# Patient Record
Sex: Female | Born: 1935
Health system: Southern US, Community
[De-identification: ages and names within clinical notes are randomized; demographics above are authoritative.]

## PROBLEM LIST (undated history)

## (undated) DIAGNOSIS — Z8603 Personal history of neoplasm of uncertain behavior: Secondary | ICD-10-CM

## (undated) DIAGNOSIS — I8390 Asymptomatic varicose veins of unspecified lower extremity: Secondary | ICD-10-CM

## (undated) DIAGNOSIS — Z8673 Personal history of transient ischemic attack (TIA), and cerebral infarction without residual deficits: Secondary | ICD-10-CM

## (undated) DIAGNOSIS — D069 Carcinoma in situ of cervix, unspecified: Secondary | ICD-10-CM

## (undated) DIAGNOSIS — E790 Hyperuricemia without signs of inflammatory arthritis and tophaceous disease: Secondary | ICD-10-CM

## (undated) DIAGNOSIS — R296 Repeated falls: Secondary | ICD-10-CM

## (undated) DIAGNOSIS — C189 Malignant neoplasm of colon, unspecified: Secondary | ICD-10-CM

## (undated) DIAGNOSIS — C541 Malignant neoplasm of endometrium: Secondary | ICD-10-CM

## (undated) DIAGNOSIS — C50919 Malignant neoplasm of unspecified site of unspecified female breast: Secondary | ICD-10-CM

## (undated) DIAGNOSIS — M1990 Unspecified osteoarthritis, unspecified site: Secondary | ICD-10-CM

## (undated) DIAGNOSIS — C4490 Unspecified malignant neoplasm of skin, unspecified: Secondary | ICD-10-CM

## (undated) DIAGNOSIS — E119 Type 2 diabetes mellitus without complications: Secondary | ICD-10-CM

## (undated) DIAGNOSIS — M7060 Trochanteric bursitis, unspecified hip: Secondary | ICD-10-CM

## (undated) DIAGNOSIS — I10 Essential (primary) hypertension: Secondary | ICD-10-CM

## (undated) DIAGNOSIS — Z862 Personal history of diseases of the blood and blood-forming organs and certain disorders involving the immune mechanism: Secondary | ICD-10-CM

## (undated) DIAGNOSIS — I219 Acute myocardial infarction, unspecified: Secondary | ICD-10-CM

## (undated) DIAGNOSIS — D45 Polycythemia vera: Secondary | ICD-10-CM

## (undated) DIAGNOSIS — Z8781 Personal history of (healed) traumatic fracture: Secondary | ICD-10-CM

## (undated) DIAGNOSIS — I639 Cerebral infarction, unspecified: Secondary | ICD-10-CM

## (undated) DIAGNOSIS — R809 Proteinuria, unspecified: Secondary | ICD-10-CM

## (undated) HISTORY — PX: EXPLORATORY LAPAROTOMY: SUR591

## (undated) HISTORY — PX: TONSILLECTOMY: SUR1361

## (undated) HISTORY — PX: CATARACT EXTRACTION W/ INTRAOCULAR LENS IMPLANT: SHX1309

## (undated) HISTORY — PX: ABDOMINAL HYSTERECTOMY: SHX81

## (undated) HISTORY — DX: Malignant neoplasm of colon, unspecified: C18.9

## (undated) HISTORY — DX: Cerebral infarction, unspecified: I63.9

## (undated) HISTORY — DX: Malignant neoplasm of unspecified site of unspecified female breast: C50.919

---

## 2004-06-19 ENCOUNTER — Ambulatory Visit: Payer: Self-pay | Admitting: Internal Medicine

## 2005-10-01 ENCOUNTER — Ambulatory Visit: Payer: Self-pay | Admitting: Internal Medicine

## 2006-06-22 DIAGNOSIS — D45 Polycythemia vera: Secondary | ICD-10-CM | POA: Insufficient documentation

## 2006-06-22 DIAGNOSIS — I639 Cerebral infarction, unspecified: Secondary | ICD-10-CM

## 2006-06-22 HISTORY — DX: Cerebral infarction, unspecified: I63.9

## 2006-09-02 ENCOUNTER — Other Ambulatory Visit: Payer: Self-pay

## 2006-09-02 ENCOUNTER — Inpatient Hospital Stay: Payer: Self-pay | Admitting: Unknown Physician Specialty

## 2006-09-24 ENCOUNTER — Ambulatory Visit: Payer: Self-pay | Admitting: Obstetrics and Gynecology

## 2006-10-01 ENCOUNTER — Ambulatory Visit: Payer: Self-pay | Admitting: Obstetrics and Gynecology

## 2006-10-12 ENCOUNTER — Ambulatory Visit: Payer: Self-pay | Admitting: Obstetrics and Gynecology

## 2006-10-15 ENCOUNTER — Inpatient Hospital Stay: Payer: Self-pay | Admitting: Obstetrics and Gynecology

## 2006-10-17 ENCOUNTER — Ambulatory Visit: Payer: Self-pay | Admitting: Internal Medicine

## 2006-10-21 ENCOUNTER — Ambulatory Visit: Payer: Self-pay | Admitting: Internal Medicine

## 2006-10-22 ENCOUNTER — Ambulatory Visit: Payer: Self-pay | Admitting: Internal Medicine

## 2006-11-21 ENCOUNTER — Ambulatory Visit: Payer: Self-pay | Admitting: Internal Medicine

## 2006-12-21 ENCOUNTER — Ambulatory Visit: Payer: Self-pay | Admitting: Internal Medicine

## 2006-12-29 ENCOUNTER — Ambulatory Visit: Payer: Self-pay | Admitting: Obstetrics and Gynecology

## 2007-01-04 ENCOUNTER — Inpatient Hospital Stay: Payer: Self-pay | Admitting: Obstetrics and Gynecology

## 2007-01-13 ENCOUNTER — Inpatient Hospital Stay: Payer: Self-pay | Admitting: Internal Medicine

## 2007-01-13 ENCOUNTER — Other Ambulatory Visit: Payer: Self-pay

## 2007-01-21 ENCOUNTER — Ambulatory Visit: Payer: Self-pay | Admitting: Internal Medicine

## 2007-02-21 ENCOUNTER — Ambulatory Visit: Payer: Self-pay | Admitting: Internal Medicine

## 2007-03-23 ENCOUNTER — Ambulatory Visit: Payer: Self-pay | Admitting: Internal Medicine

## 2007-04-23 ENCOUNTER — Ambulatory Visit: Payer: Self-pay | Admitting: Internal Medicine

## 2007-05-23 ENCOUNTER — Ambulatory Visit: Payer: Self-pay | Admitting: Internal Medicine

## 2007-06-23 ENCOUNTER — Ambulatory Visit: Payer: Self-pay | Admitting: Internal Medicine

## 2007-07-24 ENCOUNTER — Ambulatory Visit: Payer: Self-pay | Admitting: Internal Medicine

## 2007-07-26 ENCOUNTER — Ambulatory Visit: Payer: Self-pay | Admitting: Gastroenterology

## 2007-08-21 ENCOUNTER — Ambulatory Visit: Payer: Self-pay | Admitting: Internal Medicine

## 2007-09-21 ENCOUNTER — Ambulatory Visit: Payer: Self-pay | Admitting: Internal Medicine

## 2007-10-27 ENCOUNTER — Ambulatory Visit: Payer: Self-pay | Admitting: Internal Medicine

## 2007-11-07 ENCOUNTER — Ambulatory Visit: Payer: Self-pay | Admitting: Internal Medicine

## 2007-11-21 ENCOUNTER — Ambulatory Visit: Payer: Self-pay | Admitting: Internal Medicine

## 2007-12-21 ENCOUNTER — Ambulatory Visit: Payer: Self-pay | Admitting: Internal Medicine

## 2008-01-21 ENCOUNTER — Ambulatory Visit: Payer: Self-pay | Admitting: Internal Medicine

## 2008-02-21 ENCOUNTER — Ambulatory Visit: Payer: Self-pay | Admitting: Internal Medicine

## 2008-03-22 ENCOUNTER — Ambulatory Visit: Payer: Self-pay | Admitting: Internal Medicine

## 2008-04-22 ENCOUNTER — Ambulatory Visit: Payer: Self-pay | Admitting: Internal Medicine

## 2008-05-22 ENCOUNTER — Ambulatory Visit: Payer: Self-pay | Admitting: Internal Medicine

## 2008-06-22 ENCOUNTER — Ambulatory Visit: Payer: Self-pay | Admitting: Internal Medicine

## 2008-07-23 ENCOUNTER — Ambulatory Visit: Payer: Self-pay | Admitting: Internal Medicine

## 2008-08-20 ENCOUNTER — Ambulatory Visit: Payer: Self-pay | Admitting: Internal Medicine

## 2008-09-25 ENCOUNTER — Ambulatory Visit: Payer: Self-pay | Admitting: Internal Medicine

## 2008-10-20 ENCOUNTER — Ambulatory Visit: Payer: Self-pay | Admitting: Internal Medicine

## 2008-11-20 ENCOUNTER — Ambulatory Visit: Payer: Self-pay | Admitting: Internal Medicine

## 2008-11-28 ENCOUNTER — Ambulatory Visit: Payer: Self-pay | Admitting: Internal Medicine

## 2008-12-19 ENCOUNTER — Ambulatory Visit: Payer: Self-pay | Admitting: Internal Medicine

## 2008-12-20 ENCOUNTER — Ambulatory Visit: Payer: Self-pay | Admitting: Internal Medicine

## 2009-01-29 ENCOUNTER — Ambulatory Visit: Payer: Self-pay | Admitting: Internal Medicine

## 2009-02-20 ENCOUNTER — Ambulatory Visit: Payer: Self-pay | Admitting: Internal Medicine

## 2009-03-22 ENCOUNTER — Ambulatory Visit: Payer: Self-pay | Admitting: Internal Medicine

## 2009-04-22 ENCOUNTER — Ambulatory Visit: Payer: Self-pay | Admitting: Internal Medicine

## 2009-04-23 ENCOUNTER — Ambulatory Visit: Payer: Self-pay | Admitting: Internal Medicine

## 2009-05-22 ENCOUNTER — Ambulatory Visit: Payer: Self-pay | Admitting: Internal Medicine

## 2009-06-22 ENCOUNTER — Ambulatory Visit: Payer: Self-pay | Admitting: Internal Medicine

## 2009-07-23 ENCOUNTER — Ambulatory Visit: Payer: Self-pay | Admitting: Internal Medicine

## 2009-08-27 ENCOUNTER — Ambulatory Visit: Payer: Self-pay | Admitting: Internal Medicine

## 2009-09-20 ENCOUNTER — Ambulatory Visit: Payer: Self-pay | Admitting: Internal Medicine

## 2009-10-20 ENCOUNTER — Ambulatory Visit: Payer: Self-pay | Admitting: Internal Medicine

## 2009-12-02 ENCOUNTER — Ambulatory Visit: Payer: Self-pay | Admitting: Internal Medicine

## 2009-12-31 ENCOUNTER — Ambulatory Visit: Payer: Self-pay | Admitting: Internal Medicine

## 2010-01-20 ENCOUNTER — Ambulatory Visit: Payer: Self-pay | Admitting: Internal Medicine

## 2010-02-20 ENCOUNTER — Ambulatory Visit: Payer: Self-pay | Admitting: Internal Medicine

## 2010-03-22 ENCOUNTER — Ambulatory Visit: Payer: Self-pay | Admitting: Internal Medicine

## 2010-03-26 ENCOUNTER — Ambulatory Visit: Payer: Self-pay | Admitting: Internal Medicine

## 2010-04-22 ENCOUNTER — Ambulatory Visit: Payer: Self-pay | Admitting: Internal Medicine

## 2010-05-26 ENCOUNTER — Ambulatory Visit: Payer: Self-pay | Admitting: Internal Medicine

## 2010-06-22 ENCOUNTER — Ambulatory Visit: Payer: Self-pay | Admitting: Internal Medicine

## 2010-07-28 ENCOUNTER — Ambulatory Visit: Payer: Self-pay | Admitting: Internal Medicine

## 2010-08-21 ENCOUNTER — Ambulatory Visit: Payer: Self-pay | Admitting: Internal Medicine

## 2010-10-06 ENCOUNTER — Ambulatory Visit: Payer: Self-pay | Admitting: Internal Medicine

## 2010-10-21 ENCOUNTER — Ambulatory Visit: Payer: Self-pay | Admitting: Internal Medicine

## 2010-12-08 ENCOUNTER — Ambulatory Visit: Payer: Self-pay | Admitting: Internal Medicine

## 2010-12-21 ENCOUNTER — Ambulatory Visit: Payer: Self-pay | Admitting: Internal Medicine

## 2011-02-09 ENCOUNTER — Ambulatory Visit: Payer: Self-pay | Admitting: Internal Medicine

## 2011-02-21 ENCOUNTER — Ambulatory Visit: Payer: Self-pay | Admitting: Internal Medicine

## 2011-03-20 ENCOUNTER — Ambulatory Visit: Payer: Self-pay | Admitting: Internal Medicine

## 2011-04-06 ENCOUNTER — Ambulatory Visit: Payer: Self-pay | Admitting: Internal Medicine

## 2011-04-23 ENCOUNTER — Ambulatory Visit: Payer: Self-pay | Admitting: Internal Medicine

## 2011-06-01 ENCOUNTER — Ambulatory Visit: Payer: Self-pay | Admitting: Internal Medicine

## 2011-06-23 ENCOUNTER — Ambulatory Visit: Payer: Self-pay | Admitting: Internal Medicine

## 2011-07-27 ENCOUNTER — Ambulatory Visit: Payer: Self-pay | Admitting: Internal Medicine

## 2011-07-27 LAB — CBC CANCER CENTER
Basophil #: 0 x10 3/mm (ref 0.0–0.1)
Basophil %: 0.5 %
Eosinophil #: 0.1 x10 3/mm (ref 0.0–0.7)
Eosinophil %: 1.7 %
HCT: 41.4 % (ref 35.0–47.0)
HGB: 14 g/dL (ref 12.0–16.0)
Lymphocyte #: 1.2 x10 3/mm (ref 1.0–3.6)
Lymphocyte %: 15.9 %
MCH: 30.2 pg (ref 26.0–34.0)
MCHC: 33.8 g/dL (ref 32.0–36.0)
MCV: 89 fL (ref 80–100)
Monocyte #: 0.7 x10 3/mm (ref 0.0–0.7)
Monocyte %: 8.4 %
Neutrophil #: 5.8 x10 3/mm (ref 1.4–6.5)
Neutrophil %: 73.5 %
Platelet: 499 x10 3/mm — ABNORMAL HIGH (ref 150–440)
RBC: 4.63 10*6/uL (ref 3.80–5.20)
RDW: 15.5 % — ABNORMAL HIGH (ref 11.5–14.5)
WBC: 7.8 x10 3/mm (ref 3.6–11.0)

## 2011-08-21 ENCOUNTER — Ambulatory Visit: Payer: Self-pay | Admitting: Internal Medicine

## 2011-10-02 ENCOUNTER — Ambulatory Visit: Payer: Self-pay | Admitting: Internal Medicine

## 2011-10-02 LAB — CBC CANCER CENTER
Basophil #: 0.1 x10 3/mm (ref 0.0–0.1)
Basophil %: 2.1 %
Eosinophil #: 0.3 x10 3/mm (ref 0.0–0.7)
Eosinophil %: 3.9 %
HCT: 42.7 % (ref 35.0–47.0)
HGB: 14.2 g/dL (ref 12.0–16.0)
Lymphocyte #: 1.3 x10 3/mm (ref 1.0–3.6)
Lymphocyte %: 19.8 %
MCH: 29.8 pg (ref 26.0–34.0)
MCHC: 33.3 g/dL (ref 32.0–36.0)
MCV: 90 fL (ref 80–100)
Monocyte #: 0.7 x10 3/mm (ref 0.2–0.9)
Monocyte %: 9.6 %
Neutrophil #: 4.4 x10 3/mm (ref 1.4–6.5)
Neutrophil %: 64.6 %
Platelet: 446 x10 3/mm — ABNORMAL HIGH (ref 150–440)
RBC: 4.76 10*6/uL (ref 3.80–5.20)
RDW: 15 % — ABNORMAL HIGH (ref 11.5–14.5)
WBC: 6.8 x10 3/mm (ref 3.6–11.0)

## 2011-10-02 LAB — IRON AND TIBC
Iron Bind.Cap.(Total): 436 ug/dL (ref 250–450)
Iron Saturation: 15 %
Iron: 64 ug/dL (ref 50–170)
Unbound Iron-Bind.Cap.: 372 ug/dL

## 2011-10-02 LAB — FERRITIN: Ferritin (ARMC): 9 ng/mL (ref 8–388)

## 2011-10-12 LAB — CBC CANCER CENTER
Basophil #: 0.1 x10 3/mm (ref 0.0–0.1)
Basophil %: 1.4 %
Eosinophil #: 0.2 x10 3/mm (ref 0.0–0.7)
Eosinophil %: 3.6 %
HCT: 42.8 % (ref 35.0–47.0)
HGB: 14.1 g/dL (ref 12.0–16.0)
Lymphocyte #: 1.2 x10 3/mm (ref 1.0–3.6)
Lymphocyte %: 20.8 %
MCH: 29.5 pg (ref 26.0–34.0)
MCHC: 33 g/dL (ref 32.0–36.0)
MCV: 90 fL (ref 80–100)
Monocyte #: 0.5 x10 3/mm (ref 0.2–0.9)
Monocyte %: 9.2 %
Neutrophil #: 3.8 x10 3/mm (ref 1.4–6.5)
Neutrophil %: 65 %
Platelet: 480 x10 3/mm — ABNORMAL HIGH (ref 150–440)
RBC: 4.78 10*6/uL (ref 3.80–5.20)
RDW: 15.4 % — ABNORMAL HIGH (ref 11.5–14.5)
WBC: 5.8 x10 3/mm (ref 3.6–11.0)

## 2011-10-19 LAB — CANCER CENTER HEMATOCRIT: HCT: 40.8 % (ref 35.0–47.0)

## 2011-10-21 ENCOUNTER — Ambulatory Visit: Payer: Self-pay | Admitting: Internal Medicine

## 2011-11-02 LAB — CBC CANCER CENTER
Basophil #: 0 x10 3/mm (ref 0.0–0.1)
Basophil %: 0.4 %
Eosinophil #: 0.2 x10 3/mm (ref 0.0–0.7)
Eosinophil %: 2.5 %
HCT: 42 % (ref 35.0–47.0)
HGB: 13.9 g/dL (ref 12.0–16.0)
Lymphocyte #: 1.1 x10 3/mm (ref 1.0–3.6)
Lymphocyte %: 14.4 %
MCH: 29.2 pg (ref 26.0–34.0)
MCHC: 33 g/dL (ref 32.0–36.0)
MCV: 89 fL (ref 80–100)
Monocyte #: 0.6 x10 3/mm (ref 0.2–0.9)
Monocyte %: 7.6 %
Neutrophil #: 5.6 x10 3/mm (ref 1.4–6.5)
Neutrophil %: 75.1 %
Platelet: 414 x10 3/mm (ref 150–440)
RBC: 4.74 10*6/uL (ref 3.80–5.20)
RDW: 14.6 % — ABNORMAL HIGH (ref 11.5–14.5)
WBC: 7.5 x10 3/mm (ref 3.6–11.0)

## 2011-11-21 ENCOUNTER — Ambulatory Visit: Payer: Self-pay | Admitting: Internal Medicine

## 2011-11-23 LAB — CBC CANCER CENTER
Basophil #: 0.1 x10 3/mm (ref 0.0–0.1)
Basophil %: 1.3 %
Eosinophil #: 0.2 x10 3/mm (ref 0.0–0.7)
Eosinophil %: 2.9 %
HCT: 40 % (ref 35.0–47.0)
HGB: 12.9 g/dL (ref 12.0–16.0)
Lymphocyte #: 1.2 x10 3/mm (ref 1.0–3.6)
Lymphocyte %: 19.3 %
MCH: 28.3 pg (ref 26.0–34.0)
MCHC: 32.2 g/dL (ref 32.0–36.0)
MCV: 88 fL (ref 80–100)
Monocyte #: 0.5 x10 3/mm (ref 0.2–0.9)
Monocyte %: 8 %
Neutrophil #: 4.2 x10 3/mm (ref 1.4–6.5)
Neutrophil %: 68.5 %
Platelet: 397 x10 3/mm (ref 150–440)
RBC: 4.55 10*6/uL (ref 3.80–5.20)
RDW: 14.6 % — ABNORMAL HIGH (ref 11.5–14.5)
WBC: 6.2 x10 3/mm (ref 3.6–11.0)

## 2011-12-14 LAB — CBC CANCER CENTER
Basophil #: 0.1 x10 3/mm (ref 0.0–0.1)
Basophil %: 1.4 %
Eosinophil #: 0.2 x10 3/mm (ref 0.0–0.7)
Eosinophil %: 2.8 %
HCT: 40.4 % (ref 35.0–47.0)
HGB: 13.1 g/dL (ref 12.0–16.0)
Lymphocyte #: 1 x10 3/mm (ref 1.0–3.6)
Lymphocyte %: 14.5 %
MCH: 27.9 pg (ref 26.0–34.0)
MCHC: 32.3 g/dL (ref 32.0–36.0)
MCV: 86 fL (ref 80–100)
Monocyte #: 0.5 x10 3/mm (ref 0.2–0.9)
Monocyte %: 7.9 %
Neutrophil #: 4.9 x10 3/mm (ref 1.4–6.5)
Neutrophil %: 73.4 %
Platelet: 448 x10 3/mm — ABNORMAL HIGH (ref 150–440)
RBC: 4.68 10*6/uL (ref 3.80–5.20)
RDW: 14.6 % — ABNORMAL HIGH (ref 11.5–14.5)
WBC: 6.7 x10 3/mm (ref 3.6–11.0)

## 2011-12-21 ENCOUNTER — Ambulatory Visit: Payer: Self-pay | Admitting: Internal Medicine

## 2012-01-04 ENCOUNTER — Ambulatory Visit: Payer: Self-pay | Admitting: Gastroenterology

## 2012-01-07 ENCOUNTER — Ambulatory Visit: Payer: Self-pay | Admitting: Internal Medicine

## 2012-01-07 LAB — CBC CANCER CENTER
Basophil #: 0.1 x10 3/mm (ref 0.0–0.1)
Basophil %: 1.2 %
Eosinophil #: 0.3 x10 3/mm (ref 0.0–0.7)
Eosinophil %: 4.4 %
HCT: 40.1 % (ref 35.0–47.0)
HGB: 13.1 g/dL (ref 12.0–16.0)
Lymphocyte #: 1.5 x10 3/mm (ref 1.0–3.6)
Lymphocyte %: 19.1 %
MCH: 27.7 pg (ref 26.0–34.0)
MCHC: 32.6 g/dL (ref 32.0–36.0)
MCV: 85 fL (ref 80–100)
Monocyte #: 0.7 x10 3/mm (ref 0.2–0.9)
Monocyte %: 8.4 %
Neutrophil #: 5.2 x10 3/mm (ref 1.4–6.5)
Neutrophil %: 66.9 %
Platelet: 427 x10 3/mm (ref 150–440)
RBC: 4.72 10*6/uL (ref 3.80–5.20)
RDW: 15.2 % — ABNORMAL HIGH (ref 11.5–14.5)
WBC: 7.8 x10 3/mm (ref 3.6–11.0)

## 2012-01-21 ENCOUNTER — Ambulatory Visit: Payer: Self-pay | Admitting: Internal Medicine

## 2012-01-29 LAB — CBC CANCER CENTER
Basophil #: 0.1 x10 3/mm (ref 0.0–0.1)
Basophil %: 1.2 %
Eosinophil #: 0.3 x10 3/mm (ref 0.0–0.7)
Eosinophil %: 4 %
HCT: 37.8 % (ref 35.0–47.0)
HGB: 12.7 g/dL (ref 12.0–16.0)
Lymphocyte #: 1.5 x10 3/mm (ref 1.0–3.6)
Lymphocyte %: 20.2 %
MCH: 28.1 pg (ref 26.0–34.0)
MCHC: 33.5 g/dL (ref 32.0–36.0)
MCV: 84 fL (ref 80–100)
Monocyte #: 0.7 x10 3/mm (ref 0.2–0.9)
Monocyte %: 9.3 %
Neutrophil #: 4.7 x10 3/mm (ref 1.4–6.5)
Neutrophil %: 65.3 %
Platelet: 467 x10 3/mm — ABNORMAL HIGH (ref 150–440)
RBC: 4.51 10*6/uL (ref 3.80–5.20)
RDW: 16 % — ABNORMAL HIGH (ref 11.5–14.5)
WBC: 7.2 x10 3/mm (ref 3.6–11.0)

## 2012-02-19 LAB — CBC CANCER CENTER
Basophil #: 0.1 x10 3/mm (ref 0.0–0.1)
Basophil %: 1.4 %
Eosinophil #: 0.3 x10 3/mm (ref 0.0–0.7)
Eosinophil %: 3.6 %
HCT: 41.4 % (ref 35.0–47.0)
HGB: 13.3 g/dL (ref 12.0–16.0)
Lymphocyte #: 1.3 x10 3/mm (ref 1.0–3.6)
Lymphocyte %: 18 %
MCH: 27 pg (ref 26.0–34.0)
MCHC: 32.1 g/dL (ref 32.0–36.0)
MCV: 84 fL (ref 80–100)
Monocyte #: 0.7 x10 3/mm (ref 0.2–0.9)
Monocyte %: 9.9 %
Neutrophil #: 4.9 x10 3/mm (ref 1.4–6.5)
Neutrophil %: 67.1 %
Platelet: 456 x10 3/mm — ABNORMAL HIGH (ref 150–440)
RBC: 4.92 10*6/uL (ref 3.80–5.20)
RDW: 16.2 % — ABNORMAL HIGH (ref 11.5–14.5)
WBC: 7.3 x10 3/mm (ref 3.6–11.0)

## 2012-02-19 LAB — CREATININE, SERUM
Creatinine: 1.01 mg/dL (ref 0.60–1.30)
EGFR (African American): >60
EGFR (Non-African Amer.): 53 — ABNORMAL LOW

## 2012-02-19 LAB — POTASSIUM: Potassium: 3.7 mmol/L (ref 3.5–5.1)

## 2012-02-19 LAB — CALCIUM: Calcium, Total: 9.2 mg/dL (ref 8.5–10.1)

## 2012-02-19 LAB — MAGNESIUM: Magnesium: 1.9 mg/dL

## 2012-02-21 ENCOUNTER — Ambulatory Visit: Payer: Self-pay | Admitting: Internal Medicine

## 2012-03-04 ENCOUNTER — Ambulatory Visit: Payer: Self-pay | Admitting: Internal Medicine

## 2012-03-04 LAB — CANCER CENTER HEMATOCRIT: HCT: 39.5 % (ref 35.0–47.0)

## 2012-03-16 LAB — CBC CANCER CENTER
Basophil #: 0 x10 3/mm (ref 0.0–0.1)
Basophil %: 0.3 %
Eosinophil #: 0.3 x10 3/mm (ref 0.0–0.7)
Eosinophil %: 3.8 %
HCT: 39 % (ref 35.0–47.0)
HGB: 12.5 g/dL (ref 12.0–16.0)
Lymphocyte #: 1.5 x10 3/mm (ref 1.0–3.6)
Lymphocyte %: 18.9 %
MCH: 26.7 pg (ref 26.0–34.0)
MCHC: 32.2 g/dL (ref 32.0–36.0)
MCV: 83 fL (ref 80–100)
Monocyte #: 0.8 x10 3/mm (ref 0.2–0.9)
Monocyte %: 9.2 %
Neutrophil #: 5.5 x10 3/mm (ref 1.4–6.5)
Neutrophil %: 67.8 %
Platelet: 456 x10 3/mm — ABNORMAL HIGH (ref 150–440)
RBC: 4.69 10*6/uL (ref 3.80–5.20)
RDW: 16.7 % — ABNORMAL HIGH (ref 11.5–14.5)
WBC: 8.2 x10 3/mm (ref 3.6–11.0)

## 2012-03-21 ENCOUNTER — Ambulatory Visit: Payer: Self-pay | Admitting: Internal Medicine

## 2012-03-22 ENCOUNTER — Ambulatory Visit: Payer: Self-pay | Admitting: Internal Medicine

## 2012-03-30 LAB — CBC CANCER CENTER
Basophil #: 0.1 x10 3/mm (ref 0.0–0.1)
Basophil %: 1.2 %
Eosinophil #: 0.2 x10 3/mm (ref 0.0–0.7)
Eosinophil %: 2.6 %
HCT: 38.5 % (ref 35.0–47.0)
HGB: 12.3 g/dL (ref 12.0–16.0)
Lymphocyte #: 1.7 x10 3/mm (ref 1.0–3.6)
Lymphocyte %: 21.1 %
MCH: 26.4 pg (ref 26.0–34.0)
MCHC: 31.8 g/dL — ABNORMAL LOW (ref 32.0–36.0)
MCV: 83 fL (ref 80–100)
Monocyte #: 0.8 x10 3/mm (ref 0.2–0.9)
Monocyte %: 10.4 %
Neutrophil #: 5.2 x10 3/mm (ref 1.4–6.5)
Neutrophil %: 64.7 %
Platelet: 464 x10 3/mm — ABNORMAL HIGH (ref 150–440)
RBC: 4.64 10*6/uL (ref 3.80–5.20)
RDW: 16.2 % — ABNORMAL HIGH (ref 11.5–14.5)
WBC: 8.1 x10 3/mm (ref 3.6–11.0)

## 2012-04-22 ENCOUNTER — Ambulatory Visit: Payer: Self-pay | Admitting: Internal Medicine

## 2012-04-22 LAB — CBC CANCER CENTER
Basophil #: 0.1 x10 3/mm (ref 0.0–0.1)
Basophil %: 1.7 %
Eosinophil #: 0.3 x10 3/mm (ref 0.0–0.7)
Eosinophil %: 3.7 %
HCT: 38.9 % (ref 35.0–47.0)
HGB: 12.3 g/dL (ref 12.0–16.0)
Lymphocyte #: 1.5 x10 3/mm (ref 1.0–3.6)
Lymphocyte %: 20.1 %
MCH: 26.1 pg (ref 26.0–34.0)
MCHC: 31.7 g/dL — ABNORMAL LOW (ref 32.0–36.0)
MCV: 82 fL (ref 80–100)
Monocyte #: 0.7 x10 3/mm (ref 0.2–0.9)
Monocyte %: 9.9 %
Neutrophil #: 4.8 x10 3/mm (ref 1.4–6.5)
Neutrophil %: 64.6 %
Platelet: 430 x10 3/mm (ref 150–440)
RBC: 4.72 10*6/uL (ref 3.80–5.20)
RDW: 15.7 % — ABNORMAL HIGH (ref 11.5–14.5)
WBC: 7.4 x10 3/mm (ref 3.6–11.0)

## 2012-05-20 LAB — CBC CANCER CENTER
Basophil #: 0.1 x10 3/mm (ref 0.0–0.1)
Basophil %: 1.3 %
Eosinophil #: 0.2 x10 3/mm (ref 0.0–0.7)
Eosinophil %: 3.2 %
HCT: 38.1 % (ref 35.0–47.0)
HGB: 12.4 g/dL (ref 12.0–16.0)
Lymphocyte #: 1.1 x10 3/mm (ref 1.0–3.6)
Lymphocyte %: 15.7 %
MCH: 26.4 pg (ref 26.0–34.0)
MCHC: 32.6 g/dL (ref 32.0–36.0)
MCV: 81 fL (ref 80–100)
Monocyte #: 0.5 x10 3/mm (ref 0.2–0.9)
Monocyte %: 7.5 %
Neutrophil #: 5.2 x10 3/mm (ref 1.4–6.5)
Neutrophil %: 72.3 %
Platelet: 429 x10 3/mm (ref 150–440)
RBC: 4.71 10*6/uL (ref 3.80–5.20)
RDW: 16.5 % — ABNORMAL HIGH (ref 11.5–14.5)
WBC: 7.3 x10 3/mm (ref 3.6–11.0)

## 2012-05-22 ENCOUNTER — Ambulatory Visit: Payer: Self-pay | Admitting: Internal Medicine

## 2012-06-10 LAB — CANCER CENTER HEMATOCRIT: HCT: 39.5 % (ref 35.0–47.0)

## 2012-06-22 ENCOUNTER — Ambulatory Visit: Payer: Self-pay | Admitting: Internal Medicine

## 2012-06-22 DIAGNOSIS — Z8781 Personal history of (healed) traumatic fracture: Secondary | ICD-10-CM

## 2012-06-22 HISTORY — DX: Personal history of (healed) traumatic fracture: Z87.81

## 2012-07-08 LAB — CBC CANCER CENTER
Basophil #: 0.1 x10 3/mm (ref 0.0–0.1)
Basophil %: 1.3 %
Eosinophil #: 0.3 x10 3/mm (ref 0.0–0.7)
Eosinophil %: 3.8 %
HCT: 40.3 % (ref 35.0–47.0)
HGB: 13.2 g/dL (ref 12.0–16.0)
Lymphocyte #: 1.5 x10 3/mm (ref 1.0–3.6)
Lymphocyte %: 18.4 %
MCH: 26.3 pg (ref 26.0–34.0)
MCHC: 32.6 g/dL (ref 32.0–36.0)
MCV: 81 fL (ref 80–100)
Monocyte #: 0.7 x10 3/mm (ref 0.2–0.9)
Monocyte %: 9.3 %
Neutrophil #: 5.3 x10 3/mm (ref 1.4–6.5)
Neutrophil %: 67.2 %
Platelet: 455 x10 3/mm — ABNORMAL HIGH (ref 150–440)
RBC: 5 10*6/uL (ref 3.80–5.20)
RDW: 17.1 % — ABNORMAL HIGH (ref 11.5–14.5)
WBC: 7.9 x10 3/mm (ref 3.6–11.0)

## 2012-07-23 ENCOUNTER — Ambulatory Visit: Payer: Self-pay | Admitting: Internal Medicine

## 2012-08-11 LAB — CBC CANCER CENTER
Basophil #: 0 x10 3/mm (ref 0.0–0.1)
Basophil %: 0.5 %
Eosinophil #: 0.3 x10 3/mm (ref 0.0–0.7)
Eosinophil %: 3.5 %
HCT: 38.4 % (ref 35.0–47.0)
HGB: 12.6 g/dL (ref 12.0–16.0)
Lymphocyte #: 1.5 x10 3/mm (ref 1.0–3.6)
Lymphocyte %: 16.4 %
MCH: 26 pg (ref 26.0–34.0)
MCHC: 32.7 g/dL (ref 32.0–36.0)
MCV: 80 fL (ref 80–100)
Monocyte #: 0.7 x10 3/mm (ref 0.2–0.9)
Monocyte %: 7.1 %
Neutrophil #: 6.7 x10 3/mm — ABNORMAL HIGH (ref 1.4–6.5)
Neutrophil %: 72.5 %
Platelet: 463 x10 3/mm — ABNORMAL HIGH (ref 150–440)
RBC: 4.83 10*6/uL (ref 3.80–5.20)
RDW: 16.8 % — ABNORMAL HIGH (ref 11.5–14.5)
WBC: 9.3 x10 3/mm (ref 3.6–11.0)

## 2012-08-20 ENCOUNTER — Ambulatory Visit: Payer: Self-pay | Admitting: Internal Medicine

## 2012-09-12 LAB — CBC CANCER CENTER
Basophil #: 0.1 x10 3/mm (ref 0.0–0.1)
Basophil %: 1.2 %
Eosinophil #: 0.2 x10 3/mm (ref 0.0–0.7)
Eosinophil %: 2.5 %
HCT: 39.8 % (ref 35.0–47.0)
HGB: 12.6 g/dL (ref 12.0–16.0)
Lymphocyte #: 1.4 x10 3/mm (ref 1.0–3.6)
Lymphocyte %: 17.5 %
MCH: 25.4 pg — ABNORMAL LOW (ref 26.0–34.0)
MCHC: 31.7 g/dL — ABNORMAL LOW (ref 32.0–36.0)
MCV: 80 fL (ref 80–100)
Monocyte #: 0.7 x10 3/mm (ref 0.2–0.9)
Monocyte %: 9.1 %
Neutrophil #: 5.5 x10 3/mm (ref 1.4–6.5)
Neutrophil %: 69.7 %
Platelet: 494 x10 3/mm — ABNORMAL HIGH (ref 150–440)
RBC: 4.97 10*6/uL (ref 3.80–5.20)
RDW: 16.5 % — ABNORMAL HIGH (ref 11.5–14.5)
WBC: 7.9 x10 3/mm (ref 3.6–11.0)

## 2012-09-20 ENCOUNTER — Ambulatory Visit: Payer: Self-pay | Admitting: Internal Medicine

## 2012-10-10 LAB — CBC CANCER CENTER
Basophil #: 0.1 x10 3/mm (ref 0.0–0.1)
Basophil %: 1.4 %
Eosinophil #: 0.2 x10 3/mm (ref 0.0–0.7)
Eosinophil %: 2.8 %
HCT: 39.9 % (ref 35.0–47.0)
HGB: 12.8 g/dL (ref 12.0–16.0)
Lymphocyte #: 1.2 x10 3/mm (ref 1.0–3.6)
Lymphocyte %: 13.8 %
MCH: 25.5 pg — ABNORMAL LOW (ref 26.0–34.0)
MCHC: 32 g/dL (ref 32.0–36.0)
MCV: 80 fL (ref 80–100)
Monocyte #: 0.8 x10 3/mm (ref 0.2–0.9)
Monocyte %: 9.1 %
Neutrophil #: 6.3 x10 3/mm (ref 1.4–6.5)
Neutrophil %: 72.9 %
Platelet: 456 x10 3/mm — ABNORMAL HIGH (ref 150–440)
RBC: 5.02 10*6/uL (ref 3.80–5.20)
RDW: 16.5 % — ABNORMAL HIGH (ref 11.5–14.5)
WBC: 8.7 x10 3/mm (ref 3.6–11.0)

## 2012-10-20 ENCOUNTER — Ambulatory Visit: Payer: Self-pay | Admitting: Internal Medicine

## 2012-11-07 LAB — CBC CANCER CENTER
Basophil #: 0.1 x10 3/mm (ref 0.0–0.1)
Basophil %: 1.5 %
Eosinophil #: 0.2 x10 3/mm (ref 0.0–0.7)
Eosinophil %: 2.8 %
HCT: 38.8 % (ref 35.0–47.0)
HGB: 12.6 g/dL (ref 12.0–16.0)
Lymphocyte #: 1.2 x10 3/mm (ref 1.0–3.6)
Lymphocyte %: 15.4 %
MCH: 25.8 pg — ABNORMAL LOW (ref 26.0–34.0)
MCHC: 32.6 g/dL (ref 32.0–36.0)
MCV: 79 fL — ABNORMAL LOW (ref 80–100)
Monocyte #: 0.6 x10 3/mm (ref 0.2–0.9)
Monocyte %: 8.4 %
Neutrophil #: 5.4 x10 3/mm (ref 1.4–6.5)
Neutrophil %: 71.9 %
Platelet: 444 x10 3/mm — ABNORMAL HIGH (ref 150–440)
RBC: 4.89 10*6/uL (ref 3.80–5.20)
RDW: 16.5 % — ABNORMAL HIGH (ref 11.5–14.5)
WBC: 7.5 x10 3/mm (ref 3.6–11.0)

## 2012-11-10 ENCOUNTER — Emergency Department: Payer: Self-pay | Admitting: Emergency Medicine

## 2012-11-10 LAB — CBC
HCT: 33.8 % — ABNORMAL LOW (ref 35.0–47.0)
HGB: 11 g/dL — ABNORMAL LOW (ref 12.0–16.0)
MCH: 25.7 pg — ABNORMAL LOW (ref 26.0–34.0)
MCHC: 32.5 g/dL (ref 32.0–36.0)
MCV: 79 fL — ABNORMAL LOW (ref 80–100)
Platelet: 474 10*3/uL — ABNORMAL HIGH (ref 150–440)
RBC: 4.29 10*6/uL (ref 3.80–5.20)
RDW: 16.8 % — ABNORMAL HIGH (ref 11.5–14.5)
WBC: 8.5 10*3/uL (ref 3.6–11.0)

## 2012-11-10 LAB — COMPREHENSIVE METABOLIC PANEL
Albumin: 3.5 g/dL (ref 3.4–5.0)
Alkaline Phosphatase: 54 U/L (ref 50–136)
Anion Gap: 6 — ABNORMAL LOW (ref 7–16)
BUN: 14 mg/dL (ref 7–18)
Bilirubin,Total: 0.4 mg/dL (ref 0.2–1.0)
Calcium, Total: 9.1 mg/dL (ref 8.5–10.1)
Chloride: 97 mmol/L — ABNORMAL LOW (ref 98–107)
Co2: 28 mmol/L (ref 21–32)
Creatinine: 0.86 mg/dL (ref 0.60–1.30)
EGFR (African American): >60
EGFR (Non-African Amer.): >60
Glucose: 130 mg/dL — ABNORMAL HIGH (ref 65–99)
Osmolality: 265 (ref 275–301)
Potassium: 3.9 mmol/L (ref 3.5–5.1)
SGOT(AST): 19 U/L (ref 15–37)
SGPT (ALT): 17 U/L (ref 12–78)
Sodium: 131 mmol/L — ABNORMAL LOW (ref 136–145)
Total Protein: 6.8 g/dL (ref 6.4–8.2)

## 2012-11-10 LAB — PROTIME-INR
INR: 1
Prothrombin Time: 13.4 secs (ref 11.5–14.7)

## 2012-11-10 LAB — URINALYSIS, COMPLETE
Bilirubin,UR: NEGATIVE
Blood: NEGATIVE
Glucose,UR: NEGATIVE mg/dL (ref 0–75)
Hyaline Cast: 4
Ketone: NEGATIVE
Nitrite: NEGATIVE
Ph: 5 (ref 4.5–8.0)
Protein: NEGATIVE
RBC,UR: 1 /HPF (ref 0–5)
Specific Gravity: 1.012 (ref 1.003–1.030)
Squamous Epithelial: 3
Transitional Epi: <1
WBC UR: 2 /HPF (ref 0–5)

## 2012-11-10 LAB — APTT: Activated PTT: 27.5 secs (ref 23.6–35.9)

## 2012-11-20 ENCOUNTER — Ambulatory Visit: Payer: Self-pay | Admitting: Internal Medicine

## 2012-12-19 LAB — CBC CANCER CENTER
Basophil #: 0.1 x10 3/mm (ref 0.0–0.1)
Basophil %: 1.1 %
Eosinophil #: 0.2 x10 3/mm (ref 0.0–0.7)
Eosinophil %: 2.5 %
HCT: 39.2 % (ref 35.0–47.0)
HGB: 13.3 g/dL (ref 12.0–16.0)
Lymphocyte #: 1.3 x10 3/mm (ref 1.0–3.6)
Lymphocyte %: 18 %
MCH: 27.9 pg (ref 26.0–34.0)
MCHC: 33.9 g/dL (ref 32.0–36.0)
MCV: 82 fL (ref 80–100)
Monocyte #: 0.8 x10 3/mm (ref 0.2–0.9)
Monocyte %: 10.7 %
Neutrophil #: 4.9 x10 3/mm (ref 1.4–6.5)
Neutrophil %: 67.7 %
Platelet: 513 x10 3/mm — ABNORMAL HIGH (ref 150–440)
RBC: 4.77 10*6/uL (ref 3.80–5.20)
RDW: 18.6 % — ABNORMAL HIGH (ref 11.5–14.5)
WBC: 7.3 x10 3/mm (ref 3.6–11.0)

## 2012-12-20 ENCOUNTER — Ambulatory Visit: Payer: Self-pay | Admitting: Internal Medicine

## 2013-01-27 ENCOUNTER — Ambulatory Visit: Payer: Self-pay | Admitting: Internal Medicine

## 2013-01-30 LAB — CBC CANCER CENTER
Basophil #: 0.1 x10 3/mm (ref 0.0–0.1)
Basophil %: 1.4 %
Eosinophil #: 0.2 x10 3/mm (ref 0.0–0.7)
Eosinophil %: 2.4 %
HCT: 40 % (ref 35.0–47.0)
HGB: 13.2 g/dL (ref 12.0–16.0)
Lymphocyte #: 1.6 x10 3/mm (ref 1.0–3.6)
Lymphocyte %: 18.5 %
MCH: 26.8 pg (ref 26.0–34.0)
MCHC: 33.1 g/dL (ref 32.0–36.0)
MCV: 81 fL (ref 80–100)
Monocyte #: 0.8 x10 3/mm (ref 0.2–0.9)
Monocyte %: 9.7 %
Neutrophil #: 5.7 x10 3/mm (ref 1.4–6.5)
Neutrophil %: 68 %
Platelet: 484 x10 3/mm — ABNORMAL HIGH (ref 150–440)
RBC: 4.94 10*6/uL (ref 3.80–5.20)
RDW: 17.3 % — ABNORMAL HIGH (ref 11.5–14.5)
WBC: 8.4 x10 3/mm (ref 3.6–11.0)

## 2013-02-20 ENCOUNTER — Ambulatory Visit: Payer: Self-pay | Admitting: Internal Medicine

## 2013-03-13 LAB — CBC CANCER CENTER
Basophil #: 0.2 x10 3/mm — ABNORMAL HIGH (ref 0.0–0.1)
Basophil %: 2.1 %
Eosinophil #: 0.3 x10 3/mm (ref 0.0–0.7)
Eosinophil %: 3.3 %
HCT: 41.5 % (ref 35.0–47.0)
HGB: 13.5 g/dL (ref 12.0–16.0)
Lymphocyte #: 1.6 x10 3/mm (ref 1.0–3.6)
Lymphocyte %: 17.6 %
MCH: 26.3 pg (ref 26.0–34.0)
MCHC: 32.6 g/dL (ref 32.0–36.0)
MCV: 81 fL (ref 80–100)
Monocyte #: 0.8 x10 3/mm (ref 0.2–0.9)
Monocyte %: 8.6 %
Neutrophil #: 6.3 x10 3/mm (ref 1.4–6.5)
Neutrophil %: 68.4 %
Platelet: 549 x10 3/mm — ABNORMAL HIGH (ref 150–440)
RBC: 5.13 10*6/uL (ref 3.80–5.20)
RDW: 16.4 % — ABNORMAL HIGH (ref 11.5–14.5)
WBC: 9.2 x10 3/mm (ref 3.6–11.0)

## 2013-03-22 ENCOUNTER — Ambulatory Visit: Payer: Self-pay | Admitting: Internal Medicine

## 2013-04-24 ENCOUNTER — Ambulatory Visit: Payer: Self-pay | Admitting: Internal Medicine

## 2013-04-24 LAB — CBC CANCER CENTER
Basophil #: 0 x10 3/mm (ref 0.0–0.1)
Basophil %: 0.3 %
Eosinophil #: 0.3 x10 3/mm (ref 0.0–0.7)
Eosinophil %: 2.9 %
HCT: 41.9 % (ref 35.0–47.0)
HGB: 13.4 g/dL (ref 12.0–16.0)
Lymphocyte #: 1.7 x10 3/mm (ref 1.0–3.6)
Lymphocyte %: 19.5 %
MCH: 25.9 pg — ABNORMAL LOW (ref 26.0–34.0)
MCHC: 32 g/dL (ref 32.0–36.0)
MCV: 81 fL (ref 80–100)
Monocyte #: 0.8 x10 3/mm (ref 0.2–0.9)
Monocyte %: 9.1 %
Neutrophil #: 6 x10 3/mm (ref 1.4–6.5)
Neutrophil %: 68.2 %
Platelet: 558 x10 3/mm — ABNORMAL HIGH (ref 150–440)
RBC: 5.16 10*6/uL (ref 3.80–5.20)
RDW: 16.6 % — ABNORMAL HIGH (ref 11.5–14.5)
WBC: 8.8 x10 3/mm (ref 3.6–11.0)

## 2013-04-28 ENCOUNTER — Ambulatory Visit: Payer: Self-pay | Admitting: Internal Medicine

## 2013-05-11 DIAGNOSIS — C4401 Basal cell carcinoma of skin of lip: Secondary | ICD-10-CM | POA: Insufficient documentation

## 2013-05-22 ENCOUNTER — Ambulatory Visit: Payer: Self-pay | Admitting: Internal Medicine

## 2013-07-06 ENCOUNTER — Ambulatory Visit: Payer: Self-pay | Admitting: Internal Medicine

## 2013-07-06 LAB — CBC CANCER CENTER
Basophil #: 0 x10 3/mm (ref 0.0–0.1)
Basophil %: 0.4 %
Eosinophil #: 0.3 x10 3/mm (ref 0.0–0.7)
Eosinophil %: 3.1 %
HCT: 41.6 % (ref 35.0–47.0)
HGB: 13.4 g/dL (ref 12.0–16.0)
Lymphocyte #: 1.2 x10 3/mm (ref 1.0–3.6)
Lymphocyte %: 11.9 %
MCH: 25 pg — ABNORMAL LOW (ref 26.0–34.0)
MCHC: 32.1 g/dL (ref 32.0–36.0)
MCV: 78 fL — ABNORMAL LOW (ref 80–100)
Monocyte #: 0.7 x10 3/mm (ref 0.2–0.9)
Monocyte %: 6.9 %
Neutrophil #: 7.9 x10 3/mm — ABNORMAL HIGH (ref 1.4–6.5)
Neutrophil %: 77.7 %
Platelet: 600 x10 3/mm — ABNORMAL HIGH (ref 150–440)
RBC: 5.35 10*6/uL — ABNORMAL HIGH (ref 3.80–5.20)
RDW: 16.5 % — ABNORMAL HIGH (ref 11.5–14.5)
WBC: 10.2 x10 3/mm (ref 3.6–11.0)

## 2013-07-18 LAB — OCCULT BLOOD X 1 CARD TO LAB, STOOL
Occult Blood, Feces: POSITIVE
Occult Blood, Feces: NEGATIVE
Occult Blood, Feces: NEGATIVE

## 2013-07-23 ENCOUNTER — Ambulatory Visit: Payer: Self-pay | Admitting: Internal Medicine

## 2013-07-26 LAB — CBC CANCER CENTER
Basophil #: 0.2 x10 3/mm — ABNORMAL HIGH (ref 0.0–0.1)
Basophil %: 3 %
Eosinophil #: 0.3 x10 3/mm (ref 0.0–0.7)
Eosinophil %: 3.6 %
HCT: 42.7 % (ref 35.0–47.0)
HGB: 13.5 g/dL (ref 12.0–16.0)
Lymphocyte #: 1 x10 3/mm (ref 1.0–3.6)
Lymphocyte %: 12.3 %
MCH: 25 pg — ABNORMAL LOW (ref 26.0–34.0)
MCHC: 31.5 g/dL — ABNORMAL LOW (ref 32.0–36.0)
MCV: 79 fL — ABNORMAL LOW (ref 80–100)
Monocyte #: 0.5 x10 3/mm (ref 0.2–0.9)
Monocyte %: 6.3 %
Neutrophil #: 6.2 x10 3/mm (ref 1.4–6.5)
Neutrophil %: 74.8 %
Platelet: 411 x10 3/mm (ref 150–440)
RBC: 5.38 10*6/uL — ABNORMAL HIGH (ref 3.80–5.20)
RDW: 17.9 % — ABNORMAL HIGH (ref 11.5–14.5)
WBC: 8.3 x10 3/mm (ref 3.6–11.0)

## 2013-08-09 LAB — CBC CANCER CENTER
Basophil #: 0.1 10*3/uL
Basophil %: 0.9 %
Eosinophil #: 0.2 10*3/uL
Eosinophil %: 2.6 %
HCT: 41.6 %
HGB: 13.5 g/dL
Lymphocyte %: 13.3 %
Lymphs Abs: 1.2 10*3/uL
MCH: 25.8 pg — ABNORMAL LOW
MCHC: 32.5 g/dL
MCV: 79 fL — ABNORMAL LOW
Monocyte #: 0.6 10*3/uL
Monocyte %: 6.5 %
Neutrophil #: 7.2 10*3/uL — ABNORMAL HIGH
Neutrophil %: 76.7 %
Platelet: 463 10*3/uL — ABNORMAL HIGH
RBC: 5.24 10*6/uL — ABNORMAL HIGH
RDW: 18.3 % — ABNORMAL HIGH
WBC: 9.4 10*3/uL

## 2013-08-20 ENCOUNTER — Ambulatory Visit: Payer: Self-pay | Admitting: Internal Medicine

## 2013-09-11 ENCOUNTER — Ambulatory Visit: Payer: Self-pay | Admitting: Gastroenterology

## 2013-09-13 LAB — CBC CANCER CENTER
Basophil #: 0.2 x10 3/mm — ABNORMAL HIGH (ref 0.0–0.1)
Basophil %: 1.4 %
Eosinophil #: 0.2 x10 3/mm (ref 0.0–0.7)
Eosinophil %: 2 %
HCT: 42.5 % (ref 35.0–47.0)
HGB: 13.3 g/dL (ref 12.0–16.0)
Lymphocyte #: 0.9 x10 3/mm — ABNORMAL LOW (ref 1.0–3.6)
Lymphocyte %: 8 %
MCH: 24.8 pg — ABNORMAL LOW (ref 26.0–34.0)
MCHC: 31.3 g/dL — ABNORMAL LOW (ref 32.0–36.0)
MCV: 79 fL — ABNORMAL LOW (ref 80–100)
Monocyte #: 0.8 x10 3/mm (ref 0.2–0.9)
Monocyte %: 6.8 %
Neutrophil #: 9.2 x10 3/mm — ABNORMAL HIGH (ref 1.4–6.5)
Neutrophil %: 81.8 %
Platelet: 457 x10 3/mm — ABNORMAL HIGH (ref 150–440)
RBC: 5.37 10*6/uL — ABNORMAL HIGH (ref 3.80–5.20)
RDW: 18.2 % — ABNORMAL HIGH (ref 11.5–14.5)
WBC: 11.2 x10 3/mm — ABNORMAL HIGH (ref 3.6–11.0)

## 2013-09-13 LAB — PATHOLOGY REPORT

## 2013-09-20 ENCOUNTER — Ambulatory Visit: Payer: Self-pay | Admitting: Internal Medicine

## 2013-09-27 LAB — CBC CANCER CENTER
Basophil #: 0.3 x10 3/mm — ABNORMAL HIGH (ref 0.0–0.1)
Basophil %: 3.4 %
Eosinophil #: 0.3 x10 3/mm (ref 0.0–0.7)
Eosinophil %: 3 %
HCT: 41.4 % (ref 35.0–47.0)
HGB: 12.8 g/dL (ref 12.0–16.0)
Lymphocyte #: 1.6 x10 3/mm (ref 1.0–3.6)
Lymphocyte %: 16.3 %
MCH: 24.7 pg — ABNORMAL LOW (ref 26.0–34.0)
MCHC: 31 g/dL — ABNORMAL LOW (ref 32.0–36.0)
MCV: 80 fL (ref 80–100)
Monocyte #: 0.7 x10 3/mm (ref 0.2–0.9)
Monocyte %: 7.3 %
Neutrophil #: 7 x10 3/mm — ABNORMAL HIGH (ref 1.4–6.5)
Neutrophil %: 70 %
Platelet: 527 x10 3/mm — ABNORMAL HIGH (ref 150–440)
RBC: 5.2 10*6/uL (ref 3.80–5.20)
RDW: 17.5 % — ABNORMAL HIGH (ref 11.5–14.5)
WBC: 10.1 x10 3/mm (ref 3.6–11.0)

## 2013-10-18 LAB — CBC CANCER CENTER
Basophil #: 0.2 x10 3/mm — ABNORMAL HIGH (ref 0.0–0.1)
Basophil %: 2.5 %
Eosinophil #: 0.3 x10 3/mm (ref 0.0–0.7)
Eosinophil %: 3.1 %
HCT: 41.1 % (ref 35.0–47.0)
HGB: 13.4 g/dL (ref 12.0–16.0)
Lymphocyte #: 1.4 x10 3/mm (ref 1.0–3.6)
Lymphocyte %: 14.5 %
MCH: 25.4 pg — ABNORMAL LOW (ref 26.0–34.0)
MCHC: 32.5 g/dL (ref 32.0–36.0)
MCV: 78 fL — ABNORMAL LOW (ref 80–100)
Monocyte #: 0.6 x10 3/mm (ref 0.2–0.9)
Monocyte %: 6 %
Neutrophil #: 7.3 x10 3/mm — ABNORMAL HIGH (ref 1.4–6.5)
Neutrophil %: 73.9 %
Platelet: 463 x10 3/mm — ABNORMAL HIGH (ref 150–440)
RBC: 5.27 10*6/uL — ABNORMAL HIGH (ref 3.80–5.20)
RDW: 17 % — ABNORMAL HIGH (ref 11.5–14.5)
WBC: 9.9 x10 3/mm (ref 3.6–11.0)

## 2013-10-20 ENCOUNTER — Ambulatory Visit: Payer: Self-pay | Admitting: Internal Medicine

## 2013-11-08 LAB — CBC CANCER CENTER
Basophil #: 0.2 x10 3/mm — ABNORMAL HIGH (ref 0.0–0.1)
Basophil %: 2.2 %
Eosinophil #: 0.3 x10 3/mm (ref 0.0–0.7)
Eosinophil %: 3.3 %
HCT: 38 % (ref 35.0–47.0)
HGB: 12.5 g/dL (ref 12.0–16.0)
Lymphocyte #: 1.5 x10 3/mm (ref 1.0–3.6)
Lymphocyte %: 15 %
MCH: 25.3 pg — ABNORMAL LOW (ref 26.0–34.0)
MCHC: 32.8 g/dL (ref 32.0–36.0)
MCV: 77 fL — ABNORMAL LOW (ref 80–100)
Monocyte #: 0.6 x10 3/mm (ref 0.2–0.9)
Monocyte %: 6.5 %
Neutrophil #: 7.1 x10 3/mm — ABNORMAL HIGH (ref 1.4–6.5)
Neutrophil %: 73 %
Platelet: 493 x10 3/mm — ABNORMAL HIGH (ref 150–440)
RBC: 4.93 10*6/uL (ref 3.80–5.20)
RDW: 16.6 % — ABNORMAL HIGH (ref 11.5–14.5)
WBC: 9.7 x10 3/mm (ref 3.6–11.0)

## 2013-11-20 ENCOUNTER — Ambulatory Visit: Payer: Self-pay | Admitting: Internal Medicine

## 2013-11-29 LAB — CBC CANCER CENTER
Basophil #: 0.3 x10 3/mm — ABNORMAL HIGH (ref 0.0–0.1)
Basophil %: 3.1 %
Eosinophil #: 0.3 x10 3/mm (ref 0.0–0.7)
Eosinophil %: 3.3 %
HCT: 43 % (ref 35.0–47.0)
HGB: 13.5 g/dL (ref 12.0–16.0)
Lymphocyte #: 1.5 x10 3/mm (ref 1.0–3.6)
Lymphocyte %: 14.4 %
MCH: 24.8 pg — ABNORMAL LOW (ref 26.0–34.0)
MCHC: 31.5 g/dL — ABNORMAL LOW (ref 32.0–36.0)
MCV: 79 fL — ABNORMAL LOW (ref 80–100)
Monocyte #: 0.7 x10 3/mm (ref 0.2–0.9)
Monocyte %: 7 %
Neutrophil #: 7.5 x10 3/mm — ABNORMAL HIGH (ref 1.4–6.5)
Neutrophil %: 72.2 %
Platelet: 562 x10 3/mm — ABNORMAL HIGH (ref 150–440)
RBC: 5.46 10*6/uL — ABNORMAL HIGH (ref 3.80–5.20)
RDW: 17.2 % — ABNORMAL HIGH (ref 11.5–14.5)
WBC: 10.3 x10 3/mm (ref 3.6–11.0)

## 2013-12-09 DIAGNOSIS — M7060 Trochanteric bursitis, unspecified hip: Secondary | ICD-10-CM | POA: Insufficient documentation

## 2013-12-20 ENCOUNTER — Ambulatory Visit: Payer: Self-pay | Admitting: Internal Medicine

## 2013-12-20 LAB — CBC CANCER CENTER
Basophil #: 0.2 x10 3/mm — ABNORMAL HIGH (ref 0.0–0.1)
Basophil %: 1.8 %
Eosinophil #: 0.3 x10 3/mm (ref 0.0–0.7)
Eosinophil %: 3 %
HCT: 39.8 % (ref 35.0–47.0)
HGB: 12.6 g/dL (ref 12.0–16.0)
Lymphocyte #: 1.2 x10 3/mm (ref 1.0–3.6)
Lymphocyte %: 11.5 %
MCH: 25 pg — ABNORMAL LOW (ref 26.0–34.0)
MCHC: 31.7 g/dL — ABNORMAL LOW (ref 32.0–36.0)
MCV: 79 fL — ABNORMAL LOW (ref 80–100)
Monocyte #: 0.8 x10 3/mm (ref 0.2–0.9)
Monocyte %: 7.3 %
Neutrophil #: 8 x10 3/mm — ABNORMAL HIGH (ref 1.4–6.5)
Neutrophil %: 76.4 %
Platelet: 562 x10 3/mm — ABNORMAL HIGH (ref 150–440)
RBC: 5.05 10*6/uL (ref 3.80–5.20)
RDW: 17.3 % — ABNORMAL HIGH (ref 11.5–14.5)
WBC: 10.5 x10 3/mm (ref 3.6–11.0)

## 2013-12-27 DIAGNOSIS — E119 Type 2 diabetes mellitus without complications: Secondary | ICD-10-CM | POA: Insufficient documentation

## 2014-01-17 ENCOUNTER — Other Ambulatory Visit (HOSPITAL_COMMUNITY): Payer: Self-pay | Admitting: Internal Medicine

## 2014-01-17 LAB — CBC CANCER CENTER
Basophil #: 0.2 x10 3/mm — ABNORMAL HIGH (ref 0.0–0.1)
Basophil %: 1.9 %
Eosinophil #: 0.4 x10 3/mm (ref 0.0–0.7)
Eosinophil %: 3.8 %
HCT: 43.3 % (ref 35.0–47.0)
HGB: 13.8 g/dL (ref 12.0–16.0)
Lymphocyte #: 1.3 x10 3/mm (ref 1.0–3.6)
Lymphocyte %: 12.6 %
MCH: 25.4 pg — ABNORMAL LOW (ref 26.0–34.0)
MCHC: 31.9 g/dL — ABNORMAL LOW (ref 32.0–36.0)
MCV: 80 fL (ref 80–100)
Monocyte #: 0.3 x10 3/mm (ref 0.2–0.9)
Monocyte %: 2.8 %
Neutrophil #: 8.3 x10 3/mm — ABNORMAL HIGH (ref 1.4–6.5)
Neutrophil %: 78.9 %
Platelet: 449 x10 3/mm — ABNORMAL HIGH (ref 150–440)
RBC: 5.44 10*6/uL — ABNORMAL HIGH (ref 3.80–5.20)
RDW: 17.9 % — ABNORMAL HIGH (ref 11.5–14.5)
WBC: 10.5 x10 3/mm (ref 3.6–11.0)

## 2014-01-17 LAB — BASIC METABOLIC PANEL
Anion Gap: 11 (ref 7–16)
BUN: 25 mg/dL — ABNORMAL HIGH (ref 7–18)
Calcium, Total: 9.5 mg/dL (ref 8.5–10.1)
Chloride: 99 mmol/L (ref 98–107)
Co2: 27 mmol/L (ref 21–32)
Creatinine: 1.26 mg/dL (ref 0.60–1.30)
EGFR (African American): 47 — ABNORMAL LOW
EGFR (Non-African Amer.): 40 — ABNORMAL LOW
Glucose: 164 mg/dL — ABNORMAL HIGH (ref 65–99)
Osmolality: 282 (ref 275–301)
Potassium: 4.3 mmol/L (ref 3.5–5.1)
Sodium: 137 mmol/L (ref 136–145)

## 2014-01-17 LAB — MAGNESIUM: Magnesium: 1.5 mg/dL — ABNORMAL LOW

## 2014-01-20 ENCOUNTER — Ambulatory Visit: Payer: Self-pay | Admitting: Internal Medicine

## 2014-02-14 LAB — CBC CANCER CENTER
Basophil #: 0.1 x10 3/mm (ref 0.0–0.1)
Basophil %: 1.1 %
Eosinophil #: 0.3 x10 3/mm (ref 0.0–0.7)
Eosinophil %: 2.5 %
HCT: 41.7 % (ref 35.0–47.0)
HGB: 13 g/dL (ref 12.0–16.0)
Lymphocyte #: 1.2 x10 3/mm (ref 1.0–3.6)
Lymphocyte %: 11.1 %
MCH: 24.9 pg — ABNORMAL LOW (ref 26.0–34.0)
MCHC: 31.2 g/dL — ABNORMAL LOW (ref 32.0–36.0)
MCV: 80 fL (ref 80–100)
Monocyte #: 0.6 x10 3/mm (ref 0.2–0.9)
Monocyte %: 6.2 %
Neutrophil #: 8.3 x10 3/mm — ABNORMAL HIGH (ref 1.4–6.5)
Neutrophil %: 79.1 %
Platelet: 407 x10 3/mm (ref 150–440)
RBC: 5.21 10*6/uL — ABNORMAL HIGH (ref 3.80–5.20)
RDW: 17.3 % — ABNORMAL HIGH (ref 11.5–14.5)
WBC: 10.4 x10 3/mm (ref 3.6–11.0)

## 2014-02-14 LAB — MAGNESIUM: Magnesium: 1.5 mg/dL — ABNORMAL LOW

## 2014-02-14 LAB — SODIUM: Sodium: 138 mmol/L (ref 136–145)

## 2014-02-20 ENCOUNTER — Ambulatory Visit: Payer: Self-pay | Admitting: Internal Medicine

## 2014-03-07 LAB — CBC CANCER CENTER
Basophil #: 0 x10 3/mm (ref 0.0–0.1)
Basophil %: 0.2 %
Eosinophil #: 0.4 x10 3/mm (ref 0.0–0.7)
Eosinophil %: 3.1 %
HCT: 42 % (ref 35.0–47.0)
HGB: 13.1 g/dL (ref 12.0–16.0)
Lymphocyte #: 1.4 x10 3/mm (ref 1.0–3.6)
Lymphocyte %: 11.9 %
MCH: 24.8 pg — ABNORMAL LOW (ref 26.0–34.0)
MCHC: 31.2 g/dL — ABNORMAL LOW (ref 32.0–36.0)
MCV: 80 fL (ref 80–100)
Monocyte #: 0.6 x10 3/mm (ref 0.2–0.9)
Monocyte %: 5.3 %
Neutrophil #: 9.4 x10 3/mm — ABNORMAL HIGH (ref 1.4–6.5)
Neutrophil %: 79.5 %
Platelet: 529 x10 3/mm — ABNORMAL HIGH (ref 150–440)
RBC: 5.29 10*6/uL — ABNORMAL HIGH (ref 3.80–5.20)
RDW: 17.3 % — ABNORMAL HIGH (ref 11.5–14.5)
WBC: 11.9 x10 3/mm — ABNORMAL HIGH (ref 3.6–11.0)

## 2014-03-21 ENCOUNTER — Other Ambulatory Visit (HOSPITAL_COMMUNITY): Payer: Self-pay | Admitting: Internal Medicine

## 2014-03-22 ENCOUNTER — Ambulatory Visit: Payer: Self-pay | Admitting: Internal Medicine

## 2014-03-28 LAB — CBC CANCER CENTER
Basophil #: 0 x10 3/mm (ref 0.0–0.1)
Basophil %: 0.3 %
Eosinophil #: 0.3 x10 3/mm (ref 0.0–0.7)
Eosinophil %: 3.3 %
HCT: 40.7 % (ref 35.0–47.0)
HGB: 12.7 g/dL (ref 12.0–16.0)
Lymphocyte #: 1.6 x10 3/mm (ref 1.0–3.6)
Lymphocyte %: 14.7 %
MCH: 24.8 pg — ABNORMAL LOW (ref 26.0–34.0)
MCHC: 31.2 g/dL — ABNORMAL LOW (ref 32.0–36.0)
MCV: 80 fL (ref 80–100)
Monocyte #: 0.6 x10 3/mm (ref 0.2–0.9)
Monocyte %: 5.7 %
Neutrophil #: 8.1 x10 3/mm — ABNORMAL HIGH (ref 1.4–6.5)
Neutrophil %: 76 %
Platelet: 421 x10 3/mm (ref 150–440)
RBC: 5.12 10*6/uL (ref 3.80–5.20)
RDW: 17 % — ABNORMAL HIGH (ref 11.5–14.5)
WBC: 10.6 x10 3/mm (ref 3.6–11.0)

## 2014-04-18 LAB — CBC CANCER CENTER
Basophil #: 0.2 x10 3/mm — ABNORMAL HIGH (ref 0.0–0.1)
Basophil %: 2.4 %
Eosinophil #: 0.3 x10 3/mm (ref 0.0–0.7)
Eosinophil %: 2.8 %
HCT: 38.9 % (ref 35.0–47.0)
HGB: 12.1 g/dL (ref 12.0–16.0)
Lymphocyte #: 1.4 x10 3/mm (ref 1.0–3.6)
Lymphocyte %: 13.9 %
MCH: 24.9 pg — ABNORMAL LOW (ref 26.0–34.0)
MCHC: 31.1 g/dL — ABNORMAL LOW (ref 32.0–36.0)
MCV: 80 fL (ref 80–100)
Monocyte #: 0.5 x10 3/mm (ref 0.2–0.9)
Monocyte %: 4.8 %
Neutrophil #: 7.7 x10 3/mm — ABNORMAL HIGH (ref 1.4–6.5)
Neutrophil %: 76.1 %
Platelet: 401 x10 3/mm (ref 150–440)
RBC: 4.85 10*6/uL (ref 3.80–5.20)
RDW: 17 % — ABNORMAL HIGH (ref 11.5–14.5)
WBC: 10.2 x10 3/mm (ref 3.6–11.0)

## 2014-04-22 ENCOUNTER — Ambulatory Visit: Payer: Self-pay | Admitting: Internal Medicine

## 2014-05-09 LAB — CBC CANCER CENTER
Basophil #: 0.3 x10 3/mm — ABNORMAL HIGH (ref 0.0–0.1)
Basophil %: 2.8 %
Eosinophil #: 0.4 x10 3/mm (ref 0.0–0.7)
Eosinophil %: 3.7 %
HCT: 40.4 % (ref 35.0–47.0)
HGB: 12.5 g/dL (ref 12.0–16.0)
Lymphocyte #: 1.5 x10 3/mm (ref 1.0–3.6)
Lymphocyte %: 14.4 %
MCH: 24.6 pg — ABNORMAL LOW (ref 26.0–34.0)
MCHC: 31 g/dL — ABNORMAL LOW (ref 32.0–36.0)
MCV: 80 fL (ref 80–100)
Monocyte #: 0.7 x10 3/mm (ref 0.2–0.9)
Monocyte %: 6.5 %
Neutrophil #: 7.7 x10 3/mm — ABNORMAL HIGH (ref 1.4–6.5)
Neutrophil %: 72.6 %
Platelet: 427 x10 3/mm (ref 150–440)
RBC: 5.08 10*6/uL (ref 3.80–5.20)
RDW: 16.6 % — ABNORMAL HIGH (ref 11.5–14.5)
WBC: 10.6 x10 3/mm (ref 3.6–11.0)

## 2014-05-22 ENCOUNTER — Ambulatory Visit: Payer: Self-pay | Admitting: Internal Medicine

## 2014-06-13 DIAGNOSIS — E790 Hyperuricemia without signs of inflammatory arthritis and tophaceous disease: Secondary | ICD-10-CM | POA: Insufficient documentation

## 2014-06-13 DIAGNOSIS — D751 Secondary polycythemia: Secondary | ICD-10-CM | POA: Insufficient documentation

## 2014-06-25 ENCOUNTER — Ambulatory Visit: Payer: Self-pay | Admitting: Internal Medicine

## 2014-06-25 LAB — CBC CANCER CENTER
Basophil #: 0.2 x10 3/mm — ABNORMAL HIGH (ref 0.0–0.1)
Basophil %: 2 %
Eosinophil #: 0.4 x10 3/mm (ref 0.0–0.7)
Eosinophil %: 3.9 %
HCT: 38.7 % (ref 35.0–47.0)
HGB: 12.1 g/dL (ref 12.0–16.0)
Lymphocyte #: 1.5 x10 3/mm (ref 1.0–3.6)
Lymphocyte %: 14.5 %
MCH: 23.9 pg — ABNORMAL LOW (ref 26.0–34.0)
MCHC: 31.2 g/dL — ABNORMAL LOW (ref 32.0–36.0)
MCV: 77 fL — ABNORMAL LOW (ref 80–100)
Monocyte #: 0.8 x10 3/mm (ref 0.2–0.9)
Monocyte %: 7.3 %
Neutrophil #: 7.4 x10 3/mm — ABNORMAL HIGH (ref 1.4–6.5)
Neutrophil %: 72.3 %
Platelet: 505 x10 3/mm — ABNORMAL HIGH (ref 150–440)
RBC: 5.06 10*6/uL (ref 3.80–5.20)
RDW: 17 % — ABNORMAL HIGH (ref 11.5–14.5)
WBC: 10.3 x10 3/mm (ref 3.6–11.0)

## 2014-07-03 ENCOUNTER — Ambulatory Visit: Payer: Self-pay | Admitting: Internal Medicine

## 2014-07-23 ENCOUNTER — Ambulatory Visit: Payer: Self-pay | Admitting: Internal Medicine

## 2014-07-23 LAB — CBC CANCER CENTER
Basophil #: 0.2 x10 3/mm — ABNORMAL HIGH (ref 0.0–0.1)
Basophil %: 2 %
Eosinophil #: 0.4 x10 3/mm (ref 0.0–0.7)
Eosinophil %: 3.6 %
HCT: 38.4 % (ref 35.0–47.0)
HGB: 12.1 g/dL (ref 12.0–16.0)
Lymphocyte #: 1.5 x10 3/mm (ref 1.0–3.6)
Lymphocyte %: 13.9 %
MCH: 23.9 pg — ABNORMAL LOW (ref 26.0–34.0)
MCHC: 31.5 g/dL — ABNORMAL LOW (ref 32.0–36.0)
MCV: 76 fL — ABNORMAL LOW (ref 80–100)
Monocyte #: 0.7 x10 3/mm (ref 0.2–0.9)
Monocyte %: 6.5 %
Neutrophil #: 7.8 x10 3/mm — ABNORMAL HIGH (ref 1.4–6.5)
Neutrophil %: 74 %
Platelet: 468 x10 3/mm — ABNORMAL HIGH (ref 150–440)
RBC: 5.06 10*6/uL (ref 3.80–5.20)
RDW: 17.1 % — ABNORMAL HIGH (ref 11.5–14.5)
WBC: 10.6 x10 3/mm (ref 3.6–11.0)

## 2014-08-21 ENCOUNTER — Ambulatory Visit
Admit: 2014-08-21 | Disposition: A | Discharge status: Home / Self Care | Payer: Self-pay | Attending: Internal Medicine | Admitting: Internal Medicine

## 2014-09-17 LAB — CBC CANCER CENTER
Basophil #: 0.4 x10 3/mm — ABNORMAL HIGH (ref 0.0–0.1)
Basophil %: 3.5 %
Eosinophil #: 0.4 x10 3/mm (ref 0.0–0.7)
Eosinophil %: 3.6 %
HCT: 38.2 % (ref 35.0–47.0)
HGB: 12.1 g/dL (ref 12.0–16.0)
Lymphocyte #: 1.4 x10 3/mm (ref 1.0–3.6)
Lymphocyte %: 13.3 %
MCH: 23.9 pg — ABNORMAL LOW (ref 26.0–34.0)
MCHC: 31.5 g/dL — ABNORMAL LOW (ref 32.0–36.0)
MCV: 76 fL — ABNORMAL LOW (ref 80–100)
Monocyte #: 0.6 x10 3/mm (ref 0.2–0.9)
Monocyte %: 6 %
Neutrophil #: 7.5 x10 3/mm — ABNORMAL HIGH (ref 1.4–6.5)
Neutrophil %: 73.6 %
Platelet: 475 x10 3/mm — ABNORMAL HIGH (ref 150–440)
RBC: 5.04 10*6/uL (ref 3.80–5.20)
RDW: 18 % — ABNORMAL HIGH (ref 11.5–14.5)
WBC: 10.2 x10 3/mm (ref 3.6–11.0)

## 2014-09-21 ENCOUNTER — Ambulatory Visit
Admit: 2014-09-21 | Disposition: A | Discharge status: Home / Self Care | Payer: Self-pay | Attending: Internal Medicine | Admitting: Internal Medicine

## 2014-09-24 DIAGNOSIS — R809 Proteinuria, unspecified: Secondary | ICD-10-CM | POA: Insufficient documentation

## 2014-10-15 LAB — CBC CANCER CENTER
Basophil #: 0.3 x10 3/mm — ABNORMAL HIGH (ref 0.0–0.1)
Basophil %: 2.5 %
Eosinophil #: 0.4 x10 3/mm (ref 0.0–0.7)
Eosinophil %: 3.7 %
HCT: 39.4 % (ref 35.0–47.0)
HGB: 12.2 g/dL (ref 12.0–16.0)
Lymphocyte #: 1.5 x10 3/mm (ref 1.0–3.6)
Lymphocyte %: 13.9 %
MCH: 23.5 pg — ABNORMAL LOW (ref 26.0–34.0)
MCHC: 30.9 g/dL — ABNORMAL LOW (ref 32.0–36.0)
MCV: 76 fL — ABNORMAL LOW (ref 80–100)
Monocyte #: 0.7 x10 3/mm (ref 0.2–0.9)
Monocyte %: 6.4 %
Neutrophil #: 7.8 x10 3/mm — ABNORMAL HIGH (ref 1.4–6.5)
Neutrophil %: 73.5 %
Platelet: 476 x10 3/mm — ABNORMAL HIGH (ref 150–440)
RBC: 5.18 10*6/uL (ref 3.80–5.20)
RDW: 17.7 % — ABNORMAL HIGH (ref 11.5–14.5)
WBC: 10.6 x10 3/mm (ref 3.6–11.0)

## 2014-11-26 ENCOUNTER — Inpatient Hospital Stay: Payer: Medicare Other | Attending: Family Medicine

## 2014-11-26 DIAGNOSIS — D45 Polycythemia vera: Secondary | ICD-10-CM | POA: Insufficient documentation

## 2014-11-26 DIAGNOSIS — D75839 Thrombocytosis, unspecified: Secondary | ICD-10-CM

## 2014-11-26 DIAGNOSIS — D473 Essential (hemorrhagic) thrombocythemia: Secondary | ICD-10-CM

## 2014-11-26 LAB — CBC WITH DIFFERENTIAL/PLATELET
Basophils Absolute: 0.3 10*3/uL — ABNORMAL HIGH (ref 0–0.1)
Basophils Relative: 3 %
Eosinophils Absolute: 0.4 10*3/uL (ref 0–0.7)
Eosinophils Relative: 3 %
HCT: 41.7 % (ref 35.0–47.0)
Hemoglobin: 12.8 g/dL (ref 12.0–16.0)
Lymphocytes Relative: 14 %
Lymphs Abs: 1.5 10*3/uL (ref 1.0–3.6)
MCH: 23.3 pg — ABNORMAL LOW (ref 26.0–34.0)
MCHC: 30.7 g/dL — ABNORMAL LOW (ref 32.0–36.0)
MCV: 75.8 fL — ABNORMAL LOW (ref 80.0–100.0)
Monocytes Absolute: 0.6 10*3/uL (ref 0.2–0.9)
Monocytes Relative: 5 %
Neutro Abs: 8.3 10*3/uL — ABNORMAL HIGH (ref 1.4–6.5)
Neutrophils Relative %: 75 %
Platelets: 428 10*3/uL (ref 150–440)
RBC: 5.5 MIL/uL — ABNORMAL HIGH (ref 3.80–5.20)
RDW: 18 % — ABNORMAL HIGH (ref 11.5–14.5)
WBC: 11.1 10*3/uL — ABNORMAL HIGH (ref 3.6–11.0)

## 2014-12-20 ENCOUNTER — Emergency Department: Payer: Medicare Other

## 2014-12-20 ENCOUNTER — Observation Stay: Payer: Medicare Other

## 2014-12-20 ENCOUNTER — Inpatient Hospital Stay
Admission: EM | Admit: 2014-12-20 | Discharge: 2014-12-23 | DRG: 066 | Disposition: A | Discharge status: Skilled Nursing Facility | Payer: Medicare Other | Attending: Internal Medicine | Admitting: Internal Medicine

## 2014-12-20 ENCOUNTER — Observation Stay
Admit: 2014-12-20 | Discharge: 2014-12-20 | Disposition: A | Discharge status: Home / Self Care | Payer: Medicare Other | Attending: Internal Medicine | Admitting: Internal Medicine

## 2014-12-20 ENCOUNTER — Encounter: Payer: Self-pay | Admitting: Emergency Medicine

## 2014-12-20 DIAGNOSIS — Z8541 Personal history of malignant neoplasm of cervix uteri: Secondary | ICD-10-CM

## 2014-12-20 DIAGNOSIS — E119 Type 2 diabetes mellitus without complications: Secondary | ICD-10-CM | POA: Diagnosis present

## 2014-12-20 DIAGNOSIS — R4781 Slurred speech: Secondary | ICD-10-CM | POA: Diagnosis present

## 2014-12-20 DIAGNOSIS — Z87891 Personal history of nicotine dependence: Secondary | ICD-10-CM

## 2014-12-20 DIAGNOSIS — Z7982 Long term (current) use of aspirin: Secondary | ICD-10-CM

## 2014-12-20 DIAGNOSIS — Z8249 Family history of ischemic heart disease and other diseases of the circulatory system: Secondary | ICD-10-CM

## 2014-12-20 DIAGNOSIS — I159 Secondary hypertension, unspecified: Secondary | ICD-10-CM

## 2014-12-20 DIAGNOSIS — I639 Cerebral infarction, unspecified: Principal | ICD-10-CM | POA: Diagnosis present

## 2014-12-20 DIAGNOSIS — E785 Hyperlipidemia, unspecified: Secondary | ICD-10-CM | POA: Diagnosis present

## 2014-12-20 DIAGNOSIS — G459 Transient cerebral ischemic attack, unspecified: Secondary | ICD-10-CM | POA: Diagnosis present

## 2014-12-20 HISTORY — DX: Type 2 diabetes mellitus without complications: E11.9

## 2014-12-20 HISTORY — DX: Carcinoma in situ of cervix, unspecified: D06.9

## 2014-12-20 HISTORY — DX: Essential (primary) hypertension: I10

## 2014-12-20 HISTORY — DX: Trochanteric bursitis, unspecified hip: M70.60

## 2014-12-20 HISTORY — DX: Personal history of transient ischemic attack (TIA), and cerebral infarction without residual deficits: Z86.73

## 2014-12-20 HISTORY — DX: Hyperuricemia without signs of inflammatory arthritis and tophaceous disease: E79.0

## 2014-12-20 HISTORY — DX: Personal history of (healed) traumatic fracture: Z87.81

## 2014-12-20 HISTORY — DX: Malignant neoplasm of endometrium: C54.1

## 2014-12-20 HISTORY — DX: Proteinuria, unspecified: R80.9

## 2014-12-20 HISTORY — DX: Polycythemia vera: D45

## 2014-12-20 LAB — CBC WITH DIFFERENTIAL/PLATELET
Basophils Absolute: 0.3 10*3/uL — ABNORMAL HIGH (ref 0–0.1)
Basophils Relative: 2 %
Eosinophils Absolute: 0.4 10*3/uL (ref 0–0.7)
Eosinophils Relative: 4 %
HCT: 44.8 % (ref 35.0–47.0)
Hemoglobin: 13.7 g/dL (ref 12.0–16.0)
Lymphocytes Relative: 16 %
Lymphs Abs: 1.8 10*3/uL (ref 1.0–3.6)
MCH: 23.3 pg — ABNORMAL LOW (ref 26.0–34.0)
MCHC: 30.6 g/dL — ABNORMAL LOW (ref 32.0–36.0)
MCV: 76 fL — ABNORMAL LOW (ref 80.0–100.0)
Monocytes Absolute: 0.7 10*3/uL (ref 0.2–0.9)
Monocytes Relative: 7 %
Neutro Abs: 8 10*3/uL — ABNORMAL HIGH (ref 1.4–6.5)
Neutrophils Relative %: 71 %
Platelets: 508 10*3/uL — ABNORMAL HIGH (ref 150–440)
RBC: 5.89 MIL/uL — ABNORMAL HIGH (ref 3.80–5.20)
RDW: 18 % — ABNORMAL HIGH (ref 11.5–14.5)
WBC: 11.3 10*3/uL — ABNORMAL HIGH (ref 3.6–11.0)

## 2014-12-20 LAB — URINALYSIS COMPLETE WITH MICROSCOPIC (ARMC ONLY)
Bilirubin Urine: NEGATIVE
Glucose, UA: 50 mg/dL — AB
Hgb urine dipstick: NEGATIVE
Ketones, ur: NEGATIVE mg/dL
Leukocytes, UA: NEGATIVE
Nitrite: NEGATIVE
Protein, ur: 100 mg/dL — AB
Specific Gravity, Urine: 1.009 (ref 1.005–1.030)
pH: 8 (ref 5.0–8.0)

## 2014-12-20 LAB — APTT: aPTT: 30 seconds (ref 24–36)

## 2014-12-20 LAB — COMPREHENSIVE METABOLIC PANEL
ALT: 15 U/L (ref 14–54)
AST: 19 U/L (ref 15–41)
Albumin: 4.6 g/dL (ref 3.5–5.0)
Alkaline Phosphatase: 53 U/L (ref 38–126)
Anion gap: 12 (ref 5–15)
BUN: 19 mg/dL (ref 6–20)
CO2: 27 mmol/L (ref 22–32)
Calcium: 9.9 mg/dL (ref 8.9–10.3)
Chloride: 101 mmol/L (ref 101–111)
Creatinine, Ser: 0.95 mg/dL (ref 0.44–1.00)
GFR calc Af Amer: >60 mL/min (ref >60)
GFR calc non Af Amer: 55 mL/min — ABNORMAL LOW (ref >60)
Glucose, Bld: 202 mg/dL — ABNORMAL HIGH (ref 65–99)
Potassium: 3.7 mmol/L (ref 3.5–5.1)
Sodium: 140 mmol/L (ref 135–145)
Total Bilirubin: 0.4 mg/dL (ref 0.3–1.2)
Total Protein: 7.6 g/dL (ref 6.5–8.1)

## 2014-12-20 LAB — URINE DRUG SCREEN, QUALITATIVE (ARMC ONLY)
Amphetamines, Ur Screen: NOT DETECTED
Barbiturates, Ur Screen: NOT DETECTED
Benzodiazepine, Ur Scrn: NOT DETECTED
Cannabinoid 50 Ng, Ur ~~LOC~~: NOT DETECTED
Cocaine Metabolite,Ur ~~LOC~~: NOT DETECTED
MDMA (Ecstasy)Ur Screen: NOT DETECTED
Methadone Scn, Ur: NOT DETECTED
Opiate, Ur Screen: NOT DETECTED
Phencyclidine (PCP) Ur S: NOT DETECTED
Tricyclic, Ur Screen: NOT DETECTED

## 2014-12-20 LAB — LIPID PANEL
Cholesterol: 148 mg/dL (ref 0–200)
HDL: 44 mg/dL (ref >40)
LDL Cholesterol: 78 mg/dL (ref 0–99)
Total CHOL/HDL Ratio: 3.4 RATIO
Triglycerides: 130 mg/dL (ref <150)
VLDL: 26 mg/dL (ref 0–40)

## 2014-12-20 LAB — PROTIME-INR
INR: 1.06
Prothrombin Time: 14 seconds (ref 11.4–15.0)

## 2014-12-20 LAB — MAGNESIUM: Magnesium: 1.8 mg/dL (ref 1.7–2.4)

## 2014-12-20 LAB — ETHANOL: Alcohol, Ethyl (B): <5 mg/dL (ref <5)

## 2014-12-20 LAB — TROPONIN I: Troponin I: <0.03 ng/mL (ref <0.031)

## 2014-12-20 LAB — LIPASE, BLOOD: Lipase: 52 U/L — ABNORMAL HIGH (ref 22–51)

## 2014-12-20 MED ORDER — CLOPIDOGREL BISULFATE 75 MG PO TABS
75.0000 mg | ORAL_TABLET | Freq: Every day | ORAL | Status: DC
  Administered 2014-12-21 – 2014-12-23 (×3): 75 mg via ORAL
  Filled 2014-12-20 (×3): qty 1

## 2014-12-20 MED ORDER — SODIUM CHLORIDE 0.9 % IV BOLUS (SEPSIS)
500.0000 mL | INTRAVENOUS | Status: AC
  Administered 2014-12-20: 500 mL via INTRAVENOUS

## 2014-12-20 MED ORDER — PRAVASTATIN SODIUM 20 MG PO TABS: 20.0000 mg | ORAL_TABLET | Freq: Every day | ORAL | Status: DC

## 2014-12-20 MED ORDER — RAMIPRIL 5 MG PO CAPS
10.0000 mg | ORAL_CAPSULE | Freq: Two times a day (BID) | ORAL | Status: DC
  Administered 2014-12-20 – 2014-12-23 (×6): 10 mg via ORAL
  Filled 2014-12-20 (×6): qty 2

## 2014-12-20 MED ORDER — ASPIRIN 81 MG PO CHEW
324.0000 mg | CHEWABLE_TABLET | Freq: Once | ORAL | Status: AC
  Administered 2014-12-20: 324 mg via ORAL

## 2014-12-20 MED ORDER — METFORMIN HCL 500 MG PO TABS
1000.0000 mg | ORAL_TABLET | Freq: Two times a day (BID) | ORAL | Status: DC
  Administered 2014-12-21 – 2014-12-23 (×5): 1000 mg via ORAL
  Filled 2014-12-20 (×5): qty 2

## 2014-12-20 MED ORDER — ONDANSETRON HCL 4 MG/2ML IJ SOLN
INTRAMUSCULAR | Status: AC
  Administered 2014-12-20: 4 mg via INTRAVENOUS
  Filled 2014-12-20: qty 2

## 2014-12-20 MED ORDER — ASPIRIN 81 MG PO CHEW
CHEWABLE_TABLET | ORAL | Status: AC
  Administered 2014-12-20: 324 mg via ORAL
  Filled 2014-12-20: qty 4

## 2014-12-20 MED ORDER — INSULIN ASPART 100 UNIT/ML ~~LOC~~ SOLN
0.0000 [IU] | Freq: Three times a day (TID) | SUBCUTANEOUS | Status: DC
  Administered 2014-12-21 (×2): 2 [IU] via SUBCUTANEOUS
  Administered 2014-12-22 (×2): 1 [IU] via SUBCUTANEOUS
  Administered 2014-12-22: 2 [IU] via SUBCUTANEOUS
  Administered 2014-12-23: 1 [IU] via SUBCUTANEOUS
  Administered 2014-12-23: 2 [IU] via SUBCUTANEOUS
  Filled 2014-12-20: qty 2
  Filled 2014-12-20: qty 1
  Filled 2014-12-20 (×3): qty 2
  Filled 2014-12-20 (×2): qty 1

## 2014-12-20 MED ORDER — ONDANSETRON HCL 4 MG/2ML IJ SOLN
4.0000 mg | INTRAMUSCULAR | Status: AC
  Administered 2014-12-20: 4 mg via INTRAVENOUS

## 2014-12-20 MED ORDER — CARVEDILOL 3.125 MG PO TABS
3.1250 mg | ORAL_TABLET | Freq: Two times a day (BID) | ORAL | Status: DC
  Administered 2014-12-20 – 2014-12-23 (×6): 3.125 mg via ORAL
  Filled 2014-12-20 (×7): qty 1

## 2014-12-20 MED ORDER — VERAPAMIL HCL ER 120 MG PO TBCR
240.0000 mg | EXTENDED_RELEASE_TABLET | Freq: Every day | ORAL | Status: DC
  Administered 2014-12-21 – 2014-12-23 (×3): 240 mg via ORAL
  Filled 2014-12-20 (×3): qty 2

## 2014-12-20 MED ORDER — ATORVASTATIN CALCIUM 20 MG PO TABS: 40.0000 mg | ORAL_TABLET | Freq: Every day | ORAL | Status: DC

## 2014-12-20 MED ORDER — HYDROXYUREA 500 MG PO CAPS
500.0000 mg | ORAL_CAPSULE | Freq: Every day | ORAL | Status: DC
  Administered 2014-12-21 – 2014-12-23 (×3): 500 mg via ORAL
  Filled 2014-12-20 (×4): qty 1

## 2014-12-20 MED ORDER — ASPIRIN EC 81 MG PO TBEC
81.0000 mg | DELAYED_RELEASE_TABLET | Freq: Every day | ORAL | Status: DC
  Administered 2014-12-21 – 2014-12-23 (×3): 81 mg via ORAL
  Filled 2014-12-20 (×2): qty 1

## 2014-12-20 NOTE — ED Provider Notes (Signed)
[facility] Emergency Department Provider Note  ____________________________________________  Time seen: Approximately 3:31 PM  I have reviewed the triage vital signs and the nursing notes.   HISTORY  Chief Complaint  Patient presents with  . Nausea  . Emesis  . Dizziness     HPI [name] is a 81 y.o. female with a history of hypertension, polycythemia vera and prior [name] presents with acute onset of feeling flushed, lightheaded, dizzy, and having multiple episodes of vomiting starting about 3 hours prior to arrival.  EMS was called and found her systolic blood pressure to be in the 200s.  She vomited throughout her trip to the emergency department.  Upon arrival, she denies headache but states that she feels weak all over and lightheaded with persistent nausea.  She also states that she feels flushed.  She has never had these symptoms before.  She states that her stomach has "been messed up" for several days but that today is the first day she has been vomiting.  She denies feeling any weakness in any of her extremities.  She is alert and oriented even though she is in moderate distress from her nausea and vomiting.   Past Medical History  Diagnosis Date  . Hypertension   . Hyperuricemia   . Diabetes mellitus without complication   . Polycythemia vera   . H/O TIA (transient ischemic attack) and stroke   . Adenocarcinoma in situ of cervix   . H/O compression fracture of spine 2014    thoracic spine  . Microalbuminuria   . Trochanteric bursitis   . Endometrial carcinoma     s/p total abdominal hysterectomy    There are no active problems to display for this patient.   Past Surgical History  Procedure Laterality Date  . Tonsillectomy    . Exploratory laparotomy      for fibroids  . Cataract extraction w/ intraocular lens implant Right   . Abdominal hysterectomy      Current Outpatient Rx  Name  Route  Sig  Dispense  Refill  .  alendronate (FOSAMAX) 70 MG tablet   Oral   Take 70 mg by mouth once a week. Take with a full glass of water on an empty stomach.         Marland Kitchen aspirin EC 81 MG tablet   Oral   Take 81 mg by mouth daily.         . calcium-vitamin D (OSCAL WITH D) 500-200 MG-UNIT per tablet   Oral   Take 1 tablet by mouth.         . carvedilol (COREG) 3.125 MG tablet   Oral   Take 3.125 mg by mouth 2 (two) times daily with a meal.         . fexofenadine (ALLEGRA) 180 MG tablet   Oral   Take 180 mg by mouth daily.         . hydroxyurea (HYDREA) 500 MG capsule   Oral   Take 500 mg by mouth daily. May take with food to minimize GI side effects.         . metFORMIN (GLUCOPHAGE) 1000 MG tablet   Oral   Take 1,000 mg by mouth 2 (two) times daily with a meal.         . potassium chloride (K-DUR,KLOR-CON) 10 MEQ tablet   Oral   Take 10 mEq by mouth daily.         . pravastatin (PRAVACHOL) 20 MG  tablet   Oral   Take 20 mg by mouth daily.         . ramipril (ALTACE) 10 MG capsule   Oral   Take 10 mg by mouth daily.         . verapamil (COVERA HS) 240 MG (CO) 24 hr tablet   Oral   Take 240 mg by mouth at bedtime.           Allergies Review of patient's allergies indicates no known allergies.  History reviewed. No pertinent family history.  Social History History  Substance Use Topics  . Smoking status: Former Research scientist (life sciences)  . Smokeless tobacco: Never Used  . Alcohol Use: No    Review of Systems Constitutional: No fever/chills Eyes: No visual changes. ENT: No sore throat. Cardiovascular: Denies chest pain. Respiratory: Denies shortness of breath. Gastrointestinal: Abdominal cramping earlier today.  Intractable vomiting for several hours.  No diarrhea.  Mild constipation. Genitourinary: Negative for dysuria. Musculoskeletal: Negative for back pain. Skin: Negative for rash. Neurological: Negative for headaches, focal weakness or numbness.  10-point ROS otherwise  negative.  ____________________________________________   PHYSICAL EXAM:  ED Triage Vitals  Enc Vitals Group     BP 12/20/14 1530 194/103 mmHg     Pulse Rate 12/20/14 1539 91     Resp 12/20/14 1530 21     Temp 12/20/14 1540 97.4 F (36.3 C)     Temp Source 12/20/14 1539 Oral     SpO2 12/20/14 1539 96 %     Weight --      Height --      Head Cir --      Peak Flow --      Pain Score --      Pain Loc --      Pain Edu? --      Excl. in [place]? --      Constitutional: Alert but ill-appearing.  Oriented 3.   Eyes: Conjunctivae are normal.  Left pupil appears sluggish in response to light.  She has a mild left-sided ptosis. Head: Atraumatic. Nose: No congestion/rhinnorhea. Mouth/Throat: Left-sided droop of the mouth when smiling.  Mucous membranes are moist.  Oropharynx non-erythematous. Neck: No stridor.   Cardiovascular: Normal rate, regular rhythm. Grossly normal heart sounds.  Good peripheral circulation. Respiratory: Normal respiratory effort.  No retractions. Lungs CTAB. Gastrointestinal: Soft and nontender. No distention. No abdominal bruits. No CVA tenderness. Musculoskeletal: No lower extremity tenderness nor edema.  No joint effusions. Neurologic:  Normal speech and language.  Left-sided mouth droop upon smile.  Left-sided ptosis.  No deviation of the tongue.  No sensory deficits on the face.  Remainder of neurological exam is normal with normal sensory and motor function of upper and lower extremities.  Balance/ataxia not tested due to her distress/vomiting.  NIHSS upon initial evaluation is 1 for facial droop/ptosis. Skin:  Skin is warm, dry and intact. No rash noted. Psychiatric: Mood and affect are normal. Speech and behavior are normal.  ____________________________________________   LABS (all labs ordered are listed, but only abnormal results are displayed)  Labs Reviewed  CBC WITH DIFFERENTIAL/PLATELET - Abnormal; Notable for the following:    WBC 11.3 (*)     RBC 5.89 (*)    MCV 76.0 (*)    MCH 23.3 (*)    MCHC 30.6 (*)    RDW 18.0 (*)    Platelets 508 (*)    Neutro Abs 8.0 (*)    Basophils Absolute 0.3 (*)    All  other components within normal limits  COMPREHENSIVE METABOLIC PANEL - Abnormal; Notable for the following:    Glucose, Bld 202 (*)    GFR calc non Af Amer 55 (*)    All other components within normal limits  LIPASE, BLOOD - Abnormal; Notable for the following:    Lipase 52 (*)    All other components within normal limits  TROPONIN I  APTT  PROTIME-INR  MAGNESIUM  URINALYSIS COMPLETEWITH MICROSCOPIC ([facility] ONLY)  ETHANOL  URINE DRUG SCREEN, QUALITATIVE ([facility] ONLY)  TYPE AND SCREEN   ____________________________________________  EKG  ED ECG REPORT I, Karan Ramnauth, the attending physician, personally viewed and interpreted this ECG.   Date: 12/20/2014  EKG Time: 15:30  Rate: 94  Rhythm: normal sinus rhythm  Axis: Left axis deviation  Intervals:Normal  ST&T Change: Non-specific ST segment / T-wave changes, but no evidence of acute ischemia. LVH.  ____________________________________________  RADIOLOGY  Ct Head Wo Contrast  12/20/2014   CLINICAL DATA:  Pt poor historian. Per nurse left sided facial and eye droop  EXAM: CT HEAD WITHOUT CONTRAST  TECHNIQUE: Contiguous axial images were obtained from the base of the skull through the vertex without intravenous contrast.  COMPARISON:  11/10/2012  FINDINGS: Ventricles normal configuration. There is ventricular and sulcal enlargement reflecting age related volume loss. No hydrocephalus.  There are no parenchymal masses or mass effect. There is no evidence of a recent cortical infarct. Patchy white matter hypoattenuation is noted consistent with moderate chronic microvascular ischemic change.  There are no extra-axial masses or abnormal fluid collections.  There is no intracranial hemorrhage.  Visualized sinuses are clear other than a single opacified right ethmoid air cell.  Clear mastoid air cells.  IMPRESSION: 1. No acute intracranial abnormalities. 2. Age related atrophy. Moderate chronic microvascular ischemic change. These results were called by telephone at the time of interpretation on 12/20/2014 at 3:59 pm to Dr. Hinda Kehr , who verbally acknowledged these results.   Electronically Signed   By: Lajean Manes M.D.   On: 12/20/2014 15:59    ____________________________________________   PROCEDURES  Procedure(s) performed: None  Critical Care performed: Yes, see critical care note(s)  CRITICAL CARE Performed by: Hinda Kehr   Total critical care time: 40 minutes  Critical care time was exclusive of separately billable procedures and treating other patients.  Critical care was necessary to treat or prevent imminent or life-threatening deterioration.  Critical care was time spent personally by me on the following activities: development of treatment plan with patient and/or surrogate as well as nursing, discussions with consultants, evaluation of patient's response to treatment, examination of patient, obtaining history from patient or surrogate, ordering and performing treatments and interventions, ordering and review of laboratory studies, ordering and review of radiographic studies, pulse oximetry and re-evaluation of patient's condition.  ____________________________________________   INITIAL IMPRESSION / ASSESSMENT AND PLAN / ED COURSE  Pertinent labs & imaging results that were available during my care of the patient were reviewed by me and considered in my medical decision making (see chart for details).  Immediately upon her presentation I was concerned for possible cerebrovascular accident/TIA given the acuity of the onset of her symptoms and the intractable nature of her vomiting.  Given her history of polycythemia vera and the fact she cannot remember the last time she had phlebotomy, this further increased my concern.  She is  significantly hypertensive and according to a prior office note her blood pressure is usually in the 347Q systolic.  I  suspect the hypertension's compensation for a CVA.  Her presentation NIH stroke scale is documented below and is mild, but I will proceed with a code stroke including an emergent head CT.   NIH Stroke Scale  Interval: Baseline Time: 3:40 PM Person Administering Scale: Julea Hutto   1a  Level of consciousness: 0=alert; keenly responsive  1b. LOC questions:  0=Performs both tasks correctly  1c. LOC commands: 0=Performs both tasks correctly  2.  Best Gaze: 0=normal  3.  Visual: 0=No visual loss  4. Facial Palsy: 1=Minor paralysis (flattened nasolabial fold, asymmetric on smiling)  5a.  Motor left arm: 0=No drift, limb holds 90 (or 45) degrees for full 10 seconds  5b.  Motor right arm: 0=No drift, limb holds 90 (or 45) degrees for full 10 seconds  6a. motor left leg: 0=No drift, limb holds 90 (or 45) degrees for full 10 seconds  6b  Motor right leg:  0=No drift, limb holds 90 (or 45) degrees for full 10 seconds  7. Limb Ataxia: 0=Absent  8.  Sensory: 0=Normal; no sensory loss  9. Best Language:  0=No aphasia, normal  10. Dysarthria: 0=Normal  11. Extinction and Inattention: 0=No abnormality  12. Distal motor function: 0=Normal   Total:   1   ----------------------------------------- 4:06 PM on 12/20/2014 -----------------------------------------  I, Rainer Mounce, personally discussed the head CT and results by phone with the on-call radiologist and used this discussion as part of my medical decision making; there were no acute findings appreciated by the radiologist.  I have paged Dr. Irish Elders, the on-call neurologist, to discuss the patient with him.  ----------------------------------------- 4:27 PM on 12/20/2014 -----------------------------------------  I spoke with Dr. Irish Elders by phone.  We discussed the case and agreed that I should admit the patient  for further imaging with MRI studies including MRI and MRA and that he would see the patient while she is in the hospital.  We discussed blood pressure management and he recommended that we do not treat it at this time (permissive hypertension) given the concern for maintaining cerebral perfusion pressure if in fact she is having an acute CVA.  We also agree that she is not a candidate for TPA given the relatively mild symptoms with an NIH stroke scale of 1 and an unclear diagnosis that this truly is a result of a CVA; for this patient the risks outweigh the benefits.  After the patient passes the swallow screening I will give aspirin 324 mg by mouth.  I am also giving a small fluid bolus of 500 mL normal saline given her history of polycythemia though her hemoglobin today is within normal limits.  I just reassessed the patient and she is feeling better after the Zofran.  She still feels weak and lightheaded but feels better than prior.  I updated her and her family as to the plan and they understand and agree.  Upon reassessment, her NIH stroke scale remains at 1 for persistent left-sided ptosis and slight left-sided facial droop around the mouth.  ____________________________________________  FINAL CLINICAL IMPRESSION(S) / ED DIAGNOSES  Final diagnoses:  Cerebral infarction due to unspecified mechanism  Secondary hypertension, unspecified      NEW MEDICATIONS STARTED DURING THIS VISIT:  New Prescriptions   No medications on file     Hinda Kehr, MD 12/20/14 1631

## 2014-12-20 NOTE — Progress Notes (Signed)
*  PRELIMINARY RESULTS* Echocardiogram 2D Echocardiogram has been performed.  [name] 12/20/2014, 6:14 PM

## 2014-12-20 NOTE — H&P (Signed)
[facility] [place] NAME: [name]    MR#:  [id number]  DATE OF BIRTH:  10-23-1931  DATE OF ADMISSION:  12/20/2014  PRIMARY CARE PHYSICIAN: Madelyn Brunner, MD   REQUESTING/REFERRING PHYSICIAN: Dr. Enid Skeens  CHIEF COMPLAINT:   Chief Complaint  Patient presents with  . Nausea  . Emesis  . Dizziness    HISTORY OF PRESENT ILLNESS: [name]  is a 81 y.o. female with a known history of hypertension, hyperuricemia, diabetes, microalbuminuria, polycythemia vera, TIA, adenocarcinoma of the cervix- lives independent life, today around 12 noon she suddenly started feeling very dizzy, nauseated and vomiting, and feeling very hot. She called her niece and her adopted son, when son reached over there- he found her very dizzy and weak and in distress so brought to emergency room. In ER to nurse noted her having left-sided facial weakness, and pointed it to the family then they also agreed with that. CT scan of the head was negative for any acute finding, but she is given to hospitalist team for further management and workup for stroke after ER physician spoke to on call neurologist.  PAST MEDICAL HISTORY:   Past Medical History  Diagnosis Date  . Hypertension   . Hyperuricemia   . Diabetes mellitus without complication   . Polycythemia vera   . H/O TIA (transient ischemic attack) and stroke   . Adenocarcinoma in situ of cervix   . H/O compression fracture of spine 2014    thoracic spine  . Microalbuminuria   . Trochanteric bursitis   . Endometrial carcinoma     s/p total abdominal hysterectomy    PAST SURGICAL HISTORY:  Past Surgical History  Procedure Laterality Date  . Tonsillectomy    . Exploratory laparotomy      for fibroids  . Cataract extraction w/ intraocular lens implant Right   . Abdominal hysterectomy      SOCIAL HISTORY:  History  Substance Use Topics  . Smoking status: Former Research scientist (life sciences)  . Smokeless  tobacco: Never Used  . Alcohol Use: No    FAMILY HISTORY:  Family History  Problem Relation Age of Onset  . Heart attack Mother   . Breast cancer Mother   . Heart attack Father   . Heart attack Sister   . Diabetes Sister     DRUG ALLERGIES: No Known Allergies  REVIEW OF SYSTEMS:   CONSTITUTIONAL: No fever, fatigue or weakness. Had excessive dizziness. EYES: No blurred or double vision.  EARS, NOSE, AND THROAT: No tinnitus or ear pain.  RESPIRATORY: No cough, shortness of breath, wheezing or hemoptysis.  CARDIOVASCULAR: No chest pain, orthopnea, edema.  GASTROINTESTINAL:positive for nausea & vomiting, No diarrhea or abdominal pain.  GENITOURINARY: No dysuria, hematuria.  ENDOCRINE: No polyuria, nocturia,  HEMATOLOGY: No anemia, easy bruising or bleeding SKIN: No rash or lesion. MUSCULOSKELETAL: No joint pain or arthritis.   NEUROLOGIC: No tingling, numbness, weakness.  PSYCHIATRY: No anxiety or depression.   MEDICATIONS AT HOME:  Prior to Admission medications   Medication Sig Start Date End Date Taking? Authorizing Provider  alendronate (FOSAMAX) 70 MG tablet Take 70 mg by mouth once a week.    Yes Historical Provider, MD  aspirin EC 81 MG tablet Take 81 mg by mouth daily.   Yes Historical Provider, MD  calcium-vitamin D (OSCAL WITH D) 500-200 MG-UNIT per tablet Take 1 tablet by mouth daily.    Yes Historical Provider, MD  carvedilol (COREG)  3.125 MG tablet Take 3.125 mg by mouth 2 (two) times daily.    Yes Historical Provider, MD  fexofenadine (ALLEGRA) 180 MG tablet Take 180 mg by mouth daily.   Yes Historical Provider, MD  hydroxyurea (HYDREA) 500 MG capsule Take 500 mg by mouth daily.    Yes Historical Provider, MD  metFORMIN (GLUCOPHAGE) 1000 MG tablet Take 1,000 mg by mouth 2 (two) times daily.    Yes Historical Provider, MD  potassium chloride (K-DUR,KLOR-CON) 10 MEQ tablet Take 10 mEq by mouth daily.   Yes Historical Provider, MD  pravastatin (PRAVACHOL) 20 MG  tablet Take 20 mg by mouth at bedtime.    Yes Historical Provider, MD  ramipril (ALTACE) 10 MG capsule Take 10 mg by mouth 2 (two) times daily.    Yes Historical Provider, MD  verapamil (CALAN-SR) 240 MG CR tablet Take 240 mg by mouth daily.   Yes Historical Provider, MD      PHYSICAL EXAMINATION:   VITAL SIGNS: Blood pressure 192/86, pulse 86, temperature 97.3 F (36.3 C), temperature source Oral, resp. rate 17, SpO2 93 %.  GENERAL:  81 y.o.-year-old patient lying in the bed with no acute distress.  EYES: Pupils equal, round, reactive to light and accommodation. No scleral icterus. Extraocular muscles intact.  HEENT: Head atraumatic, normocephalic. Oropharynx and nasopharynx clear.  NECK:  Supple, no jugular venous distention. No thyroid enlargement, no tenderness.  LUNGS: Normal breath sounds bilaterally, no wheezing, rales,rhonchi or crepitation. No use of accessory muscles of respiration.  CARDIOVASCULAR: S1, S2 normal. No murmurs, rubs, or gallops.  ABDOMEN: Soft, nontender, nondistended. Bowel sounds present. No organomegaly or mass.  EXTREMITIES: No pedal edema, cyanosis, or clubbing.  NEUROLOGIC: left sided facial weakness with eyelid droop . Muscle strength 5/5 in all extremities. Sensation intact. Gait not checked.  PSYCHIATRIC: The patient is alert and oriented x 3.  SKIN: No obvious rash, lesion, or ulcer.   LABORATORY PANEL:   CBC  Recent Labs Lab 12/20/14 1533  WBC 11.3*  HGB 13.7  HCT 44.8  PLT 508*  MCV 76.0*  MCH 23.3*  MCHC 30.6*  RDW 18.0*  LYMPHSABS 1.8  MONOABS 0.7  EOSABS 0.4  BASOSABS 0.3*   ------------------------------------------------------------------------------------------------------------------  Chemistries   Recent Labs Lab 12/20/14 1533  NA 140  K 3.7  CL 101  CO2 27  GLUCOSE 202*  BUN 19  CREATININE 0.95  CALCIUM 9.9  MG 1.8  AST 19  ALT 15  ALKPHOS 53  BILITOT 0.4    Coagulation profile  Recent Labs Lab  12/20/14 1533  INR 1.06   ------------------------------------------------------------------------------------------------------------------- No results for input(s): DDIMER in the last 72 hours. -------------------------------------------------------------------------------------------------------------------  Cardiac Enzymes  Recent Labs Lab 12/20/14 1533  TROPONINI <0.03   ------------------------------------------------------------------------------------------------------------------ Invalid input(s): POCBNP  ---------------------------------------------------------------------------------------------------------------  Urinalysis No results found for: COLORURINE, APPEARANCEUR, LABSPEC, PHURINE, GLUCOSEU, HGBUR, BILIRUBINUR, KETONESUR, PROTEINUR, UROBILINOGEN, NITRITE, LEUKOCYTESUR   RADIOLOGY: Ct Head Wo Contrast  12/20/2014   CLINICAL DATA:  Pt poor historian. Per nurse left sided facial and eye droop  EXAM: CT HEAD WITHOUT CONTRAST  TECHNIQUE: Contiguous axial images were obtained from the base of the skull through the vertex without intravenous contrast.  COMPARISON:  11/10/2012  FINDINGS: Ventricles normal configuration. There is ventricular and sulcal enlargement reflecting age related volume loss. No hydrocephalus.  There are no parenchymal masses or mass effect. There is no evidence of a recent cortical infarct. Patchy white matter hypoattenuation is noted consistent with moderate chronic microvascular ischemic change.  There  are no extra-axial masses or abnormal fluid collections.  There is no intracranial hemorrhage.  Visualized sinuses are clear other than a single opacified right ethmoid air cell. Clear mastoid air cells.  IMPRESSION: 1. No acute intracranial abnormalities. 2. Age related atrophy. Moderate chronic microvascular ischemic change. These results were called by telephone at the time of interpretation on 12/20/2014 at 3:59 pm to Dr. Hinda Kehr , who verbally  acknowledged these results.   Electronically Signed   By: Lajean Manes M.D.   On: 12/20/2014 15:59    IMPRESSION AND PLAN:  * TIA We'll admit on telemetry and get stroke workup including MRI, echocardiogram, carotid Doppler. Patient is already taking aspirin I'll give Plavix, statin and check lipid panel. Consult neurology. Permit blood pressure systolic to be around 354.  *Hypertension Continue carvedilol and ramipril  * Hyperlipidemia Continue pravastatin, check lipid panel.  * Diabetes Continue metformin, give insulin sliding scale coverage.  All the records are reviewed and case discussed with ED provider. Management plans discussed with the patient, family and they are in agreement.  CODE STATUS:full    TOTAL TIME TAKING CARE OF THIS PATIENT: 40 minutes.    Vaughan Basta M.D on 12/20/2014   Between 7am to 6pm - Pager - [phone]  After 6pm go to [email or web address] - password [email or web address] [facility]  Office  [phone]  CC: Primary care physician; Madelyn Brunner, MD

## 2014-12-20 NOTE — ED Notes (Signed)
Pt comes in via EMS c/o N/V, and dizziness that started around 12:30. Patient hx of HTN with systolic at 017. Patient has hx of blood disorder

## 2014-12-20 NOTE — ED Notes (Signed)
NIH stroke scale assessed at 1538.

## 2014-12-20 NOTE — Progress Notes (Signed)
Patient arrived to room from ED. No complaints of pain. Vitals charted. Fall risk score documented. Telemetry monitor on. Bed alarm on. Family at bedside.  [name]

## 2014-12-20 NOTE — Progress Notes (Signed)
   12/20/14 1552  Clinical Encounter Type  Visited With Patient;Other (Comment)  Visit Type Code  Referral From Nurse  Consult/Referral To Chaplain  Spiritual Encounters  Spiritual Needs Prayer;Emotional  Received page to go to ED.  Stroke code called.  Pt was returning from CT scan when I entered.  Her caregiver was present in the room and introduced himself.  Pt was responsive, but appeared groggy.  She stated that she was a bit better, but still not feeling real well.  Pt asked me to pray for her which I did.  I also provided compassionate presence and support.  Will inform on call chaplain who follows me about her condition.  May need to follow up depending on pt's condition this evening and tomorrow.  [place] [phone]

## 2014-12-20 NOTE — ED Notes (Signed)
Admitting MD at bedside.

## 2014-12-21 ENCOUNTER — Observation Stay: Payer: Medicare Other

## 2014-12-21 DIAGNOSIS — I63311 Cerebral infarction due to thrombosis of right middle cerebral artery: Secondary | ICD-10-CM

## 2014-12-21 DIAGNOSIS — Z87891 Personal history of nicotine dependence: Secondary | ICD-10-CM | POA: Diagnosis not present

## 2014-12-21 DIAGNOSIS — Z8541 Personal history of malignant neoplasm of cervix uteri: Secondary | ICD-10-CM | POA: Diagnosis not present

## 2014-12-21 DIAGNOSIS — I639 Cerebral infarction, unspecified: Secondary | ICD-10-CM | POA: Diagnosis present

## 2014-12-21 DIAGNOSIS — I159 Secondary hypertension, unspecified: Secondary | ICD-10-CM | POA: Diagnosis present

## 2014-12-21 DIAGNOSIS — R4781 Slurred speech: Secondary | ICD-10-CM | POA: Diagnosis present

## 2014-12-21 DIAGNOSIS — Z8249 Family history of ischemic heart disease and other diseases of the circulatory system: Secondary | ICD-10-CM | POA: Diagnosis not present

## 2014-12-21 DIAGNOSIS — E785 Hyperlipidemia, unspecified: Secondary | ICD-10-CM | POA: Diagnosis present

## 2014-12-21 DIAGNOSIS — E119 Type 2 diabetes mellitus without complications: Secondary | ICD-10-CM | POA: Diagnosis present

## 2014-12-21 DIAGNOSIS — Z7982 Long term (current) use of aspirin: Secondary | ICD-10-CM | POA: Diagnosis not present

## 2014-12-21 LAB — HEMOGLOBIN A1C: Hgb A1c MFr Bld: 7.2 % — ABNORMAL HIGH (ref 4.0–6.0)

## 2014-12-21 LAB — CBC
HCT: 38.9 % (ref 35.0–47.0)
Hemoglobin: 12.2 g/dL (ref 12.0–16.0)
MCH: 23.7 pg — ABNORMAL LOW (ref 26.0–34.0)
MCHC: 31.3 g/dL — ABNORMAL LOW (ref 32.0–36.0)
MCV: 75.6 fL — ABNORMAL LOW (ref 80.0–100.0)
Platelets: 449 10*3/uL — ABNORMAL HIGH (ref 150–440)
RBC: 5.14 MIL/uL (ref 3.80–5.20)
RDW: 17.7 % — ABNORMAL HIGH (ref 11.5–14.5)
WBC: 12.1 10*3/uL — ABNORMAL HIGH (ref 3.6–11.0)

## 2014-12-21 LAB — BASIC METABOLIC PANEL
Anion gap: 9 (ref 5–15)
BUN: 18 mg/dL (ref 6–20)
CO2: 28 mmol/L (ref 22–32)
Calcium: 8.9 mg/dL (ref 8.9–10.3)
Chloride: 103 mmol/L (ref 101–111)
Creatinine, Ser: 0.88 mg/dL (ref 0.44–1.00)
GFR calc Af Amer: >60 mL/min (ref >60)
GFR calc non Af Amer: >60 mL/min (ref >60)
Glucose, Bld: 131 mg/dL — ABNORMAL HIGH (ref 65–99)
Potassium: 3.7 mmol/L (ref 3.5–5.1)
Sodium: 140 mmol/L (ref 135–145)

## 2014-12-21 LAB — GLUCOSE, CAPILLARY
Glucose-Capillary: 170 mg/dL — ABNORMAL HIGH (ref 65–99)
Glucose-Capillary: 138 mg/dL — ABNORMAL HIGH (ref 65–99)
Glucose-Capillary: 178 mg/dL — ABNORMAL HIGH (ref 65–99)
Glucose-Capillary: 127 mg/dL — ABNORMAL HIGH (ref 65–99)

## 2014-12-21 MED ORDER — PRAVASTATIN SODIUM 20 MG PO TABS
20.0000 mg | ORAL_TABLET | Freq: Every day | ORAL | Status: DC
  Administered 2014-12-21 – 2014-12-22 (×2): 20 mg via ORAL
  Filled 2014-12-21 (×2): qty 1

## 2014-12-21 MED ORDER — DOCUSATE SODIUM 100 MG PO CAPS
100.0000 mg | ORAL_CAPSULE | Freq: Two times a day (BID) | ORAL | Status: DC
  Administered 2014-12-21 – 2014-12-23 (×5): 100 mg via ORAL
  Filled 2014-12-21 (×4): qty 1

## 2014-12-21 MED ORDER — BISACODYL 10 MG RE SUPP
10.0000 mg | Freq: Every day | RECTAL | Status: DC
  Administered 2014-12-21 – 2014-12-22 (×2): 10 mg via RECTAL
  Filled 2014-12-21 (×3): qty 1

## 2014-12-21 NOTE — Evaluation (Signed)
Occupational Therapy Evaluation Patient Details Name: GLORIANNA GOTT MRN: [id number] DOB: [birth year] Today's Date: 12/21/2014    History of Present Illness 81 yo female with onset of positive findings of stroke in R temporal and hippocampal areas, with old finding in B basal ganglia areas.  PMHx:  TIA, DM, stroke, HTN, cervical CA in situ   Clinical Impression   This patient is an 81  year old female who came to  [facility] stroke symptoms. She  lives in a  home alone and had been independent with ADL and functional mobility . She does well but is mildly unsteady during ADL tasks. Taught safety during ADL. She would benefit from Occupational Therapy for higher ADL tasks.      Follow Up Recommendations       Equipment Recommendations       Recommendations for Other Services       Precautions / Restrictions Precautions Precautions: Fall Restrictions Weight Bearing Restrictions: No      Mobility Bed Mobility Overal bed mobility: Modified Independent                Transfers Overall transfer level: Modified independent (contact guard) Equipment used: 1 person hand held assist                  Balance Overall balance assessment: Needs assistance           Standing balance-Leahy Scale: Fair Standing balance comment: fair- dynamic standing                            ADL                                         General ADL Comments: Had been independent, now needs contact guard assist for safety during dressing task.     Vision     Perception     Praxis      Pertinent Vitals/Pain Pain Assessment: No/denies pain     Hand Dominance Right   Extremity/Trunk Assessment Upper Extremity Assessment Upper Extremity Assessment:  (R UE 5/5 grip 30 lbs - L UE shoudler flexion 4-/5 bicep 5/5 triceps 4/5 wrist and forearm 5/5 grip 28 lbs sensation normal for steriognosis, touch, temp, and sharp.)        Communication Communication Communication: No difficulties   Cognition Arousal/Alertness: Awake/alert Behavior During Therapy: WFL for tasks assessed/performed Overall Cognitive Status: Within Functional Limits for tasks assessed                     General Comments       Exercises       Shoulder Instructions      Home Living Family/patient expects to be discharged to:: Private residence Living Arrangements: Alone Available Help at Discharge: Family Type of Home: House Home Access: Stairs to enter Technical brewer of Steps: 2 Entrance Stairs-Rails: Right;Left;Can reach both Home Layout: One level               Home Equipment: Cane - single point;Cane - quad   Additional Comments: has older walker      Prior Functioning/Environment Level of Independence: Independent             OT Diagnosis: Generalized weakness   OT Problem List: Decreased strength;Decreased activity tolerance (decreased balance during ADL)   OT  Treatment/Interventions: Self-care/ADL training    OT Goals(Current goals can be found in the care plan section) Acute Rehab OT Goals Patient Stated Goal: to go home  OT Frequency: Min 1X/week   Barriers to D/C:            Co-evaluation              End of Session Equipment Utilized During Treatment:  (stroke test kit)  Activity Tolerance: Patient limited by fatigue Patient left: in bed;with call bell/phone within reach;with bed alarm set;with family/visitor present   Time: 1325-1345 OT Time Calculation (min): 20 min Charges:  OT General Charges $OT Visit: 1 Procedure OT Evaluation $Initial OT Evaluation Tier I: 1 Procedure OT Treatments $Self Care/Home Management : 8-22 mins G-Codes:    Myrene Galas, MS/OTR/L  12/21/2014, 3:55 PM

## 2014-12-21 NOTE — Progress Notes (Addendum)
[facility] NAME: [name]    MR#:  [id number]  DATE OF BIRTH:  [birth year]  SUBJECTIVE:   Came in with dizzy spell at home with slurred speech REVIEW OF SYSTEMS:   Review of Systems  Constitutional: Positive for malaise/fatigue. Negative for fever, chills and weight loss.  HENT: Negative for ear discharge, ear pain and nosebleeds.   Eyes: Negative for blurred vision, pain and discharge.  Respiratory: Negative for sputum production, shortness of breath, wheezing and stridor.   Cardiovascular: Negative for chest pain, palpitations, orthopnea and PND.  Gastrointestinal: Negative for nausea, vomiting, abdominal pain and diarrhea.  Genitourinary: Negative for urgency and frequency.  Musculoskeletal: Negative for back pain and joint pain.  Neurological: Positive for speech change and weakness. Negative for sensory change and focal weakness.  Psychiatric/Behavioral: Negative for depression and hallucinations. The patient is not nervous/anxious.   All other systems reviewed and are negative.  Tolerating Diet:yes Tolerating PT: rec rehab  DRUG ALLERGIES:  No Known Allergies  VITALS:  Blood pressure 138/72, pulse 71, temperature 97.7 F (36.5 C), temperature source Oral, resp. rate 19, weight 60.056 kg (132 lb 6.4 oz), SpO2 96 %.  PHYSICAL EXAMINATION:   Physical Exam  GENERAL:  81 y.o.-year-old patient lying in the bed with no acute distress.  EYES: Pupils equal, round, reactive to light and accommodation. No scleral icterus. Extraocular muscles intact.  HEENT: Head atraumatic, normocephalic. Oropharynx and nasopharynx clear.  NECK:  Supple, no jugular venous distention. No thyroid enlargement, no tenderness.  LUNGS: Normal breath sounds bilaterally, no wheezing, rales, rhonchi. No use of accessory muscles of respiration.  CARDIOVASCULAR: S1, S2 normal. No murmurs, rubs, or gallops.  ABDOMEN: Soft, nontender,  nondistended. Bowel sounds present. No organomegaly or mass.  EXTREMITIES: No cyanosis, clubbing or edema b/l.    NEUROLOGIC: Cranial nerves II through XII are intact. No focal Motor or sensory deficits b/l.  Power 4/5 all extremities PSYCHIATRIC: The patient is alert and oriented x 3.  SKIN: No obvious rash, lesion, or ulcer.    LABORATORY PANEL:   CBC  Recent Labs Lab 12/21/14 0513  WBC 12.1*  HGB 12.2  HCT 38.9  PLT 449*    Chemistries   Recent Labs Lab 12/20/14 1533 12/21/14 0513  NA 140 140  K 3.7 3.7  CL 101 103  CO2 27 28  GLUCOSE 202* 131*  BUN 19 18  CREATININE 0.95 0.88  CALCIUM 9.9 8.9  MG 1.8  --   AST 19  --   ALT 15  --   ALKPHOS 53  --   BILITOT 0.4  --     Cardiac Enzymes  Recent Labs Lab 12/20/14 1533  TROPONINI <0.03    RADIOLOGY:  Ct Head Wo Contrast  12/20/2014   CLINICAL DATA:  Pt poor historian. Per nurse left sided facial and eye droop  EXAM: CT HEAD WITHOUT CONTRAST  TECHNIQUE: Contiguous axial images were obtained from the base of the skull through the vertex without intravenous contrast.  COMPARISON:  11/10/2012  FINDINGS: Ventricles normal configuration. There is ventricular and sulcal enlargement reflecting age related volume loss. No hydrocephalus.  There are no parenchymal masses or mass effect. There is no evidence of a recent cortical infarct. Patchy white matter hypoattenuation is noted consistent with moderate chronic microvascular ischemic change.  There are no extra-axial masses or abnormal fluid collections.  There is no intracranial hemorrhage.  Visualized sinuses are clear other than a  single opacified right ethmoid air cell. Clear mastoid air cells.  IMPRESSION: 1. No acute intracranial abnormalities. 2. Age related atrophy. Moderate chronic microvascular ischemic change. These results were called by telephone at the time of interpretation on 12/20/2014 at 3:59 pm to Dr. Hinda Kehr , who verbally acknowledged these results.    Electronically Signed   By: Lajean Manes M.D.   On: 12/20/2014 15:59   Mr Jodene Nam Head Wo Contrast  12/21/2014   CLINICAL DATA:  Confusion.  Dizziness.  Left-sided weakness.  EXAM: MRI HEAD WITHOUT CONTRAST  MRA HEAD WITHOUT CONTRAST  TECHNIQUE: Multiplanar, multiecho pulse sequences of the brain and surrounding structures were obtained without intravenous contrast. Angiographic images of the head were obtained using MRA technique without contrast.  COMPARISON:  CT head without contrast 12/20/2014.  FINDINGS: MRI HEAD FINDINGS  The diffusion-weighted images demonstrate acute nonhemorrhagic 9 mm infarct within the right hippocampus. Remote lacunar infarcts are present in the basal ganglia bilaterally. Moderate generalized atrophy is present. Diffuse periventricular and subcortical T2 changes are noted bilaterally. With T2 changes are present within the thalami bilaterally. T2 changes extend into the brainstem.  The left vertebral artery is small. Flow is present in the major intracranial arteries.  A right lens replacement is noted. The left lens is intact. The paranasal sinuses and mastoid air cells are clear.  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 Midline structures are otherwise normal.  MRA HEAD FINDINGS  Tortuosity is noted in the cervical right ICA without significant stenosis. There is minimal irregularity within the cavernous internal carotid artery on the left without a significant stenosis. The internal carotid arteries are otherwise within normal limits. The A1 and M1 segments are normal. The anterior communicating artery is patent.  The MCA bifurcations are within normal limits. The ACA and MCA branch vessels are within normal limits.  The right vertebral artery is the dominant vessel. Left vertebral artery is hypoplastic. The vertebrobasilar junction is intact. The right PICA origin is visualized and normal. A prominent right AICA vessel is noted as well. The left PICA is unremarkable. The  basilar artery is within normal limits. Both posterior cerebral arteries originate from the basilar tip. There is mild attenuation distal PCA branch vessels.  IMPRESSION: 1. Acute nonhemorrhagic infarct within the medial right temporal lobe involving the right hippocampus. 2. The MRA demonstrates mild distal small vessel disease without significant proximal stenosis, aneurysm, or branch vessel occlusion. 3. Remote lacunar infarcts are noted within the basal ganglia bilaterally. 4. Advanced periventricular and subcortical white matter disease bilaterally is consistent with microvascular ischemic change. These results will be called to the ordering clinician or representative by the Radiologist Assistant, and communication documented in the PACS or zVision Dashboard.   Electronically Signed   By: San Morelle M.D.   On: 12/21/2014 08:35   Mr Brain Wo Contrast  12/21/2014   CLINICAL DATA:  Confusion.  Dizziness.  Left-sided weakness.  EXAM: MRI HEAD WITHOUT CONTRAST  MRA HEAD WITHOUT CONTRAST  TECHNIQUE: Multiplanar, multiecho pulse sequences of the brain and surrounding structures were obtained without intravenous contrast. Angiographic images of the head were obtained using MRA technique without contrast.  COMPARISON:  CT head without contrast 12/20/2014.  FINDINGS: MRI HEAD FINDINGS  The diffusion-weighted images demonstrate acute nonhemorrhagic 9 mm infarct within the right hippocampus. Remote lacunar infarcts are present in the basal ganglia bilaterally. Moderate generalized atrophy is present. Diffuse periventricular and subcortical T2 changes are noted bilaterally. With T2 changes are present  within the thalami bilaterally. T2 changes extend into the brainstem.  The left vertebral artery is small. Flow is present in the major intracranial arteries.  A right lens replacement is noted. The left lens is intact. The paranasal sinuses and mastoid air cells are clear.  A calcified pineal cyst measures less  than 1 cm. Midline structures are otherwise normal.  MRA HEAD FINDINGS  Tortuosity is noted in the cervical right ICA without significant stenosis. There is minimal irregularity within the cavernous internal carotid artery on the left without a significant stenosis. The internal carotid arteries are otherwise within normal limits. The A1 and M1 segments are normal. The anterior communicating artery is patent.  The MCA bifurcations are within normal limits. The ACA and MCA branch vessels are within normal limits.  The right vertebral artery is the dominant vessel. Left vertebral artery is hypoplastic. The vertebrobasilar junction is intact. The right PICA origin is visualized and normal. A prominent right AICA vessel is noted as well. The left PICA is unremarkable. The basilar artery is within normal limits. Both posterior cerebral arteries originate from the basilar tip. There is mild attenuation distal PCA branch vessels.  IMPRESSION: 1. Acute nonhemorrhagic infarct within the medial right temporal lobe involving the right hippocampus. 2. The MRA demonstrates mild distal small vessel disease without significant proximal stenosis, aneurysm, or branch vessel occlusion. 3. Remote lacunar infarcts are noted within the basal ganglia bilaterally. 4. Advanced periventricular and subcortical white matter disease bilaterally is consistent with microvascular ischemic change. These results will be called to the ordering clinician or representative by the Radiologist Assistant, and communication documented in the PACS or zVision Dashboard.   Electronically Signed   By: San Morelle M.D.   On: 12/21/2014 08:35   US Carotid Bilateral  12/21/2014   CLINICAL DATA:  Right temporal cerebral vascular accident  EXAM: BILATERAL CAROTID DUPLEX ULTRASOUND  TECHNIQUE: Pearline Cables scale imaging, color Doppler and duplex ultrasound were performed of bilateral carotid and vertebral arteries in the neck.  COMPARISON:  Brain MRI 12/21/2014   FINDINGS: Criteria: Quantification of carotid stenosis is based on velocity parameters that correlate the residual internal carotid diameter with NASCET-based stenosis levels, using the diameter of the distal internal carotid lumen as the denominator for stenosis measurement.  The following velocity measurements were obtained:  RIGHT  ICA:  89/27 cm/sec  CCA:  78/29 cm/sec  SYSTOLIC ICA/CCA RATIO:  1.3  DIASTOLIC ICA/CCA RATIO:  1.9  ECA:  67 cm/sec  LEFT  ICA:  79/18 cm/sec  CCA:  56/21 cm/sec  SYSTOLIC ICA/CCA RATIO:  1.1  DIASTOLIC ICA/CCA RATIO:  1.3  ECA:  62 cm/sec  RIGHT CAROTID ARTERY: No significant atherosclerotic plaque or evidence of stenosis.  RIGHT VERTEBRAL ARTERY:  Patent with normal antegrade flow.  LEFT CAROTID ARTERY: No significant atherosclerotic plaque or evidence of stenosis.  LEFT VERTEBRAL ARTERY:  Patent with normal antegrade flow.  IMPRESSION: Negative bilateral carotid duplex ultrasound.  Signed,  Criselda Peaches, MD  [facility]  [facility]   Electronically Signed   By: Jacqulynn Cadet M.D.   On: 12/21/2014 09:03     ASSESSMENT AND PLAN:  81 y.o. female with a known history of hypertension, hyperuricemia, diabetes, microalbuminuria, polycythemia vera, TIA, adenocarcinoma of the cervix- lives independent life, today around 12 noon she suddenly started feeling very dizzy, nauseated and vomiting, and feeling very hot.she was found to have   * Acute CVA Remains in NSR -asa + plavix(added) MRI  brain positive for Acute nonhemorrhagic infarct within the medial right temporal lobe involving the right hippocampus.  Consult neurology. Permit blood pressure systolic to be around 641. -carotid doppler negative for stenosis  *Hypertension Continue carvedilol and ramipril  * Hyperlipidemia Continue pravastatin  * Diabetes Continue metformin, give insulin sliding scale coverage.  *PT recommends rehab -will try to get her to  rehab if possible over the weekend  Case discussed with Care Management/Social Worker. Management plans discussed with the patient, family and they are in agreement.  CODE STATUS: Full  DVT Prophylaxis: lovenox  TOTAL TIME TAKING CARE OF THIS PATIENT: 35 minutes.  >50% time spent on counselling and coordination of care  POSSIBLE D/C IN 1-2 DAYS, DEPENDING ON CLINICAL CONDITION.   Theresa Wedel M.D on 12/21/2014 at 4:15 PM  Between 7am to 6pm - Pager - [phone]  After 6pm go to [email or web address] - password [email or web address] [facility]  Office  [phone]  CC: Primary care physician; Madelyn Brunner, MD

## 2014-12-21 NOTE — Evaluation (Signed)
Clinical/Bedside Swallow Evaluation Patient Details  Name: [name] MRN: [id number] Date of Birth: 02-Jan-1932  Today's Date: 12/21/2014 Time: SLP Start Time (ACUTE ONLY): 1315 SLP Stop Time (ACUTE ONLY): 1353 SLP Time Calculation (min) (ACUTE ONLY): 38 min  Past Medical History:  Past Medical History  Diagnosis Date  . Hypertension   . Hyperuricemia   . Diabetes mellitus without complication   . Polycythemia vera   . H/O TIA (transient ischemic attack) and stroke   . Adenocarcinoma in situ of cervix   . H/O compression fracture of spine 2014    thoracic spine  . Microalbuminuria   . Trochanteric bursitis   . Endometrial carcinoma     s/p total abdominal hysterectomy   Past Surgical History:  Past Surgical History  Procedure Laterality Date  . Tonsillectomy    . Exploratory laparotomy      for fibroids  . Cataract extraction w/ intraocular lens implant Right   . Abdominal hysterectomy     HPI:  [name] is an 81 y.o. female with a known history of hypertension, hyperuricemia, diabetes, microalbuminuria, polycythemia vera, TIA, adenocarcinoma of the cervix- lives independent life,presentes with dysarthria and L facial droop. Pt was on ASA 81 mg daily at home. Pt was found to have R temp infarct.    Assessment / Plan / Recommendation Clinical Impression  Pt presents w/no apparent oropharyngeal dyspagia and min risk of aspiration d/t recent CVA. Aspiration risk is decreased with used of Aspiration precautions. Pt observed to have slight left facial droop and slight left labial weakness, this weakness did not affect swallow function. No pocketing or holding was observed. Recommend Regular diet /thin liquids and aspiration precautions. Educated pt on "ooo/eee" exericse of labial ROM and strength. Pt apparently arrived w/slurred speech, however this appears to have resolved. Pt also reported some hoarseness initially but this has also improved. Will f/u in 1-3 days.     Aspiration Risk  Mild    Diet Recommendation Age appropriate regular solids;Thin   Medication Administration: Whole meds with liquid Compensations: Slow rate;Small sips/bites    Other  Recommendations Oral Care Recommendations: Patient independent with oral care   Follow Up Recommendations       Frequency and Duration min 1 x/week  1 week   Pertinent Vitals/Pain Denies pain    SLP Swallow Goals     Swallow Study Prior Functional Status  Type of Home: House Available Help at Discharge: Family    General Date of Onset: 12/21/14 Other Pertinent Information: [name] is an 81 y.o. female with a known history of hypertension, hyperuricemia, diabetes, microalbuminuria, polycythemia vera, TIA, adenocarcinoma of the cervix- lives independent life,presentes with dysarthria and L facial droop. Pt was on ASA 81 mg daily at home. Pt was found to have R temp infarct.  Type of Study: Bedside swallow evaluation Diet Prior to this Study: Regular;Thin liquids Temperature Spikes Noted: N/A Respiratory Status: Room air History of Recent Intubation: No Behavior/Cognition: Alert;Cooperative;Pleasant mood Oral Cavity - Dentition: Adequate natural dentition/normal for age Self-Feeding Abilities: Able to feed self Patient Positioning: Upright in bed Baseline Vocal Quality: Other (comment) (Notice mimimal hoarseness) Volitional Swallow: Able to elicit    Oral/Motor/Sensory Function Overall Oral Motor/Sensory Function: Appears within functional limits for tasks assessed Labial ROM: Reduced left Labial Symmetry: Abnormal symmetry left Labial Strength: Within Functional Limits Labial Sensation: Within Functional Limits Lingual ROM: Within Functional Limits Lingual Symmetry: Within Functional Limits Lingual Strength: Within Functional Limits Lingual Sensation: Within Functional  Limits Facial ROM: Within Functional Limits Facial Symmetry: Within Functional Limits Facial Strength:  Within Functional Limits Facial Sensation: Within Functional Limits Velum: Within Functional Limits Mandible: Within Functional Limits   Ice Chips Ice chips: Not tested   Thin Liquid Thin Liquid: Within functional limits Presentation: Cup;Straw;Self Fed Other Comments: 4 ounes of thin liquids without overt s/s of aspiration    Nectar Thick Nectar Thick Liquid: Not tested   Honey Thick Honey Thick Liquid: Not tested   Puree Puree: Within functional limits Presentation: Self Fed;Spoon Other Comments: 4 tsps of puree w/out overt s/s of aspiration, no pocketing or holding observed   Solid   GO Functional Assessment Tool Used: Clinical judgement Functional Limitations: Swallowing Swallow Current Status (R3202): 0 percent impaired, limited or restricted Swallow Goal Status (B3435): 0 percent impaired, limited or restricted  Solid: Within functional limits Presentation: Self Fed Other Comments: 2 bites of solid, no overt s/s of aspiration, good A-P transit, mastication and oral clearing       [place],[name] 12/21/2014,2:07 PM

## 2014-12-21 NOTE — Progress Notes (Signed)
Neuro checks are stable. Ambulating to BR and tolerated it well. NSR. Room air. Pt has not reported any pain. FS are stable. Family at the bedside. MRI was positive. OT, PT, ST have seen pt. Pt has no further concerns at this time.

## 2014-12-21 NOTE — Clinical Social Work Note (Signed)
Clinical Social Work Assessment  Patient Details  Name: [name] MRN: [id number] Date of Birth: 30-Jun-1931  Date of referral:  12/21/14               Reason for consult:  Facility Placement                Permission sought to share information with:  Facility Sport and exercise psychologist, Family Supports Permission granted to share information::  Yes, Verbal Permission Granted  Name::        Agency::     Relationship::     Contact Information:     Housing/Transportation Living arrangements for the past 2 months:  Single Family Home Source of Information:  Patient Patient Interpreter Needed:  None Criminal Activity/Legal Involvement Pertinent to Current Situation/Hospitalization:  No - Comment as needed Significant Relationships:  Other Family Members Lives with:  Self Do you feel safe going back to the place where you live?  Yes Need for family participation in patient care:  Yes (Comment)  Care giving concerns:  Pt was concerned about where she would go to STR.  CSW explained that she would do a bed search and let the pt know who was able to offer.    Social Worker assessment / plan:  CSW spoke to pt.  She was in the room speaking with her family.  Her nominated next of kin was her brother Jenny Reichmann- [phone], and his wife June [phone].  Pt is able to make her own decisions and would prefer to go home but is willing to go to STR if needed.  She confirmed that she lived alone.  Her family is out of town but would be willing to participate via phone if needed.  Pt would prefer [facility] if possible, however due the holiday weekend, pt may not be able to DC to this facility.  DC summary is needed and pt only recently arrived at the hospital.    Employment status:  Retired Forensic scientist:  Medicare PT Recommendations:  [facility] / Referral to community resources:     Patient/Family's Response to care:  Pt was in agreement with SNF.     Patient/Family's Understanding of and Emotional Response to Diagnosis, Current Treatment, and Prognosis:  Pt understood the recommendation for SNF and is in agreement with going if needed but would prefer to go home.  Emotional Assessment Appearance:  Appears stated age Attitude/Demeanor/Rapport:   (pleasent ) Affect (typically observed):   (normal) Orientation:  Oriented to Self, Oriented to Place, Oriented to  Time, Oriented to Situation Alcohol / Substance use:  Never Used Psych involvement (Current and /or in the community):  No (Comment)  Discharge Needs  Concerns to be addressed:    Readmission within the last 30 days:  No Current discharge risk:  Physical Impairment Barriers to Discharge:      Mathews Argyle, LCSW 12/21/2014, 2:56 PM

## 2014-12-21 NOTE — Care Management (Signed)
Patient presents from home

## 2014-12-21 NOTE — Evaluation (Signed)
Physical Therapy Evaluation Patient Details Name: [name] MRN: [id number] DOB: 09/27/1931 Today's Date: 12/21/2014   History of Present Illness  82 yo female with onset of positive findings of stroke in R temporal and hippocampal areas, with old finding in B basal ganglia areas.  PMHx:  TIA, DM, stroke, HTN, cervical CA in situ  Clinical Impression  Pt was able to walk with assistance but has no family assist at home.  Her plan is to try to go to SNF as she is not safe to walk alone and has supportive but unavailable family.  Will try to work on stairs and high level balance skills prior to the dc to SNF.    Follow Up Recommendations SNF    Equipment Recommendations  None recommended by PT    Recommendations for Other Services       Precautions / Restrictions Precautions Precautions: Fall;Other (comment) (telemetry) Restrictions Weight Bearing Restrictions: No      Mobility  Bed Mobility Overal bed mobility: Modified Independent                Transfers Overall transfer level: Modified independent Equipment used: 1 person hand held assist                Ambulation/Gait Ambulation/Gait assistance: Min guard;Min assist Ambulation Distance (Feet): 300 Feet Assistive device: 1 person hand held assist Gait Pattern/deviations: Step-through pattern;Decreased weight shift to left;Drifts right/left;Wide base of support Gait velocity: reduced Gait velocity interpretation: Below normal speed for age/gender General Gait Details: listing to R and limited tolerance for controlling around corners without the [facility]  Stairs            Wheelchair Mobility    Modified Rankin (Stroke Patients Only)       Balance Overall balance assessment: Needs assistance           Standing balance-Leahy Scale: Fair Standing balance comment: fair- dynamic standing                             Pertinent Vitals/Pain Pain Assessment: No/denies pain    Home  Living Family/patient expects to be discharged to:: Private residence Living Arrangements: Alone Available Help at Discharge: Family Type of Home: House Home Access: Stairs to enter Entrance Stairs-Rails: Right;Left;Can reach both Entrance Stairs-Number of Steps: 2 Home Layout: One level Home Equipment: Cane - single point;Cane - quad Additional Comments: has older walker    Prior Function Level of Independence: Independent               Hand Dominance        Extremity/Trunk Assessment   Upper Extremity Assessment: Overall WFL for tasks assessed           Lower Extremity Assessment: Overall WFL for tasks assessed      Cervical / Trunk Assessment: Normal  Communication   Communication: No difficulties  Cognition Arousal/Alertness: Awake/alert Behavior During Therapy: WFL for tasks assessed/performed Overall Cognitive Status: Within Functional Limits for tasks assessed                      General Comments General comments (skin integrity, edema, etc.): Pt is unsteady minimally with gait and needs to use SPC or quad cane to control her balance.  Pt is very much not worried about this but is unsafe to walk alone.  Will recommend HHPT and pt is thinking about the need for this despite her positive CT  findings of stroke    Exercises        Assessment/Plan    PT Assessment Patient needs continued PT services  PT Diagnosis Abnormality of gait   PT Problem List Decreased strength;Decreased activity tolerance;Decreased balance;Decreased mobility;Decreased coordination;Decreased safety awareness;Cardiopulmonary status limiting activity  PT Treatment Interventions DME instruction;Gait training;Stair training;Functional mobility training;Therapeutic activities;Therapeutic exercise;Balance training;Neuromuscular re-education;Patient/family education   PT Goals (Current goals can be found in the Care Plan section) Acute Rehab PT Goals Patient Stated Goal: to go  home PT Goal Formulation: With patient/family Time For Goal Achievement: 01/04/15 Potential to Achieve Goals: Good    Frequency Min 2X/week   Barriers to discharge Inaccessible home environment;Decreased caregiver support      Co-evaluation               End of Session Equipment Utilized During Treatment: Gait belt Activity Tolerance: Patient tolerated treatment well Patient left: in bed;with call bell/phone within reach;with bed alarm set;with family/visitor present (sitting bedside to eat lunch wiht family) Nurse Communication: Mobility status    Functional Assessment Tool Used: clinical judgment Functional Limitation: Mobility: Walking and moving around Mobility: Walking and Moving Around Current Status ([id number]): At least 20 percent but less than 40 percent impaired, limited or restricted Mobility: Walking and Moving Around Goal Status [phone]): At least 20 percent but less than 40 percent impaired, limited or restricted    Time: 7290-2111 PT Time Calculation (min) (ACUTE ONLY): 32 min   Charges:   PT Evaluation $Initial PT Evaluation Tier I: 1 Procedure PT Treatments $Gait Training: 8-22 mins   PT G Codes:   PT G-Codes **NOT FOR INPATIENT CLASS** Functional Assessment Tool Used: clinical judgment Functional Limitation: Mobility: Walking and moving around Mobility: Walking and Moving Around Current Status (B5208): At least 20 percent but less than 40 percent impaired, limited or restricted Mobility: Walking and Moving Around Goal Status [phone]): At least 20 percent but less than 40 percent impaired, limited or restricted    Ramond Dial 12/21/2014, 1:05 PM   Mee Hives, PT MS Acute Rehab Dept. Number: [facility] [id number] and [place] [phone]

## 2014-12-21 NOTE — Consult Note (Signed)
CC: L sided facial weakness   HPI: [name] is an 81 y.o. female  with a known history of hypertension, hyperuricemia, diabetes, microalbuminuria, polycythemia vera, TIA, adenocarcinoma of the cervix- lives independent life,presentes with dysarthria and L facial droop.  Pt was on ASA 81 mg daily at home.  Pt was found to have R temp infarct.    Past Medical History  Diagnosis Date  . Hypertension   . Hyperuricemia   . Diabetes mellitus without complication   . Polycythemia vera   . H/O TIA (transient ischemic attack) and stroke   . Adenocarcinoma in situ of cervix   . H/O compression fracture of spine 2014    thoracic spine  . Microalbuminuria   . Trochanteric bursitis   . Endometrial carcinoma     s/p total abdominal hysterectomy    Past Surgical History  Procedure Laterality Date  . Tonsillectomy    . Exploratory laparotomy      for fibroids  . Cataract extraction w/ intraocular lens implant Right   . Abdominal hysterectomy      Family History  Problem Relation Age of Onset  . Heart attack Mother   . Breast cancer Mother   . Heart attack Father   . Heart attack Sister   . Diabetes Sister     Social History:  reports that she has quit smoking. She has never used smokeless tobacco. She reports that she does not drink alcohol or use illicit drugs.  No Known Allergies  Medications: I have reviewed the patient's current medications.  ROS: History obtained from the patient  General ROS: negative for - chills, fatigue, fever, night sweats, weight gain or weight loss Psychological ROS: negative for - behavioral disorder, hallucinations, memory difficulties, mood swings or suicidal ideation Ophthalmic ROS: negative for - blurry vision, double vision, eye pain or loss of vision ENT ROS: negative for - epistaxis, nasal discharge, oral lesions, sore throat, tinnitus or vertigo Allergy and Immunology ROS: negative for - hives or itchy/watery eyes Hematological and  Lymphatic ROS: negative for - bleeding problems, bruising or swollen lymph nodes Endocrine ROS: negative for - galactorrhea, hair pattern changes, polydipsia/polyuria or temperature intolerance Respiratory ROS: negative for - cough, hemoptysis, shortness of breath or wheezing Cardiovascular ROS: negative for - chest pain, dyspnea on exertion, edema or irregular heartbeat Gastrointestinal ROS: negative for - abdominal pain, diarrhea, hematemesis, nausea/vomiting or stool incontinence Genito-Urinary ROS: negative for - dysuria, hematuria, incontinence or urinary frequency/urgency Musculoskeletal ROS: negative for - joint swelling or muscular weakness Neurological ROS: as noted in HPI Dermatological ROS: negative for rash and skin lesion changes  Physical Examination: Blood pressure 138/72, pulse 71, temperature 97.7 F (36.5 C), temperature source Oral, resp. rate 19, weight 60.056 kg (132 lb 6.4 oz), SpO2 96 %.  HEENT-  Normocephalic, no lesions, without obvious abnormality.  Normal external eye and conjunctiva.  Normal TM's bilaterally.  Normal auditory canals and external ears. Normal external nose, mucus membranes and septum.  Normal pharynx.   Neurological Examination Mental Status: Slight dysarthria Cranial Nerves: II: Discs flat bilaterally; Visual fields grossly normal, pupils equal, round, reactive to light and accommodation III,IV, VI: ptosis not present, extra-ocular motions intact bilaterally V,VII: L facial droop.  VIII: hearing normal bilaterally IX,X: gag reflex present XI: bilateral shoulder shrug XII: midline tongue extension Motor: Right : Upper extremity   5/5    Left:     Upper extremity   5/5  Lower extremity   5/5  Lower extremity   5/5 Tone and bulk:normal tone throughout; no atrophy noted Sensory: Pinprick and light touch intact throughout, bilaterally Deep Tendon Reflexes: 1+ and symmetric throughout Plantars: Right: downgoing   Left:  downgoing Cerebellar: normal finger-to-nose, normal rapid alternating movements and normal heel-to-shin test Gait: not tested.       Laboratory Studies:   Basic Metabolic Panel:  Recent Labs Lab 12/20/14 1533 12/21/14 0513  NA 140 140  K 3.7 3.7  CL 101 103  CO2 27 28  GLUCOSE 202* 131*  BUN 19 18  CREATININE 0.95 0.88  CALCIUM 9.9 8.9  MG 1.8  --     Liver Function Tests:  Recent Labs Lab 12/20/14 1533  AST 19  ALT 15  ALKPHOS 53  BILITOT 0.4  PROT 7.6  ALBUMIN 4.6    Recent Labs Lab 12/20/14 1533  LIPASE 52*   No results for input(s): AMMONIA in the last 168 hours.  CBC:  Recent Labs Lab 12/20/14 1533 12/21/14 0513  WBC 11.3* 12.1*  NEUTROABS 8.0*  --   HGB 13.7 12.2  HCT 44.8 38.9  MCV 76.0* 75.6*  PLT 508* 449*    Cardiac Enzymes:  Recent Labs Lab 12/20/14 1533  TROPONINI <0.03    BNP: Invalid input(s): POCBNP  CBG:  Recent Labs Lab 12/21/14 0932 12/21/14 1202  GLUCAP 170* 138*    Microbiology: No results found for this or any previous visit.  Coagulation Studies:  Recent Labs  12/20/14 1533  LABPROT 14.0  INR 1.06    Urinalysis:  Recent Labs Lab 12/20/14 1639  COLORURINE STRAW*  LABSPEC 1.009  PHURINE 8.0  GLUCOSEU 50*  HGBUR NEGATIVE  BILIRUBINUR NEGATIVE  KETONESUR NEGATIVE  PROTEINUR 100*  NITRITE NEGATIVE  LEUKOCYTESUR NEGATIVE    Lipid Panel:     Component Value Date/Time   CHOL 148 12/20/2014 1533   TRIG 130 12/20/2014 1533   HDL 44 12/20/2014 1533   CHOLHDL 3.4 12/20/2014 1533   VLDL 26 12/20/2014 1533   LDLCALC 78 12/20/2014 1533    HgbA1C:  Lab Results  Component Value Date   HGBA1C 7.2* 12/20/2014    Urine Drug Screen:     Component Value Date/Time   LABOPIA NONE DETECTED 12/20/2014 1639   LABBENZ NONE DETECTED 12/20/2014 1639   AMPHETMU NONE DETECTED 12/20/2014 1639   THCU NONE DETECTED 12/20/2014 1639   LABBARB NONE DETECTED 12/20/2014 1639    Alcohol Level:  Recent  Labs Lab 12/20/14 1533  ETH <5    Other results: EKG: normal EKG, normal sinus rhythm, unchanged from previous tracings.  Imaging: Ct Head Wo Contrast  12/20/2014   CLINICAL DATA:  Pt poor historian. Per nurse left sided facial and eye droop  EXAM: CT HEAD WITHOUT CONTRAST  TECHNIQUE: Contiguous axial images were obtained from the base of the skull through the vertex without intravenous contrast.  COMPARISON:  11/10/2012  FINDINGS: Ventricles normal configuration. There is ventricular and sulcal enlargement reflecting age related volume loss. No hydrocephalus.  There are no parenchymal masses or mass effect. There is no evidence of a recent cortical infarct. Patchy white matter hypoattenuation is noted consistent with moderate chronic microvascular ischemic change.  There are no extra-axial masses or abnormal fluid collections.  There is no intracranial hemorrhage.  Visualized sinuses are clear other than a single opacified right ethmoid air cell. Clear mastoid air cells.  IMPRESSION: 1. No acute intracranial abnormalities. 2. Age related atrophy. Moderate chronic microvascular ischemic change. These results were called by  telephone at the time of interpretation on 12/20/2014 at 3:59 pm to Dr. Hinda Kehr , who verbally acknowledged these results.   Electronically Signed   By: Lajean Manes M.D.   On: 12/20/2014 15:59   Mr Jodene Nam Head Wo Contrast  12/21/2014   CLINICAL DATA:  Confusion.  Dizziness.  Left-sided weakness.  EXAM: MRI HEAD WITHOUT CONTRAST  MRA HEAD WITHOUT CONTRAST  TECHNIQUE: Multiplanar, multiecho pulse sequences of the brain and surrounding structures were obtained without intravenous contrast. Angiographic images of the head were obtained using MRA technique without contrast.  COMPARISON:  CT head without contrast 12/20/2014.  FINDINGS: MRI HEAD FINDINGS  The diffusion-weighted images demonstrate acute nonhemorrhagic 9 mm infarct within the right hippocampus. Remote lacunar infarcts are  present in the basal ganglia bilaterally. Moderate generalized atrophy is present. Diffuse periventricular and subcortical T2 changes are noted bilaterally. With T2 changes are present within the thalami bilaterally. T2 changes extend into the brainstem.  The left vertebral artery is small. Flow is present in the major intracranial arteries.  A right lens replacement is noted. The left lens is intact. The paranasal sinuses and mastoid air cells are clear.  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 Midline structures are otherwise normal.  MRA HEAD FINDINGS  Tortuosity is noted in the cervical right ICA without significant stenosis. There is minimal irregularity within the cavernous internal carotid artery on the left without a significant stenosis. The internal carotid arteries are otherwise within normal limits. The A1 and M1 segments are normal. The anterior communicating artery is patent.  The MCA bifurcations are within normal limits. The ACA and MCA branch vessels are within normal limits.  The right vertebral artery is the dominant vessel. Left vertebral artery is hypoplastic. The vertebrobasilar junction is intact. The right PICA origin is visualized and normal. A prominent right AICA vessel is noted as well. The left PICA is unremarkable. The basilar artery is within normal limits. Both posterior cerebral arteries originate from the basilar tip. There is mild attenuation distal PCA branch vessels.  IMPRESSION: 1. Acute nonhemorrhagic infarct within the medial right temporal lobe involving the right hippocampus. 2. The MRA demonstrates mild distal small vessel disease without significant proximal stenosis, aneurysm, or branch vessel occlusion. 3. Remote lacunar infarcts are noted within the basal ganglia bilaterally. 4. Advanced periventricular and subcortical white matter disease bilaterally is consistent with microvascular ischemic change. These results will be called to the ordering clinician or  representative by the Radiologist Assistant, and communication documented in the PACS or zVision Dashboard.   Electronically Signed   By: San Morelle M.D.   On: 12/21/2014 08:35   Mr Brain Wo Contrast  12/21/2014   CLINICAL DATA:  Confusion.  Dizziness.  Left-sided weakness.  EXAM: MRI HEAD WITHOUT CONTRAST  MRA HEAD WITHOUT CONTRAST  TECHNIQUE: Multiplanar, multiecho pulse sequences of the brain and surrounding structures were obtained without intravenous contrast. Angiographic images of the head were obtained using MRA technique without contrast.  COMPARISON:  CT head without contrast 12/20/2014.  FINDINGS: MRI HEAD FINDINGS  The diffusion-weighted images demonstrate acute nonhemorrhagic 9 mm infarct within the right hippocampus. Remote lacunar infarcts are present in the basal ganglia bilaterally. Moderate generalized atrophy is present. Diffuse periventricular and subcortical T2 changes are noted bilaterally. With T2 changes are present within the thalami bilaterally. T2 changes extend into the brainstem.  The left vertebral artery is small. Flow is present in the major intracranial arteries.  A right lens replacement is  noted. The left lens is intact. The paranasal sinuses and mastoid air cells are clear.  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 Midline structures are otherwise normal.  MRA HEAD FINDINGS  Tortuosity is noted in the cervical right ICA without significant stenosis. There is minimal irregularity within the cavernous internal carotid artery on the left without a significant stenosis. The internal carotid arteries are otherwise within normal limits. The A1 and M1 segments are normal. The anterior communicating artery is patent.  The MCA bifurcations are within normal limits. The ACA and MCA branch vessels are within normal limits.  The right vertebral artery is the dominant vessel. Left vertebral artery is hypoplastic. The vertebrobasilar junction is intact. The right PICA origin  is visualized and normal. A prominent right AICA vessel is noted as well. The left PICA is unremarkable. The basilar artery is within normal limits. Both posterior cerebral arteries originate from the basilar tip. There is mild attenuation distal PCA branch vessels.  IMPRESSION: 1. Acute nonhemorrhagic infarct within the medial right temporal lobe involving the right hippocampus. 2. The MRA demonstrates mild distal small vessel disease without significant proximal stenosis, aneurysm, or branch vessel occlusion. 3. Remote lacunar infarcts are noted within the basal ganglia bilaterally. 4. Advanced periventricular and subcortical white matter disease bilaterally is consistent with microvascular ischemic change. These results will be called to the ordering clinician or representative by the Radiologist Assistant, and communication documented in the PACS or zVision Dashboard.   Electronically Signed   By: San Morelle M.D.   On: 12/21/2014 08:35   US Carotid Bilateral  12/21/2014   CLINICAL DATA:  Right temporal cerebral vascular accident  EXAM: BILATERAL CAROTID DUPLEX ULTRASOUND  TECHNIQUE: Pearline Cables scale imaging, color Doppler and duplex ultrasound were performed of bilateral carotid and vertebral arteries in the neck.  COMPARISON:  Brain MRI 12/21/2014  FINDINGS: Criteria: Quantification of carotid stenosis is based on velocity parameters that correlate the residual internal carotid diameter with NASCET-based stenosis levels, using the diameter of the distal internal carotid lumen as the denominator for stenosis measurement.  The following velocity measurements were obtained:  RIGHT  ICA:  89/27 cm/sec  CCA:  58/52 cm/sec  SYSTOLIC ICA/CCA RATIO:  1.3  DIASTOLIC ICA/CCA RATIO:  1.9  ECA:  67 cm/sec  LEFT  ICA:  79/18 cm/sec  CCA:  77/82 cm/sec  SYSTOLIC ICA/CCA RATIO:  1.1  DIASTOLIC ICA/CCA RATIO:  1.3  ECA:  62 cm/sec  RIGHT CAROTID ARTERY: No significant atherosclerotic plaque or evidence of stenosis.   RIGHT VERTEBRAL ARTERY:  Patent with normal antegrade flow.  LEFT CAROTID ARTERY: No significant atherosclerotic plaque or evidence of stenosis.  LEFT VERTEBRAL ARTERY:  Patent with normal antegrade flow.  IMPRESSION: Negative bilateral carotid duplex ultrasound.  Signed,  Criselda Peaches, MD  [facility]  [facility]   Electronically Signed   By: Jacqulynn Cadet M.D.   On: 12/21/2014 09:03     Assessment/Plan:  81 y.o. female  with a known history of hypertension, hyperuricemia, diabetes, microalbuminuria, polycythemia vera, TIA, adenocarcinoma of the cervix- lives independent life,presentes with dysarthria and L facial droop.  Pt was on ASA 81 mg daily at home.  Pt was found to have R temp infarct as well as R hyppocampal. Pt has small vessel disease.  - MRA no acute abnormality -2decho,  - hbA1c - was on ASA 81, s/p change to plavix - statin - pt/ot/speech - s/p discussion with family at bedside.  Leotis Pain  12/21/2014, 12:57 PM

## 2014-12-21 NOTE — Care Management (Signed)
Patient presents from home with weakness, slurred speech and balance concerns.  She has had elevated systolic blood pressures over 200.   She lives alone and prior to this was independent in all adls and driving.  Physical therapy has recommended skilled nursing and patient is in agreement.  Neuro has recommended slp and OT consults.  Orders placed

## 2014-12-22 LAB — GLUCOSE, CAPILLARY
Glucose-Capillary: 152 mg/dL — ABNORMAL HIGH (ref 65–99)
Glucose-Capillary: 163 mg/dL — ABNORMAL HIGH (ref 65–99)
Glucose-Capillary: 140 mg/dL — ABNORMAL HIGH (ref 65–99)
Glucose-Capillary: 144 mg/dL — ABNORMAL HIGH (ref 65–99)
Glucose-Capillary: 132 mg/dL — ABNORMAL HIGH (ref 65–99)

## 2014-12-22 MED ORDER — ONDANSETRON HCL 4 MG/2ML IJ SOLN
4.0000 mg | Freq: Four times a day (QID) | INTRAMUSCULAR | Status: DC | PRN
  Administered 2014-12-22: 4 mg via INTRAVENOUS

## 2014-12-22 MED ORDER — LABETALOL HCL 5 MG/ML IV SOLN
10.0000 mg | INTRAVENOUS | Status: DC | PRN
  Administered 2014-12-22: 10 mg via INTRAVENOUS
  Filled 2014-12-22: qty 4

## 2014-12-22 MED ORDER — ONDANSETRON HCL 4 MG/2ML IJ SOLN
INTRAMUSCULAR | Status: AC
Start: 2014-12-22 — End: 2014-12-22
  Administered 2014-12-22: 4 mg via INTRAVENOUS
  Filled 2014-12-22: qty 2

## 2014-12-22 MED ORDER — HYDRALAZINE HCL 25 MG PO TABS
25.0000 mg | ORAL_TABLET | Freq: Three times a day (TID) | ORAL | Status: DC
  Administered 2014-12-22 – 2014-12-23 (×4): 25 mg via ORAL
  Filled 2014-12-22 (×4): qty 1

## 2014-12-22 NOTE — Progress Notes (Signed)
   12/22/14 1640  Clinical Encounter Type  Visited With Patient  Visit Type Follow-up  Spiritual Encounters  Spiritual Needs Prayer  Visited with patient in her room.  Follow up visit from Thursday on call.  Pt said she is "feeling much better".  Said she hopes to be discharged to a nursing facility tomorrow for rehab.  Pt asked me to pray with her and thanked me for visiting with her.  [place] [phone]

## 2014-12-22 NOTE — Consult Note (Signed)
CC: L sided facial weakness   HPI: [name] is an 81 y.o. female  with a known history of hypertension, hyperuricemia, diabetes, microalbuminuria, polycythemia vera, TIA, adenocarcinoma of the cervix- lives independent life,presentes with dysarthria and L facial droop.  Pt was on ASA 81 mg daily at home.  Pt was found to have R temp infarct.    No acute changes since yesterday.    Past Medical History  Diagnosis Date  . Hypertension   . Hyperuricemia   . Diabetes mellitus without complication   . Polycythemia vera   . H/O TIA (transient ischemic attack) and stroke   . Adenocarcinoma in situ of cervix   . H/O compression fracture of spine 2014    thoracic spine  . Microalbuminuria   . Trochanteric bursitis   . Endometrial carcinoma     s/p total abdominal hysterectomy    Past Surgical History  Procedure Laterality Date  . Tonsillectomy    . Exploratory laparotomy      for fibroids  . Cataract extraction w/ intraocular lens implant Right   . Abdominal hysterectomy      Family History  Problem Relation Age of Onset  . Heart attack Mother   . Breast cancer Mother   . Heart attack Father   . Heart attack Sister   . Diabetes Sister     Social History:  reports that she has quit smoking. She has never used smokeless tobacco. She reports that she does not drink alcohol or use illicit drugs.  No Known Allergies  Medications: I have reviewed the patient's current medications.  ROS: History obtained from the patient  General ROS: negative for - chills, fatigue, fever, night sweats, weight gain or weight loss Psychological ROS: negative for - behavioral disorder, hallucinations, memory difficulties, mood swings or suicidal ideation Ophthalmic ROS: negative for - blurry vision, double vision, eye pain or loss of vision ENT ROS: negative for - epistaxis, nasal discharge, oral lesions, sore throat, tinnitus or vertigo Allergy and Immunology ROS: negative for - hives or  itchy/watery eyes Hematological and Lymphatic ROS: negative for - bleeding problems, bruising or swollen lymph nodes Endocrine ROS: negative for - galactorrhea, hair pattern changes, polydipsia/polyuria or temperature intolerance Respiratory ROS: negative for - cough, hemoptysis, shortness of breath or wheezing Cardiovascular ROS: negative for - chest pain, dyspnea on exertion, edema or irregular heartbeat Gastrointestinal ROS: negative for - abdominal pain, diarrhea, hematemesis, nausea/vomiting or stool incontinence Genito-Urinary ROS: negative for - dysuria, hematuria, incontinence or urinary frequency/urgency Musculoskeletal ROS: negative for - joint swelling or muscular weakness Neurological ROS: as noted in HPI Dermatological ROS: negative for rash and skin lesion changes  Physical Examination: Blood pressure 172/64, pulse 67, temperature 97.4 F (36.3 C), temperature source Oral, resp. rate 20, height 5\' 1"  (1.549 m), weight 63.504 kg (140 lb), SpO2 98 %.  HEENT-  Normocephalic, no lesions, without obvious abnormality.  Normal external eye and conjunctiva.  Normal TM's bilaterally.  Normal auditory canals and external ears. Normal external nose, mucus membranes and septum.  Normal pharynx.   Neurological Examination Mental Status: Slight dysarthria Cranial Nerves: II: Discs flat bilaterally; Visual fields grossly normal, pupils equal, round, reactive to light and accommodation III,IV, VI: ptosis not present, extra-ocular motions intact bilaterally V,VII: L facial droop.  VIII: hearing normal bilaterally IX,X: gag reflex present XI: bilateral shoulder shrug XII: midline tongue extension Motor: Right : Upper extremity   5/5    Left:     Upper extremity  5/5  Lower extremity   5/5     Lower extremity   5/5 Tone and bulk:normal tone throughout; no atrophy noted Sensory: Pinprick and light touch intact throughout, bilaterally Deep Tendon Reflexes: 1+ and symmetric  throughout Plantars: Right: downgoing   Left: downgoing Cerebellar: normal finger-to-nose, normal rapid alternating movements and normal heel-to-shin test Gait: not tested.       Laboratory Studies:   Basic Metabolic Panel:  Recent Labs Lab 12/20/14 1533 12/21/14 0513  NA 140 140  K 3.7 3.7  CL 101 103  CO2 27 28  GLUCOSE 202* 131*  BUN 19 18  CREATININE 0.95 0.88  CALCIUM 9.9 8.9  MG 1.8  --     Liver Function Tests:  Recent Labs Lab 12/20/14 1533  AST 19  ALT 15  ALKPHOS 53  BILITOT 0.4  PROT 7.6  ALBUMIN 4.6    Recent Labs Lab 12/20/14 1533  LIPASE 52*   No results for input(s): AMMONIA in the last 168 hours.  CBC:  Recent Labs Lab 12/20/14 1533 12/21/14 0513  WBC 11.3* 12.1*  NEUTROABS 8.0*  --   HGB 13.7 12.2  HCT 44.8 38.9  MCV 76.0* 75.6*  PLT 508* 449*    Cardiac Enzymes:  Recent Labs Lab 12/20/14 1533  TROPONINI <0.03    BNP: Invalid input(s): POCBNP  CBG:  Recent Labs Lab 12/21/14 1611 12/21/14 2136 12/22/14 0500 12/22/14 0727 12/22/14 1146  GLUCAP 178* 127* 152* 163* 140*    Microbiology: No results found for this or any previous visit.  Coagulation Studies:  Recent Labs  12/20/14 1533  LABPROT 14.0  INR 1.06    Urinalysis:   Recent Labs Lab 12/20/14 1639  COLORURINE STRAW*  LABSPEC 1.009  PHURINE 8.0  GLUCOSEU 50*  HGBUR NEGATIVE  BILIRUBINUR NEGATIVE  KETONESUR NEGATIVE  PROTEINUR 100*  NITRITE NEGATIVE  LEUKOCYTESUR NEGATIVE    Lipid Panel:     Component Value Date/Time   CHOL 148 12/20/2014 1533   TRIG 130 12/20/2014 1533   HDL 44 12/20/2014 1533   CHOLHDL 3.4 12/20/2014 1533   VLDL 26 12/20/2014 1533   LDLCALC 78 12/20/2014 1533    HgbA1C:  Lab Results  Component Value Date   HGBA1C 7.2* 12/20/2014    Urine Drug Screen:      Component Value Date/Time   LABOPIA NONE DETECTED 12/20/2014 1639   LABBENZ NONE DETECTED 12/20/2014 1639   AMPHETMU NONE DETECTED 12/20/2014  1639   THCU NONE DETECTED 12/20/2014 1639   LABBARB NONE DETECTED 12/20/2014 1639    Alcohol Level:   Recent Labs Lab 12/20/14 1533  ETH <5    Other results: EKG: normal EKG, normal sinus rhythm, unchanged from previous tracings.  Imaging: Ct Head Wo Contrast  12/20/2014   CLINICAL DATA:  Pt poor historian. Per nurse left sided facial and eye droop  EXAM: CT HEAD WITHOUT CONTRAST  TECHNIQUE: Contiguous axial images were obtained from the base of the skull through the vertex without intravenous contrast.  COMPARISON:  11/10/2012  FINDINGS: Ventricles normal configuration. There is ventricular and sulcal enlargement reflecting age related volume loss. No hydrocephalus.  There are no parenchymal masses or mass effect. There is no evidence of a recent cortical infarct. Patchy white matter hypoattenuation is noted consistent with moderate chronic microvascular ischemic change.  There are no extra-axial masses or abnormal fluid collections.  There is no intracranial hemorrhage.  Visualized sinuses are clear other than a single opacified right ethmoid air cell. Clear mastoid  air cells.  IMPRESSION: 1. No acute intracranial abnormalities. 2. Age related atrophy. Moderate chronic microvascular ischemic change. These results were called by telephone at the time of interpretation on 12/20/2014 at 3:59 pm to Dr. Hinda Kehr , who verbally acknowledged these results.   Electronically Signed   By: Lajean Manes M.D.   On: 12/20/2014 15:59   Mr Jodene Nam Head Wo Contrast  12/21/2014   CLINICAL DATA:  Confusion.  Dizziness.  Left-sided weakness.  EXAM: MRI HEAD WITHOUT CONTRAST  MRA HEAD WITHOUT CONTRAST  TECHNIQUE: Multiplanar, multiecho pulse sequences of the brain and surrounding structures were obtained without intravenous contrast. Angiographic images of the head were obtained using MRA technique without contrast.  COMPARISON:  CT head without contrast 12/20/2014.  FINDINGS: MRI HEAD FINDINGS  The  diffusion-weighted images demonstrate acute nonhemorrhagic 9 mm infarct within the right hippocampus. Remote lacunar infarcts are present in the basal ganglia bilaterally. Moderate generalized atrophy is present. Diffuse periventricular and subcortical T2 changes are noted bilaterally. With T2 changes are present within the thalami bilaterally. T2 changes extend into the brainstem.  The left vertebral artery is small. Flow is present in the major intracranial arteries.  A right lens replacement is noted. The left lens is intact. The paranasal sinuses and mastoid air cells are clear.  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 Midline structures are otherwise normal.  MRA HEAD FINDINGS  Tortuosity is noted in the cervical right ICA without significant stenosis. There is minimal irregularity within the cavernous internal carotid artery on the left without a significant stenosis. The internal carotid arteries are otherwise within normal limits. The A1 and M1 segments are normal. The anterior communicating artery is patent.  The MCA bifurcations are within normal limits. The ACA and MCA branch vessels are within normal limits.  The right vertebral artery is the dominant vessel. Left vertebral artery is hypoplastic. The vertebrobasilar junction is intact. The right PICA origin is visualized and normal. A prominent right AICA vessel is noted as well. The left PICA is unremarkable. The basilar artery is within normal limits. Both posterior cerebral arteries originate from the basilar tip. There is mild attenuation distal PCA branch vessels.  IMPRESSION: 1. Acute nonhemorrhagic infarct within the medial right temporal lobe involving the right hippocampus. 2. The MRA demonstrates mild distal small vessel disease without significant proximal stenosis, aneurysm, or branch vessel occlusion. 3. Remote lacunar infarcts are noted within the basal ganglia bilaterally. 4. Advanced periventricular and subcortical white matter  disease bilaterally is consistent with microvascular ischemic change. These results will be called to the ordering clinician or representative by the Radiologist Assistant, and communication documented in the PACS or zVision Dashboard.   Electronically Signed   By: San Morelle M.D.   On: 12/21/2014 08:35   Mr Brain Wo Contrast  12/21/2014   CLINICAL DATA:  Confusion.  Dizziness.  Left-sided weakness.  EXAM: MRI HEAD WITHOUT CONTRAST  MRA HEAD WITHOUT CONTRAST  TECHNIQUE: Multiplanar, multiecho pulse sequences of the brain and surrounding structures were obtained without intravenous contrast. Angiographic images of the head were obtained using MRA technique without contrast.  COMPARISON:  CT head without contrast 12/20/2014.  FINDINGS: MRI HEAD FINDINGS  The diffusion-weighted images demonstrate acute nonhemorrhagic 9 mm infarct within the right hippocampus. Remote lacunar infarcts are present in the basal ganglia bilaterally. Moderate generalized atrophy is present. Diffuse periventricular and subcortical T2 changes are noted bilaterally. With T2 changes are present within the thalami bilaterally. T2 changes extend into  the brainstem.  The left vertebral artery is small. Flow is present in the major intracranial arteries.  A right lens replacement is noted. The left lens is intact. The paranasal sinuses and mastoid air cells are clear.  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 Midline structures are otherwise normal.  MRA HEAD FINDINGS  Tortuosity is noted in the cervical right ICA without significant stenosis. There is minimal irregularity within the cavernous internal carotid artery on the left without a significant stenosis. The internal carotid arteries are otherwise within normal limits. The A1 and M1 segments are normal. The anterior communicating artery is patent.  The MCA bifurcations are within normal limits. The ACA and MCA branch vessels are within normal limits.  The right vertebral  artery is the dominant vessel. Left vertebral artery is hypoplastic. The vertebrobasilar junction is intact. The right PICA origin is visualized and normal. A prominent right AICA vessel is noted as well. The left PICA is unremarkable. The basilar artery is within normal limits. Both posterior cerebral arteries originate from the basilar tip. There is mild attenuation distal PCA branch vessels.  IMPRESSION: 1. Acute nonhemorrhagic infarct within the medial right temporal lobe involving the right hippocampus. 2. The MRA demonstrates mild distal small vessel disease without significant proximal stenosis, aneurysm, or branch vessel occlusion. 3. Remote lacunar infarcts are noted within the basal ganglia bilaterally. 4. Advanced periventricular and subcortical white matter disease bilaterally is consistent with microvascular ischemic change. These results will be called to the ordering clinician or representative by the Radiologist Assistant, and communication documented in the PACS or zVision Dashboard.   Electronically Signed   By: San Morelle M.D.   On: 12/21/2014 08:35   US Carotid Bilateral  12/21/2014   CLINICAL DATA:  Right temporal cerebral vascular accident  EXAM: BILATERAL CAROTID DUPLEX ULTRASOUND  TECHNIQUE: Pearline Cables scale imaging, color Doppler and duplex ultrasound were performed of bilateral carotid and vertebral arteries in the neck.  COMPARISON:  Brain MRI 12/21/2014  FINDINGS: Criteria: Quantification of carotid stenosis is based on velocity parameters that correlate the residual internal carotid diameter with NASCET-based stenosis levels, using the diameter of the distal internal carotid lumen as the denominator for stenosis measurement.  The following velocity measurements were obtained:  RIGHT  ICA:  89/27 cm/sec  CCA:  61/95 cm/sec  SYSTOLIC ICA/CCA RATIO:  1.3  DIASTOLIC ICA/CCA RATIO:  1.9  ECA:  67 cm/sec  LEFT  ICA:  79/18 cm/sec  CCA:  09/32 cm/sec  SYSTOLIC ICA/CCA RATIO:  1.1   DIASTOLIC ICA/CCA RATIO:  1.3  ECA:  62 cm/sec  RIGHT CAROTID ARTERY: No significant atherosclerotic plaque or evidence of stenosis.  RIGHT VERTEBRAL ARTERY:  Patent with normal antegrade flow.  LEFT CAROTID ARTERY: No significant atherosclerotic plaque or evidence of stenosis.  LEFT VERTEBRAL ARTERY:  Patent with normal antegrade flow.  IMPRESSION: Negative bilateral carotid duplex ultrasound.  Signed,  Criselda Peaches, MD  [facility]  [facility]   Electronically Signed   By: Jacqulynn Cadet M.D.   On: 12/21/2014 09:03     Assessment/Plan:  81 y.o. female  with a known history of hypertension, hyperuricemia, diabetes, microalbuminuria, polycythemia vera, TIA, adenocarcinoma of the cervix- lives independent life,presentes with dysarthria and L facial droop.  Pt was on ASA 81 mg daily at home.  Pt was found to have R temp infarct as well as R hyppocampal. Pt has small vessel disease.No acute abnormality on MRA  - Plavix,  -  d/c to rehab   12/22/2014, 3:16 PM

## 2014-12-22 NOTE — Progress Notes (Signed)
[facility] [place] NAME: [name]    MR#:  [id number]  DATE OF BIRTH:  1932-05-15  SUBJECTIVE:   Had an episode of vomiting and hot flash today with emesis. Much better now.  REVIEW OF SYSTEMS:   Review of Systems  Constitutional: Positive for malaise/fatigue. Negative for fever, chills and weight loss.  HENT: Negative for ear discharge, ear pain and nosebleeds.   Eyes: Negative for blurred vision, pain and discharge.  Respiratory: Negative for sputum production, shortness of breath, wheezing and stridor.   Cardiovascular: Negative for chest pain, palpitations, orthopnea and PND.  Gastrointestinal: Negative for nausea, vomiting, abdominal pain and diarrhea.  Genitourinary: Negative for urgency and frequency.  Musculoskeletal: Negative for back pain and joint pain.  Neurological: Positive for speech change and weakness. Negative for sensory change and focal weakness.  Psychiatric/Behavioral: Negative for depression and hallucinations. The patient is not nervous/anxious.   All other systems reviewed and are negative.  Tolerating Diet:yes Tolerating PT: rec rehab  DRUG ALLERGIES:  No Known Allergies  VITALS:  Blood pressure 190/78, pulse 61, temperature 97.4 F (36.3 C), temperature source Oral, resp. rate 20, weight 60.056 kg (132 lb 6.4 oz), SpO2 97 %.  PHYSICAL EXAMINATION:   Physical Exam  GENERAL:  81 y.o.-year-old patient lying in the bed with no acute distress.  EYES: Pupils equal, round, reactive to light and accommodation. No scleral icterus. Extraocular muscles intact.  HEENT: Head atraumatic, normocephalic. Oropharynx and nasopharynx clear.  NECK:  Supple, no jugular venous distention. No thyroid enlargement, no tenderness.  LUNGS: Normal breath sounds bilaterally, no wheezing, rales, rhonchi. No use of accessory muscles of respiration.  CARDIOVASCULAR: S1, S2 normal. No murmurs, rubs, or gallops.  ABDOMEN:  Soft, nontender, nondistended. Bowel sounds present. No organomegaly or mass.  EXTREMITIES: No cyanosis, clubbing or edema b/l.    NEUROLOGIC: Cranial nerves II through XII are intact. No focal Motor or sensory deficits b/l.  Power 4/5 all extremities PSYCHIATRIC: The patient is alert and oriented x 3.  SKIN: No obvious rash, lesion, or ulcer.    LABORATORY PANEL:   CBC  Recent Labs Lab 12/21/14 0513  WBC 12.1*  HGB 12.2  HCT 38.9  PLT 449*    Chemistries   Recent Labs Lab 12/20/14 1533 12/21/14 0513  NA 140 140  K 3.7 3.7  CL 101 103  CO2 27 28  GLUCOSE 202* 131*  BUN 19 18  CREATININE 0.95 0.88  CALCIUM 9.9 8.9  MG 1.8  --   AST 19  --   ALT 15  --   ALKPHOS 53  --   BILITOT 0.4  --     Cardiac Enzymes  Recent Labs Lab 12/20/14 1533  TROPONINI <0.03    RADIOLOGY:  Ct Head Wo Contrast  12/20/2014   CLINICAL DATA:  Pt poor historian. Per nurse left sided facial and eye droop  EXAM: CT HEAD WITHOUT CONTRAST  TECHNIQUE: Contiguous axial images were obtained from the base of the skull through the vertex without intravenous contrast.  COMPARISON:  11/10/2012  FINDINGS: Ventricles normal configuration. There is ventricular and sulcal enlargement reflecting age related volume loss. No hydrocephalus.  There are no parenchymal masses or mass effect. There is no evidence of a recent cortical infarct. Patchy white matter hypoattenuation is noted consistent with moderate chronic microvascular ischemic change.  There are no extra-axial masses or abnormal fluid collections.  There is no intracranial hemorrhage.  Visualized sinuses  are clear other than a single opacified right ethmoid air cell. Clear mastoid air cells.  IMPRESSION: 1. No acute intracranial abnormalities. 2. Age related atrophy. Moderate chronic microvascular ischemic change. These results were called by telephone at the time of interpretation on 12/20/2014 at 3:59 pm to Dr. Hinda Kehr , who verbally acknowledged  these results.   Electronically Signed   By: Lajean Manes M.D.   On: 12/20/2014 15:59   Mr Jodene Nam Head Wo Contrast  12/21/2014   CLINICAL DATA:  Confusion.  Dizziness.  Left-sided weakness.  EXAM: MRI HEAD WITHOUT CONTRAST  MRA HEAD WITHOUT CONTRAST  TECHNIQUE: Multiplanar, multiecho pulse sequences of the brain and surrounding structures were obtained without intravenous contrast. Angiographic images of the head were obtained using MRA technique without contrast.  COMPARISON:  CT head without contrast 12/20/2014.  FINDINGS: MRI HEAD FINDINGS  The diffusion-weighted images demonstrate acute nonhemorrhagic 9 mm infarct within the right hippocampus. Remote lacunar infarcts are present in the basal ganglia bilaterally. Moderate generalized atrophy is present. Diffuse periventricular and subcortical T2 changes are noted bilaterally. With T2 changes are present within the thalami bilaterally. T2 changes extend into the brainstem.  The left vertebral artery is small. Flow is present in the major intracranial arteries.  A right lens replacement is noted. The left lens is intact. The paranasal sinuses and mastoid air cells are clear.  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 Midline structures are otherwise normal.  MRA HEAD FINDINGS  Tortuosity is noted in the cervical right ICA without significant stenosis. There is minimal irregularity within the cavernous internal carotid artery on the left without a significant stenosis. The internal carotid arteries are otherwise within normal limits. The A1 and M1 segments are normal. The anterior communicating artery is patent.  The MCA bifurcations are within normal limits. The ACA and MCA branch vessels are within normal limits.  The right vertebral artery is the dominant vessel. Left vertebral artery is hypoplastic. The vertebrobasilar junction is intact. The right PICA origin is visualized and normal. A prominent right AICA vessel is noted as well. The left PICA is  unremarkable. The basilar artery is within normal limits. Both posterior cerebral arteries originate from the basilar tip. There is mild attenuation distal PCA branch vessels.  IMPRESSION: 1. Acute nonhemorrhagic infarct within the medial right temporal lobe involving the right hippocampus. 2. The MRA demonstrates mild distal small vessel disease without significant proximal stenosis, aneurysm, or branch vessel occlusion. 3. Remote lacunar infarcts are noted within the basal ganglia bilaterally. 4. Advanced periventricular and subcortical white matter disease bilaterally is consistent with microvascular ischemic change. These results will be called to the ordering clinician or representative by the Radiologist Assistant, and communication documented in the PACS or zVision Dashboard.   Electronically Signed   By: San Morelle M.D.   On: 12/21/2014 08:35   Mr Brain Wo Contrast  12/21/2014   CLINICAL DATA:  Confusion.  Dizziness.  Left-sided weakness.  EXAM: MRI HEAD WITHOUT CONTRAST  MRA HEAD WITHOUT CONTRAST  TECHNIQUE: Multiplanar, multiecho pulse sequences of the brain and surrounding structures were obtained without intravenous contrast. Angiographic images of the head were obtained using MRA technique without contrast.  COMPARISON:  CT head without contrast 12/20/2014.  FINDINGS: MRI HEAD FINDINGS  The diffusion-weighted images demonstrate acute nonhemorrhagic 9 mm infarct within the right hippocampus. Remote lacunar infarcts are present in the basal ganglia bilaterally. Moderate generalized atrophy is present. Diffuse periventricular and subcortical T2 changes are noted bilaterally.  With T2 changes are present within the thalami bilaterally. T2 changes extend into the brainstem.  The left vertebral artery is small. Flow is present in the major intracranial arteries.  A right lens replacement is noted. The left lens is intact. The paranasal sinuses and mastoid air cells are clear.  A calcified pineal  cyst measures less than 1 cm. Midline structures are otherwise normal.  MRA HEAD FINDINGS  Tortuosity is noted in the cervical right ICA without significant stenosis. There is minimal irregularity within the cavernous internal carotid artery on the left without a significant stenosis. The internal carotid arteries are otherwise within normal limits. The A1 and M1 segments are normal. The anterior communicating artery is patent.  The MCA bifurcations are within normal limits. The ACA and MCA branch vessels are within normal limits.  The right vertebral artery is the dominant vessel. Left vertebral artery is hypoplastic. The vertebrobasilar junction is intact. The right PICA origin is visualized and normal. A prominent right AICA vessel is noted as well. The left PICA is unremarkable. The basilar artery is within normal limits. Both posterior cerebral arteries originate from the basilar tip. There is mild attenuation distal PCA branch vessels.  IMPRESSION: 1. Acute nonhemorrhagic infarct within the medial right temporal lobe involving the right hippocampus. 2. The MRA demonstrates mild distal small vessel disease without significant proximal stenosis, aneurysm, or branch vessel occlusion. 3. Remote lacunar infarcts are noted within the basal ganglia bilaterally. 4. Advanced periventricular and subcortical white matter disease bilaterally is consistent with microvascular ischemic change. These results will be called to the ordering clinician or representative by the Radiologist Assistant, and communication documented in the PACS or zVision Dashboard.   Electronically Signed   By: San Morelle M.D.   On: 12/21/2014 08:35   US Carotid Bilateral  12/21/2014   CLINICAL DATA:  Right temporal cerebral vascular accident  EXAM: BILATERAL CAROTID DUPLEX ULTRASOUND  TECHNIQUE: Pearline Cables scale imaging, color Doppler and duplex ultrasound were performed of bilateral carotid and vertebral arteries in the neck.  COMPARISON:   Brain MRI 12/21/2014  FINDINGS: Criteria: Quantification of carotid stenosis is based on velocity parameters that correlate the residual internal carotid diameter with NASCET-based stenosis levels, using the diameter of the distal internal carotid lumen as the denominator for stenosis measurement.  The following velocity measurements were obtained:  RIGHT  ICA:  89/27 cm/sec  CCA:  36/64 cm/sec  SYSTOLIC ICA/CCA RATIO:  1.3  DIASTOLIC ICA/CCA RATIO:  1.9  ECA:  67 cm/sec  LEFT  ICA:  79/18 cm/sec  CCA:  40/34 cm/sec  SYSTOLIC ICA/CCA RATIO:  1.1  DIASTOLIC ICA/CCA RATIO:  1.3  ECA:  62 cm/sec  RIGHT CAROTID ARTERY: No significant atherosclerotic plaque or evidence of stenosis.  RIGHT VERTEBRAL ARTERY:  Patent with normal antegrade flow.  LEFT CAROTID ARTERY: No significant atherosclerotic plaque or evidence of stenosis.  LEFT VERTEBRAL ARTERY:  Patent with normal antegrade flow.  IMPRESSION: Negative bilateral carotid duplex ultrasound.  Signed,  Criselda Peaches, MD  [facility]  [facility]   Electronically Signed   By: Jacqulynn Cadet M.D.   On: 12/21/2014 09:03     ASSESSMENT AND PLAN:  81 y.o. female with a known history of hypertension, hyperuricemia, diabetes, microalbuminuria, polycythemia vera, TIA, adenocarcinoma of the cervix- lives independent life, today around 12 noon she suddenly started feeling very dizzy, nauseated and vomiting, and feeling very hot.she was found to have   * Acute CVA Remains in  NSR -asa  For now (pt was not on any antiplts agents at home MRI brain positive for Acute nonhemorrhagic infarct within the medial right temporal lobe involving the right hippocampus.  Consult neurology appreciated Permit blood pressure systolic to be around [phone]. -carotid doppler negative for stenosis  *Hypertension Continue carvedilol and ramipril  * Hyperlipidemia Continue pravastatin  * Diabetes Continue metformin, give insulin  sliding scale coverage.  *PT recommends rehab -will try to get her to rehab if possible over the weekend  Case discussed with Care Management/Social Worker. Management plans discussed with the patient, family and they are in agreement.  CODE STATUS: Full  DVT Prophylaxis: lovenox  TOTAL TIME TAKING CARE OF THIS PATIENT: 35 minutes.  >50% time spent on counselling and coordination of care  POSSIBLE D/C IN 1-2 DAYS, DEPENDING ON CLINICAL CONDITION.   Angelo Prindle M.D on 12/22/2014 at 9:56 AM  Between 7am to 6pm - Pager - [phone]  After 6pm go to [email or web address] - password [email or web address] [facility]  Office  [phone]  CC: Primary care physician; Madelyn Brunner, MD

## 2014-12-22 NOTE — Progress Notes (Signed)
Clinical Education officer, museum (CSW) presented bed offers to patient and her brother and sister in law were at bedside. Patient chose [facility]. Per [facility] admissions coordinator at [facility] patient is going to room 85. Plan is for patient to D/C to [place] Sunday 12/23/14. CSW will continue to follow and assist as needed.   Blima Rich, [place] [phone]

## 2014-12-22 NOTE — Progress Notes (Signed)
Speech Language Pathology Treatment: Dysphagia  Patient Details Name: ANGELYNA HENDERSON MRN: [id number] DOB: 1931-10-07 Today's Date: 12/22/2014 Time: 2761-4709 SLP Time Calculation (min) (ACUTE ONLY): 21 min  Assessment / Plan / Recommendation Clinical Impression  Pt dysarthria and slurred speech has resolved. Pt no longer demonstrates pocketing of bolus and denies having difficulty with meals. St observed with po intake and no ssx aspiration noted. St educated pt on swallow safety and aspiration risk. Pt able to return demonstrate oral motor activities and st educated ot continue independently. Pt verbally agreed. Pt no longer requires skilled speech interventions at this time.    HPI     Pertinent Vitals Pain Assessment: No/denies pain  SLP Plan       Recommendations Diet recommendations: Regular;Thin liquid Medication Administration: Whole meds with liquid Compensations: Slow rate;Small sips/bites                   GO     [place] Sauber 12/22/2014, 12:14 PM

## 2014-12-22 NOTE — Progress Notes (Signed)
Occupational Therapy Treatment Patient Details Name: [name] MRN: [id number] DOB: Dec 28, 1931 Today's Date: 12/22/2014    History of present illness 81 yo female with onset of positive findings of stroke in R temporal and hippocampal areas, with old finding in B basal ganglia areas.  PMHx:  TIA, DM, stroke, HTN, cervical CA in situ   OT comments  Patient states she had high blood pressure and wasn't feeling well early this morning, but is feeling much better now. Physician came to check on her during initial part of treatment time, and was aware of incident from this morning. Patient performed bed mobility with Modif I with HOB raised. Educated on safety with lower body dressing, but was able to complete with extra time and no AE. CGA/SBA for safety.   Follow Up Recommendations       Equipment Recommendations       Recommendations for Other Services      Precautions / Restrictions Precautions Precautions: Fall Restrictions Weight Bearing Restrictions: No       Mobility Bed Mobility Overal bed mobility: Modified Independent                Transfers                      Balance                                   ADL                                         General ADL Comments: Had been independent, now needs contact guard assist for safety.      Vision                     Perception     Praxis      Cognition   Behavior During Therapy: WFL for tasks assessed/performed Overall Cognitive Status: Within Functional Limits for tasks assessed                       Extremity/Trunk Assessment               Exercises     Shoulder Instructions       General Comments      Pertinent Vitals/ Pain       Pain Assessment: No/denies pain  Home Living                                          Prior Functioning/Environment              Frequency Min 1X/week     Progress  Toward Goals  OT Goals(current goals can now be found in the care plan section)  Progress towards OT goals: Progressing toward goals  Acute Rehab OT Goals Patient Stated Goal: to go home  Plan      Co-evaluation                 End of Session     Activity Tolerance Patient tolerated treatment well   Patient Left in bed;with call bell/phone within reach;with bed alarm set   Nurse Communication  Time: 6435-3912 OT Time Calculation (min): 18 min  Charges: OT General Charges $OT Visit: 1 Procedure OT Treatments $Self Care/Home Management : 8-22 mins  Daryl Beehler L 12/22/2014, 11:20 AM

## 2014-12-23 LAB — GLUCOSE, CAPILLARY
Glucose-Capillary: 138 mg/dL — ABNORMAL HIGH (ref 65–99)
Glucose-Capillary: 141 mg/dL — ABNORMAL HIGH (ref 65–99)

## 2014-12-23 MED ORDER — HYDRALAZINE HCL 50 MG PO TABS: 50.0000 mg | ORAL_TABLET | Freq: Three times a day (TID) | ORAL | Status: DC

## 2014-12-23 MED ORDER — CLOPIDOGREL BISULFATE 75 MG PO TABS: 75.0000 mg | ORAL_TABLET | Freq: Every day | ORAL | Status: DC

## 2014-12-23 MED ORDER — DOCUSATE SODIUM 100 MG PO CAPS: 100.0000 mg | ORAL_CAPSULE | Freq: Two times a day (BID) | ORAL | Status: DC

## 2014-12-23 NOTE — Discharge Instructions (Signed)
PT at rehab [facility]

## 2014-12-23 NOTE — Clinical Social Work Note (Signed)
csw notified RN, pt and facility that pt would DC to [facility] today via EMS.  CSW signing off

## 2014-12-23 NOTE — Discharge Summary (Signed)
[place] [facility] [place] NAME: [name]    MR#:  [id number]  DATE OF BIRTH:  1931-07-24  DATE OF ADMISSION:  12/20/2014 ADMITTING PHYSICIAN: Vaughan Basta, MD  DATE OF DISCHARGE: 12/23/2014  PRIMARY CARE PHYSICIAN: Madelyn Brunner, MD    ADMISSION DIAGNOSIS:  CVA (cerebral infarction) [I63.9] Secondary hypertension, unspecified [I15.9] Cerebral infarction due to unspecified mechanism [I63.9]  DISCHARGE DIAGNOSIS:  Acute CVA-Right temporal lobe infarct Uncontrolled HTN  SECONDARY DIAGNOSIS:   Past Medical History  Diagnosis Date  . Hypertension   . Hyperuricemia   . Diabetes mellitus without complication   . Polycythemia vera   . H/O TIA (transient ischemic attack) and stroke   . Adenocarcinoma in situ of cervix   . H/O compression fracture of spine 2014    thoracic spine  . Microalbuminuria   . Trochanteric bursitis   . Endometrial carcinoma     s/p total abdominal hysterectomy    HOSPITAL COURSE:   81 y.o. female with a known history of hypertension, hyperuricemia, diabetes, microalbuminuria, polycythemia vera, TIA, adenocarcinoma of the cervix- lives independent life, today around 12 noon she suddenly started feeling very dizzy, nauseated and vomiting, and feeling very hot.she was found to have   * Acute CVA Remains in NSR -asa For now (pt was not on any antiplts agents at home) MRI brain positive for Acute nonhemorrhagic infarct within the medial right temporal lobe involving the right hippocampus.  neurology consult ted Permit blood pressure systolic to be around 540-086. -carotid doppler negative for stenosis  *Hypertension Continue carvedilol, verapamil and ramipril -added hydralazine  * Hyperlipidemia Continue pravastatin  * Diabetes Continue metformin, give insulin sliding scale coverage.  *PT recommends rehab To [facility] today  DISCHARGE CONDITIONS:   fair CONSULTS OBTAINED:  Treatment  Team:  Leotis Pain, MD  DRUG ALLERGIES:  No Known Allergies  DISCHARGE MEDICATIONS:   Current Discharge Medication List    START taking these medications   Details  clopidogrel (PLAVIX) 75 MG tablet Take 1 tablet (75 mg total) by mouth daily. Qty: 30 tablet, Refills: 0    docusate sodium (COLACE) 100 MG capsule Take 1 capsule (100 mg total) by mouth 2 (two) times daily. Qty: 10 capsule, Refills: 0    hydrALAZINE (APRESOLINE) 50 MG tablet Take 1 tablet (50 mg total) by mouth every 8 (eight) hours. Qty: 90 tablet, Refills: 0      CONTINUE these medications which have NOT CHANGED   Details  alendronate (FOSAMAX) 70 MG tablet Take 70 mg by mouth once a week.     aspirin EC 81 MG tablet Take 81 mg by mouth daily.    calcium-vitamin D (OSCAL WITH D) 500-200 MG-UNIT per tablet Take 1 tablet by mouth daily.     carvedilol (COREG) 3.125 MG tablet Take 3.125 mg by mouth 2 (two) times daily.     fexofenadine (ALLEGRA) 180 MG tablet Take 180 mg by mouth daily.    hydroxyurea (HYDREA) 500 MG capsule Take 500 mg by mouth daily.     metFORMIN (GLUCOPHAGE) 1000 MG tablet Take 1,000 mg by mouth 2 (two) times daily.     potassium chloride (K-DUR,KLOR-CON) 10 MEQ tablet Take 10 mEq by mouth daily.    pravastatin (PRAVACHOL) 20 MG tablet Take 20 mg by mouth at bedtime.     ramipril (ALTACE) 10 MG capsule Take 10 mg by mouth 2 (two) times daily.     verapamil (CALAN-SR) 240 MG CR  tablet Take 240 mg by mouth daily.        If you experience worsening of your admission symptoms, develop shortness of breath, life threatening emergency, suicidal or homicidal thoughts you must seek medical attention immediately by calling 911 or calling your MD immediately  if symptoms less severe.  You Must read complete instructions/literature along with all the possible adverse reactions/side effects for all the Medicines you take and that have been prescribed to you. Take any new Medicines after you  have completely understood and accept all the possible adverse reactions/side effects.   Please note  You were cared for by a hospitalist during your hospital stay. If you have any questions about your discharge medications or the care you received while you were in the hospital after you are discharged, you can call the unit and asked to speak with the hospitalist on call if the hospitalist that took care of you is not available. Once you are discharged, your primary care physician will handle any further medical issues. Please note that NO REFILLS for any discharge medications will be authorized once you are discharged, as it is imperative that you return to your primary care physician (or establish a relationship with a primary care physician if you do not have one) for your aftercare needs so that they can reassess your need for medications and monitor your lab values. Today   SUBJECTIVE   My stomach is gurgling. HAd BM y'da  VITAL SIGNS:  Blood pressure 182/74, pulse 69, temperature 97.8 F (36.6 C), temperature source Oral, resp. rate 22, height 5\' 1"  (1.549 m), weight 63.504 kg (140 lb), SpO2 95 %.  I/O:   Intake/Output Summary (Last 24 hours) at 12/23/14 0753 Last data filed at 12/22/14 1833  Gross per 24 hour  Intake    240 ml  Output    550 ml  Net   -310 ml    PHYSICAL EXAMINATION:  GENERAL:  81 y.o.-year-old patient lying in the bed with no acute distress.  EYES: Pupils equal, round, reactive to light and accommodation. No scleral icterus. Extraocular muscles intact.  HEENT: Head atraumatic, normocephalic. Oropharynx and nasopharynx clear.  NECK:  Supple, no jugular venous distention. No thyroid enlargement, no tenderness.  LUNGS: Normal breath sounds bilaterally, no wheezing, rales,rhonchi or crepitation. No use of accessory muscles of respiration.  CARDIOVASCULAR: S1, S2 normal. No murmurs, rubs, or gallops.  ABDOMEN: Soft, non-tender, non-distended. Bowel sounds  present. No organomegaly or mass.  EXTREMITIES: No pedal edema, cyanosis, or clubbing.  NEUROLOGIC: Cranial nerves II through XII are intact. Muscle strength 4 in all extremities. Sensation intact. Gait not checked. No focal deficits PSYCHIATRIC: The patient is alert and oriented x 3.  SKIN: No obvious rash, lesion, or ulcer.   DATA REVIEW:   CBC   Recent Labs Lab 12/21/14 0513  WBC 12.1*  HGB 12.2  HCT 38.9  PLT 449*    Chemistries   Recent Labs Lab 12/20/14 1533 12/21/14 0513  NA 140 140  K 3.7 3.7  CL 101 103  CO2 27 28  GLUCOSE 202* 131*  BUN 19 18  CREATININE 0.95 0.88  CALCIUM 9.9 8.9  MG 1.8  --   AST 19  --   ALT 15  --   ALKPHOS 53  --   BILITOT 0.4  --     Microbiology Results   No results found for this or any previous visit (from the past 240 hour(s)).  RADIOLOGY:  Mr [facility]  Wo Contrast  12/21/2014   CLINICAL DATA:  Confusion.  Dizziness.  Left-sided weakness.  EXAM: MRI HEAD WITHOUT CONTRAST  MRA HEAD WITHOUT CONTRAST  TECHNIQUE: Multiplanar, multiecho pulse sequences of the brain and surrounding structures were obtained without intravenous contrast. Angiographic images of the head were obtained using MRA technique without contrast.  COMPARISON:  CT head without contrast 12/20/2014.  FINDINGS: MRI HEAD FINDINGS  The diffusion-weighted images demonstrate acute nonhemorrhagic 9 mm infarct within the right hippocampus. Remote lacunar infarcts are present in the basal ganglia bilaterally. Moderate generalized atrophy is present. Diffuse periventricular and subcortical T2 changes are noted bilaterally. With T2 changes are present within the thalami bilaterally. T2 changes extend into the brainstem.  The left vertebral artery is small. Flow is present in the major intracranial arteries.  A right lens replacement is noted. The left lens is intact. The paranasal sinuses and mastoid air cells are clear.  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 Midline  structures are otherwise normal.  MRA HEAD FINDINGS  Tortuosity is noted in the cervical right ICA without significant stenosis. There is minimal irregularity within the cavernous internal carotid artery on the left without a significant stenosis. The internal carotid arteries are otherwise within normal limits. The A1 and M1 segments are normal. The anterior communicating artery is patent.  The MCA bifurcations are within normal limits. The ACA and MCA branch vessels are within normal limits.  The right vertebral artery is the dominant vessel. Left vertebral artery is hypoplastic. The vertebrobasilar junction is intact. The right PICA origin is visualized and normal. A prominent right AICA vessel is noted as well. The left PICA is unremarkable. The basilar artery is within normal limits. Both posterior cerebral arteries originate from the basilar tip. There is mild attenuation distal PCA branch vessels.  IMPRESSION: 1. Acute nonhemorrhagic infarct within the medial right temporal lobe involving the right hippocampus. 2. The MRA demonstrates mild distal small vessel disease without significant proximal stenosis, aneurysm, or branch vessel occlusion. 3. Remote lacunar infarcts are noted within the basal ganglia bilaterally. 4. Advanced periventricular and subcortical white matter disease bilaterally is consistent with microvascular ischemic change. These results will be called to the ordering clinician or representative by the Radiologist Assistant, and communication documented in the PACS or zVision Dashboard.   Electronically Signed   By: San Morelle M.D.   On: 12/21/2014 08:35   Mr Brain Wo Contrast  12/21/2014   CLINICAL DATA:  Confusion.  Dizziness.  Left-sided weakness.  EXAM: MRI HEAD WITHOUT CONTRAST  MRA HEAD WITHOUT CONTRAST  TECHNIQUE: Multiplanar, multiecho pulse sequences of the brain and surrounding structures were obtained without intravenous contrast. Angiographic images of the head were  obtained using MRA technique without contrast.  COMPARISON:  CT head without contrast 12/20/2014.  FINDINGS: MRI HEAD FINDINGS  The diffusion-weighted images demonstrate acute nonhemorrhagic 9 mm infarct within the right hippocampus. Remote lacunar infarcts are present in the basal ganglia bilaterally. Moderate generalized atrophy is present. Diffuse periventricular and subcortical T2 changes are noted bilaterally. With T2 changes are present within the thalami bilaterally. T2 changes extend into the brainstem.  The left vertebral artery is small. Flow is present in the major intracranial arteries.  A right lens replacement is noted. The left lens is intact. The paranasal sinuses and mastoid air cells are clear.  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 Midline structures are otherwise normal.  MRA HEAD FINDINGS  Tortuosity is noted in the cervical right  ICA without significant stenosis. There is minimal irregularity within the cavernous internal carotid artery on the left without a significant stenosis. The internal carotid arteries are otherwise within normal limits. The A1 and M1 segments are normal. The anterior communicating artery is patent.  The MCA bifurcations are within normal limits. The ACA and MCA branch vessels are within normal limits.  The right vertebral artery is the dominant vessel. Left vertebral artery is hypoplastic. The vertebrobasilar junction is intact. The right PICA origin is visualized and normal. A prominent right AICA vessel is noted as well. The left PICA is unremarkable. The basilar artery is within normal limits. Both posterior cerebral arteries originate from the basilar tip. There is mild attenuation distal PCA branch vessels.  IMPRESSION: 1. Acute nonhemorrhagic infarct within the medial right temporal lobe involving the right hippocampus. 2. The MRA demonstrates mild distal small vessel disease without significant proximal stenosis, aneurysm, or branch vessel occlusion.  3. Remote lacunar infarcts are noted within the basal ganglia bilaterally. 4. Advanced periventricular and subcortical white matter disease bilaterally is consistent with microvascular ischemic change. These results will be called to the ordering clinician or representative by the Radiologist Assistant, and communication documented in the PACS or zVision Dashboard.   Electronically Signed   By: San Morelle M.D.   On: 12/21/2014 08:35   US Carotid Bilateral  12/21/2014   CLINICAL DATA:  Right temporal cerebral vascular accident  EXAM: BILATERAL CAROTID DUPLEX ULTRASOUND  TECHNIQUE: Pearline Cables scale imaging, color Doppler and duplex ultrasound were performed of bilateral carotid and vertebral arteries in the neck.  COMPARISON:  Brain MRI 12/21/2014  FINDINGS: Criteria: Quantification of carotid stenosis is based on velocity parameters that correlate the residual internal carotid diameter with NASCET-based stenosis levels, using the diameter of the distal internal carotid lumen as the denominator for stenosis measurement.  The following velocity measurements were obtained:  RIGHT  ICA:  89/27 cm/sec  CCA:  98/33 cm/sec  SYSTOLIC ICA/CCA RATIO:  1.3  DIASTOLIC ICA/CCA RATIO:  1.9  ECA:  67 cm/sec  LEFT  ICA:  79/18 cm/sec  CCA:  82/50 cm/sec  SYSTOLIC ICA/CCA RATIO:  1.1  DIASTOLIC ICA/CCA RATIO:  1.3  ECA:  62 cm/sec  RIGHT CAROTID ARTERY: No significant atherosclerotic plaque or evidence of stenosis.  RIGHT VERTEBRAL ARTERY:  Patent with normal antegrade flow.  LEFT CAROTID ARTERY: No significant atherosclerotic plaque or evidence of stenosis.  LEFT VERTEBRAL ARTERY:  Patent with normal antegrade flow.  IMPRESSION: Negative bilateral carotid duplex ultrasound.  Signed,  Criselda Peaches, MD  [facility]  [facility]   Electronically Signed   By: Jacqulynn Cadet M.D.   On: 12/21/2014 09:03     Management plans discussed with the patient, family and they are  in agreement.  CODE STATUS:     Code Status Orders        Start     Ordered   12/20/14 1854  Full code   Continuous     12/20/14 1853      TOTAL TIME TAKING CARE OF THIS PATIENT: 40inutes.    Maize Brittingham M.D on 12/23/2014 at 7:53 AM  Between 7am to 6pm - Pager - [phone] After 6pm go to [email or web address] - password [email or web address] [facility]  Office  [phone]  CC: Primary care physician; Madelyn Brunner, MD

## 2014-12-23 NOTE — Progress Notes (Signed)
Report called to [facility]. Pts packet completed by social work. Pt awaiting transport by EMS

## 2015-01-03 ENCOUNTER — Other Ambulatory Visit: Payer: Self-pay

## 2015-01-03 DIAGNOSIS — I63311 Cerebral infarction due to thrombosis of right middle cerebral artery: Secondary | ICD-10-CM

## 2015-01-07 ENCOUNTER — Other Ambulatory Visit: Payer: Self-pay

## 2015-01-07 ENCOUNTER — Ambulatory Visit: Payer: Self-pay | Admitting: Internal Medicine

## 2015-01-08 ENCOUNTER — Encounter: Payer: Self-pay | Admitting: Hematology and Oncology

## 2015-01-08 ENCOUNTER — Inpatient Hospital Stay: Payer: Medicare Other | Attending: Hematology and Oncology | Admitting: Hematology and Oncology

## 2015-01-08 ENCOUNTER — Inpatient Hospital Stay: Payer: Medicare Other

## 2015-01-08 VITALS — BP 129/72 | HR 78 | Temp 97.6°F | Ht 61.0 in | Wt 137.6 lb

## 2015-01-08 DIAGNOSIS — E790 Hyperuricemia without signs of inflammatory arthritis and tophaceous disease: Secondary | ICD-10-CM | POA: Diagnosis not present

## 2015-01-08 DIAGNOSIS — Z7982 Long term (current) use of aspirin: Secondary | ICD-10-CM | POA: Diagnosis not present

## 2015-01-08 DIAGNOSIS — Z8541 Personal history of malignant neoplasm of cervix uteri: Secondary | ICD-10-CM

## 2015-01-08 DIAGNOSIS — I10 Essential (primary) hypertension: Secondary | ICD-10-CM | POA: Diagnosis not present

## 2015-01-08 DIAGNOSIS — Z853 Personal history of malignant neoplasm of breast: Secondary | ICD-10-CM

## 2015-01-08 DIAGNOSIS — D509 Iron deficiency anemia, unspecified: Secondary | ICD-10-CM | POA: Diagnosis not present

## 2015-01-08 DIAGNOSIS — E119 Type 2 diabetes mellitus without complications: Secondary | ICD-10-CM | POA: Insufficient documentation

## 2015-01-08 DIAGNOSIS — K5900 Constipation, unspecified: Secondary | ICD-10-CM

## 2015-01-08 DIAGNOSIS — Z8781 Personal history of (healed) traumatic fracture: Secondary | ICD-10-CM | POA: Diagnosis not present

## 2015-01-08 DIAGNOSIS — Z79899 Other long term (current) drug therapy: Secondary | ICD-10-CM | POA: Insufficient documentation

## 2015-01-08 DIAGNOSIS — D45 Polycythemia vera: Secondary | ICD-10-CM | POA: Diagnosis not present

## 2015-01-08 DIAGNOSIS — R809 Proteinuria, unspecified: Secondary | ICD-10-CM | POA: Diagnosis not present

## 2015-01-08 DIAGNOSIS — Z803 Family history of malignant neoplasm of breast: Secondary | ICD-10-CM | POA: Diagnosis not present

## 2015-01-08 DIAGNOSIS — M7060 Trochanteric bursitis, unspecified hip: Secondary | ICD-10-CM | POA: Insufficient documentation

## 2015-01-08 DIAGNOSIS — Z8673 Personal history of transient ischemic attack (TIA), and cerebral infarction without residual deficits: Secondary | ICD-10-CM | POA: Insufficient documentation

## 2015-01-08 DIAGNOSIS — I63311 Cerebral infarction due to thrombosis of right middle cerebral artery: Secondary | ICD-10-CM

## 2015-01-08 LAB — CBC WITH DIFFERENTIAL/PLATELET
Basophils Absolute: 0.2 10*3/uL — ABNORMAL HIGH (ref 0–0.1)
Basophils Relative: 2 %
Eosinophils Absolute: 0.4 10*3/uL (ref 0–0.7)
Eosinophils Relative: 3 %
HCT: 40 % (ref 35.0–47.0)
Hemoglobin: 12.5 g/dL (ref 12.0–16.0)
Lymphocytes Relative: 10 %
Lymphs Abs: 1.1 10*3/uL (ref 1.0–3.6)
MCH: 23.9 pg — ABNORMAL LOW (ref 26.0–34.0)
MCHC: 31.2 g/dL — ABNORMAL LOW (ref 32.0–36.0)
MCV: 76.5 fL — ABNORMAL LOW (ref 80.0–100.0)
Monocytes Absolute: 0.8 10*3/uL (ref 0.2–0.9)
Monocytes Relative: 7 %
Neutro Abs: 8.5 10*3/uL — ABNORMAL HIGH (ref 1.4–6.5)
Neutrophils Relative %: 78 %
Platelets: 446 10*3/uL — ABNORMAL HIGH (ref 150–440)
RBC: 5.23 MIL/uL — ABNORMAL HIGH (ref 3.80–5.20)
RDW: 18.7 % — ABNORMAL HIGH (ref 11.5–14.5)
WBC: 10.9 10*3/uL (ref 3.6–11.0)

## 2015-01-08 NOTE — Progress Notes (Signed)
Pt here today for follow up regarding IDA; was hospitalized end of June-July 3rd for stroke; went to rehab but home now; offers no complaint today

## 2015-01-08 NOTE — Progress Notes (Signed)
[facility] day:  01/08/2015  Chief Complaint: [name] is a 81 y.o. female with polycythemia rubra vera (PV) who is seen for reassessment.  HPI: The patient states that she was diagnosed with polycythemia rubra vera in 2008. She describes having "too many platelets". Testing confirmed a JAK2 mutation. Erythropoietin level was low. She denies any prior bone marrow. She was started on a phlebotomy program. Target hematocrit is 40-42.5. She has had 2-3 low volume phlebotomies. Screening for von Willebrand and a platelet function was normal. She has been on a baby aspirin. She's been on Plavix since her TIA.  She is on hydroxyurea 1 pill Monday, Wednesday, and Friday with the following week Monday, Wednesday, Friday, and Saturday.  Review of available labs reveal a platelet count of 476,000 on 10/15/2014, 428,000 on 11/26/2014, 508,000 on 12/20/2014 and 449,000 on 12/21/2014.  He has a history of iron deficiency anemia status post GI evaluation. Colonoscopy in 07/2007 by Dr. Candace [name] was negative. For persistent recurrent iron deficiency anemia, she underwent EGD and colonoscopy in 12/2011 and 09/11/2013.  The patient has a history of adenocarcinoma in situ of the cervix. She describes presenting with bleeding. She underwent hysterectomy.  Symptomatically she notes that since her CVA she's had no "get up and go".  Past Medical History  Diagnosis Date  . Hypertension   . Hyperuricemia   . Diabetes mellitus without complication   . Polycythemia vera   . H/O TIA (transient ischemic attack) and stroke   . Adenocarcinoma in situ of cervix   . H/O compression fracture of spine 2014    thoracic spine  . Microalbuminuria   . Trochanteric bursitis   . Endometrial carcinoma     s/p total abdominal hysterectomy  . Breast cancer   . Stroke     Past Surgical History  Procedure Laterality Date  . Tonsillectomy    . Exploratory laparotomy      for  fibroids  . Cataract extraction w/ intraocular lens implant Right   . Abdominal hysterectomy      Family History  Problem Relation Age of Onset  . Heart attack Mother   . Breast cancer Mother   . Heart attack Father   . Heart attack Sister   . Diabetes Sister     Social History:  reports that she has quit smoking. She has never used smokeless tobacco. She reports that she does not drink alcohol or use illicit drugs.  The patient is alone today.  Allergies:  Allergies  Allergen Reactions  . Simvastatin     Other reaction(s): Muscle Pain    Current Medications: Current Outpatient Prescriptions  Medication Sig Dispense Refill  . alendronate (FOSAMAX) 70 MG tablet Take 70 mg by mouth once a week.     Marland Kitchen aspirin EC 81 MG tablet Take 81 mg by mouth daily.    . calcium-vitamin D (OSCAL WITH D) 500-200 MG-UNIT per tablet Take 1 tablet by mouth daily.     . carvedilol (COREG) 3.125 MG tablet Take 3.125 mg by mouth 2 (two) times daily.     . clopidogrel (PLAVIX) 75 MG tablet Take 1 tablet (75 mg total) by mouth daily. 30 tablet 0  . docusate sodium (COLACE) 100 MG capsule Take 1 capsule (100 mg total) by mouth 2 (two) times daily. 10 capsule 0  . fexofenadine (ALLEGRA) 180 MG tablet Take 180 mg by mouth daily.    . hydrALAZINE (APRESOLINE) 50 MG tablet  Take 1 tablet (50 mg total) by mouth every 8 (eight) hours. 90 tablet 0  . hydroxyurea (HYDREA) 500 MG capsule Take 500 mg by mouth daily.     . metFORMIN (GLUCOPHAGE) 1000 MG tablet Take 1,000 mg by mouth 2 (two) times daily.     . potassium chloride (K-DUR,KLOR-CON) 10 MEQ tablet Take 10 mEq by mouth daily.    . pravastatin (PRAVACHOL) 20 MG tablet Take 20 mg by mouth at bedtime.     . ramipril (ALTACE) 10 MG capsule Take 10 mg by mouth 2 (two) times daily.     . verapamil (CALAN-SR) 240 MG CR tablet Take 240 mg by mouth daily.     No current facility-administered medications for this visit.    Review of Systems:  GENERAL:  No "get  up and go".  No fevers, sweats or weight loss. PERFORMANCE STATUS (ECOG):  1 HEENT:  No visual changes, runny nose, sore throat, mouth sores or tenderness. Lungs: No shortness of breath or cough.  No hemoptysis. Cardiac:  No chest pain, palpitations, orthopnea, or PND. GI:  Constipation.  No nausea, vomiting, diarrhea, melena or hematochezia. GU:  No urgency, frequency, dysuria, or hematuria. Musculoskeletal:  No back pain.  No joint pain.  No muscle tenderness. Extremities:  No pain or swelling. Skin:  No rashes or skin changes. Neuro:  No headache, numbness or weakness, balance or coordination issues. Endocrine:  No diabetes, thyroid issues, hot flashes or night sweats. Psych:  No mood changes, depression or anxiety. Pain:  No focal pain. Review of systems:  All other systems reviewed and found to be negative.   Physical Exam: Blood pressure 129/72, pulse 78, temperature 97.6 F (36.4 C), temperature source Tympanic, height 5\' 1"  (1.549 m), weight 137 lb 9.1 oz (62.4 kg). GENERAL:  Well developed, well nourished, sitting comfortably in the exam room in no acute distress. MENTAL STATUS:  Alert and oriented to person, place and time. HEAD:  Short styled hair.  Normocephalic, atraumatic, face symmetric, no Cushingoid features. EYES:  Glasses.  Blue eyes.  Pupils equal round and reactive to light and accomodation.  No conjunctivitis or scleral icterus. ENT:  Oropharynx clear without lesion.  Tongue normal. Mucous membranes moist.  RESPIRATORY:  Clear to auscultation without rales, wheezes or rhonchi. CARDIOVASCULAR:  Regular rate and rhythm without murmur, rub or gallop. ABDOMEN:  Soft, non-tender, with active bowel sounds, and no hepatosplenomegaly.  No masses. SKIN:  No rashes, ulcers or lesions. EXTREMITIES: No edema, no skin discoloration or tenderness.  No palpable cords. LYMPH NODES: No palpable cervical, supraclavicular, axillary or inguinal adenopathy  NEUROLOGICAL:  Unremarkable. PSYCH:  Appropriate.   Appointment on 01/08/2015  Component Date Value Ref Range Status  . WBC 01/08/2015 10.9  3.6 - 11.0 K/uL Final  . RBC 01/08/2015 5.23* 3.80 - 5.20 MIL/uL Final  . Hemoglobin 01/08/2015 12.5  12.0 - 16.0 g/dL Final  . HCT 01/08/2015 40.0  35.0 - 47.0 % Final  . MCV 01/08/2015 76.5* 80.0 - 100.0 fL Final  . MCH 01/08/2015 23.9* 26.0 - 34.0 pg Final  . MCHC 01/08/2015 31.2* 32.0 - 36.0 g/dL Final  . RDW 01/08/2015 18.7* 11.5 - 14.5 % Final  . Platelets 01/08/2015 446* 150 - 440 K/uL Final  . Neutrophils Relative % 01/08/2015 78   Final  . Neutro Abs 01/08/2015 8.5* 1.4 - 6.5 K/uL Final  . Lymphocytes Relative 01/08/2015 10   Final  . Lymphs Abs 01/08/2015 1.1  1.0 - 3.6 K/uL  Final  . Monocytes Relative 01/08/2015 7   Final  . Monocytes Absolute 01/08/2015 0.8  0.2 - 0.9 K/uL Final  . Eosinophils Relative 01/08/2015 3   Final  . Eosinophils Absolute 01/08/2015 0.4  0 - 0.7 K/uL Final  . Basophils Relative 01/08/2015 2   Final  . Basophils Absolute 01/08/2015 0.2* 0 - 0.1 K/uL Final    Assessment:  [name] is a 81 y.o. female with JAK2+ polycythemia rubra vera (PV) diagnosed in 2008.  She has undergone low phlebotomies x 2-3 to maintain a target hematocrit is 40-42.5. She is on hydroxyurea 1 pill Monday, Wednesday, and Friday alternating with the following week on Monday, Wednesday, Friday, and Saturday.  Screening for von Willebrand and a platelet function was normal. She is on a baby aspirin. She has been on Plavix since her TIA/CVA.    She has a history of iron deficiency anemia status post GI evaluation. Colonoscopy in 07/2007 was negative. For persistent recurrent iron deficiency anemia, she underwent EGD and colonoscopy in 12/2011 and 09/11/2013.  She has a history of adenocarcinoma in situ of the cervix s/p hysterectomy.  Symptomatically, she fatigue since her CVA.  Exam is unremarkable.  Plan: 1. Review entire medical history, diagnosis  and management of polycythemia rubra vera. 2. Labs today:  CBC with diff. 3. RTC in 6 weeks for CBC with diff. 4. RTC in 3 months for MD assess and labs (CBC with diff, CMP)   Lequita Asal, MD  01/08/2015, 10:37 AM

## 2015-02-19 ENCOUNTER — Inpatient Hospital Stay: Payer: Medicare Other | Attending: Hematology and Oncology

## 2015-03-14 ENCOUNTER — Other Ambulatory Visit: Payer: Self-pay

## 2015-03-14 ENCOUNTER — Ambulatory Visit: Payer: Self-pay

## 2015-03-22 ENCOUNTER — Other Ambulatory Visit: Payer: Self-pay | Admitting: Internal Medicine

## 2015-03-22 ENCOUNTER — Ambulatory Visit
Admission: RE | Admit: 2015-03-22 | Discharge: 2015-03-22 | Disposition: A | Discharge status: Home / Self Care | Payer: Medicare Other | Source: Ambulatory Visit | Attending: Internal Medicine | Admitting: Internal Medicine

## 2015-03-22 DIAGNOSIS — Z8541 Personal history of malignant neoplasm of cervix uteri: Secondary | ICD-10-CM | POA: Insufficient documentation

## 2015-03-22 DIAGNOSIS — R1032 Left lower quadrant pain: Secondary | ICD-10-CM | POA: Insufficient documentation

## 2015-03-22 DIAGNOSIS — R188 Other ascites: Secondary | ICD-10-CM | POA: Diagnosis not present

## 2015-03-22 DIAGNOSIS — I2510 Atherosclerotic heart disease of native coronary artery without angina pectoris: Secondary | ICD-10-CM | POA: Diagnosis not present

## 2015-03-22 DIAGNOSIS — I517 Cardiomegaly: Secondary | ICD-10-CM | POA: Diagnosis not present

## 2015-03-22 MED ORDER — IOHEXOL 300 MG/ML  SOLN
100.0000 mL | Freq: Once | INTRAMUSCULAR | Status: AC | PRN
  Administered 2015-03-22: 100 mL via INTRAVENOUS

## 2015-03-27 ENCOUNTER — Encounter
Admission: RE | Admit: 2015-03-27 | Discharge: 2015-03-27 | Disposition: A | Discharge status: Home / Self Care | Payer: Medicare Other | Source: Ambulatory Visit | Attending: Surgery | Admitting: Surgery

## 2015-03-27 HISTORY — DX: Unspecified malignant neoplasm of skin, unspecified: C44.90

## 2015-03-27 HISTORY — DX: Personal history of neoplasm of uncertain behavior: Z86.03

## 2015-03-27 HISTORY — DX: Personal history of diseases of the blood and blood-forming organs and certain disorders involving the immune mechanism: Z86.2

## 2015-03-27 HISTORY — DX: Repeated falls: R29.6

## 2015-03-27 LAB — SURGICAL PCR SCREEN
MRSA, PCR: NEGATIVE
Staphylococcus aureus: NEGATIVE

## 2015-03-27 NOTE — Patient Instructions (Signed)
  Your procedure is scheduled on: Thursday 03/28/15 Report to Day Surgery. 2ND FLOOR MEDICAL MALL ENTRANCE To find out your arrival time please call [phone] between 1PM - 3PM on TODAY.  Remember: Instructions that are not followed completely may result in serious medical risk, up to and including death, or upon the discretion of your surgeon and anesthesiologist your surgery may need to be rescheduled.    __X__ 1. Do not eat food or drink liquids after midnight. No gum chewing or hard candies.     __X__ 2. No Alcohol for 24 hours before or after surgery.   ____ 3. Bring all medications with you on the day of surgery if instructed.    __X__ 4. Notify your doctor if there is any change in your medical condition     (cold, fever, infections).     Do not wear jewelry, make-up, hairpins, clips or nail polish.  Do not wear lotions, powders, or perfumes.   Do not shave 48 hours prior to surgery. Men may shave face and neck.  Do not bring valuables to the hospital.    [facility] is not responsible for any belongings or valuables.               Contacts, dentures or bridgework may not be worn into surgery.  Leave your suitcase in the car. After surgery it may be brought to your room.  For patients admitted to the hospital, discharge time is determined by your                treatment team.   Patients discharged the day of surgery will not be allowed to drive home.   Please read over the following fact sheets that you were given:   MRSA Information and Surgical Site Infection Prevention   __X__ Take these medicines the morning of surgery with A SIP OF WATER:    1. CARVEDILOL  2. HYDRALAZINE  3. RAMIPRIL  4. VERAPAMIL  5.  6.  ____ Fleet Enema (as directed)   __X__ Use CHG Soap as directed  ____ Use inhalers on the day of surgery  __X__ Stop metformin 2 days prior to surgery    ____ Take 1/2 of usual insulin dose the night before surgery and none on the morning of surgery.    __X__ Stop Coumadin/Plavix/aspirin on AS DIRECTED  ____ Stop Anti-inflammatories on    ____ Stop supplements until after surgery.    ____ Bring C-Pap to the hospital.

## 2015-03-28 ENCOUNTER — Inpatient Hospital Stay: Payer: Medicare Other | Admitting: Anesthesiology

## 2015-03-28 ENCOUNTER — Encounter: Payer: Self-pay | Admitting: *Deleted

## 2015-03-28 ENCOUNTER — Encounter
Admission: RE | Disposition: A | Discharge status: Home Health | Payer: Self-pay | Source: Ambulatory Visit | Attending: Surgery

## 2015-03-28 ENCOUNTER — Inpatient Hospital Stay
Admission: RE | Admit: 2015-03-28 | Discharge: 2015-04-03 | DRG: 329 | Disposition: A | Discharge status: Home Health | Payer: Medicare Other | Source: Ambulatory Visit | Attending: Surgery | Admitting: Surgery

## 2015-03-28 DIAGNOSIS — C180 Malignant neoplasm of cecum: Secondary | ICD-10-CM | POA: Diagnosis present

## 2015-03-28 DIAGNOSIS — R188 Other ascites: Secondary | ICD-10-CM | POA: Diagnosis present

## 2015-03-28 DIAGNOSIS — R0789 Other chest pain: Secondary | ICD-10-CM

## 2015-03-28 DIAGNOSIS — K922 Gastrointestinal hemorrhage, unspecified: Secondary | ICD-10-CM | POA: Diagnosis present

## 2015-03-28 DIAGNOSIS — Z7982 Long term (current) use of aspirin: Secondary | ICD-10-CM

## 2015-03-28 DIAGNOSIS — K56609 Unspecified intestinal obstruction, unspecified as to partial versus complete obstruction: Secondary | ICD-10-CM

## 2015-03-28 DIAGNOSIS — E1165 Type 2 diabetes mellitus with hyperglycemia: Secondary | ICD-10-CM | POA: Diagnosis present

## 2015-03-28 DIAGNOSIS — Z7902 Long term (current) use of antithrombotics/antiplatelets: Secondary | ICD-10-CM

## 2015-03-28 DIAGNOSIS — I6330 Cerebral infarction due to thrombosis of unspecified cerebral artery: Secondary | ICD-10-CM | POA: Insufficient documentation

## 2015-03-28 DIAGNOSIS — Z794 Long term (current) use of insulin: Secondary | ICD-10-CM | POA: Diagnosis not present

## 2015-03-28 DIAGNOSIS — D72829 Elevated white blood cell count, unspecified: Secondary | ICD-10-CM | POA: Diagnosis present

## 2015-03-28 DIAGNOSIS — Z79899 Other long term (current) drug therapy: Secondary | ICD-10-CM

## 2015-03-28 DIAGNOSIS — K5660 Unspecified intestinal obstruction: Secondary | ICD-10-CM | POA: Diagnosis present

## 2015-03-28 DIAGNOSIS — E119 Type 2 diabetes mellitus without complications: Secondary | ICD-10-CM | POA: Diagnosis present

## 2015-03-28 DIAGNOSIS — Z888 Allergy status to other drugs, medicaments and biological substances status: Secondary | ICD-10-CM | POA: Diagnosis not present

## 2015-03-28 DIAGNOSIS — I10 Essential (primary) hypertension: Secondary | ICD-10-CM | POA: Diagnosis present

## 2015-03-28 DIAGNOSIS — R58 Hemorrhage, not elsewhere classified: Secondary | ICD-10-CM

## 2015-03-28 DIAGNOSIS — I639 Cerebral infarction, unspecified: Secondary | ICD-10-CM

## 2015-03-28 DIAGNOSIS — R079 Chest pain, unspecified: Secondary | ICD-10-CM

## 2015-03-28 DIAGNOSIS — C182 Malignant neoplasm of ascending colon: Secondary | ICD-10-CM | POA: Diagnosis present

## 2015-03-28 DIAGNOSIS — Z7984 Long term (current) use of oral hypoglycemic drugs: Secondary | ICD-10-CM

## 2015-03-28 DIAGNOSIS — G40909 Epilepsy, unspecified, not intractable, without status epilepticus: Secondary | ICD-10-CM | POA: Diagnosis present

## 2015-03-28 DIAGNOSIS — G459 Transient cerebral ischemic attack, unspecified: Secondary | ICD-10-CM

## 2015-03-28 HISTORY — PX: BOWEL RESECTION: SHX1257

## 2015-03-28 LAB — GLUCOSE, CAPILLARY
Glucose-Capillary: 148 mg/dL — ABNORMAL HIGH (ref 65–99)
Glucose-Capillary: 169 mg/dL — ABNORMAL HIGH (ref 65–99)

## 2015-03-28 SURGERY — EXCISION, SMALL INTESTINE
Anesthesia: General | Wound class: Clean Contaminated

## 2015-03-28 MED ORDER — ONDANSETRON HCL 4 MG/2ML IJ SOLN
4.0000 mg | Freq: Four times a day (QID) | INTRAMUSCULAR | Status: DC | PRN
  Administered 2015-03-28 – 2015-03-30 (×4): 4 mg via INTRAVENOUS
  Filled 2015-03-28 (×3): qty 2

## 2015-03-28 MED ORDER — FAMOTIDINE 20 MG PO TABS
20.0000 mg | ORAL_TABLET | Freq: Once | ORAL | Status: AC
  Administered 2015-03-28: 20 mg via ORAL

## 2015-03-28 MED ORDER — HYDROCODONE-ACETAMINOPHEN 5-325 MG PO TABS
1.0000 | ORAL_TABLET | ORAL | Status: DC | PRN
  Administered 2015-03-28 – 2015-03-29 (×2): 1 via ORAL
  Filled 2015-03-28 (×2): qty 1

## 2015-03-28 MED ORDER — HYDRALAZINE HCL 25 MG PO TABS
25.0000 mg | ORAL_TABLET | Freq: Two times a day (BID) | ORAL | Status: DC
  Administered 2015-03-28 – 2015-03-29 (×3): 25 mg via ORAL
  Filled 2015-03-28 (×3): qty 1

## 2015-03-28 MED ORDER — VERAPAMIL HCL ER 240 MG PO TBCR
240.0000 mg | EXTENDED_RELEASE_TABLET | Freq: Every day | ORAL | Status: DC
  Administered 2015-03-28 – 2015-03-30 (×3): 240 mg via ORAL
  Filled 2015-03-28 (×4): qty 1

## 2015-03-28 MED ORDER — KCL IN DEXTROSE-NACL 20-5-0.2 MEQ/L-%-% IV SOLN
INTRAVENOUS | Status: DC
  Administered 2015-03-28 – 2015-03-30 (×4): via INTRAVENOUS
  Filled 2015-03-28 (×5): qty 1000

## 2015-03-28 MED ORDER — ACETAMINOPHEN 650 MG RE SUPP: 650.0000 mg | Freq: Four times a day (QID) | RECTAL | Status: DC | PRN

## 2015-03-28 MED ORDER — DEXTROSE 5 % IV SOLN
2.0000 g | Freq: Once | INTRAVENOUS | Status: AC
  Administered 2015-03-28: 2 g via INTRAVENOUS
  Filled 2015-03-28: qty 2

## 2015-03-28 MED ORDER — DOCUSATE SODIUM 100 MG PO CAPS
250.0000 mg | ORAL_CAPSULE | Freq: Every day | ORAL | Status: DC
  Filled 2015-03-28: qty 1

## 2015-03-28 MED ORDER — PHENYLEPHRINE HCL 10 MG/ML IJ SOLN
INTRAMUSCULAR | Status: DC | PRN
  Administered 2015-03-28: 50 ug via INTRAVENOUS
  Administered 2015-03-28: 100 ug via INTRAVENOUS

## 2015-03-28 MED ORDER — ONDANSETRON 4 MG PO TBDP: 4.0000 mg | ORAL_TABLET | Freq: Four times a day (QID) | ORAL | Status: DC | PRN

## 2015-03-28 MED ORDER — LABETALOL HCL 5 MG/ML IV SOLN
5.0000 mg | INTRAVENOUS | Status: AC | PRN
  Administered 2015-03-28 (×4): 5 mg via INTRAVENOUS

## 2015-03-28 MED ORDER — SODIUM CHLORIDE 0.9 % IR SOLN
Status: DC | PRN
  Administered 2015-03-28: 1000 mL

## 2015-03-28 MED ORDER — LORATADINE 10 MG PO TABS
10.0000 mg | ORAL_TABLET | Freq: Every day | ORAL | Status: DC
  Administered 2015-03-28 – 2015-04-03 (×6): 10 mg via ORAL
  Filled 2015-03-28 (×6): qty 1

## 2015-03-28 MED ORDER — CARVEDILOL 6.25 MG PO TABS
6.2500 mg | ORAL_TABLET | Freq: Two times a day (BID) | ORAL | Status: DC
  Administered 2015-03-29 – 2015-04-03 (×11): 6.25 mg via ORAL
  Filled 2015-03-28 (×12): qty 1

## 2015-03-28 MED ORDER — PROPOFOL 10 MG/ML IV BOLUS
INTRAVENOUS | Status: DC | PRN
  Administered 2015-03-28: 120 mg via INTRAVENOUS

## 2015-03-28 MED ORDER — FAMOTIDINE 20 MG PO TABS
ORAL_TABLET | ORAL | Status: AC
  Administered 2015-03-28: 20 mg via ORAL
  Filled 2015-03-28: qty 1

## 2015-03-28 MED ORDER — LACTATED RINGERS IV SOLN
INTRAVENOUS | Status: DC | PRN
  Administered 2015-03-28: 16:00:00 via INTRAVENOUS

## 2015-03-28 MED ORDER — MORPHINE SULFATE (PF) 2 MG/ML IV SOLN
1.0000 mg | INTRAVENOUS | Status: DC | PRN
  Administered 2015-03-29: 1 mg via INTRAVENOUS
  Filled 2015-03-28: qty 1

## 2015-03-28 MED ORDER — RAMIPRIL 10 MG PO CAPS
10.0000 mg | ORAL_CAPSULE | Freq: Two times a day (BID) | ORAL | Status: DC
  Administered 2015-03-28 – 2015-03-29 (×3): 10 mg via ORAL
  Filled 2015-03-28 (×6): qty 1

## 2015-03-28 MED ORDER — SODIUM CHLORIDE 0.9 % IV SOLN
INTRAVENOUS | Status: DC
  Administered 2015-03-28: 12:00:00 via INTRAVENOUS

## 2015-03-28 MED ORDER — FENTANYL CITRATE (PF) 100 MCG/2ML IJ SOLN
25.0000 ug | INTRAMUSCULAR | Status: DC | PRN
  Administered 2015-03-28: 25 ug via INTRAVENOUS

## 2015-03-28 MED ORDER — ONDANSETRON HCL 4 MG/2ML IJ SOLN
INTRAMUSCULAR | Status: AC
  Filled 2015-03-28: qty 2

## 2015-03-28 MED ORDER — ONDANSETRON HCL 4 MG/2ML IJ SOLN
INTRAMUSCULAR | Status: DC | PRN
  Administered 2015-03-28: 4 mg via INTRAVENOUS

## 2015-03-28 MED ORDER — ACETAMINOPHEN 325 MG PO TABS: 650.0000 mg | ORAL_TABLET | ORAL | Status: DC | PRN

## 2015-03-28 MED ORDER — SUGAMMADEX SODIUM 200 MG/2ML IV SOLN
INTRAVENOUS | Status: DC | PRN
  Administered 2015-03-28: 120 mg via INTRAVENOUS

## 2015-03-28 MED ORDER — DOCUSATE SODIUM 100 MG PO CAPS
200.0000 mg | ORAL_CAPSULE | Freq: Every day | ORAL | Status: DC
Start: 2015-03-28 — End: 2015-04-01
  Administered 2015-03-28 – 2015-03-31 (×4): 200 mg via ORAL
  Filled 2015-03-28 (×4): qty 2
  Filled 2015-03-28: qty 1

## 2015-03-28 MED ORDER — ONDANSETRON HCL 4 MG/2ML IJ SOLN: 4.0000 mg | Freq: Once | INTRAMUSCULAR | Status: DC | PRN

## 2015-03-28 MED ORDER — GLYCOPYRROLATE 0.2 MG/ML IJ SOLN
INTRAMUSCULAR | Status: DC | PRN
  Administered 2015-03-28: 0.2 mg via INTRAVENOUS

## 2015-03-28 MED ORDER — LIDOCAINE HCL (CARDIAC) 20 MG/ML IV SOLN
INTRAVENOUS | Status: DC | PRN
  Administered 2015-03-28: 40 mg via INTRAVENOUS

## 2015-03-28 MED ORDER — FENTANYL CITRATE (PF) 100 MCG/2ML IJ SOLN
INTRAMUSCULAR | Status: DC | PRN
  Administered 2015-03-28 (×4): 50 ug via INTRAVENOUS

## 2015-03-28 MED ORDER — SUCCINYLCHOLINE CHLORIDE 20 MG/ML IJ SOLN
INTRAMUSCULAR | Status: DC | PRN
  Administered 2015-03-28: 80 mg via INTRAVENOUS

## 2015-03-28 MED ORDER — ENOXAPARIN SODIUM 30 MG/0.3ML ~~LOC~~ SOLN
30.0000 mg | SUBCUTANEOUS | Status: DC
  Administered 2015-03-29 – 2015-03-30 (×2): 30 mg via SUBCUTANEOUS
  Filled 2015-03-28 (×2): qty 0.3

## 2015-03-28 MED ORDER — HYDROXYUREA 500 MG PO CAPS
500.0000 mg | ORAL_CAPSULE | Freq: Every day | ORAL | Status: DC
  Administered 2015-03-28 – 2015-04-03 (×7): 500 mg via ORAL
  Filled 2015-03-28 (×9): qty 1

## 2015-03-28 MED ORDER — FENTANYL CITRATE (PF) 100 MCG/2ML IJ SOLN
INTRAMUSCULAR | Status: AC
  Filled 2015-03-28: qty 2

## 2015-03-28 MED ORDER — ROCURONIUM BROMIDE 100 MG/10ML IV SOLN
INTRAVENOUS | Status: DC | PRN
  Administered 2015-03-28 (×2): 10 mg via INTRAVENOUS
  Administered 2015-03-28: 20 mg via INTRAVENOUS
  Administered 2015-03-28: 10 mg via INTRAVENOUS

## 2015-03-28 MED ORDER — PRAVASTATIN SODIUM 20 MG PO TABS
20.0000 mg | ORAL_TABLET | Freq: Every day | ORAL | Status: DC
  Administered 2015-03-28 – 2015-04-02 (×6): 20 mg via ORAL
  Filled 2015-03-28 (×6): qty 1

## 2015-03-28 MED ORDER — LABETALOL HCL 5 MG/ML IV SOLN
INTRAVENOUS | Status: AC
  Filled 2015-03-28: qty 4

## 2015-03-28 SURGICAL SUPPLY — 48 items
CANISTER SUCT 1200ML W/VALVE (MISCELLANEOUS) ×3 IMPLANT
CATH TRAY 16F METER LATEX (MISCELLANEOUS) IMPLANT
CHLORAPREP W/TINT 26ML (MISCELLANEOUS) ×3 IMPLANT
CLIP TI LARGE 6 (CLIP) IMPLANT
CLIP TI MEDIUM 6 (CLIP) IMPLANT
CLOSURE WOUND 1/2 X4 (GAUZE/BANDAGES/DRESSINGS) ×1
COVER CLAMP SIL LG PBX B (MISCELLANEOUS) IMPLANT
DRAIN PENROSE 5/8X12 LTX STRL (DRAIN) IMPLANT
DRAPE LAPAROTOMY 100X77 ABD (DRAPES) ×3 IMPLANT
DRAPE LEGGINS SURG 28X43 STRL (DRAPES) IMPLANT
DRAPE UNDER BUTTOCK W/FLU (DRAPES) IMPLANT
DRSG OPSITE POSTOP 4X10 (GAUZE/BANDAGES/DRESSINGS) ×3 IMPLANT
DRSG OPSITE POSTOP 4X8 (GAUZE/BANDAGES/DRESSINGS) ×3 IMPLANT
GAUZE SPONGE 4X4 12PLY STRL (GAUZE/BANDAGES/DRESSINGS) IMPLANT
GLOVE BIO SURGEON STRL SZ7.5 (GLOVE) ×30 IMPLANT
GOWN STRL REUS W/ TWL LRG LVL3 (GOWN DISPOSABLE) ×6 IMPLANT
GOWN STRL REUS W/TWL LRG LVL3 (GOWN DISPOSABLE) ×12
HANDLE SUCTION POOLE (INSTRUMENTS) ×1 IMPLANT
HANDLE YANKAUER SUCT BULB TIP (MISCELLANEOUS) ×3 IMPLANT
LABEL OR SOLS (LABEL) ×3 IMPLANT
NS IRRIG 1000ML POUR BTL (IV SOLUTION) ×3 IMPLANT
PACK BASIN MAJOR ARMC (MISCELLANEOUS) ×3 IMPLANT
PACK COLON CLEAN CLOSURE (MISCELLANEOUS) ×3 IMPLANT
PAD GROUND ADULT SPLIT (MISCELLANEOUS) ×3 IMPLANT
PAD PREP 24X41 OB/GYN DISP (PERSONAL CARE ITEMS) IMPLANT
SHEARS HARMONIC STRL 23CM (MISCELLANEOUS) ×3 IMPLANT
SOL PREP PVP 2OZ (MISCELLANEOUS)
SOLUTION PREP PVP 2OZ (MISCELLANEOUS) IMPLANT
SPONGE LAP 18X18 5 PK (GAUZE/BANDAGES/DRESSINGS) ×3 IMPLANT
STAPLER GUN LINEAR PROX 60 (STAPLE) ×3 IMPLANT
STAPLER PROXIMATE 75MM BLUE (STAPLE) ×3 IMPLANT
STAPLER SKIN PROX 35W (STAPLE) ×3 IMPLANT
STRAP SAFETY BODY (MISCELLANEOUS) ×3 IMPLANT
STRIP CLOSURE SKIN 1/2X4 (GAUZE/BANDAGES/DRESSINGS) ×2 IMPLANT
SUCTION POOLE HANDLE (INSTRUMENTS) ×3
SURGILUBE 2OZ TUBE FLIPTOP (MISCELLANEOUS) IMPLANT
SUT CHROMIC 0 CT 1 (SUTURE) ×3 IMPLANT
SUT CHROMIC 3 0 SH 27 (SUTURE) ×3 IMPLANT
SUT CHROMIC 4 0 SH 27 (SUTURE) IMPLANT
SUT ETHILON 2 0 FSLX (SUTURE) IMPLANT
SUT MAXON ABS #0 GS21 30IN (SUTURE) IMPLANT
SUT NYLON 2-0 (SUTURE) IMPLANT
SUT PROLENE 2 0 SH DA (SUTURE) IMPLANT
SUT VIC AB 5-0 RB1 27 (SUTURE) ×3 IMPLANT
SYR BULB IRRIG 60ML STRL (SYRINGE) ×3 IMPLANT
TUBING CONNECTING 10 (TUBING) ×2 IMPLANT
TUBING CONNECTING 10' (TUBING) ×1
WATER STERILE IRR 1000ML POUR (IV SOLUTION) IMPLANT

## 2015-03-28 NOTE — Op Note (Signed)
OPERATIVE REPORT  PREOPERATIVE  DIAGNOSIS: . Carcinoma of the small bowel  POSTOPERATIVE DIAGNOSIS: . Carcinoma of the ileocecal junction  PROCEDURE: . Right colectomy  ANESTHESIA:  General  SURGEON: Rochel Brome  MD   INDICATIONS: . She has history of abdominal pain. She had CT scan demonstrating a mass which appeared to involve the small bowel with partial obstruction. She appeared to be in danger of obstruction and surgery was recommended for definitive treatment  The patient was placed on the operating table in the supine position under general endotracheal anesthesia. The abdomen was prepared with ChloraPrep solution and draped in a sterile manner.  A midline incision was made from above the umbilicus to below the umbilicus and carried down through subcutaneous tissues. Several small bleeding points were cauterized. The midline fascia was incised and the peritoneal cavity was opened. There was minimal degree of ascites which was serosanguineous and was aspirated. The visible sigmoid colon appeared normal. The visible transverse colon appeared normal. There was no palpable mass within the liver. There was no distention found in the stomach. There was no palpable mass in the pelvis. There was some moderate distention of the terminal ileum with thickening of the wall of the terminal ileum. There was a mass at the ileocecal valve. There was surrounding scar tissue.  The right colon was mobilized with incision of the lateral peritoneal reflection. The right femoral artery was identified. The right ureter was identified. The right colon was further mobilized with use of Harmonic scalpel. The mesentery in the ileocecal valve area appear to be shortened with scar tissue and desmoplastic reaction. A site was selected at the mid aspect of the transverse colon right of the middle colic vessels for the distal margin of resection. A window was created in the transverse mesocolon and this site and the  mesenteric dissection was begun with the Harmonic scalpel. Another site for the proximal margin of resection was selected in the terminal ileum proximally 6 inches proximal to the ileocecal valve a window was created in the mesentery and the mesenteric dissection was begun with the Harmonic scalpel. There were some firm palpable lymph nodes included in the resection. The duodenum was identified. The ileocolic artery was ligated with 0 chromic suture ligature and divided with the Harmonic scalpel. There were easily palpable arteries remaining leading to the remaining terminal ileum. There was a remaining good palpable pulse in the middle colic artery. The small bowel was placed beside the transverse colon at the intended site of anastomosis and held with Allis clamps. An enterotomy was made and a colotomy was made. The anastomosis was begun with introduction of the 75 mm GIA stapler along the antimesenteric border which was engaged and activated. The staple line appeared to be hemostatic. The anastomosis was completed with application of the TA 60 stapler which was placed perpendicular to the first staple line engaged and activated. The specimen was excised with a scalpel and passed off to a side table. The anastomosis was inspected and numerous small bleeding points were cauterized. The junction of the staple lines was imbricated with 5-0 Vicryl. The anastomosis was widely patent. The mesenteric defect was closed with running 3-0 chromic. Hemostasis was intact. Estimated blood loss for the operation was 20 cc.  Gowns and gloves and instruments were exchanged for ones. The abdomen was further inspected and was irrigated with a warm saline solution and aspirated hemostasis was intact. The remaining omentum was brought beneath the wound. The wound was closed with interrupted  0 Maxon figure-of-eight sutures in the fascia. The skin was closed with clips. Dressings were applied with 2 inch paper tape  The patient  appeared to be in satisfactory condition and was then prepared for transfer to the recovery room  [place].D.

## 2015-03-28 NOTE — OR Nursing (Signed)
Dr. Tamala Julian notified of patient's blood sugar and still wanted the D51/4 NS.  Dr. Rosey Bath notified of patient's blood pressure and ordered labetolol

## 2015-03-28 NOTE — Anesthesia Procedure Notes (Signed)
Procedure Name: Intubation Date/Time: 03/28/2015 2:44 PM Performed by: Delaney Meigs Pre-anesthesia Checklist: Patient identified, Emergency Drugs available, Suction available, Patient being monitored and Timeout performed Patient Re-evaluated:Patient Re-evaluated prior to inductionOxygen Delivery Method: Circle system utilized Preoxygenation: Pre-oxygenation with 100% oxygen Intubation Type: IV induction Ventilation: Mask ventilation without difficulty Laryngoscope Size: Miller and 2 Grade View: Grade II Tube type: Oral Tube size: 6.5 mm Number of attempts: 2 Airway Equipment and Method: Stylet Placement Confirmation: ETT inserted through vocal cords under direct vision,  positive ETCO2 and breath sounds checked- equal and bilateral Secured at: 22 cm Tube secured with: Tape Dental Injury: Teeth and Oropharynx as per pre-operative assessment

## 2015-03-28 NOTE — Anesthesia Postprocedure Evaluation (Signed)
  Anesthesia Post-op Note  Patient: [name]  Procedure(s) Performed: Procedure(s): SMALL BOWEL RESECTION (N/A)  Anesthesia type:General  Patient location: PACU  Post pain: Pain level controlled  Post assessment: Post-op Vital signs reviewed, Patient's Cardiovascular Status Stable, Respiratory Function Stable, Patent Airway and No signs of Nausea or vomiting  Post vital signs: Reviewed and stable  Last Vitals:  Filed Vitals:   03/28/15 2019  BP: 180/86  Pulse: 84  Temp: 36.4 C  Resp: 18    Level of consciousness: awake, alert  and patient cooperative  Complications: No apparent anesthesia complications

## 2015-03-28 NOTE — Anesthesia Preprocedure Evaluation (Signed)
Anesthesia Evaluation  Patient identified by MRN, date of birth, ID band Patient awake    Reviewed: Allergy & Precautions, NPO status , Patient's Chart, lab work & pertinent test results  Airway Mallampati: III       Dental  (+) Teeth Intact   Pulmonary former smoker,    Pulmonary exam normal        Cardiovascular hypertension, Pt. on medications Normal cardiovascular exam     Neuro/Psych TIA   GI/Hepatic negative GI ROS, Neg liver ROS,   Endo/Other  diabetes, Type 2  Renal/GU      Musculoskeletal negative musculoskeletal ROS (+)   Abdominal Normal abdominal exam  (+)   Peds  Hematology negative hematology ROS (+)   Anesthesia Other Findings   Reproductive/Obstetrics                             Anesthesia Physical Anesthesia Plan  ASA: III  Anesthesia Plan: General   Post-op Pain Management:    Induction: Intravenous  Airway Management Planned: Oral ETT  Additional Equipment:   Intra-op Plan:   Post-operative Plan: Extubation in OR  Informed Consent: I have reviewed the patients History and Physical, chart, labs and discussed the procedure including the risks, benefits and alternatives for the proposed anesthesia with the patient or authorized representative who has indicated his/her understanding and acceptance.     Plan Discussed with: CRNA  Anesthesia Plan Comments:         Anesthesia Quick Evaluation

## 2015-03-28 NOTE — H&P (Signed)
  She reports no change in condition since the day of the office visit. She reports she has been drinking liquids and Glucerna.  On examination her abdomen is minimally distended and soft and nontender  Diagnosis partially obstructing neoplasm of small bowel. Plan is to do a small bowel resection as discussed  Rochel Brome M.D.

## 2015-03-28 NOTE — Transfer of Care (Signed)
Immediate Anesthesia Transfer of Care Note  Patient: [name]  Procedure(s) Performed: Procedure(s): SMALL BOWEL RESECTION (N/A)  Patient Location: PACU  Anesthesia Type:General  Level of Consciousness: sedated and responds to stimulation  Airway & Oxygen Therapy: Patient Spontanous Breathing and Patient connected to face mask oxygen  Post-op Assessment: Report given to RN and Post -op Vital signs reviewed and stable  Post vital signs: Reviewed and stable  Last Vitals:  Filed Vitals:   03/28/15 1727  BP: 177/74  Pulse: 70  Temp: 37.3 C  Resp: 15    Complications: No apparent anesthesia complications

## 2015-03-29 ENCOUNTER — Encounter: Payer: Self-pay | Admitting: Surgery

## 2015-03-29 ENCOUNTER — Inpatient Hospital Stay: Payer: Medicare Other

## 2015-03-29 LAB — GLUCOSE, CAPILLARY
Glucose-Capillary: 335 mg/dL — ABNORMAL HIGH (ref 65–99)
Glucose-Capillary: 342 mg/dL — ABNORMAL HIGH (ref 65–99)
Glucose-Capillary: 244 mg/dL — ABNORMAL HIGH (ref 65–99)
Glucose-Capillary: 237 mg/dL — ABNORMAL HIGH (ref 65–99)

## 2015-03-29 LAB — BASIC METABOLIC PANEL
Anion gap: 7 (ref 5–15)
BUN: 18 mg/dL (ref 6–20)
CO2: 24 mmol/L (ref 22–32)
Calcium: 7.6 mg/dL — ABNORMAL LOW (ref 8.9–10.3)
Chloride: 106 mmol/L (ref 101–111)
Creatinine, Ser: 0.98 mg/dL (ref 0.44–1.00)
GFR calc Af Amer: >60 mL/min (ref >60)
GFR calc non Af Amer: 53 mL/min — ABNORMAL LOW (ref >60)
Glucose, Bld: 370 mg/dL — ABNORMAL HIGH (ref 65–99)
Potassium: 4.3 mmol/L (ref 3.5–5.1)
Sodium: 137 mmol/L (ref 135–145)

## 2015-03-29 LAB — CREATININE, SERUM
Creatinine, Ser: 0.93 mg/dL (ref 0.44–1.00)
GFR calc Af Amer: >60 mL/min (ref >60)
GFR calc non Af Amer: 56 mL/min — ABNORMAL LOW (ref >60)

## 2015-03-29 LAB — CBC
HCT: 32.5 % — ABNORMAL LOW (ref 35.0–47.0)
Hemoglobin: 10 g/dL — ABNORMAL LOW (ref 12.0–16.0)
MCH: 23.8 pg — ABNORMAL LOW (ref 26.0–34.0)
MCHC: 30.7 g/dL — ABNORMAL LOW (ref 32.0–36.0)
MCV: 77.5 fL — ABNORMAL LOW (ref 80.0–100.0)
Platelets: 706 10*3/uL — ABNORMAL HIGH (ref 150–440)
RBC: 4.19 MIL/uL (ref 3.80–5.20)
RDW: 17.3 % — ABNORMAL HIGH (ref 11.5–14.5)
WBC: 25.8 10*3/uL — ABNORMAL HIGH (ref 3.6–11.0)

## 2015-03-29 LAB — HEMOGLOBIN A1C: Hgb A1c MFr Bld: 7 % — ABNORMAL HIGH (ref 4.0–6.0)

## 2015-03-29 MED ORDER — ASPIRIN 81 MG PO CHEW
81.0000 mg | CHEWABLE_TABLET | Freq: Every day | ORAL | Status: DC
  Administered 2015-03-29 – 2015-03-31 (×3): 81 mg via ORAL
  Filled 2015-03-29 (×3): qty 1

## 2015-03-29 MED ORDER — INSULIN ASPART 100 UNIT/ML ~~LOC~~ SOLN
0.0000 [IU] | Freq: Every day | SUBCUTANEOUS | Status: DC
  Administered 2015-03-29 – 2015-04-02 (×2): 2 [IU] via SUBCUTANEOUS
  Filled 2015-03-29 (×2): qty 2

## 2015-03-29 MED ORDER — INSULIN ASPART 100 UNIT/ML ~~LOC~~ SOLN
0.0000 [IU] | Freq: Three times a day (TID) | SUBCUTANEOUS | Status: DC
  Administered 2015-03-29: 3 [IU] via SUBCUTANEOUS
  Administered 2015-03-29: 7 [IU] via SUBCUTANEOUS
  Administered 2015-03-30 (×2): 3 [IU] via SUBCUTANEOUS
  Administered 2015-03-30: 2 [IU] via SUBCUTANEOUS
  Administered 2015-03-31 – 2015-04-01 (×4): 1 [IU] via SUBCUTANEOUS
  Administered 2015-04-01: 2 [IU] via SUBCUTANEOUS
  Administered 2015-04-02 (×2): 1 [IU] via SUBCUTANEOUS
  Administered 2015-04-02 – 2015-04-03 (×2): 2 [IU] via SUBCUTANEOUS
  Administered 2015-04-03: 1 [IU] via SUBCUTANEOUS
  Filled 2015-03-29: qty 3
  Filled 2015-03-29: qty 1
  Filled 2015-03-29: qty 3
  Filled 2015-03-29 (×3): qty 2
  Filled 2015-03-29 (×2): qty 1
  Filled 2015-03-29: qty 3
  Filled 2015-03-29 (×2): qty 1
  Filled 2015-03-29: qty 2
  Filled 2015-03-29 (×2): qty 1
  Filled 2015-03-29: qty 7

## 2015-03-29 MED ORDER — ALUM & MAG HYDROXIDE-SIMETH 200-200-20 MG/5ML PO SUSP
30.0000 mL | Freq: Four times a day (QID) | ORAL | Status: DC | PRN
  Administered 2015-03-29 – 2015-03-30 (×2): 30 mL via ORAL
  Filled 2015-03-29 (×2): qty 30

## 2015-03-29 MED ORDER — INSULIN ASPART 100 UNIT/ML ~~LOC~~ SOLN
3.0000 [IU] | Freq: Three times a day (TID) | SUBCUTANEOUS | Status: DC
  Administered 2015-03-29: 3 [IU] via SUBCUTANEOUS
  Filled 2015-03-29: qty 3

## 2015-03-29 NOTE — Care Management Important Message (Signed)
Important Message  Patient Details  Name: [name] MRN: [id number] Date of Birth: 01-26-1932   Medicare Important Message Given:  Yes-second notification given    Alvie Heidelberg, RN 03/29/2015, 10:20 AM

## 2015-03-29 NOTE — Consult Note (Signed)
[place] [facility] [place] NAME: [name]    MR#:  [id number]  DATE OF BIRTH:  03/31/1932  DATE OF ADMISSION:  03/28/2015  PRIMARY CARE PHYSICIAN: Madelyn Brunner, MD   REQUESTING/REFERRING PHYSICIAN: Leonie Green, MD  CHIEF COMPLAINT:  No chief complaint on file.   HISTORY OF PRESENT ILLNESS:  [name]  is a 81 y.o. female with a known history of hypertension, diabetes who was found to have partial small bowel obstruction on recent CT scan with concern for underlying carcinoma, underwent surgery on sixth of October.  She underwent right sided colectomy for possible carcinoma of the ileocecal junction.  While in postoperative recovery room her blood pressure was very elevated, requiring labetalol. This morning she was up to bedside commode and became lethargic and was helped back into bed by  Nursing.  He was feeling very hot and had mild headache and some staggering gait.  He felt like she was having another TIA similar to what she had it back in June 2016.  She reports not able to sleep very well and may have slept about 4-5 hours due to surgical site pain.  Is feeling fine at this time  PAST MEDICAL HISTORY:   Past Medical History  Diagnosis Date  . Hypertension   . Hyperuricemia   . Diabetes mellitus without complication ([place])   . H/O TIA (transient ischemic attack) and stroke   . Adenocarcinoma in situ of cervix   . H/O compression fracture of spine 2014    thoracic spine  . Microalbuminuria   . Trochanteric bursitis   . Stroke ([place])   . H/O polycythemia vera   . Recurrent falls   . Polycythemia vera ([place])   . Endometrial carcinoma ([facility])     s/p total abdominal hysterectomy  . Breast cancer ([place])   . Skin cancer     face, legs   PAST SURGICAL HISTOIRY:   Past Surgical History  Procedure Laterality Date  . Tonsillectomy    . Exploratory laparotomy      for fibroids  . Cataract extraction w/ intraocular  lens implant Right   . Abdominal hysterectomy    . Bowel resection N/A 03/28/2015    Procedure: SMALL BOWEL RESECTION;  Surgeon: Leonie Green, MD;  Location: [facility] ORS;  Service: General;  Laterality: N/A;   SOCIAL HISTORY:   Social History  Substance Use Topics  . Smoking status: Former Research scientist (life sciences)  . Smokeless tobacco: Never Used  . Alcohol Use: No   FAMILY HISTORY:   Family History  Problem Relation Age of Onset  . Heart attack Mother   . Breast cancer Mother   . Heart attack Father   . Heart attack Sister   . Diabetes Sister    DRUG ALLERGIES:   Allergies  Allergen Reactions  . Simvastatin     Other reaction(s): Muscle Pain   REVIEW OF SYSTEMS:  CONSTITUTIONAL: No fever, fatigue or weakness.  EYES: No blurred or double vision.  EARS, NOSE, AND THROAT: No tinnitus or ear pain.  RESPIRATORY: No cough, shortness of breath, wheezing or hemoptysis.  CARDIOVASCULAR: No chest pain, orthopnea, edema.  GASTROINTESTINAL: No nausea, vomiting, diarrhea or abdominal pain.  GENITOURINARY: No dysuria, hematuria.  ENDOCRINE: No polyuria, nocturia,  HEMATOLOGY: No anemia, easy bruising or bleeding SKIN: No rash or lesion. MUSCULOSKELETAL: No joint pain or arthritis.   NEUROLOGIC: No tingling, numbness, weakness.  PSYCHIATRY: No anxiety or depression.  MEDICATIONS  AT HOME:   Prior to Admission medications   Medication Sig Start Date End Date Taking? Authorizing Provider  alendronate (FOSAMAX) 70 MG tablet Take 70 mg by mouth once a week.    Yes Historical Provider, MD  aspirin EC 81 MG tablet Take 81 mg by mouth daily.   Yes Historical Provider, MD  calcium-vitamin D (OSCAL WITH D) 500-200 MG-UNIT per tablet Take 1 tablet by mouth daily.    Yes Historical Provider, MD  carvedilol (COREG) 6.25 MG tablet Take 6.25 mg by mouth 2 (two) times daily with a meal.   Yes Historical Provider, MD  clopidogrel (PLAVIX) 75 MG tablet Take 1 tablet (75 mg total) by mouth daily. 12/23/14  Yes  Fritzi Mandes, MD  docusate sodium (COLACE) 250 MG capsule Take 250 mg by mouth at bedtime.   Yes Historical Provider, MD  fexofenadine (ALLEGRA) 180 MG tablet Take 180 mg by mouth daily as needed.    Yes Historical Provider, MD  hydrALAZINE (APRESOLINE) 50 MG tablet Take 1 tablet (50 mg total) by mouth every 8 (eight) hours. Patient taking differently: Take 25 mg by mouth 2 (two) times daily.  12/23/14  Yes Fritzi Mandes, MD  hydroxyurea (HYDREA) 500 MG capsule Take 500 mg by mouth daily.    Yes Historical Provider, MD  metFORMIN (GLUCOPHAGE) 1000 MG tablet Take 1,000 mg by mouth 2 (two) times daily.    Yes Historical Provider, MD  polyethylene glycol (MIRALAX / GLYCOLAX) packet Take 17 g by mouth daily as needed.   Yes Historical Provider, MD  potassium chloride (K-DUR,KLOR-CON) 10 MEQ tablet Take 10 mEq by mouth daily.   Yes Historical Provider, MD  pravastatin (PRAVACHOL) 20 MG tablet Take 20 mg by mouth at bedtime.    Yes Historical Provider, MD  Probiotic Product (PROBIOTIC DAILY PO) Take 1 tablet by mouth daily.   Yes Historical Provider, MD  ramipril (ALTACE) 10 MG capsule Take 10 mg by mouth 2 (two) times daily.    Yes Historical Provider, MD  verapamil (CALAN-SR) 240 MG CR tablet Take 240 mg by mouth daily.   Yes Historical Provider, MD   VITAL SIGNS:  Blood pressure 153/61, pulse 89, temperature 97.9 F (36.6 C), temperature source Oral, resp. rate 22, height 5\' 1"  (1.549 m), weight 62.596 kg (138 lb), SpO2 100 %. PHYSICAL EXAMINATION:  GENERAL:  81 y.o.-year-old patient lying in the bed with no acute distress.  EYES: Pupils equal, round, reactive to light and accommodation. No scleral icterus. Extraocular muscles intact.  HEENT: Head atraumatic, normocephalic. Oropharynx and nasopharynx clear.  NECK:  Supple, no jugular venous distention. No thyroid enlargement, no tenderness.  LUNGS: Normal breath sounds bilaterally, no wheezing, rales,rhonchi or crepitation. No use of accessory muscles of  respiration.  CARDIOVASCULAR: S1, S2 normal. No murmurs, rubs, or gallops.  ABDOMEN: Soft, nontender, nondistended. Bowel sounds present. No organomegaly or mass.  EXTREMITIES: No pedal edema, cyanosis, or clubbing.  NEUROLOGIC: Cranial nerves II through XII are intact. Muscle strength 5/5 in all extremities. Sensation intact. Gait not checked.  PSYCHIATRIC: The patient is alert and oriented x 3.  SKIN: No obvious rash, lesion, or ulcer.  LABORATORY PANEL:   CBC  Recent Labs Lab 03/29/15 0625  WBC 25.8*  HGB 10.0*  HCT 32.5*  PLT 706*   ------------------------------------------------------------------------------------------------------------------  Chemistries   Recent Labs Lab 03/29/15 0625  NA 137  K 4.3  CL 106  CO2 24  GLUCOSE 370*  BUN 18  CREATININE 0.98  CALCIUM 7.6*  RADIOLOGY:  Mr Herby Abraham Contrast  03/29/2015   CLINICAL DATA:  TIA. Sudden onset of hot feeling and vomiting this morning. Fell 1 week ago and hit head. Small bowel resection yesterday.  EXAM: MRI HEAD WITHOUT CONTRAST  TECHNIQUE: Multiplanar, multiecho pulse sequences of the brain and surrounding structures were obtained without intravenous contrast.  COMPARISON:  12/21/2014  FINDINGS: Some sequences are mildly motion degraded.  There is a 5 mm oval focus of well-defined diffusion-weighted signal abnormality in the right temporal lobe suggestive of small acute infarct, however this is not confirmed on coronal diffusion weighted imaging and it is possible (although felt less likely) this could reflect artifact from the nearby petrous bone. There is suggestion of a small amount of FLAIR hyperintensity in this location which would favor this being a true acute infarct rather than artifact.  There is moderate generalized cerebral atrophy. A few foci of chronic microhemorrhage are again seen in the posterior right temporal lobe and left cerebellum. Patchy and confluent T2 hyperintensities in the subcortical  and deep cerebral white matter bilaterally are similar to the prior MRI and compatible with moderate chronic small vessel ischemic disease.  Prior right cataract extraction is noted. No significant inflammatory disease is seen in the paranasal sinuses or mastoid air cells. Major intracranial vascular flow voids are preserved.  IMPRESSION: 1. 5 mm focus of abnormal diffusion signal in the right temporal lobe favored to reflect a small acute infarct over artifact. 2. Moderate chronic small vessel ischemic disease and cerebral atrophy.   Electronically Signed   By: Logan Bores M.D.   On: 03/29/2015 13:53   IMPRESSION AND PLAN:   * Acute CVA:  After discussion with Dr. Rochel Brome and based on her recent history of TIA along with multiple medical comorbidities, also being off of Plavix for 7 days and aspirin for 3 days for planned surgery - she is at high risk for stroke.  MRI was ordered, which has been done and is showing also a small acute infarct in the right temporal lobe.  Case discussed with Dr. Tamala Julian -he is agreeable to restart baby Aspirin.  Consult neurology  * Uncontrolled diabetes: Consult diabetic nurse coordinator, increase her dose of NovoLog insulin at meal and sliding scale.    * Chest pain: Pleuritic in nature, with underlying possible malignancy.  We will need to rule out pulmonary embolism. get CT angiogram.  Symptomatic management till then  * Hypertension.  Continue home medications    All the records are reviewed and case discussed with Consulting provider. Management plans discussed with the patient, family and they are in agreement.  CODE STATUS: Full Code  TOTAL TIME TAKING CARE OF THIS PATIENT: 55 minutes.    [facility], Mutasim Tuckey M.D on 03/29/2015 at 4:07 PM  Between 7am to 6pm - Pager - [phone]  After 6pm go to [email or web address] - password [email or web address] [facility]  [place] [facility]  Office  [phone]  CC: Primary care Physician: Madelyn Brunner, MD Leonie Green, MD

## 2015-03-29 NOTE — Progress Notes (Signed)
Reference is made to this morning's note.  She had a spell of lethargy.  Console to Dr. Manuella Ghazi in internal medicine.  An MRI nose shows the 5 mm abnormality in the right temporal lobe possibly infarction.  She reports when she was turning over in bed earlier this afternoon she has a pain in the right pectoral region.  She reports breathing satisfactorily.  She reports minimal degree of nausea during the course of the day.  Vital signs are stable  GENERAL:  The patient is awake, alert and oriented,  in no acute distress..  Lung sounds are clear.  Heart regular rhythm S1-S2.  Abdomen is soft with minimal tenderness.  Dressing with scant serosanguineous drainage at the lower end of the wound.  Anticipate starting a liquid diet after her nausea resolves.  She hopes to drink some Glucerna  Soon.  Encourage walking with assistance.  She has received 81 mg of aspirin and also on subcutaneous Lovenox

## 2015-03-29 NOTE — Progress Notes (Signed)
Pt Able to achieve 500 on IS. Able to transfer to [facility] with 1 assist. Pt has voided post op. Surgical abd site with dsg C/D/I small amount of bloody noted on bottom of dsg, marked.   Called to room by NT around 0654 am , Upon transferring pt to [facility] pt had syncopal episode. Pt appears lethargic, different from last night, arousal but not as spontaneous as before. Rapid Response called and Dr. Tamala Julian at bedside to check pt. Vitals signs taken and CBG checked see results. Charge Nurse and dayshift  nurse to follow made aware.

## 2015-03-29 NOTE — Progress Notes (Signed)
Inpatient Diabetes Program Recommendations  AACE/ADA: New Consensus Statement on Inpatient Glycemic Control (2015)  Target Ranges:  Prepandial:   less than 140 mg/dL      Peak postprandial:   less than 180 mg/dL (1-2 hours)      Critically ill patients:  140 - 180 mg/dL   Review of Glycemic Control  Results for SHAMBHAVI, SALLEY (MRN [id number]) as of 03/29/2015 09:41  Ref. Range 03/28/2015 11:41 03/28/2015 17:30 03/29/2015 07:23  Glucose-Capillary Latest Ref Range: 65-99 mg/dL 148 (H) 169 (H) 335 (H)    Diabetes history: Type 2 Outpatient Diabetes medications: Metformin 1000mg  bid Current orders for Inpatient glycemic control: none  Inpatient Diabetes Program Recommendations: Please consider ordering Novolog 0-9 units tid. Novolog 0-5 units qhs  Gentry Fitz, RN, [place], , CDE Diabetes Coordinator Inpatient Diabetes Program  [phone] (Team Pager) [phone] ([place]) 03/29/2015 9:40 AM

## 2015-03-29 NOTE — Progress Notes (Signed)
   03/29/15 0732  Clinical Encounter Type  Visited With Patient not available;Health care provider  Visit Type Critical Care  Referral From Nurse  Consult/Referral To Chaplain  Chaplain paged to unit for rapid response and provided silent prayer for patient and staff.   Chaplain Rajvir Ernster [phone]

## 2015-03-29 NOTE — Progress Notes (Signed)
Pt c/o sudden onset right side cp. No radiation. No shortness of breath. No dizziness or nausea. VSS. Paged Dr Manuella Ghazi.  Order to give maalox and asa.  Also Chest CT.  States to call him back if pt does not get relief.  Also states to tell niece that neurology will be coming to go over MRI with them .

## 2015-03-29 NOTE — Progress Notes (Signed)
She was hypertensive in the recovery room last night and was given labetalol. Her blood pressure is improved this morning. The nurse called and reported that she had been more awake and alert during the night evaluations. This morning she was up to bedside commode and became lethargic and was helped back into bed. Upon my arrival she is more awake and is talking but still slightly lethargic. He is moving all extremities  Past medical history does include a transient ischemic attack several months ago and has been taking Plavix which was stopped last week. She is been off of aspirin for several days.  She is afebrile. Blood pressure is improved. And saturation 100% she is slightly drowsy but talking. She is moving all extremities. Lung sounds are clear. Heart regular rhythm S1 and S2. Abdomen is soft with no significant tenderness. The dressing was with minimal serosanguineous drainage and was changed. During examination she did have some momentary nausea and spit up about of cc of nonbloody fluid.  Clinical data reviewed. Blood sugar 370, white blood count 25,000, hemoglobin 10.0  Impression episode of lethargy, history of TIA, status post right colectomy for obstructing neoplasm  I have discussed this with Dr. Manuella Ghazi in internal medicine requesting internal medicine consultation. Primary care physician is Dr. Dario Ave  Will likely need sliding scale subcutaneous insulin

## 2015-03-29 NOTE — Progress Notes (Signed)
After episode of CP pt was given maalox and morphine which provided relief from CP. Pt states she "felt like it was just indigestion" Pt to have CT for the chest pain but has no 20 gauge IV access. Multiple unsuccessful attempt to obtain IV. MD Manuella Ghazi called and stated if pt had any further pain to call immediately and nitro could be given and further measures. Will continue to monitor closely.

## 2015-03-30 ENCOUNTER — Inpatient Hospital Stay: Payer: Medicare Other

## 2015-03-30 ENCOUNTER — Inpatient Hospital Stay
Admission: RE | Admit: 2015-03-30 | Discharge: 2015-03-30 | Disposition: A | Discharge status: Home / Self Care | Payer: Medicare Other | Source: Ambulatory Visit | Attending: Internal Medicine | Admitting: Internal Medicine

## 2015-03-30 LAB — BASIC METABOLIC PANEL
Anion gap: 3 — ABNORMAL LOW (ref 5–15)
BUN: 22 mg/dL — ABNORMAL HIGH (ref 6–20)
CO2: 25 mmol/L (ref 22–32)
Calcium: 7.5 mg/dL — ABNORMAL LOW (ref 8.9–10.3)
Chloride: 104 mmol/L (ref 101–111)
Creatinine, Ser: 0.95 mg/dL (ref 0.44–1.00)
GFR calc Af Amer: >60 mL/min (ref >60)
GFR calc non Af Amer: 55 mL/min — ABNORMAL LOW (ref >60)
Glucose, Bld: 213 mg/dL — ABNORMAL HIGH (ref 65–99)
Potassium: 5.6 mmol/L — ABNORMAL HIGH (ref 3.5–5.1)
Sodium: 132 mmol/L — ABNORMAL LOW (ref 135–145)

## 2015-03-30 LAB — GLUCOSE, CAPILLARY
Glucose-Capillary: 243 mg/dL — ABNORMAL HIGH (ref 65–99)
Glucose-Capillary: 209 mg/dL — ABNORMAL HIGH (ref 65–99)
Glucose-Capillary: 170 mg/dL — ABNORMAL HIGH (ref 65–99)
Glucose-Capillary: 125 mg/dL — ABNORMAL HIGH (ref 65–99)

## 2015-03-30 LAB — MAGNESIUM: Magnesium: 1.8 mg/dL (ref 1.7–2.4)

## 2015-03-30 MED ORDER — SODIUM CHLORIDE 0.45 % IV SOLN
INTRAVENOUS | Status: DC
  Administered 2015-03-30: 15:00:00 via INTRAVENOUS

## 2015-03-30 MED ORDER — LACOSAMIDE 50 MG PO TABS
50.0000 mg | ORAL_TABLET | Freq: Two times a day (BID) | ORAL | Status: DC
  Administered 2015-03-31 – 2015-04-03 (×7): 50 mg via ORAL
  Filled 2015-03-30 (×7): qty 1

## 2015-03-30 MED ORDER — SODIUM CHLORIDE 0.9 % IV SOLN
100.0000 mg | Freq: Once | INTRAVENOUS | Status: AC
  Administered 2015-03-30: 100 mg via INTRAVENOUS
  Filled 2015-03-30: qty 10

## 2015-03-30 MED ORDER — IOHEXOL 350 MG/ML SOLN
80.0000 mL | Freq: Once | INTRAVENOUS | Status: AC | PRN
  Administered 2015-03-30: 75 mL via INTRAVENOUS

## 2015-03-30 MED ORDER — POTASSIUM CHLORIDE IN NACL 20-0.45 MEQ/L-% IV SOLN
INTRAVENOUS | Status: DC
Start: 2015-03-30 — End: 2015-03-30
  Administered 2015-03-30: 09:00:00 via INTRAVENOUS
  Filled 2015-03-30 (×2): qty 1000

## 2015-03-30 NOTE — Progress Notes (Signed)
Afebrile, vital signs stable. Good urine output.  No recurrent right shoulder pain as experienced yesterday.  Reports some nausea but no vomiting.  Lungs: Clear.  Cardiac: Regular rhythm.  Abdomen: Nontender, minimal distention. Single drop of old blood at the inferior aspect. No erythema or induration.  Calves: Soft, nontender.  Yesterday laboratory studies reviewed. White blood cell count 25,000. Hemoglobin near baseline. (History polycythemia)  Will work towards ambulation today. Clear liquids as tolerated. Repeat labs in the morning.

## 2015-03-30 NOTE — Progress Notes (Signed)
*  PRELIMINARY RESULTS* Echocardiogram 2D Echocardiogram has been performed.  [name] 03/30/2015, 1:38 PM

## 2015-03-30 NOTE — Progress Notes (Signed)
Spoke with Dr. Darvin Neighbours about patient complaint of "muscle twitching".  Dr. Darvin Neighbours will order lab work.

## 2015-03-30 NOTE — Consult Note (Signed)
Reason for Consult: stroke Referring Physician: Dr. Ileana Roup is an 81 y.o. female.  HPI: seen at request of Dr. Manuella Ghazi for stroke;  81 yo RHD F who had an elective abdominal surgery with a post op course complicated by confusion and unresponsiveness.  Pt was initially fine after procedure but then became confused.  Pt continues to have dizziness, mild headache with severe N/V that is not really responsive to medications.  Pt was seen in July 2016 when questionable small R stroke with the same episode as this almost.  Her friend is at bedside and states that she is at her baseline.  Past Medical History  Diagnosis Date  . Hypertension   . Hyperuricemia   . Diabetes mellitus without complication ([place])   . H/O TIA (transient ischemic attack) and stroke   . Adenocarcinoma in situ of cervix   . H/O compression fracture of spine 2014    thoracic spine  . Microalbuminuria   . Trochanteric bursitis   . Stroke ([place])   . H/O polycythemia vera   . Recurrent falls   . Polycythemia vera ([place])   . Endometrial carcinoma ([facility])     s/p total abdominal hysterectomy  . Breast cancer ([place])   . Skin cancer     face, legs    Past Surgical History  Procedure Laterality Date  . Tonsillectomy    . Exploratory laparotomy      for fibroids  . Cataract extraction w/ intraocular lens implant Right   . Abdominal hysterectomy    . Bowel resection N/A 03/28/2015    Procedure: SMALL BOWEL RESECTION;  Surgeon: Leonie Green, MD;  Location: [facility] ORS;  Service: General;  Laterality: N/A;    Family History  Problem Relation Age of Onset  . Heart attack Mother   . Breast cancer Mother   . Heart attack Father   . Heart attack Sister   . Diabetes Sister     Social History:  reports that she has quit smoking. She has never used smokeless tobacco. She reports that she does not drink alcohol or use illicit drugs.  Allergies:  Allergies  Allergen Reactions  . Simvastatin     Other  reaction(s): Muscle Pain    Medications:  Personally reviewed by me per chart  Results for orders placed or performed during the hospital encounter of 03/28/15 (from the past 48 hour(s))  Glucose, capillary     Status: Abnormal   Collection Time: 03/28/15  5:30 PM  Result Value Ref Range   Glucose-Capillary 169 (H) 65 - 99 mg/dL  Creatinine, serum     Status: Abnormal   Collection Time: 03/29/15  1:08 AM  Result Value Ref Range   Creatinine, Ser 0.93 0.44 - 1.00 mg/dL   GFR calc non Af Amer 56 (L) >60 mL/min   GFR calc Af Amer >60 >60 mL/min    Comment: (NOTE) The eGFR has been calculated using the CKD EPI equation. This calculation has not been validated in all clinical situations. eGFR's persistently <60 mL/min signify possible Chronic Kidney Disease.   Basic metabolic panel     Status: Abnormal   Collection Time: 03/29/15  6:25 AM  Result Value Ref Range   Sodium 137 135 - 145 mmol/L   Potassium 4.3 3.5 - 5.1 mmol/L   Chloride 106 101 - 111 mmol/L   CO2 24 22 - 32 mmol/L   Glucose, Bld 370 (H) 65 - 99 mg/dL   BUN 18 6 -  20 mg/dL   Creatinine, Ser 0.98 0.44 - 1.00 mg/dL   Calcium 7.6 (L) 8.9 - 10.3 mg/dL   GFR calc non Af Amer 53 (L) >60 mL/min   GFR calc Af Amer >60 >60 mL/min    Comment: (NOTE) The eGFR has been calculated using the CKD EPI equation. This calculation has not been validated in all clinical situations. eGFR's persistently <60 mL/min signify possible Chronic Kidney Disease.    Anion gap 7 5 - 15  CBC     Status: Abnormal   Collection Time: 03/29/15  6:25 AM  Result Value Ref Range   WBC 25.8 (H) 3.6 - 11.0 K/uL   RBC 4.19 3.80 - 5.20 MIL/uL   Hemoglobin 10.0 (L) 12.0 - 16.0 g/dL   HCT 32.5 (L) 35.0 - 47.0 %   MCV 77.5 (L) 80.0 - 100.0 fL   MCH 23.8 (L) 26.0 - 34.0 pg   MCHC 30.7 (L) 32.0 - 36.0 g/dL   RDW 17.3 (H) 11.5 - 14.5 %   Platelets 706 (H) 150 - 440 K/uL  Hemoglobin A1c     Status: Abnormal   Collection Time: 03/29/15  6:25 AM  Result  Value Ref Range   Hgb A1c MFr Bld 7.0 (H) 4.0 - 6.0 %  Glucose, capillary     Status: Abnormal   Collection Time: 03/29/15  7:23 AM  Result Value Ref Range   Glucose-Capillary 335 (H) 65 - 99 mg/dL  Glucose, capillary     Status: Abnormal   Collection Time: 03/29/15 10:47 AM  Result Value Ref Range   Glucose-Capillary 342 (H) 65 - 99 mg/dL  Glucose, capillary     Status: Abnormal   Collection Time: 03/29/15  4:53 PM  Result Value Ref Range   Glucose-Capillary 244 (H) 65 - 99 mg/dL   Comment 1 Notify RN   Glucose, capillary     Status: Abnormal   Collection Time: 03/29/15  9:11 PM  Result Value Ref Range   Glucose-Capillary 237 (H) 65 - 99 mg/dL  Glucose, capillary     Status: Abnormal   Collection Time: 03/30/15  7:33 AM  Result Value Ref Range   Glucose-Capillary 243 (H) 65 - 99 mg/dL  Glucose, capillary     Status: Abnormal   Collection Time: 03/30/15 11:23 AM  Result Value Ref Range   Glucose-Capillary 209 (H) 65 - 99 mg/dL  Basic metabolic panel     Status: Abnormal   Collection Time: 03/30/15 12:46 PM  Result Value Ref Range   Sodium 132 (L) 135 - 145 mmol/L   Potassium 5.6 (H) 3.5 - 5.1 mmol/L   Chloride 104 101 - 111 mmol/L   CO2 25 22 - 32 mmol/L   Glucose, Bld 213 (H) 65 - 99 mg/dL   BUN 22 (H) 6 - 20 mg/dL   Creatinine, Ser 0.95 0.44 - 1.00 mg/dL   Calcium 7.5 (L) 8.9 - 10.3 mg/dL   GFR calc non Af Amer 55 (L) >60 mL/min   GFR calc Af Amer >60 >60 mL/min    Comment: (NOTE) The eGFR has been calculated using the CKD EPI equation. This calculation has not been validated in all clinical situations. eGFR's persistently <60 mL/min signify possible Chronic Kidney Disease.    Anion gap 3 (L) 5 - 15  Magnesium     Status: None   Collection Time: 03/30/15 12:46 PM  Result Value Ref Range   Magnesium 1.8 1.7 - 2.4 mg/dL    Mr Brain  Wo Contrast  03/29/2015   CLINICAL DATA:  TIA. Sudden onset of hot feeling and vomiting this morning. Fell 1 week ago and hit head.  Small bowel resection yesterday.  EXAM: MRI HEAD WITHOUT CONTRAST  TECHNIQUE: Multiplanar, multiecho pulse sequences of the brain and surrounding structures were obtained without intravenous contrast.  COMPARISON:  12/21/2014  FINDINGS: Some sequences are mildly motion degraded.  There is a 5 mm oval focus of well-defined diffusion-weighted signal abnormality in the right temporal lobe suggestive of small acute infarct, however this is not confirmed on coronal diffusion weighted imaging and it is possible (although felt less likely) this could reflect artifact from the nearby petrous bone. There is suggestion of a small amount of FLAIR hyperintensity in this location which would favor this being a true acute infarct rather than artifact.  There is moderate generalized cerebral atrophy. A few foci of chronic microhemorrhage are again seen in the posterior right temporal lobe and left cerebellum. Patchy and confluent T2 hyperintensities in the subcortical and deep cerebral white matter bilaterally are similar to the prior MRI and compatible with moderate chronic small vessel ischemic disease.  Prior right cataract extraction is noted. No significant inflammatory disease is seen in the paranasal sinuses or mastoid air cells. Major intracranial vascular flow voids are preserved.  IMPRESSION: 1. 5 mm focus of abnormal diffusion signal in the right temporal lobe favored to reflect a small acute infarct over artifact. 2. Moderate chronic small vessel ischemic disease and cerebral atrophy.   Electronically Signed   By: Logan Bores M.D.   On: 03/29/2015 13:53    Review of Systems  Constitutional: Negative.   HENT: Negative for congestion, ear discharge, ear pain, hearing loss, nosebleeds, sore throat and tinnitus.   Eyes: Negative.   Respiratory: Negative.  Negative for stridor.   Cardiovascular: Negative.   Gastrointestinal: Positive for nausea, vomiting and abdominal pain. Negative for heartburn, diarrhea,  constipation, blood in stool and melena.  Genitourinary: Negative.   Musculoskeletal: Negative.   Skin: Negative.   Neurological: Positive for dizziness, loss of consciousness and headaches. Negative for tingling, tremors, sensory change, speech change, focal weakness and seizures.  Endo/Heme/Allergies: Negative.   Psychiatric/Behavioral: Negative.    Blood pressure 162/57, pulse 79, temperature 98.1 F (36.7 C), temperature source Oral, resp. rate 16, height 5' 1"  (1.549 m), weight 62.596 kg (138 lb), SpO2 92 %. Physical Exam  Constitutional: She is oriented to person, place, and time. She appears well-developed and well-nourished. She appears distressed.  HENT:  Head: Normocephalic and atraumatic.  Right Ear: External ear normal.  Left Ear: External ear normal.  Nose: Nose normal.  Mouth/Throat: Oropharynx is clear and moist.  Eyes: Conjunctivae and EOM are normal. Pupils are equal, round, and reactive to light. Right eye exhibits no discharge.  Neck: Normal range of motion. Neck supple.  Cardiovascular: Normal rate, regular rhythm, normal heart sounds and intact distal pulses.   Respiratory: Effort normal and breath sounds normal.  GI: Soft. Bowel sounds are normal. She exhibits no distension.  Musculoskeletal: Normal range of motion. She exhibits no edema.  Neurological: She is alert and oriented to person, place, and time. She has normal reflexes. She displays normal reflexes. No cranial nerve deficit. She exhibits normal muscle tone. Coordination normal.  Skin: Skin is warm and dry. She is not diaphoretic.  Psychiatric: She has a normal mood and affect.   MRI of brain personally reviewed by me and shows very small R temporal lobe DWI deficit, moderate  white matter changes  Assessment/Plan: 1.  Small possible R temporal lobe infarct-  This could be some other abnormality such as AVM or angioma as well with changes; unusual to have two DWI changes in the same area within 6 months  of each other. 2.  Suspect seizure-  This would explain symptoms after surgery and intermittent confusion as well;  I suspect that her nausea maybe epileptic as well -  CTA of head but will likely need cathater angiogram -  EEG on Monday -  Load Vimpat 162m IV then 54mBID -  Continue ASA and statin -  Keep Mg > 2, Ca > 8 and Na > 130 -  Will follow   Buck Mcaffee, MAMcFall0/01/2015, 4:08 PM

## 2015-03-30 NOTE — Progress Notes (Signed)
[place] [facility] [place] NAME: [name]    MR#:  [id number]  DATE OF BIRTH:  27-Mar-1932  SUBJECTIVE:  CHIEF COMPLAINT:  No chief complaint on file.  Feels better. Sitting up in a chair. Tolerated clear liquids. Poor appetite. Afebrile. No shortness of breath. No focal weakness. REVIEW OF SYSTEMS:    Review of Systems  Constitutional: Positive for malaise/fatigue. Negative for fever and chills.  HENT: Negative for sore throat.   Eyes: Negative for blurred vision, double vision and pain.  Respiratory: Negative for cough, hemoptysis, shortness of breath and wheezing.   Cardiovascular: Negative for chest pain, palpitations, orthopnea and leg swelling.  Gastrointestinal: Positive for nausea and abdominal pain. Negative for vomiting, diarrhea and constipation.  Genitourinary: Negative for dysuria and hematuria.  Musculoskeletal: Negative for back pain and joint pain.  Skin: Negative for rash.  Neurological: Positive for weakness. Negative for sensory change, speech change, focal weakness and headaches.  Endo/Heme/Allergies: Does not bruise/bleed easily.  Psychiatric/Behavioral: Negative for depression. The patient is not nervous/anxious.       DRUG ALLERGIES:   Allergies  Allergen Reactions  . Simvastatin     Other reaction(s): Muscle Pain    VITALS:  Blood pressure 141/55, pulse 72, temperature 97.9 F (36.6 C), temperature source Oral, resp. rate 17, height 5\' 1"  (1.549 m), weight 62.596 kg (138 lb), SpO2 94 %.  PHYSICAL EXAMINATION:   Physical Exam  GENERAL:  81 y.o.-year-old patient lying in the bed with no acute distress.  EYES: Pupils equal, round, reactive to light and accommodation. No scleral icterus. Extraocular muscles intact.  HEENT: Head atraumatic, normocephalic. Oropharynx and nasopharynx clear.  NECK:  Supple, no jugular venous distention. No thyroid enlargement, no tenderness.  LUNGS: Normal breath  sounds bilaterally, no wheezing, rales, rhonchi. No use of accessory muscles of respiration.  CARDIOVASCULAR: S1, S2 normal. No murmurs, rubs, or gallops.  ABDOMEN: Soft, tender. Bowel sounds decreased. EXTREMITIES: No cyanosis, clubbing or edema b/l.    NEUROLOGIC: Cranial nerves II through XII are intact. No focal Motor or sensory deficits b/l.   PSYCHIATRIC: The patient is alert and oriented x 3.  SKIN: No obvious rash, lesion, or ulcer.    LABORATORY PANEL:   CBC  Recent Labs Lab 03/29/15 0625  WBC 25.8*  HGB 10.0*  HCT 32.5*  PLT 706*   ------------------------------------------------------------------------------------------------------------------  Chemistries   Recent Labs Lab 03/29/15 0625  NA 137  K 4.3  CL 106  CO2 24  GLUCOSE 370*  BUN 18  CREATININE 0.98  CALCIUM 7.6*   ------------------------------------------------------------------------------------------------------------------  Cardiac Enzymes No results for input(s): TROPONINI in the last 168 hours. ------------------------------------------------------------------------------------------------------------------  RADIOLOGY:  Mr Brain Wo Contrast  03/29/2015   CLINICAL DATA:  TIA. Sudden onset of hot feeling and vomiting this morning. Fell 1 week ago and hit head. Small bowel resection yesterday.  EXAM: MRI HEAD WITHOUT CONTRAST  TECHNIQUE: Multiplanar, multiecho pulse sequences of the brain and surrounding structures were obtained without intravenous contrast.  COMPARISON:  12/21/2014  FINDINGS: Some sequences are mildly motion degraded.  There is a 5 mm oval focus of well-defined diffusion-weighted signal abnormality in the right temporal lobe suggestive of small acute infarct, however this is not confirmed on coronal diffusion weighted imaging and it is possible (although felt less likely) this could reflect artifact from the nearby petrous bone. There is suggestion of a small amount of FLAIR  hyperintensity in this location which would favor this being a  true acute infarct rather than artifact.  There is moderate generalized cerebral atrophy. A few foci of chronic microhemorrhage are again seen in the posterior right temporal lobe and left cerebellum. Patchy and confluent T2 hyperintensities in the subcortical and deep cerebral white matter bilaterally are similar to the prior MRI and compatible with moderate chronic small vessel ischemic disease.  Prior right cataract extraction is noted. No significant inflammatory disease is seen in the paranasal sinuses or mastoid air cells. Major intracranial vascular flow voids are preserved.  IMPRESSION: 1. 5 mm focus of abnormal diffusion signal in the right temporal lobe favored to reflect a small acute infarct over artifact. 2. Moderate chronic small vessel ischemic disease and cerebral atrophy.   Electronically Signed   By: Logan Bores M.D.   On: 03/29/2015 13:53     ASSESSMENT AND PLAN:   * Acute right temporal CVA Patient was off Plavix and aspirin for his surgery. Restarted on aspirin at this time. Has history of TIA. Requested neurology for consult. No neurological deficits at this time. Physical therapy. DVT prophylaxis.  * Diabetes mellitus with uncontrolled blood sugars Stop D5 in IV fluids. Change to half-normal saline with potassium. Patient is metformin at home. Poor oral intake at this time. Continue sliding scale insulin.  * Hypertension We will hold hydralazine and ramipril for permissive hypertension due to CVA.  * Right colectomy for small bowel obstruction - concerned regarding carcinoma of ileocecal junction Management per surgery  * Leukocytosis Will repeat labs in the morning Could be reactive from surgery. Afebrile.   All the records are reviewed and case discussed with Care Management/Social Workerr. Management plans discussed with the patient, family and they are in agreement.  CODE STATUS: Full  code  DVT Prophylaxis: SCDs and Lovenox  TOTAL TIME TAKING CARE OF THIS PATIENT: 35 minutes.    Hillary Bow R M.D on 03/30/2015 at 11:10 AM  Between 7am to 6pm - Pager - [phone]  After 6pm go to [email or web address] - password [email or web address] [facility]  Office  [phone]  CC: Primary care physician; Madelyn Brunner, MD   Note: This dictation was prepared with Dragon dictation along with smaller phrase technology. Any transcriptional errors that result from this process are unintentional.

## 2015-03-31 ENCOUNTER — Inpatient Hospital Stay: Payer: Medicare Other

## 2015-03-31 LAB — GLUCOSE, CAPILLARY
Glucose-Capillary: 143 mg/dL — ABNORMAL HIGH (ref 65–99)
Glucose-Capillary: 147 mg/dL — ABNORMAL HIGH (ref 65–99)
Glucose-Capillary: 120 mg/dL — ABNORMAL HIGH (ref 65–99)
Glucose-Capillary: 170 mg/dL — ABNORMAL HIGH (ref 65–99)
Glucose-Capillary: 174 mg/dL — ABNORMAL HIGH (ref 65–99)

## 2015-03-31 LAB — CBC WITH DIFFERENTIAL/PLATELET
Basophils Absolute: 0.2 10*3/uL — ABNORMAL HIGH (ref 0–0.1)
Basophils Relative: 1 %
Eosinophils Absolute: 0.2 10*3/uL (ref 0–0.7)
Eosinophils Relative: 1 %
HCT: 20.9 % — ABNORMAL LOW (ref 35.0–47.0)
Hemoglobin: 6.5 g/dL — ABNORMAL LOW (ref 12.0–16.0)
Lymphocytes Relative: 7 %
Lymphs Abs: 1 10*3/uL (ref 1.0–3.6)
MCH: 24.2 pg — ABNORMAL LOW (ref 26.0–34.0)
MCHC: 31.3 g/dL — ABNORMAL LOW (ref 32.0–36.0)
MCV: 77.5 fL — ABNORMAL LOW (ref 80.0–100.0)
Monocytes Absolute: 0.8 10*3/uL (ref 0.2–0.9)
Monocytes Relative: 6 %
Neutro Abs: 11.9 10*3/uL — ABNORMAL HIGH (ref 1.4–6.5)
Neutrophils Relative %: 85 %
Platelets: 481 10*3/uL — ABNORMAL HIGH (ref 150–440)
RBC: 2.69 MIL/uL — ABNORMAL LOW (ref 3.80–5.20)
RDW: 17.8 % — ABNORMAL HIGH (ref 11.5–14.5)
WBC: 14 10*3/uL — ABNORMAL HIGH (ref 3.6–11.0)

## 2015-03-31 LAB — ABO/RH: ABO/RH(D): A POS

## 2015-03-31 LAB — HEMOGLOBIN AND HEMATOCRIT, BLOOD
HCT: 29.5 % — ABNORMAL LOW (ref 35.0–47.0)
Hemoglobin: 9.7 g/dL — ABNORMAL LOW (ref 12.0–16.0)

## 2015-03-31 LAB — BASIC METABOLIC PANEL
Anion gap: 5 (ref 5–15)
BUN: 19 mg/dL (ref 6–20)
CO2: 26 mmol/L (ref 22–32)
Calcium: 7.3 mg/dL — ABNORMAL LOW (ref 8.9–10.3)
Chloride: 103 mmol/L (ref 101–111)
Creatinine, Ser: 0.88 mg/dL (ref 0.44–1.00)
GFR calc Af Amer: >60 mL/min (ref >60)
GFR calc non Af Amer: 60 mL/min — ABNORMAL LOW (ref >60)
Glucose, Bld: 154 mg/dL — ABNORMAL HIGH (ref 65–99)
Potassium: 4.6 mmol/L (ref 3.5–5.1)
Sodium: 134 mmol/L — ABNORMAL LOW (ref 135–145)

## 2015-03-31 LAB — PREPARE RBC (CROSSMATCH)

## 2015-03-31 MED ORDER — SODIUM CHLORIDE 0.45 % IV SOLN
INTRAVENOUS | Status: DC
  Administered 2015-03-31: 09:00:00 via INTRAVENOUS

## 2015-03-31 MED ORDER — PANTOPRAZOLE SODIUM 40 MG PO TBEC
40.0000 mg | DELAYED_RELEASE_TABLET | Freq: Two times a day (BID) | ORAL | Status: DC
  Administered 2015-03-31 – 2015-04-03 (×7): 40 mg via ORAL
  Filled 2015-03-31 (×7): qty 1

## 2015-03-31 MED ORDER — VERAPAMIL HCL ER 120 MG PO TBCR
120.0000 mg | EXTENDED_RELEASE_TABLET | Freq: Every day | ORAL | Status: DC
  Administered 2015-03-31 – 2015-04-02 (×3): 120 mg via ORAL
  Filled 2015-03-31 (×3): qty 1

## 2015-03-31 MED ORDER — SODIUM CHLORIDE 0.9 % IV SOLN
Freq: Once | INTRAVENOUS | Status: AC
  Administered 2015-03-31: 10:00:00 via INTRAVENOUS

## 2015-03-31 MED ORDER — TECHNETIUM TC 99M-LABELED RED BLOOD CELLS IV KIT
20.0000 | PACK | Freq: Once | INTRAVENOUS | Status: AC | PRN
  Administered 2015-03-31: 21.675 via INTRAVENOUS

## 2015-03-31 NOTE — Progress Notes (Signed)
AVSS. Feeling well. Nausea of yesterday resolved. Toleated clear liquids well. 2 bloody bowel movements this AM.  Lovenox discontinued. Will continue ASA secondary to TIA/CVA this spring. Lungs: Clear. Cardio: RR. ABD: Soft. Normal BS. Wound: Clean.  No further bleeding from inferior edge. Labs: Na improving. HGB down from 10.5 to 6.5.  WBC down from 24 K to 14K.  Discussed indication for transfusion. Patient amenable.  Clinically, patient looks very good.  Will advance diet and TX 2 units PRBC.

## 2015-03-31 NOTE — Progress Notes (Signed)
AVSS, BP and pulse stable. Excellent urine output. Bleeding scan reviewed: No evidence of active bleeding. Post TX HGB up from 6.5 to 9.7: Appropriate rise suggesting BM of noon and 5 PM was old blood. No history of ulcers. No UGI symptoms. Plan: PPI, F/U labs in AM. Change IV to saline lock as volume status appears good.

## 2015-03-31 NOTE — Progress Notes (Signed)
Patient noted to be bleeding from rectum after using the bathroom. Patient was not making a BM at this time she was only urinating. Amount of blood was around a half cup and its appearance was dark. Dr. Bary Castilla was notified and stated her labs would be drawn soon and that we would continue to monitor.

## 2015-03-31 NOTE — Progress Notes (Signed)
   03/31/15 1558  Clinical Encounter Type  Visited With Family  Visit Type Spiritual support  Referral From Nurse  Consult/Referral To Chaplain  Spiritual Encounters  Spiritual Needs Emotional  Stress Factors  Family Stress Factors Other (Comment)  Family members concerned about giving pt news of another family member passing, not sure if she is strong enough to get the news. Provided emotional support to family. Burley Saver [phone]

## 2015-03-31 NOTE — Progress Notes (Signed)
Notified of additional bloody stool\. ( X 3 since 3 AM). No change in VS. (Mild fall in BP w/ postural check earlier, no change in pulse, likely secondary to calcium channel blocker. No rise in BUN to suggest an upper GI source. Will add Protonix for stress prophylaxis. Complete planned TX and f/u HGB.

## 2015-03-31 NOTE — Progress Notes (Signed)
[facility] [place] NAME: [name]    MR#:  [id number]  DATE OF BIRTH:  23-Nov-1931  SUBJECTIVE:  CHIEF COMPLAINT:  No chief complaint on file.  3 bloody BMs today. No abd pain. Feels weak.   REVIEW OF SYSTEMS:    Review of Systems  Constitutional: Positive for malaise/fatigue. Negative for fever and chills.  HENT: Negative for sore throat.   Eyes: Negative for blurred vision, double vision and pain.  Respiratory: Negative for cough, hemoptysis, shortness of breath and wheezing.   Cardiovascular: Negative for chest pain, palpitations, orthopnea and leg swelling.  Gastrointestinal: Positive for nausea and abdominal pain. Negative for vomiting, diarrhea and constipation.  Genitourinary: Negative for dysuria and hematuria.  Musculoskeletal: Negative for back pain and joint pain.  Skin: Negative for rash.  Neurological: Positive for weakness. Negative for sensory change, speech change, focal weakness and headaches.  Endo/Heme/Allergies: Does not bruise/bleed easily.  Psychiatric/Behavioral: Negative for depression. The patient is not nervous/anxious.       DRUG ALLERGIES:   Allergies  Allergen Reactions  . Simvastatin     Other reaction(s): Muscle Pain    VITALS:  Blood pressure 146/53, pulse 82, temperature 98.1 F (36.7 C), temperature source Oral, resp. rate 17, height 5\' 1"  (1.549 m), weight 62.596 kg (138 lb), SpO2 93 %.  PHYSICAL EXAMINATION:   Physical Exam  GENERAL:  81 y.o.-year-old patient lying in the bed with no acute distress.  EYES: Pupils equal, round, reactive to light and accommodation. No scleral icterus. Extraocular muscles intact.  HEENT: Head atraumatic, normocephalic. Oropharynx and nasopharynx clear.  NECK:  Supple, no jugular venous distention. No thyroid enlargement, no tenderness.  LUNGS: Normal breath sounds bilaterally, no wheezing, rales, rhonchi. No use of accessory muscles of  respiration.  CARDIOVASCULAR: S1, S2 normal. No murmurs, rubs, or gallops.  ABDOMEN: Soft, tender. Bowel sounds decreased. EXTREMITIES: No cyanosis, clubbing or edema b/l.    NEUROLOGIC: Cranial nerves II through XII are intact. No focal Motor or sensory deficits b/l.   PSYCHIATRIC: The patient is alert and oriented x 3.  SKIN: No obvious rash, lesion, or ulcer.    LABORATORY PANEL:   CBC  Recent Labs Lab 03/31/15 0552  WBC 14.0*  HGB 6.5*  HCT 20.9*  PLT 481*   ------------------------------------------------------------------------------------------------------------------  Chemistries   Recent Labs Lab 03/30/15 1246 03/31/15 0552  NA 132* 134*  K 5.6* 4.6  CL 104 103  CO2 25 26  GLUCOSE 213* 154*  BUN 22* 19  CREATININE 0.95 0.88  CALCIUM 7.5* 7.3*  MG 1.8  --    ------------------------------------------------------------------------------------------------------------------  Cardiac Enzymes No results for input(s): TROPONINI in the last 168 hours. ------------------------------------------------------------------------------------------------------------------  RADIOLOGY:  Ct Angio Head W/cm &/or Wo Cm  03/30/2015   CLINICAL DATA:  Dizziness, headache, nausea, and vomiting. Recent abdominal surgery. Suspected tiny acute infarct in the right temporal lobe on recent MRI.  EXAM: CT ANGIOGRAPHY HEAD  TECHNIQUE: Multidetector CT imaging of the head was performed using the standard protocol during bolus administration of intravenous contrast. Multiplanar CT image reconstructions and MIPs were obtained to evaluate the vascular anatomy.  CONTRAST:  72mL OMNIPAQUE IOHEXOL 350 MG/ML SOLN  COMPARISON:  Brain MRI 03/29/2015.  Head MRA 12/21/2014.  FINDINGS: CT HEAD  Brain: Cerebral atrophy and moderate chronic small vessel ischemic disease are again seen. The punctate acute right temporal lobe infarct questioned on MRI is not well seen by CT. There is no evidence of  intracranial hemorrhage, mass, midline shift, or extra-axial fluid collection.  Calvarium and skull base: No skull fracture or destructive osseous lesion.  Paranasal sinuses: Mild right ethmoid air cell mucosal thickening.  Orbits: Prior right cataract extraction.  CTA HEAD  Anterior circulation: Internal carotid arteries are patent from skullbase to carotid termini. There is minimal carotid siphon calcification without stenosis. ACAs and MCAs are unremarkable. No intracranial aneurysm is identified.  Posterior circulation: The visualized distal vertebral arteries are patent without stenosis. Right vertebral artery is dominant. PICA origins are patent. Right AICA and bilateral SCA origins are patent. Basilar artery is patent without stenosis. Posterior communicating arteries are not clearly identified. PCAs are patent without significant stenosis.  Venous sinuses: Patent.  Anatomic variants: None of significance.  Delayed phase:No abnormal enhancement  IMPRESSION: No evidence of medium or large vessel occlusion or significant stenosis.   Electronically Signed   By: Logan Bores M.D.   On: 03/30/2015 20:07   Mr Brain Wo Contrast  03/29/2015   CLINICAL DATA:  TIA. Sudden onset of hot feeling and vomiting this morning. Fell 1 week ago and hit head. Small bowel resection yesterday.  EXAM: MRI HEAD WITHOUT CONTRAST  TECHNIQUE: Multiplanar, multiecho pulse sequences of the brain and surrounding structures were obtained without intravenous contrast.  COMPARISON:  12/21/2014  FINDINGS: Some sequences are mildly motion degraded.  There is a 5 mm oval focus of well-defined diffusion-weighted signal abnormality in the right temporal lobe suggestive of small acute infarct, however this is not confirmed on coronal diffusion weighted imaging and it is possible (although felt less likely) this could reflect artifact from the nearby petrous bone. There is suggestion of a small amount of FLAIR hyperintensity in this location  which would favor this being a true acute infarct rather than artifact.  There is moderate generalized cerebral atrophy. A few foci of chronic microhemorrhage are again seen in the posterior right temporal lobe and left cerebellum. Patchy and confluent T2 hyperintensities in the subcortical and deep cerebral white matter bilaterally are similar to the prior MRI and compatible with moderate chronic small vessel ischemic disease.  Prior right cataract extraction is noted. No significant inflammatory disease is seen in the paranasal sinuses or mastoid air cells. Major intracranial vascular flow voids are preserved.  IMPRESSION: 1. 5 mm focus of abnormal diffusion signal in the right temporal lobe favored to reflect a small acute infarct over artifact. 2. Moderate chronic small vessel ischemic disease and cerebral atrophy.   Electronically Signed   By: Logan Bores M.D.   On: 03/29/2015 13:53   US Carotid Bilateral  03/30/2015   CLINICAL DATA:  81 year old with small acute right temporal lobe stroke identified on MRI earlier today. Screen  EXAM: BILATERAL CAROTID DUPLEX ULTRASOUND  TECHNIQUE: Pearline Cables scale imaging, color Doppler and duplex ultrasound were performed of bilateral carotid and vertebral arteries in the neck.  COMPARISON:  12/21/2014.  FINDINGS: Criteria: Quantification of carotid stenosis is based on velocity parameters that correlate the residual internal carotid diameter with NASCET-based stenosis levels, using the diameter of the distal internal carotid lumen as the denominator for stenosis measurement.  The following velocity measurements were obtained:  RIGHT  ICA:  106/23 cm/sec  CCA:  253/66 cm/sec  SYSTOLIC ICA/CCA RATIO:  0.9  DIASTOLIC ICA/CCA RATIO:  1.5  ECA:  86/6 cm/sec  LEFT  ICA:  88/18 cm/sec  CCA:  440/34 cm/sec  SYSTOLIC ICA/CCA RATIO:  0.9  DIASTOLIC ICA/CCA RATIO:  0.9  ECA:  66/3 cm/sec  RIGHT CAROTID ARTERY: Minimal to mild noncalcified plaque involving the CCA and ICA, most apparent  in the carotid bulb. No evidence of stenosis of greater than 50% diameter at grayscale or color imaging.  RIGHT VERTEBRAL ARTERY:  Antegrade flow.  LEFT CAROTID ARTERY: Minimal-to-mild noncalcified plaque involving the CCA an ICA, most apparent in the carotid bulb. No evidence of stenosis of greater than 50% diameter at grayscale or color imaging.  LEFT VERTEBRAL ARTERY:  Antegrade flow.  IMPRESSION: 1. No evidence of hemodynamically significant stenosis involving either the right or left carotid circulation in the neck by Doppler criteria. 2. Antegrade flow in both vertebral arteries.   Electronically Signed   By: Evangeline Dakin M.D.   On: 03/30/2015 20:07     ASSESSMENT AND PLAN:   * BRBPR Significant blood loss. Vitals seem stable. Getting 2 units transfused. Monitor HB. Hold lovenox and aspirin  * Acute right temporal CVA Patient was off Plavix and aspirin for his surgery. Aspirin held today due to GI bleed Has history of TIA. CTA head  No neurological deficits at this time. Physical therapy.  * Diabetes mellitus with uncontrolled blood sugars Stop D5 in IV fluids. Change to half-normal saline with potassium. Patient is metformin at home. Poor oral intake at this time. Continue sliding scale insulin.  * Hypertension We will hold hydralazine and ramipril for permissive hypertension due to CVA.  * Right colectomy for small bowel obstruction - concerned regarding carcinoma of ileocecal junction Management per surgery  * Leukocytosis Improving   All the records are reviewed and case discussed with Care Management/Social Workerr. Management plans discussed with the patient, family and they are in agreement.  CODE STATUS: Full code  DVT Prophylaxis: SCDs and Lovenox  TOTAL TIME TAKING CARE OF THIS PATIENT: 35 minutes.    Hillary Bow R M.D on 03/31/2015 at 12:57 PM  Between 7am to 6pm - Pager - [phone]  After 6pm go to [email or web address] - password [email or web address] [facility]  Office  [phone]  CC: Primary care physician; Madelyn Brunner, MD   Note: This dictation was prepared with Dragon dictation along with smaller phrase technology. Any transcriptional errors that result from this process are unintentional.

## 2015-03-31 NOTE — Progress Notes (Signed)
Dr.Byrnett notified of medium bloody stool. MD satisfied with vs while transfusing. Will continue to monitor and revaluate after hgb.

## 2015-03-31 NOTE — Progress Notes (Signed)
NEUROLOGY NOTE  S: Pt had to get a few transfusions today but nausea/vomiting is gone.  She never felt weak today and feels good.  ROS neg x 8 systems except for BRBPR  O: 98.6    152/71    86   18 No distress, nl weight Normocephalic, oropharynx clear Supple, no JVD CTA B, no wheezing RRR, no murmur No C/C/E  Sleepy but oriented x 3, nl speech and language PERRLA, EOMI, face symmetric 5/5 B, nl tone  CTA of head personally reviewed by me and does not show any abnormalities of the vessels A/P: 1. Small possible R temporal lobe infarct- stable, This could be some other abnormality such as AVM or angioma as well with changes; unusual to have two DWI changes in the same area within 6 months of each other. 2. Suspect seizure- No further episodes today and nausea improved too which is likely epileptic - EEG on Monday - contine Vimpat 50mg  BID - Continue ASA and statin - Keep Mg > 2, Ca > 8 and Na > 130 - Will sign off, please call with questions -  Needs to f/u with [facility] in 3-4 weeks

## 2015-04-01 ENCOUNTER — Inpatient Hospital Stay: Payer: Medicare Other

## 2015-04-01 LAB — BASIC METABOLIC PANEL
Anion gap: 6 (ref 5–15)
BUN: 15 mg/dL (ref 6–20)
CO2: 25 mmol/L (ref 22–32)
Calcium: 7.5 mg/dL — ABNORMAL LOW (ref 8.9–10.3)
Chloride: 105 mmol/L (ref 101–111)
Creatinine, Ser: 0.74 mg/dL (ref 0.44–1.00)
GFR calc Af Amer: >60 mL/min (ref >60)
GFR calc non Af Amer: >60 mL/min (ref >60)
Glucose, Bld: 145 mg/dL — ABNORMAL HIGH (ref 65–99)
Potassium: 3.9 mmol/L (ref 3.5–5.1)
Sodium: 136 mmol/L (ref 135–145)

## 2015-04-01 LAB — CBC WITH DIFFERENTIAL/PLATELET
Basophils Absolute: 0.1 10*3/uL (ref 0–0.1)
Basophils Relative: 1 %
Eosinophils Absolute: 0.6 10*3/uL (ref 0–0.7)
Eosinophils Relative: 4 %
HCT: 28.4 % — ABNORMAL LOW (ref 35.0–47.0)
Hemoglobin: 9.3 g/dL — ABNORMAL LOW (ref 12.0–16.0)
Lymphocytes Relative: 8 %
Lymphs Abs: 1 10*3/uL (ref 1.0–3.6)
MCH: 26.2 pg (ref 26.0–34.0)
MCHC: 32.8 g/dL (ref 32.0–36.0)
MCV: 79.8 fL — ABNORMAL LOW (ref 80.0–100.0)
Monocytes Absolute: 0.9 10*3/uL (ref 0.2–0.9)
Monocytes Relative: 8 %
Neutro Abs: 10 10*3/uL — ABNORMAL HIGH (ref 1.4–6.5)
Neutrophils Relative %: 79 %
Platelets: 477 10*3/uL — ABNORMAL HIGH (ref 150–440)
RBC: 3.56 MIL/uL — ABNORMAL LOW (ref 3.80–5.20)
RDW: 17.8 % — ABNORMAL HIGH (ref 11.5–14.5)
WBC: 12.6 10*3/uL — ABNORMAL HIGH (ref 3.6–11.0)

## 2015-04-01 LAB — GLUCOSE, CAPILLARY
Glucose-Capillary: 147 mg/dL — ABNORMAL HIGH (ref 65–99)
Glucose-Capillary: 184 mg/dL — ABNORMAL HIGH (ref 65–99)
Glucose-Capillary: 194 mg/dL — ABNORMAL HIGH (ref 65–99)
Glucose-Capillary: 124 mg/dL — ABNORMAL HIGH (ref 65–99)
Glucose-Capillary: 169 mg/dL — ABNORMAL HIGH (ref 65–99)

## 2015-04-01 MED ORDER — RAMIPRIL 10 MG PO CAPS
10.0000 mg | ORAL_CAPSULE | Freq: Two times a day (BID) | ORAL | Status: DC
  Administered 2015-04-01 – 2015-04-03 (×5): 10 mg via ORAL
  Filled 2015-04-01 (×5): qty 1

## 2015-04-01 MED ORDER — ALPRAZOLAM 0.25 MG PO TABS: 0.2500 mg | ORAL_TABLET | Freq: Three times a day (TID) | ORAL | Status: DC | PRN

## 2015-04-01 NOTE — Care Management (Signed)
Informed by CSW that patient will now be discharging home. Does not meed criteria for SNF.STR.  PT recommends Home Health. More to follow.Marland Kitchen

## 2015-04-01 NOTE — Care Management Important Message (Signed)
Important Message  Patient Details  Name: [name] MRN: [id number] Date of Birth: Feb 19, 1932   Medicare Important Message Given:  Yes-third notification given    Alvie Heidelberg, RN 04/01/2015, 1:06 PM

## 2015-04-01 NOTE — Progress Notes (Signed)
   04/01/15 1300  Clinical Encounter Type  Visited With Patient;Family  Visit Type Initial  Referral From Nurse  Spiritual Encounters  Spiritual Needs Prayer  Stress Factors  Patient Stress Factors Loss  Family Stress Factors Loss  Patient received news about her loved one that died. Patient grieved the loss and chaplain offered prayers and support.

## 2015-04-01 NOTE — Progress Notes (Signed)
Per Dr. Tamala Julian okay to place order for PT evaluation

## 2015-04-01 NOTE — Clinical Social Work Note (Signed)
Patient does not qualify for STR at this time as she ambulated 442ft with PT. CSW has updated patient and her family. Patient verbalized understanding. RN CM to see patient for home health arrangements. Patient mentioned that she may call [facility] to assist her at home as well. Shela Leff MSW,LCSW [phone]

## 2015-04-01 NOTE — Clinical Social Work Note (Signed)
Clinical Social Work Assessment  Patient Details  Name: [name] MRN: [id number] Date of Birth: [birth year]  Date of referral:  04/01/15               Reason for consult:  Facility Placement                Permission sought to share information with:  Facility Art therapist granted to share information::  Yes, Verbal Permission Granted  Name::        Agency::     Relationship::     Contact Information:     Housing/Transportation Living arrangements for the past 2 months:  Single Family Home Source of Information:  Patient Patient Interpreter Needed:  None Criminal Activity/Legal Involvement Pertinent to Current Situation/Hospitalization:  No - Comment as needed Significant Relationships:  [employer], [employer], Friend Lives with:  Self Do you feel safe going back to the place where you live?  Yes Need for family participation in patient care:  No (Coment)  Care giving concerns:  Patient lives alone   Facilities manager / plan:  Patient is requesting short term rehab in [place] status post right colectomy. CSW has requested PT consult to assess patient's mobility as patient has [facility] and would need to be approved to go to [place] by [facility]. CSW informed by nursing staff in progression rounds that patient's brother passed away yesterday but that patient does not know this yet.   CSW met with patient this afternoon as she was eating her lunch. Patient was very pleasant and stated that she feels if she can go to a rehab facility for about 5 days, she would be in good condition to return home. Patient states her husband passed away in 2022/09/27 of this year and that he was in [place] and she would like to go there. She states it is near her friends and they could visit easier. Bedsearch initiated.  Employment status:  Retired Nurse, adult PT Recommendations:  Not assessed at this time Information / Referral to community resources:   [place]  Patient/Family's Response to care:  Patient expressed appreciation for CSW visit and assistance.  Patient/Family's Understanding of and Emotional Response to Diagnosis, Current Treatment, and Prognosis:  Patient seems to be dealing with her hospitalization well. It is unforeseen how she will react to the impending news of her brother's death. CSW will provide support and assistance.  Emotional Assessment Appearance:  Appears stated age Attitude/Demeanor/Rapport:   (pleasant and cooperative) Affect (typically observed):  Accepting, Appropriate Orientation:  Oriented to Self, Oriented to Place, Oriented to  Time, Oriented to Situation Alcohol / Substance use:  Not Applicable Psych involvement (Current and /or in the community):  No (Comment)  Discharge Needs  Concerns to be addressed:  Care Coordination Readmission within the last 30 days:  No Current discharge risk:  None Barriers to Discharge:  No Barriers Identified   Shela Leff, LCSW 04/01/2015, 12:35 PM

## 2015-04-01 NOTE — Evaluation (Signed)
Physical Therapy Evaluation Patient Details Name: [name] MRN: [id number] DOB: 04-26-1932 Today's Date: 04/01/2015   History of Present Illness  admitted for acute hospitalization status post R colectomy secondary to SBO (likely colon CA).  Hospital course complicated by syncopal episode and subsequent acure R temporal CVA.  Clinical Impression  Upon evaluation, patient alert and oriented; follows all commands and demonstrates good safety awareness/insight.  Bilat UE/LE strength and ROM grossly WFL; no focal weakness, no sensory deficit, no tonal or coordination changes noted.  Able to complete bed mobility indep; sit/stand and basic transfers without assist device, cga/mod indep; gait (x400') without assist device, cga.  Mildly antalgic to L (due to residual weakness from prior TIA), but no overt LOB.  Mild higher-level balance deficits as noted by modified DGI (9/10), but with good awareness of limitations and use of compensatory strategies as needed.  Fair/good cadence/gait speed (10' walk time, 6 seconds) without overt buckling or LOB. Would benefit from skilled PT to address above deficits and promote optimal return to PLOF; Recommend transition to [place] upon discharge from acute hospitalization.   Follow Up Recommendations Home health PT    Equipment Recommendations       Recommendations for Other Services       Precautions / Restrictions Precautions Precautions: Fall Restrictions Weight Bearing Restrictions: No      Mobility  Bed Mobility Overal bed mobility: Modified Independent                Transfers Overall transfer level: Modified independent Equipment used: None             General transfer comment: minimal use of bilat UEs to complete  Ambulation/Gait Ambulation/Gait assistance: Min guard Ambulation Distance (Feet): 400 Feet Assistive device: None   Gait velocity: 10' walk time, 6 seconds   General Gait Details: reciprocal stepping pattern  with good step height/length; mildly antalgic on L (residual weakness from prior TIA).  Steady cadence and gait speed withut overt LOB  Stairs            Wheelchair Mobility    Modified Rankin (Stroke Patients Only)       Balance Overall balance assessment: Needs assistance Sitting-balance support: No upper extremity supported;Feet supported Sitting balance-Leahy Scale: Good     Standing balance support: No upper extremity supported Standing balance-Leahy Scale: Fair Standing balance comment: standing functional reach >6"                 Standardized Balance Assessment Standardized Balance Assessment :  (Modified DGI: 9/12 (normal speed 3, changing speed 2, horizontal and vertical head turns 2); indicative of mild increase in fall risk due to higher level balance deficits)           Pertinent Vitals/Pain Pain Assessment: No/denies pain    Home Living Family/patient expects to be discharged to:: Private residence Living Arrangements: Alone Available Help at Discharge: Family Type of Home: House Home Access: Stairs to enter Entrance Stairs-Rails: Right;Left (unable to reach both) Entrance Stairs-Number of Steps: 2 Home Layout: One level Home Equipment: Cane - single point      Prior Function Level of Independence: Independent         Comments: Indep with household and community mobility; indep with ADLs; + driving.  Does endorse single fall in previous six months     Hand Dominance        Extremity/Trunk Assessment   Upper Extremity Assessment: Overall WFL for tasks assessed  Lower Extremity Assessment: Overall WFL for tasks assessed         Communication   Communication: No difficulties  Cognition Arousal/Alertness: Awake/alert Behavior During Therapy: WFL for tasks assessed/performed Overall Cognitive Status: Within Functional Limits for tasks assessed                      General Comments General comments  (skin integrity, edema, etc.): abdominal incision with staples; clean and dry    Exercises        Assessment/Plan    PT Assessment Patient needs continued PT services  PT Diagnosis Difficulty walking;Generalized weakness   PT Problem List Decreased activity tolerance;Decreased balance;Decreased mobility  PT Treatment Interventions DME instruction;Gait training;Functional mobility training;Stair training;Therapeutic activities;Therapeutic exercise;Balance training;Patient/family education   PT Goals (Current goals can be found in the Care Plan section) Acute Rehab PT Goals Patient Stated Goal: "to get a little stronger and be able to go to my brother's funeral" PT Goal Formulation: With patient Time For Goal Achievement: 04/15/15 Potential to Achieve Goals: Good    Frequency Min 2X/week   Barriers to discharge Decreased caregiver support      Co-evaluation               End of Session Equipment Utilized During Treatment: Gait belt Activity Tolerance: Patient tolerated treatment well Patient left: in bed;with call bell/phone within reach;with bed alarm set;with family/visitor present           Time: 1419-1436 PT Time Calculation (min) (ACUTE ONLY): 17 min   Charges:   PT Evaluation $Initial PT Evaluation Tier I: 1 Procedure PT Treatments $Therapeutic Activity: 8-22 mins   PT G Codes:       Zannie Runkle H. Owens Shark, PT, DPT, NCS 04/01/2015, 3:09 PM [phone]

## 2015-04-01 NOTE — Progress Notes (Signed)
I reviewed her history of rectal bleeding over the weekend.  GI bleeding study was negative.  Her Lovenox was stopped.  She is now a eating satisfactorily.  She reports no abdominal pain.  She has walked in the hallway  One time yesterday.  Vital signs are stable.  Abdomen is soft with no significant tenderness.  Lower abdominal midline incision is healing satisfactorily.  Hemoglobin this morning 9.3  Impression rectal bleeding likely was related to the anastomosis and treatment with Lovenox and aspirin.  It appears that bleeding has resolved.  Patient has for rehab bed at [facility] will consult social services.  I encouraged frequent walking today.  Will recheck hemoglobin tomorrow.

## 2015-04-02 LAB — TYPE AND SCREEN
ABO/RH(D): A POS
Antibody Screen: NEGATIVE
Unit division: 0
Unit division: 0
Unit division: 0
Unit division: 0

## 2015-04-02 LAB — GLUCOSE, CAPILLARY
Glucose-Capillary: 188 mg/dL — ABNORMAL HIGH (ref 65–99)
Glucose-Capillary: 149 mg/dL — ABNORMAL HIGH (ref 65–99)
Glucose-Capillary: 143 mg/dL — ABNORMAL HIGH (ref 65–99)
Glucose-Capillary: 217 mg/dL — ABNORMAL HIGH (ref 65–99)

## 2015-04-02 LAB — HEMOGLOBIN AND HEMATOCRIT, BLOOD
HCT: 27.5 % — ABNORMAL LOW (ref 35.0–47.0)
Hemoglobin: 9 g/dL — ABNORMAL LOW (ref 12.0–16.0)

## 2015-04-02 LAB — SURGICAL PATHOLOGY

## 2015-04-02 NOTE — Plan of Care (Signed)
Problem: Progression Outcomes Goal: Tolerating diet/TF at goal rate Outcome: Completed/Met Date Met:  04/02/15 Tolerating soft diet

## 2015-04-02 NOTE — Care Management Note (Signed)
Case Management Note  Patient Details  Name: [name] MRN: [id number] Date of Birth: 16-Nov-1931  Subjective/Objective:                    Action/Plan:  Spoke with patient for discharge plan. Patient is from home with good support network, lives alone.  Patient is ambulatory and uses a cane at home. No other DME.  [place] with [place]. Referral placed with Floydene Flock. Patient also interested in self pay home agency information.  Gave list of area providers for in home assistance.     Expected Discharge Date:                  Expected Discharge Plan:  [place]  In-House Referral:     Discharge planning Services  CM Consult  Post Acute Care Choice:  NA Choice offered to:  Patient  DME Arranged:  N/A DME Agency:     HH Arranged:  RN, PT, Nurse's Aide [place] Agency:  [place]  Status of Service:  Completed, signed off  Medicare Important Message Given:  Yes-third notification given Date Medicare IM Given:    Medicare IM give by:    Date Additional Medicare IM Given:    Additional Medicare Important Message give by:     If discussed at [place] of Stay Meetings, dates discussed:    Additional Comments:  Alvie Heidelberg, RN 04/02/2015, 11:47 AM

## 2015-04-02 NOTE — Progress Notes (Signed)
Physical Therapy Treatment Patient Details Name: [name] MRN: [id number] DOB: 12/21/1931 Today's Date: 04/02/2015    History of Present Illness admitted for acute hospitalization status post R colectomy secondary to SBO (likely colon CA).  Hospital course complicated by syncopal episode and subsequent acure R temporal CVA.    PT Comments    Pt demonstrates ambulation and negotiation of stairs without assistive device or LOB . Pt participates in higher level balance exercises/activiity with minor challenge. Education provided to patient/family regarding home exercises as well as community opportunities for continued exercise for strengthening and balance. Continue PT for improved functional mobility to supervision level to allow return to prior level of function.   Follow Up Recommendations  Home health PT     Equipment Recommendations       Recommendations for Other Services       Precautions / Restrictions Restrictions Weight Bearing Restrictions: No    Mobility  Bed Mobility Overal bed mobility: Modified Independent             General bed mobility comments: minimal use of rails  Transfers Overall transfer level: Independent Equipment used: None             General transfer comment: minimal use of bilat UEs to complete  Ambulation/Gait Ambulation/Gait assistance: Min guard Ambulation Distance (Feet): 280 Feet Assistive device: None       General Gait Details: Able to tolerate ambulation with directional head changes 4 ways without LOB.    Stairs Stairs: Yes Stairs assistance: Min guard Stair Management: One rail Right;One rail Left (2 hands on 1 rails R ascending; L descending) Number of Stairs: 7 General stair comments: step to pattern, 2 hands on one rail  Wheelchair Mobility    Modified Rankin (Stroke Patients Only)       Balance   Sitting-balance support: No upper extremity supported Sitting balance-Leahy Scale: Normal      Standing balance support: No upper extremity supported Standing balance-Leahy Scale: Good Standing balance comment: able to demonstrate tandem stance R and L, feet together eyes open/closed and high knee march and heel raises with light one hand touch on rail.                    Cognition Arousal/Alertness: Awake/alert Behavior During Therapy: WFL for tasks assessed/performed Overall Cognitive Status: Within Functional Limits for tasks assessed                      Exercises Other Exercises Other Exercises: Stand balance exercises including stand LE hip exercises in all plances, tandem stance, high march, feet together eyes open/closed and stand heel raise 10-20x    General Comments        Pertinent Vitals/Pain Pain Assessment: No/denies pain    Home Living                      Prior Function            PT Goals (current goals can now be found in the care plan section) Progress towards PT goals: Progressing toward goals    Frequency  Min 2X/week    PT Plan Current plan remains appropriate    Co-evaluation             End of Session Equipment Utilized During Treatment: Gait belt Activity Tolerance: Patient tolerated treatment well Patient left: in bed;with call bell/phone within reach;with family/visitor present (did not wish alarm, but agrees to call  for assist to get up)     Time: 1459-1532 PT Time Calculation (min) (ACUTE ONLY): 33 min  Charges:  $Gait Training: 8-22 mins $Neuromuscular Re-education: 8-22 mins                    G Codes:      Charlaine Dalton 04/02/2015, 3:47 PM

## 2015-04-02 NOTE — Progress Notes (Signed)
She reports good progress today. She is eating satisfactorily and moving her bowels satisfactorily. She is having no abdominal pain. She has been walking in the hallway.  On examination she is awake alert and oriented. Abdomen is soft and nontender. Her midline incision appears to be healing satisfactorily  Pathology demonstrated carcinoid tumor involving 9 out of 12 lymph nodes. I have reviewed this with the patient  Diagnosis carcinoid tumor of the terminal ileum  Anticipate discharge tomorrow. We'll follow-up in the office for removal of skin clips and schedule oncology consultation as an outpatient.

## 2015-04-03 DIAGNOSIS — I6330 Cerebral infarction due to thrombosis of unspecified cerebral artery: Secondary | ICD-10-CM | POA: Insufficient documentation

## 2015-04-03 LAB — GLUCOSE, CAPILLARY
Glucose-Capillary: 147 mg/dL — ABNORMAL HIGH (ref 65–99)
Glucose-Capillary: 171 mg/dL — ABNORMAL HIGH (ref 65–99)

## 2015-04-03 NOTE — Progress Notes (Signed)
Patient A&O, VSS.  No complaints of pain.  Tolerating diet well with no complaints of nausea.  Incision well approximated.  Discharge instructions reviewed with patient and niece.  All questions were answered and understanding was verbalized.  Patient discharged home in stable condition via wheelchair escorted by nursing staff.

## 2015-04-03 NOTE — Progress Notes (Signed)
Patient is refusing bed/chair alarm.  Ambulating independently; steady on feet.  Will continue to monitor.

## 2015-04-03 NOTE — Discharge Instructions (Addendum)
Take Tylenol if needed for pain. May shower. Avoid straining and heavy lifting. Take diabetic diet Follow-up with Dr. Tamala Julian in the office next week. Schedule follow-up appointment in the neurology Department at the [facility].   Small Bowel Resection, Care After Refer to this sheet in the next few weeks. These instructions provide you with information on caring for yourself after your procedure. Your health care provider may also give you more specific instructions. Your treatment has been planned according to current medical practices, but problems sometimes occur. Call your health care provider if you have any problems or questions after your procedure. WHAT TO EXPECT AFTER THE PROCEDURE After your procedure, it is typical to have the following:  Pain in your abdomen, especially along your incision. You will be given pain medicines to control this.  Tiredness during your recovery.  Constipation. You may be given a stool softener to help prevent this. HOME CARE INSTRUCTIONS  Only take over-the-counter or prescription medicines as directed by your health care provider. Take prescribed medicines exactly as directed.  Remove or change any bandages (dressings) only as directed by your health care provider.  It is okay to take showers 24-48 hours after surgery. Do not take baths or use swimming pools or hot tubs for 2 weeks or until your health care provider approves.  Continue your normal diet as directed by your health care provider. Eat plenty of fruits and vegetables to help prevent constipation.  Drink enough fluids to keep your urine clear or pale yellow. This also helps prevent constipation.  You will probably be able to go back to your normal routine after a few days. Do not do anything that requires extra effort until your health care provider says it is okay. Do not lift anything heavier than 15 pounds (6.8 kg) until your health care provider approves.  Walk but take  frequent rest breaks if you tire easily. You should gradually return to your normal level of activity over the next month.  Continue to practice deep breathing and coughing. If it hurts to cough, try holding a pillow against your abdomen as you cough.  Ask your health care provider about when it is safe to drive, have sex, or go back to work.  Follow up with your health care provider as directed. Ask your health care provider when you need to return to have your stitches or staples taken out. SEEK MEDICAL CARE IF:  You have pain that is not relieved with medicine.  You do not feel like eating.  You feel sick to your stomach (nauseous), or you vomit.  You have constipation that is not relieved with prescribed stool softeners. SEEK IMMEDIATE MEDICAL CARE IF:  Your pain gets worse, even after taking pain medicine.  Your wound becomes red or swollen, or you see fluid leaking from the incision.  Your legs or arms hurt or become red or swollen.  You have chest pain.  You have trouble breathing.  You have a fever.   This information is not intended to replace advice given to you by your health care provider. Make sure you discuss any questions you have with your health care provider.   Document Released: 11/10/2010 Document Revised: 03/29/2013 Document Reviewed: 01/18/2013 Elsevier Interactive Patient Education [employer].

## 2015-04-03 NOTE — Care Management Important Message (Signed)
Important Message  Patient Details  Name: [name] MRN: [id number] Date of Birth: 1931-08-17   Medicare Important Message Given:  Yes-third notification given    Alvie Heidelberg, RN 04/03/2015, 11:13 AM

## 2015-04-08 NOTE — Discharge Summary (Signed)
Discharge summary  He she was admitted to the hospital on 03/28/2015 with diagnosis of partial small bowel obstruction and CT evidence of small bowel tumor.  She was brought in through the outpatient surgery department and carried to the operating room were she had a ileocolectomy.  She had operative and pathologic findings of carcinoid tumor involving 9/12 lymph nodes.    Postoperatively she did have a neurologic event in that she had some momentary lethargy.  She had internal medicine consultation.  She was studied with MRI which demonstrated a 5 mm area in the right temporal lobe suggestive of an infarction.  Her symptoms quickly resolved.  She did have neurology consultation at and was told she likely had a seizure   She was given IV fluids.  She was also started on a clear liquid diet and later advanced to full liquids and then to solid food.  She demonstrated satisfactory bowel function.  Her wound progress satisfactorily.  She gradually increased her walking in the hallway.    Past medical history.  She has had a transient ischemic attack in June.  For that reason she was taking Plavix and aspirin.  Other past history is recorded on the typed history and physical.    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    Plans were to return home were she will have family members to help care for her   Will resume her Plavix and aspirin and usual medicines follow-up in the office, anticipate oncology consultation, anticipate neurology follow-up

## 2015-04-09 ENCOUNTER — Other Ambulatory Visit: Payer: Self-pay

## 2015-04-09 DIAGNOSIS — C182 Malignant neoplasm of ascending colon: Secondary | ICD-10-CM

## 2015-04-11 ENCOUNTER — Encounter: Payer: Self-pay | Admitting: Hematology and Oncology

## 2015-04-11 ENCOUNTER — Other Ambulatory Visit: Payer: Self-pay

## 2015-04-11 ENCOUNTER — Inpatient Hospital Stay (HOSPITAL_BASED_OUTPATIENT_CLINIC_OR_DEPARTMENT_OTHER): Payer: Medicare Other | Admitting: Hematology and Oncology

## 2015-04-11 ENCOUNTER — Inpatient Hospital Stay: Payer: Medicare Other | Attending: Hematology and Oncology

## 2015-04-11 VITALS — BP 146/71 | HR 66 | Temp 98.0°F | Ht 61.0 in | Wt 144.4 lb

## 2015-04-11 DIAGNOSIS — Z87891 Personal history of nicotine dependence: Secondary | ICD-10-CM | POA: Diagnosis not present

## 2015-04-11 DIAGNOSIS — C182 Malignant neoplasm of ascending colon: Secondary | ICD-10-CM

## 2015-04-11 DIAGNOSIS — Z853 Personal history of malignant neoplasm of breast: Secondary | ICD-10-CM

## 2015-04-11 DIAGNOSIS — C7A00 Malignant carcinoid tumor of unspecified site: Secondary | ICD-10-CM

## 2015-04-11 DIAGNOSIS — Z79899 Other long term (current) drug therapy: Secondary | ICD-10-CM | POA: Insufficient documentation

## 2015-04-11 DIAGNOSIS — Z803 Family history of malignant neoplasm of breast: Secondary | ICD-10-CM | POA: Diagnosis not present

## 2015-04-11 DIAGNOSIS — I10 Essential (primary) hypertension: Secondary | ICD-10-CM | POA: Diagnosis not present

## 2015-04-11 DIAGNOSIS — D509 Iron deficiency anemia, unspecified: Secondary | ICD-10-CM | POA: Diagnosis not present

## 2015-04-11 DIAGNOSIS — D45 Polycythemia vera: Secondary | ICD-10-CM | POA: Diagnosis not present

## 2015-04-11 DIAGNOSIS — E119 Type 2 diabetes mellitus without complications: Secondary | ICD-10-CM | POA: Diagnosis not present

## 2015-04-11 DIAGNOSIS — Z8673 Personal history of transient ischemic attack (TIA), and cerebral infarction without residual deficits: Secondary | ICD-10-CM

## 2015-04-11 DIAGNOSIS — C779 Secondary and unspecified malignant neoplasm of lymph node, unspecified: Secondary | ICD-10-CM | POA: Insufficient documentation

## 2015-04-11 DIAGNOSIS — C7A010 Malignant carcinoid tumor of the duodenum: Secondary | ICD-10-CM

## 2015-04-11 DIAGNOSIS — Z7984 Long term (current) use of oral hypoglycemic drugs: Secondary | ICD-10-CM

## 2015-04-11 DIAGNOSIS — Z7982 Long term (current) use of aspirin: Secondary | ICD-10-CM

## 2015-04-11 DIAGNOSIS — Z8542 Personal history of malignant neoplasm of other parts of uterus: Secondary | ICD-10-CM

## 2015-04-11 DIAGNOSIS — Z8781 Personal history of (healed) traumatic fracture: Secondary | ICD-10-CM | POA: Diagnosis not present

## 2015-04-11 DIAGNOSIS — R5383 Other fatigue: Secondary | ICD-10-CM

## 2015-04-11 DIAGNOSIS — Z85828 Personal history of other malignant neoplasm of skin: Secondary | ICD-10-CM | POA: Insufficient documentation

## 2015-04-11 DIAGNOSIS — Z85038 Personal history of other malignant neoplasm of large intestine: Secondary | ICD-10-CM | POA: Diagnosis not present

## 2015-04-11 DIAGNOSIS — E790 Hyperuricemia without signs of inflammatory arthritis and tophaceous disease: Secondary | ICD-10-CM | POA: Diagnosis not present

## 2015-04-11 LAB — CBC WITH DIFFERENTIAL/PLATELET
Basophils Absolute: 0.2 10*3/uL — ABNORMAL HIGH (ref 0–0.1)
Basophils Relative: 3 %
Eosinophils Absolute: 0.3 10*3/uL (ref 0–0.7)
Eosinophils Relative: 4 %
HCT: 31 % — ABNORMAL LOW (ref 35.0–47.0)
Hemoglobin: 9.8 g/dL — ABNORMAL LOW (ref 12.0–16.0)
Lymphocytes Relative: 12 %
Lymphs Abs: 0.8 10*3/uL — ABNORMAL LOW (ref 1.0–3.6)
MCH: 25.6 pg — ABNORMAL LOW (ref 26.0–34.0)
MCHC: 31.6 g/dL — ABNORMAL LOW (ref 32.0–36.0)
MCV: 80.8 fL (ref 80.0–100.0)
Monocytes Absolute: 0.5 10*3/uL (ref 0.2–0.9)
Monocytes Relative: 7 %
Neutro Abs: 5.1 10*3/uL (ref 1.4–6.5)
Neutrophils Relative %: 74 %
Platelets: 328 10*3/uL (ref 150–440)
RBC: 3.84 MIL/uL (ref 3.80–5.20)
RDW: 17.9 % — ABNORMAL HIGH (ref 11.5–14.5)
WBC: 7 10*3/uL (ref 3.6–11.0)

## 2015-04-11 LAB — COMPREHENSIVE METABOLIC PANEL
ALT: 12 U/L — ABNORMAL LOW (ref 14–54)
AST: 21 U/L (ref 15–41)
Albumin: 3.8 g/dL (ref 3.5–5.0)
Alkaline Phosphatase: 43 U/L (ref 38–126)
Anion gap: 7 (ref 5–15)
BUN: 19 mg/dL (ref 6–20)
CO2: 26 mmol/L (ref 22–32)
Calcium: 8.4 mg/dL — ABNORMAL LOW (ref 8.9–10.3)
Chloride: 104 mmol/L (ref 101–111)
Creatinine, Ser: 0.91 mg/dL (ref 0.44–1.00)
GFR calc Af Amer: >60 mL/min (ref >60)
GFR calc non Af Amer: 58 mL/min — ABNORMAL LOW (ref >60)
Glucose, Bld: 146 mg/dL — ABNORMAL HIGH (ref 65–99)
Potassium: 4.6 mmol/L (ref 3.5–5.1)
Sodium: 137 mmol/L (ref 135–145)
Total Bilirubin: 0.6 mg/dL (ref 0.3–1.2)
Total Protein: 6.2 g/dL — ABNORMAL LOW (ref 6.5–8.1)

## 2015-04-11 NOTE — Progress Notes (Signed)
[facility] day:  04/11/2015  Chief Complaint: [name] is a 81 y.o. 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  HPI:  The patient was last seen in the medical oncology clinic on 01/08/2015.  At that time, she was seen for initial assessment by me.  She had been diagnosed with PV in 2008. As she was on a phlebotomy program as well as hydroxyurea.  CBC included a hematocrit of 40.0, hemoglobin 12.5, MCV 76.5, platelets 446,000, and white count 10,900 with an [place] of 8500. Her hydroxyurea dose remained the same.  She did not undergo phlebotomy as her hematocrit was less than 42.  At last visit, she was also noted to have a history of iron deficiency anemia. Last EGD and colonoscopy in 09/11/2013 was negative.  The patient notes a TIA on 11/21/2014. She presented with a left sided facial weakness.  She states that in 12/2014, she fell backwards onto two-step bladder as she was going through her husband things (books on the top shelf). She then developed a "terrible stomach pain".  She was admitted to [facility] on 03/28/2015 with a partial small bowel obstruction. Abdominal and pelvic CT scan on 03/22/2015 revealed a 3 x 4.9 cm apple core lesion involving a loop of small bowel in the right mid abdomen worrisome for small bowel adenocarcinoma versus carcinoid. There was associated 4 x 1.8 cm adjacent mesenteric implant. There was a 1.3 cm left adrenal nodule, indeterminate.  She underwent ileocolectomy on 03/28/2015.  Pathology revealed a grade I neuroendocrine tumor (carcinoid) involving the terminal ileum and right colon. Largest tumor was 2.5 cm. There were multiple adjacent nodules of a neuroendocrine tumor. Metastasis was in 9 of 12 lymph nodes.  She states that she has a  follow-up with Dr. Rochel Brome move her staples this afternoon. Symptomatically, she doesn't have any energy. She feels tired then "feels it that in my back". She has to walk and sit down.  She denies any nausea, vomiting or diarrhea.  Past Medical History  Diagnosis Date  . Hypertension   . Hyperuricemia   . Diabetes mellitus without complication ([place])   . H/O TIA (transient ischemic attack) and stroke   . Adenocarcinoma in situ of cervix   . H/O compression fracture of spine 2014    thoracic spine  . Microalbuminuria   . Trochanteric bursitis   . Stroke ([place])   . H/O polycythemia vera   . Recurrent falls   . Polycythemia vera ([place])   . Endometrial carcinoma ([facility])     s/p total abdominal hysterectomy  . Breast cancer ([place])   . Skin cancer     face, legs  . Colon cancer [facility])     Past Surgical History  Procedure Laterality Date  . Tonsillectomy    . Exploratory laparotomy      for fibroids  . Cataract extraction w/ intraocular lens implant Right   . Abdominal hysterectomy    . Bowel resection N/A 03/28/2015    Procedure: SMALL BOWEL RESECTION;  Surgeon: Leonie Green, MD;  Location: [facility] ORS;  Service: General;  Laterality: N/A;    Family History  Problem Relation Age of Onset  . Heart attack Mother   . Breast cancer Mother   . Heart attack Father   . Heart attack Sister   .  Diabetes Sister     Social History:  reports that she has quit smoking. She has never used smokeless tobacco. She reports that she does not drink alcohol or use illicit drugs.  the patient's husband died a period she has no family in [place]. 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 The patient is accompanied by her friend [name], a Pharmacist, hospital, today.  Allergies:  Allergies  Allergen Reactions  . Simvastatin     Other reaction(s): Muscle Pain    Current Medications: Current Outpatient Prescriptions  Medication Sig Dispense Refill  . alendronate (FOSAMAX) 70 MG tablet  Take 70 mg by mouth once a week.     Marland Kitchen aspirin EC 81 MG tablet Take 81 mg by mouth daily.    . calcium-vitamin D (OSCAL WITH D) 500-200 MG-UNIT per tablet Take 1 tablet by mouth daily.     . carvedilol (COREG) 6.25 MG tablet Take 6.25 mg by mouth 2 (two) times daily with a meal.    . clopidogrel (PLAVIX) 75 MG tablet Take 1 tablet (75 mg total) by mouth daily. 30 tablet 0  . fexofenadine (ALLEGRA) 180 MG tablet Take 180 mg by mouth daily as needed.     . hydrALAZINE (APRESOLINE) 50 MG tablet Take 1 tablet (50 mg total) by mouth every 8 (eight) hours. (Patient taking differently: Take 25 mg by mouth 2 (two) times daily. ) 90 tablet 0  . hydroxyurea (HYDREA) 500 MG capsule Take 500 mg by mouth daily.     . metFORMIN (GLUCOPHAGE) 1000 MG tablet Take 1,000 mg by mouth 2 (two) times daily.     . potassium chloride (K-DUR,KLOR-CON) 10 MEQ tablet Take 10 mEq by mouth daily.    . pravastatin (PRAVACHOL) 20 MG tablet Take 20 mg by mouth at bedtime.     . Probiotic Product (PROBIOTIC DAILY PO) Take 1 tablet by mouth daily.    . ramipril (ALTACE) 10 MG capsule Take 10 mg by mouth 2 (two) times daily.     . verapamil (CALAN-SR) 240 MG CR tablet Take 240 mg by mouth daily.     No current facility-administered medications for this visit.    Review of Systems:  GENERAL: No energy. No fevers, sweats or weight loss. PERFORMANCE STATUS (ECOG): 1-2 HEENT: No visual changes, runny nose, sore throat, mouth sores or tenderness. Lungs: No shortness of breath or cough. No hemoptysis. Cardiac: No chest pain, palpitations, orthopnea, or PND. GI: No nausea, vomiting, diarrhea, constipation, melena or hematochezia. GU: No urgency, frequency, dysuria, or hematuria. Musculoskeletal: No back pain. No joint pain. No muscle tenderness. Extremities: No pain or swelling. Skin: No rashes or skin changes. Neuro: Few dizzy episodes.  No headache, numbness or weakness, balance or coordination  issues. Endocrine: No diabetes, thyroid issues, hot flashes or night sweats. Psych: No mood changes, depression or anxiety. Pain: No focal pain. Review of systems: All other systems reviewed and found to be negative.  Physical Exam: Blood pressure 146/71, pulse 66, temperature 98 F (36.7 C), height 5' 1"  (1.549 m), weight 144 lb 6.4 oz (65.5 kg).  GENERAL: Well developed, well nourished, sitting comfortably in the exam room in no acute distress MENTAL STATUS: Alert and oriented to person, place and time. HEAD: Short styled dark blonde hair. Normocephalic, atraumatic, face symmetric, no Cushingoid features. EYES: Glasses. Blue eyes. Pupils equal round and reactive to light and accomodation. No conjunctivitis or scleral icterus. ENT: Oropharynx clear without lesion. Tongue normal. Mucous membranes moist.  RESPIRATORY: Clear to auscultation without rales, wheezes or rhonchi. CARDIOVASCULAR: Regular rate and rhythm without murmur, rub or gallop. ABDOMEN: Long ventral hernia.  Staples in place.  Soft, non-tender, with active bowel sounds, and no hepatosplenomegaly. No masses. SKIN: No rashes, ulcers or lesions. EXTREMITIES: No edema, no skin discoloration or tenderness. No palpable cords. LYMPH NODES: No palpable cervical, supraclavicular, axillary or inguinal adenopathy  NEUROLOGICAL: Unremarkable. PSYCH: Appropriate.   Appointment on 04/11/2015  Component Date Value Ref Range Status  . WBC 04/11/2015 7.0  3.6 - 11.0 K/uL Final  . RBC 04/11/2015 3.84  3.80 - 5.20 MIL/uL Final  . Hemoglobin 04/11/2015 9.8* 12.0 - 16.0 g/dL Final  . HCT 04/11/2015 31.0* 35.0 - 47.0 % Final  . MCV 04/11/2015 80.8  80.0 - 100.0 fL Final  . MCH 04/11/2015 25.6* 26.0 - 34.0 pg Final  . MCHC 04/11/2015 31.6* 32.0 - 36.0 g/dL Final  . RDW 04/11/2015 17.9* 11.5 - 14.5 % Final  . Platelets 04/11/2015 328  150 - 440 K/uL Final  . Neutrophils Relative % 04/11/2015 74   Final  . Neutro Abs  04/11/2015 5.1  1.4 - 6.5 K/uL Final  . Lymphocytes Relative 04/11/2015 12   Final  . Lymphs Abs 04/11/2015 0.8* 1.0 - 3.6 K/uL Final  . Monocytes Relative 04/11/2015 7   Final  . Monocytes Absolute 04/11/2015 0.5  0.2 - 0.9 K/uL Final  . Eosinophils Relative 04/11/2015 4   Final  . Eosinophils Absolute 04/11/2015 0.3  0 - 0.7 K/uL Final  . Basophils Relative 04/11/2015 3   Final  . Basophils Absolute 04/11/2015 0.2* 0 - 0.1 K/uL Final  . Sodium 04/11/2015 137  135 - 145 mmol/L Final  . Potassium 04/11/2015 4.6  3.5 - 5.1 mmol/L Final  . Chloride 04/11/2015 104  101 - 111 mmol/L Final  . CO2 04/11/2015 26  22 - 32 mmol/L Final  . Glucose, Bld 04/11/2015 146* 65 - 99 mg/dL Final  . BUN 04/11/2015 19  6 - 20 mg/dL Final  . Creatinine, Ser 04/11/2015 0.91  0.44 - 1.00 mg/dL Final  . Calcium 04/11/2015 8.4* 8.9 - 10.3 mg/dL Final  . Total Protein 04/11/2015 6.2* 6.5 - 8.1 g/dL Final  . Albumin 04/11/2015 3.8  3.5 - 5.0 g/dL Final  . AST 04/11/2015 21  15 - 41 U/L Final  . ALT 04/11/2015 12* 14 - 54 U/L Final  . Alkaline Phosphatase 04/11/2015 43  38 - 126 U/L Final  . Total Bilirubin 04/11/2015 0.6  0.3 - 1.2 mg/dL Final  . GFR calc non Af Amer 04/11/2015 58* >60 mL/min Final  . GFR calc Af Amer 04/11/2015 >60  >60 mL/min Final   Comment: (NOTE) The eGFR has been calculated using the CKD EPI equation. This calculation has not been validated in all clinical situations. eGFR's persistently <60 mL/min signify possible Chronic Kidney Disease.   [name] gap 04/11/2015 7  5 - 15 Final    Assessment:  JISELLA ASHENFELTER is a 81 y.o. female with JAK2+ polycythemia rubra vera (PV) diagnosed in 2008. She has undergone low phlebotomies x 2-3 to maintain a target hematocrit is 40-42.5. She is on hydroxyurea 1 pill Monday, Wednesday, and Friday alternating with the following week on Monday, Wednesday, Friday, and Saturday. Screening for von Willebrand and a platelet function was normal. She is on a  baby aspirin. She has been on Plavix since her TIA/CVA.   She has a history of iron deficiency anemia status post  GI evaluation. Colonoscopy in 07/2007 was negative. For persistent recurrent iron deficiency anemia, she underwent EGD and colonoscopy in 12/2011 and 09/11/2013.  She was diagnosed with carcinoid tumor after presenting with a partial small bowel obstruction.  Abdominal and pelvic CT scan on 03/22/2015 revealed a 3 x 4.9 cm apple core lesion involving a loop of small bowel in the right mid abdomen. There was associated 4 x 1.8 cm adjacent mesenteric implant. She underwent ileocolectomy on 03/28/2015.  Pathology revealed a grade I neuroendocrine tumor (carcinoid) involving the terminal ileum and right colon.  Largest tumor was 2.5 cm. There were multiple adjacent nodules of a neuroendocrine tumor. Metastasis was in 9 of 12 lymph nodes.  Pathologic stage was T3N1.  Symptomatically, she is fatigued. Exam reveals post-operative changes.  Plan: 1. Review diagnosis and treatment of carcinoid.  Discuss pathology.  Discuss baseline labs and octreotide scan. 2. Labs today:  CBC with diff, CMP, chromogranin. 3. Collect 24 hour urine for 5HIAA. 4. Schedule octreotide scan- baseline post surgery. 5. Continue current dose of hydroxyurea. 6. RTC after octreotide scan.   Lequita Asal, MD  04/11/2015, 11:24 AM

## 2015-04-11 NOTE — Progress Notes (Signed)
Patient seen for resection follow up following colon on 10/6

## 2015-04-14 DIAGNOSIS — C7A010 Malignant carcinoid tumor of the duodenum: Secondary | ICD-10-CM | POA: Diagnosis not present

## 2015-04-15 ENCOUNTER — Other Ambulatory Visit: Payer: Self-pay | Admitting: *Deleted

## 2015-04-15 DIAGNOSIS — R42 Dizziness and giddiness: Secondary | ICD-10-CM | POA: Insufficient documentation

## 2015-04-15 DIAGNOSIS — C7A00 Malignant carcinoid tumor of unspecified site: Secondary | ICD-10-CM

## 2015-04-19 LAB — 5 HIAA, QUANTITATIVE, URINE, 24 HOUR
5-HIAA, Ur: 2 mg/L
5-HIAA,Quant.,24 Hr Urine: 5.2 mg/24 hr (ref 0.0–14.9)
Total Volume: 2600

## 2015-05-06 ENCOUNTER — Other Ambulatory Visit: Payer: Self-pay | Admitting: *Deleted

## 2015-05-06 ENCOUNTER — Other Ambulatory Visit: Payer: Self-pay

## 2015-05-07 ENCOUNTER — Inpatient Hospital Stay: Payer: Medicare Other | Attending: Hematology and Oncology

## 2015-05-07 ENCOUNTER — Encounter: Payer: Self-pay | Admitting: Hematology and Oncology

## 2015-05-07 ENCOUNTER — Encounter: Admission: RE | Admit: 2015-05-07 | Payer: Medicare Other | Source: Ambulatory Visit

## 2015-05-07 ENCOUNTER — Other Ambulatory Visit: Payer: Self-pay | Admitting: Hematology and Oncology

## 2015-05-07 DIAGNOSIS — C7A00 Malignant carcinoid tumor of unspecified site: Secondary | ICD-10-CM | POA: Insufficient documentation

## 2015-05-07 DIAGNOSIS — D45 Polycythemia vera: Secondary | ICD-10-CM | POA: Insufficient documentation

## 2015-05-09 LAB — CHROMOGRANIN A: Chromogranin A: 4 nmol/L (ref 0–5)

## 2015-05-23 ENCOUNTER — Inpatient Hospital Stay: Payer: Medicare Other | Admitting: Hematology and Oncology

## 2015-05-27 ENCOUNTER — Other Ambulatory Visit: Payer: Self-pay | Admitting: Hematology and Oncology

## 2015-05-29 ENCOUNTER — Encounter
Admission: RE | Admit: 2015-05-29 | Discharge: 2015-05-29 | Disposition: A | Discharge status: Home / Self Care | Payer: Medicare Other | Source: Ambulatory Visit | Attending: Hematology and Oncology | Admitting: Hematology and Oncology

## 2015-05-29 DIAGNOSIS — I700 Atherosclerosis of aorta: Secondary | ICD-10-CM | POA: Diagnosis not present

## 2015-05-29 DIAGNOSIS — C7A00 Malignant carcinoid tumor of unspecified site: Secondary | ICD-10-CM | POA: Diagnosis present

## 2015-05-29 DIAGNOSIS — C7A012 Malignant carcinoid tumor of the ileum: Secondary | ICD-10-CM | POA: Insufficient documentation

## 2015-05-29 MED ORDER — INDIUM IN-111 PENTETREOTIDE IV KIT
6.3250 | PACK | Freq: Once | INTRAVENOUS | Status: AC | PRN
  Administered 2015-05-29: 6.325 via INTRAVENOUS

## 2015-05-30 ENCOUNTER — Encounter
Admission: RE | Admit: 2015-05-30 | Discharge: 2015-05-30 | Disposition: A | Discharge status: Home / Self Care | Payer: Medicare Other | Source: Ambulatory Visit | Attending: Hematology and Oncology | Admitting: Hematology and Oncology

## 2015-05-31 ENCOUNTER — Encounter
Admission: RE | Admit: 2015-05-31 | Discharge: 2015-05-31 | Disposition: A | Discharge status: Home / Self Care | Payer: Medicare Other | Source: Ambulatory Visit | Attending: Hematology and Oncology | Admitting: Hematology and Oncology

## 2015-06-04 ENCOUNTER — Inpatient Hospital Stay: Payer: Medicare Other | Attending: Hematology and Oncology | Admitting: Hematology and Oncology

## 2015-06-04 VITALS — BP 140/85 | HR 75 | Temp 97.7°F | Ht 61.0 in | Wt 137.8 lb

## 2015-06-04 DIAGNOSIS — R5383 Other fatigue: Secondary | ICD-10-CM | POA: Diagnosis not present

## 2015-06-04 DIAGNOSIS — R911 Solitary pulmonary nodule: Secondary | ICD-10-CM

## 2015-06-04 DIAGNOSIS — Z85828 Personal history of other malignant neoplasm of skin: Secondary | ICD-10-CM | POA: Diagnosis not present

## 2015-06-04 DIAGNOSIS — C7A00 Malignant carcinoid tumor of unspecified site: Secondary | ICD-10-CM

## 2015-06-04 DIAGNOSIS — Z79899 Other long term (current) drug therapy: Secondary | ICD-10-CM | POA: Diagnosis not present

## 2015-06-04 DIAGNOSIS — I10 Essential (primary) hypertension: Secondary | ICD-10-CM | POA: Insufficient documentation

## 2015-06-04 DIAGNOSIS — Z7982 Long term (current) use of aspirin: Secondary | ICD-10-CM | POA: Diagnosis not present

## 2015-06-04 DIAGNOSIS — Z8541 Personal history of malignant neoplasm of cervix uteri: Secondary | ICD-10-CM | POA: Insufficient documentation

## 2015-06-04 DIAGNOSIS — C7A012 Malignant carcinoid tumor of the ileum: Secondary | ICD-10-CM | POA: Diagnosis present

## 2015-06-04 DIAGNOSIS — C7B01 Secondary carcinoid tumors of distant lymph nodes: Secondary | ICD-10-CM | POA: Diagnosis not present

## 2015-06-04 DIAGNOSIS — K5900 Constipation, unspecified: Secondary | ICD-10-CM

## 2015-06-04 DIAGNOSIS — Z87891 Personal history of nicotine dependence: Secondary | ICD-10-CM | POA: Diagnosis not present

## 2015-06-04 DIAGNOSIS — Z853 Personal history of malignant neoplasm of breast: Secondary | ICD-10-CM | POA: Insufficient documentation

## 2015-06-04 DIAGNOSIS — D45 Polycythemia vera: Secondary | ICD-10-CM | POA: Insufficient documentation

## 2015-06-04 DIAGNOSIS — Z7984 Long term (current) use of oral hypoglycemic drugs: Secondary | ICD-10-CM

## 2015-06-04 NOTE — Progress Notes (Signed)
[facility] day:  06/04/2015  Chief Complaint: [name] is a 81 y.o. 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  HPI:  The patient was last seen in the medical oncology clinic on 04/11/2015.  At that time, she was seen for 3 month assessment and follow-up and discussion regarding direction of therapy after interval colectomy on 03/28/2015.  Pathology revealed a grade I neuroendocrine tumor (carcinoid) involving the terminal ileum and right colon. Largest tumor was 2.5 cm. There were multiple adjacent nodules of a neuroendocrine tumor. Metastasis was in 9 of 12 lymph nodes. Symptomatically, she felt tired.  She denies any nausea, vomiting, diarrhea, or flushing.  Labs at last visit included a hematocrit of 31.0, hemoglobin 9.8, MCV 80.8, platelets 328,00, WBC 7000 with an [place] of 5100.  Chromogranin was 4 on 05/07/2015.   24 hour urine for 5HIAA was 5.2 mg/24 hours (0-14.9).  Baseline octreotide scan on 05/31/2015 revealed no evidence of metastatic disease.  There was an indeterminate 9 mm nodule in the left apex.  Symptomatically, she denies any complaint.  She has a little constipation.  Past Medical History  Diagnosis Date  . Hypertension   . Hyperuricemia   . Diabetes mellitus without complication ([place])   . H/O TIA (transient ischemic attack) and stroke   . Adenocarcinoma in situ of cervix   . H/O compression fracture of spine 2014    thoracic spine  . Microalbuminuria   . Trochanteric bursitis   . Stroke ([place])   . H/O polycythemia vera   . Recurrent falls   . Polycythemia vera ([place])   . Endometrial carcinoma (HCC)     s/p total abdominal hysterectomy  . Breast cancer ([place])   . Skin cancer     face, legs  . Colon cancer [facility])     Past Surgical History  Procedure Laterality Date  .  Tonsillectomy    . Exploratory laparotomy      for fibroids  . Cataract extraction w/ intraocular lens implant Right   . Abdominal hysterectomy    . Bowel resection N/A 03/28/2015    Procedure: SMALL BOWEL RESECTION;  Surgeon: Leonie Green, MD;  Location: [facility] ORS;  Service: General;  Laterality: N/A;    Family History  Problem Relation Age of Onset  . Heart attack Mother   . Breast cancer Mother   . Heart attack Father   . Heart attack Sister   . Diabetes Sister   Her brother died of AML.  Social History:  reports that she has quit smoking. She has never used smokeless tobacco. She reports that she does not drink alcohol or use illicit drugs.  The patient's husband died of AML.  She has no family in [place]. 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 The patient is accompanied by her niece, [name].  Allergies:  Allergies  Allergen Reactions  . Simvastatin     Other reaction(s): Muscle Pain    Current Medications: Current Outpatient Prescriptions  Medication Sig Dispense Refill  . alendronate (FOSAMAX) 70 MG tablet Take 70 mg by mouth once a week.     Marland Kitchen aspirin EC 81 MG tablet Take 81 mg by mouth daily.    . calcium-vitamin D (OSCAL WITH D) 500-200 MG-UNIT per tablet Take 1 tablet by mouth daily.     Marland Kitchen  carvedilol (COREG) 6.25 MG tablet Take 6.25 mg by mouth 2 (two) times daily with a meal.    . clopidogrel (PLAVIX) 75 MG tablet Take 1 tablet (75 mg total) by mouth daily. 30 tablet 0  . fexofenadine (ALLEGRA) 180 MG tablet Take 180 mg by mouth daily as needed.     . hydrALAZINE (APRESOLINE) 50 MG tablet Take 1 tablet (50 mg total) by mouth every 8 (eight) hours. (Patient taking differently: Take 25 mg by mouth 2 (two) times daily. ) 90 tablet 0  . hydroxyurea (HYDREA) 500 MG capsule TAKE 1 CAPSULE BY MOUTH EVERY DAY 30 capsule 0  . metFORMIN (GLUCOPHAGE) 1000 MG tablet Take 1,000 mg by mouth 2 (two) times daily.     . polyethylene glycol (MIRALAX /  GLYCOLAX) packet Take 17 g by mouth daily as needed.    . potassium chloride (K-DUR,KLOR-CON) 10 MEQ tablet Take 10 mEq by mouth daily.    . pravastatin (PRAVACHOL) 20 MG tablet Take 20 mg by mouth at bedtime.     . Probiotic Product (PROBIOTIC DAILY PO) Take 1 tablet by mouth daily.    . ramipril (ALTACE) 10 MG capsule Take 10 mg by mouth 2 (two) times daily.     . verapamil (CALAN-SR) 240 MG CR tablet Take 240 mg by mouth daily.     No current facility-administered medications for this visit.    Review of Systems:  GENERAL: Feels "ok". No fevers, sweats or weight loss. PERFORMANCE STATUS (ECOG): 1-2 HEENT: No visual changes, runny nose, sore throat, mouth sores or tenderness. Lungs: No shortness of breath or cough. No hemoptysis. Cardiac: No chest pain, palpitations, orthopnea, or PND. GI: Little constipation.  No nausea, vomiting, diarrhea, melena or hematochezia. GU: No urgency, frequency, dysuria, or hematuria. Musculoskeletal: No back pain. No joint pain. No muscle tenderness. Extremities: No pain or swelling. Skin: No rashes or skin changes. Neuro: Few dizzy episodes.  No headache, numbness or weakness, balance or coordination issues. Endocrine: Diabetes.  No thyroid issues, hot flashes or night sweats. Psych: No mood changes, depression or anxiety. Pain: No focal pain. Review of systems: All other systems reviewed and found to be negative.  Physical Exam: Blood pressure 140/85, pulse 75, temperature 97.7 F (36.5 C), temperature source Tympanic, height 5\' 1"  (1.549 m), weight 137 lb 12.6 oz (62.5 kg).  GENERAL: Well developed, well nourished, sitting comfortably in the exam room in no acute distress MENTAL STATUS: Alert and oriented to person, place and time. HEAD: Short styled dark blonde hair. Normocephalic, atraumatic, face symmetric, no Cushingoid features. EYES: Glasses. Blue eyes. Pupils equal round and reactive to light and accomodation. No  conjunctivitis or scleral icterus. ENT: Oropharynx clear without lesion. Tongue normal. Mucous membranes moist.  RESPIRATORY: Clear to auscultation without rales, wheezes or rhonchi. CARDIOVASCULAR: Regular rate and rhythm without murmur, rub or gallop. ABDOMEN: Long ventral hernia.  Soft, non-tender, with active bowel sounds, and no hepatosplenomegaly. No masses. SKIN: No rashes, ulcers or lesions. EXTREMITIES: No edema, no skin discoloration or tenderness. No palpable cords. LYMPH NODES: No palpable cervical, supraclavicular, axillary or inguinal adenopathy  NEUROLOGICAL: Unremarkable. PSYCH: Appropriate.   No visits with results within 3 Day(s) from this visit. Latest known visit with results is:  Appointment on 05/07/2015  Component Date Value Ref Range Status  . Chromogranin A 05/07/2015 4  0 - 5 nmol/L Final   Comment: (NOTE) Chromogranin A performed by [employer] methodology. Results for this test are designated to be for research purposes  only by the assay's manufacturer. The performance characteristics of this product have not been established. Results for this test should not be used as absolute evidence of presence or absence of malignant disease without confirmation of the diagnosis by another medically established diagnostic product or procedure. Values obtained with different assay methods or kits cannot be used interchangeably. Performed At: [facility] [address] [place], [place] [id number] Lindon Romp MD [id number]     Assessment:  [name] is a 81 y.o. female with JAK2+ polycythemia rubra vera (PV) diagnosed in 2008. She has undergone low phlebotomies x 2-3 to maintain a target hematocrit is 40-42.5.   She is on hydroxyurea 1 pill Monday, Wednesday, and Friday alternating with the following week on Monday, Wednesday, Friday, and Saturday. Screening for von Willebrand and a platelet function was normal. She is on a baby  aspirin. She has been on Plavix since her TIA/CVA.   She has a history of iron deficiency anemia status post GI evaluation. Colonoscopy in 07/2007 was negative. For persistent recurrent iron deficiency anemia, she underwent EGD and colonoscopy in 12/2011 and 09/11/2013.  She has a history of carcinoid tumor after presenting with a partial small bowel obstruction.  Abdominal and pelvic CT scan on 03/22/2015 revealed a 3 x 4.9 cm apple core lesion involving a loop of small bowel in the right mid abdomen. There was associated 4 x 1.8 cm adjacent mesenteric implant. Ileocolectomy on 03/28/2015 revealed a grade I neuroendocrine tumor (carcinoid) involving the terminal ileum and right colon.  Largest tumor was 2.5 cm. There were multiple adjacent nodules of a neuroendocrine tumor. Metastasis was in 9 of 12 lymph nodes.  Pathologic stage was T3N1.  Chromogranin was 4 on 05/07/2015.   24 hour urine for 5HIAA was 5.2 mg/24 hours (0-14.9).  Octreotide scan on 05/31/2015 revealed no evidence of metastatic disease.  There was an indeterminate 9 mm nodule in the left apex.  Symptomatically, she  Feels "ok". Exam is unremarkable.  Plan: 1.  Review interval labs and scans.  Discuss unclear significance of left apex lung nodule. 2.  Discuss surveillance for history of carcinoid.  If any concerning symptoms, repeat labs +/- imaging. 3.  Continue current dose of hydroxyurea. 4.  Schedule chest CT:  Follow-up left apex nodule. 5.  RTC on 07/12/2015 for MD assess, labs (CBC with diff, CMP), and review of chest CT.   Lequita Asal, MD  06/04/2015, 11:36 AM

## 2015-06-11 ENCOUNTER — Ambulatory Visit
Admission: RE | Admit: 2015-06-11 | Discharge: 2015-06-11 | Disposition: A | Discharge status: Home / Self Care | Payer: Medicare Other | Source: Ambulatory Visit | Attending: Hematology and Oncology | Admitting: Hematology and Oncology

## 2015-06-11 DIAGNOSIS — I700 Atherosclerosis of aorta: Secondary | ICD-10-CM | POA: Insufficient documentation

## 2015-06-11 DIAGNOSIS — R911 Solitary pulmonary nodule: Secondary | ICD-10-CM

## 2015-06-11 DIAGNOSIS — M5134 Other intervertebral disc degeneration, thoracic region: Secondary | ICD-10-CM | POA: Insufficient documentation

## 2015-06-11 DIAGNOSIS — I2510 Atherosclerotic heart disease of native coronary artery without angina pectoris: Secondary | ICD-10-CM | POA: Insufficient documentation

## 2015-06-11 DIAGNOSIS — M4854XA Collapsed vertebra, not elsewhere classified, thoracic region, initial encounter for fracture: Secondary | ICD-10-CM | POA: Diagnosis not present

## 2015-07-07 ENCOUNTER — Encounter: Payer: Self-pay | Admitting: Hematology and Oncology

## 2015-07-12 ENCOUNTER — Encounter: Payer: Self-pay | Admitting: Hematology and Oncology

## 2015-07-12 ENCOUNTER — Inpatient Hospital Stay: Payer: Medicare Other | Attending: Hematology and Oncology | Admitting: Hematology and Oncology

## 2015-07-12 ENCOUNTER — Inpatient Hospital Stay: Payer: Medicare Other

## 2015-07-12 ENCOUNTER — Other Ambulatory Visit: Payer: Self-pay

## 2015-07-12 VITALS — BP 145/77 | HR 69 | Temp 97.0°F | Resp 18 | Ht 61.0 in | Wt 137.1 lb

## 2015-07-12 DIAGNOSIS — C7B01 Secondary carcinoid tumors of distant lymph nodes: Secondary | ICD-10-CM | POA: Diagnosis not present

## 2015-07-12 DIAGNOSIS — C7A012 Malignant carcinoid tumor of the ileum: Secondary | ICD-10-CM | POA: Diagnosis not present

## 2015-07-12 DIAGNOSIS — Z806 Family history of leukemia: Secondary | ICD-10-CM

## 2015-07-12 DIAGNOSIS — Z87891 Personal history of nicotine dependence: Secondary | ICD-10-CM

## 2015-07-12 DIAGNOSIS — Z8673 Personal history of transient ischemic attack (TIA), and cerebral infarction without residual deficits: Secondary | ICD-10-CM | POA: Diagnosis not present

## 2015-07-12 DIAGNOSIS — Z79899 Other long term (current) drug therapy: Secondary | ICD-10-CM

## 2015-07-12 DIAGNOSIS — Z90710 Acquired absence of both cervix and uterus: Secondary | ICD-10-CM | POA: Diagnosis not present

## 2015-07-12 DIAGNOSIS — R918 Other nonspecific abnormal finding of lung field: Secondary | ICD-10-CM

## 2015-07-12 DIAGNOSIS — Z853 Personal history of malignant neoplasm of breast: Secondary | ICD-10-CM | POA: Diagnosis not present

## 2015-07-12 DIAGNOSIS — Z85828 Personal history of other malignant neoplasm of skin: Secondary | ICD-10-CM | POA: Insufficient documentation

## 2015-07-12 DIAGNOSIS — D45 Polycythemia vera: Secondary | ICD-10-CM | POA: Diagnosis present

## 2015-07-12 DIAGNOSIS — Z8541 Personal history of malignant neoplasm of cervix uteri: Secondary | ICD-10-CM

## 2015-07-12 DIAGNOSIS — Z7982 Long term (current) use of aspirin: Secondary | ICD-10-CM

## 2015-07-12 DIAGNOSIS — Z7984 Long term (current) use of oral hypoglycemic drugs: Secondary | ICD-10-CM | POA: Insufficient documentation

## 2015-07-12 DIAGNOSIS — Z90722 Acquired absence of ovaries, bilateral: Secondary | ICD-10-CM | POA: Insufficient documentation

## 2015-07-12 DIAGNOSIS — R911 Solitary pulmonary nodule: Secondary | ICD-10-CM

## 2015-07-12 DIAGNOSIS — Z803 Family history of malignant neoplasm of breast: Secondary | ICD-10-CM

## 2015-07-12 DIAGNOSIS — Z8781 Personal history of (healed) traumatic fracture: Secondary | ICD-10-CM | POA: Insufficient documentation

## 2015-07-12 DIAGNOSIS — D509 Iron deficiency anemia, unspecified: Secondary | ICD-10-CM | POA: Insufficient documentation

## 2015-07-12 DIAGNOSIS — R5383 Other fatigue: Secondary | ICD-10-CM | POA: Diagnosis not present

## 2015-07-12 DIAGNOSIS — E119 Type 2 diabetes mellitus without complications: Secondary | ICD-10-CM | POA: Diagnosis not present

## 2015-07-12 DIAGNOSIS — I10 Essential (primary) hypertension: Secondary | ICD-10-CM | POA: Diagnosis not present

## 2015-07-12 DIAGNOSIS — C7A00 Malignant carcinoid tumor of unspecified site: Secondary | ICD-10-CM

## 2015-07-12 LAB — CBC WITH DIFFERENTIAL/PLATELET
Basophils Absolute: 0.2 10*3/uL — ABNORMAL HIGH (ref 0–0.1)
Basophils Relative: 1 %
Eosinophils Absolute: 0.4 10*3/uL (ref 0–0.7)
Eosinophils Relative: 3 %
HCT: 35 % (ref 35.0–47.0)
Hemoglobin: 10.5 g/dL — ABNORMAL LOW (ref 12.0–16.0)
Lymphocytes Relative: 10 %
Lymphs Abs: 1.3 10*3/uL (ref 1.0–3.6)
MCH: 21 pg — ABNORMAL LOW (ref 26.0–34.0)
MCHC: 30.1 g/dL — ABNORMAL LOW (ref 32.0–36.0)
MCV: 69.9 fL — ABNORMAL LOW (ref 80.0–100.0)
Monocytes Absolute: 0.6 10*3/uL (ref 0.2–0.9)
Monocytes Relative: 5 %
Neutro Abs: 10.7 10*3/uL — ABNORMAL HIGH (ref 1.4–6.5)
Neutrophils Relative %: 81 %
Platelets: 296 10*3/uL (ref 150–440)
RBC: 5 MIL/uL (ref 3.80–5.20)
RDW: 17.6 % — ABNORMAL HIGH (ref 11.5–14.5)
WBC: 13.2 10*3/uL — ABNORMAL HIGH (ref 3.6–11.0)

## 2015-07-12 LAB — COMPREHENSIVE METABOLIC PANEL
ALT: 10 U/L — ABNORMAL LOW (ref 14–54)
AST: 14 U/L — ABNORMAL LOW (ref 15–41)
Albumin: 4 g/dL (ref 3.5–5.0)
Alkaline Phosphatase: 39 U/L (ref 38–126)
Anion gap: 4 — ABNORMAL LOW (ref 5–15)
BUN: 19 mg/dL (ref 6–20)
CO2: 28 mmol/L (ref 22–32)
Calcium: 9 mg/dL (ref 8.9–10.3)
Chloride: 104 mmol/L (ref 101–111)
Creatinine, Ser: 0.94 mg/dL (ref 0.44–1.00)
GFR calc Af Amer: >60 mL/min (ref >60)
GFR calc non Af Amer: 55 mL/min — ABNORMAL LOW (ref >60)
Glucose, Bld: 166 mg/dL — ABNORMAL HIGH (ref 65–99)
Potassium: 4.1 mmol/L (ref 3.5–5.1)
Sodium: 136 mmol/L (ref 135–145)
Total Bilirubin: 0.5 mg/dL (ref 0.3–1.2)
Total Protein: 6.7 g/dL (ref 6.5–8.1)

## 2015-07-12 NOTE — Progress Notes (Signed)
[facility] day:  07/12/2015  Chief Complaint: [name] is a 82 y.o. 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  HPI:  The patient was last seen in the medical oncology clinic on 06/04/2015.  At that time, baseline octreotide scan on 05/31/2015 revealed no evidence of metastatic disease.  There was an indeterminate 9 mm nodule in the left apex.  She was schedule for a chest CT.  Chest CT on 06/11/2015 revealed an 8 mm nodular density (spiculated on axial images) in the left apex.  Follow-up was recommended in 3-6 months.  Symptomatically, she notes that it is taking her a long time to recover from her surgery.  She notes that she fatigues easily.  She notes 2 isolated episodes of upset stomach.  Last night she ate too much (cornbread, soup with ham hock) and had nausea, vomiting, and diarrhea.  She feels fine today.  Past Medical History  Diagnosis Date  . Hypertension   . Hyperuricemia   . Diabetes mellitus without complication ([place])   . H/O TIA (transient ischemic attack) and stroke   . Adenocarcinoma in situ of cervix   . H/O compression fracture of spine 2014    thoracic spine  . Microalbuminuria   . Trochanteric bursitis   . Stroke ([place])   . H/O polycythemia vera   . Recurrent falls   . Polycythemia vera ([place])   . Endometrial carcinoma (HCC)     s/p total abdominal hysterectomy  . Breast cancer ([place])   . Skin cancer     face, legs  . Colon cancer [facility])     Past Surgical History  Procedure Laterality Date  . Tonsillectomy    . Exploratory laparotomy      for fibroids  . Cataract extraction w/ intraocular lens implant Right   . Abdominal hysterectomy    . Bowel resection N/A 03/28/2015    Procedure: SMALL BOWEL RESECTION;  Surgeon: Leonie Green, MD;  Location: [facility] ORS;  Service:  General;  Laterality: N/A;    Family History  Problem Relation Age of Onset  . Heart attack Mother   . Breast cancer Mother   . Heart attack Father   . Heart attack Sister   . Diabetes Sister   . Cancer Brother     AML  Her brother died of AML.  Social History:  reports that she has quit smoking. She has never used smokeless tobacco. She reports that she does not drink alcohol or use illicit drugs.  The patient's husband died of AML.  She has no family in [place]. 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 The patient is alone today.  Allergies:  Allergies  Allergen Reactions  . Simvastatin     Other reaction(s): Muscle Pain    Current Medications: Current Outpatient Prescriptions  Medication Sig Dispense Refill  . alendronate (FOSAMAX) 70 MG tablet Take 70 mg by mouth once a week.     Marland Kitchen aspirin EC 81 MG tablet Take 81 mg by mouth daily.    . calcium-vitamin D (OSCAL WITH D) 500-200 MG-UNIT per tablet Take 1 tablet by mouth daily.     . carvedilol (COREG) 6.25 MG tablet Take 6.25 mg by mouth 2 (two) times daily with a meal.    . clopidogrel (PLAVIX) 75 MG  tablet Take 1 tablet (75 mg total) by mouth daily. 30 tablet 0  . fexofenadine (ALLEGRA) 180 MG tablet Take 180 mg by mouth daily as needed.     . hydrALAZINE (APRESOLINE) 50 MG tablet Take 1 tablet (50 mg total) by mouth every 8 (eight) hours. (Patient taking differently: Take 25 mg by mouth 2 (two) times daily. ) 90 tablet 0  . hydroxyurea (HYDREA) 500 MG capsule Take 500 mg by mouth daily. 1 tab on Mon, Wed, Fri one week and then the next week Mon, Wed, Fri, Sat    . metFORMIN (GLUCOPHAGE) 1000 MG tablet Take 1,000 mg by mouth 2 (two) times daily.     . polyethylene glycol (MIRALAX / GLYCOLAX) packet Take 17 g by mouth daily as needed.    . potassium chloride (K-DUR,KLOR-CON) 10 MEQ tablet Take 10 mEq by mouth daily.    . pravastatin (PRAVACHOL) 20 MG tablet Take 20 mg by mouth at bedtime.     . Probiotic  Product (PROBIOTIC DAILY PO) Take 1 tablet by mouth daily.    . ramipril (ALTACE) 10 MG capsule Take 10 mg by mouth 2 (two) times daily.     . verapamil (CALAN-SR) 240 MG CR tablet Take 240 mg by mouth daily.     No current facility-administered medications for this visit.    Review of Systems:  GENERAL: Lacks stamina.  Fatigues easily. No fevers or sweats.  Weight down 3 pounds. PERFORMANCE STATUS (ECOG): 1-2 HEENT: No visual changes, runny nose, sore throat, mouth sores or tenderness. Lungs: No shortness of breath or cough. No hemoptysis. Cardiac: No chest pain, palpitations, orthopnea, or PND. GI: Upset stomach last night.  No melena or hematochezia. GU: No urgency, frequency, dysuria, or hematuria. Musculoskeletal: No back pain. No joint pain. No muscle tenderness. Extremities: No pain or swelling. Skin: No rashes or skin changes. Neuro: No headache, numbness or weakness, balance or coordination issues. Endocrine: Diabetes.  No thyroid issues, hot flashes or night sweats. Psych: No mood changes, depression or anxiety. Pain: No focal pain. Review of systems: All other systems reviewed and found to be negative.  Physical Exam: Blood pressure 145/77, pulse 69, temperature 97 F (36.1 C), temperature source Tympanic, resp. rate 18, height _0  (1.549 m), weight 137 lb 2 oz (62.2 kg).  GENERAL: Well developed, well nourished, sitting comfortably in the exam room in no acute distress MENTAL STATUS: Alert and oriented to person, place and time. HEAD: Short styled dark blonde hair. Normocephalic, atraumatic, face symmetric, no Cushingoid features. EYES: Glasses. Blue eyes. Pupils equal round and reactive to light and accomodation. No conjunctivitis or scleral icterus. ENT: Oropharynx clear without lesion. Tongue normal. Mucous membranes moist.  RESPIRATORY: Clear to auscultation without rales, wheezes or rhonchi. CARDIOVASCULAR: Regular rate and rhythm  without murmur, rub or gallop. ABDOMEN: Long ventral hernia.  Soft, non-tender, with active bowel sounds, and no hepatosplenomegaly. No masses. SKIN: No rashes, ulcers or lesions. EXTREMITIES: No edema, no skin discoloration or tenderness. No palpable cords. LYMPH NODES: No palpable cervical, supraclavicular, axillary or inguinal adenopathy  NEUROLOGICAL: Unremarkable. PSYCH: Appropriate.   Appointment on 07/12/2015  Component Date Value Ref Range Status  . WBC 07/12/2015 13.2* 3.6 - 11.0 K/uL Final  . RBC 07/12/2015 5.00  3.80 - 5.20 MIL/uL Final  . Hemoglobin 07/12/2015 10.5* 12.0 - 16.0 g/dL Final   RESULT REPEATED AND VERIFIED  . HCT 07/12/2015 35.0  35.0 - 47.0 % Final   RESULT REPEATED AND VERIFIED  .  MCV 07/12/2015 69.9* 80.0 - 100.0 fL Final  . MCH 07/12/2015 21.0* 26.0 - 34.0 pg Final  . MCHC 07/12/2015 30.1* 32.0 - 36.0 g/dL Final  . RDW 07/12/2015 17.6* 11.5 - 14.5 % Final  . Platelets 07/12/2015 296  150 - 440 K/uL Final  . Neutrophils Relative % 07/12/2015 81%   Final  . Neutro Abs 07/12/2015 10.7* 1.4 - 6.5 K/uL Final  . Lymphocytes Relative 07/12/2015 10%   Final  . Lymphs Abs 07/12/2015 1.3  1.0 - 3.6 K/uL Final  . Monocytes Relative 07/12/2015 5%   Final  . Monocytes Absolute 07/12/2015 0.6  0.2 - 0.9 K/uL Final  . Eosinophils Relative 07/12/2015 3%   Final  . Eosinophils Absolute 07/12/2015 0.4  0 - 0.7 K/uL Final  . Basophils Relative 07/12/2015 1%   Final  . Basophils Absolute 07/12/2015 0.2* 0 - 0.1 K/uL Final  . Sodium 07/12/2015 136  135 - 145 mmol/L Final  . Potassium 07/12/2015 4.1  3.5 - 5.1 mmol/L Final  . Chloride 07/12/2015 104  101 - 111 mmol/L Final  . CO2 07/12/2015 28  22 - 32 mmol/L Final  . Glucose, Bld 07/12/2015 166* 65 - 99 mg/dL Final  . BUN 07/12/2015 19  6 - 20 mg/dL Final  . Creatinine, Ser 07/12/2015 0.94  0.44 - 1.00 mg/dL Final  . Calcium 07/12/2015 9.0  8.9 - 10.3 mg/dL Final  . Total Protein 07/12/2015 6.7  6.5 - 8.1 g/dL  Final  . Albumin 07/12/2015 4.0  3.5 - 5.0 g/dL Final  . AST 07/12/2015 14* 15 - 41 U/L Final  . ALT 07/12/2015 10* 14 - 54 U/L Final  . Alkaline Phosphatase 07/12/2015 39  38 - 126 U/L Final  . Total Bilirubin 07/12/2015 0.5  0.3 - 1.2 mg/dL Final  . GFR calc non Af Amer 07/12/2015 55* >60 mL/min Final  . GFR calc Af Amer 07/12/2015 >60  >60 mL/min Final   Comment: (NOTE) The eGFR has been calculated using the CKD EPI equation. This calculation has not been validated in all clinical situations. eGFR's persistently <60 mL/min signify possible Chronic Kidney Disease.   Georgiann Hahn gap 07/12/2015 4* 5 - 15 Final    Assessment:  [name] is a 82 y.o. female with JAK2+ polycythemia rubra vera (PV) diagnosed in 2008. She has undergone low phlebotomies x 2-3 to maintain a target hematocrit is 40-42.5.   She is on hydroxyurea 1 pill Monday, Wednesday, and Friday alternating with the following week on Monday, Wednesday, Friday, and Saturday. Screening for von Willebrand and a platelet function was normal. She is on a baby aspirin. She has been on Plavix since her TIA/CVA.   She has a history of iron deficiency anemia status post GI evaluation. Colonoscopy in 07/2007 was negative. For persistent recurrent iron deficiency anemia, she underwent EGD and colonoscopy in 12/2011 and 09/11/2013.  She has a history of carcinoid tumor after presenting with a partial small bowel obstruction.  Abdominal and pelvic CT scan on 03/22/2015 revealed a 3 x 4.9 cm apple core lesion involving a loop of small bowel in the right mid abdomen. There was associated 4 x 1.8 cm adjacent mesenteric implant. Ileocolectomy on 03/28/2015 revealed a grade I neuroendocrine tumor (carcinoid) involving the terminal ileum and right colon.  Largest tumor was 2.5 cm. There were multiple adjacent nodules of a neuroendocrine tumor. Metastasis was in 9 of 12 lymph nodes.  Pathologic stage was T3N1.  Chromogranin was 4 on 05/07/2015.  24 hour urine for 5HIAA was 5.2 mg/24 hours (0-14.9).  Octreotide scan on 05/31/2015 revealed no evidence of metastatic disease.  There was an indeterminate 9 mm nodule in the left apex.  Chest CT on 06/11/2015 revealed an 8 mm nodular density (spiculated on axial images) in the left apex.  Symptomatically, she is easily fatigued. Exam is unremarkable.  Hematocrit has improved post-operatively (27.5 to 35.0).  RBCs are microcytic.  She denies any bleeding.  Plan: 1.  Labs today:  CBC with diff, CMP. 2.  Review chest CT.  Discuss plan for follow-up imaging. 3.  Continue current dose of hydroxyurea. 4.  Schedule f/u chest CT in 4 months. 5.  RTC in 3 months for MD assess and labs (CBC with diff, CMP).   Lequita Asal, MD  07/12/2015, 10:31 AM

## 2015-09-30 ENCOUNTER — Encounter: Payer: Self-pay | Admitting: *Deleted

## 2015-10-03 NOTE — Discharge Instructions (Signed)

## 2015-10-07 ENCOUNTER — Ambulatory Visit
Admission: RE | Admit: 2015-10-07 | Discharge: 2015-10-07 | Disposition: A | Discharge status: Home / Self Care | Payer: Medicare Other | Source: Ambulatory Visit | Attending: Ophthalmology | Admitting: Ophthalmology

## 2015-10-07 ENCOUNTER — Ambulatory Visit: Payer: Medicare Other | Admitting: Anesthesiology

## 2015-10-07 ENCOUNTER — Encounter
Admission: RE | Disposition: A | Discharge status: Home / Self Care | Payer: Self-pay | Source: Ambulatory Visit | Attending: Ophthalmology

## 2015-10-07 DIAGNOSIS — H269 Unspecified cataract: Secondary | ICD-10-CM | POA: Diagnosis present

## 2015-10-07 DIAGNOSIS — Z8673 Personal history of transient ischemic attack (TIA), and cerebral infarction without residual deficits: Secondary | ICD-10-CM | POA: Insufficient documentation

## 2015-10-07 DIAGNOSIS — Z87891 Personal history of nicotine dependence: Secondary | ICD-10-CM | POA: Diagnosis not present

## 2015-10-07 DIAGNOSIS — I739 Peripheral vascular disease, unspecified: Secondary | ICD-10-CM | POA: Diagnosis not present

## 2015-10-07 DIAGNOSIS — Z9049 Acquired absence of other specified parts of digestive tract: Secondary | ICD-10-CM | POA: Diagnosis not present

## 2015-10-07 DIAGNOSIS — Z85828 Personal history of other malignant neoplasm of skin: Secondary | ICD-10-CM | POA: Insufficient documentation

## 2015-10-07 DIAGNOSIS — M1990 Unspecified osteoarthritis, unspecified site: Secondary | ICD-10-CM | POA: Diagnosis not present

## 2015-10-07 DIAGNOSIS — Z888 Allergy status to other drugs, medicaments and biological substances status: Secondary | ICD-10-CM | POA: Insufficient documentation

## 2015-10-07 DIAGNOSIS — I10 Essential (primary) hypertension: Secondary | ICD-10-CM | POA: Insufficient documentation

## 2015-10-07 DIAGNOSIS — D45 Polycythemia vera: Secondary | ICD-10-CM | POA: Insufficient documentation

## 2015-10-07 DIAGNOSIS — Z90710 Acquired absence of both cervix and uterus: Secondary | ICD-10-CM | POA: Insufficient documentation

## 2015-10-07 DIAGNOSIS — E7800 Pure hypercholesterolemia, unspecified: Secondary | ICD-10-CM | POA: Diagnosis not present

## 2015-10-07 DIAGNOSIS — Z85038 Personal history of other malignant neoplasm of large intestine: Secondary | ICD-10-CM | POA: Diagnosis not present

## 2015-10-07 DIAGNOSIS — E119 Type 2 diabetes mellitus without complications: Secondary | ICD-10-CM | POA: Insufficient documentation

## 2015-10-07 HISTORY — PX: CATARACT EXTRACTION W/PHACO: SHX586

## 2015-10-07 HISTORY — DX: Unspecified osteoarthritis, unspecified site: M19.90

## 2015-10-07 HISTORY — DX: Asymptomatic varicose veins of unspecified lower extremity: I83.90

## 2015-10-07 LAB — GLUCOSE, CAPILLARY
Glucose-Capillary: 160 mg/dL — ABNORMAL HIGH (ref 65–99)
Glucose-Capillary: 158 mg/dL — ABNORMAL HIGH (ref 65–99)

## 2015-10-07 SURGERY — PHACOEMULSIFICATION, CATARACT, WITH IOL INSERTION
Anesthesia: Monitor Anesthesia Care | Laterality: Left | Wound class: Clean

## 2015-10-07 MED ORDER — LIDOCAINE HCL (PF) 4 % IJ SOLN
INTRAMUSCULAR | Status: DC | PRN
  Administered 2015-10-07: 1 mL via OPHTHALMIC

## 2015-10-07 MED ORDER — TIMOLOL MALEATE 0.5 % OP SOLN
OPHTHALMIC | Status: DC | PRN
  Administered 2015-10-07: 1 [drp] via OPHTHALMIC

## 2015-10-07 MED ORDER — FENTANYL CITRATE (PF) 100 MCG/2ML IJ SOLN
INTRAMUSCULAR | Status: DC | PRN
  Administered 2015-10-07: 50 ug via INTRAVENOUS

## 2015-10-07 MED ORDER — LACTATED RINGERS IV SOLN: INTRAVENOUS | Status: DC

## 2015-10-07 MED ORDER — CEFUROXIME OPHTHALMIC INJECTION 1 MG/0.1 ML
INJECTION | OPHTHALMIC | Status: DC | PRN
  Administered 2015-10-07: 0.1 mL via INTRACAMERAL

## 2015-10-07 MED ORDER — ARMC OPHTHALMIC DILATING GEL
1.0000 "application " | OPHTHALMIC | Status: DC | PRN
  Administered 2015-10-07 (×2): 1 via OPHTHALMIC

## 2015-10-07 MED ORDER — POVIDONE-IODINE 5 % OP SOLN
1.0000 "application " | OPHTHALMIC | Status: DC | PRN
  Administered 2015-10-07: 1 via OPHTHALMIC

## 2015-10-07 MED ORDER — BRIMONIDINE TARTRATE 0.2 % OP SOLN
OPHTHALMIC | Status: DC | PRN
  Administered 2015-10-07: 1 [drp] via OPHTHALMIC

## 2015-10-07 MED ORDER — MIDAZOLAM HCL 2 MG/2ML IJ SOLN
INTRAMUSCULAR | Status: DC | PRN
  Administered 2015-10-07: 2 mg via INTRAVENOUS

## 2015-10-07 MED ORDER — TRYPAN BLUE 0.06 % OP SOLN
OPHTHALMIC | Status: DC | PRN
  Administered 2015-10-07: 0.5 mL via INTRAOCULAR

## 2015-10-07 MED ORDER — BSS IO SOLN
INTRAOCULAR | Status: DC | PRN
  Administered 2015-10-07: 106 mL via OPHTHALMIC

## 2015-10-07 MED ORDER — TETRACAINE HCL 0.5 % OP SOLN
1.0000 [drp] | OPHTHALMIC | Status: DC | PRN
  Administered 2015-10-07: 1 [drp] via OPHTHALMIC

## 2015-10-07 SURGICAL SUPPLY — 29 items
APPLICATOR COTTON TIP 3IN (MISCELLANEOUS) ×3 IMPLANT
CANNULA ANT/CHMB 27GA (MISCELLANEOUS) ×3 IMPLANT
DISSECTOR HYDRO NUCLEUS 50X22 (MISCELLANEOUS) ×3 IMPLANT
GLOVE BIO SURGEON STRL SZ7 (GLOVE) ×3 IMPLANT
GLOVE SURG LX 6.5 MICRO (GLOVE) ×2
GLOVE SURG LX STRL 6.5 MICRO (GLOVE) ×1 IMPLANT
GOWN STRL REUS W/ TWL LRG LVL3 (GOWN DISPOSABLE) ×2 IMPLANT
GOWN STRL REUS W/TWL LRG LVL3 (GOWN DISPOSABLE) ×4
LENS IOL TECNIS 19.5 (Intraocular Lens) ×3 IMPLANT
LENS IOL TECNIS MONO 1P 19.5 (Intraocular Lens) ×1 IMPLANT
MARKER SKIN DUAL TIP RULER LAB (MISCELLANEOUS) ×3 IMPLANT
NEEDLE FILTER BLUNT 18X 1/2SAF (NEEDLE) ×2
NEEDLE FILTER BLUNT 18X1 1/2 (NEEDLE) ×1 IMPLANT
PACK CATARACT BRASINGTON (MISCELLANEOUS) ×3 IMPLANT
PACK EYE AFTER SURG (MISCELLANEOUS) ×3 IMPLANT
PACK OPTHALMIC (MISCELLANEOUS) ×3 IMPLANT
RING MALYGIN 7.0 (MISCELLANEOUS) IMPLANT
SOL BAL SALT 15ML (MISCELLANEOUS)
SOLUTION BAL SALT 15ML (MISCELLANEOUS) IMPLANT
SUT ETHILON 10-0 CS-B-6CS-B-6 (SUTURE)
SUT VICRYL  9 0 (SUTURE)
SUT VICRYL 9 0 (SUTURE) IMPLANT
SUTURE EHLN 10-0 CS-B-6CS-B-6 (SUTURE) IMPLANT
SYR 3ML LL SCALE MARK (SYRINGE) ×3 IMPLANT
SYR TB 1ML LUER SLIP (SYRINGE) ×3 IMPLANT
WATER STERILE IRR 250ML POUR (IV SOLUTION) ×3 IMPLANT
WATER STERILE IRR 500ML POUR (IV SOLUTION) IMPLANT
WICK EYE OCUCEL (MISCELLANEOUS) IMPLANT
WIPE NON LINTING 3.25X3.25 (MISCELLANEOUS) ×3 IMPLANT

## 2015-10-07 NOTE — Anesthesia Postprocedure Evaluation (Signed)
Anesthesia Post Note  Patient: [name]  Procedure(s) Performed: Procedure(s) (LRB): CATARACT EXTRACTION PHACO AND INTRAOCULAR LENS PLACEMENT (IOC) (Left)  Patient location during evaluation: PACU Anesthesia Type: MAC Level of consciousness: awake and alert Pain management: pain level controlled Vital Signs Assessment: post-procedure vital signs reviewed and stable Respiratory status: spontaneous breathing, nonlabored ventilation, respiratory function stable and patient connected to nasal cannula oxygen Cardiovascular status: stable and blood pressure returned to baseline Anesthetic complications: no    Duran Ohern C

## 2015-10-07 NOTE — Op Note (Addendum)
Date of Surgery: 10/07/2015  PREOPERATIVE DIAGNOSES: Visually significant cortical cataract, left eye.  POSTOPERATIVE DIAGNOSES: Same  PROCEDURES PERFORMED: Cataract extraction with intraocular lens implant, left eye with the aid of vision blue dye.  SURGEON: Almon Hercules, M.D.  ANESTHESIA: MAC and topical  IMPLANTS: [id number] +19.5 D   Implant Name Type Inv. Item Serial No. Manufacturer Lot No. LRB No. Used  LENS IOL TECNIS 19.5 - [id number] Intraocular Lens LENS IOL TECNIS 19.5 [id number] AMO   Left 1    COMPLICATIONS: None.  DESCRIPTION OF PROCEDURE: Therapeutic options were discussed with the patient preoperatively, including a discussion of risks and benefits of surgery. Informed consent was obtained. An IOL-Master and immersion biometry were used to take the lens measurements, and a dilated fundus exam was performed within 6 months of the surgical date.  The patient was premedicated and brought to the operating room and placed on the operating table in the supine position. After adequate anesthesia, the patient was prepped and draped in the usual sterile ophthalmic fashion. A wire lid speculum was inserted and the microscope was positioned. A Superblade was used to create a paracentesis site at the limbus and a small amount of dilute preservative free lidocaine was instilled into the anterior chamber, then vision blue dye, followed by dispersive viscoelastic. A clear corneal incision was created temporally using a 2.4 mm keratome blade. Capsulorrhexis was then performed. In situ phacoemulsification was performed.  Cortical material was removed with the irrigation-aspiration unit. Dispersive viscoelastic was instilled to open the capsular bag. A posterior chamber intraocular lens with the specifications above was inserted and positioned. Irrigation-aspiration was used to remove all viscoelastic. Cefuroxime 1cc was instilled into the anterior chamber, and the corneal incision was checked and  found to be water tight. The eyelid speculum was removed.  The operative eye was covered with protective goggles after instilling 1 drop of timolol and brimonidine. The patient tolerated the procedure well. There were no complications.

## 2015-10-07 NOTE — Anesthesia Procedure Notes (Signed)
Procedure Name: MAC Performed by: Shakirah Kirkey Pre-anesthesia Checklist: Patient identified, Emergency Drugs available, Suction available, Timeout performed and Patient being monitored Patient Re-evaluated:Patient Re-evaluated prior to inductionOxygen Delivery Method: Nasal cannula Placement Confirmation: positive ETCO2     

## 2015-10-07 NOTE — Transfer of Care (Signed)
Immediate Anesthesia Transfer of Care Note  Patient: [name]  Procedure(s) Performed: Procedure(s) with comments: CATARACT EXTRACTION PHACO AND INTRAOCULAR LENS PLACEMENT (IOC) (Left) - DIABETIC - oral meds VISION BLUE  Patient Location: PACU  Anesthesia Type: MAC  Level of Consciousness: awake, alert  and patient cooperative  Airway and Oxygen Therapy: Patient Spontanous Breathing and Patient connected to supplemental oxygen  Post-op Assessment: Post-op Vital signs reviewed, Patient's Cardiovascular Status Stable, Respiratory Function Stable, Patent Airway and No signs of Nausea or vomiting  Post-op Vital Signs: Reviewed and stable  Complications: No apparent anesthesia complications

## 2015-10-07 NOTE — H&P (Signed)
H+P reviewed and is up to date, please see paper chart.  

## 2015-10-07 NOTE — Anesthesia Preprocedure Evaluation (Signed)
Anesthesia Evaluation    Airway Mallampati: II  TM Distance: >3 FB Neck ROM: Full    Dental no notable dental hx.    Pulmonary former smoker,    Pulmonary exam normal breath sounds clear to auscultation       Cardiovascular hypertension, + Peripheral Vascular Disease  Normal cardiovascular exam Rhythm:Regular Rate:Normal     Neuro/Psych CVA, Residual Symptoms    GI/Hepatic   Endo/Other  diabetes, Type 2  Renal/GU      Musculoskeletal  (+) Arthritis ,   Abdominal   Peds  Hematology   Anesthesia Other Findings   Reproductive/Obstetrics                             Anesthesia Physical Anesthesia Plan  ASA: II  Anesthesia Plan: MAC   Post-op Pain Management:    Induction: Intravenous  Airway Management Planned:   Additional Equipment:   Intra-op Plan:   Post-operative Plan: Extubation in OR  Informed Consent: I have reviewed the patients History and Physical, chart, labs and discussed the procedure including the risks, benefits and alternatives for the proposed anesthesia with the patient or authorized representative who has indicated his/her understanding and acceptance.   Dental advisory given  Plan Discussed with: CRNA  Anesthesia Plan Comments:         Anesthesia Quick Evaluation

## 2015-10-08 ENCOUNTER — Encounter: Payer: Self-pay | Admitting: Ophthalmology

## 2015-10-10 ENCOUNTER — Ambulatory Visit
Admission: RE | Admit: 2015-10-10 | Discharge: 2015-10-10 | Disposition: A | Discharge status: Home / Self Care | Payer: Medicare Other | Source: Ambulatory Visit | Attending: Hematology and Oncology | Admitting: Hematology and Oncology

## 2015-10-10 ENCOUNTER — Ambulatory Visit: Payer: Medicare Other | Admitting: Hematology and Oncology

## 2015-10-10 ENCOUNTER — Other Ambulatory Visit: Payer: Medicare Other

## 2015-10-10 DIAGNOSIS — I2510 Atherosclerotic heart disease of native coronary artery without angina pectoris: Secondary | ICD-10-CM | POA: Insufficient documentation

## 2015-10-10 DIAGNOSIS — R911 Solitary pulmonary nodule: Secondary | ICD-10-CM | POA: Insufficient documentation

## 2015-10-10 DIAGNOSIS — J439 Emphysema, unspecified: Secondary | ICD-10-CM | POA: Diagnosis not present

## 2015-10-14 ENCOUNTER — Inpatient Hospital Stay: Payer: Medicare Other | Attending: Hematology and Oncology

## 2015-10-14 ENCOUNTER — Inpatient Hospital Stay (HOSPITAL_BASED_OUTPATIENT_CLINIC_OR_DEPARTMENT_OTHER): Payer: Medicare Other | Admitting: Hematology and Oncology

## 2015-10-14 VITALS — BP 126/68 | HR 73 | Temp 96.9°F | Resp 18 | Wt 136.1 lb

## 2015-10-14 DIAGNOSIS — Z853 Personal history of malignant neoplasm of breast: Secondary | ICD-10-CM | POA: Insufficient documentation

## 2015-10-14 DIAGNOSIS — Z806 Family history of leukemia: Secondary | ICD-10-CM | POA: Insufficient documentation

## 2015-10-14 DIAGNOSIS — Z8673 Personal history of transient ischemic attack (TIA), and cerebral infarction without residual deficits: Secondary | ICD-10-CM | POA: Insufficient documentation

## 2015-10-14 DIAGNOSIS — K5900 Constipation, unspecified: Secondary | ICD-10-CM | POA: Diagnosis not present

## 2015-10-14 DIAGNOSIS — R809 Proteinuria, unspecified: Secondary | ICD-10-CM

## 2015-10-14 DIAGNOSIS — M7060 Trochanteric bursitis, unspecified hip: Secondary | ICD-10-CM | POA: Diagnosis not present

## 2015-10-14 DIAGNOSIS — I10 Essential (primary) hypertension: Secondary | ICD-10-CM | POA: Diagnosis not present

## 2015-10-14 DIAGNOSIS — E119 Type 2 diabetes mellitus without complications: Secondary | ICD-10-CM | POA: Diagnosis not present

## 2015-10-14 DIAGNOSIS — Z85828 Personal history of other malignant neoplasm of skin: Secondary | ICD-10-CM | POA: Diagnosis not present

## 2015-10-14 DIAGNOSIS — Z8542 Personal history of malignant neoplasm of other parts of uterus: Secondary | ICD-10-CM | POA: Insufficient documentation

## 2015-10-14 DIAGNOSIS — D509 Iron deficiency anemia, unspecified: Secondary | ICD-10-CM | POA: Diagnosis not present

## 2015-10-14 DIAGNOSIS — Z7982 Long term (current) use of aspirin: Secondary | ICD-10-CM

## 2015-10-14 DIAGNOSIS — Z8781 Personal history of (healed) traumatic fracture: Secondary | ICD-10-CM | POA: Insufficient documentation

## 2015-10-14 DIAGNOSIS — M1990 Unspecified osteoarthritis, unspecified site: Secondary | ICD-10-CM

## 2015-10-14 DIAGNOSIS — Z87891 Personal history of nicotine dependence: Secondary | ICD-10-CM

## 2015-10-14 DIAGNOSIS — R911 Solitary pulmonary nodule: Secondary | ICD-10-CM

## 2015-10-14 DIAGNOSIS — R918 Other nonspecific abnormal finding of lung field: Secondary | ICD-10-CM | POA: Insufficient documentation

## 2015-10-14 DIAGNOSIS — C7A00 Malignant carcinoid tumor of unspecified site: Secondary | ICD-10-CM

## 2015-10-14 DIAGNOSIS — Z8541 Personal history of malignant neoplasm of cervix uteri: Secondary | ICD-10-CM | POA: Insufficient documentation

## 2015-10-14 DIAGNOSIS — Z85060 Personal history of malignant carcinoid tumor of small intestine: Secondary | ICD-10-CM | POA: Diagnosis not present

## 2015-10-14 DIAGNOSIS — D45 Polycythemia vera: Secondary | ICD-10-CM | POA: Diagnosis not present

## 2015-10-14 LAB — COMPREHENSIVE METABOLIC PANEL
ALT: 13 U/L — ABNORMAL LOW (ref 14–54)
AST: 20 U/L (ref 15–41)
Albumin: 4.3 g/dL (ref 3.5–5.0)
Alkaline Phosphatase: 39 U/L (ref 38–126)
Anion gap: 6 (ref 5–15)
BUN: 27 mg/dL — ABNORMAL HIGH (ref 6–20)
CO2: 27 mmol/L (ref 22–32)
Calcium: 8.9 mg/dL (ref 8.9–10.3)
Chloride: 100 mmol/L — ABNORMAL LOW (ref 101–111)
Creatinine, Ser: 1.13 mg/dL — ABNORMAL HIGH (ref 0.44–1.00)
GFR calc Af Amer: 51 mL/min — ABNORMAL LOW (ref >60)
GFR calc non Af Amer: 44 mL/min — ABNORMAL LOW (ref >60)
Glucose, Bld: 177 mg/dL — ABNORMAL HIGH (ref 65–99)
Potassium: 3.8 mmol/L (ref 3.5–5.1)
Sodium: 133 mmol/L — ABNORMAL LOW (ref 135–145)
Total Bilirubin: 0.6 mg/dL (ref 0.3–1.2)
Total Protein: 7 g/dL (ref 6.5–8.1)

## 2015-10-14 LAB — CBC WITH DIFFERENTIAL/PLATELET
Basophils Absolute: 0 10*3/uL (ref 0–0.1)
Basophils Relative: 0 %
Eosinophils Absolute: 0.4 10*3/uL (ref 0–0.7)
Eosinophils Relative: 3 %
HCT: 35.4 % (ref 35.0–47.0)
Hemoglobin: 11 g/dL — ABNORMAL LOW (ref 12.0–16.0)
Lymphocytes Relative: 10 %
Lymphs Abs: 1.3 10*3/uL (ref 1.0–3.6)
MCH: 21.5 pg — ABNORMAL LOW (ref 26.0–34.0)
MCHC: 31 g/dL — ABNORMAL LOW (ref 32.0–36.0)
MCV: 69.3 fL — ABNORMAL LOW (ref 80.0–100.0)
Monocytes Absolute: 0.6 10*3/uL (ref 0.2–0.9)
Monocytes Relative: 4 %
Neutro Abs: 11.2 10*3/uL — ABNORMAL HIGH (ref 1.4–6.5)
Neutrophils Relative %: 83 %
Platelets: 531 10*3/uL — ABNORMAL HIGH (ref 150–440)
RBC: 5.1 MIL/uL (ref 3.80–5.20)
RDW: 18.8 % — ABNORMAL HIGH (ref 11.5–14.5)
WBC: 13.5 10*3/uL — ABNORMAL HIGH (ref 3.6–11.0)

## 2015-10-14 NOTE — Progress Notes (Signed)
States is here for routine follow up and to discuss recent CT scan results. Offers no complaints and is feeling well today.

## 2015-10-14 NOTE — Progress Notes (Signed)
[facility] [facility] day:  10/14/2015  Chief Complaint: [name] is a 82 y.o. 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  HPI:  The patient was last seen in the medical oncology clinic on 07/12/2015.  At that time, CBC revealed a hematocrit of 35.0, hemoglobin 10.5, MCV 69.9, platelets 296,000, white count 13,200 with an ANC of 10,700. No change was made in her hydroxyurea dosing. Chest CT on 06/11/2015 revealed an 8 mm nodular density in the left apex.  Chest CT on 10/10/2015 revealed a stable 8 mm pulmonary nodule in the anterior left lung apex.  Follow-up was recommended in 6-8 months.  During the interim, she underwent left cataract surgery 10/07/2015. She has had some constipation for which he uses MiraLAX. She feels bloated. She denies any pain.   She is on hydroxyurea 1 pill Monday, Wednesday, and Friday alternating with the following week on Monday, Wednesday, Friday, and Saturday.   Past Medical History  Diagnosis Date  . Hypertension   . Hyperuricemia   . Diabetes mellitus without complication ([place])   . H/O TIA (transient ischemic attack) and stroke 09/2014, 03/2015    No deficits  . Adenocarcinoma in situ of cervix   . H/O compression fracture of spine 2014    thoracic spine  . Microalbuminuria   . Trochanteric bursitis   . H/O polycythemia vera   . Recurrent falls   . Stroke [facility]) 2008    no deficits  . Varicose veins     treated  . Arthritis     hands  . Polycythemia vera ([place])   . Endometrial carcinoma (HCC)     s/p total abdominal hysterectomy  . Breast cancer ([place])   . Skin cancer     face, legs  . Colon cancer [facility])     Past Surgical History  Procedure Laterality Date  . Tonsillectomy    . Exploratory laparotomy      for fibroids  . Cataract extraction w/ intraocular lens implant Right   . Abdominal  hysterectomy    . Bowel resection N/A 03/28/2015    Procedure: SMALL BOWEL RESECTION;  Surgeon: Leonie Green, MD;  Location: [facility] ORS;  Service: General;  Laterality: N/A;  . Cataract extraction w/phaco Left 10/07/2015    Procedure: CATARACT EXTRACTION PHACO AND INTRAOCULAR LENS PLACEMENT ([place]);  Surgeon: Ronnell Freshwater, MD;  Location: [place];  Service: Ophthalmology;  Laterality: Left;  DIABETIC - oral meds VISION BLUE    Family History  Problem Relation Age of Onset  . Heart attack Mother   . Breast cancer Mother   . Heart attack Father   . Heart attack Sister   . Diabetes Sister   . Cancer Brother     AML  Her brother died of AML.  Social History:  reports that she has quit smoking. She has never used smokeless tobacco. She reports that she does not drink alcohol or use illicit drugs.  The patient's husband died of AML.  She has no family in [place]. 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 The patient is alone today.  Allergies:  Allergies  Allergen Reactions  . Simvastatin     Other reaction(s): Muscle Pain    Current Medications: Current Outpatient Prescriptions  Medication Sig Dispense Refill  . aspirin EC 81 MG tablet Take 81 mg  by mouth daily.    . calcium-vitamin D (OSCAL WITH D) 500-200 MG-UNIT per tablet Take 1 tablet by mouth daily.     . carvedilol (COREG) 6.25 MG tablet Take 6.25 mg by mouth 2 (two) times daily with a meal.    . chlorthalidone (HYGROTON) 25 MG tablet Take 25 mg by mouth daily.    . citalopram (CELEXA) 10 MG tablet Take 10 mg by mouth daily.    . clopidogrel (PLAVIX) 75 MG tablet Take 1 tablet (75 mg total) by mouth daily. 30 tablet 0  . fexofenadine (ALLEGRA) 180 MG tablet Take 180 mg by mouth daily as needed. Reported on 10/07/2015    . hydrALAZINE (APRESOLINE) 50 MG tablet Take 1 tablet (50 mg total) by mouth every 8 (eight) hours. 90 tablet 0  . hydroxyurea (HYDREA) 500 MG capsule Take 500 mg by  mouth daily. 1 tab on Mon, Wed, Fri one week and then the next week Mon, Wed, Fri, Sat    . loperamide (IMODIUM) 2 MG capsule Take 4 mg by mouth as needed for diarrhea or loose stools.    . metFORMIN (GLUCOPHAGE) 1000 MG tablet Take 1,000 mg by mouth 2 (two) times daily.     . polyethylene glycol (MIRALAX / GLYCOLAX) packet Take 17 g by mouth daily as needed.    . potassium chloride (K-DUR) 10 MEQ tablet Take 1 tablet by mouth daily.    . potassium chloride (K-DUR,KLOR-CON) 10 MEQ tablet Take 10 mEq by mouth daily.    . pravastatin (PRAVACHOL) 20 MG tablet Take 20 mg by mouth at bedtime.     . Probiotic Product (PROBIOTIC DAILY PO) Take 1 tablet by mouth daily.    . ramipril (ALTACE) 10 MG capsule Take 10 mg by mouth 2 (two) times daily.     . verapamil (CALAN-SR) 240 MG CR tablet Take 240 mg by mouth daily.     No current facility-administered medications for this visit.    Review of Systems:  GENERAL: Feels "ok". No fevers or sweats.  Weight down 1 pound. PERFORMANCE STATUS (ECOG): 1 HEENT: Interval left cataract surgery.  No visual changes, runny nose, sore throat, mouth sores or tenderness. Lungs: No shortness of breath or cough. No hemoptysis. Cardiac: No chest pain, palpitations, orthopnea, or PND. GI: Constipation, taking Miralax. Bloated.  No nausea, vomiting, diarrhea, melena or hematochezia. GU: No urgency, frequency, dysuria, or hematuria. Musculoskeletal: No back pain. No joint pain. No muscle tenderness. Extremities: No pain or swelling. Skin: No rashes or skin changes. Neuro: No headache, numbness or weakness, balance or coordination issues. Endocrine: Diabetes.  No thyroid issues, hot flashes or night sweats. Psych: No mood changes, depression or anxiety. Pain: No focal pain. Review of systems: All other systems reviewed and found to be negative.  Physical Exam: Blood pressure 126/68, pulse 73, temperature 96.9 F (36.1 C), temperature source  Tympanic, resp. rate 18, weight 136 lb 2.1 oz (61.75 kg).  GENERAL: Well developed, well nourished, woman sitting comfortably in the exam room in no acute distress MENTAL STATUS: Alert and oriented to person, place and time. HEAD: Short styled dark blonde hair. Normocephalic, atraumatic, face symmetric, no Cushingoid features. EYES: Glasses. Blue eyes. Pupils equal round and reactive to light and accomodation. No conjunctivitis or scleral icterus. ENT: Oropharynx clear without lesion. Tongue normal. Mucous membranes moist.  RESPIRATORY: Clear to auscultation without rales, wheezes or rhonchi. CARDIOVASCULAR: Regular rate and rhythm without murmur, rub or gallop. ABDOMEN: Long ventral hernia.  Soft, non-tender,  with active bowel sounds, and no hepatosplenomegaly. No masses. SKIN: No rashes, ulcers or lesions. EXTREMITIES: No edema, no skin discoloration or tenderness. No palpable cords. LYMPH NODES: No palpable cervical, supraclavicular, axillary or inguinal adenopathy  NEUROLOGICAL: Unremarkable. PSYCH: Appropriate.   Appointment on 10/14/2015  Component Date Value Ref Range Status  . WBC 10/14/2015 13.5* 3.6 - 11.0 K/uL Final  . RBC 10/14/2015 5.10  3.80 - 5.20 MIL/uL Final  . Hemoglobin 10/14/2015 11.0* 12.0 - 16.0 g/dL Final  . HCT 10/14/2015 35.4  35.0 - 47.0 % Final  . MCV 10/14/2015 69.3* 80.0 - 100.0 fL Final  . MCH 10/14/2015 21.5* 26.0 - 34.0 pg Final  . MCHC 10/14/2015 31.0* 32.0 - 36.0 g/dL Final  . RDW 10/14/2015 18.8* 11.5 - 14.5 % Final  . Platelets 10/14/2015 531* 150 - 440 K/uL Final  . Neutrophils Relative % 10/14/2015 83   Final  . Neutro Abs 10/14/2015 11.2* 1.4 - 6.5 K/uL Final  . Lymphocytes Relative 10/14/2015 10   Final  . Lymphs Abs 10/14/2015 1.3  1.0 - 3.6 K/uL Final  . Monocytes Relative 10/14/2015 4   Final  . Monocytes Absolute 10/14/2015 0.6  0.2 - 0.9 K/uL Final  . Eosinophils Relative 10/14/2015 3   Final  . Eosinophils Absolute  10/14/2015 0.4  0 - 0.7 K/uL Final  . Basophils Relative 10/14/2015 0   Final  . Basophils Absolute 10/14/2015 0.0  0 - 0.1 K/uL Final  . Sodium 10/14/2015 133* 135 - 145 mmol/L Final  . Potassium 10/14/2015 3.8  3.5 - 5.1 mmol/L Final  . Chloride 10/14/2015 100* 101 - 111 mmol/L Final  . CO2 10/14/2015 27  22 - 32 mmol/L Final  . Glucose, Bld 10/14/2015 177* 65 - 99 mg/dL Final  . BUN 10/14/2015 27* 6 - 20 mg/dL Final  . Creatinine, Ser 10/14/2015 1.13* 0.44 - 1.00 mg/dL Final  . Calcium 10/14/2015 8.9  8.9 - 10.3 mg/dL Final  . Total Protein 10/14/2015 7.0  6.5 - 8.1 g/dL Final  . Albumin 10/14/2015 4.3  3.5 - 5.0 g/dL Final  . AST 10/14/2015 20  15 - 41 U/L Final  . ALT 10/14/2015 13* 14 - 54 U/L Final  . Alkaline Phosphatase 10/14/2015 39  38 - 126 U/L Final  . Total Bilirubin 10/14/2015 0.6  0.3 - 1.2 mg/dL Final  . GFR calc non Af Amer 10/14/2015 44* >60 mL/min Final  . GFR calc Af Amer 10/14/2015 51* >60 mL/min Final   Comment: (NOTE) The eGFR has been calculated using the CKD EPI equation. This calculation has not been validated in all clinical situations. eGFR's persistently <60 mL/min signify possible Chronic Kidney Disease.   [name] gap 10/14/2015 6  5 - 15 Final    Assessment:  [name] is a 82 y.o. female with JAK2+ polycythemia rubra vera (PV) diagnosed in 2008. She has undergone low phlebotomies x 2-3 to maintain a target hematocrit is 40-42.5.   She is on hydroxyurea 1 pill Monday, Wednesday, and Friday alternating with the following week on Monday, Wednesday, Friday, and Saturday. Screening for von Willebrand and a platelet function was normal. She is on a baby aspirin. She has been on Plavix since her TIA/CVA.   She has a history of iron deficiency anemia status post GI evaluation. Colonoscopy in 07/2007 was negative. For persistent recurrent iron deficiency anemia, she underwent EGD and colonoscopy in 12/2011 and 09/11/2013.  She has a history of  carcinoid tumor after presenting  with a partial small bowel obstruction.  Abdominal and pelvic CT scan on 03/22/2015 revealed a 3 x 4.9 cm apple core lesion involving a loop of small bowel in the right mid abdomen. There was associated 4 x 1.8 cm adjacent mesenteric implant. Ileocolectomy on 03/28/2015 revealed a grade I neuroendocrine tumor (carcinoid) involving the terminal ileum and right colon.  Largest tumor was 2.5 cm. There were multiple adjacent nodules of a neuroendocrine tumor. Metastasis was in 9 of 12 lymph nodes.  Pathologic stage was T3N1.  Chromogranin was 4 on 05/07/2015.   24 hour urine for 5HIAA was 5.2 mg/24 hours (0-14.9).  Octreotide scan on 05/31/2015 revealed no evidence of metastatic disease.  There was an indeterminate 9 mm nodule in the left apex.    Chest CT on 06/11/2015 revealed an 8 mm nodular density (spiculated on axial images) in the left apex. Chest CT on 10/10/2015 revealed a stable 8 mm pulmonary nodule in the anterior left lung apex.  Follow-up was recommended in 6-8 months.  Symptomatically, she has had issues with constipation. Exam is unremarkable.  Hematocrit is good (35.4).  RBCs are microcytic.  Platelet count is high (531,000) and above goal 400,000. She denies any bleeding.  Plan: 1.  Labs today:  CBC with diff, CMP. 2.  Review interval chest CT.  Per radiology, follow-up in 6-8 months. 3.  Increase hydroxyurea to 1 tablet every Monday, Wednesday, Friday, and Saturday. 4.  RTC in 1 month for CBC with diff. 5.  RTC in 3 months for MD assess and labs (CBC with diff, CMP).   Lequita Asal, MD  10/14/2015, 11:52 AM

## 2015-10-14 NOTE — Patient Instructions (Signed)
Pt has been advised increase dose of  Hydrea to  1 capsule every mon, wed, Fri, and Sat of every week.

## 2015-10-20 ENCOUNTER — Encounter: Payer: Self-pay | Admitting: Hematology and Oncology

## 2015-11-13 ENCOUNTER — Inpatient Hospital Stay: Payer: Medicare Other | Attending: Hematology and Oncology

## 2015-11-13 DIAGNOSIS — C7A00 Malignant carcinoid tumor of unspecified site: Secondary | ICD-10-CM

## 2015-11-13 DIAGNOSIS — D45 Polycythemia vera: Secondary | ICD-10-CM

## 2015-11-13 LAB — CBC WITH DIFFERENTIAL/PLATELET
Basophils Absolute: 0.3 10*3/uL — ABNORMAL HIGH (ref 0–0.1)
Basophils Relative: 2 %
Eosinophils Absolute: 0.4 10*3/uL (ref 0–0.7)
Eosinophils Relative: 3 %
HCT: 34.6 % — ABNORMAL LOW (ref 35.0–47.0)
Hemoglobin: 10.9 g/dL — ABNORMAL LOW (ref 12.0–16.0)
Lymphocytes Relative: 10 %
Lymphs Abs: 1.1 10*3/uL (ref 1.0–3.6)
MCH: 22.2 pg — ABNORMAL LOW (ref 26.0–34.0)
MCHC: 31.5 g/dL — ABNORMAL LOW (ref 32.0–36.0)
MCV: 70.5 fL — ABNORMAL LOW (ref 80.0–100.0)
Monocytes Absolute: 0.6 10*3/uL (ref 0.2–0.9)
Monocytes Relative: 5 %
Neutro Abs: 9.5 10*3/uL — ABNORMAL HIGH (ref 1.4–6.5)
Neutrophils Relative %: 80 %
Platelets: 392 10*3/uL (ref 150–440)
RBC: 4.91 MIL/uL (ref 3.80–5.20)
RDW: 18.5 % — ABNORMAL HIGH (ref 11.5–14.5)
WBC: 11.8 10*3/uL — ABNORMAL HIGH (ref 3.6–11.0)

## 2016-01-07 ENCOUNTER — Other Ambulatory Visit: Payer: Self-pay | Admitting: Hematology and Oncology

## 2016-01-13 ENCOUNTER — Inpatient Hospital Stay: Payer: Medicare Other | Attending: Hematology and Oncology | Admitting: Hematology and Oncology

## 2016-01-13 ENCOUNTER — Inpatient Hospital Stay (HOSPITAL_BASED_OUTPATIENT_CLINIC_OR_DEPARTMENT_OTHER): Payer: Medicare Other

## 2016-01-13 VITALS — BP 108/66 | HR 75 | Temp 98.3°F | Resp 18 | Wt 136.6 lb

## 2016-01-13 DIAGNOSIS — Z85038 Personal history of other malignant neoplasm of large intestine: Secondary | ICD-10-CM

## 2016-01-13 DIAGNOSIS — R809 Proteinuria, unspecified: Secondary | ICD-10-CM | POA: Diagnosis not present

## 2016-01-13 DIAGNOSIS — Z806 Family history of leukemia: Secondary | ICD-10-CM | POA: Diagnosis not present

## 2016-01-13 DIAGNOSIS — Z8673 Personal history of transient ischemic attack (TIA), and cerebral infarction without residual deficits: Secondary | ICD-10-CM | POA: Diagnosis not present

## 2016-01-13 DIAGNOSIS — Z8781 Personal history of (healed) traumatic fracture: Secondary | ICD-10-CM | POA: Diagnosis not present

## 2016-01-13 DIAGNOSIS — D45 Polycythemia vera: Secondary | ICD-10-CM | POA: Diagnosis present

## 2016-01-13 DIAGNOSIS — Z7984 Long term (current) use of oral hypoglycemic drugs: Secondary | ICD-10-CM | POA: Insufficient documentation

## 2016-01-13 DIAGNOSIS — C7A00 Malignant carcinoid tumor of unspecified site: Secondary | ICD-10-CM

## 2016-01-13 DIAGNOSIS — K5900 Constipation, unspecified: Secondary | ICD-10-CM | POA: Diagnosis not present

## 2016-01-13 DIAGNOSIS — E790 Hyperuricemia without signs of inflammatory arthritis and tophaceous disease: Secondary | ICD-10-CM | POA: Diagnosis not present

## 2016-01-13 DIAGNOSIS — Z803 Family history of malignant neoplasm of breast: Secondary | ICD-10-CM | POA: Diagnosis not present

## 2016-01-13 DIAGNOSIS — Z79899 Other long term (current) drug therapy: Secondary | ICD-10-CM | POA: Diagnosis not present

## 2016-01-13 DIAGNOSIS — I10 Essential (primary) hypertension: Secondary | ICD-10-CM

## 2016-01-13 DIAGNOSIS — C7A012 Malignant carcinoid tumor of the ileum: Secondary | ICD-10-CM | POA: Insufficient documentation

## 2016-01-13 DIAGNOSIS — Z8542 Personal history of malignant neoplasm of other parts of uterus: Secondary | ICD-10-CM | POA: Diagnosis not present

## 2016-01-13 DIAGNOSIS — Z85828 Personal history of other malignant neoplasm of skin: Secondary | ICD-10-CM | POA: Insufficient documentation

## 2016-01-13 DIAGNOSIS — E119 Type 2 diabetes mellitus without complications: Secondary | ICD-10-CM | POA: Insufficient documentation

## 2016-01-13 DIAGNOSIS — Z7982 Long term (current) use of aspirin: Secondary | ICD-10-CM

## 2016-01-13 DIAGNOSIS — R918 Other nonspecific abnormal finding of lung field: Secondary | ICD-10-CM | POA: Diagnosis not present

## 2016-01-13 DIAGNOSIS — Z8541 Personal history of malignant neoplasm of cervix uteri: Secondary | ICD-10-CM | POA: Diagnosis not present

## 2016-01-13 DIAGNOSIS — Z853 Personal history of malignant neoplasm of breast: Secondary | ICD-10-CM | POA: Diagnosis not present

## 2016-01-13 DIAGNOSIS — Z7901 Long term (current) use of anticoagulants: Secondary | ICD-10-CM | POA: Insufficient documentation

## 2016-01-13 DIAGNOSIS — R911 Solitary pulmonary nodule: Secondary | ICD-10-CM

## 2016-01-13 DIAGNOSIS — M129 Arthropathy, unspecified: Secondary | ICD-10-CM | POA: Insufficient documentation

## 2016-01-13 DIAGNOSIS — Z9181 History of falling: Secondary | ICD-10-CM

## 2016-01-13 DIAGNOSIS — Z87891 Personal history of nicotine dependence: Secondary | ICD-10-CM | POA: Diagnosis not present

## 2016-01-13 LAB — COMPREHENSIVE METABOLIC PANEL
ALT: 13 U/L — ABNORMAL LOW (ref 14–54)
AST: 17 U/L (ref 15–41)
Albumin: 4.2 g/dL (ref 3.5–5.0)
Alkaline Phosphatase: 42 U/L (ref 38–126)
Anion gap: 7 (ref 5–15)
BUN: 27 mg/dL — ABNORMAL HIGH (ref 6–20)
CO2: 28 mmol/L (ref 22–32)
Calcium: 9.4 mg/dL (ref 8.9–10.3)
Chloride: 101 mmol/L (ref 101–111)
Creatinine, Ser: 0.98 mg/dL (ref 0.44–1.00)
GFR calc Af Amer: >60 mL/min (ref >60)
GFR calc non Af Amer: 53 mL/min — ABNORMAL LOW (ref >60)
Glucose, Bld: 196 mg/dL — ABNORMAL HIGH (ref 65–99)
Potassium: 3.7 mmol/L (ref 3.5–5.1)
Sodium: 136 mmol/L (ref 135–145)
Total Bilirubin: 0.5 mg/dL (ref 0.3–1.2)
Total Protein: 6.8 g/dL (ref 6.5–8.1)

## 2016-01-13 LAB — CBC WITH DIFFERENTIAL/PLATELET
Basophils Absolute: 0.3 10*3/uL — ABNORMAL HIGH (ref 0–0.1)
Basophils Relative: 3 %
Eosinophils Absolute: 0.4 10*3/uL (ref 0–0.7)
Eosinophils Relative: 3 %
HCT: 35.5 % (ref 35.0–47.0)
Hemoglobin: 11 g/dL — ABNORMAL LOW (ref 12.0–16.0)
Lymphocytes Relative: 10 %
Lymphs Abs: 1.2 10*3/uL (ref 1.0–3.6)
MCH: 22.6 pg — ABNORMAL LOW (ref 26.0–34.0)
MCHC: 31.1 g/dL — ABNORMAL LOW (ref 32.0–36.0)
MCV: 72.8 fL — ABNORMAL LOW (ref 80.0–100.0)
Monocytes Absolute: 0.7 10*3/uL (ref 0.2–0.9)
Monocytes Relative: 6 %
Neutro Abs: 10 10*3/uL — ABNORMAL HIGH (ref 1.4–6.5)
Neutrophils Relative %: 78 %
Platelets: 385 10*3/uL (ref 150–440)
RBC: 4.87 MIL/uL (ref 3.80–5.20)
RDW: 19.5 % — ABNORMAL HIGH (ref 11.5–14.5)
WBC: 12.7 10*3/uL — ABNORMAL HIGH (ref 3.6–11.0)

## 2016-01-13 NOTE — Progress Notes (Signed)
[place] [facility] day:  01/13/16  Chief Complaint: [name] is a 82 y.o. 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  HPI:  The patient was last seen in the medical oncology clinic on 10/14/2015.  At that time, she had issues with constipation. Exam was unremarkable.  Hematocrit was good (35.4).  RBCs were microcytic.  Platelet count was high (531,000) and above goal 400,000. She denied any bleeding.  At last visit, her hydroxyurea was increased to 1 tablet every Monday, Wednesday, Friday, and Saturday.  Follow-up CBC on 11/13/2015 included a hematocrit 34.6, hemoglobin 10.9, platelets 392,000, white count 11,800 with an ANC of 9500.  During the interim, she has felt good. She notes no complaints. She remains on aspirin and Plavix. She has constipation at times.  She continues with her same dose of hydroxyurea. She notes some bruising on her arms after her storm door hit her. Her balance has not been as good since her surgery.   Past Medical History:  Diagnosis Date  . Adenocarcinoma in situ of cervix   . Arthritis    hands  . Breast cancer ([place])   . Colon cancer ([place])   . Diabetes mellitus without complication ([place])   . Endometrial carcinoma (HCC)    s/p total abdominal hysterectomy  . H/O compression fracture of spine 2014   thoracic spine  . H/O polycythemia vera   . H/O TIA (transient ischemic attack) and stroke 09/2014, 03/2015   No deficits  . Hypertension   . Hyperuricemia   . Microalbuminuria   . Polycythemia vera ([place])   . Recurrent falls   . Skin cancer    face, legs  . Stroke [facility]) 2008   no deficits  . Trochanteric bursitis   . Varicose veins    treated    Past Surgical History:  Procedure Laterality Date  . ABDOMINAL HYSTERECTOMY    . BOWEL RESECTION N/A 03/28/2015   Procedure: SMALL BOWEL RESECTION;  Surgeon:  Leonie Green, MD;  Location: [facility] ORS;  Service: General;  Laterality: N/A;  . CATARACT EXTRACTION W/ INTRAOCULAR LENS IMPLANT Right   . CATARACT EXTRACTION W/PHACO Left 10/07/2015   Procedure: CATARACT EXTRACTION PHACO AND INTRAOCULAR LENS PLACEMENT (I[facility]);  Surgeon: Ronnell Freshwater, MD;  Location: [place];  Service: Ophthalmology;  Laterality: Left;  DIABETIC - oral meds VISION BLUE  . EXPLORATORY LAPAROTOMY     for fibroids  . TONSILLECTOMY      Family History  Problem Relation Age of Onset  . Heart attack Mother   . Breast cancer Mother   . Heart attack Father   . Heart attack Sister   . Diabetes Sister   . Cancer Brother     AML  Her brother died of AML.  Social History:  reports that she has quit smoking. She has never used smokeless tobacco. She reports that she does not drink alcohol or use drugs.  The patient's husband died of AML.  She has no family in [place]. 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 The patient is alone today.  Allergies:  Allergies  Allergen Reactions  . Simvastatin     Other reaction(s): Muscle Pain    Current Medications: Current Outpatient Prescriptions  Medication Sig Dispense Refill  . aspirin EC 81 MG tablet Take 81 mg by mouth daily.    Marland Kitchen  calcium-vitamin D (OSCAL WITH D) 500-200 MG-UNIT per tablet Take 1 tablet by mouth daily.     . carvedilol (COREG) 6.25 MG tablet Take 6.25 mg by mouth 2 (two) times daily with a meal.    . chlorthalidone (HYGROTON) 25 MG tablet Take 25 mg by mouth daily.    . citalopram (CELEXA) 10 MG tablet Take 10 mg by mouth daily.    . clopidogrel (PLAVIX) 75 MG tablet Take 1 tablet (75 mg total) by mouth daily. 30 tablet 0  . fexofenadine (ALLEGRA) 180 MG tablet Take 180 mg by mouth daily as needed. Reported on 10/07/2015    . hydrALAZINE (APRESOLINE) 50 MG tablet Take 1 tablet (50 mg total) by mouth every 8 (eight) hours. 90 tablet 0  . hydroxyurea (HYDREA) 500 MG  capsule TAKE 1 CAPSULE BY MOUTH EVERY DAY 30 capsule 1  . loperamide (IMODIUM) 2 MG capsule Take 4 mg by mouth as needed for diarrhea or loose stools.    . metFORMIN (GLUCOPHAGE) 1000 MG tablet Take 1,000 mg by mouth 2 (two) times daily.     . polyethylene glycol (MIRALAX / GLYCOLAX) packet Take 17 g by mouth daily as needed.    . potassium chloride (K-DUR) 10 MEQ tablet Take 1 tablet by mouth daily.    . potassium chloride (K-DUR,KLOR-CON) 10 MEQ tablet Take 10 mEq by mouth daily.    . pravastatin (PRAVACHOL) 20 MG tablet Take 20 mg by mouth at bedtime.     . Probiotic Product (PROBIOTIC DAILY PO) Take 1 tablet by mouth daily.    . ramipril (ALTACE) 10 MG capsule Take 10 mg by mouth 2 (two) times daily.     . verapamil (CALAN-SR) 240 MG CR tablet Take 240 mg by mouth daily.     No current facility-administered medications for this visit.     Review of Systems:  GENERAL: Feels good. No fevers or sweats.  Weight stable. PERFORMANCE STATUS (ECOG): 1 HEENT: s/p left cataract surgery.  No visual changes, runny nose, sore throat, mouth sores or tenderness. Lungs: No shortness of breath or cough. No hemoptysis. Cardiac: No chest pain, palpitations, orthopnea, or PND. GI: Constipation, taking Miralax. No nausea, vomiting, diarrhea, melena or hematochezia. GU: No urgency, frequency, dysuria, or hematuria. Musculoskeletal: No back pain. No joint pain. No muscle tenderness. Extremities: No pain or swelling. Skin: Bruise on arm after struck by storm door.  No rashes or skin changes. Neuro: No headache, numbness or weakness, balance or coordination issues. Endocrine: Diabetes.  No thyroid issues, hot flashes or night sweats. Psych: No mood changes, depression or anxiety. Pain: No focal pain. Review of systems: All other systems reviewed and found to be negative.  Physical Exam: Blood pressure 108/66, pulse 75, temperature 98.3 F (36.8 C), temperature source Tympanic, resp.  rate 18, weight 136 lb 9.2 oz (62 kg).  GENERAL: Well developed, well nourished, woman sitting comfortably in the exam room in no acute distress MENTAL STATUS: Alert and oriented to person, place and time. HEAD: Short styled dark blonde hair. Normocephalic, atraumatic, face symmetric, no Cushingoid features. EYES: Glasses. Blue eyes. Pupils equal round and reactive to light and accomodation. No conjunctivitis or scleral icterus. ENT: Oropharynx clear without lesion. Tongue normal. Mucous membranes moist.  RESPIRATORY: Clear to auscultation without rales, wheezes or rhonchi. CARDIOVASCULAR: Regular rate and rhythm without murmur, rub or gallop. ABDOMEN: Long ventral hernia.  Soft, non-tender, with active bowel sounds, and no hepatosplenomegaly. No masses. SKIN: Ecchymosis on arm.  No rashes,  ulcers or lesions. EXTREMITIES: No edema, no skin discoloration or tenderness. No palpable cords. LYMPH NODES: No palpable cervical, supraclavicular, axillary or inguinal adenopathy  NEUROLOGICAL: Unremarkable. PSYCH: Appropriate.    Appointment on 01/13/2016  Component Date Value Ref Range Status  . WBC 01/13/2016 12.7* 3.6 - 11.0 K/uL Final  . RBC 01/13/2016 4.87  3.80 - 5.20 MIL/uL Final  . Hemoglobin 01/13/2016 11.0* 12.0 - 16.0 g/dL Final  . HCT 01/13/2016 35.5  35.0 - 47.0 % Final  . MCV 01/13/2016 72.8* 80.0 - 100.0 fL Final  . MCH 01/13/2016 22.6* 26.0 - 34.0 pg Final  . MCHC 01/13/2016 31.1* 32.0 - 36.0 g/dL Final  . RDW 01/13/2016 19.5* 11.5 - 14.5 % Final  . Platelets 01/13/2016 385  150 - 440 K/uL Final  . Neutrophils Relative % 01/13/2016 78  % Final  . Neutro Abs 01/13/2016 10.0* 1.4 - 6.5 K/uL Final  . Lymphocytes Relative 01/13/2016 10  % Final  . Lymphs Abs 01/13/2016 1.2  1.0 - 3.6 K/uL Final  . Monocytes Relative 01/13/2016 6  % Final  . Monocytes Absolute 01/13/2016 0.7  0.2 - 0.9 K/uL Final  . Eosinophils Relative 01/13/2016 3  % Final  . Eosinophils Absolute  01/13/2016 0.4  0 - 0.7 K/uL Final  . Basophils Relative 01/13/2016 3  % Final  . Basophils Absolute 01/13/2016 0.3* 0 - 0.1 K/uL Final  . Sodium 01/13/2016 136  135 - 145 mmol/L Final  . Potassium 01/13/2016 3.7  3.5 - 5.1 mmol/L Final  . Chloride 01/13/2016 101  101 - 111 mmol/L Final  . CO2 01/13/2016 28  22 - 32 mmol/L Final  . Glucose, Bld 01/13/2016 196* 65 - 99 mg/dL Final  . BUN 01/13/2016 27* 6 - 20 mg/dL Final  . Creatinine, Ser 01/13/2016 0.98  0.44 - 1.00 mg/dL Final  . Calcium 01/13/2016 9.4  8.9 - 10.3 mg/dL Final  . Total Protein 01/13/2016 6.8  6.5 - 8.1 g/dL Final  . Albumin 01/13/2016 4.2  3.5 - 5.0 g/dL Final  . AST 01/13/2016 17  15 - 41 U/L Final  . ALT 01/13/2016 13* 14 - 54 U/L Final  . Alkaline Phosphatase 01/13/2016 42  38 - 126 U/L Final  . Total Bilirubin 01/13/2016 0.5  0.3 - 1.2 mg/dL Final  . GFR calc non Af Amer 01/13/2016 53* >60 mL/min Final  . GFR calc Af Amer 01/13/2016 >60  >60 mL/min Final   Comment: (NOTE) The eGFR has been calculated using the CKD EPI equation. This calculation has not been validated in all clinical situations. eGFR's persistently <60 mL/min signify possible Chronic Kidney Disease.   Georgiann Hahn gap 01/13/2016 7  5 - 15 Final    Assessment:  PHILLIPA MORDEN is a 82 y.o. female with JAK2+ polycythemia rubra vera (PV) diagnosed in 2008. She has undergone low phlebotomies x 2-3 to maintain a target hematocrit is 40-42.5.   She is on hydroxyurea 1 pill Monday, Wednesday, and Friday alternating with the following week on Monday, Wednesday, Friday, and Saturday. Screening for von Willebrand and a platelet function was normal. She is on a baby aspirin. She has been on Plavix since her TIA/CVA.   She has a history of iron deficiency anemia status post GI evaluation. Colonoscopy in 07/2007 was negative. For persistent recurrent iron deficiency anemia, she underwent EGD and colonoscopy in 12/2011 and 09/11/2013.  She has a history of  carcinoid tumor after presenting with a partial small bowel obstruction.  Abdominal and pelvic CT scan on 03/22/2015 revealed a 3 x 4.9 cm apple core lesion involving a loop of small bowel in the right mid abdomen. There was associated 4 x 1.8 cm adjacent mesenteric implant. Ileocolectomy on 03/28/2015 revealed a grade I neuroendocrine tumor (carcinoid) involving the terminal ileum and right colon.  Largest tumor was 2.5 cm. There were multiple adjacent nodules of a neuroendocrine tumor. Metastasis was in 9 of 12 lymph nodes.  Pathologic stage was T3N1.  Chromogranin was 4 on 05/07/2015.   24 hour urine for 5HIAA was 5.2 mg/24 hours (0-14.9).  Octreotide scan on 05/31/2015 revealed no evidence of metastatic disease.  There was an indeterminate 9 mm nodule in the left apex.    Chest CT on 06/11/2015 revealed an 8 mm nodular density (spiculated on axial images) in the left apex. Chest CT on 10/10/2015 revealed a stable 8 mm pulmonary nodule in the anterior left lung apex.  Follow-up was recommended in 6-8 months.  Symptomatically, she is doing well. Exam is unremarkable.  Hematocrit is good (35.5).  RBCs are microcytic.  Platelet count is good (385,000).  She denies any bleeding.  Plan: 1.  Labs today:  CBC with diff, CMP. 2.  Continue hydroxyurea 500 mg po q Monday, Wednesday, Friday, and Saturdays. 3.  Chest CT without contrast 05/2016 re: left apex lung nodule. 4.  RTC in 3 months for MD assess and labs (CBC with diff, CMP, chromagranin).   Lequita Asal, MD  01/13/2016, 11:32 AM

## 2016-01-13 NOTE — Progress Notes (Signed)
Patient is here for a follow up she has no complaints. Would like to talk about her aspirin and plavix

## 2016-01-14 ENCOUNTER — Other Ambulatory Visit: Payer: Self-pay | Admitting: *Deleted

## 2016-01-14 DIAGNOSIS — C7A00 Malignant carcinoid tumor of unspecified site: Secondary | ICD-10-CM

## 2016-01-14 DIAGNOSIS — D45 Polycythemia vera: Secondary | ICD-10-CM

## 2016-03-22 ENCOUNTER — Encounter: Payer: Self-pay | Admitting: Hematology and Oncology

## 2016-04-12 NOTE — Progress Notes (Signed)
[facility] day:  04/13/16  Chief Complaint: [name] is a 82 y.o. 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  HPI:  The patient was last seen in the medical oncology clinic on 01/13/2016.  At that time, she felt good.  Exam was unremarkable.  Hematocrit was 35.5.  RBCs were microcytic.  Platelet count was 385,000.  She continued hydroxyurea 500 mg po q Monday, Wednesday, Friday, and Saturdays.  During the interim, she feels "ok".  She notes needing to rest more.  She denies pain.  She can't play golf because of endurance issues.  She has had constipation.  She has needed to take laxatives for 2 months.  She is concerned about her carcinoid.  She continues with her same dose of hydroxyurea.    Past Medical History:  Diagnosis Date  . Adenocarcinoma in situ of cervix   . Arthritis    hands  . Breast cancer ([place])   . Colon cancer ([place])   . Diabetes mellitus without complication ([place])   . Endometrial carcinoma ([facility])    s/p total abdominal hysterectomy  . H/O compression fracture of spine 2014   thoracic spine  . H/O polycythemia vera   . H/O TIA (transient ischemic attack) and stroke 09/2014, 03/2015   No deficits  . Hypertension   . Hyperuricemia   . Microalbuminuria   . Polycythemia vera ([place])   . Recurrent falls   . Skin cancer    face, legs  . Stroke [facility]) 2008   no deficits  . Trochanteric bursitis   . Varicose veins    treated    Past Surgical History:  Procedure Laterality Date  . ABDOMINAL HYSTERECTOMY    . BOWEL RESECTION N/A 03/28/2015   Procedure: SMALL BOWEL RESECTION;  Surgeon: Leonie Green, MD;  Location: [facility] ORS;  Service: General;  Laterality: N/A;  . CATARACT EXTRACTION W/ INTRAOCULAR LENS IMPLANT Right   . CATARACT EXTRACTION W/PHACO Left 10/07/2015   Procedure: CATARACT EXTRACTION PHACO AND  INTRAOCULAR LENS PLACEMENT (IOC);  Surgeon: Ronnell Freshwater, MD;  Location: [place];  Service: Ophthalmology;  Laterality: Left;  DIABETIC - oral meds VISION BLUE  . EXPLORATORY LAPAROTOMY     for fibroids  . TONSILLECTOMY      Family History  Problem Relation Age of Onset  . Heart attack Mother   . Breast cancer Mother   . Heart attack Father   . Heart attack Sister   . Diabetes Sister   . Cancer Brother     AML  Her brother died of AML.  Social History:  reports that she has quit smoking. She has never used smokeless tobacco. She reports that she does not drink alcohol or use drugs.  The patient's husband died of AML.  She has no family in [place]. She was a principal (grades K through 5). She can no longer play golf secondary to endurance issues. The patient is alone today.  Allergies:  Allergies  Allergen Reactions  . Simvastatin     Other reaction(s): Muscle Pain    Current Medications: Current Outpatient Prescriptions  Medication Sig Dispense Refill  . aspirin EC 81 MG tablet Take 81 mg by mouth daily.    . calcium-vitamin D (OSCAL WITH D) 500-200 MG-UNIT per tablet Take 1 tablet by mouth daily.     . carvedilol (COREG)  6.25 MG tablet Take 6.25 mg by mouth 2 (two) times daily with a meal.    . chlorthalidone (HYGROTON) 25 MG tablet Take 25 mg by mouth daily.    . citalopram (CELEXA) 10 MG tablet Take 10 mg by mouth daily.    . clopidogrel (PLAVIX) 75 MG tablet Take 1 tablet (75 mg total) by mouth daily. 30 tablet 0  . fexofenadine (ALLEGRA) 180 MG tablet Take 180 mg by mouth daily as needed. Reported on 10/07/2015    . hydroxyurea (HYDREA) 500 MG capsule TAKE 1 CAPSULE BY MOUTH EVERY DAY 30 capsule 1  . loperamide (IMODIUM) 2 MG capsule Take 4 mg by mouth as needed for diarrhea or loose stools.    . metFORMIN (GLUCOPHAGE) 1000 MG tablet Take 1,000 mg by mouth 2 (two) times daily.     . polyethylene glycol (MIRALAX / GLYCOLAX) packet Take 17 g by  mouth daily as needed.    . potassium chloride (K-DUR,KLOR-CON) 10 MEQ tablet Take 10 mEq by mouth daily.    . pravastatin (PRAVACHOL) 20 MG tablet Take 20 mg by mouth at bedtime.     . Probiotic Product (PROBIOTIC DAILY PO) Take 1 tablet by mouth daily.    . ramipril (ALTACE) 10 MG capsule Take 10 mg by mouth 2 (two) times daily.     . verapamil (CALAN-SR) 240 MG CR tablet Take 240 mg by mouth daily.     No current facility-administered medications for this visit.     Review of Systems:  GENERAL: Feels "ok".  No endurance. Rests more.  No fevers or sweats.  Weight up 4 pounds. PERFORMANCE STATUS (ECOG): 1 HEENT: No visual changes, runny nose, sore throat, mouth sores or tenderness. Lungs: No shortness of breath or cough. No hemoptysis. Cardiac: No chest pain, palpitations, orthopnea, or PND. GI: Constipation, taking Miralax, x 2 months. No nausea, vomiting, diarrhea, melena or hematochezia. GU: No urgency, frequency, dysuria, or hematuria. Musculoskeletal: No back pain. No joint pain. No muscle tenderness. Extremities: No pain or swelling. Skin: Bruise on arm after struck by storm door.  No rashes or skin changes. Neuro: No headache, numbness or weakness, balance or coordination issues. Endocrine: Diabetes.  No thyroid issues, hot flashes or night sweats. Psych: No mood changes, depression or anxiety. Pain: No focal pain. Review of systems: All other systems reviewed and found to be negative.  Physical Exam: Blood pressure (!) 162/95, pulse 79, temperature (!) 96.8 F (36 C), temperature source Tympanic, resp. rate 18, weight 140 lb 10.5 oz (63.8 kg).  GENERAL: Well developed, well nourished, woman sitting comfortably in the exam room in no acute distress MENTAL STATUS: Alert and oriented to person, place and time. HEAD: Short styled dark blonde hair. Normocephalic, atraumatic, face symmetric, no Cushingoid features. EYES: Glasses. Blue eyes s/p cataract  surgery. Pupils equal round and reactive to light and accomodation. No conjunctivitis or scleral icterus. ENT: Oropharynx clear without lesion. Tongue normal. Mucous membranes moist.  RESPIRATORY: Clear to auscultation without rales, wheezes or rhonchi. CARDIOVASCULAR: Regular rate and rhythm without murmur, rub or gallop. ABDOMEN: Long ventral hernia.  Soft, non-tender, with active bowel sounds, and no hepatosplenomegaly. No masses. SKIN: Ecchymosis on arms.  No rashes, ulcers or lesions. EXTREMITIES: No edema, no skin discoloration or tenderness. No palpable cords. LYMPH NODES: No palpable cervical, supraclavicular, axillary or inguinal adenopathy  NEUROLOGICAL: Unremarkable. PSYCH: Appropriate.    Appointment on 04/13/2016  Component Date Value Ref Range Status  . WBC 04/13/2016 15.5* 3.6 -  11.0 K/uL Final  . RBC 04/13/2016 4.97  3.80 - 5.20 MIL/uL Final  . Hemoglobin 04/13/2016 11.2* 12.0 - 16.0 g/dL Final  . HCT 04/13/2016 35.8  35.0 - 47.0 % Final  . MCV 04/13/2016 72.1* 80.0 - 100.0 fL Final  . MCH 04/13/2016 22.6* 26.0 - 34.0 pg Final  . MCHC 04/13/2016 31.3* 32.0 - 36.0 g/dL Final  . RDW 04/13/2016 17.3* 11.5 - 14.5 % Final  . Platelets 04/13/2016 440  150 - 440 K/uL Final  . Neutrophils Relative % 04/13/2016 84  % Final  . Neutro Abs 04/13/2016 12.9* 1.4 - 6.5 K/uL Final  . Lymphocytes Relative 04/13/2016 9  % Final  . Lymphs Abs 04/13/2016 1.4  1.0 - 3.6 K/uL Final  . Monocytes Relative 04/13/2016 4  % Final  . Monocytes Absolute 04/13/2016 0.7  0.2 - 0.9 K/uL Final  . Eosinophils Relative 04/13/2016 3  % Final  . Eosinophils Absolute 04/13/2016 0.5  0 - 0.7 K/uL Final  . Basophils Relative 04/13/2016 0  % Final  . Basophils Absolute 04/13/2016 0.0  0 - 0.1 K/uL Final  . Sodium 04/13/2016 135  135 - 145 mmol/L Final  . Potassium 04/13/2016 4.1  3.5 - 5.1 mmol/L Final  . Chloride 04/13/2016 101  101 - 111 mmol/L Final  . CO2 04/13/2016 25  22 - 32 mmol/L Final   . Glucose, Bld 04/13/2016 257* 65 - 99 mg/dL Final  . BUN 04/13/2016 24* 6 - 20 mg/dL Final  . Creatinine, Ser 04/13/2016 1.00  0.44 - 1.00 mg/dL Final  . Calcium 04/13/2016 9.1  8.9 - 10.3 mg/dL Final  . Total Protein 04/13/2016 6.6  6.5 - 8.1 g/dL Final  . Albumin 04/13/2016 4.1  3.5 - 5.0 g/dL Final  . AST 04/13/2016 20  15 - 41 U/L Final  . ALT 04/13/2016 12* 14 - 54 U/L Final  . Alkaline Phosphatase 04/13/2016 44  38 - 126 U/L Final  . Total Bilirubin 04/13/2016 0.6  0.3 - 1.2 mg/dL Final  . GFR calc non Af Amer 04/13/2016 51* >60 mL/min Final  . GFR calc Af Amer 04/13/2016 59* >60 mL/min Final   Comment: (NOTE) The eGFR has been calculated using the CKD EPI equation. This calculation has not been validated in all clinical situations. eGFR's persistently <60 mL/min signify possible Chronic Kidney Disease.   . Anion gap 04/13/2016 9  5 - 15 Final    Assessment:  TAISIA FANTINI is a 82 y.o. female with JAK2+ polycythemia rubra vera (PV) diagnosed in 2008. She has undergone low phlebotomies x 2-3 to maintain a target hematocrit is 40-42.5.   She is on hydroxyurea 500 mg (1 pill) on Mondays, Wednesdays, Fridays, and Saturdays. Screening for von Willebrand and a platelet function was normal. She is on a baby aspirin. She has been on Plavix since her TIA/CVA.   She has a history of iron deficiency anemia status post GI evaluation. Colonoscopy in 07/2007 was negative. For persistent recurrent iron deficiency anemia, she underwent EGD and colonoscopy in 12/2011 and 09/11/2013.  She has a history of carcinoid tumor after presenting with a partial small bowel obstruction.  Abdominal and pelvic CT scan on 03/22/2015 revealed a 3 x 4.9 cm apple core lesion involving a loop of small bowel in the right mid abdomen. There was associated 4 x 1.8 cm adjacent mesenteric implant. Ileocolectomy on 03/28/2015 revealed a grade I neuroendocrine tumor (carcinoid) involving the terminal ileum and right  colon.  Largest  tumor was 2.5 cm. There were multiple adjacent nodules of a neuroendocrine tumor. Metastasis was in 9 of 12 lymph nodes.  Pathologic stage was T3N1.  Chromogranin was 4 on 05/07/2015 and 3 on 04/13/2016.   24 hour urine for 5HIAA was 5.2 mg/24 hours (0-14.9).  Octreotide scan on 05/31/2015 revealed no evidence of metastatic disease.  There was an indeterminate 9 mm nodule in the left apex.    Chest CT on 06/11/2015 revealed an 8 mm nodular density (spiculated on axial images) in the left apex. Chest CT on 10/10/2015 revealed a stable 8 mm pulmonary nodule in the anterior left lung apex.  Follow-up was recommended in 6-8 months.  Symptomatically, she notes a lack of endurance. She has had constipation x 2 months.  Exam is unremarkable.  Platelet count is 440,000.   Plan: 1.  Labs today:  CBC with diff, CMP, chromagranin. 2.  Continue current dose hydroxyurea.  Discuss platelet count slightly above goal.  Discuss rechecking platelet count and if remains above gaol, increasing hydroxyurea (1 pill at a time). 3.  Discuss current abdominal symptomatology. Symptoms may be related to adhesions (doubt carcinoid). Discuss a follow-up abdomen and pelvis CT scan.  Move up scheduled chest CT. 4.  Schedule chest, abdomen and pelvic CT scan.  5.  RTC after scans.   Lequita Asal, MD  04/13/2016, 12:25 PM

## 2016-04-13 ENCOUNTER — Inpatient Hospital Stay: Payer: Medicare Other | Attending: Hematology and Oncology

## 2016-04-13 ENCOUNTER — Inpatient Hospital Stay (HOSPITAL_BASED_OUTPATIENT_CLINIC_OR_DEPARTMENT_OTHER): Payer: Medicare Other | Admitting: Hematology and Oncology

## 2016-04-13 VITALS — BP 158/88 | HR 79 | Temp 96.8°F | Resp 18 | Wt 140.7 lb

## 2016-04-13 DIAGNOSIS — Z803 Family history of malignant neoplasm of breast: Secondary | ICD-10-CM | POA: Insufficient documentation

## 2016-04-13 DIAGNOSIS — Z85828 Personal history of other malignant neoplasm of skin: Secondary | ICD-10-CM | POA: Diagnosis not present

## 2016-04-13 DIAGNOSIS — Z8542 Personal history of malignant neoplasm of other parts of uterus: Secondary | ICD-10-CM

## 2016-04-13 DIAGNOSIS — Z8781 Personal history of (healed) traumatic fracture: Secondary | ICD-10-CM | POA: Insufficient documentation

## 2016-04-13 DIAGNOSIS — R5383 Other fatigue: Secondary | ICD-10-CM

## 2016-04-13 DIAGNOSIS — Z90710 Acquired absence of both cervix and uterus: Secondary | ICD-10-CM | POA: Diagnosis not present

## 2016-04-13 DIAGNOSIS — Z90722 Acquired absence of ovaries, bilateral: Secondary | ICD-10-CM

## 2016-04-13 DIAGNOSIS — K5900 Constipation, unspecified: Secondary | ICD-10-CM | POA: Diagnosis not present

## 2016-04-13 DIAGNOSIS — Z79899 Other long term (current) drug therapy: Secondary | ICD-10-CM | POA: Diagnosis not present

## 2016-04-13 DIAGNOSIS — Z85060 Personal history of malignant carcinoid tumor of small intestine: Secondary | ICD-10-CM | POA: Diagnosis not present

## 2016-04-13 DIAGNOSIS — C7A012 Malignant carcinoid tumor of the ileum: Secondary | ICD-10-CM | POA: Insufficient documentation

## 2016-04-13 DIAGNOSIS — R197 Diarrhea, unspecified: Secondary | ICD-10-CM

## 2016-04-13 DIAGNOSIS — Z7984 Long term (current) use of oral hypoglycemic drugs: Secondary | ICD-10-CM | POA: Diagnosis not present

## 2016-04-13 DIAGNOSIS — D45 Polycythemia vera: Secondary | ICD-10-CM | POA: Diagnosis not present

## 2016-04-13 DIAGNOSIS — E790 Hyperuricemia without signs of inflammatory arthritis and tophaceous disease: Secondary | ICD-10-CM

## 2016-04-13 DIAGNOSIS — M129 Arthropathy, unspecified: Secondary | ICD-10-CM

## 2016-04-13 DIAGNOSIS — E119 Type 2 diabetes mellitus without complications: Secondary | ICD-10-CM | POA: Insufficient documentation

## 2016-04-13 DIAGNOSIS — Z87891 Personal history of nicotine dependence: Secondary | ICD-10-CM | POA: Diagnosis not present

## 2016-04-13 DIAGNOSIS — C7A00 Malignant carcinoid tumor of unspecified site: Secondary | ICD-10-CM

## 2016-04-13 DIAGNOSIS — Z806 Family history of leukemia: Secondary | ICD-10-CM | POA: Diagnosis not present

## 2016-04-13 DIAGNOSIS — I10 Essential (primary) hypertension: Secondary | ICD-10-CM | POA: Diagnosis not present

## 2016-04-13 DIAGNOSIS — Z8673 Personal history of transient ischemic attack (TIA), and cerebral infarction without residual deficits: Secondary | ICD-10-CM | POA: Diagnosis not present

## 2016-04-13 DIAGNOSIS — Z853 Personal history of malignant neoplasm of breast: Secondary | ICD-10-CM

## 2016-04-13 DIAGNOSIS — D509 Iron deficiency anemia, unspecified: Secondary | ICD-10-CM

## 2016-04-13 DIAGNOSIS — R809 Proteinuria, unspecified: Secondary | ICD-10-CM

## 2016-04-13 DIAGNOSIS — R911 Solitary pulmonary nodule: Secondary | ICD-10-CM

## 2016-04-13 DIAGNOSIS — Z7982 Long term (current) use of aspirin: Secondary | ICD-10-CM | POA: Diagnosis not present

## 2016-04-13 DIAGNOSIS — K862 Cyst of pancreas: Secondary | ICD-10-CM

## 2016-04-13 DIAGNOSIS — Z9181 History of falling: Secondary | ICD-10-CM

## 2016-04-13 LAB — COMPREHENSIVE METABOLIC PANEL
ALT: 12 U/L — ABNORMAL LOW (ref 14–54)
AST: 20 U/L (ref 15–41)
Albumin: 4.1 g/dL (ref 3.5–5.0)
Alkaline Phosphatase: 44 U/L (ref 38–126)
Anion gap: 9 (ref 5–15)
BUN: 24 mg/dL — ABNORMAL HIGH (ref 6–20)
CO2: 25 mmol/L (ref 22–32)
Calcium: 9.1 mg/dL (ref 8.9–10.3)
Chloride: 101 mmol/L (ref 101–111)
Creatinine, Ser: 1 mg/dL (ref 0.44–1.00)
GFR calc Af Amer: 59 mL/min — ABNORMAL LOW (ref >60)
GFR calc non Af Amer: 51 mL/min — ABNORMAL LOW (ref >60)
Glucose, Bld: 257 mg/dL — ABNORMAL HIGH (ref 65–99)
Potassium: 4.1 mmol/L (ref 3.5–5.1)
Sodium: 135 mmol/L (ref 135–145)
Total Bilirubin: 0.6 mg/dL (ref 0.3–1.2)
Total Protein: 6.6 g/dL (ref 6.5–8.1)

## 2016-04-13 LAB — CBC WITH DIFFERENTIAL/PLATELET
Basophils Absolute: 0 10*3/uL (ref 0–0.1)
Basophils Relative: 0 %
Eosinophils Absolute: 0.5 10*3/uL (ref 0–0.7)
Eosinophils Relative: 3 %
HCT: 35.8 % (ref 35.0–47.0)
Hemoglobin: 11.2 g/dL — ABNORMAL LOW (ref 12.0–16.0)
Lymphocytes Relative: 9 %
Lymphs Abs: 1.4 10*3/uL (ref 1.0–3.6)
MCH: 22.6 pg — ABNORMAL LOW (ref 26.0–34.0)
MCHC: 31.3 g/dL — ABNORMAL LOW (ref 32.0–36.0)
MCV: 72.1 fL — ABNORMAL LOW (ref 80.0–100.0)
Monocytes Absolute: 0.7 10*3/uL (ref 0.2–0.9)
Monocytes Relative: 4 %
Neutro Abs: 12.9 10*3/uL — ABNORMAL HIGH (ref 1.4–6.5)
Neutrophils Relative %: 84 %
Platelets: 440 10*3/uL (ref 150–440)
RBC: 4.97 MIL/uL (ref 3.80–5.20)
RDW: 17.3 % — ABNORMAL HIGH (ref 11.5–14.5)
WBC: 15.5 10*3/uL — ABNORMAL HIGH (ref 3.6–11.0)

## 2016-04-13 NOTE — Progress Notes (Signed)
Patient states she does not have much stamina.  Has to rest between activities. Otherwise, no complaints.  Patients BP elevated 162/95 HR 79.  Patient states she forgot to take BP meds this morning.

## 2016-04-15 ENCOUNTER — Telehealth: Payer: Self-pay | Admitting: *Deleted

## 2016-04-15 LAB — CHROMOGRANIN A: Chromogranin A: 3 nmol/L (ref 0–5)

## 2016-04-15 NOTE — Telephone Encounter (Signed)
-----   Message from Lequita Asal, MD sent at 04/15/2016  3:28 PM EDT ----- Regarding: Please call patient   Chromagranin (tumor marker for carcinoid) is normal.  M  ----- Message ----- From: Interface, Lab In [place] Sent: 04/13/2016  11:19 AM To: Lequita Asal, MD

## 2016-04-15 NOTE — Telephone Encounter (Signed)
Called patient to inform her that her tumor marker was normal.  Patient verbalized understanding.

## 2016-04-20 ENCOUNTER — Ambulatory Visit
Admission: RE | Admit: 2016-04-20 | Discharge: 2016-04-20 | Disposition: A | Discharge status: Home / Self Care | Payer: Medicare Other | Source: Ambulatory Visit | Attending: Hematology and Oncology | Admitting: Hematology and Oncology

## 2016-04-20 DIAGNOSIS — I700 Atherosclerosis of aorta: Secondary | ICD-10-CM | POA: Insufficient documentation

## 2016-04-20 DIAGNOSIS — I517 Cardiomegaly: Secondary | ICD-10-CM | POA: Diagnosis not present

## 2016-04-20 DIAGNOSIS — I2510 Atherosclerotic heart disease of native coronary artery without angina pectoris: Secondary | ICD-10-CM | POA: Diagnosis not present

## 2016-04-20 DIAGNOSIS — C7A00 Malignant carcinoid tumor of unspecified site: Secondary | ICD-10-CM | POA: Diagnosis present

## 2016-04-20 DIAGNOSIS — R911 Solitary pulmonary nodule: Secondary | ICD-10-CM | POA: Diagnosis not present

## 2016-04-20 MED ORDER — IOPAMIDOL (ISOVUE-300) INJECTION 61%
100.0000 mL | Freq: Once | INTRAVENOUS | Status: AC | PRN
  Administered 2016-04-20: 100 mL via INTRAVENOUS

## 2016-04-23 ENCOUNTER — Other Ambulatory Visit: Payer: Self-pay | Admitting: *Deleted

## 2016-04-23 ENCOUNTER — Telehealth: Payer: Self-pay | Admitting: *Deleted

## 2016-04-23 ENCOUNTER — Inpatient Hospital Stay: Payer: Medicare Other | Attending: Hematology and Oncology | Admitting: Hematology and Oncology

## 2016-04-23 ENCOUNTER — Inpatient Hospital Stay: Payer: Medicare Other

## 2016-04-23 VITALS — BP 118/71 | HR 70 | Temp 97.6°F | Resp 18 | Wt 142.6 lb

## 2016-04-23 DIAGNOSIS — R5383 Other fatigue: Secondary | ICD-10-CM | POA: Insufficient documentation

## 2016-04-23 DIAGNOSIS — R197 Diarrhea, unspecified: Secondary | ICD-10-CM | POA: Insufficient documentation

## 2016-04-23 DIAGNOSIS — D45 Polycythemia vera: Secondary | ICD-10-CM | POA: Diagnosis not present

## 2016-04-23 DIAGNOSIS — Z85060 Personal history of malignant carcinoid tumor of small intestine: Secondary | ICD-10-CM | POA: Diagnosis not present

## 2016-04-23 DIAGNOSIS — Z9181 History of falling: Secondary | ICD-10-CM | POA: Diagnosis not present

## 2016-04-23 DIAGNOSIS — R809 Proteinuria, unspecified: Secondary | ICD-10-CM | POA: Diagnosis not present

## 2016-04-23 DIAGNOSIS — Z90710 Acquired absence of both cervix and uterus: Secondary | ICD-10-CM | POA: Diagnosis not present

## 2016-04-23 DIAGNOSIS — M129 Arthropathy, unspecified: Secondary | ICD-10-CM | POA: Diagnosis not present

## 2016-04-23 DIAGNOSIS — R911 Solitary pulmonary nodule: Secondary | ICD-10-CM | POA: Diagnosis not present

## 2016-04-23 DIAGNOSIS — Z8673 Personal history of transient ischemic attack (TIA), and cerebral infarction without residual deficits: Secondary | ICD-10-CM | POA: Insufficient documentation

## 2016-04-23 DIAGNOSIS — D509 Iron deficiency anemia, unspecified: Secondary | ICD-10-CM | POA: Insufficient documentation

## 2016-04-23 DIAGNOSIS — I10 Essential (primary) hypertension: Secondary | ICD-10-CM | POA: Insufficient documentation

## 2016-04-23 DIAGNOSIS — E119 Type 2 diabetes mellitus without complications: Secondary | ICD-10-CM | POA: Diagnosis not present

## 2016-04-23 DIAGNOSIS — Z87891 Personal history of nicotine dependence: Secondary | ICD-10-CM | POA: Diagnosis not present

## 2016-04-23 DIAGNOSIS — K862 Cyst of pancreas: Secondary | ICD-10-CM

## 2016-04-23 DIAGNOSIS — Z8781 Personal history of (healed) traumatic fracture: Secondary | ICD-10-CM | POA: Diagnosis not present

## 2016-04-23 DIAGNOSIS — Z90722 Acquired absence of ovaries, bilateral: Secondary | ICD-10-CM | POA: Diagnosis not present

## 2016-04-23 DIAGNOSIS — K5900 Constipation, unspecified: Secondary | ICD-10-CM

## 2016-04-23 DIAGNOSIS — D3A029 Benign carcinoid tumor of the large intestine, unspecified portion: Secondary | ICD-10-CM

## 2016-04-23 DIAGNOSIS — E790 Hyperuricemia without signs of inflammatory arthritis and tophaceous disease: Secondary | ICD-10-CM | POA: Diagnosis not present

## 2016-04-23 DIAGNOSIS — Z853 Personal history of malignant neoplasm of breast: Secondary | ICD-10-CM | POA: Diagnosis not present

## 2016-04-23 DIAGNOSIS — Z85828 Personal history of other malignant neoplasm of skin: Secondary | ICD-10-CM | POA: Insufficient documentation

## 2016-04-23 DIAGNOSIS — Z7982 Long term (current) use of aspirin: Secondary | ICD-10-CM | POA: Diagnosis not present

## 2016-04-23 DIAGNOSIS — Z79899 Other long term (current) drug therapy: Secondary | ICD-10-CM

## 2016-04-23 DIAGNOSIS — Z7984 Long term (current) use of oral hypoglycemic drugs: Secondary | ICD-10-CM | POA: Diagnosis not present

## 2016-04-23 DIAGNOSIS — Z8542 Personal history of malignant neoplasm of other parts of uterus: Secondary | ICD-10-CM | POA: Diagnosis not present

## 2016-04-23 LAB — CBC WITH DIFFERENTIAL/PLATELET
Basophils Absolute: 0.4 10*3/uL — ABNORMAL HIGH (ref 0–0.1)
Basophils Relative: 3 %
Eosinophils Absolute: 0.4 10*3/uL (ref 0–0.7)
Eosinophils Relative: 3 %
HCT: 36.3 % (ref 35.0–47.0)
Hemoglobin: 11.2 g/dL — ABNORMAL LOW (ref 12.0–16.0)
Lymphocytes Relative: 8 %
Lymphs Abs: 1.2 10*3/uL (ref 1.0–3.6)
MCH: 22.3 pg — ABNORMAL LOW (ref 26.0–34.0)
MCHC: 30.9 g/dL — ABNORMAL LOW (ref 32.0–36.0)
MCV: 72.4 fL — ABNORMAL LOW (ref 80.0–100.0)
Monocytes Absolute: 0.8 10*3/uL (ref 0.2–0.9)
Monocytes Relative: 5 %
Neutro Abs: 11.4 10*3/uL — ABNORMAL HIGH (ref 1.4–6.5)
Neutrophils Relative %: 81 %
Platelets: 411 10*3/uL (ref 150–440)
RBC: 5.01 MIL/uL (ref 3.80–5.20)
RDW: 17.4 % — ABNORMAL HIGH (ref 11.5–14.5)
WBC: 14.1 10*3/uL — ABNORMAL HIGH (ref 3.6–11.0)

## 2016-04-23 NOTE — Telephone Encounter (Signed)
-----   Message from Lequita Asal, MD sent at 04/23/2016 11:59 AM EDT ----- Regarding: Please call patient  Let patient know. Platelet count better. No change in hydroxyurea.  M  ----- Message ----- From: Interface, Lab In [place] Sent: 04/23/2016  11:21 AM To: Lequita Asal, MD

## 2016-04-23 NOTE — Telephone Encounter (Signed)
Called pt house and got her voicemail and I left message that her plt count was better 411.  She will remain on the same dose of hydrea. She can call if she has questions.

## 2016-04-23 NOTE — Progress Notes (Signed)
[facility] day:  04/23/16  Chief Complaint: [name] is a 82 y.o. 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  HPI:  The patient was last seen in the medical oncology clinic on 04/13/2016.  At that time, she was struggling with constipation. Her endurance had decreased.  We discussed follow-up scans to reassess her carcinoid. She denied any flushing.    Labs included a hematocrit 35.8, hemoglobin 11.2, MCV 72.1, platelets 440,000, white count 15,500 with an [place] of 12,900. Comprehensive metabolic panel included a creatinine of 1.0 and a glucose of 257. Chromotogranin A was 3 (0-5).  Chest and abdomen CT scan on 04/20/2016 revealed no definite evidence of recurrent or metastatic carcinoid tumor. There are no acute findings. There was a stable 8 mm pulmonary nodule in the left apex recommendations for continued follow-up CT in 12 months. 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 Recommendation was continued attention on follow-up or follow-up with abdominal MRI with and without contrast in 2 years.  Symptomatically, she states "I think I am doing alright". She continues to struggle with constipation as well as some diarrhea. She takes some Imodium   Past Medical History:  Diagnosis Date  . Adenocarcinoma in situ of cervix   . Arthritis    hands  . Breast cancer ([place])   . Colon cancer ([place])   . Diabetes mellitus without complication ([place])   . Endometrial carcinoma ([facility])    s/p total abdominal hysterectomy  . H/O compression fracture of spine 2014   thoracic spine  . H/O polycythemia vera   . H/O TIA (transient ischemic attack) and stroke 09/2014, 03/2015   No deficits  . Hypertension   . Hyperuricemia   . Microalbuminuria   . Polycythemia vera  ([place])   . Recurrent falls   . Skin cancer    face, legs  . Stroke [facility]) 2008   no deficits  . Trochanteric bursitis   . Varicose veins    treated    Past Surgical History:  Procedure Laterality Date  . ABDOMINAL HYSTERECTOMY    . BOWEL RESECTION N/A 03/28/2015   Procedure: SMALL BOWEL RESECTION;  Surgeon: Leonie Green, MD;  Location: [facility] ORS;  Service: General;  Laterality: N/A;  . CATARACT EXTRACTION W/ INTRAOCULAR LENS IMPLANT Right   . CATARACT EXTRACTION W/PHACO Left 10/07/2015   Procedure: CATARACT EXTRACTION PHACO AND INTRAOCULAR LENS PLACEMENT (IOC);  Surgeon: Ronnell Freshwater, MD;  Location: [facility];  Service: Ophthalmology;  Laterality: Left;  DIABETIC - oral meds VISION BLUE  . EXPLORATORY LAPAROTOMY     for fibroids  . TONSILLECTOMY      Family History  Problem Relation Age of Onset  . Heart attack Mother   . Breast cancer Mother   . Heart attack Father   . Heart attack Sister   . Diabetes Sister   . Cancer Brother     AML  Her brother died of AML.  Social History:  reports that she has quit smoking. She has never used smokeless tobacco. She reports that she does not drink alcohol or use drugs.  The patient's husband died of AML.  She has no family in [place]. 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 The  patient is alone today.  Allergies:  Allergies  Allergen Reactions  . Simvastatin     Other reaction(s): Muscle Pain    Current Medications: Current Outpatient Prescriptions  Medication Sig Dispense Refill  . aspirin EC 81 MG tablet Take 81 mg by mouth daily.    . calcium-vitamin D (OSCAL WITH D) 500-200 MG-UNIT per tablet Take 1 tablet by mouth daily.     . carvedilol (COREG) 6.25 MG tablet Take 6.25 mg by mouth 2 (two) times daily with a meal.    . chlorthalidone (HYGROTON) 25 MG tablet Take 25 mg by mouth daily.    . citalopram (CELEXA) 10 MG tablet Take 10 mg by mouth  daily.    . clopidogrel (PLAVIX) 75 MG tablet Take 1 tablet (75 mg total) by mouth daily. 30 tablet 0  . fexofenadine (ALLEGRA) 180 MG tablet Take 180 mg by mouth daily as needed. Reported on 10/07/2015    . hydroxyurea (HYDREA) 500 MG capsule TAKE 1 CAPSULE BY MOUTH EVERY DAY 30 capsule 1  . loperamide (IMODIUM) 2 MG capsule Take 4 mg by mouth as needed for diarrhea or loose stools.    . metFORMIN (GLUCOPHAGE) 1000 MG tablet Take 1,000 mg by mouth 2 (two) times daily.     . polyethylene glycol (MIRALAX / GLYCOLAX) packet Take 17 g by mouth daily as needed.    . potassium chloride (K-DUR,KLOR-CON) 10 MEQ tablet Take 10 mEq by mouth daily.    . pravastatin (PRAVACHOL) 20 MG tablet Take 20 mg by mouth at bedtime.     . Probiotic Product (PROBIOTIC DAILY PO) Take 1 tablet by mouth daily.    . ramipril (ALTACE) 10 MG capsule Take 10 mg by mouth 2 (two) times daily.     . verapamil (CALAN-SR) 240 MG CR tablet Take 240 mg by mouth daily.     No current facility-administered medications for this visit.     Review of Systems:  GENERAL: Feels "alright". No fevers or sweats.  Weight up 1 pounds. PERFORMANCE STATUS (ECOG): 1 HEENT: No visual changes, runny nose, sore throat, mouth sores or tenderness. Lungs: No shortness of breath or cough. No hemoptysis. Cardiac: No chest pain, palpitations, orthopnea, or PND. GI: Constipation and diarrhea.  No nausea, vomiting, melena or hematochezia. GU: No urgency, frequency, dysuria, or hematuria. Musculoskeletal: No back pain. No joint pain. No muscle tenderness. Extremities: No pain or swelling. Skin: No rashes or skin changes. Neuro: No headache, numbness or weakness, balance or coordination issues. Endocrine: Diabetes.  No thyroid issues, hot flashes or night sweats. Psych: No mood changes, depression or anxiety. Pain: No focal pain. Review of systems: All other systems reviewed and found to be negative.  Physical Exam: Blood pressure  118/71, pulse 70, temperature 97.6 F (36.4 C), temperature source Tympanic, resp. rate 18, weight 142 lb 10.2 oz (64.7 kg).  GENERAL: Well developed, well nourished, woman sitting comfortably in the exam room in no acute distress MENTAL STATUS: Alert and oriented to person, place and time. HEAD: Short styled dark blonde hair. Normocephalic, atraumatic, face symmetric, no Cushingoid features. EYES: Glasses. Blue eyes s/p left cataract surgery. No conjunctivitis or scleral icterus. NEUROLOGICAL: Unremarkable. PSYCH: Appropriate.    No visits with results within 3 Day(s) from this visit.  Latest known visit with results is:  Appointment on 04/13/2016  Component Date Value Ref Range Status  . WBC 04/13/2016 15.5* 3.6 - 11.0 K/uL Final  . RBC 04/13/2016 4.97  3.80 - 5.20 MIL/uL Final  .  Hemoglobin 04/13/2016 11.2* 12.0 - 16.0 g/dL Final  . HCT 04/13/2016 35.8  35.0 - 47.0 % Final  . MCV 04/13/2016 72.1* 80.0 - 100.0 fL Final  . MCH 04/13/2016 22.6* 26.0 - 34.0 pg Final  . MCHC 04/13/2016 31.3* 32.0 - 36.0 g/dL Final  . RDW 04/13/2016 17.3* 11.5 - 14.5 % Final  . Platelets 04/13/2016 440  150 - 440 K/uL Final  . Neutrophils Relative % 04/13/2016 84  % Final  . Neutro Abs 04/13/2016 12.9* 1.4 - 6.5 K/uL Final  . Lymphocytes Relative 04/13/2016 9  % Final  . Lymphs Abs 04/13/2016 1.4  1.0 - 3.6 K/uL Final  . Monocytes Relative 04/13/2016 4  % Final  . Monocytes Absolute 04/13/2016 0.7  0.2 - 0.9 K/uL Final  . Eosinophils Relative 04/13/2016 3  % Final  . Eosinophils Absolute 04/13/2016 0.5  0 - 0.7 K/uL Final  . Basophils Relative 04/13/2016 0  % Final  . Basophils Absolute 04/13/2016 0.0  0 - 0.1 K/uL Final  . Sodium 04/13/2016 135  135 - 145 mmol/L Final  . Potassium 04/13/2016 4.1  3.5 - 5.1 mmol/L Final  . Chloride 04/13/2016 101  101 - 111 mmol/L Final  . CO2 04/13/2016 25  22 - 32 mmol/L Final  . Glucose, Bld 04/13/2016 257* 65 - 99 mg/dL Final  . BUN 04/13/2016 24* 6 -  20 mg/dL Final  . Creatinine, Ser 04/13/2016 1.00  0.44 - 1.00 mg/dL Final  . Calcium 04/13/2016 9.1  8.9 - 10.3 mg/dL Final  . Total Protein 04/13/2016 6.6  6.5 - 8.1 g/dL Final  . Albumin 04/13/2016 4.1  3.5 - 5.0 g/dL Final  . AST 04/13/2016 20  15 - 41 U/L Final  . ALT 04/13/2016 12* 14 - 54 U/L Final  . Alkaline Phosphatase 04/13/2016 44  38 - 126 U/L Final  . Total Bilirubin 04/13/2016 0.6  0.3 - 1.2 mg/dL Final  . GFR calc non Af Amer 04/13/2016 51* >60 mL/min Final  . GFR calc Af Amer 04/13/2016 59* >60 mL/min Final   Comment: (NOTE) The eGFR has been calculated using the CKD EPI equation. This calculation has not been validated in all clinical situations. eGFR's persistently <60 mL/min signify possible Chronic Kidney Disease.   . Anion gap 04/13/2016 9  5 - 15 Final  . Chromogranin A 04/15/2016 3  0 - 5 nmol/L Final   Comment: (NOTE) Chromogranin A performed by [employer] methodology. Results for this test are designated to be for research purposes only by the assay's manufacturer. The performance characteristics of this product have not been established. Results for this test should not be used as absolute evidence of presence or absence of malignant disease without confirmation of the diagnosis by another medically established diagnostic product or procedure. Values obtained with different assay methods or kits cannot be used interchangeably. Performed At: [facility] [address] [place], [place] [id number] Lindon Romp MD WP:[id number]     Assessment:  [name] is a 82 y.o. female with JAK2+ polycythemia rubra vera (PV) diagnosed in 2008. She has undergone low phlebotomies x 2-3 to maintain a target hematocrit is 40-42.5.   She is on hydroxyurea 500 mg (1 pill) on Mondays, Wednesdays, Fridays and Saturdays. Screening for von Willebrand and a platelet function was normal. She is on a baby aspirin. She has been on Plavix since her TIA/CVA.    She has a history of iron deficiency anemia status post GI evaluation. Colonoscopy  in 07/2007 was negative. For persistent recurrent iron deficiency anemia, she underwent EGD and colonoscopy in 12/2011 and 09/11/2013.  She has a history of carcinoid tumor after presenting with a partial small bowel obstruction.  Abdominal and pelvic CT scan on 03/22/2015 revealed a 3 x 4.9 cm apple core lesion involving a loop of small bowel in the right mid abdomen. There was associated 4 x 1.8 cm adjacent mesenteric implant. Ileocolectomy on 03/28/2015 revealed a grade I neuroendocrine tumor (carcinoid) involving the terminal ileum and right colon.  Largest tumor was 2.5 cm. There were multiple adjacent nodules of a neuroendocrine tumor. Metastasis was in 9 of 12 lymph nodes.  Pathologic stage was T3N1.  Chromogranin was 4 on 05/07/2015 and 3 on 04/13/2016.   24 hour urine for 5HIAA was 5.2 mg/24 hours (0-14.9).  Octreotide scan on 05/31/2015 revealed no evidence of metastatic disease.  There was an indeterminate 9 mm nodule in the left apex.    Chest CT on 06/11/2015 revealed an 8 mm nodular density (spiculated on axial images) in the left apex. Chest CT on 10/10/2015 revealed a stable 8 mm pulmonary nodule in the anterior left lung apex.  Chest, abdomen and pelvic CT scan on 04/20/2016 revealed no definite evidence of recurrent or metastatic carcinoid tumor. There are no acute findings. There was a stable 8 mm pulmonary nodule in the left apex recommendations (follow-up CT in 12 months). 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 Recommendation was continued attention on follow-up or follow-up with abdominal MRI with and without contrast in 2 years.  Symptomatically, she has diarrhea alternating with constipation (? irritable bowel).  Platelet count is 411,000.   Plan: 1.  Labs today:  CBC with diff. 2.  Continue current dose of  hydroxyurea as platelet count drifting down toward goal (400,000). 3.  Review abdomen and pelvic CT scan.  No evidence of recurrent carcinoid. Discuss stable cystic lesion in the pancreatic tail.  Review plan for follow-up imaging with radiology. 4.  Review chest CT.  Left apical nodule is stable. Plan for follow-up chest CT in 12 months (04/20/2017). 5.  RTC in 3 months for MD assessment, labs (CBC with diff, CMP, chromagranin).   Lequita Asal, MD  04/23/2016, 10:41 AM

## 2016-04-23 NOTE — Telephone Encounter (Signed)
-----   Message from Lequita Asal, MD sent at 04/23/2016 11:59 AM EDT ----- Regarding: Please call patient  Let patient know. Platelet count better. No change in hydroxyurea.  M  ----- Message ----- From: Interface, Lab In [place] Sent: 04/23/2016  11:21 AM To: Lequita Asal, MD

## 2016-04-23 NOTE — Progress Notes (Signed)
Patient states she gets fatigued easily.  Does not have the stamina she had before her stomach surgery.

## 2016-04-23 NOTE — Telephone Encounter (Signed)
Called patient to let her know counts are improving.  She should continue on current dosage of hydroxyurea.  Verbalized understanding and grateful for call.

## 2016-04-25 ENCOUNTER — Encounter: Payer: Self-pay | Admitting: Hematology and Oncology

## 2016-04-29 ENCOUNTER — Other Ambulatory Visit: Payer: Self-pay | Admitting: *Deleted

## 2016-04-29 MED ORDER — HYDROXYUREA 500 MG PO CAPS: ORAL_CAPSULE | ORAL | 0 refills | Status: DC

## 2016-04-29 NOTE — Telephone Encounter (Signed)
I cannot tell what her dose should be, Her last notes all say continue current dose, but what is on er med list says 1 capsule daily, but has not been filled since 7/19 for # 30 with one refill. Please clarify correct med list and e scribe med.

## 2016-04-29 NOTE — Telephone Encounter (Signed)
  She is on:  She is on hydroxyurea 500 mg (1 pill) on Mondays, Wednesdays, Fridays and Saturdays.   Near the top of the assessment on notes.  Did she get her hydrea someplace else, because she would have run out?   M

## 2016-05-27 ENCOUNTER — Ambulatory Visit: Admission: RE | Admit: 2016-05-27 | Payer: Medicare Other | Source: Ambulatory Visit

## 2016-06-16 ENCOUNTER — Other Ambulatory Visit: Payer: Self-pay | Admitting: *Deleted

## 2016-06-16 MED ORDER — HYDROXYUREA 500 MG PO CAPS: ORAL_CAPSULE | ORAL | 0 refills | Status: DC

## 2016-07-28 ENCOUNTER — Encounter: Payer: Self-pay | Admitting: Hematology and Oncology

## 2016-07-28 ENCOUNTER — Inpatient Hospital Stay (HOSPITAL_BASED_OUTPATIENT_CLINIC_OR_DEPARTMENT_OTHER): Payer: Medicare Other | Admitting: Hematology and Oncology

## 2016-07-28 ENCOUNTER — Inpatient Hospital Stay: Payer: Medicare Other | Attending: Hematology and Oncology

## 2016-07-28 VITALS — BP 116/68 | HR 78 | Temp 96.2°F | Resp 18 | Wt 138.4 lb

## 2016-07-28 DIAGNOSIS — Z7982 Long term (current) use of aspirin: Secondary | ICD-10-CM

## 2016-07-28 DIAGNOSIS — D509 Iron deficiency anemia, unspecified: Secondary | ICD-10-CM

## 2016-07-28 DIAGNOSIS — Z85828 Personal history of other malignant neoplasm of skin: Secondary | ICD-10-CM

## 2016-07-28 DIAGNOSIS — Z90722 Acquired absence of ovaries, bilateral: Secondary | ICD-10-CM | POA: Diagnosis not present

## 2016-07-28 DIAGNOSIS — Z85038 Personal history of other malignant neoplasm of large intestine: Secondary | ICD-10-CM | POA: Insufficient documentation

## 2016-07-28 DIAGNOSIS — Z8781 Personal history of (healed) traumatic fracture: Secondary | ICD-10-CM | POA: Insufficient documentation

## 2016-07-28 DIAGNOSIS — Z90710 Acquired absence of both cervix and uterus: Secondary | ICD-10-CM | POA: Insufficient documentation

## 2016-07-28 DIAGNOSIS — M7060 Trochanteric bursitis, unspecified hip: Secondary | ICD-10-CM | POA: Diagnosis not present

## 2016-07-28 DIAGNOSIS — R5383 Other fatigue: Secondary | ICD-10-CM | POA: Diagnosis not present

## 2016-07-28 DIAGNOSIS — R809 Proteinuria, unspecified: Secondary | ICD-10-CM

## 2016-07-28 DIAGNOSIS — Z8673 Personal history of transient ischemic attack (TIA), and cerebral infarction without residual deficits: Secondary | ICD-10-CM | POA: Insufficient documentation

## 2016-07-28 DIAGNOSIS — R918 Other nonspecific abnormal finding of lung field: Secondary | ICD-10-CM | POA: Diagnosis not present

## 2016-07-28 DIAGNOSIS — E790 Hyperuricemia without signs of inflammatory arthritis and tophaceous disease: Secondary | ICD-10-CM | POA: Insufficient documentation

## 2016-07-28 DIAGNOSIS — Z7984 Long term (current) use of oral hypoglycemic drugs: Secondary | ICD-10-CM | POA: Diagnosis not present

## 2016-07-28 DIAGNOSIS — Z803 Family history of malignant neoplasm of breast: Secondary | ICD-10-CM | POA: Diagnosis not present

## 2016-07-28 DIAGNOSIS — Z8541 Personal history of malignant neoplasm of cervix uteri: Secondary | ICD-10-CM | POA: Diagnosis not present

## 2016-07-28 DIAGNOSIS — I10 Essential (primary) hypertension: Secondary | ICD-10-CM | POA: Diagnosis not present

## 2016-07-28 DIAGNOSIS — Z79899 Other long term (current) drug therapy: Secondary | ICD-10-CM | POA: Insufficient documentation

## 2016-07-28 DIAGNOSIS — D45 Polycythemia vera: Secondary | ICD-10-CM | POA: Insufficient documentation

## 2016-07-28 DIAGNOSIS — D3A029 Benign carcinoid tumor of the large intestine, unspecified portion: Secondary | ICD-10-CM

## 2016-07-28 DIAGNOSIS — E119 Type 2 diabetes mellitus without complications: Secondary | ICD-10-CM

## 2016-07-28 DIAGNOSIS — Z853 Personal history of malignant neoplasm of breast: Secondary | ICD-10-CM

## 2016-07-28 DIAGNOSIS — Z806 Family history of leukemia: Secondary | ICD-10-CM | POA: Insufficient documentation

## 2016-07-28 DIAGNOSIS — K5900 Constipation, unspecified: Secondary | ICD-10-CM | POA: Diagnosis not present

## 2016-07-28 DIAGNOSIS — R911 Solitary pulmonary nodule: Secondary | ICD-10-CM

## 2016-07-28 LAB — CBC WITH DIFFERENTIAL/PLATELET
Basophils Absolute: 0.4 10*3/uL — ABNORMAL HIGH (ref 0–0.1)
Basophils Relative: 3 %
Eosinophils Absolute: 0.5 10*3/uL (ref 0–0.7)
Eosinophils Relative: 3 %
HCT: 37.9 % (ref 35.0–47.0)
Hemoglobin: 12 g/dL (ref 12.0–16.0)
Lymphocytes Relative: 9 %
Lymphs Abs: 1.5 10*3/uL (ref 1.0–3.6)
MCH: 22.9 pg — ABNORMAL LOW (ref 26.0–34.0)
MCHC: 31.8 g/dL — ABNORMAL LOW (ref 32.0–36.0)
MCV: 72 fL — ABNORMAL LOW (ref 80.0–100.0)
Monocytes Absolute: 1 10*3/uL — ABNORMAL HIGH (ref 0.2–0.9)
Monocytes Relative: 6 %
Neutro Abs: 13.5 10*3/uL — ABNORMAL HIGH (ref 1.4–6.5)
Neutrophils Relative %: 79 %
Platelets: 757 10*3/uL — ABNORMAL HIGH (ref 150–440)
RBC: 5.26 MIL/uL — ABNORMAL HIGH (ref 3.80–5.20)
RDW: 18.2 % — ABNORMAL HIGH (ref 11.5–14.5)
WBC: 16.9 10*3/uL — ABNORMAL HIGH (ref 3.6–11.0)

## 2016-07-28 LAB — COMPREHENSIVE METABOLIC PANEL
ALT: 15 U/L (ref 14–54)
AST: 22 U/L (ref 15–41)
Albumin: 4.1 g/dL (ref 3.5–5.0)
Alkaline Phosphatase: 40 U/L (ref 38–126)
Anion gap: 11 (ref 5–15)
BUN: 25 mg/dL — ABNORMAL HIGH (ref 6–20)
CO2: 25 mmol/L (ref 22–32)
Calcium: 9.3 mg/dL (ref 8.9–10.3)
Chloride: 100 mmol/L — ABNORMAL LOW (ref 101–111)
Creatinine, Ser: 0.96 mg/dL (ref 0.44–1.00)
GFR calc Af Amer: >60 mL/min (ref >60)
GFR calc non Af Amer: 54 mL/min — ABNORMAL LOW (ref >60)
Glucose, Bld: 208 mg/dL — ABNORMAL HIGH (ref 65–99)
Potassium: 4.3 mmol/L (ref 3.5–5.1)
Sodium: 136 mmol/L (ref 135–145)
Total Bilirubin: 0.5 mg/dL (ref 0.3–1.2)
Total Protein: 6.9 g/dL (ref 6.5–8.1)

## 2016-07-28 NOTE — Progress Notes (Signed)
Patient offers no concerns today. 

## 2016-07-28 NOTE — Progress Notes (Signed)
[facility] day:  07/28/16  Chief Complaint: [name] is a 83 y.o. 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  HPI:  The patient was last seen in the medical oncology clinic on 04/23/2016.  At that time, she had diarrhea alternating with constipation (? irritable bowel).  Platelet count was 411,000.  She continued hydroxyurea (500 mg on Mondays, Wednesdays, Fridays, and Saturdays).  She was struggling with constipation. Her endurance had decreased.  We discussed follow-up scans to reassess her carcinoid. She denied any flushing.  Chest and abdomen CT scan on 04/20/2016 revealed no definite evidence of recurrent or metastatic carcinoid tumor.   During the interim, she had a "bad cold" and had an episode of nausea and vomiting followed by dizziness that lasted for about 15 minutes. She called EMT where she was evaluated at home and thought to be okay. She was not taken to the hospital. This concerned her because she has had three TIA in the past. She has no other neurological symptoms. She denies any more episodes of this type. She did not follow-up with anyone after the episode because it "went away".    Past Medical History:  Diagnosis Date  . Adenocarcinoma in situ of cervix   . Arthritis    hands  . Breast cancer ([place])   . Colon cancer ([place])   . Diabetes mellitus without complication ([place])   . Endometrial carcinoma (HCC)    s/p total abdominal hysterectomy  . H/O compression fracture of spine 2014   thoracic spine  . H/O polycythemia vera   . H/O TIA (transient ischemic attack) and stroke 09/2014, 03/2015   No deficits  . Hypertension   . Hyperuricemia   . Microalbuminuria   . Polycythemia vera ([place])   . Recurrent falls   . Skin cancer    face, legs  . Stroke [facility]) 2008   no deficits  . Trochanteric bursitis   . Varicose  veins    treated    Past Surgical History:  Procedure Laterality Date  . ABDOMINAL HYSTERECTOMY    . BOWEL RESECTION N/A 03/28/2015   Procedure: SMALL BOWEL RESECTION;  Surgeon: Leonie Green, MD;  Location: [facility] ORS;  Service: General;  Laterality: N/A;  . CATARACT EXTRACTION W/ INTRAOCULAR LENS IMPLANT Right   . CATARACT EXTRACTION W/PHACO Left 10/07/2015   Procedure: CATARACT EXTRACTION PHACO AND INTRAOCULAR LENS PLACEMENT (IOC);  Surgeon: Ronnell Freshwater, MD;  Location: [place];  Service: Ophthalmology;  Laterality: Left;  DIABETIC - oral meds VISION BLUE  . EXPLORATORY LAPAROTOMY     for fibroids  . TONSILLECTOMY      Family History  Problem Relation Age of Onset  . Heart attack Mother   . Breast cancer Mother   . Heart attack Father   . Heart attack Sister   . Diabetes Sister   . Cancer Brother     AML  Her brother died of AML.  Social History:  reports that she has quit smoking. She has never used smokeless tobacco. She reports that she does not drink alcohol or use drugs.  The patient's husband died of AML.  She has no family in [place]. 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 The patient is alone today.  Allergies:  Allergies  Allergen Reactions  .  Simvastatin     Other reaction(s): Muscle Pain    Current Medications: Current Outpatient Prescriptions  Medication Sig Dispense Refill  . aspirin EC 81 MG tablet Take 81 mg by mouth daily.    . calcium-vitamin D (OSCAL WITH D) 500-200 MG-UNIT per tablet Take 1 tablet by mouth daily.     . carvedilol (COREG) 6.25 MG tablet Take 6.25 mg by mouth 2 (two) times daily with a meal.    . chlorthalidone (HYGROTON) 25 MG tablet Take 25 mg by mouth daily.    . citalopram (CELEXA) 10 MG tablet Take 10 mg by mouth daily.    . clopidogrel (PLAVIX) 75 MG tablet Take 1 tablet (75 mg total) by mouth daily. 30 tablet 0  . fexofenadine (ALLEGRA) 180 MG  tablet Take 180 mg by mouth daily as needed. Reported on 10/07/2015    . hydroxyurea (HYDREA) 500 MG capsule 500 mg (1 pill) on Mondays, Wednesdays, Fridays and Saturdays.May take with food to minimize GI side effects. 45 capsule 0  . loperamide (IMODIUM) 2 MG capsule Take 4 mg by mouth as needed for diarrhea or loose stools.    . metFORMIN (GLUCOPHAGE) 1000 MG tablet Take 1,000 mg by mouth 2 (two) times daily.     . polyethylene glycol (MIRALAX / GLYCOLAX) packet Take 17 g by mouth daily as needed.    . potassium chloride (K-DUR,KLOR-CON) 10 MEQ tablet Take 10 mEq by mouth daily.    . pravastatin (PRAVACHOL) 20 MG tablet Take 20 mg by mouth at bedtime.     . Probiotic Product (PROBIOTIC DAILY PO) Take 1 tablet by mouth daily.    . ramipril (ALTACE) 10 MG capsule Take 10 mg by mouth 2 (two) times daily.     . verapamil (CALAN-SR) 240 MG CR tablet Take 240 mg by mouth daily.     No current facility-administered medications for this visit.     Review of Systems:  GENERAL: Feels "alright". Decreased stamina.  No fevers or sweats.  Weight down 4 pounds. PERFORMANCE STATUS (ECOG): 1 HEENT: Getting over a cold.  No visual changes, runny nose, sore throat, mouth sores or tenderness. Lungs: No shortness of breath or cough. No hemoptysis. Cardiac: No chest pain, palpitations, orthopnea, or PND. GI: Constipation and diarrhea.  No nausea, vomiting, melena or hematochezia. GU: No urgency, frequency, dysuria, or hematuria. Musculoskeletal: No back pain. No joint pain. No muscle tenderness. Extremities: No pain or swelling. Skin: No rashes or skin changes. Neuro: No headache, numbness or weakness, balance or coordination issues. Endocrine: Diabetes.  No thyroid issues, hot flashes or night sweats. Psych: No mood changes, depression or anxiety. Pain: No focal pain. Review of systems: All other systems reviewed and found to be negative.  Physical Exam: Blood pressure 116/68, pulse  78, temperature (!) 96.2 F (35.7 C), temperature source Tympanic, resp. rate 18, weight 138 lb 7.2 oz (62.8 kg).  GENERAL: Well developed, well nourished, woman sitting comfortably in the exam room in no acute distress MENTAL STATUS: Alert and oriented to person, place and time. HEAD: Short styled dark blonde hair. Normocephalic, atraumatic, face symmetric, no Cushingoid features. EYES: Glasses. Blue eyes s/p cataract surgery. Pupils equal round and reactive to light and accomodation. No conjunctivitis or scleral icterus. ENT: Oropharynx clear without lesion. Tongue normal. Mucous membranes moist.  RESPIRATORY: Clear to auscultation without rales, wheezes or rhonchi. CARDIOVASCULAR: Regular rate and rhythm without murmur, rub or gallop. ABDOMEN: Long ventral hernia.  Soft, non-tender, with active bowel sounds,  and no hepatosplenomegaly. No masses. SKIN: Ecchymosis on arm.  No rashes, ulcers or lesions. EXTREMITIES: No edema, no skin discoloration or tenderness. No palpable cords. LYMPH NODES: No palpable cervical, supraclavicular, axillary or inguinal adenopathy  NEUROLOGICAL: Unremarkable. PSYCH: Appropriate.    Appointment on 07/28/2016  Component Date Value Ref Range Status  . WBC 07/28/2016 16.9* 3.6 - 11.0 K/uL Final  . RBC 07/28/2016 5.26* 3.80 - 5.20 MIL/uL Final  . Hemoglobin 07/28/2016 12.0  12.0 - 16.0 g/dL Final  . HCT 07/28/2016 37.9  35.0 - 47.0 % Final  . MCV 07/28/2016 72.0* 80.0 - 100.0 fL Final  . MCH 07/28/2016 22.9* 26.0 - 34.0 pg Final  . MCHC 07/28/2016 31.8* 32.0 - 36.0 g/dL Final  . RDW 07/28/2016 18.2* 11.5 - 14.5 % Final  . Platelets 07/28/2016 757* 150 - 440 K/uL Final  . Neutrophils Relative % 07/28/2016 79  % Final  . Neutro Abs 07/28/2016 13.5* 1.4 - 6.5 K/uL Final  . Lymphocytes Relative 07/28/2016 9  % Final  . Lymphs Abs 07/28/2016 1.5  1.0 - 3.6 K/uL Final  . Monocytes Relative 07/28/2016 6  % Final  . Monocytes Absolute 07/28/2016  1.0* 0.2 - 0.9 K/uL Final  . Eosinophils Relative 07/28/2016 3  % Final  . Eosinophils Absolute 07/28/2016 0.5  0 - 0.7 K/uL Final  . Basophils Relative 07/28/2016 3  % Final  . Basophils Absolute 07/28/2016 0.4* 0 - 0.1 K/uL Final  . Sodium 07/28/2016 136  135 - 145 mmol/L Final  . Potassium 07/28/2016 4.3  3.5 - 5.1 mmol/L Final  . Chloride 07/28/2016 100* 101 - 111 mmol/L Final  . CO2 07/28/2016 25  22 - 32 mmol/L Final  . Glucose, Bld 07/28/2016 208* 65 - 99 mg/dL Final  . BUN 07/28/2016 25* 6 - 20 mg/dL Final  . Creatinine, Ser 07/28/2016 0.96  0.44 - 1.00 mg/dL Final  . Calcium 07/28/2016 9.3  8.9 - 10.3 mg/dL Final  . Total Protein 07/28/2016 6.9  6.5 - 8.1 g/dL Final  . Albumin 07/28/2016 4.1  3.5 - 5.0 g/dL Final  . AST 07/28/2016 22  15 - 41 U/L Final  . ALT 07/28/2016 15  14 - 54 U/L Final  . Alkaline Phosphatase 07/28/2016 40  38 - 126 U/L Final  . Total Bilirubin 07/28/2016 0.5  0.3 - 1.2 mg/dL Final  . GFR calc non Af Amer 07/28/2016 54* >60 mL/min Final  . GFR calc Af Amer 07/28/2016 >60  >60 mL/min Final   Comment: (NOTE) The eGFR has been calculated using the CKD EPI equation. This calculation has not been validated in all clinical situations. eGFR's persistently <60 mL/min signify possible Chronic Kidney Disease.   . Anion gap 07/28/2016 11  5 - 15 Final     Assessment:  MAKINZE JANI is a 83 y.o. female with JAK2+ polycythemia rubra vera (PV) diagnosed in 2008. She has undergone low phlebotomies x 2-3 to maintain a target hematocrit is 40-42.5.   She is on hydroxyurea 500 mg (1 pill) on Mondays, Wednesdays, Fridays and Saturdays (4 pills/week). Screening for von Willebrand and a platelet function was normal. She is on a baby aspirin. She has been on Plavix since her TIA/CVA.   She has a history of iron deficiency anemia status post GI evaluation. Colonoscopy in 07/2007 was negative. For persistent recurrent iron deficiency anemia, she underwent EGD and  colonoscopy in 12/2011 and 09/11/2013.  She has a history of carcinoid tumor after presenting with  a partial small bowel obstruction.  Abdominal and pelvic CT scan on 03/22/2015 revealed a 3 x 4.9 cm apple core lesion involving a loop of small bowel in the right mid abdomen. There was associated 4 x 1.8 cm adjacent mesenteric implant. Ileocolectomy on 03/28/2015 revealed a grade I neuroendocrine tumor (carcinoid) involving the terminal ileum and right colon.  Largest tumor was 2.5 cm. There were multiple adjacent nodules of a neuroendocrine tumor. Metastasis was in 9 of 12 lymph nodes.  Pathologic stage was T3N1.  Chromogranin was 4 on 05/07/2015 and 3 on 04/13/2016.   24 hour urine for 5HIAA was 5.2 mg/24 hours (0-14.9).  Octreotide scan on 05/31/2015 revealed no evidence of metastatic disease.  There was an indeterminate 9 mm nodule in the left apex.    Chest CT on 06/11/2015 revealed an 8 mm nodular density (spiculated on axial images) in the left apex. Chest CT on 10/10/2015 revealed a stable 8 mm pulmonary nodule in the anterior left lung apex.  Chest, abdomen and pelvic CT scan on 04/20/2016 revealed no definite evidence of recurrent or metastatic carcinoid tumor. There are no acute findings. There was a stable 8 mm pulmonary nodule in the left apex recommendations (follow-up CT in 12 months). 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 Recommendation was continued attention on follow-up or follow-up with abdominal MRI with and without contrast in 2 years.  Symptomatically, she is fatigued. She is getting over a cold. Exam is stable.  Platelet count is 757,000.  Plan: 1.  Labs today:  CBC with diff, CMP, chromogranin.  2.  Increase hydroxyurea to 5 pills/week. Patient to choose additional day. 3.  RTC in 2 weeks for labs (CBC with diff). 4.  RTC in 1 month for MD assessment, labs (CBC with diff, CMP).  The  patient was seen and examined.  The assessment and plan was discussed with the patient.  We discussed slight adjustment in her hydroxyurea (1 pill/week) and close monitoring of her counts.  Her increased platelet count may be an acute phase reactant secondary to her URI.  Multiple questions were asked and answered.    Faythe Casa, NP  Lequita Asal, MD  07/28/2016, 12:55 PM

## 2016-08-11 ENCOUNTER — Inpatient Hospital Stay: Payer: Medicare Other

## 2016-08-11 DIAGNOSIS — D3A029 Benign carcinoid tumor of the large intestine, unspecified portion: Secondary | ICD-10-CM

## 2016-08-11 DIAGNOSIS — R911 Solitary pulmonary nodule: Secondary | ICD-10-CM

## 2016-08-11 DIAGNOSIS — D45 Polycythemia vera: Secondary | ICD-10-CM

## 2016-08-11 LAB — CBC WITH DIFFERENTIAL/PLATELET
Basophils Absolute: 0.3 10*3/uL — ABNORMAL HIGH (ref 0–0.1)
Basophils Relative: 3 %
Eosinophils Absolute: 0.3 10*3/uL (ref 0–0.7)
Eosinophils Relative: 3 %
HCT: 38.4 % (ref 35.0–47.0)
Hemoglobin: 12 g/dL (ref 12.0–16.0)
Lymphocytes Relative: 11 %
Lymphs Abs: 1.3 10*3/uL (ref 1.0–3.6)
MCH: 22.7 pg — ABNORMAL LOW (ref 26.0–34.0)
MCHC: 31.3 g/dL — ABNORMAL LOW (ref 32.0–36.0)
MCV: 72.6 fL — ABNORMAL LOW (ref 80.0–100.0)
Monocytes Absolute: 0.6 10*3/uL (ref 0.2–0.9)
Monocytes Relative: 5 %
Neutro Abs: 9.5 10*3/uL — ABNORMAL HIGH (ref 1.4–6.5)
Neutrophils Relative %: 78 %
Platelets: 295 10*3/uL (ref 150–440)
RBC: 5.29 MIL/uL — ABNORMAL HIGH (ref 3.80–5.20)
RDW: 18.3 % — ABNORMAL HIGH (ref 11.5–14.5)
WBC: 12 10*3/uL — ABNORMAL HIGH (ref 3.6–11.0)

## 2016-08-12 ENCOUNTER — Telehealth: Payer: Self-pay | Admitting: *Deleted

## 2016-08-12 NOTE — Telephone Encounter (Signed)
Asking for lab results from yesterday. Please return her call [phone]

## 2016-08-12 NOTE — Telephone Encounter (Signed)
  Please call patient with results.  M 

## 2016-08-13 ENCOUNTER — Telehealth: Payer: Self-pay | Admitting: *Deleted

## 2016-08-13 NOTE — Telephone Encounter (Signed)
Attempted to call patient with lab results.  No answer.  Will try again later.

## 2016-08-13 NOTE — Telephone Encounter (Signed)
Called patient back and reviewed lab results with her.

## 2016-08-25 ENCOUNTER — Inpatient Hospital Stay: Payer: Medicare Other

## 2016-08-25 ENCOUNTER — Inpatient Hospital Stay: Payer: Medicare Other | Admitting: Hematology and Oncology

## 2016-08-31 DIAGNOSIS — M170 Bilateral primary osteoarthritis of knee: Secondary | ICD-10-CM | POA: Insufficient documentation

## 2016-09-07 ENCOUNTER — Inpatient Hospital Stay (HOSPITAL_BASED_OUTPATIENT_CLINIC_OR_DEPARTMENT_OTHER): Payer: Medicare Other | Admitting: Hematology and Oncology

## 2016-09-07 ENCOUNTER — Other Ambulatory Visit: Payer: Self-pay | Admitting: *Deleted

## 2016-09-07 ENCOUNTER — Inpatient Hospital Stay: Payer: Medicare Other | Attending: Hematology and Oncology

## 2016-09-07 ENCOUNTER — Encounter: Payer: Self-pay | Admitting: Hematology and Oncology

## 2016-09-07 VITALS — BP 115/73 | HR 71 | Temp 96.7°F | Resp 18 | Ht 61.0 in | Wt 137.3 lb

## 2016-09-07 DIAGNOSIS — E790 Hyperuricemia without signs of inflammatory arthritis and tophaceous disease: Secondary | ICD-10-CM | POA: Insufficient documentation

## 2016-09-07 DIAGNOSIS — D3A029 Benign carcinoid tumor of the large intestine, unspecified portion: Secondary | ICD-10-CM

## 2016-09-07 DIAGNOSIS — M7060 Trochanteric bursitis, unspecified hip: Secondary | ICD-10-CM

## 2016-09-07 DIAGNOSIS — Z8673 Personal history of transient ischemic attack (TIA), and cerebral infarction without residual deficits: Secondary | ICD-10-CM | POA: Diagnosis not present

## 2016-09-07 DIAGNOSIS — Z85028 Personal history of other malignant neoplasm of stomach: Secondary | ICD-10-CM | POA: Diagnosis not present

## 2016-09-07 DIAGNOSIS — D45 Polycythemia vera: Secondary | ICD-10-CM | POA: Diagnosis not present

## 2016-09-07 DIAGNOSIS — Z853 Personal history of malignant neoplasm of breast: Secondary | ICD-10-CM | POA: Insufficient documentation

## 2016-09-07 DIAGNOSIS — Z79899 Other long term (current) drug therapy: Secondary | ICD-10-CM | POA: Diagnosis not present

## 2016-09-07 DIAGNOSIS — Z85828 Personal history of other malignant neoplasm of skin: Secondary | ICD-10-CM | POA: Diagnosis not present

## 2016-09-07 DIAGNOSIS — I10 Essential (primary) hypertension: Secondary | ICD-10-CM | POA: Insufficient documentation

## 2016-09-07 DIAGNOSIS — Z87891 Personal history of nicotine dependence: Secondary | ICD-10-CM | POA: Diagnosis not present

## 2016-09-07 DIAGNOSIS — E119 Type 2 diabetes mellitus without complications: Secondary | ICD-10-CM | POA: Diagnosis not present

## 2016-09-07 DIAGNOSIS — Z8541 Personal history of malignant neoplasm of cervix uteri: Secondary | ICD-10-CM | POA: Insufficient documentation

## 2016-09-07 DIAGNOSIS — R5383 Other fatigue: Secondary | ICD-10-CM | POA: Diagnosis not present

## 2016-09-07 DIAGNOSIS — Z7984 Long term (current) use of oral hypoglycemic drugs: Secondary | ICD-10-CM | POA: Insufficient documentation

## 2016-09-07 DIAGNOSIS — Z806 Family history of leukemia: Secondary | ICD-10-CM | POA: Diagnosis not present

## 2016-09-07 DIAGNOSIS — Z8542 Personal history of malignant neoplasm of other parts of uterus: Secondary | ICD-10-CM | POA: Insufficient documentation

## 2016-09-07 DIAGNOSIS — Z7982 Long term (current) use of aspirin: Secondary | ICD-10-CM

## 2016-09-07 DIAGNOSIS — R911 Solitary pulmonary nodule: Secondary | ICD-10-CM

## 2016-09-07 LAB — CBC WITH DIFFERENTIAL/PLATELET
Basophils Absolute: 0.3 10*3/uL — ABNORMAL HIGH (ref 0–0.1)
Basophils Relative: 3 %
Eosinophils Absolute: 0.2 10*3/uL (ref 0–0.7)
Eosinophils Relative: 2 %
HCT: 35.8 % (ref 35.0–47.0)
Hemoglobin: 11.4 g/dL — ABNORMAL LOW (ref 12.0–16.0)
Lymphocytes Relative: 12 %
Lymphs Abs: 1.3 10*3/uL (ref 1.0–3.6)
MCH: 23.4 pg — ABNORMAL LOW (ref 26.0–34.0)
MCHC: 31.8 g/dL — ABNORMAL LOW (ref 32.0–36.0)
MCV: 73.5 fL — ABNORMAL LOW (ref 80.0–100.0)
Monocytes Absolute: 0.6 10*3/uL (ref 0.2–0.9)
Monocytes Relative: 5 %
Neutro Abs: 8.4 10*3/uL — ABNORMAL HIGH (ref 1.4–6.5)
Neutrophils Relative %: 78 %
Platelets: 300 10*3/uL (ref 150–440)
RBC: 4.87 MIL/uL (ref 3.80–5.20)
RDW: 18.7 % — ABNORMAL HIGH (ref 11.5–14.5)
WBC: 10.9 10*3/uL (ref 3.6–11.0)

## 2016-09-07 LAB — COMPREHENSIVE METABOLIC PANEL
ALT: 14 U/L (ref 14–54)
AST: 25 U/L (ref 15–41)
Albumin: 4 g/dL (ref 3.5–5.0)
Alkaline Phosphatase: 38 U/L (ref 38–126)
Anion gap: 9 (ref 5–15)
BUN: 23 mg/dL — ABNORMAL HIGH (ref 6–20)
CO2: 25 mmol/L (ref 22–32)
Calcium: 9.3 mg/dL (ref 8.9–10.3)
Chloride: 100 mmol/L — ABNORMAL LOW (ref 101–111)
Creatinine, Ser: 1.09 mg/dL — ABNORMAL HIGH (ref 0.44–1.00)
GFR calc Af Amer: 53 mL/min — ABNORMAL LOW (ref >60)
GFR calc non Af Amer: 46 mL/min — ABNORMAL LOW (ref >60)
Glucose, Bld: 209 mg/dL — ABNORMAL HIGH (ref 65–99)
Potassium: 4.2 mmol/L (ref 3.5–5.1)
Sodium: 134 mmol/L — ABNORMAL LOW (ref 135–145)
Total Bilirubin: 0.5 mg/dL (ref 0.3–1.2)
Total Protein: 6.8 g/dL (ref 6.5–8.1)

## 2016-09-07 NOTE — Progress Notes (Signed)
Pt states that she had carcinoid and at times she has diarrhea. Last night she ate nuts and had diarrhea and again this am. She wants to know if that carcinoid and nuts irritate it.  She also had a cold last month an when she would caough she has urinary incontinence and now she is finding when she coughs she has leakage of bowels and wants to know if carcinoid, or her hx of cervical ca would be a cause.

## 2016-09-07 NOTE — Progress Notes (Signed)
[facility] day:  09/07/16  Chief Complaint: [name] is a 83 y.o. 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  HPI:  The patient was last seen in the medical oncology clinic on 07/28/2016.  At that time, she was fatigued.  Platelet count was 757,000.  Hydroxyurea was increased to 5 pills/week.  CBC on 08/11/2016 revealed a hematocrit of 38.4, hemoglobin 12.0, platelet count 295,000, WBC 12,000 with an ANC of 9500.  She notes some interval diarrhea, but not every night.  She took 2 anti-diarrheal tablets.  She thinks it may be related to tomatoes or possibly nuts.  She notes bladder leakage when coughing.   Past Medical History:  Diagnosis Date  . Adenocarcinoma in situ of cervix   . Arthritis    hands  . Breast cancer ([place])   . Colon cancer ([place])   . Diabetes mellitus without complication ([place])   . Endometrial carcinoma (HCC)    s/p total abdominal hysterectomy  . H/O compression fracture of spine 2014   thoracic spine  . H/O polycythemia vera   . H/O TIA (transient ischemic attack) and stroke 09/2014, 03/2015   No deficits  . Hypertension   . Hyperuricemia   . Microalbuminuria   . Polycythemia vera ([place])   . Recurrent falls   . Skin cancer    face, legs  . Stroke [facility]) 2008   no deficits  . Trochanteric bursitis   . Varicose veins    treated    Past Surgical History:  Procedure Laterality Date  . ABDOMINAL HYSTERECTOMY    . BOWEL RESECTION N/A 03/28/2015   Procedure: SMALL BOWEL RESECTION;  Surgeon: Leonie Green, MD;  Location: [facility] ORS;  Service: General;  Laterality: N/A;  . CATARACT EXTRACTION W/ INTRAOCULAR LENS IMPLANT Right   . CATARACT EXTRACTION W/PHACO Left 10/07/2015   Procedure: CATARACT EXTRACTION PHACO AND INTRAOCULAR LENS PLACEMENT (IOC);  Surgeon: Ronnell Freshwater, MD;  Location: [place];  Service: Ophthalmology;  Laterality: Left;  DIABETIC - oral meds VISION BLUE  . EXPLORATORY LAPAROTOMY     for fibroids  . TONSILLECTOMY      Family History  Problem Relation Age of Onset  . Cancer Brother     AML  . Heart attack Mother   . Breast cancer Mother   . Heart attack Father   . Heart attack Sister   . Diabetes Sister   Her brother died of AML.  Social History:  reports that she has quit smoking. She has never used smokeless tobacco. She reports that she does not drink alcohol or use drugs.  The patient's husband died of AML.  She has no family in [place]. 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 The patient is alone today.  Allergies:  Allergies  Allergen Reactions  . Simvastatin     Other reaction(s): Muscle Pain    Current Medications: Current Outpatient Prescriptions  Medication Sig Dispense Refill  . aspirin EC 81 MG tablet Take 81 mg by mouth daily.    . calcium-vitamin D (OSCAL WITH D) 500-200 MG-UNIT per tablet Take 1 tablet by mouth daily.     . carvedilol (COREG) 6.25 MG tablet Take 6.25 mg by mouth 2 (two) times daily with a meal.    . chlorthalidone (HYGROTON) 25 MG  tablet Take 25 mg by mouth daily.    . clopidogrel (PLAVIX) 75 MG tablet Take 1 tablet (75 mg total) by mouth daily. 30 tablet 0  . fexofenadine (ALLEGRA) 180 MG tablet Take 180 mg by mouth daily as needed. Reported on 10/07/2015    . hydroxyurea (HYDREA) 500 MG capsule 500 mg (1 pill) on Mondays, Wednesdays, Fridays and Saturdays.May take with food to minimize GI side effects. 45 capsule 0  . loperamide (IMODIUM) 2 MG capsule Take 4 mg by mouth as needed for diarrhea or loose stools.    . metFORMIN (GLUCOPHAGE) 1000 MG tablet Take 1,000 mg by mouth 2 (two) times daily.     . potassium chloride (K-DUR,KLOR-CON) 10 MEQ tablet Take 10 mEq by mouth daily.    . pravastatin (PRAVACHOL) 20 MG tablet Take 20 mg by mouth at bedtime.      . Probiotic Product (PROBIOTIC DAILY PO) Take 1 tablet by mouth daily.    . ramipril (ALTACE) 10 MG capsule Take 10 mg by mouth 2 (two) times daily.     . verapamil (CALAN-SR) 240 MG CR tablet Take 240 mg by mouth daily.    . citalopram (CELEXA) 10 MG tablet Take 10 mg by mouth daily.    . polyethylene glycol (MIRALAX / GLYCOLAX) packet Take 17 g by mouth daily as needed.     No current facility-administered medications for this visit.     Review of Systems:  GENERAL: Feels "ok". No fevers or sweats.  Weight down 1 pound. PERFORMANCE STATUS (ECOG): 1 HEENT: No visual changes, runny nose, sore throat, mouth sores or tenderness. Lungs: No shortness of breath or cough. No hemoptysis. Cardiac: No chest pain, palpitations, orthopnea, or PND. PP:IRJJOACZ possibly related to dietary issues (see HPI).  No nausea, vomiting, melena or hematochezia. GU: No urgency, frequency, dysuria, or hematuria.  Bladder leakage. Musculoskeletal: No back pain. No joint pain. No muscle tenderness. Extremities: No pain or swelling. Skin: No rashes or skin changes. Neuro: No headache, numbness or weakness, balance or coordination issues. Endocrine: Diabetes.  No thyroid issues, hot flashes or night sweats. Psych: No mood changes, depression or anxiety. Pain: No focal pain. Review of systems: All other systems reviewed and found to be negative.  Physical Exam: Blood pressure 115/73, pulse 71, temperature (!) 96.7 F (35.9 C), temperature source Tympanic, resp. rate 18, height 5' 1"  (1.549 m), weight 137 lb 5.6 oz (62.3 kg).  GENERAL: Well developed, well nourished, woman sitting comfortably in the exam room in no acute distress MENTAL STATUS: Alert and oriented to person, place and time. HEAD: Short styled light brown hair. Normocephalic, atraumatic, face symmetric, no Cushingoid features. EYES: Glasses. Blue eyes s/p cataract surgery. Pupils equal round and reactive to light and  accomodation. No conjunctivitis or scleral icterus. ENT: Oropharynx clear without lesion. Tongue normal. Mucous membranes moist.  RESPIRATORY: Clear to auscultation without rales, wheezes or rhonchi. CARDIOVASCULAR: Regular rate and rhythm without murmur, rub or gallop. ABDOMEN: Soft, non-tender, with active bowel sounds, and no hepatosplenomegaly. No masses. SKIN: No rashes, ulcers or lesions. EXTREMITIES: No edema, no skin discoloration or tenderness. No palpable cords. LYMPH NODES: No palpable cervical, supraclavicular, axillary or inguinal adenopathy  NEUROLOGICAL: Unremarkable. PSYCH: Appropriate.    Appointment on 09/07/2016  Component Date Value Ref Range Status  . WBC 09/07/2016 10.9  3.6 - 11.0 K/uL Final  . RBC 09/07/2016 4.87  3.80 - 5.20 MIL/uL Final  . Hemoglobin 09/07/2016 11.4* 12.0 - 16.0 g/dL Final  .  HCT 09/07/2016 35.8  35.0 - 47.0 % Final  . MCV 09/07/2016 73.5* 80.0 - 100.0 fL Final  . MCH 09/07/2016 23.4* 26.0 - 34.0 pg Final  . MCHC 09/07/2016 31.8* 32.0 - 36.0 g/dL Final  . RDW 09/07/2016 18.7* 11.5 - 14.5 % Final  . Platelets 09/07/2016 300  150 - 440 K/uL Final  . Neutrophils Relative % 09/07/2016 78  % Final  . Neutro Abs 09/07/2016 8.4* 1.4 - 6.5 K/uL Final  . Lymphocytes Relative 09/07/2016 12  % Final  . Lymphs Abs 09/07/2016 1.3  1.0 - 3.6 K/uL Final  . Monocytes Relative 09/07/2016 5  % Final  . Monocytes Absolute 09/07/2016 0.6  0.2 - 0.9 K/uL Final  . Eosinophils Relative 09/07/2016 2  % Final  . Eosinophils Absolute 09/07/2016 0.2  0 - 0.7 K/uL Final  . Basophils Relative 09/07/2016 3  % Final  . Basophils Absolute 09/07/2016 0.3* 0 - 0.1 K/uL Final  . Sodium 09/07/2016 134* 135 - 145 mmol/L Final  . Potassium 09/07/2016 4.2  3.5 - 5.1 mmol/L Final  . Chloride 09/07/2016 100* 101 - 111 mmol/L Final  . CO2 09/07/2016 25  22 - 32 mmol/L Final  . Glucose, Bld 09/07/2016 209* 65 - 99 mg/dL Final  . BUN 09/07/2016 23* 6 - 20 mg/dL Final   . Creatinine, Ser 09/07/2016 1.09* 0.44 - 1.00 mg/dL Final  . Calcium 09/07/2016 9.3  8.9 - 10.3 mg/dL Final  . Total Protein 09/07/2016 6.8  6.5 - 8.1 g/dL Final  . Albumin 09/07/2016 4.0  3.5 - 5.0 g/dL Final  . AST 09/07/2016 25  15 - 41 U/L Final  . ALT 09/07/2016 14  14 - 54 U/L Final  . Alkaline Phosphatase 09/07/2016 38  38 - 126 U/L Final  . Total Bilirubin 09/07/2016 0.5  0.3 - 1.2 mg/dL Final  . GFR calc non Af Amer 09/07/2016 46* >60 mL/min Final  . GFR calc Af Amer 09/07/2016 53* >60 mL/min Final   Comment: (NOTE) The eGFR has been calculated using the CKD EPI equation. This calculation has not been validated in all clinical situations. eGFR's persistently <60 mL/min signify possible Chronic Kidney Disease.   . Anion gap 09/07/2016 9  5 - 15 Final     Assessment:  [name] is a 83 y.o. female with JAK2+ polycythemia rubra vera (PV) diagnosed in 2008. She has undergone low phlebotomies x 2-3 to maintain a target hematocrit is 40-42.5.   She is on hydroxyurea 500 mg (1 pill) on Mondays, Wednesdays, Fridays and Saturdays (5 pills/week). Screening for von Willebrand and a platelet function was normal. She is on a baby aspirin. She has been on Plavix since her TIA/CVA.   She has a history of iron deficiency anemia status post GI evaluation. Colonoscopy in 07/2007 was negative. For persistent recurrent iron deficiency anemia, she underwent EGD and colonoscopy in 12/2011 and 09/11/2013.  She has a history of carcinoid tumor after presenting with a partial small bowel obstruction.  Abdominal and pelvic CT scan on 03/22/2015 revealed a 3 x 4.9 cm apple core lesion involving a loop of small bowel in the right mid abdomen. There was associated 4 x 1.8 cm adjacent mesenteric implant. Ileocolectomy on 03/28/2015 revealed a grade I neuroendocrine tumor (carcinoid) involving the terminal ileum and right colon.  Largest tumor was 2.5 cm. There were multiple adjacent nodules of a  neuroendocrine tumor. Metastasis was in 9 of 12 lymph nodes.  Pathologic stage was T3N1.  Chromogranin was 4 on 05/07/2015 and 3 on 04/13/2016.   24 hour urine for 5HIAA was 5.2 mg/24 hours (0-14.9).  Octreotide scan on 05/31/2015 revealed no evidence of metastatic disease.  There was an indeterminate 9 mm nodule in the left apex.    Chest CT on 06/11/2015 revealed an 8 mm nodular density (spiculated on axial images) in the left apex. Chest CT on 10/10/2015 revealed a stable 8 mm pulmonary nodule in the anterior left lung apex.  Chest, abdomen and pelvic CT scan on 04/20/2016 revealed no definite evidence of recurrent or metastatic carcinoid tumor. There are no acute findings. There was a stable 8 mm pulmonary nodule in the left apex recommendations (follow-up CT in 12 months). 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 Recommendation was continued attention on follow-up or follow-up with abdominal MRI with and without contrast in 2 years.  Symptomatically, she has intermittent diarrhea, possibly food related.  Exam is stable.  Platelet count is 300,000.  Plan: 1.  Labs today:  CBC with diff, CMP.  2.  Continue hydroxyurea to 5 pills/week.  3.  Discuss patient's concerns about recurrent carcinoid.  Discuss collecting 24 hour urine for 5HIAA and serum chromogranin.  Suspect etiology is secondary to diet.  Octreotide scan if testing is positive. 4.  Collect 24 hr urine for 5HIAA 5.  Anticipate chest and abdomen CT in 03/2017 to follow-up on pulmonary nodule and cystic pancreatic lesion. 6.  RTC in 2 months for MD assessment and labs (CBC with diff, CMP, chromogranin)   Lequita Asal, MD  09/07/2016, 2:32 PM

## 2016-09-08 DIAGNOSIS — D45 Polycythemia vera: Secondary | ICD-10-CM | POA: Diagnosis not present

## 2016-09-09 ENCOUNTER — Other Ambulatory Visit: Payer: Self-pay

## 2016-09-09 DIAGNOSIS — D3A029 Benign carcinoid tumor of the large intestine, unspecified portion: Secondary | ICD-10-CM

## 2016-09-12 LAB — 5 HIAA, QUANTITATIVE, URINE, 24 HOUR
5-HIAA, Ur: 3.4 mg/L
5-HIAA,Quant.,24 Hr Urine: 6.8 mg/24 hr (ref 0.0–14.9)
Total Volume: 2000

## 2016-09-14 ENCOUNTER — Telehealth: Payer: Self-pay | Admitting: *Deleted

## 2016-09-14 NOTE — Telephone Encounter (Signed)
Called patient and LVM that urine was negative for carcinoid.

## 2016-09-14 NOTE — Telephone Encounter (Signed)
-----   Message from Lequita Asal, MD sent at 09/12/2016  2:03 PM EDT ----- Regarding: Please call patient  Urine is negative for 5HIAA (marker for carcinoid).  M  ----- Message ----- From: Interface, Lab In [place] Sent: 09/12/2016  11:37 AM To: Lequita Asal, MD

## 2016-09-17 ENCOUNTER — Telehealth: Payer: Self-pay | Admitting: *Deleted

## 2016-09-17 NOTE — Telephone Encounter (Signed)
Called patient to inform her that her labs look good.  Her platelets have stabilized over the past month.

## 2016-09-17 NOTE — Telephone Encounter (Signed)
-----   Message from Lequita Asal, MD sent at 09/16/2016  5:37 PM EDT ----- Regarding: Please call patient  Counts look good.  Platelet count stable over past month.  M  ----- Message ----- From: Interface, Lab In [place] Sent: 08/11/2016  10:59 AM To: Lequita Asal, MD

## 2016-11-02 ENCOUNTER — Other Ambulatory Visit: Payer: Self-pay | Admitting: Hematology and Oncology

## 2016-11-09 ENCOUNTER — Inpatient Hospital Stay: Payer: Medicare Other | Attending: Hematology and Oncology

## 2016-11-09 ENCOUNTER — Encounter: Payer: Self-pay | Admitting: Hematology and Oncology

## 2016-11-09 ENCOUNTER — Inpatient Hospital Stay (HOSPITAL_BASED_OUTPATIENT_CLINIC_OR_DEPARTMENT_OTHER): Payer: Medicare Other | Admitting: Hematology and Oncology

## 2016-11-09 VITALS — BP 139/73 | HR 76 | Temp 98.9°F | Resp 18 | Wt 137.1 lb

## 2016-11-09 DIAGNOSIS — Z853 Personal history of malignant neoplasm of breast: Secondary | ICD-10-CM

## 2016-11-09 DIAGNOSIS — Z7982 Long term (current) use of aspirin: Secondary | ICD-10-CM | POA: Diagnosis not present

## 2016-11-09 DIAGNOSIS — Z7984 Long term (current) use of oral hypoglycemic drugs: Secondary | ICD-10-CM | POA: Insufficient documentation

## 2016-11-09 DIAGNOSIS — Z8542 Personal history of malignant neoplasm of other parts of uterus: Secondary | ICD-10-CM

## 2016-11-09 DIAGNOSIS — E790 Hyperuricemia without signs of inflammatory arthritis and tophaceous disease: Secondary | ICD-10-CM | POA: Insufficient documentation

## 2016-11-09 DIAGNOSIS — Z79899 Other long term (current) drug therapy: Secondary | ICD-10-CM | POA: Diagnosis not present

## 2016-11-09 DIAGNOSIS — Z8541 Personal history of malignant neoplasm of cervix uteri: Secondary | ICD-10-CM | POA: Diagnosis not present

## 2016-11-09 DIAGNOSIS — Z85828 Personal history of other malignant neoplasm of skin: Secondary | ICD-10-CM | POA: Diagnosis not present

## 2016-11-09 DIAGNOSIS — Z87891 Personal history of nicotine dependence: Secondary | ICD-10-CM | POA: Diagnosis not present

## 2016-11-09 DIAGNOSIS — D3A029 Benign carcinoid tumor of the large intestine, unspecified portion: Secondary | ICD-10-CM

## 2016-11-09 DIAGNOSIS — Z8673 Personal history of transient ischemic attack (TIA), and cerebral infarction without residual deficits: Secondary | ICD-10-CM | POA: Insufficient documentation

## 2016-11-09 DIAGNOSIS — E119 Type 2 diabetes mellitus without complications: Secondary | ICD-10-CM | POA: Diagnosis not present

## 2016-11-09 DIAGNOSIS — I10 Essential (primary) hypertension: Secondary | ICD-10-CM | POA: Insufficient documentation

## 2016-11-09 DIAGNOSIS — D45 Polycythemia vera: Secondary | ICD-10-CM | POA: Diagnosis not present

## 2016-11-09 DIAGNOSIS — Z85028 Personal history of other malignant neoplasm of stomach: Secondary | ICD-10-CM

## 2016-11-09 DIAGNOSIS — M7060 Trochanteric bursitis, unspecified hip: Secondary | ICD-10-CM | POA: Diagnosis not present

## 2016-11-09 LAB — COMPREHENSIVE METABOLIC PANEL
ALT: 14 U/L (ref 14–54)
AST: 22 U/L (ref 15–41)
Albumin: 3.9 g/dL (ref 3.5–5.0)
Alkaline Phosphatase: 43 U/L (ref 38–126)
Anion gap: 9 (ref 5–15)
BUN: 24 mg/dL — ABNORMAL HIGH (ref 6–20)
CO2: 25 mmol/L (ref 22–32)
Calcium: 9.2 mg/dL (ref 8.9–10.3)
Chloride: 101 mmol/L (ref 101–111)
Creatinine, Ser: 1.11 mg/dL — ABNORMAL HIGH (ref 0.44–1.00)
GFR calc Af Amer: 52 mL/min — ABNORMAL LOW (ref >60)
GFR calc non Af Amer: 45 mL/min — ABNORMAL LOW (ref >60)
Glucose, Bld: 204 mg/dL — ABNORMAL HIGH (ref 65–99)
Potassium: 3.9 mmol/L (ref 3.5–5.1)
Sodium: 135 mmol/L (ref 135–145)
Total Bilirubin: 0.7 mg/dL (ref 0.3–1.2)
Total Protein: 6.6 g/dL (ref 6.5–8.1)

## 2016-11-09 LAB — CBC WITH DIFFERENTIAL/PLATELET
Basophils Absolute: 0.3 10*3/uL — ABNORMAL HIGH (ref 0–0.1)
Basophils Relative: 2 %
Eosinophils Absolute: 0.2 10*3/uL (ref 0–0.7)
Eosinophils Relative: 2 %
HCT: 36.6 % (ref 35.0–47.0)
Hemoglobin: 11.7 g/dL — ABNORMAL LOW (ref 12.0–16.0)
Lymphocytes Relative: 10 %
Lymphs Abs: 1.3 10*3/uL (ref 1.0–3.6)
MCH: 24.3 pg — ABNORMAL LOW (ref 26.0–34.0)
MCHC: 31.9 g/dL — ABNORMAL LOW (ref 32.0–36.0)
MCV: 76.4 fL — ABNORMAL LOW (ref 80.0–100.0)
Monocytes Absolute: 0.9 10*3/uL (ref 0.2–0.9)
Monocytes Relative: 7 %
Neutro Abs: 9.7 10*3/uL — ABNORMAL HIGH (ref 1.4–6.5)
Neutrophils Relative %: 79 %
Platelets: 529 10*3/uL — ABNORMAL HIGH (ref 150–440)
RBC: 4.8 MIL/uL (ref 3.80–5.20)
RDW: 18.6 % — ABNORMAL HIGH (ref 11.5–14.5)
WBC: 12.3 10*3/uL — ABNORMAL HIGH (ref 3.6–11.0)

## 2016-11-09 NOTE — Progress Notes (Signed)
[facility] day:  11/09/16  Chief Complaint: [name] is a 83 y.o. 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  HPI:  The patient was last seen in the medical oncology clinic on 09/07/2016.  At that time, she has intermittent diarrhea, possibly food related.  Exam was stable.CBC revealed a hematocrit of 35.8, hemoglobin 11.4, MCV 73.5, platelets 300,000, WBC 10,900 with an ANC of 8400.  24 hour urine for 5-HIAA revealed 6.8 mg/24 hr on 09/08/2016.  She continued hydroxyurea 5 tablets/week.  She notes a little diarrhea "now and then" (every 1-2 weeks).    She has had an allergy problem since Christmas.  She continues hydroxyurea 5 days/week (Mon, Tues, Wed, Fri, Sat).    Past Medical History:  Diagnosis Date  . Adenocarcinoma in situ of cervix   . Arthritis    hands  . Breast cancer ([place])   . Colon cancer ([place])   . Diabetes mellitus without complication ([place])   . Endometrial carcinoma (H[facility])    s/p total abdominal hysterectomy  . H/O compression fracture of spine 2014   thoracic spine  . H/O polycythemia vera   . H/O TIA (transient ischemic attack) and stroke 09/2014, 03/2015   No deficits  . Hypertension   . Hyperuricemia   . Microalbuminuria   . Polycythemia vera ([place])   . Recurrent falls   . Skin cancer    face, legs  . Stroke [facility]) 2008   no deficits  . Trochanteric bursitis   . Varicose veins    treated    Past Surgical History:  Procedure Laterality Date  . ABDOMINAL HYSTERECTOMY    . BOWEL RESECTION N/A 03/28/2015   Procedure: SMALL BOWEL RESECTION;  Surgeon: Leonie Green, MD;  Location: [facility] ORS;  Service: General;  Laterality: N/A;  . CATARACT EXTRACTION W/ INTRAOCULAR LENS IMPLANT Right   . CATARACT EXTRACTION W/PHACO Left 10/07/2015   Procedure: CATARACT EXTRACTION PHACO AND INTRAOCULAR LENS PLACEMENT  (IOC);  Surgeon: Ronnell Freshwater, MD;  Location: [facility];  Service: Ophthalmology;  Laterality: Left;  DIABETIC - oral meds VISION BLUE  . EXPLORATORY LAPAROTOMY     for fibroids  . TONSILLECTOMY      Family History  Problem Relation Age of Onset  . Cancer Brother        AML  . Heart attack Mother   . Breast cancer Mother   . Heart attack Father   . Heart attack Sister   . Diabetes Sister   Her brother died of AML.  Social History:  reports that she has quit smoking. She has never used smokeless tobacco. She reports that she does not drink alcohol or use drugs.  The patient's husband died of AML.  She has no family in [place]. 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 The patient is alone today.  Allergies:  Allergies  Allergen Reactions  . Simvastatin     Other reaction(s): Muscle Pain    Current Medications: Current Outpatient Prescriptions  Medication Sig Dispense Refill  . aspirin EC 81 MG tablet Take 81 mg by mouth daily.    . calcium-vitamin D (OSCAL WITH D) 500-200 MG-UNIT per tablet Take 1 tablet by mouth daily.     . carvedilol (COREG) 6.25 MG tablet Take 6.25 mg by mouth 2 (  two) times daily with a meal.    . chlorthalidone (HYGROTON) 25 MG tablet Take 25 mg by mouth daily.    . clopidogrel (PLAVIX) 75 MG tablet Take 1 tablet (75 mg total) by mouth daily. 30 tablet 0  . fexofenadine (ALLEGRA) 180 MG tablet Take 180 mg by mouth daily as needed. Reported on 10/07/2015    . hydroxyurea (HYDREA) 500 MG capsule TAKE 1 CAPSULE ON MON WED FRI AND SAT.TAKE WITH FOOD 45 capsule 1  . loperamide (IMODIUM) 2 MG capsule Take 4 mg by mouth as needed for diarrhea or loose stools.    . metFORMIN (GLUCOPHAGE) 1000 MG tablet Take 1,000 mg by mouth 2 (two) times daily.     . polyethylene glycol (MIRALAX / GLYCOLAX) packet Take 17 g by mouth daily as needed.    . potassium chloride (K-DUR,KLOR-CON) 10 MEQ tablet  Take 10 mEq by mouth daily.    . pravastatin (PRAVACHOL) 20 MG tablet Take 20 mg by mouth at bedtime.     . Probiotic Product (PROBIOTIC DAILY PO) Take 1 tablet by mouth daily.    . ramipril (ALTACE) 10 MG capsule Take 10 mg by mouth 2 (two) times daily.     . verapamil (CALAN-SR) 240 MG CR tablet Take 240 mg by mouth daily.    . citalopram (CELEXA) 10 MG tablet Take 10 mg by mouth daily.     No current facility-administered medications for this visit.     Review of Systems:  GENERAL: Less stamina. No fevers or sweats.  Weight stable. PERFORMANCE STATUS (ECOG): 1 HEENT: No visual changes, runny nose, sore throat, mouth sores or tenderness. Lungs: No shortness of breath or cough. No hemoptysis. Cardiac: No chest pain, palpitations, orthopnea, or PND. GI: Intermittent diarrhea (see HPI).  No nausea, vomiting, melena or hematochezia. GU: No urgency, frequency, dysuria, or hematuria.  Bladder leakage. Musculoskeletal: No back pain. No joint pain. No muscle tenderness. Extremities: No pain or swelling. Skin: No rashes or skin changes. Neuro: No headache, numbness or weakness, balance or coordination issues. Endocrine: Diabetes.  No thyroid issues, hot flashes or night sweats. Psych: No mood changes, depression or anxiety. Pain: No focal pain. Review of systems: All other systems reviewed and found to be negative.  Physical Exam:  Blood pressure 139/73, pulse 76, temperature 98.9 F (37.2 C), temperature source Tympanic, resp. rate 18, weight 137 lb 2 oz (62.2 kg).  GENERAL: Well developed, well nourished, woman sitting comfortably in the exam room in no acute distress MENTAL STATUS: Alert and oriented to person, place and time. HEAD: Short styled light brown hair. Normocephalic, atraumatic, face symmetric, no Cushingoid features. EYES: Glasses. Blue eyes s/p cataract surgery. Pupils equal round and reactive to light and accomodation. No conjunctivitis or scleral  icterus. ENT: Raspy voice.  Oropharynx clear without lesion. Tongue normal. Mucous membranes moist.  RESPIRATORY: Clear to auscultation without rales, wheezes or rhonchi. CARDIOVASCULAR: Regular rate and rhythm without murmur, rub or gallop. ABDOMEN: Soft, non-tender, with active bowel sounds, and no hepatosplenomegaly. No masses. SKIN: No rashes, ulcers or lesions. EXTREMITIES: No edema, no skin discoloration or tenderness. No palpable cords. LYMPH NODES: No palpable cervical, supraclavicular, axillary or inguinal adenopathy  NEUROLOGICAL: Unremarkable. PSYCH: Appropriate.    Appointment on 11/09/2016  Component Date Value Ref Range Status  . WBC 11/09/2016 12.3* 3.6 - 11.0 K/uL Final  . RBC 11/09/2016 4.80  3.80 - 5.20 MIL/uL Final  . Hemoglobin 11/09/2016 11.7* 12.0 - 16.0 g/dL Final  . HCT  11/09/2016 36.6  35.0 - 47.0 % Final  . MCV 11/09/2016 76.4* 80.0 - 100.0 fL Final  . MCH 11/09/2016 24.3* 26.0 - 34.0 pg Final  . MCHC 11/09/2016 31.9* 32.0 - 36.0 g/dL Final  . RDW 11/09/2016 18.6* 11.5 - 14.5 % Final  . Platelets 11/09/2016 529* 150 - 440 K/uL Final  . Neutrophils Relative % 11/09/2016 79  % Final  . Neutro Abs 11/09/2016 9.7* 1.4 - 6.5 K/uL Final  . Lymphocytes Relative 11/09/2016 10  % Final  . Lymphs Abs 11/09/2016 1.3  1.0 - 3.6 K/uL Final  . Monocytes Relative 11/09/2016 7  % Final  . Monocytes Absolute 11/09/2016 0.9  0.2 - 0.9 K/uL Final  . Eosinophils Relative 11/09/2016 2  % Final  . Eosinophils Absolute 11/09/2016 0.2  0 - 0.7 K/uL Final  . Basophils Relative 11/09/2016 2  % Final  . Basophils Absolute 11/09/2016 0.3* 0 - 0.1 K/uL Final  . Sodium 11/09/2016 135  135 - 145 mmol/L Final  . Potassium 11/09/2016 3.9  3.5 - 5.1 mmol/L Final  . Chloride 11/09/2016 101  101 - 111 mmol/L Final  . CO2 11/09/2016 25  22 - 32 mmol/L Final  . Glucose, Bld 11/09/2016 204* 65 - 99 mg/dL Final  . BUN 11/09/2016 24* 6 - 20 mg/dL Final  . Creatinine, Ser 11/09/2016  1.11* 0.44 - 1.00 mg/dL Final  . Calcium 11/09/2016 9.2  8.9 - 10.3 mg/dL Final  . Total Protein 11/09/2016 6.6  6.5 - 8.1 g/dL Final  . Albumin 11/09/2016 3.9  3.5 - 5.0 g/dL Final  . AST 11/09/2016 22  15 - 41 U/L Final  . ALT 11/09/2016 14  14 - 54 U/L Final  . Alkaline Phosphatase 11/09/2016 43  38 - 126 U/L Final  . Total Bilirubin 11/09/2016 0.7  0.3 - 1.2 mg/dL Final  . GFR calc non Af Amer 11/09/2016 45* >60 mL/min Final  . GFR calc Af Amer 11/09/2016 52* >60 mL/min Final   Comment: (NOTE) The eGFR has been calculated using the CKD EPI equation. This calculation has not been validated in all clinical situations. eGFR's persistently <60 mL/min signify possible Chronic Kidney Disease.   . Anion gap 11/09/2016 9  5 - 15 Final     Assessment:  [name] is a 83 y.o. female with JAK2+ polycythemia rubra vera (PV) diagnosed in 2008. She has undergone low phlebotomies x 2-3 to maintain a target hematocrit is 40-42.5.   She is on hydroxyurea 500 mg (1 pill) on Mondays, Wednesdays, Fridays and Saturdays (5 pills/week). Screening for von Willebrand and a platelet function was normal. She is on a baby aspirin. She has been on Plavix since her TIA/CVA.   She has a history of iron deficiency anemia status post GI evaluation. Colonoscopy in 07/2007 was negative. For persistent recurrent iron deficiency anemia, she underwent EGD and colonoscopy in 12/2011 and 09/11/2013.  She has a history of carcinoid tumor after presenting with a partial small bowel obstruction.  Abdominal and pelvic CT scan on 03/22/2015 revealed a 3 x 4.9 cm apple core lesion involving a loop of small bowel in the right mid abdomen. There was associated 4 x 1.8 cm adjacent mesenteric implant. Ileocolectomy on 03/28/2015 revealed a grade I neuroendocrine tumor (carcinoid) involving the terminal ileum and right colon.  Largest tumor was 2.5 cm. There were multiple adjacent nodules of a neuroendocrine tumor. Metastasis was  in 9 of 12 lymph nodes.  Pathologic stage was T3N1.  Chromogranin was 4 on 05/07/2015, 3 on 04/13/2016, and 4 on 11/09/2016.   24 hour urine for 5HIAA was 5.2 mg/24 hours (0-14.9).  Octreotide scan on 05/31/2015 revealed no evidence of metastatic disease.  There was an indeterminate 9 mm nodule in the left apex.    Chest CT on 06/11/2015 revealed an 8 mm nodular density (spiculated on axial images) in the left apex. Chest CT on 10/10/2015 revealed a stable 8 mm pulmonary nodule in the anterior left lung apex.  Chest, abdomen and pelvic CT scan on 04/20/2016 revealed no definite evidence of recurrent or metastatic carcinoid tumor. There are no acute findings. There was a stable 8 mm pulmonary nodule in the left apex recommendations (follow-up CT in 12 months). 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 Recommendation was continued attention on follow-up or follow-up with abdominal MRI with and without contrast in 2 years.  Symptomatically, she has intermittent diarrhea.  Exam is stable.  Platelet count is 529,000.  Plan: 1.  Labs today:  CBC with diff, CMP, chromagranin.  2.  Discuss platelet count.  Platelets have been stable for some time on current dose of hydroxyurea.  Continue hydroxyurea to 5 pills/week.   Recheck counts in 2 weeks and increase dose if persistently elevated. 3.  Anticipate chest and abdomen CT in 03/2017 to follow-up on pulmonary nodule and cystic pancreatic lesion. 4.  RTC in 2 weeks (CBC with diff). 5.  RTC in 3 months for MD assessment and labs (CBC with diff, CMP).   Lequita Asal, MD  11/09/2016, 2:48 PM

## 2016-11-09 NOTE — Progress Notes (Signed)
Patient states she does not have the stamina that she used to, but still works in her yard and does her housework.

## 2016-11-12 LAB — CHROMOGRANIN A: Chromogranin A: 4 nmol/L (ref 0–5)

## 2016-11-23 ENCOUNTER — Other Ambulatory Visit: Payer: Medicare Other

## 2016-12-21 ENCOUNTER — Encounter: Payer: Self-pay | Admitting: Internal Medicine

## 2017-01-09 ENCOUNTER — Other Ambulatory Visit: Payer: Self-pay | Admitting: Hematology and Oncology

## 2017-02-11 ENCOUNTER — Encounter: Payer: Self-pay | Admitting: Hematology and Oncology

## 2017-02-11 ENCOUNTER — Inpatient Hospital Stay (HOSPITAL_BASED_OUTPATIENT_CLINIC_OR_DEPARTMENT_OTHER): Payer: Medicare Other | Admitting: Hematology and Oncology

## 2017-02-11 ENCOUNTER — Inpatient Hospital Stay: Payer: Medicare Other | Attending: Hematology and Oncology

## 2017-02-11 VITALS — BP 112/67 | HR 74 | Temp 97.9°F | Resp 18 | Wt 138.1 lb

## 2017-02-11 DIAGNOSIS — Z7984 Long term (current) use of oral hypoglycemic drugs: Secondary | ICD-10-CM | POA: Insufficient documentation

## 2017-02-11 DIAGNOSIS — Z87891 Personal history of nicotine dependence: Secondary | ICD-10-CM

## 2017-02-11 DIAGNOSIS — M7060 Trochanteric bursitis, unspecified hip: Secondary | ICD-10-CM

## 2017-02-11 DIAGNOSIS — Z8541 Personal history of malignant neoplasm of cervix uteri: Secondary | ICD-10-CM

## 2017-02-11 DIAGNOSIS — Z8673 Personal history of transient ischemic attack (TIA), and cerebral infarction without residual deficits: Secondary | ICD-10-CM | POA: Diagnosis not present

## 2017-02-11 DIAGNOSIS — I10 Essential (primary) hypertension: Secondary | ICD-10-CM | POA: Diagnosis not present

## 2017-02-11 DIAGNOSIS — D509 Iron deficiency anemia, unspecified: Secondary | ICD-10-CM

## 2017-02-11 DIAGNOSIS — Z85038 Personal history of other malignant neoplasm of large intestine: Secondary | ICD-10-CM

## 2017-02-11 DIAGNOSIS — Z853 Personal history of malignant neoplasm of breast: Secondary | ICD-10-CM | POA: Diagnosis not present

## 2017-02-11 DIAGNOSIS — E790 Hyperuricemia without signs of inflammatory arthritis and tophaceous disease: Secondary | ICD-10-CM | POA: Diagnosis not present

## 2017-02-11 DIAGNOSIS — Z808 Family history of malignant neoplasm of other organs or systems: Secondary | ICD-10-CM | POA: Insufficient documentation

## 2017-02-11 DIAGNOSIS — R809 Proteinuria, unspecified: Secondary | ICD-10-CM | POA: Diagnosis not present

## 2017-02-11 DIAGNOSIS — D3A029 Benign carcinoid tumor of the large intestine, unspecified portion: Secondary | ICD-10-CM

## 2017-02-11 DIAGNOSIS — E119 Type 2 diabetes mellitus without complications: Secondary | ICD-10-CM

## 2017-02-11 DIAGNOSIS — R911 Solitary pulmonary nodule: Secondary | ICD-10-CM

## 2017-02-11 DIAGNOSIS — L237 Allergic contact dermatitis due to plants, except food: Secondary | ICD-10-CM

## 2017-02-11 DIAGNOSIS — D45 Polycythemia vera: Secondary | ICD-10-CM

## 2017-02-11 DIAGNOSIS — Z7982 Long term (current) use of aspirin: Secondary | ICD-10-CM

## 2017-02-11 DIAGNOSIS — Z79899 Other long term (current) drug therapy: Secondary | ICD-10-CM

## 2017-02-11 LAB — CBC WITH DIFFERENTIAL/PLATELET
Basophils Absolute: 0 10*3/uL (ref 0–0.1)
Basophils Relative: 0 %
Eosinophils Absolute: 0.3 10*3/uL (ref 0–0.7)
Eosinophils Relative: 2 %
HCT: 38.1 % (ref 35.0–47.0)
Hemoglobin: 12.2 g/dL (ref 12.0–16.0)
Lymphocytes Relative: 10 %
Lymphs Abs: 1.3 10*3/uL (ref 1.0–3.6)
MCH: 25 pg — ABNORMAL LOW (ref 26.0–34.0)
MCHC: 32.1 g/dL (ref 32.0–36.0)
MCV: 78.1 fL — ABNORMAL LOW (ref 80.0–100.0)
Monocytes Absolute: 0.8 10*3/uL (ref 0.2–0.9)
Monocytes Relative: 6 %
Neutro Abs: 10.7 10*3/uL — ABNORMAL HIGH (ref 1.4–6.5)
Neutrophils Relative %: 82 %
Platelets: 431 10*3/uL (ref 150–440)
RBC: 4.88 MIL/uL (ref 3.80–5.20)
RDW: 16.9 % — ABNORMAL HIGH (ref 11.5–14.5)
WBC: 13.2 10*3/uL — ABNORMAL HIGH (ref 3.6–11.0)

## 2017-02-11 LAB — COMPREHENSIVE METABOLIC PANEL
ALT: 14 U/L (ref 14–54)
AST: 20 U/L (ref 15–41)
Albumin: 4.1 g/dL (ref 3.5–5.0)
Alkaline Phosphatase: 46 U/L (ref 38–126)
Anion gap: 13 (ref 5–15)
BUN: 30 mg/dL — ABNORMAL HIGH (ref 6–20)
CO2: 23 mmol/L (ref 22–32)
Calcium: 9.3 mg/dL (ref 8.9–10.3)
Chloride: 98 mmol/L — ABNORMAL LOW (ref 101–111)
Creatinine, Ser: 1.15 mg/dL — ABNORMAL HIGH (ref 0.44–1.00)
GFR calc Af Amer: 50 mL/min — ABNORMAL LOW (ref >60)
GFR calc non Af Amer: 43 mL/min — ABNORMAL LOW (ref >60)
Glucose, Bld: 238 mg/dL — ABNORMAL HIGH (ref 65–99)
Potassium: 3.8 mmol/L (ref 3.5–5.1)
Sodium: 134 mmol/L — ABNORMAL LOW (ref 135–145)
Total Bilirubin: 0.7 mg/dL (ref 0.3–1.2)
Total Protein: 6.8 g/dL (ref 6.5–8.1)

## 2017-02-11 NOTE — Progress Notes (Signed)
[facility] day:  02/11/2017  Chief Complaint: [name] is a 83 y.o. 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  HPI:  The patient was last seen in the medical oncology clinic on 11/09/2016.  At that time, she noted intermittent diarrhea.  Exam was stable.  Platelet count was 529,000.  She was on hydroxyurea 5 days/week (Mon, Tues, Wed, Fri, Sat).  As her counts had been stable for some time, decision was made to repeat counts in 2 weeks.  Labs on 11/24/2016 included a BMP and a Hgb A1C.  CBC on 12/01/2016 revealed a hematocrit 39.1, hemoglobin 12.1, platelets 436,000, white count 13,700 with an ANC of 10,670.  During the interim, she has done well.  She notes getting poison ivy after pulling some weeds.  She denies any other complaints.   Past Medical History:  Diagnosis Date  . Adenocarcinoma in situ of cervix   . Arthritis    hands  . Breast cancer ([place])   . Colon cancer ([place])   . Diabetes mellitus without complication ([place])   . Endometrial carcinoma ([facility])    s/p total abdominal hysterectomy  . H/O compression fracture of spine 2014   thoracic spine  . H/O polycythemia vera   . H/O TIA (transient ischemic attack) and stroke 09/2014, 03/2015   No deficits  . Hypertension   . Hyperuricemia   . Microalbuminuria   . Polycythemia vera ([place])   . Recurrent falls   . Skin cancer    face, legs  . Stroke [facility]) 2008   no deficits  . Trochanteric bursitis   . Varicose veins    treated    Past Surgical History:  Procedure Laterality Date  . ABDOMINAL HYSTERECTOMY    . BOWEL RESECTION N/A 03/28/2015   Procedure: SMALL BOWEL RESECTION;  Surgeon: Leonie Green, MD;  Location: [facility] ORS;  Service: General;  Laterality: N/A;  . CATARACT EXTRACTION W/ INTRAOCULAR LENS IMPLANT Right   . CATARACT EXTRACTION W/PHACO Left 10/07/2015    Procedure: CATARACT EXTRACTION PHACO AND INTRAOCULAR LENS PLACEMENT (IOC);  Surgeon: Ronnell Freshwater, MD;  Location: [place];  Service: Ophthalmology;  Laterality: Left;  DIABETIC - oral meds VISION BLUE  . EXPLORATORY LAPAROTOMY     for fibroids  . TONSILLECTOMY      Family History  Problem Relation Age of Onset  . Cancer Brother        AML  . Heart attack Mother   . Breast cancer Mother   . Heart attack Father   . Heart attack Sister   . Diabetes Sister   Her brother died of AML.  Social History:  reports that she has quit smoking. She has never used smokeless tobacco. She reports that she does not drink alcohol or use drugs.  The patient's husband died of AML.  She has no family in [place]. 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 The patient is alone today.  Allergies:  Allergies  Allergen Reactions  . Simvastatin     Other reaction(s): Muscle Pain    Current Medications: Current Outpatient Prescriptions  Medication Sig Dispense Refill  . aspirin EC 81 MG tablet Take 81 mg by mouth daily.    . calcium-vitamin D (OSCAL WITH D) 500-200 MG-UNIT per tablet Take 1 tablet by mouth  daily.     . carvedilol (COREG) 6.25 MG tablet Take 6.25 mg by mouth 2 (two) times daily with a meal.    . chlorthalidone (HYGROTON) 25 MG tablet Take 25 mg by mouth daily.    . clopidogrel (PLAVIX) 75 MG tablet Take 1 tablet (75 mg total) by mouth daily. 30 tablet 0  . fexofenadine (ALLEGRA) 180 MG tablet Take 180 mg by mouth daily as needed. Reported on 10/07/2015    . hydroxyurea (HYDREA) 500 MG capsule TAKE 1 CAPSULE BY MOUTH ON MONDAY WEDNESDAY FRIDAY AND SATURDAY WITH FOOD 45 capsule 1  . loperamide (IMODIUM) 2 MG capsule Take 4 mg by mouth as needed for diarrhea or loose stools.    . metFORMIN (GLUCOPHAGE) 1000 MG tablet Take 1,000 mg by mouth 2 (two) times daily.     . polyethylene glycol (MIRALAX / GLYCOLAX) packet  Take 17 g by mouth daily as needed.    . potassium chloride (K-DUR,KLOR-CON) 10 MEQ tablet Take 10 mEq by mouth daily.    . pravastatin (PRAVACHOL) 20 MG tablet Take 20 mg by mouth at bedtime.     . Probiotic Product (PROBIOTIC DAILY PO) Take 1 tablet by mouth daily.    . ramipril (ALTACE) 10 MG capsule Take 10 mg by mouth 2 (two) times daily.     . citalopram (CELEXA) 10 MG tablet Take 10 mg by mouth daily.    . verapamil (CALAN-SR) 240 MG CR tablet Take 240 mg by mouth daily.     No current facility-administered medications for this visit.     Review of Systems:  GENERAL: Feels "good".  Less stamina.  No fevers or sweats.  Weight up 1 pound. PERFORMANCE STATUS (ECOG): 1 HEENT: No visual changes, runny nose, sore throat, mouth sores or tenderness. Lungs: No shortness of breath or cough. No hemoptysis. Cardiac: No chest pain, palpitations, orthopnea, or PND. GI: No nausea, vomiting, diarrhea, melena or hematochezia. GU: No urgency, frequency, dysuria, or hematuria.  Bladder leakage. Musculoskeletal: No back pain. No joint pain. No muscle tenderness. Extremities: No pain or swelling. Skin: Poison ivy. Neuro: No headache, numbness or weakness, balance or coordination issues. Endocrine: Diabetes.  No thyroid issues, hot flashes or night sweats. Psych: No mood changes, depression or anxiety. Pain: No focal pain. Review of systems: All other systems reviewed and found to be negative.  Physical Exam:  Blood pressure 112/67, pulse 74, temperature 97.9 F (36.6 C), temperature source Tympanic, resp. rate 18, weight 138 lb 1 oz (62.6 kg).  GENERAL: Well developed, well nourished, woman sitting comfortably in the exam room in no acute distress MENTAL STATUS: Alert and oriented to person, place and time. HEAD: Short styled light brown hair. Normocephalic, atraumatic, face symmetric, no Cushingoid features. EYES: Glasses. Blue eyes s/p cataract surgery. Pupils equal  round and reactive to light and accomodation. No conjunctivitis or scleral icterus. ENT: Oropharynx clear without lesion. Tongue normal. Mucous membranes moist.  RESPIRATORY: Clear to auscultation without rales, wheezes or rhonchi. CARDIOVASCULAR: Regular rate and rhythm without murmur, rub or gallop. ABDOMEN: Soft, non-tender, with active bowel sounds, and no hepatosplenomegaly. No masses. SKIN: Poison ivy rash on forearm (right > left). EXTREMITIES: No edema, no skin discoloration or tenderness. No palpable cords. LYMPH NODES: No palpable cervical, supraclavicular, axillary or inguinal adenopathy  NEUROLOGICAL: Unremarkable. PSYCH: Appropriate.    Appointment on 02/11/2017  Component Date Value Ref Range Status  . Sodium 02/11/2017 134* 135 - 145 mmol/L Final  . Potassium 02/11/2017 3.8  3.5 - 5.1 mmol/L Final  . Chloride 02/11/2017 98* 101 - 111 mmol/L Final  . CO2 02/11/2017 23  22 - 32 mmol/L Final  . Glucose, Bld 02/11/2017 238* 65 - 99 mg/dL Final  . BUN 02/11/2017 30* 6 - 20 mg/dL Final  . Creatinine, Ser 02/11/2017 1.15* 0.44 - 1.00 mg/dL Final  . Calcium 02/11/2017 9.3  8.9 - 10.3 mg/dL Final  . Total Protein 02/11/2017 6.8  6.5 - 8.1 g/dL Final  . Albumin 02/11/2017 4.1  3.5 - 5.0 g/dL Final  . AST 02/11/2017 20  15 - 41 U/L Final  . ALT 02/11/2017 14  14 - 54 U/L Final  . Alkaline Phosphatase 02/11/2017 46  38 - 126 U/L Final  . Total Bilirubin 02/11/2017 0.7  0.3 - 1.2 mg/dL Final  . GFR calc non Af Amer 02/11/2017 43* >60 mL/min Final  . GFR calc Af Amer 02/11/2017 50* >60 mL/min Final   Comment: (NOTE) The eGFR has been calculated using the CKD EPI equation. This calculation has not been validated in all clinical situations. eGFR's persistently <60 mL/min signify possible Chronic Kidney Disease.   . Anion gap 02/11/2017 13  5 - 15 Final  . WBC 02/11/2017 13.2* 3.6 - 11.0 K/uL Final  . RBC 02/11/2017 4.88  3.80 - 5.20 MIL/uL Final  . Hemoglobin  02/11/2017 12.2  12.0 - 16.0 g/dL Final  . HCT 02/11/2017 38.1  35.0 - 47.0 % Final  . MCV 02/11/2017 78.1* 80.0 - 100.0 fL Final  . MCH 02/11/2017 25.0* 26.0 - 34.0 pg Final  . MCHC 02/11/2017 32.1  32.0 - 36.0 g/dL Final  . RDW 02/11/2017 16.9* 11.5 - 14.5 % Final  . Platelets 02/11/2017 431  150 - 440 K/uL Final  . Neutrophils Relative % 02/11/2017 82  % Final  . Neutro Abs 02/11/2017 10.7* 1.4 - 6.5 K/uL Final  . Lymphocytes Relative 02/11/2017 10  % Final  . Lymphs Abs 02/11/2017 1.3  1.0 - 3.6 K/uL Final  . Monocytes Relative 02/11/2017 6  % Final  . Monocytes Absolute 02/11/2017 0.8  0.2 - 0.9 K/uL Final  . Eosinophils Relative 02/11/2017 2  % Final  . Eosinophils Absolute 02/11/2017 0.3  0 - 0.7 K/uL Final  . Basophils Relative 02/11/2017 0  % Final  . Basophils Absolute 02/11/2017 0.0  0 - 0.1 K/uL Final     Assessment:  [name] is a 83 y.o. female with JAK2+ polycythemia rubra vera (PV) diagnosed in 2008. She has undergone low phlebotomies x 2-3 to maintain a target hematocrit is 40-42.5.   She is on hydroxyurea 500 mg (1 pill) on Mondays, Wednesdays, Fridays and Saturdays (5 pills/week). Screening for von Willebrand and a platelet function was normal. She is on a baby aspirin. She has been on Plavix since her TIA/CVA.   She has a history of iron deficiency anemia status post GI evaluation. Colonoscopy in 07/2007 was negative. For persistent recurrent iron deficiency anemia, she underwent EGD and colonoscopy in 12/2011 and 09/11/2013.  She has a history of carcinoid tumor after presenting with a partial small bowel obstruction.  Abdominal and pelvic CT scan on 03/22/2015 revealed a 3 x 4.9 cm apple core lesion involving a loop of small bowel in the right mid abdomen. There was associated 4 x 1.8 cm adjacent mesenteric implant. Ileocolectomy on 03/28/2015 revealed a grade I neuroendocrine tumor (carcinoid) involving the terminal ileum and right colon.  Largest tumor was 2.5  cm. There were  multiple adjacent nodules of a neuroendocrine tumor. Metastasis was in 9 of 12 lymph nodes.  Pathologic stage was T3N1.  Chromogranin was 4 on 05/07/2015, 3 on 04/13/2016, and 4 on 11/09/2016.   24 hour urine for 5HIAA was 5.2 mg/24 hours (0-14.9).  Octreotide scan on 05/31/2015 revealed no evidence of metastatic disease.  There was an indeterminate 9 mm nodule in the left apex.    Chest CT on 06/11/2015 revealed an 8 mm nodular density (spiculated on axial images) in the left apex. Chest CT on 10/10/2015 revealed a stable 8 mm pulmonary nodule in the anterior left lung apex.  Chest, abdomen and pelvic CT scan on 04/20/2016 revealed no definite evidence of recurrent or metastatic carcinoid tumor. There are no acute findings. There was a stable 8 mm pulmonary nodule in the left apex recommendations (follow-up CT in 12 months). 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 Recommendation was continued attention on follow-up or follow-up with abdominal MRI with and without contrast in 2 years.  Symptomatically, she is doing well.  Exam reveals a poison ivy rash.  Platelet count is 531,000.  Plan: 1.  Labs today:  CBC with diff, CMP.  2.  Discuss persistent platelet count > 400,000 over the last several checks.  Discuss slight adjustment in hydroxyurea.  Patient will add 1 pill every other week (5 pills 1 week and 6 pills the next week). 3.  Anticipate chest and abdomen CT in 03/2017 to follow-up on pulmonary nodule and cystic pancreatic lesion. 4.  RTC in 6 weeks for labs (CBC with diff). 5.  RTC in 3 months for MD assessment and labs (CBC with diff, CMP).   Lequita Asal, MD  02/11/2017

## 2017-02-11 NOTE — Progress Notes (Signed)
Patient states she does not have as much stamina as she used to.  Also states she has lost a lot for strength in her upper legs.  States she has poison ivy on her arms.

## 2017-03-23 ENCOUNTER — Encounter: Payer: Self-pay | Admitting: Emergency Medicine

## 2017-03-23 ENCOUNTER — Emergency Department: Payer: Medicare Other

## 2017-03-23 ENCOUNTER — Emergency Department
Admission: EM | Admit: 2017-03-23 | Discharge: 2017-03-23 | Disposition: A | Discharge status: Home / Self Care | Payer: Medicare Other | Attending: Emergency Medicine | Admitting: Emergency Medicine

## 2017-03-23 DIAGNOSIS — Z85828 Personal history of other malignant neoplasm of skin: Secondary | ICD-10-CM | POA: Insufficient documentation

## 2017-03-23 DIAGNOSIS — Z79899 Other long term (current) drug therapy: Secondary | ICD-10-CM | POA: Diagnosis not present

## 2017-03-23 DIAGNOSIS — Z7984 Long term (current) use of oral hypoglycemic drugs: Secondary | ICD-10-CM | POA: Insufficient documentation

## 2017-03-23 DIAGNOSIS — Z87891 Personal history of nicotine dependence: Secondary | ICD-10-CM | POA: Diagnosis not present

## 2017-03-23 DIAGNOSIS — Z7982 Long term (current) use of aspirin: Secondary | ICD-10-CM | POA: Diagnosis not present

## 2017-03-23 DIAGNOSIS — Z8542 Personal history of malignant neoplasm of other parts of uterus: Secondary | ICD-10-CM | POA: Diagnosis not present

## 2017-03-23 DIAGNOSIS — E119 Type 2 diabetes mellitus without complications: Secondary | ICD-10-CM | POA: Insufficient documentation

## 2017-03-23 DIAGNOSIS — Z85038 Personal history of other malignant neoplasm of large intestine: Secondary | ICD-10-CM | POA: Diagnosis not present

## 2017-03-23 DIAGNOSIS — Z8673 Personal history of transient ischemic attack (TIA), and cerebral infarction without residual deficits: Secondary | ICD-10-CM | POA: Insufficient documentation

## 2017-03-23 DIAGNOSIS — Z853 Personal history of malignant neoplasm of breast: Secondary | ICD-10-CM | POA: Insufficient documentation

## 2017-03-23 DIAGNOSIS — R531 Weakness: Secondary | ICD-10-CM | POA: Diagnosis not present

## 2017-03-23 LAB — URINALYSIS, ROUTINE W REFLEX MICROSCOPIC
Bacteria, UA: NONE SEEN
Bilirubin Urine: NEGATIVE
Glucose, UA: 50 mg/dL — AB
Hgb urine dipstick: NEGATIVE
Ketones, ur: 5 mg/dL — AB
Leukocytes, UA: NEGATIVE
Nitrite: NEGATIVE
Protein, ur: 30 mg/dL — AB
Specific Gravity, Urine: 1.006 (ref 1.005–1.030)
pH: 7 (ref 5.0–8.0)

## 2017-03-23 LAB — COMPREHENSIVE METABOLIC PANEL
ALT: 15 U/L (ref 14–54)
AST: 19 U/L (ref 15–41)
Albumin: 4.1 g/dL (ref 3.5–5.0)
Alkaline Phosphatase: 47 U/L (ref 38–126)
Anion gap: 10 (ref 5–15)
BUN: 26 mg/dL — ABNORMAL HIGH (ref 6–20)
CO2: 27 mmol/L (ref 22–32)
Calcium: 9.5 mg/dL (ref 8.9–10.3)
Chloride: 99 mmol/L — ABNORMAL LOW (ref 101–111)
Creatinine, Ser: 0.98 mg/dL (ref 0.44–1.00)
GFR calc Af Amer: >60 mL/min (ref >60)
GFR calc non Af Amer: 52 mL/min — ABNORMAL LOW (ref >60)
Glucose, Bld: 201 mg/dL — ABNORMAL HIGH (ref 65–99)
Potassium: 3.7 mmol/L (ref 3.5–5.1)
Sodium: 136 mmol/L (ref 135–145)
Total Bilirubin: 0.7 mg/dL (ref 0.3–1.2)
Total Protein: 7.2 g/dL (ref 6.5–8.1)

## 2017-03-23 LAB — CBC
HCT: 40.5 % (ref 35.0–47.0)
Hemoglobin: 12.9 g/dL (ref 12.0–16.0)
MCH: 24.9 pg — ABNORMAL LOW (ref 26.0–34.0)
MCHC: 31.8 g/dL — ABNORMAL LOW (ref 32.0–36.0)
MCV: 78.4 fL — ABNORMAL LOW (ref 80.0–100.0)
Platelets: 337 10*3/uL (ref 150–440)
RBC: 5.17 MIL/uL (ref 3.80–5.20)
RDW: 16.6 % — ABNORMAL HIGH (ref 11.5–14.5)
WBC: 14.4 10*3/uL — ABNORMAL HIGH (ref 3.6–11.0)

## 2017-03-23 LAB — TROPONIN I
Troponin I: <0.03 ng/mL (ref <0.03)
Troponin I: <0.03 ng/mL (ref <0.03)

## 2017-03-23 LAB — LIPASE, BLOOD: Lipase: 27 U/L (ref 11–51)

## 2017-03-23 MED ORDER — LORAZEPAM 2 MG/ML IJ SOLN
0.5000 mg | Freq: Once | INTRAMUSCULAR | Status: AC
  Administered 2017-03-23: 0.5 mg via INTRAVENOUS
  Filled 2017-03-23: qty 1

## 2017-03-23 MED ORDER — ONDANSETRON HCL 4 MG/2ML IJ SOLN
4.0000 mg | Freq: Once | INTRAMUSCULAR | Status: AC
  Administered 2017-03-23: 4 mg via INTRAVENOUS
  Filled 2017-03-23: qty 2

## 2017-03-23 MED ORDER — SODIUM CHLORIDE 0.9 % IV BOLUS (SEPSIS)
1000.0000 mL | Freq: Once | INTRAVENOUS | Status: AC
  Administered 2017-03-23: 1000 mL via INTRAVENOUS

## 2017-03-23 MED ORDER — LISINOPRIL 10 MG PO TABS
10.0000 mg | ORAL_TABLET | Freq: Once | ORAL | Status: AC
  Administered 2017-03-23: 10 mg via ORAL
  Filled 2017-03-23: qty 1

## 2017-03-23 MED ORDER — CARVEDILOL 6.25 MG PO TABS: 6.2500 mg | ORAL_TABLET | Freq: Two times a day (BID) | ORAL | Status: DC

## 2017-03-23 NOTE — ED Notes (Signed)
Pt. Verbalizes understanding of d/c instructions and follow-up. VS stable and pain controlled per pt.  Pt. In NAD at time of d/c and denies further concerns regarding this visit. Pt. Stable at the time of departure from the unit, departing unit by the safest and most appropriate manner per that pt condition and limitations. Pt advised to return to the ED at any time for emergent concerns, or for new/worsening symptoms.   

## 2017-03-23 NOTE — ED Triage Notes (Signed)
Pt ems from home with weakness, emesis, diarrhea that started approx 1500. Pt a/o, hypertensive.

## 2017-03-23 NOTE — ED Provider Notes (Signed)
[facility] Emergency Department Provider Note  Time seen: 5:36 PM  I have reviewed the triage vital signs and the nursing notes.   HISTORY  Chief Complaint No chief complaint on file.    HPI [name] is a 83 y.o. female With a past medical history of arthritis, diabetes, hypertension, TIAs, who presents to the emergency department with sudden onset of weakness nausea, diaphoresis diarrhea around 3 PM. According to the patient she had been feeling well today until around 3 PM when she became acutely weak feeling. States she was nauseated and had an episode of vomiting. Had to go to the bathroom and had an episode of diarrhea. States she continues to feel weak and tremulous so she came to the emergency department for evaluation. Denies any chest pain or shortness of breath at any point. Denies any abdominal pain at any point. Patient denies any current nausea. States she is just feeling fatigued as her only complaint at this time. Denies any headache, slurred speech confusion weakness or numbness.  Past Medical History:  Diagnosis Date  . Adenocarcinoma in situ of cervix   . Arthritis    hands  . Breast cancer ([place])   . Colon cancer ([place])   . Diabetes mellitus without complication ([place])   . Endometrial carcinoma (HCC)    s/p total abdominal hysterectomy  . H/O compression fracture of spine 2014   thoracic spine  . H/O polycythemia vera   . H/O TIA (transient ischemic attack) and stroke 09/2014, 03/2015   No deficits  . Hypertension   . Hyperuricemia   . Microalbuminuria   . Polycythemia vera ([place])   . Recurrent falls   . Skin cancer    face, legs  . Stroke [facility]) 2008   no deficits  . Trochanteric bursitis   . Varicose veins    treated    Patient Active Problem List   Diagnosis Date Noted  . 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 Pulmonary nodule, left 06/04/2015  . 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  Cerebral thrombosis with cerebral infarction 04/03/2015  . Cancer of right colon ([place]) 03/28/2015  . CVA (cerebral infarction) 12/21/2014  . Polycythemia vera ([place]) 06/22/2006    Past Surgical History:  Procedure Laterality Date  . ABDOMINAL HYSTERECTOMY    . BOWEL RESECTION N/A 03/28/2015   Procedure: SMALL BOWEL RESECTION;  Surgeon: Leonie Green, MD;  Location: [facility] ORS;  Service: General;  Laterality: N/A;  . CATARACT EXTRACTION W/ INTRAOCULAR LENS IMPLANT Right   . CATARACT EXTRACTION W/PHACO Left 10/07/2015   Procedure: CATARACT EXTRACTION PHACO AND INTRAOCULAR LENS PLACEMENT (IOC);  Surgeon: Ronnell Freshwater, MD;  Location: [facility];  Service: Ophthalmology;  Laterality: Left;  DIABETIC - oral meds VISION BLUE  . EXPLORATORY LAPAROTOMY     for fibroids  . TONSILLECTOMY      Prior to Admission medications   Medication Sig Start Date End Date Taking? Authorizing Provider  aspirin EC 81 MG tablet Take 81 mg by mouth daily.    [provider]  calcium-vitamin D (OSCAL WITH D) 500-200 MG-UNIT per tablet Take 1 tablet by mouth daily.     [provider]  carvedilol (COREG) 6.25 MG tablet Take 6.25 mg by mouth 2 (two) times daily with a meal.    [provider]  chlorthalidone (HYGROTON) 25 MG tablet Take 25 mg by mouth daily.    [provider]  citalopram (CELEXA)  10 MG tablet Take 10 mg by mouth daily.    [provider]  clopidogrel (PLAVIX) 75 MG tablet Take 1 tablet (75 mg total) by mouth daily. 12/23/14   Fritzi Mandes, MD  fexofenadine (ALLEGRA) 180 MG tablet Take 180 mg by mouth daily as needed. Reported on 10/07/2015    [provider]  hydroxyurea (HYDREA) 500 MG capsule TAKE 1 CAPSULE BY MOUTH ON MONDAY WEDNESDAY FRIDAY AND SATURDAY WITH FOOD 01/09/17   Lequita Asal, MD  loperamide (IMODIUM) 2 MG capsule Take 4 mg by mouth as needed for diarrhea or loose stools.    [provider]   metFORMIN (GLUCOPHAGE) 1000 MG tablet Take 1,000 mg by mouth 2 (two) times daily.     [provider]  polyethylene glycol (MIRALAX / GLYCOLAX) packet Take 17 g by mouth daily as needed.    [provider]  potassium chloride (K-DUR,KLOR-CON) 10 MEQ tablet Take 10 mEq by mouth daily.    [provider]  pravastatin (PRAVACHOL) 20 MG tablet Take 20 mg by mouth at bedtime.     [provider]  Probiotic Product (PROBIOTIC DAILY PO) Take 1 tablet by mouth daily.    [provider]  ramipril (ALTACE) 10 MG capsule Take 10 mg by mouth 2 (two) times daily.     [provider]  verapamil (CALAN-SR) 240 MG CR tablet Take 240 mg by mouth daily.    [provider]    Allergies  Allergen Reactions  . Simvastatin     Other reaction(s): Muscle Pain    Family History  Problem Relation Age of Onset  . Cancer Brother        AML  . Heart attack Mother   . Breast cancer Mother   . Heart attack Father   . Heart attack Sister   . Diabetes Sister     Social History Social History  Substance Use Topics  . Smoking status: Former Research scientist (life sciences)  . Smokeless tobacco: Never Used     Comment: quit 30+ yrs ago  . Alcohol use No    Review of Systems Constitutional: Negative for fever Cardiovascular: Negative for chest pain. Respiratory: Negative for shortness of breath. Gastrointestinal: Negative for abdominal pain. Positive for vomiting and positive for diarrhea Neurological: Negative for headaches, focal weakness or numbness. All other ROS negative  ____________________________________________   PHYSICAL EXAM:  Constitutional: Alert and oriented. Well appearing and in no distress. Eyes: Normal exam ENT   Head: Normocephalic and atraumatic.Marland Kitchen   Mouth/Throat: Mucous membranes are moist. Cardiovascular: Normal rate, regular rhythm.  Respiratory: Normal respiratory effort without tachypnea nor retractions. Breath sounds are clear   Gastrointestinal: Soft and nontender. No distention.   Musculoskeletal: Nontender with normal range of motion in all extremities.  Neurologic:  Normal speech and language. No gross focal neurologic deficits  Skin:  Skin is warm, dry and intact.  Psychiatric: Mood and affect are normal.   ____________________________________________    EKG  EKG reviewed and interpreted by myself shows sinus arrhythmia at 64 bpm, narrow QRS, left axis deviation, large and normal intervals with nonspecific ST changes. No ST elevation.  ____________________________________________    RADIOLOGY  x-ray shows no acute abnormality  ____________________________________________   INITIAL IMPRESSION / ASSESSMENT AND PLAN / ED COURSE  Pertinent labs & imaging results that were available during my care of the patient were reviewed by me and considered in my medical decision making (see chart for details).  patient presents to the emergency  department acute onset of weakness, diaphoresis nausea and one episode of vomiting one episode of diarrhea. Patient states she is feeling better but continues to feel weak and tremulous. Differential this time would include vagal episode, gastroenteritis, less likely but possibly ACS, TIA, intrathoracic or intra-abdominal pathology. We will check labs, IV hydrate, treat with Zofran as a precaution and continue to monitor closely on telemetry in the emergency department while awaiting results. Patient agreeable to plan.  reviewed the patient's records. She was released a started on Actos by her primary care doctor although EMS states a blood sugar 180 upon arrival.  patient's labs have resulted in largely within normal limits. slight leukocytosis.Troponin negative. EKG is reassuring, x-ray negative. Patient had persistent hypertension, continues have tremor we will dose half a milligram of Ativan to help the patient anxiety.we will continue close monitoring in the emergency  department. The patient is feeling better/well we will repeat a troponin. Urinalysis pending.  urinalysis has resulted normal. Patient states she is feeling well. I discussed options, the patient would like to go home if able. We will repeat a troponin as long as the patient continues to feel well with a normal repeat troponin I believe the patient would be safe for discharge home. Patient's current blood pressure still elevated around 606 systolic. We will dose her normal evening medications.  Patient's repeat troponin remains normal. Blood pressure down to 770 systolic. Patient continues to feel well. We'll discharge home into the care of her family.  ____________________________________________   FINAL CLINICAL IMPRESSION(S) / ED DIAGNOSES  weakness    Harvest Dark, MD 03/23/17 2101

## 2017-03-24 ENCOUNTER — Telehealth: Payer: Self-pay | Admitting: *Deleted

## 2017-03-24 NOTE — Telephone Encounter (Signed)
Patient informed and appointment 10/4 cancelled. She stated she will see [place] next month

## 2017-03-24 NOTE — Telephone Encounter (Signed)
-----   Message from Elouise Munroe sent at 03/24/2017  9:09 AM EDT ----- Regarding: labs Contact: [phone] PT had labs at hospital last night she was asking if she needed to come tomorrow at 2 for labs again???  If so I need to call her to R/S them until Friday.  PT didn't get home until midnight

## 2017-03-24 NOTE — Telephone Encounter (Signed)
No. The labs from the ED will be fine. Hematocrit 40.5 and platelets 337,000. Keep all other appointments/labs as scheduled.

## 2017-03-25 ENCOUNTER — Inpatient Hospital Stay: Payer: Medicare Other

## 2017-05-17 ENCOUNTER — Other Ambulatory Visit: Payer: Medicare Other

## 2017-05-17 ENCOUNTER — Ambulatory Visit: Payer: Medicare Other | Admitting: Hematology and Oncology

## 2017-05-24 ENCOUNTER — Inpatient Hospital Stay: Payer: Medicare Other | Attending: Hematology and Oncology

## 2017-05-24 ENCOUNTER — Ambulatory Visit
Admission: RE | Admit: 2017-05-24 | Discharge: 2017-05-24 | Disposition: A | Discharge status: Home / Self Care | Payer: Medicare Other | Source: Ambulatory Visit | Attending: Urgent Care | Admitting: Urgent Care

## 2017-05-24 ENCOUNTER — Inpatient Hospital Stay (HOSPITAL_BASED_OUTPATIENT_CLINIC_OR_DEPARTMENT_OTHER): Payer: Medicare Other | Admitting: Hematology and Oncology

## 2017-05-24 VITALS — BP 156/72 | HR 72 | Temp 97.6°F | Resp 18 | Wt 130.0 lb

## 2017-05-24 DIAGNOSIS — Z8781 Personal history of (healed) traumatic fracture: Secondary | ICD-10-CM | POA: Diagnosis not present

## 2017-05-24 DIAGNOSIS — N289 Disorder of kidney and ureter, unspecified: Secondary | ICD-10-CM | POA: Insufficient documentation

## 2017-05-24 DIAGNOSIS — Z8673 Personal history of transient ischemic attack (TIA), and cerebral infarction without residual deficits: Secondary | ICD-10-CM | POA: Insufficient documentation

## 2017-05-24 DIAGNOSIS — R600 Localized edema: Secondary | ICD-10-CM | POA: Insufficient documentation

## 2017-05-24 DIAGNOSIS — I10 Essential (primary) hypertension: Secondary | ICD-10-CM | POA: Diagnosis not present

## 2017-05-24 DIAGNOSIS — Z803 Family history of malignant neoplasm of breast: Secondary | ICD-10-CM | POA: Insufficient documentation

## 2017-05-24 DIAGNOSIS — E119 Type 2 diabetes mellitus without complications: Secondary | ICD-10-CM | POA: Diagnosis not present

## 2017-05-24 DIAGNOSIS — Z79899 Other long term (current) drug therapy: Secondary | ICD-10-CM

## 2017-05-24 DIAGNOSIS — Z8541 Personal history of malignant neoplasm of cervix uteri: Secondary | ICD-10-CM

## 2017-05-24 DIAGNOSIS — M7989 Other specified soft tissue disorders: Secondary | ICD-10-CM | POA: Insufficient documentation

## 2017-05-24 DIAGNOSIS — Z800 Family history of malignant neoplasm of digestive organs: Secondary | ICD-10-CM | POA: Insufficient documentation

## 2017-05-24 DIAGNOSIS — R911 Solitary pulmonary nodule: Secondary | ICD-10-CM

## 2017-05-24 DIAGNOSIS — Z7984 Long term (current) use of oral hypoglycemic drugs: Secondary | ICD-10-CM | POA: Insufficient documentation

## 2017-05-24 DIAGNOSIS — M7122 Synovial cyst of popliteal space [Baker], left knee: Secondary | ICD-10-CM | POA: Insufficient documentation

## 2017-05-24 DIAGNOSIS — Z7982 Long term (current) use of aspirin: Secondary | ICD-10-CM | POA: Diagnosis not present

## 2017-05-24 DIAGNOSIS — Z87891 Personal history of nicotine dependence: Secondary | ICD-10-CM | POA: Insufficient documentation

## 2017-05-24 DIAGNOSIS — E790 Hyperuricemia without signs of inflammatory arthritis and tophaceous disease: Secondary | ICD-10-CM | POA: Diagnosis not present

## 2017-05-24 DIAGNOSIS — Z85828 Personal history of other malignant neoplasm of skin: Secondary | ICD-10-CM

## 2017-05-24 DIAGNOSIS — M129 Arthropathy, unspecified: Secondary | ICD-10-CM

## 2017-05-24 DIAGNOSIS — D3A029 Benign carcinoid tumor of the large intestine, unspecified portion: Secondary | ICD-10-CM

## 2017-05-24 DIAGNOSIS — D45 Polycythemia vera: Secondary | ICD-10-CM | POA: Insufficient documentation

## 2017-05-24 DIAGNOSIS — K869 Disease of pancreas, unspecified: Secondary | ICD-10-CM

## 2017-05-24 LAB — CBC WITH DIFFERENTIAL/PLATELET
Basophils Absolute: 0.1 10*3/uL (ref 0–0.1)
Basophils Relative: 1 %
Eosinophils Absolute: 0.2 10*3/uL (ref 0–0.7)
Eosinophils Relative: 2 %
HCT: 38.3 % (ref 35.0–47.0)
Hemoglobin: 12 g/dL (ref 12.0–16.0)
Lymphocytes Relative: 9 %
Lymphs Abs: 1 10*3/uL (ref 1.0–3.6)
MCH: 25.5 pg — ABNORMAL LOW (ref 26.0–34.0)
MCHC: 31.2 g/dL — ABNORMAL LOW (ref 32.0–36.0)
MCV: 81.7 fL (ref 80.0–100.0)
Monocytes Absolute: 0.6 10*3/uL (ref 0.2–0.9)
Monocytes Relative: 6 %
Neutro Abs: 9.3 10*3/uL — ABNORMAL HIGH (ref 1.4–6.5)
Neutrophils Relative %: 82 %
Platelets: 367 10*3/uL (ref 150–440)
RBC: 4.68 MIL/uL (ref 3.80–5.20)
RDW: 18.6 % — ABNORMAL HIGH (ref 11.5–14.5)
WBC: 11.2 10*3/uL — ABNORMAL HIGH (ref 3.6–11.0)

## 2017-05-24 LAB — COMPREHENSIVE METABOLIC PANEL
ALT: 14 U/L (ref 14–54)
AST: 23 U/L (ref 15–41)
Albumin: 4.1 g/dL (ref 3.5–5.0)
Alkaline Phosphatase: 46 U/L (ref 38–126)
Anion gap: 13 (ref 5–15)
BUN: 30 mg/dL — ABNORMAL HIGH (ref 6–20)
CO2: 25 mmol/L (ref 22–32)
Calcium: 9.5 mg/dL (ref 8.9–10.3)
Chloride: 100 mmol/L — ABNORMAL LOW (ref 101–111)
Creatinine, Ser: 1.37 mg/dL — ABNORMAL HIGH (ref 0.44–1.00)
GFR calc Af Amer: 40 mL/min — ABNORMAL LOW (ref >60)
GFR calc non Af Amer: 35 mL/min — ABNORMAL LOW (ref >60)
Glucose, Bld: 210 mg/dL — ABNORMAL HIGH (ref 65–99)
Potassium: 3.9 mmol/L (ref 3.5–5.1)
Sodium: 138 mmol/L (ref 135–145)
Total Bilirubin: 0.7 mg/dL (ref 0.3–1.2)
Total Protein: 7.1 g/dL (ref 6.5–8.1)

## 2017-05-24 NOTE — Progress Notes (Signed)
[facility] day:  05/24/2017  Chief Complaint: [name] is a 83 y.o. 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  HPI:  The patient was last seen in the medical oncology clinic on 02/11/2017.  At that time, she was doing well.  She had gotten poison ivy after pulling some weeds.  She denies any other complaints.  Platelet count was 531,000.  She was taking hydroxyurea 5 pills a week (Mondays, Wednesdays, Fridays and Saturdays).  Hydrea was adjusted by adding 1 pill every other week (5 pills 1 week and 6 pills the next week).  CBC on 03/23/2017 revealed a hematocrit of 40.5, hemoglobin 12.9, MCV 78.4, WBC 14,400, and platelets 337,000.  She was scheduled for follow-up chest and abdomen CT scan in 03/2017 to follow-up on pulmonary nodule and cystic pancreatic lesion.  Scan was not performed.  During the interim, patient has been doing "ok". Her lower extremities with multiple areas of unexplained bruising. There is a large area of bruising noted to the lateral aspect of her LEFT knee. She adamantly denies trauma. Patient is on Clopidogrel therapy. Patient used Aleve x 1 dose recently. She has been experiencing some increased swelling in her lower legs. Patient denies chest pain and shortness of breath.   Patient has not experienced any B symptoms or interval infections. Patient is eating well, with her weight remaining relatively stable.   Patient makes mention of having "carbon monoxide poisoning" recently.  Patient had routine HVAC work done within her home that resulted in a gas line being broken. Carbon monoxide alarm went off during the night alerting her to the leak.    Past Medical History:  Diagnosis Date  . Adenocarcinoma in situ of cervix   . Arthritis    hands  . Breast cancer ([place])   . Colon cancer ([place])   . Diabetes mellitus  without complication ([place])   . Endometrial carcinoma (HCC)    s/p total abdominal hysterectomy  . H/O compression fracture of spine 2014   thoracic spine  . H/O polycythemia vera   . H/O TIA (transient ischemic attack) and stroke 09/2014, 03/2015   No deficits  . Hypertension   . Hyperuricemia   . Microalbuminuria   . Polycythemia vera ([place])   . Recurrent falls   . Skin cancer    face, legs  . Stroke [facility]) 2008   no deficits  . Trochanteric bursitis   . Varicose veins    treated    Past Surgical History:  Procedure Laterality Date  . ABDOMINAL HYSTERECTOMY    . BOWEL RESECTION N/A 03/28/2015   Procedure: SMALL BOWEL RESECTION;  Surgeon: Leonie Green, MD;  Location: [facility] ORS;  Service: General;  Laterality: N/A;  . CATARACT EXTRACTION W/ INTRAOCULAR LENS IMPLANT Right   . CATARACT EXTRACTION W/PHACO Left 10/07/2015   Procedure: CATARACT EXTRACTION PHACO AND INTRAOCULAR LENS PLACEMENT (IOC);  Surgeon: Ronnell Freshwater, MD;  Location: [facility];  Service: Ophthalmology;  Laterality: Left;  DIABETIC - oral meds VISION BLUE  . EXPLORATORY LAPAROTOMY     for fibroids  . TONSILLECTOMY      Family History  Problem Relation Age of Onset  . Cancer Brother        AML  . Heart attack Mother   . Breast cancer Mother   . Heart attack Father   .  Heart attack Sister   . Diabetes Sister   Her brother died of AML.  Social History:  reports that she has quit smoking. she has never used smokeless tobacco. She reports that she does not drink alcohol or use drugs.  The patient's husband died of AML.  She has no family in [place]. 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 The patient is alone today.  Allergies:  Allergies  Allergen Reactions  . Simvastatin     Other reaction(s): Muscle Pain    Current Medications: Current Outpatient Medications  Medication Sig Dispense Refill  . alendronate (FOSAMAX) 70 MG  tablet Take 70 mg by mouth once a week. Take with a full glass of water on an empty stomach.    Marland Kitchen aspirin EC 81 MG tablet Take 81 mg by mouth daily.    . calcium-vitamin D (OSCAL WITH D) 500-200 MG-UNIT per tablet Take 1 tablet by mouth daily.     . carvedilol (COREG) 6.25 MG tablet Take 6.25 mg by mouth 2 (two) times daily with a meal.    . chlorthalidone (HYGROTON) 25 MG tablet Take 25 mg by mouth daily.    . clopidogrel (PLAVIX) 75 MG tablet Take 1 tablet (75 mg total) by mouth daily. 30 tablet 0  . docusate sodium (COLACE) 250 MG capsule Take 250 mg by mouth daily as needed for constipation.    . fexofenadine (ALLEGRA) 180 MG tablet Take 180 mg by mouth daily as needed. Reported on 10/07/2015    . hydroxyurea (HYDREA) 500 MG capsule TAKE 1 CAPSULE BY MOUTH ON MONDAY WEDNESDAY FRIDAY AND SATURDAY WITH FOOD 45 capsule 1  . loperamide (IMODIUM) 2 MG capsule Take 4 mg by mouth as needed for diarrhea or loose stools.    . metFORMIN (GLUCOPHAGE) 1000 MG tablet Take 1,000 mg by mouth 2 (two) times daily.     . pioglitazone (ACTOS) 15 MG tablet Take 15 mg by mouth daily.    . polyethylene glycol (MIRALAX / GLYCOLAX) packet Take 17 g by mouth daily as needed.    . potassium chloride (K-DUR,KLOR-CON) 10 MEQ tablet Take 10 mEq by mouth daily.    . pravastatin (PRAVACHOL) 20 MG tablet Take 20 mg by mouth at bedtime.     . Probiotic Product (PROBIOTIC DAILY PO) Take 1 tablet by mouth daily.    . ramipril (ALTACE) 10 MG capsule Take 10 mg by mouth 2 (two) times daily.     . verapamil (CALAN-SR) 240 MG CR tablet Take 240 mg by mouth daily.     No current facility-administered medications for this visit.     Review of Systems:  GENERAL: Feels "ok".  Less stamina.  No fevers or sweats.  Weight down 8 pounds. PERFORMANCE STATUS (ECOG): 1 HEENT: No visual changes, runny nose, sore throat, mouth sores or tenderness. Lungs: No shortness of breath or cough. No hemoptysis. Cardiac: No chest pain,  palpitations, orthopnea, or PND. GI: No nausea, vomiting, diarrhea, melena or hematochezia. GU: No urgency, frequency, dysuria, or hematuria.  Bladder leakage. Musculoskeletal: No back pain. No joint pain. No muscle tenderness. Extremities: No pain or swelling. Skin: Bruising.  No ulcers or lesions. Neuro: No headache, numbness or weakness, balance or coordination issues. Endocrine: Diabetes.  No thyroid issues, hot flashes or night sweats. Psych: No mood changes, depression or anxiety. Pain: No focal pain. Review of systems: All other systems reviewed and found to be negative.  Physical Exam:  Blood pressure (!) 156/72, pulse 72, temperature 97.6 F (36.4 C), temperature source Tympanic, resp. rate 18, weight 130 lb (59 kg), SpO2 98 %.  GENERAL: Well developed, well nourished, woman sitting comfortably in the exam room in no acute distress MENTAL STATUS: Alert and oriented to person, place and time. HEAD: Short styled dark blonde hair. Normocephalic, atraumatic, face symmetric, no Cushingoid features. EYES: Glasses. Blue eyes s/p cataract surgery. Pupils equal round and reactive to light and accomodation. No conjunctivitis or scleral icterus. ENT: Oropharynx clear without lesion. Tongue normal. Mucous membranes moist.  RESPIRATORY: Clear to auscultation without rales, wheezes or rhonchi. CARDIOVASCULAR: Regular rate and rhythm without murmur, rub or gallop. ABDOMEN: Soft, non-tender, with active bowel sounds, and no hepatosplenomegaly. No masses. SKIN: Bruising noted to bilateral lower extremities. No rashes or area of compromised integrity.  EXTREMITIES:  Left lower extremity edema.  No skin discoloration or tenderness. No palpable cords. LYMPH NODES: No palpable cervical, supraclavicular, axillary or inguinal adenopathy  NEUROLOGICAL: Unremarkable. PSYCH: Appropriate.    Appointment on 05/24/2017  Component Date Value Ref Range Status  . WBC 05/24/2017  11.2* 3.6 - 11.0 K/uL Final  . RBC 05/24/2017 4.68  3.80 - 5.20 MIL/uL Final  . Hemoglobin 05/24/2017 12.0  12.0 - 16.0 g/dL Final  . HCT 05/24/2017 38.3  35.0 - 47.0 % Final  . MCV 05/24/2017 81.7  80.0 - 100.0 fL Final  . MCH 05/24/2017 25.5* 26.0 - 34.0 pg Final  . MCHC 05/24/2017 31.2* 32.0 - 36.0 g/dL Final  . RDW 05/24/2017 18.6* 11.5 - 14.5 % Final  . Platelets 05/24/2017 367  150 - 440 K/uL Final  . Neutrophils Relative % 05/24/2017 82  % Final  . Neutro Abs 05/24/2017 9.3* 1.4 - 6.5 K/uL Final  . Lymphocytes Relative 05/24/2017 9  % Final  . Lymphs Abs 05/24/2017 1.0  1.0 - 3.6 K/uL Final  . Monocytes Relative 05/24/2017 6  % Final  . Monocytes Absolute 05/24/2017 0.6  0.2 - 0.9 K/uL Final  . Eosinophils Relative 05/24/2017 2  % Final  . Eosinophils Absolute 05/24/2017 0.2  0 - 0.7 K/uL Final  . Basophils Relative 05/24/2017 1  % Final  . Basophils Absolute 05/24/2017 0.1  0 - 0.1 K/uL Final  . Sodium 05/24/2017 138  135 - 145 mmol/L Final  . Potassium 05/24/2017 3.9  3.5 - 5.1 mmol/L Final  . Chloride 05/24/2017 100* 101 - 111 mmol/L Final  . CO2 05/24/2017 25  22 - 32 mmol/L Final  . Glucose, Bld 05/24/2017 210* 65 - 99 mg/dL Final  . BUN 05/24/2017 30* 6 - 20 mg/dL Final  . Creatinine, Ser 05/24/2017 1.37* 0.44 - 1.00 mg/dL Final  . Calcium 05/24/2017 9.5  8.9 - 10.3 mg/dL Final  . Total Protein 05/24/2017 7.1  6.5 - 8.1 g/dL Final  . Albumin 05/24/2017 4.1  3.5 - 5.0 g/dL Final  . AST 05/24/2017 23  15 - 41 U/L Final  . ALT 05/24/2017 14  14 - 54 U/L Final  . Alkaline Phosphatase 05/24/2017 46  38 - 126 U/L Final  . Total Bilirubin 05/24/2017 0.7  0.3 - 1.2 mg/dL Final  . GFR calc non Af Amer 05/24/2017 35* >60 mL/min Final  . GFR calc Af Amer 05/24/2017 40* >60 mL/min Final   Comment: (NOTE) The eGFR has been calculated using the CKD EPI equation. This calculation has not been validated in all clinical situations. eGFR's persistently <60 mL/min signify possible  Chronic Kidney Disease.   Marland Kitchen  Anion gap 05/24/2017 13  5 - 15 Final     Assessment:  [name] is a 83 y.o. female with JAK2+ polycythemia rubra vera (PV) diagnosed in 2008. She has undergone low phlebotomies x 2-3 to maintain a target hematocrit is 40-42.5.   She is on hydroxyurea 500 mg (1 pill) on Mondays, Wednesdays, Fridays and Saturdays (5 pills/week). Screening for von Willebrand and a platelet function was normal. She is on a baby aspirin. She has been on Plavix since her TIA/CVA.   She has a history of iron deficiency anemia status post GI evaluation. Colonoscopy in 07/2007 was negative. For persistent recurrent iron deficiency anemia, she underwent EGD and colonoscopy in 12/2011 and 09/11/2013.  She has a history of carcinoid tumor after presenting with a partial small bowel obstruction.  Abdominal and pelvic CT scan on 03/22/2015 revealed a 3 x 4.9 cm apple core lesion involving a loop of small bowel in the right mid abdomen. There was associated 4 x 1.8 cm adjacent mesenteric implant. Ileocolectomy on 03/28/2015 revealed a grade I neuroendocrine tumor (carcinoid) involving the terminal ileum and right colon.  Largest tumor was 2.5 cm. There were multiple adjacent nodules of a neuroendocrine tumor. Metastasis was in 9 of 12 lymph nodes.  Pathologic stage was T3N1.  Chromogranin was 4 on 05/07/2015, 3 on 04/13/2016, and 4 on 11/09/2016.   24 hour urine for 5HIAA was 5.2 mg/24 hours (0-14.9).  Octreotide scan on 05/31/2015 revealed no evidence of metastatic disease.  There was an indeterminate 9 mm nodule in the left apex.    Chest CT on 06/11/2015 revealed an 8 mm nodular density (spiculated on axial images) in the left apex. Chest CT on 10/10/2015 revealed a stable 8 mm pulmonary nodule in the anterior left lung apex.  Chest, abdomen and pelvic CT scan on 04/20/2016 revealed no definite evidence of recurrent or metastatic carcinoid tumor. There are no acute findings. There was a  stable 8 mm pulmonary nodule in the left apex recommendations (follow-up CT in 12 months). 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 Recommendation was continued attention on follow-up or follow-up with abdominal MRI with and without contrast in 2 years.  Symptomatically, she is doing well.  Exam reveals bruising to bilateral lower extremities.  LEFT lower leg is swollen. Platelet count is 367,000. Creatinine has increased to 1.37 (previously 0.98).  Her CrCl is calculated at 26.9 ml/min.   Plan: 1.  Labs today:  CBC with diff, CMP.  2.  Ultrasound LEFT lower extremity to assess edema.  3.  Continue Hydrea as previously prescribed. 4.  Continue Clopidogrel as previously prescribed.  5.  Discuss renal insufficiency. Creatinine has increase from 0.98 to 1.37. Patient is on Hydrea and Chlorthalidone, both of which could potentially be contributory. Patient to see PCP in 2 weeks. Will communicate with Dr. Edwina Barth regarding decline in renal function. Labs from today sent to PCP.  6.  RTC in 6 weeks for labs (CBC with diff). 7.  RTC in 3 months for MD assessment and labs (CBC with diff, CMP).  Addendum:  Left lower extremity duplex revealed no DVT.  There was a 4.9 cm left sided Baker's cyst.   Honor Loh, NP  05/24/2017, 12:18 PM  I saw and evaluated the patient, participating in the key portions of the service and reviewing pertinent diagnostic studies and records.  I reviewed the nurse practitioner's note and agree with  the findings and the plan.  The assessment and plan were discussed with the patient.  Several questions were asked by the patient and answered.   Nolon Stalls, MD 05/24/2017,12:18 PM

## 2017-05-24 NOTE — Progress Notes (Signed)
Pt in today for follow up for polycythemia.  Pt tearful, worried about swelling in lower legs and bruising that has "all of a sudden come up".  Pt also reports having a natural gas leak at her home and has been sick from "gas poisoning".

## 2017-05-26 ENCOUNTER — Telehealth: Payer: Self-pay | Admitting: *Deleted

## 2017-05-26 NOTE — Telephone Encounter (Signed)
Called patient to inform her that she has a Baker's cyst in her leg, not a DVT.  She should follow up with Dr. Edwina Barth for this.  She states she has an appointment for bloodwork on Monday and appointment with Dr. Wynetta Emery one week later.  She is going to call him tomorrow to see if she can get appointment moved up.

## 2017-05-26 NOTE — Telephone Encounter (Signed)
  Please forward imaging results to PCP for management.  No clot.  Baker's cyst.  [name]

## 2017-05-26 NOTE — Telephone Encounter (Signed)
Patient called asking for results of [place] and states that her legs are no better. Asking for a return call [phone]  CLINICAL DATA:  Left lower extremity edema for the past 2 weeks. History of cervical and stomach cancer. Evaluate for DVT.  EXAM: LEFT LOWER EXTREMITY VENOUS DOPPLER ULTRASOUND  TECHNIQUE: Gray-scale sonography with graded compression, as well as color Doppler and duplex ultrasound were performed to evaluate the lower extremity deep venous systems from the level of the common femoral vein and including the common femoral, femoral, profunda femoral, popliteal and calf veins including the posterior tibial, peroneal and gastrocnemius veins when visible. The superficial great saphenous vein was also interrogated. Spectral Doppler was utilized to evaluate flow at rest and with distal augmentation maneuvers in the common femoral, femoral and popliteal veins.  COMPARISON:  None.  FINDINGS: Contralateral Common Femoral Vein: Respiratory phasicity is normal and symmetric with the symptomatic side. No evidence of thrombus. Normal compressibility.  Common Femoral Vein: No evidence of thrombus. Normal compressibility, respiratory phasicity and response to augmentation.  Saphenofemoral Junction: No evidence of thrombus. Normal compressibility and flow on color Doppler imaging.  Profunda Femoral Vein: No evidence of thrombus. Normal compressibility and flow on color Doppler imaging.  Femoral Vein: No evidence of thrombus. Normal compressibility, respiratory phasicity and response to augmentation.  Popliteal Vein: No evidence of thrombus. Normal compressibility, respiratory phasicity and response to augmentation.  Calf Veins: No evidence of thrombus. Normal compressibility and flow on color Doppler imaging.  Superficial Great Saphenous Vein: No evidence of thrombus. Normal compressibility.  Venous Reflux:  None.  Other Findings: Note is made of an  approximately 4.9 x 3.0 x 1.1 cm anechoic fluid collection with the left popliteal fossa which is favored to represent a Baker cyst.  IMPRESSION: 1. No evidence of DVT within the left lower extremity. 2. Instilling noted approximately 4.9 cm left-sided Baker cyst.   Electronically Signed   By: Eldridge Abrahams.D.

## 2017-06-06 ENCOUNTER — Encounter: Payer: Self-pay | Admitting: Hematology and Oncology

## 2017-07-05 ENCOUNTER — Inpatient Hospital Stay: Payer: Medicare Other | Attending: Hematology and Oncology | Admitting: *Deleted

## 2017-07-05 ENCOUNTER — Other Ambulatory Visit: Payer: Self-pay | Admitting: Urgent Care

## 2017-07-05 DIAGNOSIS — D45 Polycythemia vera: Secondary | ICD-10-CM | POA: Diagnosis not present

## 2017-07-05 LAB — CBC WITH DIFFERENTIAL/PLATELET
Basophils Absolute: 0.1 10*3/uL (ref 0–0.1)
Basophils Relative: 1 %
Eosinophils Absolute: 0.4 10*3/uL (ref 0–0.7)
Eosinophils Relative: 3 %
HCT: 40.3 % (ref 35.0–47.0)
Hemoglobin: 12.5 g/dL (ref 12.0–16.0)
Lymphocytes Relative: 7 %
Lymphs Abs: 0.9 10*3/uL — ABNORMAL LOW (ref 1.0–3.6)
MCH: 25.7 pg — ABNORMAL LOW (ref 26.0–34.0)
MCHC: 31.1 g/dL — ABNORMAL LOW (ref 32.0–36.0)
MCV: 82.8 fL (ref 80.0–100.0)
Monocytes Absolute: 0.8 10*3/uL (ref 0.2–0.9)
Monocytes Relative: 6 %
Neutro Abs: 11 10*3/uL — ABNORMAL HIGH (ref 1.4–6.5)
Neutrophils Relative %: 83 %
Platelets: 421 10*3/uL (ref 150–440)
RBC: 4.87 MIL/uL (ref 3.80–5.20)
RDW: 17.9 % — ABNORMAL HIGH (ref 11.5–14.5)
WBC: 13.2 10*3/uL — ABNORMAL HIGH (ref 3.6–11.0)

## 2017-07-05 MED ORDER — HYDROXYUREA 500 MG PO CAPS: ORAL_CAPSULE | ORAL | 1 refills | Status: DC

## 2017-07-06 ENCOUNTER — Telehealth: Payer: Self-pay | Admitting: Urgent Care

## 2017-07-06 ENCOUNTER — Other Ambulatory Visit: Payer: Self-pay | Admitting: Urgent Care

## 2017-07-06 MED ORDER — HYDROXYUREA 500 MG PO CAPS: ORAL_CAPSULE | ORAL | 1 refills | Status: DC

## 2017-07-06 NOTE — Telephone Encounter (Signed)
Call placed to patient to find out how she is taking her Hydrea. Had to leave message on voice mail

## 2017-07-06 NOTE — Telephone Encounter (Signed)
Patient returned call and states she takes 4 pills one week alternating with 5 pills the next week

## 2017-07-06 NOTE — Telephone Encounter (Signed)
Just need to confirm what dose of Hydroxyurea she is taking. We have one thing, the pharmacy has another, and she is telling pharmacy something different. I need to know how she is taking the 500 mg tabs at this point.

## 2017-08-23 ENCOUNTER — Inpatient Hospital Stay: Payer: Medicare Other | Attending: Hematology and Oncology

## 2017-08-23 ENCOUNTER — Inpatient Hospital Stay (HOSPITAL_BASED_OUTPATIENT_CLINIC_OR_DEPARTMENT_OTHER): Payer: Medicare Other | Admitting: Hematology and Oncology

## 2017-08-23 ENCOUNTER — Encounter: Payer: Self-pay | Admitting: Hematology and Oncology

## 2017-08-23 VITALS — BP 159/81 | HR 62 | Temp 96.0°F | Wt 132.5 lb

## 2017-08-23 DIAGNOSIS — D45 Polycythemia vera: Secondary | ICD-10-CM | POA: Insufficient documentation

## 2017-08-23 DIAGNOSIS — D509 Iron deficiency anemia, unspecified: Secondary | ICD-10-CM

## 2017-08-23 DIAGNOSIS — R609 Edema, unspecified: Secondary | ICD-10-CM | POA: Insufficient documentation

## 2017-08-23 DIAGNOSIS — N289 Disorder of kidney and ureter, unspecified: Secondary | ICD-10-CM | POA: Diagnosis not present

## 2017-08-23 DIAGNOSIS — R911 Solitary pulmonary nodule: Secondary | ICD-10-CM

## 2017-08-23 LAB — CBC WITH DIFFERENTIAL/PLATELET
Basophils Absolute: 0.5 10*3/uL — ABNORMAL HIGH (ref 0–0.1)
Basophils Relative: 4 %
Eosinophils Absolute: 0.4 10*3/uL (ref 0–0.7)
Eosinophils Relative: 3 %
HCT: 39.4 % (ref 35.0–47.0)
Hemoglobin: 12.5 g/dL (ref 12.0–16.0)
Lymphocytes Relative: 8 %
Lymphs Abs: 1.1 10*3/uL (ref 1.0–3.6)
MCH: 26.2 pg (ref 26.0–34.0)
MCHC: 31.7 g/dL — ABNORMAL LOW (ref 32.0–36.0)
MCV: 82.6 fL (ref 80.0–100.0)
Monocytes Absolute: 0.9 10*3/uL (ref 0.2–0.9)
Monocytes Relative: 6 %
Neutro Abs: 11.2 10*3/uL — ABNORMAL HIGH (ref 1.4–6.5)
Neutrophils Relative %: 79 %
Platelets: 469 10*3/uL — ABNORMAL HIGH (ref 150–440)
RBC: 4.76 MIL/uL (ref 3.80–5.20)
RDW: 17.3 % — ABNORMAL HIGH (ref 11.5–14.5)
WBC: 14.1 10*3/uL — ABNORMAL HIGH (ref 3.6–11.0)

## 2017-08-23 LAB — COMPREHENSIVE METABOLIC PANEL
ALT: 13 U/L — ABNORMAL LOW (ref 14–54)
AST: 18 U/L (ref 15–41)
Albumin: 4.1 g/dL (ref 3.5–5.0)
Alkaline Phosphatase: 46 U/L (ref 38–126)
Anion gap: 9 (ref 5–15)
BUN: 30 mg/dL — ABNORMAL HIGH (ref 6–20)
CO2: 26 mmol/L (ref 22–32)
Calcium: 9.3 mg/dL (ref 8.9–10.3)
Chloride: 101 mmol/L (ref 101–111)
Creatinine, Ser: 1.22 mg/dL — ABNORMAL HIGH (ref 0.44–1.00)
GFR calc Af Amer: 46 mL/min — ABNORMAL LOW (ref >60)
GFR calc non Af Amer: 40 mL/min — ABNORMAL LOW (ref >60)
Glucose, Bld: 168 mg/dL — ABNORMAL HIGH (ref 65–99)
Potassium: 4.2 mmol/L (ref 3.5–5.1)
Sodium: 136 mmol/L (ref 135–145)
Total Bilirubin: 0.7 mg/dL (ref 0.3–1.2)
Total Protein: 7 g/dL (ref 6.5–8.1)

## 2017-08-23 NOTE — Progress Notes (Signed)
Patient recently had shingles on her right buttock and down her leg.  States she has knee pain since then.

## 2017-08-23 NOTE — Progress Notes (Signed)
[facility] day:  08/23/2017  Chief Complaint: [name] is a 84 y.o. 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  HPI:  The patient was last seen in the medical oncology clinic on 05/24/2017.  At that time,  she was doing well.  Exam revealed bilateral lower extremity bruising.  LEFT lower leg was swollen. Platelet count was 367,000. Creatinine had increased to 1.37 (CrCl 26.9 ml/min).   Left lower extremity duplex revealed no evidence of DVT.  There was a 4.9 cm left sided Baker's cyst. She has been seen by Dr. Edwina Barth, however patient notes that he elected not to drain it.  CBC on 07/05/2017 revealed a hematocrit of 40.3, hemoglobin 12.5, MCV 82.8, platelets 421,000, WBC 13,200 with an ANC of 11,000.  Symptomatically, patient is "doing alight for an old lady". Patient notes that she developed shingles over Christmas.  Rash developed on RIGHT buttocks and extended down onto her RIGHT lower extremity (? right S1/S2 dermatome). Patient had a recent skin cancer removed from the RIGHT side of her upper lip.   Patient is taking Hydrea 500 mg (5 pills one week, and 4 pills the next).   She is eating well. Her weight is up 2 pounds. Right knee pain is rated 5/10 in clinic today.    Past Medical History:  Diagnosis Date  . Adenocarcinoma in situ of cervix   . Arthritis    hands  . Breast cancer ([place])   . Colon cancer ([place])   . Diabetes mellitus without complication ([place])   . Endometrial carcinoma ([facility])    s/p total abdominal hysterectomy  . H/O compression fracture of spine 2014   thoracic spine  . H/O polycythemia vera   . H/O TIA (transient ischemic attack) and stroke 09/2014, 03/2015   No deficits  . Hypertension   . Hyperuricemia   . Microalbuminuria   . Polycythemia vera ([place])   . Recurrent falls   . Skin cancer    face, legs  .  Stroke [facility]) 2008   no deficits  . Trochanteric bursitis   . Varicose veins    treated    Past Surgical History:  Procedure Laterality Date  . ABDOMINAL HYSTERECTOMY    . BOWEL RESECTION N/A 03/28/2015   Procedure: SMALL BOWEL RESECTION;  Surgeon: Leonie Green, MD;  Location: [facility] ORS;  Service: General;  Laterality: N/A;  . CATARACT EXTRACTION W/ INTRAOCULAR LENS IMPLANT Right   . CATARACT EXTRACTION W/PHACO Left 10/07/2015   Procedure: CATARACT EXTRACTION PHACO AND INTRAOCULAR LENS PLACEMENT (IOC);  Surgeon: Ronnell Freshwater, MD;  Location: [facility];  Service: Ophthalmology;  Laterality: Left;  DIABETIC - oral meds VISION BLUE  . EXPLORATORY LAPAROTOMY     for fibroids  . TONSILLECTOMY      Family History  Problem Relation Age of Onset  . Cancer Brother        AML  . Heart attack Mother   . Breast cancer Mother   . Heart attack Father   . Heart attack Sister   . Diabetes Sister   Her brother died of AML.  Social History:  reports that she has quit smoking. she has never used smokeless tobacco. She reports that she does not drink alcohol or use drugs.  The patient's husband died of AML.  She has no family in [place]. She was  a principal (grades K through 5). She can't play golf secondary to endurance issues. The patient is alone today.  Allergies:  Allergies  Allergen Reactions  . Simvastatin     Other reaction(s): Muscle Pain    Current Medications: Current Outpatient Medications  Medication Sig Dispense Refill  . alendronate (FOSAMAX) 70 MG tablet Take 70 mg by mouth once a week. Take with a full glass of water on an empty stomach.    Marland Kitchen aspirin EC 81 MG tablet Take 81 mg by mouth daily.    . calcium-vitamin D (OSCAL WITH D) 500-200 MG-UNIT per tablet Take 1 tablet by mouth daily.     . carvedilol (COREG) 6.25 MG tablet Take 6.25 mg by mouth 2 (two) times daily with a meal.    . chlorthalidone (HYGROTON) 25 MG tablet Take 25 mg by mouth  daily.    . clopidogrel (PLAVIX) 75 MG tablet Take 1 tablet (75 mg total) by mouth daily. 30 tablet 0  . docusate sodium (COLACE) 250 MG capsule Take 250 mg by mouth daily as needed for constipation.    . fexofenadine (ALLEGRA) 180 MG tablet Take 180 mg by mouth daily as needed. Reported on 10/07/2015    . hydroxyurea (HYDREA) 500 MG capsule TAKE 1 CAPSULE BY MOUTH ON MONDAY WEDNESDAY FRIDAY AND Saturday. Take  1 capsule every other Tuesday WITH FOOD 54 capsule 1  . loperamide (IMODIUM) 2 MG capsule Take 4 mg by mouth as needed for diarrhea or loose stools.    . metFORMIN (GLUCOPHAGE) 1000 MG tablet Take 1,000 mg by mouth 2 (two) times daily.     . pioglitazone (ACTOS) 15 MG tablet Take 15 mg by mouth daily.    . polyethylene glycol (MIRALAX / GLYCOLAX) packet Take 17 g by mouth daily as needed.    . potassium chloride (K-DUR,KLOR-CON) 10 MEQ tablet Take 10 mEq by mouth daily.    . pravastatin (PRAVACHOL) 20 MG tablet Take 20 mg by mouth at bedtime.     . Probiotic Product (PROBIOTIC DAILY PO) Take 1 tablet by mouth daily.    . ramipril (ALTACE) 10 MG capsule Take 10 mg by mouth 2 (two) times daily.     . verapamil (CALAN-SR) 240 MG CR tablet Take 240 mg by mouth daily.     No current facility-administered medications for this visit.     Review of Systems:  GENERAL: Feels "ok".  No fevers or sweats.  Weight up 2 pounds. PERFORMANCE STATUS (ECOG): 1 HEENT: No visual changes, runny nose, sore throat, mouth sores or tenderness. Lungs: No shortness of breath or cough. No hemoptysis. Cardiac: No chest pain, palpitations, orthopnea, or PND. GI: No nausea, vomiting, diarrhea, melena or hematochezia. GU: No urgency, frequency, dysuria, or hematuria.  Bladder leakage. Musculoskeletal: No back pain. Right knee pain. No muscle tenderness. Extremities: Baker's cyst.  No pain or swelling. Skin: Interval shingles.  Bruising.  No ulcers or lesions. Neuro: No headache, numbness or weakness,  balance or coordination issues. Endocrine: Diabetes.  No thyroid issues, hot flashes or night sweats. Psych: No mood changes, depression or anxiety. Pain: 5/10 - right knee. Review of systems: All other systems reviewed and found to be negative.  Physical Exam:  Blood pressure (!) 159/81, pulse 62, temperature (!) 96 F (35.6 C), temperature source Tympanic, weight 132 lb 8 oz (60.1 kg), SpO2 98 %.  GENERAL: Well developed, well nourished, woman sitting comfortably in the exam room in no acute distress MENTAL STATUS: Alert and  oriented to person, place and time. HEAD: Short styled dark blonde hair. Normocephalic, atraumatic, face symmetric, no Cushingoid features. EYES: Glasses. Blue eyes s/p cataract surgery. Pupils equal round and reactive to light and accomodation. No conjunctivitis or scleral icterus. ENT: Right upper lip small bandage.  Oropharynx clear without lesion. Tongue normal. Mucous membranes moist.  RESPIRATORY: Clear to auscultation without rales, wheezes or rhonchi. CARDIOVASCULAR: Regular rate and rhythm without murmur, rub or gallop. ABDOMEN: Soft, non-tender, with active bowel sounds, and no hepatosplenomegaly. No masses. SKIN: Bruising noted to bilateral lower extremities. No rashes or area of compromised integrity. Right S1/S2 dermatome unremarkable. EXTREMITIES:  Left lower extremity edema.  No skin discoloration or tenderness. No palpable cords. LYMPH NODES: No palpable cervical, supraclavicular, axillary or inguinal adenopathy  NEUROLOGICAL: Unremarkable. PSYCH: Appropriate.    Appointment on 08/23/2017  Component Date Value Ref Range Status  . Sodium 08/23/2017 136  135 - 145 mmol/L Final  . Potassium 08/23/2017 4.2  3.5 - 5.1 mmol/L Final  . Chloride 08/23/2017 101  101 - 111 mmol/L Final  . CO2 08/23/2017 26  22 - 32 mmol/L Final  . Glucose, Bld 08/23/2017 168* 65 - 99 mg/dL Final  . BUN 08/23/2017 30* 6 - 20 mg/dL Final  . Creatinine,  Ser 08/23/2017 1.22* 0.44 - 1.00 mg/dL Final  . Calcium 08/23/2017 9.3  8.9 - 10.3 mg/dL Final  . Total Protein 08/23/2017 7.0  6.5 - 8.1 g/dL Final  . Albumin 08/23/2017 4.1  3.5 - 5.0 g/dL Final  . AST 08/23/2017 18  15 - 41 U/L Final  . ALT 08/23/2017 13* 14 - 54 U/L Final  . Alkaline Phosphatase 08/23/2017 46  38 - 126 U/L Final  . Total Bilirubin 08/23/2017 0.7  0.3 - 1.2 mg/dL Final  . GFR calc non Af Amer 08/23/2017 40* >60 mL/min Final  . GFR calc Af Amer 08/23/2017 46* >60 mL/min Final   Comment: (NOTE) The eGFR has been calculated using the CKD EPI equation. This calculation has not been validated in all clinical situations. eGFR's persistently <60 mL/min signify possible Chronic Kidney Disease.   Georgiann Hahn gap 08/23/2017 9  5 - 15 Final   Performed at [facility], [address] [place], [place] [address]  . WBC 08/23/2017 14.1* 3.6 - 11.0 K/uL Final  . RBC 08/23/2017 4.76  3.80 - 5.20 MIL/uL Final  . Hemoglobin 08/23/2017 12.5  12.0 - 16.0 g/dL Final  . HCT 08/23/2017 39.4  35.0 - 47.0 % Final  . MCV 08/23/2017 82.6  80.0 - 100.0 fL Final  . MCH 08/23/2017 26.2  26.0 - 34.0 pg Final  . MCHC 08/23/2017 31.7* 32.0 - 36.0 g/dL Final  . RDW 08/23/2017 17.3* 11.5 - 14.5 % Final  . Platelets 08/23/2017 469* 150 - 440 K/uL Final  . Neutrophils Relative % 08/23/2017 79  % Final  . Neutro Abs 08/23/2017 11.2* 1.4 - 6.5 K/uL Final  . Lymphocytes Relative 08/23/2017 8  % Final  . Lymphs Abs 08/23/2017 1.1  1.0 - 3.6 K/uL Final  . Monocytes Relative 08/23/2017 6  % Final  . Monocytes Absolute 08/23/2017 0.9  0.2 - 0.9 K/uL Final  . Eosinophils Relative 08/23/2017 3  % Final  . Eosinophils Absolute 08/23/2017 0.4  0 - 0.7 K/uL Final  . Basophils Relative 08/23/2017 4  % Final  . Basophils Absolute 08/23/2017 0.5* 0 - 0.1 K/uL Final   Performed at [facility], [address], [place], [place] [address]  Assessment:  JEANEEN CALA is a 84 y.o. female with JAK2+  polycythemia rubra vera (PV) diagnosed in 2008. She has undergone low phlebotomies x 2-3 to maintain a target hematocrit is 40-42.5.   She is on hydroxyurea 500 mg (1 pill) on Mondays, Wednesdays, Fridays and Saturdays (5 pills/week). Screening for von Willebrand and a platelet function was normal. She is on a baby aspirin. She has been on Plavix since her TIA/CVA.   She has a history of iron deficiency anemia status post GI evaluation. Colonoscopy in 07/2007 was negative. For persistent recurrent iron deficiency anemia, she underwent EGD and colonoscopy in 12/2011 and 09/11/2013.  She has a history of carcinoid tumor after presenting with a partial small bowel obstruction.  Abdominal and pelvic CT scan on 03/22/2015 revealed a 3 x 4.9 cm apple core lesion involving a loop of small bowel in the right mid abdomen. There was associated 4 x 1.8 cm adjacent mesenteric implant. Ileocolectomy on 03/28/2015 revealed a grade I neuroendocrine tumor (carcinoid) involving the terminal ileum and right colon.  Largest tumor was 2.5 cm. There were multiple adjacent nodules of a neuroendocrine tumor. Metastasis was in 9 of 12 lymph nodes.  Pathologic stage was T3N1.  Chromogranin was 4 on 05/07/2015, 3 on 04/13/2016, and 4 on 11/09/2016.   24 hour urine for 5HIAA was 5.2 mg/24 hours (0-14.9).  Octreotide scan on 05/31/2015 revealed no evidence of metastatic disease.  There was an indeterminate 9 mm nodule in the left apex.    Chest CT on 06/11/2015 revealed an 8 mm nodular density (spiculated on axial images) in the left apex. Chest CT on 10/10/2015 revealed a stable 8 mm pulmonary nodule in the anterior left lung apex.  Chest, abdomen and pelvic CT scan on 04/20/2016 revealed no definite evidence of recurrent or metastatic carcinoid tumor. There are no acute findings. There was a stable 8 mm pulmonary nodule in the left apex recommendations (follow-up CT in 12 months). 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 Recommendation was continued attention on follow-up or follow-up with abdominal MRI with and without contrast in 2 years.  She had shingles involving the right S1/S2 dermatome in 05/2017.    Symptomatically, she is doing well.  Exam stable.   Lower legs remains swollen; L>R. Platelet count is 469,000. Creatinine has improved to 1.22 (previously 1.37).  Her CrCl is calculated at 30.2 mL/min.   Plan: 1.  Labs today:  CBC with diff, CMP.  2.. Discuss platelet goal of < 400,000. Platelets 469,000 today. Will increased to Hydrea 500 mg daily (6 pills/week). 3.  Continue Clopidogrel as previously prescribed.  4.  Discuss renal insufficiency. Creatinine has improved.  Patient is on Chlorthalidone, which could potentially be contributory.  5.  Discuss lower extremity edema. Patient advised to start wearing knee high anti-embolism stockings during the day.  6.  RTC in 2 weeks for labs (CBC with diff). 7.  RTC in 4 weeks for MD assessment and labs (CBC with diff, CMP).   Honor Loh, NP  08/23/2017, 11:14 AM  I saw and evaluated the patient, participating in the key portions of the service and reviewing pertinent diagnostic studies and records.  I reviewed the nurse practitioner's note and agree with the findings and the plan.  The assessment and plan were discussed with the patient.  Several questions were asked by the patient and answered.   Nolon Stalls, MD 08/23/2017,11:14 AM

## 2017-09-02 DIAGNOSIS — F32A Depression, unspecified: Secondary | ICD-10-CM | POA: Insufficient documentation

## 2017-09-02 DIAGNOSIS — F329 Major depressive disorder, single episode, unspecified: Secondary | ICD-10-CM | POA: Insufficient documentation

## 2017-09-06 ENCOUNTER — Inpatient Hospital Stay: Payer: Medicare Other

## 2017-09-06 DIAGNOSIS — D45 Polycythemia vera: Secondary | ICD-10-CM

## 2017-09-06 LAB — CBC WITH DIFFERENTIAL/PLATELET
Basophils Absolute: 0.4 10*3/uL — ABNORMAL HIGH (ref 0–0.1)
Basophils Relative: 3 %
Eosinophils Absolute: 0.4 10*3/uL (ref 0–0.7)
Eosinophils Relative: 3 %
HCT: 38.8 % (ref 35.0–47.0)
Hemoglobin: 12.3 g/dL (ref 12.0–16.0)
Lymphocytes Relative: 8 %
Lymphs Abs: 1.1 10*3/uL (ref 1.0–3.6)
MCH: 26.3 pg (ref 26.0–34.0)
MCHC: 31.6 g/dL — ABNORMAL LOW (ref 32.0–36.0)
MCV: 83.3 fL (ref 80.0–100.0)
Monocytes Absolute: 0.7 10*3/uL (ref 0.2–0.9)
Monocytes Relative: 5 %
Neutro Abs: 11 10*3/uL — ABNORMAL HIGH (ref 1.4–6.5)
Neutrophils Relative %: 81 %
Platelets: 432 10*3/uL (ref 150–440)
RBC: 4.66 MIL/uL (ref 3.80–5.20)
RDW: 17.7 % — ABNORMAL HIGH (ref 11.5–14.5)
WBC: 13.6 10*3/uL — ABNORMAL HIGH (ref 3.6–11.0)

## 2017-09-08 ENCOUNTER — Telehealth: Payer: Self-pay | Admitting: *Deleted

## 2017-09-08 NOTE — Telephone Encounter (Signed)
-----   Message from Lequita Asal, MD sent at 09/07/2017  6:07 PM EDT ----- Regarding: Please contact patient  Plaetlet count slowly improving.  Continue current hydroxyurea dosing.  M  ----- Message ----- From: Interface, Lab In [place] Sent: 09/06/2017  11:41 AM To: Lequita Asal, MD

## 2017-09-08 NOTE — Telephone Encounter (Signed)
Called patient to inform her that her platelets are slowly improving.  Per MD continue current dosage oc hydroxyurea.[name]

## 2017-09-20 ENCOUNTER — Inpatient Hospital Stay: Payer: Medicare Other | Attending: Hematology and Oncology

## 2017-09-20 ENCOUNTER — Encounter: Payer: Self-pay | Admitting: Hematology and Oncology

## 2017-09-20 ENCOUNTER — Inpatient Hospital Stay (HOSPITAL_BASED_OUTPATIENT_CLINIC_OR_DEPARTMENT_OTHER): Payer: Medicare Other | Admitting: Hematology and Oncology

## 2017-09-20 VITALS — BP 136/80 | HR 69 | Temp 97.6°F | Resp 18 | Wt 131.1 lb

## 2017-09-20 DIAGNOSIS — R609 Edema, unspecified: Secondary | ICD-10-CM | POA: Insufficient documentation

## 2017-09-20 DIAGNOSIS — N289 Disorder of kidney and ureter, unspecified: Secondary | ICD-10-CM | POA: Insufficient documentation

## 2017-09-20 DIAGNOSIS — D45 Polycythemia vera: Secondary | ICD-10-CM | POA: Insufficient documentation

## 2017-09-20 LAB — COMPREHENSIVE METABOLIC PANEL
ALT: 16 U/L (ref 14–54)
AST: 17 U/L (ref 15–41)
Albumin: 4.3 g/dL (ref 3.5–5.0)
Alkaline Phosphatase: 45 U/L (ref 38–126)
Anion gap: 10 (ref 5–15)
BUN: 33 mg/dL — ABNORMAL HIGH (ref 6–20)
CO2: 24 mmol/L (ref 22–32)
Calcium: 9.5 mg/dL (ref 8.9–10.3)
Chloride: 103 mmol/L (ref 101–111)
Creatinine, Ser: 1.12 mg/dL — ABNORMAL HIGH (ref 0.44–1.00)
GFR calc Af Amer: 51 mL/min — ABNORMAL LOW (ref >60)
GFR calc non Af Amer: 44 mL/min — ABNORMAL LOW (ref >60)
Glucose, Bld: 159 mg/dL — ABNORMAL HIGH (ref 65–99)
Potassium: 4.2 mmol/L (ref 3.5–5.1)
Sodium: 137 mmol/L (ref 135–145)
Total Bilirubin: 0.7 mg/dL (ref 0.3–1.2)
Total Protein: 7.2 g/dL (ref 6.5–8.1)

## 2017-09-20 LAB — CBC WITH DIFFERENTIAL/PLATELET
Basophils Absolute: 0.1 10*3/uL (ref 0–0.1)
Basophils Relative: 1 %
Eosinophils Absolute: 0.3 10*3/uL (ref 0–0.7)
Eosinophils Relative: 2 %
HCT: 39.9 % (ref 35.0–47.0)
Hemoglobin: 12.8 g/dL (ref 12.0–16.0)
Lymphocytes Relative: 8 %
Lymphs Abs: 0.9 10*3/uL — ABNORMAL LOW (ref 1.0–3.6)
MCH: 26.8 pg (ref 26.0–34.0)
MCHC: 32.1 g/dL (ref 32.0–36.0)
MCV: 83.5 fL (ref 80.0–100.0)
Monocytes Absolute: 0.6 10*3/uL (ref 0.2–0.9)
Monocytes Relative: 5 %
Neutro Abs: 9.1 10*3/uL — ABNORMAL HIGH (ref 1.4–6.5)
Neutrophils Relative %: 84 %
Platelets: 335 10*3/uL (ref 150–440)
RBC: 4.79 MIL/uL (ref 3.80–5.20)
RDW: 17.8 % — ABNORMAL HIGH (ref 11.5–14.5)
WBC: 10.8 10*3/uL (ref 3.6–11.0)

## 2017-09-20 NOTE — Progress Notes (Signed)
Pt in for follow up. Denies any concerns or difficulties today.  States "doing well".

## 2017-09-20 NOTE — Progress Notes (Signed)
[facility] day:  09/20/2017   Chief Complaint: [name] is a 84 y.o. 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  HPI:  The patient was last seen in the medical oncology clinic on 08/23/2017.  At that time,  She was doing well.  Exam was stable.   Lower legs remained swollen; L>R. Platelet count was 469,000. Creatinine had improved to 1.22 (previously 1.37).  CrCl was calculated at 30.2 mL/min.   Hydroxyurea was increased to 6 pills/week.  CBC on 09/06/2017 revealed a hematocrit of 38.8, hemoglobin 12.3, MCV 83.3, platelets 432,000, WBC 13,600 with an ANC of 11,000.  During the interim, patient is doing well. She states, "I am feeling better". Patient notes that her energy level is to be expected for her age. Patient's peripheral edema has improved with the use of the compression stocking. Patient complains of arthritic pains in her knees. She denies any B symptoms or interval infections.  Patient is eating well. Her weight is down 1 pound. Patient denies pain in the clinic today.    Past Medical History:  Diagnosis Date  . Adenocarcinoma in situ of cervix   . Arthritis    hands  . Breast cancer ([place])   . Colon cancer ([place])   . Diabetes mellitus without complication ([place])   . Endometrial carcinoma ([facility])    s/p total abdominal hysterectomy  . H/O compression fracture of spine 2014   thoracic spine  . H/O polycythemia vera   . H/O TIA (transient ischemic attack) and stroke 09/2014, 03/2015   No deficits  . Hypertension   . Hyperuricemia   . Microalbuminuria   . Polycythemia vera ([place])   . Recurrent falls   . Skin cancer    face, legs  . Stroke [facility]) 2008   no deficits  . Trochanteric bursitis   . Varicose veins    treated    Past Surgical History:  Procedure Laterality Date  . ABDOMINAL HYSTERECTOMY    . BOWEL RESECTION  N/A 03/28/2015   Procedure: SMALL BOWEL RESECTION;  Surgeon: Leonie Green, MD;  Location: [facility] ORS;  Service: General;  Laterality: N/A;  . CATARACT EXTRACTION W/ INTRAOCULAR LENS IMPLANT Right   . CATARACT EXTRACTION W/PHACO Left 10/07/2015   Procedure: CATARACT EXTRACTION PHACO AND INTRAOCULAR LENS PLACEMENT (IOC);  Surgeon: Ronnell Freshwater, MD;  Location: [place];  Service: Ophthalmology;  Laterality: Left;  DIABETIC - oral meds VISION BLUE  . EXPLORATORY LAPAROTOMY     for fibroids  . TONSILLECTOMY      Family History  Problem Relation Age of Onset  . Cancer Brother        AML  . Heart attack Mother   . Breast cancer Mother   . Heart attack Father   . Heart attack Sister   . Diabetes Sister   Her brother died of AML.  Social History:  reports that she has quit smoking. She has never used smokeless tobacco. She reports that she does not drink alcohol or use drugs.  The patient's husband died of AML.  She has no family in [place]. 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 The patient is alone today.  Allergies:  Allergies  Allergen Reactions  . Simvastatin     Other reaction(s): Muscle Pain    Current  Medications: Current Outpatient Medications  Medication Sig Dispense Refill  . alendronate (FOSAMAX) 70 MG tablet Take 70 mg by mouth once a week. Take with a full glass of water on an empty stomach.    Marland Kitchen aspirin EC 81 MG tablet Take 81 mg by mouth daily.    . calcium-vitamin D (OSCAL WITH D) 500-200 MG-UNIT per tablet Take 1 tablet by mouth daily.     . carvedilol (COREG) 6.25 MG tablet Take 6.25 mg by mouth 2 (two) times daily with a meal.    . chlorthalidone (HYGROTON) 25 MG tablet Take 25 mg by mouth daily.    . clopidogrel (PLAVIX) 75 MG tablet Take 1 tablet (75 mg total) by mouth daily. 30 tablet 0  . docusate sodium (COLACE) 250 MG capsule Take 250 mg by mouth daily as needed for  constipation.    . fexofenadine (ALLEGRA) 180 MG tablet Take 180 mg by mouth daily as needed. Reported on 10/07/2015    . hydroxyurea (HYDREA) 500 MG capsule TAKE 1 CAPSULE BY MOUTH ON MONDAY WEDNESDAY FRIDAY AND Saturday. Take  1 capsule every other Tuesday WITH FOOD (Patient taking differently: 500 mg. TAKE 1 CAPSULE BY MOUTH ON Sunday through Friday.  Do no take on Saturday) 54 capsule 1  . metFORMIN (GLUCOPHAGE) 1000 MG tablet Take 1,000 mg by mouth 2 (two) times daily.     . pioglitazone (ACTOS) 15 MG tablet Take 15 mg by mouth daily.    . polyethylene glycol (MIRALAX / GLYCOLAX) packet Take 17 g by mouth daily as needed.    . potassium chloride (K-DUR,KLOR-CON) 10 MEQ tablet Take 10 mEq by mouth daily.    . pravastatin (PRAVACHOL) 20 MG tablet Take 20 mg by mouth at bedtime.     . Probiotic Product (PROBIOTIC DAILY PO) Take 1 tablet by mouth daily.    . ramipril (ALTACE) 10 MG capsule Take 10 mg by mouth 2 (two) times daily.     . verapamil (CALAN-SR) 240 MG CR tablet Take 240 mg by mouth daily.    Marland Kitchen loperamide (IMODIUM) 2 MG capsule Take 4 mg by mouth as needed for diarrhea or loose stools.     No current facility-administered medications for this visit.     Review of Systems:  GENERAL: Feels "better and better".  No fevers or sweats.  Weight down 1 pound. PERFORMANCE STATUS (ECOG): 1 HEENT: No visual changes, runny nose, sore throat, mouth sores or tenderness. Lungs: No shortness of breath or cough. No hemoptysis. Cardiac: No chest pain, palpitations, orthopnea, or PND. GI: No nausea, vomiting, diarrhea, melena or hematochezia. GU: No urgency, frequency, dysuria, or hematuria.  Bladder leakage. Musculoskeletal: No back pain. Bilateral knee pain (R>L). No muscle tenderness. Extremities: Baker's cyst.  Lower extremity edema; improved. No pain. Skin: h/o shingles.  Bruising.  No ulcers or lesions. Neuro: No headache, numbness or weakness, balance or coordination  issues. Endocrine: Diabetes.  No thyroid issues, hot flashes or night sweats. Psych: No mood changes, depression or anxiety. Pain: No focal pain.  Review of systems: All other systems reviewed and found to be negative.  Physical Exam:  Blood pressure 136/80, pulse 69, temperature 97.6 F (36.4 C), temperature source Tympanic, resp. rate 18, weight 131 lb 2 oz (59.5 kg), SpO2 98 %.  GENERAL: Well developed, well nourished, woman sitting comfortably in the exam room in no acute distress MENTAL STATUS: Alert and oriented to person, place and time. HEAD: Short styled dark blonde hair. Normocephalic, atraumatic, face  symmetric, no Cushingoid features. EYES: Glasses. Blue eyes s/p cataract surgery. Pupils equal round and reactive to light and accomodation. No conjunctivitis or scleral icterus. ENT: Oropharynx clear without lesion. Tongue normal. Mucous membranes moist.  RESPIRATORY: Clear to auscultation without rales, wheezes or rhonchi. CARDIOVASCULAR: Regular rate and rhythm without murmur, rub or gallop. ABDOMEN: Soft, non-tender, with active bowel sounds, and no hepatosplenomegaly. No masses. SKIN: Bruising on bilateral lower extremities. No rashes or area of compromised integrity. Right S1/S2 dermatome unremarkable. EXTREMITIES: Trace left lower extremity edema.  No skin discoloration or tenderness. No palpable cords. LYMPH NODES: No palpable cervical, supraclavicular, axillary or inguinal adenopathy  NEUROLOGICAL: Unremarkable. PSYCH: Appropriate.    Appointment on 09/20/2017  Component Date Value Ref Range Status  . Sodium 09/20/2017 137  135 - 145 mmol/L Final  . Potassium 09/20/2017 4.2  3.5 - 5.1 mmol/L Final  . Chloride 09/20/2017 103  101 - 111 mmol/L Final  . CO2 09/20/2017 24  22 - 32 mmol/L Final  . Glucose, Bld 09/20/2017 159* 65 - 99 mg/dL Final  . BUN 09/20/2017 33* 6 - 20 mg/dL Final  . Creatinine, Ser 09/20/2017 1.12* 0.44 - 1.00 mg/dL Final  .  Calcium 09/20/2017 9.5  8.9 - 10.3 mg/dL Final  . Total Protein 09/20/2017 7.2  6.5 - 8.1 g/dL Final  . Albumin 09/20/2017 4.3  3.5 - 5.0 g/dL Final  . AST 09/20/2017 17  15 - 41 U/L Final  . ALT 09/20/2017 16  14 - 54 U/L Final  . Alkaline Phosphatase 09/20/2017 45  38 - 126 U/L Final  . Total Bilirubin 09/20/2017 0.7  0.3 - 1.2 mg/dL Final  . GFR calc non Af Amer 09/20/2017 44* >60 mL/min Final  . GFR calc Af Amer 09/20/2017 51* >60 mL/min Final   Comment: (NOTE) The eGFR has been calculated using the CKD EPI equation. This calculation has not been validated in all clinical situations. eGFR's persistently <60 mL/min signify possible Chronic Kidney Disease.   Georgiann Hahn gap 09/20/2017 10  5 - 15 Final   Performed at [facility], [address] [place], [place] [address]  . WBC 09/20/2017 10.8  3.6 - 11.0 K/uL Final  . RBC 09/20/2017 4.79  3.80 - 5.20 MIL/uL Final  . Hemoglobin 09/20/2017 12.8  12.0 - 16.0 g/dL Final  . HCT 09/20/2017 39.9  35.0 - 47.0 % Final  . MCV 09/20/2017 83.5  80.0 - 100.0 fL Final  . MCH 09/20/2017 26.8  26.0 - 34.0 pg Final  . MCHC 09/20/2017 32.1  32.0 - 36.0 g/dL Final  . RDW 09/20/2017 17.8* 11.5 - 14.5 % Final  . Platelets 09/20/2017 335  150 - 440 K/uL Final  . Neutrophils Relative % 09/20/2017 84  % Final  . Neutro Abs 09/20/2017 9.1* 1.4 - 6.5 K/uL Final  . Lymphocytes Relative 09/20/2017 8  % Final  . Lymphs Abs 09/20/2017 0.9* 1.0 - 3.6 K/uL Final  . Monocytes Relative 09/20/2017 5  % Final  . Monocytes Absolute 09/20/2017 0.6  0.2 - 0.9 K/uL Final  . Eosinophils Relative 09/20/2017 2  % Final  . Eosinophils Absolute 09/20/2017 0.3  0 - 0.7 K/uL Final  . Basophils Relative 09/20/2017 1  % Final  . Basophils Absolute 09/20/2017 0.1  0 - 0.1 K/uL Final   Performed at [facility], [address], [place], [place] [address]     Assessment:  [name] is a 84 y.o. female with JAK2+ polycythemia rubra  vera (PV) diagnosed in 2008.  She has undergone low phlebotomies x 2-3 to maintain a target hematocrit is 40-42.5.   She is on hydroxyurea 500 mg (1 pill) on Mondays, Wednesdays, Fridays and Saturdays (5 pills/week). Screening for von Willebrand and a platelet function was normal. She is on a baby aspirin. She has been on Plavix since her TIA/CVA.   She has a history of iron deficiency anemia status post GI evaluation. Colonoscopy in 07/2007 was negative. For persistent recurrent iron deficiency anemia, she underwent EGD and colonoscopy in 12/2011 and 09/11/2013.  She has a history of carcinoid tumor after presenting with a partial small bowel obstruction.  Abdominal and pelvic CT scan on 03/22/2015 revealed a 3 x 4.9 cm apple core lesion involving a loop of small bowel in the right mid abdomen. There was associated 4 x 1.8 cm adjacent mesenteric implant. Ileocolectomy on 03/28/2015 revealed a grade I neuroendocrine tumor (carcinoid) involving the terminal ileum and right colon.  Largest tumor was 2.5 cm. There were multiple adjacent nodules of a neuroendocrine tumor. Metastasis was in 9 of 12 lymph nodes.  Pathologic stage was T3N1.  Chromogranin was 4 on 05/07/2015, 3 on 04/13/2016, and 4 on 11/09/2016.   24 hour urine for 5HIAA was 5.2 mg/24 hours (0-14.9).  Octreotide scan on 05/31/2015 revealed no evidence of metastatic disease.  There was an indeterminate 9 mm nodule in the left apex.    Chest CT on 06/11/2015 revealed an 8 mm nodular density (spiculated on axial images) in the left apex. Chest CT on 10/10/2015 revealed a stable 8 mm pulmonary nodule in the anterior left lung apex.  Chest, abdomen and pelvic CT scan on 04/20/2016 revealed no definite evidence of recurrent or metastatic carcinoid tumor. There are no acute findings. There was a stable 8 mm pulmonary nodule in the left apex recommendations (follow-up CT in 12 months). 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 Recommendation was continued attention on follow-up or follow-up with abdominal MRI with and without contrast in 2 years.  She had shingles involving the right S1/S2 dermatome in 05/2017.    Symptomatically, she is doing well.  Exam stable.   Lower leg edema improved with compression stockings.  Platelet count is 335,000. Creatinine has improved to 1.12 (previously 1.22).  Plan: 1.  Labs today:  CBC with diff, CMP.  2.  Discuss platelet goal of < 400,000. Platelets 335,000 today. Continue current dose of Hydrea. 3.  Continue Clopidogrel as previously prescribed.  4.  Discuss renal insufficiency. Creatinine has improved.  Patient is on Chlorthalidone, which could potentially be contributory.  5.  Discuss lower extremity edema. Continue wearing knee high anti-embolism stockings during the day.  6.  RTC monthly for labs (CBC with diff). 7.  RTC in 3 months for MD assessment and labs (CBC with diff, CMP).   Honor Loh, NP  09/20/2017, 11:05 AM  I saw and evaluated the patient, participating in the key portions of the service and reviewing pertinent diagnostic studies and records.  I reviewed the nurse practitioner's note and agree with the findings and the plan.  The assessment and plan were discussed with the patient.  Several questions were asked by the patient and answered.   Nolon Stalls, MD 09/20/2017,11:05 AM

## 2017-10-25 ENCOUNTER — Inpatient Hospital Stay: Payer: Medicare Other | Attending: Hematology and Oncology

## 2017-10-25 ENCOUNTER — Other Ambulatory Visit: Payer: Self-pay

## 2017-10-25 DIAGNOSIS — D45 Polycythemia vera: Secondary | ICD-10-CM | POA: Diagnosis not present

## 2017-10-25 LAB — CBC WITH DIFFERENTIAL/PLATELET
Basophils Absolute: 0.3 10*3/uL — ABNORMAL HIGH (ref 0–0.1)
Basophils Relative: 3 %
Eosinophils Absolute: 0.3 10*3/uL (ref 0–0.7)
Eosinophils Relative: 2 %
HCT: 39.1 % (ref 35.0–47.0)
Hemoglobin: 12.3 g/dL (ref 12.0–16.0)
Lymphocytes Relative: 9 %
Lymphs Abs: 1 10*3/uL (ref 1.0–3.6)
MCH: 26.8 pg (ref 26.0–34.0)
MCHC: 31.5 g/dL — ABNORMAL LOW (ref 32.0–36.0)
MCV: 85.2 fL (ref 80.0–100.0)
Monocytes Absolute: 0.6 10*3/uL (ref 0.2–0.9)
Monocytes Relative: 5 %
Neutro Abs: 9.3 10*3/uL — ABNORMAL HIGH (ref 1.4–6.5)
Neutrophils Relative %: 81 %
Platelets: 453 10*3/uL — ABNORMAL HIGH (ref 150–440)
RBC: 4.59 MIL/uL (ref 3.80–5.20)
RDW: 19.2 % — ABNORMAL HIGH (ref 11.5–14.5)
WBC: 11.4 10*3/uL — ABNORMAL HIGH (ref 3.6–11.0)

## 2017-10-28 ENCOUNTER — Inpatient Hospital Stay
Admission: EM | Admit: 2017-10-28 | Discharge: 2017-10-29 | DRG: 871 | Disposition: A | Discharge status: Other Acute Inpatient Hospital | Payer: Medicare Other | Attending: Internal Medicine | Admitting: Internal Medicine

## 2017-10-28 ENCOUNTER — Emergency Department: Payer: Medicare Other

## 2017-10-28 ENCOUNTER — Other Ambulatory Visit: Payer: Self-pay

## 2017-10-28 ENCOUNTER — Encounter: Payer: Self-pay | Admitting: Radiology

## 2017-10-28 DIAGNOSIS — Z87891 Personal history of nicotine dependence: Secondary | ICD-10-CM | POA: Diagnosis not present

## 2017-10-28 DIAGNOSIS — I2510 Atherosclerotic heart disease of native coronary artery without angina pectoris: Secondary | ICD-10-CM | POA: Diagnosis present

## 2017-10-28 DIAGNOSIS — R4182 Altered mental status, unspecified: Secondary | ICD-10-CM

## 2017-10-28 DIAGNOSIS — I253 Aneurysm of heart: Secondary | ICD-10-CM | POA: Diagnosis present

## 2017-10-28 DIAGNOSIS — Z806 Family history of leukemia: Secondary | ICD-10-CM

## 2017-10-28 DIAGNOSIS — G9341 Metabolic encephalopathy: Secondary | ICD-10-CM | POA: Diagnosis present

## 2017-10-28 DIAGNOSIS — D45 Polycythemia vera: Secondary | ICD-10-CM | POA: Diagnosis present

## 2017-10-28 DIAGNOSIS — E872 Acidosis, unspecified: Secondary | ICD-10-CM

## 2017-10-28 DIAGNOSIS — I615 Nontraumatic intracerebral hemorrhage, intraventricular: Secondary | ICD-10-CM | POA: Diagnosis not present

## 2017-10-28 DIAGNOSIS — I700 Atherosclerosis of aorta: Secondary | ICD-10-CM | POA: Diagnosis present

## 2017-10-28 DIAGNOSIS — Z803 Family history of malignant neoplasm of breast: Secondary | ICD-10-CM | POA: Diagnosis not present

## 2017-10-28 DIAGNOSIS — Z7984 Long term (current) use of oral hypoglycemic drugs: Secondary | ICD-10-CM

## 2017-10-28 DIAGNOSIS — J690 Pneumonitis due to inhalation of food and vomit: Secondary | ICD-10-CM | POA: Diagnosis present

## 2017-10-28 DIAGNOSIS — Z8542 Personal history of malignant neoplasm of other parts of uterus: Secondary | ICD-10-CM

## 2017-10-28 DIAGNOSIS — A419 Sepsis, unspecified organism: Principal | ICD-10-CM | POA: Diagnosis present

## 2017-10-28 DIAGNOSIS — K920 Hematemesis: Secondary | ICD-10-CM | POA: Diagnosis present

## 2017-10-28 DIAGNOSIS — Z833 Family history of diabetes mellitus: Secondary | ICD-10-CM

## 2017-10-28 DIAGNOSIS — Z8249 Family history of ischemic heart disease and other diseases of the circulatory system: Secondary | ICD-10-CM

## 2017-10-28 DIAGNOSIS — R7989 Other specified abnormal findings of blood chemistry: Secondary | ICD-10-CM

## 2017-10-28 DIAGNOSIS — J9601 Acute respiratory failure with hypoxia: Secondary | ICD-10-CM | POA: Diagnosis not present

## 2017-10-28 DIAGNOSIS — Z853 Personal history of malignant neoplasm of breast: Secondary | ICD-10-CM

## 2017-10-28 DIAGNOSIS — I614 Nontraumatic intracerebral hemorrhage in cerebellum: Secondary | ICD-10-CM | POA: Diagnosis present

## 2017-10-28 DIAGNOSIS — Z9842 Cataract extraction status, left eye: Secondary | ICD-10-CM

## 2017-10-28 DIAGNOSIS — G936 Cerebral edema: Secondary | ICD-10-CM | POA: Diagnosis present

## 2017-10-28 DIAGNOSIS — R0902 Hypoxemia: Secondary | ICD-10-CM | POA: Diagnosis present

## 2017-10-28 DIAGNOSIS — Z888 Allergy status to other drugs, medicaments and biological substances status: Secondary | ICD-10-CM

## 2017-10-28 DIAGNOSIS — Z9841 Cataract extraction status, right eye: Secondary | ICD-10-CM

## 2017-10-28 DIAGNOSIS — I5023 Acute on chronic systolic (congestive) heart failure: Secondary | ICD-10-CM | POA: Diagnosis present

## 2017-10-28 DIAGNOSIS — I110 Hypertensive heart disease with heart failure: Secondary | ICD-10-CM | POA: Diagnosis present

## 2017-10-28 DIAGNOSIS — I214 Non-ST elevation (NSTEMI) myocardial infarction: Secondary | ICD-10-CM | POA: Diagnosis present

## 2017-10-28 DIAGNOSIS — R29810 Facial weakness: Secondary | ICD-10-CM | POA: Diagnosis present

## 2017-10-28 DIAGNOSIS — Z7983 Long term (current) use of bisphosphonates: Secondary | ICD-10-CM

## 2017-10-28 DIAGNOSIS — Z9049 Acquired absence of other specified parts of digestive tract: Secondary | ICD-10-CM

## 2017-10-28 DIAGNOSIS — Z8673 Personal history of transient ischemic attack (TIA), and cerebral infarction without residual deficits: Secondary | ICD-10-CM

## 2017-10-28 DIAGNOSIS — I613 Nontraumatic intracerebral hemorrhage in brain stem: Secondary | ICD-10-CM | POA: Diagnosis not present

## 2017-10-28 DIAGNOSIS — Z85038 Personal history of other malignant neoplasm of large intestine: Secondary | ICD-10-CM | POA: Diagnosis not present

## 2017-10-28 DIAGNOSIS — Z90710 Acquired absence of both cervix and uterus: Secondary | ICD-10-CM

## 2017-10-28 DIAGNOSIS — J9621 Acute and chronic respiratory failure with hypoxia: Secondary | ICD-10-CM | POA: Diagnosis present

## 2017-10-28 DIAGNOSIS — I313 Pericardial effusion (noninflammatory): Secondary | ICD-10-CM | POA: Diagnosis present

## 2017-10-28 DIAGNOSIS — Z8541 Personal history of malignant neoplasm of cervix uteri: Secondary | ICD-10-CM

## 2017-10-28 DIAGNOSIS — E1165 Type 2 diabetes mellitus with hyperglycemia: Secondary | ICD-10-CM | POA: Diagnosis present

## 2017-10-28 DIAGNOSIS — R778 Other specified abnormalities of plasma proteins: Secondary | ICD-10-CM

## 2017-10-28 DIAGNOSIS — D473 Essential (hemorrhagic) thrombocythemia: Secondary | ICD-10-CM | POA: Diagnosis present

## 2017-10-28 DIAGNOSIS — Z7902 Long term (current) use of antithrombotics/antiplatelets: Secondary | ICD-10-CM

## 2017-10-28 DIAGNOSIS — Z961 Presence of intraocular lens: Secondary | ICD-10-CM | POA: Diagnosis present

## 2017-10-28 DIAGNOSIS — Z85828 Personal history of other malignant neoplasm of skin: Secondary | ICD-10-CM

## 2017-10-28 DIAGNOSIS — Z7982 Long term (current) use of aspirin: Secondary | ICD-10-CM

## 2017-10-28 LAB — COMPREHENSIVE METABOLIC PANEL
ALT: 17 U/L (ref 14–54)
AST: 31 U/L (ref 15–41)
Albumin: 4.3 g/dL (ref 3.5–5.0)
Alkaline Phosphatase: 48 U/L (ref 38–126)
Anion gap: 14 (ref 5–15)
BUN: 25 mg/dL — ABNORMAL HIGH (ref 6–20)
CO2: 25 mmol/L (ref 22–32)
Calcium: 9.2 mg/dL (ref 8.9–10.3)
Chloride: 99 mmol/L — ABNORMAL LOW (ref 101–111)
Creatinine, Ser: 0.87 mg/dL (ref 0.44–1.00)
GFR calc Af Amer: >60 mL/min (ref >60)
GFR calc non Af Amer: 60 mL/min — ABNORMAL LOW (ref >60)
Glucose, Bld: 310 mg/dL — ABNORMAL HIGH (ref 65–99)
Potassium: 3.5 mmol/L (ref 3.5–5.1)
Sodium: 138 mmol/L (ref 135–145)
Total Bilirubin: 0.8 mg/dL (ref 0.3–1.2)
Total Protein: 7.5 g/dL (ref 6.5–8.1)

## 2017-10-28 LAB — CBC WITH DIFFERENTIAL/PLATELET
Band Neutrophils: 2 %
Basophils Absolute: 1 10*3/uL — ABNORMAL HIGH (ref 0–0.1)
Basophils Relative: 4 %
Blasts: 0 %
Eosinophils Absolute: 0.3 10*3/uL (ref 0–0.7)
Eosinophils Relative: 1 %
HCT: 45.9 % (ref 35.0–47.0)
Hemoglobin: 14.4 g/dL (ref 12.0–16.0)
Lymphocytes Relative: 9 %
Lymphs Abs: 2.3 10*3/uL (ref 1.0–3.6)
MCH: 27 pg (ref 26.0–34.0)
MCHC: 31.3 g/dL — ABNORMAL LOW (ref 32.0–36.0)
MCV: 86.1 fL (ref 80.0–100.0)
Metamyelocytes Relative: 0 %
Monocytes Absolute: 0.8 10*3/uL (ref 0.2–0.9)
Monocytes Relative: 3 %
Myelocytes: 0 %
Neutro Abs: 21.4 10*3/uL — ABNORMAL HIGH (ref 1.4–6.5)
Neutrophils Relative %: 80 %
Other: 1 %
Platelets: 698 10*3/uL — ABNORMAL HIGH (ref 150–440)
Promyelocytes Relative: 0 %
RBC: 5.33 MIL/uL — ABNORMAL HIGH (ref 3.80–5.20)
RDW: 19.9 % — ABNORMAL HIGH (ref 11.5–14.5)
WBC: 26.1 10*3/uL — ABNORMAL HIGH (ref 3.6–11.0)
nRBC: 0 /100 WBC

## 2017-10-28 LAB — URINALYSIS, COMPLETE (UACMP) WITH MICROSCOPIC
Bacteria, UA: NONE SEEN
Bilirubin Urine: NEGATIVE
Glucose, UA: >=500 mg/dL — AB
Hgb urine dipstick: NEGATIVE
Ketones, ur: 20 mg/dL — AB
Leukocytes, UA: NEGATIVE
Nitrite: NEGATIVE
Protein, ur: 100 mg/dL — AB
Specific Gravity, Urine: 1.012 (ref 1.005–1.030)
pH: 6 (ref 5.0–8.0)

## 2017-10-28 LAB — LACTIC ACID, PLASMA
Lactic Acid, Venous: 3.1 mmol/L (ref 0.5–1.9)
Lactic Acid, Venous: 3 mmol/L (ref 0.5–1.9)
Lactic Acid, Venous: 2.2 mmol/L (ref 0.5–1.9)

## 2017-10-28 LAB — TROPONIN I
Troponin I: 1.02 ng/mL (ref <0.03)
Troponin I: 4.02 ng/mL (ref <0.03)

## 2017-10-28 LAB — BRAIN NATRIURETIC PEPTIDE: B Natriuretic Peptide: 1208 pg/mL — ABNORMAL HIGH (ref 0.0–100.0)

## 2017-10-28 LAB — MRSA PCR SCREENING: MRSA by PCR: NEGATIVE

## 2017-10-28 MED ORDER — VANCOMYCIN HCL IN DEXTROSE 1-5 GM/200ML-% IV SOLN
1000.0000 mg | Freq: Once | INTRAVENOUS | Status: AC
  Administered 2017-10-28: 1000 mg via INTRAVENOUS
  Filled 2017-10-28: qty 200

## 2017-10-28 MED ORDER — PIPERACILLIN-TAZOBACTAM 3.375 G IVPB
3.3750 g | Freq: Three times a day (TID) | INTRAVENOUS | Status: DC
  Administered 2017-10-29: 3.375 g via INTRAVENOUS
  Filled 2017-10-28: qty 50

## 2017-10-28 MED ORDER — SODIUM CHLORIDE 0.9 % IV SOLN: 250.0000 mL | INTRAVENOUS | Status: DC | PRN

## 2017-10-28 MED ORDER — VANCOMYCIN HCL IN DEXTROSE 1-5 GM/200ML-% IV SOLN
1000.0000 mg | INTRAVENOUS | Status: DC
  Administered 2017-10-29: 1000 mg via INTRAVENOUS
  Filled 2017-10-28: qty 200

## 2017-10-28 MED ORDER — SODIUM CHLORIDE 0.9 % IV SOLN
8.0000 mg/h | INTRAVENOUS | Status: DC
  Administered 2017-10-28 – 2017-10-29 (×2): 8 mg/h via INTRAVENOUS
  Filled 2017-10-28 (×2): qty 80

## 2017-10-28 MED ORDER — PIPERACILLIN-TAZOBACTAM 3.375 G IVPB 30 MIN
3.3750 g | Freq: Once | INTRAVENOUS | Status: AC
  Administered 2017-10-28: 3.375 g via INTRAVENOUS
  Filled 2017-10-28: qty 50

## 2017-10-28 MED ORDER — ONDANSETRON HCL 4 MG/2ML IJ SOLN
4.0000 mg | Freq: Four times a day (QID) | INTRAMUSCULAR | Status: DC | PRN
  Administered 2017-10-28: 4 mg via INTRAVENOUS
  Filled 2017-10-28: qty 2

## 2017-10-28 MED ORDER — ACETAMINOPHEN 650 MG RE SUPP: 650.0000 mg | Freq: Four times a day (QID) | RECTAL | Status: DC | PRN

## 2017-10-28 MED ORDER — RAMIPRIL 5 MG PO CAPS: 5.0000 mg | ORAL_CAPSULE | Freq: Every day | ORAL | Status: DC

## 2017-10-28 MED ORDER — ACETAMINOPHEN 325 MG PO TABS: 650.0000 mg | ORAL_TABLET | Freq: Four times a day (QID) | ORAL | Status: DC | PRN

## 2017-10-28 MED ORDER — SODIUM CHLORIDE 0.9 % IV SOLN
Freq: Once | INTRAVENOUS | Status: AC
  Administered 2017-10-28: 16:00:00 via INTRAVENOUS

## 2017-10-28 MED ORDER — PANTOPRAZOLE SODIUM 40 MG IV SOLR
80.0000 mg | Freq: Once | INTRAVENOUS | Status: AC
Start: 2017-10-28 — End: 2017-10-28
  Administered 2017-10-28: 80 mg via INTRAVENOUS
  Filled 2017-10-28 (×2): qty 80

## 2017-10-28 MED ORDER — SENNOSIDES-DOCUSATE SODIUM 8.6-50 MG PO TABS: 1.0000 | ORAL_TABLET | Freq: Every evening | ORAL | Status: DC | PRN

## 2017-10-28 MED ORDER — SODIUM CHLORIDE 0.9% FLUSH
3.0000 mL | Freq: Two times a day (BID) | INTRAVENOUS | Status: DC
  Administered 2017-10-28: 3 mL via INTRAVENOUS

## 2017-10-28 MED ORDER — FUROSEMIDE 10 MG/ML IJ SOLN
60.0000 mg | Freq: Two times a day (BID) | INTRAMUSCULAR | Status: DC
  Administered 2017-10-29 (×2): 60 mg via INTRAVENOUS
  Filled 2017-10-28 (×2): qty 6

## 2017-10-28 MED ORDER — IOPAMIDOL (ISOVUE-370) INJECTION 76%
75.0000 mL | Freq: Once | INTRAVENOUS | Status: AC | PRN
  Administered 2017-10-28: 75 mL via INTRAVENOUS

## 2017-10-28 MED ORDER — FUROSEMIDE 10 MG/ML IJ SOLN
40.0000 mg | Freq: Once | INTRAMUSCULAR | Status: AC
  Administered 2017-10-28: 40 mg via INTRAVENOUS
  Filled 2017-10-28: qty 4

## 2017-10-28 MED ORDER — ENOXAPARIN SODIUM 40 MG/0.4ML ~~LOC~~ SOLN: 40.0000 mg | SUBCUTANEOUS | Status: DC

## 2017-10-28 MED ORDER — SODIUM CHLORIDE 0.9% FLUSH: 3.0000 mL | INTRAVENOUS | Status: DC | PRN

## 2017-10-28 MED ORDER — ONDANSETRON HCL 4 MG/2ML IJ SOLN
INTRAMUSCULAR | Status: AC
  Filled 2017-10-28: qty 2

## 2017-10-28 MED ORDER — ONDANSETRON HCL 4 MG/2ML IJ SOLN
4.0000 mg | Freq: Once | INTRAMUSCULAR | Status: AC
  Administered 2017-10-28: 4 mg via INTRAVENOUS

## 2017-10-28 MED ORDER — ONDANSETRON HCL 4 MG PO TABS: 4.0000 mg | ORAL_TABLET | Freq: Four times a day (QID) | ORAL | Status: DC | PRN

## 2017-10-28 NOTE — ED Notes (Signed)
RT called and notified of patient presentation and requested to come to bedside.

## 2017-10-28 NOTE — Progress Notes (Signed)
Pharmacy Antibiotic Note  [name] is a 84 y.o. female admitted on 10/28/2017 with sepsis.  Pharmacy has been consulted for vanc/zosyn dosing.  Plan: Patient received vanc 1g and zosyn 3.375g IV x 1  Will continue vanc 1g IV q24h and will draw vanc trough 05/12 @ 1700 prior to 4th dose. Will continue zosyn 3.375g IV q8h  Ke 0.0351 T1/2 19 ~ 24 hrs Goal trough 15 - 20 mcg/mL  Height: 5\' 1"  (154.9 cm) Weight: 125 lb (56.7 kg) IBW/kg (Calculated) : 47.8  Temp (24hrs), Avg:98.2 F (36.8 C), Min:98.2 F (36.8 C), Max:98.2 F (36.8 C)  Recent Labs  Lab 10/25/17 1236 10/28/17 1504 10/28/17 1759  WBC 11.4* 26.1*  --   CREATININE  --  0.87  --   LATICACIDVEN  --  3.1* 3.0*    Estimated Creatinine Clearance: 37 mL/min (by C-G formula based on SCr of 0.87 mg/dL).    Allergies  Allergen Reactions  . Simvastatin     Other reaction(s): Muscle Pain   Thank you for allowing pharmacy to be a part of this patient's care.  Tobie Lords, PharmD, BCPS Clinical Pharmacist 10/28/2017

## 2017-10-28 NOTE — Consult Note (Signed)
Name: [name] MRN: [id number] DOB: [birth year]    ADMISSION DATE:  10/28/2017 CONSULTATION DATE: 10/28/2017  REFERRING MD : Dr. Estanislado Pandy   CHIEF COMPLAINT: Altered Mental Status   BRIEF PATIENT DESCRIPTION:  84 yo female admitted with acute encephalopathy, hematemesis, pericardial effusion, and acute on chronic hypoxic respiratory failure secondary to pulmonary edema and possible aspiration pneumonia requiring Bipap  SIGNIFICANT EVENTS  05/9-Pt admitted to stepdown unit   STUDIES:  CTA Chest 05/9>>Pericardial effusion and cardiomegaly.  Coronary artery disease. Parenchymal changes consistent with pulmonary edema and small pleural effusions. 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 Recommend annual imaging followup by CTA or MRA. This recommendation follows 2010 ACCF/AHA/AATS/ACR/ASA/SCA/SCAI/SIR/STS/SVM Guidelines for the Diagnosis and Management of Patients with Thoracic Aortic Disease. Circulation.2010; 121: J449-E010 Aortic aneurysm NOS (ICD10-I71.9). Aortic Atherosclerosis (ICD10-I70.0). Stable probable scar at the LEFT lung apex. Numerous thoracic compression fractures.  HISTORY OF PRESENT ILLNESS:  This is an 84 yo female with a PMH of Stroke (no defecits), Recurrent Falls, Polycythemia Vera, Microalbuminuria, HTN, TIA, Endometrial Carcinoma, Diabetes Mellitus, Colon Cancer, Breast Cancer, Arthritis, and Adenocarcinoma in Situ of Cervix.  She presented to [facility] ER via EMS on 05/9 with AMS.  Per ER notes the pt reported to her caregiver she did not feel well, vomited a large amount of black emesis and briefly became unresponsive caregiver concerned pt likely aspirated. Upon EMS arrival pts O2 sats on RA were in the 70's, therefore she was placed on NRB en route to the ER.  In the ER pt placed on Bipap due to respiratory distress and hypoxia.  CTA Chest revealed pericardial effusion, small pleural effusions, and pulmonary edema.   She received iv abx and lasix.  She was subsequently admitted to the stepdown unit by hospitalist team for further workup and treatment.  PAST MEDICAL HISTORY :   has a past medical history of Adenocarcinoma in situ of cervix, Arthritis, Breast cancer ([place]), Colon cancer ([place]), Diabetes mellitus without complication ([place]), Endometrial carcinoma ([place]), H/O compression fracture of spine (2014), H/O polycythemia vera, H/O TIA (transient ischemic attack) and stroke (09/2014, 03/2015), Hypertension, Hyperuricemia, Microalbuminuria, Polycythemia vera ([place]), Recurrent falls, Skin cancer, Stroke ([place]) (2008), Trochanteric bursitis, and Varicose veins.  has a past surgical history that includes Tonsillectomy; Exploratory laparotomy; Cataract extraction w/ intraocular lens implant (Right); Abdominal hysterectomy; Bowel resection (N/A, 03/28/2015); and Cataract extraction w/PHACO (Left, 10/07/2015). Prior to Admission medications   Medication Sig Start Date End Date Taking? Authorizing Provider  alendronate (FOSAMAX) 70 MG tablet Take 70 mg by mouth once a week. Take with a full glass of water on an empty stomach.   Yes [provider]  aspirin EC 81 MG tablet Take 81 mg by mouth daily.   Yes [provider]  calcium-vitamin D (OSCAL WITH D) 500-200 MG-UNIT per tablet Take 1 tablet by mouth daily.    Yes [provider]  carvedilol (COREG) 6.25 MG tablet Take 6.25 mg by mouth 2 (two) times daily with a meal.   Yes [provider]  chlorthalidone (HYGROTON) 25 MG tablet Take 25 mg by mouth daily.   Yes [provider]  clopidogrel (PLAVIX) 75 MG tablet Take 1 tablet (75 mg total) by mouth daily. 12/23/14  Yes Fritzi Mandes, MD  docusate sodium (COLACE) 250 MG capsule Take 250 mg by mouth daily as needed for constipation.   Yes [provider]  fexofenadine (ALLEGRA) 180 MG tablet Take 180 mg by mouth daily  as needed.    Yes [provider]  hydroxyurea  (HYDREA) 500 MG capsule TAKE 1 CAPSULE BY MOUTH ON MONDAY WEDNESDAY FRIDAY AND Saturday. Take  1 capsule every other Tuesday WITH FOOD Patient taking differently: 500 mg. TAKE 1 CAPSULE BY MOUTH ON Sunday through Friday.  Do no take on Saturday 07/06/17  Yes Karen Kitchens, NP  loperamide (IMODIUM) 2 MG capsule Take 4 mg by mouth as needed for diarrhea or loose stools.   Yes [provider]  metFORMIN (GLUCOPHAGE) 1000 MG tablet Take 1,000 mg by mouth 2 (two) times daily.    Yes [provider]  pioglitazone (ACTOS) 15 MG tablet Take 15 mg by mouth daily. 03/16/17 03/16/18 Yes [provider]  polyethylene glycol (MIRALAX / GLYCOLAX) packet Take 17 g by mouth daily as needed.   Yes [provider]  potassium chloride (K-DUR,KLOR-CON) 10 MEQ tablet Take 10 mEq by mouth daily.   Yes [provider]  pravastatin (PRAVACHOL) 20 MG tablet Take 20 mg by mouth at bedtime.    Yes [provider]  Probiotic Product (PROBIOTIC DAILY PO) Take 1 tablet by mouth daily.   Yes [provider]  ramipril (ALTACE) 10 MG capsule Take 10 mg by mouth 2 (two) times daily.    Yes [provider]  verapamil (CALAN-SR) 240 MG CR tablet Take 240 mg by mouth daily.   Yes [provider]   Allergies  Allergen Reactions  . Simvastatin     Other reaction(s): Muscle Pain    FAMILY HISTORY:  family history includes Breast cancer in her mother; Cancer in her brother; Diabetes in her sister; Heart attack in her father, mother, and sister. SOCIAL HISTORY:  reports that she has quit smoking. She has never used smokeless tobacco. She reports that she does not drink alcohol or use drugs.  REVIEW OF SYSTEMS:  Positives in BOLD  Constitutional: Negative for fever, chills, weight loss, malaise/fatigue and diaphoresis.  HENT: Negative for hearing loss, ear pain, nosebleeds, congestion, sore throat, neck pain, tinnitus and ear discharge.   Eyes: Negative  for blurred vision, double vision, photophobia, pain, discharge and redness.  Respiratory: Negative for cough, hemoptysis, sputum production, shortness of breath, wheezing and stridor.   Cardiovascular: Negative for chest pain, palpitations, orthopnea, claudication, leg swelling and PND.  Gastrointestinal: Negative for heartburn, nausea, vomiting, abdominal pain, diarrhea, constipation, blood in stool and melena.  Genitourinary: Negative for dysuria, urgency, frequency, hematuria and flank pain.  Musculoskeletal: Negative for myalgias, back pain, joint pain and falls.  Skin: Negative for itching and rash.  Neurological: Negative for dizziness, tingling, tremors, sensory change, speech change, focal weakness, seizures, loss of consciousness, weakness and headaches.  Endo/Heme/Allergies: Negative for environmental allergies and polydipsia. Does not bruise/bleed easily.  SUBJECTIVE:  No complaints at this time  VITAL SIGNS: Pulse Rate:  [101-120] 113 (05/09 1930) Resp:  [20-32] 25 (05/09 1930) BP: (154-174)/(93-114) 170/109 (05/09 1930) SpO2:  [92 %-98 %] 93 % (05/09 1930) Weight:  [52.2 kg (115 lb)] 52.2 kg (115 lb) (05/09 1439)  PHYSICAL EXAMINATION: General: acutely ill appearing female, NAD on nasal canula  Neuro: lethargic, disoriented to time and situation, follows commands, PERRL  HEENT: supple, no JVD Cardiovascular: sinus tach, no M/R/G Lungs: diminished throughout, even, non labored  Abdomen: +BS x4, obese, soft, non tender, non distended  Musculoskeletal: normal bulk and tone, no edema  Skin: intact no rashes or lesions   Recent Labs  Lab 10/28/17 1504  NA  138  K 3.5  CL 99*  CO2 25  BUN 25*  CREATININE 0.87  GLUCOSE 310*   Recent Labs  Lab 10/25/17 1236 10/28/17 1504  HGB 12.3 14.4  HCT 39.1 45.9  WBC 11.4* 26.1*  PLT 453* 698*   Dg Chest 1 View  Result Date: 10/28/2017 CLINICAL DATA:  Lethargy and weakness. EXAM: CHEST  1 VIEW COMPARISON:  Chest x-rays  dated 03/23/2017 and 11/10/2012. FINDINGS: Cardiomegaly is stable. Atherosclerotic changes noted at the aortic arch. Bilateral perihilar opacities, presumed edema, LEFT greater than RIGHT. No pleural effusion or pneumothorax seen. Osseous structures about the chest are unremarkable. IMPRESSION: 1. Cardiomegaly with new perihilar opacities, presumably pulmonary edema, suggesting CHF/volume overload. Pneumonia is considered less likely. 2. Aortic atherosclerosis. Electronically Signed   By: Franki Cabot M.D.   On: 10/28/2017 15:48   Ct Angio Chest Pe W And/or Wo Contrast  Result Date: 10/28/2017 CLINICAL DATA:  Patient presents to ED via [facility] from home (patient lives alone). Patients caregiver found her today in bed, alert and oriented but very lethargic and weak. Upon EMS arrival SpO2 73% on RA. EXAM: CT ANGIOGRAPHY CHEST WITH CONTRAST TECHNIQUE: Multidetector CT imaging of the chest was performed using the standard protocol during bolus administration of intravenous contrast. Multiplanar CT image reconstructions and MIPs were obtained to evaluate the vascular anatomy. CONTRAST:  68mL ISOVUE-370 IOPAMIDOL (ISOVUE-370) INJECTION 76% COMPARISON:  Chest x-ray 10/28/2017 FINDINGS: Cardiovascular: There is small pericardial effusion measuring maximum thickness of 1.0 centimeters. The heart is enlarged. There is significant coronary artery calcification. There is atherosclerotic calcification of the thoracic aorta thoracic aorta measures 3.1 centimeters. Descending aorta is 3.8 centimeters and is stable in appearance. No displaced intimal calcifications or evidence for dissection. Mediastinum/Nodes: The visualized portion of the thyroid gland has a normal appearance. Esophagus is normal in appearance. No significant adenopathy. Lungs/Pleura: There are small bilateral pleural effusions. There is a stable irregular density at the LEFT lung apex, measuring 8 millimeters in diameter. There is smooth septal wall  thickening. There are patchy areas of ground-glass density, primarily with a dependent distribution. No suspicious pulmonary nodules. No focal areas of consolidation. Upper Abdomen: There is atherosclerotic calcification of the abdominal aorta. Musculoskeletal: There are numerous compression fractures of the thoracic spine, stable in appearance. Review of the MIP images confirms the above findings. IMPRESSION: 1. Pericardial effusion and cardiomegaly.  Coronary artery disease. 2. Parenchymal changes consistent with pulmonary edema and small pleural effusions. 3. 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 Recommend annual imaging followup by CTA or MRA. This recommendation follows 2010 ACCF/AHA/AATS/ACR/ASA/SCA/SCAI/SIR/STS/SVM Guidelines for the Diagnosis and Management of Patients with Thoracic Aortic Disease. Circulation.2010; 121: Y694-W546 4. Aortic aneurysm NOS (ICD10-I71.9). Aortic Atherosclerosis (ICD10-I70.0). 5. Stable probable scar at the LEFT lung apex. 6. Numerous thoracic compression fractures. Electronically Signed   By: Nolon Nations M.D.   On: 10/28/2017 17:32    ASSESSMENT / PLAN: Acute on chronic hypoxic respiratory failure secondary to pulmonary edema and possible aspiration pneumonia  Pericardial Effusion Lactic acidosis  Acute Encephalopathy  Elevated troponin secondary to demand ischemia vs. NSTEMI  Hematemesis Hx: Diabetes Mellitus, HTN, TIA, and CVA  P: Prn Bipap for hypoxia and/or dyspnea  Prn bronchodilator therapy  IV lasix  Continuous telemetry monitoring  Echo pending Trend troponin  Trend WBC and monitor fever curve  Trend PCT and lactic acid Follow cultures Continue abx for now  Trend BMP Replace electrolytes as indicated  Monitor UOP VTE  px: SCD's avoid chemical px for now  Trend CBC  Check coags  Monitor for s/sx of bleeding and transfuse for hgb <7 Protonix gtt  GI consulted appreciate input    SSI   Marda Stalker, [place] Pager [phone] (please enter 7 digits) PCCM Consult Pager [phone] (please enter 7 digits)

## 2017-10-28 NOTE — ED Notes (Signed)
Lorrie, RN aware of bed assigned

## 2017-10-28 NOTE — ED Triage Notes (Signed)
Patient presents to ED via [facility] from home (patient lives alone). Patients caregiver found her today in bed, alert and oriented but very lethargic and weak. Upon EMS arrival SpO2 73% on RA. Patient placed on partial non rebreather, SpO2 96% on arrival to ED.

## 2017-10-28 NOTE — ED Notes (Signed)
Suction catheter placed on patient due to admin of lasix

## 2017-10-28 NOTE — ED Provider Notes (Signed)
[facility] Emergency Department Provider Note       Time seen: ----------------------------------------- 2:34 PM on 10/28/2017 -----------------------------------------   I have reviewed the triage vital signs and the nursing notes.  HISTORY   Chief Complaint No chief complaint on file.    HPI [name] is a 84 y.o. female with a history of arthritis, colon cancer, diabetes, compression fracture, TIA, CVA who presents to the ED for altered mental status.  Patient went to the restroom at her home and caregiver reported she was not feeling well.  She then subsequently went to lay down and EMS was called.  She was noted to have difficulty breathing, room air saturations were reportedly in the 70s according to EMS.  She was placed on a nonrebreather in route.  Patient states she has had TIAs and stroke in the past with the symptoms.  She denies fevers, chills, chest pain but states she has had vomiting and diarrhea.  Past Medical History:  Diagnosis Date  . Adenocarcinoma in situ of cervix   . Arthritis    hands  . Breast cancer ([place])   . Colon cancer ([place])   . Diabetes mellitus without complication ([place])   . Endometrial carcinoma ([facility])    s/p total abdominal hysterectomy  . H/O compression fracture of spine 2014   thoracic spine  . H/O polycythemia vera   . H/O TIA (transient ischemic attack) and stroke 09/2014, 03/2015   No deficits  . Hypertension   . Hyperuricemia   . Microalbuminuria   . Polycythemia vera ([place])   . Recurrent falls   . Skin cancer    face, legs  . Stroke [facility]) 2008   no deficits  . Trochanteric bursitis   . Varicose veins    treated    Patient Active Problem List   Diagnosis Date Noted  . Pancreatic lesion 05/24/2017  . 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 Pulmonary nodule, left 06/04/2015  . 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 Cerebral thrombosis with cerebral infarction  04/03/2015  . Cancer of right colon ([place]) 03/28/2015  . CVA (cerebral infarction) 12/21/2014  . Polycythemia vera ([place]) 06/22/2006    Past Surgical History:  Procedure Laterality Date  . ABDOMINAL HYSTERECTOMY    . BOWEL RESECTION N/A 03/28/2015   Procedure: SMALL BOWEL RESECTION;  Surgeon: Leonie Green, MD;  Location: [facility] ORS;  Service: General;  Laterality: N/A;  . CATARACT EXTRACTION W/ INTRAOCULAR LENS IMPLANT Right   . CATARACT EXTRACTION W/PHACO Left 10/07/2015   Procedure: CATARACT EXTRACTION PHACO AND INTRAOCULAR LENS PLACEMENT (IOC);  Surgeon: Ronnell Freshwater, MD;  Location: [place];  Service: Ophthalmology;  Laterality: Left;  DIABETIC - oral meds VISION BLUE  . EXPLORATORY LAPAROTOMY     for fibroids  . TONSILLECTOMY      Allergies Simvastatin  Social History Social History   Tobacco Use  . Smoking status: Former Research scientist (life sciences)  . Smokeless tobacco: Never Used  . Tobacco comment: quit 30+ yrs ago  Substance Use Topics  . Alcohol use: No  . Drug use: No   Review of Systems Constitutional: Negative for fever. Cardiovascular: Negative for chest pain. Respiratory: Positive for shortness of breath Gastrointestinal: Negative for abdominal pain, positive for vomiting and diarrhea Musculoskeletal: Negative for back pain. Skin: Negative for rash. Neurological: Negative for headaches, positive for weakness  All systems negative/normal/unremarkable except as stated in the HPI  ____________________________________________   PHYSICAL EXAM:  VITAL  SIGNS: ED Triage Vitals  Enc Vitals Group     BP      Pulse      Resp      Temp      Temp src      SpO2      Weight      Height      Head Circumference      Peak Flow      Pain Score      Pain Loc      Pain Edu?      Excl. in [place]?    Constitutional: Lethargic but responds easily to verbal stimuli, mild to moderate distress Eyes: Conjunctivae are normal. Normal extraocular  movements. ENT   Head: Normocephalic and atraumatic.   Nose: No congestion/rhinnorhea.   Mouth/Throat: Mucous membranes are moist.   Neck: No stridor. Cardiovascular: Normal rate, regular rhythm. No murmurs, rubs, or gallops. Respiratory: Tachypnea with scattered rhonchi bilaterally Gastrointestinal: Soft and nontender. Normal bowel sounds Musculoskeletal: Nontender with normal range of motion in extremities. No lower extremity tenderness nor edema. Neurologic:  Normal speech and language. No gross focal neurologic deficits are appreciated.  Skin:  Skin is cool, dry and intact.  Pallor is noted Psychiatric: Mood and affect are normal. Speech and behavior are normal.  ____________________________________________  EKG: Interpreted by me.  Sinus tach with a rate of 117 bpm, likely left anterior fascicular block, possible old anterior infarct, normal QT.  ____________________________________________  ED COURSE:  As part of my medical decision making, I reviewed the following data within the [place] History obtained from family if available, nursing notes, old chart and ekg, as well as notes from prior ED visits. Patient presented for altered mental status and dyspnea with hypoxia and subsequently had large amount of black emesis, we will assess with labs and imaging as indicated at this time. Clinical Course as of Oct 29 1723  Thu Oct 28, 2017  1455 Caregiver that found the patient states she likely aspirated during the process and she was unconscious at one point during this event.   [JW]    Clinical Course User Index [JW] [name], MD   Procedures ____________________________________________   LABS (pertinent positives/negatives)  Labs Reviewed  CBC WITH DIFFERENTIAL/PLATELET - Abnormal; Notable for the following components:      Result Value   WBC 26.1 (*)    RBC 5.33 (*)    MCHC 31.3 (*)    RDW 19.9 (*)    Platelets 698 (*)     Neutro Abs 21.4 (*)    Basophils Absolute 1.0 (*)    All other components within normal limits  COMPREHENSIVE METABOLIC PANEL - Abnormal; Notable for the following components:   Chloride 99 (*)    Glucose, Bld 310 (*)    BUN 25 (*)    GFR calc non Af Amer 60 (*)    All other components within normal limits  TROPONIN I - Abnormal; Notable for the following components:   Troponin I 1.02 (*)    All other components within normal limits  LACTIC ACID, PLASMA - Abnormal; Notable for the following components:   Lactic Acid, Venous 3.1 (*)    All other components within normal limits  CULTURE, BLOOD (ROUTINE X 2)  CULTURE, BLOOD (ROUTINE X 2)  BLOOD GAS, VENOUS  LACTIC ACID, PLASMA  URINALYSIS, COMPLETE (UACMP) WITH MICROSCOPIC  PH, GASTRIC FLUID (GASTROCCULT CARD)  BRAIN NATRIURETIC PEPTIDE   CRITICAL CARE Performed by: Rulon Eisenmenger  Mireya Meditz   Total critical care time: 30 minutes  Critical care time was exclusive of separately billable procedures and treating other patients.  Critical care was necessary to treat or prevent imminent or life-threatening deterioration.  Critical care was time spent personally by me on the following activities: development of treatment plan with patient and/or surrogate as well as nursing, discussions with consultants, evaluation of patient's response to treatment, examination of patient, obtaining history from patient or surrogate, ordering and performing treatments and interventions, ordering and review of laboratory studies, ordering and review of radiographic studies, pulse oximetry and re-evaluation of patient's condition.  RADIOLOGY Images were viewed by me  Chest x-ray  IMPRESSION: 1. Cardiomegaly with new perihilar opacities, presumably pulmonary edema, suggesting CHF/volume overload. Pneumonia is considered less likely. 2. Aortic atherosclerosis. CTA chest  IMPRESSION:  1. Pericardial effusion and cardiomegaly. Coronary artery disease.   2. Parenchymal changes consistent with pulmonary edema and small  pleural effusions.  3. Atherosclerotic calcification of the thoracic aorta stable  aneurysmal dilatation of the aortic arch. Recommend annual imaging  followup by CTA or MRA. This recommendation follows 2010  ACCF/AHA/AATS/ACR/ASA/SCA/SCAI/SIR/STS/SVM Guidelines for the  Diagnosis and Management of Patients with Thoracic Aortic Disease.  Circulation.2010; 121: X902-I097  4. Aortic aneurysm NOS (ICD10-I71.9). Aortic Atherosclerosis  (ICD10-I70.0).  5. Stable probable scar at the LEFT lung apex.  6. Numerous thoracic compression fractures.    ____________________________________________  DIFFERENTIAL DIAGNOSIS   Dehydration, electrolyte abnormality, sepsis, pneumonia, PE, COPD, hypercarbic respiratory failure  FINAL ASSESSMENT AND PLAN  Hypoxia, weakness possible aspiration pneumonia, lactic acidosis, elevated troponin   Plan: The patient had presented for weakness and respiratory distress. Patient's labs did reveal market leukocytosis, lactic acidosis and elevated troponin likely all secondary to possible aspiration. Patient's imaging initially suggested more cardiomegaly with perihilar opacities, on CT angiogram this does more reasonable pulmonary edema.  We will continue her on antibiotics, I will discuss with the hospitalist for admission.   Laurence Aly, MD   Note: This note was generated in part or whole with voice recognition software. Voice recognition is usually quite accurate but there are transcription errors that can and very often do occur. I apologize for any typographical errors that were not detected and corrected.     [name], MD 10/28/17 [phone]

## 2017-10-28 NOTE — ED Notes (Signed)
attempted to call report to CCU. Unit currently trying to adjust other patient placements in order to assume care for this pt

## 2017-10-28 NOTE — Progress Notes (Signed)
CODE SEPSIS - PHARMACY COMMUNICATION  **Broad Spectrum Antibiotics should be administered within 1 hour of Sepsis diagnosis**  Time Code Sepsis Called/Page Received: n/a  Antibiotics Ordered: vanc/zosyn  Time of 1st antibiotic administration: 1557  Additional action taken by pharmacy:   If necessary, Name of Provider/Nurse Contacted:     Tobie Lords ,PharmD Clinical Pharmacist  10/28/2017  9:46 PM

## 2017-10-28 NOTE — H&P (Signed)
[facility] [place] NAME: [name]    MR#:  [id number]  DATE OF BIRTH:  Jan 07, 1932  DATE OF ADMISSION:  10/28/2017  PRIMARY CARE PHYSICIAN: Baxter Hire, MD   REQUESTING/REFERRING PHYSICIAN:   CHIEF COMPLAINT:   Chief Complaint  Patient presents with  . Altered Mental Status    HISTORY OF PRESENT ILLNESS: [name]  is a 84 y.o. female with a known history of carcinoma cervix, diabetes mellitus type 2, endometrial carcinoma, polycythemia vera, history of TIA in the past was found by the caregiver to be confused and lethargic.  Patient oxygen saturation was also very low.  She was put on nonrebreather mask and brought to the emergency room by EMS.  Patient is arousable to loud verbal commands and gets back to sleep.  She is on oxygen via nasal cannula at 5 L maintaining saturation of 92%.  She was worked up in the emergency room the CTA chest which showed a pleural effusions and opacities.  Also pericardial effusion was also noted.  Patient was given IV Lasix for diuresis.  BNP was elevated WBC count is also elevated.  Patient was given broad-spectrum IV antibiotics for possible aspiration pneumonia.  Hospitalist service was consulted for further care.  Lactic acid level is also elevated.  Much history could be obtained from the patient as she is lethargic and confused.  According to the niece patient also had some vomiting this morning and the vomitus contained some blood. Most of the history obtained from patient's niece was at bedside.Patient usually very independent in activities of daily living still drives and pretty active.  Does not use any home oxygen. PAST MEDICAL HISTORY:   Past Medical History:  Diagnosis Date  . Adenocarcinoma in situ of cervix   . Arthritis    hands  . Breast cancer ([place])   . Colon cancer ([place])   . Diabetes mellitus without complication ([place])   . Endometrial carcinoma ([facility])    s/p total abdominal  hysterectomy  . H/O compression fracture of spine 2014   thoracic spine  . H/O polycythemia vera   . H/O TIA (transient ischemic attack) and stroke 09/2014, 03/2015   No deficits  . Hypertension   . Hyperuricemia   . Microalbuminuria   . Polycythemia vera ([place])   . Recurrent falls   . Skin cancer    face, legs  . Stroke [facility]) 2008   no deficits  . Trochanteric bursitis   . Varicose veins    treated  Essential thrombocythemia  PAST SURGICAL HISTORY:  Past Surgical History:  Procedure Laterality Date  . ABDOMINAL HYSTERECTOMY    . BOWEL RESECTION N/A 03/28/2015   Procedure: SMALL BOWEL RESECTION;  Surgeon: Leonie Green, MD;  Location: [facility] ORS;  Service: General;  Laterality: N/A;  . CATARACT EXTRACTION W/ INTRAOCULAR LENS IMPLANT Right   . CATARACT EXTRACTION W/PHACO Left 10/07/2015   Procedure: CATARACT EXTRACTION PHACO AND INTRAOCULAR LENS PLACEMENT (IOC);  Surgeon: Ronnell Freshwater, MD;  Location: [place];  Service: Ophthalmology;  Laterality: Left;  DIABETIC - oral meds VISION BLUE  . EXPLORATORY LAPAROTOMY     for fibroids  . TONSILLECTOMY      SOCIAL HISTORY:  Social History   Tobacco Use  . Smoking status: Former Research scientist (life sciences)  . Smokeless tobacco: Never Used  . Tobacco comment: quit 30+ yrs ago  Substance Use Topics  . Alcohol use: No    FAMILY HISTORY:  Family History  Problem Relation Age of Onset  . Cancer Brother        AML  . Heart attack Mother   . Breast cancer Mother   . Heart attack Father   . Heart attack Sister   . Diabetes Sister     DRUG ALLERGIES:  Allergies  Allergen Reactions  . Simvastatin     Other reaction(s): Muscle Pain    REVIEW OF SYSTEMS:  Could not be completely obtain secondary to confusion and lethargy  MEDICATIONS AT HOME:  Prior to Admission medications   Medication Sig Start Date End Date Taking? Authorizing Provider  alendronate (FOSAMAX) 70 MG tablet Take 70 mg by mouth once a week. Take  with a full glass of water on an empty stomach.   Yes [provider]  aspirin EC 81 MG tablet Take 81 mg by mouth daily.   Yes [provider]  calcium-vitamin D (OSCAL WITH D) 500-200 MG-UNIT per tablet Take 1 tablet by mouth daily.    Yes [provider]  carvedilol (COREG) 6.25 MG tablet Take 6.25 mg by mouth 2 (two) times daily with a meal.   Yes [provider]  chlorthalidone (HYGROTON) 25 MG tablet Take 25 mg by mouth daily.   Yes [provider]  clopidogrel (PLAVIX) 75 MG tablet Take 1 tablet (75 mg total) by mouth daily. 12/23/14  Yes Fritzi Mandes, MD  docusate sodium (COLACE) 250 MG capsule Take 250 mg by mouth daily as needed for constipation.   Yes [provider]  fexofenadine (ALLEGRA) 180 MG tablet Take 180 mg by mouth daily as needed.    Yes [provider]  hydroxyurea (HYDREA) 500 MG capsule TAKE 1 CAPSULE BY MOUTH ON MONDAY WEDNESDAY FRIDAY AND Saturday. Take  1 capsule every other Tuesday WITH FOOD Patient taking differently: 500 mg. TAKE 1 CAPSULE BY MOUTH ON Sunday through Friday.  Do no take on Saturday 07/06/17  Yes Karen Kitchens, NP  loperamide (IMODIUM) 2 MG capsule Take 4 mg by mouth as needed for diarrhea or loose stools.   Yes [provider]  metFORMIN (GLUCOPHAGE) 1000 MG tablet Take 1,000 mg by mouth 2 (two) times daily.    Yes [provider]  pioglitazone (ACTOS) 15 MG tablet Take 15 mg by mouth daily. 03/16/17 03/16/18 Yes [provider]  polyethylene glycol (MIRALAX / GLYCOLAX) packet Take 17 g by mouth daily as needed.   Yes [provider]  potassium chloride (K-DUR,KLOR-CON) 10 MEQ tablet Take 10 mEq by mouth daily.   Yes [provider]  pravastatin (PRAVACHOL) 20 MG tablet Take 20 mg by mouth at bedtime.    Yes [provider]  Probiotic Product (PROBIOTIC DAILY PO) Take 1 tablet by mouth daily.   Yes [provider]  ramipril (ALTACE) 10  MG capsule Take 10 mg by mouth 2 (two) times daily.    Yes [provider]  verapamil (CALAN-SR) 240 MG CR tablet Take 240 mg by mouth daily.   Yes [provider]      PHYSICAL EXAMINATION:   VITAL SIGNS: Blood pressure (!) 171/114, pulse (!) 109, resp. rate (!) 32, height 5\' 1"  (1.549 m), weight 52.2 kg (115 lb), SpO2 92 %.  GENERAL:  84 y.o.-year-old patient lying in the bed in mild respiratory distress on oxygen via nasal cannula 5 L EYES: Pupils equal, round, reactive to light and accommodation. No scleral icterus. Extraocular muscles intact.  HEENT: Head atraumatic, normocephalic.  Oropharynx and nasopharynx clear.  NECK:  Supple, no jugular venous distention. No thyroid enlargement, no tenderness.  LUNGS: Creased breath sounds bilaterally, bibasilar crepitations heard. No use of accessory muscles of respiration.  CARDIOVASCULAR: S1, S2 tachycardia noted. No murmurs, rubs, or gallops.  ABDOMEN: Soft, nontender, nondistended. Bowel sounds present. No organomegaly or mass.  EXTREMITIES: No pedal edema, cyanosis, or clubbing.  NEUROLOGIC: Awake and arousable to loud verbal commands Not completely oriented to time place and person Moves all extremities PSYCHIATRIC: Could not be assessed SKIN: No obvious rash, lesion, or ulcer.   LABORATORY PANEL:   CBC Recent Labs  Lab 10/25/17 1236 10/28/17 1504  WBC 11.4* 26.1*  HGB 12.3 14.4  HCT 39.1 45.9  PLT 453* 698*  MCV 85.2 86.1  MCH 26.8 27.0  MCHC 31.5* 31.3*  RDW 19.2* 19.9*  LYMPHSABS 1.0 2.3  MONOABS 0.6 0.8  EOSABS 0.3 0.3  BASOSABS 0.3* 1.0*   ------------------------------------------------------------------------------------------------------------------  Chemistries  Recent Labs  Lab 10/28/17 1504  NA 138  K 3.5  CL 99*  CO2 25  GLUCOSE 310*  BUN 25*  CREATININE 0.87  CALCIUM 9.2  AST 31  ALT 17  ALKPHOS 48  BILITOT 0.8    ------------------------------------------------------------------------------------------------------------------ estimated creatinine clearance is 37 mL/min (by C-G formula based on SCr of 0.87 mg/dL). ------------------------------------------------------------------------------------------------------------------ No results for input(s): TSH, T4TOTAL, T3FREE, THYROIDAB in the last 72 hours.  Invalid input(s): FREET3   Coagulation profile No results for input(s): INR, PROTIME in the last 168 hours. ------------------------------------------------------------------------------------------------------------------- No results for input(s): DDIMER in the last 72 hours. -------------------------------------------------------------------------------------------------------------------  Cardiac Enzymes Recent Labs  Lab 10/28/17 1504  TROPONINI 1.02*   ------------------------------------------------------------------------------------------------------------------ Invalid input(s): POCBNP  ---------------------------------------------------------------------------------------------------------------  Urinalysis    Component Value Date/Time   COLORURINE YELLOW (A) 10/28/2017 1759   APPEARANCEUR CLEAR (A) 10/28/2017 1759   APPEARANCEUR Clear 11/10/2012 1347   LABSPEC 1.012 10/28/2017 1759   LABSPEC 1.012 11/10/2012 1347   PHURINE 6.0 10/28/2017 1759   GLUCOSEU >=500 (A) 10/28/2017 1759   GLUCOSEU Negative 11/10/2012 1347   HGBUR NEGATIVE 10/28/2017 1759   BILIRUBINUR NEGATIVE 10/28/2017 1759   BILIRUBINUR Negative 11/10/2012 1347   KETONESUR 20 (A) 10/28/2017 1759   PROTEINUR 100 (A) 10/28/2017 1759   NITRITE NEGATIVE 10/28/2017 1759   LEUKOCYTESUR NEGATIVE 10/28/2017 1759   LEUKOCYTESUR 1+ 11/10/2012 1347     RADIOLOGY: Dg Chest 1 View  Result Date: 10/28/2017 CLINICAL DATA:  Lethargy and weakness. EXAM: CHEST  1 VIEW COMPARISON:  Chest x-rays dated 03/23/2017 and  11/10/2012. FINDINGS: Cardiomegaly is stable. Atherosclerotic changes noted at the aortic arch. Bilateral perihilar opacities, presumed edema, LEFT greater than RIGHT. No pleural effusion or pneumothorax seen. Osseous structures about the chest are unremarkable. IMPRESSION: 1. Cardiomegaly with new perihilar opacities, presumably pulmonary edema, suggesting CHF/volume overload. Pneumonia is considered less likely. 2. Aortic atherosclerosis. Electronically Signed   By: Franki Cabot M.D.   On: 10/28/2017 15:48   Ct Angio Chest Pe W And/or Wo Contrast  Result Date: 10/28/2017 CLINICAL DATA:  Patient presents to ED via [facility] from home (patient lives alone). Patients caregiver found her today in bed, alert and oriented but very lethargic and weak. Upon EMS arrival SpO2 73% on RA. EXAM: CT ANGIOGRAPHY CHEST WITH CONTRAST TECHNIQUE: Multidetector CT imaging of the chest was performed using the standard protocol during bolus administration of intravenous contrast. Multiplanar CT image reconstructions and MIPs were obtained to evaluate the vascular anatomy. CONTRAST:  4mL ISOVUE-370 IOPAMIDOL (ISOVUE-370) INJECTION 76% COMPARISON:  Chest  x-ray 10/28/2017 FINDINGS: Cardiovascular: There is small pericardial effusion measuring maximum thickness of 1.0 centimeters. The heart is enlarged. There is significant coronary artery calcification. There is atherosclerotic calcification of the thoracic aorta thoracic aorta measures 3.1 centimeters. Descending aorta is 3.8 centimeters and is stable in appearance. No displaced intimal calcifications or evidence for dissection. Mediastinum/Nodes: The visualized portion of the thyroid gland has a normal appearance. Esophagus is normal in appearance. No significant adenopathy. Lungs/Pleura: There are small bilateral pleural effusions. There is a stable irregular density at the LEFT lung apex, measuring 8 millimeters in diameter. There is smooth septal wall thickening. There are patchy  areas of ground-glass density, primarily with a dependent distribution. No suspicious pulmonary nodules. No focal areas of consolidation. Upper Abdomen: There is atherosclerotic calcification of the abdominal aorta. Musculoskeletal: There are numerous compression fractures of the thoracic spine, stable in appearance. Review of the MIP images confirms the above findings. IMPRESSION: 1. Pericardial effusion and cardiomegaly.  Coronary artery disease. 2. Parenchymal changes consistent with pulmonary edema and small pleural effusions. 3. 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 Recommend annual imaging followup by CTA or MRA. This recommendation follows 2010 ACCF/AHA/AATS/ACR/ASA/SCA/SCAI/SIR/STS/SVM Guidelines for the Diagnosis and Management of Patients with Thoracic Aortic Disease. Circulation.2010; 121: C166-A630 4. Aortic aneurysm NOS (ICD10-I71.9). Aortic Atherosclerosis (ICD10-I70.0). 5. Stable probable scar at the LEFT lung apex. 6. Numerous thoracic compression fractures. Electronically Signed   By: Nolon Nations M.D.   On: 10/28/2017 17:32    EKG: Orders placed or performed during the hospital encounter of 10/28/17  . ED EKG  . ED EKG    IMPRESSION AND PLAN:   [name]  is a 84 y.o. female with a known history of carcinoma cervix, diabetes mellitus type 2, endometrial carcinoma, polycythemia vera, history of TIA in the past was found by the caregiver to be confused and lethargic.  Patient has low oxygen saturation.  -Acute hypoxic respiratory failure Needs high flow oxygen Close monitoring in stepdown unit Intensivist consultation  -Pleural and pericardial effusions with fluid overload  IV Lasix for diuresis Check echocardiogram Cardiology consultation  -Aspiration pneumonia Start IV antibiotics  -Sepsis secondary to pneumonia Start patient on IV vancomycin IV Zosyn antibiotics Follow-up cultures Follow-up lactic  acid  -Altered mental status secondary to metabolic encephalopathy Treat underlying infection  -Essential thrombocythemia Monitor platelet counts  -Hematemesis N.p.o. GI consultation IV Protonix drip for now Monitoring of hemoglobin hematocrit  All the records are reviewed and case discussed with ED provider. Management plans discussed with the patient, family and they are in agreement.  CODE STATUS:Full code Code Status History    Date Active Date Inactive Code Status Order ID Comments User Context   03/28/2015 1930 04/03/2015 1642 Full Code [id number]  Leonie Green, MD Inpatient   12/20/2014 1853 12/23/2014 1752 Full Code [id number]  Vaughan Basta, MD Inpatient       TOTAL CRITICAL CARE TIME TAKING CARE OF THIS PATIENT: 55 minutes.    Saundra Shelling M.D on 10/28/2017 at 7:04 PM  Between 7am to 6pm - Pager - [phone]  After 6pm go to [email or web address] - password [email or web address] [facility]  Office  [phone]  CC: Primary care physician; Baxter Hire, MD

## 2017-10-29 ENCOUNTER — Encounter: Payer: Self-pay | Admitting: Cardiovascular Disease

## 2017-10-29 ENCOUNTER — Inpatient Hospital Stay: Payer: Medicare Other

## 2017-10-29 ENCOUNTER — Inpatient Hospital Stay (HOSPITAL_COMMUNITY)
Admission: AD | Admit: 2017-10-29 | Discharge: 2017-11-04 | DRG: 064 | Disposition: A | Discharge status: Rehabilitation Facility | Payer: Medicare Other | Source: Other Acute Inpatient Hospital | Attending: Neurology | Admitting: Neurology

## 2017-10-29 ENCOUNTER — Inpatient Hospital Stay (HOSPITAL_COMMUNITY): Payer: Medicare Other

## 2017-10-29 ENCOUNTER — Encounter
Admission: EM | Disposition: A | Discharge status: Other Acute Inpatient Hospital | Payer: Self-pay | Source: Home / Self Care | Attending: Internal Medicine

## 2017-10-29 ENCOUNTER — Inpatient Hospital Stay
Admit: 2017-10-29 | Discharge: 2017-10-29 | Disposition: A | Discharge status: Home / Self Care | Payer: Medicare Other | Attending: Internal Medicine | Admitting: Internal Medicine

## 2017-10-29 DIAGNOSIS — I69193 Ataxia following nontraumatic intracerebral hemorrhage: Secondary | ICD-10-CM | POA: Diagnosis not present

## 2017-10-29 DIAGNOSIS — I69322 Dysarthria following cerebral infarction: Secondary | ICD-10-CM | POA: Diagnosis not present

## 2017-10-29 DIAGNOSIS — J9601 Acute respiratory failure with hypoxia: Secondary | ICD-10-CM | POA: Diagnosis present

## 2017-10-29 DIAGNOSIS — I161 Hypertensive emergency: Secondary | ICD-10-CM | POA: Diagnosis present

## 2017-10-29 DIAGNOSIS — I5023 Acute on chronic systolic (congestive) heart failure: Secondary | ICD-10-CM | POA: Diagnosis present

## 2017-10-29 DIAGNOSIS — D72829 Elevated white blood cell count, unspecified: Secondary | ICD-10-CM | POA: Diagnosis not present

## 2017-10-29 DIAGNOSIS — I252 Old myocardial infarction: Secondary | ICD-10-CM | POA: Diagnosis not present

## 2017-10-29 DIAGNOSIS — E870 Hyperosmolality and hypernatremia: Secondary | ICD-10-CM | POA: Diagnosis present

## 2017-10-29 DIAGNOSIS — Z8542 Personal history of malignant neoplasm of other parts of uterus: Secondary | ICD-10-CM

## 2017-10-29 DIAGNOSIS — I615 Nontraumatic intracerebral hemorrhage, intraventricular: Secondary | ICD-10-CM

## 2017-10-29 DIAGNOSIS — I313 Pericardial effusion (noninflammatory): Secondary | ICD-10-CM | POA: Diagnosis present

## 2017-10-29 DIAGNOSIS — R278 Other lack of coordination: Secondary | ICD-10-CM | POA: Diagnosis not present

## 2017-10-29 DIAGNOSIS — R7989 Other specified abnormal findings of blood chemistry: Secondary | ICD-10-CM | POA: Diagnosis not present

## 2017-10-29 DIAGNOSIS — C182 Malignant neoplasm of ascending colon: Secondary | ICD-10-CM | POA: Diagnosis present

## 2017-10-29 DIAGNOSIS — Z8673 Personal history of transient ischemic attack (TIA), and cerebral infarction without residual deficits: Secondary | ICD-10-CM | POA: Diagnosis not present

## 2017-10-29 DIAGNOSIS — Z87891 Personal history of nicotine dependence: Secondary | ICD-10-CM

## 2017-10-29 DIAGNOSIS — D45 Polycythemia vera: Secondary | ICD-10-CM | POA: Diagnosis present

## 2017-10-29 DIAGNOSIS — I2510 Atherosclerotic heart disease of native coronary artery without angina pectoris: Secondary | ICD-10-CM | POA: Diagnosis present

## 2017-10-29 DIAGNOSIS — Z85038 Personal history of other malignant neoplasm of large intestine: Secondary | ICD-10-CM | POA: Diagnosis not present

## 2017-10-29 DIAGNOSIS — I7122 Aneurysm of the aortic arch, without rupture: Secondary | ICD-10-CM

## 2017-10-29 DIAGNOSIS — Z7902 Long term (current) use of antithrombotics/antiplatelets: Secondary | ICD-10-CM

## 2017-10-29 DIAGNOSIS — I5021 Acute systolic (congestive) heart failure: Secondary | ICD-10-CM

## 2017-10-29 DIAGNOSIS — I613 Nontraumatic intracerebral hemorrhage in brain stem: Secondary | ICD-10-CM

## 2017-10-29 DIAGNOSIS — Z9181 History of falling: Secondary | ICD-10-CM | POA: Diagnosis not present

## 2017-10-29 DIAGNOSIS — E119 Type 2 diabetes mellitus without complications: Secondary | ICD-10-CM | POA: Diagnosis present

## 2017-10-29 DIAGNOSIS — E785 Hyperlipidemia, unspecified: Secondary | ICD-10-CM

## 2017-10-29 DIAGNOSIS — I614 Nontraumatic intracerebral hemorrhage in cerebellum: Secondary | ICD-10-CM | POA: Diagnosis present

## 2017-10-29 DIAGNOSIS — Z8679 Personal history of other diseases of the circulatory system: Secondary | ICD-10-CM | POA: Diagnosis not present

## 2017-10-29 DIAGNOSIS — I214 Non-ST elevation (NSTEMI) myocardial infarction: Secondary | ICD-10-CM | POA: Diagnosis present

## 2017-10-29 DIAGNOSIS — D62 Acute posthemorrhagic anemia: Secondary | ICD-10-CM | POA: Diagnosis present

## 2017-10-29 DIAGNOSIS — I69122 Dysarthria following nontraumatic intracerebral hemorrhage: Secondary | ICD-10-CM | POA: Diagnosis not present

## 2017-10-29 DIAGNOSIS — Z8541 Personal history of malignant neoplasm of cervix uteri: Secondary | ICD-10-CM | POA: Diagnosis not present

## 2017-10-29 DIAGNOSIS — J811 Chronic pulmonary edema: Secondary | ICD-10-CM

## 2017-10-29 DIAGNOSIS — J189 Pneumonia, unspecified organism: Secondary | ICD-10-CM | POA: Diagnosis present

## 2017-10-29 DIAGNOSIS — Z79899 Other long term (current) drug therapy: Secondary | ICD-10-CM

## 2017-10-29 DIAGNOSIS — I10 Essential (primary) hypertension: Secondary | ICD-10-CM | POA: Diagnosis not present

## 2017-10-29 DIAGNOSIS — M810 Age-related osteoporosis without current pathological fracture: Secondary | ICD-10-CM | POA: Diagnosis present

## 2017-10-29 DIAGNOSIS — Z90710 Acquired absence of both cervix and uterus: Secondary | ICD-10-CM | POA: Diagnosis not present

## 2017-10-29 DIAGNOSIS — Z85828 Personal history of other malignant neoplasm of skin: Secondary | ICD-10-CM

## 2017-10-29 DIAGNOSIS — K920 Hematemesis: Secondary | ICD-10-CM

## 2017-10-29 DIAGNOSIS — Z7984 Long term (current) use of oral hypoglycemic drugs: Secondary | ICD-10-CM

## 2017-10-29 DIAGNOSIS — Z853 Personal history of malignant neoplasm of breast: Secondary | ICD-10-CM

## 2017-10-29 DIAGNOSIS — I69398 Other sequelae of cerebral infarction: Secondary | ICD-10-CM | POA: Diagnosis not present

## 2017-10-29 DIAGNOSIS — I712 Thoracic aortic aneurysm, without rupture: Secondary | ICD-10-CM | POA: Diagnosis present

## 2017-10-29 DIAGNOSIS — I110 Hypertensive heart disease with heart failure: Secondary | ICD-10-CM | POA: Diagnosis present

## 2017-10-29 DIAGNOSIS — E1165 Type 2 diabetes mellitus with hyperglycemia: Secondary | ICD-10-CM | POA: Diagnosis not present

## 2017-10-29 DIAGNOSIS — R29701 NIHSS score 1: Secondary | ICD-10-CM | POA: Diagnosis present

## 2017-10-29 DIAGNOSIS — A419 Sepsis, unspecified organism: Secondary | ICD-10-CM | POA: Diagnosis present

## 2017-10-29 DIAGNOSIS — Z789 Other specified health status: Secondary | ICD-10-CM | POA: Diagnosis not present

## 2017-10-29 DIAGNOSIS — Z9049 Acquired absence of other specified parts of digestive tract: Secondary | ICD-10-CM

## 2017-10-29 DIAGNOSIS — I69393 Ataxia following cerebral infarction: Secondary | ICD-10-CM | POA: Diagnosis not present

## 2017-10-29 DIAGNOSIS — R471 Dysarthria and anarthria: Secondary | ICD-10-CM | POA: Diagnosis present

## 2017-10-29 DIAGNOSIS — R269 Unspecified abnormalities of gait and mobility: Secondary | ICD-10-CM | POA: Diagnosis not present

## 2017-10-29 DIAGNOSIS — E876 Hypokalemia: Secondary | ICD-10-CM | POA: Diagnosis present

## 2017-10-29 DIAGNOSIS — R0902 Hypoxemia: Secondary | ICD-10-CM | POA: Diagnosis present

## 2017-10-29 DIAGNOSIS — J9690 Respiratory failure, unspecified, unspecified whether with hypoxia or hypercapnia: Secondary | ICD-10-CM

## 2017-10-29 DIAGNOSIS — E872 Acidosis: Secondary | ICD-10-CM | POA: Diagnosis not present

## 2017-10-29 HISTORY — PX: CORONARY ANGIOGRAPHY: CATH118303

## 2017-10-29 HISTORY — PX: LEFT HEART CATH: CATH118248

## 2017-10-29 LAB — PROTIME-INR
INR: 1.06
Prothrombin Time: 13.7 seconds (ref 11.4–15.2)

## 2017-10-29 LAB — CBC
HCT: 41.6 % (ref 35.0–47.0)
Hemoglobin: 13.2 g/dL (ref 12.0–16.0)
MCH: 27.2 pg (ref 26.0–34.0)
MCHC: 31.7 g/dL — ABNORMAL LOW (ref 32.0–36.0)
MCV: 85.8 fL (ref 80.0–100.0)
Platelets: 454 10*3/uL — ABNORMAL HIGH (ref 150–440)
RBC: 4.85 MIL/uL (ref 3.80–5.20)
RDW: 19.7 % — ABNORMAL HIGH (ref 11.5–14.5)
WBC: 21.2 10*3/uL — ABNORMAL HIGH (ref 3.6–11.0)

## 2017-10-29 LAB — BASIC METABOLIC PANEL
Anion gap: 11 (ref 5–15)
BUN: 27 mg/dL — ABNORMAL HIGH (ref 6–20)
CO2: 25 mmol/L (ref 22–32)
Calcium: 8.6 mg/dL — ABNORMAL LOW (ref 8.9–10.3)
Chloride: 104 mmol/L (ref 101–111)
Creatinine, Ser: 1.11 mg/dL — ABNORMAL HIGH (ref 0.44–1.00)
GFR calc Af Amer: 52 mL/min — ABNORMAL LOW (ref >60)
GFR calc non Af Amer: 45 mL/min — ABNORMAL LOW (ref >60)
Glucose, Bld: 228 mg/dL — ABNORMAL HIGH (ref 65–99)
Potassium: 3.8 mmol/L (ref 3.5–5.1)
Sodium: 140 mmol/L (ref 135–145)

## 2017-10-29 LAB — SODIUM: Sodium: 139 mmol/L (ref 135–145)

## 2017-10-29 LAB — TROPONIN I
Troponin I: 6.88 ng/mL (ref <0.03)
Troponin I: 6.25 ng/mL (ref <0.03)

## 2017-10-29 LAB — GLUCOSE, CAPILLARY
Glucose-Capillary: 207 mg/dL — ABNORMAL HIGH (ref 65–99)
Glucose-Capillary: 204 mg/dL — ABNORMAL HIGH (ref 65–99)

## 2017-10-29 LAB — APTT: aPTT: 31 seconds (ref 24–36)

## 2017-10-29 LAB — ECHOCARDIOGRAM COMPLETE
Height: 61 in
Weight: 2000.01 oz

## 2017-10-29 SURGERY — LEFT HEART CATH
Anesthesia: Moderate Sedation | Laterality: Right

## 2017-10-29 MED ORDER — STERILE WATER FOR INJECTION IJ SOLN
INTRAMUSCULAR | Status: AC
  Administered 2017-10-29: 10 mL
  Filled 2017-10-29: qty 10

## 2017-10-29 MED ORDER — STROKE: EARLY STAGES OF RECOVERY BOOK
Freq: Once | Status: AC
  Administered 2017-10-30
  Filled 2017-10-29: qty 1

## 2017-10-29 MED ORDER — LIDOCAINE HCL (PF) 1 % IJ SOLN
INTRAMUSCULAR | Status: AC
  Filled 2017-10-29: qty 30

## 2017-10-29 MED ORDER — SODIUM CHLORIDE 3 % IV SOLN
INTRAVENOUS | Status: DC
  Administered 2017-10-30 – 2017-11-02 (×8): 50 mL/h via INTRAVENOUS
  Filled 2017-10-29 (×15): qty 500

## 2017-10-29 MED ORDER — SODIUM CHLORIDE 3 % IV SOLN
INTRAVENOUS | Status: DC
  Administered 2017-10-29: 50 mL/h via INTRAVENOUS
  Filled 2017-10-29: qty 500

## 2017-10-29 MED ORDER — CLEVIDIPINE BUTYRATE 0.5 MG/ML IV EMUL: 0.0000 mg/h | INTRAVENOUS | Status: DC

## 2017-10-29 MED ORDER — SODIUM CHLORIDE 0.9% FLUSH: 3.0000 mL | INTRAVENOUS | Status: DC | PRN

## 2017-10-29 MED ORDER — INSULIN ASPART 100 UNIT/ML ~~LOC~~ SOLN
0.0000 [IU] | Freq: Three times a day (TID) | SUBCUTANEOUS | Status: DC
  Administered 2017-10-30: 2 [IU] via SUBCUTANEOUS
  Administered 2017-10-30: 5 [IU] via SUBCUTANEOUS
  Administered 2017-10-30 – 2017-10-31 (×2): 2 [IU] via SUBCUTANEOUS
  Administered 2017-10-31 – 2017-11-02 (×6): 3 [IU] via SUBCUTANEOUS
  Administered 2017-11-02: 2 [IU] via SUBCUTANEOUS
  Administered 2017-11-02: 1 [IU] via SUBCUTANEOUS
  Administered 2017-11-03: 2 [IU] via SUBCUTANEOUS
  Administered 2017-11-03: 1 [IU] via SUBCUTANEOUS
  Administered 2017-11-03 – 2017-11-04 (×3): 2 [IU] via SUBCUTANEOUS

## 2017-10-29 MED ORDER — HEPARIN (PORCINE) IN NACL 100-0.45 UNIT/ML-% IJ SOLN: 700.0000 [IU]/h | INTRAMUSCULAR | Status: DC

## 2017-10-29 MED ORDER — ASPIRIN 81 MG PO CHEW
81.0000 mg | CHEWABLE_TABLET | ORAL | Status: AC
  Administered 2017-10-29: 81 mg via ORAL

## 2017-10-29 MED ORDER — SENNOSIDES-DOCUSATE SODIUM 8.6-50 MG PO TABS
1.0000 | ORAL_TABLET | Freq: Two times a day (BID) | ORAL | Status: DC
  Administered 2017-10-31 – 2017-11-04 (×4): 1 via ORAL
  Filled 2017-10-29 (×4): qty 1

## 2017-10-29 MED ORDER — SODIUM CHLORIDE 0.9 % IV SOLN
INTRAVENOUS | Status: DC
  Administered 2017-10-29: 12:00:00 via INTRAVENOUS

## 2017-10-29 MED ORDER — ASPIRIN 81 MG PO CHEW
CHEWABLE_TABLET | ORAL | Status: AC
  Filled 2017-10-29: qty 1

## 2017-10-29 MED ORDER — CARVEDILOL 3.125 MG PO TABS
3.1250 mg | ORAL_TABLET | Freq: Two times a day (BID) | ORAL | Status: DC
  Administered 2017-10-29: 3.125 mg via ORAL
  Filled 2017-10-29: qty 1

## 2017-10-29 MED ORDER — SODIUM CHLORIDE 0.9 % IV SOLN: 250.0000 mL | INTRAVENOUS | Status: DC | PRN

## 2017-10-29 MED ORDER — SODIUM CHLORIDE 0.9 % IV SOLN
3.0000 g | Freq: Two times a day (BID) | INTRAVENOUS | Status: DC
  Administered 2017-10-29: 3 g via INTRAVENOUS
  Filled 2017-10-29 (×3): qty 3

## 2017-10-29 MED ORDER — PANTOPRAZOLE SODIUM 40 MG IV SOLR
40.0000 mg | Freq: Two times a day (BID) | INTRAVENOUS | Status: DC
  Administered 2017-10-29: 40 mg via INTRAVENOUS
  Filled 2017-10-29: qty 40

## 2017-10-29 MED ORDER — SODIUM CHLORIDE 0.9% FLUSH: 3.0000 mL | Freq: Two times a day (BID) | INTRAVENOUS | Status: DC

## 2017-10-29 MED ORDER — ONDANSETRON HCL 4 MG/2ML IJ SOLN: 4.0000 mg | Freq: Four times a day (QID) | INTRAMUSCULAR | Status: DC | PRN

## 2017-10-29 MED ORDER — SPIRONOLACTONE 25 MG PO TABS
12.5000 mg | ORAL_TABLET | Freq: Every day | ORAL | Status: DC
  Administered 2017-10-29: 12.5 mg via ORAL
  Filled 2017-10-29: qty 0.5

## 2017-10-29 MED ORDER — ACETAMINOPHEN 325 MG PO TABS: 650.0000 mg | ORAL_TABLET | ORAL | Status: DC | PRN

## 2017-10-29 MED ORDER — SODIUM CHLORIDE 0.9 % WEIGHT BASED INFUSION
1.0000 mL/kg/h | INTRAVENOUS | Status: DC
  Administered 2017-10-29: 1 mL/kg/h via INTRAVENOUS

## 2017-10-29 MED ORDER — HEPARIN (PORCINE) IN NACL 1000-0.9 UT/500ML-% IV SOLN
INTRAVENOUS | Status: AC
  Filled 2017-10-29: qty 500

## 2017-10-29 MED ORDER — SACUBITRIL-VALSARTAN 24-26 MG PO TABS
1.0000 | ORAL_TABLET | Freq: Two times a day (BID) | ORAL | Status: DC
  Administered 2017-10-29: 1 via ORAL
  Filled 2017-10-29: qty 1

## 2017-10-29 SURGICAL SUPPLY — 10 items
CATH INFINITI 5FR ANG PIGTAIL (CATHETERS) ×4 IMPLANT
CATH INFINITI 5FR JL4 (CATHETERS) ×4 IMPLANT
CATH INFINITI JR4 5F (CATHETERS) ×4 IMPLANT
DEVICE CLOSURE MYNXGRIP 5F (Vascular Products) ×4 IMPLANT
DEVICE SAFEGUARD 24CM (GAUZE/BANDAGES/DRESSINGS) ×4 IMPLANT
KIT MANI 3VAL PERCEP (MISCELLANEOUS) ×4 IMPLANT
NEEDLE PERC 18GX7CM (NEEDLE) ×4 IMPLANT
PACK CARDIAC CATH (CUSTOM PROCEDURE TRAY) ×4 IMPLANT
SHEATH AVANTI 5FR X 11CM (SHEATH) ×4 IMPLANT
WIRE GUIDERIGHT .035X150 (WIRE) ×4 IMPLANT

## 2017-10-29 NOTE — Progress Notes (Signed)
SUBJECTIVE: no chest pain   Vitals:   10/29/17 1330 10/29/17 1400 10/29/17 1410 10/29/17 1439  BP: 138/90 (!) 141/89 135/84 135/87  Pulse: (!) 104 (!) 105 97 (!) 102  Resp: (!) 21 (!) 21 (!) 22 (!) 21  Temp:    98.2 F (36.8 C)  TempSrc:    Oral  SpO2: 97% 98% 97% 96%  Weight:      Height:        Intake/Output Summary (Last 24 hours) at 10/29/2017 1505 Last data filed at 10/29/2017 1100 Gross per 24 hour  Intake 1734.58 ml  Output 775 ml  Net 959.58 ml    LABS: Basic Metabolic Panel: Recent Labs    10/28/17 1504 10/29/17 0359  NA 138 140  K 3.5 3.8  CL 99* 104  CO2 25 25  GLUCOSE 310* 228*  BUN 25* 27*  CREATININE 0.87 1.11*  CALCIUM 9.2 8.6*   Liver Function Tests: Recent Labs    10/28/17 1504  AST 31  ALT 17  ALKPHOS 48  BILITOT 0.8  PROT 7.5  ALBUMIN 4.3   No results for input(s): LIPASE, AMYLASE in the last 72 hours. CBC: Recent Labs    10/28/17 1504 10/29/17 0359  WBC 26.1* 21.2*  NEUTROABS 21.4*  --   HGB 14.4 13.2  HCT 45.9 41.6  MCV 86.1 85.8  PLT 698* 454*   Cardiac Enzymes: Recent Labs    10/28/17 2227 10/29/17 0359 10/29/17 1008  TROPONINI 4.02* 6.88* 6.25*   BNP: Invalid input(s): POCBNP D-Dimer: No results for input(s): DDIMER in the last 72 hours. Hemoglobin A1C: No results for input(s): HGBA1C in the last 72 hours. Fasting Lipid Panel: No results for input(s): CHOL, HDL, LDLCALC, TRIG, CHOLHDL, LDLDIRECT in the last 72 hours. Thyroid Function Tests: No results for input(s): TSH, T4TOTAL, T3FREE, THYROIDAB in the last 72 hours.  Invalid input(s): FREET3 Anemia Panel: No results for input(s): VITAMINB12, FOLATE, FERRITIN, TIBC, IRON, RETICCTPCT in the last 72 hours.   PHYSICAL EXAM General: Well developed, well nourished, in no acute distress HEENT:  Normocephalic and atramatic Neck:  No JVD.  Lungs: Clear bilaterally to auscultation and percussion. Heart: HRRR . Normal S1 and S2 without gallops or murmurs.   Abdomen: Bowel sounds are positive, abdomen soft and non-tender  Msk:  Back normal, normal gait. Normal strength and tone for age. Extremities: No clubbing, cyanosis or edema.   Neuro: Alert and oriented X 3. Psych:  Good affect, responds appropriately  TELEMETRY: nsr  ASSESSMENT AND PLAN: Severe 3 vessel disease with severe LV systolic dysfunction, advised CABG, and was to transfer to [place]. [facility] called me just now and said Dr. Dwyane Luo CT surgeon turned down CABG. Attempted to contact [facility] surgery and hhad leave message and will send films. Have already Dr. Clayborn Bigness look at for PCI and is high risk.  Active Problems:   Hypoxia    Dionisio David, MD, [facility] 10/29/2017 3:05 PM

## 2017-10-29 NOTE — Progress Notes (Signed)
[facility] NAME: [name]    MR#:  [id number]  DATE OF BIRTH:  03/15/1932  SUBJECTIVE:  CHIEF COMPLAINT:   Chief Complaint  Patient presents with  . Altered Mental Status   -Seen in ICU this morning.  Patient brought in for unresponsive episode and was noted to be hypoxic.  Aspiration was considered and she needed BiPAP initially -On 1 L oxygen this morning.  Dry mouth and slight left facial droop noted -CT showing pericardial effusion.  Cardiology recommending cardiac cath -Niece at bedside  REVIEW OF SYSTEMS:  Review of Systems  Constitutional: Positive for malaise/fatigue. Negative for chills and fever.  HENT: Negative for congestion, ear discharge, hearing loss and nosebleeds.   Eyes: Negative for blurred vision and double vision.  Respiratory: Positive for shortness of breath. Negative for cough and wheezing.   Cardiovascular: Negative for chest pain, palpitations and leg swelling.  Gastrointestinal: Positive for abdominal pain and nausea. Negative for constipation, diarrhea and vomiting.  Genitourinary: Negative for dysuria.  Musculoskeletal: Negative for myalgias.  Neurological: Negative for dizziness, focal weakness, seizures, weakness and headaches.  Psychiatric/Behavioral: Negative for depression.    DRUG ALLERGIES:   Allergies  Allergen Reactions  . Simvastatin     Other reaction(s): Muscle Pain    VITALS:  Blood pressure 135/84, pulse 97, temperature 98.1 F (36.7 C), resp. rate (!) 22, height 5\' 1"  (1.549 m), weight 56.7 kg (125 lb), SpO2 97 %.  PHYSICAL EXAMINATION:  Physical Exam  GENERAL:  84 y.o.-year-old elderly patient lying in the bed with no acute distress.  EYES: Pupils equal, round, reactive to light and accommodation. No scleral icterus. Extraocular muscles intact.  HEENT: Head atraumatic, normocephalic. Oropharynx and nasopharynx clear.  NECK:  Supple, no jugular venous distention. No  thyroid enlargement, no tenderness.  LUNGS: Normal breath sounds bilaterally, no wheezing, rales,rhonchi or crepitation. No use of accessory muscles of respiration.  Decreased bibasilar breath sounds CARDIOVASCULAR: S1, S2 normal. No rubs, or gallops.  3/6 systolic murmur is present ABDOMEN: Soft, nontender, nondistended. Bowel sounds present. No organomegaly or mass.  EXTREMITIES: No pedal edema, cyanosis, or clubbing.  NEUROLOGIC: Cranial nerves II through XII are intact.  Except slight left facial droop noted . muscle strength 5/5 in all extremities. Sensation intact. Gait not checked.  Global weakness noted PSYCHIATRIC: The patient is alert and oriented x 3.  SKIN: No obvious rash, lesion, or ulcer.    LABORATORY PANEL:   CBC Recent Labs  Lab 10/29/17 0359  WBC 21.2*  HGB 13.2  HCT 41.6  PLT 454*   ------------------------------------------------------------------------------------------------------------------  Chemistries  Recent Labs  Lab 10/28/17 1504 10/29/17 0359  NA 138 140  K 3.5 3.8  CL 99* 104  CO2 25 25  GLUCOSE 310* 228*  BUN 25* 27*  CREATININE 0.87 1.11*  CALCIUM 9.2 8.6*  AST 31  --   ALT 17  --   ALKPHOS 48  --   BILITOT 0.8  --    ------------------------------------------------------------------------------------------------------------------  Cardiac Enzymes Recent Labs  Lab 10/29/17 1008  TROPONINI 6.25*   ------------------------------------------------------------------------------------------------------------------  RADIOLOGY:  Dg Chest 1 View  Result Date: 10/28/2017 CLINICAL DATA:  Lethargy and weakness. EXAM: CHEST  1 VIEW COMPARISON:  Chest x-rays dated 03/23/2017 and 11/10/2012. FINDINGS: Cardiomegaly is stable. Atherosclerotic changes noted at the aortic arch. Bilateral perihilar opacities, presumed edema, LEFT greater than RIGHT. No pleural effusion or pneumothorax seen. Osseous structures about the chest are unremarkable.  IMPRESSION: 1. Cardiomegaly with new perihilar opacities, presumably pulmonary edema, suggesting CHF/volume overload. Pneumonia is considered less likely. 2. Aortic atherosclerosis. Electronically Signed   By: Franki Cabot M.D.   On: 10/28/2017 15:48   Ct Angio Chest Pe W And/or Wo Contrast  Result Date: 10/28/2017 CLINICAL DATA:  Patient presents to ED via [facility] from home (patient lives alone). Patients caregiver found her today in bed, alert and oriented but very lethargic and weak. Upon EMS arrival SpO2 73% on RA. EXAM: CT ANGIOGRAPHY CHEST WITH CONTRAST TECHNIQUE: Multidetector CT imaging of the chest was performed using the standard protocol during bolus administration of intravenous contrast. Multiplanar CT image reconstructions and MIPs were obtained to evaluate the vascular anatomy. CONTRAST:  30mL ISOVUE-370 IOPAMIDOL (ISOVUE-370) INJECTION 76% COMPARISON:  Chest x-ray 10/28/2017 FINDINGS: Cardiovascular: There is small pericardial effusion measuring maximum thickness of 1.0 centimeters. The heart is enlarged. There is significant coronary artery calcification. There is atherosclerotic calcification of the thoracic aorta thoracic aorta measures 3.1 centimeters. Descending aorta is 3.8 centimeters and is stable in appearance. No displaced intimal calcifications or evidence for dissection. Mediastinum/Nodes: The visualized portion of the thyroid gland has a normal appearance. Esophagus is normal in appearance. No significant adenopathy. Lungs/Pleura: There are small bilateral pleural effusions. There is a stable irregular density at the LEFT lung apex, measuring 8 millimeters in diameter. There is smooth septal wall thickening. There are patchy areas of ground-glass density, primarily with a dependent distribution. No suspicious pulmonary nodules. No focal areas of consolidation. Upper Abdomen: There is atherosclerotic calcification of the abdominal aorta. Musculoskeletal: There are numerous compression  fractures of the thoracic spine, stable in appearance. Review of the MIP images confirms the above findings. IMPRESSION: 1. Pericardial effusion and cardiomegaly.  Coronary artery disease. 2. Parenchymal changes consistent with pulmonary edema and small pleural effusions. 3. 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 Recommend annual imaging followup by CTA or MRA. This recommendation follows 2010 ACCF/AHA/AATS/ACR/ASA/SCA/SCAI/SIR/STS/SVM Guidelines for the Diagnosis and Management of Patients with Thoracic Aortic Disease. Circulation.2010; 121: W098-J191 4. Aortic aneurysm NOS (ICD10-I71.9). Aortic Atherosclerosis (ICD10-I70.0). 5. Stable probable scar at the LEFT lung apex. 6. Numerous thoracic compression fractures. Electronically Signed   By: Nolon Nations M.D.   On: 10/28/2017 17:32    EKG:   Orders placed or performed during the hospital encounter of 10/28/17  . ED EKG  . ED EKG    ASSESSMENT AND PLAN:   84 year old female with past medical history significant for cervical/endometrial cancer, diabetes, polycythemia vera, history of CVA/TIA who stays at home by herself is brought in secondary to confusion and lethargy.  1. NSTEMI-appreciate cardiology input.  Patient admitted to ICU -Started on heparin drip -Denies any active chest pain.  For cardiac catheterization this afternoon. -Continue aspirin, add beta blocker and statin  2.  Acute hypoxic respiratory failure-concern for aspiration pneumonia during unresponsive episode -Follow-up cultures.  Currently on Zosyn -Weaned off BiPAP.  On 1 to 2 L supplemental oxygen which is acute  3.  Hematemesis-appreciate GI consult.  On Protonix.  No plans for any endoscopy given her acute cardiac and respiratory condition  4.  History of TIA/CVA-with left facial droop which could be chronic according to the Niece.  CT head ordered.  Will get the CT head prior to starting heparin  drip.  5.  Pericardial effusion noted on CT chest-also elevated BNP.  Echocardiogram pending -Continue IV Lasix  6.  DVT prophylaxis- heparin drip will be  started   All the records are reviewed and case discussed with Care Management/Social Workerr. Management plans discussed with the patient, family and they are in agreement.  CODE STATUS: Full Code  TOTAL TIME TAKING CARE OF THIS PATIENT: 38 minutes.   POSSIBLE D/C IN 1-2 DAYS, DEPENDING ON CLINICAL CONDITION.   Gladstone Lighter M.D on 10/29/2017 at 2:29 PM  Between 7am to 6pm - Pager - [phone]  After 6pm go to [email or web address] - password [email or web address] [facility]  Office  [phone]  CC: Primary care physician; Baxter Hire, MD

## 2017-10-29 NOTE — Progress Notes (Signed)
Called [facility], to transfer patient for CABG at [facility]. Waiting to hear from [place]. Patient has severe LV systolic dysfunction and 3 vessel disease.

## 2017-10-29 NOTE — Discharge Summary (Addendum)
Patient ID: [name], female   DOB: [birth year], 84 y.o.   MRN: [id number]  Pulmonary/critical care attending   [name] is an 84 yo female with a PMH of Stroke, Recurrent Falls, Polycythemia Vera, Microalbuminuria, HTN, TIA, Endometrial Carcinoma, Diabetes Mellitus, Colon Cancer, Breast Cancer, Arthritis, and Adenocarcinoma in Situ of Cervix. She presented to [facility] ER via EMS on 05/9 with AMS.  Per ER notes the pt reported to her caregiver she did not feel well, vomited a large amount of black emesis and briefly became unresponsive caregiver concerned pt likely aspirated. Upon EMS arrival pts O2 sats on RA were in the 70's, therefore she was placed on NRB en route to the ER.  In the ER pt placed on Bipap due to respiratory distress and hypoxia.  CTA Chest revealed pericardial effusion, small pleural effusions, and pulmonary edema.  She received iv abx and lasix.  She was subsequently admitted to the stepdown unit by hospitalist team for further workup and treatment.  She was successfully weaned off BiPAP, was awake, alert and communicating this morning. Had been diuresed. Pertinent labs revealed an elevated troponin of 6.25. Other pertinent labs revealed a BUNs 27, creatinine 1.11, white count 21.2, lactic acid of 2.2. Hg 13.2. She was started on empiric broad-spectrum antibiotics to include vancomycin and Unasyn, she was also started on a Protonix for hematemesis. CT scan of the head was also ordered secondary to prior history of TIA/CVA. She underwent cardiac catheterization which revealed triple-vessel disease with severe LV dysfunction. Prior to getting heparin started CT scan of the head was performed and revealed a right cerebellar hemorrhage with surrounding edema.she is being started on our hypertonic saline protocol for cerebral edema. Neurologic consulted who recommended transfer to [facility] with both neurology, neuro ICU and neurosurgery available. Discussed with patient and family who is  agreeable. Patient is presently hemodynamically stable, oxygenating well, awake and alert.  Echocardiogram revealed LV function at 25%   Cardiac catheterization revealed Ost Cx to Prox Cx lesion is 80% stenosed with 90% stenosed side branch in Ost 1st Mrg to 1st Mrg.  Mid LAD lesion is 95% stenosed.  Mid RCA lesion is 75% stenosed.  Ost LAD to Prox LAD lesion is 70% stenosed.   Severe 3 vessel disease with severe LV systolic dysfunction and anteroapical aneurysm. Advise CABG at [facility].   Accepting physician at [facility] is Dr. Kandice Robinsons  NeuroICU.   [name], D.O.

## 2017-10-29 NOTE — Progress Notes (Signed)
Spoke to Dr Terrence Dupont and will try to stabilize her with CHF meds and once mre stable will do high risk PCI at [facility] next week. Added coreg/entresto and aldactone and may need digoxin.

## 2017-10-29 NOTE — Progress Notes (Signed)
She is high risk for CABG and PCI. Discussed with Dr. Charolette Forward for High risk PCI/Stenting with [facility]ella. He may look at films and transfer.

## 2017-10-29 NOTE — Progress Notes (Addendum)
Patient ID: [name], female   DOB: 11/12/1931, 84 y.o.   MRN: [id number]  Pulmonary/critical care attending  Patient is status post cardiac catheterization.  Upon return went for CT scan of her head which reveals a right cerebellar bleed with some surrounding edema and fourth ventricle extension.  Notified Dr. Chancy Milroy. Heparin not started.  Neurology onsulting neurology. Started on Hypertonic Na protocol.  Working on transfer to [facility]. Informed patient and family  [name], DO

## 2017-10-29 NOTE — Progress Notes (Signed)
Called [facility] [place] ICU to give report- accepting RN stated she did not have any more questions from the report that I gave [facility].

## 2017-10-29 NOTE — Consult Note (Signed)
[name] is a 84 y.o. female  [id number]  Primary Cardiologist: New pt to Dr. Neoma Laming Reason for Consultation: Elevated troponin, pericardial effusion   HPI: 84yo female with known medical history of stroke, TIA, diabetes mellitus, breast cancer, colon cancer, and endometrial carcinoma presented to the ED with altered mental status. EMS was called to the patients home by caregiver. Patient was noted to have difficulty breathing and and oxygen saturations in the 70s. She denies chest pain, fever, or chills but has vomiting and diarrhea.    Review of Systems: Resting comfortably, denies chest pain or shortness of breath.     Past Medical History:  Diagnosis Date  . Adenocarcinoma in situ of cervix   . Arthritis    hands  . Breast cancer ([place])   . Colon cancer ([place])   . Diabetes mellitus without complication ([place])   . Endometrial carcinoma ([facility])    s/p total abdominal hysterectomy  . H/O compression fracture of spine 2014   thoracic spine  . H/O polycythemia vera   . H/O TIA (transient ischemic attack) and stroke 09/2014, 03/2015   No deficits  . Hypertension   . Hyperuricemia   . Microalbuminuria   . Polycythemia vera ([place])   . Recurrent falls   . Skin cancer    face, legs  . Stroke [facility]) 2008   no deficits  . Trochanteric bursitis   . Varicose veins    treated    Medications Prior to Admission  Medication Sig Dispense Refill  . alendronate (FOSAMAX) 70 MG tablet Take 70 mg by mouth once a week. Take with a full glass of water on an empty stomach.    Marland Kitchen aspirin EC 81 MG tablet Take 81 mg by mouth daily.    . calcium-vitamin D (OSCAL WITH D) 500-200 MG-UNIT per tablet Take 1 tablet by mouth daily.     . carvedilol (COREG) 6.25 MG tablet Take 6.25 mg by mouth 2 (two) times daily with a meal.    . chlorthalidone (HYGROTON) 25 MG tablet Take 25 mg by mouth daily.    . clopidogrel (PLAVIX) 75 MG tablet Take 1 tablet (75 mg total) by mouth daily. 30 tablet 0  .  docusate sodium (COLACE) 250 MG capsule Take 250 mg by mouth daily as needed for constipation.    . fexofenadine (ALLEGRA) 180 MG tablet Take 180 mg by mouth daily as needed.     . hydroxyurea (HYDREA) 500 MG capsule TAKE 1 CAPSULE BY MOUTH ON MONDAY WEDNESDAY FRIDAY AND Saturday. Take  1 capsule every other Tuesday WITH FOOD (Patient taking differently: 500 mg. TAKE 1 CAPSULE BY MOUTH ON Sunday through Friday.  Do no take on Saturday) 54 capsule 1  . loperamide (IMODIUM) 2 MG capsule Take 4 mg by mouth as needed for diarrhea or loose stools.    . metFORMIN (GLUCOPHAGE) 1000 MG tablet Take 1,000 mg by mouth 2 (two) times daily.     . pioglitazone (ACTOS) 15 MG tablet Take 15 mg by mouth daily.    . polyethylene glycol (MIRALAX / GLYCOLAX) packet Take 17 g by mouth daily as needed.    . potassium chloride (K-DUR,KLOR-CON) 10 MEQ tablet Take 10 mEq by mouth daily.    . pravastatin (PRAVACHOL) 20 MG tablet Take 20 mg by mouth at bedtime.     . Probiotic Product (PROBIOTIC DAILY PO) Take 1 tablet by mouth daily.    . ramipril (ALTACE) 10 MG capsule Take 10  mg by mouth 2 (two) times daily.     . verapamil (CALAN-SR) 240 MG CR tablet Take 240 mg by mouth daily.       . furosemide  60 mg Intravenous BID  . sodium chloride flush  3 mL Intravenous Q12H    Infusions: . sodium chloride    . pantoprozole (PROTONIX) infusion 8 mg/hr (10/29/17 0700)  . vancomycin      Allergies  Allergen Reactions  . Simvastatin     Other reaction(s): Muscle Pain    Social History   Socioeconomic History  . Marital status: Widowed    Spouse name: Not on file  . Number of children: Not on file  . Years of education: Not on file  . Highest education level: Not on file  Occupational History  . Not on file  Social Needs  . Financial resource strain: Not on file  . Food insecurity:    Worry: Not on file    Inability: Not on file  . Transportation needs:    Medical: Not on file    Non-medical: Not on file   Tobacco Use  . Smoking status: Former Research scientist (life sciences)  . Smokeless tobacco: Never Used  . Tobacco comment: quit 30+ yrs ago  Substance and Sexual Activity  . Alcohol use: No  . Drug use: No  . Sexual activity: Not on file  Lifestyle  . Physical activity:    Days per week: Not on file    Minutes per session: Not on file  . Stress: Not on file  Relationships  . Social connections:    Talks on phone: Not on file    Gets together: Not on file    Attends religious service: Not on file    Active member of club or organization: Not on file    Attends meetings of clubs or organizations: Not on file    Relationship status: Not on file  . Intimate partner violence:    Fear of current or ex partner: Not on file    Emotionally abused: Not on file    Physically abused: Not on file    Forced sexual activity: Not on file  Other Topics Concern  . Not on file  Social History Narrative  . Not on file    Family History  Problem Relation Age of Onset  . Cancer Brother        AML  . Heart attack Mother   . Breast cancer Mother   . Heart attack Father   . Heart attack Sister   . Diabetes Sister     PHYSICAL EXAM: Vitals:   10/29/17 0800 10/29/17 0823  BP: 139/88   Pulse: 98 (!) 105  Resp: 19 (!) 28  Temp:  98.1 F (36.7 C)  SpO2: 94% 98%     Intake/Output Summary (Last 24 hours) at 10/29/2017 0843 Last data filed at 10/29/2017 0700 Gross per 24 hour  Intake 1634.58 ml  Output 75 ml  Net 1559.58 ml    General:  Well appearing. No respiratory difficulty HEENT: normal Neck: supple. no JVD. Carotids 2+ bilat; no bruits. No lymphadenopathy or thryomegaly appreciated. Cor: PMI nondisplaced. Regular rate & rhythm. No rubs, gallops or murmurs. Lungs: clear Abdomen: soft, nontender, nondistended. No hepatosplenomegaly. No bruits or masses. Good bowel sounds. Extremities: no cyanosis, clubbing, rash, edema Neuro: alert & oriented x 3, cranial nerves grossly intact. moves all 4  extremities w/o difficulty. Affect pleasant.  ECG: Sinus tachycardia 117bpm, old anterior MI, left anterior  fascicular block  Results for orders placed or performed during the hospital encounter of 10/28/17 (from the past 24 hour(s))  CBC with Differential     Status: Abnormal   Collection Time: 10/28/17  3:04 PM  Result Value Ref Range   WBC 26.1 (H) 3.6 - 11.0 K/uL   RBC 5.33 (H) 3.80 - 5.20 MIL/uL   Hemoglobin 14.4 12.0 - 16.0 g/dL   HCT 45.9 35.0 - 47.0 %   MCV 86.1 80.0 - 100.0 fL   MCH 27.0 26.0 - 34.0 pg   MCHC 31.3 (L) 32.0 - 36.0 g/dL   RDW 19.9 (H) 11.5 - 14.5 %   Platelets 698 (H) 150 - 440 K/uL   Neutrophils Relative % 80 %   Lymphocytes Relative 9 %   Monocytes Relative 3 %   Eosinophils Relative 1 %   Basophils Relative 4 %   Band Neutrophils 2 %   Metamyelocytes Relative 0 %   Myelocytes 0 %   Promyelocytes Relative 0 %   Blasts 0 %   nRBC 0 0 /100 WBC   Other 1 %   Neutro Abs 21.4 (H) 1.4 - 6.5 K/uL   Lymphs Abs 2.3 1.0 - 3.6 K/uL   Monocytes Absolute 0.8 0.2 - 0.9 K/uL   Eosinophils Absolute 0.3 0 - 0.7 K/uL   Basophils Absolute 1.0 (H) 0 - 0.1 K/uL   RBC Morphology MIXED RBC POPULATION   Comprehensive metabolic panel     Status: Abnormal   Collection Time: 10/28/17  3:04 PM  Result Value Ref Range   Sodium 138 135 - 145 mmol/L   Potassium 3.5 3.5 - 5.1 mmol/L   Chloride 99 (L) 101 - 111 mmol/L   CO2 25 22 - 32 mmol/L   Glucose, Bld 310 (H) 65 - 99 mg/dL   BUN 25 (H) 6 - 20 mg/dL   Creatinine, Ser 0.87 0.44 - 1.00 mg/dL   Calcium 9.2 8.9 - 10.3 mg/dL   Total Protein 7.5 6.5 - 8.1 g/dL   Albumin 4.3 3.5 - 5.0 g/dL   AST 31 15 - 41 U/L   ALT 17 14 - 54 U/L   Alkaline Phosphatase 48 38 - 126 U/L   Total Bilirubin 0.8 0.3 - 1.2 mg/dL   GFR calc non Af Amer 60 (L) >60 mL/min   GFR calc Af Amer >60 >60 mL/min   Anion gap 14 5 - 15  Troponin I     Status: Abnormal   Collection Time: 10/28/17  3:04 PM  Result Value Ref Range   Troponin I 1.02 (HH)  <0.03 ng/mL  Lactic acid, plasma     Status: Abnormal   Collection Time: 10/28/17  3:04 PM  Result Value Ref Range   Lactic Acid, Venous 3.1 (HH) 0.5 - 1.9 mmol/L  Blood culture (routine x 2)     Status: None (Preliminary result)   Collection Time: 10/28/17  3:04 PM  Result Value Ref Range   Specimen Description      BLOOD RIGHT HAND Performed at [facility], [address], [place], [place] [address]    Special Requests      BOTTLES DRAWN AEROBIC AND ANAEROBIC Blood Culture results may not be optimal due to an excessive volume of blood received in culture bottles   Culture      NO GROWTH < 24 HOURS Performed at [facility], [address] [place], [place] [address]    Report Status PENDING  Blood culture (routine x 2)     Status: None (Preliminary result)   Collection Time: 10/28/17  3:04 PM  Result Value Ref Range   Specimen Description BLOOD RIGHT AC    Special Requests      BOTTLES DRAWN AEROBIC AND ANAEROBIC Blood Culture adequate volume   Culture      NO GROWTH < 24 HOURS Performed at [facility], [address] [place], [place] [address]    Report Status PENDING   Blood gas, venous     Status: None (Preliminary result)   Collection Time: 10/28/17  4:04 PM  Result Value Ref Range   FIO2 PENDING    Delivery systems PENDING    pH, Ven 7.31 7.250 - 7.430   pCO2, Ven 51 44.0 - 60.0 mmHg   pO2, Ven 39.0 32.0 - 45.0 mmHg   Bicarbonate 25.7 20.0 - 28.0 mmol/L   Acid-base deficit 1.3 0.0 - 2.0 mmol/L   O2 Saturation 67.7 %   Patient temperature 37.0    Collection site VEIN    Sample type VEIN   Lactic acid, plasma     Status: Abnormal   Collection Time: 10/28/17  5:59 PM  Result Value Ref Range   Lactic Acid, Venous 3.0 (HH) 0.5 - 1.9 mmol/L  Urinalysis, Complete w Microscopic     Status: Abnormal   Collection Time: 10/28/17  5:59 PM  Result Value Ref Range   Color, Urine YELLOW (A) YELLOW   APPearance CLEAR (A) CLEAR   Specific  Gravity, Urine 1.012 1.005 - 1.030   pH 6.0 5.0 - 8.0   Glucose, UA >=500 (A) NEGATIVE mg/dL   Hgb urine dipstick NEGATIVE NEGATIVE   Bilirubin Urine NEGATIVE NEGATIVE   Ketones, ur 20 (A) NEGATIVE mg/dL   Protein, ur 100 (A) NEGATIVE mg/dL   Nitrite NEGATIVE NEGATIVE   Leukocytes, UA NEGATIVE NEGATIVE   RBC / HPF 0-5 0 - 5 RBC/hpf   WBC, UA 0-5 0 - 5 WBC/hpf   Bacteria, UA NONE SEEN NONE SEEN   Squamous Epithelial / LPF 0-5 0 - 5   Mucus PRESENT   Brain natriuretic peptide     Status: Abnormal   Collection Time: 10/28/17  5:59 PM  Result Value Ref Range   B Natriuretic Peptide 1,208.0 (H) 0.0 - 100.0 pg/mL  MRSA PCR Screening     Status: None   Collection Time: 10/28/17  9:58 PM  Result Value Ref Range   MRSA by PCR NEGATIVE NEGATIVE  Lactic acid, plasma     Status: Abnormal   Collection Time: 10/28/17 10:27 PM  Result Value Ref Range   Lactic Acid, Venous 2.2 (HH) 0.5 - 1.9 mmol/L  Troponin I     Status: Abnormal   Collection Time: 10/28/17 10:27 PM  Result Value Ref Range   Troponin I 4.02 (HH) <0.03 ng/mL  Basic metabolic panel     Status: Abnormal   Collection Time: 10/29/17  3:59 AM  Result Value Ref Range   Sodium 140 135 - 145 mmol/L   Potassium 3.8 3.5 - 5.1 mmol/L   Chloride 104 101 - 111 mmol/L   CO2 25 22 - 32 mmol/L   Glucose, Bld 228 (H) 65 - 99 mg/dL   BUN 27 (H) 6 - 20 mg/dL   Creatinine, Ser 1.11 (H) 0.44 - 1.00 mg/dL   Calcium 8.6 (L) 8.9 - 10.3 mg/dL   GFR calc non Af Amer 45 (L) >60 mL/min   GFR calc Af  Amer 52 (L) >60 mL/min   Anion gap 11 5 - 15  CBC     Status: Abnormal   Collection Time: 10/29/17  3:59 AM  Result Value Ref Range   WBC 21.2 (H) 3.6 - 11.0 K/uL   RBC 4.85 3.80 - 5.20 MIL/uL   Hemoglobin 13.2 12.0 - 16.0 g/dL   HCT 41.6 35.0 - 47.0 %   MCV 85.8 80.0 - 100.0 fL   MCH 27.2 26.0 - 34.0 pg   MCHC 31.7 (L) 32.0 - 36.0 g/dL   RDW 19.7 (H) 11.5 - 14.5 %   Platelets 454 (H) 150 - 440 K/uL  Troponin I     Status: Abnormal    Collection Time: 10/29/17  3:59 AM  Result Value Ref Range   Troponin I 6.88 (HH) <0.03 ng/mL  Protime-INR     Status: None   Collection Time: 10/29/17  3:59 AM  Result Value Ref Range   Prothrombin Time 13.7 11.4 - 15.2 seconds   INR 1.06    Dg Chest 1 View  Result Date: 10/28/2017 CLINICAL DATA:  Lethargy and weakness. EXAM: CHEST  1 VIEW COMPARISON:  Chest x-rays dated 03/23/2017 and 11/10/2012. FINDINGS: Cardiomegaly is stable. Atherosclerotic changes noted at the aortic arch. Bilateral perihilar opacities, presumed edema, LEFT greater than RIGHT. No pleural effusion or pneumothorax seen. Osseous structures about the chest are unremarkable. IMPRESSION: 1. Cardiomegaly with new perihilar opacities, presumably pulmonary edema, suggesting CHF/volume overload. Pneumonia is considered less likely. 2. Aortic atherosclerosis. Electronically Signed   By: Franki Cabot M.D.   On: 10/28/2017 15:48   Ct Angio Chest Pe W And/or Wo Contrast  Result Date: 10/28/2017 CLINICAL DATA:  Patient presents to ED via [facility] from home (patient lives alone). Patients caregiver found her today in bed, alert and oriented but very lethargic and weak. Upon EMS arrival SpO2 73% on RA. EXAM: CT ANGIOGRAPHY CHEST WITH CONTRAST TECHNIQUE: Multidetector CT imaging of the chest was performed using the standard protocol during bolus administration of intravenous contrast. Multiplanar CT image reconstructions and MIPs were obtained to evaluate the vascular anatomy. CONTRAST:  21mL ISOVUE-370 IOPAMIDOL (ISOVUE-370) INJECTION 76% COMPARISON:  Chest x-ray 10/28/2017 FINDINGS: Cardiovascular: There is small pericardial effusion measuring maximum thickness of 1.0 centimeters. The heart is enlarged. There is significant coronary artery calcification. There is atherosclerotic calcification of the thoracic aorta thoracic aorta measures 3.1 centimeters. Descending aorta is 3.8 centimeters and is stable in appearance. No displaced intimal  calcifications or evidence for dissection. Mediastinum/Nodes: The visualized portion of the thyroid gland has a normal appearance. Esophagus is normal in appearance. No significant adenopathy. Lungs/Pleura: There are small bilateral pleural effusions. There is a stable irregular density at the LEFT lung apex, measuring 8 millimeters in diameter. There is smooth septal wall thickening. There are patchy areas of ground-glass density, primarily with a dependent distribution. No suspicious pulmonary nodules. No focal areas of consolidation. Upper Abdomen: There is atherosclerotic calcification of the abdominal aorta. Musculoskeletal: There are numerous compression fractures of the thoracic spine, stable in appearance. Review of the MIP images confirms the above findings. IMPRESSION: 1. Pericardial effusion and cardiomegaly.  Coronary artery disease. 2. Parenchymal changes consistent with pulmonary edema and small pleural effusions. 3. 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 Recommend annual imaging followup by CTA or MRA. This recommendation follows 2010 ACCF/AHA/AATS/ACR/ASA/SCA/SCAI/SIR/STS/SVM Guidelines for the Diagnosis and Management of Patients with Thoracic Aortic Disease. Circulation.2010; 121: J242-A834 4. Aortic aneurysm NOS (ICD10-I71.9). Aortic  Atherosclerosis (ICD10-I70.0). 5. Stable probable scar at the LEFT lung apex. 6. Numerous thoracic compression fractures. Electronically Signed   By: Nolon Nations M.D.   On: 10/28/2017 17:32     ASSESSMENT AND PLAN: Hypoxemic respiratory failure secondary to acute on chronic congestive heart failure and noted to have atherosclerotic calcification of aorta on CT chest with small pericardial effusion. Troponin elevated to 6.88. Respiratory status stable on 1L nasal canula,  Echo is ordered, pending.   Recommend cardiac cath today to evaluate for coronary artery disease. Will place order and  schedule.   Jake Bathe, NP-C Cell: [phone]

## 2017-10-29 NOTE — Progress Notes (Signed)
Follow up - Critical Care Medicine Note  Patient Details:    [name] is an 84 y.o. female. admitted with acute encephalopathy, hematemesis, pericardial effusion, and acute on chronic hypoxic respiratory failure secondary to pulmonary edema and possible aspiration pneumonia requiring Bipap   Anti-infectives:  Anti-infectives (From admission, onward)   Start     Dose/Rate Route Frequency Ordered Stop   10/29/17 1800  vancomycin (VANCOCIN) IVPB 1000 mg/200 mL premix     1,000 mg 200 mL/hr over 60 Minutes Intravenous Every 24 hours 10/28/17 2154     10/29/17 0200  piperacillin-tazobactam (ZOSYN) IVPB 3.375 g     3.375 g 12.5 mL/hr over 240 Minutes Intravenous Every 8 hours 10/28/17 2154     10/28/17 1600  vancomycin (VANCOCIN) IVPB 1000 mg/200 mL premix     1,000 mg 200 mL/hr over 60 Minutes Intravenous  Once 10/28/17 1552 10/28/17 1835   10/28/17 1500  piperacillin-tazobactam (ZOSYN) IVPB 3.375 g     3.375 g 100 mL/hr over 30 Minutes Intravenous  Once 10/28/17 1456 10/28/17 1744      Microbiology: Results for orders placed or performed during the hospital encounter of 10/28/17  Blood culture (routine x 2)     Status: None (Preliminary result)   Collection Time: 10/28/17  3:04 PM  Result Value Ref Range Status   Specimen Description   Final    BLOOD RIGHT HAND Performed at [facility], [place] [address] [place], [place] [address]    Special Requests   Final    BOTTLES DRAWN AEROBIC AND ANAEROBIC Blood Culture results may not be optimal due to an excessive volume of blood received in culture bottles   Culture   Final    NO GROWTH < 24 HOURS Performed at [facility], [address] [place], [place] [address]    Report Status PENDING  Incomplete  Blood culture (routine x 2)     Status: None (Preliminary result)   Collection Time: 10/28/17  3:04 PM  Result Value Ref Range Status   Specimen Description BLOOD RIGHT [facility]  Final   Special Requests   Final     BOTTLES DRAWN AEROBIC AND ANAEROBIC Blood Culture adequate volume   Culture   Final    NO GROWTH < 24 HOURS Performed at [facility], [address] [place], [place] [address]    Report Status PENDING  Incomplete  MRSA PCR Screening     Status: None   Collection Time: 10/28/17  9:58 PM  Result Value Ref Range Status   MRSA by PCR NEGATIVE NEGATIVE Final    Comment:        The GeneXpert MRSA Assay (FDA approved for NASAL specimens only), is one component of a comprehensive MRSA colonization surveillance program. It is not intended to diagnose MRSA infection nor to guide or monitor treatment for MRSA infections. Performed at [facility], [address] [place], [place] [address]     Studies: Dg Chest 1 View  Result Date: 10/28/2017 CLINICAL DATA:  Lethargy and weakness. EXAM: CHEST  1 VIEW COMPARISON:  Chest x-rays dated 03/23/2017 and 11/10/2012. FINDINGS: Cardiomegaly is stable. Atherosclerotic changes noted at the aortic arch. Bilateral perihilar opacities, presumed edema, LEFT greater than RIGHT. No pleural effusion or pneumothorax seen. Osseous structures about the chest are unremarkable. IMPRESSION: 1. Cardiomegaly with new perihilar opacities, presumably pulmonary edema, suggesting CHF/volume overload. Pneumonia is considered less likely. 2. Aortic atherosclerosis. Electronically Signed   By: Franki Cabot  M.D.   On: 10/28/2017 15:48   Ct Angio Chest Pe W And/or Wo Contrast  Result Date: 10/28/2017 CLINICAL DATA:  Patient presents to ED via [facility] from home (patient lives alone). Patients caregiver found her today in bed, alert and oriented but very lethargic and weak. Upon EMS arrival SpO2 73% on RA. EXAM: CT ANGIOGRAPHY CHEST WITH CONTRAST TECHNIQUE: Multidetector CT imaging of the chest was performed using the standard protocol during bolus administration of intravenous contrast. Multiplanar CT image reconstructions and MIPs were obtained to evaluate the  vascular anatomy. CONTRAST:  58mL ISOVUE-370 IOPAMIDOL (ISOVUE-370) INJECTION 76% COMPARISON:  Chest x-ray 10/28/2017 FINDINGS: Cardiovascular: There is small pericardial effusion measuring maximum thickness of 1.0 centimeters. The heart is enlarged. There is significant coronary artery calcification. There is atherosclerotic calcification of the thoracic aorta thoracic aorta measures 3.1 centimeters. Descending aorta is 3.8 centimeters and is stable in appearance. No displaced intimal calcifications or evidence for dissection. Mediastinum/Nodes: The visualized portion of the thyroid gland has a normal appearance. Esophagus is normal in appearance. No significant adenopathy. Lungs/Pleura: There are small bilateral pleural effusions. There is a stable irregular density at the LEFT lung apex, measuring 8 millimeters in diameter. There is smooth septal wall thickening. There are patchy areas of ground-glass density, primarily with a dependent distribution. No suspicious pulmonary nodules. No focal areas of consolidation. Upper Abdomen: There is atherosclerotic calcification of the abdominal aorta. Musculoskeletal: There are numerous compression fractures of the thoracic spine, stable in appearance. Review of the MIP images confirms the above findings. IMPRESSION: 1. Pericardial effusion and cardiomegaly.  Coronary artery disease. 2. Parenchymal changes consistent with pulmonary edema and small pleural effusions. 3. 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 Recommend annual imaging followup by CTA or MRA. This recommendation follows 2010 ACCF/AHA/AATS/ACR/ASA/SCA/SCAI/SIR/STS/SVM Guidelines for the Diagnosis and Management of Patients with Thoracic Aortic Disease. Circulation.2010; 121: K998-P382 4. Aortic aneurysm NOS (ICD10-I71.9). Aortic Atherosclerosis (ICD10-I70.0). 5. Stable probable scar at the LEFT lung apex. 6. Numerous thoracic compression fractures.  Electronically Signed   By: Nolon Nations M.D.   On: 10/28/2017 17:32    Consults: Treatment Team:  Pccm, Ander Gaster, MD Dionisio David, MD [name], DO Jonathon Bellows, MD   Subjective:    Overnight Issues: patient has been weaned off of BiPAP presently on nasal cannula, doing well this morning  Objective:  Vital signs for last 24 hours: Temp:  [97.8 F (36.6 C)-98.2 F (36.8 C)] 98.1 F (36.7 C) (05/10 0823) Pulse Rate:  [98-120] 105 (05/10 0823) Resp:  [17-32] 28 (05/10 0823) BP: (136-174)/(78-114) 139/88 (05/10 0800) SpO2:  [92 %-99 %] 98 % (05/10 0823) Weight:  [115 lb (52.2 kg)-125 lb (56.7 kg)] 125 lb (56.7 kg) (05/09 2100)  Hemodynamic parameters for last 24 hours:    Intake/Output from previous day: 05/09 0701 - 05/10 0700 In: 1634.6 [I.V.:1234.6; IV Piggyback:400] Out: 75 [Urine:75]  Intake/Output this shift: No intake/output data recorded.  Vent settings for last 24 hours:    Physical Exam:  Vision is awake, alert, frail elderly female. Vital signs: Please see the above listed vital signs HEENT: Patient with left facial asymmetry, family says this is a problem she has had secondary to strokes in the past. Trachea midline, no thyromegaly noted, no accessory muscle utilization Cardiovascular: Regular rate and rhythm Pulmonary: Rales appreciated bilateral posterior Abdominal: Positive bowel sounds, soft exam Chimneys: No clubbing cyanosis or edema noted Neurologic: Patient is awake, alert, has left facial droop, has  global weakness but no asymmetry   Assessment/Plan:   Hypoxemic respiratory failure. Acute on chronic. Chest x-ray is consistent with congestive heart failure. CT scan also revealed pericardial effusion, pending echocardiography. Being covered empirically for aspiration, will change Zosyn to Unasyn.  Elevated troponin. Peak troponin 6.88, EKG does not reveal acute ST segment changes consistent with NSTEMIreviewed pending  cardiology  Acute encephalopathyarea has resolved. Patient is awake alert and communicating  Hematemesisno further evidence of hematemesis. Hemoglobin and hematocrit is stable  TIA/CVA. Prior history of CVA.with facial asymmetry although apparently chronic in case patient needs to have cardiac catheterization or anticoagulation will obtain CT scan of head  Hyperglycemia. Sliding scale coverage  Critical Care Total Time 35  Terrianne Cavness 10/29/2017  *Care during the described time interval was provided by me and/or other providers on the critical care team.  I have reviewed this patient's available data, including medical history, events of note, physical examination and test results as part of my evaluation.

## 2017-10-29 NOTE — Progress Notes (Signed)
Inpatient Diabetes Program Recommendations  AACE/ADA: New Consensus Statement on Inpatient Glycemic Control (2015)  Target Ranges:  Prepandial:   less than 140 mg/dL      Peak postprandial:   less than 180 mg/dL (1-2 hours)      Critically ill patients:  140 - 180 mg/dL   Results for SMT, LOKEY (MRN [id number]) as of 10/29/2017 11:25  Ref. Range 10/28/2017 15:04 10/29/2017 03:59  Glucose Latest Ref Range: 65 - 99 mg/dL 310 (H) 228 (H)   Review of Glycemic Control  Diabetes history: DM2 Outpatient Diabetes medications: Metformin 1000 mg BID, Actos 15 mg daily Current orders for Inpatient glycemic control: None  Inpatient Diabetes Program Recommendations: Correction (SSI): Please consider ordering CBGs with Novolog 0-15 units TID with meals and Novolog 0-5 units QHS. HgbA1C: Please consider ordering an A1C to evaluate glycemic control over the past 2-3 months.  Thanks, Barnie Alderman, RN, MSN, CDE Diabetes Coordinator Inpatient Diabetes Program [phone] (Team Pager from 8am to 5pm)

## 2017-10-29 NOTE — Consult Note (Addendum)
Jonathon Bellows , MD [address] [place], [place], [place], [address] [address] [place], [place], [place], [address] Phone: [phone]  Fax: [phone]  Consultation  Referring Provider: Dr Tressia Miners  Primary Care Physician:  Baxter Hire, MD Primary Gastroenterologist:  None          Reason for Consultation:     Hematemesis   Date of Admission:  10/28/2017 Date of Consultation:  10/29/2017         HPI:   [name] is a 84 y.o. female with a past medical history of CA cervix, diabetes, endometrial carcinoma, polycythemia vera and TIAs.  She was brought to the hospital as she was found to be confused and lethargic per her caregivers and was found to be hypoxic at the hospital.  In the ER she had a CT scan of her chest and was noted to have pleural effusions and a pericardial effusion.  Empiric treatment for possible congestive heart failure and aspiration pneumonia was also commenced.  No history was able to be obtained from the patient as she was confused.  Apparently there was some vomitus yesterday morning and apparently contained some blood.  She is on Plavix, Fosamax and aspirin.  On admission her hemoglobin is 12.3 g with an MCV of 85.  She says she threw up a small qty of blood on one occasion. Denies any use of NSAID,s no abdominal pain .   Past Medical History:  Diagnosis Date  . Adenocarcinoma in situ of cervix   . Arthritis    hands  . Breast cancer ([place])   . Colon cancer ([place])   . Diabetes mellitus without complication ([place])   . Endometrial carcinoma ([facility])    s/p total abdominal hysterectomy  . H/O compression fracture of spine 2014   thoracic spine  . H/O polycythemia vera   . H/O TIA (transient ischemic attack) and stroke 09/2014, 03/2015   No deficits  . Hypertension   . Hyperuricemia   . Microalbuminuria   . Polycythemia vera ([place])   . Recurrent falls   . Skin cancer    face, legs  . Stroke [facility]) 2008   no deficits  . Trochanteric bursitis     . Varicose veins    treated    Past Surgical History:  Procedure Laterality Date  . ABDOMINAL HYSTERECTOMY    . BOWEL RESECTION N/A 03/28/2015   Procedure: SMALL BOWEL RESECTION;  Surgeon: Leonie Green, MD;  Location: [facility] ORS;  Service: General;  Laterality: N/A;  . CATARACT EXTRACTION W/ INTRAOCULAR LENS IMPLANT Right   . CATARACT EXTRACTION W/PHACO Left 10/07/2015   Procedure: CATARACT EXTRACTION PHACO AND INTRAOCULAR LENS PLACEMENT (IOC);  Surgeon: Ronnell Freshwater, MD;  Location: [facility];  Service: Ophthalmology;  Laterality: Left;  DIABETIC - oral meds VISION BLUE  . EXPLORATORY LAPAROTOMY     for fibroids  . TONSILLECTOMY      Prior to Admission medications   Medication Sig Start Date End Date Taking? Authorizing Provider  alendronate (FOSAMAX) 70 MG tablet Take 70 mg by mouth once a week. Take with a full glass of water on an empty stomach.   Yes [provider]  aspirin EC 81 MG tablet Take 81 mg by mouth daily.   Yes [provider]  calcium-vitamin D (OSCAL WITH D) 500-200 MG-UNIT per tablet Take 1 tablet by mouth daily.    Yes [provider]  carvedilol (COREG) 6.25 MG tablet Take 6.25  mg by mouth 2 (two) times daily with a meal.   Yes [provider]  chlorthalidone (HYGROTON) 25 MG tablet Take 25 mg by mouth daily.   Yes [provider]  clopidogrel (PLAVIX) 75 MG tablet Take 1 tablet (75 mg total) by mouth daily. 12/23/14  Yes Fritzi Mandes, MD  docusate sodium (COLACE) 250 MG capsule Take 250 mg by mouth daily as needed for constipation.   Yes [provider]  fexofenadine (ALLEGRA) 180 MG tablet Take 180 mg by mouth daily as needed.    Yes [provider]  hydroxyurea (HYDREA) 500 MG capsule TAKE 1 CAPSULE BY MOUTH ON MONDAY WEDNESDAY FRIDAY AND Saturday. Take  1 capsule every other Tuesday WITH FOOD Patient taking differently: 500 mg. TAKE 1 CAPSULE BY MOUTH ON Sunday through Friday.   Do no take on Saturday 07/06/17  Yes Karen Kitchens, NP  loperamide (IMODIUM) 2 MG capsule Take 4 mg by mouth as needed for diarrhea or loose stools.   Yes [provider]  metFORMIN (GLUCOPHAGE) 1000 MG tablet Take 1,000 mg by mouth 2 (two) times daily.    Yes [provider]  pioglitazone (ACTOS) 15 MG tablet Take 15 mg by mouth daily. 03/16/17 03/16/18 Yes [provider]  polyethylene glycol (MIRALAX / GLYCOLAX) packet Take 17 g by mouth daily as needed.   Yes [provider]  potassium chloride (K-DUR,KLOR-CON) 10 MEQ tablet Take 10 mEq by mouth daily.   Yes [provider]  pravastatin (PRAVACHOL) 20 MG tablet Take 20 mg by mouth at bedtime.    Yes [provider]  Probiotic Product (PROBIOTIC DAILY PO) Take 1 tablet by mouth daily.   Yes [provider]  ramipril (ALTACE) 10 MG capsule Take 10 mg by mouth 2 (two) times daily.    Yes [provider]  verapamil (CALAN-SR) 240 MG CR tablet Take 240 mg by mouth daily.   Yes [provider]    Family History  Problem Relation Age of Onset  . Cancer Brother        AML  . Heart attack Mother   . Breast cancer Mother   . Heart attack Father   . Heart attack Sister   . Diabetes Sister      Social History   Tobacco Use  . Smoking status: Former Research scientist (life sciences)  . Smokeless tobacco: Never Used  . Tobacco comment: quit 30+ yrs ago  Substance Use Topics  . Alcohol use: No  . Drug use: No    Allergies as of 10/28/2017 - Review Complete 10/28/2017  Allergen Reaction Noted  . Simvastatin  01/08/2015    Review of Systems:    All systems reviewed and negative except where noted in HPI.   Physical Exam:  Vital signs in last 24 hours: Temp:  [97.8 F (36.6 C)-98.2 F (36.8 C)] 98.1 F (36.7 C) (05/10 0823) Pulse Rate:  [98-120] 105 (05/10 0823) Resp:  [17-32] 28 (05/10 0823) BP: (136-174)/(78-114) 139/88 (05/10 0800) SpO2:  [92 %-99 %] 98 % (05/10 0823) Weight:   [115 lb (52.2 kg)-125 lb (56.7 kg)] 125 lb (56.7 kg) (05/09 2100)   General:   Pleasant, cooperative in NAD Head:  Normocephalic and atraumatic. Eyes:   No icterus.   Conjunctiva pink. PERRLA. Ears:  Normal auditory acuity. Neck:  Supple; no masses or thyroidomegaly Lungs: Respirations even and unlabored. Lungs clear to auscultation bilaterally.   No wheezes, crackles, or rhonchi.  Heart:  Regular rate and rhythm;  Without murmur, clicks, rubs or gallops Abdomen:  Soft, nondistended, nontender. Normal bowel sounds. No appreciable masses or hepatomegaly.  No rebound or guarding.  Neurologic:  Alert and oriented x3;  grossly normal neurologically. Skin:  Intact without significant lesions or rashes. Cervical Nodes:  No significant cervical adenopathy. Psych:  Alert and cooperative. Normal affect.  LAB RESULTS: Recent Labs    10/28/17 1504 10/29/17 0359  WBC 26.1* 21.2*  HGB 14.4 13.2  HCT 45.9 41.6  PLT 698* 454*   BMET Recent Labs    10/28/17 1504 10/29/17 0359  NA 138 140  K 3.5 3.8  CL 99* 104  CO2 25 25  GLUCOSE 310* 228*  BUN 25* 27*  CREATININE 0.87 1.11*  CALCIUM 9.2 8.6*   LFT Recent Labs    10/28/17 1504  PROT 7.5  ALBUMIN 4.3  AST 31  ALT 17  ALKPHOS 48  BILITOT 0.8   PT/INR Recent Labs    10/29/17 0359  LABPROT 13.7  INR 1.06    STUDIES: Dg Chest 1 View  Result Date: 10/28/2017 CLINICAL DATA:  Lethargy and weakness. EXAM: CHEST  1 VIEW COMPARISON:  Chest x-rays dated 03/23/2017 and 11/10/2012. FINDINGS: Cardiomegaly is stable. Atherosclerotic changes noted at the aortic arch. Bilateral perihilar opacities, presumed edema, LEFT greater than RIGHT. No pleural effusion or pneumothorax seen. Osseous structures about the chest are unremarkable. IMPRESSION: 1. Cardiomegaly with new perihilar opacities, presumably pulmonary edema, suggesting CHF/volume overload. Pneumonia is considered less likely. 2. Aortic atherosclerosis. Electronically Signed   By:  Franki Cabot M.D.   On: 10/28/2017 15:48   Ct Angio Chest Pe W And/or Wo Contrast  Result Date: 10/28/2017 CLINICAL DATA:  Patient presents to ED via [facility] from home (patient lives alone). Patients caregiver found her today in bed, alert and oriented but very lethargic and weak. Upon EMS arrival SpO2 73% on RA. EXAM: CT ANGIOGRAPHY CHEST WITH CONTRAST TECHNIQUE: Multidetector CT imaging of the chest was performed using the standard protocol during bolus administration of intravenous contrast. Multiplanar CT image reconstructions and MIPs were obtained to evaluate the vascular anatomy. CONTRAST:  3mL ISOVUE-370 IOPAMIDOL (ISOVUE-370) INJECTION 76% COMPARISON:  Chest x-ray 10/28/2017 FINDINGS: Cardiovascular: There is small pericardial effusion measuring maximum thickness of 1.0 centimeters. The heart is enlarged. There is significant coronary artery calcification. There is atherosclerotic calcification of the thoracic aorta thoracic aorta measures 3.1 centimeters. Descending aorta is 3.8 centimeters and is stable in appearance. No displaced intimal calcifications or evidence for dissection. Mediastinum/Nodes: The visualized portion of the thyroid gland has a normal appearance. Esophagus is normal in appearance. No significant adenopathy. Lungs/Pleura: There are small bilateral pleural effusions. There is a stable irregular density at the LEFT lung apex, measuring 8 millimeters in diameter. There is smooth septal wall thickening. There are patchy areas of ground-glass density, primarily with a dependent distribution. No suspicious pulmonary nodules. No focal areas of consolidation. Upper Abdomen: There is atherosclerotic calcification of the abdominal aorta. Musculoskeletal: There are numerous compression fractures of the thoracic spine, stable in appearance. Review of the MIP images confirms the above findings. IMPRESSION: 1. Pericardial effusion and cardiomegaly.  Coronary artery disease. 2. Parenchymal changes  consistent with pulmonary edema and small pleural effusions. 3. 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 Recommend annual imaging followup by CTA or MRA. This recommendation follows 2010 ACCF/AHA/AATS/ACR/ASA/SCA/SCAI/SIR/STS/SVM Guidelines for the Diagnosis and Management of Patients with Thoracic Aortic Disease. Circulation.2010; 121: M578-I696 4. Aortic aneurysm NOS (ICD10-I71.9). Aortic  Atherosclerosis (ICD10-I70.0). 5. Stable probable scar at the LEFT lung apex. 6. Numerous thoracic compression fractures. Electronically Signed   By: Nolon Nations M.D.   On: 10/28/2017 17:32    CBC Latest Ref Rng & Units 10/29/2017 10/28/2017 10/25/2017  WBC 3.6 - 11.0 K/uL 21.2(H) 26.1(H) 11.4(H)  Hemoglobin 12.0 - 16.0 g/dL 13.2 14.4 12.3  Hematocrit 35.0 - 47.0 % 41.6 45.9 39.1  Platelets 150 - 440 K/uL 454(H) 698(H) 453(H)     Impression / Plan:   SURIAH PERAGINE is a 84 y.o. y/o female who is being treated presently for hypoxemic respiratory failure from congestive heart failure and possibly an aspiration pneumonia.  Elevated troponin possible and possible NSTEMI.  Being evaluated by cardiology.  She also has a pericardial effusion.  I have been consulted for hematemesis of one episode at home.  She is on aspirin and Plavix.  Her hemoglobin is 13.2 g.  Due to her multiple comorbidities and presently very poor respiratory status I would think that any form of endoscopy evaluation would be very high risk especially considering the fact that her hemoglobin is in the normal range. At this point of time I would suggest that we monitor her closely and if she were to have an acute episode of hematemesis and drop in hemoglobin then we may be forced to perform an endoscopy sooner rather than later.  At this point of time the risks of endoscopy exceed the benefits.  I will suggest to continue on PPI at this point of time.  I will sign off.  Please call me if any  further GI concerns or questions.  We would like to thank you for the opportunity to participate in the care of [name].   Thank you for involving me in the care of this patient.      LOS: 1 day   Jonathon Bellows, MD  10/29/2017, 9:09 AM

## 2017-10-29 NOTE — Progress Notes (Signed)
Spoke to [facility] CT surgery coordinaor to have [facility] CT surgeon look at cath films and see if they would do CABG. May need plavix loaded if CABG not an option in AM>

## 2017-10-29 NOTE — Progress Notes (Signed)
Called Dr. Humphrey Rolls and was unable to leave voice message about patient's CT of head results.

## 2017-10-29 NOTE — H&P (Signed)
Chief Complaint: [place]  History obtained from: Patient and Chart   HPI:                                                                                                                                       [name] is an 84 y.o. female with a PMH of Stroke, Recurrent Falls, Polycythemia Vera, Microalbuminuria, HTN, TIA, Endometrial Carcinoma, Diabetes Mellitus, Colon Cancer, Breast Cancer, Arthritis, and Adenocarcinoma in Situ of Cervix presented to [facility] hospital with black emesis and unresponsive 2/2 hypoxia to [name] on 10/28/17.   CTA Chest revealed pericardial effusion, small pleural effusions, and pulmonary edema. She was treated for sepsis secondary to pneumonia. She is on ASA and Plavix.  She was noted to have NSTEMI and underwent cardiac cath which showed 3 vessel disease and severe LV dysfunction.  Following cath procedure patient had a head CT which showed right cerebellar hemorrhage with IVH. She was supposed to receive heparin drip, but was held. Patient transferred to [facility] hospital for further management.    Date last known well:  5.9.19 Time last known well: unclear tPA Given: no, IVH NIHSS: 1 Baseline MRS 0  Intracerebral Hemorrhage (ICH) Score  Glascow Coma Score 13-15 0  Age >/= 80 yes +1  ICH volume >/= 82ml  no 0  IVH yes +1  Infratentorial origin yes +1 Total:  3   Past Medical History:  Diagnosis Date  . Adenocarcinoma in situ of cervix   . Arthritis    hands  . Breast cancer ([place])   . Colon cancer ([place])   . Diabetes mellitus without complication ([place])   . Endometrial carcinoma (HCC)    s/p total abdominal hysterectomy  . H/O compression fracture of spine 2014   thoracic spine  . H/O polycythemia vera   . H/O TIA (transient ischemic attack) and stroke 09/2014, 03/2015   No deficits  . Hypertension   . Hyperuricemia   . Microalbuminuria   . Polycythemia vera ([place])   . Recurrent falls   . Skin cancer    face, legs  . Stroke [facility]) 2008   no  deficits  . Trochanteric bursitis   . Varicose veins    treated    Past Surgical History:  Procedure Laterality Date  . ABDOMINAL HYSTERECTOMY    . BOWEL RESECTION N/A 03/28/2015   Procedure: SMALL BOWEL RESECTION;  Surgeon: Leonie Green, MD;  Location: [facility] ORS;  Service: General;  Laterality: N/A;  . CATARACT EXTRACTION W/ INTRAOCULAR LENS IMPLANT Right   . CATARACT EXTRACTION W/PHACO Left 10/07/2015   Procedure: CATARACT EXTRACTION PHACO AND INTRAOCULAR LENS PLACEMENT (IOC);  Surgeon: Ronnell Freshwater, MD;  Location: [place];  Service: Ophthalmology;  Laterality: Left;  DIABETIC - oral meds VISION BLUE  . CORONARY ANGIOGRAPHY N/A 10/29/2017   Procedure: CORONARY ANGIOGRAPHY;  Surgeon: Dionisio David, MD;  Location: [facility]  INVASIVE CV LAB;  Service: Cardiovascular;  Laterality: N/A;  . EXPLORATORY LAPAROTOMY     for fibroids  . LEFT HEART CATH Right 10/29/2017   Procedure: Left Heart Cath;  Surgeon: Dionisio David, MD;  Location: [place] CV LAB;  Service: Cardiovascular;  Laterality: Right;  . TONSILLECTOMY      Family History  Problem Relation Age of Onset  . Cancer Brother        AML  . Heart attack Mother   . Breast cancer Mother   . Heart attack Father   . Heart attack Sister   . Diabetes Sister    Social History:  reports that she has quit smoking. She has never used smokeless tobacco. She reports that she does not drink alcohol or use drugs.  Allergies:  Allergies  Allergen Reactions  . Simvastatin     Other reaction(s): Muscle Pain    Medications:                                                                                                                        I reviewed home medications   ROS:                                                                                                                                     14 systems reviewed and negative except above    Examination:                                                                                                       General: Appears well-developed and well-nourished.  Psych: Affect appropriate to situation Eyes: No scleral injection HENT: No OP obstrucion Head: Normocephalic.  Cardiovascular: Normal rate and regular rhythm.  Respiratory: Effort normal and breath sounds normal to anterior ascultation GI: Soft.  No distension. There is no tenderness.  Skin: WDI   Neurological Examination Mental Status: Alert, oriented, thought content appropriate.  Dysarthric speech Able to follow 3 step commands without  difficulty. Cranial Nerves: II: Visual fields grossly normal,  III,IV, VI: ptosis not present, extra-ocular motions intact bilaterally, pupils equal, round, reactive to light and accommodation V,VII: smile symmetric, facial light touch sensation normal bilaterally VIII: hearing normal bilaterally IX,X: uvula rises symmetrically XI: bilateral shoulder shrug XII: midline tongue extension Motor: Right : Upper extremity   5/5    Left:     Upper extremity   5/5  Lower extremity   5/5     Lower extremity   5/5 Tone and bulk:normal tone throughout; no atrophy noted Sensory: Pinprick and light touch intact throughout, bilaterally Deep Tendon Reflexes: 2+ and symmetric throughout Plantars: Right: downgoing   Left: downgoing Cerebellar: On obvious dysmetria in both hands or heel to shin abnormality.  Gait: unable to assess     Lab Results: Basic Metabolic Panel: Recent Labs  Lab 10/28/17 1504 10/29/17 0359 10/29/17 1726  NA 138 140 139  K 3.5 3.8  --   CL 99* 104  --   CO2 25 25  --   GLUCOSE 310* 228*  --   BUN 25* 27*  --   CREATININE 0.87 1.11*  --   CALCIUM 9.2 8.6*  --     CBC: Recent Labs  Lab 10/25/17 1236 10/28/17 1504 10/29/17 0359  WBC 11.4* 26.1* 21.2*  NEUTROABS 9.3* 21.4*  --   HGB 12.3 14.4 13.2  HCT 39.1 45.9 41.6  MCV 85.2 86.1 85.8  PLT 453* 698* 454*    Coagulation Studies: Recent Labs     10/29/17 0359  LABPROT 13.7  INR 1.06    Imaging: Dg Chest 1 View  Result Date: 10/28/2017 CLINICAL DATA:  Lethargy and weakness. EXAM: CHEST  1 VIEW COMPARISON:  Chest x-rays dated 03/23/2017 and 11/10/2012. FINDINGS: Cardiomegaly is stable. Atherosclerotic changes noted at the aortic arch. Bilateral perihilar opacities, presumed edema, LEFT greater than RIGHT. No pleural effusion or pneumothorax seen. Osseous structures about the chest are unremarkable. IMPRESSION: 1. Cardiomegaly with new perihilar opacities, presumably pulmonary edema, suggesting CHF/volume overload. Pneumonia is considered less likely. 2. Aortic atherosclerosis. Electronically Signed   By: Franki Cabot M.D.   On: 10/28/2017 15:48   Ct Head Wo Contrast  Result Date: 10/29/2017 CLINICAL DATA:  Inpatient with acute encephalopathy. Respiratory failure. EXAM: CT HEAD WITHOUT CONTRAST TECHNIQUE: Contiguous axial images were obtained from the base of the skull through the vertex without intravenous contrast. COMPARISON:  CT head 03/30/2015. MR head 03/29/2015. FINDINGS: Brain: There is a RIGHT cerebellar parenchymal hemorrhage, with intraventricular extension. Measurements 31 x 16 x 19 mm, corresponding to a volume of approximately 5 mL. Blood extends from the fourth ventricle into the BILATERAL foramina of [place]. There is no hydrocephalus. Generalized atrophy is present. There is no acute infarct. Extensive hypoattenuation of white matter representing small vessel disease. No extra-axial hematoma. Vascular: No hyperdense vessel. Skull: Calvarium intact. Sinuses/Orbits: Mild sinus disease.  Negative orbits. Other: None. Compared with priors, no abnormality was seen in this location. IMPRESSION: RIGHT cerebellar parenchymal hemorrhage approximately 31 x 16 x 19 mm. There is extension of hemorrhage into the fourth ventricle, without hydrocephalus. A hypertensive bleed is most likely, but unrecognized trauma, anticoagulation, vascular  malformation, hemorrhagic metastatic neoplasm, are all considerations. Critical Value/emergent results were called by telephone at the time of interpretation on 10/29/2017 at 4:23 pm to Dr. [name] , who verbally acknowledged these results. Electronically Signed   By: Staci Righter M.D.   On: 10/29/2017 16:23   Ct Angio Chest Pe  W And/or Wo Contrast  Result Date: 10/28/2017 CLINICAL DATA:  Patient presents to ED via [facility] from home (patient lives alone). Patients caregiver found her today in bed, alert and oriented but very lethargic and weak. Upon EMS arrival SpO2 73% on RA. EXAM: CT ANGIOGRAPHY CHEST WITH CONTRAST TECHNIQUE: Multidetector CT imaging of the chest was performed using the standard protocol during bolus administration of intravenous contrast. Multiplanar CT image reconstructions and MIPs were obtained to evaluate the vascular anatomy. CONTRAST:  44mL ISOVUE-370 IOPAMIDOL (ISOVUE-370) INJECTION 76% COMPARISON:  Chest x-ray 10/28/2017 FINDINGS: Cardiovascular: There is small pericardial effusion measuring maximum thickness of 1.0 centimeters. The heart is enlarged. There is significant coronary artery calcification. There is atherosclerotic calcification of the thoracic aorta thoracic aorta measures 3.1 centimeters. Descending aorta is 3.8 centimeters and is stable in appearance. No displaced intimal calcifications or evidence for dissection. Mediastinum/Nodes: The visualized portion of the thyroid gland has a normal appearance. Esophagus is normal in appearance. No significant adenopathy. Lungs/Pleura: There are small bilateral pleural effusions. There is a stable irregular density at the LEFT lung apex, measuring 8 millimeters in diameter. There is smooth septal wall thickening. There are patchy areas of ground-glass density, primarily with a dependent distribution. No suspicious pulmonary nodules. No focal areas of consolidation. Upper Abdomen: There is atherosclerotic calcification of the  abdominal aorta. Musculoskeletal: There are numerous compression fractures of the thoracic spine, stable in appearance. Review of the MIP images confirms the above findings. IMPRESSION: 1. Pericardial effusion and cardiomegaly.  Coronary artery disease. 2. Parenchymal changes consistent with pulmonary edema and small pleural effusions. 3. 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 Recommend annual imaging followup by CTA or MRA. This recommendation follows 2010 ACCF/AHA/AATS/ACR/ASA/SCA/SCAI/SIR/STS/SVM Guidelines for the Diagnosis and Management of Patients with Thoracic Aortic Disease. Circulation.2010; 121: W546-E703 4. Aortic aneurysm NOS (ICD10-I71.9). Aortic Atherosclerosis (ICD10-I70.0). 5. Stable probable scar at the LEFT lung apex. 6. Numerous thoracic compression fractures. Electronically Signed   By: Nolon Nations M.D.   On: 10/28/2017 17:32     ASSESSMENT AND PLAN  84 yo female with a PMH of Stroke,  HTN, TIA, Endometrial Carcinoma, Diabetes Mellitus, Colon Cancer, Breast Cancer, Arthritis, and Adenocarcinoma in Situ of Cervix with right cerebellar hemorrhage with IVH. She was supposed to receive heparin drip, but was held. Clinically stable. On ASA +plavix for CAD.   Right cerebellar hemorrhage with IVH ICH score 3  Plan Repeat CT head : stable Hold ASA, sq heparin BP less than 140/90, Cardene drip ordered Close monitoring PT/OT Swallow eval  Cerebellar edema Mild vasogenic edema Started 3% NACL Goal na 150-160   NSTEMI - s/p cardiac  Catheterization - shows3 vessel disease and severe LV dysfunction. Cards planning on CT surgery referral for CABG -- No heparin drip due to [place] - Hold ASA, Plavix   Acute hypoxic respiratory failure-concern for aspiration pneumonia during unresponsive episode -Follow-up cultures.  Currently on Zosyn -Weaned off BiPAP at [facility]  - On 1 to 2 L supplemental oxygen  Acute on Chronic  Diastolic heart failure Echo: LVEF 25% Akinesis of the anteroseptal myocardium. Akinesis of the anterior myocardium. Mildly dilated LA. Chamber dilatation with severe LV systolic dysfunction with anteroapical akinesis sugestive ASWMI, and no thrombii present in LV.  Will perform diuresis as needed  Hematemesis-GI consulted at [facility].  On Protonix.  No plans for any endoscopy given her acute cardiac and respiratory condition.   This patient is neurologically critically ill and medically critically  ill due to  He is at risk for significant risk of neurological worsening from cerebral edema, hydrocephalus,  death from brain herniation, heart failure, worsening hemorrhage,  infection, respiratory failure and seizure. This patient's care requires constant monitoring of vital signs, hemodynamics, respiratory and cardiac monitoring, review of multiple databases, neurological assessment, discussion with family, other specialists and medical decision making of high complexity.  I spent 70  minutes of neurocritical time in the care of this patient.        Shamarie Call [facility] Pager Number [phone]

## 2017-10-29 NOTE — Progress Notes (Signed)
Dr. Humphrey Rolls notified of CT of head- Per Dr. Jefferson Fuel- pt to transfer to [facility]-

## 2017-10-29 NOTE — Progress Notes (Signed)
*  PRELIMINARY RESULTS* Echocardiogram 2D Echocardiogram has been performed.  [name] 10/29/2017, 9:54 AM

## 2017-10-29 NOTE — Progress Notes (Signed)
Called 4 [place] ICU to give report- RN on the phone with [facility] at this time- I left my name and number for RN to call me back.

## 2017-10-30 ENCOUNTER — Encounter (HOSPITAL_COMMUNITY): Payer: Self-pay | Admitting: Neurological Surgery

## 2017-10-30 ENCOUNTER — Inpatient Hospital Stay: Payer: Self-pay

## 2017-10-30 ENCOUNTER — Inpatient Hospital Stay (HOSPITAL_COMMUNITY): Payer: Medicare Other

## 2017-10-30 DIAGNOSIS — J9601 Acute respiratory failure with hypoxia: Secondary | ICD-10-CM | POA: Diagnosis not present

## 2017-10-30 DIAGNOSIS — I613 Nontraumatic intracerebral hemorrhage in brain stem: Secondary | ICD-10-CM

## 2017-10-30 DIAGNOSIS — I615 Nontraumatic intracerebral hemorrhage, intraventricular: Secondary | ICD-10-CM

## 2017-10-30 LAB — GLUCOSE, CAPILLARY
Glucose-Capillary: 259 mg/dL — ABNORMAL HIGH (ref 65–99)
Glucose-Capillary: 179 mg/dL — ABNORMAL HIGH (ref 65–99)
Glucose-Capillary: 188 mg/dL — ABNORMAL HIGH (ref 65–99)
Glucose-Capillary: 216 mg/dL — ABNORMAL HIGH (ref 65–99)
Glucose-Capillary: 224 mg/dL — ABNORMAL HIGH (ref 65–99)

## 2017-10-30 LAB — CBC
HCT: 40.9 % (ref 36.0–46.0)
Hemoglobin: 12.5 g/dL (ref 12.0–15.0)
MCH: 26.5 pg (ref 26.0–34.0)
MCHC: 30.6 g/dL (ref 30.0–36.0)
MCV: 86.8 fL (ref 78.0–100.0)
Platelets: 368 10*3/uL (ref 150–400)
RBC: 4.71 MIL/uL (ref 3.87–5.11)
RDW: 18.5 % — ABNORMAL HIGH (ref 11.5–15.5)
WBC: 18.3 10*3/uL — ABNORMAL HIGH (ref 4.0–10.5)

## 2017-10-30 LAB — MAGNESIUM: Magnesium: 1.3 mg/dL — ABNORMAL LOW (ref 1.7–2.4)

## 2017-10-30 LAB — BRAIN NATRIURETIC PEPTIDE: B Natriuretic Peptide: >4500 pg/mL — ABNORMAL HIGH (ref 0.0–100.0)

## 2017-10-30 LAB — SODIUM
Sodium: 140 mmol/L (ref 135–145)
Sodium: 146 mmol/L — ABNORMAL HIGH (ref 135–145)
Sodium: 146 mmol/L — ABNORMAL HIGH (ref 135–145)

## 2017-10-30 LAB — BASIC METABOLIC PANEL
Anion gap: 13 (ref 5–15)
BUN: 26 mg/dL — ABNORMAL HIGH (ref 6–20)
CO2: 26 mmol/L (ref 22–32)
Calcium: 8.3 mg/dL — ABNORMAL LOW (ref 8.9–10.3)
Chloride: 104 mmol/L (ref 101–111)
Creatinine, Ser: 1.21 mg/dL — ABNORMAL HIGH (ref 0.44–1.00)
GFR calc Af Amer: 47 mL/min — ABNORMAL LOW (ref >60)
GFR calc non Af Amer: 40 mL/min — ABNORMAL LOW (ref >60)
Glucose, Bld: 229 mg/dL — ABNORMAL HIGH (ref 65–99)
Potassium: 2.8 mmol/L — ABNORMAL LOW (ref 3.5–5.1)
Sodium: 143 mmol/L (ref 135–145)

## 2017-10-30 LAB — PROCALCITONIN: Procalcitonin: 0.26 ng/mL

## 2017-10-30 LAB — PHOSPHORUS: Phosphorus: 3.4 mg/dL (ref 2.5–4.6)

## 2017-10-30 MED ORDER — CHLORHEXIDINE GLUCONATE CLOTH 2 % EX PADS
6.0000 | MEDICATED_PAD | Freq: Every day | CUTANEOUS | Status: DC
  Administered 2017-10-31 – 2017-11-03 (×4): 6 via TOPICAL

## 2017-10-30 MED ORDER — LABETALOL HCL 5 MG/ML IV SOLN
20.0000 mg | Freq: Once | INTRAVENOUS | Status: AC
  Administered 2017-10-30: 20 mg via INTRAVENOUS
  Filled 2017-10-30: qty 4

## 2017-10-30 MED ORDER — CARVEDILOL 3.125 MG PO TABS
6.2500 mg | ORAL_TABLET | Freq: Two times a day (BID) | ORAL | Status: DC
  Administered 2017-10-30 – 2017-10-31 (×2): 6.25 mg via ORAL
  Filled 2017-10-30 (×2): qty 2

## 2017-10-30 MED ORDER — MAGNESIUM SULFATE 50 % IJ SOLN
4.0000 g | Freq: Once | INTRAVENOUS | Status: AC
  Administered 2017-10-30: 4 g via INTRAVENOUS
  Filled 2017-10-30: qty 8

## 2017-10-30 MED ORDER — POTASSIUM CHLORIDE 10 MEQ/100ML IV SOLN: 10.0000 meq | INTRAVENOUS | Status: AC

## 2017-10-30 MED ORDER — LABETALOL HCL 5 MG/ML IV SOLN
10.0000 mg | INTRAVENOUS | Status: DC | PRN
  Administered 2017-10-31 – 2017-11-01 (×3): 10 mg via INTRAVENOUS
  Filled 2017-10-30 (×3): qty 4

## 2017-10-30 MED ORDER — SODIUM CHLORIDE 0.9% FLUSH
10.0000 mL | Freq: Two times a day (BID) | INTRAVENOUS | Status: DC
  Administered 2017-10-31 – 2017-11-03 (×6): 10 mL

## 2017-10-30 MED ORDER — POTASSIUM CHLORIDE 10 MEQ/100ML IV SOLN
10.0000 meq | INTRAVENOUS | Status: AC
  Administered 2017-10-30 (×4): 10 meq via INTRAVENOUS
  Filled 2017-10-30 (×4): qty 100

## 2017-10-30 MED ORDER — FUROSEMIDE 10 MG/ML IJ SOLN
40.0000 mg | Freq: Three times a day (TID) | INTRAMUSCULAR | Status: DC
  Administered 2017-10-30 – 2017-10-31 (×4): 40 mg via INTRAVENOUS
  Filled 2017-10-30 (×4): qty 4

## 2017-10-30 MED ORDER — LABETALOL HCL 5 MG/ML IV SOLN: 20.0000 mg | Freq: Once | INTRAVENOUS | Status: DC

## 2017-10-30 MED ORDER — ORAL CARE MOUTH RINSE
15.0000 mL | Freq: Two times a day (BID) | OROMUCOSAL | Status: DC
  Administered 2017-10-30 – 2017-11-04 (×11): 15 mL via OROMUCOSAL

## 2017-10-30 MED ORDER — POTASSIUM CHLORIDE CRYS ER 20 MEQ PO TBCR
40.0000 meq | EXTENDED_RELEASE_TABLET | Freq: Once | ORAL | Status: AC
  Administered 2017-10-30: 40 meq via ORAL
  Filled 2017-10-30: qty 2

## 2017-10-30 MED ORDER — SODIUM CHLORIDE 0.9% FLUSH: 10.0000 mL | INTRAVENOUS | Status: DC | PRN

## 2017-10-30 MED ORDER — FUROSEMIDE 10 MG/ML IJ SOLN
40.0000 mg | Freq: Four times a day (QID) | INTRAMUSCULAR | Status: DC
Start: 2017-10-30 — End: 2017-10-30

## 2017-10-30 NOTE — Progress Notes (Signed)
STROKE TEAM PROGRESS NOTE   HISTORY OF PRESENT ILLNESS (per record) AUTUMNE KALLIO is an 84 y.o. female with a PMH of Stroke, Recurrent Falls, Polycythemia Vera, Microalbuminuria, HTN, TIA, Endometrial Carcinoma, Diabetes Mellitus, Colon Cancer, Breast Cancer, Arthritis, and Adenocarcinoma in Situ of Cervix presented to [facility] hospital with black emesis and unresponsive 2/2 hypoxia to [facility] on 10/28/17.   CTA Chest revealed pericardial effusion, small pleural effusions, and pulmonary edema. She was treated for sepsis secondary to pneumonia. She is on ASA and Plavix.  She was noted to have NSTEMI and underwent cardiac cath which showed 3 vessel disease and severe LV dysfunction.  Following cath procedure patient had a head CT which showed right cerebellar hemorrhage with IVH. She was supposed to receive heparin drip, but was held. Patient transferred to [facility] hospital for further management.    Date last known well:  5.9.19 Time last known well: unclear tPA Given: no, IVH NIHSS: 1 Baseline MRS 0     SUBJECTIVE (INTERVAL HISTORY) Her daughter is at the bedside, pt feels better but reported R arm heaviness, daughter was updated about the exam, ct findings and the plan. R forearm 3% nacl infiltration, gtt was stopped, sheduled for a picc placement     OBJECTIVE Temp:  [97.9 F (36.6 C)-99.6 F (37.6 C)] 98.1 F (36.7 C) (05/11 0800) Pulse Rate:  [90-106] 90 (05/11 0800) Cardiac Rhythm: Sinus tachycardia (05/10 1921) Resp:  [13-26] 16 (05/11 0800) BP: (119-151)/(69-99) 148/97 (05/11 0800) SpO2:  [86 %-99 %] 98 % (05/11 0800) Weight:  [125 lb (56.7 kg)] 125 lb (56.7 kg) (05/10 1921)  CBC:  Recent Labs  Lab 10/25/17 1236 10/28/17 1504 10/29/17 0359 10/30/17 0249  WBC 11.4* 26.1* 21.2* 18.3*  NEUTROABS 9.3* 21.4*  --   --   HGB 12.3 14.4 13.2 12.5  HCT 39.1 45.9 41.6 40.9  MCV 85.2 86.1 85.8 86.8  PLT 453* 698* 454* 062    Basic Metabolic Panel:  Recent Labs  Lab  10/29/17 0359  10/29/17 2344 10/30/17 0249  NA 140   < > 140 143  K 3.8  --   --  2.8*  CL 104  --   --  104  CO2 25  --   --  26  GLUCOSE 228*  --   --  229*  BUN 27*  --   --  26*  CREATININE 1.11*  --   --  1.21*  CALCIUM 8.6*  --   --  8.3*  MG  --   --   --  1.3*  PHOS  --   --   --  3.4   < > = values in this interval not displayed.    Lipid Panel:     Component Value Date/Time   CHOL 148 12/20/2014 1533   TRIG 130 12/20/2014 1533   HDL 44 12/20/2014 1533   CHOLHDL 3.4 12/20/2014 1533   VLDL 26 12/20/2014 1533   LDLCALC 78 12/20/2014 1533   HgbA1c:  Lab Results  Component Value Date   HGBA1C 7.0 (H) 03/29/2015   Urine Drug Screen:     Component Value Date/Time   LABOPIA NONE DETECTED 12/20/2014 1639   COCAINSCRNUR NONE DETECTED 12/20/2014 1639   LABBENZ NONE DETECTED 12/20/2014 1639   AMPHETMU NONE DETECTED 12/20/2014 1639   THCU NONE DETECTED 12/20/2014 1639   LABBARB NONE DETECTED 12/20/2014 1639    Alcohol Level     Component Value Date/Time   ETH <5 12/20/2014 1533  IMAGING   Dg Chest 1 View 10/28/2017 IMPRESSION:  1. Cardiomegaly with new perihilar opacities, presumably pulmonary edema, suggesting CHF/volume overload. Pneumonia is considered less likely.  2. Aortic atherosclerosis.     Ct Head Wo Contrast 10/29/2017 IMPRESSION:  Stable RIGHT cerebellar hemorrhage, with extension into the IV ventricle without hydrocephalus.     Ct Head Wo Contrast 10/29/2017 IMPRESSION:  RIGHT cerebellar parenchymal hemorrhage approximately 31 x 16 x 19 mm. There is extension of hemorrhage into the fourth ventricle, without hydrocephalus. A hypertensive bleed is most likely, but unrecognized trauma, anticoagulation, vascular malformation, hemorrhagic metastatic neoplasm, are all considerations.     Ct Angio Chest Pe W And/or Wo Contrast 10/28/2017 IMPRESSION:  1. Pericardial effusion and cardiomegaly.  Coronary artery disease.  2. Parenchymal changes  consistent with pulmonary edema and small pleural effusions.  3. 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 Recommend annual imaging followup by CTA or MRA. This recommendation follows 2010 ACCF/AHA/AATS/ACR/ASA/SCA/SCAI/SIR/STS/SVM Guidelines for the Diagnosis and Management of Patients with Thoracic Aortic Disease. Circulation.2010; 121: U045-W098  4. Aortic aneurysm NOS (ICD10-I71.9). Aortic Atherosclerosis (ICD10-I70.0).  5. Stable probable scar at the LEFT lung apex. 6. Numerous thoracic compression fractures.     Dg Chest Port 1 View 10/30/2017 IMPRESSION:  Cardiomegaly with findings of CHF and small bilateral pleural effusions. No pneumothorax.     Transthoracic Echocardiogram  10/29/2017 Study Conclusions - Left ventricle: The cavity size was severely dilated. Systolic   function was severely reduced. The estimated ejection fraction   was 25%. Akinesis of the anteroseptal myocardium. Akinesis of the   anterior myocardium. - Aortic valve: There was trivial regurgitation. Valve area (VTI):   1.68 cm^2. Valve area (Vmax): 1.9 cm^2. Valve area (Vmean): 1.81   cm^2. - Mitral valve: There was mild regurgitation. Valve area by   continuity equation (using LVOT flow): 2.19 cm^2. - Left atrium: The atrium was mildly dilated. - Right ventricle: The cavity size was mildly dilated. - Right atrium: The atrium was mildly dilated. - Pericardium, extracardiac: A trivial pericardial effusion was   identified posterior to the heart. Features were not consistent   with tamponade physiology. There was a left pleural effusion. - 4 Chamber dilatation with severe LV systolic dysfunction with   anteroapical akinesis sugestive ASWMI, and no thrombii present in   LV. Fibrocalcified aortic valve without AS.   Left Heart Cath - Neoma Laming MD [facility] 10/29/2017  Ost Cx to Prox Cx lesion is 80% stenosed with 90% stenosed side branch in  Ost 1st Mrg to 1st Mrg.  Mid LAD lesion is 95% stenosed.  Mid RCA lesion is 75% stenosed.  Ost LAD to Prox LAD lesion is 70% stenosed. Severe 3 vessel disease with severe LV systolic dysfunction and anteroapical aneurysm. Advise CABG at [facility].   PHYSICAL EXAM Vitals:   10/30/17 0500 10/30/17 0600 10/30/17 0700 10/30/17 0800  BP: 135/85 (!) 144/83 (!) 141/83 (!) 148/97  Pulse: 99 92 (!) 103 90  Resp: 19 (!) 26 (!) 24 16  Temp:    98.1 F (36.7 C)  TempSrc:    Oral  SpO2: 96% 96% 95% 98%  Weight:        General -  Heart - Regular rate and rhythm - no murmer appreciated Lungs - Clear to auscultation anteriorly Abdomen - Soft - non tender Extremities - Distal pulses intact - no edema Skin - Warm and dry, R forearm IV infiltrate, normal temperature, small area, mild  erythema   Mental Status: GCS 14, awake, alert oriented to self and place, follows commands, pupils reactive B/L, EOMI, visual field intact, speech dysarthric, face symmetric, motor 5/5 all group, sensory intact, slow finger to nose B/L   ASSESSMENT/PLAN Ms. MIRAYA CUDNEY is a 84 y.o. female with history of previous stroke, recurrent falls, Polycythemia Vera, Microalbuminuria, HTN, TIA, Endometrial Carcinoma, Diabetes Mellitus, Colon Cancer, Breast Cancer, Arthritis, Adenocarcinoma in Situ of Cervix, severe three-vessel coronary artery disease, ejection fraction 25%, and congestive heart failure presented to [facility] hospital with black emesis and unresponsive 2/2 hypoxia transferred to [facility] with a RIGHT cerebellar parenchymal hemorrhage.  She did not receive IV t-PA due to [place].  RIGHT cerebellar parenchymal hemorrhage   CT head - RIGHT cerebellar parenchymal hemorrhage approximately 31 x 16 x 19 mm extension of hemorrhage into the fourth ventricle, stable on a follow up CTH stable ICH/IVH no Hydrocephalus    MRI head - not performed  MRA head - not performed  Repeat CTH tomorrow to evaluate/Follow up on   ICH/IVH  At risk for hydrocephalus, on hyperosmolar therapy 3% nacl, keep NA 140-150  Neurosurgery consult was called for IVH and the potential need for EVD or hemicrani, pt and family are not aure if they would proceed with surgery if needed but they would like to discuss this option with the neurosurgeon   Carotid Doppler - not indicated  2D Echo - as above EF 25%  LDL - 78  HgbA1c - 7.0  VTE prophylaxis - SCDs Diet Order           Diet Carb Modified Fluid consistency: Thin; Room service appropriate? Yes  Diet effective now          clopidogrel 75 mg daily prior to admission, now on No antithrombotic  Ongoing aggressive stroke risk factor management  Therapy recommendations:  pending  Disposition:  Pending  Hypertension  Stable . Systolic blood pressure goal less than 140  . Long-term BP goal normotensive  Hyperlipidemia  Lipid lowering medication PTA:  Pravachol 20 mg daily  LDL 78, goal < 70  Current lipid lowering medication: none - resume Pravachol at discharge. (muscle aches with simvastatin)  Continue statin at discharge  Diabetes  HgbA1c 7.0, goal < 7.0  Uncontrolled  Other Stroke Risk Factors  Advanced age  Former cigarette smoker - quit  Hx stroke/TIA  Coronary artery disease   Other Active Problems/plan   Severe 3 vessel CAD - NSTEMI  EF 25% - CHF -> Lasix 40 mg IV every 8 hours  Hypokalemia - 2.8 -> supplement  Magnesium 1.3 -> supplement  Leukocytosis - 18.3  BUN 26 ; creatinine 1.2  Hematemesis - GI consulted at [facility]. On Protonix. 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 After hours, contact General Neurology

## 2017-10-30 NOTE — Progress Notes (Signed)
Peripherally Inserted Central Catheter/Midline Placement  The IV Nurse has discussed with the patient and/or persons authorized to consent for the patient, the purpose of this procedure and the potential benefits and risks involved with this procedure.  The benefits include less needle sticks, lab draws from the catheter, and the patient may be discharged home with the catheter. Risks include, but not limited to, infection, bleeding, blood clot (thrombus formation), and puncture of an artery; nerve damage and irregular heartbeat and possibility to perform a PICC exchange if needed/ordered by physician.  Alternatives to this procedure were also discussed.  Bard Power PICC patient education guide, fact sheet on infection prevention and patient information card has been provided to patient /or left at bedside.  Dtr and granddtr at bedside also agreeable to PICC placement.  PICC/Midline Placement Documentation  PICC Double Lumen 10/30/17 PICC Right Brachial 37 cm 2 cm (Active)  Indication for Insertion or Continuance of Line Vasoactive infusions;Prolonged intravenous therapies 10/30/2017  4:14 PM  Exposed Catheter (cm) 2 cm 10/30/2017  4:14 PM  Site Assessment Clean;Dry;Intact 10/30/2017  4:14 PM  Lumen #1 Status Flushed;Saline locked;Blood return noted 10/30/2017  4:14 PM  Lumen #2 Status Flushed;Saline locked;Blood return noted 10/30/2017  4:14 PM  Dressing Type Transparent 10/30/2017  4:14 PM  Dressing Status Clean;Dry;Intact;Antimicrobial disc in place 10/30/2017  4:14 PM  Line Care Connections checked and tightened 10/30/2017  4:14 PM  Line Adjustment (NICU/IV Team Only) No 10/30/2017  4:14 PM  Dressing Intervention New dressing 10/30/2017  4:14 PM  Dressing Change Due 11/06/17 10/30/2017  4:14 PM       Rolena Infante 10/30/2017, 4:15 PM

## 2017-10-30 NOTE — Progress Notes (Signed)
Brief note:  Pt admitted this am with R cerebellar hemorrhage w/ IVH and edema by neuro PCCM consulted for resp failure /pulm edema  Pt with NSTEMI and [place] 5/10 post procedure mental status changes.   Pt currently with stable b/p . And O2 sats  PCT 0.26 , no abx at this time. Tr wbc/temp  On Hypertonic Saline IV , needs PICC, IV team contacted.  She is alert and moves all ext.  Comfortable in bed  Family updated at bedside    Leannah Guse NP-C  [facility]  [phone]   .10/30/2017

## 2017-10-30 NOTE — Consult Note (Signed)
Reason for Consult:right cerebellar hemorrhage Referring Physician: CCM  [name] is an 84 y.o. female.   HPI:  84 year old female problems transfer from [facility] with a cerebellar hemorrhage. It appears she had recent pneumonia and possibly sepsis, also myocardial infarction. Cardiac cath results on the chart. She denies headache. She denies visual changes. She denies nausea and vomiting. She denies numbness tingling or weakness.  Past Medical History:  Diagnosis Date  . Adenocarcinoma in situ of cervix   . Arthritis    hands  . Breast cancer ([place])   . Colon cancer ([place])   . Diabetes mellitus without complication ([facility])   . Endometrial carcinoma ([facility])    s/p total abdominal hysterectomy  . H/O compression fracture of spine 2014   thoracic spine  . H/O polycythemia vera   . H/O TIA (transient ischemic attack) and stroke 09/2014, 03/2015   No deficits  . Hypertension   . Hyperuricemia   . Microalbuminuria   . Polycythemia vera ([place])   . Recurrent falls   . Skin cancer    face, legs  . Stroke [facility]) 2008   no deficits  . Trochanteric bursitis   . Varicose veins    treated    Past Surgical History:  Procedure Laterality Date  . ABDOMINAL HYSTERECTOMY    . BOWEL RESECTION N/A 03/28/2015   Procedure: SMALL BOWEL RESECTION;  Surgeon: Leonie Green, MD;  Location: [facility] ORS;  Service: General;  Laterality: N/A;  . CATARACT EXTRACTION W/ INTRAOCULAR LENS IMPLANT Right   . CATARACT EXTRACTION W/PHACO Left 10/07/2015   Procedure: CATARACT EXTRACTION PHACO AND INTRAOCULAR LENS PLACEMENT (IOC);  Surgeon: Ronnell Freshwater, MD;  Location: [facility];  Service: Ophthalmology;  Laterality: Left;  DIABETIC - oral meds VISION BLUE  . CORONARY ANGIOGRAPHY N/A 10/29/2017   Procedure: CORONARY ANGIOGRAPHY;  Surgeon: Dionisio , MD;  Location: [facility];  Service: Cardiovascular;  Laterality: N/A;  . EXPLORATORY LAPAROTOMY     for fibroids  .  LEFT HEART CATH Right 10/29/2017   Procedure: Left Heart Cath;  Surgeon: Dionisio , MD;  Location: [facility];  Service: Cardiovascular;  Laterality: Right;  . TONSILLECTOMY      Allergies  Allergen Reactions  . Simvastatin     Other reaction(s): Muscle Pain    Social History   Tobacco Use  . Smoking status: Former Research scientist (life sciences)  . Smokeless tobacco: Never Used  . Tobacco comment: quit 30+ yrs ago  Substance Use Topics  . Alcohol use: No    Family History  Problem Relation Age of Onset  . Cancer Brother        AML  . Heart attack Mother   . Breast cancer Mother   . Heart attack Father   . Heart attack Sister   . Diabetes Sister      Review of Systems  Positive ROS: negative  All other systems have been reviewed and were otherwise negative with the exception of those mentioned in the HPI and as above.  Objective: Vital signs in last 24 hours: Temp:  [96.5 F (35.8 C)-99.6 F (37.6 C)] 96.5 F (35.8 C) (05/11 1200) Pulse Rate:  [77-105] 82 (05/11 1400) Resp:  [13-26] 16 (05/11 1400) BP: (110-151)/(69-100) 127/83 (05/11 1400) SpO2:  [92 %-100 %] 100 % (05/11 1400) Weight:  [56.7 kg (125 lb)] 56.7 kg (125 lb) (05/10 1921)  General Appearance: Alert, cooperative, no distress, appears stated age Head: Normocephalic, without obvious abnormality,  atraumatic Eyes: PERRL, conjunctiva/corneas clear, EOM's intact      Neck: Supple Lungs:respirations unlabored Heart: Regular rate and rhythm   NEUROLOGIC:   Mental status: A&O x4, no aphasia, good attention span, Memory and fund of knowledgeare difficult to assess and she has dysarthric speech Motor Exam - grossly normal, normal tone and bulk Sensory Exam - grossly normal Reflexes: symmetric, no pathologic reflexes, No Hoffman's, No clonus Coordination - grossly normal Gait - not tested Balance - not tested Cranial Nerves: I: smell Not tested  II: visual acuity  OS: na    OD: na  II: visual fields Full to  confrontation  II: pupils Equal, round, reactive to light  III,VII: ptosis None  III,IV,VI: extraocular muscles  Full ROM  V: mastication Normal  V: facial light touch sensation  Normal  V,VII: corneal reflex  Present  VII: facial muscle function - upper  Normal  VII: facial muscle function - lower Normal  VIII: hearing Not tested  IX: soft palate elevation  Normal  IX,X: gag reflex Present  XI: trapezius strength  5/5  XI: sternocleidomastoid strength 5/5  XI: neck flexion strength  5/5  XII: tongue strength  Normal    Data Review Lab Results  Component Value Date   WBC 18.3 (H) 10/30/2017   HGB 12.5 10/30/2017   HCT 40.9 10/30/2017   MCV 86.8 10/30/2017   PLT 368 10/30/2017   Lab Results  Component Value Date   NA 146 (H) 10/30/2017   K 2.8 (L) 10/30/2017   CL 104 10/30/2017   CO2 26 10/30/2017   BUN 26 (H) 10/30/2017   CREATININE 1.21 (H) 10/30/2017   GLUCOSE 229 (H) 10/30/2017   Lab Results  Component Value Date   INR 1.06 10/29/2017    Radiology: Ct Head Wo Contrast  Result Date: 10/29/2017 CLINICAL DATA:  Continued surveillance intracranial hemorrhage. Transfer to different facility. EXAM: CT HEAD WITHOUT CONTRAST TECHNIQUE: Contiguous axial images were obtained from the base of the skull through the vertex without intravenous contrast. COMPARISON:  CT head earlier in the day. FINDINGS: Brain: Redemonstrated is a RIGHT cerebellar hemorrhage, not significantly changed from priors, cross-section 15 x 29 x 19 mm. Extension into the IV ventricle without hydrocephalus. Vascular: Calcification of the cavernous internal carotid arteries consistent with cerebrovascular atherosclerotic disease. No signs of intracranial large vessel occlusion. Blood pool affect is decreased, related to intravascular contrast from prior cardiac catheterization. Skull: Normal. Negative for fracture or focal lesion. Sinuses/Orbits: No significant paranasal sinus disease. Negative orbits. Other:  None. IMPRESSION: Stable RIGHT cerebellar hemorrhage, with extension into the IV ventricle without hydrocephalus. Electronically Signed   By: Staci Righter M.D.   On: 10/29/2017 23:05   Ct Head Wo Contrast  Result Date: 10/29/2017 CLINICAL DATA:  Inpatient with acute encephalopathy. Respiratory failure. EXAM: CT HEAD WITHOUT CONTRAST TECHNIQUE: Contiguous axial images were obtained from the base of the skull through the vertex without intravenous contrast. COMPARISON:  CT head 03/30/2015. MR head 03/29/2015. FINDINGS: Brain: There is a RIGHT cerebellar parenchymal hemorrhage, with intraventricular extension. Measurements 31 x 16 x 19 mm, corresponding to a volume of approximately 5 mL. Blood extends from the fourth ventricle into the BILATERAL foramina of [place]. There is no hydrocephalus. Generalized atrophy is present. There is no acute infarct. Extensive hypoattenuation of white matter representing small vessel disease. No extra-axial hematoma. Vascular: No hyperdense vessel. Skull: Calvarium intact. Sinuses/Orbits: Mild sinus disease.  Negative orbits. Other: None. Compared with priors, no abnormality was seen in  this location. IMPRESSION: RIGHT cerebellar parenchymal hemorrhage approximately 31 x 16 x 19 mm. There is extension of hemorrhage into the fourth ventricle, without hydrocephalus. A hypertensive bleed is most likely, but unrecognized trauma, anticoagulation, vascular malformation, hemorrhagic metastatic neoplasm, are all considerations. Critical Value/emergent results were called by telephone at the time of interpretation on 10/29/2017 at 4:23 pm to Dr. [name] , who verbally acknowledged these results. Electronically Signed   By: Staci Righter M.D.   On: 10/29/2017 16:23   Dg Chest Port 1 View  Result Date: 10/30/2017 CLINICAL DATA:  84 year old female with acute respiratory failure. EXAM: PORTABLE CHEST 1 VIEW COMPARISON:  Chest CT dated 10/28/2017 FINDINGS: There is cardiomegaly, mild  interstitial edema, and small bilateral pleural effusions. Mildly prominent pulmonary artery may represent a degree of pulmonary hypertension. No pneumothorax. There is atherosclerotic calcification of the aorta. No acute osseous pathology. IMPRESSION: Cardiomegaly with findings of CHF and small bilateral pleural effusions. No pneumothorax. Electronically Signed   By: Anner Crete M.D.   On: 10/30/2017 02:28   [place] Ekg Site Rite  Result Date: 10/30/2017 If Site Rite image not attached, placement could not be confirmed due to current cardiac rhythm.    Assessment/Plan: Estimated body mass index is 23.62 kg/m as calculated from the following:   Height as of 10/28/17: 5\' 1"  (1.549 m).   Weight as of this encounter: 56.7 kg (125 lb).   84 year old female with multiple comorbidities he suffered a small right deep cerebellar hemorrhage, there is no shifting mass effect or intraventricular hemorrhage or hydrocephalus. This needs neurosurgical intervention. Stable over 2 CT scans. Neurology following. Please call if I can be of further assistance.   Maya Arcand S 10/30/2017 2:48 PM

## 2017-10-30 NOTE — Evaluation (Signed)
Occupational Therapy Evaluation Patient Details Name: [name] MRN: [id number] DOB: 1932-04-14 Today's Date: 10/30/2017    History of Present Illness 84 year old female with past medical history significant for multiple medical problems I.e. Hypertension, diabetes mellitus, history of TIA in the past, history of polycythemia vera, degenerative joint disease, osteoporosis, history of breast cancer, colon cancer, adenocarcinoma in situ of cervix, was admitted at [facility] because of acute onset of confusion and lethargy and generalized weakness and shortness of breath. underwent left cardiac catheterization at [facility] which showed three-vessel coronary artery disease with systolic dysfunction. CT which showed right cerebellar hemorrhage with IVH.   Clinical Impression   PTA, pt was living alone and was independent with ADLs and simple IADLs. Pt currently requiring Min A for UB ADLs, Max A for LB ADLs, and Min A for stand pivot transfer to recliner with RW. Pt presenting with poor activity tolerance and becomes fatigued and lethargic during ADLs and functional mobility. Pt with bowl incontinence at bedlevel and required Max A for toilet hygiene. Pt would benefit from further acute OT to facilitate safe dc. Recommend dc to SNF for further OT to optimize safety, independence with ADLs, and return to PLOF.      Follow Up Recommendations  SNF;Supervision/Assistance - 24 hour    Equipment Recommendations  Other (comment)(Defer to next venue)    Recommendations for Other Services PT consult;Speech consult     Precautions / Restrictions Precautions Precautions: Fall Restrictions Weight Bearing Restrictions: No      Mobility Bed Mobility Overal bed mobility: Needs Assistance Bed Mobility: Supine to Sit     Supine to sit: Min assist;HOB elevated     General bed mobility comments: Min A to bring RLE towards EOB and then elevate trunk    Transfers Overall transfer level: Needs assistance Equipment used: Rolling walker (2 wheeled) Transfers: Sit to/from Stand Sit to Stand: Mod assist         General transfer comment: Mod A to power up into standing. VCs for hand placement.    Balance Overall balance assessment: Needs assistance Sitting-balance support: No upper extremity supported;Feet supported Sitting balance-Leahy Scale: Fair     Standing balance support: Bilateral upper extremity supported;During functional activity Standing balance-Leahy Scale: Poor Standing balance comment: reliant on UE support and physical assist                           ADL either performed or assessed with clinical judgement   ADL Overall ADL's : Needs assistance/impaired Eating/Feeding: Set up;Sitting   Grooming: Wash/dry face;Set up;Sitting   Upper Body Bathing: Minimal assistance;Sitting   Lower Body Bathing: Maximal assistance;Sit to/from stand   Upper Body Dressing : Minimal assistance;Sitting   Lower Body Dressing: Maximal assistance;Sit to/from stand Lower Body Dressing Details (indicate cue type and reason): Max A to don socks at EOB. Pt able to don left sock with assistance to start task and then maintain left ankle on right knee. Toilet Transfer: Minimal assistance;+2 for safety/equipment;Stand-pivot;RW   Toileting- Clothing Manipulation and Hygiene: Maximal assistance;Sit to/from stand;+2 for physical assistance Toileting - Clothing Manipulation Details (indicate cue type and reason): Pt able to maintain standing balance with Min A for right lateral lean while second person performs toilet hygiene. Pt too fatigued to perform self peri care     Functional mobility during ADLs: Minimal assistance;Rolling walker(stand pivot only) General ADL Comments: Pt with decreased fucntional performance. Presenting with decreased activity tolerance,  balance, strength, and cognition.      Vision          Perception     Praxis      Pertinent Vitals/Pain Pain Assessment: No/denies pain     Hand Dominance Right   Extremity/Trunk Assessment Upper Extremity Assessment Upper Extremity Assessment: Generalized weakness;RUE deficits/detail RUE Deficits / Details: Pt reporting right sided weakness and decreased grasp in right hand. During testing, strength equal between left and right. Will further assess RUE Coordination: decreased gross motor;decreased fine motor   Lower Extremity Assessment Lower Extremity Assessment: Defer to PT evaluation   Cervical / Trunk Assessment Cervical / Trunk Assessment: Kyphotic   Communication Communication Communication: Expressive difficulties   Cognition Arousal/Alertness: Awake/alert;Lethargic Behavior During Therapy: WFL for tasks assessed/performed Overall Cognitive Status: Impaired/Different from baseline Area of Impairment: Attention;Following commands;Awareness                   Current Attention Level: Selective   Following Commands: Follows one step commands with increased time;Follows one step commands inconsistently   Awareness: Emergent   General Comments: Pt fatigues quickly during activity and becomes lethargic. When lying in bed, pt able to asnwer questions and participate convseration. During activity, pt requiring increased time to follow acutely and would close her eye.   General Comments  Niece present throughout session    Exercises     Shoulder Instructions      [place] expects to be discharged to:: Private residence Living Arrangements: Alone Available Help at Discharge: Family Type of Home: House Home Access: Stairs to enter Technical brewer of Steps: 2 Entrance Stairs-Rails: Left Home Layout: One level     Bathroom Shower/Tub: Occupational psychologist: Handicapped height     [place]: [place] - single point([place]ed a rollator)          Prior  Functioning/Environment Level of Independence: Independent        Comments: ADLs, IADLs, and driving. 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        OT Problem List: Decreased strength;Decreased range of motion;Decreased activity tolerance;Impaired balance (sitting and/or standing);Decreased cognition;Decreased safety awareness;Decreased knowledge of use of DME or AE;Decreased knowledge of precautions      OT Treatment/Interventions: Self-care/ADL training;Therapeutic exercise;Energy conservation;DME and/or AE instruction;Therapeutic activities;Patient/family education    OT Goals(Current goals can be found in the care plan section) Acute Rehab OT Goals Patient Stated Goal: Get better OT Goal Formulation: With patient/family Time For Goal Achievement: 11/13/17 Potential to Achieve Goals: Good ADL Goals Pt Will Perform Grooming: with min guard assist;standing Pt Will Perform Upper Body Dressing: with set-up;with supervision;sitting Pt Will Perform Lower Body Dressing: with min guard assist;sit to/from stand Pt Will Transfer to Toilet: with min guard assist;ambulating;bedside commode Pt Will Perform Toileting - Clothing Manipulation and hygiene: with min guard assist;sit to/from stand Additional ADL Goal #1: Pt will perform bed mobility at Mod I level with increased time in preparation for ADLs  OT Frequency: Min 2X/week   Barriers to D/C:            Co-evaluation              AM-PAC PT "6 Clicks" Daily Activity     Outcome Measure Help from another person eating meals?: A Little Help from another person taking care of personal grooming?: A Little Help from another person toileting, which includes using toliet, bedpan, or urinal?: A Lot Help from another person bathing (including washing, rinsing, drying)?: A Lot Help  from another person to put on and taking off regular upper body clothing?: A Lot Help from another person to put on and taking off regular lower body clothing?: A  Lot 6 Click Score: 14   End of Session Equipment Utilized During Treatment: Gait belt;Rolling walker Nurse Communication: Mobility status  Activity Tolerance: Patient limited by fatigue Patient left: in chair;with call bell/phone within reach;with family/visitor present;Other (comment)(With MD)  OT Visit Diagnosis: Unsteadiness on feet (R26.81);Other abnormalities of gait and mobility (R26.89);Muscle weakness (generalized) (M62.81);Other symptoms and signs involving cognitive function                Time: 1140-1210 OT Time Calculation (min): 30 min Charges:  OT General Charges $OT Visit: 1 Visit OT Evaluation $OT Eval Moderate Complexity: 1 Mod OT Treatments $Self Care/Home Management : 8-22 mins G-Codes:     Arti Trang MSOT, OTR/L Acute Rehab Pager: [phone] Office: [place] 10/30/2017, 1:47 PM

## 2017-10-30 NOTE — Progress Notes (Signed)
PT Cancellation Note  Patient Details Name: [name] MRN: [id number] DOB: 1932-04-05   Cancelled Treatment:    Reason Eval/Treat Not Completed: Patient declined, no reason specified;Patient at procedure or test/unavailable.  Pt back in bed, tired waiting for PICC placement.  Will defer evaluation until tomorrow as able. 10/30/2017  [name], [place] [phone]  (pager)   [name] 10/30/2017, 2:16 PM

## 2017-10-30 NOTE — Consult Note (Signed)
Name: [name] MRN: [id number] DOB: 1932-03-28    ADMISSION DATE:  10/29/2017 CONSULTATION DATE:  10/30/2017  REFERRING MD :  Dr. Lorraine Lax   CHIEF COMPLAINT:  Acute Hypoxic Respiratory Failure   HISTORY OF PRESENT ILLNESS:   84 year old female with PMH of Arthritis, Cervical and Endometrial Carcinoma, DM, Compression Fracture, CVA  Presents to ED on 5/09 with AMS. When EMS arrived patient was hypoxic with oxygen saturation in the 70s. Noted hematemesis. CTA with pleural effusions, opacities, and pericardial effusion. Troponin 6.88. Given IV Lasix and started on BiPAP. Antibiotics given concern for aspiration event.   Underwent Cardiac Cath on 5/10 and was found to have severe LV dysfunction with 3 vessel disease. Plans for High Risk PCI. Post-Cath CT Head Revealed right cerebellar bleed with surrounding edema with fourth ventricle extension. Transferred to [facility] for further management.   SIGNIFICANT EVENTS  5/09 > Presents to ED  5/10 > Cardiac Cath 5/11 > Transferred to [facility]   STUDIES:  Cardiac Cath 5/10 > Echocardiogram revealed LV function at 25% Cardiac catheterization revealed Ost Cx to Prox Cx lesion is 80% stenosed with 90% stenosed side branch in Ost 1st Mrg to 1st Mrg Mid LAD lesion is 95% stenosed Mid RCA lesion is 75% stenosed. Ost LAD to Prox LAD lesion is 70% stenosed. CT Head 5/10 > RIGHT cerebellar parenchymal hemorrhage approximately 31 x 16 x 19 mm. There is extension of hemorrhage into the fourth ventricle, without hydrocephalus. A hypertensive bleed is most likely, but unrecognized trauma, anticoagulation, vascular malformation, hemorrhagic metastatic neoplasm, are all considerations CT Head 5/10 > Redemonstrated is a RIGHT cerebellar hemorrhage, not significantly changed from priors, cross-section 15 x 29 x 19 mm. Extension into the IV ventricle without hydrocephalus. CXR 5/11 > Cardiomegaly with findings of CHF and small bilateral pleural effusions.  No pneumothorax.    PAST MEDICAL HISTORY :   has a past medical history of Adenocarcinoma in situ of cervix, Arthritis, Breast cancer ([place]), Colon cancer ([place]), Diabetes mellitus without complication ([place]), Endometrial carcinoma ([place]), H/O compression fracture of spine (2014), H/O polycythemia vera, H/O TIA (transient ischemic attack) and stroke (09/2014, 03/2015), Hypertension, Hyperuricemia, Microalbuminuria, Polycythemia vera ([place]), Recurrent falls, Skin cancer, Stroke ([employer]) (2008), Trochanteric bursitis, and Varicose veins.  has a past surgical history that includes Tonsillectomy; Exploratory laparotomy; Cataract extraction w/ intraocular lens implant (Right); Abdominal hysterectomy; Bowel resection (N/A, 03/28/2015); Cataract extraction w/PHACO (Left, 10/07/2015); Left Heart Cath (Right, 10/29/2017); and CORONARY ANGIOGRAPHY (N/A, 10/29/2017). Prior to Admission medications   Medication Sig Start Date End Date Taking? Authorizing Provider  alendronate (FOSAMAX) 70 MG tablet Take 70 mg by mouth once a week. Take with a full glass of water on an empty stomach.   Yes [provider]  calcium-vitamin D (OSCAL WITH D) 500-200 MG-UNIT per tablet Take 1 tablet by mouth daily.    Yes [provider]  carvedilol (COREG) 6.25 MG tablet Take 6.25 mg by mouth 2 (two) times daily with a meal.   Yes [provider]  chlorthalidone (HYGROTON) 25 MG tablet Take 25 mg by mouth daily.   Yes [provider]  clopidogrel (PLAVIX) 75 MG tablet Take 1 tablet (75 mg total) by mouth daily. 12/23/14  Yes Fritzi Mandes, MD  docusate sodium (COLACE) 250 MG capsule Take 250 mg by mouth daily as needed for constipation.   Yes [provider]  fexofenadine (ALLEGRA) 180 MG tablet Take 180 mg by mouth daily as needed.  Yes [provider]  hydroxyurea (HYDREA) 500 MG capsule TAKE 1 CAPSULE BY MOUTH ON MONDAY WEDNESDAY FRIDAY AND Saturday. Take  1 capsule every other Tuesday WITH  FOOD Patient taking differently: 500 mg. TAKE 1 CAPSULE BY MOUTH ON Sunday through Friday.  Do no take on Saturday 07/06/17  Yes Karen Kitchens, NP  loperamide (IMODIUM) 2 MG capsule Take 4 mg by mouth as needed for diarrhea or loose stools.   Yes [provider]  metFORMIN (GLUCOPHAGE) 1000 MG tablet Take 1,000 mg by mouth 2 (two) times daily.    Yes [provider]  pioglitazone (ACTOS) 15 MG tablet Take 15 mg by mouth daily. 03/16/17 03/16/18 Yes [provider]  polyethylene glycol (MIRALAX / GLYCOLAX) packet Take 17 g by mouth daily as needed.   Yes [provider]  potassium chloride (K-DUR,KLOR-CON) 10 MEQ tablet Take 10 mEq by mouth daily.   Yes [provider]  pravastatin (PRAVACHOL) 20 MG tablet Take 20 mg by mouth at bedtime.    Yes [provider]  Probiotic Product (PROBIOTIC DAILY PO) Take 1 tablet by mouth daily.   Yes [provider]  ramipril (ALTACE) 10 MG capsule Take 10 mg by mouth 2 (two) times daily.    Yes [provider]  verapamil (CALAN-SR) 240 MG CR tablet Take 240 mg by mouth daily.   Yes [provider]   Allergies  Allergen Reactions  . Simvastatin     Other reaction(s): Muscle Pain    FAMILY HISTORY:  family history includes Breast cancer in her mother; Cancer in her brother; Diabetes in her sister; Heart attack in her father, mother, and sister. SOCIAL HISTORY:  reports that she has quit smoking. She has never used smokeless tobacco. She reports that she does not drink alcohol or use drugs.  REVIEW OF SYSTEMS:   All negative; except for those that are bolded, which indicate positives.  Constitutional: weight loss, weight gain, night sweats, fevers, chills, fatigue, weakness.  HEENT: headaches, sore throat, sneezing, nasal congestion, post nasal drip, difficulty swallowing, tooth/dental problems, visual complaints, visual changes, ear aches. Neuro: difficulty with speech, weakness,  numbness, ataxia. CV:  chest pain, orthopnea, PND, swelling in lower extremities, dizziness, palpitations, syncope.  Resp: cough, hemoptysis, dyspnea, wheezing. GI: heartburn, indigestion, abdominal pain, nausea, vomiting, diarrhea, constipation, change in bowel habits, loss of appetite, hematemesis, melena, hematochezia.  GU: dysuria, change in color of urine, urgency or frequency, flank pain, hematuria. MSK: joint pain or swelling, decreased range of motion. Psych: change in mood or affect, depression, anxiety, suicidal ideations, homicidal ideations. Skin: rash, itching, bruising.   SUBJECTIVE:   VITAL SIGNS: Temp:  [97.9 F (36.6 C)-99.6 F (37.6 C)] 99 F (37.2 C) (05/11 0000) Pulse Rate:  [93-112] 93 (05/11 0100) Resp:  [13-28] 18 (05/11 0100) BP: (119-153)/(69-100) 119/69 (05/11 0100) SpO2:  [86 %-99 %] 96 % (05/11 0100) Weight:  [56.7 kg (125 lb)] 56.7 kg (125 lb) (05/10 1921)  PHYSICAL EXAMINATION: General:  Elderly Female, no distress  Neuro:  Awakens to verbal stimulation, follows commands  HEENT:  MMM Cardiovascular:  RRR, no MRG Lungs:  Crackles to bases, no wheeze  Abdomen:  Soft, non-tender, non-distended  Musculoskeletal: -edema  Skin:  Warm, dry, intact   Recent Labs  Lab 10/28/17 1504 10/29/17 0359 10/29/17 1726 10/29/17 2344  NA 138 140 139 140  K 3.5 3.8  --   --   CL 99* 104  --   --  CO2 25 25  --   --   BUN 25* 27*  --   --   CREATININE 0.87 1.11*  --   --   GLUCOSE 310* 228*  --   --    Recent Labs  Lab 10/25/17 1236 10/28/17 1504 10/29/17 0359  HGB 12.3 14.4 13.2  HCT 39.1 45.9 41.6  WBC 11.4* 26.1* 21.2*  PLT 453* 698* 454*   Dg Chest 1 View  Result Date: 10/28/2017 CLINICAL DATA:  Lethargy and weakness. EXAM: CHEST  1 VIEW COMPARISON:  Chest x-rays dated 03/23/2017 and 11/10/2012. FINDINGS: Cardiomegaly is stable. Atherosclerotic changes noted at the aortic arch. Bilateral perihilar opacities, presumed edema, LEFT greater than  RIGHT. No pleural effusion or pneumothorax seen. Osseous structures about the chest are unremarkable. IMPRESSION: 1. Cardiomegaly with new perihilar opacities, presumably pulmonary edema, suggesting CHF/volume overload. Pneumonia is considered less likely. 2. Aortic atherosclerosis. Electronically Signed   By: Franki Cabot M.D.   On: 10/28/2017 15:48   Ct Head Wo Contrast  Result Date: 10/29/2017 CLINICAL DATA:  Continued surveillance intracranial hemorrhage. Transfer to different facility. EXAM: CT HEAD WITHOUT CONTRAST TECHNIQUE: Contiguous axial images were obtained from the base of the skull through the vertex without intravenous contrast. COMPARISON:  CT head earlier in the day. FINDINGS: Brain: Redemonstrated is a RIGHT cerebellar hemorrhage, not significantly changed from priors, cross-section 15 x 29 x 19 mm. Extension into the IV ventricle without hydrocephalus. Vascular: Calcification of the cavernous internal carotid arteries consistent with cerebrovascular atherosclerotic disease. No signs of intracranial large vessel occlusion. Blood pool affect is decreased, related to intravascular contrast from prior cardiac catheterization. Skull: Normal. Negative for fracture or focal lesion. Sinuses/Orbits: No significant paranasal sinus disease. Negative orbits. Other: None. IMPRESSION: Stable RIGHT cerebellar hemorrhage, with extension into the IV ventricle without hydrocephalus. Electronically Signed   By: Staci Righter M.D.   On: 10/29/2017 23:05   Ct Head Wo Contrast  Result Date: 10/29/2017 CLINICAL DATA:  Inpatient with acute encephalopathy. Respiratory failure. EXAM: CT HEAD WITHOUT CONTRAST TECHNIQUE: Contiguous axial images were obtained from the base of the skull through the vertex without intravenous contrast. COMPARISON:  CT head 03/30/2015. MR head 03/29/2015. FINDINGS: Brain: There is a RIGHT cerebellar parenchymal hemorrhage, with intraventricular extension. Measurements 31 x 16 x 19 mm,  corresponding to a volume of approximately 5 mL. Blood extends from the fourth ventricle into the BILATERAL foramina of [place]. There is no hydrocephalus. Generalized atrophy is present. There is no acute infarct. Extensive hypoattenuation of white matter representing small vessel disease. No extra-axial hematoma. Vascular: No hyperdense vessel. Skull: Calvarium intact. Sinuses/Orbits: Mild sinus disease.  Negative orbits. Other: None. Compared with priors, no abnormality was seen in this location. IMPRESSION: RIGHT cerebellar parenchymal hemorrhage approximately 31 x 16 x 19 mm. There is extension of hemorrhage into the fourth ventricle, without hydrocephalus. A hypertensive bleed is most likely, but unrecognized trauma, anticoagulation, vascular malformation, hemorrhagic metastatic neoplasm, are all considerations. Critical Value/emergent results were called by telephone at the time of interpretation on 10/29/2017 at 4:23 pm to Dr. [name] , who verbally acknowledged these results. Electronically Signed   By: Staci Righter M.D.   On: 10/29/2017 16:23   Ct Angio Chest Pe W And/or Wo Contrast  Result Date: 10/28/2017 CLINICAL DATA:  Patient presents to ED via [facility] from home (patient lives alone). Patients caregiver found her today in bed, alert and oriented but very lethargic and weak. Upon EMS arrival SpO2 73% on RA. EXAM:  CT ANGIOGRAPHY CHEST WITH CONTRAST TECHNIQUE: Multidetector CT imaging of the chest was performed using the standard protocol during bolus administration of intravenous contrast. Multiplanar CT image reconstructions and MIPs were obtained to evaluate the vascular anatomy. CONTRAST:  65mL ISOVUE-370 IOPAMIDOL (ISOVUE-370) INJECTION 76% COMPARISON:  Chest x-ray 10/28/2017 FINDINGS: Cardiovascular: There is small pericardial effusion measuring maximum thickness of 1.0 centimeters. The heart is enlarged. There is significant coronary artery calcification. There is atherosclerotic  calcification of the thoracic aorta thoracic aorta measures 3.1 centimeters. Descending aorta is 3.8 centimeters and is stable in appearance. No displaced intimal calcifications or evidence for dissection. Mediastinum/Nodes: The visualized portion of the thyroid gland has a normal appearance. Esophagus is normal in appearance. No significant adenopathy. Lungs/Pleura: There are small bilateral pleural effusions. There is a stable irregular density at the LEFT lung apex, measuring 8 millimeters in diameter. There is smooth septal wall thickening. There are patchy areas of ground-glass density, primarily with a dependent distribution. No suspicious pulmonary nodules. No focal areas of consolidation. Upper Abdomen: There is atherosclerotic calcification of the abdominal aorta. Musculoskeletal: There are numerous compression fractures of the thoracic spine, stable in appearance. Review of the MIP images confirms the above findings. IMPRESSION: 1. Pericardial effusion and cardiomegaly.  Coronary artery disease. 2. Parenchymal changes consistent with pulmonary edema and small pleural effusions. 3. 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 Recommend annual imaging followup by CTA or MRA. This recommendation follows 2010 ACCF/AHA/AATS/ACR/ASA/SCA/SCAI/SIR/STS/SVM Guidelines for the Diagnosis and Management of Patients with Thoracic Aortic Disease. Circulation.2010; 121: L875-I433 4. Aortic aneurysm NOS (ICD10-I71.9). Aortic Atherosclerosis (ICD10-I70.0). 5. Stable probable scar at the LEFT lung apex. 6. Numerous thoracic compression fractures. Electronically Signed   By: Nolon Nations M.D.   On: 10/28/2017 17:32    ASSESSMENT / PLAN:  Acute Hypoxic Respiratory Failure with concern for aspiration event vs pulmonary edema with decompensated heart failure  -interstitial edema and small bilateral pleural effusions noted on CXR with no infiltrates  Plan    -Maintain Oxygen Saturation >92 -Pulmonary Hygiene  -Diuresis as below   Systolic Heart Failure with EF 25% and 3 Vessel Disease  NSTEMI  Plan  -Cardiology Consulted  -BNP pending  -Lasix 40 meq q8h -Hold Anticoagulation due to IVH -Wean Cardene to Maintain Systolic <295   Leucocytosis ? Reactive  Plan  -Trend CBC  -Trend PCT  -Follow Culture Data > Send Sputum if able  -Hold antibiotics at this time as clinical picture points towards pulmonary edema   Right Cerebellar Hemorrhage with IVH with surrounding edema H/O CVA Plan  -Per Neurology  -Hypertonic Saline with NA goal 150-160   Hayden Pedro, AGACNP-BC [facility]  Pgr: [phone]  PCCM Pgr: [phone]

## 2017-10-30 NOTE — Progress Notes (Signed)
OT Cancellation Note  Patient Details Name: [name] MRN: [id number] DOB: Dec 12, 1931   Cancelled Treatment:    Reason Eval/Treat Not Completed: Active bedrest order. Will return as schedule allows. Thank you.  [place], OTR/L Acute Rehab Pager: [phone] Office: [phone] 10/30/2017, 11:31 AM

## 2017-10-30 NOTE — Consult Note (Signed)
Reason for Consult:multivessel CAD/decompensated systolic congestive heart failure Referring Physician: Dr. Deatra Robinson [name] is an 84 y.o. female.  MPN:TIRWERX is 84 year old female with past medical history significant for multiple medical problems I.e. Hypertension, diabetes mellitus, history of TIA in the past, history of polycythemia vera, degenerative joint disease, osteoporosis, history of breast cancer, colon cancer, adenocarcinoma in situ of cervix, was admitted at [facility] because of acute onset of confusion and lethargy and generalized weakness and shortness of breath. Patient ruled in for non-Q-wave myocardial infarction and subsequently underwent left cardiac catheterization at [facility] which showed three-vessel coronary artery disease with systolic dysfunction CVTS consultation was called for possible CABG but was felt not the good candidate for surgery due to multiple comorbidities . I was contacted for possible high risk PCI using LV support and rotablation but unfortunately patient had episode of confusion subsequently had CT which showed right cerebellar hemorrhage with IVH. As per her niece patient was active working in the yard prior to this episode. Patient presently awake with some slurring of speech up in chair. States her breathing has improved patient denies any chest pain but states had episodes of diaphoresis and generalized weakness in the past.  Past Medical History:  Diagnosis Date  . Adenocarcinoma in situ of cervix   . Arthritis    hands  . Breast cancer ([place])   . Colon cancer ([place])   . Diabetes mellitus without complication ([place])   . Endometrial carcinoma ([facility])    s/p total abdominal hysterectomy  . H/O compression fracture of spine 2014   thoracic spine  . H/O polycythemia vera   . H/O TIA (transient ischemic attack) and stroke 09/2014, 03/2015   No deficits  . Hypertension   . Hyperuricemia   . Microalbuminuria   .  Polycythemia vera ([place])   . Recurrent falls   . Skin cancer    face, legs  . Stroke [facility]) 2008   no deficits  . Trochanteric bursitis   . Varicose veins    treated    Past Surgical History:  Procedure Laterality Date  . ABDOMINAL HYSTERECTOMY    . BOWEL RESECTION N/A 03/28/2015   Procedure: SMALL BOWEL RESECTION;  Surgeon: Leonie Green, MD;  Location: [facility] ORS;  Service: General;  Laterality: N/A;  . CATARACT EXTRACTION W/ INTRAOCULAR LENS IMPLANT Right   . CATARACT EXTRACTION W/PHACO Left 10/07/2015   Procedure: CATARACT EXTRACTION PHACO AND INTRAOCULAR LENS PLACEMENT (IOC);  Surgeon: Ronnell Freshwater, MD;  Location: [place];  Service: Ophthalmology;  Laterality: Left;  DIABETIC - oral meds VISION BLUE  . CORONARY ANGIOGRAPHY N/A 10/29/2017   Procedure: CORONARY ANGIOGRAPHY;  Surgeon: Dionisio David, MD;  Location: [place] CV LAB;  Service: Cardiovascular;  Laterality: N/A;  . EXPLORATORY LAPAROTOMY     for fibroids  . LEFT HEART CATH Right 10/29/2017   Procedure: Left Heart Cath;  Surgeon: Dionisio David, MD;  Location: [place] CV LAB;  Service: Cardiovascular;  Laterality: Right;  . TONSILLECTOMY      Family History  Problem Relation Age of Onset  . Cancer Brother        AML  . Heart attack Mother   . Breast cancer Mother   . Heart attack Father   . Heart attack Sister   . Diabetes Sister     Social History:  reports that she has quit smoking. She has never used smokeless tobacco. She reports that she does not drink  alcohol or use drugs.  Allergies:  Allergies  Allergen Reactions  . Simvastatin     Other reaction(s): Muscle Pain    Medications: I have reviewed the patient's current medications.  Results for orders placed or performed during the hospital encounter of 10/29/17 (from the past 48 hour(s))  Sodium     Status: None   Collection Time: 10/29/17 11:44 PM  Result Value Ref Range   Sodium 140 135 - 145 mmol/L     Comment: Performed at [facility], [place] [address], [place], [place] [address]  Procalcitonin - Baseline     Status: None   Collection Time: 10/30/17  2:49 AM  Result Value Ref Range   Procalcitonin 0.26 ng/mL    Comment:        Interpretation: PCT (Procalcitonin) <= 0.5 ng/mL: Systemic infection (sepsis) is not likely. Local bacterial infection is possible. (NOTE)       Sepsis PCT Algorithm           Lower Respiratory Tract                                      Infection PCT Algorithm    ----------------------------     ----------------------------         PCT < 0.25 ng/mL                PCT < 0.10 ng/mL         Strongly encourage             Strongly discourage   discontinuation of antibiotics    initiation of antibiotics    ----------------------------     -----------------------------       PCT 0.25 - 0.50 ng/mL            PCT 0.10 - 0.25 ng/mL               OR       >80% decrease in PCT            Discourage initiation of                                            antibiotics      Encourage discontinuation           of antibiotics    ----------------------------     -----------------------------         PCT >= 0.50 ng/mL              PCT 0.26 - 0.50 ng/mL               AND        <80% decrease in PCT             Encourage initiation of                                             antibiotics       Encourage continuation           of antibiotics    ----------------------------     -----------------------------        PCT >= 0.50 ng/mL  PCT > 0.50 ng/mL               AND         increase in PCT                  Strongly encourage                                      initiation of antibiotics    Strongly encourage escalation           of antibiotics                                     -----------------------------                                           PCT <= 0.25 ng/mL                                                 OR                                         > 80% decrease in PCT                                     Discontinue / Do not initiate                                             antibiotics Performed at [facility], [address], [place], [place] [address]   CBC     Status: Abnormal   Collection Time: 10/30/17  2:49 AM  Result Value Ref Range   WBC 18.3 (H) 4.0 - 10.5 K/uL   RBC 4.71 3.87 - 5.11 MIL/uL   Hemoglobin 12.5 12.0 - 15.0 g/dL   HCT 40.9 36.0 - 46.0 %   MCV 86.8 78.0 - 100.0 fL   MCH 26.5 26.0 - 34.0 pg   MCHC 30.6 30.0 - 36.0 g/dL   RDW 18.5 (H) 11.5 - 15.5 %   Platelets 368 150 - 400 K/uL    Comment: Performed at [facility], [place] [address], [place], [place] [address]  Basic metabolic panel     Status: Abnormal   Collection Time: 10/30/17  2:49 AM  Result Value Ref Range   Sodium 143 135 - 145 mmol/L   Potassium 2.8 (L) 3.5 - 5.1 mmol/L   Chloride 104 101 - 111 mmol/L   CO2 26 22 - 32 mmol/L   Glucose, Bld 229 (H) 65 - 99 mg/dL   BUN 26 (H) 6 - 20 mg/dL   Creatinine, Ser 1.21 (H) 0.44 - 1.00 mg/dL   Calcium 8.3 (L) 8.9 - 10.3 mg/dL   GFR calc non Af Amer 40 (L) >60  mL/min   GFR calc Af Amer 47 (L) >60 mL/min    Comment: (NOTE) The eGFR has been calculated using the CKD EPI equation. This calculation has not been validated in all clinical situations. eGFR's persistently <60 mL/min signify possible Chronic Kidney Disease.    Anion gap 13 5 - 15    Comment: Performed at [facility] [address] [place], [place] [address]  Magnesium     Status: Abnormal   Collection Time: 10/30/17  2:49 AM  Result Value Ref Range   Magnesium 1.3 (L) 1.7 - 2.4 mg/dL    Comment: Performed at [facility] [address], [place], [place] [address]  Phosphorus     Status: None   Collection Time: 10/30/17  2:49 AM  Result Value Ref Range   Phosphorus 3.4 2.5 - 4.6 mg/dL    Comment: Performed at [facility] [address], [place], [place] [address]  Brain natriuretic peptide      Status: Abnormal   Collection Time: 10/30/17  3:01 AM  Result Value Ref Range   B Natriuretic Peptide >4,500.0 (H) 0.0 - 100.0 pg/mL    Comment: Performed at [facility] [address], [place], [place] [address]  Glucose, capillary     Status: Abnormal   Collection Time: 10/30/17  7:29 AM  Result Value Ref Range   Glucose-Capillary 259 (H) 65 - 99 mg/dL  Sodium     Status: Abnormal   Collection Time: 10/30/17 11:15 AM  Result Value Ref Range   Sodium 146 (H) 135 - 145 mmol/L    Comment: Performed at [facility], [place] [address] [place], [place] [address]  Glucose, capillary     Status: Abnormal   Collection Time: 10/30/17 12:17 PM  Result Value Ref Range   Glucose-Capillary 179 (H) 65 - 99 mg/dL    Dg Chest 1 View  Result Date: 10/28/2017 CLINICAL DATA:  Lethargy and weakness. EXAM: CHEST  1 VIEW COMPARISON:  Chest x-rays dated 03/23/2017 and 11/10/2012. FINDINGS: Cardiomegaly is stable. Atherosclerotic changes noted at the aortic arch. Bilateral perihilar opacities, presumed edema, LEFT greater than RIGHT. No pleural effusion or pneumothorax seen. Osseous structures about the chest are unremarkable. IMPRESSION: 1. Cardiomegaly with new perihilar opacities, presumably pulmonary edema, suggesting CHF/volume overload. Pneumonia is considered less likely. 2. Aortic atherosclerosis. Electronically Signed   By: Franki Cabot M.D.   On: 10/28/2017 15:48   Ct Head Wo Contrast  Result Date: 10/29/2017 CLINICAL DATA:  Continued surveillance intracranial hemorrhage. Transfer to different facility. EXAM: CT HEAD WITHOUT CONTRAST TECHNIQUE: Contiguous axial images were obtained from the base of the skull through the vertex without intravenous contrast. COMPARISON:  CT head earlier in the day. FINDINGS: Brain: Redemonstrated is a RIGHT cerebellar hemorrhage, not significantly changed from priors, cross-section 15 x 29 x 19 mm. Extension into the IV ventricle without hydrocephalus.  Vascular: Calcification of the cavernous internal carotid arteries consistent with cerebrovascular atherosclerotic disease. No signs of intracranial large vessel occlusion. Blood pool affect is decreased, related to intravascular contrast from prior cardiac catheterization. Skull: Normal. Negative for fracture or focal lesion. Sinuses/Orbits: No significant paranasal sinus disease. Negative orbits. Other: None. IMPRESSION: Stable RIGHT cerebellar hemorrhage, with extension into the IV ventricle without hydrocephalus. Electronically Signed   By: Staci Righter M.D.   On: 10/29/2017 23:05   Ct Head Wo Contrast  Result Date: 10/29/2017 CLINICAL DATA:  Inpatient with acute encephalopathy. Respiratory failure. EXAM: CT  HEAD WITHOUT CONTRAST TECHNIQUE: Contiguous axial images were obtained from the base of the skull through the vertex without intravenous contrast. COMPARISON:  CT head 03/30/2015. MR head 03/29/2015. FINDINGS: Brain: There is a RIGHT cerebellar parenchymal hemorrhage, with intraventricular extension. Measurements 31 x 16 x 19 mm, corresponding to a volume of approximately 5 mL. Blood extends from the fourth ventricle into the BILATERAL foramina of [place]. There is no hydrocephalus. Generalized atrophy is present. There is no acute infarct. Extensive hypoattenuation of white matter representing small vessel disease. No extra-axial hematoma. Vascular: No hyperdense vessel. Skull: Calvarium intact. Sinuses/Orbits: Mild sinus disease.  Negative orbits. Other: None. Compared with priors, no abnormality was seen in this location. IMPRESSION: RIGHT cerebellar parenchymal hemorrhage approximately 31 x 16 x 19 mm. There is extension of hemorrhage into the fourth ventricle, without hydrocephalus. A hypertensive bleed is most likely, but unrecognized trauma, anticoagulation, vascular malformation, hemorrhagic metastatic neoplasm, are all considerations. Critical Value/emergent results were called by telephone at  the time of interpretation on 10/29/2017 at 4:23 pm to Dr. [name] , who verbally acknowledged these results. Electronically Signed   By: Staci Righter M.D.   On: 10/29/2017 16:23   Ct Angio Chest Pe W And/or Wo Contrast  Result Date: 10/28/2017 CLINICAL DATA:  Patient presents to ED via [facility] from home (patient lives alone). Patients caregiver found her today in bed, alert and oriented but very lethargic and weak. Upon EMS arrival SpO2 73% on RA. EXAM: CT ANGIOGRAPHY CHEST WITH CONTRAST TECHNIQUE: Multidetector CT imaging of the chest was performed using the standard protocol during bolus administration of intravenous contrast. Multiplanar CT image reconstructions and MIPs were obtained to evaluate the vascular anatomy. CONTRAST:  9m ISOVUE-370 IOPAMIDOL (ISOVUE-370) INJECTION 76% COMPARISON:  Chest x-ray 10/28/2017 FINDINGS: Cardiovascular: There is small pericardial effusion measuring maximum thickness of 1.0 centimeters. The heart is enlarged. There is significant coronary artery calcification. There is atherosclerotic calcification of the thoracic aorta thoracic aorta measures 3.1 centimeters. Descending aorta is 3.8 centimeters and is stable in appearance. No displaced intimal calcifications or evidence for dissection. Mediastinum/Nodes: The visualized portion of the thyroid gland has a normal appearance. Esophagus is normal in appearance. No significant adenopathy. Lungs/Pleura: There are small bilateral pleural effusions. There is a stable irregular density at the LEFT lung apex, measuring 8 millimeters in diameter. There is smooth septal wall thickening. There are patchy areas of ground-glass density, primarily with a dependent distribution. No suspicious pulmonary nodules. No focal areas of consolidation. Upper Abdomen: There is atherosclerotic calcification of the abdominal aorta. Musculoskeletal: There are numerous compression fractures of the thoracic spine, stable in appearance. Review of the  MIP images confirms the above findings. IMPRESSION: 1. Pericardial effusion and cardiomegaly.  Coronary artery disease. 2. Parenchymal changes consistent with pulmonary edema and small pleural effusions. 3. 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 Recommend annual imaging followup by CTA or MRA. This recommendation follows 2010 ACCF/AHA/AATS/ACR/ASA/SCA/SCAI/SIR/STS/SVM Guidelines for the Diagnosis and Management of Patients with Thoracic Aortic Disease. Circulation.2010; 121: [id number]. Aortic aneurysm NOS (ICD10-I71.9). Aortic Atherosclerosis (ICD10-I70.0). 5. Stable probable scar at the LEFT lung apex. 6. Numerous thoracic compression fractures. Electronically Signed   By: ENolon NationsM.D.   On: 10/28/2017 17:32   Dg Chest Port 1 View  Result Date: 10/30/2017 CLINICAL DATA:  84year old female with acute respiratory failure. EXAM: PORTABLE CHEST 1 VIEW COMPARISON:  Chest CT dated 10/28/2017 FINDINGS: There is cardiomegaly, mild interstitial edema, and small bilateral  pleural effusions. Mildly prominent pulmonary artery may represent a degree of pulmonary hypertension. No pneumothorax. There is atherosclerotic calcification of the aorta. No acute osseous pathology. IMPRESSION: Cardiomegaly with findings of CHF and small bilateral pleural effusions. No pneumothorax. Electronically Signed   By: Anner Crete M.D.   On: 10/30/2017 02:28   [place] Ekg Site Rite  Result Date: 10/30/2017 If Site Rite image not attached, placement could not be confirmed due to current cardiac rhythm.   Review of Systems  Constitutional: Negative for chills and fever.  HENT: Negative for hearing loss.   Respiratory: Positive for shortness of breath.   Cardiovascular: Negative for chest pain, palpitations and orthopnea.  Gastrointestinal: Positive for nausea and vomiting.  Genitourinary: Negative for dysuria.   Blood pressure 127/89, pulse 88, temperature  98.1 F (36.7 C), temperature source Oral, resp. rate (!) 22, weight 56.7 kg (125 lb), SpO2 99 %. Physical Exam  Constitutional: She is oriented to person, place, and time.  Eyes: Pupils are equal, round, and reactive to light. Conjunctivae are normal. Left eye exhibits no discharge. No scleral icterus.  Neck: Normal range of motion. Neck supple. No JVD present. No tracheal deviation present. No thyromegaly present.  Cardiovascular: Normal rate and regular rhythm.  Murmur (2/6 systolic murmur and S3 gallop noted) heard. Respiratory:  Decreased breath sound at bases with bilateral rales noted  GI: Soft. Bowel sounds are normal.  Musculoskeletal: She exhibits no edema, tenderness or deformity.  Neurological: She is alert and oriented to person, place, and time.    Assessment/Plan: Status post acute anteroseptal wall MI with atypical presentation Multivessel calcific 3 vessel disease Decompensated systolic congestive heart failure Acute right cerebellar bleed with extension to IVH Hypertension Diabetes mellitus History of polycythemia and History of TIA in the past Osteoporosis History of CA of:, Breast cancer, adenocarcinoma in situ of cervix. Plan Patient is known to candidate for any coronary intervention due to internal cranial bleed We'll start Coreg as per orders Agree with IV Lasix Replace K We'll start Entresto from tomorrow if renal function is stable Check labs and lipid panel Add statin Discussed with patient and family  agree medical management for now Charolette Forward 10/30/2017, 12:25 PM

## 2017-10-31 ENCOUNTER — Inpatient Hospital Stay (HOSPITAL_COMMUNITY): Payer: Medicare Other

## 2017-10-31 LAB — BLOOD GAS, VENOUS
Acid-base deficit: 1.3 mmol/L (ref 0.0–2.0)
Bicarbonate: 25.7 mmol/L (ref 20.0–28.0)
O2 Saturation: 67.7 %
Patient temperature: 37
pCO2, Ven: 51 mmHg (ref 44.0–60.0)
pH, Ven: 7.31 (ref 7.250–7.430)
pO2, Ven: 39 mmHg (ref 32.0–45.0)

## 2017-10-31 LAB — BASIC METABOLIC PANEL
Anion gap: 12 (ref 5–15)
BUN: 20 mg/dL (ref 6–20)
CO2: 28 mmol/L (ref 22–32)
Calcium: 8.1 mg/dL — ABNORMAL LOW (ref 8.9–10.3)
Chloride: 108 mmol/L (ref 101–111)
Creatinine, Ser: 1.17 mg/dL — ABNORMAL HIGH (ref 0.44–1.00)
GFR calc Af Amer: 49 mL/min — ABNORMAL LOW (ref >60)
GFR calc non Af Amer: 42 mL/min — ABNORMAL LOW (ref >60)
Glucose, Bld: 241 mg/dL — ABNORMAL HIGH (ref 65–99)
Potassium: 3 mmol/L — ABNORMAL LOW (ref 3.5–5.1)
Sodium: 148 mmol/L — ABNORMAL HIGH (ref 135–145)

## 2017-10-31 LAB — CBC
HCT: 39.9 % (ref 36.0–46.0)
Hemoglobin: 12 g/dL (ref 12.0–15.0)
MCH: 26.6 pg (ref 26.0–34.0)
MCHC: 30.1 g/dL (ref 30.0–36.0)
MCV: 88.5 fL (ref 78.0–100.0)
Platelets: 333 10*3/uL (ref 150–400)
RBC: 4.51 MIL/uL (ref 3.87–5.11)
RDW: 18.6 % — ABNORMAL HIGH (ref 11.5–15.5)
WBC: 12.9 10*3/uL — ABNORMAL HIGH (ref 4.0–10.5)

## 2017-10-31 LAB — GLUCOSE, CAPILLARY
Glucose-Capillary: 188 mg/dL — ABNORMAL HIGH (ref 65–99)
Glucose-Capillary: 215 mg/dL — ABNORMAL HIGH (ref 65–99)
Glucose-Capillary: 215 mg/dL — ABNORMAL HIGH (ref 65–99)
Glucose-Capillary: 218 mg/dL — ABNORMAL HIGH (ref 65–99)
Glucose-Capillary: 242 mg/dL — ABNORMAL HIGH (ref 65–99)

## 2017-10-31 LAB — MAGNESIUM: Magnesium: 2 mg/dL (ref 1.7–2.4)

## 2017-10-31 LAB — SODIUM
Sodium: 147 mmol/L — ABNORMAL HIGH (ref 135–145)
Sodium: 147 mmol/L — ABNORMAL HIGH (ref 135–145)
Sodium: 147 mmol/L — ABNORMAL HIGH (ref 135–145)

## 2017-10-31 LAB — TROPONIN I: Troponin I: 2.57 ng/mL (ref <0.03)

## 2017-10-31 MED ORDER — FUROSEMIDE 10 MG/ML IJ SOLN
INTRAMUSCULAR | Status: AC
  Filled 2017-10-31: qty 4

## 2017-10-31 MED ORDER — FUROSEMIDE 10 MG/ML IJ SOLN
40.0000 mg | Freq: Two times a day (BID) | INTRAMUSCULAR | Status: DC
  Administered 2017-11-01 – 2017-11-04 (×7): 40 mg via INTRAVENOUS
  Filled 2017-10-31 (×7): qty 4

## 2017-10-31 MED ORDER — FUROSEMIDE 10 MG/ML IJ SOLN
40.0000 mg | Freq: Once | INTRAMUSCULAR | Status: AC
  Administered 2017-10-31: 40 mg via INTRAVENOUS

## 2017-10-31 MED ORDER — RAMIPRIL 5 MG PO CAPS
5.0000 mg | ORAL_CAPSULE | Freq: Two times a day (BID) | ORAL | Status: DC
Start: 2017-10-31 — End: 2017-11-01
  Administered 2017-10-31 – 2017-11-01 (×3): 5 mg via ORAL
  Filled 2017-10-31 (×4): qty 1

## 2017-10-31 MED ORDER — POTASSIUM CHLORIDE CRYS ER 20 MEQ PO TBCR
20.0000 meq | EXTENDED_RELEASE_TABLET | Freq: Two times a day (BID) | ORAL | Status: DC
  Administered 2017-10-31 – 2017-11-01 (×3): 20 meq via ORAL
  Filled 2017-10-31 (×3): qty 1

## 2017-10-31 MED ORDER — POTASSIUM CHLORIDE CRYS ER 20 MEQ PO TBCR
40.0000 meq | EXTENDED_RELEASE_TABLET | Freq: Once | ORAL | Status: AC
  Administered 2017-10-31: 40 meq via ORAL
  Filled 2017-10-31: qty 2

## 2017-10-31 MED ORDER — FUROSEMIDE 10 MG/ML IJ SOLN: 40.0000 mg | Freq: Every day | INTRAMUSCULAR | Status: DC

## 2017-10-31 MED ORDER — CARVEDILOL 12.5 MG PO TABS
12.5000 mg | ORAL_TABLET | Freq: Two times a day (BID) | ORAL | Status: DC
  Administered 2017-10-31 – 2017-11-04 (×8): 12.5 mg via ORAL
  Filled 2017-10-31 (×9): qty 1

## 2017-10-31 MED ORDER — PANTOPRAZOLE SODIUM 40 MG PO TBEC
40.0000 mg | DELAYED_RELEASE_TABLET | Freq: Every day | ORAL | Status: DC
  Administered 2017-10-31 – 2017-11-04 (×5): 40 mg via ORAL
  Filled 2017-10-31 (×5): qty 1

## 2017-10-31 MED ORDER — POTASSIUM CHLORIDE CRYS ER 20 MEQ PO TBCR
20.0000 meq | EXTENDED_RELEASE_TABLET | Freq: Every day | ORAL | Status: DC
  Administered 2017-10-31: 20 meq via ORAL
  Filled 2017-10-31: qty 1

## 2017-10-31 NOTE — Progress Notes (Signed)
STROKE TEAM PROGRESS NOTE   HISTORY OF PRESENT ILLNESS (per record) [name] is an 84 y.o. female with a PMH of Stroke, Recurrent Falls, Polycythemia Vera, Microalbuminuria, HTN, TIA, Endometrial Carcinoma, Diabetes Mellitus, Colon Cancer, Breast Cancer, Arthritis, and Adenocarcinoma in Situ of Cervix presented to [facility] hospital with black emesis and unresponsive 2/2 hypoxia to [facility] on 10/28/17.   CTA Chest revealed pericardial effusion, small pleural effusions, and pulmonary edema. She was treated for sepsis secondary to pneumonia. She is on ASA and Plavix.  She was noted to have NSTEMI and underwent cardiac cath which showed 3 vessel disease and severe LV dysfunction.  Following cath procedure patient had a head CT which showed right cerebellar hemorrhage with IVH. She was supposed to receive heparin drip, but was held. Patient transferred to [facility] hospital for further management.    Date last known well:  5.9.19 Time last known well: unclear tPA Given: no, IVH NIHSS: 1 Baseline MRS 0     SUBJECTIVE (INTERVAL HISTORY) No family at bedside, sleeping comfortable in bed but woke up to voice, BP little elevated 150/80 but ready to get her morning meds, no headaches or any pain reported.    OBJECTIVE Temp:  [96.5 F (35.8 C)-98.1 F (36.7 C)] 98 F (36.7 C) (05/12 0352) Pulse Rate:  [74-99] 74 (05/12 0700) Cardiac Rhythm: Normal sinus rhythm (05/11 2000) Resp:  [13-23] 16 (05/12 0700) BP: (102-149)/(59-100) 146/94 (05/12 0700) SpO2:  [97 %-100 %] 100 % (05/12 0700)  CBC:  Recent Labs  Lab 10/25/17 1236 10/28/17 1504  10/30/17 0249 10/31/17 0621  WBC 11.4* 26.1*   < > 18.3* 12.9*  NEUTROABS 9.3* 21.4*  --   --   --   HGB 12.3 14.4   < > 12.5 12.0  HCT 39.1 45.9   < > 40.9 39.9  MCV 85.2 86.1   < > 86.8 88.5  PLT 453* 698*   < > 368 333   < > = values in this interval not displayed.    Basic Metabolic Panel:  Recent Labs  Lab 10/30/17 0249  10/31/17 0119  10/31/17 0621  NA 143   < > 147* 148*  K 2.8*  --   --  3.0*  CL 104  --   --  108  CO2 26  --   --  28  GLUCOSE 229*  --   --  241*  BUN 26*  --   --  20  CREATININE 1.21*  --   --  1.17*  CALCIUM 8.3*  --   --  8.1*  MG 1.3*  --   --  2.0  PHOS 3.4  --   --   --    < > = values in this interval not displayed.    Lipid Panel:     Component Value Date/Time   CHOL 148 12/20/2014 1533   TRIG 130 12/20/2014 1533   HDL 44 12/20/2014 1533   CHOLHDL 3.4 12/20/2014 1533   VLDL 26 12/20/2014 1533   LDLCALC 78 12/20/2014 1533   HgbA1c:  Lab Results  Component Value Date   HGBA1C 7.0 (H) 03/29/2015   Urine Drug Screen:     Component Value Date/Time   LABOPIA NONE DETECTED 12/20/2014 1639   COCAINSCRNUR NONE DETECTED 12/20/2014 1639   LABBENZ NONE DETECTED 12/20/2014 1639   AMPHETMU NONE DETECTED 12/20/2014 1639   THCU NONE DETECTED 12/20/2014 1639   LABBARB NONE DETECTED 12/20/2014 1639    Alcohol Level  Component Value Date/Time   [facility] <5 12/20/2014 1533    IMAGING   Dg Chest 1 View 10/28/2017 IMPRESSION:  1. Cardiomegaly with new perihilar opacities, presumably pulmonary edema, suggesting CHF/volume overload. Pneumonia is considered less likely.  2. Aortic atherosclerosis.     Ct Head Wo Contrast 10/29/2017 IMPRESSION:  Stable RIGHT cerebellar hemorrhage, with extension into the IV ventricle without hydrocephalus.     Ct Head Wo Contrast 10/29/2017 IMPRESSION:  RIGHT cerebellar parenchymal hemorrhage approximately 31 x 16 x 19 mm. There is extension of hemorrhage into the fourth ventricle, without hydrocephalus. A hypertensive bleed is most likely, but unrecognized trauma, anticoagulation, vascular malformation, hemorrhagic metastatic neoplasm, are all considerations.    Ct Head Wo Contrast 10/31/2017 IMPRESSION:  Stable RIGHT cerebellar parenchymal hemorrhage, IVH with no hydrocephalus   Ct Angio Chest Pe W And/or Wo Contrast 10/28/2017 IMPRESSION:  1.  Pericardial effusion and cardiomegaly.  Coronary artery disease.  2. Parenchymal changes consistent with pulmonary edema and small pleural effusions.  3. 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 Recommend annual imaging followup by CTA or MRA. This recommendation follows 2010 ACCF/AHA/AATS/ACR/ASA/SCA/SCAI/SIR/STS/SVM Guidelines for the Diagnosis and Management of Patients with Thoracic Aortic Disease. Circulation.2010; 121: D532-D924  4. Aortic aneurysm NOS (ICD10-I71.9). Aortic Atherosclerosis (ICD10-I70.0).  5. Stable probable scar at the LEFT lung apex. 6. Numerous thoracic compression fractures.     Dg Chest Port 1 View 10/30/2017 IMPRESSION:  Cardiomegaly with findings of CHF and small bilateral pleural effusions. No pneumothorax.     Transthoracic Echocardiogram  10/29/2017 Study Conclusions - Left ventricle: The cavity size was severely dilated. Systolic   function was severely reduced. The estimated ejection fraction   was 25%. Akinesis of the anteroseptal myocardium. Akinesis of the   anterior myocardium. - Aortic valve: There was trivial regurgitation. Valve area (VTI):   1.68 cm^2. Valve area (Vmax): 1.9 cm^2. Valve area (Vmean): 1.81   cm^2. - Mitral valve: There was mild regurgitation. Valve area by   continuity equation (using LVOT flow): 2.19 cm^2. - Left atrium: The atrium was mildly dilated. - Right ventricle: The cavity size was mildly dilated. - Right atrium: The atrium was mildly dilated. - Pericardium, extracardiac: A trivial pericardial effusion was   identified posterior to the heart. Features were not consistent   with tamponade physiology. There was a left pleural effusion. - 4 Chamber dilatation with severe LV systolic dysfunction with   anteroapical akinesis sugestive ASWMI, and no thrombii present in   LV. Fibrocalcified aortic valve without AS.   Left Heart Cath - Neoma Laming MD [facility] 10/29/2017  Ost Cx to Prox Cx lesion is 80% stenosed with 90% stenosed side branch in Ost 1st Mrg to 1st Mrg.  Mid LAD lesion is 95% stenosed.  Mid RCA lesion is 75% stenosed.  Ost LAD to Prox LAD lesion is 70% stenosed. Severe 3 vessel disease with severe LV systolic dysfunction and anteroapical aneurysm. Advise CABG at [facility]   PHYSICAL EXAM Vitals:   10/31/17 0400 10/31/17 0500 10/31/17 0600 10/31/17 0700  BP: (!) 143/83 140/83 (!) 148/86 (!) 146/94  Pulse: 85 87 86 74  Resp: 18 17 16 16   Temp:      TempSrc:      SpO2: 97% 97% 100% 100%  Weight:        General -  Heart - Regular rate and rhythm - no murmer appreciated Lungs - Clear to auscultation anteriorly Abdomen - Soft - non  tender Extremities - Distal pulses intact - no edema Skin - Warm and dry, R forearm IV infiltrate, normal temperature, small area, mild erythema   Mental Status: GCS 15, awake, alert oriented to self and place and time, follows commands, pupils reactive B/L, EOMI, visual field intact, speech dysarthric but impoving, face symmetric, motor 5/5 all group, sensory intact, slow finger to nose B/L   ASSESSMENT/PLAN [name] is a 84 y.o. female with history of previous stroke, recurrent falls, Polycythemia Vera, Microalbuminuria, HTN, TIA, Endometrial Carcinoma, Diabetes Mellitus, Colon Cancer, Breast Cancer, Arthritis, Adenocarcinoma in Situ of Cervix, severe three-vessel coronary artery disease, ejection fraction 25%, and congestive heart failure presented to [facility] hospital with black emesis and unresponsive 2/2 hypoxia transferred to [facility] with a RIGHT cerebellar parenchymal hemorrhage.  She did not receive IV t-PA due to [place].  RIGHT cerebellar parenchymal hemorrhage   CT head - RIGHT cerebellar parenchymal hemorrhage approximately 31 x 16 x 19 mm extension of hemorrhage into the fourth ventricle, stable on a follow up [facility] 5/12 stable ICH/IVH no Hydrocephalus    MRI head - ordered  for tomorrow to help with the etiology of the [place] if she can tolerate the study   MRA head - not performed  At risk for hydrocephalus, on hyperosmolar therapy 3% nacl, keep NA 140-150 currently at 50 ML/HR with NA 147  Neurosurgery consult appreciated, no surgical intervention  Carotid Doppler - not indicated  2D Echo - as above EF 25%  LDL - 78  HgbA1c - 7.0  VTE prophylaxis - SCDs Diet Order           Diet Carb Modified Fluid consistency: Thin; Room service appropriate? Yes  Diet effective now          clopidogrel 75 mg daily prior to admission, now on No antithrombotic  Ongoing aggressive stroke risk factor management  Therapy recommendations:  pending  Disposition:  Pending  Hypertension  Stable . Systolic blood pressure goal less than 140  . Long-term BP goal normotensive  Hyperlipidemia  Lipid lowering medication PTA:  Pravachol 20 mg daily  LDL 78, goal < 70  Current lipid lowering medication: none - resume Pravachol at discharge. (muscle aches with simvastatin)  Continue statin at discharge  Diabetes  HgbA1c 7.0, goal < 7.0  Uncontrolled  Other Stroke Risk Factors  Advanced age  Former cigarette smoker - quit  Hx stroke/TIA  Coronary artery disease   Other Active Problems/plan   Severe 3 vessel CAD - NSTEMI  EF 25% - CHF   BUN 26 ; creatinine 1.2  Hematemesis - GI consulted at [facility]. On Protonix. 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 After hours, contact General Neurology

## 2017-10-31 NOTE — Progress Notes (Signed)
Inpatient Rehabilitation  Per PT and SLP request, patient was screened by Kienna Moncada for appropriateness for an Inpatient Acute Rehab consult.  At this time we are recommending an Inpatient Rehab consult.  Please order if you are agreeable.    Meko Masterson, M.A., CCC/SLP Admission Coordinator  [facility]  Cell [phone]  

## 2017-10-31 NOTE — Progress Notes (Signed)
Subjective:  Patient denies any chest pain states breathing has improved More alert and awake today Objective:  Vital Signs in the last 24 hours: Temp:  [96.5 F (35.8 C)-98.6 F (37 C)] 98.6 F (37 C) (05/12 0800) Pulse Rate:  [74-99] 74 (05/12 0700) Resp:  [13-23] 16 (05/12 0700) BP: (102-149)/(59-100) 146/94 (05/12 0700) SpO2:  [97 %-100 %] 100 % (05/12 0700)  Intake/Output from previous day: 05/11 0701 - 05/12 0700 In: 1590.1 [P.O.:240; I.V.:1050.1; IV Piggyback:300] Out: 1725 [Urine:1725] Intake/Output from this shift: No intake/output data recorded.  Physical Exam: Neck: no adenopathy, no carotid bruit, no JVD and supple, symmetrical, trachea midline Lungs: Decreased decreased breath sound at bases with faint rales air entry has improved Heart: regular rate and rhythm, S1, S2 normal and Soft systolic murmur and S3 gallop noted Abdomen: soft, non-tender; bowel sounds normal; no masses,  no organomegaly Extremities: extremities normal, atraumatic, no cyanosis or edema  Lab Results: Recent Labs    10/30/17 0249 10/31/17 0621  WBC 18.3* 12.9*  HGB 12.5 12.0  PLT 368 333   Recent Labs    10/30/17 0249  10/31/17 0119 10/31/17 0621  NA 143   < > 147* 148*  K 2.8*  --   --  3.0*  CL 104  --   --  108  CO2 26  --   --  28  GLUCOSE 229*  --   --  241*  BUN 26*  --   --  20  CREATININE 1.21*  --   --  1.17*   < > = values in this interval not displayed.   Recent Labs    10/29/17 1008 10/31/17 0621  TROPONINI 6.25* 2.57*   Hepatic Function Panel Recent Labs    10/28/17 1504  PROT 7.5  ALBUMIN 4.3  AST 31  ALT 17  ALKPHOS 48  BILITOT 0.8   No results for input(s): CHOL in the last 72 hours. No results for input(s): PROTIME in the last 72 hours.  Imaging: Imaging results have been reviewed and Ct Head Wo Contrast  Result Date: 10/31/2017 CLINICAL DATA:  Follow-up intracranial hemorrhage. EXAM: CT HEAD WITHOUT CONTRAST TECHNIQUE: Contiguous axial  images were obtained from the base of the skull through the vertex without intravenous contrast. COMPARISON:  CT HEAD Oct 29, 2016 FINDINGS: BRAIN: Evolving RIGHT cerebellar periventricular 1.5 x 2.7 cm hematoma, was 1.5 x 2.9 cm. Minimal surrounding low-density vasogenic edema with minimal regional mass effect. Intraventricular extension again noted. Ventricles and sulci are normal for patient's age. Patchy supratentorial white matter hypodensities compatible with moderate chronic small vessel ischemic changes. No midline shift or acute large vascular territory infarcts. No abnormal extra-axial fluid collections. VASCULAR: Mild calcific atherosclerosis of the carotid siphons. SKULL: No skull fracture. No significant scalp soft tissue swelling. SINUSES/ORBITS: Mild ethmoid mucosal thickening, hypoplastic sphenoid sinuses. Small LEFT mastoid effusion.The included ocular globes and orbital contents are non-suspicious. Status post bilateral ocular lens implants. OTHER: None. IMPRESSION: Evolving RIGHT cerebellar intraparenchymal hematoma with unchanged intraventricular extension. No hydrocephalus. Electronically Signed   By: Elon Alas M.D.   On: 10/31/2017 01:11   Ct Head Wo Contrast  Result Date: 10/29/2017 CLINICAL DATA:  Continued surveillance intracranial hemorrhage. Transfer to different facility. EXAM: CT HEAD WITHOUT CONTRAST TECHNIQUE: Contiguous axial images were obtained from the base of the skull through the vertex without intravenous contrast. COMPARISON:  CT head earlier in the day. FINDINGS: Brain: Redemonstrated is a RIGHT cerebellar hemorrhage, not significantly changed from priors, cross-section  15 x 29 x 19 mm. Extension into the IV ventricle without hydrocephalus. Vascular: Calcification of the cavernous internal carotid arteries consistent with cerebrovascular atherosclerotic disease. No signs of intracranial large vessel occlusion. Blood pool affect is decreased, related to  intravascular contrast from prior cardiac catheterization. Skull: Normal. Negative for fracture or focal lesion. Sinuses/Orbits: No significant paranasal sinus disease. Negative orbits. Other: None. IMPRESSION: Stable RIGHT cerebellar hemorrhage, with extension into the IV ventricle without hydrocephalus. Electronically Signed   By: Staci Righter M.D.   On: 10/29/2017 23:05   Ct Head Wo Contrast  Result Date: 10/29/2017 CLINICAL DATA:  Inpatient with acute encephalopathy. Respiratory failure. EXAM: CT HEAD WITHOUT CONTRAST TECHNIQUE: Contiguous axial images were obtained from the base of the skull through the vertex without intravenous contrast. COMPARISON:  CT head 03/30/2015. MR head 03/29/2015. FINDINGS: Brain: There is a RIGHT cerebellar parenchymal hemorrhage, with intraventricular extension. Measurements 31 x 16 x 19 mm, corresponding to a volume of approximately 5 mL. Blood extends from the fourth ventricle into the BILATERAL foramina of [place]. There is no hydrocephalus. Generalized atrophy is present. There is no acute infarct. Extensive hypoattenuation of white matter representing small vessel disease. No extra-axial hematoma. Vascular: No hyperdense vessel. Skull: Calvarium intact. Sinuses/Orbits: Mild sinus disease.  Negative orbits. Other: None. Compared with priors, no abnormality was seen in this location. IMPRESSION: RIGHT cerebellar parenchymal hemorrhage approximately 31 x 16 x 19 mm. There is extension of hemorrhage into the fourth ventricle, without hydrocephalus. A hypertensive bleed is most likely, but unrecognized trauma, anticoagulation, vascular malformation, hemorrhagic metastatic neoplasm, are all considerations. Critical Value/emergent results were called by telephone at the time of interpretation on 10/29/2017 at 4:23 pm to Dr. [name] , who verbally acknowledged these results. Electronically Signed   By: Staci Righter M.D.   On: 10/29/2017 16:23   Dg Chest Port 1  View  Result Date: 10/31/2017 CLINICAL DATA:  Respiratory failure EXAM: PORTABLE CHEST 1 VIEW COMPARISON:  Portable exam 0510 hours compared to 10/30/2017 FINDINGS: RIGHT arm PICC line tip projects over cavoatrial junction. Enlargement of cardiac silhouette. Atherosclerotic calcification aorta. Mediastinal contours and pulmonary vascularity normal. Improved pulmonary edema. Minimal atelectasis at LEFT lower lobe. No gross pleural effusion or pneumothorax. Bones demineralized. IMPRESSION: Enlargement of cardiac silhouette. Improved pulmonary edema. Electronically Signed   By: Lavonia Dana M.D.   On: 10/31/2017 07:44   Dg Chest Port 1 View  Result Date: 10/30/2017 CLINICAL DATA:  84 year old female with acute respiratory failure. EXAM: PORTABLE CHEST 1 VIEW COMPARISON:  Chest CT dated 10/28/2017 FINDINGS: There is cardiomegaly, mild interstitial edema, and small bilateral pleural effusions. Mildly prominent pulmonary artery may represent a degree of pulmonary hypertension. No pneumothorax. There is atherosclerotic calcification of the aorta. No acute osseous pathology. IMPRESSION: Cardiomegaly with findings of CHF and small bilateral pleural effusions. No pneumothorax. Electronically Signed   By: Anner Crete M.D.   On: 10/30/2017 02:28   [place] Ekg Site Rite  Result Date: 10/30/2017 If Site Rite image not attached, placement could not be confirmed due to current cardiac rhythm.   Cardiac Studies:  Assessment/Plan:  Status post acute anteroseptal wall MI with atypical presentation Multivessel calcific 3 vessel disease Decompensated systolic congestive heart failure Acute right cerebellar bleed with extension to IVH Hypertension Diabetes mellitus History of polycythemia vera History of TIA in the past Osteoporosis Hypokalemia Plan Replace K Start Altace 5 mg twice daily Reduce Lasix to 40 mg IV daily Check labs in a.m. Okay to ambulate from  cardiac point of view   LOS: 2 days     Charolette Forward 10/31/2017, 9:35 AM

## 2017-10-31 NOTE — Progress Notes (Signed)
Pt has requested numerous times to not have any meal with cheese on it or in it.  Dietary services has been made aware several times of this request but the pt's meals have included cheese.  I will call them again to remind them of the pt's diet order.

## 2017-10-31 NOTE — Progress Notes (Signed)
Pt c/o of SOB and upper abdominal pain, she thought maybe gas. BP in the 150s, gave PRN and BP only decreased to mid 140s. Other vital signs - O2, RR, HR - were WNL. Paged Dr. Terrence Dupont he gave new orders for Lasix, Protonix and KCL.

## 2017-10-31 NOTE — Evaluation (Signed)
Speech Language Pathology Evaluation Patient Details Name: [name] MRN: [id number] DOB: [birth year] Today's Date: 10/31/2017 Time: 1100-1130 SLP Time Calculation (min) (ACUTE ONLY): 30 min  Problem List:  Patient Active Problem List   Diagnosis Date Noted  . Acute respiratory failure with hypoxia ([place])   . IVH (intraventricular hemorrhage) ([place]) 10/29/2017  . Hypoxia 10/28/2017  . Pancreatic lesion 05/24/2017  . 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 Pulmonary nodule, left 06/04/2015  . 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 Cerebral thrombosis with cerebral infarction 04/03/2015  . Cancer of right colon ([place]) 03/28/2015  . CVA (cerebral infarction) 12/21/2014  . Polycythemia vera ([place]) 06/22/2006   Past Medical History:  Past Medical History:  Diagnosis Date  . Adenocarcinoma in situ of cervix   . Arthritis    hands  . Breast cancer ([place])   . Colon cancer ([place])   . Diabetes mellitus without complication ([place])   . Endometrial carcinoma ([facility])    s/p total abdominal hysterectomy  . H/O compression fracture of spine 2014   thoracic spine  . H/O polycythemia vera   . H/O TIA (transient ischemic attack) and stroke 09/2014, 03/2015   No deficits  . Hypertension   . Hyperuricemia   . Microalbuminuria   . Polycythemia vera ([place])   . Recurrent falls   . Skin cancer    face, legs  . Stroke [facility]) 2008   no deficits  . Trochanteric bursitis   . Varicose veins    treated   Past Surgical History:  Past Surgical History:  Procedure Laterality Date  . ABDOMINAL HYSTERECTOMY    . BOWEL RESECTION N/A 03/28/2015   Procedure: SMALL BOWEL RESECTION;  Surgeon: Leonie Green, MD;  Location: [facility] ORS;  Service: General;  Laterality: N/A;  . CATARACT EXTRACTION W/ INTRAOCULAR LENS IMPLANT Right   . CATARACT EXTRACTION W/PHACO Left 10/07/2015   Procedure: CATARACT EXTRACTION PHACO AND INTRAOCULAR LENS PLACEMENT (IOC);  Surgeon: Ronnell Freshwater, MD;  Location: [facility];  Service: Ophthalmology;  Laterality: Left;  DIABETIC - oral meds VISION BLUE  . CORONARY ANGIOGRAPHY N/A 10/29/2017   Procedure: CORONARY ANGIOGRAPHY;  Surgeon: Dionisio David, MD;  Location: [facility];  Service: Cardiovascular;  Laterality: N/A;  . EXPLORATORY LAPAROTOMY     for fibroids  . LEFT HEART CATH Right 10/29/2017   Procedure: Left Heart Cath;  Surgeon: Dionisio David, MD;  Location: [place];  Service: Cardiovascular;  Laterality: Right;  . TONSILLECTOMY     HPI:  84 year old female with PMhx: HTN, DM, TIA, DJD, breast and colon CA. Admitted to [facility] with acute onset of confusion and lethargy, generalized weakness and shortness of breath. Pt s/p left heart cath with 3 vessel CAD, CHF and acute MI. CT which showed right cerebellar hemorrhage with IVH.   Assessment / Plan / Recommendation Clinical Impression   Patient presents with cognitive communication impairment with deficits in executive function, attention, reasoning and linguistic processing. She requires frequent rephrasing and repetition of multi-step and complex commands. Affect is flat; pt demonstrates avoidant behaviors during some testing tasks. Administered Cerebellar Cognitive Affective/Schmahmann Syndrome Scale Version 1A; pt failed 9/10 items. Intelligibility is also impaired, with contributing factors of xerostomia, occasional disfluencies (initial consonant, syllable repetitions) and most significantly affect, as pt appears to put minimal efforts into attaining adequate breath support or vocal intensity. Pt was highly independent prior to admission; agree that she would benefit from CIR  for skilled ST to address cognitive linguistic impairments in order to improve independence, communication and quality of life. Will follow acutely.    SLP Assessment  SLP Recommendation/Assessment: Patient needs continued Speech Lanaguage Pathology Services SLP  Visit Diagnosis: Dysarthria and anarthria (R47.1);Cognitive communication deficit (R41.841)    Follow Up Recommendations  Inpatient Rehab    Frequency and Duration min 2x/week  2 weeks      SLP Evaluation Cognition  Overall Cognitive Status: Impaired/Different from baseline Arousal/Alertness: Awake/alert Orientation Level: Oriented X4 Attention: Focused;Sustained;Selective;Alternating Focused Attention: Appears intact Sustained Attention: Impaired Sustained Attention Impairment: Verbal basic;Functional basic Selective Attention: Impaired Selective Attention Impairment: Verbal basic;Functional basic Alternating Attention: Impaired Alternating Attention Impairment: Verbal basic Memory: Impaired Memory Impairment: Storage deficit;Decreased recall of new information(Verbal recall 10/15pt) Awareness: Impaired Awareness Impairment: Emergent impairment(pt felt testing did not go as well as she expected) Problem Solving: Impaired Problem Solving Impairment: Verbal basic(sustained attention impacting) Executive Function: Reasoning;Initiating Reasoning: Impaired Reasoning Impairment: Verbal basic;Functional basic Initiating: Impaired Initiating Impairment: Functional basic Behaviors: Perseveration;Other (comment)(avoidant)       Comprehension  Auditory Comprehension Overall Auditory Comprehension: Impaired Yes/No Questions: Within Functional Limits Commands: Impaired One Step Basic Commands: 75-100% accurate Two Step Basic Commands: 75-100% accurate Multistep Basic Commands: 75-100% accurate(75%, requires repetition) Complex Commands: 75-100% accurate(75%; repetition, rephrasing required) Conversation: Simple Interfering Components: Attention;Processing speed EffectiveTechniques: Extra processing time;Repetition;Other (Comment)(rephrasing) Visual Recognition/Discrimination Discrimination: Not tested Reading Comprehension Reading Status: Not tested    Expression  Expression Primary Mode of Expression: Verbal Verbal Expression Overall Verbal Expression: Impaired Initiation: No impairment Automatic Speech: Name Level of Generative/Spontaneous Verbalization: Conversation Naming: Impairment Divergent: (5 animals in 60 seconds; 1 f word in 60 seconds) Pragmatics: Impairment Impairments: Abnormal affect;Dysprosody;Eye contact;Interpretation of nonverbal communication;Monotone Interfering Components: Attention Effective Techniques: Open ended questions Non-Verbal Means of Communication: Not applicable Written Expression Dominant Hand: Right Written Expression: Not tested   Oral / Motor  Oral Motor/Sensory Function Overall Oral Motor/Sensory Function: Within functional limits Motor Speech Overall Motor Speech: Impaired Phonation: Low vocal intensity Resonance: Within functional limits Articulation: Impaired Level of Impairment: Word Intelligibility: Intelligibility reduced Word: 75-100% accurate(90%) Phrase: 75-100% accurate(85%) Conversation: 75-100% accurate(80%) Motor Planning: Witnin functional limits Motor Speech Errors: (disfluencies; initial consonant and syllable repetitions) Interfering Components: (xerostomia)   [place], [place], [place] Pathologist [phone]  Aliene Altes 10/31/2017, 12:00 PM

## 2017-10-31 NOTE — Evaluation (Signed)
Physical Therapy Evaluation Patient Details Name: [name] MRN: [id number] DOB: January 23, 1932 Today's Date: 10/31/2017   History of Present Illness  84 year old female with PMhx: HTN, DM, TIA, DJD, breast and colon CA. Admitted to [facility] with acute onset of confusion and lethargy, generalized weakness and shortness of breath. Pt s/p left heart cath with 3 vessel CAD, CHF and acute MI. CT which showed right cerebellar hemorrhage with IVH.  Clinical Impression  Pt very pleasant, soft spoken with flat affect. Pt was independent, living alone and planting flowers PTA. Pt with good mobility today limited by fatigue with activity. Pt with decreased activity, balance, gait and transfers who will benefit from acute therapy to maximize mobility, function and independence to decrease burden of care.   HR 78-95 with activity SpO2 92-96% on RA BP 143/90    Follow Up Recommendations CIR;Supervision/Assistance - 24 hour    Equipment Recommendations  Rolling walker with 5" wheels    Recommendations for Other Services       Precautions / Restrictions Precautions Precautions: Fall      Mobility  Bed Mobility Overal bed mobility: Needs Assistance Bed Mobility: Supine to Sit     Supine to sit: Min guard;HOB elevated     General bed mobility comments: HOb 25degrees with increased time and use of rail pt able to pivot to EOB without physical assist, guarding for lines and safety  Transfers Overall transfer level: Needs assistance   Transfers: Sit to/from Stand Sit to Stand: Min assist         General transfer comment: min assist to rise from bed and bSC with cues for hand placement  Ambulation/Gait Ambulation/Gait assistance: Min assist Ambulation Distance (Feet): 80 Feet Assistive device: Rolling walker (2 wheeled) Gait Pattern/deviations: Step-through pattern;Decreased stride length;Trunk flexed   Gait velocity interpretation: <1.31 ft/sec, indicative of household  ambulator General Gait Details: cues for position in RW, assist to direct and move RW with turning, slow steady gait with reliance on RW  Stairs            Wheelchair Mobility    Modified Rankin (Stroke Patients Only) Modified Rankin (Stroke Patients Only) Pre-Morbid Rankin Score: No symptoms Modified Rankin: Moderately severe disability     Balance Overall balance assessment: Needs assistance   Sitting balance-Leahy Scale: Fair Sitting balance - Comments: able to sit without UE support EOB     Standing balance-Leahy Scale: Poor Standing balance comment: bil UE support in standing                             Pertinent Vitals/Pain Pain Assessment: No/denies pain    Home Living Family/patient expects to be discharged to:: Private residence Living Arrangements: Alone Available Help at Discharge: Family;Available PRN/intermittently Type of Home: House Home Access: Stairs to enter Entrance Stairs-Rails: Left Entrance Stairs-Number of Steps: 2 Home Layout: One level Home Equipment: Cane - single point;Walker - 2 wheels      Prior Function Level of Independence: Independent         Comments: ADLs, IADLs, and driving. 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     Hand Dominance   Dominant Hand: Right    Extremity/Trunk Assessment   Upper Extremity Assessment Upper Extremity Assessment: Defer to OT evaluation    Lower Extremity Assessment Lower Extremity Assessment: Generalized weakness    Cervical / Trunk Assessment Cervical / Trunk Assessment: Kyphotic  Communication   Communication: Expressive difficulties(pt soft spoken)  Cognition  Arousal/Alertness: Awake/alert Behavior During Therapy: Flat affect Overall Cognitive Status: Impaired/Different from baseline Area of Impairment: Following commands;Safety/judgement;Problem solving                       Following Commands: Follows one step commands consistently     Problem Solving:  Slow processing General Comments: pt moving well and able to tolerate increased activity, increased time to initiate and slow processing      General Comments      Exercises     Assessment/Plan    PT Assessment Patient needs continued PT services  PT Problem List Decreased strength;Decreased mobility;Decreased activity tolerance;Decreased balance;Decreased knowledge of use of DME       PT Treatment Interventions Gait training;Therapeutic exercise;Patient/family education;Stair training;Balance training;Functional mobility training;DME instruction;Therapeutic activities    PT Goals (Current goals can be found in the Care Plan section)  Acute Rehab PT Goals Patient Stated Goal: plant flowers PT Goal Formulation: With patient Time For Goal Achievement: 11/14/17 Potential to Achieve Goals: Good    Frequency Min 3X/week   Barriers to discharge Decreased caregiver support      Co-evaluation               AM-PAC PT "6 Clicks" Daily Activity  Outcome Measure Difficulty turning over in bed (including adjusting bedclothes, sheets and blankets)?: A Little Difficulty moving from lying on back to sitting on the side of the bed? : A Lot Difficulty sitting down on and standing up from a chair with arms (e.g., wheelchair, bedside commode, etc,.)?: Unable Help needed moving to and from a bed to chair (including a wheelchair)?: A Little Help needed walking in hospital room?: A Little Help needed climbing 3-5 steps with a railing? : A Lot 6 Click Score: 14    End of Session Equipment Utilized During Treatment: Gait belt Activity Tolerance: Patient tolerated treatment well Patient left: in chair;with call bell/phone within reach;with nursing/sitter in room Nurse Communication: Mobility status PT Visit Diagnosis: Other abnormalities of gait and mobility (R26.89);Muscle weakness (generalized) (M62.81);Difficulty in walking, not elsewhere classified (R26.2)    Time: 0017-4944 PT  Time Calculation (min) (ACUTE ONLY): 20 min   Charges:   PT Evaluation $PT Eval Moderate Complexity: 1 Mod     PT G Codes:        Elwyn Reach, PT [phone]   [place] B Kendrik Mcshan 10/31/2017, 9:57 AM

## 2017-11-01 ENCOUNTER — Inpatient Hospital Stay (HOSPITAL_COMMUNITY): Payer: Medicare Other

## 2017-11-01 DIAGNOSIS — D62 Acute posthemorrhagic anemia: Secondary | ICD-10-CM

## 2017-11-01 DIAGNOSIS — Z8673 Personal history of transient ischemic attack (TIA), and cerebral infarction without residual deficits: Secondary | ICD-10-CM

## 2017-11-01 DIAGNOSIS — D45 Polycythemia vera: Secondary | ICD-10-CM

## 2017-11-01 DIAGNOSIS — I614 Nontraumatic intracerebral hemorrhage in cerebellum: Secondary | ICD-10-CM

## 2017-11-01 DIAGNOSIS — E870 Hyperosmolality and hypernatremia: Secondary | ICD-10-CM

## 2017-11-01 DIAGNOSIS — R7989 Other specified abnormal findings of blood chemistry: Secondary | ICD-10-CM

## 2017-11-01 DIAGNOSIS — I10 Essential (primary) hypertension: Secondary | ICD-10-CM

## 2017-11-01 DIAGNOSIS — I5021 Acute systolic (congestive) heart failure: Secondary | ICD-10-CM

## 2017-11-01 DIAGNOSIS — Z8541 Personal history of malignant neoplasm of cervix uteri: Secondary | ICD-10-CM

## 2017-11-01 DIAGNOSIS — D72829 Elevated white blood cell count, unspecified: Secondary | ICD-10-CM

## 2017-11-01 LAB — CBC
HCT: 36.5 % (ref 36.0–46.0)
Hemoglobin: 10.6 g/dL — ABNORMAL LOW (ref 12.0–15.0)
MCH: 26.2 pg (ref 26.0–34.0)
MCHC: 29 g/dL — ABNORMAL LOW (ref 30.0–36.0)
MCV: 90.3 fL (ref 78.0–100.0)
Platelets: 269 10*3/uL (ref 150–400)
RBC: 4.04 MIL/uL (ref 3.87–5.11)
RDW: 18.6 % — ABNORMAL HIGH (ref 11.5–15.5)
WBC: 15.1 10*3/uL — ABNORMAL HIGH (ref 4.0–10.5)

## 2017-11-01 LAB — GLUCOSE, CAPILLARY
Glucose-Capillary: 224 mg/dL — ABNORMAL HIGH (ref 65–99)
Glucose-Capillary: 247 mg/dL — ABNORMAL HIGH (ref 65–99)
Glucose-Capillary: 202 mg/dL — ABNORMAL HIGH (ref 65–99)
Glucose-Capillary: 211 mg/dL — ABNORMAL HIGH (ref 65–99)

## 2017-11-01 LAB — BASIC METABOLIC PANEL
Anion gap: 12 (ref 5–15)
BUN: 21 mg/dL — ABNORMAL HIGH (ref 6–20)
CO2: 24 mmol/L (ref 22–32)
Calcium: 7.7 mg/dL — ABNORMAL LOW (ref 8.9–10.3)
Chloride: 115 mmol/L — ABNORMAL HIGH (ref 101–111)
Creatinine, Ser: 1.23 mg/dL — ABNORMAL HIGH (ref 0.44–1.00)
GFR calc Af Amer: 46 mL/min — ABNORMAL LOW (ref >60)
GFR calc non Af Amer: 39 mL/min — ABNORMAL LOW (ref >60)
Glucose, Bld: 233 mg/dL — ABNORMAL HIGH (ref 65–99)
Potassium: 3.5 mmol/L (ref 3.5–5.1)
Sodium: 151 mmol/L — ABNORMAL HIGH (ref 135–145)

## 2017-11-01 LAB — SODIUM
Sodium: 145 mmol/L (ref 135–145)
Sodium: 151 mmol/L — ABNORMAL HIGH (ref 135–145)
Sodium: 152 mmol/L — ABNORMAL HIGH (ref 135–145)

## 2017-11-01 LAB — MAGNESIUM: Magnesium: 1.6 mg/dL — ABNORMAL LOW (ref 1.7–2.4)

## 2017-11-01 MED ORDER — INSULIN DETEMIR 100 UNIT/ML ~~LOC~~ SOLN
6.0000 [IU] | Freq: Every day | SUBCUTANEOUS | Status: DC
  Administered 2017-11-01 – 2017-11-03 (×3): 6 [IU] via SUBCUTANEOUS
  Filled 2017-11-01 (×4): qty 0.06

## 2017-11-01 MED ORDER — ENOXAPARIN SODIUM 30 MG/0.3ML ~~LOC~~ SOLN
30.0000 mg | SUBCUTANEOUS | Status: DC
  Administered 2017-11-01 – 2017-11-04 (×4): 30 mg via SUBCUTANEOUS
  Filled 2017-11-01 (×4): qty 0.3

## 2017-11-01 MED ORDER — RAMIPRIL 10 MG PO CAPS
10.0000 mg | ORAL_CAPSULE | Freq: Two times a day (BID) | ORAL | Status: DC
  Administered 2017-11-01 – 2017-11-04 (×6): 10 mg via ORAL
  Filled 2017-11-01 (×6): qty 1

## 2017-11-01 MED ORDER — LABETALOL HCL 5 MG/ML IV SOLN: 10.0000 mg | INTRAVENOUS | Status: DC | PRN

## 2017-11-01 NOTE — Consult Note (Signed)
Physical Medicine and Rehabilitation Consult Reason for Consult: Decreased functional mobility Referring Physician: Dr. Leonie Man  HPI: EZELLA KELL is a 84 y.o. right-handed female with history of adenosarcoma of cervix, endometrial carcinoma status post total abdominal hysterectomy, diabetes mellitus, polycythemia vera, TIA with recurrent falls maintained on Plavix.  Per chart review, and niece, patient lives alone.  One level home with 2 steps to entry.  Reportedly independent prior to admission and driving.  Question of family to assist on discharge.  Presented to [facility]/28/2017 with altered mental status, hematemesis and lethargy as well as difficulty breathing. Oxygen saturation in the 70s requiring nonrebreather mask.  WBC 26,000, troponin 1.02, lactic acid 3.1 CT angiogram of the chest showed pericardial effusion and cardiomegaly consistent with pulmonary edema.  CT the head reviewed, showing right cerebellar hemorrhage.  Per report, right cerebellar parenchymal hemorrhage approximately 31 x 16 x 19 mm.  There was extension of hemorrhage into the fourth ventricle without hydrocephalus.  Patient was ruled  in for non-Q wave myocardial infarction and underwent cardiac catheterization showing three-vessel disease and severe LV dysfunction.  CVTS consulted for possible need for CABG was not felt to be a good candidate for surgery due to multiple comorbidities.  Patient was transferred to [facility] for further evaluation.  Echocardiogram with ejection fraction of 25% as well as akinesis of the anterior myocardium.  Neurosurgery Dr. Sherley Bounds follow-up for cerebellar hemorrhage advise conservative care with follow-up cranial CT scan 10/31/2017 and MRI on 11/01/2017 stable.  Placed on intravenous Lasix for diuresis per cardiology services and awaiting further plan of care.  Physical and occupational therapy evaluations completed with recommendations of physical medicine rehab consult.  Review  of Systems  Constitutional: Negative for chills and fever.  HENT: Negative for hearing loss.   Eyes: Negative for blurred vision and double vision.  Respiratory: Positive for shortness of breath.   Cardiovascular: Positive for leg swelling. Negative for chest pain and palpitations.  Gastrointestinal: Positive for nausea and vomiting.  Genitourinary: Negative for hematuria.  Musculoskeletal: Positive for joint pain and myalgias.  Skin: Negative for rash.  All other systems reviewed and are negative.  Past Medical History:  Diagnosis Date  . Adenocarcinoma in situ of cervix   . Arthritis    hands  . Breast cancer ([place])   . Colon cancer ([place])   . Diabetes mellitus without complication ([place])   . Endometrial carcinoma ([facility])    s/p total abdominal hysterectomy  . H/O compression fracture of spine 2014   thoracic spine  . H/O polycythemia vera   . H/O TIA (transient ischemic attack) and stroke 09/2014, 03/2015   No deficits  . Hypertension   . Hyperuricemia   . Microalbuminuria   . Polycythemia vera ([place])   . Recurrent falls   . Skin cancer    face, legs  . Stroke [facility]) 2008   no deficits  . Trochanteric bursitis   . Varicose veins    treated   Past Surgical History:  Procedure Laterality Date  . ABDOMINAL HYSTERECTOMY    . BOWEL RESECTION N/A 03/28/2015   Procedure: SMALL BOWEL RESECTION;  Surgeon: Leonie Green, MD;  Location: [facility] ORS;  Service: General;  Laterality: N/A;  . CATARACT EXTRACTION W/ INTRAOCULAR LENS IMPLANT Right   . CATARACT EXTRACTION W/PHACO Left 10/07/2015   Procedure: CATARACT EXTRACTION PHACO AND INTRAOCULAR LENS PLACEMENT (IOC);  Surgeon: Ronnell Freshwater, MD;  Location: [place];  Service: Ophthalmology;  Laterality: Left;  DIABETIC - oral meds VISION BLUE  . CORONARY ANGIOGRAPHY N/A 10/29/2017   Procedure: CORONARY ANGIOGRAPHY;  Surgeon: Dionisio David, MD;  Location: [place] CV LAB;  Service: Cardiovascular;   Laterality: N/A;  . EXPLORATORY LAPAROTOMY     for fibroids  . LEFT HEART CATH Right 10/29/2017   Procedure: Left Heart Cath;  Surgeon: Dionisio David, MD;  Location: [place] CV LAB;  Service: Cardiovascular;  Laterality: Right;  . TONSILLECTOMY     Family History  Problem Relation Age of Onset  . Cancer Brother        AML  . Heart attack Mother   . Breast cancer Mother   . Heart attack Father   . Heart attack Sister   . Diabetes Sister    Social History:  reports that she has quit smoking. She has never used smokeless tobacco. She reports that she does not drink alcohol or use drugs. Allergies:  Allergies  Allergen Reactions  . Simvastatin     Other reaction(s): Muscle Pain   Medications Prior to Admission  Medication Sig Dispense Refill  . alendronate (FOSAMAX) 70 MG tablet Take 70 mg by mouth once a week. Take with a full glass of water on an empty stomach.    . calcium-vitamin D (OSCAL WITH D) 500-200 MG-UNIT per tablet Take 1 tablet by mouth daily.     . carvedilol (COREG) 6.25 MG tablet Take 6.25 mg by mouth 2 (two) times daily with a meal.    . chlorthalidone (HYGROTON) 25 MG tablet Take 25 mg by mouth daily.    . clopidogrel (PLAVIX) 75 MG tablet Take 1 tablet (75 mg total) by mouth daily. 30 tablet 0  . docusate sodium (COLACE) 250 MG capsule Take 250 mg by mouth daily as needed for constipation.    . fexofenadine (ALLEGRA) 180 MG tablet Take 180 mg by mouth daily as needed.     . hydroxyurea (HYDREA) 500 MG capsule TAKE 1 CAPSULE BY MOUTH ON MONDAY WEDNESDAY FRIDAY AND Saturday. Take  1 capsule every other Tuesday WITH FOOD (Patient taking differently: 500 mg. TAKE 1 CAPSULE BY MOUTH ON Sunday through Friday.  Do no take on Saturday) 54 capsule 1  . loperamide (IMODIUM) 2 MG capsule Take 4 mg by mouth as needed for diarrhea or loose stools.    . metFORMIN (GLUCOPHAGE) 1000 MG tablet Take 1,000 mg by mouth 2 (two) times daily.     . pioglitazone (ACTOS) 15 MG tablet  Take 15 mg by mouth daily.    . polyethylene glycol (MIRALAX / GLYCOLAX) packet Take 17 g by mouth daily as needed.    . potassium chloride (K-DUR,KLOR-CON) 10 MEQ tablet Take 10 mEq by mouth daily.    . pravastatin (PRAVACHOL) 20 MG tablet Take 20 mg by mouth at bedtime.     . Probiotic Product (PROBIOTIC DAILY PO) Take 1 tablet by mouth daily.    . ramipril (ALTACE) 10 MG capsule Take 10 mg by mouth 2 (two) times daily.     . verapamil (CALAN-SR) 240 MG CR tablet Take 240 mg by mouth daily.      Home: Home Living Family/patient expects to be discharged to:: Private residence Living Arrangements: Alone Available Help at Discharge: Family, Available PRN/intermittently Type of Home: House Home Access: Stairs to enter [employer] of Steps: 2 Entrance Stairs-Rails: Left Home Layout: One level Bathroom Shower/Tub: Multimedia programmer: Handicapped height Home Equipment: [place] - single point, Environmental consultant -  2 wheels  Lives With: Alone  Functional History: Prior Function Level of Independence: Independent Comments: ADLs, IADLs, and driving. 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 Functional Status:  Mobility: Bed Mobility Overal bed mobility: Needs Assistance Bed Mobility: Supine to Sit Supine to sit: Min guard, HOB elevated General bed mobility comments: HOb 25degrees with increased time and use of rail pt able to pivot to EOB without physical assist, guarding for lines and safety Transfers Overall transfer level: Needs assistance Equipment used: Rolling walker (2 wheeled) Transfers: Sit to/from Stand Sit to Stand: Min assist General transfer comment: min assist to rise from bed and bSC with cues for hand placement Ambulation/Gait Ambulation/Gait assistance: Min assist Ambulation Distance (Feet): 80 Feet Assistive device: Rolling walker (2 wheeled) Gait Pattern/deviations: Step-through pattern, Decreased stride length, Trunk flexed General Gait Details: cues for  position in RW, assist to direct and move RW with turning, slow steady gait with reliance on RW Gait velocity interpretation: <1.31 ft/sec, indicative of household ambulator    ADL: ADL Overall ADL's : Needs assistance/impaired Eating/Feeding: Set up, Sitting Grooming: Wash/dry face, Set up, Sitting Upper Body Bathing: Minimal assistance, Sitting Lower Body Bathing: Maximal assistance, Sit to/from stand Upper Body Dressing : Minimal assistance, Sitting Lower Body Dressing: Maximal assistance, Sit to/from stand Lower Body Dressing Details (indicate cue type and reason): Max A to don socks at EOB. Pt able to don left sock with assistance to start task and then maintain left ankle on right knee. Toilet Transfer: Minimal assistance, +2 for safety/equipment, Stand-pivot, RW Toileting- Clothing Manipulation and Hygiene: Maximal assistance, Sit to/from stand, +2 for physical assistance Toileting - Clothing Manipulation Details (indicate cue type and reason): Pt able to maintain standing balance with Min A for right lateral lean while second person performs toilet hygiene. Pt too fatigued to perform self peri care Functional mobility during ADLs: Minimal assistance, Rolling walker(stand pivot only) General ADL Comments: Pt with decreased fucntional performance. Presenting with decreased activity tolerance, balance, strength, and cognition.   Cognition: Cognition Overall Cognitive Status: Impaired/Different from baseline Arousal/Alertness: Awake/alert Orientation Level: Oriented X4 Attention: Focused, Sustained, Selective, Alternating Focused Attention: Appears intact Sustained Attention: Impaired Sustained Attention Impairment: Verbal basic, Functional basic Selective Attention: Impaired Selective Attention Impairment: Verbal basic, Functional basic Alternating Attention: Impaired Alternating Attention Impairment: Verbal basic Memory: Impaired Memory Impairment: Storage deficit, Decreased  recall of new information(Verbal recall 10/15pt) Awareness: Impaired Awareness Impairment: Emergent impairment(pt felt testing did not go as well as she expected) Problem Solving: Impaired Problem Solving Impairment: Verbal basic(sustained attention impacting) Executive Function: Reasoning, Initiating Reasoning: Impaired Reasoning Impairment: Verbal basic, Functional basic Initiating: Impaired Initiating Impairment: Functional basic Behaviors: Perseveration, Other (comment)(avoidant) Cognition Arousal/Alertness: Awake/alert Behavior During Therapy: Flat affect Overall Cognitive Status: Impaired/Different from baseline Area of Impairment: Following commands, Safety/judgement, Problem solving Current Attention Level: Selective Following Commands: Follows one step commands consistently Awareness: Emergent Problem Solving: Slow processing General Comments: pt moving well and able to tolerate increased activity, increased time to initiate and slow processing  Blood pressure 135/77, pulse 92, temperature 98.2 F (36.8 C), temperature source Axillary, resp. rate (!) 21, weight 56.7 kg (125 lb), SpO2 96 %. Physical Exam  Vitals reviewed. Constitutional: She is oriented to person, place, and time. She appears well-developed.  84 year old frail female  HENT:  Head: Normocephalic and atraumatic.  Eyes: EOM are normal. Right eye exhibits no discharge. Left eye exhibits no discharge.  Neck: Normal range of motion. Neck supple. No thyromegaly present.  Cardiovascular: Normal rate and regular  rhythm.  Respiratory:  Fair inspiratory effort.  Clear to auscultation +[place]  GI: Soft. Bowel sounds are normal. She exhibits no distension.  Musculoskeletal:  No edema or tenderness in extremities  Neurological: She is alert and oriented to person, place, and time.  Dysarthria Motor: Bilateral upper extremities: 4 -/5 proximal to distal Left lower extremity: 4 -/5 proximal to distal Right lower  extremity: 4 -/5 proximal to distal (weaker than left side) Sensation intact to light touch  Skin: Skin is warm and dry.  Psychiatric: She has a normal mood and affect. Her behavior is normal.    Results for orders placed or performed during the hospital encounter of 10/29/17 (from the past 24 hour(s))  Basic metabolic panel     Status: Abnormal   Collection Time: 10/31/17  6:21 AM  Result Value Ref Range   Sodium 148 (H) 135 - 145 mmol/L   Potassium 3.0 (L) 3.5 - 5.1 mmol/L   Chloride 108 101 - 111 mmol/L   CO2 28 22 - 32 mmol/L   Glucose, Bld 241 (H) 65 - 99 mg/dL   BUN 20 6 - 20 mg/dL   Creatinine, Ser 1.17 (H) 0.44 - 1.00 mg/dL   Calcium 8.1 (L) 8.9 - 10.3 mg/dL   GFR calc non Af Amer 42 (L) >60 mL/min   GFR calc Af Amer 49 (L) >60 mL/min   Anion gap 12 5 - 15  CBC     Status: Abnormal   Collection Time: 10/31/17  6:21 AM  Result Value Ref Range   WBC 12.9 (H) 4.0 - 10.5 K/uL   RBC 4.51 3.87 - 5.11 MIL/uL   Hemoglobin 12.0 12.0 - 15.0 g/dL   HCT 39.9 36.0 - 46.0 %   MCV 88.5 78.0 - 100.0 fL   MCH 26.6 26.0 - 34.0 pg   MCHC 30.1 30.0 - 36.0 g/dL   RDW 18.6 (H) 11.5 - 15.5 %   Platelets 333 150 - 400 K/uL  Magnesium     Status: None   Collection Time: 10/31/17  6:21 AM  Result Value Ref Range   Magnesium 2.0 1.7 - 2.4 mg/dL  Troponin I (q 6hr x 3)     Status: Abnormal   Collection Time: 10/31/17  6:21 AM  Result Value Ref Range   Troponin I 2.57 (HH) <0.03 ng/mL  Glucose, capillary     Status: Abnormal   Collection Time: 10/31/17  8:06 AM  Result Value Ref Range   Glucose-Capillary 215 (H) 65 - 99 mg/dL  Sodium     Status: Abnormal   Collection Time: 10/31/17 11:40 AM  Result Value Ref Range   Sodium 147 (H) 135 - 145 mmol/L  Glucose, capillary     Status: Abnormal   Collection Time: 10/31/17 12:08 PM  Result Value Ref Range   Glucose-Capillary 242 (H) 65 - 99 mg/dL  Glucose, capillary     Status: Abnormal   Collection Time: 10/31/17  3:47 PM  Result Value Ref  Range   Glucose-Capillary 188 (H) 65 - 99 mg/dL  Sodium     Status: Abnormal   Collection Time: 10/31/17  5:22 PM  Result Value Ref Range   Sodium 147 (H) 135 - 145 mmol/L  Sodium     Status: None   Collection Time: 10/31/17 11:22 PM  Result Value Ref Range   Sodium 145 135 - 145 mmol/L  Glucose, capillary     Status: Abnormal   Collection Time: 10/31/17 11:33 PM  Result  Value Ref Range   Glucose-Capillary 218 (H) 65 - 99 mg/dL   Ct Head Wo Contrast  Result Date: 10/31/2017 CLINICAL DATA:  Follow-up intracranial hemorrhage. EXAM: CT HEAD WITHOUT CONTRAST TECHNIQUE: Contiguous axial images were obtained from the base of the skull through the vertex without intravenous contrast. COMPARISON:  CT HEAD Oct 29, 2016 FINDINGS: BRAIN: Evolving RIGHT cerebellar periventricular 1.5 x 2.7 cm hematoma, was 1.5 x 2.9 cm. Minimal surrounding low-density vasogenic edema with minimal regional mass effect. Intraventricular extension again noted. Ventricles and sulci are normal for patient's age. Patchy supratentorial white matter hypodensities compatible with moderate chronic small vessel ischemic changes. No midline shift or acute large vascular territory infarcts. No abnormal extra-axial fluid collections. VASCULAR: Mild calcific atherosclerosis of the carotid siphons. SKULL: No skull fracture. No significant scalp soft tissue swelling. SINUSES/ORBITS: Mild ethmoid mucosal thickening, hypoplastic sphenoid sinuses. Small LEFT mastoid effusion.The included ocular globes and orbital contents are non-suspicious. Status post bilateral ocular lens implants. OTHER: None. IMPRESSION: Evolving RIGHT cerebellar intraparenchymal hematoma with unchanged intraventricular extension. No hydrocephalus. Electronically Signed   By: Elon Alas M.D.   On: 10/31/2017 01:11   Dg Chest Port 1 View  Result Date: 10/31/2017 CLINICAL DATA:  Respiratory failure EXAM: PORTABLE CHEST 1 VIEW COMPARISON:  Portable exam 0510 hours  compared to 10/30/2017 FINDINGS: RIGHT arm PICC line tip projects over cavoatrial junction. Enlargement of cardiac silhouette. Atherosclerotic calcification aorta. Mediastinal contours and pulmonary vascularity normal. Improved pulmonary edema. Minimal atelectasis at LEFT lower lobe. No gross pleural effusion or pneumothorax. Bones demineralized. IMPRESSION: Enlargement of cardiac silhouette. Improved pulmonary edema. Electronically Signed   By: Lavonia Dana M.D.   On: 10/31/2017 07:44   [place] Ekg Site Rite  Result Date: 10/30/2017 If Site Rite image not attached, placement could not be confirmed due to current cardiac rhythm.   Assessment/Plan: Diagnosis: right cerebellar hemorrhage with cardiac debility Labs and images independently reviewed.  Records reviewed and summated above. Stroke: Continue secondary stroke prophylaxis and Risk Factor Modification listed below:   Blood Pressure Management:  Continue current medication with prn's with permisive HTN per primary team Diabetes management:    1. Does the need for close, 24 hr/day medical supervision in concert with the patient's rehab needs make it unreasonable for this patient to be served in a less intensive setting? Potentially  2. Co-Morbidities requiring supervision/potential complications: adenosarcoma of cervix, endometrial carcinoma status post total abdominal hysterectomy, DM (Monitor in accordance with exercise and adjust meds as necessary), polycythemia vera (follow labs), TIA, CHF (Monitor in accordance with increased physical activity and avoid UE resistance excercises), supplement oxygen dependent (wean as tolerated), HTN (monitor and provide prns in accordance with increased physical exertion and pain), hypernatremia (continue to monitor, wean hypertonic saline when appropriate), leukocytosis (cont to monitor for signs and symptoms of infection, further workup if indicated), ABLA (transfuse if necessary to ensure appropriate perfusion  for increased activity tolerance), AKI vs CKD (avoid nephrotoxic medications) 3. Due to bladder management, safety, disease management and patient education, does the patient require 24 hr/day rehab nursing? Yes 4. Does the patient require coordinated care of a physician, rehab nurse, PT (1-2 hrs/day, 5 days/week), OT (1-2 hrs/day, 5 days/week) and SLP (1-2 hrs/day, 5 days/week) to address physical and functional deficits in the context of the above medical diagnosis(es)? Yes Addressing deficits in the following areas: balance, endurance, locomotion, strength, transferring, bowel/bladder control, bathing, dressing, toileting, speech and psychosocial support 5. Can the patient actively participate in an intensive therapy program  of at least 3 hrs of therapy per day at least 5 days per week? No 6. The potential for patient to make measurable gains while on inpatient rehab is excellent and good 7. Anticipated functional outcomes upon discharge from inpatient rehab are supervision  with PT, supervision with OT, modified independent with SLP. 8. Estimated rehab length of stay to reach the above functional goals is: 7-12 days. 9. Anticipated D/C setting: Other 10. Anticipated post D/C treatments: SNF 11. Overall Rehab/Functional Prognosis: good  RECOMMENDATIONS: This patient's condition is appropriate for continued rehabilitative care in the following setting: Given significant comorbidities, patient more appropriate for less intense rehabilitation over a longer period of time. Further, patient without caregiver support at discharge with plans to reside in rehabilitation facility in [place] per family. At this time recommend SNF after medically stable and medical workup complete with PM&R outpatient follow up. Patient has agreed to participate in recommended program. Yes Note that insurance prior authorization may be required for reimbursement for recommended care.  Comment: Rehab Admissions  Coordinator to follow up.   I have personally performed a face to face diagnostic evaluation, including, but not limited to relevant history and physical exam findings, of this patient and developed relevant assessment and plan.  Additionally, I have reviewed and concur with the physician assistant's documentation above.   Delice Lesch, MD, ABPMR Lavon Paganini Angiulli, PA-C 11/01/2017

## 2017-11-01 NOTE — Progress Notes (Addendum)
Inpatient Rehabilitation  Met with patient and niece at bedside to discuss team's recommendation for IP Rehab.  Shared booklets, and answered initial questions.  Per their request will check benefits and provide a with insurance verification letter.  They are weighing CIR versus SNF for post acute rehab while considering therapy tolerance and close medical monitoring.  Plan to follow for timing of medical readiness, patient/family decision, insurance authorization, and IP Rehab bed availability.  Call if questions.    Carmelia Roller., CCC/SLP Admission Coordinator  [place]  Cell [phone]

## 2017-11-01 NOTE — Progress Notes (Signed)
Inpatient Diabetes Program Recommendations  AACE/ADA: New Consensus Statement on Inpatient Glycemic Control (2015)  Target Ranges:  Prepandial:   less than 140 mg/dL      Peak postprandial:   less than 180 mg/dL (1-2 hours)      Critically ill patients:  140 - 180 mg/dL   Lab Results  Component Value Date   GLUCAP 247 (H) 11/01/2017   HGBA1C 7.0 (H) 03/29/2015    Review of Glycemic ControlResults for ABCDE, ONEIL (MRN [id number]) as of 11/01/2017 14:01  Ref. Range 10/31/2017 12:08 10/31/2017 15:47 10/31/2017 23:33 11/01/2017 08:10 11/01/2017 12:43  Glucose-Capillary Latest Ref Range: 65 - 99 mg/dL 242 (H) 188 (H) 218 (H) 224 (H) 247 (H)   Diabetes history: Type 2 DM  Outpatient Diabetes medications: Metformin 1000 mg bid, Actos 15 mg daily Current orders for Inpatient glycemic control:  Novolog sensitive tid with meals  Inpatient Diabetes Program Recommendations:   Note that blood sugars>goal.  May consider adding Levemir 6 units q HS.    Thanks,  Adah Perl, RN, BC-ADM Inpatient Diabetes Coordinator Pager [phone] (8a-5p)

## 2017-11-01 NOTE — NC FL2 (Addendum)
[place] LEVEL OF CARE SCREENING TOOL     IDENTIFICATION  Patient Name: [name] Birthdate: [birth year] Sex: female Admission Date (Current Location): 10/29/2017  [facility] and [place] Number:  Engineering geologist and Address:  [facility] [place]. [facility], [place] [address], [place], [place] [address]      Provider Number: [id number]  Attending Physician Name and Address:  Garvin Fila, MD  Relative Name and Phone Number:       Current Level of Care: Hospital Recommended Level of Care: [place] Prior Approval Number:    Date Approved/Denied:   PASRR Number: [id number]  Discharge Plan: SNF    Current Diagnoses: Patient Active Problem List   Diagnosis Date Noted  . Right-sided nontraumatic intracerebral hemorrhage of cerebellum ([place])   . History of cervical cancer   . History of TIA (transient ischemic attack)   . Acute systolic congestive heart failure ([place])   . Reactive hypertension   . Hypernatremia   . Leukocytosis   . Acute blood loss anemia   . Elevated serum creatinine   . Acute respiratory failure with hypoxia ([place])   . IVH (intraventricular hemorrhage) ([place]) 10/29/2017  . Hypoxia 10/28/2017  . Pancreatic lesion 05/24/2017  . 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 Pulmonary nodule, left 06/04/2015  . 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 Cerebral thrombosis with cerebral infarction 04/03/2015  . Cancer of right colon ([place]) 03/28/2015  . CVA (cerebral infarction) 12/21/2014  . Polycythemia vera ([place]) 06/22/2006    Orientation RESPIRATION BLADDER Height & Weight     Self, Time, Situation, Place  O2(2L [place]) Incontinent, External catheter Weight: 125 lb (56.7 kg) Height:     BEHAVIORAL SYMPTOMS/MOOD NEUROLOGICAL BOWEL NUTRITION STATUS      Continent Diet(carb modified)  AMBULATORY STATUS COMMUNICATION OF NEEDS Skin   Limited Assist Verbally Normal                        Personal Care Assistance Level of Assistance  Bathing, Dressing Bathing Assistance: Limited assistance   Dressing Assistance: Limited assistance     Functional Limitations Info             SPECIAL CARE FACTORS FREQUENCY  PT (By licensed PT), OT (By licensed OT)     PT Frequency: 5/wk OT Frequency: 5/wk            Contractures      Additional Factors Info  Code Status, Allergies, Insulin Sliding Scale Code Status Info: Partial Allergies Info: Simvastatin   Insulin Sliding Scale Info: 3/day       Current Medications (11/01/2017):  This is the current hospital active medication list Current Facility-Administered Medications  Medication Dose Route Frequency Provider Last Rate Last Dose  . carvedilol (COREG) tablet 12.5 mg  12.5 mg Oral BID WC Charolette Forward, MD   12.5 mg at 11/01/17 0748  . Chlorhexidine Gluconate Cloth 2 % PADS 6 each  6 each Topical Daily Aroor, Lanice Schwab, MD   6 each at 11/01/17 1043  . clevidipine (CLEVIPREX) infusion 0.5 mg/mL  0-21 mg/hr Intravenous Continuous Aroor, Karena Addison R, MD      . enoxaparin (LOVENOX) injection 30 mg  30 mg Subcutaneous Q24H Biby, Sharon L, NP   30 mg at 11/01/17 1042  . furosemide (LASIX) injection 40 mg  40 mg Intravenous BID Charolette Forward, MD   40 mg at 11/01/17 0748  . insulin  aspart (novoLOG) injection 0-9 Units  0-9 Units Subcutaneous TID WC Aroor, Lanice Schwab, MD   3 Units at 11/01/17 1301  . labetalol (NORMODYNE,TRANDATE) injection 10 mg  10 mg Intravenous Q2H PRN Rinehuls, David L, PA-C   10 mg at 11/01/17 1302  . MEDLINE mouth rinse  15 mL Mouth Rinse BID Aroor, Karena Addison R, MD   15 mL at 11/01/17 1044  . pantoprazole (PROTONIX) EC tablet 40 mg  40 mg Oral Daily Charolette Forward, MD   40 mg at 11/01/17 1042  . potassium chloride SA (K-DUR,KLOR-CON) CR tablet 20 mEq  20 mEq Oral BID Charolette Forward, MD   20 mEq at 11/01/17 1042  . ramipril (ALTACE) capsule 10 mg  10 mg Oral BID Charolette Forward, MD      .  senna-docusate (Senokot-S) tablet 1 tablet  1 tablet Oral BID Aroor, Lanice Schwab, MD   1 tablet at 11/01/17 1042  . sodium chloride (hypertonic) 3 % solution   Intravenous Continuous Aroor, Lanice Schwab, MD 50 mL/hr at 11/01/17 1300    . sodium chloride flush (NS) 0.9 % injection 10-40 mL  10-40 mL Intracatheter Q12H Aroor, Lanice Schwab, MD   10 mL at 11/01/17 1044  . sodium chloride flush (NS) 0.9 % injection 10-40 mL  10-40 mL Intracatheter PRN Aroor, Lanice Schwab, MD         Discharge Medications: Please see discharge summary for a list of discharge medications.  Relevant Imaging Results:  Relevant Lab Results:   Additional Information SS#: [id number]  Jorge Ny, LCSW  I have personally examined this patient, reviewed notes, independently viewed imaging studies, participated in medical decision making and plan of care.ROS completed by me personally and pertinent positives fully documented  I have made any additions or clarifications directly to the above note. Agree with note above   Antony Contras, MD Medical Director [facility] Pager: [phone] 11/01/2017 3:18 PM

## 2017-11-01 NOTE — Progress Notes (Addendum)
STROKE TEAM PROGRESS NOTE   SUBJECTIVE (INTERVAL HISTORY) Her niece is at bedside (works at [employer]). Pt is headed toward MRI. Stable during the night.    OBJECTIVE Temp:  [97.8 F (36.6 C)-98.2 F (36.8 C)] 98.2 F (36.8 C) (05/13 0400) Pulse Rate:  [77-98] 85 (05/13 0800) Cardiac Rhythm: Normal sinus rhythm (05/13 0800) Resp:  [8-29] 26 (05/13 0800) BP: (121-158)/(73-99) 158/96 (05/13 0800) SpO2:  [93 %-100 %] 98 % (05/13 0800)  CBC:  Recent Labs  Lab 10/25/17 1236 10/28/17 1504  10/31/17 0621 11/01/17 0522  WBC 11.4* 26.1*   < > 12.9* 15.1*  NEUTROABS 9.3* 21.4*  --   --   --   HGB 12.3 14.4   < > 12.0 10.6*  HCT 39.1 45.9   < > 39.9 36.5  MCV 85.2 86.1   < > 88.5 90.3  PLT 453* 698*   < > 333 269   < > = values in this interval not displayed.    Basic Metabolic Panel:  Recent Labs  Lab 10/30/17 0249  10/31/17 0621  10/31/17 2322 11/01/17 0522  NA 143   < > 148*   < > 145 151*  K 2.8*  --  3.0*  --   --  3.5  CL 104  --  108  --   --  115*  CO2 26  --  28  --   --  24  GLUCOSE 229*  --  241*  --   --  233*  BUN 26*  --  20  --   --  21*  CREATININE 1.21*  --  1.17*  --   --  1.23*  CALCIUM 8.3*  --  8.1*  --   --  7.7*  MG 1.3*  --  2.0  --   --  1.6*  PHOS 3.4  --   --   --   --   --    < > = values in this interval not displayed.    Lipid Panel:     Component Value Date/Time   CHOL 148 12/20/2014 1533   TRIG 130 12/20/2014 1533   HDL 44 12/20/2014 1533   CHOLHDL 3.4 12/20/2014 1533   VLDL 26 12/20/2014 1533   LDLCALC 78 12/20/2014 1533   HgbA1c:  Lab Results  Component Value Date   HGBA1C 7.0 (H) 03/29/2015   Urine Drug Screen:     Component Value Date/Time   LABOPIA NONE DETECTED 12/20/2014 1639   COCAINSCRNUR NONE DETECTED 12/20/2014 1639   LABBENZ NONE DETECTED 12/20/2014 1639   AMPHETMU NONE DETECTED 12/20/2014 1639   THCU NONE DETECTED 12/20/2014 1639   LABBARB NONE DETECTED 12/20/2014 1639    Alcohol Level      Component Value Date/Time   ETH <5 12/20/2014 1533    IMAGING Ct Head Wo Contrast 10/29/2017 Stable RIGHT cerebellar hemorrhage, with extension into the IV ventricle without hydrocephalus.   Ct Head Wo Contrast 10/29/2017 RIGHT cerebellar parenchymal hemorrhage approximately 31 x 16 x 19 mm. There is extension of hemorrhage into the fourth ventricle, without hydrocephalus. A hypertensive bleed is most likely, but unrecognized trauma, anticoagulation, vascular malformation, hemorrhagic metastatic neoplasm, are all considerations.   Ct Head Wo Contrast 10/31/2017 Stable RIGHT cerebellar parenchymal hemorrhage, IVH with no hydrocephalus   MRI head 11/01/2017 1. Unchanged right cerebellar hematoma with mild intraventricular extension. No hydrocephalus. 2. No acute infarct separate from the hematoma. 3. Moderate chronic small vessel ischemic disease.  Transthoracic Echocardiogram  10/29/2017 - Left ventricle: The cavity size was severely dilated. Systolic function was severely reduced. The estimated ejection fraction was 25%. Akinesis of the anteroseptal myocardium. Akinesis of the anterior myocardium. - Aortic valve: There was trivial regurgitation. Valve area (VTI): 1.68 cm^2. Valve area (Vmax): 1.9 cm^2. Valve area (Vmean): 1.81 cm^2. - Mitral valve: There was mild regurgitation. Valve area by continuity equation (using LVOT flow): 2.19 cm^2. - Left atrium: The atrium was mildly dilated. - Right ventricle: The cavity size was mildly dilated. - Right atrium: The atrium was mildly dilated. - Pericardium, extracardiac: A trivial pericardial effusion was identified posterior to the heart. Features were not consistent with tamponade physiology. There was a left pleural effusion. - 4 Chamber dilatation with severe LV systolic dysfunction with anteroapical akinesis sugestive ASWMI, and no thrombii present in LV. Fibrocalcified aortic valve without AS.  Dg Chest 1 View 10/28/2017 1. Cardiomegaly  with new perihilar opacities, presumably pulmonary edema, suggesting CHF/volume overload. Pneumonia is considered less likely.  2. Aortic atherosclerosis.   Ct Angio Chest Pe W And/or Wo Contrast 10/28/2017 1. Pericardial effusion and cardiomegaly.  Coronary artery disease.  2. Parenchymal changes consistent with pulmonary edema and small pleural effusions.  3. 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 Recommend annual imaging followup by CTA or MRA. This recommendation follows 2010 ACCF/AHA/AATS/ACR/ASA/SCA/SCAI/SIR/STS/SVM Guidelines for the Diagnosis and Management of Patients with Thoracic Aortic Disease. Circulation.2010; 121: F810-F751  4. Aortic aneurysm NOS (ICD10-I71.9). Aortic Atherosclerosis (ICD10-I70.0).  5. Stable probable scar at the LEFT lung apex. 6. Numerous thoracic compression fractures.   Dg Chest Port 1 View 10/30/2017 Cardiomegaly with findings of CHF and small bilateral pleural effusions. No pneumothorax.   Left Heart Cath - Neoma Laming MD [facility] 10/29/2017  Ost Cx to Prox Cx lesion is 80% stenosed with 90% stenosed side branch in Ost 1st Mrg to 1st Mrg.  Mid LAD lesion is 95% stenosed.  Mid RCA lesion is 75% stenosed.  Ost LAD to Prox LAD lesion is 70% stenosed. Severe 3 vessel disease with severe LV systolic dysfunction and anteroapical aneurysm. Advise [facility]G at [facility].   PHYSICAL EXAM    General -well developed elderly female in no acute distress Heart - Regular rate and rhythm - no murmer appreciated Lungs - Clear to auscultation anteriorly Extremities - Distal pulses intact - no edema Skin - Warm and dry, R forearm IV infiltrate, normal temperature, small area, mild erythema   Mental Status: awake, alert oriented to self and place and time, follows commands,mild dysarthria. No aphasia. pupils reactive B/L, EOMI -  Dysmetric saccadic movement to the R, visual field intact, speech  dysarthric, face symmetric, motor 5/5 all group, sensory intact, slow finger to nose B/L   ASSESSMENT/PLAN Ms. DAILY DOE is a 84 y.o. female with history of previous stroke, recurrent falls, Polycythemia Vera, Microalbuminuria, HTN, TIA, Endometrial Carcinoma, Diabetes Mellitus, Colon Cancer, Breast Cancer, Arthritis, Adenocarcinoma in Situ of Cervix, severe three-vessel coronary artery disease, ejection fraction 25%, and congestive heart failure presented to [facility] hospital with black emesis and unresponsive 2/2 hypoxia transferred to [facility] with a RIGHT cerebellar parenchymal hemorrhage.    Stroke:  RIGHT cerebellar parenchymal hemorrhage, etiology hypertensive plus dual antiplatelet therapy  CT head - RIGHT cerebellar parenchymal hemorrhage approximately 31 x 16 x 19 mm extension of hemorrhage into the fourth ventricle, stable on a follow up [facility] 5/12 stable ICH/IVH no Hydrocephalus    MRI head - stable  R cerebellar hmg, no hydro, Small vessel disease.   At risk for hydrocephalus, on hyperosmolar therapy 3% nacl, keep NA 140-150 currently at 50 ML/HR with NA 151  Neurosurgery consult appreciated, no surgical intervention  Carotid Doppler - not indicated  2D Echo - as above EF 25%  LDL - 78  HgbA1c - 7.0  VTE prophylaxis - SCDs. Changed to lovenox as 2 days out and hmg stable.  clopidogrel 75 mg daily prior to admission  Ongoing aggressive stroke risk factor management  Therapy recommendations:  CIR. Admissions coordinator is following  Disposition:  Pending  Hypertensive Emergency  BP elevated on arrival 174/113  Treated w/ prn labetalol . Increase Systolic blood pressure goal less than 180  . Home meds:  Coreg 6.25 bid, altace 10 mg bid, verapamil 240 CR daily . Now on: Coreg 12.5 bid, altace 10 mg bid . Stable now . Long-term BP goal normotensive  Hyperlipidemia  Lipid lowering medication PTA:  Pravachol 20 mg daily  LDL 78, goal < 70  Current lipid lowering  medication: none  Resume Pravachol at discharge. (muscle aches with simvastatin)  Diabetes  HgbA1c 7.0, goal < 7.0  Outpatient Diabetes medications: Metformin 1000 mg bid, Actos 15 mg daily  DB RN following. Add levemir 6u q hs  Other Stroke Risk Factors  Advanced age  Former cigarette smoker - quit  Hx stroke/TIA  Coronary artery disease - Severe 3 vessel CAD - NSTEMI  resolveing decompensated systolic CHF, tx w/ lasix, EF 25%  Other Active Problems/plan   BUN 26 ; creatinine 1.2  Hematemesis - GI consulted at [facility]. On Protonix. 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 Agree with note above. I had a long discussion with the patient and her neice at the bedside and answered questions.plan check MRI scan of the brain today. Mobilize out of bed.  Very need to restart aspirin in a few days given patient's critical coronary artery disease and probably Plavix after 2-3 weeks.. Discussed with Dr. Terrence Dupont cardiologist. and answered questions This patient is critically ill and at significant risk of neurological worsening, death and care requires constant monitoring of vital signs, hemodynamics,respiratory and cardiac monitoring, extensive review of multiple databases, frequent neurological assessment, discussion with family, other specialists and medical decision making of high complexity.I have made any  additions or clarifications directly to the above note.This critical care time does not reflect procedure time, or teaching time or supervisory time of PA/NP/Med Resident etc but could involve care discussion time.  I spent 30 minutes of neurocritical care time  in the care of  this patient.     Antony Contras, MD Medical Director [place] Pager: [phone] 11/01/2017 3:54 PM   To contact Stroke Continuity provider, please refer to [email or web address] After hours, contact General Neurology

## 2017-11-01 NOTE — Progress Notes (Signed)
Subjective:  Patient denies any chest pain.  States her breathing has improved.  Objective:  Vital Signs in the last 24 hours: Temp:  [97.8 F (36.6 C)-98.3 F (36.8 C)] 98.3 F (36.8 C) (05/13 0800) Pulse Rate:  [77-98] 82 (05/13 1100) Resp:  [8-29] 19 (05/13 1100) BP: (99-158)/(73-99) 99/81 (05/13 1100) SpO2:  [93 %-100 %] 99 % (05/13 1100)  Intake/Output from previous day: 05/12 0701 - 05/13 0700 In: 1510 [P.O.:360; I.V.:1150] Out: 1050 [Urine:1050] Intake/Output from this shift: Total I/O In: 650 [P.O.:400; I.V.:250] Out: 350 [Urine:350]  Physical Exam: Neck: no adenopathy, no carotid bruit, no JVD and supple, symmetrical, trachea midline Lungs: decreased breath sounds at bases with faint rales Heart: regular rate and rhythm, S1, S2 normal and soft systolic murmur and S3 gallop noted Abdomen: soft, non-tender; bowel sounds normal; no masses,  no organomegaly Extremities: extremities normal, atraumatic, no cyanosis or edema  Lab Results: Recent Labs    10/31/17 0621 11/01/17 0522  WBC 12.9* 15.1*  HGB 12.0 10.6*  PLT 333 269   Recent Labs    10/31/17 0621  10/31/17 2322 11/01/17 0522  NA 148*   < > 145 151*  K 3.0*  --   --  3.5  CL 108  --   --  115*  CO2 28  --   --  24  GLUCOSE 241*  --   --  233*  BUN 20  --   --  21*  CREATININE 1.17*  --   --  1.23*   < > = values in this interval not displayed.   Recent Labs    10/31/17 0621  TROPONINI 2.57*   Hepatic Function Panel No results for input(s): PROT, ALBUMIN, AST, ALT, ALKPHOS, BILITOT, BILIDIR, IBILI in the last 72 hours. No results for input(s): CHOL in the last 72 hours. No results for input(s): PROTIME in the last 72 hours.  Imaging: Imaging results have been reviewed and Ct Head Wo Contrast  Result Date: 10/31/2017 CLINICAL DATA:  Follow-up intracranial hemorrhage. EXAM: CT HEAD WITHOUT CONTRAST TECHNIQUE: Contiguous axial images were obtained from the base of the skull through the vertex  without intravenous contrast. COMPARISON:  CT HEAD Oct 29, 2016 FINDINGS: BRAIN: Evolving RIGHT cerebellar periventricular 1.5 x 2.7 cm hematoma, was 1.5 x 2.9 cm. Minimal surrounding low-density vasogenic edema with minimal regional mass effect. Intraventricular extension again noted. Ventricles and sulci are normal for patient's age. Patchy supratentorial white matter hypodensities compatible with moderate chronic small vessel ischemic changes. No midline shift or acute large vascular territory infarcts. No abnormal extra-axial fluid collections. VASCULAR: Mild calcific atherosclerosis of the carotid siphons. SKULL: No skull fracture. No significant scalp soft tissue swelling. SINUSES/ORBITS: Mild ethmoid mucosal thickening, hypoplastic sphenoid sinuses. Small LEFT mastoid effusion.The included ocular globes and orbital contents are non-suspicious. Status post bilateral ocular lens implants. OTHER: None. IMPRESSION: Evolving RIGHT cerebellar intraparenchymal hematoma with unchanged intraventricular extension. No hydrocephalus. Electronically Signed   By: Elon Alas M.D.   On: 10/31/2017 01:11   Mr Brain Wo Contrast  Result Date: 11/01/2017 CLINICAL DATA:  Cerebellar hemorrhage. EXAM: MRI HEAD WITHOUT CONTRAST TECHNIQUE: Multiplanar, multiecho pulse sequences of the brain and surrounding structures were obtained without intravenous contrast. COMPARISON:  10/31/2017 FINDINGS: Brain: 2.9 x 1.5 cm hematoma in the right cerebellum has not significantly changed in size and again is noted to extend into the fourth ventricle, and there are trace blood products in the occipital horns of both lateral ventricles. Mild surrounding edema  in the right cerebellar hemisphere is likely unchanged. The fourth ventricle remains patent, and there is no evidence of obstructive hydrocephalus. No acute infarct is identified separate from the hematoma, and no underlying mass is evident on this unenhanced study. Patchy to  confluent T2 hyperintensities throughout the cerebral white matter nonspecific but compatible with moderate chronic small vessel ischemic disease. There is mild-to-moderate cerebral atrophy. There is no midline shift or extra-axial fluid collection. Vascular: Major intracranial vascular flow voids are preserved. Skull and upper cervical spine: Unremarkable bone marrow signal. Sinuses/Orbits: Bilateral cataract extraction. Clear paranasal sinuses. Small right and moderate left mastoid effusions. Other: None. IMPRESSION: 1. Unchanged right cerebellar hematoma with mild intraventricular extension. No hydrocephalus. 2. No acute infarct separate from the hematoma. 3. Moderate chronic small vessel ischemic disease. Electronically Signed   By: Logan Bores M.D.   On: 11/01/2017 10:30   Dg Chest Port 1 View  Result Date: 10/31/2017 CLINICAL DATA:  Respiratory failure EXAM: PORTABLE CHEST 1 VIEW COMPARISON:  Portable exam 0510 hours compared to 10/30/2017 FINDINGS: RIGHT arm PICC line tip projects over cavoatrial junction. Enlargement of cardiac silhouette. Atherosclerotic calcification aorta. Mediastinal contours and pulmonary vascularity normal. Improved pulmonary edema. Minimal atelectasis at LEFT lower lobe. No gross pleural effusion or pneumothorax. Bones demineralized. IMPRESSION: Enlargement of cardiac silhouette. Improved pulmonary edema. Electronically Signed   By: Lavonia Dana M.D.   On: 10/31/2017 07:44    Cardiac Studies:  Assessment/Plan:  Status post acute anteroseptal wall MI with atypical presentation Multivessel calcific 3 vessel disease Resolving decompensated systolic congestive heart failure Acute right cerebellar bleed with extension to IVH Hypertension Diabetes mellitus History of polycythemia vera History of TIA in the past Osteoporosis Hypokalemia Plan Increase ramipril to 10 mg twice daily. Check labs in a.m. Ambulate as tolerated   LOS: 3 days    [name] 11/01/2017,  11:35 AM

## 2017-11-02 ENCOUNTER — Other Ambulatory Visit: Payer: Self-pay

## 2017-11-02 LAB — BASIC METABOLIC PANEL
Anion gap: 8 (ref 5–15)
BUN: 23 mg/dL — ABNORMAL HIGH (ref 6–20)
CO2: 25 mmol/L (ref 22–32)
Calcium: 7.9 mg/dL — ABNORMAL LOW (ref 8.9–10.3)
Chloride: 116 mmol/L — ABNORMAL HIGH (ref 101–111)
Creatinine, Ser: 1.21 mg/dL — ABNORMAL HIGH (ref 0.44–1.00)
GFR calc Af Amer: 47 mL/min — ABNORMAL LOW (ref >60)
GFR calc non Af Amer: 40 mL/min — ABNORMAL LOW (ref >60)
Glucose, Bld: 182 mg/dL — ABNORMAL HIGH (ref 65–99)
Potassium: 3.8 mmol/L (ref 3.5–5.1)
Sodium: 149 mmol/L — ABNORMAL HIGH (ref 135–145)

## 2017-11-02 LAB — CBC
HCT: 36.3 % (ref 36.0–46.0)
Hemoglobin: 10.6 g/dL — ABNORMAL LOW (ref 12.0–15.0)
MCH: 26.4 pg (ref 26.0–34.0)
MCHC: 29.2 g/dL — ABNORMAL LOW (ref 30.0–36.0)
MCV: 90.3 fL (ref 78.0–100.0)
Platelets: 249 10*3/uL (ref 150–400)
RBC: 4.02 MIL/uL (ref 3.87–5.11)
RDW: 19 % — ABNORMAL HIGH (ref 11.5–15.5)
WBC: 18.4 10*3/uL — ABNORMAL HIGH (ref 4.0–10.5)

## 2017-11-02 LAB — URINALYSIS, ROUTINE W REFLEX MICROSCOPIC
Bilirubin Urine: NEGATIVE
Glucose, UA: NEGATIVE mg/dL
Hgb urine dipstick: NEGATIVE
Ketones, ur: NEGATIVE mg/dL
Leukocytes, UA: NEGATIVE
Nitrite: NEGATIVE
Protein, ur: NEGATIVE mg/dL
Specific Gravity, Urine: 1.009 (ref 1.005–1.030)
pH: 5 (ref 5.0–8.0)

## 2017-11-02 LAB — CULTURE, BLOOD (ROUTINE X 2)
Culture: NO GROWTH
Culture: NO GROWTH
Special Requests: ADEQUATE

## 2017-11-02 LAB — MAGNESIUM: Magnesium: 1.5 mg/dL — ABNORMAL LOW (ref 1.7–2.4)

## 2017-11-02 LAB — SODIUM
Sodium: 158 mmol/L — ABNORMAL HIGH (ref 135–145)
Sodium: 146 mmol/L — ABNORMAL HIGH (ref 135–145)
Sodium: 143 mmol/L (ref 135–145)
Sodium: 144 mmol/L (ref 135–145)

## 2017-11-02 LAB — GLUCOSE, CAPILLARY
Glucose-Capillary: 144 mg/dL — ABNORMAL HIGH (ref 65–99)
Glucose-Capillary: 203 mg/dL — ABNORMAL HIGH (ref 65–99)
Glucose-Capillary: 161 mg/dL — ABNORMAL HIGH (ref 65–99)
Glucose-Capillary: 170 mg/dL — ABNORMAL HIGH (ref 65–99)

## 2017-11-02 MED ORDER — SPIRONOLACTONE 25 MG PO TABS
25.0000 mg | ORAL_TABLET | Freq: Every day | ORAL | Status: DC
  Administered 2017-11-02 – 2017-11-04 (×3): 25 mg via ORAL
  Filled 2017-11-02 (×4): qty 1

## 2017-11-02 MED ORDER — POTASSIUM CHLORIDE CRYS ER 10 MEQ PO TBCR
10.0000 meq | EXTENDED_RELEASE_TABLET | Freq: Two times a day (BID) | ORAL | Status: DC
  Administered 2017-11-02 – 2017-11-03 (×3): 10 meq via ORAL
  Filled 2017-11-02 (×3): qty 1

## 2017-11-02 MED ORDER — HYDROXYUREA 500 MG PO CAPS
500.0000 mg | ORAL_CAPSULE | ORAL | Status: DC
  Administered 2017-11-02 – 2017-11-04 (×3): 500 mg via ORAL
  Filled 2017-11-02 (×3): qty 1

## 2017-11-02 NOTE — Progress Notes (Signed)
Occupational Therapy Treatment Patient Details Name: [name] MRN: [id number] DOB: September 19, 1931 Today's Date: 11/02/2017    History of present illness 84 year old female with PMhx: HTN, DM, TIA, DJD, breast and colon CA. Admitted to [facility] with acute onset of confusion and lethargy, generalized weakness and shortness of breath. Pt s/p left heart cath with 3 vessel CAD, CHF and acute MI. CT which showed right cerebellar hemorrhage with IVH.   OT comments  Pt completed sink level grooming in standing this session but requires cues for sequence. Pt very soft spoken and time and needs increase time to express words due to word finding difficulty. Pt thanking therapist for helping her today.    Follow Up Recommendations  CIR;Supervision/Assistance - 24 hour    Equipment Recommendations  3 in 1 bedside commode;Hospital bed    Recommendations for Other Services PT consult;Speech consult    Precautions / Restrictions Precautions Precautions: Fall Precaution Comments: oxygen tank for ambulation       Mobility Bed Mobility Overal bed mobility: Needs Assistance Bed Mobility: Supine to Sit     Supine to sit: Min assist     General bed mobility comments: exiting the bed on L side with (A) to elevate trunk off bed surface. pt reports dizziness initially  Transfers Overall transfer level: Needs assistance Equipment used: Rolling walker (2 wheeled) Transfers: Sit to/from Stand Sit to Stand: Mod assist         General transfer comment: pt needs (A) To power up and surface elevated to help with power up    Balance Overall balance assessment: Needs assistance Sitting-balance support: Bilateral upper extremity supported;Feet supported Sitting balance-Leahy Scale: Fair     Standing balance support: Bilateral upper extremity supported;During functional activity Standing balance-Leahy Scale: Poor Standing balance comment: bil ue required in standing                            ADL either performed or assessed with clinical judgement   ADL Overall ADL's : Needs assistance/impaired Eating/Feeding: Set up Eating/Feeding Details (indicate cue type and reason): niece often holding cups for patient and placing to mouth Grooming: Wash/dry face;Oral care;Standing Grooming Details (indicate cue type and reason): mod cues; min (A) standing to min guard (A) lateral support L UE             Lower Body Dressing: Maximal assistance   Toilet Transfer: Moderate assistance;BSC Toilet Transfer Details (indicate cue type and reason): requires cues for bil UE on BSC. pt needs cues to anterior weight shift. pt holding RW and pushing backward         Functional mobility during ADLs: +2 for safety/equipment;+2 for physical assistance;Moderate assistance;Rolling walker General ADL Comments: Pt requires cues for hand placement and to turn RW. pt needs physical (A) to advance RW at times due to fatigue. pt with stable VSS throughout session. Pt with HR 89-94. pt with RR 28-44 with decr to 84% RA with transfer. pt static standing at sink 89-93% RA   Pt is from [place] and likes to ask staff if they are from the area. Pt finds comfort in finding a reliable topic.   Vision   Additional Comments: pt closing eyes alot during session.    Perception     Praxis      Cognition Arousal/Alertness: Awake/alert Behavior During Therapy: Flat affect Overall Cognitive Status: Impaired/Different from baseline Area of Impairment: Attention;Following commands;Safety/judgement;Awareness  Current Attention Level: Selective   Following Commands: Follows one step commands with increased time;Follows multi-step commands inconsistently Safety/Judgement: Decreased awareness of deficits Awareness: Emergent Problem Solving: Slow processing;Decreased initiation;Difficulty sequencing General Comments: pt requires set by set processing to complete oral care. pt  stops and states "what do you want me to do now"        Exercises     Shoulder Instructions       General Comments niece present during session.     Pertinent Vitals/ Pain       Pain Assessment: Faces Faces Pain Scale: Hurts little more Pain Location: generalized pain all over Pain Descriptors / Indicators: Discomfort Pain Intervention(s): Monitored during session;Premedicated before session;Repositioned  Home Living                                          Prior Functioning/Environment              Frequency  Min 2X/week        Progress Toward Goals  OT Goals(current goals can now be found in the care plan section)  Progress towards OT goals: Progressing toward goals  Acute Rehab OT Goals Patient Stated Goal: plant flowers OT Goal Formulation: With patient/family Time For Goal Achievement: 11/13/17 Potential to Achieve Goals: Good ADL Goals Pt Will Perform Grooming: with min guard assist;standing Pt Will Perform Upper Body Dressing: with set-up;with supervision;sitting Pt Will Perform Lower Body Dressing: with min guard assist;sit to/from stand Pt Will Transfer to Toilet: with min guard assist;ambulating;bedside commode Pt Will Perform Toileting - Clothing Manipulation and hygiene: with min guard assist;sit to/from stand Additional ADL Goal #1: Pt will perform bed mobility at Mod I level with increased time in preparation for ADLs  Plan Discharge plan needs to be updated    Co-evaluation                 AM-PAC PT "6 Clicks" Daily Activity     Outcome Measure   Help from another person eating meals?: A Little Help from another person taking care of personal grooming?: A Little Help from another person toileting, which includes using toliet, bedpan, or urinal?: A Lot Help from another person bathing (including washing, rinsing, drying)?: A Lot Help from another person to put on and taking off regular upper body clothing?: A  Lot Help from another person to put on and taking off regular lower body clothing?: A Lot 6 Click Score: 14    End of Session Equipment Utilized During Treatment: Rolling walker  OT Visit Diagnosis: Unsteadiness on feet (R26.81);Other abnormalities of gait and mobility (R26.89);Muscle weakness (generalized) (M62.81);Other symptoms and signs involving cognitive function   Activity Tolerance Patient tolerated treatment well   Patient Left in chair;with call bell/phone within reach;with family/visitor present   Nurse Communication Mobility status        Time: 7846-9629 OT Time Calculation (min): 33 min  Charges: OT General Charges $OT Visit: 1 Visit OT Treatments $Self Care/Home Management : 8-22 mins   Jeri Modena   OTR/L Pager: [phone] Office: [phone] .    Parke Poisson B 11/02/2017, 12:32 PM

## 2017-11-02 NOTE — Progress Notes (Signed)
Inpatient Rehabilitation  Met with patient and niece at bedside to further discuss post acute rehab options.  Provided insurance verification letter and answered questions to assist with discharge planning.  Will continue to follow for timing of medical readiness, patient/family decision, insurance approval, and IP Rehab bed availability.  Call if questions.    , M.A., CCC/SLP Admission Coordinator  [facility]  Cell [phone]  

## 2017-11-02 NOTE — Progress Notes (Signed)
Physical Therapy Treatment Patient Details Name: [name] MRN: [id number] DOB: [birth year] Today's Date: 11/02/2017    History of Present Illness 84 year old female with PMhx: HTN, DM, TIA, DJD, breast and colon CA. Admitted to [facility] [place] with acute onset of confusion and lethargy, generalized weakness and shortness of breath. Pt s/p left heart cath with 3 vessel CAD, CHF and acute MI. CT which showed right cerebellar hemorrhage with IVH.    PT Comments    With encouragement pt agreed to participate.  She needed min to mod assist overall for basic mobility and gait.  Emphasis on transitions, sit to stand, activity tolerance doing long standing activity at the sink and gait stability and stamina.    Follow Up Recommendations  CIR;Supervision/Assistance - 24 hour     Equipment Recommendations  Rolling walker with 5" wheels    Recommendations for Other Services       Precautions / Restrictions Precautions Precautions: Fall Precaution Comments: oxygen tank for ambulation    Mobility  Bed Mobility Overal bed mobility: Needs Assistance Bed Mobility: Supine to Sit     Supine to sit: Min assist     General bed mobility comments: exiting the bed on L side with (A) to elevate trunk off bed surface. pt reports dizziness initially  Transfers Overall transfer level: Needs assistance Equipment used: Rolling walker (2 wheeled) Transfers: Sit to/from Stand Sit to Stand: Mod assist         General transfer comment: pt needs (A) To power up and surface elevated to help with power up  Ambulation/Gait Ambulation/Gait assistance: Min assist Ambulation Distance (Feet): 80 Feet Assistive device: Rolling walker (2 wheeled) Gait Pattern/deviations: Step-through pattern;Decreased stride length;Trunk flexed Gait velocity: slower   General Gait Details: cues for postural check, positioning in the RW, needed occasional stability assist and control of the RW.   Stairs              Wheelchair Mobility    Modified Rankin (Stroke Patients Only) Modified Rankin (Stroke Patients Only) Modified Rankin: Moderately severe disability     Balance Overall balance assessment: Needs assistance Sitting-balance support: Bilateral upper extremity supported;Feet supported Sitting balance-Leahy Scale: Fair     Standing balance support: Bilateral upper extremity supported;During functional activity Standing balance-Leahy Scale: Poor Standing balance comment: bil ue required in standing                            Cognition Arousal/Alertness: Awake/alert Behavior During Therapy: Flat affect Overall Cognitive Status: Impaired/Different from baseline Area of Impairment: Attention;Following commands;Safety/judgement;Awareness                   Current Attention Level: Selective   Following Commands: Follows one step commands with increased time;Follows multi-step commands inconsistently Safety/Judgement: Decreased awareness of deficits Awareness: Emergent Problem Solving: Slow processing;Decreased initiation;Difficulty sequencing General Comments: pt requires set by set processing to complete oral care. pt stops and states "what do you want me to do now"      Exercises Other Exercises Other Exercises: warm up AROM bil knees/hips x10 reps with graded resistance gross extention.    General Comments General comments (skin integrity, edema, etc.): niece present for the session      Pertinent Vitals/Pain Pain Assessment: Faces Faces Pain Scale: Hurts little more Pain Location: generalized pain all over Pain Descriptors / Indicators: Discomfort Pain Intervention(s): Monitored during session    Home Living Family/patient expects to be discharged to:: Inpatient rehab  Living Arrangements: Alone                  Prior Function            PT Goals (current goals can now be found in the care plan section) Acute Rehab PT Goals Patient  Stated Goal: plant flowers PT Goal Formulation: With patient Time For Goal Achievement: 11/14/17 Potential to Achieve Goals: Good Progress towards PT goals: Progressing toward goals    Frequency    Min 3X/week      PT Plan Current plan remains appropriate    Co-evaluation              AM-PAC PT "6 Clicks" Daily Activity  Outcome Measure  Difficulty turning over in bed (including adjusting bedclothes, sheets and blankets)?: A Little Difficulty moving from lying on back to sitting on the side of the bed? : A Lot Difficulty sitting down on and standing up from a chair with arms (e.g., wheelchair, bedside commode, etc,.)?: Unable Help needed moving to and from a bed to chair (including a wheelchair)?: A Little Help needed walking in hospital room?: A Little Help needed climbing 3-5 steps with a railing? : A Lot 6 Click Score: 14    End of Session   Activity Tolerance: Patient tolerated treatment well Patient left: in chair;with call bell/phone within reach;with nursing/sitter in room Nurse Communication: Mobility status PT Visit Diagnosis: Other abnormalities of gait and mobility (R26.89);Muscle weakness (generalized) (M62.81);Difficulty in walking, not elsewhere classified (R26.2)     Time: 9678-9381 PT Time Calculation (min) (ACUTE ONLY): 36 min  Charges:  $Gait Training: 8-22 mins                    G Codes:       11/11/17  Donnella Sham, PT [phone]  (pager)   Tessie Fass Alphonso Gregson 11-Nov-2017, 2:10 PM

## 2017-11-02 NOTE — Progress Notes (Signed)
SLP Cancellation Note  Patient Details Name: [name] MRN: [id number] DOB: 10/05/1931   Cancelled treatment:       Reason Eval/Treat Not Completed: Fatigue/lethargy limiting ability to participate.  Pt asleep upon arrival, awakened easily to voice but reported that she was "too tuckered out" for therapy.  She requested for therapist to come back at another time despite education regarding rationale for ST interventions.  Will follow up as pt is available.  RN reports that pt typically participates better in the morning versus afternoon.     Jennier Schissler, Selinda Orion 11/02/2017, 2:08 PM

## 2017-11-02 NOTE — Progress Notes (Signed)
Inpatient Rehabilitation  Received call from patient's niece, [name] that they would prefer an IP Rehab stay.  Will initiate insurance authorization and continue to follow for timing of medical readiness.  Call if questions.   Carmelia Roller., CCC/SLP Admission Coordinator  [facility]  Cell [phone]

## 2017-11-02 NOTE — Progress Notes (Signed)
Subjective:  Pain well denies any chest pain or shortness of breath.  Objective:  Vital Signs in the last 24 hours: Temp:  [97.8 F (36.6 C)-99 F (37.2 C)] 97.9 F (36.6 C) (05/14 0400) Pulse Rate:  [78-97] 81 (05/14 0800) Resp:  [18-33] 28 (05/14 0800) BP: (98-149)/(71-100) 143/97 (05/14 0800) SpO2:  [93 %-100 %] 96 % (05/14 0800)  Intake/Output from previous day: 05/13 0701 - 05/14 0700 In: 2180 [P.O.:880; I.V.:1300] Out: 550 [Urine:550] Intake/Output from this shift: Total I/O In: 50 [I.V.:50] Out: -   Physical Exam: Neck: no adenopathy, no carotid bruit, no JVD and supple, symmetrical, trachea midline Lungs: bilateral decreased breath sound at bases with occasional rhonchi and rales Heart: regular rate and rhythm, S1, S2 normal and soft systolic murmur and S3 gallop noted  Abdomen: soft, non-tender; bowel sounds normal; no masses,  no organomegaly Extremities: extremities normal, atraumatic, no cyanosis or edema  Lab Results: Recent Labs    11/01/17 0522 11/02/17 0530  WBC 15.1* 18.4*  HGB 10.6* 10.6*  PLT 269 249   Recent Labs    11/01/17 0522  11/01/17 2320 11/02/17 0530  NA 151*   < > 158* 149*  K 3.5  --   --  3.8  CL 115*  --   --  116*  CO2 24  --   --  25  GLUCOSE 233*  --   --  182*  BUN 21*  --   --  23*  CREATININE 1.23*  --   --  1.21*   < > = values in this interval not displayed.   Recent Labs    10/31/17 0621  TROPONINI 2.57*   Hepatic Function Panel No results for input(s): PROT, ALBUMIN, AST, ALT, ALKPHOS, BILITOT, BILIDIR, IBILI in the last 72 hours. No results for input(s): CHOL in the last 72 hours. No results for input(s): PROTIME in the last 72 hours.  Imaging: Imaging results have been reviewed and Mr Brain Wo Contrast  Result Date: 11/01/2017 CLINICAL DATA:  Cerebellar hemorrhage. EXAM: MRI HEAD WITHOUT CONTRAST TECHNIQUE: Multiplanar, multiecho pulse sequences of the brain and surrounding structures were obtained without  intravenous contrast. COMPARISON:  10/31/2017 FINDINGS: Brain: 2.9 x 1.5 cm hematoma in the right cerebellum has not significantly changed in size and again is noted to extend into the fourth ventricle, and there are trace blood products in the occipital horns of both lateral ventricles. Mild surrounding edema in the right cerebellar hemisphere is likely unchanged. The fourth ventricle remains patent, and there is no evidence of obstructive hydrocephalus. No acute infarct is identified separate from the hematoma, and no underlying mass is evident on this unenhanced study. Patchy to confluent T2 hyperintensities throughout the cerebral white matter nonspecific but compatible with moderate chronic small vessel ischemic disease. There is mild-to-moderate cerebral atrophy. There is no midline shift or extra-axial fluid collection. Vascular: Major intracranial vascular flow voids are preserved. Skull and upper cervical spine: Unremarkable bone marrow signal. Sinuses/Orbits: Bilateral cataract extraction. Clear paranasal sinuses. Small right and moderate left mastoid effusions. Other: None. IMPRESSION: 1. Unchanged right cerebellar hematoma with mild intraventricular extension. No hydrocephalus. 2. No acute infarct separate from the hematoma. 3. Moderate chronic small vessel ischemic disease. Electronically Signed   By: Logan Bores M.D.   On: 11/01/2017 10:30    Cardiac Studies:  Assessment/Plan:  Status post acute anteroseptal wall MI with atypical presentation Multivessel calcific 3 vessel disease Resolving decompensated systolic congestive heart failure Acute right cerebellar bleed with  extension to IVH Hypertension Diabetes mellitus History of polycythemiavera History of TIA in the past Osteoporosis Marked leukocytosis questionable etiology Plan Add Aldactone 25 mg by mouth daily Will switch Lasix to by mouth once off IV 3% normal saline. Will consider PCI  only once able to tolerate dual  antiplatelet medications and stable from neuro point of view as outpatient in 3-4 weeks.  LOS: 4 days    [name] 11/02/2017, 9:41 AM

## 2017-11-02 NOTE — Progress Notes (Addendum)
STROKE TEAM PROGRESS NOTE   SUBJECTIVE (INTERVAL HISTORY) No family at [facility]. Remains stable. MRI shows stable. Wean 3% and transfer to floor when off.    OBJECTIVE Temp:  [97.8 F (36.6 C)-99 F (37.2 C)] 97.9 F (36.6 C) (05/14 0400) Pulse Rate:  [78-97] 81 (05/14 0800) Cardiac Rhythm: Normal sinus rhythm (05/14 0800) Resp:  [18-33] 28 (05/14 0800) BP: (98-149)/(71-100) 143/97 (05/14 0800) SpO2:  [93 %-100 %] 96 % (05/14 0800)  CBC:  Recent Labs  Lab 10/28/17 1504  11/01/17 0522 11/02/17 0530  WBC 26.1*   < > 15.1* 18.4*  NEUTROABS 21.4*  --   --   --   HGB 14.4   < > 10.6* 10.6*  HCT 45.9   < > 36.5 36.3  MCV 86.1   < > 90.3 90.3  PLT 698*   < > 269 249   < > = values in this interval not displayed.    Basic Metabolic Panel:  Recent Labs  Lab 10/30/17 0249  11/01/17 0522  11/01/17 2320 11/02/17 0530  NA 143   < > 151*   < > 158* 149*  K 2.8*   < > 3.5  --   --  3.8  CL 104   < > 115*  --   --  116*  CO2 26   < > 24  --   --  25  GLUCOSE 229*   < > 233*  --   --  182*  BUN 26*   < > 21*  --   --  23*  CREATININE 1.21*   < > 1.23*  --   --  1.21*  CALCIUM 8.3*   < > 7.7*  --   --  7.9*  MG 1.3*   < > 1.6*  --   --  1.5*  PHOS 3.4  --   --   --   --   --    < > = values in this interval not displayed.    Lipid Panel:     Component Value Date/Time   CHOL 148 12/20/2014 1533   TRIG 130 12/20/2014 1533   HDL 44 12/20/2014 1533   CHOLHDL 3.4 12/20/2014 1533   VLDL 26 12/20/2014 1533   LDLCALC 78 12/20/2014 1533   HgbA1c:  Lab Results  Component Value Date   HGBA1C 7.0 (H) 03/29/2015   Urine Drug Screen:     Component Value Date/Time   LABOPIA NONE DETECTED 12/20/2014 1639   COCAINSCRNUR NONE DETECTED 12/20/2014 1639   LABBENZ NONE DETECTED 12/20/2014 1639   AMPHETMU NONE DETECTED 12/20/2014 1639   THCU NONE DETECTED 12/20/2014 1639   LABBARB NONE DETECTED 12/20/2014 1639    Alcohol Level     Component Value Date/Time   ETH <5 12/20/2014 1533     IMAGING Ct Head Wo Contrast 10/29/2017 Stable RIGHT cerebellar hemorrhage, with extension into the IV ventricle without hydrocephalus.   Ct Head Wo Contrast 10/29/2017 RIGHT cerebellar parenchymal hemorrhage approximately 31 x 16 x 19 mm. There is extension of hemorrhage into the fourth ventricle, without hydrocephalus. A hypertensive bleed is most likely, but unrecognized trauma, anticoagulation, vascular malformation, hemorrhagic metastatic neoplasm, are all considerations.   Ct Head Wo Contrast 10/31/2017 Stable RIGHT cerebellar parenchymal hemorrhage, IVH with no hydrocephalus   MRI head 11/01/2017 1. Unchanged right cerebellar hematoma with mild intraventricular extension. No hydrocephalus. 2. No acute infarct separate from the hematoma. 3. Moderate chronic small vessel ischemic disease.  Transthoracic Echocardiogram  10/29/2017 - Left ventricle: The cavity size was severely dilated. Systolic function was severely reduced. The estimated ejection fraction was 25%. Akinesis of the anteroseptal myocardium. Akinesis of the anterior myocardium. - Aortic valve: There was trivial regurgitation. Valve area (VTI): 1.68 cm^2. Valve area (Vmax): 1.9 cm^2. Valve area (Vmean): 1.81 cm^2. - Mitral valve: There was mild regurgitation. Valve area by continuity equation (using LVOT flow): 2.19 cm^2. - Left atrium: The atrium was mildly dilated. - Right ventricle: The cavity size was mildly dilated. - Right atrium: The atrium was mildly dilated. - Pericardium, extracardiac: A trivial pericardial effusion was identified posterior to the heart. Features were not consistent with tamponade physiology. There was a left pleural effusion. - 4 Chamber dilatation with severe LV systolic dysfunction with anteroapical akinesis sugestive ASWMI, and no thrombii present in LV. Fibrocalcified aortic valve without AS.  Dg Chest 1 View 10/28/2017 1. Cardiomegaly with new perihilar opacities, presumably pulmonary  edema, suggesting CHF/volume overload. Pneumonia is considered less likely.  2. Aortic atherosclerosis.   Ct Angio Chest Pe W And/or Wo Contrast 10/28/2017 1. Pericardial effusion and cardiomegaly.  Coronary artery disease.  2. Parenchymal changes consistent with pulmonary edema and small pleural effusions.  3. 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 Recommend annual imaging followup by CTA or MRA. This recommendation follows 2010 ACCF/AHA/AATS/ACR/ASA/SCA/SCAI/SIR/STS/SVM Guidelines for the Diagnosis and Management of Patients with Thoracic Aortic Disease. Circulation.2010; 121: X540-G867  4. Aortic aneurysm NOS (ICD10-I71.9). Aortic Atherosclerosis (ICD10-I70.0).  5. Stable probable scar at the LEFT lung apex. 6. Numerous thoracic compression fractures.   Dg Chest Port 1 View 10/30/2017 Cardiomegaly with findings of CHF and small bilateral pleural effusions. No pneumothorax.   Left Heart Cath - Neoma Laming MD [facility] 10/29/2017  Ost Cx to Prox Cx lesion is 80% stenosed with 90% stenosed side branch in Ost 1st Mrg to 1st Mrg.  Mid LAD lesion is 95% stenosed.  Mid RCA lesion is 75% stenosed.  Ost LAD to Prox LAD lesion is 70% stenosed. Severe 3 vessel disease with severe LV systolic dysfunction and anteroapical aneurysm. Advise CABG at [facility].   PHYSICAL EXAM      General -well developed elderly female in no acute distress Heart - Regular rate and rhythm - no murmer appreciated Lungs - Clear to auscultation anteriorly Extremities - Distal pulses intact - no edema Skin - Warm and dry, R forearm IV infiltrate, normal temperature, small area, mild erythema   Mental Status: awake, alert oriented to self and place and time, follows commands, mild dysarthria. No aphasia. Pupils reactive B/L, EOMI -  Dysmetric saccadic movement to the R, visual field intact, speech dysarthric, face symmetric, motor 5/5 all group,  sensory intact, slow finger to nose B/L   ASSESSMENT/PLAN Ms. ARDENA GANGL is a 84 y.o. female with history of previous stroke, recurrent falls, Polycythemia Vera, Microalbuminuria, HTN, TIA, Endometrial Carcinoma, Diabetes Mellitus, Colon Cancer, Breast Cancer, Arthritis, Adenocarcinoma in Situ of Cervix, severe three-vessel coronary artery disease, ejection fraction 25%, and congestive heart failure presented to [facility] hospital with black emesis and unresponsive 2/2 hypoxia transferred to [facility] with a RIGHT cerebellar parenchymal hemorrhage.    Stroke:  RIGHT cerebellar parenchymal hemorrhage, etiology hypertensive plus dual antiplatelet therapy  CT head - RIGHT cerebellar parenchymal hemorrhage approximately 31 x 16 x 19 mm extension of hemorrhage into the fourth ventricle, stable on a follow up [facility] 5/12 stable ICH/IVH no Hydrocephalus    MRI head - stable  R cerebellar hmg, no hydro, Small vessel disease.   At risk for hydrocephalus, started on hyperosmolar therapy 3% nacl. Na 146 this am. Begin wean of 3% today, cut in half now.   Neurosurgery consult appreciated, no surgical intervention  Repeat CT head in am  Carotid Doppler - not indicated  2D Echo - as above EF 25%  LDL - 78  HgbA1c - 7.0  VTE prophylaxis - SCDs. Changed to lovenox as 2 days out and hmg stable.  clopidogrel 75 mg daily prior to admission  Ongoing aggressive stroke risk factor management  Therapy recommendations:  CIR. Admissions coordinator is following  Disposition:  Pending  Once of 3%, ok to transfer to floor.  Hypertensive Emergency  BP elevated on arrival 174/113  Treated w/ prn labetalol . Increase Systolic blood pressure goal less than 180  . Home meds:  Coreg 6.25 bid, altace 10 mg bid, verapamil 240 CR daily . Now on: Coreg 12.5 bid, altace 10 mg bid . Stable now . Long-term BP goal normotensive  Hyperlipidemia  Lipid lowering medication PTA:  Pravachol 20 mg daily  LDL 78, goal <  70  Current lipid lowering medication: none  Resume Pravachol at discharge. (muscle aches with simvastatin)  Diabetes  HgbA1c 7.0, goal < 7.0  Outpatient Diabetes medications: Metformin 1000 mg bid, Actos 15 mg daily  On  levemir 6u q hs  CBGS - 144 this am  DB RN following.   Other Stroke Risk Factors  Advanced age  Former cigarette smoker - quit  Hx stroke/TIA  Coronary artery disease - Severe 3 vessel CAD - NSTEMI  resolveing decompensated systolic CHF, tx w/ lasix, EF 25%  Leukocytosis  WBC 12.9->18.4 (was 26.1)  Afebrile  Blood cx neg 12/9  Check UA.   Repeat labs in am, check CXR   Other Active Problems/plan   BUN 23 ; creatinine 1.21 stable  Hematemesis - GI consulted at [facility]. On Protonix. 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 On hydroxea. Ok to resume.  Hospital day # [place], MSN, APRN, ANVP-BC, AGPCNP-BC Advanced Practice Stroke Nurse [place] for Schedule & Pager information 11/02/2017 9:03 AM  I have personally examined this patient, reviewed notes, independently viewed imaging studies, participated in medical decision making and plan of care.ROS completed by me personally and pertinent positives fully documented  I have made any additions or clarifications directly to the above note. Agree with note above.  Recommend check CT scan without contrast tomorrow morning. Check WBC count. Taper hypertonic saline drip over the next 24 hours and discontinue. May need to resume aspirin after about a week and Plavix after 3-4 weeks if stable. Discussed with Dr. Her 1 week. This patient is critically ill and at significant risk of neurological worsening, death and care requires constant monitoring of vital signs, hemodynamics,respiratory and cardiac monitoring, extensive  review of multiple databases, frequent neurological assessment, discussion with family, other specialists and medical decision making of high complexity.I have made any additions or clarifications directly to the above note.This critical care time does not reflect procedure time, or teaching time or supervisory time of PA/NP/Med Resident etc but could involve care discussion time.  I spent 30 minutes of neurocritical care time  in the care of  this patient.     Antony Contras,  MD Medical Director [facility] Pager: [phone] 11/02/2017 3:24 PM    To contact Stroke Continuity provider, please refer to [email or web address] After hours, contact General Neurology

## 2017-11-03 ENCOUNTER — Inpatient Hospital Stay (HOSPITAL_COMMUNITY): Payer: Medicare Other

## 2017-11-03 ENCOUNTER — Other Ambulatory Visit: Payer: Self-pay

## 2017-11-03 LAB — GLUCOSE, CAPILLARY
Glucose-Capillary: 144 mg/dL — ABNORMAL HIGH (ref 65–99)
Glucose-Capillary: 166 mg/dL — ABNORMAL HIGH (ref 65–99)
Glucose-Capillary: 180 mg/dL — ABNORMAL HIGH (ref 65–99)
Glucose-Capillary: 149 mg/dL — ABNORMAL HIGH (ref 65–99)

## 2017-11-03 LAB — CBC
HCT: 34.4 % — ABNORMAL LOW (ref 36.0–46.0)
Hemoglobin: 10.1 g/dL — ABNORMAL LOW (ref 12.0–15.0)
MCH: 26.2 pg (ref 26.0–34.0)
MCHC: 29.4 g/dL — ABNORMAL LOW (ref 30.0–36.0)
MCV: 89.4 fL (ref 78.0–100.0)
Platelets: 229 10*3/uL (ref 150–400)
RBC: 3.85 MIL/uL — ABNORMAL LOW (ref 3.87–5.11)
RDW: 18.6 % — ABNORMAL HIGH (ref 11.5–15.5)
WBC: 15.7 10*3/uL — ABNORMAL HIGH (ref 4.0–10.5)

## 2017-11-03 LAB — BASIC METABOLIC PANEL
Anion gap: 10 (ref 5–15)
BUN: 27 mg/dL — ABNORMAL HIGH (ref 6–20)
CO2: 24 mmol/L (ref 22–32)
Calcium: 8.1 mg/dL — ABNORMAL LOW (ref 8.9–10.3)
Chloride: 111 mmol/L (ref 101–111)
Creatinine, Ser: 1.2 mg/dL — ABNORMAL HIGH (ref 0.44–1.00)
GFR calc Af Amer: 47 mL/min — ABNORMAL LOW (ref >60)
GFR calc non Af Amer: 41 mL/min — ABNORMAL LOW (ref >60)
Glucose, Bld: 155 mg/dL — ABNORMAL HIGH (ref 65–99)
Potassium: 3.3 mmol/L — ABNORMAL LOW (ref 3.5–5.1)
Sodium: 145 mmol/L (ref 135–145)

## 2017-11-03 LAB — SODIUM: Sodium: 144 mmol/L (ref 135–145)

## 2017-11-03 LAB — MAGNESIUM: Magnesium: 1.5 mg/dL — ABNORMAL LOW (ref 1.7–2.4)

## 2017-11-03 MED ORDER — GLUCERNA SHAKE PO LIQD
237.0000 mL | Freq: Three times a day (TID) | ORAL | Status: DC
  Administered 2017-11-03: 237 mL via ORAL

## 2017-11-03 MED ORDER — POTASSIUM CHLORIDE CRYS ER 20 MEQ PO TBCR
20.0000 meq | EXTENDED_RELEASE_TABLET | Freq: Two times a day (BID) | ORAL | Status: DC
  Administered 2017-11-03 – 2017-11-04 (×2): 20 meq via ORAL
  Filled 2017-11-03 (×2): qty 1

## 2017-11-03 MED ORDER — MAGNESIUM SULFATE 4 GM/100ML IV SOLN
4.0000 g | Freq: Once | INTRAVENOUS | Status: AC
  Administered 2017-11-04: 4 g via INTRAVENOUS
  Filled 2017-11-03: qty 100

## 2017-11-03 MED ORDER — ENSURE ENLIVE PO LIQD
237.0000 mL | Freq: Three times a day (TID) | ORAL | Status: DC
  Administered 2017-11-03: 237 mL via ORAL

## 2017-11-03 NOTE — Progress Notes (Signed)
  Speech Language Pathology Treatment: Cognitive-Linquistic  Patient Details Name: [name] MRN: [id number] DOB: 08/02/1931 Today's Date: 11/03/2017 Time: 1324-4010 SLP Time Calculation (min) (ACUTE ONLY): 15 min  Assessment / Plan / Recommendation Clinical Impression  Pt was seen for skilled ST targeting cognitive-linguistic goals.  Pt was oriented to place and situation and could identify at least 2 deficits post CVA with question cues.  Pt reports that her speech was "louder" prior to admission; however, when therapist provided skilled education regarding techniques to maximize speech intelligibility (increased vocal intensity, coordinating respiration with onset of phonation) pt reports "Well, I'm doing better" and "It will be fine."  Pt appeared resistant to therapist's interventions, but would increase vocal intensity during conversations with mod assist verbal and gestural cues to improve intelligibility.  Pt was left in recliner with call bell within reach and family member at bedside.  Given decreased endurance and motivation to participate in therapy, question whether pt will be able to tolerate CIR.    HPI HPI: 84 year old female with PMhx: HTN, DM, TIA, DJD, breast and colon CA. Admitted to [place] with acute onset of confusion and lethargy, generalized weakness and shortness of breath. Pt s/p left heart cath with 3 vessel CAD, CHF and acute MI. CT which showed right cerebellar hemorrhage with IVH.                         Follow up Recommendations: Skilled Nursing facility SLP Visit Diagnosis: Dysarthria and anarthria (R47.1);Cognitive communication deficit (R41.841)       GO                Deshane Cotroneo, Selinda Orion 11/03/2017, 12:03 PM

## 2017-11-03 NOTE — Progress Notes (Addendum)
STROKE TEAM PROGRESS NOTE   SUBJECTIVE (INTERVAL HISTORY) Her niece is at the bedside. CT stable. Off 3%. Will xfer to floor. Looking at [facility] for therapy. Plans OP cath in the future per cardiology.    OBJECTIVE Temp:  [98.3 F (36.8 C)] 98.3 F (36.8 C) (05/15 0400) Pulse Rate:  [71-88] 73 (05/15 0800) Cardiac Rhythm: Normal sinus rhythm (05/15 0400) Resp:  [19-36] 22 (05/15 0800) BP: (112-148)/(68-96) 141/89 (05/15 0800) SpO2:  [93 %-98 %] 96 % (05/15 0800)  CBC:  Recent Labs  Lab 10/28/17 1504  11/02/17 0530 11/03/17 0440  WBC 26.1*   < > 18.4* 15.7*  NEUTROABS 21.4*  --   --   --   HGB 14.4   < > 10.6* 10.1*  HCT 45.9   < > 36.3 34.4*  MCV 86.1   < > 90.3 89.4  PLT 698*   < > 249 229   < > = values in this interval not displayed.    Basic Metabolic Panel:  Recent Labs  Lab 10/30/17 0249  11/01/17 0522  11/02/17 0530  11/02/17 2310 11/03/17 0440  NA 143   < > 151*   < > 149*   < > 144 145  K 2.8*   < > 3.5  --  3.8  --   --  3.3*  CL 104   < > 115*  --  116*  --   --  111  CO2 26   < > 24  --  25  --   --  24  GLUCOSE 229*   < > 233*  --  182*  --   --  155*  BUN 26*   < > 21*  --  23*  --   --  27*  CREATININE 1.21*   < > 1.23*  --  1.21*  --   --  1.20*  CALCIUM 8.3*   < > 7.7*  --  7.9*  --   --  8.1*  MG 1.3*   < > 1.6*  --  1.5*  --   --   --   PHOS 3.4  --   --   --   --   --   --   --    < > = values in this interval not displayed.    Lipid Panel:     Component Value Date/Time   CHOL 148 12/20/2014 1533   TRIG 130 12/20/2014 1533   HDL 44 12/20/2014 1533   CHOLHDL 3.4 12/20/2014 1533   VLDL 26 12/20/2014 1533   LDLCALC 78 12/20/2014 1533   HgbA1c:  Lab Results  Component Value Date   HGBA1C 7.0 (H) 03/29/2015   Urine Drug Screen:     Component Value Date/Time   LABOPIA NONE DETECTED 12/20/2014 1639   COCAINSCRNUR NONE DETECTED 12/20/2014 1639   LABBENZ NONE DETECTED 12/20/2014 1639   AMPHETMU NONE DETECTED 12/20/2014 1639   THCU NONE  DETECTED 12/20/2014 1639   LABBARB NONE DETECTED 12/20/2014 1639    Alcohol Level     Component Value Date/Time   ETH <5 12/20/2014 1533    IMAGING Ct Head Wo Contrast 10/29/2017 Stable RIGHT cerebellar hemorrhage, with extension into the IV ventricle without hydrocephalus.   Ct Head Wo Contrast 10/29/2017 RIGHT cerebellar parenchymal hemorrhage approximately 31 x 16 x 19 mm. There is extension of hemorrhage into the fourth ventricle, without hydrocephalus. A hypertensive bleed is most likely, but unrecognized trauma, anticoagulation, vascular malformation, hemorrhagic  metastatic neoplasm, are all considerations.   Ct Head Wo Contrast 10/31/2017 Stable RIGHT cerebellar parenchymal hemorrhage, IVH with no hydrocephalus   Ct Head Wo Contrast 11/03/2017 1. Stable small hemorrhage within the right cerebellar hemisphere. Stable mild associated edema and local mass effect. 2. No new acute intracranial abnormality. 3. Stable chronic microvascular ischemic changes and parenchymal volume loss of the brain.  MRI head 11/01/2017 1. Unchanged right cerebellar hematoma with mild intraventricular extension. No hydrocephalus. 2. No acute infarct separate from the hematoma. 3. Moderate chronic small vessel ischemic disease.  Transthoracic Echocardiogram  10/29/2017 - Left ventricle: The cavity size was severely dilated. Systolic function was severely reduced. The estimated ejection fraction was 25%. Akinesis of the anteroseptal myocardium. Akinesis of the anterior myocardium. - Aortic valve: There was trivial regurgitation. Valve area (VTI): 1.68 cm^2. Valve area (Vmax): 1.9 cm^2. Valve area (Vmean): 1.81 cm^2. - Mitral valve: There was mild regurgitation. Valve area by continuity equation (using LVOT flow): 2.19 cm^2. - Left atrium: The atrium was mildly dilated. - Right ventricle: The cavity size was mildly dilated. - Right atrium: The atrium was mildly dilated. - Pericardium, extracardiac: A  trivial pericardial effusion was identified posterior to the heart. Features were not consistent with tamponade physiology. There was a left pleural effusion. - 4 Chamber dilatation with severe LV systolic dysfunction with anteroapical akinesis sugestive ASWMI, and no thrombii present in LV. Fibrocalcified aortic valve without AS.  Dg Chest 1 View 10/28/2017 1. Cardiomegaly with new perihilar opacities, presumably pulmonary edema, suggesting CHF/volume overload. Pneumonia is considered less likely.  2. Aortic atherosclerosis.   Ct Angio Chest Pe W And/or Wo Contrast 10/28/2017 1. Pericardial effusion and cardiomegaly.  Coronary artery disease.  2. Parenchymal changes consistent with pulmonary edema and small pleural effusions.  3. 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 Recommend annual imaging followup by CTA or MRA. This recommendation follows 2010 ACCF/AHA/AATS/ACR/ASA/SCA/SCAI/SIR/STS/SVM Guidelines for the Diagnosis and Management of Patients with Thoracic Aortic Disease. Circulation.2010; 121: A416-S063  4. Aortic aneurysm NOS (ICD10-I71.9). Aortic Atherosclerosis (ICD10-I70.0).  5. Stable probable scar at the LEFT lung apex. 6. Numerous thoracic compression fractures.   Dg Chest Port 1 View 10/30/2017 Cardiomegaly with findings of CHF and small bilateral pleural effusions. No pneumothorax.   Left Heart Cath - Neoma Laming MD [facility] 10/29/2017  Ost Cx to Prox Cx lesion is 80% stenosed with 90% stenosed side branch in Ost 1st Mrg to 1st Mrg.  Mid LAD lesion is 95% stenosed.  Mid RCA lesion is 75% stenosed.  Ost LAD to Prox LAD lesion is 70% stenosed. Severe 3 vessel disease with severe LV systolic dysfunction and anteroapical aneurysm. Advise CABG at [facility].   PHYSICAL EXAM per Madison Hickman -well developed elderly female in no acute distress Heart - Regular rate and rhythm - no murmer appreciated Lungs -  Clear to auscultation anteriorly Extremities - Distal pulses intact - no edema Skin - Warm and dry, R forearm IV infiltrate, normal temperature, small area, mild erythema   Mental Status: awake, alert oriented to self and place and time, follows commands, mild dysarthria. No aphasia. Pupils reactive B/L, EOMI -  Dysmetric saccadic movement to the R, visual field intact, speech minimally dysarthric, face symmetric, motor 5/5 all group, sensory intact, slow finger to nose B/L   ASSESSMENT/PLAN Ms. CHONDRA BOYDE is a 84 y.o. female with history of previous stroke, recurrent falls, Polycythemia Vera, Microalbuminuria, HTN, TIA,  Endometrial Carcinoma, Diabetes Mellitus, Colon Cancer, Breast Cancer, Arthritis, Adenocarcinoma in Situ of Cervix, severe three-vessel coronary artery disease, ejection fraction 25%, and congestive heart failure presented to [facility] hospital with black emesis and unresponsive 2/2 hypoxia transferred to [facility] with a RIGHT cerebellar parenchymal hemorrhage.    Stroke:  RIGHT cerebellar parenchymal hemorrhage, etiology hypertensive plus dual antiplatelet therapy  CT head - RIGHT cerebellar parenchymal hemorrhage approximately 31 x 16 x 19 mm extension of hemorrhage into the fourth ventricle, stable on a follow up [facility] 5/12 stable ICH/IVH no Hydrocephalus    MRI head - stable R cerebellar hmg, no hydro, Small vessel disease.   At risk for hydrocephalus, started on hyperosmolar therapy 3% nacl. Na 145 this am. off 3% last night at 9p.   Neurosurgery consult appreciated, no surgical intervention  Repeat CT head 5/15 stable  Carotid Doppler - not indicated  2D Echo - as above EF 25%  LDL - 78  HgbA1c - 7.0  VTE prophylaxis - SCDs. Changed to lovenox as 2 days out and hmg stable.  clopidogrel 75 mg daily prior to admission. Plan to resume aspirin 81 at 1 week and plavix 75 in 3 weeks (stopping aspirin at that time)  Ongoing aggressive stroke risk factor management  Therapy  recommendations:  CIR. Admissions coordinator is following  Disposition:  Pending  D/c PICC  transfer to floor  Hypertensive Emergency  BP elevated on arrival 174/113  Treated w/ prn labetalol . Increase Systolic blood pressure goal less than 180  . Home meds:  Coreg 6.25 bid, altace 10 mg bid, verapamil 240 CR daily . Now on: Coreg 12.5 bid, altace 10 mg bid . Stable now . Long-term BP goal normotensive  Hyperlipidemia  Lipid lowering medication PTA:  Pravachol 20 mg daily  LDL 78, goal < 70  Current lipid lowering medication: none  Resume Pravachol at discharge. (muscle aches with simvastatin)  Diabetes  HgbA1c 7.0, goal < 7.0  Outpatient Diabetes medications: Metformin 1000 mg bid, Actos 15 mg daily  On  levemir 6u q hs  CBGS - 144 this am  DB RN following.   Other Stroke Risk Factors  Advanced age  Former cigarette smoker - quit  Hx stroke/TIA  Decompensated systolic CHF  tx w/ lasix  EF 25%  Wean O2 - sat 94% on 1 L. Decrease O2 goal to 92%  CXR  - Worsening of pulmonary interstitial edema secondary to CHF. Possible left lower lobe atelectasis or pneumonia. Thoracic aortic atherosclerosis.  Have RN notify Dr. Terrence Dupont to see if any change in lasix treatment  Coronary artery disease - Severe 3 vessel CAD - NSTEMI  PCI  only once able to tolerate dual antiplatelet medications and stable from neuro point of view as outpatient in 3-4 weeks. [facility])  Plan resume aspirin in 1 week and plavix 75 mg in 3 weeks  Leukocytosis  WBC 12.9->18.4 -> 15.7 (was 26.1)  Afebrile  Blood cx neg 12/9  UA normal  CXR  - Worsening of pulmonary interstitial edema secondary to CHF. Possible left lower lobe atelectasis or pneumonia. Thoracic aortic atherosclerosis.    Other Active Problems/plan   BUN 27 ; creatinine 1.20 stable  Hematemesis - GI consulted at [facility]. On Protonix. 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 On hydroxea. Ok to resume.  Hypokalemia 3.3   Hospital  day # 5   Burnetta Sabin, MSN, APRN, ANVP-BC, AGPCNP-BC Advanced Practice Stroke Nurse [place] for Schedule & Pager information 11/03/2017 8:34 AM  I have personally examined this patient, reviewed notes, independently viewed imaging studies, participated in medical decision making and plan of care.ROS completed by me personally and pertinent positives fully documented  I have made any additions or clarifications directly to the above note. Agree with note above.  Plan taper and discontinue oxygen. Mobilize out of bed. Therapy consults. Transfer to floor bed. Long discussion with patient and niece at the bedside and answered questions. This patient is critically ill and at significant risk of neurological worsening, death and care requires constant monitoring of vital signs, hemodynamics,respiratory and cardiac monitoring, extensive review of multiple databases, frequent neurological assessment, discussion with family, other specialists and medical decision making of high complexity.I have made any additions or clarifications directly to the above note.This critical care time does not reflect procedure time, or teaching time or supervisory time of PA/NP/Med Resident etc but could involve care discussion time.  I spent 30 minutes of neurocritical care time  in the care of  this patient.      Antony Contras, MD Medical Director [facility] Pager: [phone] 11/03/2017 1:05 PM    To contact Stroke Continuity provider, please refer to [email or web address] After hours, contact General Neurology

## 2017-11-03 NOTE — Progress Notes (Signed)
Subjective:  Patient denies any chest pain or shortness of breath.  Objective:  Vital Signs in the last 24 hours: Temp:  [97.6 F (36.4 C)-98.3 F (36.8 C)] 97.6 F (36.4 C) (05/15 1200) Pulse Rate:  [71-88] 81 (05/15 1200) Resp:  [19-36] 27 (05/15 1200) BP: (112-148)/(68-96) 134/91 (05/15 1200) SpO2:  [93 %-98 %] 94 % (05/15 1200)  Intake/Output from previous day: 05/14 0701 - 05/15 0700 In: 1941.3 [P.O.:1460; I.V.:481.3] Out: 1150 [Urine:1150] Intake/Output from this shift: Total I/O In: 300 [P.O.:300] Out: -   Physical Exam: Neck: no adenopathy, no carotid bruit, no JVD and supple, symmetrical, trachea midline Lungs: decreased breath sounds in bases with bilateral rhonchi and rales, left more than right Heart: regular rate and rhythm, S1, S2 normal and soft systolic murmur noted Abdomen: soft, non-tender; bowel sounds normal; no masses,  no organomegaly Extremities: extremities normal, atraumatic, no cyanosis or edema  Lab Results: Recent Labs    11/02/17 0530 11/03/17 0440  WBC 18.4* 15.7*  HGB 10.6* 10.1*  PLT 249 229   Recent Labs    11/02/17 0530  11/02/17 2310 11/03/17 0440  NA 149*   < > 144 145  K 3.8  --   --  3.3*  CL 116*  --   --  111  CO2 25  --   --  24  GLUCOSE 182*  --   --  155*  BUN 23*  --   --  27*  CREATININE 1.21*  --   --  1.20*   < > = values in this interval not displayed.   No results for input(s): TROPONINI in the last 72 hours.  Invalid input(s): CK, MB Hepatic Function Panel No results for input(s): PROT, ALBUMIN, AST, ALT, ALKPHOS, BILITOT, BILIDIR, IBILI in the last 72 hours. No results for input(s): CHOL in the last 72 hours. No results for input(s): PROTIME in the last 72 hours.  Imaging: Imaging results have been reviewed and Ct Head Wo Contrast  Result Date: 11/03/2017 CLINICAL DATA:  84 y/o  F; intracranial hemorrhage for follow-up. EXAM: CT HEAD WITHOUT CONTRAST TECHNIQUE: Contiguous axial images were obtained from  the base of the skull through the vertex without intravenous contrast. COMPARISON:  10/29/2017 and 10/31/2017 CT head. 11/01/2017 MRI head. FINDINGS: Brain: Stable acute hemorrhage within the right cerebellum measuring up to 27 mm as well as associated edema and mass effect. No evidence for interval acute stroke, hemorrhage, or focal mass effect of the brain. Stable chronic microvascular ischemic changes and parenchymal volume loss. Stable small chronic lacunar infarcts within the bilateral basal ganglia. Vascular: Mild calcific atherosclerosis of carotid siphons. No hyperdense vessel identified. Skull: Normal. Negative for fracture or focal lesion. Sinuses/Orbits: Partial opacification of right-sided ethmoid air cells. Opacification of left mastoid tip. Bilateral intra-ocular lens replacement. Other: None. IMPRESSION: 1. Stable small hemorrhage within the right cerebellar hemisphere. Stable mild associated edema and local mass effect. 2. No new acute intracranial abnormality. 3. Stable chronic microvascular ischemic changes and parenchymal volume loss of the brain. Electronically Signed   By: Kristine Garbe M.D.   On: 11/03/2017 01:26   Dg Chest Port 1 View  Result Date: 11/03/2017 CLINICAL DATA:  Elevated white blood cell count, pulmonary edema and respiratory failure, intraventricular hemorrhage. EXAM: PORTABLE CHEST 1 VIEW COMPARISON:  Portable chest x-ray of Oct 31, 2017 FINDINGS: The lungs are well-expanded. The interstitial markings are increased and more conspicuous today. The pulmonary vascularity is engorged and more prominent. The cardiac silhouette  remains enlarged. There is calcification in the wall of the thoracic aorta. There is a small left pleural effusion. There is chronic compression of a midthoracic vertebral body. The right-sided PICC line tip projects over the midportion of the SVC. IMPRESSION: Worsening of pulmonary interstitial edema secondary to CHF. Possible left lower lobe  atelectasis or pneumonia. Thoracic aortic atherosclerosis. Electronically Signed   By: David  Martinique M.D.   On: 11/03/2017 09:49    Cardiac Studies:  Assessment/Plan:  Status post acute anteroseptal wall MI with atypical presentation Multivessel calcific 3 vessel disease Resolving decompensated systolic congestive heart failure Acute right cerebellar bleed with extension to IVH Hypertension Diabetes mellitus History of polycythemiavera History of TIA in the past Osteoporosis Marked leukocytosis improved. Left lower lobe atelectasis versus pneumonia hypokalemia Plan Continue present management and IV Lasix one more day. Strict I and O's. Daily weights. Replace K   LOS: 5 days    Charolette Forward 11/03/2017, 1:03 PM

## 2017-11-03 NOTE — PMR Pre-admission (Signed)
PMR Admission Coordinator Pre-Admission Assessment  Patient: [name] is an 84 y.o., female MRN: [id number] DOB: 1931/10/22 Height:   Weight: 56.7 kg (125 lb)              Insurance Information HMO:     PPO: X     PCP:      IPA:      80/20:      OTHER:  PRIMARY: UHC Medicare      Policy#: [id number]      Subscriber: Self CM Name: Vevelyn Royals       Phone#: [phone]     Fax#: [phone] Pre-Cert#: [id number] for 7 days 11/04/17-11/10/17 with faxed updates due to Vevelyn Royals      Employer: Retired  Benefits:  Phone #: [phone]     Name: Verified online at [facility] online portal  Eff. Date: 07/23/17     Deduct: $0      Out of Pocket Max: $3300      Life Max: N/A CIR: $105 a day, days 1-10; $0 a day, days 11+      SNF: $0 a day, days 1-20; $50 a day, days 21-100 Outpatient: Necessity     Co-Pay: $20 per visit  Home Health: Necessity, 100%       Co-Pay: $0 DME: 80%     Co-Pay: 20% Providers: In-network   SECONDARY: None       Per patient and niece report they have a long-term care policy   Emergency Contact Information Contact Information    Name Relation Home Work Mobile   [place] Niece [phone]  [phone]   Pablo Ledger [phone]  [phone]   [place],Lottie Friend [phone]  [phone]   [place], [place] Niece   [phone]     Current Medical History  Patient Admitting Diagnosis: Right cerebellar hemorrhage with cardiac debility  History of Present Illness: Kiari Hosmer. 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  Per chart review and niece patient lives alone.  One level home 2 steps to entry.  Reportedly independent prior to admission and driving.  Presented to [facility] 10/28/2017 with altered mental status, hematemesis and lethargy as well as difficulty in breathing.   Oxygen saturations in the 70s requiring nonrebreather mask.  Blood pressure 174/113, WBC 26,000, troponin 1.02, lactic acid 3.1.  CT angiogram of the chest showed pericardial effusion and cardiomegaly consistent with pulmonary edema.  CT of the head reviewed showing right cerebellar hemorrhage.  Per report, right cerebellar parenchymal hemorrhage measuring 31 x 16 x 19 mm.  There was extension of hemorrhage into the fourth ventricle without hydrocephalus.  Patient initially had been placed on 3% saline.  Patient was ruled in for non-Q wave myocardial infarction underwent cardiac catheterization showing three-vessel disease with severe LV dysfunction.  CVTS consulted for possible need for CABG was not felt to be a good candidate due to multiple comorbidities.  She was transferred to [facility] for further evaluation.  Echocardiogram with ejection fraction 25% as well as akinesis of the anterior myocardium.  Neurosurgery Dr. Sherley Bounds follow-up for cerebellar hemorrhage advise conservative care with follow-up cranial CT scan 10/31/2017 as well as MRI 11/01/2017 stable.  Subcutaneous Lovenox was later initiated for DVT prophylaxis.  Placed on intravenous Lasix for diuresis per cardiology services.  Plan was to consider PCI only once able to tolerate dual antiplatelet medications.  Patient was cleared to begin aspirin 81 mg daily 11/05/2017.  Physical and occupational therapy evaluations completed with recommendations of physical medicine rehab consult.  Patient was admitted for a comprehensive rehab program 11/04/17.  NIH Total: 1    Past Medical History  Past Medical History:  Diagnosis Date  . Adenocarcinoma in situ of cervix   . Arthritis    hands  . Breast cancer ([place])   . Colon cancer ([place])   . Diabetes mellitus without complication ([place])   . Endometrial carcinoma ([facility])    s/p total abdominal hysterectomy  . H/O compression fracture of spine 2014   thoracic spine  . H/O polycythemia vera    . H/O TIA (transient ischemic attack) and stroke 09/2014, 03/2015   No deficits  . Hypertension   . Hyperuricemia   . Microalbuminuria   . Polycythemia vera ([place])   . Recurrent falls   . Skin cancer    face, legs  . Stroke [facility]) 2008   no deficits  . Trochanteric bursitis   . Varicose veins    treated    Family History  family history includes Breast cancer in her mother; Cancer in her brother; Diabetes in her sister; Heart attack in her father, mother, and sister.  Prior Rehab/Hospitalizations:  Has the patient had major surgery during 100 days prior to admission? No  Current Medications   Current Facility-Administered Medications:  .  carvedilol (COREG) tablet 12.5 mg, 12.5 mg, Oral, BID WC, Harwani, Mohan, MD, 12.5 mg at 11/04/17 0802 .  enoxaparin (LOVENOX) injection 30 mg, 30 mg, Subcutaneous, Q24H, Biby, Sharon L, NP, 30 mg at 11/04/17 0802 .  feeding supplement (GLUCERNA SHAKE) (GLUCERNA SHAKE) liquid 237 mL, 237 mL, Oral, TID BM, Garvin Fila, MD, 237 mL at 11/03/17 2238 .  [START ON 11/05/2017] furosemide (LASIX) tablet 40 mg, 40 mg, Oral, Daily, Harwani, Mohan, MD .  hydroxyurea (HYDREA) capsule 500 mg, 500 mg, Oral, Once per day on Sun Mon Tue Wed Thu Fri, Biby, Massie Kluver, NP, 500 mg at 11/04/17 0814 .  insulin aspart (novoLOG) injection 0-9 Units, 0-9 Units, Subcutaneous, TID WC, Aroor, Lanice Schwab, MD, 2 Units at 11/04/17 0802 .  insulin detemir (LEVEMIR) injection 6 Units, 6 Units, Subcutaneous, QHS, Biby, Sharon L, NP, 6 Units at 11/03/17 2238 .  MEDLINE mouth rinse, 15 mL, Mouth Rinse, BID, Aroor, Karena Addison R, MD, 15 mL at 11/04/17 0926 .  nitroGLYCERIN (NITRODUR - Dosed in mg/24 hr) patch 0.2 mg, 0.2 mg, Transdermal, Daily, Harwani, Mohan, MD .  pantoprazole (PROTONIX) EC tablet 40 mg, 40 mg, Oral, Daily, Charolette Forward, MD, 40 mg at 11/04/17 0925 .  [START ON 11/05/2017] potassium chloride SA (K-DUR,KLOR-CON) CR tablet 20 mEq, 20 mEq, Oral, Daily, Harwani, Mohan,  MD .  ramipril (ALTACE) capsule 10 mg, 10 mg, Oral, BID, Charolette Forward, MD, 10 mg at 11/04/17 0925 .  senna-docusate (Senokot-S) tablet 1 tablet, 1 tablet, Oral, BID, Aroor, Lanice Schwab, MD, 1 tablet at 11/04/17 0926 .  spironolactone (ALDACTONE) tablet 25 mg, 25 mg, Oral, Daily, Charolette Forward, MD, 25 mg at 11/04/17 4818  Patients Current Diet:  Diet Order           DIET DYS 3 Room service appropriate? Yes; Fluid consistency: Thin  Diet effective now          Precautions / Restrictions Precautions Precautions: Fall Precaution Comments: oxygen tank for ambulation Restrictions Weight Bearing Restrictions: No   Has the patient had 2 or more falls  or a fall with injury in the past year?No  Prior Activity Level Community (5-7x/wk): Prior to admission patient lived alone, was active, fully independent, and driving.  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     Home Assistive Devices / Equipment Home Equipment: Cane - single point, Environmental consultant - 2 wheels  Prior Device Use: Indicate devices/aids used by the patient prior to current illness, exacerbation or injury? None of the above  Prior Functional Level Prior Function Level of Independence: Independent Comments: ADLs, IADLs, and driving. 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  Self Care: Did the patient need help bathing, dressing, using the toilet or eating? Independent  Indoor Mobility: Did the patient need assistance with walking from room to room (with or without device)? Independent  Stairs: Did the patient need assistance with internal or external stairs (with or without device)? Independent  Functional Cognition: Did the patient need help planning regular tasks such as shopping or remembering to take medications? Independent  Current Functional Level Cognition  Arousal/Alertness: Awake/alert Overall  Cognitive Status: Impaired/Different from baseline Current Attention Level: Selective Orientation Level: Oriented to person, Oriented to place, Oriented to situation, Disoriented to time Following Commands: Follows one step commands with increased time, Follows multi-step commands inconsistently Safety/Judgement: Decreased awareness of deficits General Comments: pt requires set by set processing to complete oral care. pt stops and states "what do you want me to do now" Attention: Focused, Sustained, Selective, Alternating Focused Attention: Appears intact Sustained Attention: Impaired Sustained Attention Impairment: Verbal basic, Functional basic Selective Attention: Impaired Selective Attention Impairment: Verbal basic, Functional basic Alternating Attention: Impaired Alternating Attention Impairment: Verbal basic Memory: Impaired Memory Impairment: Storage deficit, Decreased recall of new information(Verbal recall 10/15pt) Awareness: Impaired Awareness Impairment: Emergent impairment(pt felt testing did not go as well as she expected) Problem Solving: Impaired Problem Solving Impairment: Verbal basic(sustained attention impacting) Executive Function: Reasoning, Initiating Reasoning: Impaired Reasoning Impairment: Verbal basic, Functional basic Initiating: Impaired Initiating Impairment: Functional basic Behaviors: Perseveration, Other (comment)(avoidant)    Extremity Assessment (includes Sensation/Coordination)  Upper Extremity Assessment: Defer to OT evaluation RUE Deficits / Details: Pt reporting right sided weakness and decreased grasp in right hand. During testing, strength equal between left and right. Will further assess RUE Coordination: decreased gross motor, decreased fine motor  Lower Extremity Assessment: Generalized weakness    ADLs  Overall ADL's : Needs assistance/impaired Eating/Feeding: Set up Eating/Feeding Details (indicate cue type and reason): niece often  holding cups for patient and placing to mouth Grooming: Wash/dry face, Oral care, Standing Grooming Details (indicate cue type and reason): mod cues; min (A) standing to min guard (A) lateral support L UE Upper Body Bathing: Minimal assistance, Sitting Lower Body Bathing: Maximal assistance, Sit to/from stand Upper Body Dressing : Minimal assistance, Sitting Lower Body Dressing: Maximal assistance Lower Body Dressing Details (indicate cue type and reason): Max A to don socks at EOB. Pt able to don left sock with assistance to start task and then maintain left ankle on right knee. Toilet Transfer: Moderate assistance, BSC Toilet Transfer Details (indicate cue type and reason): requires cues for bil UE on BSC. pt needs cues to anterior weight shift. pt holding RW and pushing backward Toileting- Clothing Manipulation and Hygiene: Maximal assistance, Sit to/from stand, +2 for physical assistance Toileting - Clothing Manipulation Details (indicate cue type and reason): Pt able to maintain standing balance with  Min A for right lateral lean while second person performs toilet hygiene. Pt too fatigued to perform self peri care Functional mobility during ADLs: +2 for safety/equipment, +2 for physical assistance, Moderate assistance, Rolling walker General ADL Comments: Pt requires cues for hand placement and to turn RW. pt needs physical (A) to advance RW at times due to fatigue. pt with stable VSS throughout session. Pt with HR 89-94. pt with RR 28-44 with decr to 84% RA with transfer. pt static standing at sink 89-93% RA    Mobility  Overal bed mobility: Needs Assistance Bed Mobility: Supine to Sit Supine to sit: Min assist General bed mobility comments: exiting the bed on L side with (A) to elevate trunk off bed surface. pt reports dizziness initially    Transfers  Overall transfer level: Needs assistance Equipment used: Rolling walker (2 wheeled) Transfers: Sit to/from Stand Sit to Stand: Mod  assist General transfer comment: pt needs (A) To power up and surface elevated to help with power up    Ambulation / Gait / Stairs / Wheelchair Mobility  Ambulation/Gait Ambulation/Gait assistance: Museum/gallery curator (Feet): 80 Feet Assistive device: Rolling walker (2 wheeled) Gait Pattern/deviations: Step-through pattern, Decreased stride length, Trunk flexed General Gait Details: cues for postural check, positioning in the RW, needed occasional stability assist and control of the RW. Gait velocity: slower Gait velocity interpretation: <1.31 ft/sec, indicative of household ambulator    Posture / Balance Dynamic Sitting Balance Sitting balance - Comments: able to sit without UE support EOB Balance Overall balance assessment: Needs assistance Sitting-balance support: Bilateral upper extremity supported, Feet supported Sitting balance-Leahy Scale: Fair Sitting balance - Comments: able to sit without UE support EOB Standing balance support: Bilateral upper extremity supported, During functional activity Standing balance-Leahy Scale: Poor Standing balance comment: bil ue required in standing    Special needs/care consideration BiPAP/CPAP: No CPM: No Continuous Drip IV: No Dialysis: No         Life Vest: No Oxygen: None PTA, now requiring 1L nasal cannula  Special Bed: No Trach Size: No Wound Vac (area): No       Skin: Ecchymosis                               Location: bilateral hands  Bowel mgmt: Continent, last BM 11/03/17 Bladder mgmt: Intermittent incontinence  Diabetic mgmt: Yes with oral medication prior to admission; HgbA1c: 7.0     Previous Home Environment Living Arrangements: Alone  Lives With: Alone Available Help at Discharge: Family, Available PRN/intermittently Type of Home: House Home Layout: One level Home Access: Stairs to enter Entrance Stairs-Rails: Left Entrance Stairs-Number of Steps: 2 Bathroom Shower/Tub: Multimedia programmer:  Handicapped height Home Care Services: No  Discharge Living Setting Plans for Discharge Living Setting: Patient's home, Alone, Other (Comment)(Niece aware of Supervision recommendation) Type of Home at Discharge: House Discharge Home Layout: One level Discharge Home Access: Stairs to enter Entrance Stairs-Rails: Left Entrance Stairs-Number of Steps: 2 Discharge Bathroom Shower/Tub: Walk-in shower, Other (comment)(with threshold ) Discharge Bathroom Toilet: (bedroom: standard; hall: elevated ) Discharge Bathroom Accessibility: (part way to sink and cammode, but to to shower ) Does the patient have any problems obtaining your medications?: No  Social/Family/Support Systems Patient Roles: Other (Comment)(Aunt and friend ) Contact Information: Niece: [name] [phone] Anticipated Caregiver: Niece to arrange in home care, patient has a long term care policy  Anticipated Caregiver's Contact Information: Niece to  arrange hired assist, International aid/development worker through an agency  Ability/Limitations of Caregiver: Niece works  Careers adviser: Intermittent Discharge Plan Discussed with Primary Caregiver: Yes Is Caregiver In Agreement with Plan?: Yes Does Caregiver/Family have Issues with Lodging/Transportation while Pt is in Rehab?: No  Goals/Additional Needs Patient/Family Goal for Rehab: PT/OT: Supervision; SLP: Mod I  Expected length of stay: 7-12 days  Dietary Needs: Carb. Mod. diet restrictions Equipment Needs: TBD Special Service Needs: None Pt/Family Agrees to Admission and willing to participate: Yes Program Orientation Provided & Reviewed with Pt/Caregiver Including Roles  & Responsibilities: Yes Additional Information Needs: Niece may needs resources for hired in home assist  Information Needs to be Provided By: CSW FYI  Decrease burden of Care through IP rehab admission: No  Possible need for SNF placement upon discharge: No  Patient Condition: This patient's medical and  functional status has changed since the consult dated: 11/01/17 at 1139 in which the Rehabilitation Physician determined and documented that the patient's condition is appropriate for intensive rehabilitative care in an inpatient rehabilitation facility. See "History of Present Illness" (above) for medical update. Functional changes are: Mod A transfers and Min A gait. Patient's medical and functional status update has been discussed with the Rehabilitation physician and patient remains appropriate for inpatient rehabilitation. Will admit to inpatient rehab today.  Preadmission Screen Completed By:  Gunnar Fusi, 11/04/2017 11:14 AM ______________________________________________________________________   Discussed status with Dr. Letta Pate on 11/04/17 at 1115 and received telephone approval for admission today.  Admission Coordinator:  Gunnar Fusi, time 1115/Date 11/04/17

## 2017-11-03 NOTE — Progress Notes (Signed)
Inpatient Rehabilitation  Met with patient and niece, [name] at bedside to further discuss plans for IP Rehab.  Insurance has been initiated and we await a decision, hopeful for a decision tomorrow.  [name] hopes to return to work tomorrow so Rehab paperwork was completed today.  Plan to follow up with team regarding medical readiness when I have a decision.  Call if questions.    Carmelia Roller., CCC/SLP Admission Coordinator  [facility]  Cell [phone]

## 2017-11-03 NOTE — Progress Notes (Signed)
Initial Nutrition Assessment  DOCUMENTATION CODES:   Not applicable  INTERVENTION:   - Downgraded pt's diet to Dysphagia 3 per pt request  - Glucerna Shake po TID, each supplement provides 220 kcal and 10 grams of protein  - Documented likes and dislikes in diet software  - Encourage PO intake  NUTRITION DIAGNOSIS:   Inadequate oral intake related to lethargy/confusion, decreased appetite, other (see comment)(dry mouth) as evidenced by per patient/family report, meal completion < 50%.  GOAL:   Patient will meet greater than or equal to 90% of their needs  MONITOR:   PO intake, Supplement acceptance, Weight trends, Labs, I & O's  REASON FOR ASSESSMENT:   Rounds    ASSESSMENT:   84 year old female who presented to [facility] [place] on 10/28/17 with hematemesis and was unresponsive secondary to hypoxia. PMH significant for previous stroke, polycythemia vera, hypertension, endometrial carcinoma, diabetes mellitus, colon cancer, breast cancer, and arthritis.  CT of chest revealed pericardial effusion, small pleural effusions, and pulmonary edema. Pt noted to have N-STEMI and underwent cardiac cath. CT of heat showed right cerebral hemorrhage with IVH. Pt transferred to [facility].  Spoke with pt and family at bedside. Pt is poor historian but reports having a dry mouth. Per pt's family member, pt has not lost weight recently. Weight history in chart shows gradual weight loss over the past year (8.8% weight loss, not significant for timeframe).  Pt agreeable to Glucerna TID. Pt dislikes cheese. RD put preferences in diet software. Pt reports wanting softer food. RD downgraded diet to Dysphagia 3.  Per pt's family member, pt eats 2-3 meals daily and mostly eats out.  Meal Completion: 0-50% since 11/01/17  Medications reviewed and include: Ensure Enlive TID, 40 mg Lasix BID, sliding scale Novolog, 6 units Levemir daily, 40 mg Protonix daily, 20 mEq KCl BID, Senokot  Labs reviewed: magnesium 1.5  (L), potassium 3.3 (L), BUN 27 (H), creatinine 1.20 (H), hemoglobin 10.1 (L), HCT 34.4 (L) CBG's: 166, 144, 170, 161 x 24 hours  UOP: 1150 ml x 24 hours  NUTRITION - FOCUSED PHYSICAL EXAM:    Most Recent Value  Orbital Region  Mild depletion  Upper Arm Region  No depletion  Thoracic and Lumbar Region  No depletion  Buccal Region  No depletion  Temple Region  Mild depletion  Clavicle Bone Region  Moderate depletion  Clavicle and Acromion Bone Region  Moderate depletion  Scapular Bone Region  Unable to assess  Dorsal Hand  No depletion  Patellar Region  No depletion  Anterior Thigh Region  No depletion  Posterior Calf Region  No depletion  Edema (RD Assessment)  None  Hair  Reviewed  Eyes  Reviewed  Mouth  Reviewed  Skin  Reviewed  Nails  Reviewed       Diet Order:   Diet Order           DIET DYS 3 Room service appropriate? Yes; Fluid consistency: Thin  Diet effective now          EDUCATION NEEDS:   Education needs have been addressed  Skin:  Skin Assessment: Reviewed RN Assessment  Last BM:  11/02/17 small type 6  Height:   Ht Readings from Last 1 Encounters:  10/28/17 5\' 1"  (1.549 m)    Weight:   Wt Readings from Last 1 Encounters:  10/29/17 125 lb (56.7 kg)    Ideal Body Weight:  47.7 kg  BMI:  Body mass index is 23.62 kg/m.  Estimated Nutritional Needs:  Kcal:  1400-1600 kcal/day  Protein:  70-85 grams/day  Fluid:  1.4-1.6 L/day    Gaynell Face, MS, RD, LDN Pager: [phone] Weekend/After Hours: [phone]

## 2017-11-03 NOTE — Care Management Note (Signed)
Case Management Note  Patient Details  Name: [name] MRN: [id number] Date of Birth: 1931-09-28  Subjective/Objective:   Pt admitted on 10/29/17 with RT cerebellar hemorrhage with IVH.  PTA, pt independent, lives at home alone.                   Action/Plan: Family available to assist with care at discharge.  PT/OT recommending CIR, and insurance authorization has been initiated by Admission Coordinator for inpatient rehab.  Will follow progress.   Expected Discharge Date:                  Expected Discharge Plan:  [place]  In-House Referral:     Discharge planning Services  CM Consult  Post Acute Care Choice:    Choice offered to:     DME Arranged:    DME Agency:     HH Arranged:    [place] Agency:     Status of Service:  In process, will continue to follow  If discussed at Long Length of Stay Meetings, dates discussed:    Additional Comments:  Reinaldo Raddle, RN, BSN  Trauma/Neuro ICU Case Manager [phone]

## 2017-11-04 ENCOUNTER — Encounter (HOSPITAL_COMMUNITY): Payer: Self-pay

## 2017-11-04 ENCOUNTER — Other Ambulatory Visit: Payer: Self-pay

## 2017-11-04 ENCOUNTER — Inpatient Hospital Stay (HOSPITAL_COMMUNITY)
Admission: RE | Admit: 2017-11-04 | Discharge: 2017-11-17 | DRG: 056 | Disposition: A | Discharge status: Home Health | Payer: Medicare Other | Source: Intra-hospital | Attending: Physical Medicine & Rehabilitation | Admitting: Physical Medicine & Rehabilitation

## 2017-11-04 DIAGNOSIS — R278 Other lack of coordination: Secondary | ICD-10-CM | POA: Diagnosis present

## 2017-11-04 DIAGNOSIS — I69393 Ataxia following cerebral infarction: Secondary | ICD-10-CM

## 2017-11-04 DIAGNOSIS — Z7984 Long term (current) use of oral hypoglycemic drugs: Secondary | ICD-10-CM

## 2017-11-04 DIAGNOSIS — M19042 Primary osteoarthritis, left hand: Secondary | ICD-10-CM | POA: Diagnosis present

## 2017-11-04 DIAGNOSIS — I614 Nontraumatic intracerebral hemorrhage in cerebellum: Secondary | ICD-10-CM

## 2017-11-04 DIAGNOSIS — M19041 Primary osteoarthritis, right hand: Secondary | ICD-10-CM | POA: Diagnosis present

## 2017-11-04 DIAGNOSIS — I252 Old myocardial infarction: Secondary | ICD-10-CM

## 2017-11-04 DIAGNOSIS — I214 Non-ST elevation (NSTEMI) myocardial infarction: Secondary | ICD-10-CM | POA: Diagnosis present

## 2017-11-04 DIAGNOSIS — I7122 Aneurysm of the aortic arch, without rupture: Secondary | ICD-10-CM

## 2017-11-04 DIAGNOSIS — I8390 Asymptomatic varicose veins of unspecified lower extremity: Secondary | ICD-10-CM | POA: Diagnosis present

## 2017-11-04 DIAGNOSIS — I69122 Dysarthria following nontraumatic intracerebral hemorrhage: Secondary | ICD-10-CM | POA: Diagnosis not present

## 2017-11-04 DIAGNOSIS — Z853 Personal history of malignant neoplasm of breast: Secondary | ICD-10-CM | POA: Diagnosis not present

## 2017-11-04 DIAGNOSIS — Z9842 Cataract extraction status, left eye: Secondary | ICD-10-CM | POA: Diagnosis not present

## 2017-11-04 DIAGNOSIS — Z85038 Personal history of other malignant neoplasm of large intestine: Secondary | ICD-10-CM

## 2017-11-04 DIAGNOSIS — E785 Hyperlipidemia, unspecified: Secondary | ICD-10-CM

## 2017-11-04 DIAGNOSIS — R296 Repeated falls: Secondary | ICD-10-CM | POA: Diagnosis present

## 2017-11-04 DIAGNOSIS — I69193 Ataxia following nontraumatic intracerebral hemorrhage: Secondary | ICD-10-CM | POA: Diagnosis present

## 2017-11-04 DIAGNOSIS — Z803 Family history of malignant neoplasm of breast: Secondary | ICD-10-CM | POA: Diagnosis not present

## 2017-11-04 DIAGNOSIS — Z9841 Cataract extraction status, right eye: Secondary | ICD-10-CM | POA: Diagnosis not present

## 2017-11-04 DIAGNOSIS — Z9181 History of falling: Secondary | ICD-10-CM

## 2017-11-04 DIAGNOSIS — Z961 Presence of intraocular lens: Secondary | ICD-10-CM | POA: Diagnosis present

## 2017-11-04 DIAGNOSIS — I69322 Dysarthria following cerebral infarction: Secondary | ICD-10-CM

## 2017-11-04 DIAGNOSIS — Z806 Family history of leukemia: Secondary | ICD-10-CM

## 2017-11-04 DIAGNOSIS — I110 Hypertensive heart disease with heart failure: Secondary | ICD-10-CM | POA: Diagnosis present

## 2017-11-04 DIAGNOSIS — E1142 Type 2 diabetes mellitus with diabetic polyneuropathy: Secondary | ICD-10-CM | POA: Diagnosis present

## 2017-11-04 DIAGNOSIS — Z90710 Acquired absence of both cervix and uterus: Secondary | ICD-10-CM | POA: Diagnosis not present

## 2017-11-04 DIAGNOSIS — R269 Unspecified abnormalities of gait and mobility: Secondary | ICD-10-CM

## 2017-11-04 DIAGNOSIS — I5023 Acute on chronic systolic (congestive) heart failure: Secondary | ICD-10-CM | POA: Diagnosis present

## 2017-11-04 DIAGNOSIS — I313 Pericardial effusion (noninflammatory): Secondary | ICD-10-CM | POA: Diagnosis present

## 2017-11-04 DIAGNOSIS — Z87891 Personal history of nicotine dependence: Secondary | ICD-10-CM

## 2017-11-04 DIAGNOSIS — Z789 Other specified health status: Secondary | ICD-10-CM | POA: Diagnosis not present

## 2017-11-04 DIAGNOSIS — D72829 Elevated white blood cell count, unspecified: Secondary | ICD-10-CM | POA: Diagnosis present

## 2017-11-04 DIAGNOSIS — I69398 Other sequelae of cerebral infarction: Secondary | ICD-10-CM

## 2017-11-04 DIAGNOSIS — Z833 Family history of diabetes mellitus: Secondary | ICD-10-CM

## 2017-11-04 DIAGNOSIS — Z79899 Other long term (current) drug therapy: Secondary | ICD-10-CM

## 2017-11-04 DIAGNOSIS — Z7902 Long term (current) use of antithrombotics/antiplatelets: Secondary | ICD-10-CM

## 2017-11-04 DIAGNOSIS — E119 Type 2 diabetes mellitus without complications: Secondary | ICD-10-CM

## 2017-11-04 DIAGNOSIS — E1165 Type 2 diabetes mellitus with hyperglycemia: Secondary | ICD-10-CM | POA: Diagnosis not present

## 2017-11-04 DIAGNOSIS — D45 Polycythemia vera: Secondary | ICD-10-CM | POA: Diagnosis present

## 2017-11-04 DIAGNOSIS — I10 Essential (primary) hypertension: Secondary | ICD-10-CM

## 2017-11-04 DIAGNOSIS — I712 Thoracic aortic aneurysm, without rupture: Secondary | ICD-10-CM

## 2017-11-04 DIAGNOSIS — Z7983 Long term (current) use of bisphosphonates: Secondary | ICD-10-CM

## 2017-11-04 DIAGNOSIS — E876 Hypokalemia: Secondary | ICD-10-CM

## 2017-11-04 DIAGNOSIS — Z8542 Personal history of malignant neoplasm of other parts of uterus: Secondary | ICD-10-CM

## 2017-11-04 DIAGNOSIS — Z85828 Personal history of other malignant neoplasm of skin: Secondary | ICD-10-CM

## 2017-11-04 DIAGNOSIS — Z8249 Family history of ischemic heart disease and other diseases of the circulatory system: Secondary | ICD-10-CM

## 2017-11-04 DIAGNOSIS — Z8679 Personal history of other diseases of the circulatory system: Secondary | ICD-10-CM | POA: Diagnosis not present

## 2017-11-04 DIAGNOSIS — I2510 Atherosclerotic heart disease of native coronary artery without angina pectoris: Secondary | ICD-10-CM

## 2017-11-04 DIAGNOSIS — R29701 NIHSS score 1: Secondary | ICD-10-CM | POA: Diagnosis present

## 2017-11-04 LAB — BASIC METABOLIC PANEL
Anion gap: 10 (ref 5–15)
BUN: 26 mg/dL — ABNORMAL HIGH (ref 6–20)
CO2: 26 mmol/L (ref 22–32)
Calcium: 8.4 mg/dL — ABNORMAL LOW (ref 8.9–10.3)
Chloride: 107 mmol/L (ref 101–111)
Creatinine, Ser: 1.29 mg/dL — ABNORMAL HIGH (ref 0.44–1.00)
GFR calc Af Amer: 43 mL/min — ABNORMAL LOW (ref >60)
GFR calc non Af Amer: 37 mL/min — ABNORMAL LOW (ref >60)
Glucose, Bld: 166 mg/dL — ABNORMAL HIGH (ref 65–99)
Potassium: 3.2 mmol/L — ABNORMAL LOW (ref 3.5–5.1)
Sodium: 143 mmol/L (ref 135–145)

## 2017-11-04 LAB — CBC
HCT: 37.5 % (ref 36.0–46.0)
Hemoglobin: 11.3 g/dL — ABNORMAL LOW (ref 12.0–15.0)
MCH: 26.3 pg (ref 26.0–34.0)
MCHC: 30.1 g/dL (ref 30.0–36.0)
MCV: 87.4 fL (ref 78.0–100.0)
Platelets: 245 10*3/uL (ref 150–400)
RBC: 4.29 MIL/uL (ref 3.87–5.11)
RDW: 18.2 % — ABNORMAL HIGH (ref 11.5–15.5)
WBC: 17 10*3/uL — ABNORMAL HIGH (ref 4.0–10.5)

## 2017-11-04 LAB — GLUCOSE, CAPILLARY
Glucose-Capillary: 156 mg/dL — ABNORMAL HIGH (ref 65–99)
Glucose-Capillary: 155 mg/dL — ABNORMAL HIGH (ref 65–99)
Glucose-Capillary: 136 mg/dL — ABNORMAL HIGH (ref 65–99)

## 2017-11-04 LAB — CREATININE, SERUM
Creatinine, Ser: 1.37 mg/dL — ABNORMAL HIGH (ref 0.44–1.00)
GFR calc Af Amer: 40 mL/min — ABNORMAL LOW (ref >60)
GFR calc non Af Amer: 35 mL/min — ABNORMAL LOW (ref >60)

## 2017-11-04 MED ORDER — GLUCERNA SHAKE PO LIQD: 237.0000 mL | Freq: Three times a day (TID) | ORAL | 0 refills | Status: DC

## 2017-11-04 MED ORDER — CARVEDILOL 12.5 MG PO TABS
12.5000 mg | ORAL_TABLET | Freq: Two times a day (BID) | ORAL | Status: DC
  Administered 2017-11-04 – 2017-11-07 (×6): 12.5 mg via ORAL
  Filled 2017-11-04 (×6): qty 1

## 2017-11-04 MED ORDER — NITROGLYCERIN 0.2 MG/HR TD PT24: 0.2000 mg | MEDICATED_PATCH | Freq: Every day | TRANSDERMAL | 12 refills | Status: DC

## 2017-11-04 MED ORDER — FUROSEMIDE 40 MG PO TABS: 40.0000 mg | ORAL_TABLET | Freq: Every day | ORAL | Status: DC

## 2017-11-04 MED ORDER — CARVEDILOL 12.5 MG PO TABS: 12.5000 mg | ORAL_TABLET | Freq: Two times a day (BID) | ORAL | Status: DC

## 2017-11-04 MED ORDER — SPIRONOLACTONE 25 MG PO TABS: 25.0000 mg | ORAL_TABLET | Freq: Every day | ORAL | Status: DC

## 2017-11-04 MED ORDER — SENNOSIDES-DOCUSATE SODIUM 8.6-50 MG PO TABS
1.0000 | ORAL_TABLET | Freq: Two times a day (BID) | ORAL | Status: DC
  Administered 2017-11-04 – 2017-11-17 (×26): 1 via ORAL
  Filled 2017-11-04 (×26): qty 1

## 2017-11-04 MED ORDER — HYDROXYUREA 500 MG PO CAPS
500.0000 mg | ORAL_CAPSULE | ORAL | Status: DC
  Administered 2017-11-05 – 2017-11-17 (×11): 500 mg via ORAL
  Filled 2017-11-04 (×11): qty 1

## 2017-11-04 MED ORDER — ENOXAPARIN SODIUM 30 MG/0.3ML ~~LOC~~ SOLN: 30.0000 mg | SUBCUTANEOUS | Status: DC

## 2017-11-04 MED ORDER — POTASSIUM CHLORIDE CRYS ER 20 MEQ PO TBCR: 20.0000 meq | EXTENDED_RELEASE_TABLET | Freq: Every day | ORAL | Status: DC

## 2017-11-04 MED ORDER — FUROSEMIDE 40 MG PO TABS
40.0000 mg | ORAL_TABLET | Freq: Every day | ORAL | Status: DC
  Administered 2017-11-05 – 2017-11-17 (×13): 40 mg via ORAL
  Filled 2017-11-04 (×13): qty 1

## 2017-11-04 MED ORDER — INSULIN ASPART 100 UNIT/ML ~~LOC~~ SOLN
0.0000 [IU] | Freq: Three times a day (TID) | SUBCUTANEOUS | Status: DC
  Administered 2017-11-04 – 2017-11-05 (×4): 1 [IU] via SUBCUTANEOUS
  Administered 2017-11-06: 2 [IU] via SUBCUTANEOUS
  Administered 2017-11-06: 1 [IU] via SUBCUTANEOUS
  Administered 2017-11-07 – 2017-11-09 (×7): 2 [IU] via SUBCUTANEOUS
  Administered 2017-11-09 – 2017-11-10 (×2): 1 [IU] via SUBCUTANEOUS
  Administered 2017-11-10 – 2017-11-11 (×3): 2 [IU] via SUBCUTANEOUS
  Administered 2017-11-12: 1 [IU] via SUBCUTANEOUS
  Administered 2017-11-12: 2 [IU] via SUBCUTANEOUS
  Administered 2017-11-12: 3 [IU] via SUBCUTANEOUS
  Administered 2017-11-13 – 2017-11-14 (×3): 2 [IU] via SUBCUTANEOUS
  Administered 2017-11-14: 1 [IU] via SUBCUTANEOUS
  Administered 2017-11-14: 2 [IU] via SUBCUTANEOUS
  Administered 2017-11-15 (×3): 1 [IU] via SUBCUTANEOUS
  Administered 2017-11-16: 2 [IU] via SUBCUTANEOUS
  Administered 2017-11-16: 1 [IU] via SUBCUTANEOUS
  Administered 2017-11-16: 2 [IU] via SUBCUTANEOUS
  Administered 2017-11-17: 1 [IU] via SUBCUTANEOUS
  Administered 2017-11-17: 2 [IU] via SUBCUTANEOUS

## 2017-11-04 MED ORDER — POTASSIUM CHLORIDE CRYS ER 20 MEQ PO TBCR
20.0000 meq | EXTENDED_RELEASE_TABLET | Freq: Every day | ORAL | Status: DC
  Administered 2017-11-05 – 2017-11-10 (×6): 20 meq via ORAL
  Filled 2017-11-04 (×6): qty 1

## 2017-11-04 MED ORDER — GLUCERNA SHAKE PO LIQD
237.0000 mL | Freq: Three times a day (TID) | ORAL | Status: DC
  Administered 2017-11-04 – 2017-11-17 (×25): 237 mL via ORAL

## 2017-11-04 MED ORDER — SODIUM CHLORIDE 0.9% FLUSH: 10.0000 mL | INTRAVENOUS | Status: DC | PRN

## 2017-11-04 MED ORDER — NITROGLYCERIN 0.2 MG/HR TD PT24
0.2000 mg | MEDICATED_PATCH | Freq: Every day | TRANSDERMAL | Status: DC
  Administered 2017-11-05 – 2017-11-13 (×9): 0.2 mg via TRANSDERMAL
  Filled 2017-11-04 (×10): qty 1

## 2017-11-04 MED ORDER — ENOXAPARIN SODIUM 30 MG/0.3ML ~~LOC~~ SOLN
30.0000 mg | SUBCUTANEOUS | Status: DC
  Administered 2017-11-05 – 2017-11-17 (×13): 30 mg via SUBCUTANEOUS
  Filled 2017-11-04 (×13): qty 0.3

## 2017-11-04 MED ORDER — RAMIPRIL 10 MG PO CAPS
10.0000 mg | ORAL_CAPSULE | Freq: Two times a day (BID) | ORAL | Status: DC
  Administered 2017-11-04 – 2017-11-17 (×26): 10 mg via ORAL
  Filled 2017-11-04 (×26): qty 1

## 2017-11-04 MED ORDER — HYDROXYUREA 500 MG PO CAPS: 500.0000 mg | ORAL_CAPSULE | Freq: Every day | ORAL | Status: DC

## 2017-11-04 MED ORDER — ASPIRIN 81 MG PO CHEW
81.0000 mg | CHEWABLE_TABLET | Freq: Every day | ORAL | Status: DC
  Administered 2017-11-05 – 2017-11-17 (×13): 81 mg via ORAL
  Filled 2017-11-04 (×13): qty 1

## 2017-11-04 MED ORDER — NITROGLYCERIN 0.2 MG/HR TD PT24
0.2000 mg | MEDICATED_PATCH | Freq: Every day | TRANSDERMAL | Status: DC
  Administered 2017-11-04: 0.2 mg via TRANSDERMAL
  Filled 2017-11-04: qty 1

## 2017-11-04 MED ORDER — ORAL CARE MOUTH RINSE: 15.0000 mL | Freq: Two times a day (BID) | OROMUCOSAL | 0 refills | Status: DC

## 2017-11-04 MED ORDER — SPIRONOLACTONE 25 MG PO TABS
25.0000 mg | ORAL_TABLET | Freq: Every day | ORAL | Status: DC
  Administered 2017-11-05 – 2017-11-17 (×13): 25 mg via ORAL
  Filled 2017-11-04 (×13): qty 1

## 2017-11-04 MED ORDER — PANTOPRAZOLE SODIUM 40 MG PO TBEC
40.0000 mg | DELAYED_RELEASE_TABLET | Freq: Every day | ORAL | Status: DC
  Administered 2017-11-05 – 2017-11-17 (×13): 40 mg via ORAL
  Filled 2017-11-04 (×13): qty 1

## 2017-11-04 MED ORDER — POTASSIUM CHLORIDE CRYS ER 20 MEQ PO TBCR
40.0000 meq | EXTENDED_RELEASE_TABLET | Freq: Once | ORAL | Status: AC
  Administered 2017-11-04: 40 meq via ORAL
  Filled 2017-11-04: qty 2

## 2017-11-04 MED ORDER — INSULIN DETEMIR 100 UNIT/ML ~~LOC~~ SOLN
6.0000 [IU] | Freq: Every day | SUBCUTANEOUS | Status: DC
  Administered 2017-11-04 – 2017-11-07 (×4): 6 [IU] via SUBCUTANEOUS
  Filled 2017-11-04 (×4): qty 0.06

## 2017-11-04 MED ORDER — INSULIN DETEMIR 100 UNIT/ML ~~LOC~~ SOLN: 6.0000 [IU] | Freq: Every day | SUBCUTANEOUS | 11 refills | Status: DC

## 2017-11-04 NOTE — Progress Notes (Signed)
Inpatient Rehabilitation  I have received insurance authorization for an IP Rehab admission today.  I have received acute medical clearance and have a bed to offer as well.  Patient and niece in agreement with plan.  Will proceed with admission.  Updated team.  Call if questions.  Carmelia Roller., CCC/SLP Admission Coordinator  [place]  Cell [phone]

## 2017-11-04 NOTE — Progress Notes (Signed)
Subjective:  Doing well denies any chest pain or shortness of breath.  Objective:  Vital Signs in the last 24 hours: Temp:  [97.5 F (36.4 C)-98.7 F (37.1 C)] 97.5 F (36.4 C) (05/16 0800) Pulse Rate:  [35-81] 70 (05/16 0900) Resp:  [17-28] 23 (05/16 0900) BP: (105-144)/(59-91) 133/72 (05/16 0900) SpO2:  [93 %-98 %] 95 % (05/16 0900)  Intake/Output from previous day: 05/15 0701 - 05/16 0700 In: 700 [P.O.:600; IV Piggyback:100] Out: 540 [Urine:540] Intake/Output from this shift: Total I/O In: -  Out: 400 [Urine:400]  Physical Exam: Neck: no adenopathy, no carotid bruit, no JVD and supple, symmetrical, trachea midline Lungs: Decreased breath sound at bases left more than right with occasional left basilar rhonchi air entry has improved Heart: regular rate and rhythm, S1, S2 normal and Soft systolic murmur noted Abdomen: soft, non-tender; bowel sounds normal; no masses,  no organomegaly Extremities: extremities normal, atraumatic, no cyanosis or edema  Lab Results: Recent Labs    11/02/17 0530 11/03/17 0440  WBC 18.4* 15.7*  HGB 10.6* 10.1*  PLT 249 229   Recent Labs    11/03/17 0440 11/03/17 1235 11/04/17 0500  NA 145 144 143  K 3.3*  --  3.2*  CL 111  --  107  CO2 24  --  26  GLUCOSE 155*  --  166*  BUN 27*  --  26*  CREATININE 1.20*  --  1.29*   No results for input(s): TROPONINI in the last 72 hours.  Invalid input(s): CK, MB Hepatic Function Panel No results for input(s): PROT, ALBUMIN, AST, ALT, ALKPHOS, BILITOT, BILIDIR, IBILI in the last 72 hours. No results for input(s): CHOL in the last 72 hours. No results for input(s): PROTIME in the last 72 hours.  Imaging: Imaging results have been reviewed and Ct Head Wo Contrast  Result Date: 11/03/2017 CLINICAL DATA:  84 y/o  F; intracranial hemorrhage for follow-up. EXAM: CT HEAD WITHOUT CONTRAST TECHNIQUE: Contiguous axial images were obtained from the base of the skull through the vertex without  intravenous contrast. COMPARISON:  10/29/2017 and 10/31/2017 CT head. 11/01/2017 MRI head. FINDINGS: Brain: Stable acute hemorrhage within the right cerebellum measuring up to 27 mm as well as associated edema and mass effect. No evidence for interval acute stroke, hemorrhage, or focal mass effect of the brain. Stable chronic microvascular ischemic changes and parenchymal volume loss. Stable small chronic lacunar infarcts within the bilateral basal ganglia. Vascular: Mild calcific atherosclerosis of carotid siphons. No hyperdense vessel identified. Skull: Normal. Negative for fracture or focal lesion. Sinuses/Orbits: Partial opacification of right-sided ethmoid air cells. Opacification of left mastoid tip. Bilateral intra-ocular lens replacement. Other: None. IMPRESSION: 1. Stable small hemorrhage within the right cerebellar hemisphere. Stable mild associated edema and local mass effect. 2. No new acute intracranial abnormality. 3. Stable chronic microvascular ischemic changes and parenchymal volume loss of the brain. Electronically Signed   By: Kristine Garbe M.D.   On: 11/03/2017 01:26   Dg Chest Port 1 View  Result Date: 11/03/2017 CLINICAL DATA:  Elevated white blood cell count, pulmonary edema and respiratory failure, intraventricular hemorrhage. EXAM: PORTABLE CHEST 1 VIEW COMPARISON:  Portable chest x-ray of Oct 31, 2017 FINDINGS: The lungs are well-expanded. The interstitial markings are increased and more conspicuous today. The pulmonary vascularity is engorged and more prominent. The cardiac silhouette remains enlarged. There is calcification in the wall of the thoracic aorta. There is a small left pleural effusion. There is chronic compression of a midthoracic vertebral body.  The right-sided PICC line tip projects over the midportion of the SVC. IMPRESSION: Worsening of pulmonary interstitial edema secondary to CHF. Possible left lower lobe atelectasis or pneumonia. Thoracic aortic  atherosclerosis. Electronically Signed   By: David  Martinique M.D.   On: 11/03/2017 09:49    Cardiac Studies:  Assessment/Plan:  Status post acute anteroseptal wall MI with atypical presentation Multivessel calcific 3 vessel disease Resolving decompensated systolic congestive heart failure Acute right cerebellar bleed with extension to IVH Hypertension Diabetes mellitus History of polycythemiavera History of TIA in the past Osteoporosis Marked leukocytosis improved. Left lower lobe atelectasis versus pneumonia Hypokalemia PLAN Switch IV Lasix to by mouth as per orders K has been replaced. I will sign off please call if needed Start aspirin 81 mg daily if okay with neurology when appropriate. Add Nitro-Dur patch 0.2 mg per hour daily Follow-up with me in 2 weeks   LOS: 6 days    Charolette Forward 11/04/2017, 10:17 AM

## 2017-11-04 NOTE — Plan of Care (Signed)
  Problem: Consults Goal: RH STROKE PATIENT EDUCATION Description See Patient Education module for education specifics  Outcome: Progressing Goal: Diabetes Guidelines if Diabetic/Glucose > 140 Description If diabetic or lab glucose is > 140 mg/dl - Initiate Diabetes/Hyperglycemia Guidelines & Document Interventions  Outcome: Progressing   Problem: RH BOWEL ELIMINATION Goal: RH STG MANAGE BOWEL WITH ASSISTANCE Description STG Manage Bowel with Assistance. Outcome: Progressing   Problem: RH BLADDER ELIMINATION Goal: RH STG MANAGE BLADDER WITH ASSISTANCE Description STG Manage Bladder With Assistance Outcome: Progressing Goal: RH STG MANAGE BLADDER WITH EQUIPMENT WITH ASSISTANCE Description STG Manage Bladder With Equipment With Assistance Outcome: Progressing Goal: RH OTHER STG BLADDER ELIMINATION GOALS W/ASSIST Description Other STG Bladder Elimination Goals With Assistance Outcome: Progressing   Problem: RH SKIN INTEGRITY Goal: RH STG MAINTAIN SKIN INTEGRITY WITH ASSISTANCE Description STG Maintain Skin Integrity With Assistance. Outcome: Progressing   Problem: RH SAFETY Goal: RH STG ADHERE TO SAFETY PRECAUTIONS W/ASSISTANCE/DEVICE Description STG Adhere to Safety Precautions With Assistance/Device. Outcome: Progressing Goal: RH STG DECREASED RISK OF FALL WITH ASSISTANCE Description STG Decreased Risk of Fall With Assistance. Outcome: Progressing Goal: RH STG DEMO UNDERSTANDING HOME SAFETY PRECAUTIONS Outcome: Progressing Goal: RH OTHER STG SAFETY GOALS W/ASSIST Description Other STG Safety Goals With Assistance. Outcome: Progressing   Problem: RH COGNITION-NURSING Goal: RH STG USES MEMORY AIDS/STRATEGIES W/ASSIST TO PROBLEM SOLVE Description STG Uses Memory Aids/Strategies With Assistance to Problem Solve. Outcome: Progressing Goal: RH STG ANTICIPATES NEEDS/CALLS FOR ASSIST W/ASSIST/CUES Description STG Anticipates Needs/Calls for Assist With  Assistance/Cues. Outcome: Progressing   Problem: RH PAIN MANAGEMENT Goal: RH STG PAIN MANAGED AT OR BELOW PT'S PAIN GOAL Outcome: Progressing Goal: RH OTHER STG PAIN MANAGEMENT GOALS W/ASSIST Description Other STG Pain Management Goals With Assistance. Outcome: Progressing   Problem: RH KNOWLEDGE DEFICIT Goal: RH STG INCREASE KNOWLEDGE OF DIABETES Outcome: Progressing Goal: RH STG INCREASE KNOWLEDGE OF HYPERTENSION Outcome: Progressing Goal: RH STG INCREASE KNOWLEDGE OF DYSPHAGIA/FLUID INTAKE Outcome: Progressing

## 2017-11-04 NOTE — Care Management Note (Signed)
Case Management Note  Patient Details  Name: DASIE CHANCELLOR MRN: [id number] Date of Birth: 1931/10/14  Subjective/Objective:   Pt admitted on 10/29/17 with RT cerebellar hemorrhage with IVH.  PTA, pt independent, lives at home alone.                   Action/Plan: Family available to assist with care at discharge.  PT/OT recommending CIR, and insurance authorization has been initiated by Admission Coordinator for inpatient rehab.  Will follow progress.   Expected Discharge Date:  11/04/17               Expected Discharge Plan:  [place]  In-House Referral:     Discharge planning Services  CM Consult  Post Acute Care Choice:    Choice offered to:     DME Arranged:    DME Agency:     HH Arranged:    HH Agency:     Status of Service:  Completed, signed off  If discussed at [employer], dates discussed:    Additional Comments:  11/04/17 J. Clydell Alberts, Therapist, sports, BSN Pt medically stable for [employer] and insurance authorization received for CIR admission.  Plan dc to [facility] IP rehab later today.  Reinaldo Raddle, RN, BSN  Trauma/Neuro ICU Case Manager [phone]

## 2017-11-04 NOTE — H&P (Signed)
Physical Medicine and Rehabilitation Admission H&P     Chief complaint: Weakness   HPI: [name]. Bedore is an 84 year old right-handed female with history of adenosarcoma of cervix, endometrial carcinoma status post total abdominal hysterectomy, diabetes mellitus, polycythemia vera, TIA with recurrent falls maintained on Plavix.  Per chart review and niece patient lives alone.  One level home 2 steps to entry.  Reportedly independent prior to admission and driving.  Presented to [facility] 10/28/2017 with altered mental status, hematemesis and lethargy as well as difficulty in breathing.  Oxygen saturations in the 70s requiring nonrebreather mask.  Blood pressure 174/113, WBC 26,000, troponin 1.02, lactic acid 3.1.  CT angiogram of the chest showed pericardial effusion and cardiomegaly consistent with pulmonary edema.  CT of the head reviewed showing right cerebellar hemorrhage.  Per report, right cerebellar parenchymal hemorrhage measuring 31 x 16 x 19 mm.  There was extension of hemorrhage into the fourth ventricle without hydrocephalus.  Patient initially had been placed on 3% saline.  Patient was ruled in for non-Q wave myocardial infarction underwent cardiac catheterization showing three-vessel disease with severe LV dysfunction.  CVTS consulted for possible need for CABG was not felt to be a good candidate due to multiple comorbidities.  She was transferred to [facility] for further evaluation.  Echocardiogram with ejection fraction 25% as well as akinesis of the anterior myocardium.  Neurosurgery Dr. Sherley Bounds follow-up for cerebellar hemorrhage advise conservative care with follow-up cranial CT scan 10/31/2017 as well as MRI 11/01/2017 stable.  Subcutaneous Lovenox was later initiated for DVT prophylaxis.  Placed on intravenous Lasix for diuresis per cardiology services.  Plan was to consider PCI only once able to tolerate dual antiplatelet medications.  Patient was cleared to begin aspirin 81 mg daily  11/05/2017.  Physical and occupational therapy evaluations completed with recommendations of physical medicine rehab consult.  Patient was admitted for a comprehensive rehab program.   Review of Systems  Constitutional: Negative for chills and fever.  HENT: Negative for hearing loss.   Eyes: Negative for blurred vision and double vision.  Respiratory: Positive for shortness of breath.   Cardiovascular: Positive for palpitations and leg swelling.  Gastrointestinal: Positive for constipation, nausea and vomiting.  Genitourinary: Negative for dysuria, flank pain and hematuria.  Musculoskeletal: Positive for joint pain and myalgias.  Skin: Negative for rash.  Neurological: Negative for seizures.  All other systems reviewed and are negative.       Past Medical History:  Diagnosis Date  . Adenocarcinoma in situ of cervix    . Arthritis      hands  . Breast cancer ([place])    . Colon cancer ([place])    . Diabetes mellitus without complication ([place])    . Endometrial carcinoma ([facility])      s/p total abdominal hysterectomy  . H/O compression fracture of spine 2014    thoracic spine  . H/O polycythemia vera    . H/O TIA (transient ischemic attack) and stroke 09/2014, 03/2015    No deficits  . Hypertension    . Hyperuricemia    . Microalbuminuria    . Polycythemia vera ([place])    . Recurrent falls    . Skin cancer      face, legs  . Stroke [facility]) 2008    no deficits  . Trochanteric bursitis    . Varicose veins      treated         Past Surgical History:  Procedure Laterality Date  .  ABDOMINAL HYSTERECTOMY      . BOWEL RESECTION N/A 03/28/2015    Procedure: SMALL BOWEL RESECTION;  Surgeon: Leonie Green, MD;  Location: [facility] ORS;  Service: General;  Laterality: N/A;  . CATARACT EXTRACTION W/ INTRAOCULAR LENS IMPLANT Right    . CATARACT EXTRACTION W/PHACO Left 10/07/2015    Procedure: CATARACT EXTRACTION PHACO AND INTRAOCULAR LENS PLACEMENT (IOC);  Surgeon: Ronnell Freshwater, MD;   Location: [place];  Service: Ophthalmology;  Laterality: Left;  DIABETIC - oral meds VISION BLUE  . CORONARY ANGIOGRAPHY N/A 10/29/2017    Procedure: CORONARY ANGIOGRAPHY;  Surgeon: Dionisio David, MD;  Location: [place] [facility];  Service: Cardiovascular;  Laterality: N/A;  . EXPLORATORY LAPAROTOMY        for fibroids  . LEFT HEART CATH Right 10/29/2017    Procedure: Left Heart Cath;  Surgeon: Dionisio David, MD;  Location: [place] [facility];  Service: Cardiovascular;  Laterality: Right;  . TONSILLECTOMY             Family History  Problem Relation Age of Onset  . Cancer Brother          AML  . Heart attack Mother    . Breast cancer Mother    . Heart attack Father    . Heart attack Sister    . Diabetes Sister      Social History:  reports that she has quit smoking. She has never used smokeless tobacco. She reports that she does not drink alcohol or use drugs. Allergies:       Allergies  Allergen Reactions  . Simvastatin        Other reaction(s): Muscle Pain          Medications Prior to Admission  Medication Sig Dispense Refill  . alendronate (FOSAMAX) 70 MG tablet Take 70 mg by mouth once a week. Take with a full glass of water on an empty stomach.      . calcium-vitamin D (OSCAL WITH D) 500-200 MG-UNIT per tablet Take 1 tablet by mouth daily.       . carvedilol (COREG) 6.25 MG tablet Take 6.25 mg by mouth 2 (two) times daily with a meal.      . chlorthalidone (HYGROTON) 25 MG tablet Take 25 mg by mouth daily.      . clopidogrel (PLAVIX) 75 MG tablet Take 1 tablet (75 mg total) by mouth daily. 30 tablet 0  . docusate sodium (COLACE) 250 MG capsule Take 250 mg by mouth daily as needed for constipation.      . fexofenadine (ALLEGRA) 180 MG tablet Take 180 mg by mouth daily as needed.       . hydroxyurea (HYDREA) 500 MG capsule TAKE 1 CAPSULE BY MOUTH ON MONDAY WEDNESDAY FRIDAY AND Saturday. Take  1 capsule every other Tuesday WITH FOOD (Patient taking  differently: 500 mg. TAKE 1 CAPSULE BY MOUTH ON Sunday through Friday.  Do no take on Saturday) 54 capsule 1  . loperamide (IMODIUM) 2 MG capsule Take 4 mg by mouth as needed for diarrhea or loose stools.      . metFORMIN (GLUCOPHAGE) 1000 MG tablet Take 1,000 mg by mouth 2 (two) times daily.       . pioglitazone (ACTOS) 15 MG tablet Take 15 mg by mouth daily.      . polyethylene glycol (MIRALAX / GLYCOLAX) packet Take 17 g by mouth daily as needed.      . potassium chloride (K-DUR,KLOR-CON) 10 MEQ  tablet Take 10 mEq by mouth daily.      . pravastatin (PRAVACHOL) 20 MG tablet Take 20 mg by mouth at bedtime.       . Probiotic Product (PROBIOTIC DAILY PO) Take 1 tablet by mouth daily.      . ramipril (ALTACE) 10 MG capsule Take 10 mg by mouth 2 (two) times daily.       . verapamil (CALAN-SR) 240 MG CR tablet Take 240 mg by mouth daily.          Drug Regimen Review Drug regimen was reviewed and remains appropriate with no significant issues identified   Home: Home Living Family/patient expects to be discharged to:: Inpatient rehab Living Arrangements: Alone Available Help at Discharge: Family, Available PRN/intermittently Type of Home: House Home Access: Stairs to enter [employer] of Steps: 2 Entrance Stairs-Rails: Left Home Layout: One level Bathroom Shower/Tub: Multimedia programmer: Handicapped height Home Equipment: [place] - single point, Environmental consultant - 2 wheels  Lives With: Alone   Functional History: Prior Function Level of Independence: Independent Comments: ADLs, IADLs, and driving. 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   Functional Status:  Mobility: Bed Mobility Overal bed mobility: Needs Assistance Bed Mobility: Supine to Sit Supine to sit: Min assist General bed mobility comments: exiting the bed on L side with (A) to elevate trunk off bed surface. pt reports dizziness initially Transfers Overall transfer level: Needs assistance Equipment used: Rolling  walker (2 wheeled) Transfers: Sit to/from Stand Sit to Stand: Mod assist General transfer comment: pt needs (A) To power up and surface elevated to help with power up Ambulation/Gait Ambulation/Gait assistance: Min assist Ambulation Distance (Feet): 80 Feet Assistive device: Rolling walker (2 wheeled) Gait Pattern/deviations: Step-through pattern, Decreased stride length, Trunk flexed General Gait Details: cues for postural check, positioning in the RW, needed occasional stability assist and control of the RW. Gait velocity: slower Gait velocity interpretation: <1.31 ft/sec, indicative of household ambulator   ADL: ADL Overall ADL's : Needs assistance/impaired Eating/Feeding: Set up Eating/Feeding Details (indicate cue type and reason): niece often holding cups for patient and placing to mouth Grooming: Wash/dry face, Oral care, Standing Grooming Details (indicate cue type and reason): mod cues; min (A) standing to min guard (A) lateral support L UE Upper Body Bathing: Minimal assistance, Sitting Lower Body Bathing: Maximal assistance, Sit to/from stand Upper Body Dressing : Minimal assistance, Sitting Lower Body Dressing: Maximal assistance Lower Body Dressing Details (indicate cue type and reason): Max A to don socks at EOB. Pt able to don left sock with assistance to start task and then maintain left ankle on right knee. Toilet Transfer: Moderate assistance, BSC Toilet Transfer Details (indicate cue type and reason): requires cues for bil UE on BSC. pt needs cues to anterior weight shift. pt holding RW and pushing backward Toileting- Clothing Manipulation and Hygiene: Maximal assistance, Sit to/from stand, +2 for physical assistance Toileting - Clothing Manipulation Details (indicate cue type and reason): Pt able to maintain standing balance with Min A for right lateral lean while second person performs toilet hygiene. Pt too fatigued to perform self peri care Functional mobility  during ADLs: +2 for safety/equipment, +2 for physical assistance, Moderate assistance, Rolling walker General ADL Comments: Pt requires cues for hand placement and to turn RW. pt needs physical (A) to advance RW at times due to fatigue. pt with stable VSS throughout session. Pt with HR 89-94. pt with RR 28-44 with decr to 84% RA with transfer. pt static standing at  sink 89-93% RA   Cognition: Cognition Overall Cognitive Status: Impaired/Different from baseline Arousal/Alertness: Awake/alert Orientation Level: Oriented X4(forgetful intermittently) Attention: Focused, Sustained, Selective, Alternating Focused Attention: Appears intact Sustained Attention: Impaired Sustained Attention Impairment: Verbal basic, Functional basic Selective Attention: Impaired Selective Attention Impairment: Verbal basic, Functional basic Alternating Attention: Impaired Alternating Attention Impairment: Verbal basic Memory: Impaired Memory Impairment: Storage deficit, Decreased recall of new information(Verbal recall 10/15pt) Awareness: Impaired Awareness Impairment: Emergent impairment(pt felt testing did not go as well as she expected) Problem Solving: Impaired Problem Solving Impairment: Verbal basic(sustained attention impacting) Executive Function: Reasoning, Initiating Reasoning: Impaired Reasoning Impairment: Verbal basic, Functional basic Initiating: Impaired Initiating Impairment: Functional basic Behaviors: Perseveration, Other (comment)(avoidant) Cognition Arousal/Alertness: Awake/alert Behavior During Therapy: Flat affect Overall Cognitive Status: Impaired/Different from baseline Area of Impairment: Attention, Following commands, Safety/judgement, Awareness Current Attention Level: Selective Following Commands: Follows one step commands with increased time, Follows multi-step commands inconsistently Safety/Judgement: Decreased awareness of deficits Awareness: Emergent Problem Solving: Slow  processing, Decreased initiation, Difficulty sequencing General Comments: pt requires set by set processing to complete oral care. pt stops and states "what do you want me to do now"   Physical Exam: Blood pressure 132/83, pulse 74, temperature 98.3 F (36.8 C), temperature source Oral, resp. rate (!) 24, weight 56.7 kg (125 lb), SpO2 96 %. Physical Exam  Vitals reviewed. HENT:  Head: Normocephalic.  Eyes: EOM are normal.  Neck: Normal range of motion. Neck supple. No thyromegaly present.  Cardiovascular:  Cardiac rate controlled  Respiratory:  Fair inspiratory effort clear to auscultation  Left sided rales at the base GI: Soft. Bowel sounds are normal. She exhibits no distension.  Skin.  Warm and dry Neurological.  Patient is alert provides her name and age follows full commands  Moderately severe ataxic dysarthria. Oriented to person place day and date Follows simple and two-step commands Motor strength is 4/5 in the right deltoid, bicep, tricep, grip, hip flexor, knee extensor, ankle dorsiflexor 5/5 in the left deltoid, bicep, tricep, grip, hip flexor, knee extensor, ankle dorsiflexor Sensation intact light touch bilateral upper and lower limbs There is mild dysmetria right finger-nose-finger and right heel-to-shin.  No evidence dysmetria left finger-nose-finger or heel shin There is no evidence of nystagmus Extraocular muscles appear intact     Lab Results Last 48 Hours        Results for orders placed or performed during the hospital encounter of 10/29/17 (from the past 48 hour(s))  Glucose, capillary     Status: Abnormal    Collection Time: 11/01/17  8:10 AM  Result Value Ref Range    Glucose-Capillary 224 (H) 65 - 99 mg/dL    Comment 1 Notify RN      Comment 2 Document in Chart    Sodium     Status: Abnormal    Collection Time: 11/01/17 11:36 AM  Result Value Ref Range    Sodium 151 (H) 135 - 145 mmol/L      Comment: Performed at [facility] [address], [place], [place] [address]  Glucose, capillary     Status: Abnormal    Collection Time: 11/01/17 12:43 PM  Result Value Ref Range    Glucose-Capillary 247 (H) 65 - 99 mg/dL    Comment 1 Notify RN      Comment 2 Document in Chart    Glucose, capillary     Status: Abnormal    Collection Time: 11/01/17  4:42 PM  Result Value Ref Range  Glucose-Capillary 202 (H) 65 - 99 mg/dL    Comment 1 Notify RN      Comment 2 Document in Chart    Sodium     Status: Abnormal    Collection Time: 11/01/17  5:11 PM  Result Value Ref Range    Sodium 152 (H) 135 - 145 mmol/L      Comment: Performed at [facility] [address], [place], [place] [address]  Glucose, capillary     Status: Abnormal    Collection Time: 11/01/17  9:42 PM  Result Value Ref Range    Glucose-Capillary 211 (H) 65 - 99 mg/dL  Sodium     Status: Abnormal    Collection Time: 11/01/17 11:20 PM  Result Value Ref Range    Sodium 158 (H) 135 - 145 mmol/L      Comment: Performed at [facility] [address], [place], [place] [address]  Basic metabolic panel     Status: Abnormal    Collection Time: 11/02/17  5:30 AM  Result Value Ref Range    Sodium 149 (H) 135 - 145 mmol/L    Potassium 3.8 3.5 - 5.1 mmol/L    Chloride 116 (H) 101 - 111 mmol/L    CO2 25 22 - 32 mmol/L    Glucose, Bld 182 (H) 65 - 99 mg/dL    BUN 23 (H) 6 - 20 mg/dL    Creatinine, Ser 1.21 (H) 0.44 - 1.00 mg/dL    Calcium 7.9 (L) 8.9 - 10.3 mg/dL    GFR calc non Af Amer 40 (L) >60 mL/min    GFR calc Af Amer 47 (L) >60 mL/min      Comment: (NOTE) The eGFR has been calculated using the CKD EPI equation. This calculation has not been validated in all clinical situations. eGFR's persistently <60 mL/min signify possible Chronic Kidney Disease.      Anion gap 8 5 - 15      Comment: Performed at [facility] [address], [place], [place] [address]  CBC     Status: Abnormal    Collection Time: 11/02/17  5:30 AM  Result Value Ref Range     WBC 18.4 (H) 4.0 - 10.5 K/uL    RBC 4.02 3.87 - 5.11 MIL/uL    Hemoglobin 10.6 (L) 12.0 - 15.0 g/dL    HCT 36.3 36.0 - 46.0 %    MCV 90.3 78.0 - 100.0 fL    MCH 26.4 26.0 - 34.0 pg    MCHC 29.2 (L) 30.0 - 36.0 g/dL    RDW 19.0 (H) 11.5 - 15.5 %    Platelets 249 150 - 400 K/uL      Comment: Performed at [facility], [place] [address], [place], [place] [address]  Magnesium     Status: Abnormal    Collection Time: 11/02/17  5:30 AM  Result Value Ref Range    Magnesium 1.5 (L) 1.7 - 2.4 mg/dL      Comment: Performed at [facility] [address], [place], [place] [address]  Glucose, capillary     Status: Abnormal    Collection Time: 11/02/17  7:47 AM  Result Value Ref Range    Glucose-Capillary 144 (H) 65 - 99 mg/dL    Comment 1 Notify RN      Comment 2 Document in Chart    Sodium     Status: Abnormal    Collection Time: 11/02/17 11:31 AM  Result Value Ref Range    Sodium 146 (H) 135 - 145 mmol/L      Comment: Performed at [facility] [address], [place], [place] [address]  Glucose, capillary     Status: Abnormal    Collection Time: 11/02/17 11:58 AM  Result Value Ref Range    Glucose-Capillary 203 (H) 65 - 99 mg/dL    Comment 1 Notify RN      Comment 2 Document in Chart    Glucose, capillary     Status: Abnormal    Collection Time: 11/02/17  5:13 PM  Result Value Ref Range    Glucose-Capillary 161 (H) 65 - 99 mg/dL  Sodium     Status: None    Collection Time: 11/02/17  5:38 PM  Result Value Ref Range    Sodium 143 135 - 145 mmol/L      Comment: Performed at [facility], [place] [address], [place], [place] [address]  Urinalysis, Routine w reflex microscopic     Status: None    Collection Time: 11/02/17  5:49 PM  Result Value Ref Range    Color, Urine YELLOW YELLOW    APPearance CLEAR CLEAR    Specific Gravity, Urine 1.009 1.005 - 1.030    pH 5.0 5.0 - 8.0    Glucose, UA NEGATIVE NEGATIVE mg/dL    Hgb urine dipstick NEGATIVE NEGATIVE     Bilirubin Urine NEGATIVE NEGATIVE    Ketones, ur NEGATIVE NEGATIVE mg/dL    Protein, ur NEGATIVE NEGATIVE mg/dL    Nitrite NEGATIVE NEGATIVE    Leukocytes, UA NEGATIVE NEGATIVE      Comment: Performed at [facility] [address], [place], [place] [address]  Glucose, capillary     Status: Abnormal    Collection Time: 11/02/17  9:13 PM  Result Value Ref Range    Glucose-Capillary 170 (H) 65 - 99 mg/dL  Sodium     Status: None    Collection Time: 11/02/17 11:10 PM  Result Value Ref Range    Sodium 144 135 - 145 mmol/L      Comment: Performed at [facility], [place] [address], [place], [place] [address]  Basic metabolic panel     Status: Abnormal    Collection Time: 11/03/17  4:40 AM  Result Value Ref Range    Sodium 145 135 - 145 mmol/L    Potassium 3.3 (L) 3.5 - 5.1 mmol/L    Chloride 111 101 - 111 mmol/L    CO2 24 22 - 32 mmol/L    Glucose, Bld 155 (H) 65 - 99 mg/dL    BUN 27 (H) 6 - 20 mg/dL    Creatinine, Ser 1.20 (H) 0.44 - 1.00 mg/dL    Calcium 8.1 (L) 8.9 - 10.3 mg/dL    GFR calc non Af Amer 41 (L) >60 mL/min    GFR calc Af Amer 47 (L) >60 mL/min      Comment: (NOTE) The eGFR has been calculated using the CKD EPI equation. This calculation has not been validated in all clinical situations. eGFR's persistently <60 mL/min signify possible Chronic Kidney Disease.      Anion gap 10 5 - 15      Comment: Performed at [facility] [address], [place], [place] [address]  CBC     Status: Abnormal    Collection Time: 11/03/17  4:40 AM  Result Value Ref Range    WBC 15.7 (H) 4.0 -  10.5 K/uL    RBC 3.85 (L) 3.87 - 5.11 MIL/uL    Hemoglobin 10.1 (L) 12.0 - 15.0 g/dL    HCT 34.4 (L) 36.0 - 46.0 %    MCV 89.4 78.0 - 100.0 fL    MCH 26.2 26.0 - 34.0 pg    MCHC 29.4 (L) 30.0 - 36.0 g/dL    RDW 18.6 (H) 11.5 - 15.5 %    Platelets 229 150 - 400 K/uL      Comment: Performed at [facility] [address], [place], [place] [address]       Imaging  Results (Last 48 hours)  Ct Head Wo Contrast   Result Date: 11/03/2017 CLINICAL DATA:  84 y/o  F; intracranial hemorrhage for follow-up. EXAM: CT HEAD WITHOUT CONTRAST TECHNIQUE: Contiguous axial images were obtained from the base of the skull through the vertex without intravenous contrast. COMPARISON:  10/29/2017 and 10/31/2017 CT head. 11/01/2017 MRI head. FINDINGS: Brain: Stable acute hemorrhage within the right cerebellum measuring up to 27 mm as well as associated edema and mass effect. No evidence for interval acute stroke, hemorrhage, or focal mass effect of the brain. Stable chronic microvascular ischemic changes and parenchymal volume loss. Stable small chronic lacunar infarcts within the bilateral basal ganglia. Vascular: Mild calcific atherosclerosis of carotid siphons. No hyperdense vessel identified. Skull: Normal. Negative for fracture or focal lesion. Sinuses/Orbits: Partial opacification of right-sided ethmoid air cells. Opacification of left mastoid tip. Bilateral intra-ocular lens replacement. Other: None. IMPRESSION: 1. Stable small hemorrhage within the right cerebellar hemisphere. Stable mild associated edema and local mass effect. 2. No new acute intracranial abnormality. 3. Stable chronic microvascular ischemic changes and parenchymal volume loss of the brain. Electronically Signed   By: Kristine Garbe M.D.   On: 11/03/2017 01:26    Mr Brain Wo Contrast   Result Date: 11/01/2017 CLINICAL DATA:  Cerebellar hemorrhage. EXAM: MRI HEAD WITHOUT CONTRAST TECHNIQUE: Multiplanar, multiecho pulse sequences of the brain and surrounding structures were obtained without intravenous contrast. COMPARISON:  10/31/2017 FINDINGS: Brain: 2.9 x 1.5 cm hematoma in the right cerebellum has not significantly changed in size and again is noted to extend into the fourth ventricle, and there are trace blood products in the occipital horns of both lateral ventricles. Mild surrounding edema in the  right cerebellar hemisphere is likely unchanged. The fourth ventricle remains patent, and there is no evidence of obstructive hydrocephalus. No acute infarct is identified separate from the hematoma, and no underlying mass is evident on this unenhanced study. Patchy to confluent T2 hyperintensities throughout the cerebral white matter nonspecific but compatible with moderate chronic small vessel ischemic disease. There is mild-to-moderate cerebral atrophy. There is no midline shift or extra-axial fluid collection. Vascular: Major intracranial vascular flow voids are preserved. Skull and upper cervical spine: Unremarkable bone marrow signal. Sinuses/Orbits: Bilateral cataract extraction. Clear paranasal sinuses. Small right and moderate left mastoid effusions. Other: None. IMPRESSION: 1. Unchanged right cerebellar hematoma with mild intraventricular extension. No hydrocephalus. 2. No acute infarct separate from the hematoma. 3. Moderate chronic small vessel ischemic disease. Electronically Signed   By: Logan Bores M.D.   On: 11/01/2017 10:30             Medical Problem List and Plan: 1.  Altered mental status with lethargy secondary to right cerebellar parenchymal hemorrhage felt to be secondary to hypertensive crisis 2.  DVT Prophylaxis/Anticoagulation: Subcutaneous Lovenox 30 mg daily initiated 11/01/2017 3. Pain Management: Tylenol as needed 4. Mood: Provide emotional  support 5. Neuropsych: This patient is capable of making decisions on her own behalf. 6. Skin/Wound Care: Routine skin checks 7. Fluids/Electrolytes/Nutrition: Routine in and outs with follow-up chemistries 8.  Acute anteroseptal wall MI with multivessel calcific three-vessel disease.  Not a candidate for CABG. Patient cleared to begin aspirin 81 mg daily 11/05/2017.  Plan for PCI as an outpatient follow-up 9.  Decompensated systolic congestive heart failure.  Monitor for any signs of fluid overload.  Lasix 40 mg  daily 10.   Hypertension.  Coreg 12.5 mg twice daily, Altace 10 mg twice daily, Aldactone 25 mg daily, Nitro-Dur patch 0.2 mg daily 11.  Diabetes mellitus with peripheral neuropathy.  Levemir 6 units nightly.  Check blood sugars before meals and at bedtime 12.  History of breast, colon, endometrial and cervical cancer.  Continue hydroxyurea    Post Admission Physician Evaluation: 1. Functional deficits secondary  to Right cerebellar hemorrhage. 2. Patient admitted to receive collaborative, interdisciplinary care between the physiatrist, rehab nursing staff, and therapy team. 3. Patient's level of medical complexity and substantial therapy needs in context of that medical necessity cannot be provided at a lesser intensity of care. 4. Patient has experienced substantial functional loss from his/her baseline. Upon functional assessment at the time of the preadmission screening, patient was min assist upper body max assist lower body ADLs, min assist 80 feet ambulation.  Upon most recent functional evaluation, patient was min assist upper extremity ADLs max assist lower extremity ADLs, moderate assistance with transfers max assist with toileting hygiene.  Judging by the patient's diagnosis, physical exam, and functional history, the patient has potential for functional progress which will result in measurable gains while on inpatient rehab.  These gains will be of substantial and practical use upon discharge in facilitating mobility and self-care at the household level. 5. Physiatrist will provide 24 hour management of medical needs as well as oversight of the therapy plan/treatment and provide guidance as appropriate regarding the interaction of the two. 6. 24 hour rehab nursing will assist in the management of  bladder management, bowel management, safety, skin/wound care, disease management, medication administration, pain management and patient education  and help integrate therapy concepts, techniques,education,  etc. 7. PT will assess and treat for:   pre gait, gait training, endurance , safety, equipment, neuromuscular re education.  Goals are: supervision. 8. OT will assess and treat for  .  Goals are: supervision.  9. SLP will assess and treat for  .  Goals are: independent. 10. Case Management and Social Worker will assess and treat for psychological issues and discharge planning. 11. Team conference will be held weekly to assess progress toward goals and to determine barriers to discharge. 12.  Patient will receive at least 3 hours of therapy per day at least 5 days per week. 13. ELOS and Prognosis: 7-12d good   "I have personally performed a face to face diagnostic evaluation of this patient.  Additionally, I have reviewed and concur with the physician assistant's documentation above." Charlett Blake M.D. [facility] FAAPM&R (Sports Med, Neuromuscular Med) Diplomate Am Board of Electrodiagnostic Med    Elizabeth Sauer 11/03/2017

## 2017-11-04 NOTE — Discharge Summary (Addendum)
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 [facility]) Active Problems:   Cancer of right colon ([place])   Hypoxia   IVH (intraventricular hemorrhage) ([place])   Acute respiratory failure with hypoxia ([place])   History of cervical cancer   History of TIA (transient ischemic attack)   Acute systolic congestive heart failure ([place])   Reactive hypertension   Hypernatremia   Leukocytosis   Acute blood loss anemia   Elevated serum creatinine   Essential hypertension   Hyperlipidemia   Diabetes ([place])   CAD (coronary artery disease)   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 Adenocarcinoma in situ of cervix   . Arthritis    hands  . Breast cancer ([place])   . Colon cancer ([place])   . Diabetes mellitus without complication ([place])   . Endometrial carcinoma (H[facility])    s/p total abdominal hysterectomy  . H/O compression fracture of spine 2014   thoracic spine  . H/O polycythemia vera   . H/O TIA (transient ischemic attack) and stroke 09/2014, 03/2015   No deficits  . Hypertension   . Hyperuricemia   . Microalbuminuria   . Polycythemia vera ([place])   . Recurrent falls   . Skin cancer    face, legs  . Stroke [facility]) 2008   no deficits  . Trochanteric bursitis   . Varicose veins    treated   Past Surgical History:  Procedure Laterality Date  . ABDOMINAL HYSTERECTOMY    . BOWEL RESECTION N/A 03/28/2015   Procedure: SMALL BOWEL RESECTION;  Surgeon: Leonie Green, MD;  Location: [facility] ORS;  Service: General;  Laterality: N/A;  . CATARACT EXTRACTION W/ INTRAOCULAR LENS IMPLANT Right   . CATARACT EXTRACTION W/PHACO Left 10/07/2015   Procedure: CATARACT EXTRACTION PHACO AND INTRAOCULAR LENS PLACEMENT (IOC);  Surgeon: Ronnell Freshwater, MD;  Location: [place];  Service: Ophthalmology;  Laterality: Left;  DIABETIC - oral meds VISION BLUE  . CORONARY ANGIOGRAPHY N/A 10/29/2017   Procedure: CORONARY ANGIOGRAPHY;  Surgeon: Dionisio David, MD;  Location: [place] CV LAB;  Service: Cardiovascular;  Laterality: N/A;  . EXPLORATORY LAPAROTOMY     for fibroids  . LEFT HEART CATH Right 10/29/2017   Procedure: Left Heart Cath;  Surgeon: Dionisio David, MD;  Location: [place] CV LAB;  Service: Cardiovascular;  Laterality: Right;  . TONSILLECTOMY      Medications to be continued on Rehab Allergies as of 11/04/2017      Reactions   Simvastatin    Other reaction(s): Muscle Pain      Medication List    STOP taking these medications   alendronate 70 MG tablet Commonly known as:  FOSAMAX   chlorthalidone 25 MG tablet Commonly known as:  HYGROTON   clopidogrel 75 MG tablet Commonly known as:  PLAVIX   metFORMIN 1000 MG tablet Commonly known as:  GLUCOPHAGE   pioglitazone 15 MG tablet Commonly known as:  ACTOS   verapamil 240  MG CR tablet Commonly known as:  CALAN-SR     TAKE these medications   calcium-vitamin D 500-200 MG-UNIT tablet Commonly known as:  OSCAL WITH D Take 1 tablet by mouth daily.   carvedilol 12.5 MG tablet Commonly known as:  COREG Take 1 tablet (12.5 mg total) by mouth 2 (two) times daily with a meal. What changed:    medication strength  how much to take   docusate sodium 250 MG capsule Commonly known as:  COLACE Take 250 mg by mouth daily as needed for constipation.   enoxaparin 30 MG/0.3ML injection Commonly known as:  LOVENOX Inject 0.3 mLs (30 mg total) into the skin  daily. Start taking on:  11/05/2017   feeding supplement (GLUCERNA SHAKE) Liqd Take 237 mLs by mouth 3 (three) times daily between meals.   fexofenadine 180 MG tablet Commonly known as:  ALLEGRA Take 180 mg by mouth daily as needed.   furosemide 40 MG tablet Commonly known as:  LASIX Take 1 tablet (40 mg total) by mouth daily. Start taking on:  11/05/2017   hydroxyurea 500 MG capsule Commonly known as:  HYDREA Take 1 capsule (500 mg total) by mouth daily. TAKE 1 CAPSULE BY MOUTH ON Sunday through Friday.  Do no take on Saturday What changed:    how much to take  how to take this  when to take this  additional instructions   insulin detemir 100 UNIT/ML injection Commonly known as:  LEVEMIR Inject 0.06 mLs (6 Units total) into the skin at bedtime.   loperamide 2 MG capsule Commonly known as:  IMODIUM Take 4 mg by mouth as needed for diarrhea or loose stools.   mouth rinse Liqd solution 15 mLs by Mouth Rinse route 2 (two) times daily.   nitroGLYCERIN 0.2 mg/hr patch Commonly known as:  NITRODUR - Dosed in mg/24 hr Place 1 patch (0.2 mg total) onto the skin daily. Start taking on:  11/05/2017   polyethylene glycol packet Commonly known as:  MIRALAX / GLYCOLAX Take 17 g by mouth daily as needed.   potassium chloride SA 20 MEQ tablet Commonly known as:  K-DUR,KLOR-CON Take 1 tablet (20 mEq total) by mouth daily. Start taking on:  11/05/2017 What changed:    medication strength  how much to take   pravastatin 20 MG tablet Commonly known as:  PRAVACHOL Take 20 mg by mouth at bedtime.   PROBIOTIC DAILY PO Take 1 tablet by mouth daily.   ramipril 10 MG capsule Commonly known as:  ALTACE Take 10 mg by mouth 2 (two) times daily.   spironolactone 25 MG tablet Commonly known as:  ALDACTONE Take 1 tablet (25 mg total) by mouth daily. Start taking on:  11/05/2017       LABORATORY STUDIES CBC    Component Value Date/Time   WBC 15.7 (H) 11/03/2017 0440   RBC  3.85 (L) 11/03/2017 0440   HGB 10.1 (L) 11/03/2017 0440   HGB 12.2 10/15/2014 1250   HCT 34.4 (L) 11/03/2017 0440   HCT 39.4 10/15/2014 1250   PLT 229 11/03/2017 0440   PLT 476 (H) 10/15/2014 1250   MCV 89.4 11/03/2017 0440   MCV 76 (L) 10/15/2014 1250   MCH 26.2 11/03/2017 0440   MCHC 29.4 (L) 11/03/2017 0440   RDW 18.6 (H) 11/03/2017 0440   RDW 17.7 (H) 10/15/2014 1250   LYMPHSABS 2.3 10/28/2017 1504   LYMPHSABS 1.5 10/15/2014 1250   MONOABS 0.8 10/28/2017 1504   MONOABS 0.7 10/15/2014 1250  EOSABS 0.3 10/28/2017 1504   EOSABS 0.4 10/15/2014 1250   BASOSABS 1.0 (H) 10/28/2017 1504   BASOSABS 0.3 (H) 10/15/2014 1250   CMP    Component Value Date/Time   NA 143 11/04/2017 0500   NA 138 02/14/2014 1409   K 3.2 (L) 11/04/2017 0500   K 4.3 01/17/2014 1514   CL 107 11/04/2017 0500   CL 99 01/17/2014 1514   CO2 26 11/04/2017 0500   CO2 27 01/17/2014 1514   GLUCOSE 166 (H) 11/04/2017 0500   GLUCOSE 164 (H) 01/17/2014 1514   BUN 26 (H) 11/04/2017 0500   BUN 25 (H) 01/17/2014 1514   CREATININE 1.29 (H) 11/04/2017 0500   CREATININE 1.26 01/17/2014 1514   CALCIUM 8.4 (L) 11/04/2017 0500   CALCIUM 9.5 01/17/2014 1514   PROT 7.5 10/28/2017 1504   PROT 6.8 11/10/2012 1221   ALBUMIN 4.3 10/28/2017 1504   ALBUMIN 3.5 11/10/2012 1221   AST 31 10/28/2017 1504   AST 19 11/10/2012 1221   ALT 17 10/28/2017 1504   ALT 17 11/10/2012 1221   ALKPHOS 48 10/28/2017 1504   ALKPHOS 54 11/10/2012 1221   BILITOT 0.8 10/28/2017 1504   BILITOT 0.4 11/10/2012 1221   GFRNONAA 37 (L) 11/04/2017 0500   GFRNONAA 40 (L) 01/17/2014 1514   GFRAA 43 (L) 11/04/2017 0500   GFRAA 47 (L) 01/17/2014 1514   COAGS Lab Results  Component Value Date   INR 1.06 10/29/2017   INR 1.06 12/20/2014   INR 1.0 11/10/2012   Lipid Panel    Component Value Date/Time   CHOL 148 12/20/2014 1533   TRIG 130 12/20/2014 1533   HDL 44 12/20/2014 1533   CHOLHDL 3.4 12/20/2014 1533   VLDL 26 12/20/2014 1533    LDLCALC 78 12/20/2014 1533   HgbA1C  Lab Results  Component Value Date   HGBA1C 7.0 (H) 03/29/2015   Urinalysis    Component Value Date/Time   COLORURINE YELLOW 11/02/2017 1749   APPEARANCEUR CLEAR 11/02/2017 1749   APPEARANCEUR Clear 11/10/2012 1347   LABSPEC 1.009 11/02/2017 1749   LABSPEC 1.012 11/10/2012 1347   PHURINE 5.0 11/02/2017 1749   GLUCOSEU NEGATIVE 11/02/2017 1749   GLUCOSEU Negative 11/10/2012 1347   HGBUR NEGATIVE 11/02/2017 1749   BILIRUBINUR NEGATIVE 11/02/2017 1749   BILIRUBINUR Negative 11/10/2012 [place] 11/02/2017 1749   PROTEINUR NEGATIVE 11/02/2017 1749   NITRITE NEGATIVE 11/02/2017 1749   LEUKOCYTESUR NEGATIVE 11/02/2017 1749   LEUKOCYTESUR 1+ 11/10/2012 1347   Urine Drug Screen     Component Value Date/Time   LABOPIA NONE DETECTED 12/20/2014 1639   COCAINSCRNUR NONE DETECTED 12/20/2014 1639   LABBENZ NONE DETECTED 12/20/2014 1639   AMPHETMU NONE DETECTED 12/20/2014 1639   THCU NONE DETECTED 12/20/2014 1639   LABBARB NONE DETECTED 12/20/2014 1639    Alcohol Level    Component Value Date/Time   ETH <5 12/20/2014 1533     SIGNIFICANT DIAGNOSTIC STUDIES Ct Head Wo Contrast 10/29/2017 Stable RIGHT cerebellar hemorrhage, with extension into the IV ventricle without hydrocephalus.   Ct Head Wo Contrast 10/29/2017 RIGHT cerebellar parenchymal hemorrhage approximately 31 x 16 x 19 mm. There is extension of hemorrhage into the fourth ventricle, without hydrocephalus. A hypertensive bleed is most likely, but unrecognized trauma, anticoagulation, vascular malformation, hemorrhagic metastatic neoplasm, are all considerations.   Ct Head Wo Contrast 10/31/2017 Stable RIGHT cerebellar parenchymal hemorrhage, IVH with no hydrocephalus   Ct Head Wo Contrast 11/03/2017 1. Stable small hemorrhage within the right  cerebellar hemisphere. Stable mild associated edema and local mass effect. 2. No new acute intracranial abnormality. 3.  Stable chronic microvascular ischemic changes and parenchymal volume loss of the brain.  MRI head 11/01/2017 1. Unchanged right cerebellar hematoma with mild intraventricular extension. No hydrocephalus. 2. No acute infarct separate from the hematoma. 3. Moderate chronic small vessel ischemic disease.  Transthoracic Echocardiogram  10/29/2017 - Left ventricle: The cavity size was severely dilated. Systolicfunction was severely reduced. The estimated ejection fractionwas 25%. Akinesis of the anteroseptal myocardium. Akinesis of theanterior myocardium. - Aortic valve: There was trivial regurgitation. Valve area (VTI):1.68 cm^2. Valve area (Vmax): 1.9 cm^2. Valve area (Vmean): 1.81cm^2. - Mitral valve: There was mild regurgitation. Valve area bycontinuity equation (using LVOT flow): 2.19 cm^2. - Left atrium: The atrium was mildly dilated. - Right ventricle: The cavity size was mildly dilated. - Right atrium: The atrium was mildly dilated. - Pericardium, extracardiac: A trivial pericardial effusion wasidentified posterior to the heart. Features were not consistentwith tamponade physiology. There was a left pleural effusion. - 4 Chamber dilatation with severe LV systolic dysfunction withanteroapical akinesis sugestive ASWMI, and no thrombii present inLV. Fibrocalcified aortic valve without AS.  Dg Chest 1 View 10/28/2017 1. Cardiomegaly with new perihilar opacities, presumably pulmonary edema, suggesting CHF/volume overload. Pneumonia is considered less likely.  2. Aortic atherosclerosis.   Ct Angio Chest Pe W And/or Wo Contrast 10/28/2017 1. Pericardial effusion and cardiomegaly.  Coronary artery disease.  2. Parenchymal changes consistent with pulmonary edema and small pleural effusions.  3. 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 Recommend annual imaging followup by CTA or MRA. This recommendation follows 2010  ACCF/AHA/AATS/ACR/ASA/SCA/SCAI/SIR/STS/SVM Guidelines for the Diagnosis and Management of Patients with Thoracic Aortic Disease. Circulation.2010; 121: M841-L244  4. Aortic aneurysm NOS (ICD10-I71.9). Aortic Atherosclerosis (ICD10-I70.0).  5. Stable probable scar at the LEFT lung apex. 6. Numerous thoracic compression fractures.   Dg Chest Port 1 View 10/30/2017 Cardiomegaly with findings of CHF and small bilateral pleural effusions. No pneumothorax.   Left Heart Cath - Neoma Laming MD [facility] 10/29/2017  Ost Cx to Prox Cx lesion is 80% stenosed with 90% stenosed side branch in Ost 1st Mrg to 1st Mrg.  Mid LAD lesion is 95% stenosed.  Mid RCA lesion is 75% stenosed.  Ost LAD to Prox LAD lesion is 70% stenosed. Severe 3 vessel disease with severe LV systolic dysfunction and anteroapical aneurysm. Advise CABG at [facility]   HISTORY OF PRESENT ILLNESS Maddison Kilner Toneyis an 84 y.o.femalewith a PMH of Stroke, Recurrent Falls, Polycythemia Vera, Microalbuminuria, HTN, TIA, Endometrial Carcinoma, Diabetes Mellitus, Colon Cancer, Breast Cancer, Arthritis, and Adenocarcinoma in Situ of [facility] and unresponsive 2/2 hypoxia to [facility] 10/28/17. CTA Chest revealed pericardial effusion, small pleural effusions, and pulmonary edema. She was treated for sepsis secondary to pneumonia. She is on ASA and Plavix. She was noted to have NSTEMI and underwent cardiac cath which showed 3 vessel disease and severe LV dysfunction. Following cath procedure patient had a head CT which showed right cerebellar hemorrhage with IVH. She was supposed to receive heparin drip, but was held. Patient transferred to [facility] hospital for further management.   HOSPITAL COURSE Ms. TERRELL SHIMKO is a 84 y.o. female with history of previous stroke, recurrent falls, Polycythemia Vera, Microalbuminuria, HTN, TIA, Endometrial Carcinoma, Diabetes Mellitus, Colon Cancer, Breast Cancer,  Arthritis, Adenocarcinoma in Situ of Cervix, severe three-vessel coronary artery disease, ejection fraction 25%, and  congestive heart failurepresented to [facility] with black emesis and unresponsive 2/2 hypoxia.  CTA chest revealed pericardial effusion and she was treated for sepsis secondary to pneumonia.  She was noted to have had a NSTEMI and underwent cardiac cath which showed three-vessel disease and severe LV dysfunction.  CABG recommended, but felt to be too high risk.  PCI stenting once CHF stable.  Post cardiac cath, CT of head showed a RIGHT cerebellar parenchymal hemorrhage, pt transferred [facility].  Hemorrhage was felt to be secondary to hypertensive etiology on dual antiplatelet therapy.  Neuro wise, she was at risk for cerebral edema and treated with 3%.  There was no neurosurgery intervention that would benefit her.  She continued to progress though still had issues with pulmonary edema and CHF.  Patient managed in the ICU with cardiology help, diuresed.  Developed leukocytosis, etiology unknown, now resolving.  Once stable, was transferred to the inpatient rehab for ongoing therapy.  Plans are for PCI with stent in the near future.  Cardiology to follow-up in 2 weeks.  Okay to resume aspirin at time of discharge from inpatient (7 days) and resume Plavix in 3 weeks.  Stroke:  RIGHT cerebellar parenchymal hemorrhage w/ IVH, etiology hypertensive plus dual antiplatelet therapy  CT head - RIGHT cerebellar parenchymal hemorrhage approximately 31 x 16 x 19 mm extension of hemorrhage into the fourth ventricle, stable on a follow up [facility] 5/12 stable ICH/IVH no Hydrocephalus    MRI head - stable R cerebellar hmg, no hydro, Small vessel disease.   At risk for hydrocephalus, treated with hyperosmolar therapy 3% nacl.   Neurosurgery consult appreciated, no surgical intervention  Repeat CT head 5/15 stable  Carotid Doppler - not indicated  2D Echo - as above EF 25%  LDL - 78  HgbA1c  - 7.0  clopidogrel 75 mg daily prior to admission. Plan to resume aspirin 81 at 1 week and plavix 75 in 3 weeks (stopping aspirin at that time). Plan to start aspirin at time of d/c to CIR  Ongoing aggressive stroke risk factor management  Therapy recommendations:  CIR.   Disposition:  CIR   Hypertensive Emergency  BP elevated on arrival 174/113  Treated w/ prn labetalol  Home meds:  Coreg 6.25 bid, altace 10 mg bid, verapamil 240 CR daily  Now on: Coreg 12.5 bid, altace 10 mg bid  Long-term BP goal normotensive  Hyperlipidemia  Lipid lowering medication PTA:  Pravachol 20 mg daily  LDL 78, goal < 70  Not treated with statin in hospital   Resume Pravachol at discharge. (muscle aches with simvastatin)  Diabetes  HgbA1c 7.0, goal < 7.0  Outpatient Diabetes medications:Metformin 1000 mg bid, Actos 15 mg daily  On  levemir 6u q hs  CBGS stabilizing. Resume home meds as appropriate  Other Stroke Risk Factors  Advanced age  Former cigarette smoker  Hx stroke/TIA  Decompensated systolic CHF  tcardiology consulted  EF 25%  Treated w/ lasix  Wean O2 - goal 92%  CXR 5/15 - Worsening of pulmonary interstitial edema secondary to CHF. Possible left lower lobe atelectasis or pneumonia. Thoracic aortic atherosclerosis.  Dr. Terrence Dupont said change lasix to po day of d/c  Coronary artery disease - Severe 3 vessel CAD - NSTEMI  PCI only once able to tolerate dual antiplatelet medications and stable from neuro point of view as outpatient in 3-4 weeks. ([facility])  Plan resume aspirin in 1 week and plavix 75 mg in 3 weeks  Nitro patch  added   Leukocytosis, improving, etiology unclear  WBC 12.9->18.4 -> 15.7 (was 26.1)  Afebrile  Blood cx neg 12/9  UA normal  CXR  5/15 - Worsening of pulmonary interstitial edema secondary to CHF. Possible left lower lobe atelectasis or pneumonia. Thoracic aortic atherosclerosis.  Other Active Problems/plan   BUN 26  ; creatinine 1.29 stable  Hematemesis - GI consulted at [facility].On Protonix. 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 On hydroxea. resumed.  Hypokalemia 3.2, replacement underway   Acute blood loss anemia   DISCHARGE EXAM Blood pressure 114/72, pulse 73, temperature (!) 97.1 F (36.2 C), temperature source Axillary, resp. rate (!) 21, weight 56.7 kg (125 lb), SpO2 93 %. General -well developed elderly female in no acute distress Heart - Regular rate and rhythm - no murmer appreciated Lungs - Clear to auscultation anteriorly Extremities - Distal pulses intact - no edema Skin - Warm and dry, R forearm IV infiltrate, normal temperature, small area, mild erythema   Mental Status: awake, alert oriented to self and place and time, follows commands, mild dysarthria. No aphasia. Pupils reactive B/L, EOMI -  Dysmetric saccadic movement to the R, visual field intact, speech minimally dysarthric, face symmetric, motor 5/5 all group, sensory intact, slow finger to nose B/L  Discharge Diet dysphagia 3 thin liquids  DISCHARGE PLAN  Disposition:  Transfer to [facility] for ongoing PT, OT and ST  aspirin 81 mg daily for secondary stroke prevention to start tomorrow 11/05/2017 (at 1 week) and then resume Plavix 75 mg at 3 weeks.  Recommend ongoing risk factor control by Primary Care Physician at time of discharge from inpatient rehabilitation.  Follow-up Harwani in 2 weeks  Follow-up Baxter Hire, MD in 2 weeks following discharge from rehab.  Follow-up in [facility] in 4 weeks following discharge from rehab, office to schedule an appointment.   50 minutes were spent preparing discharge.  Burnetta Sabin, MSN, APRN, ANVP-BC, AGPCNP-BC Advanced Practice Stroke Nurse [place] for Schedule & Pager information 11/04/2017 4:14 PM    I have personally examined this patient, reviewed notes, independently viewed imaging studies, participated in medical decision making and plan of care.ROS completed by me personally and pertinent positives fully documented  I have made any additions or clarifications directly to the above note. Agree with note above.   Antony Contras, MD Medical Director [facility] Pager: [phone] 11/05/2017 8:14 AM

## 2017-11-04 NOTE — Progress Notes (Addendum)
STROKE TEAM PROGRESS NOTE   SUBJECTIVE (INTERVAL HISTORY) Her friend Valentino Saxon is at the bedside. Inquisitive about her dx and care. Shared their hx together. Pt lying in bed, sleeping. Plans for CIR today.   OBJECTIVE Temp:  [97.5 F (36.4 C)-98.7 F (37.1 C)] 97.7 F (36.5 C) (05/16 0400) Pulse Rate:  [35-81] 70 (05/16 0800) Cardiac Rhythm: Normal sinus rhythm (05/16 0800) Resp:  [17-28] 18 (05/16 0800) BP: (105-144)/(59-91) 132/75 (05/16 0800) SpO2:  [93 %-98 %] 95 % (05/16 0800)  CBC:  Recent Labs  Lab 10/28/17 1504  11/02/17 0530 11/03/17 0440  WBC 26.1*   < > 18.4* 15.7*  NEUTROABS 21.4*  --   --   --   HGB 14.4   < > 10.6* 10.1*  HCT 45.9   < > 36.3 34.4*  MCV 86.1   < > 90.3 89.4  PLT 698*   < > 249 229   < > = values in this interval not displayed.    Basic Metabolic Panel:  Recent Labs  Lab 10/30/17 0249  11/02/17 0530  11/03/17 0440 11/03/17 1235 11/04/17 0500  NA 143   < > 149*   < > 145 144 143  K 2.8*   < > 3.8  --  3.3*  --  3.2*  CL 104   < > 116*  --  111  --  107  CO2 26   < > 25  --  24  --  26  GLUCOSE 229*   < > 182*  --  155*  --  166*  BUN 26*   < > 23*  --  27*  --  26*  CREATININE 1.21*   < > 1.21*  --  1.20*  --  1.29*  CALCIUM 8.3*   < > 7.9*  --  8.1*  --  8.4*  MG 1.3*   < > 1.5*  --   --  1.5*  --   PHOS 3.4  --   --   --   --   --   --    < > = values in this interval not displayed.    IMAGING Ct Head Wo Contrast 10/29/2017 Stable RIGHT cerebellar hemorrhage, with extension into the IV ventricle without hydrocephalus.   Ct Head Wo Contrast 10/29/2017 RIGHT cerebellar parenchymal hemorrhage approximately 31 x 16 x 19 mm. There is extension of hemorrhage into the fourth ventricle, without hydrocephalus. A hypertensive bleed is most likely, but unrecognized trauma, anticoagulation, vascular malformation, hemorrhagic metastatic neoplasm, are all considerations.   Ct Head Wo Contrast 10/31/2017 Stable RIGHT cerebellar  parenchymal hemorrhage, IVH with no hydrocephalus   Ct Head Wo Contrast 11/03/2017 1. Stable small hemorrhage within the right cerebellar hemisphere. Stable mild associated edema and local mass effect. 2. No new acute intracranial abnormality. 3. Stable chronic microvascular ischemic changes and parenchymal volume loss of the brain.  MRI head 11/01/2017 1. Unchanged right cerebellar hematoma with mild intraventricular extension. No hydrocephalus. 2. No acute infarct separate from the hematoma. 3. Moderate chronic small vessel ischemic disease.  Transthoracic Echocardiogram  10/29/2017 - Left ventricle: The cavity size was severely dilated. Systolic function was severely reduced. The estimated ejection fraction was 25%. Akinesis of the anteroseptal myocardium. Akinesis of the anterior myocardium. - Aortic valve: There was trivial regurgitation. Valve area (VTI): 1.68 cm^2. Valve area (Vmax): 1.9 cm^2. Valve area (Vmean): 1.81 cm^2. - Mitral valve: There was mild regurgitation. Valve area by continuity equation (using LVOT flow):  2.19 cm^2. - Left atrium: The atrium was mildly dilated. - Right ventricle: The cavity size was mildly dilated. - Right atrium: The atrium was mildly dilated. - Pericardium, extracardiac: A trivial pericardial effusion was identified posterior to the heart. Features were not consistent with tamponade physiology. There was a left pleural effusion. - 4 Chamber dilatation with severe LV systolic dysfunction with anteroapical akinesis sugestive ASWMI, and no thrombii present in LV. Fibrocalcified aortic valve without AS.  Dg Chest 1 View 10/28/2017 1. Cardiomegaly with new perihilar opacities, presumably pulmonary edema, suggesting CHF/volume overload. Pneumonia is considered less likely.  2. Aortic atherosclerosis.   Ct Angio Chest Pe W And/or Wo Contrast 10/28/2017 1. Pericardial effusion and cardiomegaly.  Coronary artery disease.  2. Parenchymal changes consistent  with pulmonary edema and small pleural effusions.  3. 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 Recommend annual imaging followup by CTA or MRA. This recommendation follows 2010 ACCF/AHA/AATS/ACR/ASA/SCA/SCAI/SIR/STS/SVM Guidelines for the Diagnosis and Management of Patients with Thoracic Aortic Disease. Circulation.2010; 121: O962-X528  4. Aortic aneurysm NOS (ICD10-I71.9). Aortic Atherosclerosis (ICD10-I70.0).  5. Stable probable scar at the LEFT lung apex. 6. Numerous thoracic compression fractures.   Dg Chest Port 1 View 10/30/2017 Cardiomegaly with findings of CHF and small bilateral pleural effusions. No pneumothorax.   Left Heart Cath - Neoma Laming MD [facility] 10/29/2017  Ost Cx to Prox Cx lesion is 80% stenosed with 90% stenosed side branch in Ost 1st Mrg to 1st Mrg.  Mid LAD lesion is 95% stenosed.  Mid RCA lesion is 75% stenosed.  Ost LAD to Prox LAD lesion is 70% stenosed. Severe 3 vessel disease with severe LV systolic dysfunction and anteroapical aneurysm. Advise CABG at [facility].   PHYSICAL EXAM per Madison Hickman -well developed elderly female in no acute distress Heart - Regular rate and rhythm - no murmer appreciated Lungs - Clear to auscultation anteriorly Extremities - Distal pulses intact - no edema Skin - Warm and dry, R forearm IV infiltrate, normal temperature, small area, mild erythema   Mental Status: awake, alert oriented to self and place and time, follows commands, mild dysarthria. No aphasia. Pupils reactive B/L, EOMI -  Dysmetric saccadic movement to the R, visual field intact, speech minimally dysarthric, face symmetric, motor 5/5 all group, sensory intact, slow finger to nose B/L   ASSESSMENT/PLAN Ms. SULLIVAN BLASING is a 84 y.o. female with history of previous stroke, recurrent falls, Polycythemia Vera, Microalbuminuria, HTN, TIA, Endometrial Carcinoma, Diabetes Mellitus, Colon  Cancer, Breast Cancer, Arthritis, Adenocarcinoma in Situ of Cervix, severe three-vessel coronary artery disease, ejection fraction 25%, and congestive heart failure presented to [facility] hospital with black emesis and unresponsive 2/2 hypoxia transferred to [facility] with a RIGHT cerebellar parenchymal hemorrhage.    Stroke:  RIGHT cerebellar parenchymal hemorrhage, etiology hypertensive plus dual antiplatelet therapy  CT head - RIGHT cerebellar parenchymal hemorrhage approximately 31 x 16 x 19 mm extension of hemorrhage into the fourth ventricle, stable on a follow up [facility] 5/12 stable ICH/IVH no Hydrocephalus    MRI head - stable R cerebellar hmg, no hydro, Small vessel disease.   At risk for hydrocephalus, started on hyperosmolar therapy 3% nacl. Na 145 this am. off 3% last night at 9p.   Neurosurgery consult appreciated, no surgical intervention  Repeat CT head 5/15 stable  Carotid Doppler - not indicated  2D Echo - as above EF 25%  LDL - 78  HgbA1c -  7.0  VTE prophylaxis - Lovenox 30 mg sq daily   clopidogrel 75 mg daily prior to admission. Plan to resume aspirin 81 at 1 week and plavix 75 in 3 weeks (stopping aspirin at that time). Plan to start aspirin at time of d/c to CIR  Ongoing aggressive stroke risk factor management  Therapy recommendations:  CIR. Admissions coordinator is following  Disposition:  Pending. Anticipate d/c to CIR today. Awaiting insurance approval.  Hypertensive Emergency  BP elevated on arrival 174/113  Treated w/ prn labetalol . Increase Systolic blood pressure goal less than 180  . Home meds:  Coreg 6.25 bid, altace 10 mg bid, verapamil 240 CR daily . Now on: Coreg 12.5 bid, altace 10 mg bid . Stable now . Long-term BP goal normotensive  Hyperlipidemia  Lipid lowering medication PTA:  Pravachol 20 mg daily  LDL 78, goal < 70  Current lipid lowering medication: none  Resume Pravachol at discharge. (muscle aches with  simvastatin)  Diabetes  HgbA1c 7.0, goal < 7.0  Outpatient Diabetes medications: Metformin 1000 mg bid, Actos 15 mg daily  On  levemir 6u q hs  CBGS - 156 this am  DB RN following.   Other Stroke Risk Factors  Advanced age  Former cigarette smoker - quit  Hx stroke/TIA  Decompensated systolic CHF  tx w/ lasix  EF 25%  Wean O2 - goal 92%  CXR 5/15 - Worsening of pulmonary interstitial edema secondary to CHF. Possible left lower lobe atelectasis or pneumonia. Thoracic aortic atherosclerosis.  Dr. Terrence Dupont said IV lasix x 1 more day, then change to po  Coronary artery disease - Severe 3 vessel CAD - NSTEMI  PCI  only once able to tolerate dual antiplatelet medications and stable from neuro point of view as outpatient in 3-4 weeks. [facility])  Plan resume aspirin in 1 week and plavix 75 mg in 3 weeks  Leukocytosis, improving  WBC 12.9->18.4 -> 15.7 (was 26.1)  Afebrile  Blood cx neg 12/9  UA normal  CXR  5/15 - Worsening of pulmonary interstitial edema secondary to CHF. Possible left lower lobe atelectasis or pneumonia. Thoracic aortic atherosclerosis.    Other Active Problems/plan   BUN 26 ; creatinine 1.29 stable  Hematemesis - GI consulted at [facility]. On Protonix. 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 On hydroxea. resumed.  Hypokalemia 3.2, replacement underway   Hospital day # [place], MSN, APRN, ANVP-BC, AGPCNP-BC Advanced Practice Stroke Nurse [place] for Schedule & Pager information 11/04/2017 8:25 AM  I have personally examined this patient, reviewed notes, independently viewed imaging studies, participated in medical decision making and plan of care.ROS completed by me personally and pertinent positives fully documented  I have made any additions or clarifications  directly to the above note. Agree with note above.  Transfer to inpatient rehabilitation when bed available.  Antony Contras, MD Medical Director [facility] Pager: [phone] 11/04/2017 1:49 PM    To contact Stroke Continuity provider, please refer to [email or web address] After hours, contact General Neurology

## 2017-11-04 NOTE — Progress Notes (Signed)
Patient arrived to 4W21 at approximately 1532. Patient received via bed accompanied by x 2 RN's and Niece. Upon arrival Patient is alert and oriented x 4 to person, place, time and situation. 02 @ 2 LPM via [place] in place Patient denies pain at current time.

## 2017-11-05 ENCOUNTER — Inpatient Hospital Stay (HOSPITAL_COMMUNITY): Payer: Medicare Other

## 2017-11-05 ENCOUNTER — Inpatient Hospital Stay (HOSPITAL_COMMUNITY): Payer: Medicare Other | Admitting: Speech Pathology

## 2017-11-05 DIAGNOSIS — Z8679 Personal history of other diseases of the circulatory system: Secondary | ICD-10-CM

## 2017-11-05 DIAGNOSIS — Z789 Other specified health status: Secondary | ICD-10-CM

## 2017-11-05 LAB — COMPREHENSIVE METABOLIC PANEL
ALT: 18 U/L (ref 14–54)
AST: 12 U/L — ABNORMAL LOW (ref 15–41)
Albumin: 2.6 g/dL — ABNORMAL LOW (ref 3.5–5.0)
Alkaline Phosphatase: 46 U/L (ref 38–126)
Anion gap: 12 (ref 5–15)
BUN: 25 mg/dL — ABNORMAL HIGH (ref 6–20)
CO2: 24 mmol/L (ref 22–32)
Calcium: 8.7 mg/dL — ABNORMAL LOW (ref 8.9–10.3)
Chloride: 105 mmol/L (ref 101–111)
Creatinine, Ser: 1.37 mg/dL — ABNORMAL HIGH (ref 0.44–1.00)
GFR calc Af Amer: 40 mL/min — ABNORMAL LOW (ref >60)
GFR calc non Af Amer: 35 mL/min — ABNORMAL LOW (ref >60)
Glucose, Bld: 128 mg/dL — ABNORMAL HIGH (ref 65–99)
Potassium: 3.4 mmol/L — ABNORMAL LOW (ref 3.5–5.1)
Sodium: 141 mmol/L (ref 135–145)
Total Bilirubin: 0.9 mg/dL (ref 0.3–1.2)
Total Protein: 5 g/dL — ABNORMAL LOW (ref 6.5–8.1)

## 2017-11-05 LAB — CBC WITH DIFFERENTIAL/PLATELET
Abs Immature Granulocytes: 0.2 10*3/uL — ABNORMAL HIGH (ref 0.0–0.1)
Basophils Absolute: 0.1 10*3/uL (ref 0.0–0.1)
Basophils Relative: 1 %
Eosinophils Absolute: 0.4 10*3/uL (ref 0.0–0.7)
Eosinophils Relative: 3 %
HCT: 36.1 % (ref 36.0–46.0)
Hemoglobin: 10.9 g/dL — ABNORMAL LOW (ref 12.0–15.0)
Immature Granulocytes: 1 %
Lymphocytes Relative: 6 %
Lymphs Abs: 1 10*3/uL (ref 0.7–4.0)
MCH: 26.2 pg (ref 26.0–34.0)
MCHC: 30.2 g/dL (ref 30.0–36.0)
MCV: 86.8 fL (ref 78.0–100.0)
Monocytes Absolute: 1.2 10*3/uL — ABNORMAL HIGH (ref 0.1–1.0)
Monocytes Relative: 8 %
Neutro Abs: 12.3 10*3/uL — ABNORMAL HIGH (ref 1.7–7.7)
Neutrophils Relative %: 81 %
Platelets: 261 10*3/uL (ref 150–400)
RBC: 4.16 MIL/uL (ref 3.87–5.11)
RDW: 18 % — ABNORMAL HIGH (ref 11.5–15.5)
WBC: 15.1 10*3/uL — ABNORMAL HIGH (ref 4.0–10.5)

## 2017-11-05 LAB — GLUCOSE, CAPILLARY
Glucose-Capillary: 127 mg/dL — ABNORMAL HIGH (ref 65–99)
Glucose-Capillary: 137 mg/dL — ABNORMAL HIGH (ref 65–99)
Glucose-Capillary: 150 mg/dL — ABNORMAL HIGH (ref 65–99)
Glucose-Capillary: 173 mg/dL — ABNORMAL HIGH (ref 65–99)

## 2017-11-05 MED ORDER — BISACODYL 10 MG RE SUPP
10.0000 mg | Freq: Once | RECTAL | Status: DC
  Filled 2017-11-05 (×3): qty 1

## 2017-11-05 NOTE — Care Management Note (Signed)
Inpatient [facility] Individual Statement of Services  Patient Name:  [name]  Date:  11/05/2017  Welcome to the [facility].  Our goal is to provide you with an individualized program based on your diagnosis and situation, designed to meet your specific needs.  With this comprehensive rehabilitation program, you will be expected to participate in at least 3 hours of rehabilitation therapies Monday-Friday, with modified therapy programming on the weekends.  Your rehabilitation program will include the following services:  Physical Therapy (PT), Occupational Therapy (OT), Speech Therapy (ST), 24 hour per day rehabilitation nursing, Case Management (Social Worker), Rehabilitation Medicine, Nutrition Services and Pharmacy Services  Weekly team conferences will be held on Wednesday to discuss your progress.  Your Social Worker will talk with you frequently to get your input and to update you on team discussions.  Team conferences with you and your family in attendance may also be held.  Expected length of stay: 12-14 days  Overall anticipated outcome: supervision with set-up  Depending on your progress and recovery, your program may change. Your Social Worker will coordinate services and will keep you informed of any changes. Your Social Worker's name and contact numbers are listed  below.  The following services may also be recommended but are not provided by the [facility] will be made to provide these services after discharge if needed.  Arrangements include referral to agencies that provide these services.  Your insurance has been verified to be:  [employer]-Medicare Your primary doctor is:  Harrel Lemon  Pertinent information will be shared with your doctor and your insurance company.  Social Worker:  Ovidio Kin, [place] or [phone]  Information discussed with and copy given to patient by: Elease Hashimoto, 11/05/2017, 11:44 AM

## 2017-11-05 NOTE — Progress Notes (Signed)
Physical Medicine and Rehabilitation Consult Reason for Consult: Decreased functional mobility Referring Physician: Dr. Leonie Man  HPI: [name] is a 84 y.o. right-handed female with history of adenosarcoma of cervix, endometrial carcinoma status post total abdominal hysterectomy, diabetes mellitus, polycythemia vera, TIA with recurrent falls maintained on Plavix.  Per chart review, and niece, patient lives alone.  One level home with 2 steps to entry.  Reportedly independent prior to admission and driving.  Question of family to assist on discharge.  Presented to [facility] 10/28/2017 with altered mental status, hematemesis and lethargy as well as difficulty breathing. Oxygen saturation in the 70s requiring nonrebreather mask.  WBC 26,000, troponin 1.02, lactic acid 3.1 CT angiogram of the chest showed pericardial effusion and cardiomegaly consistent with pulmonary edema.  CT the head reviewed, showing right cerebellar hemorrhage.  Per report, right cerebellar parenchymal hemorrhage approximately 31 x 16 x 19 mm.  There was extension of hemorrhage into the fourth ventricle without hydrocephalus.  Patient was ruled  in for non-Q wave myocardial infarction and underwent cardiac catheterization showing three-vessel disease and severe LV dysfunction.  CVTS consulted for possible need for CABG was not felt to be a good candidate for surgery due to multiple comorbidities.  Patient was transferred to [facility] for further evaluation.  Echocardiogram with ejection fraction of 25% as well as akinesis of the anterior myocardium.  Neurosurgery Dr. Sherley Bounds follow-up for cerebellar hemorrhage advise conservative care with follow-up cranial CT scan 10/31/2017 and MRI on 11/01/2017 stable.  Placed on intravenous Lasix for diuresis per cardiology services and awaiting further plan of care.  Physical and occupational therapy evaluations completed with recommendations of physical medicine rehab consult.  Review of  Systems  Constitutional: Negative for chills and fever.  HENT: Negative for hearing loss.   Eyes: Negative for blurred vision and double vision.  Respiratory: Positive for shortness of breath.   Cardiovascular: Positive for leg swelling. Negative for chest pain and palpitations.  Gastrointestinal: Positive for nausea and vomiting.  Genitourinary: Negative for hematuria.  Musculoskeletal: Positive for joint pain and myalgias.  Skin: Negative for rash.  All other systems reviewed and are negative.      Past Medical History:  Diagnosis Date  . Adenocarcinoma in situ of cervix   . Arthritis    hands  . Breast cancer ([place])   . Colon cancer ([place])   . Diabetes mellitus without complication ([place])   . Endometrial carcinoma ([facility])    s/p total abdominal hysterectomy  . H/O compression fracture of spine 2014   thoracic spine  . H/O polycythemia vera   . H/O TIA (transient ischemic attack) and stroke 09/2014, 03/2015   No deficits  . Hypertension   . Hyperuricemia   . Microalbuminuria   . Polycythemia vera ([place])   . Recurrent falls   . Skin cancer    face, legs  . Stroke [facility]) 2008   no deficits  . Trochanteric bursitis   . Varicose veins    treated        Past Surgical History:  Procedure Laterality Date  . ABDOMINAL HYSTERECTOMY    . BOWEL RESECTION N/A 03/28/2015   Procedure: SMALL BOWEL RESECTION;  Surgeon: Leonie Green, MD;  Location: [facility] ORS;  Service: General;  Laterality: N/A;  . CATARACT EXTRACTION W/ INTRAOCULAR LENS IMPLANT Right   . CATARACT EXTRACTION W/PHACO Left 10/07/2015   Procedure: CATARACT EXTRACTION PHACO AND INTRAOCULAR LENS PLACEMENT (IOC);  Surgeon: Ronnell Freshwater, MD;  Location: [facility];  Service: Ophthalmology;  Laterality: Left;  DIABETIC - oral meds VISION BLUE  . CORONARY ANGIOGRAPHY N/A 10/29/2017   Procedure: CORONARY ANGIOGRAPHY;  Surgeon: Dionisio David, MD;  Location: [place] CV  LAB;  Service: Cardiovascular;  Laterality: N/A;  . EXPLORATORY LAPAROTOMY     for fibroids  . LEFT HEART CATH Right 10/29/2017   Procedure: Left Heart Cath;  Surgeon: Dionisio David, MD;  Location: [facility] LAB;  Service: Cardiovascular;  Laterality: Right;  . TONSILLECTOMY          Family History  Problem Relation Age of Onset  . Cancer Brother        AML  . Heart attack Mother   . Breast cancer Mother   . Heart attack Father   . Heart attack Sister   . Diabetes Sister    Social History:  reports that she has quit smoking. She has never used smokeless tobacco. She reports that she does not drink alcohol or use drugs. Allergies:       Allergies  Allergen Reactions  . Simvastatin     Other reaction(s): Muscle Pain         Medications Prior to Admission  Medication Sig Dispense Refill  . alendronate (FOSAMAX) 70 MG tablet Take 70 mg by mouth once a week. Take with a full glass of water on an empty stomach.    . calcium-vitamin D (OSCAL WITH D) 500-200 MG-UNIT per tablet Take 1 tablet by mouth daily.     . carvedilol (COREG) 6.25 MG tablet Take 6.25 mg by mouth 2 (two) times daily with a meal.    . chlorthalidone (HYGROTON) 25 MG tablet Take 25 mg by mouth daily.    . clopidogrel (PLAVIX) 75 MG tablet Take 1 tablet (75 mg total) by mouth daily. 30 tablet 0  . docusate sodium (COLACE) 250 MG capsule Take 250 mg by mouth daily as needed for constipation.    . fexofenadine (ALLEGRA) 180 MG tablet Take 180 mg by mouth daily as needed.     . hydroxyurea (HYDREA) 500 MG capsule TAKE 1 CAPSULE BY MOUTH ON MONDAY WEDNESDAY FRIDAY AND Saturday. Take  1 capsule every other Tuesday WITH FOOD (Patient taking differently: 500 mg. TAKE 1 CAPSULE BY MOUTH ON Sunday through Friday.  Do no take on Saturday) 54 capsule 1  . loperamide (IMODIUM) 2 MG capsule Take 4 mg by mouth as needed for diarrhea or loose stools.    . metFORMIN (GLUCOPHAGE) 1000 MG tablet  Take 1,000 mg by mouth 2 (two) times daily.     . pioglitazone (ACTOS) 15 MG tablet Take 15 mg by mouth daily.    . polyethylene glycol (MIRALAX / GLYCOLAX) packet Take 17 g by mouth daily as needed.    . potassium chloride (K-DUR,KLOR-CON) 10 MEQ tablet Take 10 mEq by mouth daily.    . pravastatin (PRAVACHOL) 20 MG tablet Take 20 mg by mouth at bedtime.     . Probiotic Product (PROBIOTIC DAILY PO) Take 1 tablet by mouth daily.    . ramipril (ALTACE) 10 MG capsule Take 10 mg by mouth 2 (two) times daily.     . verapamil (CALAN-SR) 240 MG CR tablet Take 240 mg by mouth daily.      Home: Home Living Family/patient expects to be discharged to:: Private residence Living Arrangements: Alone Available Help at Discharge: Family, Available PRN/intermittently Type of Home: House Home Access: Stairs to enter [employer] of Steps: 2  Entrance Stairs-Rails: Left Home Layout: One level Bathroom Shower/Tub: Multimedia programmer: Handicapped height Home Equipment: [place] - single point, Environmental consultant - 2 wheels  Lives With: Alone  Functional History: Prior Function Level of Independence: Independent Comments: ADLs, IADLs, and driving. 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 Functional Status:  Mobility: Bed Mobility Overal bed mobility: Needs Assistance Bed Mobility: Supine to Sit Supine to sit: Min guard, HOB elevated General bed mobility comments: HOb 25degrees with increased time and use of rail pt able to pivot to EOB without physical assist, guarding for lines and safety Transfers Overall transfer level: Needs assistance Equipment used: Rolling walker (2 wheeled) Transfers: Sit to/from Stand Sit to Stand: Min assist General transfer comment: min assist to rise from bed and bSC with cues for hand placement Ambulation/Gait Ambulation/Gait assistance: Min assist Ambulation Distance (Feet): 80 Feet Assistive device: Rolling walker (2 wheeled) Gait  Pattern/deviations: Step-through pattern, Decreased stride length, Trunk flexed General Gait Details: cues for position in RW, assist to direct and move RW with turning, slow steady gait with reliance on RW Gait velocity interpretation: <1.31 ft/sec, indicative of household ambulator  ADL: ADL Overall ADL's : Needs assistance/impaired Eating/Feeding: Set up, Sitting Grooming: Wash/dry face, Set up, Sitting Upper Body Bathing: Minimal assistance, Sitting Lower Body Bathing: Maximal assistance, Sit to/from stand Upper Body Dressing : Minimal assistance, Sitting Lower Body Dressing: Maximal assistance, Sit to/from stand Lower Body Dressing Details (indicate cue type and reason): Max A to don socks at EOB. Pt able to don left sock with assistance to start task and then maintain left ankle on right knee. Toilet Transfer: Minimal assistance, +2 for safety/equipment, Stand-pivot, RW Toileting- Clothing Manipulation and Hygiene: Maximal assistance, Sit to/from stand, +2 for physical assistance Toileting - Clothing Manipulation Details (indicate cue type and reason): Pt able to maintain standing balance with Min A for right lateral lean while second person performs toilet hygiene. Pt too fatigued to perform self peri care Functional mobility during ADLs: Minimal assistance, Rolling walker(stand pivot only) General ADL Comments: Pt with decreased fucntional performance. Presenting with decreased activity tolerance, balance, strength, and cognition.   Cognition: Cognition Overall Cognitive Status: Impaired/Different from baseline Arousal/Alertness: Awake/alert Orientation Level: Oriented X4 Attention: Focused, Sustained, Selective, Alternating Focused Attention: Appears intact Sustained Attention: Impaired Sustained Attention Impairment: Verbal basic, Functional basic Selective Attention: Impaired Selective Attention Impairment: Verbal basic, Functional basic Alternating Attention:  Impaired Alternating Attention Impairment: Verbal basic Memory: Impaired Memory Impairment: Storage deficit, Decreased recall of new information(Verbal recall 10/15pt) Awareness: Impaired Awareness Impairment: Emergent impairment(pt felt testing did not go as well as she expected) Problem Solving: Impaired Problem Solving Impairment: Verbal basic(sustained attention impacting) Executive Function: Reasoning, Initiating Reasoning: Impaired Reasoning Impairment: Verbal basic, Functional basic Initiating: Impaired Initiating Impairment: Functional basic Behaviors: Perseveration, Other (comment)(avoidant) Cognition Arousal/Alertness: Awake/alert Behavior During Therapy: Flat affect Overall Cognitive Status: Impaired/Different from baseline Area of Impairment: Following commands, Safety/judgement, Problem solving Current Attention Level: Selective Following Commands: Follows one step commands consistently Awareness: Emergent Problem Solving: Slow processing General Comments: pt moving well and able to tolerate increased activity, increased time to initiate and slow processing  Blood pressure 135/77, pulse 92, temperature 98.2 F (36.8 C), temperature source Axillary, resp. rate (!) 21, weight 56.7 kg (125 lb), SpO2 96 %. Physical Exam  Vitals reviewed. Constitutional: She is oriented to person, place, and time. She appears well-developed.  85 year old frail female  HENT:  Head: Normocephalic and atraumatic.  Eyes: EOM are normal. Right eye exhibits no discharge. Left  eye exhibits no discharge.  Neck: Normal range of motion. Neck supple. No thyromegaly present.  Cardiovascular: Normal rate and regular rhythm.  Respiratory:  Fair inspiratory effort.  Clear to auscultation +[place]  GI: Soft. Bowel sounds are normal. She exhibits no distension.  Musculoskeletal:  No edema or tenderness in extremities  Neurological: She is alert and oriented to person, place, and time.   Dysarthria Motor: Bilateral upper extremities: 4 -/5 proximal to distal Left lower extremity: 4 -/5 proximal to distal Right lower extremity: 4 -/5 proximal to distal (weaker than left side) Sensation intact to light touch  Skin: Skin is warm and dry.  Psychiatric: She has a normal mood and affect. Her behavior is normal.  Assessment/Plan: Diagnosis: right cerebellar hemorrhage with cardiac debility Labs and images independently reviewed.  Records reviewed and summated above. Stroke: Continue secondary stroke prophylaxis and Risk Factor Modification listed below:   Blood Pressure Management:  Continue current medication with prn's with permisive HTN per primary team Diabetes management:    1. Does the need for close, 24 hr/day medical supervision in concert with the patient's rehab needs make it unreasonable for this patient to be served in a less intensive setting? Potentially  2. Co-Morbidities requiring supervision/potential complications: adenosarcoma of cervix, endometrial carcinoma status post total abdominal hysterectomy, DM (Monitor in accordance with exercise and adjust meds as necessary), polycythemia vera (follow labs), TIA, CHF (Monitor in accordance with increased physical activity and avoid UE resistance excercises), supplement oxygen dependent (wean as tolerated), HTN (monitor and provide prns in accordance with increased physical exertion and pain), hypernatremia (continue to monitor, wean hypertonic saline when appropriate), leukocytosis (cont to monitor for signs and symptoms of infection, further workup if indicated), ABLA (transfuse if necessary to ensure appropriate perfusion for increased activity tolerance), AKI vs CKD (avoid nephrotoxic medications) 3. Due to bladder management, safety, disease management and patient education, does the patient require 24 hr/day rehab nursing? Yes 4. Does the patient require coordinated care of a physician, rehab nurse, PT (1-2 hrs/day, 5  days/week), OT (1-2 hrs/day, 5 days/week) and SLP (1-2 hrs/day, 5 days/week) to address physical and functional deficits in the context of the above medical diagnosis(es)? Yes Addressing deficits in the following areas: balance, endurance, locomotion, strength, transferring, bowel/bladder control, bathing, dressing, toileting, speech and psychosocial support 5. Can the patient actively participate in an intensive therapy program of at least 3 hrs of therapy per day at least 5 days per week? No 6. The potential for patient to make measurable gains while on inpatient rehab is excellent and good 7. Anticipated functional outcomes upon discharge from inpatient rehab are supervision  with PT, supervision with OT, modified independent with SLP. 8. Estimated rehab length of stay to reach the above functional goals is: 7-12 days. 9. Anticipated D/C setting: Other 10. Anticipated post D/C treatments: SNF 11. Overall Rehab/Functional Prognosis: good  RECOMMENDATIONS: This patient's condition is appropriate for continued rehabilitative care in the following setting: Given significant comorbidities, patient more appropriate for less intense rehabilitation over a longer period of time. Further, patient without caregiver support at discharge with plans to reside in rehabilitation facility in [place] per family. At this time recommend SNF after medically stable and medical workup complete with PM&R outpatient follow up. Patient has agreed to participate in recommended program. Yes Note that insurance prior authorization may be required for reimbursement for recommended care.  Comment: Rehab Admissions Coordinator to follow up.   I have personally performed a face to face diagnostic evaluation,  including, but not limited to relevant history and physical exam findings, of this patient and developed relevant assessment and plan.  Additionally, I have reviewed and concur with the physician assistant's  documentation above.   Delice Lesch, MD, ABPMR Lavon Paganini Angiulli, PA-C 11/01/2017          Revision History                   Routing History

## 2017-11-05 NOTE — Evaluation (Signed)
Occupational Therapy Assessment and Plan  Patient Details  Name: [name] MRN: [id number] Date of Birth: Mar 08, 1932  OT Diagnosis: abnormal posture, apraxia, cognitive deficits, hemiplegia affecting non-dominant side, muscle weakness (generalized) and coordination disorder Rehab Potential:   ELOS:   12-14  Today's Date: 11/05/2017 OT Individual Time: 5732-2025 OT Individual Time Calculation (min): 75 min     Problem List:  Patient Active Problem List   Diagnosis Date Noted  . H/O cerebral parenchymal hemorrhage 11/04/2017  . Essential hypertension 11/04/2017  . Hyperlipidemia 11/04/2017  . Diabetes ([place]) 11/04/2017  . CAD (coronary artery disease) 11/04/2017  . 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 Hypokalemia 11/04/2017  . Right-sided nontraumatic intracerebral hemorrhage of cerebellum ([place])   . History of cervical cancer   . History of TIA (transient ischemic attack)   . Acute systolic congestive heart failure ([place])   . Reactive hypertension   . Hypernatremia   . Leukocytosis   . Acute blood loss anemia   . Elevated serum creatinine   . Acute respiratory failure with hypoxia ([place])   . IVH (intraventricular hemorrhage) ([place]) 10/29/2017  . Hypoxia 10/28/2017  . Pancreatic lesion 05/24/2017  . 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 Pulmonary nodule, left 06/04/2015  . 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 Cerebral thrombosis with cerebral infarction 04/03/2015  . Cancer of right colon ([place]) 03/28/2015  . CVA (cerebral infarction) 12/21/2014  . Polycythemia vera ([place]) 06/22/2006    Past Medical History:  Past Medical History:  Diagnosis Date  . Adenocarcinoma in situ of cervix   . Arthritis    hands  . Breast cancer ([place])   . Colon cancer ([place])   . Diabetes mellitus without complication ([place])   . Endometrial carcinoma (H[facility])    s/p total abdominal hysterectomy  . H/O compression fracture of spine 2014   thoracic spine  .  H/O polycythemia vera   . H/O TIA (transient ischemic attack) and stroke 09/2014, 03/2015   No deficits  . Hypertension   . Hyperuricemia   . Microalbuminuria   . Polycythemia vera ([place])   . Recurrent falls   . Skin cancer    face, legs  . Stroke [facility]) 2008   no deficits  . Trochanteric bursitis   . Varicose veins    treated   Past Surgical History:  Past Surgical History:  Procedure Laterality Date  . ABDOMINAL HYSTERECTOMY    . BOWEL RESECTION N/A 03/28/2015   Procedure: SMALL BOWEL RESECTION;  Surgeon: Leonie Green, MD;  Location: [facility] ORS;  Service: General;  Laterality: N/A;  . CATARACT EXTRACTION W/ INTRAOCULAR LENS IMPLANT Right   . CATARACT EXTRACTION W/PHACO Left 10/07/2015   Procedure: CATARACT EXTRACTION PHACO AND INTRAOCULAR LENS PLACEMENT (IOC);  Surgeon: Ronnell Freshwater, MD;  Location: [place];  Service: Ophthalmology;  Laterality: Left;  DIABETIC - oral meds VISION BLUE  . CORONARY ANGIOGRAPHY N/A 10/29/2017   Procedure: CORONARY ANGIOGRAPHY;  Surgeon: Dionisio David, MD;  Location: [place] [facility];  Service: Cardiovascular;  Laterality: N/A;  . EXPLORATORY LAPAROTOMY     for fibroids  . LEFT HEART CATH Right 10/29/2017   Procedure: Left Heart Cath;  Surgeon: Dionisio David, MD;  Location: [place] [facility];  Service: Cardiovascular;  Laterality: Right;  . TONSILLECTOMY      Assessment & Plan Clinical Impression: Ulla Mckiernan. Otani is an 84 year old right-handed female with history of adenosarcoma of cervix, endometrial carcinoma status  post total abdominal hysterectomy, diabetes mellitus, polycythemia vera, TIA with recurrent falls maintained on Plavix.  Per chart review and niece patient lives alone.  One level home 2 steps to entry.  Reportedly independent prior to admission and driving.  [facility] 10/28/2017 with altered mental status, hematemesis and lethargy as well as difficulty in breathing.  Oxygen saturations in the 70s  requiring nonrebreather mask.  Blood pressure 174/113, WBC 26,000, troponin 1.02, lactic acid 3.1.  CT angiogram of the chest showed pericardial effusion and cardiomegaly consistent with pulmonary edema.  CT of the head reviewed showing right cerebellar hemorrhage.  Per report, right cerebellar parenchymal hemorrhage measuring 31 x 16 x 19 mm.  There was extension of hemorrhage into the fourth ventricle without hydrocephalus.  Patient initially had been placed on 3% saline.  Patient was ruled in for non-Q wave myocardial infarction underwent cardiac catheterization showing three-vessel disease with severe LV dysfunction.  CVTS consulted for possible need for CABG was not felt to be a good candidate due to multiple comorbidities.  She was transferred to [facility] for further evaluation.  Echocardiogram with ejection fraction 25% as well as akinesis of the anterior myocardium.  Neurosurgery Dr. Sherley Bounds follow-up for cerebellar hemorrhage advise conservative care with follow-up cranial CT scan 10/31/2017 as well as MRI 11/01/2017 stable.  Subcutaneous Lovenox was later initiated for DVT prophylaxis.  Placed on intravenous Lasix for diuresis per cardiology services.  Plan was to consider PCI only once able to tolerate dual antiplatelet medications.  Patient was cleared to begin aspirin 81 mg daily 11/05/2017.Marland Kitchen    Patient currently requires min- max with basic self-care skills secondary to muscle weakness, decreased cardiorespiratoy endurance, motor apraxia, decreased coordination and decreased motor planning, decreased visual motor skills, decreased initiation, decreased attention, decreased awareness, decreased problem solving, decreased safety awareness, decreased memory and delayed processing and decreased sitting balance, decreased standing balance, decreased postural control, hemiplegia and decreased balance strategies.  Prior to hospitalization, patient could complete BADLs/IADLs with independent  .  Patient will benefit from skilled intervention to decrease level of assist with basic self-care skills and increase independence with basic self-care skills prior to discharge home with care partner.  Anticipate patient will require 24 hour supervision and follow up home health.  OT - End of Session Activity Tolerance: Tolerates 10 - 20 min activity with multiple rests Endurance Deficit: Yes Endurance Deficit Description: requires frequent and extended rest breaks due to fatigue; O2 sats remained WFL on 2L throughout though increased effort of breathing noted OT Assessment Rehab Potential (ACUTE ONLY): Good OT Barriers to Discharge: Decreased caregiver support;Lack of/limited family support OT Patient demonstrates impairments in the following area(s): Balance;Cognition;Endurance;Motor;Perception;Safety OT Basic ADL's Functional Problem(s): Grooming;Dressing;Toileting OT Advanced ADL's Functional Problem(s): Simple Meal Preparation OT Transfers Functional Problem(s): Toilet OT Additional Impairment(s): Fuctional Use of Upper Extremity OT Plan OT Intensity: Minimum of 1-2 x/day, 45 to 90 minutes OT Frequency: 5 out of 7 days OT Duration/Estimated Length of Stay: 12-14 OT Treatment/Interventions: Balance/vestibular training;Discharge planning;Pain management;Self Care/advanced ADL retraining;Therapeutic Activities;UE/LE Coordination activities;Cognitive remediation/compensation;Disease mangement/prevention;Functional mobility training;Patient/family education;Skin care/wound managment;Therapeutic Exercise;Visual/perceptual remediation/compensation;Community reintegration;DME/adaptive equipment instruction;Neuromuscular re-education;Psychosocial support;UE/LE Strength taining/ROM;Wheelchair propulsion/positioning OT Basic Self-Care Anticipated Outcome(s): S OT Toileting Anticipated Outcome(s): S OT Bathroom Transfers Anticipated Outcome(s): S OT Recommendation Patient destination:  Home Follow Up Recommendations: Home health OT Equipment Recommended: 3 in 1 bedside comode;Tub/shower bench;Tub/shower seat;To be determined   Skilled Therapeutic Intervention 1;1. Pt lethargic throughout session and c/o dizziness upon sitting and vitals WNL  on 2L O2. Pt supine>sitting EOB with A for trunk elevation. Pt stand pivot transfers EOB<>w/c<>BSC using rail/grab bar with MOD lifting A and VC for sequencing. Pt initially agreeable to bathing and dressing, however pt reports fatigue and declines bathing. Pt requires increased tactile cueing to maintain arousal with audible breathing d/t fatigue/decreased endurance. Pt dons bra with A to fasten in back and shirt with A to pull down back/weight shift forward. Pt completes LB dressing with A to don socks but able to thread pants. Pt reports needing to toilet and completes toileting with TOTAL A and min standing balance. Exited session with pt seated in bed, call lighti in reach and all needs met  OT Evaluation Precautions/Restrictions  Precautions Precautions: Fall Precaution Comments: oxygen tank for ambulation Restrictions Weight Bearing Restrictions: No General Chart Reviewed: Yes Vital Signs Therapy Vitals Pulse Rate: 69 Resp: 17 BP: (!) 141/82 Patient Position (if appropriate): Sitting Oxygen Therapy SpO2: 96 % O2 Device: Room Air Pain Pain Assessment Pain Scale: 0-10 Pain Score: 0-No pain Home Living/Prior Functioning Home Living Family/patient expects to be discharged to:: Private residence Living Arrangements: Other (Comment) Available Help at Discharge: Family, Available PRN/intermittently Type of Home: House Home Access: Stairs to enter Technical brewer of Steps: 2 Entrance Stairs-Rails: Left Home Layout: One level Bathroom Shower/Tub: Multimedia programmer: Handicapped height Additional Comments: (old walker; per pr grab bar in shower)  Lives With: Alone Prior Function Vocation: Other  (comment)(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[place]) ADL   Vision Baseline Vision/History: Wears glasses Wears Glasses: At all times Patient Visual Report: No change from baseline Vision Assessment?: Yes Ocular Range of Motion: Within Functional Limits Tracking/Visual Pursuits: Decreased smoothness of horizontal tracking Saccades: Within functional limits Convergence: Impaired - to be further tested in functional context Perception  Perception: Within Functional Limits Praxis Praxis: Impaired Praxis Impairment Details: Initiation Cognition Overall Cognitive Status: Impaired/Different from baseline Arousal/Alertness: Awake/alert Year: 2019 Month: May Day of Week: Correct Memory: Impaired Memory Impairment: Decreased recall of new information Immediate Memory Recall: Sock;Blue;Bed Memory Recall: Bed;Blue Memory Recall Blue: Without Cue Memory Recall Bed: Without Cue Attention: Selective Selective Attention: Impaired Selective Attention Impairment: Functional basic;Verbal basic Awareness: Impaired Awareness Impairment: Emergent impairment Sensation Sensation Light Touch: Appears Intact Coordination Gross Motor Movements are Fluid and Coordinated: No Fine Motor Movements are Fluid and Coordinated: No Coordination and Movement Description: able to oppose all digits to thumb but slow Motor  Motor Motor: Hemiplegia Motor - Skilled Clinical Observations: mild R Mobility  Transfers Transfers: Sit to Stand Sit to Stand: 3: Mod assist Sit to Stand Details: Manual facilitation for placement;Verbal cues for technique;Verbal cues for sequencing;Verbal cues for precautions/safety  Trunk/Postural Assessment  Cervical Assessment Cervical Assessment: Exceptions to WFL(head forward) Thoracic Assessment Thoracic Assessment: Exceptions to WFL(kyphotic) Lumbar Assessment Lumbar Assessment: Exceptions to WFL(posterior pelvic tilt) Postural Control Postural Control: Deficits on  evaluation(delayed)  Balance Balance Balance Assessed: Yes(standing balance min A static) Dynamic Sitting Balance Sitting balance - Comments: able to sit without UE support EOB Extremity/Trunk Assessment RUE Assessment RUE Assessment: Exceptions to WFL(generalized weakness ) LUE Assessment LUE Assessment: Exceptions to WFL(generalized weakness)   See Function Navigator for Current Functional Status.   Refer to Care Plan for Long Term Goals  Recommendations for other services: Therapeutic Recreation  Pet therapy   Discharge Criteria: Patient will be discharged from OT if patient refuses treatment 3 consecutive times without medical reason, if treatment goals not met, if there is a change in medical  status, if patient makes no progress towards goals or if patient is discharged from hospital.  The above assessment, treatment plan, treatment alternatives and goals were discussed and mutually agreed upon: by patient  Tonny Branch 11/05/2017, 10:15 AM

## 2017-11-05 NOTE — IPOC Note (Signed)
Overall Plan of Care [facility]) Patient Details Name: [name] MRN: [id number] DOB: 1931-12-23  Admitting Diagnosis: <principal problem not specified>right cerebellar hemorrhage  Hospital Problems: Active Problems:   H/O cerebral parenchymal hemorrhage     Functional Problem List: Nursing Bladder, Skin Integrity, Medication Management, Safety, Endurance, Motor, Nutrition, Pain, Perception  PT Balance, Endurance, Motor, Pain, Safety, Skin Integrity  OT Balance, Cognition, Endurance, Motor, Perception, Safety  SLP Linguistic, Nutrition  TR         Basic ADL's: OT Grooming, Bathing, Dressing, Toileting     Advanced  ADL's: OT Simple Meal Preparation     Transfers: PT Bed Mobility, Car, Bed to Chair, Manufacturing systems engineer, Metallurgist: PT Ambulation, Emergency planning/management officer, Stairs     Additional Impairments: OT Fuctional Use of Upper Extremity  SLP Swallowing, Communication expression    TR      Anticipated Outcomes Item Anticipated Outcome  Self Feeding no goal  Swallowing  Mod I    Basic self-care  S  Toileting  S   Bathroom Transfers S  Bowel/Bladder  Patient will manage Bowel and Bladder with Moderate Assist at Discharge.   Transfers  supervision overall  Locomotion  supervision household gait distances and for stairs for home entry  Communication  Supervision   Cognition     Pain  Patient will manage pain at 3 or less on a pain scale of 0-10.  Safety/Judgment  Patient will remain free of falls and injury with Minimal Assist cues while in IP Rehab.   Therapy Plan: PT Intensity: Minimum of 1-2 x/day ,45 to 90 minutes PT Frequency: 5 out of 7 days(monitor; pt may benefit from 15/7 schedule) PT Duration Estimated Length of Stay: 10-14 days OT Intensity: Minimum of 1-2 x/day, 45 to 90 minutes OT Frequency: 5 out of 7 days OT Duration/Estimated Length of Stay: 12-14 SLP Intensity: Minumum of 1-2 x/day, 30 to 90 minutes SLP Frequency: 3  to 5 out of 7 days SLP Duration/Estimated Length of Stay: 10-12 days     Team Interventions: Nursing Interventions Patient/Family Education, Disease Management/Prevention, Bladder Management, Pain Management, Bowel Management, Medication Management, Dysphagia/Aspiration Precaution Training, Cognitive Remediation/Compensation, Psychosocial Support, Skin Care/Wound Management  PT interventions Ambulation/gait training, Balance/vestibular training, Cognitive remediation/compensation, Discharge planning, Disease management/prevention, DME/adaptive equipment instruction, Functional mobility training, Neuromuscular re-education, Pain management, Patient/family education, Psychosocial support, Skin care/wound management, Stair training, Therapeutic Activities, Therapeutic Exercise, UE/LE Strength taining/ROM, UE/LE Coordination activities, Wheelchair propulsion/positioning  OT Interventions Balance/vestibular training, Discharge planning, Pain management, Self Care/advanced ADL retraining, Therapeutic Activities, UE/LE Coordination activities, Cognitive remediation/compensation, Disease mangement/prevention, Functional mobility training, Patient/family education, Skin care/wound managment, Therapeutic Exercise, Visual/perceptual remediation/compensation, Academic librarian, Engineer, drilling, Neuromuscular re-education, Psychosocial support, UE/LE Strength taining/ROM, Wheelchair propulsion/positioning  SLP Interventions Cognitive remediation/compensation, English as a second language teacher, Dysphagia/aspiration precaution training, Environmental controls, Internal/external aids, Functional tasks, Patient/family education, Speech/Language facilitation  TR Interventions    SW/CM Interventions Discharge Planning, Psychosocial Support, Patient/Family Education   Barriers to Discharge MD  Medical stability  Nursing      PT Decreased caregiver support, New oxygen per chart appears there may be hired  caregivers available; currently reocmmending 24/7 supervision due to cognitive impairments and high fall risk due to h/o of falls  OT Decreased caregiver support, Lack of/limited family support    SLP Decreased caregiver support    SW Decreased caregiver support Does not currently have 24 hr care   Team Discharge Planning: Destination: PT-Home ,OT- Home , SLP-Home Projected Follow-up: PT-Home  health PT, 24 hour supervision/assistance, OT-  Home health OT, SLP-Home Health SLP, 24 hour supervision/assistance Projected Equipment Needs: PT-Rolling walker with 5" wheels, OT- 3 in 1 bedside comode, Tub/shower bench, Tub/shower seat, To be determined, SLP-None recommended by SLP Equipment Details: PT- , OT-  Patient/family involved in discharge planning: PT- Patient,  OT-Patient, SLP-Patient  MD ELOS: 10-14 days Medical Rehab Prognosis:  Excellent Assessment: The patient has been admitted for CIR therapies with the diagnosis of right cerebellar hemorrhage. The team will be addressing functional mobility, strength, stamina, balance, safety, adaptive techniques and equipment, self-care, bowel and bladder mgt, patient and caregiver education, NMR, cognitive perceptual rx, vestibular rx. Goals have been set at supervision for mobility, self-care and communication.Meredith Staggers, MD, FAAPMR      See Team Conference Notes for weekly updates to the plan of care

## 2017-11-05 NOTE — Progress Notes (Signed)
Subjective/Complaints: Slept ok No BM but a lot of gas  ROS-  Neg CP, SOB, N/V/D  Objective: Vital Signs: Blood pressure (!) 141/82, pulse 69, temperature 98.1 F (36.7 C), temperature source Oral, resp. rate 17, height 5' 1"  (1.549 m), weight 60.8 kg (134 lb 0.6 oz), SpO2 96 %. Dg Chest Port 1 View  Result Date: 11/03/2017 CLINICAL DATA:  Elevated white blood cell count, pulmonary edema and respiratory failure, intraventricular hemorrhage. EXAM: PORTABLE CHEST 1 VIEW COMPARISON:  Portable chest x-ray of Oct 31, 2017 FINDINGS: The lungs are well-expanded. The interstitial markings are increased and more conspicuous today. The pulmonary vascularity is engorged and more prominent. The cardiac silhouette remains enlarged. There is calcification in the wall of the thoracic aorta. There is a small left pleural effusion. There is chronic compression of a midthoracic vertebral body. The right-sided PICC line tip projects over the midportion of the SVC. IMPRESSION: Worsening of pulmonary interstitial edema secondary to CHF. Possible left lower lobe atelectasis or pneumonia. Thoracic aortic atherosclerosis. Electronically Signed   By: David  Martinique M.D.   On: 11/03/2017 09:49   Results for orders placed or performed during the hospital encounter of 11/04/17 (from the past 72 hour(s))  Glucose, capillary     Status: Abnormal   Collection Time: 11/04/17  4:32 PM  Result Value Ref Range   Glucose-Capillary 136 (H) 65 - 99 mg/dL  CBC     Status: Abnormal   Collection Time: 11/04/17  5:03 PM  Result Value Ref Range   WBC 17.0 (H) 4.0 - 10.5 K/uL   RBC 4.29 3.87 - 5.11 MIL/uL   Hemoglobin 11.3 (L) 12.0 - 15.0 g/dL   HCT 37.5 36.0 - 46.0 %   MCV 87.4 78.0 - 100.0 fL   MCH 26.3 26.0 - 34.0 pg   MCHC 30.1 30.0 - 36.0 g/dL   RDW 18.2 (H) 11.5 - 15.5 %   Platelets 245 150 - 400 K/uL    Comment: Performed at [facility], [place] [address], [place], [place] [address]  Creatinine, serum     Status:  Abnormal   Collection Time: 11/04/17  5:03 PM  Result Value Ref Range   Creatinine, Ser 1.37 (H) 0.44 - 1.00 mg/dL   GFR calc non Af Amer 35 (L) >60 mL/min   GFR calc Af Amer 40 (L) >60 mL/min    Comment: (NOTE) The eGFR has been calculated using the CKD EPI equation. This calculation has not been validated in all clinical situations. eGFR's persistently <60 mL/min signify possible Chronic Kidney Disease. Performed at [facility], [place] [address], [place], [place] [address]      HEENT: normal Cardio: RRR Resp: CTA B/L GI: BS positive and Distended, non tender Extremity:  No Edema Skin:   Other PICC site is clean, no erythema Neuro: Alert/Oriented, Abnormal Motor 4/5 in RUE and RLE , 5/5 on Left side and Abnormal FMC Ataxic/ dec FMC Musc/Skel:  Other No pain with UE or LE ROM Gen NAD   Assessment/Plan: 1. Functional deficits secondary to RIght hemiataxia due Right cerebellar ICH which require 3+ hours per day of interdisciplinary therapy in a comprehensive inpatient rehab setting. Physiatrist is providing close team supervision and 24 hour management of active medical problems listed below. Physiatrist and rehab team continue to assess barriers to discharge/monitor patient progress toward functional and medical goals. FIM:       Function - Toileting Toileting activity did not occur: (Bedpan) Toileting steps completed by  helper: Performs perineal hygiene Toileting Assistive Devices: Other (comment)(Bedpan)           Function - Comprehension Comprehension: Auditory Comprehension assist level: Follows complex conversation/direction with extra time/assistive device  Function - Expression Expression: Verbal  Function - Social Interaction Social Interaction assist level: Interacts appropriately with others with medication or extra time (anti-anxiety, antidepressant).  Function - Problem Solving Problem solving assist level: Solves complex problems: With extra  time  Function - Memory Memory assist level: Recognizes or recalls 90% of the time/requires cueing < 10% of the time Patient normally able to recall (first 3 days only): Current season, Staff names and faces, That he or she is in a hospital  Medical Problem List and Plan: 1.Altered mental status with lethargysecondary to right cerebellar parenchymal hemorrhage felt to be secondary to hypertensive crisis  PT, OT, SLP evals today 2. DVT Prophylaxis/Anticoagulation: Subcutaneous Lovenox 30 mg daily initiated 11/01/2017 3. Pain Management:Tylenol as needed 4. Mood:Provide emotional support 5. Neuropsych: This patientiscapable of making decisions on herown behalf. 6. Skin/Wound Care:Routine skin checks 7. Fluids/Electrolytes/Nutrition:Routine in and outs with follow-up chemistries 8.Acute anteroseptal wall MI with multivessel calcific three-vessel disease. Not a candidate for CABG. Patient cleared to begin aspirin 81 mg daily 11/05/2017.Plan for PCI as an outpatient follow-up 9.Decompensated systolic congestive heart failure. Monitor for any signs of fluid overload. Lasix 40 mg daily 10.Hypertension. Coreg 12.5 mg twice daily, Altace 10 mg twice daily, Aldactone 25 mg daily, Nitro-Dur patch 0.2 mg daily Vitals:   11/04/17 2201 11/05/17 0617  BP: 124/73 (!) 141/82  Pulse: 70 69  Resp: 18 17  Temp: 98.1 F (36.7 C)   SpO2: 96% 96%  controlled 5/17 11.Diabetes mellitus with peripheral neuropathy. Levemir 6 units nightly. Check blood sugars before meals and at bedtime CBG (last 3)  Recent Labs    11/04/17 0731 11/04/17 1147 11/04/17 1632  GLUCAP 156* 155* 136*  controlled 5/17 12.History of breast, colon, endometrial and cervical cancer. Continue hydroxyurea  LOS (Days) 1 A FACE TO FACE EVALUATION WAS PERFORMED  Charlett Blake 11/05/2017, 6:19 AM

## 2017-11-05 NOTE — Evaluation (Signed)
Physical Therapy Assessment and Plan  Patient Details  Name: [name] MRN: [id number] Date of Birth: 1931/11/06  PT Diagnosis: Abnormal posture, Cognitive deficits, Difficulty walking, Muscle weakness and Pain in joint Rehab Potential: Good ELOS: 10-14 days   Today's Date: 11/05/2017 PT Individual Time: 1300-1400 PT Individual Time Calculation (min): 60 min    Problem List:  Patient Active Problem List   Diagnosis Date Noted  . H/O cerebral parenchymal hemorrhage 11/04/2017  . Essential hypertension 11/04/2017  . Hyperlipidemia 11/04/2017  . Diabetes ([place]) 11/04/2017  . CAD (coronary artery disease) 11/04/2017  . 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 Hypokalemia 11/04/2017  . 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 History of cervical cancer   . History of TIA (transient ischemic attack)   . Acute systolic congestive heart failure ([place])   . Reactive hypertension   . Hypernatremia   . Leukocytosis   . Acute blood loss anemia   . Elevated serum creatinine   . Acute respiratory failure with hypoxia ([place])   . IVH (intraventricular hemorrhage) ([place]) 10/29/2017  . Hypoxia 10/28/2017  . Pancreatic lesion 05/24/2017  . 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 Pulmonary nodule, left 06/04/2015  . 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 Cerebral thrombosis with cerebral infarction 04/03/2015  . Cancer of right colon ([place]) 03/28/2015  . CVA (cerebral infarction) 12/21/2014  . Polycythemia vera ([place]) 06/22/2006    Past Medical History:  Past Medical History:  Diagnosis Date  . Adenocarcinoma in situ of cervix   . Arthritis    hands  . Breast cancer ([place])   . Colon cancer ([place])   . Diabetes mellitus without complication ([place])   . Endometrial carcinoma (HCC)    s/p total abdominal hysterectomy  . H/O compression fracture of spine 2014   thoracic spine  . H/O polycythemia vera   . H/O TIA (transient  ischemic attack) and stroke 09/2014, 03/2015   No deficits  . Hypertension   . Hyperuricemia   . Microalbuminuria   . Polycythemia vera ([place])   . Recurrent falls   . Skin cancer    face, legs  . Stroke [facility]) 2008   no deficits  . Trochanteric bursitis   . Varicose veins    treated   Past Surgical History:  Past Surgical History:  Procedure Laterality Date  . ABDOMINAL HYSTERECTOMY    . BOWEL RESECTION N/A 03/28/2015   Procedure: SMALL BOWEL RESECTION;  Surgeon: Leonie Green, MD;  Location: [facility] ORS;  Service: General;  Laterality: N/A;  . CATARACT EXTRACTION W/ INTRAOCULAR LENS IMPLANT Right   . CATARACT EXTRACTION W/PHACO Left 10/07/2015   Procedure: CATARACT EXTRACTION PHACO AND INTRAOCULAR LENS PLACEMENT (IOC);  Surgeon: Ronnell Freshwater, MD;  Location: [place];  Service: Ophthalmology;  Laterality: Left;  DIABETIC - oral meds VISION BLUE  . CORONARY ANGIOGRAPHY N/A 10/29/2017   Procedure: CORONARY ANGIOGRAPHY;  Surgeon: Dionisio David, MD;  Location: [place] CV LAB;  Service: Cardiovascular;  Laterality: N/A;  . EXPLORATORY LAPAROTOMY     for fibroids  . LEFT HEART CATH Right 10/29/2017   Procedure: Left Heart Cath;  Surgeon: Dionisio David, MD;  Location: [place] CV LAB;  Service: Cardiovascular;  Laterality: Right;  . TONSILLECTOMY      Assessment & Plan Clinical Impression: Patient is an 84 year old right-handed female with history of adenosarcoma of cervix, endometrial carcinoma status post total abdominal hysterectomy, diabetes mellitus, polycythemia  vera, TIA with recurrent falls maintained on Plavix. Per chart review and niece patient lives alone. One level home 2 steps to entry. Reportedly independent prior to admission and driving. Presented to [facility]28[facility]2017 with altered mental status, hematemesis and lethargy as well as difficulty in breathing. Oxygen saturations in the 70s requiring nonrebreather mask. Blood pressure 174/113,  WBC 26,000, troponin 1.02, lactic acid 3.1. CT angiogram of the chest showed pericardial effusion and cardiomegaly consistent with pulmonary edema. CT of the head reviewed showing right cerebellar hemorrhage. Per report, right cerebellar parenchymal hemorrhage measuring 31 x 16 x 19 mm. There was extension of hemorrhage into the fourth ventricle without hydrocephalus. Patient initially had been placed on 3% saline. Patient was ruled in for non-Q wave myocardial infarction underwent cardiac catheterization showing three-vessel disease with severe LV dysfunction. CVTS consulted for possible need for CABG was not felt to be a good candidate due to multiple comorbidities. She was transferred to [facility] for further evaluation. Echocardiogram with ejection fraction 25% as well as akinesis of the anterior myocardium. Neurosurgery Dr. Sherley Bounds follow-up for cerebellar hemorrhage advise conservative care with follow-up cranial CT scan 10/31/2017 as well as MRI 11/01/2017 stable. Subcutaneous Lovenox was later initiated for DVT prophylaxis. Placed on intravenous Lasix for diuresis per cardiology services. Plan was to consider PCI only once able to tolerate dual antiplatelet medications. Patient was cleared to begin aspirin 81 mg daily 11/05/2017.Physical and occupational therapy evaluations completed with recommendations of physical medicine rehab consult.   Patient transferred to CIR on 11/04/2017 .   Patient currently requires min to max assist with mobility secondary to muscle weakness and muscle joint tightness, decreased cardiorespiratoy endurance and decreased oxygen support, decreased motor planning, decreased initiation, decreased attention, decreased awareness, decreased problem solving and decreased memory and decreased sitting balance, decreased standing balance, decreased postural control and decreased balance strategies.  Prior to hospitalization, patient was independent  with  mobility and lived with Alone in a House home.  Home access is 2Stairs to enter.  Patient will benefit from skilled PT intervention to maximize safe functional mobility, minimize fall risk and decrease caregiver burden for planned discharge home with intermittent assist.  Anticipate patient will benefit from follow up [facility] at discharge.  PT - End of Session Activity Tolerance: Decreased this session Endurance Deficit: Yes Endurance Deficit Description: requires frequent and extended rest breaks due to fatigue; O2 sats remained WFL on 2L throughout though increased effort of breathing noted PT Assessment Rehab Potential (ACUTE/IP ONLY): Good PT Barriers to Discharge: Decreased caregiver support;New oxygen PT Barriers to Discharge Comments: per chart appears there may be hired caregivers available; currently reocmmending 24/7 supervision due to cognitive impairments and high fall risk due to h/o of falls PT Patient demonstrates impairments in the following area(s): Balance;Endurance;Motor;Pain;Safety;Skin Integrity PT Transfers Functional Problem(s): Bed Mobility;Car;Bed to Chair;Furniture PT Locomotion Functional Problem(s): Ambulation;Wheelchair Mobility;Stairs PT Plan PT Intensity: Minimum of 1-2 x/day ,45 to 90 minutes PT Frequency: 5 out of 7 days(monitor; pt may benefit from 15/7 schedule) PT Duration Estimated Length of Stay: 10-14 days PT Treatment/Interventions: Ambulation/gait training;Balance/vestibular training;Cognitive remediation/compensation;Discharge planning;Disease management/prevention;DME/adaptive equipment instruction;Functional mobility training;Neuromuscular re-education;Pain management;Patient/family education;Psychosocial support;Skin care/wound management;Stair training;Therapeutic Activities;Therapeutic Exercise;UE/LE Strength taining/ROM;UE/LE Coordination activities;Wheelchair propulsion/positioning PT Transfers Anticipated Outcome(s): supervision overall PT Locomotion  Anticipated Outcome(s): supervision household gait distances and for stairs for home entry PT Recommendation Follow Up Recommendations: Home health PT;24 hour supervision/assistance Patient destination: Home Equipment Recommended: Rolling walker with 5" wheels  Skilled Therapeutic Intervention Evaluation completed (  see details above and below) with education on PT POC and goals and individual treatment initiated with focus on functional transfers with and without use of AD, initiation of gait training and stair negotiation in preparation for home entry, overall activity tolerance and muscular endurance, and education about rehab progress. Pt ranging from min to max (lifting and lowering assist) pending fatigue level and if challenged without support of UE's in upright position. Pt very limited endurance and required extra time and rest breaks between activities.    PT Evaluation Precautions/Restrictions Precautions Precautions: Fall Precaution Comments: 2L O2 via [place]; h/o CA and several falls Restrictions Weight Bearing Restrictions: No Pain  Reports generalized discomfort and overall fatigue.  Home Living/Prior Functioning Home Living Living Arrangements: Alone Available Help at Discharge: Family;Available PRN/intermittently(chart indicates plan to hire caregiver?) Type of Home: House Home Access: Stairs to enter [employer] of Steps: 2 Entrance Stairs-Rails: Left Home Layout: One level  Lives With: Alone Prior Function Level of Independence: Independent with gait;Independent with transfers  Able to Take Stairs?: Yes Driving: Yes Vision/Perception  Praxis Praxis Impairment Details: Motor planning  Cognition Overall Cognitive Status: Impaired/Different from baseline Selective Attention: Impaired Memory: Impaired Memory Impairment: Decreased recall of new information Awareness: Impaired Awareness Impairment: Emergent impairment Sensation Sensation Light Touch:  Appears Intact Proprioception: Appears Intact Coordination Gross Motor Movements are Fluid and Coordinated: Yes Motor  Motor Motor: Abnormal postural alignment and control;Other (comment) Motor - Skilled Clinical Observations: generalized weakness; R > L  Locomotion  Ambulation Ambulation: Yes Ambulation/Gait Assistance: 4: Min assist;3: Mod assist Ambulation Distance (Feet): 20 Feet Assistive device: Rolling walker Ambulation/Gait Assistance Details: Tactile cues for weight shifting;Verbal cues for safe use of DME/AE  Max assist for attempting a few steps without UE support and fearful Trunk/Postural Assessment  Cervical Assessment Cervical Assessment: Exceptions to WFL(forward head) Thoracic Assessment Thoracic Assessment: (kyphotic posture) Lumbar Assessment Lumbar Assessment: (posterior pelvic tilt) Postural Control Postural Control: Deficits on evaluation(delayed and inadequate without UE support to assist)  Balance Balance Balance Assessed: Yes Static Sitting Balance Static Sitting - Level of Assistance: 5: Stand by assistance Dynamic Sitting Balance Dynamic Sitting - Level of Assistance: 4: Min assist Static Standing Balance Static Standing - Level of Assistance: 4: Min assist Dynamic Standing Balance Dynamic Standing - Level of Assistance: 3: Mod assist Extremity Assessment   see OT eval for UE details   RLE Assessment RLE Assessment: Exceptions to [facility] RLE Strength RLE Overall Strength Comments: grossly 3+/5; decreased muscular endurance LLE Assessment LLE Assessment: Exceptions to [facility] LLE Strength LLE Overall Strength Comments: grossly 4-/5; decreased muscular endurance   See Function Navigator for Current Functional Status.   Refer to Care Plan for Long Term Goals  Recommendations for other services: Neuropsych  Discharge Criteria: Patient will be discharged from PT if patient refuses treatment 3 consecutive times without medical reason, if treatment  goals not met, if there is a change in medical status, if patient makes no progress towards goals or if patient is discharged from hospital.  The above assessment, treatment plan, treatment alternatives and goals were discussed and mutually agreed upon: by patient  Juanna Cao, PT, DPT  11/05/2017, 3:55 PM

## 2017-11-05 NOTE — Progress Notes (Signed)
Social Work  Social Work Assessment and Plan  Patient Details  Name: [name] MRN: [id number] Date of Birth: 1932/03/08  Today's Date: 11/05/2017  Problem List:  Patient Active Problem List   Diagnosis Date Noted  . H/O cerebral parenchymal hemorrhage 11/04/2017  . Essential hypertension 11/04/2017  . Hyperlipidemia 11/04/2017  . Diabetes ([place]) 11/04/2017  . CAD (coronary artery disease) 11/04/2017  . 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 Hypokalemia 11/04/2017  . Right-sided nontraumatic intracerebral hemorrhage of cerebellum ([place])   . History of cervical cancer   . History of TIA (transient ischemic attack)   . Acute systolic congestive heart failure ([place])   . Reactive hypertension   . Hypernatremia   . Leukocytosis   . Acute blood loss anemia   . Elevated serum creatinine   . Acute respiratory failure with hypoxia ([place])   . IVH (intraventricular hemorrhage) ([place]) 10/29/2017  . Hypoxia 10/28/2017  . Pancreatic lesion 05/24/2017  . 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 Pulmonary nodule, left 06/04/2015  . 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 Cerebral thrombosis with cerebral infarction 04/03/2015  . Cancer of right colon ([place]) 03/28/2015  . CVA (cerebral infarction) 12/21/2014  . Polycythemia vera ([place]) 06/22/2006   Past Medical History:  Past Medical History:  Diagnosis Date  . Adenocarcinoma in situ of cervix   . Arthritis    hands  . Breast cancer ([place])   . Colon cancer ([place])   . Diabetes mellitus without complication ([place])   . Endometrial carcinoma (HCC)    s/p total abdominal hysterectomy  . H/O compression fracture of spine 2014   thoracic spine  . H/O polycythemia vera   . H/O TIA (transient ischemic attack) and stroke 09/2014, 03/2015   No deficits  . Hypertension   . Hyperuricemia   . Microalbuminuria   . Polycythemia vera ([place])   . Recurrent falls   . Skin cancer    face, legs  . Stroke [facility] 2008   no  deficits  . Trochanteric bursitis   . Varicose veins    treated   Past Surgical History:  Past Surgical History:  Procedure Laterality Date  . ABDOMINAL HYSTERECTOMY    . BOWEL RESECTION N/A 03/28/2015   Procedure: SMALL BOWEL RESECTION;  Surgeon: Leonie Green, MD;  Location: [facility] ORS;  Service: General;  Laterality: N/A;  . CATARACT EXTRACTION W/ INTRAOCULAR LENS IMPLANT Right   . CATARACT EXTRACTION W/PHACO Left 10/07/2015   Procedure: CATARACT EXTRACTION PHACO AND INTRAOCULAR LENS PLACEMENT (IOC);  Surgeon: Ronnell Freshwater, MD;  Location: [place];  Service: Ophthalmology;  Laterality: Left;  DIABETIC - oral meds VISION BLUE  . CORONARY ANGIOGRAPHY N/A 10/29/2017   Procedure: CORONARY ANGIOGRAPHY;  Surgeon: Dionisio David, MD;  Location: [place] CV LAB;  Service: Cardiovascular;  Laterality: N/A;  . EXPLORATORY LAPAROTOMY     for fibroids  . LEFT HEART CATH Right 10/29/2017   Procedure: Left Heart Cath;  Surgeon: Dionisio David, MD;  Location: [place] CV LAB;  Service: Cardiovascular;  Laterality: Right;  . TONSILLECTOMY     Social History:  reports that she has quit smoking. She has never used smokeless tobacco. She reports that she does not drink alcohol or use drugs.  Family / Support Systems Marital Status: Widow/Widower Patient Roles: Other (Comment)(Aunt/friend) Other Supports: Deeann Dowse- niece- [phone]  [phone]    Mehak Roskelley [phone] Anticipated Caregiver: Niece to arrange for care depending  upon what she needs at DC Ability/Limitations of Caregiver: Niece work and live out of town Careers adviser: Other (Comment)(May need to come up with a plan) Family Dynamics: Close with both of her nieces and has many friends. Her friends can come by and check on her, but she will need to hire someone if she needs care. She is doing well and needs to get stronger while here  Social  History Preferred language: English Religion: Non-Denominational Cultural Background: No issues Education: Secretary/administrator educated Read: Yes Write: Yes Employment Status: Retired Freight forwarder Issues: No issues Guardian/Conservator: none-according to MD pt is capable of making her own decisions while here. Will include [name] per pt's wishes    Abuse/Neglect Abuse/Neglect Assessment Can Be Completed: Yes Physical Abuse: Denies Verbal Abuse: Denies Sexual Abuse: Denies Exploitation of patient/patient's resources: Denies Self-Neglect: Denies  Emotional Status Pt's affect, behavior adn adjustment status: Pt is motivated to do well she was compleetly independent prior to admission. She lived alone and drove herself. She hopes to get back as independent as she can be upon discharge. Niece voiced she will do it if she is capable, she is not one to give up. Recent Psychosocial Issues: other health issues were managed by PCP Pyschiatric History: No history deferred depression screen due to pt is exhausted from therapies today. Still adjusting to the new unit and going to therapies. Will ask team for their input regarding seeing neuro-psych while here. Substance Abuse History: No issues  Patient / Family Perceptions, Expectations & Goals Pt/Family understanding of illness & functional limitations: Pt and niece can explain her stroke and deficits. Her main issue is her balance and her weakness, she has good movement in her limbs but needs to get stronger. Both talk with the MD's involved and feel they have a good understanding Premorbid pt/family roles/activities: Aunt, friend, church member, retiree, etc Anticipated changes in roles/activities/participation: resume Pt/family expectations/goals: Pt states: I want to get stronger and be able to stay alone."  Niece states: " We hope she does well here and can get back to as close as she was before ."  [employer]: None Premorbid Home Care/DME Agencies: Other (Comment)(RW, cane from previous admits) Transportation available at discharge: Friend & family Resource referrals recommended: Support group (specify)  Discharge Planning Living Arrangements: Alone Support Systems: Other relatives, Friends/neighbors, Social worker community Type of Residence: Private residence Insurance Resources: Multimedia programmer (specify)(UHC-Medicare) Financial Resources: Social Security Financial Screen Referred: No Living Expenses: Own Money Management: Patient Does the patient have any problems obtaining your medications?: No Home Management: has housekeeper Patient/Family Preliminary Plans: Return home with intermittent assist, niece reports she has a long term care policy and hopes to see what is covered from this. They realize she may need care and will need to arrange for this if necessary. Will await team's evaluations. [place] Barriers to Discharge: Decreased caregiver support Sw Barriers to Discharge Comments: Does not currently have 24 hr care Social Work Anticipated Follow Up Needs: HH/OP, Support Group  Clinical Impression Pleasant female who has been very independent and taken care of herself since her husband died. She is hoping to get back to this level before leaving here. Her nieces are supportive and involved and will assist with arranging assist if needed. She has many friends who visit and will check in on her. Will await teams' evaluations and work on a safe plan for pt.  Elease Hashimoto 11/05/2017, 2:56 PM

## 2017-11-05 NOTE — Progress Notes (Signed)
Shift assessment, pt labile emotions at start of shift, crying, tearful, missing husband who passed away, pt neighbor here visiting, pt calms down after a few minutes, remained more stable the rest of shift

## 2017-11-05 NOTE — Evaluation (Signed)
Speech Language Pathology Assessment and Plan  Patient Details  Name: [name] MRN: [id number] Date of Birth: 07-04-1931  SLP Diagnosis: Dysarthria;Dysphagia  Rehab Potential: Fair ELOS: 10-12 days     Today's Date: 11/05/2017 SLP Individual Time: 0800-0900 SLP Individual Time Calculation (min): 60 min   Problem List:  Patient Active Problem List   Diagnosis Date Noted  . H/O cerebral parenchymal hemorrhage 11/04/2017  . Essential hypertension 11/04/2017  . Hyperlipidemia 11/04/2017  . Diabetes ([place]) 11/04/2017  . CAD (coronary artery disease) 11/04/2017  . 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 Hypokalemia 11/04/2017  . 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 History of cervical cancer   . History of TIA (transient ischemic attack)   . Acute systolic congestive heart failure ([place])   . Reactive hypertension   . Hypernatremia   . Leukocytosis   . Acute blood loss anemia   . Elevated serum creatinine   . Acute respiratory failure with hypoxia ([place])   . IVH (intraventricular hemorrhage) ([place]) 10/29/2017  . Hypoxia 10/28/2017  . Pancreatic lesion 05/24/2017  . 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 Pulmonary nodule, left 06/04/2015  . 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 Cerebral thrombosis with cerebral infarction 04/03/2015  . Cancer of right colon ([place]) 03/28/2015  . CVA (cerebral infarction) 12/21/2014  . Polycythemia vera ([place]) 06/22/2006   Past Medical History:  Past Medical History:  Diagnosis Date  . Adenocarcinoma in situ of cervix   . Arthritis    hands  . Breast cancer ([place])   . Colon cancer ([place])   . Diabetes mellitus without complication ([place])   . Endometrial carcinoma ([facility])    s/p total abdominal hysterectomy  . H/O compression fracture of spine 2014   thoracic spine  . H/O polycythemia vera   . H/O TIA (transient ischemic attack) and stroke 09/2014, 03/2015   No deficits  .  Hypertension   . Hyperuricemia   . Microalbuminuria   . Polycythemia vera ([place])   . Recurrent falls   . Skin cancer    face, legs  . Stroke [facility]) 2008   no deficits  . Trochanteric bursitis   . Varicose veins    treated   Past Surgical History:  Past Surgical History:  Procedure Laterality Date  . ABDOMINAL HYSTERECTOMY    . BOWEL RESECTION N/A 03/28/2015   Procedure: SMALL BOWEL RESECTION;  Surgeon: Leonie Green, MD;  Location: [facility] ORS;  Service: General;  Laterality: N/A;  . CATARACT EXTRACTION W/ INTRAOCULAR LENS IMPLANT Right   . CATARACT EXTRACTION W/PHACO Left 10/07/2015   Procedure: CATARACT EXTRACTION PHACO AND INTRAOCULAR LENS PLACEMENT (IOC);  Surgeon: Ronnell Freshwater, MD;  Location: [facility];  Service: Ophthalmology;  Laterality: Left;  DIABETIC - oral meds VISION BLUE  . CORONARY ANGIOGRAPHY N/A 10/29/2017   Procedure: CORONARY ANGIOGRAPHY;  Surgeon: Dionisio David, MD;  Location: [place] [facility];  Service: Cardiovascular;  Laterality: N/A;  . EXPLORATORY LAPAROTOMY     for fibroids  . LEFT HEART CATH Right 10/29/2017   Procedure: Left Heart Cath;  Surgeon: Dionisio David, MD;  Location: [place] [facility];  Service: Cardiovascular;  Laterality: Right;  . TONSILLECTOMY      Assessment / Plan / Recommendation Clinical Impression   [name] is an 84 year old right-handed female with history of adenosarcoma of cervix, endometrial carcinoma status post total abdominal hysterectomy, diabetes mellitus, polycythemia vera, TIA with  recurrent falls maintained on Plavix. Per chart review and niece patient lives alone. One level home 2 steps to entry. Reportedly independent prior to admission and driving. [facility]ed to [facility]28[facility]2017 with altered mental status, hematemesis and lethargy as well as difficulty in breathing. Oxygen saturations in the 70s requiring nonrebreather mask. Blood pressure 174/113, WBC 26,000, troponin 1.02, lactic  acid 3.1. CT angiogram of the chest showed pericardial effusion and cardiomegaly consistent with pulmonary edema. CT of the head reviewed showing right cerebellar hemorrhage. Per report, right cerebellar parenchymal hemorrhage measuring 31 x 16 x 19 mm. There was extension of hemorrhage into the fourth ventricle without hydrocephalus. Patient initially had been placed on 3% saline. Patient was ruled in for non-Q wave myocardial infarction underwent cardiac catheterization showing three-vessel disease with severe LV dysfunction. CVTS consulted for possible need for CABG was not felt to be a good candidate due to multiple comorbidities. She was transferred to [facility] for further evaluation. Echocardiogram with ejection fraction 25% as well as akinesis of the anterior myocardium. Neurosurgery Dr. Sherley Bounds follow-up for cerebellar hemorrhage advise conservative care with follow-up cranial CT scan 10/31/2017 as well as MRI 11/01/2017 stable. Subcutaneous Lovenox was later initiated for DVT prophylaxis. Placed on intravenous Lasix for diuresis per cardiology services. Plan was to consider PCI only once able to tolerate dual antiplatelet medications. Patient was cleared to begin aspirin 81 mg daily 11/05/2017.Physical and occupational therapy evaluations completed with recommendations of physical medicine rehab consult. Patient was admitted for a comprehensive rehab program. Pt presents with a moderate dysarthria characterized by decreased vocal intensity and imprecise articulation of consonants due to rapid rushes of speech which leads to impaired speech intelligibility at the sentence level.  As a result, pt needed mod assist verbal and visual cues for use of compensatory strategies to achieve intelligibility.  Informally, pt presents with decreased selective attention to task; however, pt's lethargy precluded in depth evaluation of cognition and further diagnostic treatment is warranted.    A bedside swallow evaluation was also performed.  Pt has chronic xerostomia which impacts oral phase of swallow and leads to decreased interest in eating and drinking.  Pt's manipulation of boluses is prolonged but functional with no residual solids left in the oral cavity post swallow with supervision cues. My concern is that pt's debility and decreased respiratory status has the potential to impact pt's swallowing safety over the course of the day.  As a result, will follow up for management of safe diet progression with improved endurance.    Skilled Therapeutic Interventions          Cognitive-linguistic and bedside swallow evaluation completed with results and recommendations reviewed with patient.      SLP Assessment  Patient will need skilled Speech Lanaguage Pathology Services during CIR admission    Recommendations  SLP Diet Recommendations: Dysphagia 3 (Mech soft);Thin Liquid Administration via: Cup;Straw Medication Administration: Whole meds with liquid Supervision: Patient able to self feed;Intermittent supervision to cue for compensatory strategies Compensations: Minimize environmental distractions;Slow rate;Small sips/bites Postural Changes and/or Swallow Maneuvers: Out of bed for meals Patient destination: Home Follow up Recommendations: Home Health SLP;24 hour supervision/assistance Equipment Recommended: None recommended by SLP    SLP Frequency 3 to 5 out of 7 days   SLP Duration  SLP Intensity  SLP Treatment/Interventions 10-12 days   Minumum of 1-2 x/day, 30 to 90 minutes  Cognitive remediation/compensation;Cueing hierarchy;Dysphagia/aspiration precaution training;Environmental controls;Internal/external aids;Functional tasks;Patient/family education;Speech/Language facilitation    Pain Pain Assessment Pain Score: 0-No  pain  Prior Functioning Type of Home: House  Lives With: Alone Available Help at Discharge: Family;Available  PRN/intermittently  Function:  Eating Eating   Modified Consistency Diet: Yes Eating Assist Level: Supervision or verbal cues           Cognition Comprehension Comprehension assist level: Follows complex conversation/direction with extra time/assistive device  Expression   Expression assist level: Expresses basic 50 - 74% of the time/requires cueing 25 - 49% of the time. Needs to repeat parts of sentences.  Social Interaction Social Interaction assist level: Interacts appropriately 90% of the time - Needs monitoring or encouragement for participation or interaction.  Problem Solving Problem solving assist level: Solves basic 75 - 89% of the time/requires cueing 10 - 24% of the time  Memory Memory assist level: Recognizes or recalls 50 - 74% of the time/requires cueing 25 - 49% of the time   Short Term Goals: Week 1: SLP Short Term Goal 1 (Week 1): Pt will consume trials of regular solids with supervision cues for use of swallowing precautions and no observable signs of fatigue or changes in respiratory status.   SLP Short Term Goal 2 (Week 1): Pt will overarticulate and increase vocal intensity to achieve intelligibility at the sentence level with min cues for use of swallowing precautions.    Refer to Care Plan for Long Term Goals  Recommendations for other services: None   Discharge Criteria: Patient will be discharged from SLP if patient refuses treatment 3 consecutive times without medical reason, if treatment goals not met, if there is a change in medical status, if patient makes no progress towards goals or if patient is discharged from hospital.  The above assessment, treatment plan, treatment alternatives and goals were discussed and mutually agreed upon: by patient  Emilio Math 11/05/2017, 12:39 PM

## 2017-11-05 NOTE — Progress Notes (Signed)
PMR Admission Coordinator Pre-Admission Assessment  Patient: [name] is an 84 y.o., female MRN: [id number] DOB: Jan 01, 1932 Height:   Weight: 56.7 kg (125 lb)                                                                                                                                                  Insurance Information HMO:     PPO: X     PCP:      IPA:      80/20:      OTHER:  PRIMARY: UHC Medicare      Policy#: [id number]      Subscriber: Self CM Name: Vevelyn Royals       Phone#: [phone]     Fax#: [phone] Pre-Cert#: [id number] for 7 days 11/04/17-11/10/17 with faxed updates due to Vevelyn Royals      Employer: Retired  Benefits:  Phone #: [phone]     Name: Verified online at [facility] online portal  Eff. Date: 07/23/17     Deduct: $0      Out of Pocket Max: $3300      Life Max: N/A CIR: $105 a day, days 1-10; $0 a day, days 11+      SNF: $0 a day, days 1-20; $50 a day, days 21-100 Outpatient: Necessity     Co-Pay: $20 per visit  Home Health: Necessity, 100%       Co-Pay: $0 DME: 80%     Co-Pay: 20% Providers: In-network   SECONDARY: None       Per patient and niece report they have a long-term care policy   Emergency Contact Information         Contact Information    Name Relation Home Work Mobile   Omak Niece [phone]  [phone]   Pablo Ledger [phone]  [phone]   Wimbish,Lottie Friend [phone]  [phone]   Wilfred, Siverson Niece   [phone]     Current Medical History  Patient Admitting Diagnosis: Right cerebellar hemorrhage with cardiac debility  History of Present Illness: Rajni Holsworth. Molnar is an 84 year old right-handed female with history of adenosarcoma of cervix, endometrial carcinoma status post total abdominal hysterectomy, diabetes mellitus, polycythemia vera, TIA with recurrent falls maintained on Plavix. Per chart review and niece patient lives alone. One level home 2 steps to entry. Reportedly  independent prior to admission and driving. Presented [facility]/28/2017 with altered mental status, hematemesis and lethargy as well as difficulty in breathing. Oxygen saturations in the 70s requiring nonrebreather mask. Blood pressure 174/113, WBC 26,000, troponin 1.02, lactic acid 3.1. CT angiogram of the chest showed pericardial effusion and cardiomegaly consistent with pulmonary edema. CT of the head reviewed showing right cerebellar hemorrhage. Per report, right cerebellar parenchymal hemorrhage measuring 31 x 16 x 19 mm. There was extension of hemorrhage into the fourth ventricle without hydrocephalus. Patient initially  had been placed on 3% saline. Patient was ruled in for non-Q wave myocardial infarction underwent cardiac catheterization showing three-vessel disease with severe LV dysfunction. CVTS consulted for possible need for CABG was not felt to be a good candidate due to multiple comorbidities. She was transferred to [facility] for further evaluation. Echocardiogram with ejection fraction 25% as well as akinesis of the anterior myocardium. Neurosurgery Dr. Sherley Bounds follow-up for cerebellar hemorrhage advise conservative care with follow-up cranial CT scan 10/31/2017 as well as MRI 11/01/2017 stable. Subcutaneous Lovenox was later initiated for DVT prophylaxis. Placed on intravenous Lasix for diuresis per cardiology services. Plan was to consider PCI only once able to tolerate dual antiplatelet medications. Patient was cleared to begin aspirin 81 mg daily 11/05/2017.Physical and occupational therapy evaluations completed with recommendations of physical medicine rehab consult. Patient was admitted for a comprehensive rehab program 11/04/17.  NIH Total: 1  Past Medical History      Past Medical History:  Diagnosis Date  . Adenocarcinoma in situ of cervix   . Arthritis    hands  . Breast cancer ([place])   . Colon cancer ([place])   . Diabetes mellitus without  complication ([place])   . Endometrial carcinoma ([facility])    s/p total abdominal hysterectomy  . H/O compression fracture of spine 2014   thoracic spine  . H/O polycythemia vera   . H/O TIA (transient ischemic attack) and stroke 09/2014, 03/2015   No deficits  . Hypertension   . Hyperuricemia   . Microalbuminuria   . Polycythemia vera ([place])   . Recurrent falls   . Skin cancer    face, legs  . Stroke [facility]) 2008   no deficits  . Trochanteric bursitis   . Varicose veins    treated    Family History  family history includes Breast cancer in her mother; Cancer in her brother; Diabetes in her sister; Heart attack in her father, mother, and sister.  Prior Rehab/Hospitalizations:  Has the patient had major surgery during 100 days prior to admission? No  Current Medications   Current Facility-Administered Medications:  .  carvedilol (COREG) tablet 12.5 mg, 12.5 mg, Oral, BID WC, Harwani, Mohan, MD, 12.5 mg at 11/04/17 0802 .  enoxaparin (LOVENOX) injection 30 mg, 30 mg, Subcutaneous, Q24H, Biby, Sharon L, NP, 30 mg at 11/04/17 0802 .  feeding supplement (GLUCERNA SHAKE) (GLUCERNA SHAKE) liquid 237 mL, 237 mL, Oral, TID BM, Garvin Fila, MD, 237 mL at 11/03/17 2238 .  [START ON 11/05/2017] furosemide (LASIX) tablet 40 mg, 40 mg, Oral, Daily, Harwani, Mohan, MD .  hydroxyurea (HYDREA) capsule 500 mg, 500 mg, Oral, Once per day on Sun Mon Tue Wed Thu Fri, Biby, Massie Kluver, NP, 500 mg at 11/04/17 0737 .  insulin aspart (novoLOG) injection 0-9 Units, 0-9 Units, Subcutaneous, TID WC, Aroor, Lanice Schwab, MD, 2 Units at 11/04/17 0802 .  insulin detemir (LEVEMIR) injection 6 Units, 6 Units, Subcutaneous, QHS, Biby, Sharon L, NP, 6 Units at 11/03/17 2238 .  MEDLINE mouth rinse, 15 mL, Mouth Rinse, BID, Aroor, Karena Addison R, MD, 15 mL at 11/04/17 0926 .  nitroGLYCERIN (NITRODUR - Dosed in mg/24 hr) patch 0.2 mg, 0.2 mg, Transdermal, Daily, Harwani, Mohan, MD .  pantoprazole (PROTONIX)  EC tablet 40 mg, 40 mg, Oral, Daily, Charolette Forward, MD, 40 mg at 11/04/17 0925 .  [START ON 11/05/2017] potassium chloride SA (K-DUR,KLOR-CON) CR tablet 20 mEq, 20 mEq, Oral, Daily, Harwani, Mohan, MD .  ramipril (ALTACE) capsule 10  mg, 10 mg, Oral, BID, Charolette Forward, MD, 10 mg at 11/04/17 0925 .  senna-docusate (Senokot-S) tablet 1 tablet, 1 tablet, Oral, BID, Aroor, Lanice Schwab, MD, 1 tablet at 11/04/17 0926 .  spironolactone (ALDACTONE) tablet 25 mg, 25 mg, Oral, Daily, Charolette Forward, MD, 25 mg at 11/04/17 8182  Patients Current Diet:       Diet Order           DIET DYS 3 Room service appropriate? Yes; Fluid consistency: Thin  Diet effective now          Precautions / Restrictions Precautions Precautions: Fall Precaution Comments: oxygen tank for ambulation Restrictions Weight Bearing Restrictions: No   Has the patient had 2 or more falls or a fall with injury in the past year?No  Prior Activity Level Community (5-7x/wk): Prior to admission patient lived alone, was active, fully independent, and driving.  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     Home Assistive Devices / Equipment Home Equipment: Cane - single point, Environmental consultant - 2 wheels  Prior Device Use: Indicate devices/aids used by the patient prior to current illness, exacerbation or injury? None of the above  Prior Functional Level Prior Function Level of Independence: Independent Comments: ADLs, IADLs, and driving. 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  Self Care: Did the patient need help bathing, dressing, using the toilet or eating? Independent  Indoor Mobility: Did the patient need assistance with walking from room to room (with or without device)? Independent  Stairs: Did the patient need assistance with internal or external stairs (with or without device)?  Independent  Functional Cognition: Did the patient need help planning regular tasks such as shopping or remembering to take medications? Independent  Current Functional Level Cognition  Arousal/Alertness: Awake/alert Overall Cognitive Status: Impaired/Different from baseline Current Attention Level: Selective Orientation Level: Oriented to person, Oriented to place, Oriented to situation, Disoriented to time Following Commands: Follows one step commands with increased time, Follows multi-step commands inconsistently Safety/Judgement: Decreased awareness of deficits General Comments: pt requires set by set processing to complete oral care. pt stops and states "what do you want me to do now" Attention: Focused, Sustained, Selective, Alternating Focused Attention: Appears intact Sustained Attention: Impaired Sustained Attention Impairment: Verbal basic, Functional basic Selective Attention: Impaired Selective Attention Impairment: Verbal basic, Functional basic Alternating Attention: Impaired Alternating Attention Impairment: Verbal basic Memory: Impaired Memory Impairment: Storage deficit, Decreased recall of new information(Verbal recall 10/15pt) Awareness: Impaired Awareness Impairment: Emergent impairment(pt felt testing did not go as well as she expected) Problem Solving: Impaired Problem Solving Impairment: Verbal basic(sustained attention impacting) Executive Function: Reasoning, Initiating Reasoning: Impaired Reasoning Impairment: Verbal basic, Functional basic Initiating: Impaired Initiating Impairment: Functional basic Behaviors: Perseveration, Other (comment)(avoidant)    Extremity Assessment (includes Sensation/Coordination)  Upper Extremity Assessment: Defer to OT evaluation RUE Deficits / Details: Pt reporting right sided weakness and decreased grasp in right hand. During testing, strength equal between left and right. Will further assess RUE Coordination:  decreased gross motor, decreased fine motor  Lower Extremity Assessment: Generalized weakness    ADLs  Overall ADL's : Needs assistance/impaired Eating/Feeding: Set up Eating/Feeding Details (indicate cue type and reason): niece often holding cups for patient and placing to mouth Grooming: Wash/dry face, Oral care, Standing Grooming Details (indicate cue type and reason): mod cues; min (A) standing to min guard (A) lateral support L UE Upper  Body Bathing: Minimal assistance, Sitting Lower Body Bathing: Maximal assistance, Sit to/from stand Upper Body Dressing : Minimal assistance, Sitting Lower Body Dressing: Maximal assistance Lower Body Dressing Details (indicate cue type and reason): Max A to don socks at EOB. Pt able to don left sock with assistance to start task and then maintain left ankle on right knee. Toilet Transfer: Moderate assistance, BSC Toilet Transfer Details (indicate cue type and reason): requires cues for bil UE on BSC. pt needs cues to anterior weight shift. pt holding RW and pushing backward Toileting- Clothing Manipulation and Hygiene: Maximal assistance, Sit to/from stand, +2 for physical assistance Toileting - Clothing Manipulation Details (indicate cue type and reason): Pt able to maintain standing balance with Min A for right lateral lean while second person performs toilet hygiene. Pt too fatigued to perform self peri care Functional mobility during ADLs: +2 for safety/equipment, +2 for physical assistance, Moderate assistance, Rolling walker General ADL Comments: Pt requires cues for hand placement and to turn RW. pt needs physical (A) to advance RW at times due to fatigue. pt with stable VSS throughout session. Pt with HR 89-94. pt with RR 28-44 with decr to 84% RA with transfer. pt static standing at sink 89-93% RA    Mobility  Overal bed mobility: Needs Assistance Bed Mobility: Supine to Sit Supine to sit: Min assist General bed mobility comments: exiting  the bed on L side with (A) to elevate trunk off bed surface. pt reports dizziness initially    Transfers  Overall transfer level: Needs assistance Equipment used: Rolling walker (2 wheeled) Transfers: Sit to/from Stand Sit to Stand: Mod assist General transfer comment: pt needs (A) To power up and surface elevated to help with power up    Ambulation / Gait / Stairs / Wheelchair Mobility  Ambulation/Gait Ambulation/Gait assistance: Museum/gallery curator (Feet): 80 Feet Assistive device: Rolling walker (2 wheeled) Gait Pattern/deviations: Step-through pattern, Decreased stride length, Trunk flexed General Gait Details: cues for postural check, positioning in the RW, needed occasional stability assist and control of the RW. Gait velocity: slower Gait velocity interpretation: <1.31 ft/sec, indicative of household ambulator    Posture / Balance Dynamic Sitting Balance Sitting balance - Comments: able to sit without UE support EOB Balance Overall balance assessment: Needs assistance Sitting-balance support: Bilateral upper extremity supported, Feet supported Sitting balance-Leahy Scale: Fair Sitting balance - Comments: able to sit without UE support EOB Standing balance support: Bilateral upper extremity supported, During functional activity Standing balance-Leahy Scale: Poor Standing balance comment: bil ue required in standing    Special needs/care consideration BiPAP/CPAP: No CPM: No Continuous Drip IV: No Dialysis: No         Life Vest: No Oxygen: None PTA, now requiring 1L nasal cannula  Special Bed: No Trach Size: No Wound Vac (area): No       Skin: Ecchymosis                               Location: bilateral hands  Bowel mgmt: Continent, last BM 11/03/17 Bladder mgmt: Intermittent incontinence  Diabetic mgmt: Yes with oral medication prior to admission; HgbA1c: 7.0     Previous Home Environment Living Arrangements: Alone  Lives With: Alone Available  Help at Discharge: Family, Available PRN/intermittently Type of Home: House Home Layout: One level Home Access: Stairs to enter Entrance Stairs-Rails: Left Entrance Stairs-Number of Steps: 2 Bathroom Shower/Tub: Multimedia programmer: Handicapped [place]  Services: No  Discharge Living Setting Plans for Discharge Living Setting: Patient's home, Alone, Other (Comment)(Niece aware of Supervision recommendation) Type of Home at Discharge: House Discharge Home Layout: One level Discharge Home Access: Stairs to enter Entrance Stairs-Rails: Left Entrance Stairs-Number of Steps: 2 Discharge Bathroom Shower/Tub: Walk-in shower, Other (comment)(with threshold ) Discharge Bathroom Toilet: (bedroom: standard; hall: elevated ) Discharge Bathroom Accessibility: (part way to sink and cammode, but to to shower ) Does the patient have any problems obtaining your medications?: No  Social/Family/Support Systems Patient Roles: Other (Comment)(Aunt and friend ) Contact Information: Niece: [name] [phone] Anticipated Caregiver: Niece to arrange in home care, patient has a long term care policy  Anticipated Caregiver's Contact Information: Niece to arrange hired assist, likley through an agency  Ability/Limitations of Caregiver: Niece works  Building control surveyor Availability: Intermittent Discharge Plan Discussed with Primary Caregiver: Yes Is Caregiver In Agreement with Plan?: Yes Does Caregiver/Family have Issues with Lodging/Transportation while Pt is in Rehab?: No  Goals/Additional Needs Patient/Family Goal for Rehab: PT/OT: Supervision; SLP: Mod I  Expected length of stay: 7-12 days  Dietary Needs: Carb. Mod. diet restrictions Equipment Needs: TBD Special Service Needs: None Pt/Family Agrees to Admission and willing to participate: Yes Program Orientation Provided & Reviewed with Pt/Caregiver Including Roles  & Responsibilities: Yes Additional Information Needs: Niece may  needs resources for hired in home assist  Information Needs to be Provided By: CSW FYI  Decrease burden of Care through IP rehab admission: No  Possible need for SNF placement upon discharge: No  Patient Condition: This patient's medical and functional status has changed since the consult dated: 11/01/17 at 1139 in which the Rehabilitation Physician determined and documented that the patient's condition is appropriate for intensive rehabilitative care in an inpatient rehabilitation facility. See "History of Present Illness" (above) for medical update. Functional changes are: Mod A transfers and Min A gait. Patient's medical and functional status update has been discussed with the Rehabilitation physician and patient remains appropriate for inpatient rehabilitation. Will admit to inpatient rehab today.  Preadmission Screen Completed By:  Gunnar Fusi, 11/04/2017 11:14 AM ______________________________________________________________________   Discussed status with Dr. Letta Pate on 11/04/17 at 1115 and received telephone approval for admission today.  Admission Coordinator:  Gunnar Fusi, time 1115/Date 11/04/17             Cosigned by: Charlett Blake, MD at 11/04/2017 11:30 AM  Revision History

## 2017-11-06 ENCOUNTER — Inpatient Hospital Stay (HOSPITAL_COMMUNITY): Payer: Medicare Other | Admitting: Speech Pathology

## 2017-11-06 ENCOUNTER — Inpatient Hospital Stay (HOSPITAL_COMMUNITY): Payer: Medicare Other | Admitting: *Deleted

## 2017-11-06 ENCOUNTER — Inpatient Hospital Stay (HOSPITAL_COMMUNITY): Payer: Medicare Other

## 2017-11-06 LAB — GLUCOSE, CAPILLARY
Glucose-Capillary: 99 mg/dL (ref 65–99)
Glucose-Capillary: 148 mg/dL — ABNORMAL HIGH (ref 65–99)
Glucose-Capillary: 157 mg/dL — ABNORMAL HIGH (ref 65–99)
Glucose-Capillary: 157 mg/dL — ABNORMAL HIGH (ref 65–99)

## 2017-11-06 NOTE — Progress Notes (Signed)
Speech Language Pathology Daily Session Note  Patient Details  Name: [name] MRN: [id number] Date of Birth: 30-Nov-1931  Today's Date: 11/06/2017 SLP Individual Time: 0907-1007 SLP Individual Time Calculation (min): 60 min  Short Term Goals: Week 1: SLP Short Term Goal 1 (Week 1): Pt will consume trials of regular solids with supervision cues for use of swallowing precautions and no observable signs of fatigue or changes in respiratory status.   SLP Short Term Goal 2 (Week 1): Pt will overarticulate and increase vocal intensity to achieve intelligibility at the sentence level with min cues for use of swallowing precautions.    Skilled Therapeutic Interventions:  Pt was seen for skilled ST targeting speech intelligibility goals.  SLP reviewed and reinforced speech intelligibility strategies and provided pt with a handout to facilitate carryover in between therapy sessions.  Pt needed min assist verbal cues to slow rate and increase vocal intensity to achieve intelligibility at the sentence level during structured sentence generation tasks.  Despite only needing min assist verbal cues during structured tasks, as pt became more fatigued she needed up to mod-max assist for use of strategies to achieve intelligibility in less structured contexts.  Pt was left in bed with bed alarm set and call bell within reach.  Continue per current plan of care.    Function:  Eating Eating                 Cognition Comprehension Comprehension assist level: Follows basic conversation/direction with no assist  Expression   Expression assist level: Expresses basic 50 - 74% of the time/requires cueing 25 - 49% of the time. Needs to repeat parts of sentences.  Social Interaction Social Interaction assist level: Interacts appropriately 90% of the time - Needs monitoring or encouragement for participation or interaction.  Problem Solving Problem solving assist level: Solves basic 75 - 89% of the  time/requires cueing 10 - 24% of the time  Memory Memory assist level: Recognizes or recalls 50 - 74% of the time/requires cueing 25 - 49% of the time    Pain Pain Assessment Pain Scale: 0-10 Pain Score: 0-No pain  Therapy/Group: Individual Therapy  Miki Blank, Selinda Orion 11/06/2017, 4:00 PM

## 2017-11-06 NOTE — Progress Notes (Signed)
Occupational Therapy Session Note  Patient Details  Name: [name] MRN: [id number] Date of Birth: [birth year]  Today's Date: 11/06/2017 OT Individual Time: 0700-0800 OT Individual Time Calculation (min): 60 min    Short Term Goals: Week 1:  OT Short Term Goal 1 (Week 1): Pt will sit to stand with min A to advance pants past hips wiht LRAD OT Short Term Goal 2 (Week 1): Pt will don socks with AE PRN and supervision OT Short Term Goal 3 (Week 1): Pt will groom at sink in standing for 2 items to demo improved standing endurance OT Short Term Goal 4 (Week 1): Pt will complete toilet transfer with MIN A and LRAD OT Short Term Goal 5 (Week 1): Pt will don shirt with supervision  Skilled Therapeutic Interventions/Progress Updates:    1;1. Pt with no c/o pain and agreeable to wash up at sink to conserve energy as opposed to shower. Pt supine to sitting with touchign A and VC for sequencing. Pt stand pivot transfer with min A to w/c from EOB. Pt able to locate soap on sink and requires min A for anterior trunk flexion during reach.. Pt perseverates on bathing face and requires VC for sequencing bathing body parts. Pt dons shirt with supervision and is able to pull shirt down back this date.  Pt able to cross into seated figure 4 to wash lower legs. Pt reports need to void B&B. Pt able to completes stand pivot with grab bar to toilet with touching A. Pt able to complete clothing management with MOD A for standing balance but requires A for thorough hygiene. Pt able to thread BLE into pants and standing with A for balance as pt adavances pants past hips. Pt completes  Oral care in sink and requires question cues for termination of water/sequencing. Exited session with pt seated in bed, call light in reach and all needs met.  Therapy Documentation Precautions:  Precautions Precautions: Fall Precaution Comments: 2L O2 via [place]; h/o CA and several falls Restrictions Weight Bearing Restrictions: No  See  Function Navigator for Current Functional Status.   Therapy/Group: Individual Therapy  Tonny Branch 11/06/2017, 7:15 AM

## 2017-11-06 NOTE — Plan of Care (Signed)
  Problem: RH BOWEL ELIMINATION Goal: RH STG MANAGE BOWEL WITH ASSISTANCE Description STG Manage Bowel with Assistance. Outcome: Progressing Goal: RH STG MANAGE BOWEL W/MEDICATION W/ASSISTANCE Description STG Manage Bowel with Medication with Assistance. Outcome: Progressing   Problem: RH BLADDER ELIMINATION Goal: RH STG MANAGE BLADDER WITH ASSISTANCE Description STG Manage Bladder With Assistance Outcome: Progressing   Problem: RH SKIN INTEGRITY Goal: RH STG MAINTAIN SKIN INTEGRITY WITH ASSISTANCE Description STG Maintain Skin Integrity With Assistance. Outcome: Progressing   Problem: RH SAFETY Goal: RH STG ADHERE TO SAFETY PRECAUTIONS W/ASSISTANCE/DEVICE Description STG Adhere to Safety Precautions With Assistance/Device. Outcome: Progressing Goal: RH STG DECREASED RISK OF FALL WITH ASSISTANCE Description STG Decreased Risk of Fall With Assistance. Outcome: Progressing   Problem: RH COGNITION-NURSING Goal: RH STG ANTICIPATES NEEDS/CALLS FOR ASSIST W/ASSIST/CUES Description STG Anticipates Needs/Calls for Assist With Assistance/Cues. Outcome: Progressing   Problem: RH PAIN MANAGEMENT Goal: RH STG PAIN MANAGED AT OR BELOW PT'S PAIN GOAL Outcome: Progressing

## 2017-11-06 NOTE — Progress Notes (Signed)
Subjective/Complaints: Slept well.  No complaints.  Objective: Vital Signs: Blood pressure (!) 145/79, pulse 70, temperature 97.7 F (36.5 C), temperature source Oral, resp. rate 18, height 5\' 1"  (1.549 m), weight 134 lb 0.6 oz (60.8 kg), SpO2 98 %.  Elderly female in no acute distress. HEENT exam atraumatic, normocephalic Neck is supple midline chest clear to auscultation Cardiac exam S1-S2 regular Abdominal exam active bowel sounds, soft. Neurologic exam: She is alert appears somewhat lethargic and slow.  Assessment/Plan: 1. Functional deficits secondary to RIght hemiataxia due Right cerebellar ICH  Medical Problem List and Plan: 1.Altered mental status with lethargysecondary to right cerebellar parenchymal hemorrhage felt to be secondary to hypertensive crisis   2. DVT Prophylaxis/Anticoagulation: Subcutaneous Lovenox 30 mg daily initiated 11/01/2017 3. Pain Management:Tylenol as needed 4. Mood:Provide emotional support 5. Neuropsych: This patientiscapable of making decisions on herown behalf. 6. Skin/Wound Care:Routine skin checks 7. 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  She is not a candidate for bypass surgery.  Continue aspirin with potential PCI as an outpatient.  9.Systolic congestive heart failure.  Currently no symptoms of fluid overload.   10.Hypertension.  130/63-145/79 Fair control for now.  Continue current medications.  May need to add additional medications. Vitals:   11/05/17 2121 11/06/17 0053  BP: (!) 144/72 (!) 145/79  Pulse: 71 70  Resp: 16 18  Temp: 97.7 F (36.5 C)   SpO2: 95% 98%   11.Type 2 diabetes.  Continue current medications.  Monitor CBGs. CBG (last 3)  Recent Labs    11/05/17 1714 11/05/17 2119 11/06/17 0623  GLUCAP 150* 173* 99  Fair control.  Continue current medications. 12.History of breast, colon, endometrial and cervical cancer. Continue hydroxyurea  LOS (Days) 2 A FACE TO FACE EVALUATION WAS PERFORMED  Bruce H Swords 11/06/2017, 6:48 AM

## 2017-11-06 NOTE — Progress Notes (Signed)
Physical Therapy Session Note  Patient Details  Name: [name] MRN: [id number] Date of Birth: 1931-09-26  Today's Date: 11/06/2017 PT Individual Time: 1300-1353 PT Individual Time Calculation (min): 53 min  and Today's Date: 11/06/2017 PT Missed Time: 22 Minutes Missed Time Reason: Patient fatigue  Short Term Goals: Week 1:  PT Short Term Goal 1 (Week 1): Pt will be able to perform functional transfers consistently with min assist PT Short Term Goal 2 (Week 1): Pt will be able to gait with LRAD x 75' with min assist PT Short Term Goal 3 (Week 1): Pt will be able to perform 4 steps with bilateral rails with min assist for strengthening and increased muscular endurance  Skilled Therapeutic Interventions/Progress Updates:    Tx focused on functional mobility training and therex for strengthening, but tx limited by fatigue. Pt had been up all day and needed cues to stay awake throughout tx. 02 sats 99% on 2.5L, able to bump down to 2L with 96% throughout tx.  Upon arrival, pt up in [facility], needing to void. Stand-pivot WC<>toilet with Mod A overall. Pt expressing complete fatigue but willing to continue with rest breaks and modification. Pt dependent for WC propulsion.  Pt engaged in functional reaching tasks for sitting balance and activity tolerance with multiu-modal cues for target at sink.  Gait x5' with RW and Mod A for balance. Nustep x59min with bil UE/LE level 3, limited by fatigue. Pt began crying due to fatigue.  Stand-pivot transfer Nustep>WC with Max A. WC>Bed Max A .  Sit>supine S with increased time/effort Supine therex for AAROM ankle pumps x20. Mass hip/knee flex/ext AAROM with facilitation for ext strengthening bil x20 Pt left asleep with all needs in reach, alarm on.     Therapy Documentation Precautions:  Precautions Precautions: Fall Precaution Comments: 2L O2 via [place]; h/o CA and several falls Restrictions Weight Bearing Restrictions: No   Pain: none    See  Function Navigator for Current Functional Status.   Therapy/Group: Individual Therapy  Lleyton Byers, Corinna Lines, PT, DPT  11/06/2017, 1:16 PM

## 2017-11-07 ENCOUNTER — Inpatient Hospital Stay (HOSPITAL_COMMUNITY): Payer: Medicare Other

## 2017-11-07 LAB — GLUCOSE, CAPILLARY
Glucose-Capillary: 110 mg/dL — ABNORMAL HIGH (ref 65–99)
Glucose-Capillary: 191 mg/dL — ABNORMAL HIGH (ref 65–99)
Glucose-Capillary: 185 mg/dL — ABNORMAL HIGH (ref 65–99)
Glucose-Capillary: 190 mg/dL — ABNORMAL HIGH (ref 65–99)

## 2017-11-07 MED ORDER — CARVEDILOL 25 MG PO TABS
25.0000 mg | ORAL_TABLET | Freq: Two times a day (BID) | ORAL | Status: DC
  Administered 2017-11-07 – 2017-11-17 (×20): 25 mg via ORAL
  Filled 2017-11-07 (×20): qty 1

## 2017-11-07 NOTE — Plan of Care (Signed)
  Problem: Consults Goal: RH STROKE PATIENT EDUCATION Description See Patient Education module for education specifics  Outcome: Progressing Goal: Nutrition Consult-if indicated Outcome: Progressing Goal: Diabetes Guidelines if Diabetic/Glucose > 140 Description If diabetic or lab glucose is > 140 mg/dl - Initiate Diabetes/Hyperglycemia Guidelines & Document Interventions  Outcome: Progressing   Problem: RH BOWEL ELIMINATION Goal: RH STG MANAGE BOWEL WITH ASSISTANCE Description STG Manage Bowel with Assistance. Outcome: Progressing Goal: RH STG MANAGE BOWEL W/MEDICATION W/ASSISTANCE Description STG Manage Bowel with Medication with Assistance. Outcome: Progressing Goal: RH STG MANAGE BOWEL W/EQUIPMENT W/ASSISTANCE Description STG Manage Bowel With Equipment With Assistance Outcome: Progressing Goal: RH OTHER STG BOWEL ELIMINATION GOALS W/ASSIST Description Other STG Bowel Elimination Goals With Assistance. Outcome: Progressing   Problem: RH BLADDER ELIMINATION Goal: RH STG MANAGE BLADDER WITH ASSISTANCE Description STG Manage Bladder With Assistance Outcome: Progressing Goal: RH STG MANAGE BLADDER WITH MEDICATION WITH ASSISTANCE Description STG Manage Bladder With Medication With Assistance. Outcome: Progressing Goal: RH STG MANAGE BLADDER WITH EQUIPMENT WITH ASSISTANCE Description STG Manage Bladder With Equipment With Assistance Outcome: Progressing Goal: RH OTHER STG BLADDER ELIMINATION GOALS W/ASSIST Description Other STG Bladder Elimination Goals With Assistance Outcome: Progressing   Problem: RH SKIN INTEGRITY Goal: RH STG MAINTAIN SKIN INTEGRITY WITH ASSISTANCE Description STG Maintain Skin Integrity With Assistance. Outcome: Progressing Goal: RH STG ABLE TO PERFORM INCISION/WOUND CARE W/ASSISTANCE Description STG Able To Perform Incision/Wound Care With Assistance. Outcome: Progressing Goal: RH OTHER STG SKIN INTEGRITY GOALS  W/ASSIST Description Other STG Skin Integrity Goals With Assistance. Outcome: Progressing   Problem: RH SAFETY Goal: RH STG ADHERE TO SAFETY PRECAUTIONS W/ASSISTANCE/DEVICE Description STG Adhere to Safety Precautions With Assistance/Device. Outcome: Progressing Goal: RH STG DECREASED RISK OF FALL WITH ASSISTANCE Description STG Decreased Risk of Fall With Assistance. Outcome: Progressing Goal: RH STG DEMO UNDERSTANDING HOME SAFETY PRECAUTIONS Outcome: Progressing Goal: RH OTHER STG SAFETY GOALS W/ASSIST Description Other STG Safety Goals With Assistance. Outcome: Progressing   Problem: RH COGNITION-NURSING Goal: RH STG USES MEMORY AIDS/STRATEGIES W/ASSIST TO PROBLEM SOLVE Description STG Uses Memory Aids/Strategies With Assistance to Problem Solve. Outcome: Progressing Goal: RH STG ANTICIPATES NEEDS/CALLS FOR ASSIST W/ASSIST/CUES Description STG Anticipates Needs/Calls for Assist With Assistance/Cues. Outcome: Progressing   Problem: RH PAIN MANAGEMENT Goal: RH STG PAIN MANAGED AT OR BELOW PT'S PAIN GOAL Outcome: Progressing Goal: RH OTHER STG PAIN MANAGEMENT GOALS W/ASSIST Description Other STG Pain Management Goals With Assistance. Outcome: Progressing   Problem: RH KNOWLEDGE DEFICIT Goal: RH STG INCREASE KNOWLEDGE OF DIABETES Outcome: Progressing Goal: RH STG INCREASE KNOWLEDGE OF HYPERTENSION Outcome: Progressing Goal: RH STG INCREASE KNOWLEDGE OF DYSPHAGIA/FLUID INTAKE Outcome: Progressing

## 2017-11-07 NOTE — Progress Notes (Signed)
Occupational Therapy Session Note  Patient Details  Name: [name] MRN: [id number] Date of Birth: [birth year]  Today's Date: 11/07/2017 OT Individual Time: 0800-0900 OT Individual Time Calculation (min): 60 min    Short Term Goals: Week 1:  OT Short Term Goal 1 (Week 1): Pt will sit to stand with min A to advance pants past hips wiht LRAD OT Short Term Goal 2 (Week 1): Pt will don socks with AE PRN and supervision OT Short Term Goal 3 (Week 1): Pt will groom at sink in standing for 2 items to demo improved standing endurance OT Short Term Goal 4 (Week 1): Pt will complete toilet transfer with MIN A and LRAD OT Short Term Goal 5 (Week 1): Pt will don shirt with supervision  Skilled Therapeutic Interventions/Progress Updates:    1;1. Pt set up with breakfast from RN. No c/o pain. OT facilitates sit to supine pt unable to problem solve sequencing. Pt sits EOB with supervision initially stabilizing with LUE on table until OT encourages toput hand in lap for more core activation. Pt demo difficulty navigating arm movements/organization of cups on tray as demonstrated by knocking and spilling some coffee over. Pt requires VC to open hand up wider to grasp cup with RUE as pt leaves smaller 2 digits flexed. Pt demo delay in stabilizing water cup with second hand and almost spills as well. Pt brushes hair, teeth and washes face at sink seated in w/c. Pt requires cuing for sequencing of oral care such as applieing toothpaste and problem solving how to turn on water without it splashing on arm. Pt able to terminate water this date without cues. Pt able to doff shirt this date, orient shirt and don with without A except for gathering. Pt often questions for sequence of familiar tasks such as doffing pants and OT provides general cuese to "do it like you would do at home. Pt dons pants with steady A while advancing pants past hips. Pt benefitted from counting to initiate sit to stand. Exited session with pt  seated in bed, call light in reach and exit alarm on.  Therapy Documentation Precautions:  Precautions Precautions: Fall Precaution Comments: 2L O2 via [place]; h/o CA and several falls Restrictions Weight Bearing Restrictions: No General:   Vital Signs: Therapy Vitals Pulse Rate: 63 Resp: 20 BP: (!) 142/74 Patient Position (if appropriate): Sitting Oxygen Therapy SpO2: 97 % O2 Device: Nasal Cannula O2 Flow Rate (L/min): 2 L/min  See Function Navigator for Current Functional Status.   Therapy/Group: Individual Therapy  Tonny Branch 11/07/2017, 8:31 AM

## 2017-11-07 NOTE — Progress Notes (Signed)
Subjective/Complaints: Patient feels well.  He has no complaints.  She has been working with therapy and standing.  Objective: Vital Signs: Blood pressure (!) 142/74, pulse 63, temperature 97.9 F (36.6 C), temperature source Oral, resp. rate 20, height 5\' 1"  (1.549 m), weight 134 lb 0.6 oz (60.8 kg), SpO2 97 %.  Elderly female in no acute distress. HEENT exam atraumatic, normocephalic.  She is wearing oxygen. Neck is supple Cardiac exam S1 and S2 are regular. Abdominal exam active bowel sounds, soft. Chest clear to auscultation  Assessment/Plan: 1. Functional deficits secondary to RIght hemiataxia due Right cerebellar ICH  Medical Problem List and Plan: 1.Altered mental status with lethargysecondary to right cerebellar parenchymal hemorrhage felt to be secondary to hypertensive crisis   2. DVT Prophylaxis/Anticoagulation: Subcutaneous Lovenox 30 mg daily initiated 11/01/2017 3. Pain Management:Tylenol as needed 4. Mood:Provide emotional support 5. Neuropsych: This patientiscapable of making decisions on herown behalf. 6. Skin/Wound Care:Routine skin checks 7. 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  She is not a candidate for bypass surgery.  We will continue sitter PCI intervention as an outpatient.  Continue aspirin.  9.Systolic congestive heart failure.  No signs or  symptoms of congestive heart failure at this time. 10.Hypertension.  See blood pressures below. Fair control for now.  Continue current medications.  Will increase carvedilol to 25 mg p.o. twice daily. Vitals:   11/06/17 1958 11/07/17 0519  BP: (!) 151/75 (!) 142/74  Pulse: 67 63  Resp: 18 20  Temp: 97.9 F (36.6 C)   SpO2: 93% 97%   11.Type 2 diabetes.  Continue current medications.  Monitor CBGs. CBG (last 3)  Recent Labs    11/06/17 1637 11/06/17 2118 11/07/17 0634  GLUCAP 157* 157* 110*  Continue current medications for now. 12.History of breast, colon, endometrial and cervical cancer. Continue hydroxyurea  LOS (Days) 3 A FACE TO FACE EVALUATION WAS PERFORMED  Avea Mcgowen H Briseidy Spark 11/07/2017, 9:40 AM

## 2017-11-08 ENCOUNTER — Inpatient Hospital Stay (HOSPITAL_COMMUNITY): Payer: Medicare Other

## 2017-11-08 ENCOUNTER — Inpatient Hospital Stay (HOSPITAL_COMMUNITY): Payer: Medicare Other | Admitting: Physical Therapy

## 2017-11-08 ENCOUNTER — Inpatient Hospital Stay (HOSPITAL_COMMUNITY): Payer: Medicare Other | Admitting: Occupational Therapy

## 2017-11-08 DIAGNOSIS — E1165 Type 2 diabetes mellitus with hyperglycemia: Secondary | ICD-10-CM

## 2017-11-08 LAB — GLUCOSE, CAPILLARY
Glucose-Capillary: 158 mg/dL — ABNORMAL HIGH (ref 65–99)
Glucose-Capillary: 156 mg/dL — ABNORMAL HIGH (ref 65–99)
Glucose-Capillary: 190 mg/dL — ABNORMAL HIGH (ref 65–99)
Glucose-Capillary: 176 mg/dL — ABNORMAL HIGH (ref 65–99)

## 2017-11-08 MED ORDER — INSULIN DETEMIR 100 UNIT/ML ~~LOC~~ SOLN
8.0000 [IU] | Freq: Every day | SUBCUTANEOUS | Status: DC
  Administered 2017-11-08 – 2017-11-16 (×9): 8 [IU] via SUBCUTANEOUS
  Filled 2017-11-08 (×10): qty 0.08

## 2017-11-08 NOTE — Progress Notes (Signed)
Subjective/Complaints: Patient states she is not feeling well this morning.  I asked her why and she stated that she could not sleep much last night and just was restless.  She feels tired more than anything else.  She denies pain.  ROS: Patient denies fever, rash, sore throat, blurred vision, nausea, vomiting, diarrhea, cough, shortness of breath or chest pain, joint or back pain, headache, or mood change.     Objective: Vital Signs: Blood pressure (!) 148/79, pulse 60, temperature (!) 97.5 F (36.4 C), temperature source Oral, resp. rate 18, height 5\' 1"  (1.549 m), weight 60.8 kg (134 lb 0.6 oz), SpO2 95 %. No results found. Results for orders placed or performed during the hospital encounter of 11/04/17 (from the past 72 hour(s))  Glucose, capillary     Status: Abnormal   Collection Time: 11/05/17 11:33 AM  Result Value Ref Range   Glucose-Capillary 137 (H) 65 - 99 mg/dL  Glucose, capillary     Status: Abnormal   Collection Time: 11/05/17  5:14 PM  Result Value Ref Range   Glucose-Capillary 150 (H) 65 - 99 mg/dL  Glucose, capillary     Status: Abnormal   Collection Time: 11/05/17  9:19 PM  Result Value Ref Range   Glucose-Capillary 173 (H) 65 - 99 mg/dL  Glucose, capillary     Status: None   Collection Time: 11/06/17  6:23 AM  Result Value Ref Range   Glucose-Capillary 99 65 - 99 mg/dL  Glucose, capillary     Status: Abnormal   Collection Time: 11/06/17 11:42 AM  Result Value Ref Range   Glucose-Capillary 148 (H) 65 - 99 mg/dL  Glucose, capillary     Status: Abnormal   Collection Time: 11/06/17  4:37 PM  Result Value Ref Range   Glucose-Capillary 157 (H) 65 - 99 mg/dL  Glucose, capillary     Status: Abnormal   Collection Time: 11/06/17  9:18 PM  Result Value Ref Range   Glucose-Capillary 157 (H) 65 - 99 mg/dL  Glucose, capillary     Status: Abnormal   Collection Time: 11/07/17  6:34 AM  Result Value Ref Range   Glucose-Capillary 110 (H) 65 - 99 mg/dL  Glucose,  capillary     Status: Abnormal   Collection Time: 11/07/17 11:57 AM  Result Value Ref Range   Glucose-Capillary 191 (H) 65 - 99 mg/dL  Glucose, capillary     Status: Abnormal   Collection Time: 11/07/17  5:09 PM  Result Value Ref Range   Glucose-Capillary 185 (H) 65 - 99 mg/dL  Glucose, capillary     Status: Abnormal   Collection Time: 11/07/17  9:28 PM  Result Value Ref Range   Glucose-Capillary 190 (H) 65 - 99 mg/dL  Glucose, capillary     Status: Abnormal   Collection Time: 11/08/17  6:50 AM  Result Value Ref Range   Glucose-Capillary 158 (H) 65 - 99 mg/dL     Constitutional: No distress . Vital signs reviewed. HEENT: EOMI, oral membranes moist Neck: supple Cardiovascular: RRR without murmur. No JVD    Respiratory: CTA Bilaterally without wheezes or rales. Normal effort    GI: BS +, non-tender, non-distended  Skin:   Other PICC site is clean, no erythema Neuro: Alert/Oriented, Abnormal Motor 4/5 in RUE and RLE , 5/5 on Left side and Abnormal FMC Ataxic/ dec FMC. Speech dysarthric Musc/Skel:  Other No pain with UE or LE ROM Gen NAD   Assessment/Plan: 1. Functional deficits secondary to RIght hemiataxia due Right  cerebellar ICH which require 3+ hours per day of interdisciplinary therapy in a comprehensive inpatient rehab setting. Physiatrist is providing close team supervision and 24 hour management of active medical problems listed below. Physiatrist and rehab team continue to assess barriers to discharge/monitor patient progress toward functional and medical goals. FIM: Function - Bathing Position: Wheelchair/chair at sink Body parts bathed by patient: Right arm, Left arm, Chest, Abdomen, Front perineal area, Right upper leg, Left upper leg, Right lower leg, Left lower leg Body parts bathed by helper: Buttocks, Back Assist Level: Touching or steadying assistance(Pt > 75%)  Function- Upper Body Dressing/Undressing What is the patient wearing?: Pull over shirt/dress Bra -  Perfomed by patient: Thread/unthread right bra strap, Thread/unthread left bra strap Bra - Perfomed by helper: Hook/unhook bra (pull down sports bra) Pull over shirt/dress - Perfomed by patient: Thread/unthread right sleeve, Thread/unthread left sleeve, Put head through opening, Pull shirt over trunk Pull over shirt/dress - Perfomed by helper: Pull shirt over trunk Assist Level: Touching or steadying assistance(Pt > 75%) Function - Lower Body Dressing/Undressing What is the patient wearing?: Pants Pants- Performed by patient: Thread/unthread right pants leg, Thread/unthread left pants leg, Pull pants up/down Pants- Performed by helper: Pull pants up/down Non-skid slipper socks- Performed by helper: Don/doff right sock, Don/doff left sock Assist for lower body dressing: Touching or steadying assistance (Pt > 75%)  Function - Toileting Toileting activity did not occur: (Bedpan) Toileting steps completed by helper: Adjust clothing prior to toileting, Performs perineal hygiene, Adjust clothing after toileting Toileting Assistive Devices: Grab bar or rail Assist level: More than reasonable time, Touching or steadying assistance (Pt.75%)  Function - Air cabin crew transfer assistive device: Grab bar Assist level to toilet: Touching or steadying assistance (Pt > 75%) Assist level from toilet: Touching or steadying assistance (Pt > 75%)  Function - Chair/bed transfer Chair/bed transfer method: Stand pivot Chair/bed transfer assist level: Maximal assist (Pt 25 - 49%/lift and lower) Chair/bed transfer assistive device: Armrests  Function - Locomotion: Wheelchair Type: Manual Assist Level: Dependent (Pt equals 0%) Assist Level: Dependent (Pt equals 0%) Assist Level: Dependent (Pt equals 0%) Turns around,maneuvers to table,bed, and toilet,negotiates 3% grade,maneuvers on rugs and over doorsills: No Function - Locomotion: Ambulation Max distance: 5' Assist level: Moderate assist (Pt  50 - 74%) Walk 10 feet activity did not occur: Safety/medical concerns Walk 50 feet with 2 turns activity did not occur: Safety/medical concerns Walk 150 feet activity did not occur: Safety/medical concerns Walk 10 feet on uneven surfaces activity did not occur: Safety/medical concerns  Function - Comprehension Comprehension: Auditory Comprehension assist level: Follows basic conversation/direction with no assist  Function - Expression Expression: Verbal Expression assist level: Expresses basic 50 - 74% of the time/requires cueing 25 - 49% of the time. Needs to repeat parts of sentences.  Function - Social Interaction Social Interaction assist level: Interacts appropriately 90% of the time - Needs monitoring or encouragement for participation or interaction.  Function - Problem Solving Problem solving assist level: Solves basic 75 - 89% of the time/requires cueing 10 - 24% of the time  Function - Memory Memory assist level: Recognizes or recalls 50 - 74% of the time/requires cueing 25 - 49% of the time Patient normally able to recall (first 3 days only): Current season, Location of own room, That he or she is in a hospital, Staff names and faces  Medical Problem List and Plan: 1.Altered mental status with lethargysecondary to right cerebellar parenchymal hemorrhage felt to be secondary to  hypertensive crisis  PT, OT, SLP ongoing 2. DVT Prophylaxis/Anticoagulation: Subcutaneous Lovenox 30 mg daily initiated 11/01/2017 3. Pain Management:Tylenol as needed 4. Mood:Provided pt positive emotional support.  5. Neuropsych: This patientiscapable of making decisions on herown behalf. 6. Skin/Wound Care:Routine skin checks 7. 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 Not a candidate for CABG. Patient cleared to begin aspirin 81 mg daily 11/05/2017.Plan for PCI as an outpatient  follow-up 9.Decompensated systolic congestive heart failure. Monitor for any signs of fluid overload. Lasix 40 mg daily  -need to record weights Filed Weights   11/04/17 1832 11/04/17 2201  Weight: 60.8 kg (134 lb 0.6 oz) 60.8 kg (134 lb 0.6 oz)    10.Hypertension. Coreg 12.5 mg twice daily, Altace 10 mg twice daily, Aldactone 25 mg daily, Nitro-Dur patch 0.2 mg daily Vitals:   11/07/17 2130 11/08/17 0446  BP: 140/62 (!) 148/79  Pulse: 67 60  Resp: 18 18  Temp: 97.7 F (36.5 C) (!) 97.5 F (36.4 C)  SpO2: 93% 95%  controlled 5/20 11.Diabetes mellitus with peripheral neuropathy. Levemir 6 units nightly. Checking blood sugars before meals and at bedtime CBG (last 3)  Recent Labs    11/07/17 1709 11/07/17 2128 11/08/17 0650  GLUCAP 185* 190* 158*  borderline control---increase levemir to 8u tonight 12.History of breast, colon, endometrial and cervical cancer. Continue hydroxyurea  LOS (Days) 4 A FACE TO FACE EVALUATION WAS PERFORMED  Meredith Staggers 11/08/2017, 11:10 AM

## 2017-11-08 NOTE — Progress Notes (Signed)
Physical Therapy Session Note  Patient Details  Name: [name] MRN: [id number] Date of Birth: 1931-11-21  Today's Date: 11/08/2017 PT Individual Time: 4097-3532 PT Individual Time Calculation (min): 56 min   Short Term Goals: Week 1:  PT Short Term Goal 1 (Week 1): Pt will be able to perform functional transfers consistently with min assist PT Short Term Goal 2 (Week 1): Pt will be able to gait with LRAD x 75' with min assist PT Short Term Goal 3 (Week 1): Pt will be able to perform 4 steps with bilateral rails with min assist for strengthening and increased muscular endurance  Skilled Therapeutic Interventions/Progress Updates:  Pt received on toilet with continent BM; therapist provided total assist for peri hygiene. Pt completes sit>stand (cuing to use grab bar) and maintains standing balance with RW & min assist. Pt ambulates to bed with RW and min assist. Therapist observed pt's oxygen tank to be on 1L/min but empty & quickly hooked tubing up to oxygen tank on wall. SpO2 = 98-100% on 1L/min sitting on EOB. Pt reports fatigue and requests to lie down. Pt transfers sit>supine with min assist and requires max assist to scoot to [facility] with bed in trendelenburg position (pt unable to pull with UE 2/2 pain and with decreased ability to push through LE). After prolonged rest break pt agreeable to bed level exercises. Pt performed the following exercises with therapist providing multimodal cuing for proper technique: heel slides, hip adduction pillow squeezes, straight leg raises, hip abduction slides, and short arc quads. Pt maintained eyes closed throughout exercises. Pt reports she lives alone and has no family close by (per report her family lives in [place]). Educated pt on need for assistance upon d/c with pt stating "I'll have somebody" but unable to state who.  Pt's niece [name]) arrived towards end of session & confirms pt's family does live in [place] and actually  [place]. Educated [name] & pt on weekly interdisciplinary team meeting and recommendation of 24 hr supervision at d/c. At end of session pt left in bed with alarm set, needs within reach, & family & visitor present.  At end of session pt on 1L/min supplemental oxygen, SpO2 = 97%, HR = 60 bpm.  Therapy Documentation Precautions:  Precautions Precautions: Fall Precaution Comments: 2L O2 via [place]; h/o CA and several falls Restrictions Weight Bearing Restrictions: No    See Function Navigator for Current Functional Status.   Therapy/Group: Individual Therapy  [name] 11/08/2017, 12:27 PM

## 2017-11-08 NOTE — Progress Notes (Signed)
Speech Language Pathology Daily Session Note  Patient Details  Name: [name] MRN: [id number] Date of Birth: [birth year]  Today's Date: 11/08/2017 SLP Individual Time: 0900-1000 SLP Individual Time Calculation (min): 60 min  Short Term Goals: Week 1: SLP Short Term Goal 1 (Week 1): Pt will consume trials of regular solids with supervision cues for use of swallowing precautions and no observable signs of fatigue or changes in respiratory status.   SLP Short Term Goal 2 (Week 1): Pt will overarticulate and increase vocal intensity to achieve intelligibility at the sentence level with min cues for use of swallowing precautions.    Skilled Therapeutic Interventions: Skilled ST services focused on speech and swallow skills. SLP facilitated speech intelligibility strategies utilizing visual aid to recall strategies, pt required mod A cues fading to max A verbal cues due to fatigue as session continued in unstructured sentence tasks, and in structured sentence tasks mod-min A verbal cues. SLP facilitated PO consumption of regular textured food trials with no signs of fatigue or change in respiratory statues, however declined further trials due to dry mouth. Pt explained that dry mouth is a condition believed to be caused by medication which began 6-8 months ago, however pt unable to recall which medication or if pt has asked doctor about possibility to switch medication. Pt was left in room with call bell within reach. Recommend to continue skilled ST services.     Function:  Eating Eating   Modified Consistency Diet: No(regular trials) Eating Assist Level: Supervision or verbal cues;Set up assist for   Eating Set Up Assist For: Opening containers       Cognition Comprehension Comprehension assist level: Follows basic conversation/direction with no assist  Expression   Expression assist level: Expresses basic 50 - 74% of the time/requires cueing 25 - 49% of the time. Needs to repeat parts  of sentences.;Expresses basic 75 - 89% of the time/requires cueing 10 - 24% of the time. Needs helper to occlude trach/needs to repeat words.  Social Interaction Social Interaction assist level: Interacts appropriately 90% of the time - Needs monitoring or encouragement for participation or interaction.  Problem Solving Problem solving assist level: Solves basic 75 - 89% of the time/requires cueing 10 - 24% of the time  Memory Memory assist level: Recognizes or recalls 50 - 74% of the time/requires cueing 25 - 49% of the time    Pain Pain Assessment Pain Score: 0-No pain  Therapy/Group: Individual Therapy  Shriley Joffe  [facility] 11/08/2017, 3:18 PM

## 2017-11-08 NOTE — Progress Notes (Signed)
Occupational Therapy Session Note  Patient Details  Name: [name] MRN: [id number] Date of Birth: Feb 19, 1932  Today's Date: 11/08/2017 OT Individual Time: 1300-1410 OT Individual Time Calculation (min): 70 min    Short Term Goals: Week 1:  OT Short Term Goal 1 (Week 1): Pt will sit to stand with min A to advance pants past hips wiht LRAD OT Short Term Goal 2 (Week 1): Pt will don socks with AE PRN and supervision OT Short Term Goal 3 (Week 1): Pt will groom at sink in standing for 2 items to demo improved standing endurance OT Short Term Goal 4 (Week 1): Pt will complete toilet transfer with MIN A and LRAD OT Short Term Goal 5 (Week 1): Pt will don shirt with supervision  Skilled Therapeutic Interventions/Progress Updates:    Pt greeted semi-reclined in bed after finishing lunch and agreeable to OT treatment session. Pt on 1L of O2 throughout session. Pt came to sitting EOB with increased time and min A. Rest break after bed mobility, then stand-step turn to wc with RW and min A. VC for hand placement with sit<>stand. Pt brought to the sink and worked on standing balance and endurance with standing bathing tasks. Pt tolerated standing for 2 mins while washing buttocks and peri-area- min guard A for balance. Pt needed extended rest breaks within all BADL tasks. Dressing completed with overall min A and increased time. Pt completed 2 mins x 3 on [employer] arm bike with 2-3 minute rest breaks in between sets. Pt returned to room and requested to use the bathroom. Stand-pivot with min/mod A. Assistance to manage clothing. Pt returned to bed at end of session in similar fashion. Bed alarm on and needs met.  Therapy Documentation Precautions:  Precautions Precautions: Fall Precaution Comments: 2L O2 via [place]; h/o CA and several falls Restrictions Weight Bearing Restrictions: No Pain: None/denies pain See Function Navigator for Current Functional Status.   Therapy/Group: Individual  Therapy  Valma Cava 11/08/2017, 2:12 PM

## 2017-11-08 NOTE — Plan of Care (Signed)
Problem: Consults Goal: RH STROKE PATIENT EDUCATION Description See Patient Education module for education specifics  Outcome: Progressing Goal: Nutrition Consult-if indicated Outcome: Progressing Goal: Diabetes Guidelines if Diabetic/Glucose > 140 Description If diabetic or lab glucose is > 140 mg/dl - Initiate Diabetes/Hyperglycemia Guidelines & Document Interventions  Outcome: Progressing   Problem: RH BOWEL ELIMINATION Goal: RH STG MANAGE BOWEL WITH ASSISTANCE Description STG Manage Bowel with Assistance. Outcome: Progressing Flowsheets (Taken 11/08/2017 1403) STG: Pt will manage bowels with assistance: 6-Modified independent Goal: RH STG MANAGE BOWEL W/MEDICATION W/ASSISTANCE Description STG Manage Bowel with Medication with Assistance. Outcome: Progressing Flowsheets (Taken 11/08/2017 1403) STG: Pt will manage bowels with medication with assistance: 6-Modified independent Goal: RH STG MANAGE BOWEL W/EQUIPMENT W/ASSISTANCE Description STG Manage Bowel With Equipment With Assistance Outcome: Progressing Flowsheets (Taken 11/08/2017 1403) STG: Pt will manage bowels with equipment with assistance: 6-Modified independent Goal: RH OTHER STG BOWEL ELIMINATION GOALS W/ASSIST Description Other STG Bowel Elimination Goals With Assistance. Outcome: Progressing   Problem: RH BLADDER ELIMINATION Goal: RH STG MANAGE BLADDER WITH ASSISTANCE Description STG Manage Bladder With Assistance Outcome: Progressing Goal: RH STG MANAGE BLADDER WITH MEDICATION WITH ASSISTANCE Description STG Manage Bladder With Medication With Assistance. Outcome: Progressing Flowsheets (Taken 11/08/2017 1403) STG: Pt will manage bladder with medication with assistance: 6-Modified independent Goal: RH STG MANAGE BLADDER WITH EQUIPMENT WITH ASSISTANCE Description STG Manage Bladder With Equipment With Assistance Outcome: Progressing Flowsheets (Taken 11/08/2017 1403) STG: Pt will manage bladder with  equipment with assistance: 6-Modified independent Goal: RH OTHER STG BLADDER ELIMINATION GOALS W/ASSIST Description Other STG Bladder Elimination Goals With Assistance Outcome: Progressing   Problem: RH SKIN INTEGRITY Goal: RH STG MAINTAIN SKIN INTEGRITY WITH ASSISTANCE Description STG Maintain Skin Integrity With Assistance. Outcome: Progressing Flowsheets (Taken 11/08/2017 1403) STG: Maintain skin integrity with assistance: 4-Minimal assistance Goal: RH STG ABLE TO PERFORM INCISION/WOUND CARE W/ASSISTANCE Description STG Able To Perform Incision/Wound Care With Assistance. Outcome: Progressing Flowsheets (Taken 11/08/2017 1403) STG: Pt will be able to perform incision/wound care with assistance: 4-Minimal assistance Goal: RH OTHER STG SKIN INTEGRITY GOALS W/ASSIST Description Other STG Skin Integrity Goals With Assistance. Outcome: Progressing   Problem: RH SAFETY Goal: RH STG ADHERE TO SAFETY PRECAUTIONS W/ASSISTANCE/DEVICE Description STG Adhere to Safety Precautions With Assistance/Device. Outcome: Progressing Flowsheets (Taken 11/08/2017 1403) STG:Pt will adhere to safety precautions with assistance/device: 4-Minimal assistance Goal: RH STG DECREASED RISK OF FALL WITH ASSISTANCE Description STG Decreased Risk of Fall With Assistance. Outcome: Progressing Flowsheets (Taken 11/08/2017 1403) QIO:[id number] risk of fall  with assistance/device: 4-Minimal assistance Goal: RH STG DEMO UNDERSTANDING HOME SAFETY PRECAUTIONS Outcome: Progressing Goal: RH OTHER STG SAFETY GOALS W/ASSIST Description Other STG Safety Goals With Assistance. Outcome: Progressing   Problem: RH COGNITION-NURSING Goal: RH STG USES MEMORY AIDS/STRATEGIES W/ASSIST TO PROBLEM SOLVE Description STG Uses Memory Aids/Strategies With Assistance to Problem Solve. Outcome: Progressing Flowsheets (Taken 11/08/2017 1403) STG: Uses memory aids/strategies with assistance: 4-Minimal assistance Goal: RH STG  ANTICIPATES NEEDS/CALLS FOR ASSIST W/ASSIST/CUES Description STG Anticipates Needs/Calls for Assist With Assistance/Cues. Outcome: Progressing Flowsheets (Taken 11/08/2017 1403) STG: Anticipates needs/calls for assistance with assistance/cues: 4-Minimal assistance   Problem: RH PAIN MANAGEMENT Goal: RH STG PAIN MANAGED AT OR BELOW PT'S PAIN GOAL Outcome: Progressing Goal: RH OTHER STG PAIN MANAGEMENT GOALS W/ASSIST Description Other STG Pain Management Goals With Assistance. Outcome: Progressing   Problem: RH KNOWLEDGE DEFICIT Goal: RH STG INCREASE KNOWLEDGE OF DIABETES Outcome: Progressing Goal: RH STG INCREASE KNOWLEDGE OF HYPERTENSION Outcome: Progressing Goal: RH STG INCREASE KNOWLEDGE OF DYSPHAGIA/FLUID INTAKE  Outcome: Progressing   

## 2017-11-09 ENCOUNTER — Inpatient Hospital Stay (HOSPITAL_COMMUNITY): Payer: Medicare Other | Admitting: Occupational Therapy

## 2017-11-09 ENCOUNTER — Inpatient Hospital Stay (HOSPITAL_COMMUNITY): Payer: Medicare Other

## 2017-11-09 ENCOUNTER — Inpatient Hospital Stay (HOSPITAL_COMMUNITY): Payer: Medicare Other | Admitting: Physical Therapy

## 2017-11-09 LAB — CBC
HCT: 40 % (ref 36.0–46.0)
Hemoglobin: 12.2 g/dL (ref 12.0–15.0)
MCH: 26.7 pg (ref 26.0–34.0)
MCHC: 30.5 g/dL (ref 30.0–36.0)
MCV: 87.5 fL (ref 78.0–100.0)
Platelets: 589 10*3/uL — ABNORMAL HIGH (ref 150–400)
RBC: 4.57 MIL/uL (ref 3.87–5.11)
RDW: 18 % — ABNORMAL HIGH (ref 11.5–15.5)
WBC: 16 10*3/uL — ABNORMAL HIGH (ref 4.0–10.5)

## 2017-11-09 LAB — URINALYSIS, ROUTINE W REFLEX MICROSCOPIC
Bilirubin Urine: NEGATIVE
Glucose, UA: NEGATIVE mg/dL
Hgb urine dipstick: NEGATIVE
Ketones, ur: NEGATIVE mg/dL
Leukocytes, UA: NEGATIVE
Nitrite: NEGATIVE
Protein, ur: NEGATIVE mg/dL
Specific Gravity, Urine: 1.003 — ABNORMAL LOW (ref 1.005–1.030)
pH: 6 (ref 5.0–8.0)

## 2017-11-09 LAB — BASIC METABOLIC PANEL
Anion gap: 11 (ref 5–15)
BUN: 11 mg/dL (ref 6–20)
CO2: 29 mmol/L (ref 22–32)
Calcium: 8.7 mg/dL — ABNORMAL LOW (ref 8.9–10.3)
Chloride: 100 mmol/L — ABNORMAL LOW (ref 101–111)
Creatinine, Ser: 1.07 mg/dL — ABNORMAL HIGH (ref 0.44–1.00)
GFR calc Af Amer: 54 mL/min — ABNORMAL LOW (ref >60)
GFR calc non Af Amer: 47 mL/min — ABNORMAL LOW (ref >60)
Glucose, Bld: 189 mg/dL — ABNORMAL HIGH (ref 65–99)
Potassium: 3.3 mmol/L — ABNORMAL LOW (ref 3.5–5.1)
Sodium: 140 mmol/L (ref 135–145)

## 2017-11-09 LAB — GLUCOSE, CAPILLARY
Glucose-Capillary: 139 mg/dL — ABNORMAL HIGH (ref 65–99)
Glucose-Capillary: 160 mg/dL — ABNORMAL HIGH (ref 65–99)
Glucose-Capillary: 154 mg/dL — ABNORMAL HIGH (ref 65–99)
Glucose-Capillary: 160 mg/dL — ABNORMAL HIGH (ref 65–99)

## 2017-11-09 MED ORDER — ONDANSETRON 4 MG PO TBDP
4.0000 mg | ORAL_TABLET | Freq: Once | ORAL | Status: AC
  Administered 2017-11-09: 4 mg via ORAL
  Filled 2017-11-09: qty 1

## 2017-11-09 NOTE — Progress Notes (Signed)
Subjective/Complaints:  No issues overnite, poor appetite this am. No abd pain, bowels reportedly moving , last documented BM was 5/20  ROS: Patient deniesN/V/D, SOB, CP jt pain   Objective: Vital Signs: Blood pressure (!) 165/85, pulse 67, temperature (!) 97.4 F (36.3 C), temperature source Oral, resp. rate 18, height 5\' 1"  (1.549 m), weight 60.8 kg (134 lb 0.6 oz), SpO2 96 %. No results found. Results for orders placed or performed during the hospital encounter of 11/04/17 (from the past 72 hour(s))  Glucose, capillary     Status: Abnormal   Collection Time: 11/06/17 11:42 AM  Result Value Ref Range   Glucose-Capillary 148 (H) 65 - 99 mg/dL  Glucose, capillary     Status: Abnormal   Collection Time: 11/06/17  4:37 PM  Result Value Ref Range   Glucose-Capillary 157 (H) 65 - 99 mg/dL  Glucose, capillary     Status: Abnormal   Collection Time: 11/06/17  9:18 PM  Result Value Ref Range   Glucose-Capillary 157 (H) 65 - 99 mg/dL  Glucose, capillary     Status: Abnormal   Collection Time: 11/07/17  6:34 AM  Result Value Ref Range   Glucose-Capillary 110 (H) 65 - 99 mg/dL  Glucose, capillary     Status: Abnormal   Collection Time: 11/07/17 11:57 AM  Result Value Ref Range   Glucose-Capillary 191 (H) 65 - 99 mg/dL  Glucose, capillary     Status: Abnormal   Collection Time: 11/07/17  5:09 PM  Result Value Ref Range   Glucose-Capillary 185 (H) 65 - 99 mg/dL  Glucose, capillary     Status: Abnormal   Collection Time: 11/07/17  9:28 PM  Result Value Ref Range   Glucose-Capillary 190 (H) 65 - 99 mg/dL  Glucose, capillary     Status: Abnormal   Collection Time: 11/08/17  6:50 AM  Result Value Ref Range   Glucose-Capillary 158 (H) 65 - 99 mg/dL  Glucose, capillary     Status: Abnormal   Collection Time: 11/08/17 11:55 AM  Result Value Ref Range   Glucose-Capillary 156 (H) 65 - 99 mg/dL  Glucose, capillary     Status: Abnormal   Collection Time: 11/08/17  4:59 PM  Result Value  Ref Range   Glucose-Capillary 190 (H) 65 - 99 mg/dL  Glucose, capillary     Status: Abnormal   Collection Time: 11/08/17  9:42 PM  Result Value Ref Range   Glucose-Capillary 176 (H) 65 - 99 mg/dL  Glucose, capillary     Status: Abnormal   Collection Time: 11/09/17  6:52 AM  Result Value Ref Range   Glucose-Capillary 139 (H) 65 - 99 mg/dL     Constitutional: No distress . Vital signs reviewed. HEENT: EOMI, oral membranes moist Neck: supple Cardiovascular: RRR without murmur. No JVD    Respiratory: CTA Bilaterally without wheezes or rales. Normal effort    GI: BS +, non-tender, non-distended  Skin:   Other PICC site is clean, no erythema Neuro: Alert/Oriented, Abnormal Motor 4/5 in RUE and RLE , 5/5 on Left side and Abnormal FMC Ataxic/ dec FMC. Speech dysarthric Musc/Skel:  Other No pain with UE or LE ROM Gen NAD   Assessment/Plan: 1. Functional deficits secondary to RIght hemiataxia due Right cerebellar ICH which require 3+ hours per day of interdisciplinary therapy in a comprehensive inpatient rehab setting. Physiatrist is providing close team supervision and 24 hour management of active medical problems listed below. Physiatrist and rehab team continue to assess barriers to  discharge/monitor patient progress toward functional and medical goals. FIM: Function - Bathing Position: Wheelchair/chair at sink Body parts bathed by patient: Right arm, Left arm, Chest, Abdomen, Front perineal area, Right upper leg, Left upper leg, Right lower leg, Left lower leg Body parts bathed by helper: Buttocks, Back Assist Level: Touching or steadying assistance(Pt > 75%)  Function- Upper Body Dressing/Undressing What is the patient wearing?: Pull over shirt/dress Bra - Perfomed by patient: Thread/unthread right bra strap, Thread/unthread left bra strap Bra - Perfomed by helper: Hook/unhook bra (pull down sports bra) Pull over shirt/dress - Perfomed by patient: Thread/unthread right sleeve,  Thread/unthread left sleeve, Put head through opening Pull over shirt/dress - Perfomed by helper: Pull shirt over trunk Assist Level: Touching or steadying assistance(Pt > 75%) Function - Lower Body Dressing/Undressing What is the patient wearing?: Pants, Underwear Position: Wheelchair/chair at sink Underwear - Performed by patient: Thread/unthread right underwear leg Underwear - Performed by helper: Thread/unthread left underwear leg, Pull underwear up/down Pants- Performed by patient: Thread/unthread right pants leg, Thread/unthread left pants leg, Pull pants up/down Pants- Performed by helper: Pull pants up/down Non-skid slipper socks- Performed by helper: Don/doff right sock, Don/doff left sock Assist for footwear: Dependant Assist for lower body dressing: Touching or steadying assistance (Pt > 75%)  Function - Toileting Toileting activity did not occur: No continent bowel/bladder event Toileting steps completed by patient: Adjust clothing prior to toileting, Adjust clothing after toileting, Performs perineal hygiene Toileting steps completed by helper: Adjust clothing prior to toileting, Performs perineal hygiene, Adjust clothing after toileting Toileting Assistive Devices: Grab bar or rail Assist level: More than reasonable time, Touching or steadying assistance (Pt.75%)  Function - Air cabin crew transfer assistive device: Grab bar Assist level to toilet: Touching or steadying assistance (Pt > 75%) Assist level from toilet: Touching or steadying assistance (Pt > 75%)  Function - Chair/bed transfer Chair/bed transfer method: Ambulatory Chair/bed transfer assist level: Touching or steadying assistance (Pt > 75%) Chair/bed transfer assistive device: Walker  Function - Locomotion: Wheelchair Type: Manual Assist Level: Dependent (Pt equals 0%) Assist Level: Dependent (Pt equals 0%) Assist Level: Dependent (Pt equals 0%) Turns around,maneuvers to table,bed, and  toilet,negotiates 3% grade,maneuvers on rugs and over doorsills: No Function - Locomotion: Ambulation Assistive device: Walker-rolling Max distance: 10 ft Assist level: Touching or steadying assistance (Pt > 75%) Walk 10 feet activity did not occur: Safety/medical concerns Assist level: Touching or steadying assistance (Pt > 75%) Walk 50 feet with 2 turns activity did not occur: Safety/medical concerns Walk 150 feet activity did not occur: Safety/medical concerns Walk 10 feet on uneven surfaces activity did not occur: Safety/medical concerns  Function - Comprehension Comprehension: Auditory Comprehension assist level: Follows basic conversation/direction with no assist  Function - Expression Expression: Verbal Expression assist level: Expresses basic 50 - 74% of the time/requires cueing 25 - 49% of the time. Needs to repeat parts of sentences., Expresses basic 75 - 89% of the time/requires cueing 10 - 24% of the time. Needs helper to occlude trach/needs to repeat words.  Function - Social Interaction Social Interaction assist level: Interacts appropriately 90% of the time - Needs monitoring or encouragement for participation or interaction.  Function - Problem Solving Problem solving assist level: Solves basic 75 - 89% of the time/requires cueing 10 - 24% of the time  Function - Memory Memory assist level: Recognizes or recalls 50 - 74% of the time/requires cueing 25 - 49% of the time Patient normally able to recall (first 3 days only):  Current season, Location of own room, That he or she is in a hospital, Staff names and faces  Medical Problem List and Plan: 1.Altered mental status with lethargysecondary to right cerebellar parenchymal hemorrhage felt to be secondary to hypertensive crisis  PT, OT, SLP team conf in am 2. DVT Prophylaxis/Anticoagulation: Subcutaneous Lovenox 30 mg daily initiated 11/01/2017 3. Pain Management:Tylenol as needed 4. Mood:Provided pt positive  emotional support.  5. Neuropsych: This patientiscapable of making decisions on herown behalf. 6. Skin/Wound Care:Routine skin checks 7. 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 Not a candidate for CABG. Patient cleared to begin aspirin 81 mg daily 11/05/2017.Plan for PCI as an outpatient follow-up 9.Decompensated systolic congestive heart failure. Monitor for any signs of fluid overload. Lasix 40 mg daily  -need to record weights Filed Weights   11/04/17 1832 11/04/17 2201  Weight: 60.8 kg (134 lb 0.6 oz) 60.8 kg (134 lb 0.6 oz)    10.Hypertension. Coreg 12.5 mg twice daily, Altace 10 mg twice daily, Aldactone 25 mg daily, Nitro-Dur patch 0.2 mg daily Vitals:   11/08/17 2043 11/09/17 0458  BP: (!) 161/80 (!) 165/85  Pulse: 71 67  Resp: 18 18  Temp: 97.7 F (36.5 C) (!) 97.4 F (36.3 C)  SpO2: 67% 89%  systolic elevation 3/81-[id number] 11.Diabetes mellitus with peripheral neuropathy. Levemir 6 units nightly. Checking blood sugars before meals and at bedtime CBG (last 3)  Recent Labs    11/08/17 1659 11/08/17 2142 11/09/17 0652  GLUCAP 190* 176* 139*  adequate control--5/21 12.History of breast, colon, endometrial and cervical cancer. Continue hydroxyurea  LOS (Days) 5 A FACE TO FACE EVALUATION WAS PERFORMED  Charlett Blake 11/09/2017, 8:28 AM

## 2017-11-09 NOTE — Progress Notes (Signed)
Physical Therapy Session Note  Patient Details  Name: [name] MRN: [id number] Date of Birth: Feb 21, 1932  Today's Date: 11/09/2017 PT Individual Time: 1055-1110 PT Individual Time Calculation (min): 15 min   Short Term Goals: Week 1:  PT Short Term Goal 1 (Week 1): Pt will be able to perform functional transfers consistently with min assist PT Short Term Goal 2 (Week 1): Pt will be able to gait with LRAD x 75' with min assist PT Short Term Goal 3 (Week 1): Pt will be able to perform 4 steps with bilateral rails with min assist for strengthening and increased muscular endurance  Skilled Therapeutic Interventions/Progress Updates:  Pt received sitting in recliner & agreeable to tx. No c/o pain reported. Pt on 2L/min supplemental oxygen at rest and reduced to room air (cleared to wean oxygen per PA, Dan) SpO2 = 90-93% and HR = 64 bpm. Pt willing to try to participate in therapy and required cuing to scoot out to edge of seat then push up to standing. Prior to transferring to standing pt began stating "I don't feel good" but unable to elaborate and then asked for her mother. Pt able to state current location and that her mother is deceased but reported asking for her mother for help. Pt returned to long sitting in recliner and SpO2 = 93% on room air. Pt left on room air in recliner with chair alarm donned & call bell in lap. RN notified of pt's status.  Therapy Documentation Precautions:  Precautions Precautions: Fall Precaution Comments: 2L O2 via [place]; h/o CA and several falls Restrictions Weight Bearing Restrictions: No  General: PT Amount of Missed Time (min): 60 Minutes PT Missed Treatment Reason: Patient fatigue    See Function Navigator for Current Functional Status.   Therapy/Group: Individual Therapy  [name] 11/09/2017, 11:22 AM

## 2017-11-09 NOTE — Progress Notes (Signed)
Back to bed with assistance.  Pt states she still do not feel any better

## 2017-11-09 NOTE — Progress Notes (Signed)
PT complaining of not feeling good. Pt noted with some confusion. Vital signs taken and recorded.  Call the PA and made him aware that pt is noted to have some confusion.  Blood work ordered and ua to be sent to the lab.  Pt still sitting in the chair.  Refused to get in the bed at this time.

## 2017-11-09 NOTE — Progress Notes (Signed)
Speech Language Pathology Daily Session Note  Patient Details  Name: [name] MRN: [id number] Date of Birth: 1931/10/02  Today's Date: 11/09/2017 SLP Individual Time: 0900-1000 SLP Individual Time Calculation (min): 60 min  Short Term Goals: Week 1: SLP Short Term Goal 1 (Week 1): Pt will consume trials of regular solids with supervision cues for use of swallowing precautions and no observable signs of fatigue or changes in respiratory status.   SLP Short Term Goal 2 (Week 1): Pt will overarticulate and increase vocal intensity to achieve intelligibility at the sentence level with min cues for use of swallowing precautions.    Skilled Therapeutic Interventions:Skilled ST services focused on swallow and cognitive skills. Pt was waiting for breakfast upon entering room, NT when to kitchen to retreive it. SLP provided skilled observation of PO consumption of thin liquid and whole medication with no noted impairments. Pt received tray and consumed dys 1 textured food (oatmeal) with no impairment, but limited appetite declining regular trials.Marland Kitchen SLP facilitated further investigation of cognitive linguistic impairments with administration of CLQT, however unable to complete due to pt requesting to use restroom urgently. Pt demonstrated impairments in immediate recall, delayed recall, problem solving, and sustained attention. SLP will continue investigation of cognitive linguistic imapirments, suggesting more simplistic assessment. Pt was handed off to NT while using the restroom. Pt was left in room with call bell within reach. Reccomend to continue skilled ST services.      Function:  Eating Eating   Modified Consistency Diet: Yes Eating Assist Level: Supervision or verbal cues;Set up assist for   Eating Set Up Assist For: Opening containers       Cognition Comprehension Comprehension assist level: Follows basic conversation/direction with no assist  Expression   Expression assist level:  Expresses basic 50 - 74% of the time/requires cueing 25 - 49% of the time. Needs to repeat parts of sentences.;Expresses basic 75 - 89% of the time/requires cueing 10 - 24% of the time. Needs helper to occlude trach/needs to repeat words.  Social Interaction Social Interaction assist level: Interacts appropriately 90% of the time - Needs monitoring or encouragement for participation or interaction.  Problem Solving Problem solving assist level: Solves basic 50 - 74% of the time/requires cueing 25 - 49% of the time;Solves basic 25 - 49% of the time - needs direction more than half the time to initiate, plan or complete simple activities  Memory Memory assist level: Recognizes or recalls 50 - 74% of the time/requires cueing 25 - 49% of the time;Recognizes or recalls 25 - 49% of the time/requires cueing 50 - 75% of the time    Pain Pain Assessment Pain Scale: 0-10 Pain Score: 0-No pain Faces Pain Scale: No hurt  Therapy/Group: Individual Therapy  Steffen Hase  [facility] 11/09/2017, 3:57 PM

## 2017-11-09 NOTE — Progress Notes (Signed)
Occupational Therapy Session Note  Patient Details  Name: [name] MRN: [id number] Date of Birth: 02/27/1932  Today's Date: 11/09/2017 OT Individual Time: 1300-1400 OT Individual Time Calculation (min): 60 min    Short Term Goals: Week 1:  OT Short Term Goal 1 (Week 1): Pt will sit to stand with min A to advance pants past hips wiht LRAD OT Short Term Goal 2 (Week 1): Pt will don socks with AE PRN and supervision OT Short Term Goal 3 (Week 1): Pt will groom at sink in standing for 2 items to demo improved standing endurance OT Short Term Goal 4 (Week 1): Pt will complete toilet transfer with MIN A and LRAD OT Short Term Goal 5 (Week 1): Pt will don shirt with supervision  Skilled Therapeutic Interventions/Progress Updates:    Pt seen for OT session focusing on functional ambulation, transfers,ADL re-training and functional activity tolerance.  Pt sitting upin recliner upon arrival with NTentering to assist therapist to bathroom, hand off to OT. Pt ambulated short distance to w/c with min A,able to stand from recliner with guarding assist. Completed stand pivot transfer to toilet with use of grab bars. With steadying assist, pt ableto complete clothing management in prep for toileting task. Total A provided for hygiene following BM and pt able to pull pants back up with steadying assist. She completed hand hygiene from seated position and following rest break stood at sink to complete oral care in order to address functional standing balance/endurance, though leaned heavily into sink ledge for support. Pt tolerated ~2-3 minutes in standing before requiring seated rest break. In therapy day room, pt stood at high/low table to fold towels, incorporating B UEs into functional task. Pt able to stand at table without leaning into it for support with CGA, tolerated ~3 minutes standing to complete task. With encouragement, she propelled w/c ~62ft using B UEs for UE strengthening and activity  tolerance. Returned to room and request to supine. Upon laying down pt with complaints of nausea and desiring HOB be elevated, while HOB elevated, pt with emesis episode. Following clean up, able to transition to recliner in order to bed linen and room to be cleaned. Pt left seated in recliner at end of session, all needs in reach. NT made aware of pt's position and RN aware.  Throughout session and with activity, pt's O2 remained 94%-96% on RA. She required encouragement for participation throughout session, stating task wasn't possible before attempt. Education provided regarding IPR, POC, and need to work hard in order to return to PLOF.  Therapy Documentation Precautions:  Precautions Precautions: Fall Precaution Comments: 2L O2 via [place]; h/o CA and several falls Restrictions Weight Bearing Restrictions: No Pain:   No/denies pain  See Function Navigator for Current Functional Status.   Therapy/Group: Individual Therapy  Shanai Lartigue L 11/09/2017, 7:08 AM

## 2017-11-10 ENCOUNTER — Inpatient Hospital Stay (HOSPITAL_COMMUNITY): Payer: Medicare Other | Admitting: Occupational Therapy

## 2017-11-10 ENCOUNTER — Inpatient Hospital Stay (HOSPITAL_COMMUNITY): Payer: Medicare Other | Admitting: Physical Therapy

## 2017-11-10 ENCOUNTER — Inpatient Hospital Stay (HOSPITAL_COMMUNITY): Payer: Medicare Other

## 2017-11-10 LAB — GLUCOSE, CAPILLARY
Glucose-Capillary: 113 mg/dL — ABNORMAL HIGH (ref 65–99)
Glucose-Capillary: 139 mg/dL — ABNORMAL HIGH (ref 65–99)
Glucose-Capillary: 163 mg/dL — ABNORMAL HIGH (ref 65–99)
Glucose-Capillary: 171 mg/dL — ABNORMAL HIGH (ref 65–99)

## 2017-11-10 MED ORDER — METHYLPHENIDATE HCL 5 MG PO TABS
5.0000 mg | ORAL_TABLET | Freq: Two times a day (BID) | ORAL | Status: DC
  Administered 2017-11-10 – 2017-11-17 (×15): 5 mg via ORAL
  Filled 2017-11-10 (×15): qty 1

## 2017-11-10 MED ORDER — POTASSIUM CHLORIDE CRYS ER 20 MEQ PO TBCR
30.0000 meq | EXTENDED_RELEASE_TABLET | Freq: Every day | ORAL | Status: DC
  Administered 2017-11-11 – 2017-11-13 (×3): 30 meq via ORAL
  Filled 2017-11-10 (×3): qty 1

## 2017-11-10 NOTE — Patient Care Conference (Signed)
Inpatient RehabilitationTeam Conference and Plan of Care Update Date: 11/10/2017   Time: 10:45 AM    Patient Name: [name]      Medical Record Number: [id number]  Date of Birth: 02-Oct-1931 Sex: Female         Room/Bed: 4W21C/4W21C-01 Payor Info: Payor: [employer] MEDICARE / Plan: [employer] MEDICARE / Product Type: *No Product type* /    Admitting Diagnosis: Rt CVA  Admit Date/Time:  11/04/2017  3:36 PM Admission Comments: No comment available   Primary Diagnosis:  <principal problem not specified> Principal Problem: <principal problem not specified>  Patient Active Problem List   Diagnosis Date Noted  . H/O cerebral parenchymal hemorrhage 11/04/2017  . Essential hypertension 11/04/2017  . Hyperlipidemia 11/04/2017  . Diabetes ([place]) 11/04/2017  . CAD (coronary artery disease) 11/04/2017  . 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 Hypokalemia 11/04/2017  . 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 History of cervical cancer   . History of TIA (transient ischemic attack)   . Acute systolic congestive heart failure ([place])   . Reactive hypertension   . Hypernatremia   . Leukocytosis   . Acute blood loss anemia   . Elevated serum creatinine   . Acute respiratory failure with hypoxia ([place])   . IVH (intraventricular hemorrhage) ([place]) 10/29/2017  . Hypoxia 10/28/2017  . Pancreatic lesion 05/24/2017  . 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 Pulmonary nodule, left 06/04/2015  . 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 Cerebral thrombosis with cerebral infarction 04/03/2015  . Cancer of right colon ([place]) 03/28/2015  . CVA (cerebral infarction) 12/21/2014  . Polycythemia vera ([place]) 06/22/2006    Expected Discharge Date: Expected Discharge Date: 11/17/17  Team Members Present: Physician leading conference: Dr. Alysia Penna Social Worker Present: Ovidio Kin, LCSW Nurse Present: Junius Creamer, RN PT Present:  Lavone Nian, PT OT Present: Napoleon Form, OT SLP Present: Charolett Bumpers, SLP PPS Coordinator present : Daiva Nakayama, RN, CRRN     Current Status/Progress Goal Weekly Team Focus  Medical   Poor score on MO CA, fatigues easily, right hemiataxia  Improve endurance to optimize functional status  Adjust therapy schedule   Bowel/Bladder   continent of b/b , LBM 5/21, up to BR with RW 1 assist  maintain b/b status with Mod I A  monitor b/b q shift and prn   Swallow/Nutrition/ Hydration   Supervision A, dys 3 and thin   Mod I  carryover strategies    ADL's   Min A UB bathing/dressing, max A LB dressing, max A toileting, min A functional transfers and ambulation  Supervision overall  ADL re-training, functional activity tolerance, functional standing balance/endurance   Mobility   min<>mod assist bed mobility, min<>mod assist transfers, min<>mod gait 5-10 ft with RW, pt limited by extreme fatigue and very poor activity tolerance  mod I bed mobility & sitting balance, supervision transfers & gait with LRAD  activity tolerance, transfers, pt education, strengthening, gait   Communication   Max-Mod A phrase, Mod-Min A word level  Supervision A  speech intelligibility strategies   Safety/Cognition/ Behavioral Observations  Max-Mod A  Min A  sustained attention, problem solving, recall and emergent awareness   Pain   pt does not c/o pain  pain <2  monitor pain q shift and prn   Skin   skin CDI, scattered bruises, R groin   maintain skin with Mod I Assist  monitor skin q shift and prn      *  See Care Plan and progress notes for long and short-term goals.     Barriers to Discharge  Current Status/Progress Possible Resolutions Date Resolved   Physician    Medical stability;Other (comments)  Endurance, attention  Slow progress  Continue rehab, methylphenidate trial      Nursing                  PT  Decreased caregiver support;Home environment access/layout;Lack of/limited family support;New  oxygen  pt will need 24 hr supervision but does not currently have anyone (family does not live in town), pt with very poor activity tolerance and at this point is unable to negotiate steps with 1 rail to enter her home              OT                  SLP                SW                Discharge Planning/Teaching Needs:  Home with hired assist, niece working on this, but also awaiting goals which has been told pt will need supervision level at home      Team Discussion:  Goals set for supervision level needs for safety. [place] 11/30. Fatigue is a major factor and limits pt in therapies. Has weaned off O2. Diet Dys 3 thin liquids. Needs encouragement to participate. Made 15/7 now. Poor activity tolerance. Need to come up with a safe plan for discharge.  Revisions to Treatment Plan:  DC 5/29 15/7 in therapies    Continued Need for Acute Rehabilitation Level of Care: The patient requires daily medical management by a physician with specialized training in physical medicine and rehabilitation for the following conditions: Daily direction of a multidisciplinary physical rehabilitation program to ensure safe treatment while eliciting the highest outcome that is of practical value to the patient.: Yes Daily medical management of patient stability for increased activity during participation in an intensive rehabilitation regime.: Yes Daily analysis of laboratory values and/or radiology reports with any subsequent need for medication adjustment of medical intervention for : Neurological problems;Mood/behavior problems  [name], [name] 11/10/2017, 12:49 PM

## 2017-11-10 NOTE — Progress Notes (Signed)
Physical Therapy Session Note  Patient Details  Name: [name] MRN: [id number] Date of Birth: 05-20-1932  Today's Date: 11/10/2017 PT Individual Time: 0935-1030 PT Individual Time Calculation (min): 55 min   Short Term Goals: Week 1:  PT Short Term Goal 1 (Week 1): Pt will be able to perform functional transfers consistently with min assist PT Short Term Goal 2 (Week 1): Pt will be able to gait with LRAD x 75' with min assist PT Short Term Goal 3 (Week 1): Pt will be able to perform 4 steps with bilateral rails with min assist for strengthening and increased muscular endurance  Skilled Therapeutic Interventions/Progress Updates:  Pt received in bed & agreeable to tx. No c/o pain but pt reports dizziness after transferring to sit EOB, no nystagmus noted. Pt transferred to EOB with supervision and hospital bed features with extra time. Pt transfers bed>w/c via stand pivot with min assist and min cuing for hand placement and technique. Transported pt to gym & BP assessed, see below. Pt requires cuing for hand placement for sit<>stand transfers but continues to demonstrate decreased awareness and carryover of information. Pt ambulates 45 ft with RW & min assist and afterwards SpO2 = 86% on room air but increased to 88-89% with cuing to inhale through nose, then increasing to 94%. Pt then ambulates ~33 ft with RW & min assist with decreased gait speed and increased reliance on UE on RW. Pt with poor eccentric control with stand>sit and SpO2 = 90% after task. Pt engaged in threading beads on string while standing with supervision for balance and without UE support with task focusing on standing tolerance; pt able to stand 3 minutes 15 second before requiring seated rest break 2/2 pt reported fatigue. Pt propelled w/c x 30 ft with min assist with cuing for technique for strengthening & endurance training. At end of session pt assisted back to bed and left with alarm set & all needs within reach. Pt on  room air, SpO2 = 90% at end of session.   Therapy Documentation Precautions:  Precautions Precautions: Fall Precaution Comments: 2L O2 via [place]; h/o CA and several falls Restrictions Weight Bearing Restrictions: No  Vital Signs: Therapy Vitals Pulse Rate: 62 BP: 137/63 Patient Position (if appropriate): Sitting  Other Treatments:     See Function Navigator for Current Functional Status.   Therapy/Group: Individual Therapy  [name] 11/10/2017, 10:36 AM

## 2017-11-10 NOTE — Progress Notes (Signed)
Social Work Patient ID: [name], female   DOB: [birth year], 84 y.o.   MRN: [id number]  Clinical update faxed to [facility] will await response.

## 2017-11-10 NOTE — Progress Notes (Signed)
Speech Language Pathology Daily Session Note  Patient Details  Name: [name] MRN: [id number] Date of Birth: Sep 18, 1931  Today's Date: 11/10/2017 SLP Individual Time: 0800-0900 SLP Individual Time Calculation (min): 60 min  Short Term Goals: Week 1: SLP Short Term Goal 1 (Week 1): Pt will consume trials of regular solids with supervision cues for use of swallowing precautions and no observable signs of fatigue or changes in respiratory status.   SLP Short Term Goal 2 (Week 1): Pt will overarticulate and increase vocal intensity to achieve intelligibility at the sentence level with min cues for use of swallowing precautions.   SLP Short Term Goal 3 (Week 1): Pt will demonstrate sustained attention during functional task for 15 minutes with Min A verbal cues. SLP Short Term Goal 4 (Week 1): Pt will recall basic,daily information with Mod A verbal cues for use of external aid.  SLP Short Term Goal 5 (Week 1): Pt will demonstrate funtional problem solving for basic and familar (mildly complex) tasks with Mod A verbal cues. SLP Short Term Goal 6 (Week 1): Pt will self-monitor functional errors with Min A verbal cues.   Skilled Therapeutic Interventions:Skilled ST services focused on cognitive skills. SLP facilitated further investigation of cognitive linguistic impairments with administration of MOCA version 7.1 scoring 11 out 30 (n=>26) indicating severe cognitive impairment primarily due to reduced sustained attention, and recall impacting functional to mildly complex problem solving and emergent awareness which is overall influenced by pt's fatigue. SLP added STG and LTG to address cognitive deficits and adjusted LTG speech goals due to lack of carryover of strategies with continued fatigue. Pt required max A verbal cues at phrase level and Mod A word level for speech intelligibility. Pt was left in room with call bell within reach. Reccomend to continue skilled ST services.       Function:  Eating Eating                 Cognition Comprehension Comprehension assist level: Follows basic conversation/direction with extra time/assistive device  Expression   Expression assist level: Expresses basic 50 - 74% of the time/requires cueing 25 - 49% of the time. Needs to repeat parts of sentences.;Expresses basic 25 - 49% of the time/requires cueing 50 - 75% of the time. Uses single words/gestures.  Social Interaction Social Interaction assist level: Interacts appropriately 90% of the time - Needs monitoring or encouragement for participation or interaction.  Problem Solving Problem solving assist level: Solves basic 50 - 74% of the time/requires cueing 25 - 49% of the time;Solves basic 25 - 49% of the time - needs direction more than half the time to initiate, plan or complete simple activities  Memory Memory assist level: Recognizes or recalls 50 - 74% of the time/requires cueing 25 - 49% of the time;Recognizes or recalls 25 - 49% of the time/requires cueing 50 - 75% of the time    Pain Pain Assessment Pain Score: 0-No pain  Therapy/Group: Individual Therapy  Ivon Oelkers  [facility] 11/10/2017, 11:31 AM

## 2017-11-10 NOTE — Progress Notes (Signed)
Social Work Patient ID: [name], female   DOB: 12/08/1931, 85 y.o.   MRN: [id number]  Met with pt and left message for niece-Sue Ellen to discuss team conference goals supervision level and target discharge date 5/29. Pt realizes she will need someone with her but unsure what they will do. Will discuss with niece and offer options hired assist versus ALF. Will come up with safe plan for pt.  

## 2017-11-10 NOTE — Progress Notes (Signed)
Physical Therapy Session Note  Patient Details  Name: [name] MRN: [id number] Date of Birth: 1932/01/21  Today's Date: 11/10/2017 PT Individual Time: 1120-1200(Make up time) PT Individual Time Calculation (min): 40 min   Short Term Goals: Week 1:  PT Short Term Goal 1 (Week 1): Pt will be able to perform functional transfers consistently with min assist PT Short Term Goal 2 (Week 1): Pt will be able to gait with LRAD x 75' with min assist PT Short Term Goal 3 (Week 1): Pt will be able to perform 4 steps with bilateral rails with min assist for strengthening and increased muscular endurance   Skilled Therapeutic Interventions/Progress Updates:   Pt received supine in bed and agreeable to PT. Supine>sit transfer with min assist and min cues. Sit<>stand from EOB to prepared for gait with min assist from PT.   Gait to bathroom for urination with min assist. Toilet transfers with min assist from PT and cues for proper use of BUE. [place] with supervision assist from PT. Pt ambulated to sink with min assist and RW; Hand hygiene standing at sink with supervision assist from PT.   Pt transported to hospital gift shop. Gait training with RW x 71f with min-supervision assist from PT. PT provided min cues for improved pursed lip breathing to maintain O2 sats. As well as min cues for AD management to avoid obstacles on the R.   [facility] and all needs met.          Therapy Documentation Precautions:  Precautions Precautions: Fall Precaution Comments: 2L O2 via [place]; h/o CA and several falls Restrictions Weight Bearing Restrictions: No    Vital Signs: Therapy Vitals Pulse Rate: 62 BP: 137/63 Patient Position (if appropriate): Sitting Pain: Pain Assessment Pain Score: 0-No pain   See Function Navigator for Current Functional Status.   Therapy/Group: Individual Therapy  ALorie Phenix5/22/2019, 12:56 PM

## 2017-11-10 NOTE — Progress Notes (Signed)
Subjective/Complaints:  No issues overnite, vomited chocolate pudding yesterday , no abd pain today, no issues with breakfast  ROS: Patient denies N/V/D, SOB, CP jt pain   Objective: Vital Signs: Blood pressure 134/63, pulse 66, temperature 97.6 F (36.4 C), temperature source Oral, resp. rate 18, height 5' 1"  (1.549 m), weight 60.8 kg (134 lb 0.6 oz), SpO2 98 %. No results found. Results for orders placed or performed during the hospital encounter of 11/04/17 (from the past 72 hour(s))  Glucose, capillary     Status: Abnormal   Collection Time: 11/07/17 11:57 AM  Result Value Ref Range   Glucose-Capillary 191 (H) 65 - 99 mg/dL  Glucose, capillary     Status: Abnormal   Collection Time: 11/07/17  5:09 PM  Result Value Ref Range   Glucose-Capillary 185 (H) 65 - 99 mg/dL  Glucose, capillary     Status: Abnormal   Collection Time: 11/07/17  9:28 PM  Result Value Ref Range   Glucose-Capillary 190 (H) 65 - 99 mg/dL  Glucose, capillary     Status: Abnormal   Collection Time: 11/08/17  6:50 AM  Result Value Ref Range   Glucose-Capillary 158 (H) 65 - 99 mg/dL  Glucose, capillary     Status: Abnormal   Collection Time: 11/08/17 11:55 AM  Result Value Ref Range   Glucose-Capillary 156 (H) 65 - 99 mg/dL  Glucose, capillary     Status: Abnormal   Collection Time: 11/08/17  4:59 PM  Result Value Ref Range   Glucose-Capillary 190 (H) 65 - 99 mg/dL  Glucose, capillary     Status: Abnormal   Collection Time: 11/08/17  9:42 PM  Result Value Ref Range   Glucose-Capillary 176 (H) 65 - 99 mg/dL  Glucose, capillary     Status: Abnormal   Collection Time: 11/09/17  6:52 AM  Result Value Ref Range   Glucose-Capillary 139 (H) 65 - 99 mg/dL  Glucose, capillary     Status: Abnormal   Collection Time: 11/09/17 11:53 AM  Result Value Ref Range   Glucose-Capillary 160 (H) 65 - 99 mg/dL  CBC     Status: Abnormal   Collection Time: 11/09/17 12:11 PM  Result Value Ref Range   WBC 16.0 (H) 4.0 -  10.5 K/uL   RBC 4.57 3.87 - 5.11 MIL/uL   Hemoglobin 12.2 12.0 - 15.0 g/dL   HCT 40.0 36.0 - 46.0 %   MCV 87.5 78.0 - 100.0 fL   MCH 26.7 26.0 - 34.0 pg   MCHC 30.5 30.0 - 36.0 g/dL   RDW 18.0 (H) 11.5 - 15.5 %   Platelets 589 (H) 150 - 400 K/uL    Comment: Performed at [facility], [address], [place], [place] [address]  Basic metabolic panel     Status: Abnormal   Collection Time: 11/09/17 12:11 PM  Result Value Ref Range   Sodium 140 135 - 145 mmol/L   Potassium 3.3 (L) 3.5 - 5.1 mmol/L   Chloride 100 (L) 101 - 111 mmol/L   CO2 29 22 - 32 mmol/L   Glucose, Bld 189 (H) 65 - 99 mg/dL   BUN 11 6 - 20 mg/dL   Creatinine, Ser 1.07 (H) 0.44 - 1.00 mg/dL   Calcium 8.7 (L) 8.9 - 10.3 mg/dL   GFR calc non Af Amer 47 (L) >60 mL/min   GFR calc Af Amer 54 (L) >60 mL/min    Comment: (NOTE) The eGFR has been calculated using the CKD EPI  equation. This calculation has not been validated in all clinical situations. eGFR's persistently <60 mL/min signify possible Chronic Kidney Disease.    Anion gap 11 5 - 15    Comment: Performed at [facility] [address], [place], [place] [address]  Glucose, capillary     Status: Abnormal   Collection Time: 11/09/17  4:59 PM  Result Value Ref Range   Glucose-Capillary 154 (H) 65 - 99 mg/dL  Urinalysis, Routine w reflex microscopic     Status: Abnormal   Collection Time: 11/09/17  6:33 PM  Result Value Ref Range   Color, Urine STRAW (A) YELLOW   APPearance CLEAR CLEAR   Specific Gravity, Urine 1.003 (L) 1.005 - 1.030   pH 6.0 5.0 - 8.0   Glucose, UA NEGATIVE NEGATIVE mg/dL   Hgb urine dipstick NEGATIVE NEGATIVE   Bilirubin Urine NEGATIVE NEGATIVE   Ketones, ur NEGATIVE NEGATIVE mg/dL   Protein, ur NEGATIVE NEGATIVE mg/dL   Nitrite NEGATIVE NEGATIVE   Leukocytes, UA NEGATIVE NEGATIVE    Comment: Performed at [facility] [address], [place], [place] [address]  Glucose, capillary     Status: Abnormal   Collection  Time: 11/09/17  9:36 PM  Result Value Ref Range   Glucose-Capillary 160 (H) 65 - 99 mg/dL  Glucose, capillary     Status: Abnormal   Collection Time: 11/10/17  6:44 AM  Result Value Ref Range   Glucose-Capillary 113 (H) 65 - 99 mg/dL     Constitutional: No distress . Vital signs reviewed. HEENT: EOMI, oral membranes moist Neck: supple Cardiovascular: RRR without murmur. No JVD    Respiratory: CTA Bilaterally without wheezes or rales. Normal effort    GI: BS +, non-tender, non-distended  Skin:   Other PICC site is clean, no erythema Neuro: Alert/Oriented, Abnormal Motor 4/5 in RUE and RLE , 5/5 on Left side and Abnormal FMC Ataxic/ dec FMC. Speech dysarthric Musc/Skel:  Other No pain with UE or LE ROM Gen NAD   Assessment/Plan: 1. Functional deficits secondary to RIght hemiataxia due Right cerebellar ICH which require 3+ hours per day of interdisciplinary therapy in a comprehensive inpatient rehab setting. Physiatrist is providing close team supervision and 24 hour management of active medical problems listed below. Physiatrist and rehab team continue to assess barriers to discharge/monitor patient progress toward functional and medical goals. FIM: Function - Bathing Position: Wheelchair/chair at sink Body parts bathed by patient: Right arm, Left arm, Chest, Abdomen, Front perineal area, Right upper leg, Left upper leg, Right lower leg, Left lower leg Body parts bathed by helper: Buttocks, Back Assist Level: Touching or steadying assistance(Pt > 75%)  Function- Upper Body Dressing/Undressing What is the patient wearing?: Pull over shirt/dress Bra - Perfomed by patient: Thread/unthread right bra strap, Thread/unthread left bra strap Bra - Perfomed by helper: Hook/unhook bra (pull down sports bra) Pull over shirt/dress - Perfomed by patient: Thread/unthread right sleeve, Thread/unthread left sleeve, Put head through opening Pull over shirt/dress - Perfomed by helper: Pull shirt over  trunk Assist Level: Touching or steadying assistance(Pt > 75%) Function - Lower Body Dressing/Undressing What is the patient wearing?: Pants, Underwear Position: Wheelchair/chair at sink Underwear - Performed by patient: Thread/unthread right underwear leg Underwear - Performed by helper: Thread/unthread left underwear leg, Pull underwear up/down Pants- Performed by patient: Thread/unthread right pants leg, Thread/unthread left pants leg, Pull pants up/down Pants- Performed by helper: Pull pants up/down Non-skid slipper socks- Performed by helper: Don/doff right sock, Don/doff left  sock Assist for footwear: Dependant Assist for lower body dressing: Touching or steadying assistance (Pt > 75%)  Function - Toileting Toileting activity did not occur: No continent bowel/bladder event Toileting steps completed by patient: Adjust clothing prior to toileting, Adjust clothing after toileting, Performs perineal hygiene Toileting steps completed by helper: Adjust clothing prior to toileting, Performs perineal hygiene, Adjust clothing after toileting Toileting Assistive Devices: Grab bar or rail Assist level: Touching or steadying assistance (Pt.75%), No help/no cues  Function - Air cabin crew transfer assistive device: Grab bar, Walker Assist level to toilet: Touching or steadying assistance (Pt > 75%) Assist level from toilet: Touching or steadying assistance (Pt > 75%)  Function - Chair/bed transfer Chair/bed transfer method: Ambulatory Chair/bed transfer assist level: Touching or steadying assistance (Pt > 75%) Chair/bed transfer assistive device: Walker  Function - Locomotion: Wheelchair Type: Manual Assist Level: Dependent (Pt equals 0%) Assist Level: Dependent (Pt equals 0%) Assist Level: Dependent (Pt equals 0%) Turns around,maneuvers to table,bed, and toilet,negotiates 3% grade,maneuvers on rugs and over doorsills: No Function - Locomotion: Ambulation Assistive device:  Walker-rolling Max distance: 10 ft Assist level: Touching or steadying assistance (Pt > 75%) Walk 10 feet activity did not occur: Safety/medical concerns Assist level: Touching or steadying assistance (Pt > 75%) Walk 50 feet with 2 turns activity did not occur: Safety/medical concerns Walk 150 feet activity did not occur: Safety/medical concerns Walk 10 feet on uneven surfaces activity did not occur: Safety/medical concerns  Function - Comprehension Comprehension: Auditory Comprehension assist level: Follows basic conversation/direction with no assist  Function - Expression Expression: Verbal Expression assist level: Expresses basic 50 - 74% of the time/requires cueing 25 - 49% of the time. Needs to repeat parts of sentences., Expresses basic 75 - 89% of the time/requires cueing 10 - 24% of the time. Needs helper to occlude trach/needs to repeat words.  Function - Social Interaction Social Interaction assist level: Interacts appropriately 90% of the time - Needs monitoring or encouragement for participation or interaction.  Function - Problem Solving Problem solving assist level: Solves basic 50 - 74% of the time/requires cueing 25 - 49% of the time, Solves basic 25 - 49% of the time - needs direction more than half the time to initiate, plan or complete simple activities  Function - Memory Memory assist level: Recognizes or recalls 50 - 74% of the time/requires cueing 25 - 49% of the time, Recognizes or recalls 25 - 49% of the time/requires cueing 50 - 75% of the time Patient normally able to recall (first 3 days only): Current season, Location of own room, That he or she is in a hospital, Staff names and faces  Medical Problem List and Plan: 1.Altered mental status with lethargysecondary to right cerebellar parenchymal hemorrhage felt to be secondary to hypertensive crisis  PT, OT, SLP Team conference today please see physician documentation under team conference tab, met with  team face-to-face to discuss problems,progress, and goals. Formulized individual treatment plan based on medical history, underlying problem and comorbidities. 2. DVT Prophylaxis/Anticoagulation: Subcutaneous Lovenox 30 mg daily initiated 11/01/2017 3. Pain Management:Tylenol as needed 4. Mood:Provided pt positive emotional support.  5. Neuropsych: This patientiscapable of making decisions on herown behalf. 6. Skin/Wound Care:Routine skin checks 7. 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 Not a candidate for CABG. Patient cleared to begin aspirin 81 mg daily 11/05/2017.Plan for PCI as an outpatient follow-up 9.Decompensated systolic congestive heart failure. Monitor for any signs of fluid overload. Lasix 40 mg  daily  -need to record weights Filed Weights   11/04/17 1832 11/04/17 2201  Weight: 60.8 kg (134 lb 0.6 oz) 60.8 kg (134 lb 0.6 oz)    10.Hypertension. Coreg 12.5 mg twice daily, Altace 10 mg twice daily, Aldactone 25 mg daily, Nitro-Dur patch 0.2 mg daily Vitals:   11/09/17 2033 11/10/17 0428  BP: (!) 147/72 134/63  Pulse: 60 66  Resp: 18 18  Temp: 97.9 F (36.6 C) 97.6 F (36.4 C)  SpO2: (!) 89% 98%  controlled 5/22 11.Diabetes mellitus with peripheral neuropathy. Levemir 6 units nightly. Checking blood sugars before meals and at bedtime CBG (last 3)  Recent Labs    11/09/17 1659 11/09/17 2136 11/10/17 0644  GLUCAP 154* 160* 113*  good inhospital control--5/22 12.History of breast, colon, endometrial and cervical cancer. Continue hydroxyurea 13 Mild HypoK due to lasix will increase KCL  Supplement to 6mq LOS (Days) 6 A FACE TO FACE EVALUATION WAS PERFORMED  ACharlett Blake5/22/2019, 9:20 AM

## 2017-11-10 NOTE — Progress Notes (Signed)
Occupational Therapy Session Note  Patient Details  Name: [name] MRN: [id number] Date of Birth: 03-28-1932  Today's Date: 11/10/2017 OT Individual Time: 1300-1345 OT Individual Time Calculation (min): 45 min  OT Missed Time: 30 min (Fatigue)   Short Term Goals: Week 1:  OT Short Term Goal 1 (Week 1): Pt will sit to stand with min A to advance pants past hips wiht LRAD OT Short Term Goal 2 (Week 1): Pt will don socks with AE PRN and supervision OT Short Term Goal 3 (Week 1): Pt will groom at sink in standing for 2 items to demo improved standing endurance OT Short Term Goal 4 (Week 1): Pt will complete toilet transfer with MIN A and LRAD OT Short Term Goal 5 (Week 1): Pt will don shirt with supervision  Skilled Therapeutic Interventions/Progress Updates:    Pt seen for OT ADL bathing/dressing session. Pt sitting up in w/c upon arrival, complaints of fatigue from previous tx sessions but willing to participate as able. Gathered clothing items from w/c level for energy conservation. Ambulated short distance into bathroom with min A, VCs for RW management in functional context. She completed toileting task with steadying assist before transitioning into shower. She bathed seated on tub transfer bench, max cuing for sequencing of task. Pt with eyes remaining closed through majority of bathing/dressing tasks and requiring VCs for alertness.  Completed stand pivot back to w/c following shower 2/2 fatigue. She dressed seated in w/c, required VCs following each step of dressing task. Total A to don socks as too fatigued following self dressing other parts. She required mod A to stand and transition back to bed at end of session, left laying in supine with all needs in reach and bed alarm on.  Pt missed 30 minutes of therapy session due to fatigue and unable to maintain alertness. RN made aware.   Therapy Documentation Precautions:  Precautions Precautions: Fall Precaution Comments: 2L O2 via  [place]; h/o CA and several falls Restrictions Weight Bearing Restrictions: No Pain:   No/denies pain  See Function Navigator for Current Functional Status.   Therapy/Group: Individual Therapy  Cassandr Cederberg L 11/10/2017, 7:21 AM

## 2017-11-10 NOTE — Plan of Care (Signed)
Pt's plan of care adjusted to 15/7 after speaking with care team and discussed with MD in team conference as pt currently unable to tolerate current therapy schedule with OT, PT, and SLP.   

## 2017-11-11 ENCOUNTER — Inpatient Hospital Stay (HOSPITAL_COMMUNITY): Payer: Medicare Other | Admitting: Speech Pathology

## 2017-11-11 ENCOUNTER — Inpatient Hospital Stay (HOSPITAL_COMMUNITY): Payer: Medicare Other | Admitting: Occupational Therapy

## 2017-11-11 ENCOUNTER — Inpatient Hospital Stay (HOSPITAL_COMMUNITY): Payer: Medicare Other

## 2017-11-11 LAB — CREATININE, SERUM
Creatinine, Ser: 1.31 mg/dL — ABNORMAL HIGH (ref 0.44–1.00)
GFR calc Af Amer: 42 mL/min — ABNORMAL LOW (ref >60)
GFR calc non Af Amer: 37 mL/min — ABNORMAL LOW (ref >60)

## 2017-11-11 LAB — GLUCOSE, CAPILLARY
Glucose-Capillary: 118 mg/dL — ABNORMAL HIGH (ref 65–99)
Glucose-Capillary: 156 mg/dL — ABNORMAL HIGH (ref 65–99)
Glucose-Capillary: 160 mg/dL — ABNORMAL HIGH (ref 65–99)
Glucose-Capillary: 212 mg/dL — ABNORMAL HIGH (ref 65–99)

## 2017-11-11 NOTE — Progress Notes (Signed)
Occupational Therapy Session Note  Patient Details  Name: [name] MRN: [id number] Date of Birth: April 07, 1932  Today's Date: 11/11/2017 OT Individual Time: 1100-1155 OT Individual Time Calculation (min): 55 min    Short Term Goals: Week 1:  OT Short Term Goal 1 (Week 1): Pt will sit to stand with min A to advance pants past hips wiht LRAD OT Short Term Goal 2 (Week 1): Pt will don socks with AE PRN and supervision OT Short Term Goal 3 (Week 1): Pt will groom at sink in standing for 2 items to demo improved standing endurance OT Short Term Goal 4 (Week 1): Pt will complete toilet transfer with MIN A and LRAD OT Short Term Goal 5 (Week 1): Pt will don shirt with supervision  Skilled Therapeutic Interventions/Progress Updates:    Pt seen for OT session focusing on ADL re-training, activity participation and functional communication skills. Pt sitting up in w/c upon arrival, voicing increased fatigue but willing to attempt therapy. She declined bathing/dressing this morning, however, willing to change shirt once pointed out food stain on shirt. She doffed/donned UB clothing with set-up assist, requiring rest break following task.  She stood at sink to brush teeth, tolerated ~2 minutes in standing with min A to complete task. Discussed at length pt's PLOF. She refuses all tx ideas stating " I don't do that at home" when given ADL/IADL activity. Pt reports she watches TV, eats out meals, and has hired help for home chores. Pt stated she watches TV and that's what she would like to do in therapy. Pt educated regarding rehab process and importance of activity and participation.  Pt taken to ADL apartment, she ambulated short distance to couch and completed ambulation and transfer with guarding assist. She required VCs throughout session for proper hand placement during sit<> stands at RW.  Seated on couch, participated in word finding game, required to describe words on card to therapist to guess  and vice versa. Pt requiring increased time and demonstrates more dysarthria this session compared to previous. Pt returned to room at end of session, left sitting up in w/c for lunch, QRB and chair alarm on. All needs in reach.   Therapy Documentation Precautions:  Precautions Precautions: Fall Precaution Comments: Restrictions Weight Bearing Restrictions: No Pain:   No/denies pain  See Function Navigator for Current Functional Status.   Therapy/Group: Individual Therapy  Rhonna Holster L 11/11/2017, 6:54 AM

## 2017-11-11 NOTE — Progress Notes (Signed)
Speech Language Pathology Daily Session Note  Patient Details  Name: [name] MRN: [id number] Date of Birth: 06/11/1932  Today's Date: 11/11/2017 SLP Individual Time: 1000-1100 SLP Individual Time Calculation (min): 60 min  Short Term Goals: Week 1: SLP Short Term Goal 1 (Week 1): Pt will consume trials of regular solids with supervision cues for use of swallowing precautions and no observable signs of fatigue or changes in respiratory status.   SLP Short Term Goal 2 (Week 1): Pt will overarticulate and increase vocal intensity to achieve intelligibility at the sentence level with min cues for use of swallowing precautions.   SLP Short Term Goal 3 (Week 1): Pt will demonstrate sustained attention during functional task for 15 minutes with Min A verbal cues. SLP Short Term Goal 4 (Week 1): Pt will recall basic,daily information with Mod A verbal cues for use of external aid.  SLP Short Term Goal 5 (Week 1): Pt will demonstrate funtional problem solving for basic and familar (mildly complex) tasks with Mod A verbal cues. SLP Short Term Goal 6 (Week 1): Pt will self-monitor functional errors with Min A verbal cues.   Skilled Therapeutic Interventions: Skilled treatment session focused on cognition goals. SLP facilitated session by providing Max A verbal and visual cues to achieve sustained attention to basic task for ~ 15 minute intervals. Pt with no recall of information and required Hand over Hand initial support to sort cards and to initially learn novel basic card game. After several repetitions, pt able to implement with Mod A cues. Despite Total A multimodal cues, pt was not able to sequence 3 basic cards. With Total A cues, pt able to state general phrase about action in card. Pt's verbal output questionable for word finding difficulty and phonemic dysfluencies. Pt was returned to room, left upright in wheelchair with safety belt donned and all needs within reach. Continue per current plan  of care.      Function:    Cognition Comprehension Comprehension assist level: Understands basic 50 - 74% of the time/ requires cueing 25 - 49% of the time  Expression   Expression assist level: Expresses basic 50 - 74% of the time/requires cueing 25 - 49% of the time. Needs to repeat parts of sentences.  Social Interaction Social Interaction assist level: Interacts appropriately 50 - 74% of the time - May be physically or verbally inappropriate.  Problem Solving Problem solving assist level: Solves basic 25 - 49% of the time - needs direction more than half the time to initiate, plan or complete simple activities;Solves basic less than 25% of the time - needs direction nearly all the time or does not effectively solve problems and may need a restraint for safety  Memory Memory assist level: Recognizes or recalls 50 - 74% of the time/requires cueing 25 - 49% of the time    Pain    Therapy/Group: Individual Therapy  Solly Derasmo 11/11/2017, 4:26 PM

## 2017-11-11 NOTE — Progress Notes (Signed)
Physical Therapy Note  Patient Details  Name: LEENA TIEDE MRN: [id number] Date of Birth: [birth year] Today's Date: 11/11/2017  1350-1450 , 60 min individual tx Pain: none per pt  Pt asleep in bed, but easily awakened.  She stated that she was worn out from visitors, and unable to do any tx.  Pt agreed to take several sips of water, and then agreed to do bedside tx.  Supine with HOB raised, neuro re-ed via multimodal cues for bil bridging, R/L straight leg raises, bil lower trunk rotation, adductor squeeze during bil bridging, cervical flexion, R/L shoulder protraction; R/L side lying for L/R clam shells for hip abduction, L/R hip extension via isolated gluteal activation.; seated bil shoulder adduction.  Bed mobility with HOB raised and use of bed rails.  Pt sat EOB without dizziness x 15 minutes.  Reciprocal scooting forward/backward with use of UEs fading to 0 UEs with manual cues for activation of core muscles.  Pt intermittently stated "I know what you want me to do, but I just can't do it."  Scooting laterally to R in flat bed with use of UEs.  VCs for strategies for speech intelligibility throughout session.   Pt left resting in bed with alarm set and needs at hand.  See function navigator for current status.   Carmon Brigandi 11/11/2017, 1:01 PM

## 2017-11-11 NOTE — Plan of Care (Signed)
LBM 11/09/17

## 2017-11-11 NOTE — Progress Notes (Signed)
Subjective/Complaints:  Discussed elevated WBCs pt notes PV hx, no fevers  ROS: Patient denies N/V/D, SOB, CP jt pain   Objective: Vital Signs: Blood pressure (!) 167/70, pulse (!) 59, temperature (!) 97.5 F (36.4 C), temperature source Oral, resp. rate 17, height _0  (1.549 m), weight 60.4 kg (133 lb 2.5 oz), SpO2 92 %. No results found. Results for orders placed or performed during the hospital encounter of 11/04/17 (from the past 72 hour(s))  Glucose, capillary     Status: Abnormal   Collection Time: 11/08/17 11:55 AM  Result Value Ref Range   Glucose-Capillary 156 (H) 65 - 99 mg/dL  Glucose, capillary     Status: Abnormal   Collection Time: 11/08/17  4:59 PM  Result Value Ref Range   Glucose-Capillary 190 (H) 65 - 99 mg/dL  Glucose, capillary     Status: Abnormal   Collection Time: 11/08/17  9:42 PM  Result Value Ref Range   Glucose-Capillary 176 (H) 65 - 99 mg/dL  Glucose, capillary     Status: Abnormal   Collection Time: 11/09/17  6:52 AM  Result Value Ref Range   Glucose-Capillary 139 (H) 65 - 99 mg/dL  Glucose, capillary     Status: Abnormal   Collection Time: 11/09/17 11:53 AM  Result Value Ref Range   Glucose-Capillary 160 (H) 65 - 99 mg/dL  CBC     Status: Abnormal   Collection Time: 11/09/17 12:11 PM  Result Value Ref Range   WBC 16.0 (H) 4.0 - 10.5 K/uL   RBC 4.57 3.87 - 5.11 MIL/uL   Hemoglobin 12.2 12.0 - 15.0 g/dL   HCT 40.0 36.0 - 46.0 %   MCV 87.5 78.0 - 100.0 fL   MCH 26.7 26.0 - 34.0 pg   MCHC 30.5 30.0 - 36.0 g/dL   RDW 18.0 (H) 11.5 - 15.5 %   Platelets 589 (H) 150 - 400 K/uL    Comment: Performed at [facility], [place] [address] [place], [place] [address]  Basic metabolic panel     Status: Abnormal   Collection Time: 11/09/17 12:11 PM  Result Value Ref Range   Sodium 140 135 - 145 mmol/L   Potassium 3.3 (L) 3.5 - 5.1 mmol/L   Chloride 100 (L) 101 - 111 mmol/L   CO2 29 22 - 32 mmol/L   Glucose, Bld 189 (H) 65 - 99 mg/dL   BUN 11 6 -  20 mg/dL   Creatinine, Ser 1.07 (H) 0.44 - 1.00 mg/dL   Calcium 8.7 (L) 8.9 - 10.3 mg/dL   GFR calc non Af Amer 47 (L) >60 mL/min   GFR calc Af Amer 54 (L) >60 mL/min    Comment: (NOTE) The eGFR has been calculated using the CKD EPI equation. This calculation has not been validated in all clinical situations. eGFR's persistently <60 mL/min signify possible Chronic Kidney Disease.    Anion gap 11 5 - 15    Comment: Performed at [facility] [address] [place], [place] [address]  Glucose, capillary     Status: Abnormal   Collection Time: 11/09/17  4:59 PM  Result Value Ref Range   Glucose-Capillary 154 (H) 65 - 99 mg/dL  Urine Culture     Status: Abnormal (Preliminary result)   Collection Time: 11/09/17  5:40 PM  Result Value Ref Range   Specimen Description URINE, CLEAN CATCH    Special Requests      NONE Performed at [facility], [place]  [address] [place], [place] [address]    Culture (A)     30,000 COLONIES/mL GRAM NEGATIVE RODS 80,000 COLONIES/mL PROTEUS MIRABILIS    Report Status PENDING   Urinalysis, Routine w reflex microscopic     Status: Abnormal   Collection Time: 11/09/17  6:33 PM  Result Value Ref Range   Color, Urine STRAW (A) YELLOW   APPearance CLEAR CLEAR   Specific Gravity, Urine 1.003 (L) 1.005 - 1.030   pH 6.0 5.0 - 8.0   Glucose, UA NEGATIVE NEGATIVE mg/dL   Hgb urine dipstick NEGATIVE NEGATIVE   Bilirubin Urine NEGATIVE NEGATIVE   Ketones, ur NEGATIVE NEGATIVE mg/dL   Protein, ur NEGATIVE NEGATIVE mg/dL   Nitrite NEGATIVE NEGATIVE   Leukocytes, UA NEGATIVE NEGATIVE    Comment: Performed at [facility] [address] [place], [place] [address]  Glucose, capillary     Status: Abnormal   Collection Time: 11/09/17  9:36 PM  Result Value Ref Range   Glucose-Capillary 160 (H) 65 - 99 mg/dL  Glucose, capillary     Status: Abnormal   Collection Time: 11/10/17  6:44 AM  Result Value Ref Range   Glucose-Capillary 113 (H) 65 - 99  mg/dL  Glucose, capillary     Status: Abnormal   Collection Time: 11/10/17 12:01 PM  Result Value Ref Range   Glucose-Capillary 139 (H) 65 - 99 mg/dL  Glucose, capillary     Status: Abnormal   Collection Time: 11/10/17  4:43 PM  Result Value Ref Range   Glucose-Capillary 163 (H) 65 - 99 mg/dL  Glucose, capillary     Status: Abnormal   Collection Time: 11/10/17  9:25 PM  Result Value Ref Range   Glucose-Capillary 171 (H) 65 - 99 mg/dL  Creatinine, serum     Status: Abnormal   Collection Time: 11/11/17  6:10 AM  Result Value Ref Range   Creatinine, Ser 1.31 (H) 0.44 - 1.00 mg/dL   GFR calc non Af Amer 37 (L) >60 mL/min   GFR calc Af Amer 42 (L) >60 mL/min    Comment: (NOTE) The eGFR has been calculated using the CKD EPI equation. This calculation has not been validated in all clinical situations. eGFR's persistently <60 mL/min signify possible Chronic Kidney Disease. Performed at [facility], [place] [address], [place], [place] [address]   Glucose, capillary     Status: Abnormal   Collection Time: 11/11/17  6:50 AM  Result Value Ref Range   Glucose-Capillary 118 (H) 65 - 99 mg/dL     Constitutional: No distress . Vital signs reviewed. HEENT: EOMI, oral membranes moist Neck: supple Cardiovascular: RRR without murmur. No JVD    Respiratory: CTA Bilaterally without wheezes or rales. Normal effort    GI: BS +, non-tender, non-distended  Skin:   Other PICC site is clean, no erythema Neuro: Alert/Oriented, Abnormal Motor 4/5 in RUE and RLE , 5/5 on Left side and Abnormal FMC Ataxic/ dec FMC. Speech dysarthric Musc/Skel:  Other No pain with UE or LE ROM Gen NAD   Assessment/Plan: 1. Functional deficits secondary to RIght hemiataxia due Right cerebellar ICH which require 3+ hours per day of interdisciplinary therapy in a comprehensive inpatient rehab setting. Physiatrist is providing close team supervision and 24 hour management of active medical problems listed  below. Physiatrist and rehab team continue to assess barriers to discharge/monitor patient progress toward functional and medical goals. FIM: Function - Bathing Position: Shower Body parts bathed by patient: Right arm, Left arm,  Chest, Abdomen, Front perineal area, Right upper leg, Left upper leg, Right lower leg, Left lower leg Body parts bathed by helper: Buttocks, Back Assist Level: Touching or steadying assistance(Pt > 75%)  Function- Upper Body Dressing/Undressing What is the patient wearing?: Pull over shirt/dress Bra - Perfomed by patient: Thread/unthread right bra strap, Thread/unthread left bra strap Bra - Perfomed by helper: Hook/unhook bra (pull down sports bra) Pull over shirt/dress - Perfomed by patient: Thread/unthread right sleeve, Thread/unthread left sleeve, Put head through opening, Pull shirt over trunk Pull over shirt/dress - Perfomed by helper: Pull shirt over trunk Assist Level: Supervision or verbal cues Function - Lower Body Dressing/Undressing What is the patient wearing?: Pants, Underwear Position: Wheelchair/chair at sink Underwear - Performed by patient: Thread/unthread right underwear leg, Thread/unthread left underwear leg, Pull underwear up/down Underwear - Performed by helper: Thread/unthread left underwear leg, Pull underwear up/down Pants- Performed by patient: Thread/unthread right pants leg, Thread/unthread left pants leg, Pull pants up/down Pants- Performed by helper: Pull pants up/down Non-skid slipper socks- Performed by helper: Don/doff right sock, Don/doff left sock Assist for footwear: Maximal assist Assist for lower body dressing: Touching or steadying assistance (Pt > 75%)  Function - Toileting Toileting activity did not occur: No continent bowel/bladder event Toileting steps completed by patient: Adjust clothing prior to toileting, Performs perineal hygiene, Adjust clothing after toileting Toileting steps completed by helper: Adjust clothing  prior to toileting, Performs perineal hygiene, Adjust clothing after toileting Toileting Assistive Devices: Grab bar or rail Assist level: Touching or steadying assistance (Pt.75%)  Function - Air cabin crew transfer assistive device: Grab bar, Walker Assist level to toilet: Touching or steadying assistance (Pt > 75%) Assist level from toilet: Touching or steadying assistance (Pt > 75%)  Function - Chair/bed transfer Chair/bed transfer method: Stand pivot Chair/bed transfer assist level: Touching or steadying assistance (Pt > 75%) Chair/bed transfer assistive device: Armrests, Bedrails  Function - Locomotion: Wheelchair Type: Manual Max wheelchair distance: 30 ft  Assist Level: Touching or steadying assistance (Pt > 75%) Assist Level: Dependent (Pt equals 0%) Assist Level: Dependent (Pt equals 0%) Turns around,maneuvers to table,bed, and toilet,negotiates 3% grade,maneuvers on rugs and over doorsills: No Function - Locomotion: Ambulation Assistive device: Walker-rolling Max distance: 78 Assist level: Touching or steadying assistance (Pt > 75%) Walk 10 feet activity did not occur: Safety/medical concerns Assist level: Touching or steadying assistance (Pt > 75%) Walk 50 feet with 2 turns activity did not occur: Safety/medical concerns Assist level: Touching or steadying assistance (Pt > 75%) Walk 150 feet activity did not occur: Safety/medical concerns Walk 10 feet on uneven surfaces activity did not occur: Safety/medical concerns  Function - Comprehension Comprehension: Auditory Comprehension assist level: Follows basic conversation/direction with extra time/assistive device  Function - Expression Expression: Verbal Expression assist level: Expresses basic 50 - 74% of the time/requires cueing 25 - 49% of the time. Needs to repeat parts of sentences., Expresses basic 25 - 49% of the time/requires cueing 50 - 75% of the time. Uses single words/gestures.  Function -  Social Interaction Social Interaction assist level: Interacts appropriately 90% of the time - Needs monitoring or encouragement for participation or interaction.  Function - Problem Solving Problem solving assist level: Solves basic 50 - 74% of the time/requires cueing 25 - 49% of the time, Solves basic 25 - 49% of the time - needs direction more than half the time to initiate, plan or complete simple activities  Function - Memory Memory assist level: Recognizes or recalls 50 -  74% of the time/requires cueing 25 - 49% of the time, Recognizes or recalls 25 - 49% of the time/requires cueing 50 - 75% of the time Patient normally able to recall (first 3 days only): Current season, Location of own room, That he or she is in a hospital, Staff names and faces  Medical Problem List and Plan: 1.Altered mental status with lethargysecondary to right cerebellar parenchymal hemorrhage felt to be secondary to hypertensive crisis  PT, OT, SLP  2. DVT Prophylaxis/Anticoagulation: Subcutaneous Lovenox 30 mg daily initiated 11/01/2017 3. Pain Management:Tylenol as needed 4. Mood:Provided pt positive emotional support.  5. Neuropsych: This patientiscapable of making decisions on herown behalf. 6. Skin/Wound Care:Routine skin checks 7. 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 Not a candidate for CABG. Patient cleared to begin aspirin 81 mg daily 11/05/2017.Plan for PCI as an outpatient follow-up 9.Decompensated systolic congestive heart failure. Monitor for any signs of fluid overload. Lasix 40 mg daily  -need to record weights Filed Weights   11/04/17 1832 11/04/17 2201 11/10/17 1406  Weight: 60.8 kg (134 lb 0.6 oz) 60.8 kg (134 lb 0.6 oz) 60.4 kg (133 lb 2.5 oz)    10.Hypertension. Coreg 12.5 mg twice daily, Altace 10 mg twice daily, Aldactone 25 mg daily, Nitro-Dur patch 0.2 mg daily Vitals:   11/10/17  2126 11/11/17 0433  BP: (!) 158/69 (!) 167/70  Pulse: 63 (!) 59  Resp: 16 17  Temp: 98.4 F (36.9 C) (!) 97.5 F (36.4 C)  SpO2: 94% 92%  controlled 5/23 11.Diabetes mellitus with peripheral neuropathy. Levemir 6 units nightly. Checking blood sugars before meals and at bedtime CBG (last 3)  Recent Labs    11/10/17 1643 11/10/17 2125 11/11/17 0650  GLUCAP 163* 171* 118*  good inhospital control--5/23 12.History of breast, colon, endometrial and cervical cancer. Continue hydroxyurea 13 Mild HypoK due to lasix will increase KCL  Supplement to 46mq 14.  Leukocytosis- Polycythemia Vera Dr CMike Gipis hematologist LOS (Days) 7[place]5/23/2019, 8:34 AM

## 2017-11-11 NOTE — Progress Notes (Signed)
Social Work Patient ID: [name], female   DOB: [birth year], 84 y.o.   MRN: [id number]  Spoke with Collie Siad Ellen-pt's niece to discuss team conference goals supervision level and target discharge date 5/29. Made aware pt will require someone with her at home for safety and due to memory issues. She was wanting to know when the Cardologist would plan to do the stents. Asked Dan-PA he reports Dr. Terrence Dupont will follow as an OP and decide then once pt is stronger. Niece made aware of this. Discussed options hired assist versus SNF. She will discuss with pt and sister and see what can be arranged and get back with this worker their decision. Pt is aware of this and will talk with them about best option for her.

## 2017-11-12 ENCOUNTER — Inpatient Hospital Stay (HOSPITAL_COMMUNITY): Payer: Medicare Other | Admitting: Occupational Therapy

## 2017-11-12 ENCOUNTER — Inpatient Hospital Stay (HOSPITAL_COMMUNITY): Payer: Medicare Other | Admitting: Speech Pathology

## 2017-11-12 ENCOUNTER — Inpatient Hospital Stay (HOSPITAL_COMMUNITY): Payer: Medicare Other | Admitting: Physical Therapy

## 2017-11-12 LAB — URINE CULTURE: Culture: 30000 — AB

## 2017-11-12 LAB — GLUCOSE, CAPILLARY
Glucose-Capillary: 130 mg/dL — ABNORMAL HIGH (ref 65–99)
Glucose-Capillary: 175 mg/dL — ABNORMAL HIGH (ref 65–99)
Glucose-Capillary: 220 mg/dL — ABNORMAL HIGH (ref 65–99)
Glucose-Capillary: 169 mg/dL — ABNORMAL HIGH (ref 65–99)

## 2017-11-12 NOTE — Progress Notes (Signed)
Social Work Patient ID: [name], female   DOB: [birth year], 84 y.o.   MRN: [id number]  Met with pt and niece who was here to discuss discharge plans. Niece has found an agency who can provide 24 hr lie-in care and wanted to know if this would meet pt's needs. Spoke with Victoria-PT and she reports as long as someone is there and she can get them to assist it will. Made niece aware and she will work on this. She plans to be here on Monday and will discuss more and have her go through therapies with pt. Goal is to get pt home on Wednesday.

## 2017-11-12 NOTE — Progress Notes (Signed)
Occupational Therapy Weekly Progress Note  Patient Details  Name: [name] MRN: [id number] Date of Birth: 05/03/1932  Beginning of progress report period: Nov 05, 2017 End of progress report period: Nov 12, 2017  Today's Date: 11/12/2017 OT Individual Time: 0932-6712 OT Individual Time Calculation (min): 54 min    Patient has met 5 of 5 short term goals.  Pt Is making steady progress towards OT goals. She cont to be limited by extreme fatigue, requiring rest breaks throughout basic seated ADL tasks and requiring increased time to complete all tasks.  Pt therapy schedule modified to 15/7 this reporting period.  She is able to move and complete basic ADLs at close supervision level using RW with VCs for RW management in functional context. She requires encouragement and education for participation in therapy activities.  Pt will require 24 hr supervision assist at d/c. Pt lived alone PTA and now choosing btwn hired held or transition to ALF.   Patient continues to demonstrate the following deficits: abnormal posture, apraxia, cognitive deficits, hemiplegia affecting non-dominant side, muscle weakness (generalized) and coordination disorder and therefore will continue to benefit from skilled OT intervention to enhance overall performance with BADL and Reduce care partner burden.  Patient progressing toward long term goals..  Continue plan of care.  OT Short Term Goals Week 1:  OT Short Term Goal 1 (Week 1): Pt will sit to stand with min A to advance pants past hips wiht LRAD OT Short Term Goal 1 - Progress (Week 1): Met OT Short Term Goal 2 (Week 1): Pt will don socks with AE PRN and supervision OT Short Term Goal 2 - Progress (Week 1): Met OT Short Term Goal 3 (Week 1): Pt will groom at sink in standing for 2 items to demo improved standing endurance OT Short Term Goal 3 - Progress (Week 1): Met OT Short Term Goal 4 (Week 1): Pt will complete toilet transfer with MIN A and LRAD OT  Short Term Goal 4 - Progress (Week 1): Met OT Short Term Goal 5 (Week 1): Pt will don shirt with supervision OT Short Term Goal 5 - Progress (Week 1): Met Week 2:  OT Short Term Goal 1 (Week 2): STG=LTG due to LOS  Skilled Therapeutic Interventions/Progress Updates:    Pt seen for OT ADL bathing/dressing session. Pt sitting upright in bed upon arrival finishing lunch. Encouraged use of L UE at stabilzer level to hold ice cream cup during attempts of self feeding. She transferred to EOB with min A using hospital bed functions. Ambulated throughout session with RW and close supervision. VCs for RW management in functional context. She completed functional transfers throughout session with guarding assist, assist required for controlled descent onto lower surfaces.  Pt bathed seated on tub bench in shower, VCs to attend to all areas of body and sequencing of task.  She returned to w/c to dress, requiring significantly increased time and rst breaks due to decreased functional activity tolerance. VCs for deep breathing technique when fatigued as pt beginning to sob from complaints of fatigue. Following rest break once finished dressing, pt requesting to return to supine. Pt left in supine with HOB raised, pt then stated " now leave me alone". Pt left in supine with all needs in reach, bed alarm on with all needs in reach.   Therapy Documentation Precautions:  Precautions Precautions: Fall Precaution Comments: h/o CA and several falls Restrictions Weight Bearing Restrictions: No Pain:   No/denies pain  See Function Navigator  for Current Functional Status.   Therapy/Group: Individual Therapy  Vesna Kable L 11/12/2017, 6:59 AM

## 2017-11-12 NOTE — Progress Notes (Signed)
Subjective/Complaints:  Patient niece has discussed insulin management as well as resumption of Plavix with nursing.  ROS: Patient denies N/V/D, SOB, CP jt pain   Objective: Vital Signs: Blood pressure (!) 170/76, pulse 66, temperature 97.8 F (36.6 C), temperature source Oral, resp. rate 17, height 5' 1"  (1.549 m), weight 60.4 kg (133 lb 2.5 oz), SpO2 97 %. No results found. Results for orders placed or performed during the hospital encounter of 11/04/17 (from the past 72 hour(s))  Glucose, capillary     Status: Abnormal   Collection Time: 11/09/17  9:36 PM  Result Value Ref Range   Glucose-Capillary 160 (H) 65 - 99 mg/dL  Glucose, capillary     Status: Abnormal   Collection Time: 11/10/17  6:44 AM  Result Value Ref Range   Glucose-Capillary 113 (H) 65 - 99 mg/dL  Glucose, capillary     Status: Abnormal   Collection Time: 11/10/17 12:01 PM  Result Value Ref Range   Glucose-Capillary 139 (H) 65 - 99 mg/dL  Glucose, capillary     Status: Abnormal   Collection Time: 11/10/17  4:43 PM  Result Value Ref Range   Glucose-Capillary 163 (H) 65 - 99 mg/dL  Glucose, capillary     Status: Abnormal   Collection Time: 11/10/17  9:25 PM  Result Value Ref Range   Glucose-Capillary 171 (H) 65 - 99 mg/dL  Creatinine, serum     Status: Abnormal   Collection Time: 11/11/17  6:10 AM  Result Value Ref Range   Creatinine, Ser 1.31 (H) 0.44 - 1.00 mg/dL   GFR calc non Af Amer 37 (L) >60 mL/min   GFR calc Af Amer 42 (L) >60 mL/min    Comment: (NOTE) The eGFR has been calculated using the CKD EPI equation. This calculation has not been validated in all clinical situations. eGFR's persistently <60 mL/min signify possible Chronic Kidney Disease. Performed at [facility], [place] [address] [place], [place] [address]   Glucose, capillary     Status: Abnormal   Collection Time: 11/11/17  6:50 AM  Result Value Ref Range   Glucose-Capillary 118 (H) 65 - 99 mg/dL  Glucose, capillary      Status: Abnormal   Collection Time: 11/11/17 11:57 AM  Result Value Ref Range   Glucose-Capillary 156 (H) 65 - 99 mg/dL  Glucose, capillary     Status: Abnormal   Collection Time: 11/11/17  4:33 PM  Result Value Ref Range   Glucose-Capillary 160 (H) 65 - 99 mg/dL  Glucose, capillary     Status: Abnormal   Collection Time: 11/11/17  9:49 PM  Result Value Ref Range   Glucose-Capillary 212 (H) 65 - 99 mg/dL  Glucose, capillary     Status: Abnormal   Collection Time: 11/12/17  6:14 AM  Result Value Ref Range   Glucose-Capillary 130 (H) 65 - 99 mg/dL  Glucose, capillary     Status: Abnormal   Collection Time: 11/12/17 11:19 AM  Result Value Ref Range   Glucose-Capillary 175 (H) 65 - 99 mg/dL  Glucose, capillary     Status: Abnormal   Collection Time: 11/12/17  4:44 PM  Result Value Ref Range   Glucose-Capillary 220 (H) 65 - 99 mg/dL     Constitutional: No distress . Vital signs reviewed. HEENT: EOMI, oral membranes moist Neck: supple Cardiovascular: RRR without murmur. No JVD    Respiratory: CTA Bilaterally without wheezes or rales. Normal effort    GI: BS +, non-tender, non-distended  Skin:  Other PICC site is clean, no erythema Neuro: Alert/Oriented, Abnormal Motor 4/5 in RUE and RLE , 5/5 on Left side and Abnormal FMC Ataxic/ dec FMC. Speech dysarthric Musc/Skel:  Other No pain with UE or LE ROM Gen NAD   Assessment/Plan: 1. Functional deficits secondary to RIght hemiataxia due Right cerebellar ICH which require 3+ hours per day of interdisciplinary therapy in a comprehensive inpatient rehab setting. Physiatrist is providing close team supervision and 24 hour management of active medical problems listed below. Physiatrist and rehab team continue to assess barriers to discharge/monitor patient progress toward functional and medical goals. FIM: Function - Bathing Position: Shower Body parts bathed by patient: Right arm, Left arm, Chest, Abdomen, Front perineal area, Right  upper leg, Left upper leg, Right lower leg, Left lower leg, Buttocks Body parts bathed by helper: Back Assist Level: Supervision or verbal cues  Function- Upper Body Dressing/Undressing What is the patient wearing?: Pull over shirt/dress Bra - Perfomed by patient: Thread/unthread right bra strap, Thread/unthread left bra strap Bra - Perfomed by helper: Hook/unhook bra (pull down sports bra) Pull over shirt/dress - Perfomed by patient: Thread/unthread right sleeve, Thread/unthread left sleeve, Put head through opening, Pull shirt over trunk Pull over shirt/dress - Perfomed by helper: Pull shirt over trunk Assist Level: Set up Set up : To obtain clothing/put away Function - Lower Body Dressing/Undressing What is the patient wearing?: Pants, Underwear Position: Wheelchair/chair at sink Underwear - Performed by patient: Thread/unthread right underwear leg, Thread/unthread left underwear leg, Pull underwear up/down Underwear - Performed by helper: Thread/unthread left underwear leg, Pull underwear up/down Pants- Performed by patient: Thread/unthread right pants leg, Thread/unthread left pants leg, Pull pants up/down Pants- Performed by helper: Pull pants up/down Non-skid slipper socks- Performed by patient: Don/doff right sock, Don/doff left sock Non-skid slipper socks- Performed by helper: Don/doff right sock, Don/doff left sock Assist for footwear: Supervision/touching assist Assist for lower body dressing: Touching or steadying assistance (Pt > 75%)  Function - Toileting Toileting activity did not occur: No continent bowel/bladder event Toileting steps completed by patient: Adjust clothing prior to toileting, Performs perineal hygiene, Adjust clothing after toileting Toileting steps completed by helper: Adjust clothing prior to toileting, Performs perineal hygiene, Adjust clothing after toileting Toileting Assistive Devices: Grab bar or rail Assist level: Touching or steadying assistance  (Pt.75%)  Function - Air cabin crew transfer assistive device: Grab bar, Walker Assist level to toilet: Supervision or verbal cues Assist level from toilet: Supervision or verbal cues  Function - Chair/bed transfer Chair/bed transfer method: Stand pivot Chair/bed transfer assist level: Touching or steadying assistance (Pt > 75%) Chair/bed transfer assistive device: Armrests  Function - Locomotion: Wheelchair Type: Manual Max wheelchair distance: 40 ft  Assist Level: Supervision or verbal cues, Maximal assistance (Pt 25 - 49%) Assist Level: Dependent (Pt equals 0%) Assist Level: Dependent (Pt equals 0%) Turns around,maneuvers to table,bed, and toilet,negotiates 3% grade,maneuvers on rugs and over doorsills: No Function - Locomotion: Ambulation Assistive device: Walker-rolling Max distance: 10 ft  Assist level: Touching or steadying assistance (Pt > 75%) Walk 10 feet activity did not occur: Safety/medical concerns Assist level: Touching or steadying assistance (Pt > 75%) Walk 50 feet with 2 turns activity did not occur: Safety/medical concerns Assist level: Touching or steadying assistance (Pt > 75%) Walk 150 feet activity did not occur: Safety/medical concerns Walk 10 feet on uneven surfaces activity did not occur: Safety/medical concerns  Function - Comprehension Comprehension: Auditory Comprehension assist level: Understands basic 50 - 74% of the  time/ requires cueing 25 - 49% of the time  Function - Expression Expression: Verbal Expression assist level: Expresses basic 50 - 74% of the time/requires cueing 25 - 49% of the time. Needs to repeat parts of sentences.  Function - Social Interaction Social Interaction assist level: Interacts appropriately 50 - 74% of the time - May be physically or verbally inappropriate.  Function - Problem Solving Problem solving assist level: Solves basic 25 - 49% of the time - needs direction more than half the time to initiate, plan  or complete simple activities, Solves basic 50 - 74% of the time/requires cueing 25 - 49% of the time  Function - Memory Memory assist level: Recognizes or recalls 50 - 74% of the time/requires cueing 25 - 49% of the time Patient normally able to recall (first 3 days only): Current season, Location of own room, That he or she is in a hospital, Staff names and faces  Medical Problem List and Plan: 1.Altered mental status with lethargysecondary to right cerebellar parenchymal hemorrhage felt to be secondary to hypertensive crisis Continue aspirin 81 mg/day until June 7 and then may resume clopidogrel PT, OT, SLP  2. DVT Prophylaxis/Anticoagulation: Subcutaneous Lovenox 30 mg daily initiated 11/01/2017 3. Pain Management:Tylenol as needed 4. Mood:Provided pt positive emotional support.  5. Neuropsych: This patientiscapable of making decisions on herown behalf. 6. Skin/Wound Care:Routine skin checks 7. 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 Not a candidate for CABG. Patient cleared to begin aspirin 81 mg daily 11/05/2017.Plan for PCI as an outpatient follow-up 9.Decompensated systolic congestive heart failure. Monitor for any signs of fluid overload. Lasix 40 mg daily  -need to record weights Filed Weights   11/04/17 1832 11/04/17 2201 11/10/17 1406  Weight: 60.8 kg (134 lb 0.6 oz) 60.8 kg (134 lb 0.6 oz) 60.4 kg (133 lb 2.5 oz)    10.Hypertension. Coreg 12.5 mg twice daily, Altace 10 mg twice daily, Aldactone 25 mg daily, Nitro-Dur patch 0.2 mg daily Vitals:   11/12/17 1000 11/12/17 1755  BP:  (!) 170/76  Pulse: 61 66  Resp:    Temp:    SpO2: 97%   controlled 5/24 11.Diabetes mellitus with peripheral neuropathy. Levemir 6 units nightly. Checking blood sugars before meals and at bedtime CBG (last 3)  Recent Labs    11/12/17 0614 11/12/17 1119 11/12/17 1644  GLUCAP 130* 175*  220*  good inhospital control--5/23, will recheck creatinine in a.m. and if normal may resume metformin 12.History of breast, colon, endometrial and cervical cancer. Continue hydroxyurea 13 Mild HypoK due to lasix will increase KCL  Supplement to 23mq 14.  Leukocytosis- Polycythemia Vera Dr CMike Gipis hematologist LOS (Days) 8[place]5/24/2019, 7:02 PM

## 2017-11-12 NOTE — Progress Notes (Signed)
Physical Therapy Weekly Progress Note  Patient Details  Name: [name] MRN: [id number] Date of Birth: 09/25/1931  Beginning of progress report period: Nov 05, 2017 End of progress report period: Nov 12, 2017  Today's Date: 11/12/2017 PT Individual Time: 5809-9833 PT Individual Time Calculation (min): 53 min   Patient has met 3 of 3 short term goals.  Pt is making slow progress towards LTG's as she is limited by significant fatigue. Pt continues to require min assist overall with functional mobility and is intermittently ambulating short, household distances with RW. Pt would benefit from continued skilled PT treatment to focus on transfers, bed mobility, gait with RW, balance, and stair negotiation as pt has 2 steps with 1 rail only to enter her home. This therapist recommends 24 hr assist upon d/c and if pt's family plans to provide this they will need caregiver training prior to pt's d/c.   Patient continues to demonstrate the following deficits muscle weakness, decreased cardiorespiratoy endurance, decreased coordination, decreased problem solving, decreased safety awareness and decreased memory, and decreased standing balance, decreased postural control and decreased balance strategies and therefore will continue to benefit from skilled PT intervention to increase functional independence with mobility.  Patient progressing toward long term goals..  Continue plan of care.  PT Short Term Goals Week 1:  PT Short Term Goal 1 (Week 1): Pt will be able to perform functional transfers consistently with min assist PT Short Term Goal 1 - Progress (Week 1): Met PT Short Term Goal 2 (Week 1): Pt will be able to gait with LRAD x 75' with min assist PT Short Term Goal 2 - Progress (Week 1): Met(met only on 1 occasion) PT Short Term Goal 3 (Week 1): Pt will be able to perform 4 steps with bilateral rails with min assist for strengthening and increased muscular endurance PT Short Term Goal 3 -  Progress (Week 1): Met Week 2:  PT Short Term Goal 1 (Week 2): STG = LTG due to anticipated d/c date.  Skilled Therapeutic Interventions/Progress Updates:  Pt received in bed & agreeable to tx. Pt on room air & SpO2 = 97%, HR = 61 bpm. Pt transferred to EOB with hospital bed features & supervision, and stand pivot bed>w/c on R with min assist without AD. Set pt up at sink & pt combed hair with R UE with supervision from w/c level. Pt with poor recall as pt asking plans for session (stair negotiation) after being educated a few minutes prior. Transported pt to gym via w/c total assist for time management. Educated pt on compensatory pattern when negotiating stairs while holding to B rails with pt able to return demonstrate except for one step & required increased assistance for eccentric control as pt with decreased ability to support herself with RLE when descending with LLE first. Pt negotiates 8 steps with B rails & steady/min assist, then 4 steps laterally with L rail only with min assist.  After initial stair negotiation SpO2 = 97%, HR = 57 bpm. Pt would benefit from continued RLE (eccentric) strengthening to increase her safety & stability when descending stairs while holding to 1 rail only as LLE descends first in this manner. Educated pt on benefits of installing rail on R side at home; will f/u with family as able. Pt unable to attempt stair negotiation a 2nd time with L rail only 2/2 fatigue & pt noting "I'm pretty tuckered out". Pt propelled w/c with BUE & supervision for linear paths but max  assist for turning through doorways. Pt requires frequent rest breaks & propels short distances at a time. At end of session pt reported need to use restroom and ambulated in room to bathroom with RW & min assist. throughout session pt requires frequent cuing for hand placement for safe sit<>stand transfers. At end of session pt left in bathroom in handoff to NT.  Educated pt on supervision level goals.  Therapy  Documentation Precautions:  Precautions Precautions: Fall Precaution Comments: 2L O2 via [place]; h/o CA and several falls Restrictions Weight Bearing Restrictions: No    See Function Navigator for Current Functional Status.  Therapy/Group: Individual Therapy  [name] 11/12/2017, 10:51 AM

## 2017-11-12 NOTE — Progress Notes (Signed)
Speech Language Pathology Weekly Progress and Session Note  Patient Details  Name: [name] MRN: [id number] Date of Birth: 1931/12/31  Beginning of progress report period: Nov 05, 2017 End of progress report period: Nov 12, 2017  Today's Date: 11/12/2017 SLP Individual Time: 0800-0900 SLP Individual Time Calculation (min): 60 min  Short Term Goals: Week 1: SLP Short Term Goal 1 (Week 1): Pt will consume trials of regular solids with supervision cues for use of swallowing precautions and no observable signs of fatigue or changes in respiratory status.   SLP Short Term Goal 1 - Progress (Week 1): Discontinued (comment)(Pt not interested in trials of regular textures) SLP Short Term Goal 2 (Week 1): Pt will overarticulate and increase vocal intensity to achieve intelligibility at the sentence level with min cues for use of swallowing precautions.   SLP Short Term Goal 2 - Progress (Week 1): Not met SLP Short Term Goal 3 (Week 1): Pt will demonstrate sustained attention during functional task for 15 minutes with Min A verbal cues. SLP Short Term Goal 3 - Progress (Week 1): Met SLP Short Term Goal 4 (Week 1): Pt will recall basic,daily information with Mod A verbal cues for use of external aid.  SLP Short Term Goal 4 - Progress (Week 1): Not met SLP Short Term Goal 5 (Week 1): Pt will demonstrate funtional problem solving for basic and familar (mildly complex) tasks with Mod A verbal cues. SLP Short Term Goal 5 - Progress (Week 1): Met SLP Short Term Goal 6 (Week 1): Pt will self-monitor functional errors with Min A verbal cues.  SLP Short Term Goal 6 - Progress (Week 1): Not met    New Short Term Goals: Week 2: SLP Short Term Goal 1 (Week 2): Pt will overarticulate and increase vocal intensity to achieve intelligibility at the sentence level with min cues for use of swallowing precautions.   SLP Short Term Goal 2 (Week 2): Pt will demonstrate sustained attention during functional task  for 30 minutes with Min A verbal cues. SLP Short Term Goal 3 (Week 2): Pt will recall basic,daily information with Mod A verbal cues for use of external aid.  SLP Short Term Goal 4 (Week 2): Pt will demonstrate funtional problem solving for basic and familar (mildly complex) tasks with Min A verbal cues. SLP Short Term Goal 5 (Week 2): Pt will self-monitor functional errors with Min A verbal cues.   Weekly Progress Updates: Pt has made slow gains this reporting period in the area of sustained attention and problem solving. However pt continues to demonstrate poor task tolerance, decreased speech intelligibility and no desire to consume regular diet textures. Pt required Max A to understand need for therapies and for insight into deficits.      Intensity: Minumum of 1-2 x/day, 30 to 90 minutes Frequency: 3 to 5 out of 7 days Duration/Length of Stay: 10-12 days  Treatment/Interventions: Cognitive remediation/compensation;Cueing hierarchy;Dysphagia/aspiration precaution training;Environmental controls;Internal/external aids;Functional tasks;Patient/family education;Speech/Language facilitation   Daily Session  Skilled Therapeutic Interventions: Question possible language impairment d/t verbal dysfluencies phonemic perseverations and noncontent utterances. Pt also presents with decreased understanding of information and frequently requestes repetition of information, needs Max A to Mod A demonstration of verbal information and directions. Pt able to fluently name objects, object pictures and describe simple pictures approprirately. With unstructured speech such as describing her career pt with significant phonemic dysfluencies that decreased communicative abilities. Mod A cues needed for self-awareness but no effect in correcting dysfluencies. If pt does  have language dysfluencies, I believe it is related to overall cognitive dysfunction. With Min A to Mod A cues, and task set-up, pt able to complete  peg replication task but required Max A encouragement to complete task d/t poor task tolerance. SLP further facilitated session by providing skilled observation of pt consuming trials of graham crackers. Pt is resistant to trials of regular PO d/t driness of textures and driness of mouth. Resistance to cues for liquid wash. Cotninue current diet of dysphagia 3 with thin liquids.       Function:   Eating Eating   Modified Consistency Diet: No(trials of regular with SLP) Eating Assist Level: Set up assist for   Eating Set Up Assist For: Opening containers       Cognition Comprehension Comprehension assist level: Understands basic 50 - 74% of the time/ requires cueing 25 - 49% of the time  Expression   Expression assist level: Expresses basic 50 - 74% of the time/requires cueing 25 - 49% of the time. Needs to repeat parts of sentences.  Social Interaction Social Interaction assist level: Interacts appropriately 50 - 74% of the time - May be physically or verbally inappropriate.  Problem Solving Problem solving assist level: Solves basic 25 - 49% of the time - needs direction more than half the time to initiate, plan or complete simple activities;Solves basic 50 - 74% of the time/requires cueing 25 - 49% of the time  Memory Memory assist level: Recognizes or recalls 50 - 74% of the time/requires cueing 25 - 49% of the time   General    Pain    Therapy/Group: Individual Therapy  Gus Littler 11/12/2017, 10:51 AM

## 2017-11-13 ENCOUNTER — Inpatient Hospital Stay (HOSPITAL_COMMUNITY): Payer: Medicare Other

## 2017-11-13 ENCOUNTER — Inpatient Hospital Stay (HOSPITAL_COMMUNITY): Payer: Medicare Other | Admitting: Physical Therapy

## 2017-11-13 LAB — BASIC METABOLIC PANEL
Anion gap: 10 (ref 5–15)
BUN: 10 mg/dL (ref 6–20)
CO2: 32 mmol/L (ref 22–32)
Calcium: 8.8 mg/dL — ABNORMAL LOW (ref 8.9–10.3)
Chloride: 99 mmol/L — ABNORMAL LOW (ref 101–111)
Creatinine, Ser: 1.21 mg/dL — ABNORMAL HIGH (ref 0.44–1.00)
GFR calc Af Amer: 47 mL/min — ABNORMAL LOW (ref >60)
GFR calc non Af Amer: 40 mL/min — ABNORMAL LOW (ref >60)
Glucose, Bld: 122 mg/dL — ABNORMAL HIGH (ref 65–99)
Potassium: 3.4 mmol/L — ABNORMAL LOW (ref 3.5–5.1)
Sodium: 141 mmol/L (ref 135–145)

## 2017-11-13 LAB — GLUCOSE, CAPILLARY
Glucose-Capillary: 116 mg/dL — ABNORMAL HIGH (ref 65–99)
Glucose-Capillary: 171 mg/dL — ABNORMAL HIGH (ref 65–99)
Glucose-Capillary: 180 mg/dL — ABNORMAL HIGH (ref 65–99)
Glucose-Capillary: 171 mg/dL — ABNORMAL HIGH (ref 65–99)

## 2017-11-13 MED ORDER — NITROGLYCERIN 0.3 MG/HR TD PT24
0.3000 mg | MEDICATED_PATCH | Freq: Every day | TRANSDERMAL | Status: DC
  Administered 2017-11-14 – 2017-11-15 (×2): 0.3 mg via TRANSDERMAL
  Filled 2017-11-13 (×2): qty 1

## 2017-11-13 MED ORDER — POTASSIUM CHLORIDE CRYS ER 20 MEQ PO TBCR
40.0000 meq | EXTENDED_RELEASE_TABLET | Freq: Every day | ORAL | Status: DC
  Administered 2017-11-14 – 2017-11-17 (×4): 40 meq via ORAL
  Filled 2017-11-13 (×4): qty 2

## 2017-11-13 MED ORDER — PRAVASTATIN SODIUM 20 MG PO TABS
10.0000 mg | ORAL_TABLET | Freq: Every day | ORAL | Status: DC
  Administered 2017-11-13 – 2017-11-14 (×2): 10 mg via ORAL
  Filled 2017-11-13 (×2): qty 1

## 2017-11-13 NOTE — Progress Notes (Addendum)
Subjective/Complaints:  Pt states she vomited this am, no abd pain , denies diarrhea or constipation  ROS: Patient denies N/V/D, SOB, CP jt pain   Objective: Vital Signs: Blood pressure (!) 166/66, pulse (!) 55, temperature 98 F (36.7 C), temperature source Oral, resp. rate 18, height 5' 1"  (1.549 m), weight 60.4 kg (133 lb 2.5 oz), SpO2 95 %. No results found. Results for orders placed or performed during the hospital encounter of 11/04/17 (from the past 72 hour(s))  Glucose, capillary     Status: Abnormal   Collection Time: 11/10/17 12:01 PM  Result Value Ref Range   Glucose-Capillary 139 (H) 65 - 99 mg/dL  Glucose, capillary     Status: Abnormal   Collection Time: 11/10/17  4:43 PM  Result Value Ref Range   Glucose-Capillary 163 (H) 65 - 99 mg/dL  Glucose, capillary     Status: Abnormal   Collection Time: 11/10/17  9:25 PM  Result Value Ref Range   Glucose-Capillary 171 (H) 65 - 99 mg/dL  Creatinine, serum     Status: Abnormal   Collection Time: 11/11/17  6:10 AM  Result Value Ref Range   Creatinine, Ser 1.31 (H) 0.44 - 1.00 mg/dL   GFR calc non Af Amer 37 (L) >60 mL/min   GFR calc Af Amer 42 (L) >60 mL/min    Comment: (NOTE) The eGFR has been calculated using the CKD EPI equation. This calculation has not been validated in all clinical situations. eGFR's persistently <60 mL/min signify possible Chronic Kidney Disease. Performed at [facility], [place] [address], [place], [place] [address]   Glucose, capillary     Status: Abnormal   Collection Time: 11/11/17  6:50 AM  Result Value Ref Range   Glucose-Capillary 118 (H) 65 - 99 mg/dL  Glucose, capillary     Status: Abnormal   Collection Time: 11/11/17 11:57 AM  Result Value Ref Range   Glucose-Capillary 156 (H) 65 - 99 mg/dL  Glucose, capillary     Status: Abnormal   Collection Time: 11/11/17  4:33 PM  Result Value Ref Range   Glucose-Capillary 160 (H) 65 - 99 mg/dL  Glucose, capillary     Status: Abnormal   Collection Time: 11/11/17  9:49 PM  Result Value Ref Range   Glucose-Capillary 212 (H) 65 - 99 mg/dL  Glucose, capillary     Status: Abnormal   Collection Time: 11/12/17  6:14 AM  Result Value Ref Range   Glucose-Capillary 130 (H) 65 - 99 mg/dL  Glucose, capillary     Status: Abnormal   Collection Time: 11/12/17 11:19 AM  Result Value Ref Range   Glucose-Capillary 175 (H) 65 - 99 mg/dL  Glucose, capillary     Status: Abnormal   Collection Time: 11/12/17  4:44 PM  Result Value Ref Range   Glucose-Capillary 220 (H) 65 - 99 mg/dL  Glucose, capillary     Status: Abnormal   Collection Time: 11/12/17  9:45 PM  Result Value Ref Range   Glucose-Capillary 169 (H) 65 - 99 mg/dL  Glucose, capillary     Status: Abnormal   Collection Time: 11/13/17  6:35 AM  Result Value Ref Range   Glucose-Capillary 116 (H) 65 - 99 mg/dL  Basic metabolic panel     Status: Abnormal   Collection Time: 11/13/17  6:58 AM  Result Value Ref Range   Sodium 141 135 - 145 mmol/L   Potassium 3.4 (L) 3.5 - 5.1 mmol/L   Chloride 99 (L) 101 -  111 mmol/L   CO2 32 22 - 32 mmol/L   Glucose, Bld 122 (H) 65 - 99 mg/dL   BUN 10 6 - 20 mg/dL   Creatinine, Ser 1.21 (H) 0.44 - 1.00 mg/dL   Calcium 8.8 (L) 8.9 - 10.3 mg/dL   GFR calc non Af Amer 40 (L) >60 mL/min   GFR calc Af Amer 47 (L) >60 mL/min    Comment: (NOTE) The eGFR has been calculated using the CKD EPI equation. This calculation has not been validated in all clinical situations. eGFR's persistently <60 mL/min signify possible Chronic Kidney Disease.    Anion gap 10 5 - 15    Comment: Performed at [facility] [address], [place], [place] [address]     Constitutional: No distress . Vital signs reviewed. HEENT: EOMI, oral membranes moist Neck: supple Cardiovascular: RRR without murmur. No JVD    Respiratory: CTA Bilaterally without wheezes or rales. Normal effort    GI: BS +, non-tender, non-distended  Skin:   Other PICC site is clean, no  erythema Neuro: Alert/Oriented, Abnormal Motor 4/5 in RUE and RLE , 5/5 on Left side and Abnormal FMC Ataxic/ dec FMC. Speech dysarthric Musc/Skel:  Other No pain with UE or LE ROM Gen NAD   Assessment/Plan: 1. Functional deficits secondary to RIght hemiataxia due Right cerebellar ICH which require 3+ hours per day of interdisciplinary therapy in a comprehensive inpatient rehab setting. Physiatrist is providing close team supervision and 24 hour management of active medical problems listed below. Physiatrist and rehab team continue to assess barriers to discharge/monitor patient progress toward functional and medical goals. FIM: Function - Bathing Position: Shower Body parts bathed by patient: Right arm, Left arm, Chest, Abdomen, Front perineal area, Right upper leg, Left upper leg, Right lower leg, Left lower leg, Buttocks Body parts bathed by helper: Back Assist Level: Supervision or verbal cues  Function- Upper Body Dressing/Undressing What is the patient wearing?: Pull over shirt/dress Bra - Perfomed by patient: Thread/unthread right bra strap, Thread/unthread left bra strap Bra - Perfomed by helper: Hook/unhook bra (pull down sports bra) Pull over shirt/dress - Perfomed by patient: Thread/unthread right sleeve, Thread/unthread left sleeve, Put head through opening, Pull shirt over trunk Pull over shirt/dress - Perfomed by helper: Pull shirt over trunk Assist Level: Set up Set up : To obtain clothing/put away Function - Lower Body Dressing/Undressing What is the patient wearing?: Pants, Underwear Position: Wheelchair/chair at sink Underwear - Performed by patient: Thread/unthread right underwear leg, Thread/unthread left underwear leg, Pull underwear up/down Underwear - Performed by helper: Thread/unthread left underwear leg, Pull underwear up/down Pants- Performed by patient: Thread/unthread right pants leg, Thread/unthread left pants leg, Pull pants up/down Pants- Performed by  helper: Pull pants up/down Non-skid slipper socks- Performed by patient: Don/doff right sock, Don/doff left sock Non-skid slipper socks- Performed by helper: Don/doff right sock, Don/doff left sock Assist for footwear: Supervision/touching assist Assist for lower body dressing: Touching or steadying assistance (Pt > 75%)  Function - Toileting Toileting activity did not occur: No continent bowel/bladder event Toileting steps completed by patient: Adjust clothing prior to toileting, Performs perineal hygiene, Adjust clothing after toileting Toileting steps completed by helper: Adjust clothing prior to toileting, Performs perineal hygiene, Adjust clothing after toileting Toileting Assistive Devices: Grab bar or rail Assist level: Touching or steadying assistance (Pt.75%)  Function - Air cabin crew transfer assistive device: Grab bar, Walker Assist level to toilet: Supervision or verbal cues Assist level from toilet: Supervision or  verbal cues  Function - Chair/bed transfer Chair/bed transfer method: Stand pivot Chair/bed transfer assist level: Touching or steadying assistance (Pt > 75%) Chair/bed transfer assistive device: Armrests  Function - Locomotion: Wheelchair Type: Manual Max wheelchair distance: 40 ft  Assist Level: Supervision or verbal cues, Maximal assistance (Pt 25 - 49%) Assist Level: Dependent (Pt equals 0%) Assist Level: Dependent (Pt equals 0%) Turns around,maneuvers to table,bed, and toilet,negotiates 3% grade,maneuvers on rugs and over doorsills: No Function - Locomotion: Ambulation Assistive device: Walker-rolling Max distance: 10 ft  Assist level: Touching or steadying assistance (Pt > 75%) Walk 10 feet activity did not occur: Safety/medical concerns Assist level: Touching or steadying assistance (Pt > 75%) Walk 50 feet with 2 turns activity did not occur: Safety/medical concerns Assist level: Touching or steadying assistance (Pt > 75%) Walk 150 feet  activity did not occur: Safety/medical concerns Walk 10 feet on uneven surfaces activity did not occur: Safety/medical concerns  Function - Comprehension Comprehension: Auditory Comprehension assist level: Understands basic 50 - 74% of the time/ requires cueing 25 - 49% of the time  Function - Expression Expression: Verbal Expression assist level: Expresses basic 50 - 74% of the time/requires cueing 25 - 49% of the time. Needs to repeat parts of sentences.  Function - Social Interaction Social Interaction assist level: Interacts appropriately 50 - 74% of the time - May be physically or verbally inappropriate.  Function - Problem Solving Problem solving assist level: Solves basic 25 - 49% of the time - needs direction more than half the time to initiate, plan or complete simple activities, Solves basic 50 - 74% of the time/requires cueing 25 - 49% of the time  Function - Memory Memory assist level: Recognizes or recalls 50 - 74% of the time/requires cueing 25 - 49% of the time Patient normally able to recall (first 3 days only): Current season, Location of own room, That he or she is in a hospital, Staff names and faces  Medical Problem List and Plan: 1.Altered mental status with lethargysecondary to right cerebellar parenchymal hemorrhage felt to be secondary to hypertensive crisis Continue aspirin 81 mg/day until June 7 and then may resume clopidogrel PT, OT, SLP  2. DVT Prophylaxis/Anticoagulation: Subcutaneous Lovenox 30 mg daily initiated 11/01/2017 3. Pain Management:Tylenol as needed 4. Mood:Provided pt positive emotional support.  5. Neuropsych: This patientiscapable of making decisions on herown behalf. 6. Skin/Wound Care:Routine skin checks 7. 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 Not a candidate for CABG. Patient cleared to begin aspirin 81 mg daily 11/05/2017.Plan for PCI as an  outpatient follow-up 9.Decompensated systolic congestive heart failure. Monitor for any signs of fluid overload. Lasix 40 mg daily  -need to record weights Filed Weights   11/04/17 1832 11/04/17 2201 11/10/17 1406  Weight: 60.8 kg (134 lb 0.6 oz) 60.8 kg (134 lb 0.6 oz) 60.4 kg (133 lb 2.5 oz)    10.Hypertension. Coreg 12.5 mg twice daily, Altace 10 mg twice daily, Aldactone 25 mg daily, Nitro-Dur patch 0.2 mg daily Vitals:   11/12/17 2052 11/13/17 0444  BP: (!) 172/66 (!) 166/66  Pulse: (!) 49 (!) 55  Resp: 18 18  Temp: 98 F (36.7 C)   SpO2: 94% 95%  elevated  5/25 will adjust meds, increase NTG patch 11.Diabetes mellitus with peripheral neuropathy. Levemir 6 units nightly. Checking blood sugars before meals and at bedtime CBG (last 3)  Recent Labs    11/12/17 1644 11/12/17 2145 11/13/17 0635  GLUCAP 220* 169*  116*  Some fluctuation but may resume metformin 12.History of breast, colon, endometrial and cervical cancer. Continue hydroxyurea 13 Mild HypoK due to lasix will increase KCL  Supplement to 65mq, 14.  Leukocytosis- Polycythemia Vera Dr CMike Gipis hematologist 15.  HLD- had muscle pain wit simvastatin, took pravastatin at home will restart LOS (Days) 9 A FACE TO FACE EVALUATION WAS PERFORMED  ACharlett Blake5/25/2019, 9:34 AM

## 2017-11-13 NOTE — Progress Notes (Signed)
Occupational Therapy Session Note  Patient Details  Name: [name] MRN: [id number] Date of Birth: 1931/11/02  Today's Date: 11/13/2017 OT Individual Time: 7185-5015 OT Individual Time Calculation (min): 75 min    Short Term Goals: Week 1:  OT Short Term Goal 1 (Week 1): Pt will sit to stand with min A to advance pants past hips wiht LRAD OT Short Term Goal 1 - Progress (Week 1): Met OT Short Term Goal 2 (Week 1): Pt will don socks with AE PRN and supervision OT Short Term Goal 2 - Progress (Week 1): Met OT Short Term Goal 3 (Week 1): Pt will groom at sink in standing for 2 items to demo improved standing endurance OT Short Term Goal 3 - Progress (Week 1): Met OT Short Term Goal 4 (Week 1): Pt will complete toilet transfer with MIN A and LRAD OT Short Term Goal 4 - Progress (Week 1): Met OT Short Term Goal 5 (Week 1): Pt will don shirt with supervision OT Short Term Goal 5 - Progress (Week 1): Met  Skilled Therapeutic Interventions/Progress Updates:     1:1. Pt initially questioning importance of therapy and encouraged to participate in functional tasks to improve endurance and independence with BADls. Pt grooming at sink seated with superviison cueing for initaiation of tasks. Pt dons clothing with supervision although pt often asking, "whats next" requiring subtle cues to do things as she would at home. Ot educates on doning pants in seated to decrease fall risk. Pt ambulates with RW and supervision to bathroom to complete toileting bladder with supervision. Attempted to engage pt in making brewed coffee, however after standing and demo difficulty orienting plug into electric socket pt becomes frustrated/tired and refuses despite encouragement. Pt completes 9HPT L:40 seconds R 1 min 26 seconds. Pt able to stae while completing the assessment that "my L is so much slower." Educated pt that OT works on NMR/coordination to improve UE function. Pt completes threading beads onto string with  supervision, dropping5/15 beads d/t decreased dexterity. After pt transfers back to bed, pt with episode of emesis/dry heaves. RN alerted and entering room and OT exits  Therapy Documentation Precautions:  Precautions Precautions: Fall Precaution Comments: 2L O2 via [place]; h/o CA and several falls Restrictions Weight Bearing Restrictions: No General:  See Function Navigator for Current Functional Status.   Therapy/Group: Individual Therapy  Tonny Branch 11/13/2017, 7:13 AM

## 2017-11-13 NOTE — Progress Notes (Signed)
Physical Therapy Session Note  Patient Details  Name: [name] MRN: [id number] Date of Birth: May 27, 1932  Today's Date: 11/13/2017 PT Individual Time: 1300-1400 PT Individual Time Calculation (min): 60 min   Short Term Goals: Week 2:  PT Short Term Goal 1 (Week 2): STG = LTG due to anticipated d/c date.  Skilled Therapeutic Interventions/Progress Updates:    no c/o pain but reports fatigue.  Session focus on therapeutic activities to increase strengthening, overall endurance, and for cognitive remediation. Pt requires increased time for all tasks throughout session.    Pt transfers throughout session with RW (sit<>stand and stand/pivot) with supervision and verbal cues for hand placement.  Repeated sit<>stand 2x5 reps focus on power up and slow/eccentric control for descent with HHA for balance and cues for technique.  Pt requires extended seated rest break between trials due to fatigue.  RUE NMR/strengthening and cognitive task with graded peg board activity.  Pt able to manipulate pegs in RUE and place with occasional assist from LUE as a stabilizer, with min<>mod cues for correct placement of pegs.  Pt propels w/c x50' +20' with BUEs for strengthening and overall activity tolerance.  Returned to room at end of session for toileting.  Ambulatory transfer into bathroom with RW and supervision.  Pt completes toileting tasks with supervision and returned to bed, min cues for eccentric control during sit.  Positioned in supine with supervision, call bell in reach and needs met.    Therapy Documentation Precautions:  Precautions Precautions: Fall Precaution Comments: 2L O2 via [place]; h/o CA and several falls Restrictions Weight Bearing Restrictions: No   See Function Navigator for Current Functional Status.   Therapy/Group: Individual Therapy  Michel Santee 11/13/2017, 2:02 PM

## 2017-11-14 ENCOUNTER — Inpatient Hospital Stay (HOSPITAL_COMMUNITY): Payer: Medicare Other | Admitting: Occupational Therapy

## 2017-11-14 ENCOUNTER — Inpatient Hospital Stay (HOSPITAL_COMMUNITY): Payer: Medicare Other

## 2017-11-14 LAB — GLUCOSE, CAPILLARY
Glucose-Capillary: 139 mg/dL — ABNORMAL HIGH (ref 65–99)
Glucose-Capillary: 142 mg/dL — ABNORMAL HIGH (ref 65–99)
Glucose-Capillary: 159 mg/dL — ABNORMAL HIGH (ref 65–99)
Glucose-Capillary: 158 mg/dL — ABNORMAL HIGH (ref 65–99)

## 2017-11-14 NOTE — Progress Notes (Signed)
Physical Therapy Session Note  Patient Details  Name: ETHA STAMBAUGH MRN: [id number] Date of Birth: [birth year]  Today's Date: 11/14/2017 PT Individual Time: 1300-1355 PT Individual Time Calculation (min): 55 min   Short Term Goals: Week 2:  PT Short Term Goal 1 (Week 2): STG = LTG due to anticipated d/c date.  Skilled Therapeutic Interventions/Progress Updates:    Pt supine in bed upon PT arrival, agreeable to therapy tx and denies pain. Pt transferred from supine>sitting EOB with supervision, verbal cues for techniques. Pt performed stand pivot from bed>w/c with min assist, transported to dayroom. Pt performed stand pivot from w/c<>nustep, pt used nustep x 5 minutes on workload 4 for endurance and activity tolerance. Pt reports having a lot of company today and she feels tired. Pt ambulated 2 x 40 ft in the gym with RW and min assist. Pt ascended/descended 4 steps with B handrails, step to pattern with verbal cues for techniques/safety. Pt performed standing toe taps on 2 inch step with RW working on dynamic standing balance. Pt performed seated therex for strengthening including 2 x 10 each: seated marches, LAQ and hamstring curls with theraband. Pt transported back to room and transferred to bed stand pivot with min assist. Pt transferred to supine with supervision and left with bed alarm set, needs in reach.   Therapy Documentation Precautions:  Precautions Precautions: Fall Precaution Comments: 2L O2 via [place]; h/o CA and several falls Restrictions Weight Bearing Restrictions: No   See Function Navigator for Current Functional Status.   Therapy/Group: Individual Therapy  Netta Corrigan, PT, DPT 11/14/2017, 12:41 PM

## 2017-11-14 NOTE — Progress Notes (Signed)
Subjective/Complaints:  No issues overnite, pt states she does not look forward to therapy (makes her tired)  ROS: Patient denies N/V/D, SOB, CP jt pain   Objective: Vital Signs: Blood pressure (!) 158/63, pulse (!) 57, temperature 97.8 F (36.6 C), temperature source Oral, resp. rate 18, height 5' 1"  (1.549 m), weight 60.4 kg (133 lb 2.5 oz), SpO2 95 %. No results found. Results for orders placed or performed during the hospital encounter of 11/04/17 (from the past 72 hour(s))  Glucose, capillary     Status: Abnormal   Collection Time: 11/11/17 11:57 AM  Result Value Ref Range   Glucose-Capillary 156 (H) 65 - 99 mg/dL  Glucose, capillary     Status: Abnormal   Collection Time: 11/11/17  4:33 PM  Result Value Ref Range   Glucose-Capillary 160 (H) 65 - 99 mg/dL  Glucose, capillary     Status: Abnormal   Collection Time: 11/11/17  9:49 PM  Result Value Ref Range   Glucose-Capillary 212 (H) 65 - 99 mg/dL  Glucose, capillary     Status: Abnormal   Collection Time: 11/12/17  6:14 AM  Result Value Ref Range   Glucose-Capillary 130 (H) 65 - 99 mg/dL  Glucose, capillary     Status: Abnormal   Collection Time: 11/12/17 11:19 AM  Result Value Ref Range   Glucose-Capillary 175 (H) 65 - 99 mg/dL  Glucose, capillary     Status: Abnormal   Collection Time: 11/12/17  4:44 PM  Result Value Ref Range   Glucose-Capillary 220 (H) 65 - 99 mg/dL  Glucose, capillary     Status: Abnormal   Collection Time: 11/12/17  9:45 PM  Result Value Ref Range   Glucose-Capillary 169 (H) 65 - 99 mg/dL  Glucose, capillary     Status: Abnormal   Collection Time: 11/13/17  6:35 AM  Result Value Ref Range   Glucose-Capillary 116 (H) 65 - 99 mg/dL  Basic metabolic panel     Status: Abnormal   Collection Time: 11/13/17  6:58 AM  Result Value Ref Range   Sodium 141 135 - 145 mmol/L   Potassium 3.4 (L) 3.5 - 5.1 mmol/L   Chloride 99 (L) 101 - 111 mmol/L   CO2 32 22 - 32 mmol/L   Glucose, Bld 122 (H) 65 - 99  mg/dL   BUN 10 6 - 20 mg/dL   Creatinine, Ser 1.21 (H) 0.44 - 1.00 mg/dL   Calcium 8.8 (L) 8.9 - 10.3 mg/dL   GFR calc non Af Amer 40 (L) >60 mL/min   GFR calc Af Amer 47 (L) >60 mL/min    Comment: (NOTE) The eGFR has been calculated using the CKD EPI equation. This calculation has not been validated in all clinical situations. eGFR's persistently <60 mL/min signify possible Chronic Kidney Disease.    Anion gap 10 5 - 15    Comment: Performed at [facility] [address], [place], [place] [address]  Glucose, capillary     Status: Abnormal   Collection Time: 11/13/17 11:45 AM  Result Value Ref Range   Glucose-Capillary 171 (H) 65 - 99 mg/dL  Glucose, capillary     Status: Abnormal   Collection Time: 11/13/17  4:44 PM  Result Value Ref Range   Glucose-Capillary 180 (H) 65 - 99 mg/dL  Glucose, capillary     Status: Abnormal   Collection Time: 11/13/17  9:36 PM  Result Value Ref Range   Glucose-Capillary 171 (H) 65 - 99 mg/dL  Glucose,  capillary     Status: Abnormal   Collection Time: 11/14/17  6:37 AM  Result Value Ref Range   Glucose-Capillary 139 (H) 65 - 99 mg/dL     Constitutional: No distress . Vital signs reviewed. HEENT: EOMI, oral membranes moist Neck: supple Cardiovascular: RRR without murmur. No JVD    Respiratory: CTA Bilaterally without wheezes or rales. Normal effort    GI: BS +, non-tender, non-distended  Skin:   Other PICC site is clean, no erythema Neuro: Alert/Oriented, Abnormal Motor 4/5 in RUE and RLE , 5/5 on Left side and Abnormal FMC Ataxic/ dec FMC. Speech dysarthric Musc/Skel:  Other No pain with UE or LE ROM Gen NAD   Assessment/Plan: 1. Functional deficits secondary to RIght hemiataxia due Right cerebellar ICH which require 3+ hours per day of interdisciplinary therapy in a comprehensive inpatient rehab setting. Physiatrist is providing close team supervision and 24 hour management of active medical problems listed below. Physiatrist and  rehab team continue to assess barriers to discharge/monitor patient progress toward functional and medical goals. FIM: Function - Bathing Position: Shower Body parts bathed by patient: Right arm, Left arm, Chest, Abdomen, Front perineal area, Right upper leg, Left upper leg, Right lower leg, Left lower leg, Buttocks Body parts bathed by helper: Back Assist Level: Supervision or verbal cues  Function- Upper Body Dressing/Undressing What is the patient wearing?: Pull over shirt/dress Bra - Perfomed by patient: Thread/unthread right bra strap, Thread/unthread left bra strap Bra - Perfomed by helper: Hook/unhook bra (pull down sports bra) Pull over shirt/dress - Perfomed by patient: Thread/unthread right sleeve, Thread/unthread left sleeve, Put head through opening, Pull shirt over trunk Pull over shirt/dress - Perfomed by helper: Pull shirt over trunk Assist Level: Set up Set up : To obtain clothing/put away Function - Lower Body Dressing/Undressing What is the patient wearing?: Pants, Underwear Position: Wheelchair/chair at sink Underwear - Performed by patient: Thread/unthread right underwear leg, Thread/unthread left underwear leg, Pull underwear up/down Underwear - Performed by helper: Thread/unthread left underwear leg, Pull underwear up/down Pants- Performed by patient: Thread/unthread right pants leg, Thread/unthread left pants leg, Pull pants up/down Pants- Performed by helper: Pull pants up/down Non-skid slipper socks- Performed by patient: Don/doff right sock, Don/doff left sock Non-skid slipper socks- Performed by helper: Don/doff right sock, Don/doff left sock Assist for footwear: Supervision/touching assist Assist for lower body dressing: Touching or steadying assistance (Pt > 75%)  Function - Toileting Toileting activity did not occur: No continent bowel/bladder event Toileting steps completed by patient: Adjust clothing prior to toileting, Performs perineal hygiene, Adjust  clothing after toileting Toileting steps completed by helper: Adjust clothing prior to toileting, Performs perineal hygiene, Adjust clothing after toileting Toileting Assistive Devices: Grab bar or rail Assist level: Touching or steadying assistance (Pt.75%)  Function - Air cabin crew transfer assistive device: Grab bar, Walker Assist level to toilet: Supervision or verbal cues Assist level from toilet: Supervision or verbal cues  Function - Chair/bed transfer Chair/bed transfer method: Stand pivot Chair/bed transfer assist level: Touching or steadying assistance (Pt > 75%) Chair/bed transfer assistive device: Armrests  Function - Locomotion: Wheelchair Type: Manual Max wheelchair distance: 40 ft  Assist Level: Supervision or verbal cues, Maximal assistance (Pt 25 - 49%) Assist Level: Dependent (Pt equals 0%) Assist Level: Dependent (Pt equals 0%) Turns around,maneuvers to table,bed, and toilet,negotiates 3% grade,maneuvers on rugs and over doorsills: No Function - Locomotion: Ambulation Assistive device: Walker-rolling Max distance: 10 ft  Assist level: Touching or steadying assistance (Pt >  75%) Walk 10 feet activity did not occur: Safety/medical concerns Assist level: Touching or steadying assistance (Pt > 75%) Walk 50 feet with 2 turns activity did not occur: Safety/medical concerns Assist level: Touching or steadying assistance (Pt > 75%) Walk 150 feet activity did not occur: Safety/medical concerns Walk 10 feet on uneven surfaces activity did not occur: Safety/medical concerns  Function - Comprehension Comprehension: Auditory Comprehension assist level: Understands basic 50 - 74% of the time/ requires cueing 25 - 49% of the time  Function - Expression Expression: Verbal Expression assist level: Expresses basic 50 - 74% of the time/requires cueing 25 - 49% of the time. Needs to repeat parts of sentences.  Function - Social Interaction Social Interaction assist  level: Interacts appropriately 50 - 74% of the time - May be physically or verbally inappropriate.  Function - Problem Solving Problem solving assist level: Solves basic 25 - 49% of the time - needs direction more than half the time to initiate, plan or complete simple activities, Solves basic 50 - 74% of the time/requires cueing 25 - 49% of the time  Function - Memory Memory assist level: Recognizes or recalls 50 - 74% of the time/requires cueing 25 - 49% of the time Patient normally able to recall (first 3 days only): Current season, Location of own room, That he or she is in a hospital, Staff names and faces  Medical Problem List and Plan: 1.Altered mental status with lethargysecondary to right cerebellar parenchymal hemorrhage felt to be secondary to hypertensive crisis Continue aspirin 81 mg/day until June 7 and then may resume clopidogrel per Neuro PT, OT, SLP  2. DVT Prophylaxis/Anticoagulation: Subcutaneous Lovenox 30 mg daily initiated 11/01/2017 3. Pain Management:Tylenol as needed 4. Mood:Provided pt positive emotional support.  5. Neuropsych: This patientiscapable of making decisions on herown behalf. 6. Skin/Wound Care:Routine skin checks 7. 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 Not a candidate for CABG. Patient cleared to begin aspirin 81 mg daily 11/05/2017.Plan for PCI as an outpatient follow-up 9.Decompensated systolic congestive heart failure. Monitor for any signs of fluid overload. Lasix 40 mg daily  -need to record weights Filed Weights   11/04/17 1832 11/04/17 2201 11/10/17 1406  Weight: 60.8 kg (134 lb 0.6 oz) 60.8 kg (134 lb 0.6 oz) 60.4 kg (133 lb 2.5 oz)    10.Hypertension. Coreg 12.5 mg twice daily, Altace 10 mg twice daily, Aldactone 25 mg daily, Nitro-Dur patch 0.2 mg daily Vitals:   11/13/17 2315 11/14/17 0524  BP: (!) 171/78 (!) 158/63  Pulse:  (!) 57   Resp:  18  Temp:    SpO2:  95%  elevated  5/26 will adjust meds, increased NTG patch to .17m on 5/25 11.Diabetes mellitus with peripheral neuropathy. Levemir 6 units nightly. Checking blood sugars before meals and at bedtime CBG (last 3)  Recent Labs    11/13/17 1644 11/13/17 2136 11/14/17 0637  GLUCAP 180* 171* 139*  Controlled 5/26 metformin 12.History of breast, colon, endometrial and cervical cancer. Continue hydroxyurea 13 Mild HypoK due to lasix will increase KCL  Supplement to 443m, 14.  Leukocytosis- Polycythemia Vera Dr CoMike Gips hematologist 15.  HLD- had muscle pain with simvastatin, none with  pravastatin cont 1068mer day LOS (Days) 10 A FACE TO FACE EVALUATION WAS PERFORMED  AndCharlett Blake26/2019, 9:36 AM

## 2017-11-14 NOTE — Progress Notes (Signed)
Occupational Therapy Session Note  Patient Details  Name: [name] MRN: [id number] Date of Birth: [birth year]  Today's Date: 11/14/2017 OT Individual Time: 2585-2778 OT Individual Time Calculation (min): 69 min    Short Term Goals: Week 2:  OT Short Term Goal 1 (Week 2): STG=LTG due to LOS  Skilled Therapeutic Interventions/Progress Updates:    Pt seen for OT session focusing on ADL re-training, functional activity tolerance, and functional transfers. Pt in supine upon arrival, agreeable to tx session and denying pain. 'HSe transferred to EOB mod I using hospital bed functions. She ambulated throughout room with RW and close supervision, VCs for RW management in functional context as well as hand placement on RW duirng sit>stand.  Completed toileting task with guarding assist, encouragement for independence to complete buttock hygiene. Following seated rest break, she stood at sink to complete oral care and wash hands, tolerating ~2 minutes of standing prior to needing seated rest break.  Pt taken to ADL apartment total A in w/c for time and energy conservation. Education and demonstration provided for walk in shower transfer. Pt return demonstrated with CGA to enter, requiring mod A for stand from shower chair with UE support.  In [employer], addressed LE strengthening in prep for functional transfers. Pt completed x3 sets of 5 reps of sit>stand without UE support. Therapy mat lowered on subsequent trials.  Pt returned to room at end of session, despite encouragement to sit OOB, pt requested return to supine. Pt left in bed to rest with all needs in reach and bed alarm on.  Discussed d/c planning, pt aware of plans for hired help at home. Cont to educate need for 24 hr assist due to cognitive deficits and supervision with mobility using RW.    Therapy Documentation Precautions:  Precautions Precautions: Fall Precaution Comments:h/o CA and several falls Restrictions Weight Bearing  Restrictions: No Pain:   No/denies pain  See Function Navigator for Current Functional Status.   Therapy/Group: Individual Therapy  Kalep Full L 11/14/2017, 7:01 AM

## 2017-11-15 ENCOUNTER — Inpatient Hospital Stay (HOSPITAL_COMMUNITY): Payer: Medicare Other | Admitting: Occupational Therapy

## 2017-11-15 ENCOUNTER — Inpatient Hospital Stay (HOSPITAL_COMMUNITY): Payer: Medicare Other

## 2017-11-15 ENCOUNTER — Inpatient Hospital Stay (HOSPITAL_COMMUNITY): Payer: Medicare Other | Admitting: Physical Therapy

## 2017-11-15 LAB — GLUCOSE, CAPILLARY
Glucose-Capillary: 143 mg/dL — ABNORMAL HIGH (ref 65–99)
Glucose-Capillary: 134 mg/dL — ABNORMAL HIGH (ref 65–99)
Glucose-Capillary: 138 mg/dL — ABNORMAL HIGH (ref 65–99)
Glucose-Capillary: 162 mg/dL — ABNORMAL HIGH (ref 65–99)

## 2017-11-15 MED ORDER — AMLODIPINE BESYLATE 2.5 MG PO TABS
2.5000 mg | ORAL_TABLET | Freq: Every day | ORAL | Status: DC
  Administered 2017-11-15 – 2017-11-17 (×3): 2.5 mg via ORAL
  Filled 2017-11-15 (×3): qty 1

## 2017-11-15 MED ORDER — PRAVASTATIN SODIUM 20 MG PO TABS
20.0000 mg | ORAL_TABLET | Freq: Every day | ORAL | Status: DC
  Administered 2017-11-15 – 2017-11-16 (×2): 20 mg via ORAL
  Filled 2017-11-15 (×2): qty 1

## 2017-11-15 MED ORDER — NITROGLYCERIN 0.2 MG/HR TD PT24
0.2000 mg | MEDICATED_PATCH | Freq: Every day | TRANSDERMAL | Status: DC
  Administered 2017-11-16 – 2017-11-17 (×2): 0.2 mg via TRANSDERMAL
  Filled 2017-11-15 (×2): qty 1

## 2017-11-15 NOTE — Progress Notes (Signed)
Speech Language Pathology Daily Session Note  Patient Details  Name: SARYIAH BENCOSME MRN: [id number] Date of Birth: 16-Sep-1931  Today's Date: 11/15/2017 SLP Individual Time: 1100-1200 SLP Individual Time Calculation (min): 60 min  Short Term Goals: Week 2: SLP Short Term Goal 1 (Week 2): Pt will overarticulate and increase vocal intensity to achieve intelligibility at the sentence level with min cues for use of speech intelligibility strategies.   SLP Short Term Goal 2 (Week 2): Pt will demonstrate sustained attention during functional task for 30 minutes with Min A verbal cues. SLP Short Term Goal 3 (Week 2): Pt will recall basic,daily information with Mod A verbal cues for use of external aid.  SLP Short Term Goal 4 (Week 2): Pt will demonstrate funtional problem solving for basic and familar (mildly complex) tasks with Min A verbal cues. SLP Short Term Goal 5 (Week 2): Pt will self-monitor functional errors with Min A verbal cues.   Skilled Therapeutic Interventions:Skilled ST services focused on cognitive skills. SLP facilitated basic problem solving utilizing sequence cards, pt required extra time for sequence of three step cards and min-mod A verbal cues of problem solving and self monitoring errors in four step cards. Pt required min-mod A verbal cues in problem solving to identify three difference among two cards and mod A verbal cues to recall difference found due to working memory impairment. Pt required min-mod A verbal cues to utilize speech intelligibility strategies at sentence level. SLP and pt discussed discharge plan and continuing skilled ST services, pt agreed. Pt was left in room with call bell within reach. Recommend to continue skilled ST services.      Function:  Eating Eating                 Cognition Comprehension Comprehension assist level: Understands basic 50 - 74% of the time/ requires cueing 25 - 49% of the time;Understands basic 75 - 89% of the time/  requires cueing 10 - 24% of the time  Expression   Expression assist level: Expresses basic 50 - 74% of the time/requires cueing 25 - 49% of the time. Needs to repeat parts of sentences.;Expresses basic 75 - 89% of the time/requires cueing 10 - 24% of the time. Needs helper to occlude trach/needs to repeat words.  Social Interaction Social Interaction assist level: Interacts appropriately 75 - 89% of the time - Needs redirection for appropriate language or to initiate interaction.;Interacts appropriately 90% of the time - Needs monitoring or encouragement for participation or interaction.  Problem Solving Problem solving assist level: Solves basic 50 - 74% of the time/requires cueing 25 - 49% of the time;Solves basic 75 - 89% of the time/requires cueing 10 - 24% of the time  Memory Memory assist level: Recognizes or recalls 50 - 74% of the time/requires cueing 25 - 49% of the time;Recognizes or recalls 75 - 89% of the time/requires cueing 10 - 24% of the time    Pain Pain Assessment Pain Score: 0-No pain  Therapy/Group: Individual Therapy  Arney Mayabb  [facility] 11/15/2017, 1:27 PM

## 2017-11-15 NOTE — Progress Notes (Signed)
Physical Therapy Session Note  Patient Details  Name: [name] MRN: [id number] Date of Birth: 22-Sep-1931  Today's Date: 11/15/2017 PT Individual Time: 2330-0762 PT Individual Time Calculation (min): 70 min   Short Term Goals: Week 2:  PT Short Term Goal 1 (Week 2): STG = LTG due to anticipated d/c date.  Skilled Therapeutic Interventions/Progress Updates:  Pt received in room with NT assisting her from the bathroom. Pt's niece present & educated on downgrade to min assist level goals with her reporting they plan to hire live in assistance upon d/c; niece then exited session. Pt reports feeling "tuckered out" and reports she plans to lay in the bed most of the time when she returns home - therapist educated her on min assist level goals and encouraged her to stay active as much as possible. Pt transferred to EOB with hospital bed features & supervision and completes stand pivot bed<>w/c with steady assist with cuing for hand placement for sit>stand portion of transfer & this was ongoing throughout session. Pt negotiates 8 steps with BUE supported on L rail and min assist. Pt elects to descend with RLE first but this causes her to turn at awkward angle, however pt with poor quad strength when descending with LLE first and exhibits slight R knee buckling. Pt completed bed mobility (supine<>sit, rolling L<>R) in apartment with supervision and extra time. Pt reports she has a Futures trader and her niece [name]) also drives a SUV therefore simulated car transfer practiced at SUV height with pt completing ambulatory transfer with RW & min assist with cuing to push up on seat instead of pulling on door to stand up. Pt ambulates 100 ft with RW & min assist with task focusing on endurance training. Pt propelled w/c with BUE with cuing for technique as pt with decreased ability to reach back with RUE and min assist to prevent steering to R. At end of session pt left in bed with alarm set & all needs within  reach; educated pt on need to limit pillows under knees when resting in bed.   During gait during session pt with 1 episode of R knee buckling but pt with decreased awareness. Throughout session pt demonstrates poor safety awareness, requiring cuing for hand placement for sit<>stand transfers.  During car transfer: SpO2 = 98-99% on room air, HR = 55 bpm.  Therapy Documentation Precautions:  Precautions Precautions: Fall Precaution Comments: 2L O2 via ; h/o CA and several falls Restrictions Weight Bearing Restrictions: No   See Function Navigator for Current Functional Status.   Therapy/Group: Individual Therapy  [name] 11/15/2017, 2:18 PM

## 2017-11-15 NOTE — Progress Notes (Signed)
Social Work Patient ID: [name], female   DOB: Jun 14, 1932, 84 y.o.   MRN: [id number]  Met with pt to discuss plan for Wed and that her potential caregiver will be here tomorroww at 12:00 to talk with her. Both aware pt does need some hands on assist due to balance and safety. Pt seems to be less participatory in therapies, she report she is ready to go home. Will continue to work on discharge plans.

## 2017-11-15 NOTE — Progress Notes (Signed)
Subjective/Complaints:  No issues overnite, god appetite Oriented to person and hospital but not [facility]  ROS: Patient denies N/V/D, SOB, CP jt pain   Objective: Vital Signs: Blood pressure (!) 166/73, pulse 63, temperature (!) 97.5 F (36.4 C), temperature source Oral, resp. rate 18, height 5' 1"  (1.549 m), weight 60.4 kg (133 lb 2.5 oz), SpO2 94 %. No results found. Results for orders placed or performed during the hospital encounter of 11/04/17 (from the past 72 hour(s))  Glucose, capillary     Status: Abnormal   Collection Time: 11/12/17 11:19 AM  Result Value Ref Range   Glucose-Capillary 175 (H) 65 - 99 mg/dL  Glucose, capillary     Status: Abnormal   Collection Time: 11/12/17  4:44 PM  Result Value Ref Range   Glucose-Capillary 220 (H) 65 - 99 mg/dL  Glucose, capillary     Status: Abnormal   Collection Time: 11/12/17  9:45 PM  Result Value Ref Range   Glucose-Capillary 169 (H) 65 - 99 mg/dL  Glucose, capillary     Status: Abnormal   Collection Time: 11/13/17  6:35 AM  Result Value Ref Range   Glucose-Capillary 116 (H) 65 - 99 mg/dL  Basic metabolic panel     Status: Abnormal   Collection Time: 11/13/17  6:58 AM  Result Value Ref Range   Sodium 141 135 - 145 mmol/L   Potassium 3.4 (L) 3.5 - 5.1 mmol/L   Chloride 99 (L) 101 - 111 mmol/L   CO2 32 22 - 32 mmol/L   Glucose, Bld 122 (H) 65 - 99 mg/dL   BUN 10 6 - 20 mg/dL   Creatinine, Ser 1.21 (H) 0.44 - 1.00 mg/dL   Calcium 8.8 (L) 8.9 - 10.3 mg/dL   GFR calc non Af Amer 40 (L) >60 mL/min   GFR calc Af Amer 47 (L) >60 mL/min    Comment: (NOTE) The eGFR has been calculated using the CKD EPI equation. This calculation has not been validated in all clinical situations. eGFR's persistently <60 mL/min signify possible Chronic Kidney Disease.    Anion gap 10 5 - 15    Comment: Performed at [facility] [address] [place], [place] [address]  Glucose, capillary     Status: Abnormal   Collection Time: 11/13/17  11:45 AM  Result Value Ref Range   Glucose-Capillary 171 (H) 65 - 99 mg/dL  Glucose, capillary     Status: Abnormal   Collection Time: 11/13/17  4:44 PM  Result Value Ref Range   Glucose-Capillary 180 (H) 65 - 99 mg/dL  Glucose, capillary     Status: Abnormal   Collection Time: 11/13/17  9:36 PM  Result Value Ref Range   Glucose-Capillary 171 (H) 65 - 99 mg/dL  Glucose, capillary     Status: Abnormal   Collection Time: 11/14/17  6:37 AM  Result Value Ref Range   Glucose-Capillary 139 (H) 65 - 99 mg/dL  Glucose, capillary     Status: Abnormal   Collection Time: 11/14/17 11:38 AM  Result Value Ref Range   Glucose-Capillary 142 (H) 65 - 99 mg/dL  Glucose, capillary     Status: Abnormal   Collection Time: 11/14/17  4:44 PM  Result Value Ref Range   Glucose-Capillary 159 (H) 65 - 99 mg/dL  Glucose, capillary     Status: Abnormal   Collection Time: 11/14/17  9:04 PM  Result Value Ref Range   Glucose-Capillary 158 (H) 65 - 99 mg/dL  Glucose, capillary  Status: Abnormal   Collection Time: 11/15/17  6:33 AM  Result Value Ref Range   Glucose-Capillary 143 (H) 65 - 99 mg/dL     Constitutional: No distress . Vital signs reviewed. HEENT: EOMI, oral membranes moist Neck: supple Cardiovascular: RRR without murmur. No JVD    Respiratory: CTA Bilaterally without wheezes or rales. Normal effort    GI: BS +, non-tender, non-distended  Skin:   Other PICC site is clean, no erythema Neuro: Alert/Oriented, Abnormal Motor 4/5 in RUE and RLE , 5/5 on Left side and Abnormal FMC Ataxic/ dec FMC. Speech dysarthric Musc/Skel:  Other No pain with UE or LE ROM Gen NAD   Assessment/Plan: 1. Functional deficits secondary to RIght hemiataxia due Right cerebellar ICH which require 3+ hours per day of interdisciplinary therapy in a comprehensive inpatient rehab setting. Physiatrist is providing close team supervision and 24 hour management of active medical problems listed below. Physiatrist and rehab  team continue to assess barriers to discharge/monitor patient progress toward functional and medical goals. FIM: Function - Bathing Position: Shower Body parts bathed by patient: Right arm, Left arm, Chest, Abdomen, Front perineal area, Right upper leg, Left upper leg, Right lower leg, Left lower leg, Buttocks Body parts bathed by helper: Back Assist Level: Supervision or verbal cues  Function- Upper Body Dressing/Undressing What is the patient wearing?: Pull over shirt/dress Bra - Perfomed by patient: Thread/unthread right bra strap, Thread/unthread left bra strap Bra - Perfomed by helper: Hook/unhook bra (pull down sports bra) Pull over shirt/dress - Perfomed by patient: Thread/unthread right sleeve, Thread/unthread left sleeve, Put head through opening, Pull shirt over trunk Pull over shirt/dress - Perfomed by helper: Pull shirt over trunk Assist Level: Set up Set up : To obtain clothing/put away Function - Lower Body Dressing/Undressing What is the patient wearing?: Pants, Underwear Position: Wheelchair/chair at sink Underwear - Performed by patient: Thread/unthread right underwear leg, Thread/unthread left underwear leg, Pull underwear up/down Underwear - Performed by helper: Thread/unthread left underwear leg, Pull underwear up/down Pants- Performed by patient: Thread/unthread right pants leg, Thread/unthread left pants leg, Pull pants up/down Pants- Performed by helper: Pull pants up/down Non-skid slipper socks- Performed by patient: Don/doff right sock, Don/doff left sock Non-skid slipper socks- Performed by helper: Don/doff right sock, Don/doff left sock Assist for footwear: Supervision/touching assist Assist for lower body dressing: Touching or steadying assistance (Pt > 75%)  Function - Toileting Toileting activity did not occur: No continent bowel/bladder event Toileting steps completed by patient: Adjust clothing prior to toileting, Performs perineal hygiene, Adjust  clothing after toileting Toileting steps completed by helper: Adjust clothing prior to toileting, Performs perineal hygiene, Adjust clothing after toileting Toileting Assistive Devices: Grab bar or rail Assist level: Touching or steadying assistance (Pt.75%)  Function - Air cabin crew transfer assistive device: Grab bar, Walker Assist level to toilet: Touching or steadying assistance (Pt > 75%) Assist level from toilet: Touching or steadying assistance (Pt > 75%)  Function - Chair/bed transfer Chair/bed transfer method: Stand pivot Chair/bed transfer assist level: Touching or steadying assistance (Pt > 75%) Chair/bed transfer assistive device: Armrests  Function - Locomotion: Wheelchair Type: Manual Max wheelchair distance: 40 ft  Assist Level: Supervision or verbal cues, Maximal assistance (Pt 25 - 49%) Assist Level: Dependent (Pt equals 0%) Assist Level: Dependent (Pt equals 0%) Turns around,maneuvers to table,bed, and toilet,negotiates 3% grade,maneuvers on rugs and over doorsills: No Function - Locomotion: Ambulation Assistive device: Walker-rolling Max distance: 40 ft Assist level: Touching or steadying assistance (Pt >  75%) Walk 10 feet activity did not occur: Safety/medical concerns Assist level: Touching or steadying assistance (Pt > 75%) Walk 50 feet with 2 turns activity did not occur: Safety/medical concerns Assist level: Touching or steadying assistance (Pt > 75%) Walk 150 feet activity did not occur: Safety/medical concerns Walk 10 feet on uneven surfaces activity did not occur: Safety/medical concerns  Function - Comprehension Comprehension: Auditory Comprehension assist level: Understands basic 50 - 74% of the time/ requires cueing 25 - 49% of the time  Function - Expression Expression: Verbal Expression assist level: Expresses basic 50 - 74% of the time/requires cueing 25 - 49% of the time. Needs to repeat parts of sentences.  Function - Social  Interaction Social Interaction assist level: Interacts appropriately 50 - 74% of the time - May be physically or verbally inappropriate.  Function - Problem Solving Problem solving assist level: Solves basic 25 - 49% of the time - needs direction more than half the time to initiate, plan or complete simple activities, Solves basic 50 - 74% of the time/requires cueing 25 - 49% of the time  Function - Memory Memory assist level: Recognizes or recalls 50 - 74% of the time/requires cueing 25 - 49% of the time Patient normally able to recall (first 3 days only): Current season, Location of own room, That he or she is in a hospital, Staff names and faces  Medical Problem List and Plan: 1.Altered mental status with lethargysecondary to right cerebellar parenchymal hemorrhage felt to be secondary to hypertensive crisis Continue aspirin 81 mg/day until June 7 and then may resume clopidogrel per Neuro PT, OT, SLP  2. DVT Prophylaxis/Anticoagulation: Subcutaneous Lovenox 30 mg daily initiated 11/01/2017 3. Pain Management:Tylenol as needed 4. Mood:Provided pt positive emotional support.  5. Neuropsych: This patientiscapable of making decisions on herown behalf. 6. Skin/Wound Care:Routine skin checks 7. 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 Not a candidate for CABG. Patient cleared to begin aspirin 81 mg daily 11/05/2017.Plan for PCI as an outpatient follow-up 9.Decompensated systolic congestive heart failure. Monitor for any signs of fluid overload. Lasix 40 mg daily  -need to record weights Filed Weights   11/04/17 1832 11/04/17 2201 11/10/17 1406  Weight: 60.8 kg (134 lb 0.6 oz) 60.8 kg (134 lb 0.6 oz) 60.4 kg (133 lb 2.5 oz)    10.Hypertension. Coreg 12.5 mg twice daily, Altace 10 mg twice daily, Aldactone 25 mg daily, Nitro-Dur patch 0.2 mg daily Vitals:   11/14/17 1942 11/15/17 0436  BP:  (!) 159/65 (!) 166/73  Pulse: (!) 55 63  Resp: 18 18  Temp: 97.6 F (36.4 C) (!) 97.5 F (36.4 C)  SpO2: 95% 94%  elevated  5/27 will adjust meds, increased NTG patch to .69m on 5/25, no response goal is <160 sys will add low dose amlodipine and change dose of NTG to home dose .231m11.Diabetes mellitus with peripheral neuropathy. Levemir 6 units nightly. Checking blood sugars before meals and at bedtime CBG (last 3)  Recent Labs    11/14/17 1644 11/14/17 2104 11/15/17 0633  GLUCAP 159* 158* 143*  Controlled 5/27 metformin 12.History of breast, colon, endometrial and cervical cancer. Continue hydroxyurea 13 Mild HypoK due to lasix will increase KCL  Supplement to 4026m 14.  Leukocytosis- Polycythemia Vera Dr CorMike Gip hematologist 15.  HLD- had muscle pain with simvastatin, none with  pravastatin cont 40m33mr day, no muscle pain increase to home dose of 20mg42m (Days) 11 A FACE TO FACE EVALUATION  WAS PERFORMED  Charlett Blake 11/15/2017, 8:09 AM

## 2017-11-15 NOTE — Progress Notes (Addendum)
Occupational Therapy Session Note  Patient Details  Name: [name] MRN: [id number] Date of Birth: 09/14/1931  Today's Date: 11/15/2017 OT Individual Time: 1607-3710 OT Individual Time Calculation (min): 53 min    Short Term Goals: Week 2:  OT Short Term Goal 1 (Week 2): STG=LTG due to LOS  Skilled Therapeutic Interventions/Progress Updates:    Pt seen for OT ADL bathing/dressing session. Pt in supine upon arrival with RN present administering meds. Pt agreeable to tx session and denying pain. She required encouragement throughout session for participation and independence as pt fatigued and asking for total A. She ambulated throughout room with close supervision using RW, requiring VCs for hand placement during sit<> Stand including reaching back when sitting. She bathed seated on tub bench, requiring VCs for task initiation and sequencing thorughout bathing/dressing activities. She required mod A to stand from tub bench in order to complete buttock hygiene. She returned to w/c to dress, standing at [facility] with guarding assist to pull pants up. Throughout functional tasks, pt required extended rest breaks and VCs for deep breathing techniques. She requested need for toileting task, walking with close supervision using RW, VCs to slow rate of ambulation speed. Following toileting task, pt completed grooming tasks standing at sink, tolerating ~3-4 minutes in standing before requiring seated rest break.   Pt requesting to return to bed throughout session, at end of session screaming at therapist to put her back to bed. Pt transitioned back to sitting EOB and return to supine, guided through deep breathing techniques. Pt required re-orientation of place and situation. RN made aware.   Therapy Documentation Precautions:  Precautions Precautions: Fall Precaution Comments:h/o CA and several falls Restrictions Weight Bearing Restrictions: No Pain:   No/denies pain  See Function Navigator for  Current Functional Status.   Therapy/Group: Individual Therapy  Criss Pallone L 11/15/2017, 7:16 AM

## 2017-11-16 ENCOUNTER — Inpatient Hospital Stay (HOSPITAL_COMMUNITY): Payer: Medicare Other | Admitting: Occupational Therapy

## 2017-11-16 ENCOUNTER — Inpatient Hospital Stay (HOSPITAL_COMMUNITY): Payer: Medicare Other | Admitting: Physical Therapy

## 2017-11-16 ENCOUNTER — Inpatient Hospital Stay (HOSPITAL_COMMUNITY): Payer: Medicare Other

## 2017-11-16 LAB — GLUCOSE, CAPILLARY
Glucose-Capillary: 164 mg/dL — ABNORMAL HIGH (ref 65–99)
Glucose-Capillary: 142 mg/dL — ABNORMAL HIGH (ref 65–99)
Glucose-Capillary: 166 mg/dL — ABNORMAL HIGH (ref 65–99)
Glucose-Capillary: 144 mg/dL — ABNORMAL HIGH (ref 65–99)

## 2017-11-16 MED ORDER — FUROSEMIDE 40 MG PO TABS: 40.0000 mg | ORAL_TABLET | Freq: Every day | ORAL | 0 refills | Status: DC

## 2017-11-16 MED ORDER — AMLODIPINE BESYLATE 2.5 MG PO TABS: 2.5000 mg | ORAL_TABLET | Freq: Every day | ORAL | 0 refills | Status: DC

## 2017-11-16 MED ORDER — RAMIPRIL 10 MG PO CAPS: 10.0000 mg | ORAL_CAPSULE | Freq: Two times a day (BID) | ORAL | 0 refills | Status: DC

## 2017-11-16 MED ORDER — ASPIRIN 81 MG PO CHEW: 81.0000 mg | CHEWABLE_TABLET | Freq: Every day | ORAL | Status: DC

## 2017-11-16 MED ORDER — SPIRONOLACTONE 25 MG PO TABS: 25.0000 mg | ORAL_TABLET | Freq: Every day | ORAL | 0 refills | Status: DC

## 2017-11-16 MED ORDER — BLOOD GLUCOSE METER KIT: PACK | 0 refills | Status: DC

## 2017-11-16 MED ORDER — CARVEDILOL 25 MG PO TABS: 25.0000 mg | ORAL_TABLET | Freq: Two times a day (BID) | ORAL | 0 refills | Status: AC

## 2017-11-16 MED ORDER — HYDROXYUREA 500 MG PO CAPS: ORAL_CAPSULE | ORAL | 0 refills | Status: DC

## 2017-11-16 MED ORDER — PRAVASTATIN SODIUM 20 MG PO TABS: 20.0000 mg | ORAL_TABLET | Freq: Every day | ORAL | 0 refills | Status: AC

## 2017-11-16 MED ORDER — NITROGLYCERIN 0.2 MG/HR TD PT24: 0.2000 mg | MEDICATED_PATCH | Freq: Every day | TRANSDERMAL | 12 refills | Status: DC

## 2017-11-16 MED ORDER — POTASSIUM CHLORIDE CRYS ER 20 MEQ PO TBCR: 40.0000 meq | EXTENDED_RELEASE_TABLET | Freq: Every day | ORAL | 0 refills | Status: DC

## 2017-11-16 MED ORDER — METHYLPHENIDATE HCL 5 MG PO TABS: 5.0000 mg | ORAL_TABLET | Freq: Two times a day (BID) | ORAL | 0 refills | Status: DC

## 2017-11-16 MED ORDER — PANTOPRAZOLE SODIUM 40 MG PO TBEC: 40.0000 mg | DELAYED_RELEASE_TABLET | Freq: Every day | ORAL | 0 refills | Status: AC

## 2017-11-16 NOTE — Discharge Instructions (Signed)
Inpatient Rehab Discharge Instructions  [name] Discharge date and time: No discharge date for patient encounter.   Activities/Precautions/ Functional Status: Activity: activity as tolerated Diet: soft Wound Care: none needed Functional status:  ___ No restrictions     ___ Walk up steps independently ___ 24/7 supervision/assistance   ___ Walk up steps with assistance ___ Intermittent supervision/assistance  ___ Bathe/dress independently ___ Walk with walker     _x__ Bathe/dress with assistance ___ Walk Independently    ___ Shower independently ___ Walk with assistance    ___ Shower with assistance ___ No alcohol     ___ Return to work/school ________  Special Instructions: Continue aspirin 81 mg daily and plan to begin Plavix 75 mg daily November 26, 2017.  Follow-up with cardiology services Dr. Terrence Dupont in regards to PCI stenting    [place]:    Home Health:   PT, OT, SP  Agency: [facility]   Date of last service:11/17/2017  Medical Equipment/Items Ordered:TRANSPORT [place]   [phone]  Other:PRIVATELY HIRED LIVE-IN ASSIST-[facility]-[phone]  GENERAL COMMUNITY RESOURCES FOR PATIENT/FAMILY: Support Groups:[facility] SUPPORT GROUP  EVERY SECOND Thursday (SEPT-MAY) @ 3:00-4:00 PM ON THE [place] [phone]  My questions have been answered and I understand these instructions. I will adhere to these goals and the provided educational materials after my discharge from the hospital.  Patient/Caregiver Signature _______________________________ Date __________  Clinician Signature _______________________________________ Date __________  Please bring this form and your medication list with you to all your follow-up doctor's appointments.

## 2017-11-16 NOTE — Progress Notes (Signed)
Physical Therapy Discharge Summary  Patient Details  Name: [name] MRN: [id number] Date of Birth: 15-Feb-1932  Today's Date: 11/16/2017 Treatment time: 0258-5277 Total treatment minutes: 58 minutes  Patient has met 6 of 11 long term goals due to increased strength and ability to compensate for deficits.  Pt has demonstrated very poor endurance and activity tolerance throughout stay at [facility], requiring encouragement to participate in various tasks with focus being on functional mobility activities to increase pt's safety and independence. Patient to discharge at an ambulatory level [place] with RW for short, household distances. Recommending use of transport w/c for community distances.   Patient's niece was present for caregiver education on this date and demonstrates sufficient ability to assist pt with functional tasks.  Reasons goals not met: impaired cognition, memory, and weakness, fatigue  Recommendation:  Patient will benefit from ongoing skilled PT services in home health setting to continue to advance safe functional mobility, address ongoing impairments in decreased balance, impaired coordination, decreased strength & endurance, impaired safety awareness, memory and recall, progress gait with LRAD, and minimize fall risk.  Equipment: RW, transport w/c  Reasons for discharge: treatment goals met and discharge from hospital  Patient/family agrees with progress made and goals achieved: Yes  Skilled PT Treatment: Pt received in bed with niece [name]) present for caregiver education; she reports they have planned to hire live in care for pt. Pt denies c/o pain. Therapist educated pt & niece on pt's ability to complete bed mobility at supervision level of assistance in regular bed, educated her on use of gait belt and how to assist pt with stand pivot and sit<>stand transfers. Pt & niece return demonstrated correct use of transport w/c (managing w/c brakes & leg rests), hand  placement and use of RW with transfers and gait while performing car (mini van simulated height), bed<>w/c, and couch transfers. Therapist provided education & demonstration for stair negotiation, educating them on pt's R knee buckling when descending stairs with LLE first. Pt & niece return demonstrated stair negotiation with therapist providing education for caregiver positioning. Pt reported nausea during session with 1 episode of emesis & RN made aware at end of session, but pt willing to continue with session. Back in room therapist provided instruction to assist pt with changing pants from EOB level with niece providing assistance for sit<>stand, clothing management, and standing balance. Encouraged pt to stay active upon return home. Provided pt & niece with printed instructions regarding today's education. Pt & niece denied further questions at this time & report understanding of all education. At end of session pt left in bed with alarm set & niece present in room, call bell in reac.  PT Discharge Precautions/Restrictions Precautions Precautions: Fall Restrictions Weight Bearing Restrictions: No   Vision/Perception  Pt wears glasses at baseline.  Cognition Overall Cognitive Status: Impaired/Different from baseline Arousal/Alertness: Awake/alert Memory: Impaired Memory Impairment: Decreased short term memory;Decreased recall of new information Awareness: Impaired Awareness Impairment: Anticipatory impairment;Emergent impairment Safety/Judgment: Impaired  Sensation Gross Motor Movements are Fluid and Coordinated: Yes Fine Motor Movements are Fluid and Coordinated: No(impaired coordination RLE)  Motor  Motor Motor: Hemiplegia;Abnormal postural alignment and control Motor - Discharge Observations: R hemi (LE>UE), generalized weakness   Mobility Bed Mobility Bed Mobility: Rolling Right;Rolling Left;Supine to Sit;Sit to Supine Rolling Right: 5: Supervision Rolling Left: 5:  Supervision Supine to Sit: 5: Supervision Sit to Supine: 5: Supervision Transfers Transfers: Yes Sit to Stand: 4: Min assist;With armrests Sit to Stand Details: Manual  facilitation for weight shifting Sit to Stand Details (indicate cue type and reason): verbal cuing for hand placement Stand Pivot Transfers: 4: Min assist Stand Pivot Transfer Details: Verbal cues for sequencing;Verbal cues for technique Stand Pivot Transfer Details (indicate cue type and reason): with RW   Locomotion  Ambulation Ambulation: Yes Ambulation/Gait Assistance: 4: Min assist Ambulation Distance (Feet): 100 Feet max Assistive device: Rolling walker Gait Gait: Yes Gait Pattern: (decreased gait speed, decreased BLE step & stride length, decreased heel strike BLE) Stairs / Additional Locomotion Stairs: Yes Stairs Assistance: 4: Min assist Stairs Assistance Details: Verbal cues for sequencing;Verbal cues for technique Stairs Assistance Details (indicate cue type and reason): poor eccentric control and some R knee buckling when descending stairs Stair Management Technique: One rail Left Number of Stairs: 8 Height of Stairs: 6(inches) Product manager Mobility: Yes Wheelchair Assistance: 4: Advertising account executive Details: Verbal cues for technique;Visual cues/gestures for Astronomer: Both upper extremities Wheelchair Parts Management: Needs assistance Distance: 40 ft    Trunk/Postural Assessment  Cervical Assessment Cervical Assessment: Exceptions to WFL(Forward head) Thoracic Assessment Thoracic Assessment: Exceptions to WFL(Kyphotic) Lumbar Assessment Lumbar Assessment: Exceptions to WFL(Posterior pelvic tilt) Postural Control Postural Control: Deficits on evaluation(Delayed reactions)   Balance Balance Balance Assessed: Yes Standardized Balance Assessment Standardized Balance Assessment: Berg Balance Test Berg Balance Test Sit to Stand: Able to  stand  independently using hands Standing Unsupported: Able to stand 2 minutes with supervision Sitting with Back Unsupported but Feet Supported on Floor or Stool: Able to sit safely and securely 2 minutes Stand to Sit: Controls descent by using hands Transfers: Able to transfer safely, definite need of hands Standing Unsupported with Eyes Closed: Able to stand 10 seconds with supervision Standing Ubsupported with Feet Together: Able to place feet together independently and stand for 1 minute with supervision From Standing, Reach Forward with Outstretched Arm: Can reach forward >5 cm safely (2") From Standing Position, Pick up Object from Floor: Able to pick up shoe, needs supervision From Standing Position, Turn to Look Behind Over each Shoulder: Turn sideways only but maintains balance Turn 360 Degrees: Able to turn 360 degrees safely but slowly Standing Unsupported, Alternately Place Feet on Step/Stool: Able to complete >2 steps/needs minimal assist Standing Unsupported, One Foot in Front: Needs help to step but can hold 15 seconds Standing on One Leg: Tries to lift leg/unable to hold 3 seconds but remains standing independently Total Score: 34   Extremity Assessment  LLE WFL RLE - 3+/5, quad weakness & poor eccentric control in functional context (I.e. Descending stairs)  See Function Navigator for Current Functional Status.  [name] 11/16/2017, 4:45 PM

## 2017-11-16 NOTE — Discharge Summary (Signed)
Discharge summary job [id number]

## 2017-11-16 NOTE — Discharge Summary (Signed)
NAMESHAKISHA, ABEND MEDICAL RECORD QB:[id number] ACCOUNT [id number] DATE OF BIRTH:1932/06/17 FACILITY: [facility] LOCATION: [facility] PHYSICIAN:ANDREW Letta Pate, MD  DISCHARGE SUMMARY  DATE OF DISCHARGE:  11/17/2017  DISCHARGE DIAGNOSES: 1.  Right cerebellar parenchymal hemorrhage felt secondary to hypertensive crisis. 2.  Subcutaneous Lovenox for deep venous thrombosis prophylaxis initiated 11/01/2017.  Acute anterior septal wall myocardial infarction with multivessel calcific 3 vessel disease. 3.  Decompensated systolic congestive heart failure. 4.  Hypertension. 5.  Diabetes mellitus, peripheral neuropathy. 6.  Leukocytosis - polycythemia vera.  HOSPITAL COURSE:  This is an 84 year old right-handed female with history of adenosarcoma of the cervix, endometrial carcinoma, status post total abdominal hysterectomy, diabetes mellitus, polycythemia vera, TIA with recurrent falls maintained on Plavix  in the past.  Per chart review and niece patient lives alone.  Presented to [facility], 10/29/1998, altered mental status, hematemesis and lethargy as well as difficulty in breathing.  Oxygen saturation 70%, requiring nonrebreather mask.  WBC 26,000.  Troponin  1.02.  CT angiogram of the chest showed pericardial effusion, cardiomegaly consistent with pulmonary edema.  A CT of the head showed right cerebellar hemorrhage.  Noted blood pressure was 174/113.  Noted right cerebellar parenchymal hemorrhage measuring  31 x 16 x 19 mm and extension of hemorrhage into the fourth ventricle without hydrocephalus.  The patient initially placed on 3% saline.  The patient was ruled in for a non-Q-wave myocardial infarction, underwent cardiac catheterization showing 3 vessel  disease, severe LV dysfunction.  CVTS was consulted for possible need for CABG not felt to be a candidate.  She was transferred to [facility] for further evaluation.  Echocardiogram with ejection fraction 25% as well as akinesis of the anterior   myocardium.  Neurosurgery consulted, Dr. Sherley Bounds, for cerebellar hemorrhage and advised conservative care.  Followup CT MRI stable.  Subcutaneous Lovenox initiated for DVT prophylaxis.  Placed on IV Lasix diuresis per cardiology services.  Plan was  to consider PCI only once able to tolerate dual antiplatelet medications.  Currently maintained on only aspirin, which was initiated 11/05/2017.  The patient was admitted for comprehensive rehabilitation program.  PAST MEDICAL HISTORY:  See discharge diagnoses.  SOCIAL HISTORY:  Lives alone, 1 level home.  Reported to be independent prior to admission.  FUNCTIONAL STATUS:  Upon admission to rehab services was minimal assistance 80 feet with rolling walker, moderate assist sit to stand, min/mod assist with activities of daily living.  PHYSICAL EXAMINATION: VITAL SIGNS:  Blood pressure 132/83, pulse 74, temperature 98, respirations 18. GENERAL:  Alert female.  Mood flat, but appropriate.   HEENT:  EOMs intact. NECK:  Supple, nontender, no JVD. CARDIOVASCULAR:  Rate controlled. ABDOMEN:  Soft, nontender, good bowel sounds.   NEUROLOGIC:  Speech with severe ataxic dysarthria, but intelligible.    The patient was admitted for a comprehensive rehabilitation program.  [place].  Pertaining to the patient's right cerebellar parenchymal hemorrhage, conservative care per neurosurgery.  She had been cleared to begin aspirin therapy and then plan was to begin Plavix, 11/26/2016.  Subcutaneous Lovenox  for DVT prophylaxis 11/01/2017.  No bleeding episodes.  In regards to acute anterior septal wall myocardial infarction, not a candidate for CABG.  She would remain on aspirin therapy.  Plan for PCI as an outpatient with cardiology service, Dr. Terrence Dupont.   She has yet to begin her Plavix therapy.  She exhibited no other signs of fluid overload, remained on Lasix therapy as directed.    Blood sugars continued to be monitored.  She  was on  low dose Levemir presently and continued to monitor with no other agents at this time.  Blood pressure was controlled monitored.    Polycythemia vera, followed by Dr. Mike Gip of hematology.  During her hospital rehab course, Ritalin was initiated to help patient sustain better mood and focus to tasks.    The patient received weekly collaborative interdisciplinary team conferences to discuss estimated length of stay, family teaching, any barriers to her discharge.  Sessions focused on family education, transfer edge of bed with supervision, complete  stand, pivot, bed, wheelchair steady assist.  Negotiates 8 steps, bilateral upper extremity support, left hand rail minimal assist.  Completed bed mobility, supine to sit, rolling left to right supervision,  needing some extra time to complete tasks.   Ambulates 100 feet, rolling walker, minimal assist.  Gather belongings for activities of daily living, homemaking, again needing some extra time to complete tasks.  Full family teaching completed and plan discharge to home.  DISCHARGE MEDICATIONS:  Included Norvasc 2.5 mg p.o. daily, aspirin 81 mg p.o. daily, Coreg 25 mg p.o. b.i.d., Lasix 40 mg p.o. daily, Hydrea 500 mg p.o. as directed, Levemir 8 units at bedtime, Ritalin 5 mg p.o. b.i.d.,  Nitro-Dur patch 0.2 mg daily,  Protonix 40 mg p.o. daily, Pravachol 20 mg p.o. daily, Altace 10 mg p.o. b.i.d., Senokot 1 tablet p.o. b.i.d., Aldactone 25 mg p.o. daily, potassium chloride 40 mEq p.o. daily.    Her diet was mechanical soft.    The patient would follow up with Dr. Alysia Penna at the outpatient rehab service office as directed; Dr. Sherley Bounds, call for appointment as needed;  Dr. Terrence Dupont to discuss plan for possible stenting;  Dr. Antony Contras, neurology service; Dr. Harrel Lemon, medical management.  SPECIAL INSTRUCTIONS:  Continue aspirin therapy as directed.  Plan to begin Plavix 11/26/2016.  At that time discuss with cardiology services for  possible plan for PCI stenting.  AN/NUANCE D:11/16/2017 T:11/16/2017 JOB:[id number]

## 2017-11-16 NOTE — NC FL2 (Signed)
[place] LEVEL OF CARE SCREENING TOOL     IDENTIFICATION  Patient Name: [name] Birthdate: 1932/04/06 Sex: female Admission Date (Current Location): 11/04/2017  [facility] [place] Number:  Engineering geologist and Address:  [facility], [place] [address] [place], [place] [address]      Provider Number: [id number]  Attending Physician Name and Address:  Charlett Blake, MD  Relative Name and Phone Number:  Ree Edman [phone]    Current Level of Care: Other (Comment)(rehab) Recommended Level of Care: Other (Comment)(HOme with hired assistance) Prior Approval Number:    Date Approved/Denied:   PASRR Number: [id number]  Discharge Plan: Home    Current Diagnoses: Patient Active Problem List   Diagnosis Date Noted  . H/O cerebral parenchymal hemorrhage 11/04/2017  . Essential hypertension 11/04/2017  . Hyperlipidemia 11/04/2017  . Diabetes ([place]) 11/04/2017  . CAD (coronary artery disease) 11/04/2017  . 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 Hypokalemia 11/04/2017  . Right-sided nontraumatic intracerebral hemorrhage of cerebellum ([place])   . History of cervical cancer   . History of TIA (transient ischemic attack)   . Acute systolic congestive heart failure ([place])   . Reactive hypertension   . Hypernatremia   . Leukocytosis   . Acute blood loss anemia   . Elevated serum creatinine   . Acute respiratory failure with hypoxia ([place])   . IVH (intraventricular hemorrhage) ([place]) 10/29/2017  . Hypoxia 10/28/2017  . Pancreatic lesion 05/24/2017  . 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 Pulmonary nodule, left 06/04/2015  . 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 Cerebral thrombosis with cerebral infarction 04/03/2015  . Cancer of right colon ([place]) 03/28/2015  . CVA (cerebral infarction) 12/21/2014  . Polycythemia vera ([place]) 06/22/2006    Orientation RESPIRATION BLADDER Height &  Weight     Self, Situation, Place, Time  Normal Continent(urgency wears brief) Weight: 133 lb 2.5 oz (60.4 kg) Height:  5\' 1"  (154.9 cm)  BEHAVIORAL SYMPTOMS/MOOD NEUROLOGICAL BOWEL NUTRITION STATUS      Continent Diet(carb modified)  AMBULATORY STATUS COMMUNICATION OF NEEDS Skin   Limited Assist Verbally Normal                       Personal Care Assistance Level of Assistance  Bathing, Dressing Bathing Assistance: Limited assistance   Dressing Assistance: Limited assistance     Functional Limitations Info  Sight(wears glasses) Sight Info: Impaired        SPECIAL CARE FACTORS FREQUENCY  PT (By licensed PT), OT (By licensed OT), Speech therapy     PT Frequency: 3x week OT Frequency: 3x week     Speech Therapy Frequency: 3x week      Contractures Contractures Info: Not present    Additional Factors Info  Code Status, Insulin Sliding Scale Code Status Info: Partial Allergies Info: Simvastatin   Insulin Sliding Scale Info: 3/ day       Current Medications (11/16/2017):  This is the current hospital active medication list Current Facility-Administered Medications  Medication Dose Route Frequency Provider Last Rate Last Dose  . amLODipine (NORVASC) tablet 2.5 mg  2.5 mg Oral Daily Kirsteins, Luanna Salk, MD   2.5 mg at 11/16/17 0749  . aspirin chewable tablet 81 mg  81 mg Oral Daily Cathlyn Parsons, PA-C   81 mg at 11/16/17 0750  . bisacodyl (DULCOLAX) suppository 10 mg  10 mg Rectal Once Kirsteins,  Luanna Salk, MD      . carvedilol (COREG) tablet 25 mg  25 mg Oral BID WC Lisabeth Pick, MD   25 mg at 11/16/17 0749  . enoxaparin (LOVENOX) injection 30 mg  30 mg Subcutaneous Q24H AngiulliLavon Paganini, PA-C   30 mg at 11/16/17 0758  . feeding supplement (GLUCERNA SHAKE) (GLUCERNA SHAKE) liquid 237 mL  237 mL Oral TID BM AngiulliLavon Paganini, PA-C   237 mL at 11/15/17 2112  . furosemide (LASIX) tablet 40 mg  40 mg Oral Daily Cathlyn Parsons, PA-C   40 mg at 11/16/17  9476  . hydroxyurea (HYDREA) capsule 500 mg  500 mg Oral Once per day on Sun Mon Tue Wed Thu Fri Cathlyn Parsons, PA-C   500 mg at 11/16/17 5465  . insulin aspart (novoLOG) injection 0-9 Units  0-9 Units Subcutaneous TID WC Cathlyn Parsons, PA-C   2 Units at 11/16/17 0354  . insulin detemir (LEVEMIR) injection 8 Units  8 Units Subcutaneous QHS Meredith Staggers, MD   8 Units at 11/15/17 2218  . methylphenidate (RITALIN) tablet 5 mg  5 mg Oral BID WC Kirsteins, Luanna Salk, MD   5 mg at 11/16/17 0749  . nitroGLYCERIN (NITRODUR - Dosed in mg/24 hr) patch 0.2 mg  0.2 mg Transdermal Daily Kirsteins, Luanna Salk, MD   0.2 mg at 11/16/17 0747  . pantoprazole (PROTONIX) EC tablet 40 mg  40 mg Oral Daily Cathlyn Parsons, PA-C   40 mg at 11/16/17 0750  . potassium chloride SA (K-DUR,KLOR-CON) CR tablet 40 mEq  40 mEq Oral Daily Charlett Blake, MD   40 mEq at 11/16/17 0749  . pravastatin (PRAVACHOL) tablet 20 mg  20 mg Oral q1800 Charlett Blake, MD   20 mg at 11/15/17 1711  . ramipril (ALTACE) capsule 10 mg  10 mg Oral BID Cathlyn Parsons, PA-C   10 mg at 11/16/17 0749  . senna-docusate (Senokot-S) tablet 1 tablet  1 tablet Oral BID Cathlyn Parsons, PA-C   1 tablet at 11/16/17 0749  . spironolactone (ALDACTONE) tablet 25 mg  25 mg Oral Daily Cathlyn Parsons, PA-C   25 mg at 11/16/17 6568     Discharge Medications: Please see discharge summary for a list of discharge medications.  Relevant Imaging Results:  Relevant Lab Results:   Additional Information SSN: [id number]  Ithzel Fedorchak, [name], LCSW

## 2017-11-16 NOTE — Progress Notes (Signed)
Occupational Therapy Session Note  Patient Details  Name: [name] MRN: [id number] Date of Birth: 16-Jan-1932  Today's Date: 11/16/2017 OT Individual Time: 8110-3159 OT Individual Time Calculation (min): 55 min    Short Term Goals: Week 2:  OT Short Term Goal 1 (Week 2): STG=LTG due to LOS  Skilled Therapeutic Interventions/Progress Updates:    Pt seen for OT ADL bathing/dressing session. Pt asleep in supine upon arrival, easily awaken. Required encouragement and education for participation in ADL task. She ambulated and completed functional transfers throughout session with close supervision, VCs for hand placement during sit<> Stand and RW management in functional context. She gathered items from clothing drawer, cuing to get all needed items. She bathed seated on tub bench, cuing to attend to all areas of body. She stood with use of grab bars to complete pericare/buttock hygiene. She returned to w/c to dress, requiring cues to move onto each article of clothing. STood at [employer] to complete [employer] Probation officer with close sueprvision and VCs for problem solving fastening of button. Following extended seated rest break, pt stood at sink to complete grooming tasks, tolerating ~3 minutes in standing before requiring seated rest break.  She returned to w/c at end of session, left with QRB donned and all needs in reach. Pt voices feeling ready for d/c home tomorrow.   Therapy Documentation Precautions:  Precautions Precautions: Fall Precaution Comments: h/o CA and several falls Restrictions Weight Bearing Restrictions: No Pain:  No/denies pain  See Function Navigator for Current Functional Status.   Therapy/Group: Individual Therapy  Shalia Bartko L 11/16/2017, 6:55 AM

## 2017-11-16 NOTE — Progress Notes (Signed)
Social Work  Discharge Note  The overall goal for the admission was met for:   Discharge location: Yes-HOME WITH LIVE-IN AIDE-24 HR  Length of Stay: Yes-13 DAYS  Discharge activity level: Yes-SUPERVISION-MIN ASSIST LEVEL-INCONSISTENT  Home/community participation: Yes  Services provided included: MD, RD, PT, OT, SLP, RN, CM, Pharmacy and SW  Financial Services: Private Insurance: [facility]  Follow-up services arranged: Home Health: [facility]-PT,OT,SP, DME: [facility]-TRANSPORT CHAIR and Patient/Family has no preference for HH/DME agencies added Youth Conservation officer, nature  Comments (or additional information):SUE ELLEN HAS BEEN HERE AND ARRANGED LIVE-IN CARE VIA OPTIONS FOR [place]. BOTH AWARE PT REQUIRES 24 HR CARE.  Patient/Family verbalized understanding of follow-up arrangements: Yes  Individual responsible for coordination of the follow-up plan: SUE ELLEN-NIECE  Confirmed correct DME delivered: Elease Hashimoto 11/16/2017    Elease Hashimoto

## 2017-11-16 NOTE — Progress Notes (Signed)
Speech Language Pathology Discharge Summary  Patient Details  Name: [name] MRN: [id number] Date of Birth: Sep 10, 1931  Today's Date: 11/16/2017 SLP Individual Time: 1000-1100 SLP Individual Time Calculation (min): 60 min   Skilled Therapeutic Interventions:  Skilled ST services focused on cognitive skills and family education. SLP facilitated re-assessment of cognitive linguistic skills administering MOCA version 7.2 , pt scored 15 out 30 (n=>26) with overall moderate deficits in attention, problem solving, error awareness and recall. SLP reviewed 4 point increase from initial assessment and continued need for skilled ST services, pt agreed. SLP recommended memory notebook to aid in recall and be completed by caregivers due to pt's impairment in writting, family and pt agreed. Pt demonstrated restatement of speech intelligibility strategies. All questions were answered to satisfaction. Pt was left in room with call bell within reach. Reccomend to continue skilled ST services.     Patient has met 8 of 8 long term goals.  Patient to discharge at overall Min;Mod;Modified Independent level.  Reasons goals not met:     Clinical Impression/Discharge Summary:   Pt demsonstrated good progress meeting 8 out 8 goals, discharging at level Mod I for least restrictive diet dys 3/thin (per pt request) Min A emergent awareness of errors / sustained attention and Mod A for recall of novel information/ basic problem solving and speech intelligibility at sentence level. Pt scored 15 out 30 on MOCA version 7. 2 (n=>26) demonstrating improvement for evaluation score of 11 out 30, however continues to demonstrate impairments in basic problem solving, awareness/correcting errors, recall, attention and speech intelligibility which is overall impacted by fatigue. Pt would benefit from continued skilled ST services in order to maximize functional independence and reduce burden of care upon discharge requiring 24 hour  supervision.   Care Partner:  Caregiver Able to Provide Assistance: Yes  Type of Caregiver Assistance: Cognitive;Physical  Recommendation:  Home Health SLP;24 hour supervision/assistance  Rationale for SLP Follow Up: Maximize functional communication;Maximize cognitive function and independence   Equipment:     Reasons for discharge: Discharged from hospital     Patient/Family Agrees with Progress Made and Goals Achieved: Yes   Function:  Eating Eating                 Cognition Comprehension Comprehension assist level: Understands basic 75 - 89% of the time/ requires cueing 10 - 24% of the time  Expression   Expression assist level: Expresses basic 50 - 74% of the time/requires cueing 25 - 49% of the time. Needs to repeat parts of sentences.  Social Interaction Social Interaction assist level: Interacts appropriately 75 - 89% of the time - Needs redirection for appropriate language or to initiate interaction.  Problem Solving Problem solving assist level: Solves basic 50 - 74% of the time/requires cueing 25 - 49% of the time  Memory Memory assist level: Recognizes or recalls 50 - 74% of the time/requires cueing 25 - 49% of the time   [name]  [facility] 11/16/2017, 4:43 PM

## 2017-11-16 NOTE — Progress Notes (Signed)
Physical Therapy Session Note  Patient Details  Name: [name] MRN: [id number] Date of Birth: Apr 12, 1932  Today's Date: 11/16/2017 PT Individual Time: 1300-1330 PT Individual Time Calculation (min): 30 min   Short Term Goals: Week 2:  PT Short Term Goal 1 (Week 2): STG = LTG due to anticipated d/c date.  Skilled Therapeutic Interventions/Progress Updates:    Pt supine in bed upon PT arrival, agreeable to therapy tx and denies pain. Pt performed sit<>stand with supervision and stand pivot with min assist to w/c. Pt transported to the gym. Pt participated in berg balance test, scored 34/56 and dicussed results with pt. Pt ambulated x 50 ft with RW and min assist. Pt transported back to room and requested to get back in bed, stand pivot with min assist and left supine in care of family.   Therapy Documentation Precautions:  Precautions Precautions: Fall Precaution Comments: 2L O2 via [place]; h/o CA and several falls Restrictions Weight Bearing Restrictions: No  Balance: Standardized Balance Assessment Standardized Balance Assessment: Berg Balance Test Berg Balance Test Sit to Stand: Able to stand  independently using hands Standing Unsupported: Able to stand 2 minutes with supervision Sitting with Back Unsupported but Feet Supported on Floor or Stool: Able to sit safely and securely 2 minutes Stand to Sit: Controls descent by using hands Transfers: Able to transfer safely, definite need of hands Standing Unsupported with Eyes Closed: Able to stand 10 seconds with supervision Standing Ubsupported with Feet Together: Able to place feet together independently and stand for 1 minute with supervision From Standing, Reach Forward with Outstretched Arm: Can reach forward >5 cm safely (2") From Standing Position, Pick up Object from Floor: Able to pick up shoe, needs supervision From Standing Position, Turn to Look Behind Over each Shoulder: Turn sideways only but maintains balance Turn 360  Degrees: Able to turn 360 degrees safely but slowly Standing Unsupported, Alternately Place Feet on Step/Stool: Able to complete >2 steps/needs minimal assist Standing Unsupported, One Foot in Front: Needs help to step but can hold 15 seconds Standing on One Leg: Tries to lift leg/unable to hold 3 seconds but remains standing independently Total Score: 34   See Function Navigator for Current Functional Status.   Therapy/Group: Individual Therapy  Netta Corrigan, PT, DPT 11/16/2017, 1:20 PM

## 2017-11-16 NOTE — Progress Notes (Signed)
Occupational Therapy Discharge Summary  Patient Details  Name: [name] MRN: [id number] Date of Birth: January 14, 1932   Patient has met 43 of 11 long term goals due to improved activity tolerance, improved balance, postural control, functional use of  LEFT upper extremity, improved attention, improved awareness and improved coordination.  Patient to discharge at overall Supervision- min A level.  Patient lived alone PTA. Plan is for pt to have 24 hr live in assistant at d/c. Training/education time was not available with pt's care giver during rehab admission.  Pt is extremely deconditioned and requires significantly increased time and rest breaks to complete all ADLs. Pt refused training of all IADLs during rehab admission, stating she hired assistance for this PTA. CSW reported pt was independent with all ADLs/IADLs PTA. Suspect pt attempting to cover up cognitive deficits when refusing IADLs.  Pt requires encouragement for participation and independence with all ADLs and reports she plans to stay in bed or watch TV all day at d/c. Cont education provided regarding benefits and importance of OOB and participation with ADLs.  Pt requires close supervision-min A for all mobility, unable to recall proper hand placement on RW during sit<> stands, poor RW management in functional context and poor safety awareness/ awareness of deficits.  Recommendation:  Patient will benefit from ongoing skilled OT services in home health setting to continue to advance functional skills in the area of BADL and Reduce care partner burden.  Equipment: Pt reports she has shower chair  Reasons for discharge: treatment goals met and discharge from hospital  Patient/family agrees with progress made and goals achieved: Yes  OT Discharge Precautions/Restrictions  Precautions Precautions: Fall Restrictions Weight Bearing Restrictions: No Vision Baseline Vision/History: Wears glasses Wears Glasses: At all  times Patient Visual Report: No change from baseline Vision Assessment?: No apparent visual deficits Perception  Perception: Within Functional Limits Praxis Praxis: Impaired Praxis Impairment Details: Initiation Cognition Overall Cognitive Status: Impaired/Different from baseline Arousal/Alertness: Awake/alert Sensation Sensation Light Touch: Appears Intact Proprioception: Appears Intact Coordination Gross Motor Movements are Fluid and Coordinated: Yes Fine Motor Movements are Fluid and Coordinated: No Coordination and Movement Description: Decreased fine motor coordination [name] UE, however, able to use at functional level mod I Motor  Motor Motor: Hemiplegia;Abnormal postural alignment and control Motor - Discharge Observations: R hemi (LE>UE), generalized weakness Trunk/Postural Assessment  Cervical Assessment Cervical Assessment: Exceptions to WFL(Forward head) Thoracic Assessment Thoracic Assessment: Exceptions to WFL(Kyphotic) Lumbar Assessment Lumbar Assessment: Exceptions to WFL(Posterior pelvic tilt) Postural Control Postural Control: Deficits on evaluation(Delayed reactions)  Balance Balance Balance Assessed: Yes Standardized Balance Assessment Standardized Balance Assessment: Berg Balance Test Berg Balance Test Sit to Stand: Able to stand  independently using hands Standing Unsupported: Able to stand 2 minutes with supervision Sitting with Back Unsupported but Feet Supported on Floor or Stool: Able to sit safely and securely 2 minutes Stand to Sit: Controls descent by using hands Transfers: Able to transfer safely, definite need of hands Standing Unsupported with Eyes Closed: Able to stand 10 seconds with supervision Standing Ubsupported with Feet Together: Able to place feet together independently and stand for 1 minute with supervision From Standing, Reach Forward with Outstretched Arm: Can reach forward >5 cm safely (2") From Standing Position, Pick up Object  from Floor: Able to pick up shoe, needs supervision From Standing Position, Turn to Look Behind Over each Shoulder: Turn sideways only but maintains balance Turn 360 Degrees: Able to turn 360 degrees safely but slowly Standing Unsupported, Alternately Place Feet on  Step/Stool: Able to complete >2 steps/needs minimal assist Standing Unsupported, One Foot in Front: Needs help to step but can hold 15 seconds Standing on One Leg: Tries to lift leg/unable to hold 3 seconds but remains standing independently Total Score: 34 Static Sitting Balance Static Sitting - Level of Assistance: 6: Modified independent (Device/Increase time) Dynamic Sitting Balance Dynamic Sitting - Balance Support: During functional activity;Feet supported Dynamic Sitting - Level of Assistance: 5: Stand by assistance Static Standing Balance Static Standing - Level of Assistance: 5: Stand by assistance Dynamic Standing Balance Dynamic Standing - Balance Support: During functional activity;Right upper extremity supported;Left upper extremity supported Dynamic Standing - Level of Assistance: 5: Stand by assistance Dynamic Standing - Comments: Standing to complete LB dressing tasks Extremity/Trunk Assessment RUE Assessment RUE Assessment: Within Functional Limits LUE Assessment LUE Assessment: Exceptions to WFL(4/5 throughout)   See Function Navigator for Current Functional Status.  [name] 11/16/2017, 7:13 AM

## 2017-11-16 NOTE — Progress Notes (Addendum)
Social Work Patient ID: [name], female   DOB: [birth year], 84 y.o.   MRN: [id number]  Collie Siad Ellen-niece here to meet with the private duty agency liaison to discuss pt's needs at home. Planning for discharge tomorrow. Have added a youth rolling walker with transport chair.

## 2017-11-16 NOTE — Progress Notes (Signed)
Subjective/Complaints:  No issues overnite, pt states she has 24/7 help THough I was her therapist but is oriented to [facility] and CVA  ROS: Patient denies N/V/D, SOB, CP jt pain   Objective: Vital Signs: Blood pressure (!) 165/65, pulse 61, temperature (!) 97.4 F (36.3 C), temperature source Oral, resp. rate 18, height 5\' 1"  (1.549 m), weight 60.4 kg (133 lb 2.5 oz), SpO2 95 %. No results found. Results for orders placed or performed during the hospital encounter of 11/04/17 (from the past 72 hour(s))  Glucose, capillary     Status: Abnormal   Collection Time: 11/13/17 11:45 AM  Result Value Ref Range   Glucose-Capillary 171 (H) 65 - 99 mg/dL  Glucose, capillary     Status: Abnormal   Collection Time: 11/13/17  4:44 PM  Result Value Ref Range   Glucose-Capillary 180 (H) 65 - 99 mg/dL  Glucose, capillary     Status: Abnormal   Collection Time: 11/13/17  9:36 PM  Result Value Ref Range   Glucose-Capillary 171 (H) 65 - 99 mg/dL  Glucose, capillary     Status: Abnormal   Collection Time: 11/14/17  6:37 AM  Result Value Ref Range   Glucose-Capillary 139 (H) 65 - 99 mg/dL  Glucose, capillary     Status: Abnormal   Collection Time: 11/14/17 11:38 AM  Result Value Ref Range   Glucose-Capillary 142 (H) 65 - 99 mg/dL  Glucose, capillary     Status: Abnormal   Collection Time: 11/14/17  4:44 PM  Result Value Ref Range   Glucose-Capillary 159 (H) 65 - 99 mg/dL  Glucose, capillary     Status: Abnormal   Collection Time: 11/14/17  9:04 PM  Result Value Ref Range   Glucose-Capillary 158 (H) 65 - 99 mg/dL  Glucose, capillary     Status: Abnormal   Collection Time: 11/15/17  6:33 AM  Result Value Ref Range   Glucose-Capillary 143 (H) 65 - 99 mg/dL  Glucose, capillary     Status: Abnormal   Collection Time: 11/15/17 11:32 AM  Result Value Ref Range   Glucose-Capillary 134 (H) 65 - 99 mg/dL  Glucose, capillary     Status: Abnormal   Collection Time: 11/15/17  4:33 PM  Result Value Ref  Range   Glucose-Capillary 138 (H) 65 - 99 mg/dL  Glucose, capillary     Status: Abnormal   Collection Time: 11/15/17  9:13 PM  Result Value Ref Range   Glucose-Capillary 162 (H) 65 - 99 mg/dL  Glucose, capillary     Status: Abnormal   Collection Time: 11/16/17  6:34 AM  Result Value Ref Range   Glucose-Capillary 164 (H) 65 - 99 mg/dL     Constitutional: No distress . Vital signs reviewed. HEENT: EOMI, oral membranes moist Neck: supple Cardiovascular: RRR without murmur. No JVD    Respiratory: CTA Bilaterally without wheezes or rales. Normal effort    GI: BS +, non-tender, non-distended  Skin:   Other PICC site is clean, no erythema Neuro: Alert/Oriented, Abnormal Motor 4/5 in RUE and RLE , 5/5 on Left side and Abnormal FMC Ataxic/ dec FMC. Speech dysarthric Musc/Skel:  Other No pain with UE or LE ROM Gen NAD   Assessment/Plan: 1. Functional deficits secondary to RIght hemiataxia due Right cerebellar ICH which require 3+ hours per day of interdisciplinary therapy in a comprehensive inpatient rehab setting. Physiatrist is providing close team supervision and 24 hour management of active medical problems listed below. Physiatrist and rehab team continue to  assess barriers to discharge/monitor patient progress toward functional and medical goals. FIM: Function - Bathing Position: Shower Body parts bathed by patient: Right arm, Left arm, Chest, Abdomen, Front perineal area, Right upper leg, Left upper leg, Right lower leg, Left lower leg, Buttocks Body parts bathed by helper: Back Assist Level: Touching or steadying assistance(Pt > 75%)  Function- Upper Body Dressing/Undressing What is the patient wearing?: Pull over shirt/dress Bra - Perfomed by patient: Thread/unthread right bra strap, Thread/unthread left bra strap Bra - Perfomed by helper: Hook/unhook bra (pull down sports bra) Pull over shirt/dress - Perfomed by patient: Thread/unthread right sleeve, Thread/unthread left  sleeve, Put head through opening, Pull shirt over trunk Pull over shirt/dress - Perfomed by helper: Pull shirt over trunk Assist Level: Supervision or verbal cues Set up : To obtain clothing/put away Function - Lower Body Dressing/Undressing What is the patient wearing?: Pants, Underwear Position: Wheelchair/chair at sink Underwear - Performed by patient: Thread/unthread right underwear leg, Thread/unthread left underwear leg, Pull underwear up/down Underwear - Performed by helper: Thread/unthread left underwear leg, Pull underwear up/down Pants- Performed by patient: Thread/unthread right pants leg, Thread/unthread left pants leg, Pull pants up/down Pants- Performed by helper: Pull pants up/down Non-skid slipper socks- Performed by patient: Don/doff right sock, Don/doff left sock Non-skid slipper socks- Performed by helper: Don/doff right sock, Don/doff left sock Assist for footwear: Supervision/touching assist Assist for lower body dressing: Touching or steadying assistance (Pt > 75%)  Function - Toileting Toileting activity did not occur: No continent bowel/bladder event Toileting steps completed by patient: Adjust clothing prior to toileting, Performs perineal hygiene, Adjust clothing after toileting Toileting steps completed by helper: Adjust clothing prior to toileting, Performs perineal hygiene, Adjust clothing after toileting Toileting Assistive Devices: Grab bar or rail Assist level: Supervision or verbal cues  Function - Air cabin crew transfer assistive device: Walker Assist level to toilet: Touching or steadying assistance (Pt > 75%) Assist level from toilet: Touching or steadying assistance (Pt > 75%)  Function - Chair/bed transfer Chair/bed transfer method: Ambulatory Chair/bed transfer assist level: Touching or steadying assistance (Pt > 75%) Chair/bed transfer assistive device: Environmental consultant, Armrests  Function - Locomotion: Wheelchair Type: Manual Max wheelchair  distance: 35 ft Assist Level: Touching or steadying assistance (Pt > 75%) Assist Level: Dependent (Pt equals 0%) Assist Level: Dependent (Pt equals 0%) Turns around,maneuvers to table,bed, and toilet,negotiates 3% grade,maneuvers on rugs and over doorsills: No Function - Locomotion: Ambulation Assistive device: Walker-rolling Max distance: 100 ft  Assist level: Touching or steadying assistance (Pt > 75%) Walk 10 feet activity did not occur: Safety/medical concerns Assist level: Touching or steadying assistance (Pt > 75%) Walk 50 feet with 2 turns activity did not occur: Safety/medical concerns Assist level: Touching or steadying assistance (Pt > 75%) Walk 150 feet activity did not occur: Safety/medical concerns Walk 10 feet on uneven surfaces activity did not occur: Safety/medical concerns  Function - Comprehension Comprehension: Auditory Comprehension assist level: Understands basic 50 - 74% of the time/ requires cueing 25 - 49% of the time, Understands basic 75 - 89% of the time/ requires cueing 10 - 24% of the time  Function - Expression Expression: Verbal Expression assist level: Expresses basic 50 - 74% of the time/requires cueing 25 - 49% of the time. Needs to repeat parts of sentences., Expresses basic 75 - 89% of the time/requires cueing 10 - 24% of the time. Needs helper to occlude trach/needs to repeat words.  Function - Social Interaction Social Interaction assist level: Interacts appropriately  75 - 89% of the time - Needs redirection for appropriate language or to initiate interaction., Interacts appropriately 90% of the time - Needs monitoring or encouragement for participation or interaction.  Function - Problem Solving Problem solving assist level: Solves basic 50 - 74% of the time/requires cueing 25 - 49% of the time, Solves basic 75 - 89% of the time/requires cueing 10 - 24% of the time  Function - Memory Memory assist level: Recognizes or recalls 50 - 74% of the  time/requires cueing 25 - 49% of the time, Recognizes or recalls 75 - 89% of the time/requires cueing 10 - 24% of the time Patient normally able to recall (first 3 days only): Current season, Location of own room, That he or she is in a hospital, Staff names and faces  Medical Problem List and Plan: 1.Altered mental status with lethargysecondary to right cerebellar parenchymal hemorrhage felt to be secondary to hypertensive crisis Continue aspirin 81 mg/day until June 7 and then may resume clopidogrel per Neuro PT, OT, SLP, tent D/C for am  2. DVT Prophylaxis/Anticoagulation: Subcutaneous Lovenox 30 mg daily initiated 11/01/2017 3. Pain Management:Tylenol as needed 4. Mood:Provided pt positive emotional support.  5. Neuropsych: This patientiscapable of making decisions on herown behalf. 6. Skin/Wound Care:Routine skin checks 7. 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 Not a candidate for CABG. Patient cleared to begin aspirin 81 mg daily 11/05/2017.Plan for PCI as an outpatient follow-up 9.Decompensated systolic congestive heart failure. Monitor for any signs of fluid overload. Lasix 40 mg daily  -need to record weights Filed Weights   11/04/17 1832 11/04/17 2201 11/10/17 1406  Weight: 60.8 kg (134 lb 0.6 oz) 60.8 kg (134 lb 0.6 oz) 60.4 kg (133 lb 2.5 oz)    10.Hypertension. Coreg 12.5 mg twice daily, Altace 10 mg twice daily, Aldactone 25 mg daily, Nitro-Dur patch 0.2 mg daily Vitals:   11/16/17 0747 11/16/17 0749  BP: (!) 165/65 (!) 165/65  Pulse:  61  Resp:    Temp:    SpO2:    elevated  5/27 will adjust meds, increased NTG patch to .3mg  on 5/25, no response goal is <160 sys, just started 2.5mg  amlodipine and change dose of NTG to home dose .2mg  11.Diabetes mellitus with peripheral neuropathy. Levemir 6 units nightly. Checking blood sugars before meals and at bedtime CBG (last  3)  Recent Labs    11/15/17 1633 11/15/17 2113 11/16/17 0634  GLUCAP 138* 162* 164*  Controlled 5/28 metformin 12.History of breast, colon, endometrial and cervical cancer. Continue hydroxyurea 13 Mild HypoK due to lasix will increase KCL  Supplement to 85meq, 14.  Leukocytosis- Polycythemia Vera Dr Mike Gip is hematologist 15.  HLD- had muscle pain with simvastatin, none with  pravastatin cont 10mg  per day, no muscle pain increase to home dose of 20mg - no muscle pain 5/28 LOS (Days) 12 A FACE TO FACE EVALUATION WAS PERFORMED  Charlett Blake 11/16/2017, 8:08 AM

## 2017-11-17 DIAGNOSIS — I69322 Dysarthria following cerebral infarction: Secondary | ICD-10-CM

## 2017-11-17 DIAGNOSIS — R269 Unspecified abnormalities of gait and mobility: Secondary | ICD-10-CM

## 2017-11-17 DIAGNOSIS — I69398 Other sequelae of cerebral infarction: Secondary | ICD-10-CM

## 2017-11-17 DIAGNOSIS — I10 Essential (primary) hypertension: Secondary | ICD-10-CM

## 2017-11-17 LAB — GLUCOSE, CAPILLARY
Glucose-Capillary: 122 mg/dL — ABNORMAL HIGH (ref 65–99)
Glucose-Capillary: 168 mg/dL — ABNORMAL HIGH (ref 65–99)

## 2017-11-17 MED ORDER — INSULIN DETEMIR 100 UNIT/ML FLEXPEN: 8.0000 [IU] | Freq: Every day | SUBCUTANEOUS | 1 refills | Status: DC

## 2017-11-17 NOTE — Progress Notes (Addendum)
Subjective/Complaints:  24/7 caregiver arranged  ROS: Patient denies N/V/D, SOB, CP jt pain   Objective: Vital Signs: Blood pressure (!) 175/68, pulse (!) 48, temperature 98.7 F (37.1 C), temperature source Oral, resp. rate 18, height 5\' 1"  (1.549 m), weight 60.4 kg (133 lb 2.5 oz), SpO2 96 %. No results found. Results for orders placed or performed during the hospital encounter of 11/04/17 (from the past 72 hour(s))  Glucose, capillary     Status: Abnormal   Collection Time: 11/14/17 11:38 AM  Result Value Ref Range   Glucose-Capillary 142 (H) 65 - 99 mg/dL  Glucose, capillary     Status: Abnormal   Collection Time: 11/14/17  4:44 PM  Result Value Ref Range   Glucose-Capillary 159 (H) 65 - 99 mg/dL  Glucose, capillary     Status: Abnormal   Collection Time: 11/14/17  9:04 PM  Result Value Ref Range   Glucose-Capillary 158 (H) 65 - 99 mg/dL  Glucose, capillary     Status: Abnormal   Collection Time: 11/15/17  6:33 AM  Result Value Ref Range   Glucose-Capillary 143 (H) 65 - 99 mg/dL  Glucose, capillary     Status: Abnormal   Collection Time: 11/15/17 11:32 AM  Result Value Ref Range   Glucose-Capillary 134 (H) 65 - 99 mg/dL  Glucose, capillary     Status: Abnormal   Collection Time: 11/15/17  4:33 PM  Result Value Ref Range   Glucose-Capillary 138 (H) 65 - 99 mg/dL  Glucose, capillary     Status: Abnormal   Collection Time: 11/15/17  9:13 PM  Result Value Ref Range   Glucose-Capillary 162 (H) 65 - 99 mg/dL  Glucose, capillary     Status: Abnormal   Collection Time: 11/16/17  6:34 AM  Result Value Ref Range   Glucose-Capillary 164 (H) 65 - 99 mg/dL  Glucose, capillary     Status: Abnormal   Collection Time: 11/16/17 12:30 PM  Result Value Ref Range   Glucose-Capillary 142 (H) 65 - 99 mg/dL  Glucose, capillary     Status: Abnormal   Collection Time: 11/16/17  4:34 PM  Result Value Ref Range   Glucose-Capillary 166 (H) 65 - 99 mg/dL  Glucose, capillary     Status:  Abnormal   Collection Time: 11/16/17  9:13 PM  Result Value Ref Range   Glucose-Capillary 144 (H) 65 - 99 mg/dL  Glucose, capillary     Status: Abnormal   Collection Time: 11/17/17  6:39 AM  Result Value Ref Range   Glucose-Capillary 122 (H) 65 - 99 mg/dL     Constitutional: No distress . Vital signs reviewed. HEENT: EOMI, oral membranes moist Neck: supple Cardiovascular: RRR without murmur. No JVD    Respiratory: CTA Bilaterally without wheezes or rales. Normal effort    GI: BS +, non-tender, non-distended  Skin:   Other PICC site is clean, no erythema Neuro: Alert/Oriented,  Speech dysarthric Musc/Skel:  Other No pain with UE or LE ROM Gen NAD   Assessment/Plan: 1. Functional deficits secondary to RIght hemiataxia due Right cerebellar ICH  Stable for D/C today F/u PCP in 3-4 weeks F/u PM&R 2 weeks See D/C summary See D/C instructions FIM: Function - Bathing Position: Shower Body parts bathed by patient: Right arm, Left arm, Chest, Abdomen, Front perineal area, Right upper leg, Left upper leg, Right lower leg, Left lower leg, Buttocks, Back Body parts bathed by helper: Back Assist Level: Supervision or verbal cues  Function- Upper Body Dressing/Undressing What  is the patient wearing?: Pull over shirt/dress Bra - Perfomed by patient: Thread/unthread right bra strap, Thread/unthread left bra strap Bra - Perfomed by helper: Hook/unhook bra (pull down sports bra) Pull over shirt/dress - Perfomed by patient: Thread/unthread right sleeve, Thread/unthread left sleeve, Put head through opening, Pull shirt over trunk Pull over shirt/dress - Perfomed by helper: Pull shirt over trunk Assist Level: Supervision or verbal cues Set up : To obtain clothing/put away Function - Lower Body Dressing/Undressing What is the patient wearing?: Pants, Underwear, Shoes, Socks Position: Wheelchair/chair at sink Underwear - Performed by patient: Thread/unthread right underwear leg,  Thread/unthread left underwear leg, Pull underwear up/down Underwear - Performed by helper: Thread/unthread left underwear leg, Pull underwear up/down Pants- Performed by patient: Thread/unthread right pants leg, Thread/unthread left pants leg, Pull pants up/down, Fasten/unfasten pants Pants- Performed by helper: Pull pants up/down Non-skid slipper socks- Performed by patient: Don/doff right sock, Don/doff left sock Non-skid slipper socks- Performed by helper: Don/doff right sock, Don/doff left sock Socks - Performed by patient: Don/doff right sock, Don/doff left sock Shoes - Performed by patient: Don/doff right shoe, Don/doff left shoe(Slip ons) Assist for footwear: Supervision/touching assist Assist for lower body dressing: Supervision or verbal cues  Function - Toileting Toileting activity did not occur: No continent bowel/bladder event Toileting steps completed by patient: Adjust clothing prior to toileting, Performs perineal hygiene, Adjust clothing after toileting Toileting steps completed by helper: Adjust clothing prior to toileting, Performs perineal hygiene, Adjust clothing after toileting Toileting Assistive Devices: Grab bar or rail Assist level: Supervision or verbal cues  Function - Air cabin crew transfer assistive device: Environmental consultant, Grab bar Assist level to toilet: Supervision or verbal cues Assist level from toilet: Supervision or verbal cues  Function - Chair/bed transfer Chair/bed transfer method: Ambulatory Chair/bed transfer assist level: Touching or steadying assistance (Pt > 75%) Chair/bed transfer assistive device: Walker Chair/bed transfer details: Verbal cues for technique, Verbal cues for sequencing, Verbal cues for precautions/safety  Function - Locomotion: Wheelchair Will patient use wheelchair at discharge?: (transport w/c) Type: Manual Wheelchair activity did not occur: Safety/medical concerns Max wheelchair distance: 35 ft Assist Level: Touching  or steadying assistance (Pt > 75%) Wheel 50 feet with 2 turns activity did not occur: Safety/medical concerns Assist Level: Dependent (Pt equals 0%) Wheel 150 feet activity did not occur: Safety/medical concerns Assist Level: Dependent (Pt equals 0%) Turns around,maneuvers to table,bed, and toilet,negotiates 3% grade,maneuvers on rugs and over doorsills: No Function - Locomotion: Ambulation Assistive device: Walker-rolling Max distance: 10 ft  Assist level: Touching or steadying assistance (Pt > 75%) Walk 10 feet activity did not occur: Safety/medical concerns Assist level: Touching or steadying assistance (Pt > 75%) Walk 50 feet with 2 turns activity did not occur: Safety/medical concerns Assist level: Touching or steadying assistance (Pt > 75%) Walk 150 feet activity did not occur: Safety/medical concerns Walk 10 feet on uneven surfaces activity did not occur: Safety/medical concerns  Function - Comprehension Comprehension: Auditory Comprehension assist level: Understands basic 75 - 89% of the time/ requires cueing 10 - 24% of the time  Function - Expression Expression: Verbal Expression assist level: Expresses basic 50 - 74% of the time/requires cueing 25 - 49% of the time. Needs to repeat parts of sentences.  Function - Social Interaction Social Interaction assist level: Interacts appropriately 75 - 89% of the time - Needs redirection for appropriate language or to initiate interaction.  Function - Problem Solving Problem solving assist level: Solves basic 50 - 74% of the time/requires  cueing 25 - 49% of the time  Function - Memory Memory assist level: Recognizes or recalls 50 - 74% of the time/requires cueing 25 - 49% of the time Patient normally able to recall (first 3 days only): Current season, Location of own room, That he or she is in a hospital, Staff names and faces  Medical Problem List and Plan: 1.Altered mental status with lethargysecondary to right cerebellar  parenchymal hemorrhage felt to be secondary to hypertensive crisis Continue aspirin 81 mg/day until June 7 and then may resume clopidogrel per Neuro PT, OT, SLP, D/C  today  2. DVT Prophylaxis/Anticoagulation: Subcutaneous Lovenox 30 mg daily initiated 11/01/2017 3. Pain Management:Tylenol as needed 4. Mood:Provided pt positive emotional support.  5. Neuropsych: This patientiscapable of making decisions on herown behalf. 6. Skin/Wound Care:Routine skin checks 7. 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 Not a candidate for CABG. Patient cleared to begin aspirin 81 mg daily 11/05/2017.Plan for PCI as an outpatient follow-up 9.Decompensated systolic congestive heart failure. Monitor for any signs of fluid overload. Lasix 40 mg daily  -need to record weights Filed Weights   11/04/17 1832 11/04/17 2201 11/10/17 1406  Weight: 60.8 kg (134 lb 0.6 oz) 60.8 kg (134 lb 0.6 oz) 60.4 kg (133 lb 2.5 oz)    10.Hypertension. Coreg 12.5 mg twice daily, Altace 10 mg twice daily, Aldactone 25 mg daily, Nitro-Dur patch 0.2 mg daily Vitals:   11/17/17 0511 11/17/17 0820  BP: (!) 179/72 (!) 175/68  Pulse: (!) 48   Resp: 18   Temp: 98.7 F (37.1 C)   SpO2: 96%   elevated  5/27 will adjust meds, increased NTG patch to .3mg  on 5/25, no response goal is <160 sys, just started 2.5mg  amlodipine , will need PCP f/u to monitor dose systolic was running in 793J yesterday Cont NTG  .2mg  11.Diabetes mellitus with peripheral neuropathy. Levemir 6 units nightly. Checking blood sugars before meals and at bedtime CBG (last 3)  Recent Labs    11/16/17 1634 11/16/17 2113 11/17/17 0639  GLUCAP 166* 144* 122*  Controlled 5/29 metformin 12.History of breast, colon, endometrial and cervical cancer. Continue hydroxyurea 13 Mild HypoK due to lasix will increase KCL  Supplement to 76meq, 14.  Leukocytosis- Polycythemia  Vera Dr Mike Gip is hematologist 15.  HLD- had muscle pain with simvastatin, none with  pravastatin cont 10mg  per day, no muscle pain increase to home dose of 20mg - no muscle pain 5/29 LOS (Days) 13 A FACE TO FACE EVALUATION WAS PERFORMED  Charlett Blake 11/17/2017, 8:32 AM

## 2017-11-17 NOTE — Progress Notes (Signed)
Patient discharged to home, accompanied by her niece.

## 2017-11-18 ENCOUNTER — Telehealth: Payer: Self-pay

## 2017-11-18 NOTE — Telephone Encounter (Signed)
Transitional Care call-Sue Ellen-niece    1. Are you/is patient experiencing any problems since coming home? No Are there any questions regarding any aspect of care? More cardiology questions  2. Are there any questions regarding medications administration/dosing? No  3.  Are meds being taken as prescribed? YesPatient should review meds with caller to confirm 4. Have there been any falls? No 5. Has Home Health been to the house and/or have they contacted you? Yes  6. Are bowels and bladder emptying properly? Yes 7. Are there any unexpected incontinence issues? No 8. Any fevers, problems with breathing, unexpected pain? No 9. Are there any skin problems or new areas of breakdown? Bruising 10. Has the patient/family member arranged specialty MD follow up (ie cardiology/neurology/renal/surgical/etc)? Not yet Can we help arrange? 11. Does the patient need any other services or support that we can help arrange? No 12. Are caregivers following through as expected in assisting the patient? Yes 13. Has the patient quit smoking, drinking alcohol, or using drugs as recommended? Yes  Appointment time, Monday 11/29/17 arrive at 12:00 for 12:30 appt  [place] [address]

## 2017-11-22 ENCOUNTER — Ambulatory Visit: Payer: Medicare Other | Admitting: Hematology and Oncology

## 2017-11-22 ENCOUNTER — Other Ambulatory Visit: Payer: Medicare Other

## 2017-11-22 ENCOUNTER — Inpatient Hospital Stay: Payer: Medicare Other | Attending: Hematology and Oncology

## 2017-11-26 ENCOUNTER — Encounter: Payer: Medicare Other | Attending: Physical Medicine & Rehabilitation | Admitting: Registered Nurse

## 2017-11-26 ENCOUNTER — Encounter: Payer: Self-pay | Admitting: Registered Nurse

## 2017-11-26 ENCOUNTER — Other Ambulatory Visit
Admission: RE | Admit: 2017-11-26 | Discharge: 2017-11-26 | Disposition: A | Discharge status: Home / Self Care | Payer: Medicare Other | Source: Ambulatory Visit | Attending: Cardiology | Admitting: Cardiology

## 2017-11-26 VITALS — BP 104/66 | HR 68 | Ht 61.0 in | Wt 109.0 lb

## 2017-11-26 DIAGNOSIS — I2510 Atherosclerotic heart disease of native coronary artery without angina pectoris: Secondary | ICD-10-CM | POA: Insufficient documentation

## 2017-11-26 DIAGNOSIS — I5021 Acute systolic (congestive) heart failure: Secondary | ICD-10-CM

## 2017-11-26 DIAGNOSIS — Z8673 Personal history of transient ischemic attack (TIA), and cerebral infarction without residual deficits: Secondary | ICD-10-CM | POA: Insufficient documentation

## 2017-11-26 DIAGNOSIS — Z8249 Family history of ischemic heart disease and other diseases of the circulatory system: Secondary | ICD-10-CM | POA: Diagnosis not present

## 2017-11-26 DIAGNOSIS — Z833 Family history of diabetes mellitus: Secondary | ICD-10-CM | POA: Insufficient documentation

## 2017-11-26 DIAGNOSIS — E785 Hyperlipidemia, unspecified: Secondary | ICD-10-CM | POA: Insufficient documentation

## 2017-11-26 DIAGNOSIS — Z807 Family history of other malignant neoplasms of lymphoid, hematopoietic and related tissues: Secondary | ICD-10-CM | POA: Insufficient documentation

## 2017-11-26 DIAGNOSIS — Z9889 Other specified postprocedural states: Secondary | ICD-10-CM | POA: Diagnosis not present

## 2017-11-26 DIAGNOSIS — I10 Essential (primary) hypertension: Secondary | ICD-10-CM | POA: Insufficient documentation

## 2017-11-26 DIAGNOSIS — Z09 Encounter for follow-up examination after completed treatment for conditions other than malignant neoplasm: Secondary | ICD-10-CM | POA: Insufficient documentation

## 2017-11-26 DIAGNOSIS — Z853 Personal history of malignant neoplasm of breast: Secondary | ICD-10-CM | POA: Insufficient documentation

## 2017-11-26 DIAGNOSIS — Z8679 Personal history of other diseases of the circulatory system: Secondary | ICD-10-CM | POA: Diagnosis not present

## 2017-11-26 DIAGNOSIS — Z9089 Acquired absence of other organs: Secondary | ICD-10-CM | POA: Diagnosis not present

## 2017-11-26 DIAGNOSIS — Z8542 Personal history of malignant neoplasm of other parts of uterus: Secondary | ICD-10-CM | POA: Diagnosis not present

## 2017-11-26 DIAGNOSIS — I619 Nontraumatic intracerebral hemorrhage, unspecified: Secondary | ICD-10-CM | POA: Diagnosis not present

## 2017-11-26 DIAGNOSIS — Z87891 Personal history of nicotine dependence: Secondary | ICD-10-CM | POA: Insufficient documentation

## 2017-11-26 DIAGNOSIS — E119 Type 2 diabetes mellitus without complications: Secondary | ICD-10-CM | POA: Insufficient documentation

## 2017-11-26 DIAGNOSIS — E7849 Other hyperlipidemia: Secondary | ICD-10-CM

## 2017-11-26 DIAGNOSIS — Z8541 Personal history of malignant neoplasm of cervix uteri: Secondary | ICD-10-CM

## 2017-11-26 DIAGNOSIS — Z85038 Personal history of other malignant neoplasm of large intestine: Secondary | ICD-10-CM | POA: Diagnosis not present

## 2017-11-26 DIAGNOSIS — Z85828 Personal history of other malignant neoplasm of skin: Secondary | ICD-10-CM | POA: Insufficient documentation

## 2017-11-26 DIAGNOSIS — I110 Hypertensive heart disease with heart failure: Secondary | ICD-10-CM | POA: Insufficient documentation

## 2017-11-26 LAB — HEMOGLOBIN A1C
Hgb A1c MFr Bld: 7 % — ABNORMAL HIGH (ref 4.8–5.6)
Mean Plasma Glucose: 154.2 mg/dL

## 2017-11-26 LAB — BASIC METABOLIC PANEL
Anion gap: 10 (ref 5–15)
BUN: 42 mg/dL — ABNORMAL HIGH (ref 6–20)
CO2: 25 mmol/L (ref 22–32)
Calcium: 9.3 mg/dL (ref 8.9–10.3)
Chloride: 98 mmol/L — ABNORMAL LOW (ref 101–111)
Creatinine, Ser: 1.63 mg/dL — ABNORMAL HIGH (ref 0.44–1.00)
GFR calc Af Amer: 33 mL/min — ABNORMAL LOW (ref >60)
GFR calc non Af Amer: 28 mL/min — ABNORMAL LOW (ref >60)
Glucose, Bld: 260 mg/dL — ABNORMAL HIGH (ref 65–99)
Potassium: 4.6 mmol/L (ref 3.5–5.1)
Sodium: 133 mmol/L — ABNORMAL LOW (ref 135–145)

## 2017-11-26 MED ORDER — METHYLPHENIDATE HCL 5 MG PO TABS: 5.0000 mg | ORAL_TABLET | Freq: Two times a day (BID) | ORAL | 0 refills | Status: DC

## 2017-11-29 ENCOUNTER — Encounter: Payer: Medicare Other | Admitting: Physical Medicine & Rehabilitation

## 2017-11-29 ENCOUNTER — Encounter: Payer: Self-pay | Admitting: Registered Nurse

## 2017-11-29 NOTE — Progress Notes (Signed)
Subjective:    Patient ID: [name], female    DOB: 1931-10-02, 84 y.o.   MRN: [id number]  HPI: [name] is a 84 year old female who is here for a transitional care visit in follow of her cerebral parenchymal hemorrhage, acute systolic congestive heart failure, hypertension, hyperlipidemia and history of cervical cancer. [name] has been home with home health [facility].  She denies any pain at this time. She rates her pain 0. She reports her appetite is fair.  [name] arrives in wheelchair.   Ms, [name] Niece in room, all questions answered, she is POA.  Pain Inventory Average Pain 0 Pain Right Now 0 My pain is na  In the last 24 hours, has pain interfered with the following? General activity 0 Relation with others 0 Enjoyment of life 0 What TIME of day is your pain at its worst? na Sleep (in general) NA  Pain is worse with: na Pain improves with: na Relief from Meds: na  Mobility walk with assistance use a walker how many minutes can you walk? yes 1-2 ability to climb steps?  no  Function retired I need assistance with the following:  feeding, dressing, bathing, toileting, meal prep, household duties and shopping  Neuro/Psych weakness trouble walking dizziness depression  Prior Studies x-rays  Physicians involved in your care Primary care [place]   Family History  Problem Relation Age of Onset  . Cancer Brother        AML  . Heart attack Mother   . Breast cancer Mother   . Heart attack Father   . Heart attack Sister   . Diabetes Sister    Social History   Socioeconomic History  . Marital status: Widowed    Spouse name: Not on file  . Number of children: Not on file  . Years of education: Not on file  . Highest education level: Not on file  Occupational History  . Not on file  Social Needs  . Financial resource strain: Not on file  . Food insecurity:    Worry: Not on file    Inability: Not on file  .  Transportation needs:    Medical: Not on file    Non-medical: Not on file  Tobacco Use  . Smoking status: Former Research scientist (life sciences)  . Smokeless tobacco: Never Used  . Tobacco comment: quit 30+ yrs ago  Substance and Sexual Activity  . Alcohol use: No  . Drug use: No  . Sexual activity: Not on file  Lifestyle  . Physical activity:    Days per week: Not on file    Minutes per session: Not on file  . Stress: Not on file  Relationships  . Social connections:    Talks on phone: Not on file    Gets together: Not on file    Attends religious service: Not on file    Active member of club or organization: Not on file    Attends meetings of clubs or organizations: Not on file    Relationship status: Not on file  Other Topics Concern  . Not on file  Social History Narrative  . Not on file   Past Surgical History:  Procedure Laterality Date  . ABDOMINAL HYSTERECTOMY    . BOWEL RESECTION N/A 03/28/2015   Procedure: SMALL BOWEL RESECTION;  Surgeon: Leonie Green, MD;  Location: [facility];  Service: General;  Laterality: N/A;  . CATARACT EXTRACTION W/ INTRAOCULAR LENS IMPLANT  Right   . CATARACT EXTRACTION W/PHACO Left 10/07/2015   Procedure: CATARACT EXTRACTION PHACO AND INTRAOCULAR LENS PLACEMENT (IOC);  Surgeon: Ronnell Freshwater, MD;  Location: [facility];  Service: Ophthalmology;  Laterality: Left;  DIABETIC - oral meds VISION BLUE  . CORONARY ANGIOGRAPHY N/A 10/29/2017   Procedure: CORONARY ANGIOGRAPHY;  Surgeon: Dionisio David, MD;  Location: [place] [facility];  Service: Cardiovascular;  Laterality: N/A;  . EXPLORATORY LAPAROTOMY     for fibroids  . LEFT HEART CATH Right 10/29/2017   Procedure: Left Heart Cath;  Surgeon: Dionisio David, MD;  Location: [facility];  Service: Cardiovascular;  Laterality: Right;  . TONSILLECTOMY     Past Medical History:  Diagnosis Date  . Adenocarcinoma in situ of cervix   . Arthritis    hands  . Breast cancer ([place])   .  Colon cancer ([place])   . Diabetes mellitus without complication ([place])   . Endometrial carcinoma ([facility])    s/p total abdominal hysterectomy  . H/O compression fracture of spine 2014   thoracic spine  . H/O polycythemia vera   . H/O TIA (transient ischemic attack) and stroke 09/2014, 03/2015   No deficits  . Hypertension   . Hyperuricemia   . Microalbuminuria   . Polycythemia vera ([place])   . Recurrent falls   . Skin cancer    face, legs  . Stroke [facility]) 2008   no deficits  . Trochanteric bursitis   . Varicose veins    treated   BP 104/66   Pulse 68   Ht 5\' 1"  (1.549 m)   Wt 109 lb (49.4 kg)   SpO2 96%   BMI 20.60 kg/m   Opioid Risk Score:   Fall Risk Score:  `1  Depression screen PHQ 2/9  Depression screen PHQ 2/9 11/26/2017  Decreased Interest 1  Down, Depressed, Hopeless 1  PHQ - 2 Score 2  Altered sleeping 1  Tired, decreased energy 3  Change in appetite 0  Feeling bad or failure about yourself  0  Trouble concentrating 3  Moving slowly or fidgety/restless 3  Suicidal thoughts 1  PHQ-9 Score 13    Review of Systems  Constitutional:       Weight loss, high blood sugar  Cardiovascular:       Limb swelling  Gastrointestinal: Positive for abdominal pain, constipation, nausea and vomiting.  All other systems reviewed and are negative.      Objective:   Physical Exam  Constitutional: She is oriented to person, place, and time. She appears well-developed and well-nourished.  HENT:  Head: Normocephalic and atraumatic.  Neck: Normal range of motion. Neck supple.  Cardiovascular: Normal rate and regular rhythm.  Pulmonary/Chest: Effort normal and breath sounds normal.  Musculoskeletal:  Normal Muscle Bulk and Muscle Testing Reveals: Upper Extremities: Full ROM and Muscle Strength 5/5 Lower Extremities Full ROM and Muscle Strength on the Right 5/5 and Left 4/5 Arrived in wheelchair  Neurological: She is alert and oriented to person, place, and time.  Skin: Skin  is warm and dry.  Psychiatric: She has a normal mood and affect.  Nursing note and vitals reviewed.         Assessment & Plan:  1. Cerebral Parenchymal Hemorrhage: Neurology Following. Continue Home Heath Therapies with [facility].  2. Acute Systolic Congestive Heart Failure: Cardiology Following. 3. Essential Hypertension: Cardiology Following. 4. Hyperlipidemia: Cardiology Following 5. History of Cervical Cancer: PCP Following.   20 minutes of  face to face patient care time was spent during this visit. All questions were encouraged and answered.  F/U with Dr. Letta Pate in 4-6 weeks

## 2017-12-03 DIAGNOSIS — I629 Nontraumatic intracranial hemorrhage, unspecified: Secondary | ICD-10-CM | POA: Insufficient documentation

## 2017-12-13 ENCOUNTER — Other Ambulatory Visit: Payer: Self-pay | Admitting: Registered Nurse

## 2017-12-17 ENCOUNTER — Encounter: Payer: Self-pay | Admitting: Adult Health

## 2017-12-17 ENCOUNTER — Ambulatory Visit: Payer: Medicare Other | Admitting: Adult Health

## 2017-12-17 VITALS — BP 124/69 | HR 63 | Ht 61.0 in | Wt 111.0 lb

## 2017-12-17 DIAGNOSIS — I615 Nontraumatic intracerebral hemorrhage, intraventricular: Secondary | ICD-10-CM

## 2017-12-17 DIAGNOSIS — Z794 Long term (current) use of insulin: Secondary | ICD-10-CM

## 2017-12-17 DIAGNOSIS — I614 Nontraumatic intracerebral hemorrhage in cerebellum: Secondary | ICD-10-CM | POA: Diagnosis not present

## 2017-12-17 DIAGNOSIS — E118 Type 2 diabetes mellitus with unspecified complications: Secondary | ICD-10-CM | POA: Diagnosis not present

## 2017-12-17 DIAGNOSIS — E7849 Other hyperlipidemia: Secondary | ICD-10-CM | POA: Diagnosis not present

## 2017-12-17 DIAGNOSIS — I10 Essential (primary) hypertension: Secondary | ICD-10-CM

## 2017-12-17 MED ORDER — FEXOFENADINE HCL 180 MG PO TABS: 180.0000 mg | ORAL_TABLET | Freq: Every day | ORAL | Status: AC

## 2017-12-17 MED ORDER — CLOPIDOGREL BISULFATE 75 MG PO TABS: 75.0000 mg | ORAL_TABLET | Freq: Every day | ORAL | 4 refills | Status: DC

## 2017-12-17 NOTE — Progress Notes (Signed)
[facility] [address] [place]. [place] [address] [phone]       OFFICE FOLLOW UP NOTE  Ms. [name] Date of Birth:  11/30/1931 Medical Record Number:  [id number]   Reason for Referral:  hospital stroke follow up  CHIEF COMPLAINT:  Chief Complaint  Patient presents with  . New Patient (Initial Visit)    Patient making improvements.    HPI: [name] is being seen today for initial visit in the office for right ICH of cerebellum on 10/29/17. History obtained from patient and chart review. Reviewed all radiology images and labs personally.  Lynita Groseclose Toneyis an 84 y.o.femalewith a PMH of Stroke, Recurrent Falls, Polycythemia Vera,Microalbuminuria, HTN, TIA, Endometrial Carcinoma, Diabetes Mellitus, Colon Cancer, Breast Cancer, Arthritis, and Adenocarcinoma in Situ of Cervixpresented to [facility] with black emesis and unresponsive 2/2 hypoxia to [facility] on 10/28/17. CTA Chest revealed pericardial effusion, small pleural effusions, and pulmonary edema. She was treated for sepsis secondary to pneumonia. She is on ASA and Plavix. She was noted to have NSTEMI and underwent cardiac cath which showed 3 vessel disease and severe LV dysfunction. Following cath procedure patient had a head CT which showedright cerebellar hemorrhagewith IVH.She was supposed to receive heparin drip, but was held. Patient transferred to [facility] hospital for further management.Hemorrhage was felt to be secondary to hypertensive etiology on dual antiplatelet therapy.  Neuro wise, she was at risk for cerebral edema and treated with 3%.  There was no neurosurgery intervention that would benefit her.  She continued to progress though still had issues with pulmonary edema and CHF.  Developed leukocytosis, etiology unknown, but resolved during admission.  Once stable, was transferred to the inpatient rehab for ongoing therapy.  Plans are for PCI with stent in the near future. Recommended cardiology  to follow-up in 2 weeks. Recommended to resume aspirin at time of discharge from inpatient (7 days) and resume Plavix in 3 weeks.  Patient is being seen today for hospital follow-up and is accompanied by her niece.  She continues to improve from a neurological standpoint but does have mild right-sided weakness along with dysarthria.  She continues to participate in home PT/OT/ST.  She does live independently but does have 24/7 care.  Continues to take aspirin 81 mg at this time without bleeding or bruising.  They had not start Plavix as they state they need clearance from [place] first.  Continues to take pravastatin without myalgias.  Per niece, cardiologist states she needs to be on Plavix for 2 weeks prior to being considered for stent placement.  Blood pressure today satisfactory 124/69.  Patient denies new or worsening stroke/TIA symptoms.  ROS:   14 system review of systems performed and negative with exception of weight loss, fatigue, ringing in ears, moles, constipation, mucus in eyes, allergies, runny nose, weakness, slurred speech, too much sleep, decreased energy, change in appetite, sleepiness, snoring, and restless legs  PMH:  Past Medical History:  Diagnosis Date  . Adenocarcinoma in situ of cervix   . Arthritis    hands  . Breast cancer ([place])   . Colon cancer ([place])   . Diabetes mellitus without complication ([place])   . Endometrial carcinoma ([facility])    s/p total abdominal hysterectomy  . H/O compression fracture of spine 2014   thoracic spine  . H/O polycythemia vera   . H/O TIA (transient ischemic attack) and stroke 09/2014, 03/2015   No deficits  . Hypertension   . Hyperuricemia   .  Microalbuminuria   . Polycythemia vera ([place])   . Recurrent falls   . Skin cancer    face, legs  . Stroke [facility]) 2008   no deficits  . Trochanteric bursitis   . Varicose veins    treated    PSH:  Past Surgical History:  Procedure Laterality Date  . ABDOMINAL HYSTERECTOMY    . BOWEL RESECTION  N/A 03/28/2015   Procedure: SMALL BOWEL RESECTION;  Surgeon: Leonie Green, MD;  Location: [facility] ORS;  Service: General;  Laterality: N/A;  . CATARACT EXTRACTION W/ INTRAOCULAR LENS IMPLANT Right   . CATARACT EXTRACTION W/PHACO Left 10/07/2015   Procedure: CATARACT EXTRACTION PHACO AND INTRAOCULAR LENS PLACEMENT (IOC);  Surgeon: Ronnell Freshwater, MD;  Location: [place];  Service: Ophthalmology;  Laterality: Left;  DIABETIC - oral meds VISION BLUE  . CORONARY ANGIOGRAPHY N/A 10/29/2017   Procedure: CORONARY ANGIOGRAPHY;  Surgeon: Dionisio David, MD;  Location: [place] CV LAB;  Service: Cardiovascular;  Laterality: N/A;  . EXPLORATORY LAPAROTOMY     for fibroids  . LEFT HEART CATH Right 10/29/2017   Procedure: Left Heart Cath;  Surgeon: Dionisio David, MD;  Location: [place] CV LAB;  Service: Cardiovascular;  Laterality: Right;  . TONSILLECTOMY      Social History:  Social History   Socioeconomic History  . Marital status: Widowed    Spouse name: Not on file  . Number of children: Not on file  . Years of education: Not on file  . Highest education level: Not on file  Occupational History  . Not on file  Social Needs  . Financial resource strain: Not on file  . Food insecurity:    Worry: Not on file    Inability: Not on file  . Transportation needs:    Medical: Not on file    Non-medical: Not on file  Tobacco Use  . Smoking status: Former Research scientist (life sciences)  . Smokeless tobacco: Never Used  . Tobacco comment: quit 30+ yrs ago  Substance and Sexual Activity  . Alcohol use: No  . Drug use: No  . Sexual activity: Not on file  Lifestyle  . Physical activity:    Days per week: Not on file    Minutes per session: Not on file  . Stress: Not on file  Relationships  . Social connections:    Talks on phone: Not on file    Gets together: Not on file    Attends religious service: Not on file    Active member of club or organization: Not on file    Attends  meetings of clubs or organizations: Not on file    Relationship status: Not on file  . Intimate partner violence:    Fear of current or ex partner: Not on file    Emotionally abused: Not on file    Physically abused: Not on file    Forced sexual activity: Not on file  Other Topics Concern  . Not on file  Social History Narrative  . Not on file    Family History:  Family History  Problem Relation Age of Onset  . Cancer Brother        AML  . Heart attack Mother   . Breast cancer Mother   . Heart attack Father   . Heart attack Sister   . Diabetes Sister     Medications:   Current Outpatient Medications on File Prior to Visit  Medication Sig Dispense Refill  . amLODipine (NORVASC)  2.5 MG tablet Take 1 tablet (2.5 mg total) by mouth daily. 30 tablet 0  . aspirin 81 MG chewable tablet Chew 1 tablet (81 mg total) by mouth daily.    . blood glucose meter kit and supplies Dispense based on patient and insurance preference. Use up to four times daily as directed. (FOR ICD-10 E10.9, E11.9). 1 each 0  . calcium-vitamin D (OSCAL WITH D) 500-200 MG-UNIT per tablet Take 1 tablet by mouth daily.     . carvedilol (COREG) 25 MG tablet Take 1 tablet (25 mg total) by mouth 2 (two) times daily with a meal. 60 tablet 0  . docusate sodium (COLACE) 250 MG capsule Take 250 mg by mouth daily as needed for constipation.    . hydroxyurea (HYDREA) 500 MG capsule Take 1 tablet daily except on Saturdays 30 capsule 0  . insulin detemir (LEVEMIR) 100 unit/ml SOLN Inject 0.08 mLs (8 Units total) into the skin at bedtime. (Patient taking differently: Inject 9 Units into the skin at bedtime. ) 8 mL 1  . methylphenidate (RITALIN) 5 MG tablet Take 1 tablet (5 mg total) by mouth 2 (two) times daily with breakfast and lunch. 60 tablet 0  . nitroGLYCERIN (NITRODUR - DOSED IN MG/24 HR) 0.2 mg/hr patch Place 1 patch (0.2 mg total) onto the skin daily. 30 patch 12  . pantoprazole (PROTONIX) 40 MG tablet Take 1 tablet (40  mg total) by mouth daily. 30 tablet 0  . pravastatin (PRAVACHOL) 20 MG tablet Take 1 tablet (20 mg total) by mouth at bedtime. 30 tablet 0  . Probiotic Product (PROBIOTIC DAILY PO) Take 1 tablet by mouth daily.    . ramipril (ALTACE) 10 MG capsule Take 1 capsule (10 mg total) by mouth 2 (two) times daily. 60 capsule 0  . spironolactone (ALDACTONE) 25 MG tablet Take 1 tablet (25 mg total) by mouth daily. 30 tablet 0   No current facility-administered medications on file prior to visit.     Allergies:   Allergies  Allergen Reactions  . Simvastatin     Other reaction(s): Muscle Pain     Physical Exam  Vitals:   12/17/17 1112  BP: 124/69  Pulse: 63  Weight: 111 lb (50.3 kg)  Height: _0  (1.549 m)   Body mass index is 20.97 kg/m. No exam data present  General: Frail elderly pleasant Caucasian female, seated, in no evident distress Head: head normocephalic and atraumatic.   Neck: supple with no carotid or supraclavicular bruits Cardiovascular: regular rate and rhythm, no murmurs Musculoskeletal: no deformity Skin:  no rash/petichiae Vascular:  Normal pulses all extremities  Neurologic Exam Mental Status: Awake and fully alert.  Mild dysarthria present.  Oriented to place and time. Recent and remote memory intact. Attention span, concentration and fund of knowledge appropriate. Mood and affect appropriate.  Cranial Nerves: Fundoscopic exam reveals sharp disc margins. Pupils equal, briskly reactive to light. Extraocular movements full without nystagmus. Visual fields full to confrontation. Hearing intact. Facial sensation intact. Face, tongue, palate moves normally and symmetrically.  Motor: Normal bulk and tone.  Unable to appreciate weakness in right upper extremity but mild weakness present in right grip and right hip flexor Sensory.: intact to touch , pinprick , position and vibratory sensation.  Coordination: Rapid alternating movements normal in all extremities.  Finger-to-nose and heel-to-shin performed accurately bilaterally. Gait and Station: Arises from chair without difficulty. Stance is normal. Gait demonstrates normal stride length and balance and does use Rollator to assist with ambulation  with long distance.  Reflexes: 1+ and symmetric. Toes downgoing.    NIHSS  1 Modified Rankin  2    Diagnostic Data (Labs, Imaging, Testing)  Ct Head Wo Contrast 10/29/2017 Stable RIGHT cerebellar hemorrhage, with extension into the IV ventricle without hydrocephalus.  Ct Head Wo Contrast 10/29/2017 RIGHT cerebellar parenchymal hemorrhage approximately 31 x 16 x 19 mm. There is extension of hemorrhage into the fourth ventricle, without hydrocephalus. A hypertensive bleed is most likely, but unrecognized trauma, anticoagulation, vascular malformation, hemorrhagic metastatic neoplasm, are all considerations.   Ct Head Wo Contrast 10/31/2017 Stable RIGHT cerebellar parenchymal hemorrhage, IVH with no hydrocephalus   Ct Head Wo Contrast 11/03/2017 1. Stable small hemorrhage within the right cerebellar hemisphere. Stable mild associated edema and local mass effect. 2. No new acute intracranial abnormality. 3. Stable chronic microvascular ischemic changes and parenchymal volume loss of the brain.  MRI head 11/01/2017 1. Unchanged right cerebellar hematoma with mild intraventricular extension. No hydrocephalus. 2. No acute infarct separate from the hematoma. 3. Moderate chronic small vessel ischemic disease.  Transthoracic Echocardiogram  10/29/2017 - Left ventricle: The cavity size was severely dilated. Systolicfunction was severely reduced. The estimated ejection fractionwas 25%.Akinesis of the anteroseptal myocardium. Akinesis of theanterior myocardium. - Aortic valve: There was trivial regurgitation. Valve area (VTI):1.68 cm^2. Valve area (Vmax): 1.9 cm^2. Valve area (Vmean): 1.81cm^2. - Mitral valve: There was mild regurgitation. Valve  area bycontinuity equation (using LVOT flow): 2.19 cm^2. - Left atrium: The atrium was mildly dilated. - Right ventricle: The cavity size was mildly dilated. - Right atrium: The atrium was mildly dilated. - Pericardium, extracardiac: A trivial pericardial effusion wasidentified posterior to the heart. Features were not consistentwith tamponade physiology. There was a left pleural effusion. - 4 Chamber dilatation with severe LV systolic dysfunction withanteroapical akinesis sugestive ASWMI, and no thrombii present inLV. Fibrocalcified aortic valve without AS.  Dg Chest 1 View 10/28/2017 1. Cardiomegaly with new perihilar opacities, presumably pulmonary edema, suggesting CHF/volume overload. Pneumonia is considered less likely. 2. Aortic atherosclerosis.   Ct Angio Chest Pe W And/or Wo Contrast 10/28/2017 1. Pericardial effusion and cardiomegaly.Coronary artery disease.  2. Parenchymal changes consistent with pulmonary edema and small pleural effusions.  3. Atherosclerotic calcification of the thoracic aorta stable aneurysmal dilatationof the aorticarch. Recommend annual imaging followup by CTA or MRA.This recommendation follows 2010 ACCF/AHA/AATS/ACR/ASA/SCA/SCAI/SIR/STS/SVM Guidelines for the Diagnosis and Management of Patients with Thoracic Aortic Disease. Circulation.2010; 121: B510-C585  4. Aortic aneurysm NOS (ICD10-I71.9). Aortic Atherosclerosis (ICD10-I70.0).  5. Stable probable scar at the LEFT lung apex. 6. Numerous thoracic compression fractures.   Dg Chest Port 1 View 10/30/2017 Cardiomegaly with findings of CHF and small bilateral pleural effusions. No pneumothorax.   Left Heart Cath- Neoma Laming MD [facility] 10/29/2017  Ost Cx to Prox Cx lesion is 80% stenosed with 90% stenosed side branch in Ost 1st Mrg to 1st Mrg.  Mid LAD lesion is 95% stenosed.  Mid RCA lesion is 75% stenosed.  Ost LAD to Prox LAD lesion is 70% stenosed. Severe 3 vessel disease with  severe LV systolic dysfunction and anteroapical aneurysm. Advise CABG at [facility].     ASSESSMENT: [name] is a 84 y.o. year old female here with right cerebellar parenchymal hemorrhage with IVH on 10/31/17 secondary to hypertensive plus DAPT. Vascular risk factors include HTN, DM, CAD, decreased EF and CHF.     PLAN: -Continue aspirin 81 mg daily  and start Plavix 75 mg daily as she is doing well from  a neurological standpoint with resolving deficits  -continue pravastatin for secondary stroke prevention -F/u with PCP regarding your HLD, HTN and DM management -f/u with cardiologist as scheduled -Continue home PT/OT/ST -advised needs if additional orders are needed to contact us -continue to monitor BP at home -Maintain strict control of hypertension with blood pressure goal below 130/90, diabetes with hemoglobin A1c goal below 6.5% and cholesterol with LDL cholesterol (bad cholesterol) goal below 70 mg/dL. I also advised the patient to eat a healthy diet with plenty of whole grains, cereals, fruits and vegetables, exercise regularly and maintain ideal body weight.  Follow up in 3 months or call earlier if needed   Greater than 50% of time during this 25 minute visit was spent on counseling,explanation of diagnosis of ICH, reviewing risk factor management of HLD, HTN and DM, planning of further management, discussion with patient and family and coordination of care    Venancio Poisson, [facility]  [facility] [address] [place], [place] [address]  Phone [phone] Fax [phone]

## 2017-12-17 NOTE — Patient Instructions (Signed)
Continue aspirin 81 mg daily  and start plavix and continue pravastatin  for secondary stroke prevention  Continue to follow up with PCP regarding cholesterol, blood pressure and diabetes management   Continue to follow up with cardiologist as scheduled  Continue home therapies - if additional orders are needed, please contact us  Continue to monitor blood pressure at home  Maintain strict control of hypertension with blood pressure goal below 130/90, diabetes with hemoglobin A1c goal below 6.5% and cholesterol with LDL cholesterol (bad cholesterol) goal below 70 mg/dL. I also advised the patient to eat a healthy diet with plenty of whole grains, cereals, fruits and vegetables, exercise regularly and maintain ideal body weight.  Followup in the future with me in 3 months or call earlier if needed       Thank you for coming to see [place] at [facility]. I hope we have been able to provide you high quality care today.  You may receive a patient satisfaction survey over the next few weeks. We would appreciate your feedback and comments so that we may continue to improve ourselves and the health of our patients.

## 2017-12-20 ENCOUNTER — Ambulatory Visit: Payer: Medicare Other | Admitting: Hematology and Oncology

## 2017-12-20 ENCOUNTER — Other Ambulatory Visit: Payer: Medicare Other

## 2017-12-24 ENCOUNTER — Encounter: Payer: Self-pay | Admitting: Hematology and Oncology

## 2017-12-24 ENCOUNTER — Inpatient Hospital Stay (HOSPITAL_BASED_OUTPATIENT_CLINIC_OR_DEPARTMENT_OTHER): Payer: Medicare Other | Admitting: Hematology and Oncology

## 2017-12-24 ENCOUNTER — Inpatient Hospital Stay: Payer: Medicare Other | Attending: Hematology and Oncology

## 2017-12-24 VITALS — BP 111/69 | HR 66 | Temp 97.5°F | Wt 113.0 lb

## 2017-12-24 DIAGNOSIS — D45 Polycythemia vera: Secondary | ICD-10-CM | POA: Insufficient documentation

## 2017-12-24 DIAGNOSIS — Z87891 Personal history of nicotine dependence: Secondary | ICD-10-CM | POA: Insufficient documentation

## 2017-12-24 LAB — COMPREHENSIVE METABOLIC PANEL
ALT: 20 U/L (ref 0–44)
AST: 23 U/L (ref 15–41)
Albumin: 3.7 g/dL (ref 3.5–5.0)
Alkaline Phosphatase: 31 U/L — ABNORMAL LOW (ref 38–126)
Anion gap: 9 (ref 5–15)
BUN: 19 mg/dL (ref 8–23)
CO2: 24 mmol/L (ref 22–32)
Calcium: 9 mg/dL (ref 8.9–10.3)
Chloride: 100 mmol/L (ref 98–111)
Creatinine, Ser: 1.53 mg/dL — ABNORMAL HIGH (ref 0.44–1.00)
GFR calc Af Amer: 35 mL/min — ABNORMAL LOW (ref >60)
GFR calc non Af Amer: 30 mL/min — ABNORMAL LOW (ref >60)
Glucose, Bld: 239 mg/dL — ABNORMAL HIGH (ref 70–99)
Potassium: 4.7 mmol/L (ref 3.5–5.1)
Sodium: 133 mmol/L — ABNORMAL LOW (ref 135–145)
Total Bilirubin: 0.7 mg/dL (ref 0.3–1.2)
Total Protein: 6.3 g/dL — ABNORMAL LOW (ref 6.5–8.1)

## 2017-12-24 LAB — CBC WITH DIFFERENTIAL/PLATELET
Basophils Absolute: 0.1 10*3/uL (ref 0–0.1)
Basophils Relative: 1 %
Eosinophils Absolute: 0.1 10*3/uL (ref 0–0.7)
Eosinophils Relative: 2 %
HCT: 39.7 % (ref 35.0–47.0)
Hemoglobin: 12.8 g/dL (ref 12.0–16.0)
Lymphocytes Relative: 9 %
Lymphs Abs: 0.9 10*3/uL — ABNORMAL LOW (ref 1.0–3.6)
MCH: 27.6 pg (ref 26.0–34.0)
MCHC: 32.2 g/dL (ref 32.0–36.0)
MCV: 85.8 fL (ref 80.0–100.0)
Monocytes Absolute: 0.6 10*3/uL (ref 0.2–0.9)
Monocytes Relative: 6 %
Neutro Abs: 7.9 10*3/uL — ABNORMAL HIGH (ref 1.4–6.5)
Neutrophils Relative %: 82 %
Platelets: 433 10*3/uL (ref 150–440)
RBC: 4.62 MIL/uL (ref 3.80–5.20)
RDW: 17.6 % — ABNORMAL HIGH (ref 11.5–14.5)
WBC: 9.7 10*3/uL (ref 3.6–11.0)

## 2017-12-24 NOTE — Progress Notes (Signed)
[facility] day:  12/24/2017   Chief Complaint: [name] is a 84 y.o. 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  HPI:  The patient was last seen in the medical oncology clinic on 09/20/2017.  At that time,  she was doing well.  Exam was stable.   Lower leg edema had improved with compression stockings.  Platelet count was 335,000. Creatinine had improved to 1.12 (previously 1.22).  She continued hydroxyurea 6 pills/week.  She was admitted to [facility] on 10/29/2017 then transferred to [facility].  She presented to [facility] hospital with black emesis and unresponsive secondary to hypoxia.  CTA chest revealed pericardial effusion and she was treated for sepsis felt secondary to pneumonia.  She was noted to have had a NSTEMI and underwent cardiac cath which showed three-vessel disease and severe LV dysfunction.  CABG recommended, but felt to be too high risk.  PCI stenting once CHF stable.  Post cardiac cath, CT of head showed a RIGHT cerebellar parenchymal hemorrhage.  She was transferred to [facility].  Hemorrhage was felt to be secondary to hypertensive etiology on dual antiplatelet therapy.  She was at risk for cerebral edema and treated with 3%.  There was no neurosurgery intervention that would benefit her.  She continued to progress though still had issues with pulmonary edema and CHF.  Patient managed in the ICU with cardiology help, diuresed.  Developed leukocytosis, etiology unknown, now resolving.  Once stable, was transferred to the inpatient rehab for ongoing therapy.  Plans are for PCI with stent in the near future.  Cardiology was to follow-up in 2 weeks. Aspirin was resumed at time of discharge from inpatient (7 days) and resume Plavix in 3 weeks.  Imaging had revealed:  CT head -RIGHT cerebellar parenchymal hemorrhage approximately 31 x 16 x 19  mm extension of hemorrhage into the fourth ventricle, stable on a follow up [facility] 05/122019 was stable ICH/IVH no Hydrocephalus.  MRI head- stable R cerebellar hmg, no hydro, Small vessel disease. Repeat CT head 11/03/2017 was stable.  Echo on 10/29/2017 revealed an EF 25%  She was admitted for comprehensive rehabilitation at [facility] x 2 weeks.  She was discharged on 11/16/2017.  Last CBC on 11/09/2017 revealed a hematocrit of 40.0, hemoglobin 12.2, MCV 87.5, platelets 589,000, and WBC 16,000.  She was off hydroxyurea < 1 week.  Her niece states cardiology plans to place a stent on 01/13/2018.  She will have an echo 1 week before.  She has been back on her Plavix x 1 week.  She needs assistance with her ADLs.  She cannot dress or shower on her own.  She needs helps in the bathroom.  She eats on her own.  She has full time help (24/7) at home.  Symptomatically, she denies any chest pressure or shortness of breath.  Overall, she feels "alright".  She is receiving physical therapy and speech therapy.  She has some slight weakness on her right side (dexxterity and adduction).  She has balance issues.   Past Medical History:  Diagnosis Date  . Adenocarcinoma in situ of cervix   . Arthritis    hands  . Breast cancer ([place])   . Colon cancer ([place])   . Diabetes mellitus without complication ([place])   . Endometrial carcinoma (H[facility])    s/p total abdominal hysterectomy  . H/O compression  fracture of spine 2014   thoracic spine  . H/O polycythemia vera   . H/O TIA (transient ischemic attack) and stroke 09/2014, 03/2015   No deficits  . Hypertension   . Hyperuricemia   . Microalbuminuria   . Polycythemia vera ([place])   . Recurrent falls   . Skin cancer    face, legs  . Stroke [facility]) 2008   no deficits  . Trochanteric bursitis   . Varicose veins    treated    Past Surgical History:  Procedure Laterality Date  . ABDOMINAL HYSTERECTOMY    . BOWEL RESECTION N/A 03/28/2015   Procedure: SMALL  BOWEL RESECTION;  Surgeon: Leonie Green, MD;  Location: [facility] ORS;  Service: General;  Laterality: N/A;  . CATARACT EXTRACTION W/ INTRAOCULAR LENS IMPLANT Right   . CATARACT EXTRACTION W/PHACO Left 10/07/2015   Procedure: CATARACT EXTRACTION PHACO AND INTRAOCULAR LENS PLACEMENT (IOC);  Surgeon: Ronnell Freshwater, MD;  Location: [place];  Service: Ophthalmology;  Laterality: Left;  DIABETIC - oral meds VISION BLUE  . CORONARY ANGIOGRAPHY N/A 10/29/2017   Procedure: CORONARY ANGIOGRAPHY;  Surgeon: Dionisio David, MD;  Location: [place] CV LAB;  Service: Cardiovascular;  Laterality: N/A;  . EXPLORATORY LAPAROTOMY     for fibroids  . LEFT HEART CATH Right 10/29/2017   Procedure: Left Heart Cath;  Surgeon: Dionisio David, MD;  Location: [place] CV LAB;  Service: Cardiovascular;  Laterality: Right;  . TONSILLECTOMY      Family History  Problem Relation Age of Onset  . Cancer Brother        AML  . Heart attack Mother   . Breast cancer Mother   . Heart attack Father   . Heart attack Sister   . Diabetes Sister   Her brother died of AML.  Social History:  reports that she has quit smoking. She has never used smokeless tobacco. She reports that she does not drink alcohol or use drugs.  The patient's husband died of AML.  She has no family in [place]. 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 The patient is accompanied by her niece, [name], who has medical power of attorney, today.  Allergies:  Allergies  Allergen Reactions  . Simvastatin     Other reaction(s): Muscle Pain    Current Medications: Current Outpatient Medications  Medication Sig Dispense Refill  . amLODipine (NORVASC) 2.5 MG tablet Take 1 tablet (2.5 mg total) by mouth daily. 30 tablet 0  . aspirin 81 MG chewable tablet Chew 1 tablet (81 mg total) by mouth daily.    . blood glucose meter kit and supplies Dispense based on patient and  insurance preference. Use up to four times daily as directed. (FOR ICD-10 E10.9, E11.9). 1 each 0  . calcium-vitamin D (OSCAL WITH D) 500-200 MG-UNIT per tablet Take 1 tablet by mouth daily.     . carvedilol (COREG) 25 MG tablet Take 1 tablet (25 mg total) by mouth 2 (two) times daily with a meal. 60 tablet 0  . clopidogrel (PLAVIX) 75 MG tablet Take 1 tablet (75 mg total) by mouth daily. 90 tablet 4  . docusate sodium (COLACE) 250 MG capsule Take 250 mg by mouth daily as needed for constipation.    . fexofenadine (ALLEGRA ALLERGY) 180 MG tablet Take 1 tablet (180 mg total) by mouth daily.    . hydroxyurea (HYDREA) 500 MG capsule Take 1 tablet daily except  on Saturdays 30 capsule 0  . insulin detemir (LEVEMIR) 100 unit/ml SOLN Inject 0.08 mLs (8 Units total) into the skin at bedtime. (Patient taking differently: Inject 9 Units into the skin at bedtime. ) 8 mL 1  . methylphenidate (RITALIN) 5 MG tablet Take 1 tablet (5 mg total) by mouth 2 (two) times daily with breakfast and lunch. 60 tablet 0  . nitroGLYCERIN (NITRODUR - DOSED IN MG/24 HR) 0.2 mg/hr patch Place 1 patch (0.2 mg total) onto the skin daily. 30 patch 12  . pantoprazole (PROTONIX) 40 MG tablet Take 1 tablet (40 mg total) by mouth daily. 30 tablet 0  . pravastatin (PRAVACHOL) 20 MG tablet Take 1 tablet (20 mg total) by mouth at bedtime. 30 tablet 0  . ramipril (ALTACE) 10 MG capsule Take 1 capsule (10 mg total) by mouth 2 (two) times daily. 60 capsule 0  . sertraline (ZOLOFT) 25 MG tablet Take 25 mg by mouth daily.    Marland Kitchen spironolactone (ALDACTONE) 25 MG tablet Take 1 tablet (25 mg total) by mouth daily. 30 tablet 0  . Probiotic Product (PROBIOTIC DAILY PO) Take 1 tablet by mouth daily.     No current facility-administered medications for this visit.     Review of Systems:  GENERAL: Feels "better and better".  No fevers or sweats.  Weight down 1 pound. PERFORMANCE STATUS (ECOG): 1 HEENT: No visual changes, runny nose, sore throat,  mouth sores or tenderness. Lungs: No shortness of breath or cough. No hemoptysis. Cardiac: No chest pain, palpitations, orthopnea, or PND. GI: No nausea, vomiting, diarrhea, melena or hematochezia. GU: No urgency, frequency, dysuria, or hematuria.  Bladder leakage. Musculoskeletal: No back pain. Bilateral knee pain (R>L). No muscle tenderness. Extremities: Baker's cyst.  Lower extremity edema; improved. No pain. Skin: h/o shingles.  Bruising.  No ulcers or lesions. Neuro: No headache, numbness or weakness, balance or coordination issues. Endocrine: Diabetes.  No thyroid issues, hot flashes or night sweats. Psych: No mood changes, depression or anxiety. Pain: No focal pain.  Review of systems: All other systems reviewed and found to be negative.  GENERAL:  Feels "alright".  No fevers, sweats.  Weight down 18 pounds (improving). PERFORMANCE STATUS (ECOG):  3 HEENT:  No visual changes, runny nose, sore throat, mouth sores or tenderness. Lungs: No shortness of breath or cough.  No hemoptysis. Cardiac:  No chest pain, palpitations, orthopnea, or PND.  Recent cardiac stent.  CHF:  EF 25%. GI:  No nausea, vomiting, diarrhea, constipation, melena or hematochezia. GU:  No urgency, frequency, dysuria, or hematuria. Musculoskeletal:  No back pain.  No joint pain.  No muscle tenderness. Extremities:  No pain or swelling. Skin:  No rashes or skin changes. Neuro:  Generalized weakness.  Some right sided weakness (dexterity).  Balance issues.  No headache, numbness or weakness. Endocrine:  Diabetes.  No thyroid issues, hot flashes or night sweats. Psych:  No mood changes, depression or anxiety. Pain:  No focal pain. Review of systems:  All other systems reviewed and found to be negative.   Physical Exam:  Blood pressure 111/69, pulse 66, temperature (!) 97.5 F (36.4 C), temperature source Tympanic, weight 113 lb (51.3 kg).  GENERAL:  Thin slightly fatigued appearing elderly woman  sitting comfortably in the exam room in no acute distress.  She has a rolling walker at her side. MENTAL STATUS:  Alert and oriented to person, place and time. HEAD:  Short styled blonde hair.  Normocephalic, atraumatic, face symmetric, no Cushingoid features.  EYES:  Glasses.  Blue eyes s/p cataract surgery.  Pupils equal round and reactive to light and accomodation.  No conjunctivitis or scleral icterus. ENT:  Oropharynx clear without lesion.  Tongue normal. Mucous membranes moist.  RESPIRATORY:  Clear to auscultation without rales, wheezes or rhonchi. CARDIOVASCULAR:  Regular rate and rhythm without murmur, rub or gallop. ABDOMEN:  Soft, non-tender, with active bowel sounds, and no hepatosplenomegaly.  No masses. SKIN:  No rashes, ulcers or lesions. EXTREMITIES: No edema, no skin discoloration or tenderness.  No palpable cords. LYMPH NODES: No palpable cervical, supraclavicular, axillary or inguinal adenopathy  NEUROLOGICAL: Slightly slow.  Speech minimally affected. PSYCH:  Appropriate.    Appointment on 12/24/2017  Component Date Value Ref Range Status  . Sodium 12/24/2017 133* 135 - 145 mmol/L Final  . Potassium 12/24/2017 4.7  3.5 - 5.1 mmol/L Final  . Chloride 12/24/2017 100  98 - 111 mmol/L Final   Please note change in reference range.  . CO2 12/24/2017 24  22 - 32 mmol/L Final  . Glucose, Bld 12/24/2017 239* 70 - 99 mg/dL Final   Please note change in reference range.  . BUN 12/24/2017 19  8 - 23 mg/dL Final   Please note change in reference range.  . Creatinine, Ser 12/24/2017 1.53* 0.44 - 1.00 mg/dL Final  . Calcium 12/24/2017 9.0  8.9 - 10.3 mg/dL Final  . Total Protein 12/24/2017 6.3* 6.5 - 8.1 g/dL Final  . Albumin 12/24/2017 3.7  3.5 - 5.0 g/dL Final  . AST 12/24/2017 23  15 - 41 U/L Final  . ALT 12/24/2017 20  0 - 44 U/L Final   Please note change in reference range.  . Alkaline Phosphatase 12/24/2017 31* 38 - 126 U/L Final  . Total Bilirubin 12/24/2017 0.7  0.3 -  1.2 mg/dL Final  . GFR calc non Af Amer 12/24/2017 30* >60 mL/min Final  . GFR calc Af Amer 12/24/2017 35* >60 mL/min Final   Comment: (NOTE) The eGFR has been calculated using the CKD EPI equation. This calculation has not been validated in all clinical situations. eGFR's persistently <60 mL/min signify possible Chronic Kidney Disease.   Georgiann Hahn gap 12/24/2017 9  5 - 15 Final   Performed at [facility], [address] [place], [place] [address]  . WBC 12/24/2017 9.7  3.6 - 11.0 K/uL Final  . RBC 12/24/2017 4.62  3.80 - 5.20 MIL/uL Final  . Hemoglobin 12/24/2017 12.8  12.0 - 16.0 g/dL Final  . HCT 12/24/2017 39.7  35.0 - 47.0 % Final  . MCV 12/24/2017 85.8  80.0 - 100.0 fL Final  . MCH 12/24/2017 27.6  26.0 - 34.0 pg Final  . MCHC 12/24/2017 32.2  32.0 - 36.0 g/dL Final  . RDW 12/24/2017 17.6* 11.5 - 14.5 % Final  . Platelets 12/24/2017 433  150 - 440 K/uL Final  . Neutrophils Relative % 12/24/2017 82  % Final  . Neutro Abs 12/24/2017 7.9* 1.4 - 6.5 K/uL Final  . Lymphocytes Relative 12/24/2017 9  % Final  . Lymphs Abs 12/24/2017 0.9* 1.0 - 3.6 K/uL Final  . Monocytes Relative 12/24/2017 6  % Final  . Monocytes Absolute 12/24/2017 0.6  0.2 - 0.9 K/uL Final  . Eosinophils Relative 12/24/2017 2  % Final  . Eosinophils Absolute 12/24/2017 0.1  0 - 0.7 K/uL Final  . Basophils Relative 12/24/2017 1  % Final  . Basophils Absolute 12/24/2017 0.1  0 - 0.1 K/uL Final   Performed  at [facility], [address], [place], [place] [address]     Assessment:  [name] is a 84 y.o. female with JAK2+ polycythemia rubra vera (PV) diagnosed in 2008. She has undergone low phlebotomies x 2-3 to maintain a target hematocrit is 40-42.5.   She is on hydroxyurea 500 mg (1 pill) on Mondays, Wednesdays, Fridays and Saturdays (5 pills/week). Screening for von Willebrand and a platelet function was normal. She is on a baby aspirin. She has been on Plavix since her TIA/CVA.   She has  a history of iron deficiency anemia status post GI evaluation. Colonoscopy in 07/2007 was negative. For persistent recurrent iron deficiency anemia, she underwent EGD and colonoscopy in 12/2011 and 09/11/2013.  She has a history of carcinoid tumor after presenting with a partial small bowel obstruction.  Abdominal and pelvic CT scan on 03/22/2015 revealed a 3 x 4.9 cm apple core lesion involving a loop of small bowel in the right mid abdomen. There was associated 4 x 1.8 cm adjacent mesenteric implant. Ileocolectomy on 03/28/2015 revealed a grade I neuroendocrine tumor (carcinoid) involving the terminal ileum and right colon.  Largest tumor was 2.5 cm. There were multiple adjacent nodules of a neuroendocrine tumor. Metastasis was in 9 of 12 lymph nodes.  Pathologic stage was T3N1.  Chromogranin was 4 on 05/07/2015, 3 on 04/13/2016, and 4 on 11/09/2016.   24 hour urine for 5HIAA was 5.2 mg/24 hours (0-14.9).  Octreotide scan on 05/31/2015 revealed no evidence of metastatic disease.  There was an indeterminate 9 mm nodule in the left apex.    Chest CT on 06/11/2015 revealed an 8 mm nodular density (spiculated on axial images) in the left apex. Chest CT on 10/10/2015 revealed a stable 8 mm pulmonary nodule in the anterior left lung apex.  Chest, abdomen and pelvic CT scan on 04/20/2016 revealed no definite evidence of recurrent or metastatic carcinoid tumor. There are no acute findings. There was a stable 8 mm pulmonary nodule in the left apex recommendations (follow-up CT in 12 months). 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 Recommendation was continued attention on follow-up or follow-up with abdominal MRI with and without contrast in 2 years.  She had shingles involving the right S1/S2 dermatome in 05/2017.    [facility] to [facility] then [facility] from 10/29/2017 - 11/16/2017.  She had a NSTEMI and underwent  cardiac cath which showed three-vessel disease and severe LV dysfunction.  EF was 25%.  CABG was felt high risk.  Post cardiac cath, head CT showed a RIGHT cerebellar parenchymal hemorrhage felt to be secondary to hypertensive etiology on dual antiplatelet therapy. She is scheduled for PCI with stent placement on 01/13/2018.  She is on aspirin and Plavix.  Symptomatically, she is slowly recovering.  She is out of rehab and receiving around the clock care in her home as well as physical therapy and speech therapy.  Hematocrit is 39.7, hemoglobin 12.8, and platelets 433,000.  Hydroxyurea was held for < 1 week.  Plan: 1.  Labs today:  CBC with diff, CMP.  2.  Discuss counts.  Hemoglobin good without need for phlebotomy.  Platelet count above 400,000 (goal).  Discuss missed doses and potential elevation secondary to an acute phase reaction.  Discuss plan to check counts in 2 weeks.  If improving, continue current dose.  If increasing, increase hydroxyurea to 5 pills/week.  Patient and niece in agreement.  3.  Continue hydroxyurea 5 pills/week. 4.  RTC in 01/05/2018 for labs (CBC with diff, BMP, PT/INR).  Additional labs and slight change in date per cardiology request. 5.  RTC in 1 month for MD assessment and labs (CBC with diff, CMP).   Lequita Asal, MD  12/24/2017, 3:24 PM

## 2017-12-27 NOTE — Progress Notes (Signed)
I agree with the above plan 

## 2017-12-29 NOTE — Telephone Encounter (Signed)
Completed TC call

## 2017-12-31 ENCOUNTER — Encounter: Payer: Self-pay | Admitting: Physical Medicine & Rehabilitation

## 2017-12-31 ENCOUNTER — Ambulatory Visit: Payer: Medicare Other | Admitting: Physical Medicine & Rehabilitation

## 2017-12-31 ENCOUNTER — Encounter: Payer: Medicare Other | Attending: Physical Medicine & Rehabilitation

## 2017-12-31 VITALS — BP 123/71 | HR 64 | Ht 62.0 in | Wt 111.0 lb

## 2017-12-31 DIAGNOSIS — Z8249 Family history of ischemic heart disease and other diseases of the circulatory system: Secondary | ICD-10-CM | POA: Diagnosis not present

## 2017-12-31 DIAGNOSIS — E785 Hyperlipidemia, unspecified: Secondary | ICD-10-CM | POA: Diagnosis not present

## 2017-12-31 DIAGNOSIS — I614 Nontraumatic intracerebral hemorrhage in cerebellum: Secondary | ICD-10-CM | POA: Diagnosis not present

## 2017-12-31 DIAGNOSIS — I110 Hypertensive heart disease with heart failure: Secondary | ICD-10-CM | POA: Insufficient documentation

## 2017-12-31 DIAGNOSIS — Z87891 Personal history of nicotine dependence: Secondary | ICD-10-CM | POA: Insufficient documentation

## 2017-12-31 DIAGNOSIS — E119 Type 2 diabetes mellitus without complications: Secondary | ICD-10-CM | POA: Diagnosis not present

## 2017-12-31 DIAGNOSIS — I619 Nontraumatic intracerebral hemorrhage, unspecified: Secondary | ICD-10-CM | POA: Insufficient documentation

## 2017-12-31 DIAGNOSIS — Z853 Personal history of malignant neoplasm of breast: Secondary | ICD-10-CM | POA: Insufficient documentation

## 2017-12-31 DIAGNOSIS — I69319 Unspecified symptoms and signs involving cognitive functions following cerebral infarction: Secondary | ICD-10-CM

## 2017-12-31 DIAGNOSIS — Z807 Family history of other malignant neoplasms of lymphoid, hematopoietic and related tissues: Secondary | ICD-10-CM | POA: Insufficient documentation

## 2017-12-31 DIAGNOSIS — I5021 Acute systolic (congestive) heart failure: Secondary | ICD-10-CM | POA: Insufficient documentation

## 2017-12-31 DIAGNOSIS — Z833 Family history of diabetes mellitus: Secondary | ICD-10-CM | POA: Insufficient documentation

## 2017-12-31 DIAGNOSIS — Z9889 Other specified postprocedural states: Secondary | ICD-10-CM | POA: Diagnosis not present

## 2017-12-31 DIAGNOSIS — I69398 Other sequelae of cerebral infarction: Secondary | ICD-10-CM | POA: Diagnosis not present

## 2017-12-31 DIAGNOSIS — Z09 Encounter for follow-up examination after completed treatment for conditions other than malignant neoplasm: Secondary | ICD-10-CM | POA: Insufficient documentation

## 2017-12-31 DIAGNOSIS — Z8542 Personal history of malignant neoplasm of other parts of uterus: Secondary | ICD-10-CM | POA: Insufficient documentation

## 2017-12-31 DIAGNOSIS — Z9089 Acquired absence of other organs: Secondary | ICD-10-CM | POA: Diagnosis not present

## 2017-12-31 DIAGNOSIS — Z79899 Other long term (current) drug therapy: Secondary | ICD-10-CM

## 2017-12-31 DIAGNOSIS — R269 Unspecified abnormalities of gait and mobility: Secondary | ICD-10-CM | POA: Diagnosis not present

## 2017-12-31 DIAGNOSIS — I69393 Ataxia following cerebral infarction: Secondary | ICD-10-CM | POA: Diagnosis not present

## 2017-12-31 DIAGNOSIS — Z8541 Personal history of malignant neoplasm of cervix uteri: Secondary | ICD-10-CM | POA: Insufficient documentation

## 2017-12-31 DIAGNOSIS — Z8673 Personal history of transient ischemic attack (TIA), and cerebral infarction without residual deficits: Secondary | ICD-10-CM | POA: Insufficient documentation

## 2017-12-31 DIAGNOSIS — Z85828 Personal history of other malignant neoplasm of skin: Secondary | ICD-10-CM | POA: Insufficient documentation

## 2017-12-31 DIAGNOSIS — Z85038 Personal history of other malignant neoplasm of large intestine: Secondary | ICD-10-CM | POA: Insufficient documentation

## 2017-12-31 NOTE — Progress Notes (Signed)
Subjective:    Patient ID: [name], female    DOB: 1932-04-05, 84 y.o.   MRN: [id number]  HPI  84 year old female with adenocarcinoma of the cervix as well as endometrial carcinoma and polycythemia vera as well as diabetes who had hypertensive right cerebellar hemorrhage onset 10/28/2017.  She was treated initially at [facility] and then transferred to [facility].  Neurosurgical evaluation per Dr. Ronnald Ramp concluded no surgery was needed.  The patient completed inpatient rehabilitation at [facility] and was discharged at a 24-hour supervision level.  The patient has hired caregiver to provide 24-hour supervision.  Her niece is here with her today. She follows up medically with Dr. Harrel Lemon.  She has followed up with [facility] neurology as well. 2 falls since hospital d/c, one fall was within 2 wks of d/c and the other was a couple weeks ago.  HHPT, OT, will be finishing up this week, SLP is continuing  Plans for cardiac stenting on 7/25, Dr Terrence Dupont, will be hospitalized overnite and have restrictions for several days post procedure  Patient still notes some difficulty with writing with the right hand poor coordination on a side  Pain Inventory Average Pain 0 Pain Right Now 0 My pain is na  In the last 24 hours, has pain interfered with the following? General activity 0 Relation with others 0 Enjoyment of life 0 What TIME of day is your pain at its worst? na Sleep (in general) na  Pain is worse with: na Pain improves with: na Relief from Meds: na  Mobility walk with assistance ability to climb steps?  yes do you drive?  no  Function retired I need assistance with the following:  dressing, bathing, toileting, meal prep, household duties and shopping  Neuro/Psych bowel control problems weakness trouble walking dizziness  Prior Studies Any changes since last visit?  no  Physicians involved in your care Any changes since last visit?   no   Family History  Problem Relation Age of Onset  . Cancer Brother        AML  . Heart attack Mother   . Breast cancer Mother   . Heart attack Father   . Heart attack Sister   . Diabetes Sister    Social History   Socioeconomic History  . Marital status: Widowed    Spouse name: Not on file  . Number of children: Not on file  . Years of education: Not on file  . Highest education level: Not on file  Occupational History  . Not on file  Social Needs  . Financial resource strain: Not on file  . Food insecurity:    Worry: Not on file    Inability: Not on file  . Transportation needs:    Medical: Not on file    Non-medical: Not on file  Tobacco Use  . Smoking status: Former Research scientist (life sciences)  . Smokeless tobacco: Never Used  . Tobacco comment: quit 30+ yrs ago  Substance and Sexual Activity  . Alcohol use: No  . Drug use: No  . Sexual activity: Not on file  Lifestyle  . Physical activity:    Days per week: Not on file    Minutes per session: Not on file  . Stress: Not on file  Relationships  . Social connections:    Talks on phone: Not on file    Gets together: Not on file    Attends religious service: Not on file    Active member  of club or organization: Not on file    Attends meetings of clubs or organizations: Not on file    Relationship status: Not on file  Other Topics Concern  . Not on file  Social History Narrative  . Not on file   Past Surgical History:  Procedure Laterality Date  . ABDOMINAL HYSTERECTOMY    . BOWEL RESECTION N/A 03/28/2015   Procedure: SMALL BOWEL RESECTION;  Surgeon: Leonie Green, MD;  Location: [facility] ORS;  Service: General;  Laterality: N/A;  . CATARACT EXTRACTION W/ INTRAOCULAR LENS IMPLANT Right   . CATARACT EXTRACTION W/PHACO Left 10/07/2015   Procedure: CATARACT EXTRACTION PHACO AND INTRAOCULAR LENS PLACEMENT (IOC);  Surgeon: Ronnell Freshwater, MD;  Location: [place];  Service: Ophthalmology;  Laterality: Left;   DIABETIC - oral meds VISION BLUE  . CORONARY ANGIOGRAPHY N/A 10/29/2017   Procedure: CORONARY ANGIOGRAPHY;  Surgeon: Dionisio David, MD;  Location: [place] CV LAB;  Service: Cardiovascular;  Laterality: N/A;  . EXPLORATORY LAPAROTOMY     for fibroids  . LEFT HEART CATH Right 10/29/2017   Procedure: Left Heart Cath;  Surgeon: Dionisio David, MD;  Location: [place] CV LAB;  Service: Cardiovascular;  Laterality: Right;  . TONSILLECTOMY     Past Medical History:  Diagnosis Date  . Adenocarcinoma in situ of cervix   . Arthritis    hands  . Breast cancer ([place])   . Colon cancer ([place])   . Diabetes mellitus without complication ([place])   . Endometrial carcinoma (HCC)    s/p total abdominal hysterectomy  . H/O compression fracture of spine 2014   thoracic spine  . H/O polycythemia vera   . H/O TIA (transient ischemic attack) and stroke 09/2014, 03/2015   No deficits  . Hypertension   . Hyperuricemia   . Microalbuminuria   . Polycythemia vera ([place])   . Recurrent falls   . Skin cancer    face, legs  . Stroke [facility]) 2008   no deficits  . Trochanteric bursitis   . Varicose veins    treated   There were no vitals taken for this visit.  Opioid Risk Score:   Fall Risk Score:  `1  Depression screen PHQ 2/9  Depression screen PHQ 2/9 11/26/2017  Decreased Interest 1  Down, Depressed, Hopeless 1  PHQ - 2 Score 2  Altered sleeping 1  Tired, decreased energy 3  Change in appetite 0  Feeling bad or failure about yourself  0  Trouble concentrating 3  Moving slowly or fidgety/restless 3  Suicidal thoughts 1  PHQ-9 Score 13     Review of Systems  Constitutional: Positive for unexpected weight change.  HENT: Negative.   Eyes: Negative.   Respiratory: Negative.   Cardiovascular: Negative.   Gastrointestinal: Positive for constipation.  Endocrine: Negative.   Genitourinary: Negative.   Musculoskeletal: Negative.   Skin: Negative.   Allergic/Immunologic: Negative.     Neurological: Negative.   Hematological: Negative.   Psychiatric/Behavioral: Negative.   All other systems reviewed and are negative.      Objective:   Physical Exam  Constitutional: She is oriented to person, place, and time. She appears well-developed and well-nourished. No distress.  HENT:  Head: Normocephalic and atraumatic.  Eyes: Pupils are equal, round, and reactive to light. EOM are normal.  Neck: Normal range of motion.  Cardiovascular: Normal rate, regular rhythm and normal heart sounds. Exam reveals no friction rub.  No murmur heard. Pulmonary/Chest: Effort normal and breath  sounds normal. No stridor. No respiratory distress.  Abdominal: Soft. Bowel sounds are normal. She exhibits no distension. There is no tenderness.  Musculoskeletal:  No pains in the upper or lower limbs with range of motion.  Neurological: She is alert and oriented to person, place, and time. She displays no atrophy. No cranial nerve deficit or sensory deficit. She exhibits normal muscle tone. Coordination and gait abnormal.  Mild dysmetria right finger-nose-finger Motor strength is 5/5 bilateral deltoid bicep tricep grip hip flexion extension ankle dorsiflexion.   Skin: She is not diaphoretic.  Psychiatric: She has a normal mood and affect. Her behavior is normal. Thought content normal. Her speech is slurred. Cognition and memory are impaired.  Speech is mildly dysarthric  Nursing note and vitals reviewed.         Assessment & Plan:  #1.  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 Overall has been making good functional gains but still requires 24/7 supervision partially for cognition in conjunction with her fall risk.  Would recommend outpatient PT OT and speech.  I have ordered this.  Given her upcoming hospitalization this should be started after her restrictions after stenting have been lifted.  This should be an early August.   She can continue home health speech therapy for now as well as home exercise program for mobility  Secondary stroke prophylaxis Dr. Leonie Man neurology follow-up, has been started on Plavix since discharge from hospital . #2.  Diabetes and hypertension follow-up with primary care physician Dr. Edwina Barth  3.  Adenocarcinoma of the cervix and endometrial carcinoma follow-up with Dr. Mike Gip from oncology  4.  Coronary artery disease follow-up with Dr. Terrence Dupont from cardiology.  Stenting is scheduled

## 2018-01-05 ENCOUNTER — Inpatient Hospital Stay: Payer: Medicare Other

## 2018-01-10 ENCOUNTER — Telehealth: Payer: Self-pay

## 2018-01-10 NOTE — Telephone Encounter (Signed)
Reviewed Dr. Letta Pate note, no mention of methylphenidate. Dr. Letta Pate would like to continue methylphenidate?  Please advised.

## 2018-01-10 NOTE — Telephone Encounter (Signed)
[facility] pharmacy called requesting refill on Methylphenidate for pt. Last filled 12/13/17

## 2018-01-11 NOTE — Telephone Encounter (Signed)
On 01/10/2018, message was sent to Dr Letta Pate regarding the methylphenidate.  methylphenidate will be discontinued, per Dr. Letta Pate.

## 2018-01-11 NOTE — Telephone Encounter (Signed)
May d/c methylphenidate

## 2018-01-12 ENCOUNTER — Telehealth: Payer: Self-pay

## 2018-01-12 NOTE — Telephone Encounter (Signed)
[name] of pt called stating pharmacy has been contacting us about refilling Methylphenidate. Message to [place] and then [employer], no message back to the clinical pool but after [name] called I looked in pt messages and Kirsteins said d/c. Called pharmacy and notified.  [name] notified.

## 2018-01-13 ENCOUNTER — Encounter (HOSPITAL_COMMUNITY): Payer: Self-pay

## 2018-01-13 ENCOUNTER — Ambulatory Visit (HOSPITAL_COMMUNITY): Admit: 2018-01-13 | Payer: Medicare Other | Admitting: Cardiology

## 2018-01-13 SURGERY — LEFT HEART CATH AND CORONARY ANGIOGRAPHY
Anesthesia: LOCAL

## 2018-01-18 ENCOUNTER — Ambulatory Visit: Payer: Medicare Other | Attending: Physical Medicine & Rehabilitation | Admitting: Speech Pathology

## 2018-01-18 ENCOUNTER — Ambulatory Visit: Payer: Medicare Other | Admitting: Occupational Therapy

## 2018-01-18 DIAGNOSIS — R471 Dysarthria and anarthria: Secondary | ICD-10-CM | POA: Diagnosis present

## 2018-01-18 DIAGNOSIS — I69123 Fluency disorder following nontraumatic intracerebral hemorrhage: Secondary | ICD-10-CM | POA: Diagnosis present

## 2018-01-18 DIAGNOSIS — R41841 Cognitive communication deficit: Secondary | ICD-10-CM | POA: Diagnosis not present

## 2018-01-19 ENCOUNTER — Other Ambulatory Visit: Payer: Self-pay

## 2018-01-19 ENCOUNTER — Encounter: Payer: Self-pay | Admitting: Speech Pathology

## 2018-01-19 NOTE — Therapy (Signed)
[place] MAIN [facility] SERVICES [address] [place], [place], [address] Phone: [phone]   Fax:  [phone]  Speech Language Pathology Evaluation  Patient Details  Name: [name] MRN: [id number] Date of Birth: 1932-01-31 Referring Provider: Charlett Blake    Encounter Date: 01/18/2018  End of Session - 01/19/18 1440    Visit Number  1    Number of Visits  17    Date for SLP Re-Evaluation  03/21/18    SLP Start Time  66    SLP Stop Time   1500    SLP Time Calculation (min)  60 min    Activity Tolerance  Patient tolerated treatment well       Past Medical History:  Diagnosis Date  . Adenocarcinoma in situ of cervix   . Arthritis    hands  . Breast cancer ([place])   . Colon cancer ([place])   . Diabetes mellitus without complication ([place])   . Endometrial carcinoma (HCC)    s/p total abdominal hysterectomy  . H/O compression fracture of spine 2014   thoracic spine  . H/O polycythemia vera   . H/O TIA (transient ischemic attack) and stroke 09/2014, 03/2015   No deficits  . Hypertension   . Hyperuricemia   . Microalbuminuria   . Polycythemia vera ([place])   . Recurrent falls   . Skin cancer    face, legs  . Stroke [facility]) 2008   no deficits  . Trochanteric bursitis   . Varicose veins    treated    Past Surgical History:  Procedure Laterality Date  . ABDOMINAL HYSTERECTOMY    . BOWEL RESECTION N/A 03/28/2015   Procedure: SMALL BOWEL RESECTION;  Surgeon: Leonie Green, MD;  Location: [facility] ORS;  Service: General;  Laterality: N/A;  . CATARACT EXTRACTION W/ INTRAOCULAR LENS IMPLANT Right   . CATARACT EXTRACTION W/PHACO Left 10/07/2015   Procedure: CATARACT EXTRACTION PHACO AND INTRAOCULAR LENS PLACEMENT (IOC);  Surgeon: Ronnell Freshwater, MD;  Location: [place];  Service: Ophthalmology;  Laterality: Left;  DIABETIC - oral meds VISION BLUE  . CORONARY ANGIOGRAPHY N/A 10/29/2017   Procedure: CORONARY  ANGIOGRAPHY;  Surgeon: Dionisio David, MD;  Location: [place] CV LAB;  Service: Cardiovascular;  Laterality: N/A;  . EXPLORATORY LAPAROTOMY     for fibroids  . LEFT HEART CATH Right 10/29/2017   Procedure: Left Heart Cath;  Surgeon: Dionisio David, MD;  Location: [place] CV LAB;  Service: Cardiovascular;  Laterality: Right;  . TONSILLECTOMY      There were no vitals filed for this visit.      SLP Evaluation [facility] - 01/19/18 0001      SLP Visit Information   SLP Received On  01/18/18    Referring Provider  Alysia Penna E     Onset Date  10/28/2017    Medical Diagnosis  Right cerebellar stroke      Subjective   Subjective  "I didn't talk like that before"    Patient/Family Stated Goal  Talk without stammering      Pain Assessment   Pain Score  0-No pain      General Information   HPI  84 year old female with adenocarcinoma of the cervix as well as endometrial carcinoma and polycythemia vera as well as diabetes who had hypertensive right cerebellar hemorrhage onset 10/28/2017.  She was treated initially at [facility] and then transferred to [facility].  Neurosurgical evaluation per  Dr. Ronnald Ramp concluded no surgery was needed.  The patient completed inpatient rehabilitation at [facility] (including SLP) 11/04/2017-11/18/2107 and was discharged at a 24-hour supervision level.  The patient has hired caregiver to provide 24-hour supervision. Patient has received home health rehab services, including SLP.      Prior Functional Status   Cognitive/Linguistic Baseline  Information not available      Cognition   Overall Cognitive Status  Impaired/Different from baseline    Area of Impairment  Memory;Awareness;Safety/judgement Executive function      Auditory Comprehension   Overall Auditory Comprehension  Impaired    Overall Auditory Comprehension Comments  Difficulty with abstract/complex information      Reading Comprehension   Reading Status  Impaired     Paragraph Level  51-75% accurate      Expression   Primary Mode of Expression  Verbal      Verbal Expression   Overall Verbal Expression  Impaired    Other Verbal Expression Comments  Difficulty with complex/abstract expression      Written Expression   Overall Writen Expression  RUE weakness      Oral Motor/Sensory Function   Overall Oral Motor/Sensory Function  Appears within functional limits for tasks assessed      Motor Speech   Overall Motor Speech  Impaired    Respiration  Within functional limits    Phonation  Normal    Resonance  Within functional limits    Articulation  Impaired    Level of Impairment  Conversation    Intelligibility  Intelligible    Motor Planning  Impaired    Level of Impairment  Science writer Errors  Unaware;Inconsistent    Phonation  WFL      Standardized Assessments   Standardized Assessments   Cognitive Linguistic Quick Test       Cognitive Linguistic Quick Test The Cognitive Linguistic Quick Test (CLQT) was administered to assess the relative status of five cognitive domains: attention, memory, language, executive functioning, and visuospatial skills. Scores from 10 tasks were used to estimate severity ratings (for age groups 18-69 years and 70-89 years) for each domain, a clock drawing task, as well as an overall composite severity rating of cognition.    Task    Score  Criterion Cut Scores Personal Facts  8/8   8  Symbol Cancellation   10/12   10 Confrontation Naming    10/10   10 Clock Drawing     13/13   11 Story Retelling       8/10   5 Symbol Trails      1/10   6 Generative Naming      2/9   4 Design Memory  3/6   4 Mazes        4/8   4 Design Generation    4/13   5  Cognitive Domain  Composite Score Severity Rating Attention   135/215  Mild  Memory   136/185  Mild Executive Function  11/40   Moderate Language   28/37   WNL Visuospatial Skills  50/105   Mild Clock Drawing   13/13   WNL  Composite  Severity Rating Mild    Additional Observations: The patient demonstrates dysfluency (sound/syllable repetition) and semantic paraphasia in conversational speech.   SLP Education - 01/19/18 1439    Education Details  Results and POC    Person(s) Educated  Patient    Methods  Explanation    Comprehension  Verbalized understanding         SLP Long Term Goals - 01/19/18 1443      SLP LONG TERM GOAL #1   Title  Patient will generate fluent, grammatical, and cogent sentence to complete simple/concrete linguistic task with 80% accuracy.    Time  4    Period  Weeks    Status  New    Target Date  02/18/18      SLP LONG TERM GOAL #2   Title  Patient will generate grammatical, fluent, and cogent sentences to complete abstract/complex linguistic task with 80% accuracy.    Time  8    Period  Weeks    Status  New    Target Date  03/21/18      SLP LONG TERM GOAL #3   Title  Patient will identify cognitive-communication barriers and participate in developing functional compensatory strategies.    Time  8    Period  Weeks    Status  New    Target Date  03/21/18      SLP LONG TERM GOAL #4   Title  Patient will complete moderately complex executive function skills tasks with 80% accuracy.    Time  8    Period  Weeks    Status  New    Target Date  03/21/18       Plan - 01/19/18 1442    Clinical Impression Statement  At 11 weeks post onset right cerebellar hemorrhage, this 84 year old woman is presenting with mild-moderate cognitive-communication deficit characterized by impairment of executive skills, attention, memory, visuospatial skills, and abstract language.  The results of the Cognitive Linguistic Quick Test (CLQT) indicate a composite severity rating of mild.  The patient scored with moderate impairment in the realm of executive function, mild impairment in the realms of attention, memory, and visuospatial skills and within normal limits for language and clock drawing.   In  addition, the patient demonstrates speech disturbance characterized by minimally imprecise articulation and moderate dysfluency.  The patient currently has 24/7 supervision and does not have personal responsibilities she must complete independently.  The patient is most concerned with her speech fluency and we will target fluent and coherent speech during therapy.  We will also help the patient to identify and manage functional impact of cognitive deficits.    Speech Therapy Frequency  2x / week    Duration  Other (comment) 8 weeks    Treatment/Interventions  Compensatory strategies;Patient/family education;Cognitive reorganization;Language facilitation;Internal/external aids    Potential to Achieve Goals  Good    Potential Considerations  Ability to learn/carryover information;Pain level;Family/community support;Co-morbidities;Previous level of function;Cooperation/participation level;Severity of impairments;Medical prognosis    SLP Home Exercise Plan  TBD    Consulted and Agree with Plan of Care  Patient       Patient will benefit from skilled therapeutic intervention in order to improve the following deficits and impairments:   Cognitive communication deficit - Plan: SLP plan of care cert/re-cert  Dysarthria - Plan: SLP plan of care cert/re-cert  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 Cognitive deficit, post-stroke 12/31/2017  . Ataxia, post-stroke 12/31/2017  . Dysarthria, post-stroke   . Gait disturbance, post-stroke   . H/O cerebral parenchymal hemorrhage 11/04/2017  . Essential hypertension 11/04/2017  . Hyperlipidemia 11/04/2017  . Diabetes ([place]) 11/04/2017  . CAD (coronary artery disease) 11/04/2017  .  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 Hypokalemia 11/04/2017  . Right-sided nontraumatic intracerebral hemorrhage of cerebellum ([place])   .  History of cervical cancer   . History of TIA (transient ischemic attack)   . Acute systolic congestive heart failure ([place])   . Reactive hypertension   . Hypernatremia   . Leukocytosis   . Acute blood loss anemia   . Elevated serum creatinine   . Acute respiratory failure with hypoxia ([place])   . IVH (intraventricular hemorrhage) ([place]) 10/29/2017  . Hypoxia 10/28/2017  . Pancreatic lesion 05/24/2017  . 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 Pulmonary nodule, left 06/04/2015  . 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 Cerebral thrombosis with cerebral infarction 04/03/2015  . Cancer of right colon ([place]) 03/28/2015  . CVA (cerebral infarction) 12/21/2014  . Polycythemia vera ([place]) 06/22/2006   Leroy Sea, MS/CCC- SLP  Lou Miner 01/19/2018, 2:52 PM  [place] MAIN [facility] SERVICES [address] [place], [place], [address] Phone: [phone]   Fax:  [phone]  Name: [name] MRN: [id number] Date of Birth: Sep 20, 1931

## 2018-01-20 ENCOUNTER — Encounter: Payer: Self-pay | Admitting: Occupational Therapy

## 2018-01-20 ENCOUNTER — Ambulatory Visit: Payer: Medicare Other | Attending: Physical Medicine & Rehabilitation | Admitting: Occupational Therapy

## 2018-01-20 ENCOUNTER — Other Ambulatory Visit: Payer: Self-pay

## 2018-01-20 DIAGNOSIS — I69123 Fluency disorder following nontraumatic intracerebral hemorrhage: Secondary | ICD-10-CM | POA: Insufficient documentation

## 2018-01-20 DIAGNOSIS — R262 Difficulty in walking, not elsewhere classified: Secondary | ICD-10-CM | POA: Insufficient documentation

## 2018-01-20 DIAGNOSIS — R41841 Cognitive communication deficit: Secondary | ICD-10-CM | POA: Insufficient documentation

## 2018-01-20 DIAGNOSIS — R471 Dysarthria and anarthria: Secondary | ICD-10-CM | POA: Insufficient documentation

## 2018-01-20 DIAGNOSIS — R278 Other lack of coordination: Secondary | ICD-10-CM | POA: Insufficient documentation

## 2018-01-20 DIAGNOSIS — M6281 Muscle weakness (generalized): Secondary | ICD-10-CM | POA: Diagnosis present

## 2018-01-22 NOTE — Therapy (Signed)
[place] MAIN [facility] SERVICES [address] [place], [place], [address] Phone: [phone]   Fax:  [phone]  Occupational Therapy Evaluation  Patient Details  Name: [name] MRN: [id number] Date of Birth: 10-Dec-1931 Referring Provider: Letta Pate   Encounter Date: 01/20/2018  OT End of Session - 01/22/18 1059    Visit Number  1    Number of Visits  24    Date for OT Re-Evaluation  04/14/18    Authorization Type  Medicare    Authorization Time Period  visit 1/10, reporting period starting 01/20/2018    OT Start Time  1100    OT Stop Time  1200    OT Time Calculation (min)  60 min    Activity Tolerance  Patient tolerated treatment well    Behavior During Therapy  [facility] for tasks assessed/performed       Past Medical History:  Diagnosis Date  . Adenocarcinoma in situ of cervix   . Arthritis    hands  . Breast cancer ([place])   . Colon cancer ([place])   . Diabetes mellitus without complication ([place])   . Endometrial carcinoma (HCC)    s/p total abdominal hysterectomy  . H/O compression fracture of spine 2014   thoracic spine  . H/O polycythemia vera   . H/O TIA (transient ischemic attack) and stroke 09/2014, 03/2015   No deficits  . Hypertension   . Hyperuricemia   . Microalbuminuria   . Polycythemia vera ([place])   . Recurrent falls   . Skin cancer    face, legs  . Stroke [facility]) 2008   no deficits  . Trochanteric bursitis   . Varicose veins    treated    Past Surgical History:  Procedure Laterality Date  . ABDOMINAL HYSTERECTOMY    . BOWEL RESECTION N/A 03/28/2015   Procedure: SMALL BOWEL RESECTION;  Surgeon: Leonie Green, MD;  Location: [facility] ORS;  Service: General;  Laterality: N/A;  . CATARACT EXTRACTION W/ INTRAOCULAR LENS IMPLANT Right   . CATARACT EXTRACTION W/PHACO Left 10/07/2015   Procedure: CATARACT EXTRACTION PHACO AND INTRAOCULAR LENS PLACEMENT (IOC);  Surgeon: Ronnell Freshwater, MD;  Location: [place];  Service: Ophthalmology;  Laterality: Left;  DIABETIC - oral meds VISION BLUE  . CORONARY ANGIOGRAPHY N/A 10/29/2017   Procedure: CORONARY ANGIOGRAPHY;  Surgeon: Dionisio David, MD;  Location: [place] CV LAB;  Service: Cardiovascular;  Laterality: N/A;  . EXPLORATORY LAPAROTOMY     for fibroids  . LEFT HEART CATH Right 10/29/2017   Procedure: Left Heart Cath;  Surgeon: Dionisio David, MD;  Location: [place] CV LAB;  Service: Cardiovascular;  Laterality: Right;  . TONSILLECTOMY      There were no vitals filed for this visit.     [facility] OT Assessment - 01/22/18 0001      Assessment   Medical Diagnosis  CVA    Referring Provider  Kirsteins    Onset Date/Surgical Date  10/28/17    Hand Dominance  Right      Balance Screen   Has the patient fallen in the past 6 months  Yes    How many times?  3       [place] expects to be discharged to:  Private residence    Living Arrangements  Other(Comment)    Available Help at Discharge  Personal care attendant    Type of [place]  Stairs    Home Layout  One level    Alternate Level Stairs - Number of Steps  2    Bathroom Shower/Tub  Medical illustrator  Handicapped height    Bathroom Accessibility  No    Home Equipment  Walker - 2 wheels;Bedside commode;Grab bars - tub/shower    Lives With  Alone;Other (Comment) 24 hour caregiver      Prior Function   Level of Independence  Independent with basic ADLs    Vocation  Retired      ADL   Eating/Feeding  Modified independent    Grooming  Modified independent    Upper Body Bathing  Moderate assistance    Lower Body Bathing  Moderate assistance    Upper Body Dressing  Moderate assistance    Lower Body Dressing  Moderate assistance    Toilet Transfer  Min guard    Toileting - Clothing Manipulation  Increased time    Toileting -  Hygiene  Increase time;Supervision/safety    Tub/Shower Transfer  Minimal  assistance    ADL comments  Patient has difficulty with handwriting, meal prep, bathing, dressing, household chores      IADL   Prior Level of Function Materials engineer for transportation    Prior Level of Function Light Housekeeping  independent    Light Housekeeping  Needs help with all home maintenance tasks    Prior Level of Function Meal Prep  independent    Meal Prep  Needs to have meals prepared and served    Prior Level of Function Community Mobility  independent driving and no assistive device    Community Mobility  Relies on family or friends for transportation    Prior Level of Function Medication Managment  independent    Medication Management  Takes responsibility if medication is prepared in advance in seperate dosage    Prior Level of Function Financial Management  independent    Financial Management  Requires assistance      Mobility   Mobility Status  History of falls;Needs assist    Mobility Status Comments  walker      Written Expression   Handwriting  25% legible      Vision - History   Baseline Vision  Wears glasses all the time      Cognition   Overall Cognitive Status  Impaired/Different from baseline    Area of Impairment  Memory;Awareness;Safety/judgement      Sensation   Light Touch  Appears Intact    Stereognosis  Appears Intact    Hot/Cold  Appears Intact    Proprioception  Appears Intact      Coordination   Gross Motor Movements are Fluid and Coordinated  Yes    Fine Motor Movements are Fluid and Coordinated  No    9 Hole Peg Test  Right;Left    Right 9 Hole Peg Test  46 secs    Left 9 Hole Peg Test  38 secs      ROM / Strength   AROM / PROM / Strength  AROM;Strength      AROM   Overall AROM   Within functional limits for tasks performed    Overall AROM Comments  RUE WFLs      Strength   Overall Strength  Deficits    Overall Strength Comments  4/5 overall      Hand Function   Right Hand Grip (lbs)  14  Right Hand Lateral Pinch  9 lbs    Right Hand 3 Point Pinch  8 lbs    Left Hand Grip (lbs)  18    Left Hand Lateral Pinch  6 lbs    Left 3 point pinch  8 lbs                      OT Education - 01/22/18 1059    Education Details  POC, goals    Person(s) Educated  Patient    Methods  Explanation    Comprehension  Verbalized understanding          OT Long Term Goals - 01/22/18 1121      OT LONG TERM GOAL #1   Title  Patient will complete bathing with modified independence    Baseline  moderate assist    Time  12    Period  Weeks    Status  New      OT LONG TERM GOAL #2   Title  Patient will complete dressing skills with modified independence    Baseline  moderate assist    Time  12    Period  Weeks    Status  New      OT LONG TERM GOAL #3   Title  Patient will complete light meal prep with min assist    Baseline  max assist    Time  12    Period  Weeks    Status  New      OT LONG TERM GOAL #4   Title  Patient will increase R grip strength by 5# to open jars and containers with modified independence    Baseline  difficulty at eval    Time  12    Period  Weeks    Status  New      OT LONG TERM GOAL #5   Title  Patient will improve strength by 1 mm grade RUE to assist with obtaining items from the closet.     Baseline  4/5 overall RUE    Time  8    Period  Weeks    Status  New            Plan - 01/22/18 1103    Clinical Impression Statement  Patient is a 84 yo female who suffered a right side nontramatic intracerebral hemorrhage in May 2019, was hospitalized and referred to OP for OT evaluation.  Patient lived alone previously and was independent in all tasks.  Since event, she is requiring 24 hour caregiver assist.  Patient demonstrates muscle weakness, lack of coordination, decreased memory, impaired balance, and difficulty with ADLs and IADLs.  She would benefit from skilled OT to maximize safety and independence with necessary daily  tasks.     Occupational Profile and client history currently impacting functional performance  family more than 30 mins away, has 24 hour caregiver assist, decreased awareness, memory and safety    Occupational performance deficits (Please refer to evaluation for details):  ADL's;IADL's;Social Participation    Rehab Potential  Good    Current Impairments/barriers affecting progress:  memory, requires 24 hour caregivers,     OT Frequency  2x / week    OT Duration  12 weeks    OT Treatment/Interventions  Self-care/ADL training;Cryotherapy;Therapeutic exercise;DME and/or AE instruction;Functional Mobility Training;Cognitive remediation/compensation;Balance training;Neuromuscular education;Manual Therapy;Moist Heat;Therapeutic activities;Patient/family education    Clinical Decision Making  Several treatment options, min-mod task modification necessary    Consulted and Agree  with Plan of Care  Patient;Family member/caregiver    Family Member Consulted  caregiver       Patient will benefit from skilled therapeutic intervention in order to improve the following deficits and impairments:  Decreased balance, Decreased mobility, Difficulty walking, Decreased cognition, Decreased activity tolerance, Decreased coordination, Decreased safety awareness, Decreased strength, Impaired UE functional use  Visit Diagnosis: Muscle weakness (generalized)  Other lack of coordination    Problem List Patient Active Problem List   Diagnosis Date Noted  . Cognitive deficit, post-stroke 12/31/2017  . Ataxia, post-stroke 12/31/2017  . Dysarthria, post-stroke   . Gait disturbance, post-stroke   . H/O cerebral parenchymal hemorrhage 11/04/2017  . Essential hypertension 11/04/2017  . Hyperlipidemia 11/04/2017  . Diabetes ([place]) 11/04/2017  . CAD (coronary artery disease) 11/04/2017  . 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 Hypokalemia 11/04/2017  . Right-sided nontraumatic intracerebral hemorrhage of  cerebellum ([place])   . History of cervical cancer   . History of TIA (transient ischemic attack)   . Acute systolic congestive heart failure ([place])   . Reactive hypertension   . Hypernatremia   . Leukocytosis   . Acute blood loss anemia   . Elevated serum creatinine   . Acute respiratory failure with hypoxia ([place])   . IVH (intraventricular hemorrhage) ([place]) 10/29/2017  . Hypoxia 10/28/2017  . Pancreatic lesion 05/24/2017  . 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 Pulmonary nodule, left 06/04/2015  . 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 Cerebral thrombosis with cerebral infarction 04/03/2015  . Cancer of right colon ([place]) 03/28/2015  . CVA (cerebral infarction) 12/21/2014  . Polycythemia vera ([place]) 06/22/2006   Hibba Schram T Tomasita Morrow, OTR/L, CLT  Reha Martinovich 01/22/2018, 11:27 AM  [place] MAIN [facility] SERVICES [address] [place], [place], [address] Phone: [phone]   Fax:  [phone]  Name: [name] MRN: [id number] Date of Birth: [birth year]

## 2018-01-23 NOTE — Progress Notes (Signed)
[facility] day:  01/24/2018  Chief Complaint: [name] is a 84 y.o. 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  HPI:  The patient was last seen in the medical oncology clinic on 12/24/2017.  At that time, patient was feeling "alright". She denies any recurrent chest pain or episodes of shortness of breath.  Patient participating in physical and speech therapy following recent NSTEMI and RIGHT cerebellar ICH. She had resulting balance issues. Exam revealed RIGHT sided weakness. Platelets 433,000. She continued on hydroxyurea 500 mg 5 days a week (5 pills/week total).   She was seen in consult on 12/31/2017 by Dr. Alysia Penna (physical and rehab medicine). Notes reviewed. Patient was noted to be making functional gains, however due to weakness and cognitive issues, 24/7 care was continued. Patient with continued dysarthria, dysmetria, and generalized weakness. She had continued coordination and gait abnormalities. Plans were to continue therapy (PT/OT/ST) at home.  Patient was scheduled for routine lab monitoring on 01/05/2018.  In the review of the patient's medical record, it was discovered that patient did not return for ordered lab studies.  Patient was scheduled for left catheterization on 01/13/2018.  Procedure was canceled, and patient was referred for a second opinion. She was seen in consult by Dr. Katrine Coho (cardiology). Notes reviewed.  Echocardiogram demonstrated ischemic congestive heart failure with an ejection fraction of 25%.  Patient with multivessel CAD.  Discussed PCI versus CABG.  Patient determined to be high risk for consideration of CABG.  She was asymptomatic during her visit with Dr. Clayborn Bigness, thus a conservative course was recommended.  Patient to follow-up with cardiology in 1 month.  In the interim, she notes some  improvement.  She is receiving outpatient speech and occupational therapy.  She notes some improvement in walking.  She requires assistance with ADLs.  She cannot dress on her own and needs some help in the bathroom.  She has a shower chair.  She denies any dizziness, chest pain or shortness of breath.  She states that home health care comes out 2x/week.     Past Medical History:  Diagnosis Date  . Adenocarcinoma in situ of cervix   . Arthritis    hands  . Breast cancer ([place])   . Colon cancer ([place])   . Diabetes mellitus without complication ([place])   . Endometrial carcinoma ([facility])    s/p total abdominal hysterectomy  . H/O compression fracture of spine 2014   thoracic spine  . H/O polycythemia vera   . H/O TIA (transient ischemic attack) and stroke 09/2014, 03/2015   No deficits  . Hypertension   . Hyperuricemia   . Microalbuminuria   . Polycythemia vera ([place])   . Recurrent falls   . Skin cancer    face, legs  . Stroke [facility]) 2008   no deficits  . Trochanteric bursitis   . Varicose veins    treated    Past Surgical History:  Procedure Laterality Date  . ABDOMINAL HYSTERECTOMY    . BOWEL RESECTION N/A 03/28/2015   Procedure: SMALL BOWEL RESECTION;  Surgeon: Leonie Green, MD;  Location: [facility] ORS;  Service: General;  Laterality: N/A;  . CATARACT EXTRACTION W/ INTRAOCULAR LENS IMPLANT Right   . CATARACT EXTRACTION W/PHACO Left 10/07/2015   Procedure: CATARACT EXTRACTION PHACO AND INTRAOCULAR LENS PLACEMENT (IOC);  Surgeon: Ronnell Freshwater, MD;  Location: [place];  Service: Ophthalmology;  Laterality: Left;  DIABETIC - oral meds VISION BLUE  . CORONARY ANGIOGRAPHY N/A 10/29/2017   Procedure: CORONARY ANGIOGRAPHY;  Surgeon: Dionisio David, MD;  Location: [place] [facility];  Service: Cardiovascular;  Laterality: N/A;  . EXPLORATORY LAPAROTOMY     for fibroids  . LEFT HEART CATH Right 10/29/2017   Procedure: Left Heart Cath;  Surgeon: Dionisio David, MD;   Location: [place] [facility];  Service: Cardiovascular;  Laterality: Right;  . TONSILLECTOMY      Family History  Problem Relation Age of Onset  . Cancer Brother        AML  . Heart attack Mother   . Breast cancer Mother   . Heart attack Father   . Heart attack Sister   . Diabetes Sister   Her brother died of AML.  Social History:  reports that she has quit smoking. She has never used smokeless tobacco. She reports that she does not drink alcohol or use drugs.  The patient's husband died of AML.  She has no family in [place]. 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 The patient is accompanied by her niece, [name], who has medical power of attorney, today.  Allergies:  Allergies  Allergen Reactions  . Simvastatin     Other reaction(s): Muscle Pain    Current Medications: Current Outpatient Medications  Medication Sig Dispense Refill  . amLODipine (NORVASC) 2.5 MG tablet Take 1 tablet (2.5 mg total) by mouth daily. 30 tablet 0  . aspirin 81 MG chewable tablet Chew 1 tablet (81 mg total) by mouth daily.    . blood glucose meter kit and supplies Dispense based on patient and insurance preference. Use up to four times daily as directed. (FOR ICD-10 E10.9, E11.9). 1 each 0  . calcium-vitamin D (OSCAL WITH D) 500-200 MG-UNIT per tablet Take 1 tablet by mouth daily.     . carvedilol (COREG) 25 MG tablet Take 1 tablet (25 mg total) by mouth 2 (two) times daily with a meal. 60 tablet 0  . clopidogrel (PLAVIX) 75 MG tablet Take 1 tablet (75 mg total) by mouth daily. 90 tablet 4  . docusate sodium (COLACE) 250 MG capsule Take 250 mg by mouth daily as needed for constipation.    . fexofenadine (ALLEGRA ALLERGY) 180 MG tablet Take 1 tablet (180 mg total) by mouth daily.    . hydroxyurea (HYDREA) 500 MG capsule Take 1 tablet daily except on Saturdays 30 capsule 0  . insulin detemir (LEVEMIR) 100 unit/ml SOLN Inject 0.08 mLs (8 Units  total) into the skin at bedtime. (Patient taking differently: Inject 9 Units into the skin at bedtime. ) 8 mL 1  . nitroGLYCERIN (NITRODUR - DOSED IN MG/24 HR) 0.2 mg/hr patch Place 1 patch (0.2 mg total) onto the skin daily. 30 patch 12  . pantoprazole (PROTONIX) 40 MG tablet Take 1 tablet (40 mg total) by mouth daily. 30 tablet 0  . pravastatin (PRAVACHOL) 20 MG tablet Take 1 tablet (20 mg total) by mouth at bedtime. 30 tablet 0  . Probiotic Product (PROBIOTIC DAILY PO) Take 1 tablet by mouth daily.    . ramipril (ALTACE) 10 MG capsule Take 1 capsule (10 mg total) by mouth 2 (two) times daily. 60 capsule 0  . sertraline (ZOLOFT) 25 MG tablet Take 25 mg by mouth daily.    Marland Kitchen spironolactone (ALDACTONE) 25 MG tablet  Take 1 tablet (25 mg total) by mouth daily. 30 tablet 0   No current facility-administered medications for this visit.     Review of Systems  Constitutional: Positive for malaise/fatigue and weight loss (down 4 pounds). Negative for diaphoresis and fever.       Recovering from recent MI and CVA.  HENT: Negative.  Negative for congestion, ear discharge, ear pain, nosebleeds, sinus pain and sore throat.   Eyes: Negative.   Respiratory: Negative.  Negative for cough, hemoptysis, sputum production and shortness of breath.   Cardiovascular: Positive for leg swelling (LLE). Negative for chest pain, palpitations, orthopnea and PND.       Recent NSTEMI. Ischemic CHF; recent echocardiogram showed EF of 25%.  Gastrointestinal: Positive for constipation. Negative for abdominal pain, blood in stool, diarrhea, heartburn, melena, nausea and vomiting.  Genitourinary: Negative for dysuria, frequency, hematuria and urgency.  Musculoskeletal: Negative for back pain, falls, joint pain (no knee pain), myalgias and neck pain.       Baker's cyst  Skin: Negative.  Negative for itching and rash.  Neurological: Positive for speech change (dysarthria) and weakness (generalized). Negative for dizziness,  tremors and headaches.       Recent cerebellar ICH. (+) dysmetria. (+) coordination and gait abnormalities. Requires assistance with ADLs.  Endo/Heme/Allergies: Does not bruise/bleed easily.       Diabetes  Psychiatric/Behavioral: Positive for memory loss (poor awareness of physical limitations). Negative for depression and suicidal ideas. The patient is not nervous/anxious and does not have insomnia.   All other systems reviewed and are negative.  Performance status (ECOG): 2-3  Vital Signs BP (!) 89/45 (BP Location: Right Arm, Patient Position: Sitting) Comment: Standing 80/43  Pulse 62 Comment: Standing 69  Temp (!) 96.6 F (35.9 C) (Tympanic)   Resp 18   Wt 109 lb 4 oz (49.6 kg)   SpO2 94%   BMI 19.98 kg/m   Physical Exam  Constitutional: She is oriented to person, place, and time and well-developed, well-nourished, and in no distress.  She has a rolling walker by her side.  HENT:  Head: Normocephalic and atraumatic.  Blonde hair.  Eyes: Pupils are equal, round, and reactive to light. EOM are normal. No scleral icterus.  Glasses. Blue eyes s/p cataract surgery.  Neck: Normal range of motion. Neck supple. No tracheal deviation present. No thyromegaly present.  Cardiovascular: Normal rate, regular rhythm and normal heart sounds. Exam reveals no gallop and no friction rub.  No murmur heard. Pulmonary/Chest: Effort normal and breath sounds normal. No respiratory distress. She has no wheezes. She has no rales.  Abdominal: Soft. Bowel sounds are normal. She exhibits no distension. There is no tenderness.  Musculoskeletal: Normal range of motion. She exhibits no edema or tenderness.  Lymphadenopathy:    She has no cervical adenopathy.    She has no axillary adenopathy.       Right: No inguinal and no supraclavicular adenopathy present.       Left: No inguinal and no supraclavicular adenopathy present.  Neurological: She is alert and oriented to person, place, and time.  Skin:  Skin is warm and dry. No rash noted. No erythema.  Psychiatric: Mood, affect and judgment normal.  Nursing note and vitals reviewed.    Orders Only on 01/24/2018  Component Date Value Ref Range Status  . Chromogranin A 01/24/2018 10* 0 - 5 nmol/L Final   Comment: (NOTE) Chromogranin A performed by [employer] methodology. Results for this test are designated to be for  research purposes only by the assay's manufacturer. The performance characteristics of this product have not been established. Results for this test should not be used as absolute evidence of presence or absence of malignant disease without confirmation of the diagnosis by another medically established diagnostic product or procedure. Values obtained with different assay methods or kits cannot be used interchangeably. Performed At: [facility] [place], [place] [id number] Rush Farmer MD AV:[id number]   . Sodium 01/24/2018 136  135 - 145 mmol/L Final  . Potassium 01/24/2018 4.7  3.5 - 5.1 mmol/L Final  . Chloride 01/24/2018 101  98 - 111 mmol/L Final  . CO2 01/24/2018 25  22 - 32 mmol/L Final  . Glucose, Bld 01/24/2018 197* 70 - 99 mg/dL Final  . BUN 01/24/2018 26* 8 - 23 mg/dL Final  . Creatinine, Ser 01/24/2018 1.35* 0.44 - 1.00 mg/dL Final  . Calcium 01/24/2018 9.4  8.9 - 10.3 mg/dL Final  . Total Protein 01/24/2018 6.6  6.5 - 8.1 g/dL Final  . Albumin 01/24/2018 4.0  3.5 - 5.0 g/dL Final  . AST 01/24/2018 18  15 - 41 U/L Final  . ALT 01/24/2018 17  0 - 44 U/L Final  . Alkaline Phosphatase 01/24/2018 36* 38 - 126 U/L Final  . Total Bilirubin 01/24/2018 0.7  0.3 - 1.2 mg/dL Final  . GFR calc non Af Amer 01/24/2018 35* >60 mL/min Final  . GFR calc Af Amer 01/24/2018 41* >60 mL/min Final   Comment: (NOTE) The eGFR has been calculated using the CKD EPI equation. This calculation has not been validated in all clinical situations. eGFR's persistently <60 mL/min signify possible  Chronic Kidney Disease.   Georgiann Hahn gap 01/24/2018 10  5 - 15 Final   Performed at [facility], [address] [place], [place] [address]  . WBC 01/24/2018 7.3  3.6 - 11.0 K/uL Final  . RBC 01/24/2018 4.62  3.80 - 5.20 MIL/uL Final  . Hemoglobin 01/24/2018 13.0  12.0 - 16.0 g/dL Final  . HCT 01/24/2018 40.8  35.0 - 47.0 % Final  . MCV 01/24/2018 88.2  80.0 - 100.0 fL Final  . MCH 01/24/2018 28.1  26.0 - 34.0 pg Final  . MCHC 01/24/2018 31.9* 32.0 - 36.0 g/dL Final  . RDW 01/24/2018 19.6* 11.5 - 14.5 % Final  . Platelets 01/24/2018 328  150 - 440 K/uL Final  . Neutrophils Relative % 01/24/2018 76  % Final  . Neutro Abs 01/24/2018 5.5  1.4 - 6.5 K/uL Final  . Lymphocytes Relative 01/24/2018 13  % Final  . Lymphs Abs 01/24/2018 1.0  1.0 - 3.6 K/uL Final  . Monocytes Relative 01/24/2018 9  % Final  . Monocytes Absolute 01/24/2018 0.6  0.2 - 0.9 K/uL Final  . Eosinophils Relative 01/24/2018 1  % Final  . Eosinophils Absolute 01/24/2018 0.1  0 - 0.7 K/uL Final  . Basophils Relative 01/24/2018 1  % Final  . Basophils Absolute 01/24/2018 0.1  0 - 0.1 K/uL Final   Performed at [facility], [address] [place], [place] [address]     Assessment:  [name] is a 84 y.o. female with JAK2+ polycythemia rubra vera (PV) diagnosed in 2008. She has undergone low phlebotomies x 2-3 to maintain a target hematocrit is 40-42.5.   She is on hydroxyurea 500 mg (1 pill) on Mondays, Wednesdays, Fridays and Saturdays (5 pills/week). Screening for von Willebrand and a platelet function was normal. She is on a  baby aspirin. She has been on Plavix since her TIA/CVA.   She has a history of iron deficiency anemia status post GI evaluation. Colonoscopy in 07/2007 was negative. For persistent recurrent iron deficiency anemia, she underwent EGD and colonoscopy in 12/2011 and 09/11/2013.  She has a history of carcinoid tumor after presenting with a partial small bowel obstruction.  Abdominal  and pelvic CT scan on 03/22/2015 revealed a 3 x 4.9 cm apple core lesion involving a loop of small bowel in the right mid abdomen. There was associated 4 x 1.8 cm adjacent mesenteric implant. Ileocolectomy on 03/28/2015 revealed a grade I neuroendocrine tumor (carcinoid) involving the terminal ileum and right colon.  Largest tumor was 2.5 cm. There were multiple adjacent nodules of a neuroendocrine tumor. Metastasis was in 9 of 12 lymph nodes.  Pathologic stage was T3N1.  Chromogranin was 4 on 05/07/2015, 3 on 04/13/2016, and 4 on 11/09/2016.   24 hour urine for 5HIAA was 5.2 mg/24 hours (0-14.9).  Octreotide scan on 05/31/2015 revealed no evidence of metastatic disease.  There was an indeterminate 9 mm nodule in the left apex.    Chest CT on 06/11/2015 revealed an 8 mm nodular density (spiculated on axial images) in the left apex. Chest CT on 10/10/2015 revealed a stable 8 mm pulmonary nodule in the anterior left lung apex.  Chest, abdomen and pelvic CT scan on 04/20/2016 revealed no definite evidence of recurrent or metastatic carcinoid tumor. There are no acute findings. There was a stable 8 mm pulmonary nodule in the left apex recommendations (follow-up CT in 12 months). 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 Recommendation was continued attention on follow-up or follow-up with abdominal MRI with and without contrast in 2 years.  Chest CT angiogram on 10/28/2017 revealed a stable 8 mm irregular density at the LEFT lung apex, probable scar.  She had shingles involving the right S1/S2 dermatome in 05/2017.    She was admitted to [facility] then [facility] from 10/29/2017 - 11/16/2017.  She had a NSTEMI and underwent cardiac cath which showed three-vessel disease and severe LV dysfunction.  EF was 25%.  CABG was felt high risk.  Post cardiac cath, head CT showed a RIGHT cerebellar parenchymal hemorrhage felt to be secondary  to hypertensive etiology on dual antiplatelet therapy. She was scheduled for PCI with stent placement on 01/13/2018, however after second option with Dr. Clayborn Bigness, PCI was deferred.  She is on aspirin and Plavix.  Symptomatically, she continues to recover from her MI and cerebellar hemorrhage.  She requires assistance with her ADLs.  Hematocrit is 40.8 and platelets 328,000.  Plan: 1. Labs today: CBC with differential, CMP, chromogranin A. 2. JAK 2+ polycythemia rubra vera - stable   Platelet count stable at 328,000 today. Goal </= 400,000. Continues hydroxyurea 500 mg x 5 pills/week.   Hematocrit stable at 40.9. Goal is a hematocrit of </= 42.  No therapeutic phlebotomy required today.  3. Carcinoid tumor of the terminal ileum:    Check biomarkers (chromagranin A; consider 24 hour urine for 5-HIAA)   Chest CT left apex stable in 10/2017. 4.  Benign-appearing cystic lesion in the pancreatic tail lesion:   Follow-up MRI abdomen on 04/20/2018. 5.  Borderline blood pressure:   Contact Dr. Etta Quill office regarding blood pressure. 6.  RTC in 6 weeks for labs (CBC with diff, BMP). 7.  RTC in 3 months for MD assess, labs (CBC with  diff, CMP), and review of MRI.   Lequita Asal, MD 01/24/2018, 5:35 PM

## 2018-01-24 ENCOUNTER — Other Ambulatory Visit: Payer: Self-pay | Admitting: Hematology and Oncology

## 2018-01-24 ENCOUNTER — Inpatient Hospital Stay: Payer: Medicare Other | Attending: Hematology and Oncology

## 2018-01-24 ENCOUNTER — Inpatient Hospital Stay (HOSPITAL_BASED_OUTPATIENT_CLINIC_OR_DEPARTMENT_OTHER): Payer: Medicare Other | Admitting: Hematology and Oncology

## 2018-01-24 ENCOUNTER — Other Ambulatory Visit: Payer: Self-pay

## 2018-01-24 ENCOUNTER — Encounter: Payer: Self-pay | Admitting: Hematology and Oncology

## 2018-01-24 VITALS — BP 89/45 | HR 62 | Temp 96.6°F | Resp 18 | Wt 109.2 lb

## 2018-01-24 DIAGNOSIS — D45 Polycythemia vera: Secondary | ICD-10-CM

## 2018-01-24 DIAGNOSIS — Z87891 Personal history of nicotine dependence: Secondary | ICD-10-CM | POA: Diagnosis not present

## 2018-01-24 DIAGNOSIS — K869 Disease of pancreas, unspecified: Secondary | ICD-10-CM | POA: Diagnosis not present

## 2018-01-24 DIAGNOSIS — Z8673 Personal history of transient ischemic attack (TIA), and cerebral infarction without residual deficits: Secondary | ICD-10-CM | POA: Diagnosis not present

## 2018-01-24 DIAGNOSIS — Z7982 Long term (current) use of aspirin: Secondary | ICD-10-CM | POA: Diagnosis not present

## 2018-01-24 DIAGNOSIS — D3A029 Benign carcinoid tumor of the large intestine, unspecified portion: Secondary | ICD-10-CM

## 2018-01-24 DIAGNOSIS — E119 Type 2 diabetes mellitus without complications: Secondary | ICD-10-CM | POA: Diagnosis not present

## 2018-01-24 DIAGNOSIS — I214 Non-ST elevation (NSTEMI) myocardial infarction: Secondary | ICD-10-CM | POA: Insufficient documentation

## 2018-01-24 DIAGNOSIS — R911 Solitary pulmonary nodule: Secondary | ICD-10-CM

## 2018-01-24 DIAGNOSIS — Z794 Long term (current) use of insulin: Secondary | ICD-10-CM | POA: Diagnosis not present

## 2018-01-24 LAB — COMPREHENSIVE METABOLIC PANEL
ALT: 17 U/L (ref 0–44)
AST: 18 U/L (ref 15–41)
Albumin: 4 g/dL (ref 3.5–5.0)
Alkaline Phosphatase: 36 U/L — ABNORMAL LOW (ref 38–126)
Anion gap: 10 (ref 5–15)
BUN: 26 mg/dL — ABNORMAL HIGH (ref 8–23)
CO2: 25 mmol/L (ref 22–32)
Calcium: 9.4 mg/dL (ref 8.9–10.3)
Chloride: 101 mmol/L (ref 98–111)
Creatinine, Ser: 1.35 mg/dL — ABNORMAL HIGH (ref 0.44–1.00)
GFR calc Af Amer: 41 mL/min — ABNORMAL LOW (ref >60)
GFR calc non Af Amer: 35 mL/min — ABNORMAL LOW (ref >60)
Glucose, Bld: 197 mg/dL — ABNORMAL HIGH (ref 70–99)
Potassium: 4.7 mmol/L (ref 3.5–5.1)
Sodium: 136 mmol/L (ref 135–145)
Total Bilirubin: 0.7 mg/dL (ref 0.3–1.2)
Total Protein: 6.6 g/dL (ref 6.5–8.1)

## 2018-01-24 LAB — CBC WITH DIFFERENTIAL/PLATELET
Basophils Absolute: 0.1 10*3/uL (ref 0–0.1)
Basophils Relative: 1 %
Eosinophils Absolute: 0.1 10*3/uL (ref 0–0.7)
Eosinophils Relative: 1 %
HCT: 40.8 % (ref 35.0–47.0)
Hemoglobin: 13 g/dL (ref 12.0–16.0)
Lymphocytes Relative: 13 %
Lymphs Abs: 1 10*3/uL (ref 1.0–3.6)
MCH: 28.1 pg (ref 26.0–34.0)
MCHC: 31.9 g/dL — ABNORMAL LOW (ref 32.0–36.0)
MCV: 88.2 fL (ref 80.0–100.0)
Monocytes Absolute: 0.6 10*3/uL (ref 0.2–0.9)
Monocytes Relative: 9 %
Neutro Abs: 5.5 10*3/uL (ref 1.4–6.5)
Neutrophils Relative %: 76 %
Platelets: 328 10*3/uL (ref 150–440)
RBC: 4.62 MIL/uL (ref 3.80–5.20)
RDW: 19.6 % — ABNORMAL HIGH (ref 11.5–14.5)
WBC: 7.3 10*3/uL (ref 3.6–11.0)

## 2018-01-24 NOTE — Progress Notes (Signed)
Patient offers no complaints today.  Patient is under cardiology care for recent stroke and MI.

## 2018-01-25 ENCOUNTER — Encounter: Payer: Self-pay | Admitting: Occupational Therapy

## 2018-01-25 ENCOUNTER — Ambulatory Visit: Payer: Medicare Other | Admitting: Occupational Therapy

## 2018-01-25 DIAGNOSIS — R278 Other lack of coordination: Secondary | ICD-10-CM

## 2018-01-25 DIAGNOSIS — M6281 Muscle weakness (generalized): Secondary | ICD-10-CM | POA: Diagnosis not present

## 2018-01-25 DIAGNOSIS — R41841 Cognitive communication deficit: Secondary | ICD-10-CM

## 2018-01-26 ENCOUNTER — Ambulatory Visit: Payer: Medicare Other | Admitting: Physical Therapy

## 2018-01-26 ENCOUNTER — Encounter: Payer: Medicare Other | Admitting: Speech Pathology

## 2018-01-26 LAB — CHROMOGRANIN A: Chromogranin A: 10 nmol/L — ABNORMAL HIGH (ref 0–5)

## 2018-01-28 ENCOUNTER — Ambulatory Visit: Payer: Medicare Other | Admitting: Speech Pathology

## 2018-01-28 ENCOUNTER — Encounter: Payer: Self-pay | Admitting: Speech Pathology

## 2018-01-28 DIAGNOSIS — M6281 Muscle weakness (generalized): Secondary | ICD-10-CM | POA: Diagnosis not present

## 2018-01-28 DIAGNOSIS — R471 Dysarthria and anarthria: Secondary | ICD-10-CM

## 2018-01-28 DIAGNOSIS — I69123 Fluency disorder following nontraumatic intracerebral hemorrhage: Secondary | ICD-10-CM

## 2018-01-28 DIAGNOSIS — R41841 Cognitive communication deficit: Secondary | ICD-10-CM

## 2018-01-28 NOTE — Therapy (Signed)
[place] MAIN [facility] SERVICES [address] [place], [place], [address] Phone: [phone]   Fax:  [phone]  Speech Language Pathology Treatment  Patient Details  Name: [name] MRN: [id number] Date of Birth: [birth year] Referring Provider: Charlett Blake    Encounter Date: 01/28/2018  End of Session - 01/28/18 1622    Visit Number  2    Number of Visits  17    Date for SLP Re-Evaluation  03/21/18    SLP Start Time  65    SLP Stop Time   1500    SLP Time Calculation (min)  60 min    Activity Tolerance  Patient tolerated treatment well       Past Medical History:  Diagnosis Date  . Adenocarcinoma in situ of cervix   . Arthritis    hands  . Breast cancer ([place])   . Colon cancer ([place])   . Diabetes mellitus without complication ([place])   . Endometrial carcinoma (HCC)    s/p total abdominal hysterectomy  . H/O compression fracture of spine 2014   thoracic spine  . H/O polycythemia vera   . H/O TIA (transient ischemic attack) and stroke 09/2014, 03/2015   No deficits  . Hypertension   . Hyperuricemia   . Microalbuminuria   . Polycythemia vera ([place])   . Recurrent falls   . Skin cancer    face, legs  . Stroke [facility]) 2008   no deficits  . Trochanteric bursitis   . Varicose veins    treated    Past Surgical History:  Procedure Laterality Date  . ABDOMINAL HYSTERECTOMY    . BOWEL RESECTION N/A 03/28/2015   Procedure: SMALL BOWEL RESECTION;  Surgeon: Leonie Green, MD;  Location: [facility] ORS;  Service: General;  Laterality: N/A;  . CATARACT EXTRACTION W/ INTRAOCULAR LENS IMPLANT Right   . CATARACT EXTRACTION W/PHACO Left 10/07/2015   Procedure: CATARACT EXTRACTION PHACO AND INTRAOCULAR LENS PLACEMENT (IOC);  Surgeon: Ronnell Freshwater, MD;  Location: [place];  Service: Ophthalmology;  Laterality: Left;  DIABETIC - oral meds VISION BLUE  . CORONARY ANGIOGRAPHY N/A 10/29/2017   Procedure: CORONARY  ANGIOGRAPHY;  Surgeon: Dionisio David, MD;  Location: [place] CV LAB;  Service: Cardiovascular;  Laterality: N/A;  . EXPLORATORY LAPAROTOMY     for fibroids  . LEFT HEART CATH Right 10/29/2017   Procedure: Left Heart Cath;  Surgeon: Dionisio David, MD;  Location: [place] CV LAB;  Service: Cardiovascular;  Laterality: Right;  . TONSILLECTOMY      There were no vitals filed for this visit.  Subjective Assessment - 01/28/18 1621    Subjective  "I used to write very clearly"            ADULT SLP TREATMENT - 01/28/18 0001      General Information   Behavior/Cognition  Alert;Cooperative;Pleasant mood    HPI   84 year old female with adenocarcinoma of the cervix as well as endometrial carcinoma and polycythemia vera as well as diabetes who had hypertensive right cerebellar hemorrhage onset 10/28/2017.  She was treated initially at [facility] and then transferred to [facility].  Neurosurgical evaluation per Dr. Ronnald Ramp concluded no surgery was needed.  The patient completed inpatient rehabilitation at [facility] (including SLP) 11/04/2017-11/18/2107 and was discharged at a 24-hour supervision level.  The patient has hired caregiver to provide 24-hour supervision. Patient has received home health rehab services, including SLP.  Treatment Provided   Treatment provided  Cognitive-Linquistic      Pain Assessment   Pain Assessment  No/denies pain      Cognitive-Linquistic Treatment   Treatment focused on  Apraxia;Aphasia    Skilled Treatment  HANDWRITING: the patient is concerned regarding her handwriting.  A sample shows very small, cramped writing with poor legibility.  Legibility improves with lined paper to encourage larger letters.  Patient was given lined paper to practice writing at home. FLUENCY: spontaneous speech is notable for significant episodes of dysfluency.  The patient is 100% fluent when reading words and sentences.  Patient is 70% fluency in  simple cognitive linguistic task.  WORD FINDING: Patient is able to complete simple verbal analogies but cannot express analogous relationship.      Assessment / Recommendations / Plan   Plan  Continue with current plan of care      Progression Toward Goals   Progression toward goals  Progressing toward goals       SLP Education - 01/28/18 1622    Education Details  write bigger, slow speech to improve fluency    Person(s) Educated  Patient    Methods  Explanation    Comprehension  Verbalized understanding         SLP Long Term Goals - 01/19/18 1443      SLP LONG TERM GOAL #1   Title  Patient will generate fluent, grammatical, and cogent sentence to complete simple/concrete linguistic task with 80% accuracy.    Time  4    Period  Weeks    Status  New    Target Date  02/18/18      SLP LONG TERM GOAL #2   Title  Patient will generate grammatical, fluent, and cogent sentences to complete abstract/complex linguistic task with 80% accuracy.    Time  8    Period  Weeks    Status  New    Target Date  03/21/18      SLP LONG TERM GOAL #3   Title  Patient will identify cognitive-communication barriers and participate in developing functional compensatory strategies.    Time  8    Period  Weeks    Status  New    Target Date  03/21/18      SLP LONG TERM GOAL #4   Title  Patient will complete moderately complex executive function skills tasks with 80% accuracy.    Time  8    Period  Weeks    Status  New    Target Date  03/21/18       Plan - 01/28/18 1623    Clinical Impression Statement  The patient is most fluent under very controlled setting, such as reading aloud.  Her fluency decreases as the cognitive load of the task increases.    Speech Therapy Frequency  2x / week    Duration  Other (comment)    Treatment/Interventions  Compensatory strategies;Patient/family education;Cognitive reorganization;Language facilitation;Internal/external aids    Potential to Achieve  Goals  Good    Potential Considerations  Ability to learn/carryover information;Pain level;Family/community support;Co-morbidities;Previous level of function;Cooperation/participation level;Severity of impairments;Medical prognosis    SLP Home Exercise Plan  writing, analogies    Consulted and Agree with Plan of Care  Patient       Patient will benefit from skilled therapeutic intervention in order to improve the following deficits and impairments:   Dysarthria  Fluency disorder following nontraumatic intracerebral hemorrhage  Cognitive communication deficit    Problem List  Patient Active Problem List   Diagnosis Date Noted  . Cognitive deficit, post-stroke 12/31/2017  . Ataxia, post-stroke 12/31/2017  . Dysarthria, post-stroke   . Gait disturbance, post-stroke   . H/O cerebral parenchymal hemorrhage 11/04/2017  . Essential hypertension 11/04/2017  . Hyperlipidemia 11/04/2017  . Diabetes ([place]) 11/04/2017  . CAD (coronary artery disease) 11/04/2017  . 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 Hypokalemia 11/04/2017  . Right-sided nontraumatic intracerebral hemorrhage of cerebellum ([place])   . History of cervical cancer   . History of TIA (transient ischemic attack)   . Acute systolic congestive heart failure ([place])   . Reactive hypertension   . Hypernatremia   . Leukocytosis   . Acute blood loss anemia   . Elevated serum creatinine   . Acute respiratory failure with hypoxia ([place])   . IVH (intraventricular hemorrhage) ([place]) 10/29/2017  . Hypoxia 10/28/2017  . Pancreatic lesion 05/24/2017  . 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 Nodule of upper lobe of left lung 06/04/2015  . 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 Cerebral thrombosis with cerebral infarction 04/03/2015  . Cancer of right colon ([place]) 03/28/2015  . CVA (cerebral infarction) 12/21/2014  . Polycythemia vera ([place]) 06/22/2006   Leroy Sea, MS/CCC- SLP  Lou Miner 01/28/2018, 4:24 PM  [place] MAIN [facility] SERVICES [address] [place], [place], [address] Phone: [phone]   Fax:  [phone]   Name: TAHESHA SKEET MRN: [id number] Date of Birth: January 18, 1932

## 2018-01-31 ENCOUNTER — Ambulatory Visit: Payer: Medicare Other | Admitting: Physical Therapy

## 2018-01-31 ENCOUNTER — Other Ambulatory Visit: Payer: Self-pay

## 2018-01-31 ENCOUNTER — Encounter: Payer: Self-pay | Admitting: Physical Therapy

## 2018-01-31 DIAGNOSIS — M6281 Muscle weakness (generalized): Secondary | ICD-10-CM

## 2018-01-31 DIAGNOSIS — R262 Difficulty in walking, not elsewhere classified: Secondary | ICD-10-CM

## 2018-01-31 DIAGNOSIS — R278 Other lack of coordination: Secondary | ICD-10-CM

## 2018-01-31 NOTE — Therapy (Signed)
[place] MAIN [facility] SERVICES [address] [place], [place], [address] Phone: [phone]   Fax:  [phone]  Physical Therapy Treatment  Patient Details  Name: [name] MRN: [id number] Date of Birth: [birth year] Referring Provider: Charlett Blake   Encounter Date: 01/31/2018  PT End of Session - 01/31/18 1115    Visit Number  1    Number of Visits  25    Date for PT Re-Evaluation  04/25/18    Authorization Type  1/10 with [facility] 01/31/18    PT Start Time  1100    PT Stop Time  1145    PT Time Calculation (min)  45 min    Equipment Utilized During Treatment  Gait belt    Activity Tolerance  Patient tolerated treatment well    Behavior During Therapy  [facility] for tasks assessed/performed       Past Medical History:  Diagnosis Date  . Adenocarcinoma in situ of cervix   . Arthritis    hands  . Breast cancer ([place])   . Colon cancer ([place])   . Diabetes mellitus without complication ([place])   . Endometrial carcinoma (HCC)    s/p total abdominal hysterectomy  . H/O compression fracture of spine 2014   thoracic spine  . H/O polycythemia vera   . H/O TIA (transient ischemic attack) and stroke 09/2014, 03/2015   No deficits  . Hypertension   . Hyperuricemia   . Microalbuminuria   . Polycythemia vera ([place])   . Recurrent falls   . Skin cancer    face, legs  . Stroke [facility]) 2008   no deficits  . Trochanteric bursitis   . Varicose veins    treated    Past Surgical History:  Procedure Laterality Date  . ABDOMINAL HYSTERECTOMY    . BOWEL RESECTION N/A 03/28/2015   Procedure: SMALL BOWEL RESECTION;  Surgeon: Leonie Green, MD;  Location: [facility] ORS;  Service: General;  Laterality: N/A;  . CATARACT EXTRACTION W/ INTRAOCULAR LENS IMPLANT Right   . CATARACT EXTRACTION W/PHACO Left 10/07/2015   Procedure: CATARACT EXTRACTION PHACO AND INTRAOCULAR LENS PLACEMENT (IOC);  Surgeon: Ronnell Freshwater, MD;  Location: [place];  Service: Ophthalmology;  Laterality: Left;  DIABETIC - oral meds VISION BLUE  . CORONARY ANGIOGRAPHY N/A 10/29/2017   Procedure: CORONARY ANGIOGRAPHY;  Surgeon: Dionisio David, MD;  Location: [place] CV LAB;  Service: Cardiovascular;  Laterality: N/A;  . EXPLORATORY LAPAROTOMY     for fibroids  . LEFT HEART CATH Right 10/29/2017   Procedure: Left Heart Cath;  Surgeon: Dionisio David, MD;  Location: [place] CV LAB;  Service: Cardiovascular;  Laterality: Right;  . TONSILLECTOMY      There were no vitals filed for this visit.  Subjective Assessment - 01/31/18 1100    Subjective  Patient is having difficulty with walking and needs to use a RW.     Pertinent History    84 year old female with adenocarcinoma of the cervix as well as endometrial carcinoma and polycythemia vera as well as diabetes who had hypertensive right cerebellar hemorrhage onset 10/28/2017.  She was treated initially at [facility] and then transferred to [facility].  Neurosurgical evaluation per Dr. Ronnald Ramp concluded no surgery was needed.  The patient completed inpatient rehabilitation at [facility] (including SLP) 11/04/2017-11/18/2107 and was discharged at a 24-hour supervision level.  The patient has hired caregiver to provide 24-hour supervision. Patient has received home health  rehab services.She was not using a RW prior to CVA .    Limitations  Standing;Walking    How long can you walk comfortably?  a few minutes    Patient Stated Goals  Patient wants to walk better and not need the RW.     Currently in Pain?  No/denies    Pain Score  0-No pain         [facility] PT Assessment - 01/31/18 1110      Assessment   Medical Diagnosis  CVA    Referring Provider  Alysia Penna E    Onset Date/Surgical Date  10/28/17    Hand Dominance  Right      Precautions   Precautions  Fall      Restrictions   Weight Bearing Restrictions  No      Balance Screen   Has the patient fallen in the  past 6 months  Yes    How many times?  --   2   Has the patient had a decrease in activity level because of a fear of falling?   Yes      [place] residence    Living Arrangements  Non-relatives/Friends    Available Help at Discharge  Personal care attendant    Type of [place] to enter    Entrance Stairs-Number of Steps  2    [place]  One level    Wall - 2 wheels;Cane - single point;Shower seat;Grab bars - tub/shower      Prior Function   Level of Independence  Independent with basic ADLs    Vocation  Retired      [employer] of Impairment  Memory        POSTURE: WNL standing  PROM/AROM:BUE and BLE WFL  STRENGTH:  Graded on a 0-5 scale Muscle Group Left Right                          Hip Flex 4/5 3+/5  Hip Abd 4/5 3/5  Hip Add 3/5 3/5  Hip Ext 2/5 2/5  Hip IR/ER    Knee Flex 4/5 4/5  Knee Ext 4/5 4/5  Ankle DF 4/5 4/5  Ankle PF 3/5 3/5   SENSATION: Patient reports no numbness or tingling   :FUNCTIONAL MOBILITY: Patient uses UE for support sit to stand   BALANCE: Standing Dynamic Balance  Normal Stand independently unsupported, able to weight shift and cross midline maximally   Good Stand independently unsupported, able to weight shift and cross midline moderately   Good-/Fair+ Stand independently unsupported, able to weight shift across midline minimally   Fair Stand independently unsupported, weight shift, and reach ipsilaterally, loss of balance when crossing midline x  Poor+ Able to stand with Min A and reach ipsilaterally, unable to weight shift   Poor Able to stand with Mod A and minimally reach ipsilaterally, unable to cross midline.    Static Standing Balance  Normal Able to maintain standing balance against maximal resistance   Good Able to maintain standing balance against moderate resistance   Good-/Fair+ Able to  maintain standing balance against minimal resistance   Fair Able to stand unsupported without UE support and without LOB for 1-2 min x  Fair- Requires Min A and UE support to maintain standing without loss of balance  Poor+ Requires mod A and UE support to maintain standing without loss of balance   Poor Requires max A and UE support to maintain standing balance without loss     GAIT: Ambulates with RW short distances and intermediate distances with decreased gait speed and path deviation with distraction and turns.  OUTCOME MEASURES: TEST Outcome Interpretation  5 times sit<>stand 34.21sec >37 yo, >15 sec indicates increased risk for falls  10 meter walk test   .49             m/s <1.0 m/s indicates increased risk for falls; limited community ambulator  Timed up and Go     22.07            sec <14 sec indicates increased risk for falls  6 minute walk test    550            Feet 1000 feet is community ambulator                                 PT Education - 01/31/18 1114    Education Details  plan of care    Person(s) Educated  Patient    Methods  Explanation    Comprehension  Verbalized understanding       PT Short Term Goals - 01/31/18 1120      PT SHORT TERM GOAL #1   Title  Patient will reduce timed up and go to <11 seconds to reduce fall risk and demonstrate improved transfer/gait ability.    Baseline  22.07 sec    Time  6    Period  Weeks    Status  New    Target Date  02/28/18      PT SHORT TERM GOAL #2   Title  Patient will be independent with ascend/descend 2 steps using single UE in step over step pattern without LOB.    Time  6    Period  Weeks    Status  New    Target Date  02/28/18      PT SHORT TERM GOAL #3   Time  6    Target Date  03/14/18        PT Long Term Goals - 01/31/18 1119      PT LONG TERM GOAL #1   Title  Patient will be independent in home exercise program to improve strength/mobility for better functional  independence with ADLs.    Time  12    Period  Weeks    Status  New    Target Date  04/25/18      PT LONG TERM GOAL #2   Title  Patient (> 51 years old) will complete five times sit to stand test in < 15 seconds indicating an increased LE strength and improved balance    Baseline  34.21 sec    Time  12    Period  Weeks    Status  New    Target Date  04/25/18      PT LONG TERM GOAL #3   Title  Patient will increase six minute walk test distance to >1000 for progression to community ambulator and improve gait ability    Baseline  550 feet    Time  12    Period  Weeks    Status  New    Target Date  04/25/18      PT LONG TERM GOAL #4  Title  Patient will increase 10 meter walk test to >1.22m/s as to improve gait speed for better community ambulation and to reduce fall risk    Baseline  .49 m/sec    Time  12    Period  Weeks    Status  New    Target Date  04/25/18            Plan - 01/31/18 1116    Clinical Impression Statement  Patient is 84 year old female with recent Dx of CVA  She has decreased strength RUE and RLE , decreased dynamic standing balance and decreased mobiltiy with RW. She has decreased outcome meausres that indicate a falls risk. She will benefit from skillled PT to improve falls risk and mobility and return to PLOF.    Clinical Presentation  Stable    Clinical Decision Making  Low    Rehab Potential  Fair    PT Frequency  2x / week    PT Duration  12 weeks    PT Treatment/Interventions  Patient/family education;Neuromuscular re-education;Balance training;Stair training;Therapeutic activities;Therapeutic exercise;Gait training;Aquatic Therapy    PT Next Visit Plan  balance and strengthening    PT Home Exercise Plan  unable to provide due to short Evaluation session scheduled    Consulted and Agree with Plan of Care  Patient;Family member/caregiver       Patient will benefit from skilled therapeutic intervention in order to improve the following  deficits and impairments:  Abnormal gait, Decreased balance, Decreased endurance, Decreased mobility, Impaired flexibility, Decreased strength, Decreased knowledge of use of DME, Decreased activity tolerance, Difficulty walking  Visit Diagnosis: Muscle weakness (generalized)  Other lack of coordination  Difficulty in walking, not elsewhere classified     Problem List Patient Active Problem List   Diagnosis Date Noted  . Cognitive deficit, post-stroke 12/31/2017  . Ataxia, post-stroke 12/31/2017  . Dysarthria, post-stroke   . Gait disturbance, post-stroke   . H/O cerebral parenchymal hemorrhage 11/04/2017  . Essential hypertension 11/04/2017  . Hyperlipidemia 11/04/2017  . Diabetes ([place]) 11/04/2017  . CAD (coronary artery disease) 11/04/2017  . 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 Hypokalemia 11/04/2017  . Right-sided nontraumatic intracerebral hemorrhage of cerebellum ([place])   . History of cervical cancer   . History of TIA (transient ischemic attack)   . Acute systolic congestive heart failure ([place])   . Reactive hypertension   . Hypernatremia   . Leukocytosis   . Acute blood loss anemia   . Elevated serum creatinine   . Acute respiratory failure with hypoxia ([place])   . IVH (intraventricular hemorrhage) ([place]) 10/29/2017  . Hypoxia 10/28/2017  . Pancreatic lesion 05/24/2017  . 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 Nodule of upper lobe of left lung 06/04/2015  . 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) 04/11/2015  . Cerebral thrombosis with cerebral infarction 04/03/2015  . Cancer of right colon ([place]) 03/28/2015  . CVA (cerebral infarction) 12/21/2014  . Polycythemia vera ([place]) 06/22/2006    [name], [place] DPT 01/31/2018, 12:21 PM  [place] MAIN [facility] SERVICES [address] [place], [place], [address] Phone: [phone]   Fax:  [phone]  Name: [name] MRN: [id number] Date of Birth:  07/21/1931

## 2018-02-01 ENCOUNTER — Encounter: Payer: Self-pay | Admitting: Occupational Therapy

## 2018-02-01 ENCOUNTER — Ambulatory Visit: Payer: Medicare Other | Admitting: Occupational Therapy

## 2018-02-01 DIAGNOSIS — M6281 Muscle weakness (generalized): Secondary | ICD-10-CM

## 2018-02-01 DIAGNOSIS — R278 Other lack of coordination: Secondary | ICD-10-CM

## 2018-02-01 NOTE — Therapy (Signed)
[place] MAIN [facility] SERVICES [address] [place], [place], [address] Phone: [phone]   Fax:  [phone]  Occupational Therapy Treatment  Patient Details  Name: [name] MRN: [id number] Date of Birth: [birth year] Referring Provider: Charlett Blake   Encounter Date: 01/25/2018  OT End of Session - 02/01/18 1436    Visit Number  2    Number of Visits  24    Date for OT Re-Evaluation  04/14/18    Authorization Type  Medicare    Authorization Time Period  visit 2/10, reporting period starting 01/20/2018    OT Start Time  1402    OT Stop Time  1445    OT Time Calculation (min)  43 min    Activity Tolerance  Patient tolerated treatment well    Behavior During Therapy  [facility] for tasks assessed/performed       Past Medical History:  Diagnosis Date  . Adenocarcinoma in situ of cervix   . Arthritis    hands  . Breast cancer ([place])   . Colon cancer ([place])   . Diabetes mellitus without complication ([place])   . Endometrial carcinoma (HCC)    s/p total abdominal hysterectomy  . H/O compression fracture of spine 2014   thoracic spine  . H/O polycythemia vera   . H/O TIA (transient ischemic attack) and stroke 09/2014, 03/2015   No deficits  . Hypertension   . Hyperuricemia   . Microalbuminuria   . Polycythemia vera ([place])   . Recurrent falls   . Skin cancer    face, legs  . Stroke [facility]) 2008   no deficits  . Trochanteric bursitis   . Varicose veins    treated    Past Surgical History:  Procedure Laterality Date  . ABDOMINAL HYSTERECTOMY    . BOWEL RESECTION N/A 03/28/2015   Procedure: SMALL BOWEL RESECTION;  Surgeon: Leonie Green, MD;  Location: [facility] ORS;  Service: General;  Laterality: N/A;  . CATARACT EXTRACTION W/ INTRAOCULAR LENS IMPLANT Right   . CATARACT EXTRACTION W/PHACO Left 10/07/2015   Procedure: CATARACT EXTRACTION PHACO AND INTRAOCULAR LENS PLACEMENT (IOC);  Surgeon: Ronnell Freshwater, MD;  Location:  [place];  Service: Ophthalmology;  Laterality: Left;  DIABETIC - oral meds VISION BLUE  . CORONARY ANGIOGRAPHY N/A 10/29/2017   Procedure: CORONARY ANGIOGRAPHY;  Surgeon: Dionisio David, MD;  Location: [place] CV LAB;  Service: Cardiovascular;  Laterality: N/A;  . EXPLORATORY LAPAROTOMY     for fibroids  . LEFT HEART CATH Right 10/29/2017   Procedure: Left Heart Cath;  Surgeon: Dionisio David, MD;  Location: [place] CV LAB;  Service: Cardiovascular;  Laterality: Right;  . TONSILLECTOMY      There were no vitals filed for this visit.  Subjective Assessment - 02/01/18 1435    Subjective   Patient reports her caregiver had a death in the family this morning.      Pertinent History  84 year old female with adenocarcinoma of the cervix as well as endometrial carcinoma and polycythemia vera as well as diabetes who had hypertensive right cerebellar hemorrhage onset 10/28/2017.  She was treated initially at [facility] and then transferred to [facility].  Neurosurgical evaluation per Dr. Ronnald Ramp concluded no surgery was needed.  The patient completed inpatient rehabilitation at [facility] (including SLP) 11/04/2017-11/18/2107 and was discharged at a 24-hour supervision level.  The patient has hired caregiver to provide 24-hour supervision. Patient has received home health  rehab services, including SLP.     Patient Stated Goals  Patient would like t be independent in all tasks and be able to live alone again.    Currently in Pain?  No/denies    Pain Score  0-No pain         Patient seen this date for RUE strengthening with 1.5# dowel weight for shoulder flexion, ABD, ADD, forwards circles,  Backwards turning, chest press for 2 sets of 10 repetitions each with cues for form and technique, rest breaks as needed.   Manipulation of grooved pegs into pegboard, moderate difficulty noted with turning pegs to place into correct position.  Handwriting with right  dominant hand, formulating list of 10 vegetables, patient has moderate difficulty recalling vegetables,  Was able to list 5-6.  Legibility was poor and therapist was only able to read 2 of the 7.                    OT Education - 02/01/18 1436    Education Details  exercises for strengthening, handwriting    Person(s) Educated  Patient    Methods  Explanation;Demonstration;Verbal cues    Comprehension  Verbalized understanding;Returned demonstration;Verbal cues required          OT Long Term Goals - 01/22/18 1121      OT LONG TERM GOAL #1   Title  Patient will complete bathing with modified independence    Baseline  moderate assist    Time  12    Period  Weeks    Status  New      OT LONG TERM GOAL #2   Title  Patient will complete dressing skills with modified independence    Baseline  moderate assist    Time  12    Period  Weeks    Status  New      OT LONG TERM GOAL #3   Title  Patient will complete light meal prep with min assist    Baseline  max assist    Time  12    Period  Weeks    Status  New      OT LONG TERM GOAL #4   Title  Patient will increase R grip strength by 5# to open jars and containers with modified independence    Baseline  difficulty at eval    Time  12    Period  Weeks    Status  New      OT LONG TERM GOAL #5   Title  Patient will improve strength by 1 mm grade RUE to assist with obtaining items from the closet.     Baseline  4/5 overall RUE    Time  8    Period  Weeks    Status  New            Plan - 02/01/18 1436    Clinical Impression Statement  Patient requiring cues for exercises and occasional redirection with use of weighted dowel.  She had moderate difficulty with manipulation of grooved pegs and placing into board, difficulty with turning motion to get the peg in the correct direction.  Patient legibility is poor when performing handwriting and has difficulty even reading her own writing.  Will need continued  focus in this area to improve right hand function.     Occupational Profile and client history currently impacting functional performance  family more than 30 mins away, has 24 hour caregiver assist, decreased awareness, memory and safety  Occupational performance deficits (Please refer to evaluation for details):  ADL's;IADL's;Social Participation    Rehab Potential  Good    Current Impairments/barriers affecting progress:  memory, requires 24 hour caregivers,     OT Frequency  2x / week    OT Duration  12 weeks    OT Treatment/Interventions  Self-care/ADL training;Cryotherapy;Therapeutic exercise;DME and/or AE instruction;Functional Mobility Training;Cognitive remediation/compensation;Balance training;Neuromuscular education;Manual Therapy;Moist Heat;Therapeutic activities;Patient/family education    Consulted and Agree with Plan of Care  Patient       Patient will benefit from skilled therapeutic intervention in order to improve the following deficits and impairments:  Decreased balance, Decreased mobility, Difficulty walking, Decreased cognition, Decreased activity tolerance, Decreased coordination, Decreased safety awareness, Decreased strength, Impaired UE functional use  Visit Diagnosis: Muscle weakness (generalized)  Other lack of coordination  Cognitive communication deficit    Problem List Patient Active Problem List   Diagnosis Date Noted  . Cognitive deficit, post-stroke 12/31/2017  . Ataxia, post-stroke 12/31/2017  . Dysarthria, post-stroke   . Gait disturbance, post-stroke   . H/O cerebral parenchymal hemorrhage 11/04/2017  . Essential hypertension 11/04/2017  . Hyperlipidemia 11/04/2017  . Diabetes ([place]) 11/04/2017  . CAD (coronary artery disease) 11/04/2017  . 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 Hypokalemia 11/04/2017  . Right-sided nontraumatic intracerebral hemorrhage of cerebellum ([place])   . History of cervical cancer   . History of TIA (transient  ischemic attack)   . Acute systolic congestive heart failure ([place])   . Reactive hypertension   . Hypernatremia   . Leukocytosis   . Acute blood loss anemia   . Elevated serum creatinine   . Acute respiratory failure with hypoxia ([place])   . IVH (intraventricular hemorrhage) ([place]) 10/29/2017  . Hypoxia 10/28/2017  . Pancreatic lesion 05/24/2017  . 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 Nodule of upper lobe of left lung 06/04/2015  . 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 Cerebral thrombosis with cerebral infarction 04/03/2015  . Cancer of right colon ([place]) 03/28/2015  . CVA (cerebral infarction) 12/21/2014  . Polycythemia vera ([place]) 06/22/2006   Amy T Tomasita Morrow, OTR/L, CLT  Lovett,Amy 02/01/2018, 2:46 PM  [place] MAIN [facility] SERVICES [address] [place], [place], [address] Phone: [phone]   Fax:  [phone]  Name: JAZLEEN ROBECK MRN: [id number] Date of Birth: 02-Aug-1931

## 2018-02-01 NOTE — Therapy (Signed)
[place] MAIN [facility] SERVICES [address] [place], [place], [address] Phone: [phone]   Fax:  [phone]  Occupational Therapy Treatment  Patient Details  Name: [name] MRN: [id number] Date of Birth: 09-Dec-1931 Referring Provider: Charlett Blake   Encounter Date: 02/01/2018  OT End of Session - 02/01/18 1449    Visit Number  3    Number of Visits  24    Date for OT Re-Evaluation  04/14/18    Authorization Type  Medicare    Authorization Time Period  visit 3/10, reporting period starting 01/20/2018    OT Start Time  1430    Activity Tolerance  Patient tolerated treatment well    Behavior During Therapy  [facility] for tasks assessed/performed       Past Medical History:  Diagnosis Date  . Adenocarcinoma in situ of cervix   . Arthritis    hands  . Breast cancer ([place])   . Colon cancer ([place])   . Diabetes mellitus without complication ([place])   . Endometrial carcinoma (HCC)    s/p total abdominal hysterectomy  . H/O compression fracture of spine 2014   thoracic spine  . H/O polycythemia vera   . H/O TIA (transient ischemic attack) and stroke 09/2014, 03/2015   No deficits  . Hypertension   . Hyperuricemia   . Microalbuminuria   . Polycythemia vera ([place])   . Recurrent falls   . Skin cancer    face, legs  . Stroke [facility]) 2008   no deficits  . Trochanteric bursitis   . Varicose veins    treated    Past Surgical History:  Procedure Laterality Date  . ABDOMINAL HYSTERECTOMY    . BOWEL RESECTION N/A 03/28/2015   Procedure: SMALL BOWEL RESECTION;  Surgeon: Leonie Green, MD;  Location: [facility] ORS;  Service: General;  Laterality: N/A;  . CATARACT EXTRACTION W/ INTRAOCULAR LENS IMPLANT Right   . CATARACT EXTRACTION W/PHACO Left 10/07/2015   Procedure: CATARACT EXTRACTION PHACO AND INTRAOCULAR LENS PLACEMENT (IOC);  Surgeon: Ronnell Freshwater, MD;  Location: [place];  Service: Ophthalmology;  Laterality:  Left;  DIABETIC - oral meds VISION BLUE  . CORONARY ANGIOGRAPHY N/A 10/29/2017   Procedure: CORONARY ANGIOGRAPHY;  Surgeon: Dionisio David, MD;  Location: [place] CV LAB;  Service: Cardiovascular;  Laterality: N/A;  . EXPLORATORY LAPAROTOMY     for fibroids  . LEFT HEART CATH Right 10/29/2017   Procedure: Left Heart Cath;  Surgeon: Dionisio David, MD;  Location: [place] CV LAB;  Service: Cardiovascular;  Laterality: Right;  . TONSILLECTOMY      There were no vitals filed for this visit.  Subjective Assessment - 02/01/18 1448    Subjective   Patient reports she had her PT evaluation yesterday and it was difficult, reports she did a 6 minute walk test, not sure how she did but she was tired after session.     Pertinent History  84 year old female with adenocarcinoma of the cervix as well as endometrial carcinoma and polycythemia vera as well as diabetes who had hypertensive right cerebellar hemorrhage onset 10/28/2017.  She was treated initially at [facility] and then transferred to [facility].  Neurosurgical evaluation per Dr. Ronnald Ramp concluded no surgery was needed.  The patient completed inpatient rehabilitation at [facility] (including SLP) 11/04/2017-11/18/2107 and was discharged at a 24-hour supervision level.  The patient has hired caregiver to provide 24-hour supervision. Patient has received home health  rehab services, including SLP.     Patient Stated Goals  Patient would like to be independent in all tasks and be able to live alone again.    Currently in Pain?  No/denies    Pain Score  0-No pain          Patient seen for fine motor coordination skills and strengthening with removing 1/2 inch sized objects from green resistive putty. Cues for prehension patterns  And to use right hand for task.  Reviewed handwriting samples from last week and patient did not recognize her handwriting from last session, she could not read Any of the list besides one  item, when cued she was able to recall performing the task but was surprised she did not recognize her  Own handwriting.   Patient seen for finger strengthening with push pins to place into small bulletin board with mild resistance.  Cues for prehension patterns and turning pin to place Into board. Patient reports some difficulty with pushing pins in all the way.    Handwriting with red built up pen for writing name, she did not like it and felt it was too big.  Switched to smaller built up pen surface and patient did well with writing her  First and last name. When attempting a list of 10 fruits this session, she continued to demo poor legibility but slightly improved from last session. She was able to come up with 6 items,  2 of 6 items were somewhat legible.                    OT Education - 02/01/18 1449    Education Details  handwriting, fine motor coordination    Person(s) Educated  Patient    Methods  Explanation;Demonstration;Verbal cues    Comprehension  Verbalized understanding;Returned demonstration;Verbal cues required          OT Long Term Goals - 01/22/18 1121      OT LONG TERM GOAL #1   Title  Patient will complete bathing with modified independence    Baseline  moderate assist    Time  12    Period  Weeks    Status  New      OT LONG TERM GOAL #2   Title  Patient will complete dressing skills with modified independence    Baseline  moderate assist    Time  12    Period  Weeks    Status  New      OT LONG TERM GOAL #3   Title  Patient will complete light meal prep with min assist    Baseline  max assist    Time  12    Period  Weeks    Status  New      OT LONG TERM GOAL #4   Title  Patient will increase R grip strength by 5# to open jars and containers with modified independence    Baseline  difficulty at eval    Time  12    Period  Weeks    Status  New      OT LONG TERM GOAL #5   Title  Patient will improve strength by 1 mm grade RUE  to assist with obtaining items from the closet.     Baseline  4/5 overall RUE    Time  8    Period  Weeks    Status  New            Plan -  02/01/18 1450    Clinical Impression Statement  Patient continues to work towards increasing right hand strength and coordination.  She demonstrates hand fatigue after multiple repetitions of tasks and benefits from short rest breaks.  She continues to demonstrate poor legibility with handwriting and has difficulty recalling items for lists.  Will continue to work towards neuro reeducation of right dominant hand for necessary daily tasks.     Occupational Profile and client history currently impacting functional performance  family more than 30 mins away, has 24 hour caregiver assist, decreased awareness, memory and safety    Occupational performance deficits (Please refer to evaluation for details):  ADL's;IADL's;Social Participation    Rehab Potential  Good    Current Impairments/barriers affecting progress:  memory, requires 24 hour caregivers,     OT Frequency  2x / week    OT Duration  12 weeks    OT Treatment/Interventions  Self-care/ADL training;Cryotherapy;Therapeutic exercise;DME and/or AE instruction;Functional Mobility Training;Cognitive remediation/compensation;Balance training;Neuromuscular education;Manual Therapy;Moist Heat;Therapeutic activities;Patient/family education    Consulted and Agree with Plan of Care  Patient       Patient will benefit from skilled therapeutic intervention in order to improve the following deficits and impairments:  Decreased balance, Decreased mobility, Difficulty walking, Decreased cognition, Decreased activity tolerance, Decreased coordination, Decreased safety awareness, Decreased strength, Impaired UE functional use  Visit Diagnosis: Muscle weakness (generalized)  Other lack of coordination    Problem List Patient Active Problem List   Diagnosis Date Noted  . Cognitive deficit, post-stroke  12/31/2017  . Ataxia, post-stroke 12/31/2017  . Dysarthria, post-stroke   . Gait disturbance, post-stroke   . H/O cerebral parenchymal hemorrhage 11/04/2017  . Essential hypertension 11/04/2017  . Hyperlipidemia 11/04/2017  . Diabetes ([place]) 11/04/2017  . CAD (coronary artery disease) 11/04/2017  . 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 Hypokalemia 11/04/2017  . Right-sided nontraumatic intracerebral hemorrhage of cerebellum ([place])   . History of cervical cancer   . History of TIA (transient ischemic attack)   . Acute systolic congestive heart failure ([place])   . Reactive hypertension   . Hypernatremia   . Leukocytosis   . Acute blood loss anemia   . Elevated serum creatinine   . Acute respiratory failure with hypoxia ([place])   . IVH (intraventricular hemorrhage) ([place]) 10/29/2017  . Hypoxia 10/28/2017  . Pancreatic lesion 05/24/2017  . 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 Nodule of upper lobe of left lung 06/04/2015  . 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 Cerebral thrombosis with cerebral infarction 04/03/2015  . Cancer of right colon ([place]) 03/28/2015  . CVA (cerebral infarction) 12/21/2014  . Polycythemia vera ([place]) 06/22/2006    Dellis Voght 02/01/2018, 3:29 PM  [place] MAIN [facility] SERVICES [address] [place], [place], [address] Phone: [phone]   Fax:  [phone]  Name: RICKA WESTRA MRN: [id number] Date of Birth: 30-Jun-1931

## 2018-02-02 ENCOUNTER — Ambulatory Visit: Payer: Medicare Other | Admitting: Physical Therapy

## 2018-02-03 ENCOUNTER — Ambulatory Visit: Payer: Medicare Other | Admitting: Speech Pathology

## 2018-02-03 DIAGNOSIS — R41841 Cognitive communication deficit: Secondary | ICD-10-CM

## 2018-02-03 DIAGNOSIS — I69123 Fluency disorder following nontraumatic intracerebral hemorrhage: Secondary | ICD-10-CM

## 2018-02-03 DIAGNOSIS — R471 Dysarthria and anarthria: Secondary | ICD-10-CM

## 2018-02-03 DIAGNOSIS — M6281 Muscle weakness (generalized): Secondary | ICD-10-CM | POA: Diagnosis not present

## 2018-02-04 ENCOUNTER — Encounter: Payer: Self-pay | Admitting: Speech Pathology

## 2018-02-04 NOTE — Therapy (Signed)
[place] MAIN [facility] SERVICES [address] [place], [place], [address] Phone: [phone]   Fax:  [phone]  Speech Language Pathology Treatment  Patient Details  Name: [name] MRN: [id number] Date of Birth: 10-18-1931 Referring Provider: Charlett Blake    Encounter Date: 02/03/2018  End of Session - 02/04/18 0840    Visit Number  3    Number of Visits  17    Date for SLP Re-Evaluation  03/21/18    SLP Start Time  1300    SLP Stop Time   1350    SLP Time Calculation (min)  50 min    Activity Tolerance  Patient tolerated treatment well       Past Medical History:  Diagnosis Date  . Adenocarcinoma in situ of cervix   . Arthritis    hands  . Breast cancer ([place])   . Colon cancer ([place])   . Diabetes mellitus without complication ([place])   . Endometrial carcinoma (HCC)    s/p total abdominal hysterectomy  . H/O compression fracture of spine 2014   thoracic spine  . H/O polycythemia vera   . H/O TIA (transient ischemic attack) and stroke 09/2014, 03/2015   No deficits  . Hypertension   . Hyperuricemia   . Microalbuminuria   . Polycythemia vera ([place])   . Recurrent falls   . Skin cancer    face, legs  . Stroke [facility]) 2008   no deficits  . Trochanteric bursitis   . Varicose veins    treated    Past Surgical History:  Procedure Laterality Date  . ABDOMINAL HYSTERECTOMY    . BOWEL RESECTION N/A 03/28/2015   Procedure: SMALL BOWEL RESECTION;  Surgeon: Leonie Green, MD;  Location: [facility] ORS;  Service: General;  Laterality: N/A;  . CATARACT EXTRACTION W/ INTRAOCULAR LENS IMPLANT Right   . CATARACT EXTRACTION W/PHACO Left 10/07/2015   Procedure: CATARACT EXTRACTION PHACO AND INTRAOCULAR LENS PLACEMENT (IOC);  Surgeon: Ronnell Freshwater, MD;  Location: [place];  Service: Ophthalmology;  Laterality: Left;  DIABETIC - oral meds VISION BLUE  . CORONARY ANGIOGRAPHY N/A 10/29/2017   Procedure: CORONARY  ANGIOGRAPHY;  Surgeon: Dionisio David, MD;  Location: [place] CV LAB;  Service: Cardiovascular;  Laterality: N/A;  . EXPLORATORY LAPAROTOMY     for fibroids  . LEFT HEART CATH Right 10/29/2017   Procedure: Left Heart Cath;  Surgeon: Dionisio David, MD;  Location: [place] CV LAB;  Service: Cardiovascular;  Laterality: Right;  . TONSILLECTOMY      There were no vitals filed for this visit.  Subjective Assessment - 02/04/18 0839    Subjective  "I'm impatient"            ADULT SLP TREATMENT - 02/04/18 0001      General Information   Behavior/Cognition  Alert;Cooperative;Pleasant mood    HPI   84 year old female with adenocarcinoma of the cervix as well as endometrial carcinoma and polycythemia vera as well as diabetes who had hypertensive right cerebellar hemorrhage onset 10/28/2017.  She was treated initially at [facility] and then transferred to [facility].  Neurosurgical evaluation per Dr. Ronnald Ramp concluded no surgery was needed.  The patient completed inpatient rehabilitation at [facility] (including SLP) 11/04/2017-11/18/2107 and was discharged at a 24-hour supervision level.  The patient has hired caregiver to provide 24-hour supervision. Patient has received home health rehab services, including SLP.       Treatment Provided  Treatment provided  Cognitive-Linquistic      Pain Assessment   Pain Assessment  No/denies pain      Cognitive-Linquistic Treatment   Treatment focused on  Apraxia;Aphasia    Skilled Treatment  HANDWRITING: the patient is concerned regarding her handwriting.  Legibility improves with lined paper to encourage larger letters.  Patient returned with homework showing much improved legibility. Patient able to complete worksheet in session with 100% legibility.  FLUENCY: spontaneous speech is notable for significant episodes of dysfluency.  The patient is 100% fluent when reading words and simple sentences.  Patient is 70% fluency in  simple cognitive linguistic task.  WORD FINDING: Patient is able to complete simple word finding worksheet with 70% accuracy.      Assessment / Recommendations / Plan   Plan  Continue with current plan of care      Progression Toward Goals   Progression toward goals  Progressing toward goals       SLP Education - 02/04/18 0839    Education Details  slow speech to improve fluency    Person(s) Educated  Patient    Methods  Explanation    Comprehension  Verbalized understanding         SLP Long Term Goals - 01/19/18 1443      SLP LONG TERM GOAL #1   Title  Patient will generate fluent, grammatical, and cogent sentence to complete simple/concrete linguistic task with 80% accuracy.    Time  4    Period  Weeks    Status  New    Target Date  02/18/18      SLP LONG TERM GOAL #2   Title  Patient will generate grammatical, fluent, and cogent sentences to complete abstract/complex linguistic task with 80% accuracy.    Time  8    Period  Weeks    Status  New    Target Date  03/21/18      SLP LONG TERM GOAL #3   Title  Patient will identify cognitive-communication barriers and participate in developing functional compensatory strategies.    Time  8    Period  Weeks    Status  New    Target Date  03/21/18      SLP LONG TERM GOAL #4   Title  Patient will complete moderately complex executive function skills tasks with 80% accuracy.    Time  8    Period  Weeks    Status  New    Target Date  03/21/18       Plan - 02/04/18 0840    Clinical Impression Statement  The patient is most fluent under very controlled setting, such as reading aloud.  Her fluency decreases as the cognitive load of the task increases.    Speech Therapy Frequency  2x / week    Duration  Other (comment)    Treatment/Interventions  Compensatory strategies;Patient/family education;Cognitive reorganization;Language facilitation;Internal/external aids    Potential to Achieve Goals  Good    Potential  Considerations  Ability to learn/carryover information;Pain level;Family/community support;Co-morbidities;Previous level of function;Cooperation/participation level;Severity of impairments;Medical prognosis    SLP Home Exercise Plan  writing, word finding work Financial trader and Agree with Plan of Care  Patient       Patient will benefit from skilled therapeutic intervention in order to improve the following deficits and impairments:   Dysarthria  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 Cognitive deficit, post-stroke 12/31/2017  . Ataxia, post-stroke 12/31/2017  . Dysarthria, post-stroke   . Gait disturbance, post-stroke   . H/O cerebral parenchymal hemorrhage 11/04/2017  . Essential hypertension 11/04/2017  . Hyperlipidemia 11/04/2017  . Diabetes ([place]) 11/04/2017  . CAD (coronary artery disease) 11/04/2017  . 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 Hypokalemia 11/04/2017  . 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 History of cervical cancer   . History of TIA (transient ischemic attack)   . Acute systolic congestive heart failure ([place])   . Reactive hypertension   . Hypernatremia   . Leukocytosis   . Acute blood loss anemia   . Elevated serum creatinine   . Acute respiratory failure with hypoxia ([place])   . IVH (intraventricular hemorrhage) ([place]) 10/29/2017  . Hypoxia 10/28/2017  . Pancreatic lesion 05/24/2017  . 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 Nodule of upper lobe of left lung 06/04/2015  . 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 Cerebral thrombosis with cerebral infarction 04/03/2015  . Cancer of right colon ([place]) 03/28/2015  . CVA (cerebral infarction) 12/21/2014  . Polycythemia vera ([place]) 06/22/2006   Leroy Sea, MS/CCC- SLP  Lou Miner 02/04/2018,  8:41 AM  [place] MAIN [facility] SERVICES [address] [place], [place], [address] Phone: [phone]   Fax:  [phone]   Name: DESHAUNA CAYSON MRN: [id number] Date of Birth: 07/26/1931

## 2018-02-08 ENCOUNTER — Ambulatory Visit: Payer: Medicare Other | Admitting: Speech Pathology

## 2018-02-08 DIAGNOSIS — M6281 Muscle weakness (generalized): Secondary | ICD-10-CM | POA: Diagnosis not present

## 2018-02-08 DIAGNOSIS — R41841 Cognitive communication deficit: Secondary | ICD-10-CM

## 2018-02-08 DIAGNOSIS — R471 Dysarthria and anarthria: Secondary | ICD-10-CM

## 2018-02-08 DIAGNOSIS — I69123 Fluency disorder following nontraumatic intracerebral hemorrhage: Secondary | ICD-10-CM

## 2018-02-09 ENCOUNTER — Encounter: Payer: Self-pay | Admitting: Speech Pathology

## 2018-02-09 NOTE — Therapy (Signed)
[place] MAIN [facility] SERVICES [address] [place], [place], [address] Phone: [phone]   Fax:  [phone]  Speech Language Pathology Treatment  Patient Details  Name: [name] MRN: [id number] Date of Birth: January 09, 1932 Referring Provider: Charlett Blake    Encounter Date: 02/08/2018  End of Session - 02/09/18 0955    Visit Number  4    Number of Visits  17    Date for [name] Re-Evaluation  03/21/18    [name] Start Time  1400    [name] Stop Time   1455    [name] Time Calculation (min)  55 min    Activity Tolerance  Patient tolerated treatment well       Past Medical History:  Diagnosis Date  . Adenocarcinoma in situ of cervix   . Arthritis    hands  . Breast cancer ([place])   . Colon cancer ([place])   . Diabetes mellitus without complication ([place])   . Endometrial carcinoma (HCC)    s/p total abdominal hysterectomy  . H/O compression fracture of spine 2014   thoracic spine  . H/O polycythemia vera   . H/O TIA (transient ischemic attack) and stroke 09/2014, 03/2015   No deficits  . Hypertension   . Hyperuricemia   . Microalbuminuria   . Polycythemia vera ([place])   . Recurrent falls   . Skin cancer    face, legs  . Stroke [facility]) 2008   no deficits  . Trochanteric bursitis   . Varicose veins    treated    Past Surgical History:  Procedure Laterality Date  . ABDOMINAL HYSTERECTOMY    . BOWEL RESECTION N/A 03/28/2015   Procedure: SMALL BOWEL RESECTION;  Surgeon: Leonie Green, MD;  Location: [facility] ORS;  Service: General;  Laterality: N/A;  . CATARACT EXTRACTION W/ INTRAOCULAR LENS IMPLANT Right   . CATARACT EXTRACTION W/PHACO Left 10/07/2015   Procedure: CATARACT EXTRACTION PHACO AND INTRAOCULAR LENS PLACEMENT (IOC);  Surgeon: Ronnell Freshwater, MD;  Location: [place];  Service: Ophthalmology;  Laterality: Left;  DIABETIC - oral meds VISION BLUE  . CORONARY ANGIOGRAPHY N/A 10/29/2017   Procedure: CORONARY  ANGIOGRAPHY;  Surgeon: Dionisio David, MD;  Location: [place] CV LAB;  Service: Cardiovascular;  Laterality: N/A;  . EXPLORATORY LAPAROTOMY     for fibroids  . LEFT HEART CATH Right 10/29/2017   Procedure: Left Heart Cath;  Surgeon: Dionisio David, MD;  Location: [place] CV LAB;  Service: Cardiovascular;  Laterality: Right;  . TONSILLECTOMY      There were no vitals filed for this visit.  Subjective Assessment - 02/09/18 0954    Subjective  "I'm impatient"            ADULT [name] TREATMENT - 02/09/18 0001      General Information   Behavior/Cognition  Alert;Cooperative;Pleasant mood    HPI   84 year old female with adenocarcinoma of the cervix as well as endometrial carcinoma and polycythemia vera as well as diabetes who had hypertensive right cerebellar hemorrhage onset 10/28/2017.  She was treated initially at [facility] and then transferred to [facility].  Neurosurgical evaluation per Dr. Ronnald Ramp concluded no surgery was needed.  The patient completed inpatient rehabilitation at [facility] (including [name]) 11/04/2017-11/18/2107 and was discharged at a 24-hour supervision level.  The patient has hired caregiver to provide 24-hour supervision. Patient has received home health rehab services, including [name].       Treatment Provided  Treatment provided  Cognitive-Linquistic      Pain Assessment   Pain Assessment  No/denies pain      Cognitive-Linquistic Treatment   Treatment focused on  Apraxia;Aphasia    Skilled Treatment  HANDWRITING: the patient is concerned regarding her handwriting.  Legibility improves with lined paper to encourage larger letters.  Patient returned with homework showing much improved legibility. Patient able to complete worksheet in session with 100% legibility.  FLUENCY: spontaneous speech is notable for significant episodes of dysfluency.  The patient is 100% fluent when reading words and simple sentences.  Patient is 85% fluency in  simple cognitive linguistic task and 75% fluent in more abstract cognitive linguistic task.  WORD FINDING: Patient is able to complete simple word finding worksheet with 90% accuracy.      Assessment / Recommendations / Plan   Plan  Continue with current plan of care      Progression Toward Goals   Progression toward goals  Progressing toward goals       [name] Education - 02/09/18 0954    Education Details  slow speech to improve fluency    Person(s) Educated  Patient    Methods  Explanation    Comprehension  Verbalized understanding         [name] Long Term Goals - 01/19/18 1443      [name] LONG TERM GOAL #1   Title  Patient will generate fluent, grammatical, and cogent sentence to complete simple/concrete linguistic task with 80% accuracy.    Time  4    Period  Weeks    Status  New    Target Date  02/18/18      [name] LONG TERM GOAL #2   Title  Patient will generate grammatical, fluent, and cogent sentences to complete abstract/complex linguistic task with 80% accuracy.    Time  8    Period  Weeks    Status  New    Target Date  03/21/18      [name] LONG TERM GOAL #3   Title  Patient will identify cognitive-communication barriers and participate in developing functional compensatory strategies.    Time  8    Period  Weeks    Status  New    Target Date  03/21/18      [name] LONG TERM GOAL #4   Title  Patient will complete moderately complex executive function skills tasks with 80% accuracy.    Time  8    Period  Weeks    Status  New    Target Date  03/21/18       Plan - 02/09/18 0955    Clinical Impression Statement  The patient is most fluent under very controlled setting, such as reading aloud.  Her fluency decreases as the cognitive load of the task increases.    Speech Therapy Frequency  2x / week    Duration  Other (comment)    Treatment/Interventions  Compensatory strategies;Patient/family education;Cognitive reorganization;Language facilitation;Internal/external aids     Potential to Achieve Goals  Good    Potential Considerations  Ability to learn/carryover information;Pain level;Family/community support;Co-morbidities;Previous level of function;Cooperation/participation level;Severity of impairments;Medical prognosis    [name] Home Exercise Plan  writing, word finding work Financial trader and Agree with Plan of Care  Patient       Patient will benefit from skilled therapeutic intervention in order to improve the following deficits and impairments:   Dysarthria  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 Cognitive deficit, post-stroke 12/31/2017  . Ataxia, post-stroke 12/31/2017  . Dysarthria, post-stroke   . Gait disturbance, post-stroke   . H/O cerebral parenchymal hemorrhage 11/04/2017  . Essential hypertension 11/04/2017  . Hyperlipidemia 11/04/2017  . Diabetes ([place]) 11/04/2017  . CAD (coronary artery disease) 11/04/2017  . 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 Hypokalemia 11/04/2017  . Right-sided nontraumatic intracerebral hemorrhage of cerebellum ([place])   . History of cervical cancer   . History of TIA (transient ischemic attack)   . Acute systolic congestive heart failure ([place])   . Reactive hypertension   . Hypernatremia   . Leukocytosis   . Acute blood loss anemia   . Elevated serum creatinine   . Acute respiratory failure with hypoxia ([place])   . IVH (intraventricular hemorrhage) ([place]) 10/29/2017  . Hypoxia 10/28/2017  . Pancreatic lesion 05/24/2017  . 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 Nodule of upper lobe of left lung 06/04/2015  . 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 Cerebral thrombosis with cerebral infarction 04/03/2015  . Cancer of right colon ([place]) 03/28/2015  . CVA (cerebral infarction) 12/21/2014  . Polycythemia vera ([place]) 06/22/2006   [name], [name]  [name] 02/09/2018, 9:56 AM  [place] MAIN [facility] SERVICES [address] [place], [place], [address] Phone: [phone]   Fax:  [phone]   Name: [name] MRN: [id number] Date of Birth: [birth year]

## 2018-02-10 ENCOUNTER — Encounter: Payer: Self-pay | Admitting: Physical Therapy

## 2018-02-10 ENCOUNTER — Encounter: Payer: Self-pay | Admitting: Speech Pathology

## 2018-02-10 ENCOUNTER — Ambulatory Visit: Payer: Medicare Other | Admitting: Physical Therapy

## 2018-02-10 ENCOUNTER — Ambulatory Visit: Payer: Medicare Other | Admitting: Speech Pathology

## 2018-02-10 DIAGNOSIS — I69123 Fluency disorder following nontraumatic intracerebral hemorrhage: Secondary | ICD-10-CM

## 2018-02-10 DIAGNOSIS — M6281 Muscle weakness (generalized): Secondary | ICD-10-CM | POA: Diagnosis not present

## 2018-02-10 DIAGNOSIS — R278 Other lack of coordination: Secondary | ICD-10-CM

## 2018-02-10 DIAGNOSIS — R471 Dysarthria and anarthria: Secondary | ICD-10-CM

## 2018-02-10 DIAGNOSIS — R262 Difficulty in walking, not elsewhere classified: Secondary | ICD-10-CM

## 2018-02-10 DIAGNOSIS — R41841 Cognitive communication deficit: Secondary | ICD-10-CM

## 2018-02-10 NOTE — Therapy (Signed)
[place] MAIN [facility] SERVICES [address] [place], [place], [address] Phone: [phone]   Fax:  [phone]  Physical Therapy Treatment  Patient Details  Name: April [name] MRN: [id number] Date of Birth: 11/13/1931 Referring Provider: Charlett Blake   Encounter Date: 02/10/2018  PT End of Session - 02/10/18 1331    Visit Number  2    Number of Visits  25    Date for PT Re-Evaluation  04/25/18    Authorization Type  2/10 with [facility] 01/31/18    PT Start Time  1345    PT Stop Time  1431    PT Time Calculation (min)  46 min    Equipment Utilized During Treatment  Gait belt    Activity Tolerance  Patient tolerated treatment well    Behavior During Therapy  [facility] for tasks assessed/performed       Past Medical History:  Diagnosis Date  . Adenocarcinoma in situ of cervix   . Arthritis    hands  . Breast cancer ([place])   . Colon cancer ([place])   . Diabetes mellitus without complication ([place])   . Endometrial carcinoma (HCC)    s/p total abdominal hysterectomy  . H/O compression fracture of spine 2014   thoracic spine  . H/O polycythemia vera   . H/O TIA (transient ischemic attack) and stroke 09/2014, 03/2015   No deficits  . Hypertension   . Hyperuricemia   . Microalbuminuria   . Polycythemia vera ([place])   . Recurrent falls   . Skin cancer    face, legs  . Stroke [facility]) 2008   no deficits  . Trochanteric bursitis   . Varicose veins    treated    Past Surgical History:  Procedure Laterality Date  . ABDOMINAL HYSTERECTOMY    . BOWEL RESECTION N/A 03/28/2015   Procedure: SMALL BOWEL RESECTION;  Surgeon: Leonie Green, MD;  Location: [facility] ORS;  Service: General;  Laterality: N/A;  . CATARACT EXTRACTION W/ INTRAOCULAR LENS IMPLANT Right   . CATARACT EXTRACTION W/PHACO Left 10/07/2015   Procedure: CATARACT EXTRACTION PHACO AND INTRAOCULAR LENS PLACEMENT (IOC);  Surgeon: Ronnell Freshwater, MD;  Location: [place];  Service: Ophthalmology;  Laterality: Left;  DIABETIC - oral meds VISION BLUE  . CORONARY ANGIOGRAPHY N/A 10/29/2017   Procedure: CORONARY ANGIOGRAPHY;  Surgeon: Dionisio David, MD;  Location: [place] CV LAB;  Service: Cardiovascular;  Laterality: N/A;  . EXPLORATORY LAPAROTOMY     for fibroids  . LEFT HEART CATH Right 10/29/2017   Procedure: Left Heart Cath;  Surgeon: Dionisio David, MD;  Location: [place] CV LAB;  Service: Cardiovascular;  Laterality: Right;  . TONSILLECTOMY      There were no vitals filed for this visit.  Subjective Assessment - 02/10/18 1331    Subjective  Patient reports doing well; denies any pain; denies any falls; She reports, "I get tired real easy."     Pertinent History    84 year old female with adenocarcinoma of the cervix as well as endometrial carcinoma and polycythemia vera as well as diabetes who had hypertensive right cerebellar hemorrhage onset 10/28/2017.  She was treated initially at [facility] and then transferred to [facility].  Neurosurgical evaluation per Dr. Ronnald Ramp concluded no surgery was needed.  The patient completed inpatient rehabilitation at [facility] (including SLP) 11/04/2017-11/18/2107 and was discharged at a 24-hour supervision level.  The patient has hired caregiver to provide 24-hour supervision.  Patient has received home health rehab services, including SLP.    Limitations  Standing;Walking    How long can you walk comfortably?  a few minutes    Patient Stated Goals  Patient wants to walk better and not need the RW.     Currently in Pain?  No/denies    Multiple Pain Sites  No          TREATMENT: Warm up on Nustep BUE/BLE level 2 x3 min (Unbilled);  Instructed patient in LE strengthening as part of HEP: Seated with green tband around BLE: -hip flexion march x15 bilaterally; -hip abduction x15 bilaterally; -LAQ x15 bilaterally; -ankle DF x15 bilaterally; Patient required min-moderate  verbal/tactile cues for correct exercise technique with cues for tband placement and to increase ROM for better strengthening;   Standing in parallel bars: Standing on airex: -toe taps to 4 inch step with 2-1 rail assist x15 reps bilaterally; -standing one foot on step, Alternate BUE ball pass side/side x5 reps each foot on step; -mini squat unsupported x10 reps; -heel/toe raises x15 reps with B rail assist for balance; -Alternate march x15 bilaterally with cues to increase hip flexion for better ROM/strength; -staggered stance, head turns side/side, up/down x5 reps each, each foot in front;    Patient tolerated session well; denies any pain or fatigue at end of session;                     PT Education - 02/10/18 1331    Education Details  HEP, exercise, balance    Person(s) Educated  Patient    Methods  Explanation;Verbal cues    Comprehension  Verbalized understanding;Returned demonstration;Verbal cues required;Need further instruction       PT Short Term Goals - 01/31/18 1120      PT SHORT TERM GOAL #1   Title  Patient [name] reduce timed up and go to <11 seconds to reduce fall risk and demonstrate improved transfer/gait ability.    Baseline  22.07 sec    Time  6    Period  Weeks    Status  New    Target Date  02/28/18      PT SHORT TERM GOAL #2   Title  Patient [name] be independent with ascend/descend 2 steps using single UE in step over step pattern without LOB.    Time  6    Period  Weeks    Status  New    Target Date  02/28/18      PT SHORT TERM GOAL #3   Time  6    Target Date  03/14/18        PT Long Term Goals - 01/31/18 1119      PT LONG TERM GOAL #1   Title  Patient [name] be independent in home exercise program to improve strength/mobility for better functional independence with ADLs.    Time  12    Period  Weeks    Status  New    Target Date  04/25/18      PT LONG TERM GOAL #2   Title  Patient (> 19 years old) [name] complete five times  sit to stand test in < 15 seconds indicating an increased LE strength and improved balance    Baseline  34.21 sec    Time  12    Period  Weeks    Status  New    Target Date  04/25/18      PT LONG TERM GOAL #3  Title  Patient [name] increase six minute walk test distance to >1000 for progression to community ambulator and improve gait ability    Baseline  550 feet    Time  12    Period  Weeks    Status  New    Target Date  04/25/18      PT LONG TERM GOAL #4   Title  Patient [name] increase 10 meter walk test to >1.11m/s as to improve gait speed for better community ambulation and to reduce fall risk    Baseline  .49 m/sec    Time  12    Period  Weeks    Status  New    Target Date  04/25/18            Plan - 02/10/18 1359    Clinical Impression Statement  Patient tolerated session well; She was instructed in LE strengthening exercise for HEP. Patient reuqired min VCs for correct exercise technique including to increase ROM and slow down LE movement for better strengthening; Patient also instructed in balance activities. She does require rail assist especially when standing on compliant surfaces. She would benefit from additional skilled PT intervention to improve strength, balance and gait safety;     Rehab Potential  Fair    PT Frequency  2x / week    PT Duration  12 weeks    PT Treatment/Interventions  Patient/family education;Neuromuscular re-education;Balance training;Stair training;Therapeutic activities;Therapeutic exercise;Gait training;Aquatic Therapy    PT Next Visit Plan  balance and strengthening    PT Home Exercise Plan  unable to provide due to short Evaluation session scheduled    Consulted and Agree with Plan of Care  Patient;Family member/caregiver       Patient [name] benefit from skilled therapeutic intervention in order to improve the following deficits and impairments:  Abnormal gait, Decreased balance, Decreased endurance, Decreased mobility, Impaired  flexibility, Decreased strength, Decreased knowledge of use of DME, Decreased activity tolerance, Difficulty walking  Visit Diagnosis: Muscle weakness (generalized)  Other lack of coordination  Difficulty in walking, not elsewhere classified     Problem List Patient Active Problem List   Diagnosis Date Noted  . Cognitive deficit, post-stroke 12/31/2017  . Ataxia, post-stroke 12/31/2017  . Dysarthria, post-stroke   . Gait disturbance, post-stroke   . H/O cerebral parenchymal hemorrhage 11/04/2017  . Essential hypertension 11/04/2017  . Hyperlipidemia 11/04/2017  . Diabetes ([place]) 11/04/2017  . CAD (coronary artery disease) 11/04/2017  . 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 Hypokalemia 11/04/2017  . 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 History of cervical cancer   . History of TIA (transient ischemic attack)   . Acute systolic congestive heart failure ([place])   . Reactive hypertension   . Hypernatremia   . Leukocytosis   . Acute blood loss anemia   . Elevated serum creatinine   . Acute respiratory failure with hypoxia ([place])   . IVH (intraventricular hemorrhage) ([place]) 10/29/2017  . Hypoxia 10/28/2017  . Pancreatic lesion 05/24/2017  . 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 Nodule of upper lobe of left lung 06/04/2015  . 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 Cerebral thrombosis with cerebral infarction 04/03/2015  . Cancer of right colon ([place]) 03/28/2015  . CVA (cerebral infarction) 12/21/2014  . Polycythemia vera ([place]) 06/22/2006    Trotter,Margaret PT, DPT 02/10/2018, 2:03 PM  [place] MAIN [facility] SERVICES [address] [place], [place], [address] Phone:  [phone]   Fax:  [phone]  Name: April [name] MRN: [id number] Date of Birth: Jul 05, 1931

## 2018-02-10 NOTE — Therapy (Signed)
[place] MAIN [facility] SERVICES [address] [place], [place], [address] Phone: [phone]   Fax:  [phone]  Speech Language Pathology Treatment  Patient Details  Name: [name] MRN: [id number] Date of Birth: August 28, 1931 Referring Provider: Charlett Blake    Encounter Date: 02/10/2018  End of Session - 02/10/18 1458    Visit Number  5    Number of Visits  17    Date for SLP Re-Evaluation  03/21/18    SLP Start Time  1300    SLP Stop Time   1345    SLP Time Calculation (min)  45 min    Activity Tolerance  Patient tolerated treatment well       Past Medical History:  Diagnosis Date  . Adenocarcinoma in situ of cervix   . Arthritis    hands  . Breast cancer ([place])   . Colon cancer ([place])   . Diabetes mellitus without complication ([place])   . Endometrial carcinoma (HCC)    s/p total abdominal hysterectomy  . H/O compression fracture of spine 2014   thoracic spine  . H/O polycythemia vera   . H/O TIA (transient ischemic attack) and stroke 09/2014, 03/2015   No deficits  . Hypertension   . Hyperuricemia   . Microalbuminuria   . Polycythemia vera ([place])   . Recurrent falls   . Skin cancer    face, legs  . Stroke [facility]) 2008   no deficits  . Trochanteric bursitis   . Varicose veins    treated    Past Surgical History:  Procedure Laterality Date  . ABDOMINAL HYSTERECTOMY    . BOWEL RESECTION N/A 03/28/2015   Procedure: SMALL BOWEL RESECTION;  Surgeon: Leonie Green, MD;  Location: [facility] ORS;  Service: General;  Laterality: N/A;  . CATARACT EXTRACTION W/ INTRAOCULAR LENS IMPLANT Right   . CATARACT EXTRACTION W/PHACO Left 10/07/2015   Procedure: CATARACT EXTRACTION PHACO AND INTRAOCULAR LENS PLACEMENT (IOC);  Surgeon: Ronnell Freshwater, MD;  Location: [place];  Service: Ophthalmology;  Laterality: Left;  DIABETIC - oral meds VISION BLUE  . CORONARY ANGIOGRAPHY N/A 10/29/2017   Procedure: CORONARY  ANGIOGRAPHY;  Surgeon: Dionisio David, MD;  Location: [place] CV LAB;  Service: Cardiovascular;  Laterality: N/A;  . EXPLORATORY LAPAROTOMY     for fibroids  . LEFT HEART CATH Right 10/29/2017   Procedure: Left Heart Cath;  Surgeon: Dionisio David, MD;  Location: [place] CV LAB;  Service: Cardiovascular;  Laterality: Right;  . TONSILLECTOMY      There were no vitals filed for this visit.  Subjective Assessment - 02/10/18 1457    Subjective  "I'm impatient"            ADULT SLP TREATMENT - 02/10/18 0001      General Information   Behavior/Cognition  Alert;Cooperative;Pleasant mood    HPI   84 year old female with adenocarcinoma of the cervix as well as endometrial carcinoma and polycythemia vera as well as diabetes who had hypertensive right cerebellar hemorrhage onset 10/28/2017.  She was treated initially at [facility] and then transferred to [facility].  Neurosurgical evaluation per Dr. Ronnald Ramp concluded no surgery was needed.  The patient completed inpatient rehabilitation at [facility] (including SLP) 11/04/2017-11/18/2107 and was discharged at a 24-hour supervision level.  The patient has hired caregiver to provide 24-hour supervision. Patient has received home health rehab services, including SLP.       Treatment Provided  Treatment provided  Cognitive-Linquistic      Pain Assessment   Pain Assessment  No/denies pain      Cognitive-Linquistic Treatment   Treatment focused on  Apraxia;Aphasia    Skilled Treatment  HANDWRITING: the patient is concerned regarding her handwriting.  Legibility improves with lined paper to encourage larger letters.  Patient returned with homework showing much improved legibility. Patient able to complete worksheet in session with 100% legibility.  FLUENCY: spontaneous speech is notable for significant episodes of dysfluency.  The patient is 100% fluent when reading words and simple sentences.  Patient is 85% fluency in  simple cognitive linguistic task and 75% fluent in more abstract cognitive linguistic task.  WORD FINDING: Patient is able to complete simple word finding worksheet with 90% accuracy.      Assessment / Recommendations / Plan   Plan  Continue with current plan of care      Progression Toward Goals   Progression toward goals  Progressing toward goals       SLP Education - 02/10/18 1457    Education Details  slow speech and think before speaking to improve fluency    Person(s) Educated  Patient    Methods  Explanation    Comprehension  Verbalized understanding         SLP Long Term Goals - 01/19/18 1443      SLP LONG TERM GOAL #1   Title  Patient will generate fluent, grammatical, and cogent sentence to complete simple/concrete linguistic task with 80% accuracy.    Time  4    Period  Weeks    Status  New    Target Date  02/18/18      SLP LONG TERM GOAL #2   Title  Patient will generate grammatical, fluent, and cogent sentences to complete abstract/complex linguistic task with 80% accuracy.    Time  8    Period  Weeks    Status  New    Target Date  03/21/18      SLP LONG TERM GOAL #3   Title  Patient will identify cognitive-communication barriers and participate in developing functional compensatory strategies.    Time  8    Period  Weeks    Status  New    Target Date  03/21/18      SLP LONG TERM GOAL #4   Title  Patient will complete moderately complex executive function skills tasks with 80% accuracy.    Time  8    Period  Weeks    Status  New    Target Date  03/21/18       Plan - 02/10/18 1458    Clinical Impression Statement  The patient is most fluent under very controlled setting, such as reading aloud.  Her fluency decreases as the cognitive load of the task increases.    Speech Therapy Frequency  2x / week    Duration  Other (comment)    Treatment/Interventions  Compensatory strategies;Patient/family education;Cognitive reorganization;Language  facilitation;Internal/external aids    Potential to Achieve Goals  Good    Potential Considerations  Ability to learn/carryover information;Pain level;Family/community support;Co-morbidities;Previous level of function;Cooperation/participation level;Severity of impairments;Medical prognosis    SLP Home Exercise Plan  writing, word finding work Financial trader and Agree with Plan of Care  Patient       Patient will benefit from skilled therapeutic intervention in order to improve the following deficits and impairments:   Dysarthria  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 Cognitive deficit, post-stroke 12/31/2017  . Ataxia, post-stroke 12/31/2017  . Dysarthria, post-stroke   . Gait disturbance, post-stroke   . H/O cerebral parenchymal hemorrhage 11/04/2017  . Essential hypertension 11/04/2017  . Hyperlipidemia 11/04/2017  . Diabetes ([place]) 11/04/2017  . CAD (coronary artery disease) 11/04/2017  . 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 Hypokalemia 11/04/2017  . Right-sided nontraumatic intracerebral hemorrhage of cerebellum ([place])   . History of cervical cancer   . History of TIA (transient ischemic attack)   . Acute systolic congestive heart failure ([place])   . Reactive hypertension   . Hypernatremia   . Leukocytosis   . Acute blood loss anemia   . Elevated serum creatinine   . Acute respiratory failure with hypoxia ([place])   . IVH (intraventricular hemorrhage) ([place]) 10/29/2017  . Hypoxia 10/28/2017  . Pancreatic lesion 05/24/2017  . 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 Nodule of upper lobe of left lung 06/04/2015  . 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 Cerebral thrombosis with cerebral infarction 04/03/2015  . Cancer of right colon ([place]) 03/28/2015  . CVA (cerebral infarction) 12/21/2014  . Polycythemia  vera ([place]) 06/22/2006   Leroy Sea, MS/CCC- SLP  Lou Miner 02/10/2018, 2:59 PM  [place] MAIN [facility] SERVICES [address] [place], [place], [address] Phone: [phone]   Fax:  [phone]   Name: KEALANI LECKEY MRN: [id number] Date of Birth: 1932-05-06

## 2018-02-10 NOTE — Patient Instructions (Signed)
ABDUCTION: Sitting - Exercise Ball: Resistance Band (Active)   Sit with feet flat. With band tied around both legs, Lift right leg slightly and, against resistance band, draw it out to side. Complete __2_ sets of __10_ repetitions. Perform _2__ sessions per day.  Copyright  VHI. All rights reserved.  FLEXION: Sitting - Resistance Band (Active)   Sit, both feet flat. Have band tied around both legs above knees, lift right knee toward ceiling.Repeat with other knee Complete _2__ sets of _10__ repetitions. Perform _2__ sessions per day.  http://gtsc.exer.us/21   Knee Extension: Resisted (Sitting)   With band looped around right ankle and under other foot, straighten leg with ankle loop. Keep other leg bent to increase resistance. Repeat _10___ times per set. Do __2__ sets per session. Do _2___ sessions per day.  http://orth.exer.us/691   Copyright  VHI. All rights reserved.   Copyright  VHI. All rights reserved.  Copyright  VHI. All rights reserved.  FLEXION: Sitting - Resistance Band (Active)   Sit with right foot flat. Have band tied around both feet, bend ankle, bringing toes toward head. Complete __2_ sets of __10_ repetitions. Perform _2__ sessions per day.  Copyright  VHI. All rights reserved.  Toe / Heel Raise (Sitting)   Sitting, raise heels, then rock back on heels and raise toes. Repeat _10___ times.  Copyright  VHI. All rights reserved.  HIP / KNEE: Extension - Sit to Stand   Sitting, lean chest forward, raise hips up from surface. Straighten hips and knees. Weight bear equally on left and right sides. Backs of legs should not push off surface. __10_ reps per set, __2_ sets per day, _5__ days per week Use assistive device as needed.

## 2018-02-15 ENCOUNTER — Ambulatory Visit: Payer: Medicare Other | Admitting: Physical Therapy

## 2018-02-15 ENCOUNTER — Encounter: Payer: Self-pay | Admitting: Physical Therapy

## 2018-02-15 ENCOUNTER — Encounter: Payer: Self-pay | Admitting: Occupational Therapy

## 2018-02-15 ENCOUNTER — Ambulatory Visit: Payer: Medicare Other | Admitting: Occupational Therapy

## 2018-02-15 DIAGNOSIS — R278 Other lack of coordination: Secondary | ICD-10-CM

## 2018-02-15 DIAGNOSIS — R262 Difficulty in walking, not elsewhere classified: Secondary | ICD-10-CM

## 2018-02-15 DIAGNOSIS — M6281 Muscle weakness (generalized): Secondary | ICD-10-CM

## 2018-02-15 NOTE — Therapy (Deleted)
[place] MAIN [facility] SERVICES [address] [place], [place], [address] Phone: [phone]   Fax:  [phone]  Patient Details  Name: [name] MRN: [id number] Date of Birth: 01-20-1932 Referring Provider:  Charlett Blake, MD  Encounter Date: 02/15/2018   [name] 02/15/2018, 11:11 AM  [place] MAIN [facility] SERVICES [address] [place], [place], [address] Phone: [phone]   Fax:  [phone]

## 2018-02-15 NOTE — Therapy (Signed)
[place] MAIN [facility] SERVICES [address] [place], [place], [address] Phone: [phone]   Fax:  [phone]  Physical Therapy Treatment  Patient Details  Name: [name] MRN: [id number] Date of Birth: 05/13/1932 Referring Provider: Charlett Blake   Encounter Date: 02/15/2018  PT End of Session - 02/15/18 1108    Visit Number  3    Number of Visits  25    Date for PT Re-Evaluation  04/25/18    Authorization Type  3/10 with [facility] 01/31/18    PT Start Time  1105    PT Stop Time  1145    PT Time Calculation (min)  40 min    Equipment Utilized During Treatment  Gait belt    Activity Tolerance  Patient tolerated treatment well    Behavior During Therapy  [facility] for tasks assessed/performed       Past Medical History:  Diagnosis Date  . Adenocarcinoma in situ of cervix   . Arthritis    hands  . Breast cancer ([place])   . Colon cancer ([place])   . Diabetes mellitus without complication ([place])   . Endometrial carcinoma (HCC)    s/p total abdominal hysterectomy  . H/O compression fracture of spine 2014   thoracic spine  . H/O polycythemia vera   . H/O TIA (transient ischemic attack) and stroke 09/2014, 03/2015   No deficits  . Hypertension   . Hyperuricemia   . Microalbuminuria   . Polycythemia vera ([place])   . Recurrent falls   . Skin cancer    face, legs  . Stroke [facility]) 2008   no deficits  . Trochanteric bursitis   . Varicose veins    treated    Past Surgical History:  Procedure Laterality Date  . ABDOMINAL HYSTERECTOMY    . BOWEL RESECTION N/A 03/28/2015   Procedure: SMALL BOWEL RESECTION;  Surgeon: Leonie Green, MD;  Location: [facility] ORS;  Service: General;  Laterality: N/A;  . CATARACT EXTRACTION W/ INTRAOCULAR LENS IMPLANT Right   . CATARACT EXTRACTION W/PHACO Left 10/07/2015   Procedure: CATARACT EXTRACTION PHACO AND INTRAOCULAR LENS PLACEMENT (IOC);  Surgeon: Ronnell Freshwater, MD;  Location: [place];  Service: Ophthalmology;  Laterality: Left;  DIABETIC - oral meds VISION BLUE  . CORONARY ANGIOGRAPHY N/A 10/29/2017   Procedure: CORONARY ANGIOGRAPHY;  Surgeon: Dionisio David, MD;  Location: [place] CV LAB;  Service: Cardiovascular;  Laterality: N/A;  . EXPLORATORY LAPAROTOMY     for fibroids  . LEFT HEART CATH Right 10/29/2017   Procedure: Left Heart Cath;  Surgeon: Dionisio David, MD;  Location: [place] CV LAB;  Service: Cardiovascular;  Laterality: Right;  . TONSILLECTOMY      There were no vitals filed for this visit.    Review HEP: TM walking 1.2 miles / hour x 5 mins Standing in parallel bars: Standing on airex: toe taps to 4 inch step with 2-1 rail assist x15 reps bilaterally;50% UE support Toe taps to 1/2 foam with 25 % UE support mini squat unsupported x10 reps; heel/toe raises x15 reps with B rail assist for balance; supervision for safety Alternate march x15 bilaterally with cues to increase hip flexion for better ROM/strength; staggered stance, head turns side/side, up/down x5 reps each, each foot in front;  Lunging to BOSU ball  x15 reps each leg, BUE support in // bars, supervision for safety Feet together head turns left and right on floor x 2 mins with  50% need of UE support Feet together head turns left and right on blue foam x 2 mins , 75% need of UE support -VCs for proper technique and positioning for each exercise                          PT Education - 02/15/18 1108    Education Details  HEP    Person(s) Educated  Patient    Methods  Explanation    Comprehension  Verbalized understanding       PT Short Term Goals - 01/31/18 1120      PT SHORT TERM GOAL #1   Title  Patient will reduce timed up and go to <11 seconds to reduce fall risk and demonstrate improved transfer/gait ability.    Baseline  22.07 sec    Time  6    Period  Weeks    Status  New    Target Date  02/28/18      PT SHORT TERM GOAL #2   Title   Patient will be independent with ascend/descend 2 steps using single UE in step over step pattern without LOB.    Time  6    Period  Weeks    Status  New    Target Date  02/28/18      PT SHORT TERM GOAL #3   Time  6    Target Date  03/14/18        PT Long Term Goals - 01/31/18 1119      PT LONG TERM GOAL #1   Title  Patient will be independent in home exercise program to improve strength/mobility for better functional independence with ADLs.    Time  12    Period  Weeks    Status  New    Target Date  04/25/18      PT LONG TERM GOAL #2   Title  Patient (> 95 years old) will complete five times sit to stand test in < 15 seconds indicating an increased LE strength and improved balance    Baseline  34.21 sec    Time  12    Period  Weeks    Status  New    Target Date  04/25/18      PT LONG TERM GOAL #3   Title  Patient will increase six minute walk test distance to >1000 for progression to community ambulator and improve gait ability    Baseline  550 feet    Time  12    Period  Weeks    Status  New    Target Date  04/25/18      PT LONG TERM GOAL #4   Title  Patient will increase 10 meter walk test to >1.51m/s as to improve gait speed for better community ambulation and to reduce fall risk    Baseline  .49 m/sec    Time  12    Period  Weeks    Status  New    Target Date  04/25/18            Plan - 02/15/18 1109    Clinical Impression Statement  Pt was able to progress through exercises today with decreases in loss of dynamic standing during reaching activities. Pt continues to demonstrate improvement with dynamic and static balance, with decreased LOB noted with dynamic activities on even and uneven surfaces. Pt has decreased strength and single leg stability. Pt would continue to benefit from skilled  therapy services in order to continue strengthening LE's and improving dynamic and static balance.    Rehab Potential  Fair    PT Frequency  2x / week    PT Duration   12 weeks    PT Treatment/Interventions  Patient/family education;Neuromuscular re-education;Balance training;Stair training;Therapeutic activities;Therapeutic exercise;Gait training;Aquatic Therapy    PT Next Visit Plan  balance and strengthening    PT Home Exercise Plan  unable to provide due to short Evaluation session scheduled    Consulted and Agree with Plan of Care  Patient;Family member/caregiver       Patient will benefit from skilled therapeutic intervention in order to improve the following deficits and impairments:  Abnormal gait, Decreased balance, Decreased endurance, Decreased mobility, Impaired flexibility, Decreased strength, Decreased knowledge of use of DME, Decreased activity tolerance, Difficulty walking  Visit Diagnosis: Muscle weakness (generalized)  Other lack of coordination  Difficulty in walking, not elsewhere classified     Problem List Patient Active Problem List   Diagnosis Date Noted  . Cognitive deficit, post-stroke 12/31/2017  . Ataxia, post-stroke 12/31/2017  . Dysarthria, post-stroke   . Gait disturbance, post-stroke   . H/O cerebral parenchymal hemorrhage 11/04/2017  . Essential hypertension 11/04/2017  . Hyperlipidemia 11/04/2017  . Diabetes ([place]) 11/04/2017  . CAD (coronary artery disease) 11/04/2017  . 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 Hypokalemia 11/04/2017  . Right-sided nontraumatic intracerebral hemorrhage of cerebellum ([place])   . History of cervical cancer   . History of TIA (transient ischemic attack)   . Acute systolic congestive heart failure ([place])   . Reactive hypertension   . Hypernatremia   . Leukocytosis   . Acute blood loss anemia   . Elevated serum creatinine   . Acute respiratory failure with hypoxia ([place])   . IVH (intraventricular hemorrhage) ([place]) 10/29/2017  . Hypoxia 10/28/2017  . Pancreatic lesion 05/24/2017  . 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 Nodule of upper lobe of left lung 06/04/2015  .  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 Cerebral thrombosis with cerebral infarction 04/03/2015  . Cancer of right colon ([place]) 03/28/2015  . CVA (cerebral infarction) 12/21/2014  . Polycythemia vera ([place]) 06/22/2006    [name], PT DPT 02/15/2018, 11:12 AM  [place] MAIN [facility] SERVICES [address] [place], [place], [address] Phone: [phone]   Fax:  [phone]  Name: [name] MRN: [id number] Date of Birth: 08/18/1931

## 2018-02-15 NOTE — Therapy (Signed)
[place] MAIN [facility] SERVICES [address] [place], [place], [address] Phone: [phone]   Fax:  [phone]  Occupational Therapy Treatment  Patient Details  Name: [name] MRN: [id number] Date of Birth: 07-12-1931 Referring Provider: Charlett Blake   Encounter Date: 02/15/2018  OT End of Session - 02/15/18 1025    Visit Number  4    Number of Visits  24    Date for OT Re-Evaluation  04/14/18    Authorization Type  Medicare    Authorization Time Period  visit 4/10, reporting period starting 01/20/2018    OT Start Time  1015    OT Stop Time  1100    OT Time Calculation (min)  45 min    Activity Tolerance  Patient tolerated treatment well    Behavior During Therapy  [facility] for tasks assessed/performed       Past Medical History:  Diagnosis Date  . Adenocarcinoma in situ of cervix   . Arthritis    hands  . Breast cancer ([place])   . Colon cancer ([place])   . Diabetes mellitus without complication ([place])   . Endometrial carcinoma (HCC)    s/p total abdominal hysterectomy  . H/O compression fracture of spine 2014   thoracic spine  . H/O polycythemia vera   . H/O TIA (transient ischemic attack) and stroke 09/2014, 03/2015   No deficits  . Hypertension   . Hyperuricemia   . Microalbuminuria   . Polycythemia vera ([place])   . Recurrent falls   . Skin cancer    face, legs  . Stroke [facility]) 2008   no deficits  . Trochanteric bursitis   . Varicose veins    treated    Past Surgical History:  Procedure Laterality Date  . ABDOMINAL HYSTERECTOMY    . BOWEL RESECTION N/A 03/28/2015   Procedure: SMALL BOWEL RESECTION;  Surgeon: Leonie Green, MD;  Location: [facility] ORS;  Service: General;  Laterality: N/A;  . CATARACT EXTRACTION W/ INTRAOCULAR LENS IMPLANT Right   . CATARACT EXTRACTION W/PHACO Left 10/07/2015   Procedure: CATARACT EXTRACTION PHACO AND INTRAOCULAR LENS PLACEMENT (IOC);  Surgeon: Ronnell Freshwater, MD;  Location:  [place];  Service: Ophthalmology;  Laterality: Left;  DIABETIC - oral meds VISION BLUE  . CORONARY ANGIOGRAPHY N/A 10/29/2017   Procedure: CORONARY ANGIOGRAPHY;  Surgeon: Dionisio David, MD;  Location: [place] CV LAB;  Service: Cardiovascular;  Laterality: N/A;  . EXPLORATORY LAPAROTOMY     for fibroids  . LEFT HEART CATH Right 10/29/2017   Procedure: Left Heart Cath;  Surgeon: Dionisio David, MD;  Location: [place] CV LAB;  Service: Cardiovascular;  Laterality: Right;  . TONSILLECTOMY      There were no vitals filed for this visit.  Subjective Assessment - 02/15/18 1022    Subjective   Pt. returned with her homework folder.     Pertinent History  84 year old female with adenocarcinoma of the cervix as well as endometrial carcinoma and polycythemia vera as well as diabetes who had hypertensive right cerebellar hemorrhage onset 10/28/2017.  She was treated initially at [facility] and then transferred to [facility].  Neurosurgical evaluation per Dr. Ronnald Ramp concluded no surgery was needed.  The patient completed inpatient rehabilitation at [facility] (including SLP) 11/04/2017-11/18/2107 and was discharged at a 24-hour supervision level.  The patient has hired caregiver to provide 24-hour supervision. Patient has received home health rehab services, including SLP.  Patient Stated Goals  Patient would like t be independent in all tasks and be able to live alone again.    Currently in Pain?  No/denies    Multiple Pain Sites  No      OT TREATMENT    Neuro muscular re-education:  Pt. worked on grasping coins from a tabletop surface, placing them into a resistive container, and pushing them through the slot while isolating his 2nd digit. Pt. performed [facility] skills training to improve speed and dexterity needed for ADL tasks and writing. Pt. demonstrated grasping 1 inch sticks,  inch cylindrical collars, and  inch flat washers on the [employer] pegboard. Pt.  performed grasping each item with her 2nd digit and thumb, and storing them in the palm. Pt. presented with difficulty storing  inch objects at a time in the palmar aspect of the hand. Pt. Worked on writing tasks using a builtup handle pen, and was able to maintain grasp on the pen throughout the writing task.. Pt. required the word to be written out for her to copy. Pt. Worked on writing in progressively smaller text. Pt. continues to require practice with writing legibility, and speed.                           OT Education - 02/15/18 1025    Education Details  handwriting, fine motor coordination    Person(s) Educated  Patient    Methods  Explanation;Demonstration;Verbal cues    Comprehension  Verbalized understanding;Returned demonstration;Verbal cues required          OT Long Term Goals - 01/22/18 1121      OT LONG TERM GOAL #1   Title  Patient will complete bathing with modified independence    Baseline  moderate assist    Time  12    Period  Weeks    Status  New      OT LONG TERM GOAL #2   Title  Patient will complete dressing skills with modified independence    Baseline  moderate assist    Time  12    Period  Weeks    Status  New      OT LONG TERM GOAL #3   Title  Patient will complete light meal prep with min assist    Baseline  max assist    Time  12    Period  Weeks    Status  New      OT LONG TERM GOAL #4   Title  Patient will increase R grip strength by 5# to open jars and containers with modified independence    Baseline  difficulty at eval    Time  12    Period  Weeks    Status  New      OT LONG TERM GOAL #5   Title  Patient will improve strength by 1 mm grade RUE to assist with obtaining items from the closet.     Baseline  4/5 overall RUE    Time  8    Period  Weeks    Status  New            Plan - 02/15/18 1027    Clinical Impression Statement  Pt. continues to present with impaired hand function, and [facility] skills.  Pt. continues to present with decreased writing legibility. Pt. continues to work on improving [facility], skills, and hand function skills in order to improve UE functioning during  ADLs, and  IADLs.    Occupational Profile and client history currently impacting functional performance  family more than 30 mins away, has 24 hour caregiver assist, decreased awareness, memory and safety    Occupational performance deficits (Please refer to evaluation for details):  ADL's;IADL's;Social Participation    Rehab Potential  Good    Current Impairments/barriers affecting progress:  memory, requires 24 hour caregivers,     OT Frequency  2x / week    OT Duration  12 weeks    OT Treatment/Interventions  Self-care/ADL training;Cryotherapy;Therapeutic exercise;DME and/or AE instruction;Functional Mobility Training;Cognitive remediation/compensation;Balance training;Neuromuscular education;Manual Therapy;Moist Heat;Therapeutic activities;Patient/family education    Clinical Decision Making  Several treatment options, min-mod task modification necessary    Consulted and Agree with Plan of Care  Patient    Family Member Consulted  caregiver       Patient will benefit from skilled therapeutic intervention in order to improve the following deficits and impairments:  Decreased balance, Decreased mobility, Difficulty walking, Decreased cognition, Decreased activity tolerance, Decreased coordination, Decreased safety awareness, Decreased strength, Impaired UE functional use  Visit Diagnosis: Muscle weakness (generalized)  Other lack of coordination    Problem List Patient Active Problem List   Diagnosis Date Noted  . Cognitive deficit, post-stroke 12/31/2017  . Ataxia, post-stroke 12/31/2017  . Dysarthria, post-stroke   . Gait disturbance, post-stroke   . H/O cerebral parenchymal hemorrhage 11/04/2017  . Essential hypertension 11/04/2017  . Hyperlipidemia 11/04/2017  . Diabetes ([place]) 11/04/2017  . CAD (coronary  artery disease) 11/04/2017  . 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 Hypokalemia 11/04/2017  . Right-sided nontraumatic intracerebral hemorrhage of cerebellum ([place])   . History of cervical cancer   . History of TIA (transient ischemic attack)   . Acute systolic congestive heart failure ([place])   . Reactive hypertension   . Hypernatremia   . Leukocytosis   . Acute blood loss anemia   . Elevated serum creatinine   . Acute respiratory failure with hypoxia ([place])   . IVH (intraventricular hemorrhage) ([place]) 10/29/2017  . Hypoxia 10/28/2017  . Pancreatic lesion 05/24/2017  . 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 Nodule of upper lobe of left lung 06/04/2015  . 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 Cerebral thrombosis with cerebral infarction 04/03/2015  . Cancer of right colon ([place]) 03/28/2015  . CVA (cerebral infarction) 12/21/2014  . Polycythemia vera ([place]) 06/22/2006    Harrel Carina, MS, OTR/L 02/15/2018, 10:44 AM  [place] MAIN [facility] SERVICES [address] [place], [place], [address] Phone: [phone]   Fax:  [phone]  Name: KALIMAH CAPURRO MRN: [id number] Date of Birth: Jan 14, 1932

## 2018-02-17 ENCOUNTER — Ambulatory Visit: Payer: Medicare Other | Admitting: Speech Pathology

## 2018-02-17 ENCOUNTER — Encounter: Payer: Self-pay | Admitting: Speech Pathology

## 2018-02-17 DIAGNOSIS — M6281 Muscle weakness (generalized): Secondary | ICD-10-CM | POA: Diagnosis not present

## 2018-02-17 DIAGNOSIS — R471 Dysarthria and anarthria: Secondary | ICD-10-CM

## 2018-02-17 DIAGNOSIS — I69123 Fluency disorder following nontraumatic intracerebral hemorrhage: Secondary | ICD-10-CM

## 2018-02-17 DIAGNOSIS — R41841 Cognitive communication deficit: Secondary | ICD-10-CM

## 2018-02-17 NOTE — Therapy (Signed)
[place] MAIN [facility] SERVICES [address] [place], [place], [address] Phone: [phone]   Fax:  [phone]  Speech Language Pathology Treatment  Patient Details  Name: [name] MRN: [id number] Date of Birth: June 01, 1932 Referring Provider: Charlett Blake    Encounter Date: 02/17/2018  End of Session - 02/17/18 1057    Visit Number  6    Number of Visits  17    Date for SLP Re-Evaluation  03/21/18    SLP Start Time  1000    SLP Stop Time   1050    SLP Time Calculation (min)  50 min    Activity Tolerance  Patient tolerated treatment well       Past Medical History:  Diagnosis Date  . Adenocarcinoma in situ of cervix   . Arthritis    hands  . Breast cancer ([place])   . Colon cancer ([place])   . Diabetes mellitus without complication ([place])   . Endometrial carcinoma (HCC)    s/p total abdominal hysterectomy  . H/O compression fracture of spine 2014   thoracic spine  . H/O polycythemia vera   . H/O TIA (transient ischemic attack) and stroke 09/2014, 03/2015   No deficits  . Hypertension   . Hyperuricemia   . Microalbuminuria   . Polycythemia vera ([place])   . Recurrent falls   . Skin cancer    face, legs  . Stroke [facility]) 2008   no deficits  . Trochanteric bursitis   . Varicose veins    treated    Past Surgical History:  Procedure Laterality Date  . ABDOMINAL HYSTERECTOMY    . BOWEL RESECTION N/A 03/28/2015   Procedure: SMALL BOWEL RESECTION;  Surgeon: Leonie Green, MD;  Location: [facility] ORS;  Service: General;  Laterality: N/A;  . CATARACT EXTRACTION W/ INTRAOCULAR LENS IMPLANT Right   . CATARACT EXTRACTION W/PHACO Left 10/07/2015   Procedure: CATARACT EXTRACTION PHACO AND INTRAOCULAR LENS PLACEMENT (IOC);  Surgeon: Ronnell Freshwater, MD;  Location: [place];  Service: Ophthalmology;  Laterality: Left;  DIABETIC - oral meds VISION BLUE  . CORONARY ANGIOGRAPHY N/A 10/29/2017   Procedure: CORONARY  ANGIOGRAPHY;  Surgeon: Dionisio David, MD;  Location: [place] CV LAB;  Service: Cardiovascular;  Laterality: N/A;  . EXPLORATORY LAPAROTOMY     for fibroids  . LEFT HEART CATH Right 10/29/2017   Procedure: Left Heart Cath;  Surgeon: Dionisio David, MD;  Location: [place] CV LAB;  Service: Cardiovascular;  Laterality: Right;  . TONSILLECTOMY      There were no vitals filed for this visit.  Subjective Assessment - 02/17/18 1056    Subjective  "I'm impatient"            ADULT SLP TREATMENT - 02/17/18 0001      General Information   Behavior/Cognition  Alert;Cooperative;Pleasant mood    HPI   84 year old female with adenocarcinoma of the cervix as well as endometrial carcinoma and polycythemia vera as well as diabetes who had hypertensive right cerebellar hemorrhage onset 10/28/2017.  She was treated initially at [facility] and then transferred to [facility].  Neurosurgical evaluation per Dr. Ronnald Ramp concluded no surgery was needed.  The patient completed inpatient rehabilitation at [facility] (including SLP) 11/04/2017-11/18/2107 and was discharged at a 24-hour supervision level.  The patient has hired caregiver to provide 24-hour supervision. Patient has received home health rehab services, including SLP.       Treatment Provided  Treatment provided  Cognitive-Linquistic      Pain Assessment   Pain Assessment  No/denies pain      Cognitive-Linquistic Treatment   Treatment focused on  Apraxia;Aphasia    Skilled Treatment  HANDWRITING: the patient is concerned regarding her handwriting.  Legibility improves with lined paper to encourage larger letters.  Patient returned with homework showing much improved legibility. Patient able to complete worksheet in session with 100% legibility.  FLUENCY: spontaneous speech is notable for significant episodes of dysfluency.  The patient is 100% fluent when reading words and simple sentences.  Patient is 85% fluency in  simple cognitive linguistic task and 75% fluent in more abstract cognitive linguistic task.  WORD FINDING: Patient is able to complete simple word finding worksheet with 90% accuracy.      Assessment / Recommendations / Plan   Plan  Continue with current plan of care      Progression Toward Goals   Progression toward goals  Progressing toward goals       SLP Education - 02/17/18 1056    Education Details  slow speech and think before speaking to improve fluency    Person(s) Educated  Patient    Methods  Explanation    Comprehension  Verbalized understanding         SLP Long Term Goals - 01/19/18 1443      SLP LONG TERM GOAL #1   Title  Patient will generate fluent, grammatical, and cogent sentence to complete simple/concrete linguistic task with 80% accuracy.    Time  4    Period  Weeks    Status  New    Target Date  02/18/18      SLP LONG TERM GOAL #2   Title  Patient will generate grammatical, fluent, and cogent sentences to complete abstract/complex linguistic task with 80% accuracy.    Time  8    Period  Weeks    Status  New    Target Date  03/21/18      SLP LONG TERM GOAL #3   Title  Patient will identify cognitive-communication barriers and participate in developing functional compensatory strategies.    Time  8    Period  Weeks    Status  New    Target Date  03/21/18      SLP LONG TERM GOAL #4   Title  Patient will complete moderately complex executive function skills tasks with 80% accuracy.    Time  8    Period  Weeks    Status  New    Target Date  03/21/18       Plan - 02/17/18 1057    Clinical Impression Statement  The patient is most fluent under very controlled setting, such as reading aloud.  Her fluency decreases as the cognitive load of the task increases.    Speech Therapy Frequency  2x / week    Duration  Other (comment)    Treatment/Interventions  Compensatory strategies;Patient/family education;Cognitive reorganization;Language  facilitation;Internal/external aids    Potential to Achieve Goals  Good    Potential Considerations  Ability to learn/carryover information;Pain level;Family/community support;Co-morbidities;Previous level of function;Cooperation/participation level;Severity of impairments;Medical prognosis    SLP Home Exercise Plan  writing, word finding work Financial trader and Agree with Plan of Care  Patient       Patient will benefit from skilled therapeutic intervention in order to improve the following deficits and impairments:   Dysarthria  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 Cognitive deficit, post-stroke 12/31/2017  . Ataxia, post-stroke 12/31/2017  . Dysarthria, post-stroke   . Gait disturbance, post-stroke   . H/O cerebral parenchymal hemorrhage 11/04/2017  . Essential hypertension 11/04/2017  . Hyperlipidemia 11/04/2017  . Diabetes ([place]) 11/04/2017  . CAD (coronary artery disease) 11/04/2017  . 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 Hypokalemia 11/04/2017  . Right-sided nontraumatic intracerebral hemorrhage of cerebellum ([place])   . History of cervical cancer   . History of TIA (transient ischemic attack)   . Acute systolic congestive heart failure ([place])   . Reactive hypertension   . Hypernatremia   . Leukocytosis   . Acute blood loss anemia   . Elevated serum creatinine   . Acute respiratory failure with hypoxia ([place])   . IVH (intraventricular hemorrhage) ([place]) 10/29/2017  . Hypoxia 10/28/2017  . Pancreatic lesion 05/24/2017  . 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 Nodule of upper lobe of left lung 06/04/2015  . 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 Cerebral thrombosis with cerebral infarction 04/03/2015  . Cancer of right colon ([place]) 03/28/2015  . CVA (cerebral infarction) 12/21/2014  . Polycythemia  vera ([place]) 06/22/2006   Leroy Sea, MS/CCC- SLP  Lou Miner 02/17/2018, 10:57 AM  [place] MAIN [facility] SERVICES [address] [place], [place], [address] Phone: [phone]   Fax:  [phone]   Name: [name] MRN: [id number] Date of Birth: 07/19/1931

## 2018-02-18 ENCOUNTER — Other Ambulatory Visit: Payer: Self-pay | Admitting: Physical Medicine & Rehabilitation

## 2018-02-22 ENCOUNTER — Ambulatory Visit: Payer: Medicare Other | Attending: Physical Medicine & Rehabilitation | Admitting: Physical Therapy

## 2018-02-22 ENCOUNTER — Ambulatory Visit: Payer: Medicare Other | Admitting: Speech Pathology

## 2018-02-22 ENCOUNTER — Encounter: Payer: Self-pay | Admitting: Speech Pathology

## 2018-02-22 ENCOUNTER — Encounter: Payer: Self-pay | Admitting: Physical Therapy

## 2018-02-22 DIAGNOSIS — R278 Other lack of coordination: Secondary | ICD-10-CM | POA: Insufficient documentation

## 2018-02-22 DIAGNOSIS — R41841 Cognitive communication deficit: Secondary | ICD-10-CM

## 2018-02-22 DIAGNOSIS — M6281 Muscle weakness (generalized): Secondary | ICD-10-CM | POA: Diagnosis not present

## 2018-02-22 DIAGNOSIS — I69123 Fluency disorder following nontraumatic intracerebral hemorrhage: Secondary | ICD-10-CM | POA: Diagnosis present

## 2018-02-22 DIAGNOSIS — R262 Difficulty in walking, not elsewhere classified: Secondary | ICD-10-CM | POA: Insufficient documentation

## 2018-02-22 NOTE — Therapy (Addendum)
[place] MAIN [facility] SERVICES [address] [place], [place], [address] Phone: [phone]   Fax:  [phone]  Physical Therapy Treatment  Patient Details  Name: [name] MRN: [id number] Date of Birth: May 10, 1932 Referring Provider: Charlett Blake   Encounter Date: 02/22/2018  PT End of Session - 02/22/18 1236    Visit Number  4    Number of Visits  25    Date for PT Re-Evaluation  04/25/18    Authorization Type  4/10 with [facility] 01/31/18    PT Start Time  1300    PT Stop Time  1345    PT Time Calculation (min)  45 min    Equipment Utilized During Treatment  Gait belt    Activity Tolerance  Patient tolerated treatment well    Behavior During Therapy  [facility] for tasks assessed/performed       Past Medical History:  Diagnosis Date  . Adenocarcinoma in situ of cervix   . Arthritis    hands  . Breast cancer ([place])   . Colon cancer ([place])   . Diabetes mellitus without complication ([place])   . Endometrial carcinoma (HCC)    s/p total abdominal hysterectomy  . H/O compression fracture of spine 2014   thoracic spine  . H/O polycythemia vera   . H/O TIA (transient ischemic attack) and stroke 09/2014, 03/2015   No deficits  . Hypertension   . Hyperuricemia   . Microalbuminuria   . Polycythemia vera ([place])   . Recurrent falls   . Skin cancer    face, legs  . Stroke [facility]) 2008   no deficits  . Trochanteric bursitis   . Varicose veins    treated    Past Surgical History:  Procedure Laterality Date  . ABDOMINAL HYSTERECTOMY    . BOWEL RESECTION N/A 03/28/2015   Procedure: SMALL BOWEL RESECTION;  Surgeon: Leonie Green, MD;  Location: [facility] ORS;  Service: General;  Laterality: N/A;  . CATARACT EXTRACTION W/ INTRAOCULAR LENS IMPLANT Right   . CATARACT EXTRACTION W/PHACO Left 10/07/2015   Procedure: CATARACT EXTRACTION PHACO AND INTRAOCULAR LENS PLACEMENT (IOC);  Surgeon: Ronnell Freshwater, MD;  Location: [place];   Service: Ophthalmology;  Laterality: Left;  DIABETIC - oral meds VISION BLUE  . CORONARY ANGIOGRAPHY N/A 10/29/2017   Procedure: CORONARY ANGIOGRAPHY;  Surgeon: Dionisio David, MD;  Location: [place] CV LAB;  Service: Cardiovascular;  Laterality: N/A;  . EXPLORATORY LAPAROTOMY     for fibroids  . LEFT HEART CATH Right 10/29/2017   Procedure: Left Heart Cath;  Surgeon: Dionisio David, MD;  Location: [place] CV LAB;  Service: Cardiovascular;  Laterality: Right;  . TONSILLECTOMY      There were no vitals filed for this visit.  Subjective Assessment - 02/22/18 1303    Subjective  Patient reports doing well; denies any pain. She reports "my balance is not good." Pt reports she has had one fall since last session but states "it wasn't a bad fall- I didn't get hurt."     Pertinent History    84 year old female with adenocarcinoma of the cervix as well as endometrial carcinoma and polycythemia vera as well as diabetes who had hypertensive right cerebellar hemorrhage onset 10/28/2017.  She was treated initially at [facility] and then transferred to [facility].  Neurosurgical evaluation per Dr. Ronnald Ramp concluded no surgery was needed.  The patient completed inpatient rehabilitation at [facility] (including SLP) 11/04/2017-11/18/2107  and was discharged at a 24-hour supervision level.  The patient has hired caregiver to provide 24-hour supervision. Patient has received home health rehab services, including SLP.    Limitations  Standing;Walking    How long can you walk comfortably?  a few minutes    Patient Stated Goals  Patient wants to walk better and not need the RW.     Currently in Pain?  No/denies        Treatment Warm up on Nustep BUE/BLE level 2 x4 min (unbilled)  Standing in parallel bars: Standing on airex: -toe taps to 4 inch step with 1 rail assist x15 reps bilaterally; -standing one foot on step, Alternate BUE ball pass side/side x10 reps each foot on  step; -mini squat unsupported 2x10 reps; -heel/toe raises x15 reps with B rail assist for balance; -Alternate march x15 bilaterally with cues to increase hip flexion for better ROM/strength; -standing, head turns side/side, up/down x5 reps each;  VCs for proper technique and positioning during all activities    Forwards/backwards stepping over 1/2 foam roll x10 reps each direction, CGA for safety, VCs for lifting up feet high enough to clear the bolster Side-to-side stepping over 1/2 foam roll x10 reps each direction, CGA for safety, VCs for taking large enough step to get both feet over the bolster  Lunging onto dynadisk forwards x15 reps each leg with intermittent single UE support with VCs for shifting weight over front leg and bending knee to shift weight  BLE leg press 45# x15 reps, 60# x15 reps, VCs for slowing eccentric return and min A for positioning LEs onto footplate                    PT Education - 02/22/18 1236    Education Details  exercise technique/form    Person(s) Educated  Patient    Methods  Explanation;Demonstration;Verbal cues    Comprehension  Verbalized understanding;Returned demonstration;Verbal cues required;Need further instruction       PT Short Term Goals - 01/31/18 1120      PT SHORT TERM GOAL #1   Title  Patient will reduce timed up and go to <11 seconds to reduce fall risk and demonstrate improved transfer/gait ability.    Baseline  22.07 sec    Time  6    Period  Weeks    Status  New    Target Date  02/28/18      PT SHORT TERM GOAL #2   Title  Patient will be independent with ascend/descend 2 steps using single UE in step over step pattern without LOB.    Time  6    Period  Weeks    Status  New    Target Date  02/28/18      PT SHORT TERM GOAL #3   Time  6    Target Date  03/14/18        PT Long Term Goals - 01/31/18 1119      PT LONG TERM GOAL #1   Title  Patient will be independent in home exercise program to  improve strength/mobility for better functional independence with ADLs.    Time  12    Period  Weeks    Status  New    Target Date  04/25/18      PT LONG TERM GOAL #2   Title  Patient (> 70 years old) will complete five times sit to stand test in < 15 seconds indicating an increased LE strength  and improved balance    Baseline  34.21 sec    Time  12    Period  Weeks    Status  New    Target Date  04/25/18      PT LONG TERM GOAL #3   Title  Patient will increase six minute walk test distance to >1000 for progression to community ambulator and improve gait ability    Baseline  550 feet    Time  12    Period  Weeks    Status  New    Target Date  04/25/18      PT LONG TERM GOAL #4   Title  Patient will increase 10 meter walk test to >1.59m/s as to improve gait speed for better community ambulation and to reduce fall risk    Baseline  .49 m/sec    Time  12    Period  Weeks    Status  New    Target Date  04/25/18            Plan - 02/22/18 1435    Clinical Impression Statement  Pt tolerated therapy session well and progressed through standing static and dynamic postural control activities. Pt demonstrated some LOB in SLS on compliant surfaces when performing toe tapping exercises and staggered stance with one foot on step and one foot on airex pad; required CGA for safety and VCs for technique and sequencing. Pt verbalized feeling increased strength in LEs following mini squats as well as leg press activities; required VCs for proper technique for better strengthening. Pt would continue to benefit from skilled PT intervention for improvements in balance, srength, and gait safety.     Rehab Potential  Fair    PT Frequency  2x / week    PT Duration  12 weeks    PT Treatment/Interventions  Patient/family education;Neuromuscular re-education;Balance training;Stair training;Therapeutic activities;Therapeutic exercise;Gait training;Aquatic Therapy    PT Next Visit Plan  balance and  strengthening    PT Home Exercise Plan  unable to provide due to short Evaluation session scheduled    Consulted and Agree with Plan of Care  Patient;Family member/caregiver       Patient will benefit from skilled therapeutic intervention in order to improve the following deficits and impairments:  Abnormal gait, Decreased balance, Decreased endurance, Decreased mobility, Impaired flexibility, Decreased strength, Decreased knowledge of use of DME, Decreased activity tolerance, Difficulty walking  Visit Diagnosis: Muscle weakness (generalized)  Difficulty in walking, not elsewhere classified     Problem List Patient Active Problem List   Diagnosis Date Noted  . Cognitive deficit, post-stroke 12/31/2017  . Ataxia, post-stroke 12/31/2017  . Dysarthria, post-stroke   . Gait disturbance, post-stroke   . H/O cerebral parenchymal hemorrhage 11/04/2017  . Essential hypertension 11/04/2017  . Hyperlipidemia 11/04/2017  . Diabetes ([place]) 11/04/2017  . CAD (coronary artery disease) 11/04/2017  . 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 Hypokalemia 11/04/2017  . Right-sided nontraumatic intracerebral hemorrhage of cerebellum ([place])   . History of cervical cancer   . History of TIA (transient ischemic attack)   . Acute systolic congestive heart failure ([place])   . Reactive hypertension   . Hypernatremia   . Leukocytosis   . Acute blood loss anemia   . Elevated serum creatinine   . Acute respiratory failure with hypoxia ([place])   . IVH (intraventricular hemorrhage) ([place]) 10/29/2017  . Hypoxia 10/28/2017  . Pancreatic lesion 05/24/2017  . 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 Nodule of upper  lobe of left lung 06/04/2015  . 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 Cerebral thrombosis with cerebral infarction 04/03/2015  . Cancer of right colon ([place]) 03/28/2015  . CVA (cerebral infarction) 12/21/2014  . Polycythemia vera ([place]) 06/22/2006   Harriet Masson,  SPT This entire session was performed under direct supervision and direction of a licensed therapist/therapist assistant . I have personally read, edited and approve of the note as written.  Trotter,Margaret PT, DPT 02/22/2018, 4:49 PM  [place] MAIN [facility] SERVICES [address] [place], [place], [address] Phone: [phone]   Fax:  [phone]  Name: ZYIONNA PESCE MRN: [id number] Date of Birth: [birth year]

## 2018-02-22 NOTE — Therapy (Signed)
[place] MAIN [facility] SERVICES [address] [place], [place], [address] Phone: [phone]   Fax:  [phone]  Speech Language Pathology Treatment/Progress Note  Patient Details  Name: [name] MRN: [id number] Date of Birth: 1931-11-01 Referring Provider: Charlett Blake    Encounter Date: 02/22/2018  End of Session - 02/22/18 1450    Visit Number  7    Number of Visits  17    Date for SLP Re-Evaluation  03/21/18    SLP Start Time  1400    SLP Stop Time   1451    SLP Time Calculation (min)  51 min       Past Medical History:  Diagnosis Date  . Adenocarcinoma in situ of cervix   . Arthritis    hands  . Breast cancer ([place])   . Colon cancer ([place])   . Diabetes mellitus without complication ([place])   . Endometrial carcinoma (HCC)    s/p total abdominal hysterectomy  . H/O compression fracture of spine 2014   thoracic spine  . H/O polycythemia vera   . H/O TIA (transient ischemic attack) and stroke 09/2014, 03/2015   No deficits  . Hypertension   . Hyperuricemia   . Microalbuminuria   . Polycythemia vera ([place])   . Recurrent falls   . Skin cancer    face, legs  . Stroke [facility]) 2008   no deficits  . Trochanteric bursitis   . Varicose veins    treated    Past Surgical History:  Procedure Laterality Date  . ABDOMINAL HYSTERECTOMY    . BOWEL RESECTION N/A 03/28/2015   Procedure: SMALL BOWEL RESECTION;  Surgeon: Leonie Green, MD;  Location: [facility] ORS;  Service: General;  Laterality: N/A;  . CATARACT EXTRACTION W/ INTRAOCULAR LENS IMPLANT Right   . CATARACT EXTRACTION W/PHACO Left 10/07/2015   Procedure: CATARACT EXTRACTION PHACO AND INTRAOCULAR LENS PLACEMENT (IOC);  Surgeon: Ronnell Freshwater, MD;  Location: [place];  Service: Ophthalmology;  Laterality: Left;  DIABETIC - oral meds VISION BLUE  . CORONARY ANGIOGRAPHY N/A 10/29/2017   Procedure: CORONARY ANGIOGRAPHY;  Surgeon: Dionisio David, MD;   Location: [place] CV LAB;  Service: Cardiovascular;  Laterality: N/A;  . EXPLORATORY LAPAROTOMY     for fibroids  . LEFT HEART CATH Right 10/29/2017   Procedure: Left Heart Cath;  Surgeon: Dionisio David, MD;  Location: [place] CV LAB;  Service: Cardiovascular;  Laterality: Right;  . TONSILLECTOMY      There were no vitals filed for this visit.  Subjective Assessment - 02/22/18 1450    Subjective  "I'm impatient"            ADULT SLP TREATMENT - 02/22/18 0001      General Information   Behavior/Cognition  Alert;Cooperative;Pleasant mood    HPI   84 year old female with adenocarcinoma of the cervix as well as endometrial carcinoma and polycythemia vera as well as diabetes who had hypertensive right cerebellar hemorrhage onset 10/28/2017.  She was treated initially at [facility] and then transferred to [facility].  Neurosurgical evaluation per Dr. Ronnald Ramp concluded no surgery was needed.  The patient completed inpatient rehabilitation at [facility] (including SLP) 11/04/2017-11/18/2107 and was discharged at a 24-hour supervision level.  The patient has hired caregiver to provide 24-hour supervision. Patient has received home health rehab services, including SLP.       Treatment Provided   Treatment provided  Cognitive-Linquistic  Pain Assessment   Pain Assessment  No/denies pain      Cognitive-Linquistic Treatment   Treatment focused on  Apraxia;Aphasia    Skilled Treatment  HANDWRITING: the patient is concerned regarding her handwriting.  Legibility improves with lined paper to encourage larger letters.  Patient returned with homework showing much improved legibility. Patient able to complete worksheet in session with 100% legibility.  FLUENCY: spontaneous speech is notable for significant episodes of dysfluency.  The patient is 100% fluent when reading words and simple sentences.  Patient is 85% fluency in simple cognitive linguistic task and 75%  fluent in more abstract cognitive linguistic task.  The patient is able to state that slowing her speech would be helpful, but she "always talked fast".  WORD FINDING: Patient is able to complete simple word finding worksheet with 90% accuracy.      Assessment / Recommendations / Plan   Plan  Continue with current plan of care      Progression Toward Goals   Progression toward goals  Progressing toward goals       SLP Education - 02/22/18 1450    Education Details  slow speech to improve fluency    Person(s) Educated  Patient    Methods  Explanation    Comprehension  Verbalized understanding         SLP Long Term Goals - 02/22/18 1454      SLP LONG TERM GOAL #1   Title  Patient will generate fluent, grammatical, and cogent sentence to complete simple/concrete linguistic task with 80% accuracy.    Status  Achieved      SLP LONG TERM GOAL #2   Title  Patient will generate grammatical, fluent, and cogent sentences to complete abstract/complex linguistic task with 80% accuracy.    Status  Partially Met    Target Date  03/21/18      SLP LONG TERM GOAL #3   Title  Patient will identify cognitive-communication barriers and participate in developing functional compensatory strategies.    Status  Deferred      SLP LONG TERM GOAL #4   Title  Patient will complete moderately complex executive function skills tasks with 80% accuracy.    Status  Deferred       Plan - 02/22/18 1451    Clinical Impression Statement  The patient is most fluent under very controlled setting, such as reading aloud.  Her fluency decreases as the cognitive load of the task increases.  She is generating more legible handwriting.  The patient is to have 24/7 caregivers and does not need further cognitive therapy.  She will benefit from continued ST to address self-expression, fluency, and word finding.    Speech Therapy Frequency  2x / week    Duration  Other (comment)    Treatment/Interventions  Compensatory  strategies;Patient/family education;Cognitive reorganization;Language facilitation;Internal/external aids    Potential to Achieve Goals  Good    Potential Considerations  Ability to learn/carryover information;Pain level;Family/community support;Co-morbidities;Previous level of function;Cooperation/participation level;Severity of impairments;Medical prognosis    SLP Home Exercise Plan  writing, word finding work Financial trader and Agree with Plan of Care  Patient       Patient will benefit from skilled therapeutic intervention in order to improve the following deficits and impairments:   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 Cognitive deficit, post-stroke 12/31/2017  . Ataxia, post-stroke 12/31/2017  .  Dysarthria, post-stroke   . Gait disturbance, post-stroke   . H/O cerebral parenchymal hemorrhage 11/04/2017  . Essential hypertension 11/04/2017  . Hyperlipidemia 11/04/2017  . Diabetes ([place]) 11/04/2017  . CAD (coronary artery disease) 11/04/2017  . 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 Hypokalemia 11/04/2017  . Right-sided nontraumatic intracerebral hemorrhage of cerebellum ([place])   . History of cervical cancer   . History of TIA (transient ischemic attack)   . Acute systolic congestive heart failure ([place])   . Reactive hypertension   . Hypernatremia   . Leukocytosis   . Acute blood loss anemia   . Elevated serum creatinine   . Acute respiratory failure with hypoxia ([place])   . IVH (intraventricular hemorrhage) ([place]) 10/29/2017  . Hypoxia 10/28/2017  . Pancreatic lesion 05/24/2017  . 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 Nodule of upper lobe of left lung 06/04/2015  . 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 Cerebral thrombosis with cerebral infarction 04/03/2015  . Cancer of right colon ([place]) 03/28/2015  .  CVA (cerebral infarction) 12/21/2014  . Polycythemia vera ([place]) 06/22/2006   Leroy Sea, MS/CCC- SLP  Lou Miner 02/22/2018, 2:55 PM  [place] MAIN [facility] SERVICES [address] [place], [place], [address] Phone: [phone]   Fax:  [phone]   Name: [name] MRN: [id number] Date of Birth: 01/17/1932

## 2018-02-24 ENCOUNTER — Ambulatory Visit: Payer: Medicare Other | Admitting: Physical Therapy

## 2018-02-24 ENCOUNTER — Ambulatory Visit: Payer: Medicare Other | Admitting: Speech Pathology

## 2018-02-24 ENCOUNTER — Encounter: Payer: Self-pay | Admitting: Speech Pathology

## 2018-02-24 DIAGNOSIS — R41841 Cognitive communication deficit: Secondary | ICD-10-CM

## 2018-02-24 DIAGNOSIS — M6281 Muscle weakness (generalized): Secondary | ICD-10-CM | POA: Diagnosis not present

## 2018-02-24 DIAGNOSIS — I69123 Fluency disorder following nontraumatic intracerebral hemorrhage: Secondary | ICD-10-CM

## 2018-02-24 NOTE — Therapy (Signed)
[place] MAIN [facility] SERVICES [address] [place], [place], [address] Phone: [phone]   Fax:  [phone]  Speech Language Pathology Treatment  Patient Details  Name: [name] MRN: [id number] Date of Birth: 07-Nov-1931 Referring Provider: Charlett Blake    Encounter Date: 02/24/2018  End of Session - 02/24/18 1458    Visit Number  8    Number of Visits  17    Date for SLP Re-Evaluation  03/21/18    SLP Start Time  1400    SLP Stop Time   1450    SLP Time Calculation (min)  50 min    Activity Tolerance  Patient tolerated treatment well       Past Medical History:  Diagnosis Date  . Adenocarcinoma in situ of cervix   . Arthritis    hands  . Breast cancer ([place])   . Colon cancer ([employer])   . Diabetes mellitus without complication ([place])   . Endometrial carcinoma (HCC)    s/p total abdominal hysterectomy  . H/O compression fracture of spine 2014   thoracic spine  . H/O polycythemia vera   . H/O TIA (transient ischemic attack) and stroke 09/2014, 03/2015   No deficits  . Hypertension   . Hyperuricemia   . Microalbuminuria   . Polycythemia vera ([place])   . Recurrent falls   . Skin cancer    face, legs  . Stroke [facility]) 2008   no deficits  . Trochanteric bursitis   . Varicose veins    treated    Past Surgical History:  Procedure Laterality Date  . ABDOMINAL HYSTERECTOMY    . BOWEL RESECTION N/A 03/28/2015   Procedure: SMALL BOWEL RESECTION;  Surgeon: Leonie Green, MD;  Location: [facility] ORS;  Service: General;  Laterality: N/A;  . CATARACT EXTRACTION W/ INTRAOCULAR LENS IMPLANT Right   . CATARACT EXTRACTION W/PHACO Left 10/07/2015   Procedure: CATARACT EXTRACTION PHACO AND INTRAOCULAR LENS PLACEMENT (IOC);  Surgeon: Ronnell Freshwater, MD;  Location: [place];  Service: Ophthalmology;  Laterality: Left;  DIABETIC - oral meds VISION BLUE  . CORONARY ANGIOGRAPHY N/A 10/29/2017   Procedure: CORONARY  ANGIOGRAPHY;  Surgeon: Dionisio David, MD;  Location: [place] CV LAB;  Service: Cardiovascular;  Laterality: N/A;  . EXPLORATORY LAPAROTOMY     for fibroids  . LEFT HEART CATH Right 10/29/2017   Procedure: Left Heart Cath;  Surgeon: Dionisio David, MD;  Location: [place] CV LAB;  Service: Cardiovascular;  Laterality: Right;  . TONSILLECTOMY      There were no vitals filed for this visit.         ADULT SLP TREATMENT - 02/24/18 0001      General Information   Behavior/Cognition  Alert;Cooperative;Pleasant mood    HPI   84 year old female with adenocarcinoma of the cervix as well as endometrial carcinoma and polycythemia vera as well as diabetes who had hypertensive right cerebellar hemorrhage onset 10/28/2017.  She was treated initially at [facility] and then transferred to [facility].  Neurosurgical evaluation per Dr. Ronnald Ramp concluded no surgery was needed.  The patient completed inpatient rehabilitation at [facility] (including SLP) 11/04/2017-11/18/2107 and was discharged at a 24-hour supervision level.  The patient has hired caregiver to provide 24-hour supervision. Patient has received home health rehab services, including SLP.       Treatment Provided   Treatment provided  Cognitive-Linquistic      Pain Assessment   Pain  Assessment  No/denies pain      Cognitive-Linquistic Treatment   Treatment focused on  Apraxia;Aphasia    Skilled Treatment  HANDWRITING: the patient is concerned regarding her handwriting.  Legibility improves with lined paper to encourage larger letters.  Patient returned with homework showing much improved legibility. Patient able to complete worksheet in session with 100% legibility.  FLUENCY: spontaneous speech is notable for significant episodes of dysfluency.  The patient is 100% fluent when reading words and simple sentences.  Patient is 85% fluency in simple cognitive linguistic task and 75% fluent in more abstract cognitive  linguistic task.  The patient is able to state that slowing her speech would be helpful, but she "always talked fast".  WORD FINDING: Patient is able to complete simple word finding worksheet with 90% accuracy.      Assessment / Recommendations / Plan   Plan  Continue with current plan of care      Progression Toward Goals   Progression toward goals  Progressing toward goals       SLP Education - 02/24/18 1457    Education Details  slow speech to improve fluency    Person(s) Educated  Patient    Methods  Explanation    Comprehension  Verbalized understanding         SLP Long Term Goals - 02/22/18 1454      SLP LONG TERM GOAL #1   Title  Patient will generate fluent, grammatical, and cogent sentence to complete simple/concrete linguistic task with 80% accuracy.    Status  Achieved      SLP LONG TERM GOAL #2   Title  Patient will generate grammatical, fluent, and cogent sentences to complete abstract/complex linguistic task with 80% accuracy.    Status  Partially Met    Target Date  03/21/18      SLP LONG TERM GOAL #3   Title  Patient will identify cognitive-communication barriers and participate in developing functional compensatory strategies.    Status  Deferred      SLP LONG TERM GOAL #4   Title  Patient will complete moderately complex executive function skills tasks with 80% accuracy.    Status  Deferred       Plan - 02/24/18 1458    Clinical Impression Statement  The patient is most fluent under very controlled setting, such as reading aloud.  Her fluency decreases as the cognitive load of the task increases.  She is generating more legible handwriting.  Will benefit continue ST to address self-expression, fluency, and word finding.    Speech Therapy Frequency  2x / week    Duration  Other (comment)    Treatment/Interventions  Compensatory strategies;Patient/family education;Cognitive reorganization;Language facilitation;Internal/external aids    Potential to  Achieve Goals  Good    Potential Considerations  Ability to learn/carryover information;Pain level;Family/community support;Co-morbidities;Previous level of function;Cooperation/participation level;Severity of impairments;Medical prognosis    SLP Home Exercise Plan  writing, word finding work Financial trader and Agree with Plan of Care  Patient       Patient will benefit from skilled therapeutic intervention in order to improve the following deficits and impairments:   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 Cognitive deficit, post-stroke 12/31/2017  . Ataxia, post-stroke 12/31/2017  . Dysarthria, post-stroke   . Gait disturbance, post-stroke   . H/O cerebral parenchymal hemorrhage 11/04/2017  . Essential hypertension  11/04/2017  . Hyperlipidemia 11/04/2017  . Diabetes ([place]) 11/04/2017  . CAD (coronary artery disease) 11/04/2017  . Aortic arch aneurysm ([employer]) 11/04/2017  . Hypokalemia 11/04/2017  . Right-sided nontraumatic intracerebral hemorrhage of cerebellum ([place])   . History of cervical cancer   . History of TIA (transient ischemic attack)   . Acute systolic congestive heart failure ([place])   . Reactive hypertension   . Hypernatremia   . Leukocytosis   . Acute blood loss anemia   . Elevated serum creatinine   . Acute respiratory failure with hypoxia ([place])   . IVH (intraventricular hemorrhage) ([place]) 10/29/2017  . Hypoxia 10/28/2017  . Pancreatic lesion 05/24/2017  . 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 Nodule of upper lobe of left lung 06/04/2015  . 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 Cerebral thrombosis with cerebral infarction 04/03/2015  . Cancer of right colon ([place]) 03/28/2015  . CVA (cerebral infarction) 12/21/2014  . Polycythemia vera ([place]) 06/22/2006   Leroy Sea, MS/CCC- SLP  Lou Miner 02/24/2018, 2:59 PM  [place] MAIN [facility] SERVICES [address] [place], [place], [address] Phone: [phone]   Fax:  [phone]   Name: [name] MRN: [id number] Date of Birth: 1932-04-08

## 2018-02-28 ENCOUNTER — Telehealth: Payer: Self-pay | Admitting: *Deleted

## 2018-02-28 ENCOUNTER — Encounter: Payer: Self-pay | Admitting: Occupational Therapy

## 2018-02-28 ENCOUNTER — Ambulatory Visit: Payer: Medicare Other | Admitting: Occupational Therapy

## 2018-02-28 DIAGNOSIS — R278 Other lack of coordination: Secondary | ICD-10-CM

## 2018-02-28 DIAGNOSIS — M6281 Muscle weakness (generalized): Secondary | ICD-10-CM | POA: Diagnosis not present

## 2018-02-28 NOTE — Telephone Encounter (Signed)
Attempted to call patient.  No answer.

## 2018-02-28 NOTE — Therapy (Signed)
[place] MAIN [facility] SERVICES [address] [place], [place], [address] Phone: [phone]   Fax:  [phone]  Occupational Therapy Treatment  Patient Details  Name: [name] MRN: [id number] Date of Birth: 07-30-1931 Referring Provider: Charlett Blake   Encounter Date: 02/28/2018  OT End of Session - 02/28/18 2230    Visit Number  6    Number of Visits  24    Date for OT Re-Evaluation  04/14/18    Authorization Type  Medicare    Authorization Time Period  visit 6/10, reporting period starting 01/20/2018    OT Start Time  1300    OT Stop Time  1345    OT Time Calculation (min)  45 min    Activity Tolerance  Patient tolerated treatment well    Behavior During Therapy  [facility] for tasks assessed/performed       Past Medical History:  Diagnosis Date  . Adenocarcinoma in situ of cervix   . Arthritis    hands  . Breast cancer ([place])   . Colon cancer ([place])   . Diabetes mellitus without complication ([place])   . Endometrial carcinoma (HCC)    s/p total abdominal hysterectomy  . H/O compression fracture of spine 2014   thoracic spine  . H/O polycythemia vera   . H/O TIA (transient ischemic attack) and stroke 09/2014, 03/2015   No deficits  . Hypertension   . Hyperuricemia   . Microalbuminuria   . Polycythemia vera ([place])   . Recurrent falls   . Skin cancer    face, legs  . Stroke [facility]) 2008   no deficits  . Trochanteric bursitis   . Varicose veins    treated    Past Surgical History:  Procedure Laterality Date  . ABDOMINAL HYSTERECTOMY    . BOWEL RESECTION N/A 03/28/2015   Procedure: SMALL BOWEL RESECTION;  Surgeon: Leonie Green, MD;  Location: [facility] ORS;  Service: General;  Laterality: N/A;  . CATARACT EXTRACTION W/ INTRAOCULAR LENS IMPLANT Right   . CATARACT EXTRACTION W/PHACO Left 10/07/2015   Procedure: CATARACT EXTRACTION PHACO AND INTRAOCULAR LENS PLACEMENT (IOC);  Surgeon: Ronnell Freshwater, MD;  Location:  [place];  Service: Ophthalmology;  Laterality: Left;  DIABETIC - oral meds VISION BLUE  . CORONARY ANGIOGRAPHY N/A 10/29/2017   Procedure: CORONARY ANGIOGRAPHY;  Surgeon: Dionisio David, MD;  Location: [place] CV LAB;  Service: Cardiovascular;  Laterality: N/A;  . EXPLORATORY LAPAROTOMY     for fibroids  . LEFT HEART CATH Right 10/29/2017   Procedure: Left Heart Cath;  Surgeon: Dionisio David, MD;  Location: [place] CV LAB;  Service: Cardiovascular;  Laterality: Right;  . TONSILLECTOMY      There were no vitals filed for this visit.  Subjective Assessment - 02/28/18 2229    Subjective   Pt. reports she is feeling good today.    Pertinent History  84 year old female with adenocarcinoma of the cervix as well as endometrial carcinoma and polycythemia vera as well as diabetes who had hypertensive right cerebellar hemorrhage onset 10/28/2017.  She was treated initially at [facility] and then transferred to [facility].  Neurosurgical evaluation per Dr. Ronnald Ramp concluded no surgery was needed.  The patient completed inpatient rehabilitation at [facility] (including SLP) 11/04/2017-11/18/2107 and was discharged at a 24-hour supervision level.  The patient has hired caregiver to provide 24-hour supervision. Patient has received home health rehab services, including SLP.  Patient Stated Goals  Patient would like t be independent in all tasks and be able to live alone again.    Currently in Pain?  No/denies      OT TREATMENT    Neuro muscular re-education:  Pt. worked on grasping one inch resistive cubes alternating thumb opposition to the tip of the 2nd digit. The board was positioned at a diagonal vertical angle. Pt. worked on pressing them back into place while isolating the 2nd digit. Pt. worked on bilateral [facility] skills needed to grasp small resistive beads. Pt. worked on connecting the beads using a 3pt. pinch, and pt. pinch grasp. Pt. worked on  disconnecting the resistive beads using a lateral pinch grasp, and 3pt. pinch grasp.   Selfcare:  Pt. worked on Estate agent tasks with improved legibility, as well as letter, and word formation. Pt. worked on check writing tasks while requiring increased time, and cues to complete. Pt. Worked on using a buttonhook to manipulate buttons on a Designer, fashion/clothing. Pt, struggled with each button secondary to the button hole being too small for smooth passage of button.                         OT Education - 02/28/18 2230    Education Details  handwriting, fine motor coordination    Person(s) Educated  Patient    Methods  Explanation;Demonstration;Verbal cues    Comprehension  Verbalized understanding;Returned demonstration;Verbal cues required          OT Long Term Goals - 01/22/18 1121      OT LONG TERM GOAL #1   Title  Patient will complete bathing with modified independence    Baseline  moderate assist    Time  12    Period  Weeks    Status  New      OT LONG TERM GOAL #2   Title  Patient will complete dressing skills with modified independence    Baseline  moderate assist    Time  12    Period  Weeks    Status  New      OT LONG TERM GOAL #3   Title  Patient will complete light meal prep with min assist    Baseline  max assist    Time  12    Period  Weeks    Status  New      OT LONG TERM GOAL #4   Title  Patient will increase R grip strength by 5# to open jars and containers with modified independence    Baseline  difficulty at eval    Time  12    Period  Weeks    Status  New      OT LONG TERM GOAL #5   Title  Patient will improve strength by 1 mm grade RUE to assist with obtaining items from the closet.     Baseline  4/5 overall RUE    Time  8    Period  Weeks    Status  New            Plan - 02/28/18 2231    Clinical Impression Statement  Pt. continues to work on improving UE strength, and [facility] skills for improved functional use during ADLs, and  IADLs. Pt. has improved with writing legibility, letter formation, spacing. Pt. continues to require work on improving writing speed in prepraration for check writing.    Occupational Profile and client history currently impacting functional performance  family more than 30 mins away, has 24 hour caregiver assist, decreased awareness, memory and safety    Occupational performance deficits (Please refer to evaluation for details):  ADL's;IADL's;Social Participation    Rehab Potential  Good    Current Impairments/barriers affecting progress:  memory, requires 24 hour caregivers,     OT Frequency  2x / week    OT Duration  12 weeks    OT Treatment/Interventions  Self-care/ADL training;Cryotherapy;Therapeutic exercise;DME and/or AE instruction;Functional Mobility Training;Cognitive remediation/compensation;Balance training;Neuromuscular education;Manual Therapy;Moist Heat;Therapeutic activities;Patient/family education    Clinical Decision Making  Several treatment options, min-mod task modification necessary    Consulted and Agree with Plan of Care  Patient       Patient will benefit from skilled therapeutic intervention in order to improve the following deficits and impairments:  Decreased balance, Decreased mobility, Difficulty walking, Decreased cognition, Decreased activity tolerance, Decreased coordination, Decreased safety awareness, Decreased strength, Impaired UE functional use  Visit Diagnosis: Muscle weakness (generalized)  Other lack of coordination    Problem List Patient Active Problem List   Diagnosis Date Noted  . Cognitive deficit, post-stroke 12/31/2017  . Ataxia, post-stroke 12/31/2017  . Dysarthria, post-stroke   . Gait disturbance, post-stroke   . H/O cerebral parenchymal hemorrhage 11/04/2017  . Essential hypertension 11/04/2017  . Hyperlipidemia 11/04/2017  . Diabetes ([place]) 11/04/2017  . CAD (coronary artery disease) 11/04/2017  . 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 Hypokalemia 11/04/2017  . 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 History of cervical cancer   . History of TIA (transient ischemic attack)   . Acute systolic congestive heart failure ([place])   . Reactive hypertension   . Hypernatremia   . Leukocytosis   . Acute blood loss anemia   . Elevated serum creatinine   . Acute respiratory failure with hypoxia ([place])   . IVH (intraventricular hemorrhage) ([place]) 10/29/2017  . Hypoxia 10/28/2017  . Pancreatic lesion 05/24/2017  . 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 Nodule of upper lobe of left lung 06/04/2015  . 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 Cerebral thrombosis with cerebral infarction 04/03/2015  . Cancer of right colon ([place]) 03/28/2015  . CVA (cerebral infarction) 12/21/2014  . Polycythemia vera ([place]) 06/22/2006    Harrel Carina, MS, OTR/L 02/28/2018, 10:36 PM  [place] MAIN [facility] SERVICES [address] [place], [place], [address] Phone: [phone]   Fax:  [phone]  Name: [name] MRN: [id number] Date of Birth: 1931/12/06

## 2018-02-28 NOTE — Telephone Encounter (Signed)
-----   Message from Lequita Asal, MD sent at 02/27/2018  4:49 PM EDT ----- Regarding: Please confirm patient does not...  Please confirm patient does not have stent or other metal object that would preclude the MRI scheduled in 03/2018.  M

## 2018-03-01 ENCOUNTER — Telehealth: Payer: Self-pay | Admitting: *Deleted

## 2018-03-01 ENCOUNTER — Other Ambulatory Visit: Payer: Self-pay | Admitting: Urgent Care

## 2018-03-01 ENCOUNTER — Encounter: Payer: Self-pay | Admitting: Speech Pathology

## 2018-03-01 ENCOUNTER — Encounter: Payer: Self-pay | Admitting: Hematology and Oncology

## 2018-03-01 ENCOUNTER — Ambulatory Visit: Payer: Medicare Other | Admitting: Physical Therapy

## 2018-03-01 ENCOUNTER — Ambulatory Visit: Payer: Medicare Other | Admitting: Speech Pathology

## 2018-03-01 ENCOUNTER — Encounter: Payer: Self-pay | Admitting: Physical Therapy

## 2018-03-01 DIAGNOSIS — R41841 Cognitive communication deficit: Secondary | ICD-10-CM

## 2018-03-01 DIAGNOSIS — I10 Essential (primary) hypertension: Secondary | ICD-10-CM

## 2018-03-01 DIAGNOSIS — K869 Disease of pancreas, unspecified: Secondary | ICD-10-CM

## 2018-03-01 DIAGNOSIS — R262 Difficulty in walking, not elsewhere classified: Secondary | ICD-10-CM

## 2018-03-01 DIAGNOSIS — M6281 Muscle weakness (generalized): Secondary | ICD-10-CM | POA: Diagnosis not present

## 2018-03-01 DIAGNOSIS — C182 Malignant neoplasm of ascending colon: Secondary | ICD-10-CM

## 2018-03-01 DIAGNOSIS — I69123 Fluency disorder following nontraumatic intracerebral hemorrhage: Secondary | ICD-10-CM

## 2018-03-01 NOTE — Therapy (Signed)
[place] MAIN [facility] SERVICES [address] [place], [place], [address] Phone: [phone]   Fax:  [phone]  Speech Language Pathology Treatment  Patient Details  Name: [name] MRN: [id number] Date of Birth: 1931-08-22 Referring Provider: Charlett Blake    Encounter Date: 03/01/2018  End of Session - 03/01/18 1613    Visit Number  9    Number of Visits  17    Date for SLP Re-Evaluation  03/21/18    SLP Start Time  1500    SLP Stop Time   1554    SLP Time Calculation (min)  54 min    Activity Tolerance  Patient tolerated treatment well       Past Medical History:  Diagnosis Date  . Adenocarcinoma in situ of cervix   . Arthritis    hands  . Breast cancer ([place])   . Colon cancer ([place])   . Diabetes mellitus without complication ([place])   . Endometrial carcinoma (HCC)    s/p total abdominal hysterectomy  . H/O compression fracture of spine 2014   thoracic spine  . H/O polycythemia vera   . H/O TIA (transient ischemic attack) and stroke 09/2014, 03/2015   No deficits  . Hypertension   . Hyperuricemia   . Microalbuminuria   . Polycythemia vera ([place])   . Recurrent falls   . Skin cancer    face, legs  . Stroke [facility]) 2008   no deficits  . Trochanteric bursitis   . Varicose veins    treated    Past Surgical History:  Procedure Laterality Date  . ABDOMINAL HYSTERECTOMY    . BOWEL RESECTION N/A 03/28/2015   Procedure: SMALL BOWEL RESECTION;  Surgeon: Leonie Green, MD;  Location: [facility] ORS;  Service: General;  Laterality: N/A;  . CATARACT EXTRACTION W/ INTRAOCULAR LENS IMPLANT Right   . CATARACT EXTRACTION W/PHACO Left 10/07/2015   Procedure: CATARACT EXTRACTION PHACO AND INTRAOCULAR LENS PLACEMENT (IOC);  Surgeon: Ronnell Freshwater, MD;  Location: [place];  Service: Ophthalmology;  Laterality: Left;  DIABETIC - oral meds VISION BLUE  . CORONARY ANGIOGRAPHY N/A 10/29/2017   Procedure: CORONARY  ANGIOGRAPHY;  Surgeon: Dionisio David, MD;  Location: [place] CV LAB;  Service: Cardiovascular;  Laterality: N/A;  . EXPLORATORY LAPAROTOMY     for fibroids  . LEFT HEART CATH Right 10/29/2017   Procedure: Left Heart Cath;  Surgeon: Dionisio David, MD;  Location: [place] CV LAB;  Service: Cardiovascular;  Laterality: Right;  . TONSILLECTOMY      There were no vitals filed for this visit.  Subjective Assessment - 03/01/18 1613    Subjective  "I'm so tired after PT"            ADULT SLP TREATMENT - 03/01/18 0001      General Information   Behavior/Cognition  Alert;Cooperative;Pleasant mood    HPI   84 year old female with adenocarcinoma of the cervix as well as endometrial carcinoma and polycythemia vera as well as diabetes who had hypertensive right cerebellar hemorrhage onset 10/28/2017.  She was treated initially at [facility] and then transferred to [facility].  Neurosurgical evaluation per Dr. Ronnald Ramp concluded no surgery was needed.  The patient completed inpatient rehabilitation at [facility] (including SLP) 11/04/2017-11/18/2107 and was discharged at a 24-hour supervision level.  The patient has hired caregiver to provide 24-hour supervision. Patient has received home health rehab services, including SLP.  Treatment Provided   Treatment provided  Cognitive-Linquistic      Pain Assessment   Pain Assessment  No/denies pain      Cognitive-Linquistic Treatment   Treatment focused on  Apraxia;Aphasia    Skilled Treatment  HANDWRITING: the patient is concerned regarding her handwriting.  Legibility improves with lined paper to encourage larger letters.  Patient returned with homework showing much improved legibility. Patient able to complete worksheet in session with 100% legibility.  FLUENCY: spontaneous speech is notable for significant episodes of dysfluency.  The patient is 100% fluent when reading words and simple sentences.  Patient is 85%  fluency in simple cognitive linguistic task and 75% fluent in more abstract cognitive linguistic task.  The patient is able to state that slowing her speech would be helpful, but she "always talked fast".  WORD FINDING: Patient is able to complete moderately complex word finding worksheet with 80% accuracy.      Assessment / Recommendations / Plan   Plan  Continue with current plan of care      Progression Toward Goals   Progression toward goals  Progressing toward goals       SLP Education - 03/01/18 1613    Education Details  slow speech to improve fluency    Person(s) Educated  Patient    Methods  Explanation    Comprehension  Verbalized understanding         SLP Long Term Goals - 02/22/18 1454      SLP LONG TERM GOAL #1   Title  Patient will generate fluent, grammatical, and cogent sentence to complete simple/concrete linguistic task with 80% accuracy.    Status  Achieved      SLP LONG TERM GOAL #2   Title  Patient will generate grammatical, fluent, and cogent sentences to complete abstract/complex linguistic task with 80% accuracy.    Status  Partially Met    Target Date  03/21/18      SLP LONG TERM GOAL #3   Title  Patient will identify cognitive-communication barriers and participate in developing functional compensatory strategies.    Status  Deferred      SLP LONG TERM GOAL #4   Title  Patient will complete moderately complex executive function skills tasks with 80% accuracy.    Status  Deferred       Plan - 03/01/18 1615    Clinical Impression Statement  The patient is most fluent under very controlled setting, such as reading aloud.  Her fluency decreases as the cognitive load of the task increases.  She is generating more legible handwriting.  Will benefit continue ST to address self-expression, fluency, and word finding.    Speech Therapy Frequency  2x / week    Duration  Other (comment)    Treatment/Interventions  Compensatory strategies;Patient/family  education;Cognitive reorganization;Language facilitation;Internal/external aids    Potential to Achieve Goals  Good    Potential Considerations  Ability to learn/carryover information;Pain level;Family/community support;Co-morbidities;Previous level of function;Cooperation/participation level;Severity of impairments;Medical prognosis    SLP Home Exercise Plan  writing, word finding work Financial trader and Agree with Plan of Care  Patient       Patient will benefit from skilled therapeutic intervention in order to improve the following deficits and impairments:   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 Cognitive deficit, post-stroke 12/31/2017  . Ataxia, post-stroke 12/31/2017  . Dysarthria,  post-stroke   . Gait disturbance, post-stroke   . H/O cerebral parenchymal hemorrhage 11/04/2017  . Essential hypertension 11/04/2017  . Hyperlipidemia 11/04/2017  . Diabetes ([place]) 11/04/2017  . CAD (coronary artery disease) 11/04/2017  . 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 Hypokalemia 11/04/2017  . Right-sided nontraumatic intracerebral hemorrhage of cerebellum ([place])   . History of cervical cancer   . History of TIA (transient ischemic attack)   . Acute systolic congestive heart failure ([place])   . Reactive hypertension   . Hypernatremia   . Leukocytosis   . Acute blood loss anemia   . Elevated serum creatinine   . Acute respiratory failure with hypoxia ([place])   . IVH (intraventricular hemorrhage) ([place]) 10/29/2017  . Hypoxia 10/28/2017  . Pancreatic lesion 05/24/2017  . 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 Nodule of upper lobe of left lung 06/04/2015  . 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 Cerebral thrombosis with cerebral infarction 04/03/2015  . Cancer of right colon ([place]) 03/28/2015  . CVA (cerebral infarction)  12/21/2014  . Polycythemia vera ([place]) 06/22/2006   Leroy Sea, MS/CCC- SLP  Lou Miner 03/01/2018, 4:15 PM  [place] MAIN [facility] SERVICES [address] [place], [place], [address] Phone: [phone]   Fax:  [phone]   Name: [name] MRN: [id number] Date of Birth: 02-03-1932

## 2018-03-01 NOTE — Telephone Encounter (Signed)
Attempted to call patient no answer. Will try again.

## 2018-03-01 NOTE — Therapy (Addendum)
[place] MAIN [facility] SERVICES [address] [place], [place], [address] Phone: [phone]   Fax:  [phone]  Physical Therapy Treatment  Patient Details  Name: [name] MRN: [id number] Date of Birth: 1932-06-01 Referring Provider: Charlett Blake   Encounter Date: 03/01/2018  PT End of Session - 03/01/18 1454    Visit Number  5    Number of Visits  25    Date for PT Re-Evaluation  04/25/18    Authorization Type  5/10 with [facility] 01/31/18    PT Start Time  1400    PT Stop Time  1445    PT Time Calculation (min)  45 min    Equipment Utilized During Treatment  Gait belt    Activity Tolerance  Patient tolerated treatment well    Behavior During Therapy  [facility] for tasks assessed/performed       Past Medical History:  Diagnosis Date  . Adenocarcinoma in situ of cervix   . Arthritis    hands  . Breast cancer ([place])   . Colon cancer ([place])   . Diabetes mellitus without complication ([place])   . Endometrial carcinoma (HCC)    s/p total abdominal hysterectomy  . H/O compression fracture of spine 2014   thoracic spine  . H/O polycythemia vera   . H/O TIA (transient ischemic attack) and stroke 09/2014, 03/2015   No deficits  . Hypertension   . Hyperuricemia   . Microalbuminuria   . Polycythemia vera ([place])   . Recurrent falls   . Skin cancer    face, legs  . Stroke [facility]) 2008   no deficits  . Trochanteric bursitis   . Varicose veins    treated    Past Surgical History:  Procedure Laterality Date  . ABDOMINAL HYSTERECTOMY    . BOWEL RESECTION N/A 03/28/2015   Procedure: SMALL BOWEL RESECTION;  Surgeon: Leonie Green, MD;  Location: [facility] ORS;  Service: General;  Laterality: N/A;  . CATARACT EXTRACTION W/ INTRAOCULAR LENS IMPLANT Right   . CATARACT EXTRACTION W/PHACO Left 10/07/2015   Procedure: CATARACT EXTRACTION PHACO AND INTRAOCULAR LENS PLACEMENT (IOC);  Surgeon: Ronnell Freshwater, MD;  Location: [place];  Service: Ophthalmology;  Laterality: Left;  DIABETIC - oral meds VISION BLUE  . CORONARY ANGIOGRAPHY N/A 10/29/2017   Procedure: CORONARY ANGIOGRAPHY;  Surgeon: Dionisio David, MD;  Location: [place] CV LAB;  Service: Cardiovascular;  Laterality: N/A;  . EXPLORATORY LAPAROTOMY     for fibroids  . LEFT HEART CATH Right 10/29/2017   Procedure: Left Heart Cath;  Surgeon: Dionisio David, MD;  Location: [place] CV LAB;  Service: Cardiovascular;  Laterality: Right;  . TONSILLECTOMY      There were no vitals filed for this visit.  Subjective Assessment - 03/01/18 1402    Subjective  Patient reports she is doing well; reports some soreness in B knees today. Pt denies any new falls.     Pertinent History    84 year old female with adenocarcinoma of the cervix as well as endometrial carcinoma and polycythemia vera as well as diabetes who had hypertensive right cerebellar hemorrhage onset 10/28/2017.  She was treated initially at [facility] and then transferred to [facility].  Neurosurgical evaluation per Dr. Ronnald Ramp concluded no surgery was needed.  The patient completed inpatient rehabilitation at [facility] (including SLP) 11/04/2017-11/18/2107 and was discharged at a 24-hour supervision level.  The patient has hired caregiver to provide 24-hour  supervision. Patient has received home health rehab services, including SLP.    Limitations  Standing;Walking    How long can you walk comfortably?  a few minutes    Patient Stated Goals  Patient wants to walk better and not need the RW.     Currently in Pain?  Yes    Pain Score  3     Pain Location  Knee    Pain Orientation  Right   Some soreness in L knee but not as bad as R and doesn't prevent any activity    Pain Descriptors / Indicators  Sore    Pain Type  Chronic pain    Pain Onset  More than a month ago    Pain Frequency  Intermittent    Aggravating Factors   getting up and down    Pain Relieving Factors  "stop  what I'm doing"    Effect of Pain on Daily Activities  decreased standing tolerance and balance is not good    Multiple Pain Sites  No        Treatment Warm up on Nustep level 2 x4 min; VCs to maintain SPM close to 60 to work on improving activity tolerance  Standing in parallel bars: Standing on airex: -toe taps to 4 inch step without UE assist x15 reps bilaterally; -standing one foot on step, Alternate BUE ball pass side/side x10 reps each foot on step; -mini squat unsupported 2x10 reps; -heel/toe raises x15 reps without UE assist; -Alternate march x15 bilaterally with cues to increase hip flexion for better ROM/strength; -standing, head turns side/side, up/down x5 reps each;  -ball tosses to self x15 tosses, demonstrated some difficulty coordinating tosses with maintaining balance but demonstrated improved postural control following first 5 tosses VCs for proper technique and positioning during all activities, CGA-min A for all activities to assist in minor posterior LOB    Balloon passes standing on purple airex pad, x1 min bouts x2 bouts with standing rest between bouts, VCs to tap balloon with both hands and CGA for safety; pt demonstrated ability to reach across body with R hand to tap balloon and reported enjoying the exercise as well    Forwards/backwards stepping over 1/2 foam roll x10 reps each direction, CGA for safety, VCs for lifting up feet high enough to clear the bolster  Side-to-side stepping over 1/2 foam roll x10 reps each direction, CGA for safety, VCs for taking large enough step to get both feet over the bolster   BLE leg press 45# x15 reps, 60# x15 reps, VCs for slowing eccentric return and min A for positioning LEs onto footplate                       PT Education - 03/01/18 1454    Education Details  exercise technique/form    Person(s) Educated  Patient    Methods  Explanation;Demonstration;Verbal cues    Comprehension  Verbalized  understanding;Returned demonstration;Verbal cues required;Need further instruction       PT Short Term Goals - 01/31/18 1120      PT SHORT TERM GOAL #1   Title  Patient will reduce timed up and go to <11 seconds to reduce fall risk and demonstrate improved transfer/gait ability.    Baseline  22.07 sec    Time  6    Period  Weeks    Status  New    Target Date  02/28/18      PT SHORT TERM GOAL #2  Title  Patient will be independent with ascend/descend 2 steps using single UE in step over step pattern without LOB.    Time  6    Period  Weeks    Status  New    Target Date  02/28/18      PT SHORT TERM GOAL #3   Time  6    Target Date  03/14/18        PT Long Term Goals - 01/31/18 1119      PT LONG TERM GOAL #1   Title  Patient will be independent in home exercise program to improve strength/mobility for better functional independence with ADLs.    Time  12    Period  Weeks    Status  New    Target Date  04/25/18      PT LONG TERM GOAL #2   Title  Patient (> 67 years old) will complete five times sit to stand test in < 15 seconds indicating an increased LE strength and improved balance    Baseline  34.21 sec    Time  12    Period  Weeks    Status  New    Target Date  04/25/18      PT LONG TERM GOAL #3   Title  Patient will increase six minute walk test distance to >1000 for progression to community ambulator and improve gait ability    Baseline  550 feet    Time  12    Period  Weeks    Status  New    Target Date  04/25/18      PT LONG TERM GOAL #4   Title  Patient will increase 10 meter walk test to >1.16m/s as to improve gait speed for better community ambulation and to reduce fall risk    Baseline  .49 m/sec    Time  12    Period  Weeks    Status  New    Target Date  04/25/18            Plan - 03/01/18 1455    Clinical Impression Statement  Patient tolerated therapy session well and performed dynamic postural control exercises as well as LE  strengthening. Pt demonstrated some posterior leaning and minor LOB when standing on compliant surfaces while performing ball tosses to self. Pt demonstrated decreased use of L hand when performing balloon passes but was able to reach across body with RUE to tap balloon without losing balance; CGA for safety during balloon passes with VCs for reaching but continuing to maintain balance. Pt required VCs for proper technique and positioning in LE strengthening exercises as well as CGA for safety. Pt would continue to benefit from skilled PT intervention for improvements in balance, strength, and gait safety.     Rehab Potential  Fair    PT Frequency  2x / week    PT Duration  12 weeks    PT Treatment/Interventions  Patient/family education;Neuromuscular re-education;Balance training;Stair training;Therapeutic activities;Therapeutic exercise;Gait training;Aquatic Therapy    PT Next Visit Plan  balance and strengthening    PT Home Exercise Plan  unable to provide due to short Evaluation session scheduled    Consulted and Agree with Plan of Care  Patient;Family member/caregiver       Patient will benefit from skilled therapeutic intervention in order to improve the following deficits and impairments:  Abnormal gait, Decreased balance, Decreased endurance, Decreased mobility, Impaired flexibility, Decreased strength, Decreased knowledge of use of DME, Decreased activity  tolerance, Difficulty walking  Visit Diagnosis: Muscle weakness (generalized)  Difficulty in walking, not elsewhere classified     Problem List Patient Active Problem List   Diagnosis Date Noted  . Cognitive deficit, post-stroke 12/31/2017  . Ataxia, post-stroke 12/31/2017  . Dysarthria, post-stroke   . Gait disturbance, post-stroke   . H/O cerebral parenchymal hemorrhage 11/04/2017  . Essential hypertension 11/04/2017  . Hyperlipidemia 11/04/2017  . Diabetes ([place]) 11/04/2017  . CAD (coronary artery disease) 11/04/2017  .  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 Hypokalemia 11/04/2017  . 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 History of cervical cancer   . History of TIA (transient ischemic attack)   . Acute systolic congestive heart failure ([place])   . Reactive hypertension   . Hypernatremia   . Leukocytosis   . Acute blood loss anemia   . Elevated serum creatinine   . Acute respiratory failure with hypoxia ([place])   . IVH (intraventricular hemorrhage) ([place]) 10/29/2017  . Hypoxia 10/28/2017  . Pancreatic lesion 05/24/2017  . 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 Nodule of upper lobe of left lung 06/04/2015  . 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 Cerebral thrombosis with cerebral infarction 04/03/2015  . Cancer of right colon ([place]) 03/28/2015  . CVA (cerebral infarction) 12/21/2014  . Polycythemia vera ([place]) 06/22/2006   Harriet Masson, SPT  This entire session was performed under direct supervision and direction of a licensed therapist/therapist assistant . I have personally read, edited and approve of the note as written. Collie Siad PT, DPT 03/01/2018, 3:07 PM  [place] MAIN [facility] SERVICES [address] [place], [place], [address] Phone: [phone]   Fax:  [phone]  Name: [name] MRN: [id number] Date of Birth: [birth year]

## 2018-03-01 NOTE — Progress Notes (Signed)
Patient to be seen in clinic on 03/07/2018, followed by a visit to her PCP on 03/08/2018. Family is asking for labs to be combined in order to minimize the need for multiple venipunctures. PCP requesting some of the same labs that we will be drawing anyway. The only lab that is different is a Hgb A1c. Will oblige request for labs to be obtained here. Patient to have CBC with diff, CMP, Hgb A1c.   I have updated the patient's family. They were appreciative of the collaborative efforts between PCP and oncology practice.   Honor Loh, MSN, APRN, FNP-C, CEN Oncology/Hematology Nurse Practitioner  [facility] 03/01/18, 2:02 PM

## 2018-03-01 NOTE — Telephone Encounter (Signed)
-----   Message from Lequita Asal, MD sent at 02/27/2018  4:49 PM EDT ----- Regarding: Please confirm patient does not...  Please confirm patient does not have stent or other metal object that would preclude the MRI scheduled in 03/2018.  M

## 2018-03-02 ENCOUNTER — Telehealth: Payer: Self-pay | Admitting: *Deleted

## 2018-03-02 NOTE — Telephone Encounter (Signed)
Called patient's niece, Vito Berger and LVM for her to call me back to let me know if patient has any metal in her body  Patient is scheduled for MRI in October.

## 2018-03-02 NOTE — Telephone Encounter (Signed)
-----   Message from Lequita Asal, MD sent at 02/27/2018  4:49 PM EDT ----- Regarding: Please confirm patient does not...  Please confirm patient does not have stent or other metal object that would preclude the MRI scheduled in 03/2018.  M

## 2018-03-03 ENCOUNTER — Ambulatory Visit: Payer: Medicare Other | Admitting: Speech Pathology

## 2018-03-03 ENCOUNTER — Encounter: Payer: Self-pay | Admitting: Physical Therapy

## 2018-03-03 ENCOUNTER — Encounter: Payer: Self-pay | Admitting: Speech Pathology

## 2018-03-03 ENCOUNTER — Telehealth: Payer: Self-pay | Admitting: *Deleted

## 2018-03-03 ENCOUNTER — Ambulatory Visit: Payer: Medicare Other | Admitting: Physical Therapy

## 2018-03-03 DIAGNOSIS — M6281 Muscle weakness (generalized): Secondary | ICD-10-CM | POA: Diagnosis not present

## 2018-03-03 DIAGNOSIS — R41841 Cognitive communication deficit: Secondary | ICD-10-CM

## 2018-03-03 DIAGNOSIS — I69123 Fluency disorder following nontraumatic intracerebral hemorrhage: Secondary | ICD-10-CM

## 2018-03-03 DIAGNOSIS — R262 Difficulty in walking, not elsewhere classified: Secondary | ICD-10-CM

## 2018-03-03 NOTE — Therapy (Addendum)
[place] MAIN [facility] SERVICES [address] [place], [place], [address] Phone: [phone]   Fax:  [phone]  Physical Therapy Treatment  Patient Details  Name: [name] MRN: [id number] Date of Birth: 11-Nov-1931 Referring Provider: Charlett Blake   Encounter Date: 03/03/2018  PT End of Session - 03/03/18 1244    Visit Number  6    Number of Visits  25    Date for PT Re-Evaluation  04/25/18    Authorization Type  6/10 with [facility] 01/31/18    PT Start Time  1300    PT Stop Time  1345    PT Time Calculation (min)  45 min    Equipment Utilized During Treatment  Gait belt    Activity Tolerance  Patient tolerated treatment well    Behavior During Therapy  [facility] for tasks assessed/performed       Past Medical History:  Diagnosis Date  . Adenocarcinoma in situ of cervix   . Arthritis    hands  . Breast cancer ([place])   . Colon cancer ([place])   . Diabetes mellitus without complication ([place])   . Endometrial carcinoma (HCC)    s/p total abdominal hysterectomy  . H/O compression fracture of spine 2014   thoracic spine  . H/O polycythemia vera   . H/O TIA (transient ischemic attack) and stroke 09/2014, 03/2015   No deficits  . Hypertension   . Hyperuricemia   . Microalbuminuria   . Polycythemia vera ([place])   . Recurrent falls   . Skin cancer    face, legs  . Stroke [facility]) 2008   no deficits  . Trochanteric bursitis   . Varicose veins    treated    Past Surgical History:  Procedure Laterality Date  . ABDOMINAL HYSTERECTOMY    . BOWEL RESECTION N/A 03/28/2015   Procedure: SMALL BOWEL RESECTION;  Surgeon: Leonie Green, MD;  Location: [facility] ORS;  Service: General;  Laterality: N/A;  . CATARACT EXTRACTION W/ INTRAOCULAR LENS IMPLANT Right   . CATARACT EXTRACTION W/PHACO Left 10/07/2015   Procedure: CATARACT EXTRACTION PHACO AND INTRAOCULAR LENS PLACEMENT (IOC);  Surgeon: Ronnell Freshwater, MD;  Location: [place];  Service: Ophthalmology;  Laterality: Left;  DIABETIC - oral meds VISION BLUE  . CORONARY ANGIOGRAPHY N/A 10/29/2017   Procedure: CORONARY ANGIOGRAPHY;  Surgeon: Dionisio David, MD;  Location: [place] CV LAB;  Service: Cardiovascular;  Laterality: N/A;  . EXPLORATORY LAPAROTOMY     for fibroids  . LEFT HEART CATH Right 10/29/2017   Procedure: Left Heart Cath;  Surgeon: Dionisio David, MD;  Location: [place] CV LAB;  Service: Cardiovascular;  Laterality: Right;  . TONSILLECTOMY      There were no vitals filed for this visit.  Subjective Assessment - 03/03/18 1311    Subjective  Patient reports she is doing well today; no pain or falls since last visit.     Pertinent History    84 year old female with adenocarcinoma of the cervix as well as endometrial carcinoma and polycythemia vera as well as diabetes who had hypertensive right cerebellar hemorrhage onset 10/28/2017.  She was treated initially at [facility] and then transferred to [facility].  Neurosurgical evaluation per Dr. Ronnald Ramp concluded no surgery was needed.  The patient completed inpatient rehabilitation at [facility] (including SLP) 11/04/2017-11/18/2107 and was discharged at a 24-hour supervision level.  The patient has hired caregiver to provide 24-hour supervision. Patient has received  home health rehab services, including SLP.    Limitations  Standing;Walking    How long can you walk comfortably?  a few minutes    Patient Stated Goals  Patient wants to walk better and not need the RW.     Currently in Pain?  No/denies        Treatment Warm up walking around the hospital about 800 ft with RW, supervision for safety, occasional VCs to maintain upright posture to work on activity tolerance and endurance   Standing in parallel bars: Standing on airex: -toe taps to 6 inch step without UE assist x15 reps bilaterally; -standing one foot on step, alternateBUE ball pass side/sidex10reps each  foot on step; -ball tosses to self x15 tosses, demonstrated some difficulty coordinating tosses with maintaining balance but demonstrated improved postural control following first 5 tosses VCs for proper technique and positioning during all activities, CGA-min A for all activities to assist in minor posterior LOB   Blue airex balance beam: -Tandem walking x2 laps, first lap with fingertip support and second lap without UE support, CGA for safety with VCs and demonstration for proper technique and sequencing -Side stepping x2 laps with single UE support, CGA for safety with VCs for increasing step length and maintaining balance in center of feet  Forwards/backwards stepping over6 inch hurdle2x10 reps each direction, CGA for safety, VCs for lifting up feet high enough to clear the bolster  Forward lunges onto dynadisk x10 reps, VCs for shifting weight forwards and maintaining balance utilizing ankles without UE support, CGA for safety  Side lunges onto dynadisk into single leg stance x10 reps each side, CGA-min A for safety, VCs for shifting weight, faded UE support in standing  BLE leg press 45# x10 reps, 60# x10 reps, VCs for slowing eccentric return and min A for positioning LEs onto footplate            PT Education - 03/03/18 1244    Education Details  exercise technique/form    Person(s) Educated  Patient    Methods  Explanation;Demonstration;Verbal cues    Comprehension  Verbalized understanding;Returned demonstration;Verbal cues required;Need further instruction       PT Short Term Goals - 01/31/18 1120      PT SHORT TERM GOAL #1   Title  Patient will reduce timed up and go to <11 seconds to reduce fall risk and demonstrate improved transfer/gait ability.    Baseline  22.07 sec    Time  6    Period  Weeks    Status  New    Target Date  02/28/18      PT SHORT TERM GOAL #2   Title  Patient will be independent with ascend/descend 2 steps using single UE in step over  step pattern without LOB.    Time  6    Period  Weeks    Status  New    Target Date  02/28/18      PT SHORT TERM GOAL #3   Time  6    Target Date  03/14/18        PT Long Term Goals - 01/31/18 1119      PT LONG TERM GOAL #1   Title  Patient will be independent in home exercise program to improve strength/mobility for better functional independence with ADLs.    Time  12    Period  Weeks    Status  New    Target Date  04/25/18      PT  LONG TERM GOAL #2   Title  Patient (> 73 years old) will complete five times sit to stand test in < 15 seconds indicating an increased LE strength and improved balance    Baseline  34.21 sec    Time  12    Period  Weeks    Status  New    Target Date  04/25/18      PT LONG TERM GOAL #3   Title  Patient will increase six minute walk test distance to >1000 for progression to community ambulator and improve gait ability    Baseline  550 feet    Time  12    Period  Weeks    Status  New    Target Date  04/25/18      PT LONG TERM GOAL #4   Title  Patient will increase 10 meter walk test to >1.59m/s as to improve gait speed for better community ambulation and to reduce fall risk    Baseline  .49 m/sec    Time  12    Period  Weeks    Status  New    Target Date  04/25/18            Plan - 03/03/18 1354    Clinical Impression Statement  Patient tolerated therapy session well. Pt ambulated around the hospital about 800 ft with RW without any LOB noted; supervision for safety with very occasional VCs to maintain upright posture. Pt demonstrated improved postural control when standing on compliant surfaces; CGA for safety with VCs for positioning and technique when performing exercises. Pt demonstrated some lateral lean when performing tandem walking on blue airex balance beam but decreased UE support on second lap. Pt ambulated in // bars without UE support forwards with CGA for safety with one lateral LOB noted which she caught with use of single  UE; would benefit from beginning gait training with [facility] to begin decreasing RW use in therapy session. Pt will continue to benefit from skilled PT intervention for improvements in balance, strength, and gait safety.     Rehab Potential  Fair    PT Frequency  2x / week    PT Duration  12 weeks    PT Treatment/Interventions  Patient/family education;Neuromuscular re-education;Balance training;Stair training;Therapeutic activities;Therapeutic exercise;Gait training;Aquatic Therapy    PT Next Visit Plan  balance and strengthening    PT Home Exercise Plan  unable to provide due to short Evaluation session scheduled    Consulted and Agree with Plan of Care  Patient;Family member/caregiver       Patient will benefit from skilled therapeutic intervention in order to improve the following deficits and impairments:  Abnormal gait, Decreased balance, Decreased endurance, Decreased mobility, Impaired flexibility, Decreased strength, Decreased knowledge of use of DME, Decreased activity tolerance, Difficulty walking  Visit Diagnosis: Muscle weakness (generalized)  Difficulty in walking, not elsewhere classified     Problem List Patient Active Problem List   Diagnosis Date Noted  . Cognitive deficit, post-stroke 12/31/2017  . Ataxia, post-stroke 12/31/2017  . Dysarthria, post-stroke   . Gait disturbance, post-stroke   . H/O cerebral parenchymal hemorrhage 11/04/2017  . Essential hypertension 11/04/2017  . Hyperlipidemia 11/04/2017  . Diabetes ([place]) 11/04/2017  . CAD (coronary artery disease) 11/04/2017  . 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 Hypokalemia 11/04/2017  . Right-sided nontraumatic intracerebral hemorrhage of cerebellum ([place])   . History of cervical cancer   . History of TIA (transient ischemic attack)   . Acute systolic  congestive heart failure ([place])   . Reactive hypertension   . Hypernatremia   . Leukocytosis   . Acute blood loss anemia   . Elevated serum creatinine   .  Acute respiratory failure with hypoxia ([place])   . IVH (intraventricular hemorrhage) ([place]) 10/29/2017  . Hypoxia 10/28/2017  . Pancreatic lesion 05/24/2017  . 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 Nodule of upper lobe of left lung 06/04/2015  . 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 Cerebral thrombosis with cerebral infarction 04/03/2015  . Cancer of right colon ([place]) 03/28/2015  . CVA (cerebral infarction) 12/21/2014  . Polycythemia vera ([place]) 06/22/2006   Harriet Masson, SPT This entire session was performed under direct supervision and direction of a licensed therapist/therapist assistant . I have personally read, edited and approve of the note as written.  Trotter,Margaret PT, DPT 03/03/2018, 3:17 PM  [place] MAIN [facility] SERVICES [address] [place], [place], [address] Phone: [phone]   Fax:  [phone]  Name: FINLAY MILLS MRN: [id number] Date of Birth: November 23, 1931

## 2018-03-03 NOTE — Therapy (Signed)
[place] MAIN [facility] SERVICES [address] [place], [place], [address] Phone: [phone]   Fax:  [phone]  Speech Language Pathology Treatment  Patient Details  Name: [name] MRN: [id number] Date of Birth: 11-Mar-1932 Referring Provider: Charlett Blake    Encounter Date: 03/03/2018  End of Session - 03/03/18 1550    Visit Number  10    Number of Visits  17    Date for SLP Re-Evaluation  03/21/18    SLP Start Time  1400    SLP Stop Time   1451    SLP Time Calculation (min)  51 min    Activity Tolerance  Patient tolerated treatment well       Past Medical History:  Diagnosis Date  . Adenocarcinoma in situ of cervix   . Arthritis    hands  . Breast cancer ([place])   . Colon cancer ([place])   . Diabetes mellitus without complication ([place])   . Endometrial carcinoma (HCC)    s/p total abdominal hysterectomy  . H/O compression fracture of spine 2014   thoracic spine  . H/O polycythemia vera   . H/O TIA (transient ischemic attack) and stroke 09/2014, 03/2015   No deficits  . Hypertension   . Hyperuricemia   . Microalbuminuria   . Polycythemia vera ([place])   . Recurrent falls   . Skin cancer    face, legs  . Stroke [facility]) 2008   no deficits  . Trochanteric bursitis   . Varicose veins    treated    Past Surgical History:  Procedure Laterality Date  . ABDOMINAL HYSTERECTOMY    . BOWEL RESECTION N/A 03/28/2015   Procedure: SMALL BOWEL RESECTION;  Surgeon: Leonie Green, MD;  Location: [facility] ORS;  Service: General;  Laterality: N/A;  . CATARACT EXTRACTION W/ INTRAOCULAR LENS IMPLANT Right   . CATARACT EXTRACTION W/PHACO Left 10/07/2015   Procedure: CATARACT EXTRACTION PHACO AND INTRAOCULAR LENS PLACEMENT (IOC);  Surgeon: Ronnell Freshwater, MD;  Location: [place];  Service: Ophthalmology;  Laterality: Left;  DIABETIC - oral meds VISION BLUE  . CORONARY ANGIOGRAPHY N/A 10/29/2017   Procedure: CORONARY  ANGIOGRAPHY;  Surgeon: Dionisio David, MD;  Location: [place] CV LAB;  Service: Cardiovascular;  Laterality: N/A;  . EXPLORATORY LAPAROTOMY     for fibroids  . LEFT HEART CATH Right 10/29/2017   Procedure: Left Heart Cath;  Surgeon: Dionisio David, MD;  Location: [place] CV LAB;  Service: Cardiovascular;  Laterality: Right;  . TONSILLECTOMY      There were no vitals filed for this visit.  Subjective Assessment - 03/03/18 1550    Subjective  "I'm so tired after PT"            ADULT SLP TREATMENT - 03/03/18 0001      General Information   Behavior/Cognition  Alert;Cooperative;Pleasant mood    HPI   84 year old female with adenocarcinoma of the cervix as well as endometrial carcinoma and polycythemia vera as well as diabetes who had hypertensive right cerebellar hemorrhage onset 10/28/2017.  She was treated initially at [facility] and then transferred to [facility].  Neurosurgical evaluation per Dr. Ronnald Ramp concluded no surgery was needed.  The patient completed inpatient rehabilitation at [facility] (including SLP) 11/04/2017-11/18/2107 and was discharged at a 24-hour supervision level.  The patient has hired caregiver to provide 24-hour supervision. Patient has received home health rehab services, including SLP.  Treatment Provided   Treatment provided  Cognitive-Linquistic      Pain Assessment   Pain Assessment  No/denies pain      Cognitive-Linquistic Treatment   Treatment focused on  Apraxia;Aphasia    Skilled Treatment  HANDWRITING: the patient is concerned regarding her handwriting.  Legibility improves with lined paper to encourage larger letters.  Patient returned with homework showing much improved legibility. Patient able to complete worksheet in session with 100% legibility.  FLUENCY: spontaneous speech is notable for significant episodes of dysfluency.  The patient is 100% fluent when reading words and simple sentences.  Patient is 85%  fluency in simple cognitive linguistic task and 75% fluent in more abstract cognitive linguistic task.  The patient is able to state that slowing her speech would be helpful, but she "always talked fast".  WORD FINDING: Patient is able to complete moderately complex word finding worksheet with 80% accuracy.      Assessment / Recommendations / Plan   Plan  Continue with current plan of care      Progression Toward Goals   Progression toward goals  Progressing toward goals       SLP Education - 03/03/18 1550    Education Details  slow speech to improve fluency    Person(s) Educated  Patient    Methods  Explanation    Comprehension  Verbalized understanding         SLP Long Term Goals - 02/22/18 1454      SLP LONG TERM GOAL #1   Title  Patient will generate fluent, grammatical, and cogent sentence to complete simple/concrete linguistic task with 80% accuracy.    Status  Achieved      SLP LONG TERM GOAL #2   Title  Patient will generate grammatical, fluent, and cogent sentences to complete abstract/complex linguistic task with 80% accuracy.    Status  Partially Met    Target Date  03/21/18      SLP LONG TERM GOAL #3   Title  Patient will identify cognitive-communication barriers and participate in developing functional compensatory strategies.    Status  Deferred      SLP LONG TERM GOAL #4   Title  Patient will complete moderately complex executive function skills tasks with 80% accuracy.    Status  Deferred       Plan - 03/03/18 1551    Clinical Impression Statement  The patient is most fluent under very controlled setting, such as reading aloud.  Her fluency decreases as the cognitive load of the task increases.  She is generating more legible handwriting.  Will benefit continue ST to address self-expression, fluency, and word finding.    Speech Therapy Frequency  2x / week    Duration  Other (comment)    Treatment/Interventions  Compensatory strategies;Patient/family  education;Cognitive reorganization;Language facilitation;Internal/external aids    Potential to Achieve Goals  Good    Potential Considerations  Ability to learn/carryover information;Pain level;Family/community support;Co-morbidities;Previous level of function;Cooperation/participation level;Severity of impairments;Medical prognosis    SLP Home Exercise Plan  writing, word finding work Financial trader and Agree with Plan of Care  Patient       Patient will benefit from skilled therapeutic intervention in order to improve the following deficits and impairments:   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 Cognitive deficit, post-stroke 12/31/2017  . Ataxia, post-stroke 12/31/2017  . Dysarthria,  post-stroke   . Gait disturbance, post-stroke   . H/O cerebral parenchymal hemorrhage 11/04/2017  . Essential hypertension 11/04/2017  . Hyperlipidemia 11/04/2017  . Diabetes ([place]) 11/04/2017  . CAD (coronary artery disease) 11/04/2017  . 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 Hypokalemia 11/04/2017  . Right-sided nontraumatic intracerebral hemorrhage of cerebellum ([place])   . History of cervical cancer   . History of TIA (transient ischemic attack)   . Acute systolic congestive heart failure ([place])   . Reactive hypertension   . Hypernatremia   . Leukocytosis   . Acute blood loss anemia   . Elevated serum creatinine   . Acute respiratory failure with hypoxia ([place] [employer])   . IVH (intraventricular hemorrhage) ([place]) 10/29/2017  . Hypoxia 10/28/2017  . Pancreatic lesion 05/24/2017  . 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 Nodule of upper lobe of left lung 06/04/2015  . 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 Cerebral thrombosis with cerebral infarction 04/03/2015  . Cancer of right colon ([place]) 03/28/2015  . CVA (cerebral infarction)  12/21/2014  . Polycythemia vera ([place]) 06/22/2006   April Sea, MS/CCC- SLP  [name] 03/03/2018, 3:52 PM  [place] MAIN [facility] SERVICES [address] [place], [place], [address] Phone: [phone]   Fax:  [phone]   Name: [name] MRN: [id number] Date of Birth: 1932-01-10

## 2018-03-03 NOTE — Telephone Encounter (Signed)
Patient's niece called back and LVM that patient does not have any metal in her body except for a dental implant.

## 2018-03-03 NOTE — Telephone Encounter (Signed)
-----   Message from Lequita Asal, MD sent at 02/27/2018  4:49 PM EDT ----- Regarding: Please confirm patient does not...  Please confirm patient does not have stent or other metal object that would preclude the MRI scheduled in 03/2018.  M

## 2018-03-07 ENCOUNTER — Encounter: Payer: Self-pay | Admitting: Occupational Therapy

## 2018-03-07 ENCOUNTER — Ambulatory Visit: Payer: Medicare Other | Admitting: Occupational Therapy

## 2018-03-07 ENCOUNTER — Encounter: Payer: Self-pay | Admitting: Speech Pathology

## 2018-03-07 ENCOUNTER — Inpatient Hospital Stay: Payer: Medicare Other | Attending: Hematology and Oncology

## 2018-03-07 ENCOUNTER — Ambulatory Visit: Payer: Medicare Other | Admitting: Speech Pathology

## 2018-03-07 DIAGNOSIS — Z79899 Other long term (current) drug therapy: Secondary | ICD-10-CM | POA: Insufficient documentation

## 2018-03-07 DIAGNOSIS — D45 Polycythemia vera: Secondary | ICD-10-CM

## 2018-03-07 DIAGNOSIS — I10 Essential (primary) hypertension: Secondary | ICD-10-CM

## 2018-03-07 DIAGNOSIS — D3A029 Benign carcinoid tumor of the large intestine, unspecified portion: Secondary | ICD-10-CM | POA: Diagnosis not present

## 2018-03-07 DIAGNOSIS — I69123 Fluency disorder following nontraumatic intracerebral hemorrhage: Secondary | ICD-10-CM

## 2018-03-07 DIAGNOSIS — K869 Disease of pancreas, unspecified: Secondary | ICD-10-CM

## 2018-03-07 DIAGNOSIS — M6281 Muscle weakness (generalized): Secondary | ICD-10-CM | POA: Diagnosis not present

## 2018-03-07 DIAGNOSIS — R278 Other lack of coordination: Secondary | ICD-10-CM

## 2018-03-07 DIAGNOSIS — C182 Malignant neoplasm of ascending colon: Secondary | ICD-10-CM

## 2018-03-07 DIAGNOSIS — R41841 Cognitive communication deficit: Secondary | ICD-10-CM

## 2018-03-07 LAB — CBC WITH DIFFERENTIAL/PLATELET
Basophils Absolute: 0 10*3/uL (ref 0–0.1)
Basophils Relative: 0 %
Eosinophils Absolute: 0.2 10*3/uL (ref 0–0.7)
Eosinophils Relative: 2 %
HCT: 42.3 % (ref 35.0–47.0)
Hemoglobin: 13.8 g/dL (ref 12.0–16.0)
Lymphocytes Relative: 11 %
Lymphs Abs: 1 10*3/uL (ref 1.0–3.6)
MCH: 29.8 pg (ref 26.0–34.0)
MCHC: 32.5 g/dL (ref 32.0–36.0)
MCV: 91.6 fL (ref 80.0–100.0)
Monocytes Absolute: 0.8 10*3/uL (ref 0.2–0.9)
Monocytes Relative: 9 %
Neutro Abs: 6.6 10*3/uL — ABNORMAL HIGH (ref 1.4–6.5)
Neutrophils Relative %: 78 %
Platelets: 414 10*3/uL (ref 150–440)
RBC: 4.61 MIL/uL (ref 3.80–5.20)
RDW: 21.3 % — ABNORMAL HIGH (ref 11.5–14.5)
WBC: 8.6 10*3/uL (ref 3.6–11.0)

## 2018-03-07 LAB — COMPREHENSIVE METABOLIC PANEL
ALT: 15 U/L (ref 0–44)
AST: 15 U/L (ref 15–41)
Albumin: 4.3 g/dL (ref 3.5–5.0)
Alkaline Phosphatase: 38 U/L (ref 38–126)
Anion gap: 9 (ref 5–15)
BUN: 30 mg/dL — ABNORMAL HIGH (ref 8–23)
CO2: 28 mmol/L (ref 22–32)
Calcium: 9.6 mg/dL (ref 8.9–10.3)
Chloride: 103 mmol/L (ref 98–111)
Creatinine, Ser: 1.25 mg/dL — ABNORMAL HIGH (ref 0.44–1.00)
GFR calc Af Amer: 44 mL/min — ABNORMAL LOW (ref >60)
GFR calc non Af Amer: 38 mL/min — ABNORMAL LOW (ref >60)
Glucose, Bld: 135 mg/dL — ABNORMAL HIGH (ref 70–99)
Potassium: 4.1 mmol/L (ref 3.5–5.1)
Sodium: 140 mmol/L (ref 135–145)
Total Bilirubin: 0.8 mg/dL (ref 0.3–1.2)
Total Protein: 6.9 g/dL (ref 6.5–8.1)

## 2018-03-07 NOTE — Therapy (Signed)
[place] MAIN [facility] SERVICES [address] [place], [place], [address] Phone: [phone]   Fax:  [phone]  Speech Language Pathology Treatment  Patient Details  Name: [name] MRN: [id number] Date of Birth: 06/12/1932 Referring Provider: Charlett Blake    Encounter Date: 03/07/2018  End of Session - 03/07/18 1525    Visit Number  11    Number of Visits  17    Date for SLP Re-Evaluation  03/21/18    SLP Start Time  44    SLP Stop Time   1450    SLP Time Calculation (min)  50 min    Activity Tolerance  Patient tolerated treatment well       Past Medical History:  Diagnosis Date  . Adenocarcinoma in situ of cervix   . Arthritis    hands  . Breast cancer ([place])   . Colon cancer ([place])   . Diabetes mellitus without complication ([place])   . Endometrial carcinoma (HCC)    s/p total abdominal hysterectomy  . H/O compression fracture of spine 2014   thoracic spine  . H/O polycythemia vera   . H/O TIA (transient ischemic attack) and stroke 09/2014, 03/2015   No deficits  . Hypertension   . Hyperuricemia   . Microalbuminuria   . Polycythemia vera ([place])   . Recurrent falls   . Skin cancer    face, legs  . Stroke [facility]) 2008   no deficits  . Trochanteric bursitis   . Varicose veins    treated    Past Surgical History:  Procedure Laterality Date  . ABDOMINAL HYSTERECTOMY    . BOWEL RESECTION N/A 03/28/2015   Procedure: SMALL BOWEL RESECTION;  Surgeon: Leonie Green, MD;  Location: [facility] ORS;  Service: General;  Laterality: N/A;  . CATARACT EXTRACTION W/ INTRAOCULAR LENS IMPLANT Right   . CATARACT EXTRACTION W/PHACO Left 10/07/2015   Procedure: CATARACT EXTRACTION PHACO AND INTRAOCULAR LENS PLACEMENT (IOC);  Surgeon: Ronnell Freshwater, MD;  Location: [place];  Service: Ophthalmology;  Laterality: Left;  DIABETIC - oral meds VISION BLUE  . CORONARY ANGIOGRAPHY N/A 10/29/2017   Procedure: CORONARY  ANGIOGRAPHY;  Surgeon: Dionisio David, MD;  Location: [place] CV LAB;  Service: Cardiovascular;  Laterality: N/A;  . EXPLORATORY LAPAROTOMY     for fibroids  . LEFT HEART CATH Right 10/29/2017   Procedure: Left Heart Cath;  Surgeon: Dionisio David, MD;  Location: [place] CV LAB;  Service: Cardiovascular;  Laterality: Right;  . TONSILLECTOMY      There were no vitals filed for this visit.  Subjective Assessment - 03/07/18 1524    Subjective  "I'm so tired after PT"            ADULT SLP TREATMENT - 03/07/18 0001      General Information   Behavior/Cognition  Alert;Cooperative;Pleasant mood    HPI   84 year old female with adenocarcinoma of the cervix as well as endometrial carcinoma and polycythemia vera as well as diabetes who had hypertensive right cerebellar hemorrhage onset 10/28/2017.  She was treated initially at [facility] and then transferred to [facility].  Neurosurgical evaluation per Dr. Ronnald Ramp concluded no surgery was needed.  The patient completed inpatient rehabilitation at [facility] (including SLP) 11/04/2017-11/18/2107 and was discharged at a 24-hour supervision level.  The patient has hired caregiver to provide 24-hour supervision. Patient has received home health rehab services, including SLP.  Treatment Provided   Treatment provided  Cognitive-Linquistic      Pain Assessment   Pain Assessment  No/denies pain      Cognitive-Linquistic Treatment   Treatment focused on  Apraxia;Aphasia    Skilled Treatment  HANDWRITING: the patient is concerned regarding her handwriting.  Legibility improves with lined paper to encourage larger letters.  Patient returned with homework showing much improved legibility. Patient able to complete worksheet in session with 100% legibility.  FLUENCY: spontaneous speech is notable for significant episodes of dysfluency.  The patient is 100% fluent when reading words and simple sentences.  Patient is 85%  fluency in simple cognitive linguistic task and 75% fluent in more abstract cognitive linguistic task.  The patient is able to state that slowing her speech would be helpful, but she "always talked fast".  WORD FINDING: Patient is able to complete moderately complex word finding worksheets (name abstract category given 3 members, state item given definition, state multiple meanings for homonyms) with 80% accuracy.      Assessment / Recommendations / Plan   Plan  Continue with current plan of care      Progression Toward Goals   Progression toward goals  Progressing toward goals       SLP Education - 03/07/18 1524    Education Details  slow speech to improve fluency    Person(s) Educated  Patient    Methods  Explanation    Comprehension  Verbalized understanding         SLP Long Term Goals - 02/22/18 1454      SLP LONG TERM GOAL #1   Title  Patient will generate fluent, grammatical, and cogent sentence to complete simple/concrete linguistic task with 80% accuracy.    Status  Achieved      SLP LONG TERM GOAL #2   Title  Patient will generate grammatical, fluent, and cogent sentences to complete abstract/complex linguistic task with 80% accuracy.    Status  Partially Met    Target Date  03/21/18      SLP LONG TERM GOAL #3   Title  Patient will identify cognitive-communication barriers and participate in developing functional compensatory strategies.    Status  Deferred      SLP LONG TERM GOAL #4   Title  Patient will complete moderately complex executive function skills tasks with 80% accuracy.    Status  Deferred       Plan - 03/07/18 1525    Clinical Impression Statement  The patient is most fluent under very controlled setting, such as reading aloud.  Her fluency decreases as the cognitive load of the task increases.  She is generating more legible handwriting.  Will benefit continue ST to address self-expression, fluency, and word finding.    Speech Therapy Frequency  2x /  week    Duration  Other (comment)    Treatment/Interventions  Compensatory strategies;Patient/family education;Cognitive reorganization;Language facilitation;Internal/external aids    Potential to Achieve Goals  Good    Potential Considerations  Ability to learn/carryover information;Pain level;Family/community support;Co-morbidities;Previous level of function;Cooperation/participation level;Severity of impairments;Medical prognosis    SLP Home Exercise Plan  writing, word finding work Financial trader and Agree with Plan of Care  Patient       Patient will benefit from skilled therapeutic intervention in order to improve the following deficits and impairments:   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 Cognitive deficit, post-stroke 12/31/2017  . Ataxia, post-stroke 12/31/2017  . Dysarthria, post-stroke   . Gait disturbance, post-stroke   . H/O cerebral parenchymal hemorrhage 11/04/2017  . Essential hypertension 11/04/2017  . Hyperlipidemia 11/04/2017  . Diabetes ([place]) 11/04/2017  . CAD (coronary artery disease) 11/04/2017  . 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 Hypokalemia 11/04/2017  . Right-sided nontraumatic intracerebral hemorrhage of cerebellum ([place])   . History of cervical cancer   . History of TIA (transient ischemic attack)   . Acute systolic congestive heart failure ([place])   . Reactive hypertension   . Hypernatremia   . Leukocytosis   . Acute blood loss anemia   . Elevated serum creatinine   . Acute respiratory failure with hypoxia ([place])   . IVH (intraventricular hemorrhage) ([place]) 10/29/2017  . Hypoxia 10/28/2017  . Pancreatic lesion 05/24/2017  . 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 Nodule of upper lobe of left lung 06/04/2015  . 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 Cerebral thrombosis with  cerebral infarction 04/03/2015  . Cancer of right colon ([place]) 03/28/2015  . CVA (cerebral infarction) 12/21/2014  . Polycythemia vera ([place]) 06/22/2006   Leroy Sea, MS/CCC- SLP  Lou Miner 03/07/2018, 3:26 PM  [place] MAIN [facility] SERVICES [address] [place], [place], [address] Phone: [phone]   Fax:  [phone]   Name: JERMIYA REICHL MRN: [id number] Date of Birth: 24-Aug-1931

## 2018-03-07 NOTE — Therapy (Signed)
[place] MAIN [facility] SERVICES [address] [place], [place], [address] Phone: [phone]   Fax:  [phone]  Occupational Therapy Treatment  Patient Details  Name: [name] MRN: [id number] Date of Birth: 13-Feb-1932 Referring Provider: Charlett Blake   Encounter Date: 03/07/2018  OT End of Session - 03/07/18 1530    Visit Number  7    Number of Visits  24    Date for OT Re-Evaluation  04/14/18    Authorization Time Period  visit 7/10, reporting period starting 01/20/2018    OT Start Time  1515    OT Stop Time  1600    OT Time Calculation (min)  45 min    Activity Tolerance  Patient tolerated treatment well    Behavior During Therapy  [facility] for tasks assessed/performed       Past Medical History:  Diagnosis Date  . Adenocarcinoma in situ of cervix   . Arthritis    hands  . Breast cancer ([place])   . Colon cancer ([place])   . Diabetes mellitus without complication ([place])   . Endometrial carcinoma (HCC)    s/p total abdominal hysterectomy  . H/O compression fracture of spine 2014   thoracic spine  . H/O polycythemia vera   . H/O TIA (transient ischemic attack) and stroke 09/2014, 03/2015   No deficits  . Hypertension   . Hyperuricemia   . Microalbuminuria   . Polycythemia vera ([place])   . Recurrent falls   . Skin cancer    face, legs  . Stroke [facility]) 2008   no deficits  . Trochanteric bursitis   . Varicose veins    treated    Past Surgical History:  Procedure Laterality Date  . ABDOMINAL HYSTERECTOMY    . BOWEL RESECTION N/A 03/28/2015   Procedure: SMALL BOWEL RESECTION;  Surgeon: Leonie Green, MD;  Location: [facility] ORS;  Service: General;  Laterality: N/A;  . CATARACT EXTRACTION W/ INTRAOCULAR LENS IMPLANT Right   . CATARACT EXTRACTION W/PHACO Left 10/07/2015   Procedure: CATARACT EXTRACTION PHACO AND INTRAOCULAR LENS PLACEMENT (IOC);  Surgeon: Ronnell Freshwater, MD;  Location: [place];  Service:  Ophthalmology;  Laterality: Left;  DIABETIC - oral meds VISION BLUE  . CORONARY ANGIOGRAPHY N/A 10/29/2017   Procedure: CORONARY ANGIOGRAPHY;  Surgeon: Dionisio David, MD;  Location: [place] CV LAB;  Service: Cardiovascular;  Laterality: N/A;  . EXPLORATORY LAPAROTOMY     for fibroids  . LEFT HEART CATH Right 10/29/2017   Procedure: Left Heart Cath;  Surgeon: Dionisio David, MD;  Location: [place] CV LAB;  Service: Cardiovascular;  Laterality: Right;  . TONSILLECTOMY      There were no vitals filed for this visit.  Subjective Assessment - 03/07/18 1524    Subjective   Pt. reports that she is doing well today.    Pertinent History  84 year old female with adenocarcinoma of the cervix as well as endometrial carcinoma and polycythemia vera as well as diabetes who had hypertensive right cerebellar hemorrhage onset 10/28/2017.  She was treated initially at [facility] and then transferred to [facility].  Neurosurgical evaluation per Dr. Ronnald Ramp concluded no surgery was needed.  The patient completed inpatient rehabilitation at [facility] (including SLP) 11/04/2017-11/18/2107 and was discharged at a 24-hour supervision level.  The patient has hired caregiver to provide 24-hour supervision. Patient has received home health rehab services, including SLP.     Patient Stated Goals  Patient  would like t be independent in all tasks and be able to live alone again.    Currently in Pain?  No/denies      OT TREATMENT    Neuro muscular re-education:  Pt. worked on Forensic psychologist with the right hand to grasp 1/8" pegs, and place them on an extra small pegboard.  Pt. worked on removing the pegs while grasping with her 2nd digit, and thumb.  Selfcare:  Pt. continues to work on writing checks with 75% legibility for the first check, and 50% legibility with the second as pt. fatigued. Pt. worked on check writing tasks while requiring increased time, and cues to  complete. Pt. Was able to hold the pen with a pen grip.                         OT Education - 03/07/18 1530    Education Details  handwriting, fine motor coordination    Person(s) Educated  Patient    Methods  Explanation;Demonstration;Verbal cues    Comprehension  Verbalized understanding;Returned demonstration;Verbal cues required          OT Long Term Goals - 01/22/18 1121      OT LONG TERM GOAL #1   Title  Patient will complete bathing with modified independence    Baseline  moderate assist    Time  12    Period  Weeks    Status  New      OT LONG TERM GOAL #2   Title  Patient will complete dressing skills with modified independence    Baseline  moderate assist    Time  12    Period  Weeks    Status  New      OT LONG TERM GOAL #3   Title  Patient will complete light meal prep with min assist    Baseline  max assist    Time  12    Period  Weeks    Status  New      OT LONG TERM GOAL #4   Title  Patient will increase R grip strength by 5# to open jars and containers with modified independence    Baseline  difficulty at eval    Time  12    Period  Weeks    Status  New      OT LONG TERM GOAL #5   Title  Patient will improve strength by 1 mm grade RUE to assist with obtaining items from the closet.     Baseline  4/5 overall RUE    Time  8    Period  Weeks    Status  New            Plan - 03/07/18 1530    Clinical Impression Statement  Pt. continues to have in home caregivers 24 hours a day, 7 days a week. To assist with all aspects morning, and, and self-care tasks. Pt. continues to work on improving right hand [facility] skills, and prehension patterns for the manipulation of small objects during ADL tasks, IADL tasks, and writing. Pt. continues to work on improving writing legibility, and efficiency for check writing tasks.     Occupational Profile and client history currently impacting functional performance  family more than 30 mins away,  has 24 hour caregiver assist, decreased awareness, memory and safety    Occupational performance deficits (Please refer to evaluation for details):  ADL's;IADL's;Social Participation    Rehab Potential  Good    Current Impairments/barriers affecting progress:  memory, requires 24 hour caregivers,     OT Frequency  2x / week    OT Duration  12 weeks    OT Treatment/Interventions  Self-care/ADL training;Cryotherapy;Therapeutic exercise;DME and/or AE instruction;Functional Mobility Training;Cognitive remediation/compensation;Balance training;Neuromuscular education;Manual Therapy;Moist Heat;Therapeutic activities;Patient/family education    Clinical Decision Making  Several treatment options, min-mod task modification necessary    Consulted and Agree with Plan of Care  Patient    Family Member Consulted  caregiver       Patient will benefit from skilled therapeutic intervention in order to improve the following deficits and impairments:  Decreased balance, Decreased mobility, Difficulty walking, Decreased cognition, Decreased activity tolerance, Decreased coordination, Decreased safety awareness, Decreased strength, Impaired UE functional use  Visit Diagnosis: Other lack of coordination    Problem List Patient Active Problem List   Diagnosis Date Noted  . Cognitive deficit, post-stroke 12/31/2017  . Ataxia, post-stroke 12/31/2017  . Dysarthria, post-stroke   . Gait disturbance, post-stroke   . H/O cerebral parenchymal hemorrhage 11/04/2017  . Essential hypertension 11/04/2017  . Hyperlipidemia 11/04/2017  . Diabetes ([place]) 11/04/2017  . CAD (coronary artery disease) 11/04/2017  . 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 Hypokalemia 11/04/2017  . Right-sided nontraumatic intracerebral hemorrhage of cerebellum ([place])   . History of cervical cancer   . History of TIA (transient ischemic attack)   . Acute systolic congestive heart failure ([place])   . Reactive hypertension   .  Hypernatremia   . Leukocytosis   . Acute blood loss anemia   . Elevated serum creatinine   . Acute respiratory failure with hypoxia ([place])   . IVH (intraventricular hemorrhage) ([place]) 10/29/2017  . Hypoxia 10/28/2017  . Pancreatic lesion 05/24/2017  . 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 Nodule of upper lobe of left lung 06/04/2015  . 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 Cerebral thrombosis with cerebral infarction 04/03/2015  . Cancer of right colon ([place]) 03/28/2015  . CVA (cerebral infarction) 12/21/2014  . Polycythemia vera ([place]) 06/22/2006    Harrel Carina, MS, OTR/L 03/07/2018, 3:47 PM  [place] MAIN [facility] SERVICES [address] [place], [place], [address] Phone: [phone]   Fax:  [phone]  Name: [name] MRN: [id number] Date of Birth: 1931-09-13

## 2018-03-08 LAB — HEMOGLOBIN A1C
Hgb A1c MFr Bld: 6.6 % — ABNORMAL HIGH (ref 4.8–5.6)
Mean Plasma Glucose: 143 mg/dL

## 2018-03-09 ENCOUNTER — Ambulatory Visit: Payer: Medicare Other | Admitting: Physical Therapy

## 2018-03-09 ENCOUNTER — Ambulatory Visit: Payer: Medicare Other | Admitting: Speech Pathology

## 2018-03-11 ENCOUNTER — Encounter: Payer: Self-pay | Admitting: Hematology and Oncology

## 2018-03-14 ENCOUNTER — Ambulatory Visit: Payer: Medicare Other | Admitting: Speech Pathology

## 2018-03-14 ENCOUNTER — Ambulatory Visit: Payer: Medicare Other | Admitting: Occupational Therapy

## 2018-03-14 ENCOUNTER — Encounter: Payer: Self-pay | Admitting: Occupational Therapy

## 2018-03-14 DIAGNOSIS — R41841 Cognitive communication deficit: Secondary | ICD-10-CM

## 2018-03-14 DIAGNOSIS — I69123 Fluency disorder following nontraumatic intracerebral hemorrhage: Secondary | ICD-10-CM

## 2018-03-14 DIAGNOSIS — M6281 Muscle weakness (generalized): Secondary | ICD-10-CM

## 2018-03-14 DIAGNOSIS — R278 Other lack of coordination: Secondary | ICD-10-CM

## 2018-03-14 NOTE — Therapy (Addendum)
[place] MAIN [facility] SERVICES [address] [place], [place], [address] Phone: [phone]   Fax:  [phone]  Occupational Therapy Progress Note  Dates of reporting period  01/20/2018  to   03/14/2018  Patient Details  Name: [name] MRN: [id number] Date of Birth: 02/13/1932 Referring Provider: Charlett Blake   Encounter Date: 03/14/2018  OT End of Session - 03/14/18 1328    Visit Number  8    Number of Visits  24    Date for OT Re-Evaluation  04/14/18    Authorization Type  Medicare    Authorization Time Period  visit 8/10, reporting period starting 01/20/2018    OT Start Time  1300    OT Stop Time  1345    OT Time Calculation (min)  45 min    Activity Tolerance  Patient tolerated treatment well    Behavior During Therapy  [facility] for tasks assessed/performed       Past Medical History:  Diagnosis Date  . Adenocarcinoma in situ of cervix   . Arthritis    hands  . Breast cancer ([place])   . Colon cancer ([place])   . Diabetes mellitus without complication ([place])   . Endometrial carcinoma (HCC)    s/p total abdominal hysterectomy  . H/O compression fracture of spine 2014   thoracic spine  . H/O polycythemia vera   . H/O TIA (transient ischemic attack) and stroke 09/2014, 03/2015   No deficits  . Hypertension   . Hyperuricemia   . Microalbuminuria   . Polycythemia vera ([place])   . Recurrent falls   . Skin cancer    face, legs  . Stroke [facility]) 2008   no deficits  . Trochanteric bursitis   . Varicose veins    treated    Past Surgical History:  Procedure Laterality Date  . ABDOMINAL HYSTERECTOMY    . BOWEL RESECTION N/A 03/28/2015   Procedure: SMALL BOWEL RESECTION;  Surgeon: Leonie Green, MD;  Location: [facility] ORS;  Service: General;  Laterality: N/A;  . CATARACT EXTRACTION W/ INTRAOCULAR LENS IMPLANT Right   . CATARACT EXTRACTION W/PHACO Left 10/07/2015   Procedure: CATARACT EXTRACTION PHACO AND INTRAOCULAR LENS  PLACEMENT (IOC);  Surgeon: Ronnell Freshwater, MD;  Location: [place];  Service: Ophthalmology;  Laterality: Left;  DIABETIC - oral meds VISION BLUE  . CORONARY ANGIOGRAPHY N/A 10/29/2017   Procedure: CORONARY ANGIOGRAPHY;  Surgeon: Dionisio David, MD;  Location: [place] CV LAB;  Service: Cardiovascular;  Laterality: N/A;  . EXPLORATORY LAPAROTOMY     for fibroids  . LEFT HEART CATH Right 10/29/2017   Procedure: Left Heart Cath;  Surgeon: Dionisio David, MD;  Location: [place] CV LAB;  Service: Cardiovascular;  Laterality: Right;  . TONSILLECTOMY      There were no vitals filed for this visit.  Subjective Assessment - 03/14/18 1322    Subjective   Pt reports having a good day and enjoying getting her hair done this weekend.    Pertinent History  84 year old female with adenocarcinoma of the cervix as well as endometrial carcinoma and polycythemia vera as well as diabetes who had hypertensive right cerebellar hemorrhage onset 10/28/2017.  She was treated initially at [facility] and then transferred to [facility].  Neurosurgical evaluation per Dr. Ronnald Ramp concluded no surgery was needed.  The patient completed inpatient rehabilitation at [facility] (including SLP) 11/04/2017-11/18/2107 and was discharged at a 24-hour supervision level.  The patient has hired caregiver to provide 24-hour supervision. Patient has received home health rehab services, including SLP.     Patient Stated Goals  Patient would like to be independent in all tasks and be able to live alone again.    Currently in Pain?  No/denies    Pain Score  0-No pain         [facility] OT Assessment - 03/14/18 1305      Coordination   Right 9 Hole Peg Test  54    Left 9 Hole Peg Test  36      Hand Function   Right Hand Grip (lbs)  24    Right Hand Lateral Pinch  12 lbs    Right Hand 3 Point Pinch  8 lbs    Left Hand Grip (lbs)  23    Left Hand Lateral Pinch  9 lbs    Left 3 point  pinch  9 lbs      OT TREATMENT  Measures were obtained for grip strength, pinch strength, writing speed and endurance, and coordination  Neuromuscular re-education:  Pt completed [place] activity that required her to remove and replace 1" cubes against velcro resistance with R hand. Pt alternated between digits 2 and 3 to secure blocks once replaced. Pt noted hand and digits feeling fatigued as the activity progressed. Pt consistently used tripod grasp or pincer grasp to remove blocks.  Pt completed [facility] activity that required her to manipulate small pegs and place them in a row on small peg board. Pt required increased time to manipulate small pegs and demonstrated decreased precision when placing them into the board. Pt alternated opposing digits 2-5 to thumb to remove pegs from peg board.                 OT Education - 03/14/18 1323    Education Details  handwriting, fine motor coordination, grip strength    Person(s) Educated  Patient    Methods  Explanation;Demonstration;Verbal cues    Comprehension  Verbalized understanding;Returned demonstration;Verbal cues required          OT Long Term Goals - 03/14/18 1358      OT LONG TERM GOAL #1   Title  Patient will complete bathing with modified independence    Baseline  moderate assist    Time  12    Period  Weeks    Status  On-going    Target Date  04/14/18      OT LONG TERM GOAL #2   Title  Patient will complete dressing skills with modified independence    Baseline  moderate assist    Time  12    Period  Weeks    Status  On-going    Target Date  04/14/18      OT LONG TERM GOAL #3   Title  Patient will complete light meal prep with min assist    Baseline  max assist    Time  12    Period  Weeks    Status  On-going    Target Date  04/14/18      OT LONG TERM GOAL #4   Title  Patient will increase R grip strength by 5# to open jars and containers with modified independence    Baseline  03/14/2018 R grip  strength 24#    Time  12    Period  Weeks    Status  On-going    Target Date  04/14/18  OT LONG TERM GOAL #5   Title  Patient will improve strength by 1 mm grade RUE to assist with obtaining items from the closet.     Baseline  4/5 overall RUE    Time  8    Period  Weeks    Status  On-going    Target Date  04/14/18      Long Term Additional Goals   Additional Long Term Goals  Yes      OT LONG TERM GOAL #6   Title  Pt will increase handwriting legibility and speed to 50% while writing a 3 sentence paragraph in order to write thank you notes and birthday cards for friends and family    Baseline  03/14/2018 - 25% legibility for 3 sentences 10 mins and 13 secs    Time  4    Period  Weeks    Status  New    Target Date  04/14/18            Plan - 03/14/18 1325    Clinical Impression Statement Pt continues to works to improve R hand [facility] skills, and grip strength for use of small objects and handwriting during ADLs and IADLs. Pt continues to work on Counselling psychologist and writing endurance in print and cursive forms. A new goal was added to the treatment POC to improve writing speed, and legibility.    Occupational Profile and client history currently impacting functional performance  family more than 30 mins away, has 24 hour caregiver assist, decreased awareness, memory and safety    Occupational performance deficits (Please refer to evaluation for details):  ADL's;IADL's;Social Participation    Rehab Potential  Good    Current Impairments/barriers affecting progress:  memory, requires 24 hour caregivers,     OT Frequency  2x / week    OT Duration  12 weeks    OT Treatment/Interventions  Self-care/ADL training;Cryotherapy;Therapeutic exercise;DME and/or AE instruction;Functional Mobility Training;Cognitive remediation/compensation;Balance training;Neuromuscular education;Manual Therapy;Moist Heat;Therapeutic activities;Patient/family education    Clinical Decision Making   Several treatment options, min-mod task modification necessary    Consulted and Agree with Plan of Care  Patient    Family Member Consulted  caregiver       Patient will benefit from skilled therapeutic intervention in order to improve the following deficits and impairments:  Decreased balance, Decreased mobility, Difficulty walking, Decreased cognition, Decreased activity tolerance, Decreased coordination, Decreased safety awareness, Decreased strength, Impaired UE functional use  Visit Diagnosis: Muscle weakness (generalized)  Other lack of coordination    Problem List Patient Active Problem List   Diagnosis Date Noted  . Cognitive deficit, post-stroke 12/31/2017  . Ataxia, post-stroke 12/31/2017  . Dysarthria, post-stroke   . Gait disturbance, post-stroke   . H/O cerebral parenchymal hemorrhage 11/04/2017  . Essential hypertension 11/04/2017  . Hyperlipidemia 11/04/2017  . Diabetes ([place]) 11/04/2017  . CAD (coronary artery disease) 11/04/2017  . 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 Hypokalemia 11/04/2017  . Right-sided nontraumatic intracerebral hemorrhage of cerebellum ([place])   . History of cervical cancer   . History of TIA (transient ischemic attack)   . Acute systolic congestive heart failure ([place])   . Reactive hypertension   . Hypernatremia   . Leukocytosis   . Acute blood loss anemia   . Elevated serum creatinine   . Acute respiratory failure with hypoxia ([place])   . IVH (intraventricular hemorrhage) ([place]) 10/29/2017  . Hypoxia 10/28/2017  . Pancreatic lesion 05/24/2017  . 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  Nodule of upper lobe of left lung 06/04/2015  . 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 Cerebral thrombosis with cerebral infarction 04/03/2015  . Cancer of right colon ([place]) 03/28/2015  . CVA (cerebral infarction) 12/21/2014  . Polycythemia vera ([place]) 06/22/2006    Oliver Hum, OTS 03/14/2018, 2:07 PM   This entire  session was performed under direct supervision and direction of a licensed therapist/therapist assistant . I have personally read, edited and approve of the note as written.  Harrel Carina, MS, OTR/L   [place] MAIN [facility] SERVICES [address] [place], [place], [address] Phone: [phone]   Fax:  [phone]  Name: ZARAI ORSBORN MRN: [id number] Date of Birth: 1931/10/04

## 2018-03-15 ENCOUNTER — Encounter: Payer: Self-pay | Admitting: Speech Pathology

## 2018-03-15 NOTE — Therapy (Signed)
[place] MAIN [facility] SERVICES [address] [place], [place], [address] Phone: [phone]   Fax:  [phone]  Speech Language Pathology Treatment  Patient Details  Name: [name] MRN: [id number] Date of Birth: 12/14/1931 Referring Provider: Charlett Blake    Encounter Date: 03/14/2018  End of Session - 03/15/18 0845    Visit Number  12    Number of Visits  17    Date for SLP Re-Evaluation  03/21/18    SLP Start Time  1400    SLP Stop Time   1450    SLP Time Calculation (min)  50 min    Activity Tolerance  Patient tolerated treatment well       Past Medical History:  Diagnosis Date  . Adenocarcinoma in situ of cervix   . Arthritis    hands  . Breast cancer ([place])   . Colon cancer ([place])   . Diabetes mellitus without complication ([place])   . Endometrial carcinoma (HCC)    s/p total abdominal hysterectomy  . H/O compression fracture of spine 2014   thoracic spine  . H/O polycythemia vera   . H/O TIA (transient ischemic attack) and stroke 09/2014, 03/2015   No deficits  . Hypertension   . Hyperuricemia   . Microalbuminuria   . Polycythemia vera ([place])   . Recurrent falls   . Skin cancer    face, legs  . Stroke [facility]) 2008   no deficits  . Trochanteric bursitis   . Varicose veins    treated    Past Surgical History:  Procedure Laterality Date  . ABDOMINAL HYSTERECTOMY    . BOWEL RESECTION N/A 03/28/2015   Procedure: SMALL BOWEL RESECTION;  Surgeon: Leonie Green, MD;  Location: [facility] ORS;  Service: General;  Laterality: N/A;  . CATARACT EXTRACTION W/ INTRAOCULAR LENS IMPLANT Right   . CATARACT EXTRACTION W/PHACO Left 10/07/2015   Procedure: CATARACT EXTRACTION PHACO AND INTRAOCULAR LENS PLACEMENT (IOC);  Surgeon: Ronnell Freshwater, MD;  Location: [place];  Service: Ophthalmology;  Laterality: Left;  DIABETIC - oral meds VISION BLUE  . CORONARY ANGIOGRAPHY N/A 10/29/2017   Procedure: CORONARY  ANGIOGRAPHY;  Surgeon: Dionisio David, MD;  Location: [facility] CV LAB;  Service: Cardiovascular;  Laterality: N/A;  . EXPLORATORY LAPAROTOMY     for fibroids  . LEFT HEART CATH Right 10/29/2017   Procedure: Left Heart Cath;  Surgeon: Dionisio David, MD;  Location: [place] CV LAB;  Service: Cardiovascular;  Laterality: Right;  . TONSILLECTOMY      There were no vitals filed for this visit.  Subjective Assessment - 03/15/18 0844    Subjective  "I'm so tired after OT"            ADULT SLP TREATMENT - 03/15/18 0001      General Information   Behavior/Cognition  Alert;Cooperative;Pleasant mood    HPI   84 year old female with adenocarcinoma of the cervix as well as endometrial carcinoma and polycythemia vera as well as diabetes who had hypertensive right cerebellar hemorrhage onset 10/28/2017.  She was treated initially at [facility] and then transferred to [facility].  Neurosurgical evaluation per Dr. Ronnald Ramp concluded no surgery was needed.  The patient completed inpatient rehabilitation at [facility] (including SLP) 11/04/2017-11/18/2107 and was discharged at a 24-hour supervision level.  The patient has hired caregiver to provide 24-hour supervision. Patient has received home health rehab services, including SLP.  Treatment Provided   Treatment provided  Cognitive-Linquistic      Pain Assessment   Pain Assessment  No/denies pain      Cognitive-Linquistic Treatment   Treatment focused on  Apraxia;Aphasia    Skilled Treatment  HANDWRITING: the patient is concerned regarding her handwriting.  Legibility improves with lined paper to encourage larger letters.  Patient returned with homework showing much improved legibility. Patient able to complete worksheet in session with 100% legibility.  FLUENCY: spontaneous speech is notable for significant episodes of dysfluency.  The patient is 100% fluent when reading words and simple sentences.  Patient is 85%  fluency in simple cognitive linguistic task and 75% fluent in more abstract cognitive linguistic task.  The patient is able to state that slowing her speech would be helpful, but she "always talked fast".  WORD FINDING: Patient is able to complete moderately complex word finding worksheets (name abstract category given 3 members, deduce item given 3 clue words, identify constant characteristics) with 80% accuracy.      Assessment / Recommendations / Plan   Plan  Continue with current plan of care      Progression Toward Goals   Progression toward goals  Progressing toward goals       SLP Education - 03/15/18 0844    Education Details  slow speech to improve fluency    Person(s) Educated  Patient    Methods  Explanation    Comprehension  Verbalized understanding         SLP Long Term Goals - 02/22/18 1454      SLP LONG TERM GOAL #1   Title  Patient will generate fluent, grammatical, and cogent sentence to complete simple/concrete linguistic task with 80% accuracy.    Status  Achieved      SLP LONG TERM GOAL #2   Title  Patient will generate grammatical, fluent, and cogent sentences to complete abstract/complex linguistic task with 80% accuracy.    Status  Partially Met    Target Date  03/21/18      SLP LONG TERM GOAL #3   Title  Patient will identify cognitive-communication barriers and participate in developing functional compensatory strategies.    Status  Deferred      SLP LONG TERM GOAL #4   Title  Patient will complete moderately complex executive function skills tasks with 80% accuracy.    Status  Deferred       Plan - 03/15/18 0847    Clinical Impression Statement  The patient is most fluent under very controlled setting, such as reading aloud.  Her fluency decreases as the cognitive load of the task increases.  She is generating more legible handwriting.  Will benefit continue ST to address self-expression, fluency, and word finding.    Speech Therapy Frequency  2x /  week    Duration  Other (comment)    Treatment/Interventions  Compensatory strategies;Patient/family education;Cognitive reorganization;Language facilitation;Internal/external aids    Potential to Achieve Goals  Good    Potential Considerations  Ability to learn/carryover information;Pain level;Family/community support;Co-morbidities;Previous level of function;Cooperation/participation level;Severity of impairments;Medical prognosis    SLP Home Exercise Plan  writing, word finding work Financial trader and Agree with Plan of Care  Patient       Patient will benefit from skilled therapeutic intervention in order to improve the following deficits and impairments:   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 Cognitive deficit, post-stroke 12/31/2017  . Ataxia, post-stroke 12/31/2017  . Dysarthria, post-stroke   . Gait disturbance, post-stroke   . H/O cerebral parenchymal hemorrhage 11/04/2017  . Essential hypertension 11/04/2017  . Hyperlipidemia 11/04/2017  . Diabetes ([place]) 11/04/2017  . CAD (coronary artery disease) 11/04/2017  . 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 Hypokalemia 11/04/2017  . Right-sided nontraumatic intracerebral hemorrhage of cerebellum ([place])   . History of cervical cancer   . History of TIA (transient ischemic attack)   . Acute systolic congestive heart failure ([place])   . Reactive hypertension   . Hypernatremia   . Leukocytosis   . Acute blood loss anemia   . Elevated serum creatinine   . Acute respiratory failure with hypoxia ([place])   . IVH (intraventricular hemorrhage) ([place]) 10/29/2017  . Hypoxia 10/28/2017  . Pancreatic lesion 05/24/2017  . 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 Nodule of upper lobe of left lung 06/04/2015  . 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 Cerebral thrombosis with  cerebral infarction 04/03/2015  . Cancer of right colon ([place]) 03/28/2015  . CVA (cerebral infarction) 12/21/2014  . Polycythemia vera ([place]) 06/22/2006   Leroy Sea, MS/CCC- SLP  Lou Miner 03/15/2018, 8:48 AM  [place] MAIN [facility] SERVICES [address] [place], [place], [address] Phone: [phone]   Fax:  [phone]   Name: [name] MRN: [id number] Date of Birth: 1932/04/06

## 2018-03-16 ENCOUNTER — Ambulatory Visit: Payer: Medicare Other | Admitting: Physical Therapy

## 2018-03-16 ENCOUNTER — Ambulatory Visit: Payer: Medicare Other | Admitting: Speech Pathology

## 2018-03-21 ENCOUNTER — Ambulatory Visit: Payer: Medicare Other | Admitting: Speech Pathology

## 2018-03-21 ENCOUNTER — Encounter: Payer: Self-pay | Admitting: Occupational Therapy

## 2018-03-21 ENCOUNTER — Encounter: Payer: Self-pay | Admitting: Speech Pathology

## 2018-03-21 ENCOUNTER — Ambulatory Visit: Payer: Medicare Other | Admitting: Occupational Therapy

## 2018-03-21 DIAGNOSIS — R278 Other lack of coordination: Secondary | ICD-10-CM

## 2018-03-21 DIAGNOSIS — I69123 Fluency disorder following nontraumatic intracerebral hemorrhage: Secondary | ICD-10-CM

## 2018-03-21 DIAGNOSIS — R41841 Cognitive communication deficit: Secondary | ICD-10-CM

## 2018-03-21 DIAGNOSIS — M6281 Muscle weakness (generalized): Secondary | ICD-10-CM

## 2018-03-21 NOTE — Therapy (Addendum)
[place] MAIN [facility] SERVICES [address] [place], [place], [address] Phone: [phone]   Fax:  [phone]  Occupational Therapy Treatment  Patient Details  Name: [name] MRN: [id number] Date of Birth: Jan 27, 1932 No data recorded  Encounter Date: 03/21/2018  OT End of Session - 03/21/18 1604    Visit Number  9    Number of Visits  24    Date for OT Re-Evaluation  04/14/18    Authorization Type  Medicare    Authorization Time Period  visit 9/10, reporting period starting 01/20/2018    OT Start Time  1515    OT Stop Time  1600    OT Time Calculation (min)  45 min    Activity Tolerance  Patient tolerated treatment well    Behavior During Therapy  [facility] for tasks assessed/performed       Past Medical History:  Diagnosis Date  . Adenocarcinoma in situ of cervix   . Arthritis    hands  . Breast cancer ([place])   . Colon cancer ([place])   . Diabetes mellitus without complication ([place])   . Endometrial carcinoma (HCC)    s/p total abdominal hysterectomy  . H/O compression fracture of spine 2014   thoracic spine  . H/O polycythemia vera   . H/O TIA (transient ischemic attack) and stroke 09/2014, 03/2015   No deficits  . Hypertension   . Hyperuricemia   . Microalbuminuria   . Polycythemia vera ([place])   . Recurrent falls   . Skin cancer    face, legs  . Stroke [facility]) 2008   no deficits  . Trochanteric bursitis   . Varicose veins    treated    Past Surgical History:  Procedure Laterality Date  . ABDOMINAL HYSTERECTOMY    . BOWEL RESECTION N/A 03/28/2015   Procedure: SMALL BOWEL RESECTION;  Surgeon: Leonie Green, MD;  Location: [facility] ORS;  Service: General;  Laterality: N/A;  . CATARACT EXTRACTION W/ INTRAOCULAR LENS IMPLANT Right   . CATARACT EXTRACTION W/PHACO Left 10/07/2015   Procedure: CATARACT EXTRACTION PHACO AND INTRAOCULAR LENS PLACEMENT (IOC);  Surgeon: Ronnell Freshwater, MD;  Location: [place];   Service: Ophthalmology;  Laterality: Left;  DIABETIC - oral meds VISION BLUE  . CORONARY ANGIOGRAPHY N/A 10/29/2017   Procedure: CORONARY ANGIOGRAPHY;  Surgeon: Dionisio David, MD;  Location: [place] CV LAB;  Service: Cardiovascular;  Laterality: N/A;  . EXPLORATORY LAPAROTOMY     for fibroids  . LEFT HEART CATH Right 10/29/2017   Procedure: Left Heart Cath;  Surgeon: Dionisio David, MD;  Location: [place] CV LAB;  Service: Cardiovascular;  Laterality: Right;  . TONSILLECTOMY      There were no vitals filed for this visit.  Subjective Assessment - 03/21/18 1602    Subjective   Pt reports having a good day and enjoying her weekend    Pertinent History  84 year old female with adenocarcinoma of the cervix as well as endometrial carcinoma and polycythemia vera as well as diabetes who had hypertensive right cerebellar hemorrhage onset 10/28/2017.  She was treated initially at [facility] and then transferred to [facility].  Neurosurgical evaluation per Dr. Ronnald Ramp concluded no surgery was needed.  The patient completed inpatient rehabilitation at [facility] (including SLP) 11/04/2017-11/18/2107 and was discharged at a 24-hour supervision level.  The patient has hired caregiver to provide 24-hour supervision. Patient has received home health rehab services, including SLP.  Patient Stated Goals  Patient would like to be independent in all tasks and be able to live alone again.    Currently in Pain?  No/denies    Pain Score  0-No pain       OT TREATMENT  Neuromuscular re-education:  Pt completed [facility] task that required her to manipulate small grooved pegs before placing them into a pegboard using her R hand. Pt required increased time to complete task and required verbal cuing to only use the R hand. Pt demonstrated decreased coordination as the task progress and frequently dropped pegs. Pt was unable to store multiple pegs in her palm and translate one to the tips of  her digits to place it into the board.  Self-care:  Pt completed handwriting activity that required her to mark a printed calendar on specified dates, categorize common words, and write out the time on an analog clock. Pt demonstrated ~75% legibility when writing in print form. Pt reported preferring to write in cursive, however pt's legibility decreased to ~50% when writing in cursive. Pt's legibility decreased as the task progressed. Pt reported increased hand fatigue after each writing task.  Therapeutic exercise:  Pt completed hand strengthening task that required her to use her R hand to remove large pegs from a peg board using the grey gripper with the Darene Nappi spring set in the 1st notch. Pt required increased time and verbalized that her hand was fatiguing during the task. Pt dropped x3 pegs towards the end of the task.       OT Education - 03/21/18 1604    Education Details  handwriting, [place], and grip strength    Person(s) Educated  Patient    Methods  Explanation;Demonstration;Verbal cues    Comprehension  Verbalized understanding;Returned demonstration;Verbal cues required          OT Long Term Goals - 03/14/18 1358      OT LONG TERM GOAL #1   Title  Patient will complete bathing with modified independence    Baseline  moderate assist    Time  12    Period  Weeks    Status  On-going    Target Date  04/14/18      OT LONG TERM GOAL #2   Title  Patient will complete dressing skills with modified independence    Baseline  moderate assist    Time  12    Period  Weeks    Status  On-going    Target Date  04/14/18      OT LONG TERM GOAL #3   Title  Patient will complete light meal prep with min assist    Baseline  max assist    Time  12    Period  Weeks    Status  On-going    Target Date  04/14/18      OT LONG TERM GOAL #4   Title  Patient will increase R grip strength by 5# to open jars and containers with modified independence    Baseline  03/14/2018 R grip  strength 24#    Time  12    Period  Weeks    Status  On-going    Target Date  04/14/18      OT LONG TERM GOAL #5   Title  Patient will improve strength by 1 mm grade RUE to assist with obtaining items from the closet.     Baseline  4/5 overall RUE    Time  8    Period  Weeks    Status  On-going    Target Date  04/14/18      Long Term Additional Goals   Additional Long Term Goals  Yes      OT LONG TERM GOAL #6   Title  Pt will increase handwriting legibility and speed to 50% while writing a 3 sentence paragraph in order to write thank you notes and birthday cards for friends and family    Baseline  03/14/2018 - 25% legibility for 3 sentences 10 mins and 13 secs    Time  4    Period  Weeks    Status  New    Target Date  04/14/18            Plan - 03/21/18 1606    Clinical Impression Statement Pt continues to demonstrate deficts in R hand [place]. Pt requires increased time to manipulate small grooved pegs before placing them into pegboard. Pt demonstrates decreased grip endurance as she fatigues quickly during gripping tasks. Pt reports preferring to write using cursive form however demonstrates better legibility when using printed form.    Occupational Profile and client history currently impacting functional performance  family more than 30 mins away, has 24 hour caregiver assist, decreased awareness, memory and safety    Occupational performance deficits (Please refer to evaluation for details):  ADL's;IADL's;Social Participation    Rehab Potential  Good    Current Impairments/barriers affecting progress:  memory, requires 24 hour caregivers,     OT Frequency  2x / week    OT Duration  12 weeks    OT Treatment/Interventions  Self-care/ADL training;Cryotherapy;Therapeutic exercise;DME and/or AE instruction;Functional Mobility Training;Cognitive remediation/compensation;Balance training;Neuromuscular education;Manual Therapy;Moist Heat;Therapeutic activities;Patient/family education     Clinical Decision Making  Several treatment options, min-mod task modification necessary    Consulted and Agree with Plan of Care  Patient    Family Member Consulted  caregiver       Patient will benefit from skilled therapeutic intervention in order to improve the following deficits and impairments:  Decreased balance, Decreased mobility, Difficulty walking, Decreased cognition, Decreased activity tolerance, Decreased coordination, Decreased safety awareness, Decreased strength, Impaired UE functional use  Visit Diagnosis: Muscle weakness (generalized)  Other lack of coordination    Problem List Patient Active Problem List   Diagnosis Date Noted  . Cognitive deficit, post-stroke 12/31/2017  . Ataxia, post-stroke 12/31/2017  . Dysarthria, post-stroke   . Gait disturbance, post-stroke   . H/O cerebral parenchymal hemorrhage 11/04/2017  . Essential hypertension 11/04/2017  . Hyperlipidemia 11/04/2017  . Diabetes ([place]) 11/04/2017  . CAD (coronary artery disease) 11/04/2017  . 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 Hypokalemia 11/04/2017  . Right-sided nontraumatic intracerebral hemorrhage of cerebellum ([place])   . History of cervical cancer   . History of TIA (transient ischemic attack)   . Acute systolic congestive heart failure ([place])   . Reactive hypertension   . Hypernatremia   . Leukocytosis   . Acute blood loss anemia   . Elevated serum creatinine   . Acute respiratory failure with hypoxia ([place])   . IVH (intraventricular hemorrhage) ([place]) 10/29/2017  . Hypoxia 10/28/2017  . Pancreatic lesion 05/24/2017  . 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 Nodule of upper lobe of left lung 06/04/2015  . 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 Cerebral thrombosis with cerebral infarction 04/03/2015  . Cancer of right colon ([place]) 03/28/2015  . CVA (cerebral infarction) 12/21/2014  . Polycythemia vera ([place]) 06/22/2006  Oliver Hum,  OTS 03/21/2018, 4:10 PM   This entire session was performed under direct supervision and direction of a licensed therapist/therapist assistant . I have personally read, edited and approve of the note as written.  Harrel Carina, MS, OTR/L   [place] MAIN [facility] SERVICES [address] [place], [place], [address] Phone: [phone]   Fax:  [phone]  Name: [name] MRN: [id number] Date of Birth: 04-04-1932

## 2018-03-21 NOTE — Therapy (Signed)
[place] MAIN [facility] SERVICES [address] [place], [place], [address] Phone: [phone]   Fax:  [phone]  Speech Language Pathology Treatment  Patient Details  Name: [name] MRN: [id number] Date of Birth: 1931/10/26 Referring Provider (SLP): Charlett Blake    Encounter Date: 03/21/2018  End of Session - 03/21/18 1604    Visit Number  13    Number of Visits  17    Date for SLP Re-Evaluation  03/21/18    SLP Start Time  1400    SLP Stop Time   1454    SLP Time Calculation (min)  54 min    Activity Tolerance  Patient tolerated treatment well       Past Medical History:  Diagnosis Date  . Adenocarcinoma in situ of cervix   . Arthritis    hands  . Breast cancer ([place])   . Colon cancer ([place])   . Diabetes mellitus without complication ([place])   . Endometrial carcinoma ([facility])    s/p total abdominal hysterectomy  . H/O compression fracture of spine 2014   thoracic spine  . H/O polycythemia vera   . H/O TIA (transient ischemic attack) and stroke 09/2014, 03/2015   No deficits  . Hypertension   . Hyperuricemia   . Microalbuminuria   . Polycythemia vera ([place])   . Recurrent falls   . Skin cancer    face, legs  . Stroke [facility]) 2008   no deficits  . Trochanteric bursitis   . Varicose veins    treated    Past Surgical History:  Procedure Laterality Date  . ABDOMINAL HYSTERECTOMY    . BOWEL RESECTION N/A 03/28/2015   Procedure: SMALL BOWEL RESECTION;  Surgeon: Leonie Green, MD;  Location: [facility] ORS;  Service: General;  Laterality: N/A;  . CATARACT EXTRACTION W/ INTRAOCULAR LENS IMPLANT Right   . CATARACT EXTRACTION W/PHACO Left 10/07/2015   Procedure: CATARACT EXTRACTION PHACO AND INTRAOCULAR LENS PLACEMENT (IOC);  Surgeon: Ronnell Freshwater, MD;  Location: [place];  Service: Ophthalmology;  Laterality: Left;  DIABETIC - oral meds VISION BLUE  . CORONARY ANGIOGRAPHY N/A 10/29/2017   Procedure:  CORONARY ANGIOGRAPHY;  Surgeon: Dionisio David, MD;  Location: [place] CV LAB;  Service: Cardiovascular;  Laterality: N/A;  . EXPLORATORY LAPAROTOMY     for fibroids  . LEFT HEART CATH Right 10/29/2017   Procedure: Left Heart Cath;  Surgeon: Dionisio David, MD;  Location: [place] CV LAB;  Service: Cardiovascular;  Laterality: Right;  . TONSILLECTOMY      There were no vitals filed for this visit.  Subjective Assessment - 03/21/18 1603    Subjective  "I sound better- I'm slowing down"            ADULT SLP TREATMENT - 03/21/18 0001      General Information   Behavior/Cognition  Alert;Cooperative;Pleasant mood    HPI   84 year old female with adenocarcinoma of the cervix as well as endometrial carcinoma and polycythemia vera as well as diabetes who had hypertensive right cerebellar hemorrhage onset 10/28/2017.  She was treated initially at [facility] and then transferred to [facility].  Neurosurgical evaluation per Dr. Ronnald Ramp concluded no surgery was needed.  The patient completed inpatient rehabilitation at [facility] (including SLP) 11/04/2017-11/18/2107 and was discharged at a 24-hour supervision level.  The patient has hired caregiver to provide 24-hour supervision. Patient has received home health rehab services, including SLP.  Treatment Provided   Treatment provided  Cognitive-Linquistic      Pain Assessment   Pain Assessment  No/denies pain      Cognitive-Linquistic Treatment   Treatment focused on  Apraxia;Aphasia    Skilled Treatment  HANDWRITING: the patient is concerned regarding her handwriting.  Legibility improves with lined paper to encourage larger letters.  Patient returned with homework showing much improved legibility. Patient able to complete worksheet in session with 100% legibility.  FLUENCY: spontaneous speech is notable for significant episodes of dysfluency.  The patient is 100% fluent when reading words and simple sentences.   Patient is 85% fluency in simple cognitive linguistic task and 75% fluent in more abstract cognitive linguistic task.  The patient is able to state that slowing her speech would be helpful, but she "always talked fast".  WORD FINDING: Patient is able to complete moderately complex word finding worksheets (name object given definition, name given 3 clue words, identify constant characteristics) with 80% accuracy.      Assessment / Recommendations / Plan   Plan  Continue with current plan of care      Progression Toward Goals   Progression toward goals  Progressing toward goals       SLP Education - 03/21/18 1603    Education Details  slow speech to improve fluency    Person(s) Educated  Patient    Methods  Explanation    Comprehension  Verbalized understanding         SLP Long Term Goals - 02/22/18 1454      SLP LONG TERM GOAL #1   Title  Patient will generate fluent, grammatical, and cogent sentence to complete simple/concrete linguistic task with 80% accuracy.    Status  Achieved      SLP LONG TERM GOAL #2   Title  Patient will generate grammatical, fluent, and cogent sentences to complete abstract/complex linguistic task with 80% accuracy.    Status  Partially Met    Target Date  03/21/18      SLP LONG TERM GOAL #3   Title  Patient will identify cognitive-communication barriers and participate in developing functional compensatory strategies.    Status  Deferred      SLP LONG TERM GOAL #4   Title  Patient will complete moderately complex executive function skills tasks with 80% accuracy.    Status  Deferred       Plan - 03/21/18 1604    Clinical Impression Statement  The patient is most fluent under very controlled setting, such as reading aloud.  Her fluency decreases as the cognitive load of the task increases.  She is generating more legible handwriting.  She demonstrates improved insight into fluency strategies.  Will benefit continue ST to address self-expression,  fluency, and word finding.    Speech Therapy Frequency  2x / week    Duration  Other (comment)    Treatment/Interventions  Compensatory strategies;Patient/family education;Cognitive reorganization;Language facilitation;Internal/external aids    Potential to Achieve Goals  Good    Potential Considerations  Ability to learn/carryover information;Pain level;Family/community support;Co-morbidities;Previous level of function;Cooperation/participation level;Severity of impairments;Medical prognosis    SLP Home Exercise Plan  writing, word finding work Financial trader and Agree with Plan of Care  Patient       Patient will benefit from skilled therapeutic intervention in order to improve the following deficits and impairments:   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 Cognitive deficit, post-stroke 12/31/2017  . Ataxia, post-stroke 12/31/2017  . Dysarthria, post-stroke   . Gait disturbance, post-stroke   . H/O cerebral parenchymal hemorrhage 11/04/2017  . Essential hypertension 11/04/2017  . Hyperlipidemia 11/04/2017  . Diabetes ([place]) 11/04/2017  . CAD (coronary artery disease) 11/04/2017  . 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 Hypokalemia 11/04/2017  . Right-sided nontraumatic intracerebral hemorrhage of cerebellum ([place])   . History of cervical cancer   . History of TIA (transient ischemic attack)   . Acute systolic congestive heart failure ([place])   . Reactive hypertension   . Hypernatremia   . Leukocytosis   . Acute blood loss anemia   . Elevated serum creatinine   . Acute respiratory failure with hypoxia ([place])   . IVH (intraventricular hemorrhage) ([place]) 10/29/2017  . Hypoxia 10/28/2017  . Pancreatic lesion 05/24/2017  . 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 Nodule of upper lobe of left lung 06/04/2015  . 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 Cerebral thrombosis with cerebral infarction 04/03/2015  . Cancer of right colon ([place]) 03/28/2015  . CVA (cerebral infarction) 12/21/2014  . Polycythemia vera ([place]) 06/22/2006   Leroy Sea, MS/CCC- SLP  Lou Miner 03/21/2018, 4:05 PM  [place] MAIN [facility] SERVICES [address] [place], [place], [address] Phone: [phone]   Fax:  [phone]   Name: [name] MRN: [id number] Date of Birth: 1932/06/06

## 2018-03-23 ENCOUNTER — Ambulatory Visit: Payer: Medicare Other | Admitting: Adult Health

## 2018-03-23 ENCOUNTER — Encounter: Payer: Self-pay | Admitting: Adult Health

## 2018-03-23 VITALS — BP 117/67 | HR 59 | Ht 62.0 in | Wt 106.8 lb

## 2018-03-23 DIAGNOSIS — R471 Dysarthria and anarthria: Secondary | ICD-10-CM

## 2018-03-23 DIAGNOSIS — E785 Hyperlipidemia, unspecified: Secondary | ICD-10-CM | POA: Diagnosis not present

## 2018-03-23 DIAGNOSIS — I10 Essential (primary) hypertension: Secondary | ICD-10-CM

## 2018-03-23 DIAGNOSIS — E118 Type 2 diabetes mellitus with unspecified complications: Secondary | ICD-10-CM

## 2018-03-23 DIAGNOSIS — I615 Nontraumatic intracerebral hemorrhage, intraventricular: Secondary | ICD-10-CM

## 2018-03-23 DIAGNOSIS — Z794 Long term (current) use of insulin: Secondary | ICD-10-CM

## 2018-03-23 NOTE — Progress Notes (Signed)
[facility] [address] [place]. [place] [address] [phone]       OFFICE FOLLOW UP NOTE  [name] Date of Birth:  Dec 21, 1931 Medical Record Number:  [id number]   Reason for visit: stroke follow up  CHIEF COMPLAINT:  Chief Complaint  Patient presents with  . Follow-up    IVH hemorrhage follow up room 9 patient with neice Suellen    HPI: [name] is being seen today in the office for right ICH of cerebellum on 10/29/17. History obtained from patient and chart review. Reviewed all radiology images and labs personally.  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 CTA Chest revealed pericardial effusion, small pleural effusions, and pulmonary edema. She was treated for sepsis secondary to pneumonia. She is on ASA and Plavix. She was noted to have NSTEMI and underwent cardiac cath which showed 3 vessel disease and severe LV dysfunction. Following cath procedure patient had a head CT which showedright cerebellar hemorrhagewith IVH.She was supposed to receive heparin drip, but was held. Patient transferred to [facility] hospital for further management.Hemorrhage was felt to be secondary to hypertensive etiology on dual antiplatelet therapy.  Neuro wise, she was at risk for cerebral edema and treated with 3%.  There was no neurosurgery intervention that would benefit her.  She continued to progress though still had issues with pulmonary edema and CHF.  Developed leukocytosis, etiology unknown, but resolved during admission.  Once stable, was transferred to the inpatient rehab for ongoing therapy.  Plans are for PCI with stent in the near future. Recommended cardiology to follow-up in 2  weeks. Recommended to resume aspirin at time of discharge from inpatient (7 days) and resume Plavix in 3 weeks.  12/17/17 visit: Patient is being seen today for hospital follow-up and is accompanied by her niece.  She continues to improve from a neurological standpoint but does have mild right-sided weakness along with dysarthria.  She continues to participate in home PT/OT/ST.  She does live independently but does have 24/7 care.  Continues to take aspirin 81 mg at this time without bleeding or bruising.  They had not start Plavix as they state they need clearance from [place] first.  Continues to take pravastatin without myalgias.  Per niece, cardiologist states she needs to be on Plavix for 2 weeks prior to being considered for stent placement.  Blood pressure today satisfactory 124/69.  Patient denies new or worsening stroke/TIA symptoms.  Interval history 03/23/18: Patient is being seen today for scheduled follow-up visit and is accompanied by her niece.  She continues to have some stuttering concerns and right hand weakness but has been improving.  She participates in PT/OT/ST at [facility] and continues to live independently with 24/7 care.  Aids to assist with ADLs and IADLs.  Patient is questioning whether therapy continued at this time.  She continues to take Plavix only without side effects of bleeding or bruising.  She was planning on undergoing stent procedure by cardiologist at [place] but this ended up being canceled and scheduled appointment with cardiologist through [place] system who believes there is no benefit for stent placement and recommended continuation of medication management and monitoring.  Continues to take pravastatin without side effects myalgias.  Blood pressure today satisfactory  117/67.  Niece does state that current cardiologist has been managing antihypertensives and continues to fluctuate with SBP ranging from 1 10-1 60.  Denies new or worsening stroke/TIA  symptoms.    ROS:   14 system review of systems performed and negative with exception of ringing in ears, eye discharge, constipation, snoring, environmental allergies, walking difficulty, bruise easily, speech difficulty and weakness  PMH:  Past Medical History:  Diagnosis Date  . Adenocarcinoma in situ of cervix   . Arthritis    hands  . Breast cancer ([place])   . Colon cancer ([place])   . Diabetes mellitus without complication ([place])   . Endometrial carcinoma ([facility])    s/p total abdominal hysterectomy  . H/O compression fracture of spine 2014   thoracic spine  . H/O polycythemia vera   . H/O TIA (transient ischemic attack) and stroke 09/2014, 03/2015   No deficits  . Hypertension   . Hyperuricemia   . Microalbuminuria   . Polycythemia vera ([place])   . Recurrent falls   . Skin cancer    face, legs  . Stroke [facility]) 2008   no deficits  . Trochanteric bursitis   . Varicose veins    treated    PSH:  Past Surgical History:  Procedure Laterality Date  . ABDOMINAL HYSTERECTOMY    . BOWEL RESECTION N/A 03/28/2015   Procedure: SMALL BOWEL RESECTION;  Surgeon: Leonie Green, MD;  Location: [facility] ORS;  Service: General;  Laterality: N/A;  . CATARACT EXTRACTION W/ INTRAOCULAR LENS IMPLANT Right   . CATARACT EXTRACTION W/PHACO Left 10/07/2015   Procedure: CATARACT EXTRACTION PHACO AND INTRAOCULAR LENS PLACEMENT (IOC);  Surgeon: Ronnell Freshwater, MD;  Location: [facility];  Service: Ophthalmology;  Laterality: Left;  DIABETIC - oral meds VISION BLUE  . CORONARY ANGIOGRAPHY N/A 10/29/2017   Procedure: CORONARY ANGIOGRAPHY;  Surgeon: Dionisio David, MD;  Location: [facility];  Service: Cardiovascular;  Laterality: N/A;  . EXPLORATORY LAPAROTOMY     for fibroids  . LEFT HEART CATH Right 10/29/2017   Procedure: Left Heart Cath;  Surgeon: Dionisio David, MD;  Location: [facility];  Service: Cardiovascular;  Laterality: Right;  . TONSILLECTOMY       Social History:  Social History   Socioeconomic History  . Marital status: Widowed    Spouse name: Not on file  . Number of children: Not on file  . Years of education: Not on file  . Highest education level: Not on file  Occupational History  . Not on file  Social Needs  . Financial resource strain: Not on file  . Food insecurity:    Worry: Not on file    Inability: Not on file  . Transportation needs:    Medical: Not on file    Non-medical: Not on file  Tobacco Use  . Smoking status: Former Research scientist (life sciences)  . Smokeless tobacco: Never Used  . Tobacco comment: quit 30+ yrs ago  Substance and Sexual Activity  . Alcohol use: No  . Drug use: No  . Sexual activity: Not on file  Lifestyle  . Physical activity:    Days per week: Not on file    Minutes per session: Not on file  . Stress: Not on file  Relationships  . Social connections:    Talks on phone: Not on file    Gets together: Not on file    Attends religious service: Not on file    Active member of club  or organization: Not on file    Attends meetings of clubs or organizations: Not on file    Relationship status: Not on file  . Intimate partner violence:    Fear of current or ex partner: Not on file    Emotionally abused: Not on file    Physically abused: Not on file    Forced sexual activity: Not on file  Other Topics Concern  . Not on file  Social History Narrative  . Not on file    Family History:  Family History  Problem Relation Age of Onset  . Cancer Brother        AML  . Heart attack Mother   . Breast cancer Mother   . Heart attack Father   . Heart attack Sister   . Diabetes Sister     Medications:   Current Outpatient Medications on File Prior to Visit  Medication Sig Dispense Refill  . amLODipine (NORVASC) 2.5 MG tablet Take 1 tablet (2.5 mg total) by mouth daily. 30 tablet 0  . aspirin 81 MG chewable tablet Chew 1 tablet (81 mg total) by mouth daily.    . blood glucose meter kit and  supplies Dispense based on patient and insurance preference. Use up to four times daily as directed. (FOR ICD-10 E10.9, E11.9). 1 each 0  . calcium-vitamin D (OSCAL WITH D) 500-200 MG-UNIT per tablet Take 1 tablet by mouth daily.     . carvedilol (COREG) 25 MG tablet Take 1 tablet (25 mg total) by mouth 2 (two) times daily with a meal. 60 tablet 0  . clopidogrel (PLAVIX) 75 MG tablet Take 1 tablet (75 mg total) by mouth daily. 90 tablet 4  . docusate sodium (COLACE) 250 MG capsule Take 250 mg by mouth daily as needed for constipation.    . fexofenadine (ALLEGRA ALLERGY) 180 MG tablet Take 1 tablet (180 mg total) by mouth daily.    . hydroxyurea (HYDREA) 500 MG capsule Take 1 tablet daily except on Saturdays 30 capsule 0  . insulin detemir (LEVEMIR) 100 unit/ml SOLN Inject 0.08 mLs (8 Units total) into the skin at bedtime. (Patient taking differently: Inject 10 Units into the skin at bedtime. ) 8 mL 1  . nitroGLYCERIN (NITRODUR - DOSED IN MG/24 HR) 0.2 mg/hr patch Place 1 patch (0.2 mg total) onto the skin daily. 30 patch 12  . pantoprazole (PROTONIX) 40 MG tablet Take 1 tablet (40 mg total) by mouth daily. 30 tablet 0  . pravastatin (PRAVACHOL) 20 MG tablet Take 1 tablet (20 mg total) by mouth at bedtime. 30 tablet 0  . Probiotic Product (PROBIOTIC DAILY PO) Take 1 tablet by mouth daily.    . ramipril (ALTACE) 10 MG capsule Take 1 capsule (10 mg total) by mouth 2 (two) times daily. (Patient taking differently: Take 5 mg by mouth 2 (two) times daily. ) 60 capsule 0  . sertraline (ZOLOFT) 25 MG tablet Take 25 mg by mouth daily.    Marland Kitchen spironolactone (ALDACTONE) 25 MG tablet Take 1 tablet (25 mg total) by mouth daily. 30 tablet 0   No current facility-administered medications on file prior to visit.     Allergies:   Allergies  Allergen Reactions  . Simvastatin     Other reaction(s): Muscle Pain     Physical Exam  Vitals:   03/23/18 1505  BP: 117/67  Pulse: (!) 59  Weight: 106 lb 12.8 oz  (48.4 kg)  Height: _0  (1.575 m)   Body mass  index is 19.53 kg/m. No exam data present  General: Frail elderly pleasant Caucasian female, seated, in no evident distress Head: head normocephalic and atraumatic.   Neck: supple with no carotid or supraclavicular bruits Cardiovascular: regular rate and rhythm, no murmurs Musculoskeletal: no deformity Skin:  no rash/petichiae Vascular:  Normal pulses all extremities  Neurologic Exam Mental Status: Awake and fully alert.  Mild dysarthria present.  Oriented to place and time. Recent and remote memory intact. Attention span, concentration and fund of knowledge appropriate. Mood and affect appropriate.  Cranial Nerves: Fundoscopic exam reveals sharp disc margins. Pupils equal, briskly reactive to light. Extraocular movements full without nystagmus. Visual fields full to confrontation. Hearing intact. Facial sensation intact. Face, tongue, palate moves normally and symmetrically.  Motor: Normal bulk and tone.  Full strength throughout all extremities tested except for very mild right hand grip weakness  Sensory.: intact to touch , pinprick , position and vibratory sensation.  Coordination: Rapid alternating movements normal in all extremities. Finger-to-nose and heel-to-shin performed accurately bilaterally. Gait and Station: Arises from chair with mild difficulty. Stance is hunched. gait demonstrates short cautious steps with occasional staggering steps with assistance of rolling walker Reflexes: 1+ and symmetric. Toes downgoing.      Diagnostic Data (Labs, Imaging, Testing)  Ct Head Wo Contrast 10/29/2017 Stable RIGHT cerebellar hemorrhage, with extension into the IV ventricle without hydrocephalus.  Ct Head Wo Contrast 10/29/2017 RIGHT cerebellar parenchymal hemorrhage approximately 31 x 16 x 19 mm. There is extension of hemorrhage into the fourth ventricle, without hydrocephalus. A hypertensive bleed is most likely, but unrecognized  trauma, anticoagulation, vascular malformation, hemorrhagic metastatic neoplasm, are all considerations.   Ct Head Wo Contrast 10/31/2017 Stable RIGHT cerebellar parenchymal hemorrhage, IVH with no hydrocephalus   Ct Head Wo Contrast 11/03/2017 1. Stable small hemorrhage within the right cerebellar hemisphere. Stable mild associated edema and local mass effect. 2. No new acute intracranial abnormality. 3. Stable chronic microvascular ischemic changes and parenchymal volume loss of the brain.  MRI head 11/01/2017 1. Unchanged right cerebellar hematoma with mild intraventricular extension. No hydrocephalus. 2. No acute infarct separate from the hematoma. 3. Moderate chronic small vessel ischemic disease.  Transthoracic Echocardiogram  10/29/2017 - Left ventricle: The cavity size was severely dilated. Systolicfunction was severely reduced. The estimated ejection fractionwas 25%.Akinesis of the anteroseptal myocardium. Akinesis of theanterior myocardium. - Aortic valve: There was trivial regurgitation. Valve area (VTI):1.68 cm^2. Valve area (Vmax): 1.9 cm^2. Valve area (Vmean): 1.81cm^2. - Mitral valve: There was mild regurgitation. Valve area bycontinuity equation (using LVOT flow): 2.19 cm^2. - Left atrium: The atrium was mildly dilated. - Right ventricle: The cavity size was mildly dilated. - Right atrium: The atrium was mildly dilated. - Pericardium, extracardiac: A trivial pericardial effusion wasidentified posterior to the heart. Features were not consistentwith tamponade physiology. There was a left pleural effusion. - 4 Chamber dilatation with severe LV systolic dysfunction withanteroapical akinesis sugestive ASWMI, and no thrombii present inLV. Fibrocalcified aortic valve without AS.  Dg Chest 1 View 10/28/2017 1. Cardiomegaly with new perihilar opacities, presumably pulmonary edema, suggesting CHF/volume overload. Pneumonia is considered less likely. 2. Aortic  atherosclerosis.   Ct Angio Chest Pe W And/or Wo Contrast 10/28/2017 1. Pericardial effusion and cardiomegaly.Coronary artery disease.  2. Parenchymal changes consistent with pulmonary edema and small pleural effusions.  3. Atherosclerotic calcification of the thoracic aorta stable aneurysmal dilatationof the aorticarch. Recommend annual imaging followup by CTA or MRA.This recommendation follows 2010 ACCF/AHA/AATS/ACR/ASA/SCA/SCAI/SIR/STS/SVM Guidelines for the Diagnosis and Management of  Patients with Thoracic Aortic Disease. Circulation.2010; 121: K327-M147  4. Aortic aneurysm NOS (ICD10-I71.9). Aortic Atherosclerosis (ICD10-I70.0).  5. Stable probable scar at the LEFT lung apex. 6. Numerous thoracic compression fractures.   Dg Chest Port 1 View 10/30/2017 Cardiomegaly with findings of CHF and small bilateral pleural effusions. No pneumothorax.   Left Heart Cath- Neoma Laming MD [facility] 10/29/2017  Ost Cx to Prox Cx lesion is 80% stenosed with 90% stenosed side branch in Ost 1st Mrg to 1st Mrg.  Mid LAD lesion is 95% stenosed.  Mid RCA lesion is 75% stenosed.  Ost LAD to Prox LAD lesion is 70% stenosed. Severe 3 vessel disease with severe LV systolic dysfunction and anteroapical aneurysm. Advise CABG at [facility].     ASSESSMENT: [name] is a 84 y.o. year old female here with right cerebellar parenchymal hemorrhage with IVH on 10/31/17 secondary to hypertensive plus DAPT. Vascular risk factors include HTN, DM, CAD, decreased EF and CHF.  Patient is being seen today for scheduled follow-up visit and continues to have dysarthria with mild right grip weakness but otherwise has been stable from a stroke standpoint.    PLAN: -Continue Plavix 75 mg daily and pravastatin for secondary stroke prevention -F/u with PCP regarding your HLD, HTN and DM management -f/u with cardiologist as scheduled -Continue outpatient PT/OT/ST for continued improvement -Patient was  advised to continue 24/7 care at this time and speak with OT/ST in regards to possibly stopping nighttime care in the future but continuing daytime as she does need assistance with ADLs/IADLs -Advised to continue to stay active along with doing home exercises and maintain a healthy diet -continue to monitor BP at home -Maintain strict control of hypertension with blood pressure goal below 130/90, diabetes with hemoglobin A1c goal below 6.5% and cholesterol with LDL cholesterol (bad cholesterol) goal below 70 mg/dL. I also advised the patient to eat a healthy diet with plenty of whole grains, cereals, fruits and vegetables, exercise regularly and maintain ideal body weight.  Follow up in 6 months or call earlier if needed   Greater than 50% of time during this 25 minute visit was spent on counseling,explanation of diagnosis of ICH, reviewing risk factor management of HLD, HTN and DM, planning of further management, discussion with patient and family and coordination of care   Venancio Poisson, [facility]  [facility] [address] [place], [place] [address]  Phone [phone] Fax [phone]

## 2018-03-23 NOTE — Patient Instructions (Signed)
Continue clopidogrel 75 mg daily  and pravastatin  for secondary stroke prevention  Continue to follow up with PCP regarding cholesterol and blood pressure management   Continue to do PT/OT/ST for continued improvement  Continue to follow up with cardiologist as scheduled   Continue to monitor blood pressure at home  Maintain strict control of hypertension with blood pressure goal below 130/90, diabetes with hemoglobin A1c goal below 6.5% and cholesterol with LDL cholesterol (bad cholesterol) goal below 70 mg/dL. I also advised the patient to eat a healthy diet with plenty of whole grains, cereals, fruits and vegetables, exercise regularly and maintain ideal body weight.  Followup in the future with me in 6 months or call earlier if needed       Thank you for coming to see [place] at [facility]. I hope we have been able to provide you high quality care today.  You may receive a patient satisfaction survey over the next few weeks. We would appreciate your feedback and comments so that we may continue to improve ourselves and the health of our patients.

## 2018-03-24 ENCOUNTER — Ambulatory Visit: Payer: Medicare Other | Admitting: Speech Pathology

## 2018-03-24 ENCOUNTER — Ambulatory Visit: Payer: Medicare Other | Attending: Physical Medicine & Rehabilitation | Admitting: Physical Therapy

## 2018-03-24 ENCOUNTER — Encounter: Payer: Self-pay | Admitting: Physical Therapy

## 2018-03-24 ENCOUNTER — Encounter: Payer: Self-pay | Admitting: Speech Pathology

## 2018-03-24 DIAGNOSIS — I69123 Fluency disorder following nontraumatic intracerebral hemorrhage: Secondary | ICD-10-CM

## 2018-03-24 DIAGNOSIS — R278 Other lack of coordination: Secondary | ICD-10-CM

## 2018-03-24 DIAGNOSIS — R41841 Cognitive communication deficit: Secondary | ICD-10-CM | POA: Insufficient documentation

## 2018-03-24 DIAGNOSIS — M6281 Muscle weakness (generalized): Secondary | ICD-10-CM | POA: Insufficient documentation

## 2018-03-24 DIAGNOSIS — R262 Difficulty in walking, not elsewhere classified: Secondary | ICD-10-CM | POA: Diagnosis present

## 2018-03-24 NOTE — Therapy (Signed)
[place] MAIN [facility] SERVICES [address] [place], [place], [address] Phone: [phone]   Fax:  [phone]  Speech Language Pathology Treatment  Patient Details  Name: [name] MRN: [id number] Date of Birth: [birth year] Referring Provider (SLP): Charlett Blake    Encounter Date: 03/24/2018  End of Session - 03/24/18 1551    Visit Number  14    Number of Visits  17    Date for SLP Re-Evaluation  03/21/18    SLP Start Time  1400    SLP Stop Time   1452    SLP Time Calculation (min)  52 min    Activity Tolerance  Patient tolerated treatment well       Past Medical History:  Diagnosis Date  . Adenocarcinoma in situ of cervix   . Arthritis    hands  . Breast cancer ([place])   . Colon cancer ([place])   . Diabetes mellitus without complication ([place])   . Endometrial carcinoma (HCC)    s/p total abdominal hysterectomy  . H/O compression fracture of spine 2014   thoracic spine  . H/O polycythemia vera   . H/O TIA (transient ischemic attack) and stroke 09/2014, 03/2015   No deficits  . Hypertension   . Hyperuricemia   . Microalbuminuria   . Polycythemia vera ([place])   . Recurrent falls   . Skin cancer    face, legs  . Stroke [facility]) 2008   no deficits  . Trochanteric bursitis   . Varicose veins    treated    Past Surgical History:  Procedure Laterality Date  . ABDOMINAL HYSTERECTOMY    . BOWEL RESECTION N/A 03/28/2015   Procedure: SMALL BOWEL RESECTION;  Surgeon: Leonie Green, MD;  Location: [facility] ORS;  Service: General;  Laterality: N/A;  . CATARACT EXTRACTION W/ INTRAOCULAR LENS IMPLANT Right   . CATARACT EXTRACTION W/PHACO Left 10/07/2015   Procedure: CATARACT EXTRACTION PHACO AND INTRAOCULAR LENS PLACEMENT (IOC);  Surgeon: Ronnell Freshwater, MD;  Location: [place];  Service: Ophthalmology;  Laterality: Left;  DIABETIC - oral meds VISION BLUE  . CORONARY ANGIOGRAPHY N/A 10/29/2017   Procedure:  CORONARY ANGIOGRAPHY;  Surgeon: Dionisio David, MD;  Location: [place] CV LAB;  Service: Cardiovascular;  Laterality: N/A;  . EXPLORATORY LAPAROTOMY     for fibroids  . LEFT HEART CATH Right 10/29/2017   Procedure: Left Heart Cath;  Surgeon: Dionisio David, MD;  Location: [place] CV LAB;  Service: Cardiovascular;  Laterality: Right;  . TONSILLECTOMY      There were no vitals filed for this visit.  Subjective Assessment - 03/24/18 1551    Subjective  "I sound better- I'm slowing down"            ADULT SLP TREATMENT - 03/24/18 0001      General Information   Behavior/Cognition  Alert;Cooperative;Pleasant mood    HPI   84 year old female with adenocarcinoma of the cervix as well as endometrial carcinoma and polycythemia vera as well as diabetes who had hypertensive right cerebellar hemorrhage onset 10/28/2017.  She was treated initially at [facility] and then transferred to [facility].  Neurosurgical evaluation per Dr. Ronnald Ramp concluded no surgery was needed.  The patient completed inpatient rehabilitation at [facility] (including SLP) 11/04/2017-11/18/2107 and was discharged at a 24-hour supervision level.  The patient has hired caregiver to provide 24-hour supervision. Patient has received home health rehab services, including SLP.  Treatment Provided   Treatment provided  Cognitive-Linquistic      Pain Assessment   Pain Assessment  No/denies pain      Cognitive-Linquistic Treatment   Treatment focused on  Apraxia;Aphasia    Skilled Treatment  HANDWRITING: the patient is concerned regarding her handwriting.  Legibility improves with lined paper to encourage larger letters.  Patient returned with homework showing much improved legibility. Patient able to complete worksheet in session with 100% legibility.  FLUENCY: spontaneous speech is notable for significant episodes of dysfluency.  The patient is 100% fluent when reading words and simple sentences.   Patient is 85% fluency in simple cognitive linguistic task and 75% fluent in more abstract cognitive linguistic task.  The patient is able to state that slowing her speech would be helpful, but she "always talked fast".  WORD FINDING: Patient is able to complete moderately complex word finding worksheets (name object given definition, name given 3 clue words, identify constant characteristics) with 80% accuracy.      Assessment / Recommendations / Plan   Plan  Continue with current plan of care      Progression Toward Goals   Progression toward goals  Progressing toward goals       SLP Education - 03/24/18 1551    Education Details  slow speech to improve fluency    Person(s) Educated  Patient    Methods  Explanation    Comprehension  Verbalized understanding         SLP Long Term Goals - 02/22/18 1454      SLP LONG TERM GOAL #1   Title  Patient will generate fluent, grammatical, and cogent sentence to complete simple/concrete linguistic task with 80% accuracy.    Status  Achieved      SLP LONG TERM GOAL #2   Title  Patient will generate grammatical, fluent, and cogent sentences to complete abstract/complex linguistic task with 80% accuracy.    Status  Partially Met    Target Date  03/21/18      SLP LONG TERM GOAL #3   Title  Patient will identify cognitive-communication barriers and participate in developing functional compensatory strategies.    Status  Deferred      SLP LONG TERM GOAL #4   Title  Patient will complete moderately complex executive function skills tasks with 80% accuracy.    Status  Deferred       Plan - 03/24/18 1552    Clinical Impression Statement  The patient is most fluent under very controlled setting, such as reading aloud.  Her fluency decreases as the cognitive load of the task increases.  She is generating more legible handwriting.  She demonstrates improved insight into fluency strategies.  Will benefit continue ST to address self-expression,  fluency, and word finding.    Speech Therapy Frequency  2x / week    Duration  Other (comment)    Treatment/Interventions  Compensatory strategies;Patient/family education;Cognitive reorganization;Language facilitation;Internal/external aids    Potential to Achieve Goals  Good    Potential Considerations  Ability to learn/carryover information;Pain level;Family/community support;Co-morbidities;Previous level of function;Cooperation/participation level;Severity of impairments;Medical prognosis    SLP Home Exercise Plan  writing, word finding work Financial trader and Agree with Plan of Care  Patient       Patient will benefit from skilled therapeutic intervention in order to improve the following deficits and impairments:   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 Cognitive deficit, post-stroke 12/31/2017  . Ataxia, post-stroke 12/31/2017  . Dysarthria, post-stroke   . Gait disturbance, post-stroke   . H/O cerebral parenchymal hemorrhage 11/04/2017  . Essential hypertension 11/04/2017  . Hyperlipidemia 11/04/2017  . Diabetes ([place]) 11/04/2017  . CAD (coronary artery disease) 11/04/2017  . 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 Hypokalemia 11/04/2017  . Right-sided nontraumatic intracerebral hemorrhage of cerebellum ([place])   . History of cervical cancer   . History of TIA (transient ischemic attack)   . Acute systolic congestive heart failure ([place])   . Reactive hypertension   . Hypernatremia   . Leukocytosis   . Acute blood loss anemia   . Elevated serum creatinine   . Acute respiratory failure with hypoxia ([place])   . IVH (intraventricular hemorrhage) ([place]) 10/29/2017  . Hypoxia 10/28/2017  . Pancreatic lesion 05/24/2017  . 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 Nodule of upper lobe of left lung 06/04/2015  . 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 Cerebral thrombosis with cerebral infarction 04/03/2015  . Cancer of right colon ([place]) 03/28/2015  . CVA (cerebral infarction) 12/21/2014  . Polycythemia vera ([place]) 06/22/2006   Leroy Sea, MS/CCC- SLP  Lou Miner 03/24/2018, 3:53 PM  [place] MAIN [facility] SERVICES [address] [place], [place], [address] Phone: [phone]   Fax:  [phone]   Name: [name] MRN: [id number] Date of Birth: 05-Sep-1931

## 2018-03-24 NOTE — Patient Instructions (Signed)
Access Code: [email or web address]  Patient Portal: [email or web address]

## 2018-03-24 NOTE — Therapy (Signed)
[place] MAIN [facility] SERVICES [address] [place], [place], [address] Phone: [phone]   Fax:  [phone]  Physical Therapy Treatment Physical Therapy Progress Note   Dates of reporting period  01/31/2018   to   03/24/2018     Patient Details  Name: [name] MRN: [id number] Date of Birth: 06-04-1932 Referring Provider (PT): Charlett Blake   Encounter Date: 03/24/2018  PT End of Session - 03/24/18 1319    Visit Number  7    Number of Visits  25    Date for PT Re-Evaluation  04/25/18    Authorization Type  7/10 with [facility] 01/31/18    PT Start Time  1304    PT Stop Time  1345    PT Time Calculation (min)  41 min    Equipment Utilized During Treatment  Gait belt    Activity Tolerance  Patient tolerated treatment well    Behavior During Therapy  WFL for tasks assessed/performed       Past Medical History:  Diagnosis Date  . Adenocarcinoma in situ of cervix   . Arthritis    hands  . Breast cancer ([place])   . Colon cancer ([place])   . Diabetes mellitus without complication ([place])   . Endometrial carcinoma (HCC)    s/p total abdominal hysterectomy  . H/O compression fracture of spine 2014   thoracic spine  . H/O polycythemia vera   . H/O TIA (transient ischemic attack) and stroke 09/2014, 03/2015   No deficits  . Hypertension   . Hyperuricemia   . Microalbuminuria   . Polycythemia vera ([place])   . Recurrent falls   . Skin cancer    face, legs  . Stroke [facility]) 2008   no deficits  . Trochanteric bursitis   . Varicose veins    treated    Past Surgical History:  Procedure Laterality Date  . ABDOMINAL HYSTERECTOMY    . BOWEL RESECTION N/A 03/28/2015   Procedure: SMALL BOWEL RESECTION;  Surgeon: Leonie Green, MD;  Location: [facility] ORS;  Service: General;  Laterality: N/A;  . CATARACT EXTRACTION W/ INTRAOCULAR LENS IMPLANT Right   . CATARACT EXTRACTION W/PHACO Left 10/07/2015   Procedure: CATARACT EXTRACTION PHACO AND  INTRAOCULAR LENS PLACEMENT (IOC);  Surgeon: Ronnell Freshwater, MD;  Location: [place];  Service: Ophthalmology;  Laterality: Left;  DIABETIC - oral meds VISION BLUE  . CORONARY ANGIOGRAPHY N/A 10/29/2017   Procedure: CORONARY ANGIOGRAPHY;  Surgeon: Dionisio David, MD;  Location: [place] CV LAB;  Service: Cardiovascular;  Laterality: N/A;  . EXPLORATORY LAPAROTOMY     for fibroids  . LEFT HEART CATH Right 10/29/2017   Procedure: Left Heart Cath;  Surgeon: Dionisio David, MD;  Location: [place] [facility] LAB;  Service: Cardiovascular;  Laterality: Right;  . TONSILLECTOMY      There were no vitals filed for this visit.  Treatment  BLE leg press 60# x10 reps, VCs for slowing eccentric return and min A for positioning LEs onto footplate  OUTCOME MEASURES: TEST Baseline 03/24/2018 Interpretation  5 times sit<>stand 34.21s 22.9s >60 yo, <15 sec indicates increased risk for falls  10 meter walk test 0.49 m/s (w/ RW) 1.01 m/s (w/ RW) >1.0 m/s indicates increased risk for falls; limited community ambulator  Timed up and Go 22.07 s  (w/ RW) 14.2 s  (w/ RW) <14 sec indicates increased risk for falls  6 minute walk test 550 ft  (w/ RW)  795 ft (w/ RW) 1000 feet is community ambulator   Ascend/descend 4 stairs, CGA, step-to pattern, single UE support  PT Education - 03/24/18 1316    Education Details  exercise technique/ form    Person(s) Educated  Patient    Methods  Explanation;Demonstration;Verbal cues    Comprehension  Verbalized understanding;Returned demonstration;Verbal cues required;Need further instruction       PT Short Term Goals - 03/24/18 1328      PT SHORT TERM GOAL #1   Title  Patient will reduce timed up and go to <11 seconds to reduce fall risk and demonstrate improved transfer/gait ability.    Baseline  22.07 sec; 03/24/18: 14.2sec;     Time  6    Period  Weeks    Status  On-going    Target Date  02/28/18      PT SHORT TERM GOAL #2   Title   Patient will be independent with ascend/descend 2 steps using single UE in step over step pattern without LOB.    Baseline  03/24/18: CGA, single UE support, step-to pattern without LOB;     Time  6    Period  Weeks    Status  On-going    Target Date  02/28/18        PT Long Term Goals - 03/24/18 1325      PT LONG TERM GOAL #1   Title  Patient will be independent in home exercise program to improve strength/mobility for better functional independence with ADLs.    Time  12    Period  Weeks    Status  On-going    Target Date  04/25/18      PT LONG TERM GOAL #2   Title  Patient (> 72 years old) will complete five times sit to stand test in < 15 seconds indicating an increased LE strength and improved balance    Baseline  34.21 sec; 03/24/2018: 22.9 sec    Time  12    Period  Weeks    Status  On-going    Target Date  04/25/18      PT LONG TERM GOAL #3   Title  Patient will increase six minute walk test distance to >1000 for progression to community ambulator and improve gait ability    Baseline  550 feet; 03/24/18: 795 ft.;     Time  12    Period  Weeks    Status  On-going    Target Date  04/25/18      PT LONG TERM GOAL #4   Title  Patient will increase 10 meter walk test to >1.4m/s as to improve gait speed for better community ambulation and to reduce fall risk    Baseline  .49 m/sec; 03/24/18: 1.01 m/sec;    Time  12    Period  Weeks    Status  Achieved    Target Date  04/25/18            Plan - 03/24/18 1321    Clinical Impression Statement Patient presents to clinic with no pain and was amenable to therapy. Patient reports increased confidence during ambulation in the community and around her home. She demonstrated significant improvements in her mobility as evidenced by her gait speed (1.01 m/s) and TUG score (14.2 s). She continues to have deficits in strength apparent when ascending/descending steps where she uses a step-to pattern requiring UE support and CGA to  ensure safety and no LOB. She will continue to benefit  from skilled therapeutic intervention to address her deficits in mobility, strength, and balance in order to improve her independence, overall function, and QOL. Patient's condition has the potential to improve in response to therapy. Maximum improvement is yet to be obtained. The anticipated improvement is attainable and reasonable in a generally predictable time.    Rehab Potential  Fair    PT Frequency  2x / week    PT Duration  12 weeks    PT Treatment/Interventions  Patient/family education;Neuromuscular re-education;Balance training;Stair training;Therapeutic activities;Therapeutic exercise;Gait training;Aquatic Therapy    PT Next Visit Plan  balance and strengthening    Consulted and Agree with Plan of Care  Patient;Family member/caregiver       Patient will benefit from skilled therapeutic intervention in order to improve the following deficits and impairments:  Abnormal gait, Decreased balance, Decreased endurance, Decreased mobility, Impaired flexibility, Decreased strength, Decreased knowledge of use of DME, Decreased activity tolerance, Difficulty walking  Visit Diagnosis: Muscle weakness (generalized)  Other lack of coordination  Difficulty in walking, not elsewhere classified     Problem List Patient Active Problem List   Diagnosis Date Noted  . Cognitive deficit, post-stroke 12/31/2017  . Ataxia, post-stroke 12/31/2017  . Dysarthria, post-stroke   . Gait disturbance, post-stroke   . H/O cerebral parenchymal hemorrhage 11/04/2017  . Essential hypertension 11/04/2017  . Hyperlipidemia 11/04/2017  . Diabetes ([place]) 11/04/2017  . CAD (coronary artery disease) 11/04/2017  . 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 Hypokalemia 11/04/2017  . Right-sided nontraumatic intracerebral hemorrhage of cerebellum ([place])   . History of cervical cancer   . History of TIA (transient ischemic attack)   . Acute systolic congestive  heart failure ([place])   . Reactive hypertension   . Hypernatremia   . Leukocytosis   . Acute blood loss anemia   . Elevated serum creatinine   . Acute respiratory failure with hypoxia ([place])   . IVH (intraventricular hemorrhage) ([place]) 10/29/2017  . Hypoxia 10/28/2017  . Pancreatic lesion 05/24/2017  . 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 Nodule of upper lobe of left lung 06/04/2015  . 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 Cerebral thrombosis with cerebral infarction 04/03/2015  . Cancer of right colon ([place]) 03/28/2015  . CVA (cerebral infarction) 12/21/2014  . Polycythemia vera ([place]) 06/22/2006   Harriet Masson, SPT  This entire session was performed under direct supervision and direction of a licensed therapist/therapist assistant . I have personally read, edited and approve of the note as written.  Myles Gip PT, DPT [phone] 03/24/2018, 1:39 PM  [place] MAIN [facility] SERVICES [address] [place], [place], [address] Phone: [phone]   Fax:  [phone]  Name: [name] MRN: [id number] Date of Birth: 06/15/1932

## 2018-03-25 NOTE — Progress Notes (Signed)
I agree with the above plan 

## 2018-03-28 ENCOUNTER — Ambulatory Visit: Payer: Medicare Other | Admitting: Occupational Therapy

## 2018-03-28 ENCOUNTER — Encounter: Payer: Self-pay | Admitting: Occupational Therapy

## 2018-03-28 ENCOUNTER — Ambulatory Visit: Payer: Medicare Other | Admitting: Physical Therapy

## 2018-03-28 ENCOUNTER — Encounter: Payer: Self-pay | Admitting: Physical Therapy

## 2018-03-28 DIAGNOSIS — M6281 Muscle weakness (generalized): Secondary | ICD-10-CM

## 2018-03-28 DIAGNOSIS — R262 Difficulty in walking, not elsewhere classified: Secondary | ICD-10-CM

## 2018-03-28 NOTE — Therapy (Addendum)
[place] MAIN [facility] SERVICES [address] [place], [place], [address] Phone: [phone]   Fax:  [phone]  Occupational Therapy Treatment/Recertification/Occupational Therapy Progress Note  Dates of reporting period  01/20/2018   to   03/28/2018  Patient Details  Name: [name] MRN: [id number] Date of Birth: Aug 31, 1931 No data recorded  Encounter Date: 03/28/2018  OT End of Session - 03/28/18 1152    Visit Number  10    Number of Visits  24    Date for OT Re-Evaluation  06/20/18    Authorization Type  Medicare    Authorization Time Period  visit 10/10, reporting period starting 01/20/2018    OT Start Time  1100    OT Stop Time  1145    OT Time Calculation (min)  45 min    Activity Tolerance  Patient tolerated treatment well    Behavior During Therapy  [facility] for tasks assessed/performed       Past Medical History:  Diagnosis Date  . Adenocarcinoma in situ of cervix   . Arthritis    hands  . Breast cancer ([place])   . Colon cancer ([place])   . Diabetes mellitus without complication ([place])   . Endometrial carcinoma (HCC)    s/p total abdominal hysterectomy  . H/O compression fracture of spine 2014   thoracic spine  . H/O polycythemia vera   . H/O TIA (transient ischemic attack) and stroke 09/2014, 03/2015   No deficits  . Hypertension   . Hyperuricemia   . Microalbuminuria   . Polycythemia vera ([place])   . Recurrent falls   . Skin cancer    face, legs  . Stroke [facility]) 2008   no deficits  . Trochanteric bursitis   . Varicose veins    treated    Past Surgical History:  Procedure Laterality Date  . ABDOMINAL HYSTERECTOMY    . BOWEL RESECTION N/A 03/28/2015   Procedure: SMALL BOWEL RESECTION;  Surgeon: Leonie Green, MD;  Location: [facility] ORS;  Service: General;  Laterality: N/A;  . CATARACT EXTRACTION W/ INTRAOCULAR LENS IMPLANT Right   . CATARACT EXTRACTION W/PHACO Left 10/07/2015   Procedure: CATARACT EXTRACTION PHACO AND  INTRAOCULAR LENS PLACEMENT (IOC);  Surgeon: Ronnell Freshwater, MD;  Location: [place];  Service: Ophthalmology;  Laterality: Left;  DIABETIC - oral meds VISION BLUE  . CORONARY ANGIOGRAPHY N/A 10/29/2017   Procedure: CORONARY ANGIOGRAPHY;  Surgeon: Dionisio David, MD;  Location: [place] CV LAB;  Service: Cardiovascular;  Laterality: N/A;  . EXPLORATORY LAPAROTOMY     for fibroids  . LEFT HEART CATH Right 10/29/2017   Procedure: Left Heart Cath;  Surgeon: Dionisio David, MD;  Location: [place] CV LAB;  Service: Cardiovascular;  Laterality: Right;  . TONSILLECTOMY      There were no vitals filed for this visit.  Subjective Assessment - 03/28/18 1127    Subjective   Pt reports having a good weekend and enjoying her day thus far.    Pertinent History  84 year old female with adenocarcinoma of the cervix as well as endometrial carcinoma and polycythemia vera as well as diabetes who had hypertensive right cerebellar hemorrhage onset 10/28/2017.  She was treated initially at [facility] and then transferred to [facility].  Neurosurgical evaluation per Dr. Ronnald Ramp concluded no surgery was needed.  The patient completed inpatient rehabilitation at [facility] (including SLP) 11/04/2017-11/18/2107 and was discharged at a 24-hour supervision level.  The patient has  hired caregiver to provide 24-hour supervision. Patient has received home health rehab services, including SLP.     Patient Stated Goals  Patient would like to be independent in all tasks and be able to live alone again.    Currently in Pain?  No/denies    Pain Score  0-No pain         [facility] OT Assessment - 03/28/18 1109      Coordination   Right 9 Hole Peg Test  42    Left 9 Hole Peg Test  34      Hand Function   Right Hand Grip (lbs)  19    Right Hand Lateral Pinch  12 lbs    Right Hand 3 Point Pinch  6 lbs    Left Hand Grip (lbs)  19    Left Hand Lateral Pinch  8 lbs    Left 3 point  pinch  8 lbs      OT TREATMENT  Measurements were obtained for grip strength, pinch strength, FMC, and UE strength. Goals were reviewed with the patient.  Therapeutic exercise:  Pt completed grip strengthening task that required her to remove large pegs from a pegboard using the grey gripper with the Gabryella Murfin spring set in second notch from the top and reach in different planes to replace pegs in container. Pt frequently dropped pegs when reaching to replace them into the container. The gripper was reset to the Roosevelt Bisher spring set in the first notch. Pt dropped fewer pegs when the gripper was reset. Pt completed pinch strength task that required her to pinch yellow and red resistive clips using a 3 point pinch x3 before placing them on a dowel and using a lateral pinch x3 when removing them. Pt tolerated yellow 1# and red 4# resistive clips well. Pt noted feeling fatigue in her hands as the task progressed.                 OT Education - 03/28/18 1143    Education Details  POC, goals, grip strengthening, handwriting    Person(s) Educated  Patient    Methods  Explanation;Demonstration;Verbal cues    Comprehension  Verbalized understanding;Returned demonstration;Verbal cues required          OT Long Term Goals - 03/28/18 1412      OT LONG TERM GOAL #1   Title  Patient will complete bathing with modified independence    Baseline  Pt. continues to require ModA    Time  12    Period  Weeks    Status  On-going    Target Date  06/20/18      OT LONG TERM GOAL #2   Title  Patient will complete dressing skills with modified independence    Baseline  moderate assist    Time  12    Period  Weeks    Status  On-going    Target Date  06/20/18      OT LONG TERM GOAL #3   Title  Patient will complete light meal prep with min assist    Baseline  max assist    Time  12    Period  Weeks    Status  On-going    Target Date  06/20/18      OT LONG TERM GOAL #4   Title  Patient will  increase R grip strength by 5# to open jars and containers with modified independence    Baseline  Pt. conitnues to have difficulaty opening  jars, and containers.    Time  12    Period  Weeks    Status  On-going    Target Date  06/20/18      OT LONG TERM GOAL #5   Title  Patient will improve strength by 1 mm grade RUE to assist with obtaining items from the closet.     Baseline  4/5 overall RUE. Pt. conitnues to have difficulty obtaining items from the closet.    Time  8    Period  Weeks    Target Date  06/20/18      OT LONG TERM GOAL #6   Title  Pt will increase handwriting legibility and speed to 50% while writing a 3 sentence paragraph in order to write thank you notes and birthday cards for friends and family    Baseline  03/14/2018:  25% legibility to writie a sentence. 50% legibility for her name    Time  4    Period  Weeks    Status  On-going    Target Date  06/20/18            Plan - 03/28/18 1153    Clinical Impression Statement Measurements were obtained for reassessment. Pt demonstrated increased [place] bilaterally evident by improvement in 9 hole peg test times and increase function at home. Pt demonstrated a slight regression in grip and pinch strength. Pt continues to work to increase R grip and pinch strength. Pt reports being able to do more at home, and has improved with self-feeding, and writing checks.  Pt. still requires more assist during ADLs, and IADLs  than she would like. Pt. continues to work on improving ADL, and IADL tasks to maximize independence.   Occupational Profile and client history currently impacting functional performance  family more than 30 mins away, has 24 hour caregiver assist, decreased awareness, memory and safety    Occupational performance deficits (Please refer to evaluation for details):  ADL's;IADL's;Social Participation    Rehab Potential  Good    Current Impairments/barriers affecting progress:  memory, requires 24 hour caregivers,      OT Frequency  2x / week    OT Duration  12 weeks    OT Treatment/Interventions  Self-care/ADL training;Cryotherapy;Therapeutic exercise;DME and/or AE instruction;Functional Mobility Training;Cognitive remediation/compensation;Balance training;Neuromuscular education;Manual Therapy;Moist Heat;Therapeutic activities;Patient/family education    Clinical Decision Making  Several treatment options, min-mod task modification necessary    Consulted and Agree with Plan of Care  Patient    Family Member Consulted  caregiver       Patient will benefit from skilled therapeutic intervention in order to improve the following deficits and impairments:  Decreased balance, Decreased mobility, Difficulty walking, Decreased cognition, Decreased activity tolerance, Decreased coordination, Decreased safety awareness, Decreased strength, Impaired UE functional use  Visit Diagnosis: Muscle weakness (generalized)    Problem List Patient Active Problem List   Diagnosis Date Noted  . Cognitive deficit, post-stroke 12/31/2017  . Ataxia, post-stroke 12/31/2017  . Dysarthria, post-stroke   . Gait disturbance, post-stroke   . H/O cerebral parenchymal hemorrhage 11/04/2017  . Essential hypertension 11/04/2017  . Hyperlipidemia 11/04/2017  . Diabetes ([place]) 11/04/2017  . CAD (coronary artery disease) 11/04/2017  . 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 Hypokalemia 11/04/2017  . Right-sided nontraumatic intracerebral hemorrhage of cerebellum ([place])   . History of cervical cancer   . History of TIA (transient ischemic attack)   . Acute systolic congestive heart failure ([place])   . Reactive hypertension   . Hypernatremia   .  Leukocytosis   . Acute blood loss anemia   . Elevated serum creatinine   . Acute respiratory failure with hypoxia ([place])   . IVH (intraventricular hemorrhage) ([place]) 10/29/2017  . Hypoxia 10/28/2017  . Pancreatic lesion 05/24/2017  . 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 Nodule of  upper lobe of left lung 06/04/2015  . 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 Cerebral thrombosis with cerebral infarction 04/03/2015  . Cancer of right colon ([place]) 03/28/2015  . CVA (cerebral infarction) 12/21/2014  . Polycythemia vera ([place]) 06/22/2006    Oliver Hum, OTS 03/28/2018, 2:18 PM  This entire session was performed under direct supervision and direction of a licensed therapist/therapist assistant . I have personally read, edited and approve of the note as written.  Harrel Carina, MS, OTR/L  [place] MAIN [facility] SERVICES [address] [place], [place], [address] Phone: [phone]   Fax:  [phone]  Name: [name] MRN: [id number] Date of Birth: 1932/06/17

## 2018-03-28 NOTE — Therapy (Addendum)
[place] MAIN [facility] SERVICES [address] [place], [place], [address] Phone: [phone]   Fax:  [phone]  Physical Therapy Treatment  Patient Details  Name: [name] MRN: [id number] Date of Birth: 1932/05/13 Referring Provider (PT): Charlett Blake   Encounter Date: 03/28/2018  PT End of Session - 03/28/18 1155    Visit Number  8    Number of Visits  25    Date for PT Re-Evaluation  04/25/18    Authorization Type  8/10 with [facility] 01/31/18    PT Start Time  1145    PT Stop Time  1230    PT Time Calculation (min)  45 min    Equipment Utilized During Treatment  Gait belt    Activity Tolerance  Patient tolerated treatment well    Behavior During Therapy  WFL for tasks assessed/performed       Past Medical History:  Diagnosis Date  . Adenocarcinoma in situ of cervix   . Arthritis    hands  . Breast cancer ([place])   . Colon cancer ([place])   . Diabetes mellitus without complication ([place])   . Endometrial carcinoma (HCC)    s/p total abdominal hysterectomy  . H/O compression fracture of spine 2014   thoracic spine  . H/O polycythemia vera   . H/O TIA (transient ischemic attack) and stroke 09/2014, 03/2015   No deficits  . Hypertension   . Hyperuricemia   . Microalbuminuria   . Polycythemia vera ([place])   . Recurrent falls   . Skin cancer    face, legs  . Stroke [facility]) 2008   no deficits  . Trochanteric bursitis   . Varicose veins    treated    Past Surgical History:  Procedure Laterality Date  . ABDOMINAL HYSTERECTOMY    . BOWEL RESECTION N/A 03/28/2015   Procedure: SMALL BOWEL RESECTION;  Surgeon: Leonie Green, MD;  Location: [facility] ORS;  Service: General;  Laterality: N/A;  . CATARACT EXTRACTION W/ INTRAOCULAR LENS IMPLANT Right   . CATARACT EXTRACTION W/PHACO Left 10/07/2015   Procedure: CATARACT EXTRACTION PHACO AND INTRAOCULAR LENS PLACEMENT (IOC);  Surgeon: Ronnell Freshwater, MD;  Location: [place];  Service: Ophthalmology;  Laterality: Left;  DIABETIC - oral meds VISION BLUE  . CORONARY ANGIOGRAPHY N/A 10/29/2017   Procedure: CORONARY ANGIOGRAPHY;  Surgeon: Dionisio David, MD;  Location: [place] CV LAB;  Service: Cardiovascular;  Laterality: N/A;  . EXPLORATORY LAPAROTOMY     for fibroids  . LEFT HEART CATH Right 10/29/2017   Procedure: Left Heart Cath;  Surgeon: Dionisio David, MD;  Location: [place] CV LAB;  Service: Cardiovascular;  Laterality: Right;  . TONSILLECTOMY      There were no vitals filed for this visit.  Subjective Assessment - 03/28/18 1152    Subjective  Patient reports no pain or falls since last visit. Pt's caregiver reports pt complained of soreness in legs following exercise last session; pt reports no pain or soreness now compared to over the weekend.    Pertinent History    84 year old female with adenocarcinoma of the cervix as well as endometrial carcinoma and polycythemia vera as well as diabetes who had hypertensive right cerebellar hemorrhage onset 10/28/2017.  She was treated initially at [facility] and then transferred to [facility].  Neurosurgical evaluation per Dr. Ronnald Ramp concluded no surgery was needed.  The patient completed inpatient rehabilitation at [facility] (including SLP) 11/04/2017-11/18/2107 and  was discharged at a 24-hour supervision level.  The patient has hired caregiver to provide 24-hour supervision. Patient has received home health rehab services, including SLP.    Limitations  Standing;Walking    How long can you walk comfortably?  a few minutes    Patient Stated Goals  Patient wants to walk better and not need the RW.     Currently in Pain?  No/denies       Treatment Warm up on Nustep level 2 x5 min with VCs for maintaining SPM close to 60 for cardiovascular challenge and improve activity tolerance   Gait around gym with SPC  -60 ft with CGA for safety, min A occasionally for unsteadiness with  sequencing -20 ft CGA for safety VCs required for sequencing with SPC and steps, demonstration required to understand proper sequencing and technique   Leg press:  -BLE 60# x10 reps -BLE heel raises 30# 2x15 reps  VCs for sequencing as well as proper foot positioning; VCs for slowing eccentric return for better strengthening and continued encouragement in activity  Hamstring stretch in long sitting x20 sec hold each leg with VCs for proper positioning and sequencing   Sit <> stand practice from mat table x10 reps; VCs for reaching forwards when coming to stand and preparing with rocking prior to stand, tactile cuing at hip and guiding with PT's hip to slow descent into sitting; required increased time and rest breaks between repetitions secondary to fatigue   Pt verbalized difficulty with sit <> stands and felt LE strength challenge; excited about continuing to work on sit <> stands without UE support                PT Education - 03/28/18 1154    Education Details  exercise form/technique, gait safety    Person(s) Educated  Patient    Methods  Explanation;Demonstration;Verbal cues    Comprehension  Verbalized understanding;Returned demonstration;Verbal cues required;Need further instruction       PT Short Term Goals - 03/24/18 1328      PT SHORT TERM GOAL #1   Title  Patient will reduce timed up and go to <11 seconds to reduce fall risk and demonstrate improved transfer/gait ability.    Baseline  22.07 sec; 03/24/18: 14.2sec;     Time  2    Period  Weeks    Status  On-going    Target Date  04/07/18      PT SHORT TERM GOAL #2   Title  Patient will be independent with ascend/descend 2 steps using single UE in step over step pattern without LOB.    Baseline  03/24/18: CGA, single UE support, step-to pattern without LOB;     Time  2    Period  Weeks    Status  On-going    Target Date  04/07/18        PT Long Term Goals - 03/24/18 1325      PT LONG TERM GOAL #1    Title  Patient will be independent in home exercise program to improve strength/mobility for better functional independence with ADLs.    Time  12    Period  Weeks    Status  On-going    Target Date  04/25/18      PT LONG TERM GOAL #2   Title  Patient (> 75 years old) will complete five times sit to stand test in < 15 seconds indicating an increased LE strength and improved balance    Baseline  34.21 sec; 03/24/2018: 22.9 sec    Time  12    Period  Weeks    Status  On-going    Target Date  04/25/18      PT LONG TERM GOAL #3   Title  Patient will increase six minute walk test distance to >1000 for progression to community ambulator and improve gait ability    Baseline  550 feet; 03/24/18: 795 ft.;     Time  12    Period  Weeks    Status  On-going    Target Date  04/25/18      PT LONG TERM GOAL #4   Title  Patient will increase 10 meter walk test to >1.39m/s as to improve gait speed for better community ambulation and to reduce fall risk    Baseline  .49 m/sec; 03/24/18: 1.01 m/sec;    Time  12    Period  Weeks    Status  Achieved    Target Date  04/25/18            Plan - 03/28/18 1255    Clinical Impression Statement  Patient tolerated therapy session well. Pt motivated and excited to try ambulation with SPC around gym; wants to decrease dependence on RW when possible at home. Pt demonstrated decreased gait speed with ambulation with SPC but improved sequencing with repetitions and VCs; required CGA for safety and VCs to remember proper sequencing and not go too fast. Pt demonstrated difficulty performing sit <> stands with no UE support from mat table; required min A and VCs for sequencing and technique for increased forward trunk lean. Pt will continue to benefit from skilled PT intervention for improvements in balance, strength, and gait safety.     Rehab Potential  Fair    PT Frequency  2x / week    PT Duration  12 weeks    PT Treatment/Interventions  Patient/family  education;Neuromuscular re-education;Balance training;Stair training;Therapeutic activities;Therapeutic exercise;Gait training;Aquatic Therapy    PT Next Visit Plan  balance and strengthening    Consulted and Agree with Plan of Care  Patient;Family member/caregiver       Patient will benefit from skilled therapeutic intervention in order to improve the following deficits and impairments:  Abnormal gait, Decreased balance, Decreased endurance, Decreased mobility, Impaired flexibility, Decreased strength, Decreased knowledge of use of DME, Decreased activity tolerance, Difficulty walking  Visit Diagnosis: Muscle weakness (generalized)  Difficulty in walking, not elsewhere classified     Problem List Patient Active Problem List   Diagnosis Date Noted  . Cognitive deficit, post-stroke 12/31/2017  . Ataxia, post-stroke 12/31/2017  . Dysarthria, post-stroke   . Gait disturbance, post-stroke   . H/O cerebral parenchymal hemorrhage 11/04/2017  . Essential hypertension 11/04/2017  . Hyperlipidemia 11/04/2017  . Diabetes ([place]) 11/04/2017  . CAD (coronary artery disease) 11/04/2017  . 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 Hypokalemia 11/04/2017  . Right-sided nontraumatic intracerebral hemorrhage of cerebellum ([place])   . History of cervical cancer   . History of TIA (transient ischemic attack)   . Acute systolic congestive heart failure ([place])   . Reactive hypertension   . Hypernatremia   . Leukocytosis   . Acute blood loss anemia   . Elevated serum creatinine   . Acute respiratory failure with hypoxia ([place])   . IVH (intraventricular hemorrhage) ([place]) 10/29/2017  . Hypoxia 10/28/2017  . Pancreatic lesion 05/24/2017  . 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 Nodule of upper lobe of left lung 06/04/2015  .  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 Cerebral thrombosis with cerebral infarction 04/03/2015  . Cancer of right colon ([place]) 03/28/2015  . CVA  (cerebral infarction) 12/21/2014  . Polycythemia vera ([place]) 06/22/2006   Harriet Masson, SPT This entire session was performed under direct supervision and direction of a licensed therapist/therapist assistant . I have personally read, edited and approve of the note as written.  Trotter,Margaret PT, DPT 03/28/2018, 1:34 PM  [place] MAIN [facility] SERVICES [address] [place], [place], [address] Phone: [phone]   Fax:  [phone]  Name: LATEEFA CROSBY MRN: [id number] Date of Birth: 30-Jul-1931

## 2018-03-30 ENCOUNTER — Ambulatory Visit: Payer: Medicare Other | Admitting: Physical Therapy

## 2018-04-01 ENCOUNTER — Ambulatory Visit: Payer: Medicare Other

## 2018-04-01 DIAGNOSIS — M6281 Muscle weakness (generalized): Secondary | ICD-10-CM | POA: Diagnosis not present

## 2018-04-01 DIAGNOSIS — R262 Difficulty in walking, not elsewhere classified: Secondary | ICD-10-CM

## 2018-04-01 NOTE — Therapy (Signed)
[place] MAIN [facility] SERVICES [address] [place], [place], [address] Phone: [phone]   Fax:  [phone]  Physical Therapy Treatment  Patient Details  Name: [name] MRN: [id number] Date of Birth: [birth year] Referring Provider (PT): Charlett Blake   Encounter Date: 04/01/2018  PT End of Session - 04/01/18 1007    Visit Number  9    Number of Visits  25    Date for PT Re-Evaluation  04/25/18    Authorization Type  2/10, last goals 03/24/18, start of care 01/31/18    PT Start Time  1008    PT Stop Time  1045    PT Time Calculation (min)  37 min    Equipment Utilized During Treatment  Gait belt    Activity Tolerance  Patient tolerated treatment well    Behavior During Therapy  WFL for tasks assessed/performed       Past Medical History:  Diagnosis Date  . Adenocarcinoma in situ of cervix   . Arthritis    hands  . Breast cancer ([place])   . Colon cancer ([place])   . Diabetes mellitus without complication ([place])   . Endometrial carcinoma (HCC)    s/p total abdominal hysterectomy  . H/O compression fracture of spine 2014   thoracic spine  . H/O polycythemia vera   . H/O TIA (transient ischemic attack) and stroke 09/2014, 03/2015   No deficits  . Hypertension   . Hyperuricemia   . Microalbuminuria   . Polycythemia vera ([place])   . Recurrent falls   . Skin cancer    face, legs  . Stroke [facility]) 2008   no deficits  . Trochanteric bursitis   . Varicose veins    treated    Past Surgical History:  Procedure Laterality Date  . ABDOMINAL HYSTERECTOMY    . BOWEL RESECTION N/A 03/28/2015   Procedure: SMALL BOWEL RESECTION;  Surgeon: Leonie Green, MD;  Location: [facility] ORS;  Service: General;  Laterality: N/A;  . CATARACT EXTRACTION W/ INTRAOCULAR LENS IMPLANT Right   . CATARACT EXTRACTION W/PHACO Left 10/07/2015   Procedure: CATARACT EXTRACTION PHACO AND INTRAOCULAR LENS PLACEMENT (IOC);  Surgeon: Ronnell Freshwater,  MD;  Location: [place];  Service: Ophthalmology;  Laterality: Left;  DIABETIC - oral meds VISION BLUE  . CORONARY ANGIOGRAPHY N/A 10/29/2017   Procedure: CORONARY ANGIOGRAPHY;  Surgeon: Dionisio David, MD;  Location: [place] CV LAB;  Service: Cardiovascular;  Laterality: N/A;  . EXPLORATORY LAPAROTOMY     for fibroids  . LEFT HEART CATH Right 10/29/2017   Procedure: Left Heart Cath;  Surgeon: Dionisio David, MD;  Location: [place] CV LAB;  Service: Cardiovascular;  Laterality: Right;  . TONSILLECTOMY      There were no vitals filed for this visit.  Subjective Assessment - 04/01/18 1007    Subjective  Patient reports no pain or falls since last visit. No specific questions from patient or caregiver.    Pertinent History    84 year old female with adenocarcinoma of the cervix as well as endometrial carcinoma and polycythemia vera as well as diabetes who had hypertensive right cerebellar hemorrhage onset 10/28/2017.  She was treated initially at [facility] and then transferred to [facility].  Neurosurgical evaluation per Dr. Ronnald Ramp concluded no surgery was needed.  The patient completed inpatient rehabilitation at [facility] (including SLP) 11/04/2017-11/18/2107 and was discharged at a 24-hour supervision level.  The patient has hired caregiver to  provide 24-hour supervision. Patient has received home health rehab services, including SLP.    Limitations  Standing;Walking    How long can you walk comfortably?  a few minutes    Patient Stated Goals  Patient wants to walk better and not need the RW.     Currently in Pain?  No/denies        TREATMENT  Ther-ex  NuStep L1 x 5 minutes for warm-up during history (3 minutes unbilled); Omega bilateral LE leg press 60# x 20 reps, 75# x 20 Omega bilateral heel raises 45# 2 x 20 reps  Sit <> stand practice from mat table 2 x 10 reps; VCs for reaching forwards when coming to stand and preparing with rocking prior  to stand, tactile cuing at hip and guiding with PT's hip to slow descent into sitting; required increased time and rest breaks between repetitions secondary to fatigue ;  Neuromuscular Re-education  Gait in hallway with horizontal and vertical head turns using single point cane with cues for proper sequencing; Toe taps to 2" Airex pad without UE support alternating LE x 10 each;   Pt educated throughout session about proper posture and technique with exercises. Improved exercise technique, movement at target joints, use of target muscles after min to mod verbal, visual, tactile cues.    Pt arrived late to appointment so session abbreviated. Patient demonstrates excellent motivation during session. Pt demonstrates instability with horizontal and vertical head turns during ambulation with single point cane. She is able to increase her repetitions with leg press today. She requires repeated cues for proper technique with sit to stand. Pt encouraged to continue HEP and follow-up as scheduled. Pt will continue to benefit from skilled PT intervention for improvements in balance, strength, and gait safety.                       PT Short Term Goals - 03/24/18 1328      PT SHORT TERM GOAL #1   Title  Patient will reduce timed up and go to <11 seconds to reduce fall risk and demonstrate improved transfer/gait ability.    Baseline  22.07 sec; 03/24/18: 14.2sec;     Time  2    Period  Weeks    Status  On-going    Target Date  04/07/18      PT SHORT TERM GOAL #2   Title  Patient will be independent with ascend/descend 2 steps using single UE in step over step pattern without LOB.    Baseline  03/24/18: CGA, single UE support, step-to pattern without LOB;     Time  2    Period  Weeks    Status  On-going    Target Date  04/07/18        PT Long Term Goals - 03/24/18 1325      PT LONG TERM GOAL #1   Title  Patient will be independent in home exercise program to improve  strength/mobility for better functional independence with ADLs.    Time  12    Period  Weeks    Status  On-going    Target Date  04/25/18      PT LONG TERM GOAL #2   Title  Patient (> 65 years old) will complete five times sit to stand test in < 15 seconds indicating an increased LE strength and improved balance    Baseline  34.21 sec; 03/24/2018: 22.9 sec    Time  12  Period  Weeks    Status  On-going    Target Date  04/25/18      PT LONG TERM GOAL #3   Title  Patient will increase six minute walk test distance to >1000 for progression to community ambulator and improve gait ability    Baseline  550 feet; 03/24/18: 795 ft.;     Time  12    Period  Weeks    Status  On-going    Target Date  04/25/18      PT LONG TERM GOAL #4   Title  Patient will increase 10 meter walk test to >1.65m/s as to improve gait speed for better community ambulation and to reduce fall risk    Baseline  .49 m/sec; 03/24/18: 1.01 m/sec;    Time  12    Period  Weeks    Status  Achieved    Target Date  04/25/18            Plan - 04/01/18 1011    Clinical Impression Statement  Pt arrived late to appointment so session abbreviated. Patient demonstrates excellent motivation during session. Pt demonstrates instability with horizontal and vertical head turns during ambulation with single point cane. She is able to increase her repetitions with leg press today. She requires repeated cues for proper technique with sit to stand. Pt encouraged to continue HEP and follow-up as scheduled. Pt will continue to benefit from skilled PT intervention for improvements in balance, strength, and gait safety.    Rehab Potential  Fair    PT Frequency  2x / week    PT Duration  12 weeks    PT Treatment/Interventions  Patient/family education;Neuromuscular re-education;Balance training;Stair training;Therapeutic activities;Therapeutic exercise;Gait training;Aquatic Therapy    PT Next Visit Plan  balance and strengthening     Consulted and Agree with Plan of Care  Patient;Family member/caregiver       Patient will benefit from skilled therapeutic intervention in order to improve the following deficits and impairments:  Abnormal gait, Decreased balance, Decreased endurance, Decreased mobility, Impaired flexibility, Decreased strength, Decreased knowledge of use of DME, Decreased activity tolerance, Difficulty walking  Visit Diagnosis: Muscle weakness (generalized)  Difficulty in walking, not elsewhere classified     Problem List Patient Active Problem List   Diagnosis Date Noted  . Cognitive deficit, post-stroke 12/31/2017  . Ataxia, post-stroke 12/31/2017  . Dysarthria, post-stroke   . Gait disturbance, post-stroke   . H/O cerebral parenchymal hemorrhage 11/04/2017  . Essential hypertension 11/04/2017  . Hyperlipidemia 11/04/2017  . Diabetes ([place]) 11/04/2017  . CAD (coronary artery disease) 11/04/2017  . 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 Hypokalemia 11/04/2017  . Right-sided nontraumatic intracerebral hemorrhage of cerebellum ([place])   . History of cervical cancer   . History of TIA (transient ischemic attack)   . Acute systolic congestive heart failure ([place])   . Reactive hypertension   . Hypernatremia   . Leukocytosis   . Acute blood loss anemia   . Elevated serum creatinine   . Acute respiratory failure with hypoxia ([place])   . IVH (intraventricular hemorrhage) ([place]) 10/29/2017  . Hypoxia 10/28/2017  . Pancreatic lesion 05/24/2017  . 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 Nodule of upper lobe of left lung 06/04/2015  . 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 Cerebral thrombosis with cerebral infarction 04/03/2015  . Cancer of right colon ([place]) 03/28/2015  . CVA (cerebral infarction) 12/21/2014  . Polycythemia vera ([place]) 06/22/2006   Corene Cornea  D Kinzey Sheriff PT, DPT, GCS  Treavon Castilleja 04/01/2018, 8:15 PM  [place] MAIN  [facility] SERVICES [address] [place], [place], [address] Phone: [phone]   Fax:  [phone]  Name: MEHR DEPAOLI MRN: [id number] Date of Birth: 11-10-1931

## 2018-04-04 ENCOUNTER — Encounter: Payer: Self-pay | Admitting: Physical Therapy

## 2018-04-04 ENCOUNTER — Encounter: Payer: Medicare Other | Admitting: Occupational Therapy

## 2018-04-04 ENCOUNTER — Ambulatory Visit: Payer: Medicare Other | Admitting: Physical Therapy

## 2018-04-04 DIAGNOSIS — M6281 Muscle weakness (generalized): Secondary | ICD-10-CM

## 2018-04-04 DIAGNOSIS — R262 Difficulty in walking, not elsewhere classified: Secondary | ICD-10-CM

## 2018-04-04 NOTE — Therapy (Addendum)
[place] MAIN [facility] SERVICES [address] [place], [place], [address] Phone: [phone]   Fax:  [phone]  Physical Therapy Treatment  Patient Details  Name: [name] MRN: [id number] Date of Birth: 1932/02/19 Referring Provider (PT): Charlett Blake   Encounter Date: 04/04/2018  PT End of Session - 04/04/18 1109    Visit Number  10    Number of Visits  25    Date for PT Re-Evaluation  04/25/18    Authorization Type  3/10, last goals 03/24/18, start of care 01/31/18    PT Start Time  1102    PT Stop Time  1145    PT Time Calculation (min)  43 min    Equipment Utilized During Treatment  Gait belt    Activity Tolerance  Patient tolerated treatment well    Behavior During Therapy  WFL for tasks assessed/performed       Past Medical History:  Diagnosis Date  . Adenocarcinoma in situ of cervix   . Arthritis    hands  . Breast cancer ([place])   . Colon cancer ([place])   . Diabetes mellitus without complication ([place])   . Endometrial carcinoma (HCC)    s/p total abdominal hysterectomy  . H/O compression fracture of spine 2014   thoracic spine  . H/O polycythemia vera   . H/O TIA (transient ischemic attack) and stroke 09/2014, 03/2015   No deficits  . Hypertension   . Hyperuricemia   . Microalbuminuria   . Polycythemia vera ([place])   . Recurrent falls   . Skin cancer    face, legs  . Stroke [facility]) 2008   no deficits  . Trochanteric bursitis   . Varicose veins    treated    Past Surgical History:  Procedure Laterality Date  . ABDOMINAL HYSTERECTOMY    . BOWEL RESECTION N/A 03/28/2015   Procedure: SMALL BOWEL RESECTION;  Surgeon: Leonie Green, MD;  Location: [facility] ORS;  Service: General;  Laterality: N/A;  . CATARACT EXTRACTION W/ INTRAOCULAR LENS IMPLANT Right   . CATARACT EXTRACTION W/PHACO Left 10/07/2015   Procedure: CATARACT EXTRACTION PHACO AND INTRAOCULAR LENS PLACEMENT (IOC);  Surgeon: Ronnell Freshwater,  MD;  Location: [place];  Service: Ophthalmology;  Laterality: Left;  DIABETIC - oral meds VISION BLUE  . CORONARY ANGIOGRAPHY N/A 10/29/2017   Procedure: CORONARY ANGIOGRAPHY;  Surgeon: Dionisio David, MD;  Location: [place] CV LAB;  Service: Cardiovascular;  Laterality: N/A;  . EXPLORATORY LAPAROTOMY     for fibroids  . LEFT HEART CATH Right 10/29/2017   Procedure: Left Heart Cath;  Surgeon: Dionisio David, MD;  Location: [place] CV LAB;  Service: Cardiovascular;  Laterality: Right;  . TONSILLECTOMY      There were no vitals filed for this visit.  Subjective Assessment - 04/04/18 1104    Subjective  Patient reports she is doing well today; no pain or falls since last visit.     Pertinent History    84 year old female with adenocarcinoma of the cervix as well as endometrial carcinoma and polycythemia vera as well as diabetes who had hypertensive right cerebellar hemorrhage onset 10/28/2017.  She was treated initially at [facility] and then transferred to [facility].  Neurosurgical evaluation per Dr. Ronnald Ramp concluded no surgery was needed.  The patient completed inpatient rehabilitation at [facility] (including SLP) 11/04/2017-11/18/2107 and was discharged at a 24-hour supervision level.  The patient has hired caregiver to provide  24-hour supervision. Patient has received home health rehab services, including SLP.    Limitations  Standing;Walking    How long can you walk comfortably?  a few minutes    Patient Stated Goals  Patient wants to walk better and not need the RW.     Currently in Pain?  No/denies         Treatment Warm up on Nustep level 2 x6 min with VCs for maintaining SPM close to 60 for cardiovascular challenge and improve activity tolerance  Sit <> stand practice from mat table x10 reps; VCs for reaching forwards when coming to stand and preparing with rocking prior to stand, tactile cuing at hip and guiding with PT's hip to slow  descent into sitting; required increased time and rest breaks between repetitions secondary to fatigue;  Bilateral LE leg press 60# x 10 reps, x15 reps Bilateral heel raises 45# 2 x10 reps  -VCs for slowing eccentric return as well as proper foot positioning  Balloon passes, standing on airex pad 2x20 passes, CGA-supervision for safety, VCs for utilizing both UEs to reach outside BOS for the balloon  Gait in hallway with SPC, x30 ft with CGA for safety, max VCs required for sequencing SPC with stepping; attempted gait with [facility] but pt struggled with placing cane too far ahead and not getting all feet down on the ground- discontinued and walked with [facility]       PT Education - 04/04/18 1109    Education Details  exercise form/technique, gait safety     Person(s) Educated  Patient    Methods  Explanation;Demonstration;Verbal cues    Comprehension  Returned demonstration;Verbal cues required;Verbalized understanding;Need further instruction       PT Short Term Goals - 03/24/18 1328      PT SHORT TERM GOAL #1   Title  Patient will reduce timed up and go to <11 seconds to reduce fall risk and demonstrate improved transfer/gait ability.    Baseline  22.07 sec; 03/24/18: 14.2sec;     Time  2    Period  Weeks    Status  On-going    Target Date  04/07/18      PT SHORT TERM GOAL #2   Title  Patient will be independent with ascend/descend 2 steps using single UE in step over step pattern without LOB.    Baseline  03/24/18: CGA, single UE support, step-to pattern without LOB;     Time  2    Period  Weeks    Status  On-going    Target Date  04/07/18        PT Long Term Goals - 03/24/18 1325      PT LONG TERM GOAL #1   Title  Patient will be independent in home exercise program to improve strength/mobility for better functional independence with ADLs.    Time  12    Period  Weeks    Status  On-going    Target Date  04/25/18      PT LONG TERM GOAL #2   Title  Patient (> 59 years  old) will complete five times sit to stand test in < 15 seconds indicating an increased LE strength and improved balance    Baseline  34.21 sec; 03/24/2018: 22.9 sec    Time  12    Period  Weeks    Status  On-going    Target Date  04/25/18      PT LONG TERM GOAL #3   Title  Patient will increase six minute walk test distance to >1000 for progression to community ambulator and improve gait ability    Baseline  550 feet; 03/24/18: 795 ft.;     Time  12    Period  Weeks    Status  On-going    Target Date  04/25/18      PT LONG TERM GOAL #4   Title  Patient will increase 10 meter walk test to >1.65m/s as to improve gait speed for better community ambulation and to reduce fall risk    Baseline  .49 m/sec; 03/24/18: 1.01 m/sec;    Time  12    Period  Weeks    Status  Achieved    Target Date  04/25/18            Plan - 04/04/18 1212    Clinical Impression Statement  Patient tolerated therapy session well. Patient performed NuStep with increased SPM to near 60 for better cardiovascular challenge; required VCs for continued engagement in task. Pt continues to require CGA-min A for sit <> stand without UE support; demonstrated increased fatigue and requires VCs for proper technique. Pt verbalized LE soreness with leg press increase repetitions but no pain. Pt attempted ambulation with WBQC at pt request but was unsafe due to inability to get all 4 feet down prior to taking step. Pt successful with ambulating with SPC but required max VCs for sequencing that faded to mod VCs with repetition. Pt will continue to benefit from skilled PT intervention for improvements in balance, strength, and gait safety.     Rehab Potential  Fair    PT Frequency  2x / week    PT Duration  12 weeks    PT Treatment/Interventions  Patient/family education;Neuromuscular re-education;Balance training;Stair training;Therapeutic activities;Therapeutic exercise;Gait training;Aquatic Therapy    PT Next Visit Plan  balance  and strengthening    Consulted and Agree with Plan of Care  Patient;Family member/caregiver       Patient will benefit from skilled therapeutic intervention in order to improve the following deficits and impairments:  Abnormal gait, Decreased balance, Decreased endurance, Decreased mobility, Impaired flexibility, Decreased strength, Decreased knowledge of use of DME, Decreased activity tolerance, Difficulty walking  Visit Diagnosis: Muscle weakness (generalized)  Difficulty in walking, not elsewhere classified     Problem List Patient Active Problem List   Diagnosis Date Noted  . Cognitive deficit, post-stroke 12/31/2017  . Ataxia, post-stroke 12/31/2017  . Dysarthria, post-stroke   . Gait disturbance, post-stroke   . H/O cerebral parenchymal hemorrhage 11/04/2017  . Essential hypertension 11/04/2017  . Hyperlipidemia 11/04/2017  . Diabetes ([place]) 11/04/2017  . CAD (coronary artery disease) 11/04/2017  . 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 Hypokalemia 11/04/2017  . Right-sided nontraumatic intracerebral hemorrhage of cerebellum ([place])   . History of cervical cancer   . History of TIA (transient ischemic attack)   . Acute systolic congestive heart failure ([place])   . Reactive hypertension   . Hypernatremia   . Leukocytosis   . Acute blood loss anemia   . Elevated serum creatinine   . Acute respiratory failure with hypoxia ([place])   . IVH (intraventricular hemorrhage) ([place]) 10/29/2017  . Hypoxia 10/28/2017  . Pancreatic lesion 05/24/2017  . 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 Nodule of upper lobe of left lung 06/04/2015  . 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 Cerebral thrombosis with cerebral infarction 04/03/2015  . Cancer of right colon ([place]) 03/28/2015  .  CVA (cerebral infarction) 12/21/2014  . Polycythemia vera ([place]) 06/22/2006   Harriet Masson, SPT  This entire session was performed under direct supervision and direction of a  licensed therapist/therapist assistant . I have personally read, edited and approve of the note as written.  Myles Gip PT, DPT [phone] 04/04/2018, 12:17 PM  [place] MAIN [facility] SERVICES [address] [place], [place], [address] Phone: [phone]   Fax:  [phone]  Name: [name] MRN: [id number] Date of Birth: 1932/06/14

## 2018-04-06 ENCOUNTER — Ambulatory Visit: Payer: Medicare Other | Admitting: Physical Therapy

## 2018-04-06 ENCOUNTER — Encounter: Payer: Medicare Other | Admitting: Occupational Therapy

## 2018-04-07 ENCOUNTER — Ambulatory Visit: Payer: Medicare Other | Admitting: Occupational Therapy

## 2018-04-07 ENCOUNTER — Encounter: Payer: Self-pay | Admitting: Occupational Therapy

## 2018-04-07 DIAGNOSIS — M6281 Muscle weakness (generalized): Secondary | ICD-10-CM | POA: Diagnosis not present

## 2018-04-07 DIAGNOSIS — R278 Other lack of coordination: Secondary | ICD-10-CM

## 2018-04-07 NOTE — Therapy (Signed)
[place] MAIN [facility] SERVICES [address] [place], [place], [address] Phone: [phone]   Fax:  [phone]  Occupational Therapy Treatment  Patient Details  Name: [name] MRN: [id number] Date of Birth: [birth year] No data recorded  Encounter Date: 04/07/2018  OT End of Session - 04/07/18 1155    Visit Number  11    Number of Visits  24    Date for OT Re-Evaluation  06/20/18    Authorization Time Period  visit 1/10 for reporting period starting 04/07/2018    OT Start Time  1145    OT Stop Time  1230    OT Time Calculation (min)  45 min    Activity Tolerance  Patient tolerated treatment well    Behavior During Therapy  [facility] for tasks assessed/performed       Past Medical History:  Diagnosis Date  . Adenocarcinoma in situ of cervix   . Arthritis    hands  . Breast cancer ([place])   . Colon cancer ([place])   . Diabetes mellitus without complication ([place])   . Endometrial carcinoma (HCC)    s/p total abdominal hysterectomy  . H/O compression fracture of spine 2014   thoracic spine  . H/O polycythemia vera   . H/O TIA (transient ischemic attack) and stroke 09/2014, 03/2015   No deficits  . Hypertension   . Hyperuricemia   . Microalbuminuria   . Polycythemia vera ([place])   . Recurrent falls   . Skin cancer    face, legs  . Stroke [facility]) 2008   no deficits  . Trochanteric bursitis   . Varicose veins    treated    Past Surgical History:  Procedure Laterality Date  . ABDOMINAL HYSTERECTOMY    . BOWEL RESECTION N/A 03/28/2015   Procedure: SMALL BOWEL RESECTION;  Surgeon: Leonie Green, MD;  Location: [facility] ORS;  Service: General;  Laterality: N/A;  . CATARACT EXTRACTION W/ INTRAOCULAR LENS IMPLANT Right   . CATARACT EXTRACTION W/PHACO Left 10/07/2015   Procedure: CATARACT EXTRACTION PHACO AND INTRAOCULAR LENS PLACEMENT (IOC);  Surgeon: Ronnell Freshwater, MD;  Location: [place];  Service: Ophthalmology;   Laterality: Left;  DIABETIC - oral meds VISION BLUE  . CORONARY ANGIOGRAPHY N/A 10/29/2017   Procedure: CORONARY ANGIOGRAPHY;  Surgeon: Dionisio David, MD;  Location: [place] CV LAB;  Service: Cardiovascular;  Laterality: N/A;  . EXPLORATORY LAPAROTOMY     for fibroids  . LEFT HEART CATH Right 10/29/2017   Procedure: Left Heart Cath;  Surgeon: Dionisio David, MD;  Location: [place] CV LAB;  Service: Cardiovascular;  Laterality: Right;  . TONSILLECTOMY      There were no vitals filed for this visit.  Subjective Assessment - 04/07/18 1152    Subjective   Pt reports the she would like to continue working on writing skills.     Pertinent History  84 year old female with adenocarcinoma of the cervix as well as endometrial carcinoma and polycythemia vera as well as diabetes who had hypertensive right cerebellar hemorrhage onset 10/28/2017.  She was treated initially at [facility] and then transferred to [facility].  Neurosurgical evaluation per Dr. Ronnald Ramp concluded no surgery was needed.  The patient completed inpatient rehabilitation at [facility] (including SLP) 11/04/2017-11/18/2107 and was discharged at a 24-hour supervision level.  The patient has hired caregiver to provide 24-hour supervision. Patient has received home health rehab services, including SLP.     Patient Stated  Goals  Patient would like to be independent in all tasks and be able to live alone again.    Currently in Pain?  No/denies       OT TREATMENT    Neuro muscular re-education:  Pt. Worked on [facility] skills grasping, manipulating, storing, separating, and moving 1/2" magnetic pegs from her palm to the tip of her 2nd digit, and thumb. Pt. Worked on these components of movement in order to manipulate ADL, and IADL items, and writing utensils more efficiently   Selfcare:  Pt. worked on Media planner. Pt. initially used a standard pen, and switched to a pen with a tripod grip. Pt. worked on  Estate agent, and listing events on an event schedule. Pt. worked on Estate agent tasks. Pt.'s writing was initially more legible with decreasing legibility as the writing progressed. No deviation from the written line using unlined paper. Pt. requires increased time to complete.                          OT Education - 04/07/18 1155    Education Details  POC, goals, grip strengthening, handwriting    Person(s) Educated  Patient    Methods  Explanation;Demonstration;Verbal cues    Comprehension  Verbalized understanding;Returned demonstration;Verbal cues required          OT Long Term Goals - 03/28/18 1412      OT LONG TERM GOAL #1   Title  Patient will complete bathing with modified independence    Baseline  Pt. continues to require ModA    Time  12    Period  Weeks    Status  On-going    Target Date  06/20/18      OT LONG TERM GOAL #2   Title  Patient will complete dressing skills with modified independence    Baseline  moderate assist    Time  12    Period  Weeks    Status  On-going    Target Date  06/20/18      OT LONG TERM GOAL #3   Title  Patient will complete light meal prep with min assist    Baseline  max assist    Time  12    Period  Weeks    Status  On-going    Target Date  06/20/18      OT LONG TERM GOAL #4   Title  Patient will increase R grip strength by 5# to open jars and containers with modified independence    Baseline  Pt. conitnues to have difficulaty opening jars, and containers.    Time  12    Period  Weeks    Status  On-going    Target Date  06/20/18      OT LONG TERM GOAL #5   Title  Patient will improve strength by 1 mm grade RUE to assist with obtaining items from the closet.     Baseline  4/5 overall RUE. Pt. conitnues to have difficulty obtaining items from the closet.    Time  8    Period  Weeks    Target Date  06/20/18      OT LONG TERM GOAL #6   Title  Pt will increase handwriting legibility and speed to 50% while  writing a 3 sentence paragraph in order to write thank you notes and birthday cards for friends and family    Baseline  03/14/2018:  25% legibility to writie a sentence. 50% legibility for  her name    Time  4    Period  Weeks    Status  On-going    Target Date  06/20/18            Plan - 04/07/18 1203    Clinical Impression Statement  Pt. reports that she is trying to write more at home. Pt. continues to work on improving  prehension skills, and movement patterns needed for [facility] skills, and writing tasks. Pt. continues to work on improving writing legibility, and speed as well as [facility] skills needed for ADL, and IADL functioning.     Occupational Profile and client history currently impacting functional performance  family more than 30 mins away, has 24 hour caregiver assist, decreased awareness, memory and safety    Occupational performance deficits (Please refer to evaluation for details):  ADL's;IADL's;Social Participation    Rehab Potential  Good    Current Impairments/barriers affecting progress:  memory, requires 24 hour caregivers,     OT Frequency  2x / week    OT Duration  12 weeks    OT Treatment/Interventions  Self-care/ADL training;Cryotherapy;Therapeutic exercise;DME and/or AE instruction;Functional Mobility Training;Cognitive remediation/compensation;Balance training;Neuromuscular education;Manual Therapy;Moist Heat;Therapeutic activities;Patient/family education    Clinical Decision Making  Several treatment options, min-mod task modification necessary    Consulted and Agree with Plan of Care  Patient       Patient will benefit from skilled therapeutic intervention in order to improve the following deficits and impairments:  Decreased balance, Decreased mobility, Difficulty walking, Decreased cognition, Decreased activity tolerance, Decreased coordination, Decreased safety awareness, Decreased strength, Impaired UE functional use  Visit Diagnosis: Other lack of  coordination    Problem List Patient Active Problem List   Diagnosis Date Noted  . Cognitive deficit, post-stroke 12/31/2017  . Ataxia, post-stroke 12/31/2017  . Dysarthria, post-stroke   . Gait disturbance, post-stroke   . H/O cerebral parenchymal hemorrhage 11/04/2017  . Essential hypertension 11/04/2017  . Hyperlipidemia 11/04/2017  . Diabetes ([place]) 11/04/2017  . CAD (coronary artery disease) 11/04/2017  . 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 Hypokalemia 11/04/2017  . Right-sided nontraumatic intracerebral hemorrhage of cerebellum ([place])   . History of cervical cancer   . History of TIA (transient ischemic attack)   . Acute systolic congestive heart failure ([place])   . Reactive hypertension   . Hypernatremia   . Leukocytosis   . Acute blood loss anemia   . Elevated serum creatinine   . Acute respiratory failure with hypoxia ([place])   . IVH (intraventricular hemorrhage) ([place]) 10/29/2017  . Hypoxia 10/28/2017  . Pancreatic lesion 05/24/2017  . 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 Nodule of upper lobe of left lung 06/04/2015  . 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 Cerebral thrombosis with cerebral infarction 04/03/2015  . Cancer of right colon ([place]) 03/28/2015  . CVA (cerebral infarction) 12/21/2014  . Polycythemia vera ([place]) 06/22/2006    Harrel Carina, MS, OTR/L 04/07/2018, 12:11 PM  [place] MAIN [facility] SERVICES [address] [place], [place], [address] Phone: [phone]   Fax:  [phone]  Name: JASALYN FRYSINGER MRN: [id number] Date of Birth: [birth year]

## 2018-04-08 ENCOUNTER — Ambulatory Visit: Payer: Medicare Other

## 2018-04-08 ENCOUNTER — Ambulatory Visit: Payer: Medicare Other | Admitting: Physical Medicine & Rehabilitation

## 2018-04-11 ENCOUNTER — Encounter: Payer: Self-pay | Admitting: Occupational Therapy

## 2018-04-11 ENCOUNTER — Ambulatory Visit: Payer: Medicare Other | Admitting: Occupational Therapy

## 2018-04-11 ENCOUNTER — Ambulatory Visit: Payer: Medicare Other | Admitting: Physical Therapy

## 2018-04-11 ENCOUNTER — Encounter: Payer: Self-pay | Admitting: Physical Therapy

## 2018-04-11 DIAGNOSIS — M6281 Muscle weakness (generalized): Secondary | ICD-10-CM | POA: Diagnosis not present

## 2018-04-11 DIAGNOSIS — R278 Other lack of coordination: Secondary | ICD-10-CM

## 2018-04-11 DIAGNOSIS — R262 Difficulty in walking, not elsewhere classified: Secondary | ICD-10-CM

## 2018-04-11 NOTE — Therapy (Addendum)
[place] MAIN [facility] SERVICES [address] [place], [place], [address] Phone: [phone]   Fax:  [phone]  Physical Therapy Treatment  Patient Details  Name: [name] MRN: [id number] Date of Birth: [birth year] Referring Provider (PT): Charlett Blake   Encounter Date: 04/11/2018  PT End of Session - 04/11/18 1051    Visit Number  11    Number of Visits  25    Date for PT Re-Evaluation  04/25/18    Authorization Type  4/10, last goals 03/24/18, start of care 01/31/18    PT Start Time  1055    PT Stop Time  1140    PT Time Calculation (min)  45 min    Equipment Utilized During Treatment  Gait belt    Activity Tolerance  Patient tolerated treatment well    Behavior During Therapy  WFL for tasks assessed/performed       Past Medical History:  Diagnosis Date  . Adenocarcinoma in situ of cervix   . Arthritis    hands  . Breast cancer ([place])   . Colon cancer ([place])   . Diabetes mellitus without complication ([place])   . Endometrial carcinoma (HCC)    s/p total abdominal hysterectomy  . H/O compression fracture of spine 2014   thoracic spine  . H/O polycythemia vera   . H/O TIA (transient ischemic attack) and stroke 09/2014, 03/2015   No deficits  . Hypertension   . Hyperuricemia   . Microalbuminuria   . Polycythemia vera ([place])   . Recurrent falls   . Skin cancer    face, legs  . Stroke [facility]) 2008   no deficits  . Trochanteric bursitis   . Varicose veins    treated    Past Surgical History:  Procedure Laterality Date  . ABDOMINAL HYSTERECTOMY    . BOWEL RESECTION N/A 03/28/2015   Procedure: SMALL BOWEL RESECTION;  Surgeon: Leonie Green, MD;  Location: [facility] ORS;  Service: General;  Laterality: N/A;  . CATARACT EXTRACTION W/ INTRAOCULAR LENS IMPLANT Right   . CATARACT EXTRACTION W/PHACO Left 10/07/2015   Procedure: CATARACT EXTRACTION PHACO AND INTRAOCULAR LENS PLACEMENT (IOC);  Surgeon: Ronnell Freshwater,  MD;  Location: [place];  Service: Ophthalmology;  Laterality: Left;  DIABETIC - oral meds VISION BLUE  . CORONARY ANGIOGRAPHY N/A 10/29/2017   Procedure: CORONARY ANGIOGRAPHY;  Surgeon: Dionisio David, MD;  Location: [place] CV LAB;  Service: Cardiovascular;  Laterality: N/A;  . EXPLORATORY LAPAROTOMY     for fibroids  . LEFT HEART CATH Right 10/29/2017   Procedure: Left Heart Cath;  Surgeon: Dionisio David, MD;  Location: [place] CV LAB;  Service: Cardiovascular;  Laterality: Right;  . TONSILLECTOMY      There were no vitals filed for this visit.  Subjective Assessment - 04/11/18 1104    Subjective  Patient reports she is doing well; had a nice weekend with a visitor. Pt denies any pain or new falls today. Pt states she has been doing her HEP but had to take a couple days off secondary to soreness in LEs.     Pertinent History    84 year old female with adenocarcinoma of the cervix as well as endometrial carcinoma and polycythemia vera as well as diabetes who had hypertensive right cerebellar hemorrhage onset 10/28/2017.  She was treated initially at [facility] and then transferred to [facility].  Neurosurgical evaluation per Dr. Ronnald Ramp concluded no surgery was needed.  The patient completed inpatient rehabilitation at [facility] (including SLP) 11/04/2017-11/18/2107 and was discharged at a 24-hour supervision level.  The patient has hired caregiver to provide 24-hour supervision. Patient has received home health rehab services, including SLP.    Limitations  Standing;Walking    How long can you walk comfortably?  a few minutes    Patient Stated Goals  Patient wants to walk better and not need the RW.     Currently in Pain?  No/denies             Treatment Warm up on Nustep level 2 x5 min with VCs for maintaining SPM close to 60 for cardiovascular challenge and improve activity tolerance  Sit <> stand practicefrom mat table2x10 reps; VCs  for reaching forwards when coming to stand and preparing with rocking prior to stand, tactile cuing at hip and guiding with PT's hip to slow descent into sitting; required increased time and rest breaks between repetitions secondary to fatigue;  Bilateral LE leg press 60# x10 reps, 75# x10 reps Bilateralheel raises45#2 x10reps -VCs for slowing eccentric return as well as proper foot positioning; VCs for encouragement to continue pressing  Stepping over 6 inch hurdle forwards/backwards x10 reps each direction, no UE support, CGA-min A for safety and balance with VCs for picking up feet and stepping all the way over  Side stepping over 6 inch hurdle x10 reps each direction, no UE support, CGA-min A for safety and balance with VCs for taking a large enough step to get both feet across the hurdle   Gait in hallway with SPC, x65 ft with CGA for safety, max VCs required for sequencing SPC with stepping; demonstrated 3-point step-to gait pattern with SPC and decreased speed for correct sequencing                PT Education - 04/11/18 1050    Education Details  exercise form/technique, gait safety     Person(s) Educated  Patient    Methods  Explanation;Demonstration;Verbal cues    Comprehension  Verbalized understanding;Returned demonstration;Verbal cues required;Need further instruction       PT Short Term Goals - 03/24/18 1328      PT SHORT TERM GOAL #1   Title  Patient will reduce timed up and go to <11 seconds to reduce fall risk and demonstrate improved transfer/gait ability.    Baseline  22.07 sec; 03/24/18: 14.2sec;     Time  2    Period  Weeks    Status  On-going    Target Date  04/07/18      PT SHORT TERM GOAL #2   Title  Patient will be independent with ascend/descend 2 steps using single UE in step over step pattern without LOB.    Baseline  03/24/18: CGA, single UE support, step-to pattern without LOB;     Time  2    Period  Weeks    Status  On-going     Target Date  04/07/18        PT Long Term Goals - 03/24/18 1325      PT LONG TERM GOAL #1   Title  Patient will be independent in home exercise program to improve strength/mobility for better functional independence with ADLs.    Time  12    Period  Weeks    Status  On-going    Target Date  04/25/18      PT LONG TERM GOAL #2   Title  Patient (> 58 years old)  will complete five times sit to stand test in < 15 seconds indicating an increased LE strength and improved balance    Baseline  34.21 sec; 03/24/2018: 22.9 sec    Time  12    Period  Weeks    Status  On-going    Target Date  04/25/18      PT LONG TERM GOAL #3   Title  Patient will increase six minute walk test distance to >1000 for progression to community ambulator and improve gait ability    Baseline  550 feet; 03/24/18: 795 ft.;     Time  12    Period  Weeks    Status  On-going    Target Date  04/25/18      PT LONG TERM GOAL #4   Title  Patient will increase 10 meter walk test to >1.12m/s as to improve gait speed for better community ambulation and to reduce fall risk    Baseline  .49 m/sec; 03/24/18: 1.01 m/sec;    Time  12    Period  Weeks    Status  Achieved    Target Date  04/25/18            Plan - 04/11/18 1150    Clinical Impression Statement  Patient tolerated therapy session well. Patient performed NuStep with SPM above 60 for improved cardiovascular challenge. Pt continues to required CGA-min A for sit <> stand without UE support; demonstrated improved ability with only one repetition requiring multiple attempts to stand. Pt continues to express increased fatigue following sit <> stands and requires VCs for proper technique. Pt continues to express challenge with leg press and experiences soreness following exercise but no complaints during. Pt performed BLE leg press with increased weight; verbalized increased challenge but was able to perform 10 reps with VCs for encouragement and engagement. Pt ambulated  with SPC in hallway; demonstrated improved sequencing with decreased gait speed but continues to require VCs for proper technique and sequencing. Pt will continue to benefit from skilled PT intervention for improvements in balance, strength, and gait safety.     Rehab Potential  Fair    PT Frequency  2x / week    PT Duration  12 weeks    PT Treatment/Interventions  Patient/family education;Neuromuscular re-education;Balance training;Stair training;Therapeutic activities;Therapeutic exercise;Gait training;Aquatic Therapy    PT Next Visit Plan  balance and strengthening    Consulted and Agree with Plan of Care  Patient;Family member/caregiver       Patient will benefit from skilled therapeutic intervention in order to improve the following deficits and impairments:  Abnormal gait, Decreased balance, Decreased endurance, Decreased mobility, Impaired flexibility, Decreased strength, Decreased knowledge of use of DME, Decreased activity tolerance, Difficulty walking  Visit Diagnosis: Muscle weakness (generalized)  Difficulty in walking, not elsewhere classified     Problem List Patient Active Problem List   Diagnosis Date Noted  . Cognitive deficit, post-stroke 12/31/2017  . Ataxia, post-stroke 12/31/2017  . Dysarthria, post-stroke   . Gait disturbance, post-stroke   . H/O cerebral parenchymal hemorrhage 11/04/2017  . Essential hypertension 11/04/2017  . Hyperlipidemia 11/04/2017  . Diabetes ([place]) 11/04/2017  . CAD (coronary artery disease) 11/04/2017  . 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 Hypokalemia 11/04/2017  . Right-sided nontraumatic intracerebral hemorrhage of cerebellum ([place])   . History of cervical cancer   . History of TIA (transient ischemic attack)   . Acute systolic congestive heart failure ([place])   . Reactive hypertension   . Hypernatremia   .  Leukocytosis   . Acute blood loss anemia   . Elevated serum creatinine   . Acute respiratory failure with hypoxia ([place])    . IVH (intraventricular hemorrhage) ([place]) 10/29/2017  . Hypoxia 10/28/2017  . Pancreatic lesion 05/24/2017  . 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 Nodule of upper lobe of left lung 06/04/2015  . 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 Cerebral thrombosis with cerebral infarction 04/03/2015  . Cancer of right colon ([place]) 03/28/2015  . CVA (cerebral infarction) 12/21/2014  . Polycythemia vera ([place]) 06/22/2006   Harriet Masson, SPT This entire session was performed under direct supervision and direction of a licensed therapist/therapist assistant . I have personally read, edited and approve of the note as written.  Trotter,Margaret PT, DPT 04/11/2018, 2:30 PM  [place] MAIN [facility] SERVICES [address] [place], [place], [address] Phone: [phone]   Fax:  [phone]  Name: AYSE MCCARTIN MRN: [id number] Date of Birth: 1932/04/08

## 2018-04-11 NOTE — Therapy (Addendum)
[place] MAIN [facility] SERVICES [address] [place], [place], [address] Phone: [phone]   Fax:  [phone]  Occupational Therapy Treatment  Patient Details  Name: [name] MRN: [id number] Date of Birth: 1931-07-09 No data recorded  Encounter Date: 04/11/2018  OT End of Session - 04/11/18 1159    Visit Number  12    Number of Visits  24    Date for OT Re-Evaluation  06/20/18    Authorization Type  Medicare    Authorization Time Period  visit 2/10 for reporting period starting 04/07/2018    OT Start Time  1145    OT Stop Time  1230    OT Time Calculation (min)  45 min    Activity Tolerance  Patient tolerated treatment well    Behavior During Therapy  [facility] for tasks assessed/performed       Past Medical History:  Diagnosis Date  . Adenocarcinoma in situ of cervix   . Arthritis    hands  . Breast cancer ([place])   . Colon cancer ([place])   . Diabetes mellitus without complication ([place])   . Endometrial carcinoma (HCC)    s/p total abdominal hysterectomy  . H/O compression fracture of spine 2014   thoracic spine  . H/O polycythemia vera   . H/O TIA (transient ischemic attack) and stroke 09/2014, 03/2015   No deficits  . Hypertension   . Hyperuricemia   . Microalbuminuria   . Polycythemia vera ([place])   . Recurrent falls   . Skin cancer    face, legs  . Stroke [facility]) 2008   no deficits  . Trochanteric bursitis   . Varicose veins    treated    Past Surgical History:  Procedure Laterality Date  . ABDOMINAL HYSTERECTOMY    . BOWEL RESECTION N/A 03/28/2015   Procedure: SMALL BOWEL RESECTION;  Surgeon: Leonie Green, MD;  Location: [facility] ORS;  Service: General;  Laterality: N/A;  . CATARACT EXTRACTION W/ INTRAOCULAR LENS IMPLANT Right   . CATARACT EXTRACTION W/PHACO Left 10/07/2015   Procedure: CATARACT EXTRACTION PHACO AND INTRAOCULAR LENS PLACEMENT (IOC);  Surgeon: Ronnell Freshwater, MD;  Location: [place];  Service: Ophthalmology;  Laterality: Left;  DIABETIC - oral meds VISION BLUE  . CORONARY ANGIOGRAPHY N/A 10/29/2017   Procedure: CORONARY ANGIOGRAPHY;  Surgeon: Dionisio David, MD;  Location: [place] CV LAB;  Service: Cardiovascular;  Laterality: N/A;  . EXPLORATORY LAPAROTOMY     for fibroids  . LEFT HEART CATH Right 10/29/2017   Procedure: Left Heart Cath;  Surgeon: Dionisio David, MD;  Location: [place] CV LAB;  Service: Cardiovascular;  Laterality: Right;  . TONSILLECTOMY      There were no vitals filed for this visit.  Subjective Assessment - 04/11/18 1157    Subjective   Pt reports that she is doing well today.    Pertinent History  84 year old female with adenocarcinoma of the cervix as well as endometrial carcinoma and polycythemia vera as well as diabetes who had hypertensive right cerebellar hemorrhage onset 10/28/2017.  She was treated initially at [facility] and then transferred to [facility].  Neurosurgical evaluation per Dr. Ronnald Ramp concluded no surgery was needed.  The patient completed inpatient rehabilitation at [facility] (including SLP) 11/04/2017-11/18/2107 and was discharged at a 24-hour supervision level.  The patient has hired caregiver to provide 24-hour supervision. Patient has received home health rehab services, including SLP.  Patient Stated Goals  Patient would like to be independent in all tasks and be able to live alone again.    Currently in Pain?  No/denies    Pain Score  0-No pain      OT TREATMENT  Neuromuscular re-education  Pt completed tasks that required her to use a R hand tripod grasp to remove 1" cube against velcro resistance and replace them, alternating between her 2nd and 3rd digit to secure them in place. The velcro board was placed at an angle to promote wrist extension when removing the cubes. Pt required VC to isolate 2nd and 3rd digit to secure cubes when replacing them. Pt demonstrated increased effort  to remove cubes as the task progressed and required multiple trials, often having to reposition her hand to remove the latter cubes. Pt completed task to prepare hands for functional use during ADLs and IADLs.  Self-care/IADLs:   Pt completed writing task that required her to copy a short recipe onto unlined paper while using a standard pen with a large grip. Pt required increased time to complete the task and used her L hand 2nd digit to stay oriented on the writing sample. Pt demonstrated increased legibility with print forms (~50%) compared to cursive forms (~25%). Pt did not significantly deviate from the line nor page margins. Pt's legibility decreased overall, as the task progressed.                 OT Education - 04/11/18 1158    Education Details  [place] skills, pinch strengthening, handwriting    Person(s) Educated  Patient    Methods  Explanation;Demonstration;Verbal cues    Comprehension  Verbalized understanding;Returned demonstration;Verbal cues required          OT Long Term Goals - 03/28/18 1412      OT LONG TERM GOAL #1   Title  Patient will complete bathing with modified independence    Baseline  Pt. continues to require ModA    Time  12    Period  Weeks    Status  On-going    Target Date  06/20/18      OT LONG TERM GOAL #2   Title  Patient will complete dressing skills with modified independence    Baseline  moderate assist    Time  12    Period  Weeks    Status  On-going    Target Date  06/20/18      OT LONG TERM GOAL #3   Title  Patient will complete light meal prep with min assist    Baseline  max assist    Time  12    Period  Weeks    Status  On-going    Target Date  06/20/18      OT LONG TERM GOAL #4   Title  Patient will increase R grip strength by 5# to open jars and containers with modified independence    Baseline  Pt. conitnues to have difficulaty opening jars, and containers.    Time  12    Period  Weeks    Status  On-going     Target Date  06/20/18      OT LONG TERM GOAL #5   Title  Patient will improve strength by 1 mm grade RUE to assist with obtaining items from the closet.     Baseline  4/5 overall RUE. Pt. conitnues to have difficulty obtaining items from the closet.    Time  8  Period  Weeks    Target Date  06/20/18      OT LONG TERM GOAL #6   Title  Pt will increase handwriting legibility and speed to 50% while writing a 3 sentence paragraph in order to write thank you notes and birthday cards for friends and family    Baseline  03/14/2018:  25% legibility to writie a sentence. 50% legibility for her name    Time  4    Period  Weeks    Status  On-going    Target Date  06/20/18            Plan - 04/11/18 1159    Clinical Impression Statement  Pt continues to work to improve R hand grip and pinch strength, as well as handwriting. Pt reports that she would like to continue to work on improving her handwriting for print and cursive forms. Pt has shown some improvements in writing legibility, however continues to work to increase writing speed. Improvements in these skills are required to promote independence and UE function during ADLs and IADLs.    Occupational Profile and client history currently impacting functional performance  family more than 30 mins away, has 24 hour caregiver assist, decreased awareness, memory and safety    Occupational performance deficits (Please refer to evaluation for details):  ADL's;IADL's;Social Participation    Rehab Potential  Good    Current Impairments/barriers affecting progress:  memory, requires 24 hour caregivers,     OT Frequency  2x / week    OT Duration  12 weeks    OT Treatment/Interventions  Self-care/ADL training;Cryotherapy;Therapeutic exercise;DME and/or AE instruction;Functional Mobility Training;Cognitive remediation/compensation;Balance training;Neuromuscular education;Manual Therapy;Moist Heat;Therapeutic activities;Patient/family education     Clinical Decision Making  Several treatment options, min-mod task modification necessary    Consulted and Agree with Plan of Care  Patient    Family Member Consulted  caregiver       Patient will benefit from skilled therapeutic intervention in order to improve the following deficits and impairments:  Decreased balance, Decreased mobility, Difficulty walking, Decreased cognition, Decreased activity tolerance, Decreased coordination, Decreased safety awareness, Decreased strength, Impaired UE functional use  Visit Diagnosis: Other lack of coordination  Muscle weakness (generalized)    Problem List Patient Active Problem List   Diagnosis Date Noted  . Cognitive deficit, post-stroke 12/31/2017  . Ataxia, post-stroke 12/31/2017  . Dysarthria, post-stroke   . Gait disturbance, post-stroke   . H/O cerebral parenchymal hemorrhage 11/04/2017  . Essential hypertension 11/04/2017  . Hyperlipidemia 11/04/2017  . Diabetes ([place]) 11/04/2017  . CAD (coronary artery disease) 11/04/2017  . 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 Hypokalemia 11/04/2017  . Right-sided nontraumatic intracerebral hemorrhage of cerebellum ([place])   . History of cervical cancer   . History of TIA (transient ischemic attack)   . Acute systolic congestive heart failure ([place])   . Reactive hypertension   . Hypernatremia   . Leukocytosis   . Acute blood loss anemia   . Elevated serum creatinine   . Acute respiratory failure with hypoxia ([place])   . IVH (intraventricular hemorrhage) ([place]) 10/29/2017  . Hypoxia 10/28/2017  . Pancreatic lesion 05/24/2017  . 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 Nodule of upper lobe of left lung 06/04/2015  . 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 Cerebral thrombosis with cerebral infarction 04/03/2015  . Cancer of right colon ([place]) 03/28/2015  . CVA (cerebral infarction) 12/21/2014  . Polycythemia vera ([place]) 06/22/2006    [name]  [name], OTS 04/11/2018,  12:06 PM   This entire session was performed under direct supervision and direction of a licensed therapist/therapist assistant.  I have personally read, edited and approve of the note as written.  Harrel Carina, MS, OTR/L   [place] MAIN [facility] SERVICES [address] [place], [place], [address] Phone: [phone]   Fax:  [phone]  Name: [name] MRN: [id number] Date of Birth: 10-04-1931

## 2018-04-12 ENCOUNTER — Ambulatory Visit: Payer: Medicare Other | Admitting: Physical Medicine & Rehabilitation

## 2018-04-13 ENCOUNTER — Ambulatory Visit: Payer: Medicare Other | Admitting: Physical Therapy

## 2018-04-13 ENCOUNTER — Encounter: Payer: Medicare Other | Admitting: Occupational Therapy

## 2018-04-15 ENCOUNTER — Ambulatory Visit: Payer: Medicare Other | Admitting: Occupational Therapy

## 2018-04-15 ENCOUNTER — Encounter: Payer: Self-pay | Admitting: Occupational Therapy

## 2018-04-15 DIAGNOSIS — M6281 Muscle weakness (generalized): Secondary | ICD-10-CM | POA: Diagnosis not present

## 2018-04-15 DIAGNOSIS — R278 Other lack of coordination: Secondary | ICD-10-CM

## 2018-04-15 NOTE — Therapy (Addendum)
[place] MAIN [facility] SERVICES [address] [place], [place], [address] Phone: [phone]   Fax:  [phone]  Occupational Therapy Treatment  Patient Details  Name: [name] MRN: [id number] Date of Birth: Apr 23, 1932 No data recorded  Encounter Date: 04/15/2018  OT End of Session - 04/15/18 1027    Visit Number  13    Number of Visits  24    Date for OT Re-Evaluation  06/20/18    Authorization Type  Medicare    Authorization Time Period  visit 3/10 for reporting period starting 04/07/2018    OT Start Time  1015    OT Stop Time  1100    OT Time Calculation (min)  45 min    Activity Tolerance  Patient tolerated treatment well    Behavior During Therapy  [facility] for tasks assessed/performed       Past Medical History:  Diagnosis Date  . Adenocarcinoma in situ of cervix   . Arthritis    hands  . Breast cancer ([place])   . Colon cancer ([place])   . Diabetes mellitus without complication ([place])   . Endometrial carcinoma (HCC)    s/p total abdominal hysterectomy  . H/O compression fracture of spine 2014   thoracic spine  . H/O polycythemia vera   . H/O TIA (transient ischemic attack) and stroke 09/2014, 03/2015   No deficits  . Hypertension   . Hyperuricemia   . Microalbuminuria   . Polycythemia vera ([place])   . Recurrent falls   . Skin cancer    face, legs  . Stroke [facility]) 2008   no deficits  . Trochanteric bursitis   . Varicose veins    treated    Past Surgical History:  Procedure Laterality Date  . ABDOMINAL HYSTERECTOMY    . BOWEL RESECTION N/A 03/28/2015   Procedure: SMALL BOWEL RESECTION;  Surgeon: Leonie Green, MD;  Location: [facility] ORS;  Service: General;  Laterality: N/A;  . CATARACT EXTRACTION W/ INTRAOCULAR LENS IMPLANT Right   . CATARACT EXTRACTION W/PHACO Left 10/07/2015   Procedure: CATARACT EXTRACTION PHACO AND INTRAOCULAR LENS PLACEMENT (IOC);  Surgeon: Ronnell Freshwater, MD;  Location: [place];  Service: Ophthalmology;  Laterality: Left;  DIABETIC - oral meds VISION BLUE  . CORONARY ANGIOGRAPHY N/A 10/29/2017   Procedure: CORONARY ANGIOGRAPHY;  Surgeon: Dionisio David, MD;  Location: [place] CV LAB;  Service: Cardiovascular;  Laterality: N/A;  . EXPLORATORY LAPAROTOMY     for fibroids  . LEFT HEART CATH Right 10/29/2017   Procedure: Left Heart Cath;  Surgeon: Dionisio David, MD;  Location: [place] CV LAB;  Service: Cardiovascular;  Laterality: Right;  . TONSILLECTOMY      There were no vitals filed for this visit.  Subjective Assessment - 04/15/18 1024    Subjective   Pt reports that she is doing well today and that she would like to continue working to improve her handwriting legibility.    Pertinent History  84 year old female with adenocarcinoma of the cervix as well as endometrial carcinoma and polycythemia vera as well as diabetes who had hypertensive right cerebellar hemorrhage onset 10/28/2017.  She was treated initially at [facility] and then transferred to [facility].  Neurosurgical evaluation per Dr. Ronnald Ramp concluded no surgery was needed.  The patient completed inpatient rehabilitation at [facility] (including SLP) 11/04/2017-11/18/2107 and was discharged at a 24-hour supervision level.  The patient has hired caregiver to provide 24-hour supervision.  Patient has received home health rehab services, including SLP.     Patient Stated Goals  Patient would like to be independent in all tasks and be able to live alone again.    Currently in Pain?  No/denies    Pain Score  0-No pain      OT TREATMENT  Neuromuscular re-education:  Pt. performed right hand [facility] task using the Grooved pegboard. Pt. worked on grasping the grooved pegs from a horizontal position and moving the pegs to a vertical position in the hand to prepare for placing them in the grooved slot. Pt was unable to translate pegs from her palm to her fingertips to place into each  slot. Pt was able to translate flat marbles from her palm to her fingertips with increased time and tactile cuing for thumb movement. Pt was unable to store x5 marbles in her palm while attempting to translate them.  Self-care/IADLs:   Pt completed writing task that required her to copy a short writing sample onto unlined paper while using a standard pen with a large grip. Pt required increased time to complete the task. Pt demonstrated increased legibility and writing speed with print forms (~50%) compared to cursive forms (~25%). Pt prefers to write in cursive however notes that her print is more legible at this time. Pt did not significantly deviate from the line nor page margins. Pt's legibility decreased overall, as the task progressed. Pt reported increased hand fatigue as the task progressed.                     OT Education - 04/15/18 1026    Education Details  [place] skills, handwriting, hand strengthening    Person(s) Educated  Patient    Methods  Explanation;Demonstration;Verbal cues    Comprehension  Verbalized understanding;Returned demonstration;Verbal cues required          OT Long Term Goals - 03/28/18 1412      OT LONG TERM GOAL #1   Title  Patient will complete bathing with modified independence    Baseline  Pt. continues to require ModA    Time  12    Period  Weeks    Status  On-going    Target Date  06/20/18      OT LONG TERM GOAL #2   Title  Patient will complete dressing skills with modified independence    Baseline  moderate assist    Time  12    Period  Weeks    Status  On-going    Target Date  06/20/18      OT LONG TERM GOAL #3   Title  Patient will complete light meal prep with min assist    Baseline  max assist    Time  12    Period  Weeks    Status  On-going    Target Date  06/20/18      OT LONG TERM GOAL #4   Title  Patient will increase R grip strength by 5# to open jars and containers with modified independence    Baseline   Pt. conitnues to have difficulaty opening jars, and containers.    Time  12    Period  Weeks    Status  On-going    Target Date  06/20/18      OT LONG TERM GOAL #5   Title  Patient will improve strength by 1 mm grade RUE to assist with obtaining items from the closet.  Baseline  4/5 overall RUE. Pt. conitnues to have difficulty obtaining items from the closet.    Time  8    Period  Weeks    Target Date  06/20/18      OT LONG TERM GOAL #6   Title  Pt will increase handwriting legibility and speed to 50% while writing a 3 sentence paragraph in order to write thank you notes and birthday cards for friends and family    Baseline  03/14/2018:  25% legibility to writie a sentence. 50% legibility for her name    Time  4    Period  Weeks    Status  On-going    Target Date  06/20/18            Plan - 04/15/18 1027    Clinical Impression Statement  Pt continues to work to improve right hand [place] skills in order to improve handwriting and increase independence during ADLs and IADLs. Pt demonstrates greater legibility when writing in print compared to cursive. Pt demonstrates decreased [facility] skills when manipulating small items. Pt continues to work on thumb movements to increase translatory movements for small items such as coins and pills. Increased [facility] skills supports pt's independence during ADLs and IADLs.    Occupational Profile and client history currently impacting functional performance  family more than 30 mins away, has 24 hour caregiver assist, decreased awareness, memory and safety    Occupational performance deficits (Please refer to evaluation for details):  ADL's;IADL's;Social Participation    Rehab Potential  Good    Current Impairments/barriers affecting progress:  memory, requires 24 hour caregivers,     OT Frequency  2x / week    OT Duration  12 weeks    OT Treatment/Interventions  Self-care/ADL training;Cryotherapy;Therapeutic exercise;DME and/or AE instruction;Functional  Mobility Training;Cognitive remediation/compensation;Balance training;Neuromuscular education;Manual Therapy;Moist Heat;Therapeutic activities;Patient/family education    Clinical Decision Making  Several treatment options, min-mod task modification necessary    Consulted and Agree with Plan of Care  Patient    Family Member Consulted  caregiver       Patient will benefit from skilled therapeutic intervention in order to improve the following deficits and impairments:  Decreased balance, Decreased mobility, Difficulty walking, Decreased cognition, Decreased activity tolerance, Decreased coordination, Decreased safety awareness, Decreased strength, Impaired UE functional use  Visit Diagnosis: Other lack of coordination  Muscle weakness (generalized)    Problem List Patient Active Problem List   Diagnosis Date Noted  . Cognitive deficit, post-stroke 12/31/2017  . Ataxia, post-stroke 12/31/2017  . Dysarthria, post-stroke   . Gait disturbance, post-stroke   . H/O cerebral parenchymal hemorrhage 11/04/2017  . Essential hypertension 11/04/2017  . Hyperlipidemia 11/04/2017  . Diabetes ([place]) 11/04/2017  . CAD (coronary artery disease) 11/04/2017  . 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 Hypokalemia 11/04/2017  . Right-sided nontraumatic intracerebral hemorrhage of cerebellum ([place])   . History of cervical cancer   . History of TIA (transient ischemic attack)   . Acute systolic congestive heart failure ([place])   . Reactive hypertension   . Hypernatremia   . Leukocytosis   . Acute blood loss anemia   . Elevated serum creatinine   . Acute respiratory failure with hypoxia ([place])   . IVH (intraventricular hemorrhage) ([place]) 10/29/2017  . Hypoxia 10/28/2017  . Pancreatic lesion 05/24/2017  . 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 Nodule of upper lobe of left lung 06/04/2015  . 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 Cerebral thrombosis  with cerebral  infarction 04/03/2015  . Cancer of right colon ([place]) 03/28/2015  . CVA (cerebral infarction) 12/21/2014  . Polycythemia vera ([place]) 06/22/2006  This entire session was performed under direct supervision and direction of a licensed therapist/therapist assistant . I have personally read, edited and approve of the note as written.  Amy Oneita Jolly, OTR/L, CLT  Oliver Hum, OTS 04/15/2018, 11:15 AM  [place] MAIN [facility] SERVICES [address] [place], [place], [address] Phone: [phone]   Fax:  [phone]  Name: [name] MRN: [id number] Date of Birth: [birth year]

## 2018-04-18 ENCOUNTER — Ambulatory Visit: Payer: Medicare Other | Admitting: Physical Therapy

## 2018-04-18 ENCOUNTER — Encounter: Payer: Self-pay | Admitting: Occupational Therapy

## 2018-04-18 ENCOUNTER — Encounter: Payer: Self-pay | Admitting: Physical Therapy

## 2018-04-18 ENCOUNTER — Ambulatory Visit: Payer: Medicare Other | Admitting: Occupational Therapy

## 2018-04-18 DIAGNOSIS — R278 Other lack of coordination: Secondary | ICD-10-CM

## 2018-04-18 DIAGNOSIS — M6281 Muscle weakness (generalized): Secondary | ICD-10-CM

## 2018-04-18 DIAGNOSIS — R262 Difficulty in walking, not elsewhere classified: Secondary | ICD-10-CM

## 2018-04-18 NOTE — Therapy (Addendum)
[place] MAIN [facility] SERVICES [address] [place], [place], [address] Phone: [phone]   Fax:  [phone]  Occupational Therapy Treatment  Patient Details  Name: [name] MRN: [id number] Date of Birth: 07-29-1931 No data recorded  Encounter Date: 04/18/2018  OT End of Session - 04/18/18 1228    Visit Number  14    Number of Visits  24    Date for OT Re-Evaluation  06/20/18    Authorization Type  Medicare    Authorization Time Period  visit 4/10 for reporting period starting 04/07/2018    OT Start Time  1145    OT Stop Time  1230    OT Time Calculation (min)  45 min    Activity Tolerance  Patient tolerated treatment well    Behavior During Therapy  [facility] for tasks assessed/performed       Past Medical History:  Diagnosis Date  . Adenocarcinoma in situ of cervix   . Arthritis    hands  . Breast cancer ([place])   . Colon cancer ([place])   . Diabetes mellitus without complication ([place])   . Endometrial carcinoma (HCC)    s/p total abdominal hysterectomy  . H/O compression fracture of spine 2014   thoracic spine  . H/O polycythemia vera   . H/O TIA (transient ischemic attack) and stroke 09/2014, 03/2015   No deficits  . Hypertension   . Hyperuricemia   . Microalbuminuria   . Polycythemia vera ([place])   . Recurrent falls   . Skin cancer    face, legs  . Stroke [facility]) 2008   no deficits  . Trochanteric bursitis   . Varicose veins    treated    Past Surgical History:  Procedure Laterality Date  . ABDOMINAL HYSTERECTOMY    . BOWEL RESECTION N/A 03/28/2015   Procedure: SMALL BOWEL RESECTION;  Surgeon: Leonie Green, MD;  Location: [facility] ORS;  Service: General;  Laterality: N/A;  . CATARACT EXTRACTION W/ INTRAOCULAR LENS IMPLANT Right   . CATARACT EXTRACTION W/PHACO Left 10/07/2015   Procedure: CATARACT EXTRACTION PHACO AND INTRAOCULAR LENS PLACEMENT (IOC);  Surgeon: Ronnell Freshwater, MD;  Location: [place];  Service: Ophthalmology;  Laterality: Left;  DIABETIC - oral meds VISION BLUE  . CORONARY ANGIOGRAPHY N/A 10/29/2017   Procedure: CORONARY ANGIOGRAPHY;  Surgeon: Dionisio David, MD;  Location: [place] CV LAB;  Service: Cardiovascular;  Laterality: N/A;  . EXPLORATORY LAPAROTOMY     for fibroids  . LEFT HEART CATH Right 10/29/2017   Procedure: Left Heart Cath;  Surgeon: Dionisio David, MD;  Location: [place] CV LAB;  Service: Cardiovascular;  Laterality: Right;  . TONSILLECTOMY      There were no vitals filed for this visit.  Subjective Assessment - 04/18/18 1226    Subjective   Pt reports that she is doing well today.    Pertinent History  84 year old female with adenocarcinoma of the cervix as well as endometrial carcinoma and polycythemia vera as well as diabetes who had hypertensive right cerebellar hemorrhage onset 10/28/2017.  She was treated initially at [facility] and then transferred to [facility].  Neurosurgical evaluation per Dr. Ronnald Ramp concluded no surgery was needed.  The patient completed inpatient rehabilitation at [facility] (including SLP) 11/04/2017-11/18/2107 and was discharged at a 24-hour supervision level.  The patient has hired caregiver to provide 24-hour supervision. Patient has received home health rehab services, including SLP.  Patient Stated Goals  Patient would like to be independent in all tasks and be able to live alone again.    Currently in Pain?  No/denies    Pain Score  0-No pain      OT TREATMENT  Neuromuscular re-education:  Pt. performed [facility] skills training to improve speed and dexterity needed for ADL tasks and writing. Pt. demonstrated grasping 1 inch sticks,  inch cylindrical collars, and  inch flat washers on the [employer]urdue pegboard using her right hand. Pt. performed grasping each item with her 2nd digit and thumb, and storing them in the palm. Pt. presented with difficulty storing  inch objects at a time in the  palmar aspect of the hand. Pt then worked on storing and translating round marbles in her hand. Pt was able to store x5 marbles in her hand and translate them to her fingertips before placing in container. Pt did not drop any marbles today, however required increased time to complete task.  Self-care/IADLs:   Pt. Completed check writing task with verbal cues and increased time. Pt used a standard pen with a larger grip during the task. Pt completed one check while writing in cursive. Pt demonstrated decreased legibility (~50%) and poor spacing between words as she was unable to write the full amount on the line. Pt presented with frustration as she progressed through the task.                 OT Education - 04/18/18 1227    Education Details  Mobile skills, handwriting    Person(s) Educated  Patient    Methods  Explanation;Demonstration;Verbal cues    Comprehension  Verbalized understanding;Returned demonstration;Verbal cues required          OT Long Term Goals - 03/28/18 1412      OT LONG TERM GOAL #1   Title  Patient will complete bathing with modified independence    Baseline  Pt. continues to require ModA    Time  12    Period  Weeks    Status  On-going    Target Date  06/20/18      OT LONG TERM GOAL #2   Title  Patient will complete dressing skills with modified independence    Baseline  moderate assist    Time  12    Period  Weeks    Status  On-going    Target Date  06/20/18      OT LONG TERM GOAL #3   Title  Patient will complete light meal prep with min assist    Baseline  max assist    Time  12    Period  Weeks    Status  On-going    Target Date  06/20/18      OT LONG TERM GOAL #4   Title  Patient will increase R grip strength by 5# to open jars and containers with modified independence    Baseline  Pt. conitnues to have difficulaty opening jars, and containers.    Time  12    Period  Weeks    Status  On-going    Target Date  06/20/18      OT  LONG TERM GOAL #5   Title  Patient will improve strength by 1 mm grade RUE to assist with obtaining items from the closet.     Baseline  4/5 overall RUE. Pt. conitnues to have difficulty obtaining items from the closet.    Time  8    Period  Weeks    Target Date  06/20/18      OT LONG TERM GOAL #6   Title  Pt will increase handwriting legibility and speed to 50% while writing a 3 sentence paragraph in order to write thank you notes and birthday cards for friends and family    Baseline  03/14/2018:  25% legibility to writie a sentence. 50% legibility for her name    Time  4    Period  Weeks    Status  On-going    Target Date  06/20/18            Plan - 04/18/18 1229    Clinical Impression Statement  Pt continues to work on right hand [facility] skills and handwriting legibility for print and cursive. Pt is working to improve in-hand translatory movements to move small objects through her hand while storing items in her palm. Increased [facility] skills are required to support independence duing ADLs including writing checks and signing documents.    Occupational Profile and client history currently impacting functional performance  family more than 30 mins away, has 24 hour caregiver assist, decreased awareness, memory and safety    Occupational performance deficits (Please refer to evaluation for details):  ADL's;IADL's;Social Participation    Rehab Potential  Good    Current Impairments/barriers affecting progress:  memory, requires 24 hour caregivers,     OT Frequency  2x / week    OT Duration  12 weeks    OT Treatment/Interventions  Self-care/ADL training;Cryotherapy;Therapeutic exercise;DME and/or AE instruction;Functional Mobility Training;Cognitive remediation/compensation;Balance training;Neuromuscular education;Manual Therapy;Moist Heat;Therapeutic activities;Patient/family education    Clinical Decision Making  Several treatment options, min-mod task modification necessary    Consulted and  Agree with Plan of Care  Patient    Family Member Consulted  caregiver       Patient will benefit from skilled therapeutic intervention in order to improve the following deficits and impairments:  Decreased balance, Decreased mobility, Difficulty walking, Decreased cognition, Decreased activity tolerance, Decreased coordination, Decreased safety awareness, Decreased strength, Impaired UE functional use  Visit Diagnosis: Other lack of coordination    Problem List Patient Active Problem List   Diagnosis Date Noted  . Cognitive deficit, post-stroke 12/31/2017  . Ataxia, post-stroke 12/31/2017  . Dysarthria, post-stroke   . Gait disturbance, post-stroke   . H/O cerebral parenchymal hemorrhage 11/04/2017  . Essential hypertension 11/04/2017  . Hyperlipidemia 11/04/2017  . Diabetes ([place]) 11/04/2017  . CAD (coronary artery disease) 11/04/2017  . 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 Hypokalemia 11/04/2017  . Right-sided nontraumatic intracerebral hemorrhage of cerebellum ([place])   . History of cervical cancer   . History of TIA (transient ischemic attack)   . Acute systolic congestive heart failure ([place])   . Reactive hypertension   . Hypernatremia   . Leukocytosis   . Acute blood loss anemia   . Elevated serum creatinine   . Acute respiratory failure with hypoxia ([place])   . IVH (intraventricular hemorrhage) ([place]) 10/29/2017  . Hypoxia 10/28/2017  . Pancreatic lesion 05/24/2017  . 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 Nodule of upper lobe of left lung 06/04/2015  . 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 Cerebral thrombosis with cerebral infarction 04/03/2015  . Cancer of right colon ([place]) 03/28/2015  . CVA (cerebral infarction) 12/21/2014  . Polycythemia vera ([place]) 06/22/2006    Oliver Hum, OTS 04/18/2018, 1:07 PM  [place] MAIN [facility] SERVICES [place],  [place], [address] Phone:  [phone]   Fax:  [phone]  Name: [name] MRN: [id number] Date of Birth: [birth year]

## 2018-04-18 NOTE — Therapy (Addendum)
[place] MAIN [facility] SERVICES [address] [place], [place], [address] Phone: [phone]   Fax:  [phone]  Physical Therapy Treatment  Patient Details  Name: [name] MRN: [id number] Date of Birth: September 24, 1931 Referring Provider (PT): Charlett Blake   Encounter Date: 04/18/2018  PT End of Session - 04/18/18 1049    Visit Number  12    Number of Visits  25    Date for PT Re-Evaluation  04/25/18    Authorization Type  5/10, last goals 03/24/18, start of care 01/31/18    PT Start Time  1055    PT Stop Time  1140    PT Time Calculation (min)  45 min    Equipment Utilized During Treatment  Gait belt    Activity Tolerance  Patient tolerated treatment well    Behavior During Therapy  WFL for tasks assessed/performed       Past Medical History:  Diagnosis Date  . Adenocarcinoma in situ of cervix   . Arthritis    hands  . Breast cancer ([place])   . Colon cancer ([place])   . Diabetes mellitus without complication ([place])   . Endometrial carcinoma (HCC)    s/p total abdominal hysterectomy  . H/O compression fracture of spine 2014   thoracic spine  . H/O polycythemia vera   . H/O TIA (transient ischemic attack) and stroke 09/2014, 03/2015   No deficits  . Hypertension   . Hyperuricemia   . Microalbuminuria   . Polycythemia vera ([place])   . Recurrent falls   . Skin cancer    face, legs  . Stroke [facility]) 2008   no deficits  . Trochanteric bursitis   . Varicose veins    treated    Past Surgical History:  Procedure Laterality Date  . ABDOMINAL HYSTERECTOMY    . BOWEL RESECTION N/A 03/28/2015   Procedure: SMALL BOWEL RESECTION;  Surgeon: Leonie Green, MD;  Location: [facility] ORS;  Service: General;  Laterality: N/A;  . CATARACT EXTRACTION W/ INTRAOCULAR LENS IMPLANT Right   . CATARACT EXTRACTION W/PHACO Left 10/07/2015   Procedure: CATARACT EXTRACTION PHACO AND INTRAOCULAR LENS PLACEMENT (IOC);  Surgeon: Ronnell Freshwater,  MD;  Location: [place];  Service: Ophthalmology;  Laterality: Left;  DIABETIC - oral meds VISION BLUE  . CORONARY ANGIOGRAPHY N/A 10/29/2017   Procedure: CORONARY ANGIOGRAPHY;  Surgeon: Dionisio David, MD;  Location: [place] CV LAB;  Service: Cardiovascular;  Laterality: N/A;  . EXPLORATORY LAPAROTOMY     for fibroids  . LEFT HEART CATH Right 10/29/2017   Procedure: Left Heart Cath;  Surgeon: Dionisio David, MD;  Location: [place] CV LAB;  Service: Cardiovascular;  Laterality: Right;  . TONSILLECTOMY      There were no vitals filed for this visit.  Subjective Assessment - 04/18/18 1101    Subjective  Patient reports she is doing well; denies any pain and new falls since last visit. She says "my balance is still not good."     Pertinent History    84 year old female with adenocarcinoma of the cervix as well as endometrial carcinoma and polycythemia vera as well as diabetes who had hypertensive right cerebellar hemorrhage onset 10/28/2017.  She was treated initially at [facility] and then transferred to [facility].  Neurosurgical evaluation per Dr. Ronnald Ramp concluded no surgery was needed.  The patient completed inpatient rehabilitation at [facility] (including SLP) 11/04/2017-11/18/2107 and was discharged at a 24-hour supervision  level.  The patient has hired caregiver to provide 24-hour supervision. Patient has received home health rehab services, including SLP.    Limitations  Standing;Walking    How long can you walk comfortably?  a few minutes    Patient Stated Goals  Patient wants to walk better and not need the RW.     Currently in Pain?  No/denies           Treatment Warm up on Nustep level 2 x96min with VCs for maintaining SPM close to 60 for cardiovascular challenge and improve activity tolerance  Sit <> stand practicefrom mat tablex10 reps with airex pad under feet; VCs for reaching forwards when coming to stand and preparing with  rocking prior to stand, tactile cuing at hip and guiding with PT's hip to slow descent into sitting; required increased time and occasional min A to come to standing  Standing on airex pad in // bars: -Tandem stance without UE support, head turns side/side x5 reps, up/down x5 reps with each foot in rear -Toe taps onto stepping stones with PT calling out foot and color to tap x3 min with CGA-min A for stability; requires VCs to not use UEs -Balloon passes with PT x2 min with VCs to reach outside BOS to challenge balance and to not hold onto // bar; one LOB noted when pt corrected with // bar support -Balloon passes with self against wall x2 min with narrow BOS, CGA-min A required; required sitting rest break between exercises   1/2 foam roll, flat side up in // bars: -AP rocks x2 min with faded UE support 2-1-0 -Tandem stance x30 sec holds without UE support CGA-min A required with decreased UE support, VCs for proper technique and sequencing                  PT Education - 04/18/18 1049    Education Details  exercise form/technique, gait safety    Person(s) Educated  Patient    Methods  Explanation;Demonstration;Verbal cues    Comprehension  Returned demonstration;Verbalized understanding;Verbal cues required;Need further instruction       PT Short Term Goals - 03/24/18 1328      PT SHORT TERM GOAL #1   Title  Patient will reduce timed up and go to <11 seconds to reduce fall risk and demonstrate improved transfer/gait ability.    Baseline  22.07 sec; 03/24/18: 14.2sec;     Time  2    Period  Weeks    Status  On-going    Target Date  04/07/18      PT SHORT TERM GOAL #2   Title  Patient will be independent with ascend/descend 2 steps using single UE in step over step pattern without LOB.    Baseline  03/24/18: CGA, single UE support, step-to pattern without LOB;     Time  2    Period  Weeks    Status  On-going    Target Date  04/07/18        PT Long Term Goals -  03/24/18 1325      PT LONG TERM GOAL #1   Title  Patient will be independent in home exercise program to improve strength/mobility for better functional independence with ADLs.    Time  12    Period  Weeks    Status  On-going    Target Date  04/25/18      PT LONG TERM GOAL #2   Title  Patient (> 72 years old) will  complete five times sit to stand test in < 15 seconds indicating an increased LE strength and improved balance    Baseline  34.21 sec; 03/24/2018: 22.9 sec    Time  12    Period  Weeks    Status  On-going    Target Date  04/25/18      PT LONG TERM GOAL #3   Title  Patient will increase six minute walk test distance to >1000 for progression to community ambulator and improve gait ability    Baseline  550 feet; 03/24/18: 795 ft.;     Time  12    Period  Weeks    Status  On-going    Target Date  04/25/18      PT LONG TERM GOAL #4   Title  Patient will increase 10 meter walk test to >1.46m/s as to improve gait speed for better community ambulation and to reduce fall risk    Baseline  .49 m/sec; 03/24/18: 1.01 m/sec;    Time  12    Period  Weeks    Status  Achieved    Target Date  04/25/18            Plan - 04/18/18 1155    Clinical Impression Statement  Patient tolerated therapy session well. Instructed pt in functional strengthening with sit <> stand practice as well as LE strengthening. Pt continues to demonstrate increased fatigue with sit <> stand practice with VCs for proper technique and controlling descent to sit. Pt continues to express challenge with leg press but no increases in pain. Instructed patient in balance exercises including tandem stance with head turns and SLS activities. Pt required CGA-min A and VCs for proper technique and sequencing. Pt experienced one LOB when performing balloon passes but self-corrected using // bar. Pt will continue to benefit from skilled PT intervention for improvements in balance, strength, and gait safety.     Rehab  Potential  Fair    PT Frequency  2x / week    PT Duration  12 weeks    PT Treatment/Interventions  Patient/family education;Neuromuscular re-education;Balance training;Stair training;Therapeutic activities;Therapeutic exercise;Gait training;Aquatic Therapy    PT Next Visit Plan  balance and strengthening    Consulted and Agree with Plan of Care  Patient;Family member/caregiver       Patient will benefit from skilled therapeutic intervention in order to improve the following deficits and impairments:  Abnormal gait, Decreased balance, Decreased endurance, Decreased mobility, Impaired flexibility, Decreased strength, Decreased knowledge of use of DME, Decreased activity tolerance, Difficulty walking  Visit Diagnosis: Muscle weakness (generalized)  Difficulty in walking, not elsewhere classified     Problem List Patient Active Problem List   Diagnosis Date Noted  . Cognitive deficit, post-stroke 12/31/2017  . Ataxia, post-stroke 12/31/2017  . Dysarthria, post-stroke   . Gait disturbance, post-stroke   . H/O cerebral parenchymal hemorrhage 11/04/2017  . Essential hypertension 11/04/2017  . Hyperlipidemia 11/04/2017  . Diabetes ([place]) 11/04/2017  . CAD (coronary artery disease) 11/04/2017  . 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 Hypokalemia 11/04/2017  . Right-sided nontraumatic intracerebral hemorrhage of cerebellum ([place])   . History of cervical cancer   . History of TIA (transient ischemic attack)   . Acute systolic congestive heart failure ([place])   . Reactive hypertension   . Hypernatremia   . Leukocytosis   . Acute blood loss anemia   . Elevated serum creatinine   . Acute respiratory failure with hypoxia ([place])   . IVH (intraventricular  hemorrhage) ([place]) 10/29/2017  . Hypoxia 10/28/2017  . Pancreatic lesion 05/24/2017  . 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 Nodule of upper lobe of left lung 06/04/2015  . 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 Cerebral thrombosis with cerebral infarction 04/03/2015  . Cancer of right colon ([place]) 03/28/2015  . CVA (cerebral infarction) 12/21/2014  . Polycythemia vera ([place]) 06/22/2006   Harriet Masson, SPT This entire session was performed under direct supervision and direction of a licensed therapist/therapist assistant . I have personally read, edited and approve of the note as written.  Trotter,Margaret PT, DPT 04/18/2018, 2:40 PM  [place] MAIN [facility] SERVICES [address] [place], [place], [address] Phone: [phone]   Fax:  [phone]  Name: [name] MRN: [id number] Date of Birth: 10-Aug-1931

## 2018-04-20 ENCOUNTER — Other Ambulatory Visit: Payer: Self-pay | Admitting: Urgent Care

## 2018-04-20 ENCOUNTER — Ambulatory Visit: Payer: Medicare Other | Admitting: Physical Therapy

## 2018-04-20 ENCOUNTER — Encounter: Payer: Medicare Other | Admitting: Occupational Therapy

## 2018-04-20 ENCOUNTER — Ambulatory Visit
Admission: RE | Admit: 2018-04-20 | Discharge: 2018-04-20 | Disposition: A | Discharge status: Home / Self Care | Payer: Medicare Other | Source: Ambulatory Visit | Attending: Hematology and Oncology | Admitting: Hematology and Oncology

## 2018-04-20 ENCOUNTER — Other Ambulatory Visit: Payer: Self-pay | Admitting: Hematology and Oncology

## 2018-04-20 DIAGNOSIS — K7689 Other specified diseases of liver: Secondary | ICD-10-CM | POA: Diagnosis not present

## 2018-04-20 DIAGNOSIS — K869 Disease of pancreas, unspecified: Secondary | ICD-10-CM

## 2018-04-20 DIAGNOSIS — K8689 Other specified diseases of pancreas: Secondary | ICD-10-CM | POA: Insufficient documentation

## 2018-04-20 DIAGNOSIS — R7989 Other specified abnormal findings of blood chemistry: Secondary | ICD-10-CM

## 2018-04-20 DIAGNOSIS — N289 Disorder of kidney and ureter, unspecified: Secondary | ICD-10-CM

## 2018-04-20 LAB — POCT I-STAT CREATININE: Creatinine, Ser: 2.8 mg/dL — ABNORMAL HIGH (ref 0.44–1.00)

## 2018-04-21 ENCOUNTER — Inpatient Hospital Stay: Payer: Medicare Other | Attending: Hematology and Oncology

## 2018-04-21 ENCOUNTER — Telehealth: Payer: Self-pay | Admitting: *Deleted

## 2018-04-21 ENCOUNTER — Other Ambulatory Visit: Payer: Self-pay

## 2018-04-21 ENCOUNTER — Ambulatory Visit: Payer: Medicare Other | Admitting: Occupational Therapy

## 2018-04-21 ENCOUNTER — Ambulatory Visit
Admission: RE | Admit: 2018-04-21 | Discharge: 2018-04-21 | Disposition: A | Discharge status: Home / Self Care | Payer: Medicare Other | Source: Ambulatory Visit | Attending: Urgent Care | Admitting: Urgent Care

## 2018-04-21 DIAGNOSIS — D45 Polycythemia vera: Secondary | ICD-10-CM | POA: Insufficient documentation

## 2018-04-21 DIAGNOSIS — R7989 Other specified abnormal findings of blood chemistry: Secondary | ICD-10-CM | POA: Diagnosis not present

## 2018-04-21 DIAGNOSIS — N289 Disorder of kidney and ureter, unspecified: Secondary | ICD-10-CM

## 2018-04-21 DIAGNOSIS — N179 Acute kidney failure, unspecified: Secondary | ICD-10-CM | POA: Diagnosis not present

## 2018-04-21 DIAGNOSIS — R161 Splenomegaly, not elsewhere classified: Secondary | ICD-10-CM | POA: Diagnosis not present

## 2018-04-21 DIAGNOSIS — R935 Abnormal findings on diagnostic imaging of other abdominal regions, including retroperitoneum: Secondary | ICD-10-CM

## 2018-04-21 LAB — URINALYSIS, COMPLETE (UACMP) WITH MICROSCOPIC
Bacteria, UA: NONE SEEN
Bilirubin Urine: NEGATIVE
Glucose, UA: NEGATIVE mg/dL
Hgb urine dipstick: NEGATIVE
Ketones, ur: NEGATIVE mg/dL
Leukocytes, UA: NEGATIVE
Nitrite: NEGATIVE
Protein, ur: NEGATIVE mg/dL
Specific Gravity, Urine: 1.015 (ref 1.005–1.030)
pH: 5 (ref 5.0–8.0)

## 2018-04-21 LAB — CBC WITH DIFFERENTIAL/PLATELET
Abs Immature Granulocytes: 0.04 10*3/uL (ref 0.00–0.07)
Basophils Absolute: 0.1 10*3/uL (ref 0.0–0.1)
Basophils Relative: 2 %
Eosinophils Absolute: 0.2 10*3/uL (ref 0.0–0.5)
Eosinophils Relative: 2 %
HCT: 42.2 % (ref 36.0–46.0)
Hemoglobin: 13.1 g/dL (ref 12.0–15.0)
Immature Granulocytes: 0 %
Lymphocytes Relative: 10 %
Lymphs Abs: 0.9 10*3/uL (ref 0.7–4.0)
MCH: 30.1 pg (ref 26.0–34.0)
MCHC: 31 g/dL (ref 30.0–36.0)
MCV: 97 fL (ref 80.0–100.0)
Monocytes Absolute: 0.7 10*3/uL (ref 0.1–1.0)
Monocytes Relative: 7 %
Neutro Abs: 7.4 10*3/uL (ref 1.7–7.7)
Neutrophils Relative %: 79 %
Platelets: 358 10*3/uL (ref 150–400)
RBC: 4.35 MIL/uL (ref 3.87–5.11)
RDW: 15.9 % — ABNORMAL HIGH (ref 11.5–15.5)
WBC: 9.3 10*3/uL (ref 4.0–10.5)
nRBC: 0 % (ref 0.0–0.2)

## 2018-04-21 LAB — COMPREHENSIVE METABOLIC PANEL
ALT: 14 U/L (ref 0–44)
AST: 17 U/L (ref 15–41)
Albumin: 3.9 g/dL (ref 3.5–5.0)
Alkaline Phosphatase: 37 U/L — ABNORMAL LOW (ref 38–126)
Anion gap: 9 (ref 5–15)
BUN: 36 mg/dL — ABNORMAL HIGH (ref 8–23)
CO2: 29 mmol/L (ref 22–32)
Calcium: 9.7 mg/dL (ref 8.9–10.3)
Chloride: 104 mmol/L (ref 98–111)
Creatinine, Ser: 1.11 mg/dL — ABNORMAL HIGH (ref 0.44–1.00)
GFR calc Af Amer: 51 mL/min — ABNORMAL LOW (ref >60)
GFR calc non Af Amer: 44 mL/min — ABNORMAL LOW (ref >60)
Glucose, Bld: 131 mg/dL — ABNORMAL HIGH (ref 70–99)
Potassium: 4.3 mmol/L (ref 3.5–5.1)
Sodium: 142 mmol/L (ref 135–145)
Total Bilirubin: 0.7 mg/dL (ref 0.3–1.2)
Total Protein: 6.8 g/dL (ref 6.5–8.1)

## 2018-04-21 NOTE — Telephone Encounter (Addendum)
Reviewed MRI, renal ultrasound, and labs from today. Creatinine has returned to baseline. Question lab error from yesterday. However, in review of the MRI, there were concerning findings in the liver. Radiologist is recommending a PET scan. Orders entered and financials is in the process of obtaining approval.

## 2018-04-21 NOTE — Telephone Encounter (Signed)
Addended report:  Addendum   ADDENDUM REPORT: 04/21/2018 09:37  ADDENDUM: A comparison ultrasound is reviewed from 10/18/2006, while this exam is 84 years old, no hepatic lesion is identified therefore this elevates the concern for hepatic metastasis. Recommend ultrasound-guided biopsy of one intermediate intensity liver lesions (i.e. not the probable solitary hemangioma in the RIGHT hepatic lobe). IF biopsy not considered safe, recommend FDG PET-CT scan.  These results will be called to the ordering clinician or representative by the Radiologist Assistant, and communication documented in the PACS or zVision Dashboard.   Electronically Signed   By: Suzy Bouchard M.D.   On: 04/21/2018 09:37

## 2018-04-21 NOTE — Telephone Encounter (Signed)
Called patient's niece Vito Berger to inform her that patient needs to come to [facility] for stat labs.  Based on what we are seeing patient has decreased renal function. Asked if patient has started any new medicines or herbal products - she did start biotin about 2 weeks ago.  She has had no diarrhea - usually has constipation.  Also informed her that based on what labs show she may have to have [place] and go to nephrology.   Collie Siad asked me to call caretaker, Helene Kelp to bring her to clinic  [place] [phone] and asked if she can bring patient to [place] for labs.  She also needs to go to [facility] [address] [place] @ 10:45 for [place].  She will need to have a full bladder but NPO otherwise.

## 2018-04-22 ENCOUNTER — Inpatient Hospital Stay (HOSPITAL_BASED_OUTPATIENT_CLINIC_OR_DEPARTMENT_OTHER): Payer: Medicare Other | Admitting: Hematology and Oncology

## 2018-04-22 ENCOUNTER — Inpatient Hospital Stay: Payer: Medicare Other | Attending: Hematology and Oncology

## 2018-04-22 ENCOUNTER — Encounter: Payer: Self-pay | Admitting: Hematology and Oncology

## 2018-04-22 VITALS — BP 131/81 | HR 63 | Temp 96.8°F | Resp 18 | Wt 107.0 lb

## 2018-04-22 DIAGNOSIS — Z8542 Personal history of malignant neoplasm of other parts of uterus: Secondary | ICD-10-CM | POA: Insufficient documentation

## 2018-04-22 DIAGNOSIS — Z85038 Personal history of other malignant neoplasm of large intestine: Secondary | ICD-10-CM

## 2018-04-22 DIAGNOSIS — D509 Iron deficiency anemia, unspecified: Secondary | ICD-10-CM | POA: Diagnosis not present

## 2018-04-22 DIAGNOSIS — K869 Disease of pancreas, unspecified: Secondary | ICD-10-CM

## 2018-04-22 DIAGNOSIS — Z79899 Other long term (current) drug therapy: Secondary | ICD-10-CM | POA: Insufficient documentation

## 2018-04-22 DIAGNOSIS — K862 Cyst of pancreas: Secondary | ICD-10-CM | POA: Diagnosis not present

## 2018-04-22 DIAGNOSIS — Z7982 Long term (current) use of aspirin: Secondary | ICD-10-CM | POA: Diagnosis not present

## 2018-04-22 DIAGNOSIS — E119 Type 2 diabetes mellitus without complications: Secondary | ICD-10-CM

## 2018-04-22 DIAGNOSIS — Z7189 Other specified counseling: Secondary | ICD-10-CM

## 2018-04-22 DIAGNOSIS — R590 Localized enlarged lymph nodes: Secondary | ICD-10-CM

## 2018-04-22 DIAGNOSIS — Z87891 Personal history of nicotine dependence: Secondary | ICD-10-CM

## 2018-04-22 DIAGNOSIS — D45 Polycythemia vera: Secondary | ICD-10-CM | POA: Insufficient documentation

## 2018-04-22 DIAGNOSIS — I10 Essential (primary) hypertension: Secondary | ICD-10-CM | POA: Diagnosis not present

## 2018-04-22 DIAGNOSIS — Z90710 Acquired absence of both cervix and uterus: Secondary | ICD-10-CM

## 2018-04-22 DIAGNOSIS — Z8673 Personal history of transient ischemic attack (TIA), and cerebral infarction without residual deficits: Secondary | ICD-10-CM | POA: Diagnosis not present

## 2018-04-22 DIAGNOSIS — Z794 Long term (current) use of insulin: Secondary | ICD-10-CM

## 2018-04-22 DIAGNOSIS — K769 Liver disease, unspecified: Secondary | ICD-10-CM | POA: Insufficient documentation

## 2018-04-22 DIAGNOSIS — Z85828 Personal history of other malignant neoplasm of skin: Secondary | ICD-10-CM | POA: Diagnosis not present

## 2018-04-22 DIAGNOSIS — D3A029 Benign carcinoid tumor of the large intestine, unspecified portion: Secondary | ICD-10-CM

## 2018-04-22 NOTE — Progress Notes (Signed)
Patient here today for 3 month follow up.  Patient states she is sleeping more.  Patient accompanied by [name], her niece.

## 2018-04-22 NOTE — Progress Notes (Signed)
[facility] day:  04/22/2018   Chief Complaint: [name] is a 84 y.o. 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  HPI:  The patient was last seen in the medical oncology clinic on 01/24/2018.  At that time, she continued to recover from her MI and cerebellar hemorrhage.  She required assistance with her ADLs.  Hematocrit was 40.8 and platelets 328,000.  We discussed plan to follow-up on the pancreatic lesion.  Abdomen MRI without contrast on 04/20/2018 revealed stable cystic lesion in the tail of the pancreas compared to CT 03/22/2015. Per consensus criteria, as patient is greater than 32 years old with stable lesion follow-up can be halted. There were multiple indeterminate lesions within the liver which were not identified on comparison CT from 04/20/2016. Differential would include focal nodular hyperplasia versus metastatic disease. Further evaluation is warranted and recommended contrast-enhanced MRI liver protocol or PET scan to exclude metastatic lesions.   During the interim, patient has been having low energy. She denies any acute symptoms. Patient denies flushing. No changes to her bowel habits. Peripheral edema has improved. Se is doing well following her MI and ICH. She requires a rolling walker for ambulation assistance. She has a degree of vocal dysfluency (stuttering speech pattern). Patient is working with speech, physical, and occupational therapy.   She complains of an area of prominence to her LEFT lower abdomen/inguinal area. She notes that it is worse when she has to have a bowel movement. Area resolves after her bowel movement. She first appreciated the area of concern about 2-3 weeks ago. No known hernias.   Patient denies that she has experienced any B symptoms. She denies any interval infections. Patient advises that she  maintains an adequate appetite. She is eating well. Weight today is 107 lb (48.5 kg), which compared to her last visit to the clinic, represents a 2 pound decrease.    Patient denies pain in the clinic today.   Past Medical History:  Diagnosis Date  . Adenocarcinoma in situ of cervix   . Arthritis    hands  . Breast cancer ([place])   . Colon cancer ([place])   . Diabetes mellitus without complication ([place])   . Endometrial carcinoma ([facility])    s/p total abdominal hysterectomy  . H/O compression fracture of spine 2014   thoracic spine  . H/O polycythemia vera   . H/O TIA (transient ischemic attack) and stroke 09/2014, 03/2015   No deficits  . Hypertension   . Hyperuricemia   . Microalbuminuria   . Polycythemia vera ([place])   . Recurrent falls   . Skin cancer    face, legs  . Stroke [facility]) 2008   no deficits  . Trochanteric bursitis   . Varicose veins    treated    Past Surgical History:  Procedure Laterality Date  . ABDOMINAL HYSTERECTOMY    . BOWEL RESECTION N/A 03/28/2015   Procedure: SMALL BOWEL RESECTION;  Surgeon: Leonie Green, MD;  Location: [facility] ORS;  Service: General;  Laterality: N/A;  . CATARACT EXTRACTION W/ INTRAOCULAR LENS IMPLANT Right   . CATARACT EXTRACTION W/PHACO Left 10/07/2015   Procedure: CATARACT EXTRACTION PHACO AND INTRAOCULAR LENS PLACEMENT (IOC);  Surgeon: Ronnell Freshwater, MD;  Location: [place];  Service: Ophthalmology;  Laterality: Left;  DIABETIC - oral meds VISION BLUE  . CORONARY ANGIOGRAPHY N/A 10/29/2017  Procedure: CORONARY ANGIOGRAPHY;  Surgeon: Dionisio David, MD;  Location: [place] [facility];  Service: Cardiovascular;  Laterality: N/A;  . EXPLORATORY LAPAROTOMY     for fibroids  . LEFT HEART CATH Right 10/29/2017   Procedure: Left Heart Cath;  Surgeon: Dionisio David, MD;  Location: [place] [facility];  Service: Cardiovascular;  Laterality: Right;  . TONSILLECTOMY      Family History  Problem Relation Age of  Onset  . Cancer Brother        AML  . Heart attack Mother   . Breast cancer Mother   . Heart attack Father   . Heart attack Sister   . Diabetes Sister   Her brother died of AML.  Social History:  reports that she has quit smoking. She has never used smokeless tobacco. She reports that she does not drink alcohol or use drugs.  The patient's husband died of AML.  She has no family in [place]. 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 The patient is accompanied by her niece, [name], who has medical power of attorney, today.  Allergies:  Allergies  Allergen Reactions  . Simvastatin     Other reaction(s): Muscle Pain    Current Medications: Current Outpatient Medications  Medication Sig Dispense Refill  . amLODipine (NORVASC) 2.5 MG tablet Take 1 tablet (2.5 mg total) by mouth daily. 30 tablet 0  . blood glucose meter kit and supplies Dispense based on patient and insurance preference. Use up to four times daily as directed. (FOR ICD-10 E10.9, E11.9). 1 each 0  . calcium-vitamin D (OSCAL WITH D) 500-200 MG-UNIT per tablet Take 1 tablet by mouth daily.     . carvedilol (COREG) 25 MG tablet Take 1 tablet (25 mg total) by mouth 2 (two) times daily with a meal. 60 tablet 0  . clopidogrel (PLAVIX) 75 MG tablet Take 1 tablet (75 mg total) by mouth daily. 90 tablet 4  . docusate sodium (COLACE) 250 MG capsule Take 250 mg by mouth daily as needed for constipation.    . fexofenadine (ALLEGRA ALLERGY) 180 MG tablet Take 1 tablet (180 mg total) by mouth daily.    . hydroxyurea (HYDREA) 500 MG capsule Take 1 tablet daily except on Saturdays 30 capsule 0  . insulin detemir (LEVEMIR) 100 unit/ml SOLN Inject 0.08 mLs (8 Units total) into the skin at bedtime. (Patient taking differently: Inject 10 Units into the skin at bedtime. ) 8 mL 1  . nitroGLYCERIN (NITRODUR - DOSED IN MG/24 HR) 0.2 mg/hr patch Place 1 patch (0.2 mg total) onto the skin daily.  30 patch 12  . pantoprazole (PROTONIX) 40 MG tablet Take 1 tablet (40 mg total) by mouth daily. 30 tablet 0  . pravastatin (PRAVACHOL) 20 MG tablet Take 1 tablet (20 mg total) by mouth at bedtime. 30 tablet 0  . Probiotic Product (PROBIOTIC DAILY PO) Take 1 tablet by mouth daily.    . ramipril (ALTACE) 10 MG capsule Take 1 capsule (10 mg total) by mouth 2 (two) times daily. (Patient taking differently: Take 5 mg by mouth 2 (two) times daily. ) 60 capsule 0  . sertraline (ZOLOFT) 25 MG tablet Take 25 mg by mouth daily.    Marland Kitchen spironolactone (ALDACTONE) 25 MG tablet Take 1 tablet (25 mg total) by mouth daily. 30 tablet 0   No current facility-administered medications for this visit.     Review of Systems  Constitutional:  Positive for malaise/fatigue and weight loss (down 2 pounds). Negative for diaphoresis and fever.  HENT: Negative.   Eyes: Negative.   Respiratory: Negative for cough, hemoptysis, sputum production and shortness of breath.   Cardiovascular: Positive for leg swelling. Negative for chest pain, palpitations, orthopnea and PND.       Recent NSTEMI. Ischemic CHF. Echo showed LVEF of 25%.  Gastrointestinal: Positive for constipation. Negative for abdominal pain, blood in stool, diarrhea, melena, nausea and vomiting.  Genitourinary: Negative for dysuria, frequency, hematuria and urgency.  Musculoskeletal: Negative for back pain, falls, joint pain and myalgias.       Baker's cyst  Skin: Negative for itching and rash.  Neurological: Positive for speech change (dysarthria). Negative for dizziness, tremors, weakness (generalized) and headaches.       Recent cerebellar ICH. (+) dysmetria. (+) coordination and gait abnormalities. Requires assistance with ADLs.   Endo/Heme/Allergies: Does not bruise/bleed easily.  Psychiatric/Behavioral: Positive for memory loss (poor awareness of physical limitations). Negative for depression and suicidal ideas. The patient is nervous/anxious. The patient  does not have insomnia.   All other systems reviewed and are negative.  Performance status (ECOG): 3  Vital Signs BP 131/81 (BP Location: Left Arm, Patient Position: Sitting)   Pulse 63   Temp (!) 96.8 F (36 C) (Tympanic)   Resp 18   Wt 107 lb (48.5 kg)   BMI 19.57 kg/m   Physical Exam  Constitutional: She is oriented to person, place, and time and well-developed, well-nourished, and in no distress. No distress.  HENT:  Head: Normocephalic and atraumatic.  Mouth/Throat: Oropharynx is clear and moist and mucous membranes are normal. No oropharyngeal exudate.  Eyes: Pupils are equal, round, and reactive to light. Conjunctivae and EOM are normal. No scleral icterus.  Neck: Normal range of motion. Neck supple.  Cardiovascular: Normal rate, regular rhythm, normal heart sounds and intact distal pulses. Exam reveals no gallop and no friction rub.  No murmur heard. Pulmonary/Chest: Effort normal and breath sounds normal. No respiratory distress. She has no wheezes. She has no rales.  Abdominal: Soft. Bowel sounds are normal. She exhibits no distension and no mass. There is no abdominal tenderness. There is no rebound and no guarding.  Musculoskeletal: Normal range of motion. She exhibits edema. She exhibits no tenderness.  Lymphadenopathy:    She has no cervical adenopathy.    She has no axillary adenopathy.       Right: No inguinal and no supraclavicular adenopathy present.       Left: No inguinal and no supraclavicular adenopathy present.  Neurological: She is alert and oriented to person, place, and time. She displays weakness and abnormal speech (dysarthria). Coordination and gait abnormal.  Skin: Skin is warm and dry. No rash noted. She is not diaphoretic. No erythema.  Psychiatric: Mood and affect normal.  Nursing note and vitals reviewed.   Orders Only on 04/21/2018  Component Date Value Ref Range Status  . Sodium 04/21/2018 142  135 - 145 mmol/L Final  . Potassium 04/21/2018  4.3  3.5 - 5.1 mmol/L Final  . Chloride 04/21/2018 104  98 - 111 mmol/L Final  . CO2 04/21/2018 29  22 - 32 mmol/L Final  . Glucose, Bld 04/21/2018 131* 70 - 99 mg/dL Final  . BUN 04/21/2018 36* 8 - 23 mg/dL Final  . Creatinine, Ser 04/21/2018 1.11* 0.44 - 1.00 mg/dL Final  . Calcium 04/21/2018 9.7  8.9 - 10.3 mg/dL Final  . Total Protein 04/21/2018 6.8  6.5 -  8.1 g/dL Final  . Albumin 04/21/2018 3.9  3.5 - 5.0 g/dL Final  . AST 04/21/2018 17  15 - 41 U/L Final  . ALT 04/21/2018 14  0 - 44 U/L Final  . Alkaline Phosphatase 04/21/2018 37* 38 - 126 U/L Final  . Total Bilirubin 04/21/2018 0.7  0.3 - 1.2 mg/dL Final  . GFR calc non Af Amer 04/21/2018 44* >60 mL/min Final  . GFR calc Af Amer 04/21/2018 51* >60 mL/min Final   Comment: (NOTE) The eGFR has been calculated using the CKD EPI equation. This calculation has not been validated in all clinical situations. eGFR's persistently <60 mL/min signify possible Chronic Kidney Disease.   Georgiann Hahn gap 04/21/2018 9  5 - 15 Final   Performed at [facility], [address] [place], [place] [address]  . WBC 04/21/2018 9.3  4.0 - 10.5 K/uL Final  . RBC 04/21/2018 4.35  3.87 - 5.11 MIL/uL Final  . Hemoglobin 04/21/2018 13.1  12.0 - 15.0 g/dL Final  . HCT 04/21/2018 42.2  36.0 - 46.0 % Final  . MCV 04/21/2018 97.0  80.0 - 100.0 fL Final  . MCH 04/21/2018 30.1  26.0 - 34.0 pg Final  . MCHC 04/21/2018 31.0  30.0 - 36.0 g/dL Final  . RDW 04/21/2018 15.9* 11.5 - 15.5 % Final  . Platelets 04/21/2018 358  150 - 400 K/uL Final  . nRBC 04/21/2018 0.0  0.0 - 0.2 % Final  . Neutrophils Relative % 04/21/2018 79  % Final  . Neutro Abs 04/21/2018 7.4  1.7 - 7.7 K/uL Final  . Lymphocytes Relative 04/21/2018 10  % Final  . Lymphs Abs 04/21/2018 0.9  0.7 - 4.0 K/uL Final  . Monocytes Relative 04/21/2018 7  % Final  . Monocytes Absolute 04/21/2018 0.7  0.1 - 1.0 K/uL Final  . Eosinophils Relative 04/21/2018 2  % Final  . Eosinophils Absolute  04/21/2018 0.2  0.0 - 0.5 K/uL Final  . Basophils Relative 04/21/2018 2  % Final  . Basophils Absolute 04/21/2018 0.1  0.0 - 0.1 K/uL Final  . Immature Granulocytes 04/21/2018 0  % Final  . Abs Immature Granulocytes 04/21/2018 0.04  0.00 - 0.07 K/uL Final   Performed at [facility], [address] [place], [place] [address]  . Color, Urine 04/21/2018 YELLOW* YELLOW Final  . APPearance 04/21/2018 CLEAR* CLEAR Final  . Specific Gravity, Urine 04/21/2018 1.015  1.005 - 1.030 Final  . pH 04/21/2018 5.0  5.0 - 8.0 Final  . Glucose, UA 04/21/2018 NEGATIVE  NEGATIVE mg/dL Final  . Hgb urine dipstick 04/21/2018 NEGATIVE  NEGATIVE Final  . Bilirubin Urine 04/21/2018 NEGATIVE  NEGATIVE Final  . Ketones, ur 04/21/2018 NEGATIVE  NEGATIVE mg/dL Final  . Protein, ur 04/21/2018 NEGATIVE  NEGATIVE mg/dL Final  . Nitrite 04/21/2018 NEGATIVE  NEGATIVE Final  . Leukocytes, UA 04/21/2018 NEGATIVE  NEGATIVE Final  . RBC / HPF 04/21/2018 0-5  0 - 5 RBC/hpf Final  . WBC, UA 04/21/2018 0-5  0 - 5 WBC/hpf Final  . Bacteria, UA 04/21/2018 NONE SEEN  NONE SEEN Final  . Squamous Epithelial / LPF 04/21/2018 0-5  0 - 5 Final  . Mucus 04/21/2018 PRESENT   Final   Performed at [facility], [address], [place], [place] [address]  Hospital Outpatient Visit on 04/20/2018  Component Date Value Ref Range Status  . Creatinine, Ser 04/20/2018 2.80* 0.44 - 1.00 mg/dL Final     Assessment:  [name]  is a 84 y.o. female with JAK2+ polycythemia rubra vera (PV) diagnosed in 2008. She has undergone low phlebotomies x 2-3 to maintain a target hematocrit is 40-42.5.   She is on hydroxyurea 500 mg (1 pill) on Mondays, Wednesdays, Fridays and Saturdays (5 pills/week). Screening for von Willebrand and a platelet function was normal. She is on a baby aspirin. She has been on Plavix since her TIA/CVA.   She has a history of iron deficiency anemia status post GI evaluation. Colonoscopy in 07/2007 was  negative. For persistent recurrent iron deficiency anemia, she underwent EGD and colonoscopy in 12/2011 and 09/11/2013.  She has a history of carcinoid tumor after presenting with a partial small bowel obstruction.  Abdominal and pelvic CT scan on 03/22/2015 revealed a 3 x 4.9 cm apple core lesion involving a loop of small bowel in the right mid abdomen. There was associated 4 x 1.8 cm adjacent mesenteric implant. Ileocolectomy on 03/28/2015 revealed a grade I neuroendocrine tumor (carcinoid) involving the terminal ileum and right colon.  Largest tumor was 2.5 cm. There were multiple adjacent nodules of a neuroendocrine tumor. Metastasis was in 9 of 12 lymph nodes.  Pathologic stage was T3N1.  Chromogranin was 4 on 05/07/2015, 3 on 04/13/2016, 4 on 11/09/2016, and 10 on 01/24/2018.   24 hour urine for 5HIAA was 5.2 mg/24 hours (0-14.9).  Octreotide scan on 05/31/2015 revealed no evidence of metastatic disease.  There was an indeterminate 9 mm nodule in the left apex.    Chest CT on 06/11/2015 revealed an 8 mm nodular density (spiculated on axial images) in the left apex. Chest CT on 10/10/2015 revealed a stable 8 mm pulmonary nodule in the anterior left lung apex.  Chest, abdomen and pelvic CT scan on 04/20/2016 revealed no definite evidence of recurrent or metastatic carcinoid tumor. There are no acute findings. There was a stable 8 mm pulmonary nodule in the left apex recommendations (follow-up CT in 12 months). 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 Recommendation was continued attention on follow-up or follow-up with abdominal MRI with and without contrast in 2 years.  Chest CT angiogram on 10/28/2017 revealed a stable 8 mm irregular density at the LEFT lung apex, probable scar.  She had shingles involving the right S1/S2 dermatome in 05/2017.    She was admitted to [facility] from 10/29/2017 -  11/16/2017.  She had a NSTEMI and underwent cardiac cath which showed three-vessel disease and severe LV dysfunction.  EF was 25%.  CABG was felt high risk.  Post cardiac cath, head CT showed a RIGHT cerebellar parenchymal hemorrhage felt to be secondary to hypertensive etiology on dual antiplatelet therapy. She was scheduled for PCI with stent placement on 01/13/2018, however after second option with Dr. Clayborn Bigness, PCI was deferred.  She is on aspirin and Plavix.  Symptomatically, patient is doing well following her recent NSTEMI and cerebellar ICH. She has assistance with her ADLs. No B symptoms or recent infections. Exam is unchanged.   Plan: 1. Labs today:  CBC with diff, CMP 2. Acute renal insufficiency  Review labs from 04/20/2018.   I-STAT creatinine prior to MRI resulted a creatinine of 2.80 mg/dL.  Found to be a FALSE result following further testing.   Labs rechecked on 04/21/2018  BUN and creatinine 1.11 mg/dL (CrCl 28.9 mL/min).  CBC and urinalysis unremarkable.  Review renal ultrasound  No evidence of hydronephrosis or specific renal sonographic  abnormality.  Spleen mildly enlarged (646 cm). 3. Pancreatic cystic lesion  Review abdominal MRI. Imaging personally reviewed and felt to be consistent with the dictated radiology report. Imaging reviewed with patient.   Stable 16 x 11 mm cystic lesion noted in the tail of the pancreas. 4. Hepatic lesions on MRI  Multiple indeterminate lesions within the liver on recent MRI imaging of the abdomen.   Discuss recommendation for further evaluation of hepatic lesions via biopsy and/or PET scan.   Patient opting for least invasive approach to further evaluation.    Schedule for PET imaging. 5. JAK2+ MPN (PV)  Labs from 04/21/2018 reviewed.  Platelet count stable at 358,000. Goal is </= 400,000.  Hematocrit stable at 42.2. Goal is </= 42.  Continues on hydroxyurea 500 mg x 5 pills/week.  6. Carcinoid tumor of the  terminal ileum  Last chromogranin A level on 01/24/2018 elevated at 10 nmol/L.  PET scan will further assess.   Consider 24-hour urine for 5-HIAA 7. RTC after PET scan for MD assessment and review of imaging.   Honor Loh, NP 04/22/2018, 3:37 PM   I saw and evaluated the patient, participating in the key portions of the service and reviewing pertinent diagnostic studies and records.  I reviewed the nurse practitioner's note and agree with the findings and the plan.  The assessment and plan were discussed with the patient.  Additional diagnostic studies of PET are needed to clarify hepatic lesions and would change the clinical management.  Several questions were asked by the patient and answered.   Nolon Stalls, MD 04/22/2018,3:37 PM

## 2018-04-25 ENCOUNTER — Ambulatory Visit: Payer: Medicare Other | Admitting: Physical Therapy

## 2018-04-25 ENCOUNTER — Ambulatory Visit: Payer: Medicare Other | Attending: Physical Medicine & Rehabilitation | Admitting: Occupational Therapy

## 2018-04-25 ENCOUNTER — Encounter: Payer: Self-pay | Admitting: Physical Therapy

## 2018-04-25 ENCOUNTER — Encounter: Payer: Self-pay | Admitting: Occupational Therapy

## 2018-04-25 DIAGNOSIS — M6281 Muscle weakness (generalized): Secondary | ICD-10-CM | POA: Insufficient documentation

## 2018-04-25 DIAGNOSIS — R278 Other lack of coordination: Secondary | ICD-10-CM | POA: Diagnosis not present

## 2018-04-25 DIAGNOSIS — R262 Difficulty in walking, not elsewhere classified: Secondary | ICD-10-CM | POA: Diagnosis present

## 2018-04-25 NOTE — Therapy (Addendum)
[place] MAIN [facility] SERVICES [address] [place], [place], [address] Phone: [phone]   Fax:  [phone]  Occupational Therapy Treatment  Patient Details  Name: [name] MRN: [id number] Date of Birth: Mar 24, 1932 No data recorded  Encounter Date: 04/25/2018  OT End of Session - 04/25/18 1405    Visit Number  15    Number of Visits  24    Date for OT Re-Evaluation  06/20/18    Authorization Type  Medicare    Authorization Time Period  visit 5/10 for reporting period starting 04/07/2018    OT Start Time  1100    OT Stop Time  1145    OT Time Calculation (min)  45 min    Activity Tolerance  Patient tolerated treatment well    Behavior During Therapy  [facility] for tasks assessed/performed       Past Medical History:  Diagnosis Date  . Adenocarcinoma in situ of cervix   . Arthritis    hands  . Breast cancer ([place])   . Colon cancer ([place])   . Diabetes mellitus without complication ([place])   . Endometrial carcinoma (HCC)    s/p total abdominal hysterectomy  . H/O compression fracture of spine 2014   thoracic spine  . H/O polycythemia vera   . H/O TIA (transient ischemic attack) and stroke 09/2014, 03/2015   No deficits  . Hypertension   . Hyperuricemia   . Microalbuminuria   . Polycythemia vera ([place])   . Recurrent falls   . Skin cancer    face, legs  . Stroke [facility]) 2008   no deficits  . Trochanteric bursitis   . Varicose veins    treated    Past Surgical History:  Procedure Laterality Date  . ABDOMINAL HYSTERECTOMY    . BOWEL RESECTION N/A 03/28/2015   Procedure: SMALL BOWEL RESECTION;  Surgeon: Leonie Green, MD;  Location: [facility] ORS;  Service: General;  Laterality: N/A;  . CATARACT EXTRACTION W/ INTRAOCULAR LENS IMPLANT Right   . CATARACT EXTRACTION W/PHACO Left 10/07/2015   Procedure: CATARACT EXTRACTION PHACO AND INTRAOCULAR LENS PLACEMENT (IOC);  Surgeon: Ronnell Freshwater, MD;  Location: [place];   Service: Ophthalmology;  Laterality: Left;  DIABETIC - oral meds VISION BLUE  . CORONARY ANGIOGRAPHY N/A 10/29/2017   Procedure: CORONARY ANGIOGRAPHY;  Surgeon: Dionisio David, MD;  Location: [place] CV LAB;  Service: Cardiovascular;  Laterality: N/A;  . EXPLORATORY LAPAROTOMY     for fibroids  . LEFT HEART CATH Right 10/29/2017   Procedure: Left Heart Cath;  Surgeon: Dionisio David, MD;  Location: [place] CV LAB;  Service: Cardiovascular;  Laterality: Right;  . TONSILLECTOMY      There were no vitals filed for this visit.  Subjective Assessment - 04/25/18 1403    Subjective   Pt reports having a variety of appointments since her last visit and being concerned with her new medical findings.    Pertinent History  84 year old female with adenocarcinoma of the cervix as well as endometrial carcinoma and polycythemia vera as well as diabetes who had hypertensive right cerebellar hemorrhage onset 10/28/2017.  She was treated initially at [facility] and then transferred to [facility].  Neurosurgical evaluation per Dr. Ronnald Ramp concluded no surgery was needed.  The patient completed inpatient rehabilitation at [facility] (including SLP) 11/04/2017-11/18/2107 and was discharged at a 24-hour supervision level.  The patient has hired caregiver to provide 24-hour supervision. Patient has  received home health rehab services, including SLP.     Patient Stated Goals  Patient would like to be independent in all tasks and be able to live alone again.    Currently in Pain?  No/denies    Pain Score  0-No pain      OT TREATMENT   Neuromuscular re-education:  Pt. performed [facility] skills training to improve speed and dexterity needed for ADL tasks and writing. Pt worked on storing and translating flat marbles in her hand. Pt was able to store x3 marbles in her hand and translate them to her fingertips before placing in container. Pt frequently dropped marbles today when translating  marbles while storing marbles.   Self-care/IADLs:    Pt. completed check writing task with verbal cues and increased time. Pt used a standard pen with a larger grip during the task. Pt completed three checks while writing in cursive. Pt demonstrated decreased legibility (~50%). Pt noted that the pen that she was using felt better in her hand today. Pt completed check register task. Pt required increased cuing to complete task. Pt reported that she was not familiar with the layout of the check register. Pt demonstrated decreased legibility and greater difficulty writing in the small block on the check register worksheet.                     OT Education - 04/25/18 1404    Education Details  [place] skills, handwriting    Person(s) Educated  Patient    Methods  Explanation;Demonstration;Verbal cues    Comprehension  Verbalized understanding;Returned demonstration;Verbal cues required          OT Long Term Goals - 03/28/18 1412      OT LONG TERM GOAL #1   Title  Patient will complete bathing with modified independence    Baseline  Pt. continues to require ModA    Time  12    Period  Weeks    Status  On-going    Target Date  06/20/18      OT LONG TERM GOAL #2   Title  Patient will complete dressing skills with modified independence    Baseline  moderate assist    Time  12    Period  Weeks    Status  On-going    Target Date  06/20/18      OT LONG TERM GOAL #3   Title  Patient will complete light meal prep with min assist    Baseline  max assist    Time  12    Period  Weeks    Status  On-going    Target Date  06/20/18      OT LONG TERM GOAL #4   Title  Patient will increase R grip strength by 5# to open jars and containers with modified independence    Baseline  Pt. conitnues to have difficulaty opening jars, and containers.    Time  12    Period  Weeks    Status  On-going    Target Date  06/20/18      OT LONG TERM GOAL #5   Title  Patient will improve  strength by 1 mm grade RUE to assist with obtaining items from the closet.     Baseline  4/5 overall RUE. Pt. conitnues to have difficulty obtaining items from the closet.    Time  8    Period  Weeks    Target Date  06/20/18  OT LONG TERM GOAL #6   Title  Pt will increase handwriting legibility and speed to 50% while writing a 3 sentence paragraph in order to write thank you notes and birthday cards for friends and family    Baseline  03/14/2018:  25% legibility to writie a sentence. 50% legibility for her name    Time  4    Period  Weeks    Status  On-going    Target Date  06/20/18            Plan - 04/25/18 1405    Clinical Impression Statement Pt continues to present with decreased right hand [facility] skills and handwriting legibility. Pt continues to make progress with in-hand translatory movements to move small objects through her hand. Pt continues to work with objects of various shapes, sizes, and weights. Pt demonstrates decreased [place] when translating items from her palm to her fingertips while storing other objects. Pt to continue to work on [facility] skills and handwriting to increase independence during ADLs and IADLs.    Occupational Profile and client history currently impacting functional performance  family more than 30 mins away, has 24 hour caregiver assist, decreased awareness, memory and safety    Occupational performance deficits (Please refer to evaluation for details):  ADL's;IADL's;Social Participation    Rehab Potential  Good    Current Impairments/barriers affecting progress:  memory, requires 24 hour caregivers,     OT Frequency  2x / week    OT Duration  12 weeks    OT Treatment/Interventions  Self-care/ADL training;Cryotherapy;Therapeutic exercise;DME and/or AE instruction;Functional Mobility Training;Cognitive remediation/compensation;Balance training;Neuromuscular education;Manual Therapy;Moist Heat;Therapeutic activities;Patient/family education    Clinical  Decision Making  Several treatment options, min-mod task modification necessary    Consulted and Agree with Plan of Care  Patient    Family Member Consulted  caregiver       Patient will benefit from skilled therapeutic intervention in order to improve the following deficits and impairments:  Decreased balance, Decreased mobility, Difficulty walking, Decreased cognition, Decreased activity tolerance, Decreased coordination, Decreased safety awareness, Decreased strength, Impaired UE functional use  Visit Diagnosis: Other lack of coordination  Muscle weakness (generalized)    Problem List Patient Active Problem List   Diagnosis Date Noted  . Liver lesion 04/22/2018  . Goals of care, counseling/discussion 04/22/2018  . Cognitive deficit, post-stroke 12/31/2017  . Ataxia, post-stroke 12/31/2017  . Dysarthria, post-stroke   . Gait disturbance, post-stroke   . H/O cerebral parenchymal hemorrhage 11/04/2017  . Essential hypertension 11/04/2017  . Hyperlipidemia 11/04/2017  . Diabetes ([place]) 11/04/2017  . CAD (coronary artery disease) 11/04/2017  . 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 Hypokalemia 11/04/2017  . Right-sided nontraumatic intracerebral hemorrhage of cerebellum ([place])   . History of cervical cancer   . History of TIA (transient ischemic attack)   . Acute systolic congestive heart failure ([place])   . Reactive hypertension   . Hypernatremia   . Leukocytosis   . Acute blood loss anemia   . Elevated serum creatinine   . Acute respiratory failure with hypoxia ([place])   . IVH (intraventricular hemorrhage) ([place]) 10/29/2017  . Hypoxia 10/28/2017  . Pancreatic lesion 05/24/2017  . 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 Nodule of upper lobe of left lung 06/04/2015  . 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 Cerebral thrombosis with cerebral infarction 04/03/2015  . Cancer of right colon ([place]) 03/28/2015  . CVA (cerebral infarction) 12/21/2014  .  Polycythemia vera ([place])  06/22/2006    Oliver Hum, OTS 04/25/2018, 2:09 PM   This entire session was performed under direct supervision and direction of a licensed therapist/therapist assistant . I have personally read, edited and approve of the note as written.  Harrel Carina, MS, OTR/L   [place] MAIN [facility] SERVICES [address] [place], [place], [address] Phone: [phone]   Fax:  [phone]  Name: [name] MRN: [id number] Date of Birth: 01-May-1932

## 2018-04-25 NOTE — Therapy (Addendum)
[place] MAIN [facility] SERVICES [address] [place], [place], [address] Phone: [phone]   Fax:  [phone]  Physical Therapy Treatment/Progress Note   Dates of reporting period  03/24/18   to   04/25/18  Patient Details  Name: [name] MRN: [id number] Date of Birth: 03/06/1932 Referring Provider (PT): Charlett Blake   Encounter Date: 04/25/2018  PT End of Session - 04/25/18 1153    Visit Number  13    Number of Visits  25    Date for PT Re-Evaluation  06/06/18    Authorization Type  10/10, last goals 04/25/18, start of care 01/31/18    PT Start Time  1145    PT Stop Time  1230    PT Time Calculation (min)  45 min    Equipment Utilized During Treatment  Gait belt    Activity Tolerance  Patient tolerated treatment well    Behavior During Therapy  WFL for tasks assessed/performed       Past Medical History:  Diagnosis Date  . Adenocarcinoma in situ of cervix   . Arthritis    hands  . Breast cancer ([place])   . Colon cancer ([place])   . Diabetes mellitus without complication ([place])   . Endometrial carcinoma (HCC)    s/p total abdominal hysterectomy  . H/O compression fracture of spine 2014   thoracic spine  . H/O polycythemia vera   . H/O TIA (transient ischemic attack) and stroke 09/2014, 03/2015   No deficits  . Hypertension   . Hyperuricemia   . Microalbuminuria   . Polycythemia vera ([place])   . Recurrent falls   . Skin cancer    face, legs  . Stroke [facility]) 2008   no deficits  . Trochanteric bursitis   . Varicose veins    treated    Past Surgical History:  Procedure Laterality Date  . ABDOMINAL HYSTERECTOMY    . BOWEL RESECTION N/A 03/28/2015   Procedure: SMALL BOWEL RESECTION;  Surgeon: Leonie Green, MD;  Location: [facility] ORS;  Service: General;  Laterality: N/A;  . CATARACT EXTRACTION W/ INTRAOCULAR LENS IMPLANT Right   . CATARACT EXTRACTION W/PHACO Left 10/07/2015   Procedure: CATARACT EXTRACTION PHACO AND  INTRAOCULAR LENS PLACEMENT (IOC);  Surgeon: Ronnell Freshwater, MD;  Location: [place];  Service: Ophthalmology;  Laterality: Left;  DIABETIC - oral meds VISION BLUE  . CORONARY ANGIOGRAPHY N/A 10/29/2017   Procedure: CORONARY ANGIOGRAPHY;  Surgeon: Dionisio David, MD;  Location: [place] CV LAB;  Service: Cardiovascular;  Laterality: N/A;  . EXPLORATORY LAPAROTOMY     for fibroids  . LEFT HEART CATH Right 10/29/2017   Procedure: Left Heart Cath;  Surgeon: Dionisio David, MD;  Location: [place] CV LAB;  Service: Cardiovascular;  Laterality: Right;  . TONSILLECTOMY      There were no vitals filed for this visit.  Subjective Assessment - 04/25/18 1152    Subjective  Patient reports she is good; denies any pain and new falls since last visit. She had a MRI last week and had to not eat/drink prior to scan; did not enjoy it.     Pertinent History    84 year old female with adenocarcinoma of the cervix as well as endometrial carcinoma and polycythemia vera as well as diabetes who had hypertensive right cerebellar hemorrhage onset 10/28/2017.  She was treated initially at [facility] and then transferred to [facility].  Neurosurgical evaluation per Dr. Ronnald Ramp  concluded no surgery was needed.  The patient completed inpatient rehabilitation at [facility] (including SLP) 11/04/2017-11/18/2107 and was discharged at a 24-hour supervision level.  The patient has hired caregiver to provide 24-hour supervision. Patient has received home health rehab services, including SLP.    Limitations  Standing;Walking    How long can you walk comfortably?  a few minutes    Patient Stated Goals  Patient wants to walk better and not need the RW.     Currently in Pain?  No/denies        Treatment Warm up on Nustep level 2 x4 min; VCs to maintain SPM above 50 for improved cardiovascular challenge  Assessed TUG, 5xSTS, and 6MWT; improvements in all outcome measures with pt  reporting noticing she is getting stronger   Leg press: BLE 60# 15 reps x2 sets with rest break between sets; VCs for slowing eccentric return and performing movements slowly;  Leg press; heel raises 60# x20 reps with BLE; VCs for proper foot positioning and technique for exercise                    PT Education - 04/25/18 1153    Education Details  goal progression, plan of care    Person(s) Educated  Patient    Methods  Explanation    Comprehension  Verbalized understanding       PT Short Term Goals - 04/25/18 1158      PT SHORT TERM GOAL #1   Title  Patient will reduce timed up and go to <11 seconds to reduce fall risk and demonstrate improved transfer/gait ability.    Baseline  22.07 sec; 03/24/18: 14.2sec; 04/25/18: 14.0 sec with RW    Time  2    Period  Weeks    Status  On-going    Target Date  06/06/18      PT SHORT TERM GOAL #2   Title  Patient will be independent with ascend/descend 2 steps using single UE in step over step pattern without LOB.    Baseline  03/24/18: CGA, single UE support, step-to pattern without LOB; 04/25/18: CGA, single UE support, step-over-step pattern without LOB    Time  2    Period  Weeks    Status  On-going    Target Date  06/06/18        PT Long Term Goals - 04/25/18 1204      PT LONG TERM GOAL #1   Title  Patient will be independent in home exercise program to improve strength/mobility for better functional independence with ADLs.    Baseline  04/25/18: unsure but does not remember doing HEP lately    Time  12    Period  Weeks    Status  On-going    Target Date  06/06/18      PT LONG TERM GOAL #2   Title  Patient (> 9 years old) will complete five times sit to stand test in < 15 seconds indicating an increased LE strength and improved balance    Baseline  34.21 sec; 03/24/2018: 22.9 sec; 04/25/18: 18.5 sec with BUE support    Time  12    Period  Weeks    Status  On-going    Target Date  06/06/18      PT LONG TERM  GOAL #3   Title  Patient will increase six minute walk test distance to >1000 for progression to community ambulator and improve gait ability    Baseline  550 feet; 03/24/18: 795 ft.; 04/25/18: 975 ft with RW    Time  12    Period  Weeks    Status  On-going    Target Date  06/06/18      PT LONG TERM GOAL #4   Title  Patient will increase 10 meter walk test to >1.74m/s as to improve gait speed for better community ambulation and to reduce fall risk    Baseline  .49 m/sec; 03/24/18: 1.01 m/sec;    Time  12    Period  Weeks    Status  Achieved            Plan - 04/25/18 1225    Clinical Impression Statement  Patient tolerated therapy session well. Instructed patient in completing outcome measures; demonstrated improvements in TUG and 5xSTS time as well as 6MWT distance. Pt noted improvements in her LE strength as well as overall mobility. Pt continues to be motivated to work in PT to continue to improve mobility and ambulation. Pt demonstrated increased fatigue following 6MWT; some SOB with difficulty talking in last minute of 6MWT. Pt required seated rest breaks between exercises. Pt complained of "just feeling it" in her R knee with prolonged ambulation; noted "pulling" sensation in L knee while performing BLE on leg press. Pt will continue to benefit from skilled PT intervention with improvements in balance, strength, and gait safety. Patient's condition has the potential to improve in response to therapy. Maximum improvement is yet to be obtained. The anticipated improvement is attainable and reasonable in a generally predictable time.  Patient reports feeling improvements in LE strength as well increased speed with ambulating with RW; continues to want to work on her balance.    Rehab Potential  Fair    PT Frequency  2x / week    PT Duration  6 weeks    PT Treatment/Interventions  Patient/family education;Neuromuscular re-education;Balance training;Stair training;Therapeutic  activities;Therapeutic exercise;Gait training;Aquatic Therapy    PT Next Visit Plan  balance and strengthening    Consulted and Agree with Plan of Care  Patient;Family member/caregiver       Patient will benefit from skilled therapeutic intervention in order to improve the following deficits and impairments:  Abnormal gait, Decreased balance, Decreased endurance, Decreased mobility, Impaired flexibility, Decreased strength, Decreased knowledge of use of DME, Decreased activity tolerance, Difficulty walking  Visit Diagnosis: Muscle weakness (generalized)  Difficulty in walking, not elsewhere classified     Problem List Patient Active Problem List   Diagnosis Date Noted  . Liver lesion 04/22/2018  . Goals of care, counseling/discussion 04/22/2018  . Cognitive deficit, post-stroke 12/31/2017  . Ataxia, post-stroke 12/31/2017  . Dysarthria, post-stroke   . Gait disturbance, post-stroke   . H/O cerebral parenchymal hemorrhage 11/04/2017  . Essential hypertension 11/04/2017  . Hyperlipidemia 11/04/2017  . Diabetes ([place]) 11/04/2017  . CAD (coronary artery disease) 11/04/2017  . 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 Hypokalemia 11/04/2017  . Right-sided nontraumatic intracerebral hemorrhage of cerebellum ([place])   . History of cervical cancer   . History of TIA (transient ischemic attack)   . Acute systolic congestive heart failure ([place])   . Reactive hypertension   . Hypernatremia   . Leukocytosis   . Acute blood loss anemia   . Elevated serum creatinine   . Acute respiratory failure with hypoxia ([place])   . IVH (intraventricular hemorrhage) ([place]) 10/29/2017  . Hypoxia 10/28/2017  . Pancreatic lesion 05/24/2017  . 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 Nodule of  upper lobe of left lung 06/04/2015  . 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 Cerebral thrombosis with cerebral infarction 04/03/2015  . Cancer of right colon ([place]) 03/28/2015  . CVA  (cerebral infarction) 12/21/2014  . Polycythemia vera ([place]) 06/22/2006   Harriet Masson, SPT This entire session was performed under direct supervision and direction of a licensed therapist/therapist assistant . I have personally read, edited and approve of the note as written.   Trotter,Margaret  PT, DPT 04/25/2018, 1:45 PM  [place] MAIN [facility] SERVICES [address] [place], [place], [address] Phone: [phone]   Fax:  [phone]  Name: NARIA ABBEY MRN: [id number] Date of Birth: 1931/08/08

## 2018-04-27 ENCOUNTER — Encounter: Payer: Medicare Other | Admitting: Occupational Therapy

## 2018-04-27 ENCOUNTER — Encounter
Admission: RE | Admit: 2018-04-27 | Discharge: 2018-04-27 | Disposition: A | Discharge status: Home / Self Care | Payer: Medicare Other | Source: Ambulatory Visit | Attending: Urgent Care | Admitting: Urgent Care

## 2018-04-27 ENCOUNTER — Ambulatory Visit: Payer: Medicare Other | Admitting: Physical Therapy

## 2018-04-27 DIAGNOSIS — R935 Abnormal findings on diagnostic imaging of other abdominal regions, including retroperitoneum: Secondary | ICD-10-CM | POA: Insufficient documentation

## 2018-04-27 LAB — GLUCOSE, CAPILLARY: Glucose-Capillary: 105 mg/dL — ABNORMAL HIGH (ref 70–99)

## 2018-04-27 MED ORDER — FLUDEOXYGLUCOSE F - 18 (FDG) INJECTION
5.5000 | Freq: Once | INTRAVENOUS | Status: AC | PRN
  Administered 2018-04-27: 5.72 via INTRAVENOUS

## 2018-04-29 ENCOUNTER — Ambulatory Visit: Payer: Medicare Other | Admitting: Physical Medicine & Rehabilitation

## 2018-05-02 ENCOUNTER — Ambulatory Visit: Payer: Medicare Other | Admitting: Physical Medicine & Rehabilitation

## 2018-05-02 ENCOUNTER — Encounter: Payer: Self-pay | Admitting: Physical Medicine & Rehabilitation

## 2018-05-02 ENCOUNTER — Encounter: Payer: Medicare Other | Attending: Physical Medicine & Rehabilitation

## 2018-05-02 VITALS — BP 128/80 | HR 60 | Resp 14 | Ht 63.0 in | Wt 108.0 lb

## 2018-05-02 DIAGNOSIS — I10 Essential (primary) hypertension: Secondary | ICD-10-CM | POA: Insufficient documentation

## 2018-05-02 DIAGNOSIS — Z7984 Long term (current) use of oral hypoglycemic drugs: Secondary | ICD-10-CM | POA: Insufficient documentation

## 2018-05-02 DIAGNOSIS — E119 Type 2 diabetes mellitus without complications: Secondary | ICD-10-CM | POA: Insufficient documentation

## 2018-05-02 DIAGNOSIS — Z9181 History of falling: Secondary | ICD-10-CM | POA: Diagnosis not present

## 2018-05-02 DIAGNOSIS — I69398 Other sequelae of cerebral infarction: Secondary | ICD-10-CM | POA: Diagnosis not present

## 2018-05-02 DIAGNOSIS — I614 Nontraumatic intracerebral hemorrhage in cerebellum: Secondary | ICD-10-CM

## 2018-05-02 DIAGNOSIS — Z8673 Personal history of transient ischemic attack (TIA), and cerebral infarction without residual deficits: Secondary | ICD-10-CM | POA: Insufficient documentation

## 2018-05-02 DIAGNOSIS — I69319 Unspecified symptoms and signs involving cognitive functions following cerebral infarction: Secondary | ICD-10-CM

## 2018-05-02 DIAGNOSIS — R269 Unspecified abnormalities of gait and mobility: Secondary | ICD-10-CM | POA: Diagnosis not present

## 2018-05-02 DIAGNOSIS — I69393 Ataxia following cerebral infarction: Secondary | ICD-10-CM | POA: Diagnosis not present

## 2018-05-02 DIAGNOSIS — Z87891 Personal history of nicotine dependence: Secondary | ICD-10-CM | POA: Insufficient documentation

## 2018-05-02 NOTE — Progress Notes (Signed)
Subjective:    Patient ID: [name], female    DOB: 1931/09/10, 84 y.o.   MRN: [id number]  HPI  Patient will complete bathing with modified independence    Baseline  Pt. continues to require Levan   04/25/18: CGA, single UE support, step-over-step pattern without LOB  Supposed to use walker but forgets Pt states she walked 35min in therapy  Bathing Mod I using grab bars  Caregiver cooks, and does housekeeping Also gives insulin shots  Has had one fall since last PM&R  Visit, end of August  Pt cannot give a good accounting of her fall No injury reported  Here with her niece Pt has 24 hour care paid through LTC insurance    Pain Inventory Average Pain 2 Pain Right Now 2 My pain is intermittent  In the last 24 hours, has pain interfered with the following? General activity 0 Relation with others 0 Enjoyment of life 0 What TIME of day is your pain at its worst? n/a Sleep (in general) Good  Pain is worse with: some activites Pain improves with: rest Relief from Meds: n/a  Mobility walk with assistance use a walker how many minutes can you walk? 17 ability to climb steps?  yes do you drive?  yes  Function retired I need assistance with the following:  dressing, bathing, meal prep, household duties and shopping  Neuro/Psych weakness trouble walking dizziness  Prior Studies Any changes since last visit?  no  Physicians involved in your care Any changes since last visit?  no   Family History  Problem Relation Age of Onset  . Cancer Brother        AML  . Heart attack Mother   . Breast cancer Mother   . Heart attack Father   . Heart attack Sister   . Diabetes Sister    Social History   Socioeconomic History  . Marital status: Widowed    Spouse name: Not on file  . Number of children: Not on file  . Years of education: Not on file  . Highest education level: Not on file  Occupational History  . Not on file  Social Needs  . Financial  resource strain: Not on file  . Food insecurity:    Worry: Not on file    Inability: Not on file  . Transportation needs:    Medical: Not on file    Non-medical: Not on file  Tobacco Use  . Smoking status: Former Research scientist (life sciences)  . Smokeless tobacco: Never Used  . Tobacco comment: quit 30+ yrs ago  Substance and Sexual Activity  . Alcohol use: No  . Drug use: No  . Sexual activity: Not on file  Lifestyle  . Physical activity:    Days per week: Not on file    Minutes per session: Not on file  . Stress: Not on file  Relationships  . Social connections:    Talks on phone: Not on file    Gets together: Not on file    Attends religious service: Not on file    Active member of club or organization: Not on file    Attends meetings of clubs or organizations: Not on file    Relationship status: Not on file  Other Topics Concern  . Not on file  Social History Narrative  . Not on file   Past Surgical History:  Procedure Laterality Date  . ABDOMINAL HYSTERECTOMY    . BOWEL RESECTION N/A 03/28/2015   Procedure: SMALL  BOWEL RESECTION;  Surgeon: Leonie Green, MD;  Location: [facility];  Service: General;  Laterality: N/A;  . CATARACT EXTRACTION W/ INTRAOCULAR LENS IMPLANT Right   . CATARACT EXTRACTION W/PHACO Left 10/07/2015   Procedure: CATARACT EXTRACTION PHACO AND INTRAOCULAR LENS PLACEMENT (IOC);  Surgeon: Ronnell Freshwater, MD;  Location: [place];  Service: Ophthalmology;  Laterality: Left;  DIABETIC - oral meds VISION BLUE  . CORONARY ANGIOGRAPHY N/A 10/29/2017   Procedure: CORONARY ANGIOGRAPHY;  Surgeon: Dionisio David, MD;  Location: [place] [facility];  Service: Cardiovascular;  Laterality: N/A;  . EXPLORATORY LAPAROTOMY     for fibroids  . LEFT HEART CATH Right 10/29/2017   Procedure: Left Heart Cath;  Surgeon: Dionisio David, MD;  Location: [place] [facility];  Service: Cardiovascular;  Laterality: Right;  . TONSILLECTOMY     Past Medical History:    Diagnosis Date  . Adenocarcinoma in situ of cervix   . Arthritis    hands  . Breast cancer ([place])   . Colon cancer ([place])   . Diabetes mellitus without complication ([place])   . Endometrial carcinoma (HCC)    s/p total abdominal hysterectomy  . H/O compression fracture of spine 2014   thoracic spine  . H/O polycythemia vera   . H/O TIA (transient ischemic attack) and stroke 09/2014, 03/2015   No deficits  . Hypertension   . Hyperuricemia   . Microalbuminuria   . Polycythemia vera ([place])   . Recurrent falls   . Skin cancer    face, legs  . Stroke [facility]) 2008   no deficits  . Trochanteric bursitis   . Varicose veins    treated   BP 128/80   Pulse 60   Resp 14   Ht 5\' 3"  (1.6 m)   Wt 108 lb (49 kg)   SpO2 96%   BMI 19.13 kg/m   Opioid Risk Score:   Fall Risk Score:  `1  Depression screen PHQ 2/9  Depression screen PHQ 2/9 11/26/2017  Decreased Interest 1  Down, Depressed, Hopeless 1  PHQ - 2 Score 2  Altered sleeping 1  Tired, decreased energy 3  Change in appetite 0  Feeling bad or failure about yourself  0  Trouble concentrating 3  Moving slowly or fidgety/restless 3  Suicidal thoughts 1  PHQ-9 Score 13  Some recent data might be hidden     Review of Systems  Constitutional: Negative.   HENT: Negative.   Eyes: Negative.   Respiratory: Negative.   Cardiovascular: Negative.   Gastrointestinal: Positive for constipation.  Endocrine: Negative.        High blood sugar  Genitourinary: Negative.   Musculoskeletal: Positive for arthralgias and gait problem.  Skin: Negative.   Allergic/Immunologic: Negative.   Neurological: Positive for dizziness and weakness.  Psychiatric/Behavioral: Negative.        Objective:   Physical Exam  Constitutional: She is oriented to person, place, and time. She appears well-developed and well-nourished. No distress.  HENT:  Head: Normocephalic and atraumatic.  Eyes: Pupils are equal, round, and reactive to light. EOM are  normal.  Neck: Normal range of motion.  Neurological: She is alert and oriented to person, place, and time.  Patient has minimal dysmetria right finger-nose-finger Mild finger to thumb opposition deficit on the right side compared to left Mild dysmetria right heel-to-shin Motor strength is 5/5 bilateral deltoid, bicep, tricep, grip, hip flexor, knee extensor, ankle dorsiflexor Eyes without nystagmus and extraocular motion is intact  No dizziness with neck range of motion Ambulates with a walker no evidence of toe drag or knee instability.  No limb ataxia noted during walking. She can ambulate without a walker in a straight line but she does have a tendency to lose balance on turns. Speech is with moderate dysarthria she has difficulty giving a good history.  Her niece assists her answering questions  Skin: Skin is warm and dry. She is not diaphoretic.  Nursing note and vitals reviewed.         Assessment & Plan:  1.  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  I do think she is plateauing from a physical standpoint. While cerebellar infarcts do not typically cause cognitive deficits, she may have's had some pre-existing cognitive disorder and intracranial edema may have caused some additional issues.  At this point I do think she needs 24-hour supervision She will follow-up with neurology No physical medicine rehab follow-up needed she will be completing her outpatient therapy in the next few weeks

## 2018-05-03 ENCOUNTER — Encounter: Payer: Self-pay | Admitting: Physical Therapy

## 2018-05-03 ENCOUNTER — Ambulatory Visit: Payer: Medicare Other | Admitting: Physical Therapy

## 2018-05-03 ENCOUNTER — Ambulatory Visit: Payer: Medicare Other | Admitting: Occupational Therapy

## 2018-05-03 ENCOUNTER — Encounter: Payer: Self-pay | Admitting: Occupational Therapy

## 2018-05-03 DIAGNOSIS — M6281 Muscle weakness (generalized): Secondary | ICD-10-CM

## 2018-05-03 DIAGNOSIS — R278 Other lack of coordination: Secondary | ICD-10-CM | POA: Diagnosis not present

## 2018-05-03 DIAGNOSIS — R262 Difficulty in walking, not elsewhere classified: Secondary | ICD-10-CM

## 2018-05-03 NOTE — Therapy (Addendum)
[place] MAIN [facility] SERVICES [address] [place], [place], [address] Phone: [phone]   Fax:  [phone]  Occupational Therapy Treatment  Patient Details  Name: [name] MRN: [id number] Date of Birth: 06/19/1932 No data recorded  Encounter Date: 05/03/2018  OT End of Session - 05/03/18 1345    Visit Number  16    Number of Visits  24    Date for OT Re-Evaluation  06/20/18    Authorization Type  Medicare    Authorization Time Period  visit 6/10 for reporting period starting 04/07/2018    OT Start Time  1330    OT Stop Time  1415    OT Time Calculation (min)  45 min    Activity Tolerance  Patient tolerated treatment well    Behavior During Therapy  [facility] for tasks assessed/performed       Past Medical History:  Diagnosis Date  . Adenocarcinoma in situ of cervix   . Arthritis    hands  . Breast cancer ([place])   . Colon cancer ([place])   . Diabetes mellitus without complication ([place])   . Endometrial carcinoma (HCC)    s/p total abdominal hysterectomy  . H/O compression fracture of spine 2014   thoracic spine  . H/O polycythemia vera   . H/O TIA (transient ischemic attack) and stroke 09/2014, 03/2015   No deficits  . Hypertension   . Hyperuricemia   . Microalbuminuria   . Polycythemia vera ([place])   . Recurrent falls   . Skin cancer    face, legs  . Stroke [facility]) 2008   no deficits  . Trochanteric bursitis   . Varicose veins    treated    Past Surgical History:  Procedure Laterality Date  . ABDOMINAL HYSTERECTOMY    . BOWEL RESECTION N/A 03/28/2015   Procedure: SMALL BOWEL RESECTION;  Surgeon: Leonie Green, MD;  Location: [facility];  Service: General;  Laterality: N/A;  . CATARACT EXTRACTION W/ INTRAOCULAR LENS IMPLANT Right   . CATARACT EXTRACTION W/PHACO Left 10/07/2015   Procedure: CATARACT EXTRACTION PHACO AND INTRAOCULAR LENS PLACEMENT (IOC);  Surgeon: Ronnell Freshwater, MD;  Location: [place];  Service: Ophthalmology;  Laterality: Left;  DIABETIC - oral meds VISION BLUE  . CORONARY ANGIOGRAPHY N/A 10/29/2017   Procedure: CORONARY ANGIOGRAPHY;  Surgeon: Dionisio David, MD;  Location: [place] CV LAB;  Service: Cardiovascular;  Laterality: N/A;  . EXPLORATORY LAPAROTOMY     for fibroids  . LEFT HEART CATH Right 10/29/2017   Procedure: Left Heart Cath;  Surgeon: Dionisio David, MD;  Location: [place] [facility];  Service: Cardiovascular;  Laterality: Right;  . TONSILLECTOMY      There were no vitals filed for this visit.  Subjective Assessment - 05/03/18 1342    Subjective   Pt reports having a good day and noticing improvements in her handwriting.    Pertinent History  84 year old female with adenocarcinoma of the cervix as well as endometrial carcinoma and polycythemia vera as well as diabetes who had hypertensive right cerebellar hemorrhage onset 10/28/2017.  She was treated initially at [facility] and then transferred to [facility].  Neurosurgical evaluation per Dr. Ronnald Ramp concluded no surgery was needed.  The patient completed inpatient rehabilitation at [facility] (including SLP) 11/04/2017-11/18/2107 and was discharged at a 24-hour supervision level.  The patient has hired caregiver to provide 24-hour supervision. Patient has received home health rehab services, including SLP.  Patient Stated Goals  Patient would like to be independent in all tasks and be able to live alone again.    Currently in Pain?  No/denies    Pain Score  0-No pain      OT TREATMENT   Self-care:   Pt. worked on Media planner and speed. Pt. was able to print name x5 and sign name x5 with 75% legibility. Pt worked on signing her initials. Pt required increased time and demonstrated 50% legibility. Pt used a standard pen without a grip and unlined paper. Pt. used a mature hand grasp on the pen. Pt. was able to hold the pen for the entire exercise. Pt maintained proper  spacing between letters and lines and did not deviate from the margin. Pt to continue to work to increase writing speed and legibility.  Neuromuscular re-education:   Pt. Performed right hand [facility] skills training to improve speed and dexterity needed for ADL tasks and writing. Pt. demonstrated grasping 1 inch sticks,  inch cylindrical collars, and  inch flat washers on the Purdue pegboard. Pt. performed grasping each item with her 2nd digit and thumb, and storing them in the palm. Pt was unable to store objects her palm without dropping them. Pt required increased time to manipulate collars and washers. Pt worked on right hand translatory movements to move round glass marbles from her palm to her fingertips. Pt required min A and verbal cuing to use thumb to move objects through her hand. Pt dropped x5 marbles during the task.                 OT Education - 05/03/18 1343    Education Details  [place] skills, pinch strengthening, hand writing    Person(s) Educated  Patient    Methods  Explanation;Demonstration;Verbal cues    Comprehension  Verbalized understanding;Returned demonstration;Verbal cues required          OT Long Term Goals - 03/28/18 1412      OT LONG TERM GOAL #1   Title  Patient will complete bathing with modified independence    Baseline  Pt. continues to require ModA    Time  12    Period  Weeks    Status  On-going    Target Date  06/20/18      OT LONG TERM GOAL #2   Title  Patient will complete dressing skills with modified independence    Baseline  moderate assist    Time  12    Period  Weeks    Status  On-going    Target Date  06/20/18      OT LONG TERM GOAL #3   Title  Patient will complete light meal prep with min assist    Baseline  max assist    Time  12    Period  Weeks    Status  On-going    Target Date  06/20/18      OT LONG TERM GOAL #4   Title  Patient will increase R grip strength by 5# to open jars and containers with modified  independence    Baseline  Pt. conitnues to have difficulaty opening jars, and containers.    Time  12    Period  Weeks    Status  On-going    Target Date  06/20/18      OT LONG TERM GOAL #5   Title  Patient will improve strength by 1 mm grade RUE to assist with obtaining items from the closet.  Baseline  4/5 overall RUE. Pt. conitnues to have difficulty obtaining items from the closet.    Time  8    Period  Weeks    Target Date  06/20/18      OT LONG TERM GOAL #6   Title  Pt will increase handwriting legibility and speed to 50% while writing a 3 sentence paragraph in order to write thank you notes and birthday cards for friends and family    Baseline  03/14/2018:  25% legibility to writie a sentence. 50% legibility for her name    Time  4    Period  Weeks    Status  On-going    Target Date  06/20/18            Plan - 05/03/18 1346    Clinical Impression Statement  Pt presents with limitations in right hand [facility] skills, strength, and handwriting legibility. Pt continues to make progress with handwriting legibility and speed. Pt continues to work on in-hand translatory movements to move small objects through her hand and isolating digit movements. Pt to continue to work to improve handwriting legibility and speed when writing and signing name and when writing other words. Continued improvement for handwriting and right hand [facility] skills promote pt's independence during ADLs and IADLs.    Occupational Profile and client history currently impacting functional performance  family more than 30 mins away, has 24 hour caregiver assist, decreased awareness, memory and safety    Occupational performance deficits (Please refer to evaluation for details):  ADL's;IADL's;Social Participation    Rehab Potential  Good    Current Impairments/barriers affecting progress:  memory, requires 24 hour caregivers,     OT Frequency  2x / week    OT Duration  12 weeks    OT Treatment/Interventions   Self-care/ADL training;Cryotherapy;Therapeutic exercise;DME and/or AE instruction;Functional Mobility Training;Cognitive remediation/compensation;Balance training;Neuromuscular education;Manual Therapy;Moist Heat;Therapeutic activities;Patient/family education    Clinical Decision Making  Several treatment options, min-mod task modification necessary    Consulted and Agree with Plan of Care  Patient    Family Member Consulted  caregiver       Patient will benefit from skilled therapeutic intervention in order to improve the following deficits and impairments:  Decreased balance, Decreased mobility, Difficulty walking, Decreased cognition, Decreased activity tolerance, Decreased coordination, Decreased safety awareness, Decreased strength, Impaired UE functional use  Visit Diagnosis: Muscle weakness (generalized)    Problem List Patient Active Problem List   Diagnosis Date Noted  . Liver lesion 04/22/2018  . Goals of care, counseling/discussion 04/22/2018  . Cognitive deficit, post-stroke 12/31/2017  . Ataxia, post-stroke 12/31/2017  . Dysarthria, post-stroke   . Gait disturbance, post-stroke   . H/O cerebral parenchymal hemorrhage 11/04/2017  . Essential hypertension 11/04/2017  . Hyperlipidemia 11/04/2017  . Diabetes ([place]) 11/04/2017  . CAD (coronary artery disease) 11/04/2017  . 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 Hypokalemia 11/04/2017  . Right-sided nontraumatic intracerebral hemorrhage of cerebellum ([place])   . History of cervical cancer   . History of TIA (transient ischemic attack)   . Acute systolic congestive heart failure ([place])   . Reactive hypertension   . Hypernatremia   . Leukocytosis   . Acute blood loss anemia   . Elevated serum creatinine   . Acute respiratory failure with hypoxia ([place])   . IVH (intraventricular hemorrhage) ([place]) 10/29/2017  . Hypoxia 10/28/2017  . Pancreatic lesion 05/24/2017  . 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 Nodule of upper  lobe of left lung  06/04/2015  . 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 Cerebral thrombosis with cerebral infarction 04/03/2015  . Cancer of right colon ([place]) 03/28/2015  . CVA (cerebral infarction) 12/21/2014  . Polycythemia vera ([place]) 06/22/2006    Oliver Hum, OTS 05/03/2018, 2:13 PM   This entire session was performed under direct supervision and direction of a licensed therapist/therapist assistant . I have personally read, edited and approve of the note as written.  Harrel Carina, MS, OTR/L   [place] MAIN [facility] SERVICES [address] [place], [place], [address] Phone: [phone]   Fax:  [phone]  Name: PERL FOLMAR MRN: [id number] Date of Birth: 29-May-1932

## 2018-05-03 NOTE — Therapy (Addendum)
[address] MAIN [facility] SERVICES [address] [place], [place], [address] Phone: [phone]   Fax:  [phone]  Physical Therapy Treatment  Patient Details  Name: [name] MRN: [id number] Date of Birth: October 16, 1931 Referring Provider (PT): Charlett Blake   Encounter Date: 05/03/2018  PT End of Session - 05/03/18 1431    Visit Number  14    Number of Visits  25    Date for PT Re-Evaluation  06/06/18    Authorization Type  1/10, last goals 04/25/18, start of care 01/31/18    PT Start Time  6962    PT Stop Time  1500    PT Time Calculation (min)  45 min    Equipment Utilized During Treatment  Gait belt    Activity Tolerance  Patient tolerated treatment well    Behavior During Therapy  WFL for tasks assessed/performed       Past Medical History:  Diagnosis Date  . Adenocarcinoma in situ of cervix   . Arthritis    hands  . Breast cancer ([place])   . Colon cancer ([place])   . Diabetes mellitus without complication ([place])   . Endometrial carcinoma (HCC)    s/p total abdominal hysterectomy  . H/O compression fracture of spine 2014   thoracic spine  . H/O polycythemia vera   . H/O TIA (transient ischemic attack) and stroke 09/2014, 03/2015   No deficits  . Hypertension   . Hyperuricemia   . Microalbuminuria   . Polycythemia vera ([place])   . Recurrent falls   . Skin cancer    face, legs  . Stroke [facility]) 2008   no deficits  . Trochanteric bursitis   . Varicose veins    treated    Past Surgical History:  Procedure Laterality Date  . ABDOMINAL HYSTERECTOMY    . BOWEL RESECTION N/A 03/28/2015   Procedure: SMALL BOWEL RESECTION;  Surgeon: Leonie Green, MD;  Location: [facility] ORS;  Service: General;  Laterality: N/A;  . CATARACT EXTRACTION W/ INTRAOCULAR LENS IMPLANT Right   . CATARACT EXTRACTION W/PHACO Left 10/07/2015   Procedure: CATARACT EXTRACTION PHACO AND INTRAOCULAR LENS PLACEMENT (IOC);  Surgeon: Ronnell Freshwater,  MD;  Location: [place];  Service: Ophthalmology;  Laterality: Left;  DIABETIC - oral meds VISION BLUE  . CORONARY ANGIOGRAPHY N/A 10/29/2017   Procedure: CORONARY ANGIOGRAPHY;  Surgeon: Dionisio David, MD;  Location: [place] CV LAB;  Service: Cardiovascular;  Laterality: N/A;  . EXPLORATORY LAPAROTOMY     for fibroids  . LEFT HEART CATH Right 10/29/2017   Procedure: Left Heart Cath;  Surgeon: Dionisio David, MD;  Location: [place] CV LAB;  Service: Cardiovascular;  Laterality: Right;  . TONSILLECTOMY      There were no vitals filed for this visit.  Subjective Assessment - 05/03/18 1430    Subjective  Patient reports she is doing well today; no complaints of pain. She says she can feel it in her legs which is worse when cold and rainy but no real pain.     Pertinent History    84 year old female with adenocarcinoma of the cervix as well as endometrial carcinoma and polycythemia vera as well as diabetes who had hypertensive right cerebellar hemorrhage onset 10/28/2017.  She was treated initially at [facility] and then transferred to [facility].  Neurosurgical evaluation per Dr. Ronnald Ramp concluded no surgery was needed.  The patient completed inpatient rehabilitation at [facility] (including SLP)  11/04/2017-11/18/2107 and was discharged at a 24-hour supervision level.  The patient has hired caregiver to provide 24-hour supervision. Patient has received home health rehab services, including SLP.    Limitations  Standing;Walking    How long can you walk comfortably?  a few minutes    Patient Stated Goals  Patient wants to walk better and not need the RW.     Currently in Pain?  No/denies        Treatment Warm up on Nustep level 2 x6 min with VCs to maintain SPM above 60 for better cardiovascular challenge  Standing on airex pad: -Eyes closed x20 sec bouts x2 bouts without UE support, CGA for safety but no LOB noted   1/2 foam roll, flat side  up: -Tandem stance x30 sec holds with each foot in rear, no UE support with CGA-min A for safety and VCs for utilizing ankles to maintain balance  Forward lunges onto BOSU ball x10 reps with each LE, no UE support with VCs for slowing down motion for improved motor control   Standing on BOSU ball, balloon passes x1 min without UE support              PT Education - 05/03/18 1430    Education Details  exercise technique/form, balance    Person(s) Educated  Patient    Methods  Explanation;Demonstration;Verbal cues    Comprehension  Verbalized understanding;Returned demonstration;Verbal cues required;Need further instruction       PT Short Term Goals - 04/25/18 1158      PT SHORT TERM GOAL #1   Title  Patient will reduce timed up and go to <11 seconds to reduce fall risk and demonstrate improved transfer/gait ability.    Baseline  22.07 sec; 03/24/18: 14.2sec; 04/25/18: 14.0 sec with RW    Time  2    Period  Weeks    Status  On-going    Target Date  06/06/18      PT SHORT TERM GOAL #2   Title  Patient will be independent with ascend/descend 2 steps using single UE in step over step pattern without LOB.    Baseline  03/24/18: CGA, single UE support, step-to pattern without LOB; 04/25/18: CGA, single UE support, step-over-step pattern without LOB    Time  2    Period  Weeks    Status  On-going    Target Date  06/06/18        PT Long Term Goals - 04/25/18 1204      PT LONG TERM GOAL #1   Title  Patient will be independent in home exercise program to improve strength/mobility for better functional independence with ADLs.    Baseline  04/25/18: unsure but does not remember doing HEP lately    Time  12    Period  Weeks    Status  On-going    Target Date  06/06/18      PT LONG TERM GOAL #2   Title  Patient (> 77 years old) will complete five times sit to stand test in < 15 seconds indicating an increased LE strength and improved balance    Baseline  34.21 sec; 03/24/2018:  22.9 sec; 04/25/18: 18.5 sec with BUE support    Time  12    Period  Weeks    Status  On-going    Target Date  06/06/18      PT LONG TERM GOAL #3   Title  Patient will increase six minute walk test distance to >  1000 for progression to community ambulator and improve gait ability    Baseline  550 feet; 03/24/18: 795 ft.; 04/25/18: 975 ft with RW    Time  12    Period  Weeks    Status  On-going    Target Date  06/06/18      PT LONG TERM GOAL #4   Title  Patient will increase 10 meter walk test to >1.51m/s as to improve gait speed for better community ambulation and to reduce fall risk    Baseline  .49 m/sec; 03/24/18: 1.01 m/sec;    Time  12    Period  Weeks    Status  Achieved            Plan - 05/03/18 1501    Clinical Impression Statement  Patient tolerated therapy session well. Pt demonstrated improved ability to maintain balance on compliant surface with eyes closed; no UE support required and CGA for safety only. Pt demonstrated difficulty getting balance in tandem stance on 1/2 foam roll but improved postural control after standing for a few seconds; required VCs for utilizing ankles to maintain balance. Pt had difficulty with motor control necessary for forward lunges onto BOSU and required VCs to slow down movements for better control. Pt required frequent seated rest breaks secondary to complaints of fatigue today. Pt worked on turning with RW; required VCs to keep RW on ground when turning and to not lift RW all the way off the ground. Pt will continue to benefit from skilled PT intervention for improvements in balance, strength, and gait safety.    Rehab Potential  Fair    PT Frequency  2x / week    PT Duration  6 weeks    PT Treatment/Interventions  Patient/family education;Neuromuscular re-education;Balance training;Stair training;Therapeutic activities;Therapeutic exercise;Gait training;Aquatic Therapy    PT Next Visit Plan  balance and strengthening    Consulted and Agree  with Plan of Care  Patient;Family member/caregiver       Patient will benefit from skilled therapeutic intervention in order to improve the following deficits and impairments:  Abnormal gait, Decreased balance, Decreased endurance, Decreased mobility, Impaired flexibility, Decreased strength, Decreased knowledge of use of DME, Decreased activity tolerance, Difficulty walking  Visit Diagnosis: Muscle weakness (generalized)  Difficulty in walking, not elsewhere classified     Problem List Patient Active Problem List   Diagnosis Date Noted  . Liver lesion 04/22/2018  . Goals of care, counseling/discussion 04/22/2018  . Cognitive deficit, post-stroke 12/31/2017  . Ataxia, post-stroke 12/31/2017  . Dysarthria, post-stroke   . Gait disturbance, post-stroke   . H/O cerebral parenchymal hemorrhage 11/04/2017  . Essential hypertension 11/04/2017  . Hyperlipidemia 11/04/2017  . Diabetes ([place]) 11/04/2017  . CAD (coronary artery disease) 11/04/2017  . 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 Hypokalemia 11/04/2017  . Right-sided nontraumatic intracerebral hemorrhage of cerebellum ([place])   . History of cervical cancer   . History of TIA (transient ischemic attack)   . Acute systolic congestive heart failure ([place])   . Reactive hypertension   . Hypernatremia   . Leukocytosis   . Acute blood loss anemia   . Elevated serum creatinine   . Acute respiratory failure with hypoxia ([place])   . IVH (intraventricular hemorrhage) ([place]) 10/29/2017  . Hypoxia 10/28/2017  . Pancreatic lesion 05/24/2017  . 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 Nodule of upper lobe of left lung 06/04/2015  . 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 Cerebral thrombosis  with cerebral infarction 04/03/2015  . Cancer of right colon ([place]) 03/28/2015  . CVA (cerebral infarction) 12/21/2014  . Polycythemia vera ([place]) 06/22/2006   Harriet Masson, SPT This entire session was performed under  direct supervision and direction of a licensed therapist/therapist assistant . I have personally read, edited and approve of the note as written.  Trotter,Margaret PT, DPT 05/04/2018, 8:36 AM  [place] MAIN [facility] SERVICES [address] [place], [place], [address] Phone: [phone]   Fax:  [phone]  Name: NUVIA HILEMAN MRN: [id number] Date of Birth: 1932-01-01

## 2018-05-05 ENCOUNTER — Ambulatory Visit: Payer: Medicare Other | Admitting: Physical Therapy

## 2018-05-05 ENCOUNTER — Ambulatory Visit: Payer: Medicare Other | Admitting: Occupational Therapy

## 2018-05-05 ENCOUNTER — Encounter: Payer: Self-pay | Admitting: Physical Therapy

## 2018-05-05 DIAGNOSIS — R262 Difficulty in walking, not elsewhere classified: Secondary | ICD-10-CM

## 2018-05-05 DIAGNOSIS — M6281 Muscle weakness (generalized): Secondary | ICD-10-CM

## 2018-05-05 DIAGNOSIS — R278 Other lack of coordination: Secondary | ICD-10-CM | POA: Diagnosis not present

## 2018-05-05 NOTE — Therapy (Signed)
[place] MAIN [facility] SERVICES [address] [place], [place], [address] Phone: [phone]   Fax:  [phone]  Physical Therapy Treatment  Patient Details  Name: [name] MRN: [id number] Date of Birth: 1932/04/25 Referring Provider (PT): Charlett Blake   Encounter Date: 05/05/2018  PT End of Session - 05/05/18 1044    Visit Number  15    Number of Visits  25    Date for PT Re-Evaluation  06/06/18    Authorization Type  2/10, last goals 04/25/18, start of care 01/31/18    PT Start Time  1100    PT Stop Time  1145    PT Time Calculation (min)  45 min    Equipment Utilized During Treatment  Gait belt    Activity Tolerance  Patient tolerated treatment well    Behavior During Therapy  WFL for tasks assessed/performed       Past Medical History:  Diagnosis Date  . Adenocarcinoma in situ of cervix   . Arthritis    hands  . Breast cancer ([place])   . Colon cancer ([place])   . Diabetes mellitus without complication ([place])   . Endometrial carcinoma (HCC)    s/p total abdominal hysterectomy  . H/O compression fracture of spine 2014   thoracic spine  . H/O polycythemia vera   . H/O TIA (transient ischemic attack) and stroke 09/2014, 03/2015   No deficits  . Hypertension   . Hyperuricemia   . Microalbuminuria   . Polycythemia vera ([place])   . Recurrent falls   . Skin cancer    face, legs  . Stroke [facility]) 2008   no deficits  . Trochanteric bursitis   . Varicose veins    treated    Past Surgical History:  Procedure Laterality Date  . ABDOMINAL HYSTERECTOMY    . BOWEL RESECTION N/A 03/28/2015   Procedure: SMALL BOWEL RESECTION;  Surgeon: Leonie Green, MD;  Location: [facility] ORS;  Service: General;  Laterality: N/A;  . CATARACT EXTRACTION W/ INTRAOCULAR LENS IMPLANT Right   . CATARACT EXTRACTION W/PHACO Left 10/07/2015   Procedure: CATARACT EXTRACTION PHACO AND INTRAOCULAR LENS PLACEMENT (IOC);  Surgeon: Ronnell Freshwater,  MD;  Location: [place];  Service: Ophthalmology;  Laterality: Left;  DIABETIC - oral meds VISION BLUE  . CORONARY ANGIOGRAPHY N/A 10/29/2017   Procedure: CORONARY ANGIOGRAPHY;  Surgeon: Dionisio David, MD;  Location: [place] CV LAB;  Service: Cardiovascular;  Laterality: N/A;  . EXPLORATORY LAPAROTOMY     for fibroids  . LEFT HEART CATH Right 10/29/2017   Procedure: Left Heart Cath;  Surgeon: Dionisio David, MD;  Location: [place] CV LAB;  Service: Cardiovascular;  Laterality: Right;  . TONSILLECTOMY      There were no vitals filed for this visit.  Subjective Assessment - 05/05/18 1110    Subjective  Patient reports doing well; no new complaints; no new falls; reports having trouble with turning when walking.     Pertinent History    84 year old female with adenocarcinoma of the cervix as well as endometrial carcinoma and polycythemia vera as well as diabetes who had hypertensive right cerebellar hemorrhage onset 10/28/2017.  She was treated initially at [facility] and then transferred to [facility].  Neurosurgical evaluation per Dr. Ronnald Ramp concluded no surgery was needed.  The patient completed inpatient rehabilitation at [facility] (including SLP) 11/04/2017-11/18/2107 and was discharged at a 24-hour supervision level.  The patient has hired  caregiver to provide 24-hour supervision. Patient has received home health rehab services, including SLP.    Limitations  Standing;Walking    How long can you walk comfortably?  a few minutes    Patient Stated Goals  Patient wants to walk better and not need the RW.     Currently in Pain?  No/denies    Multiple Pain Sites  No            Patient instructed in advanced balance exercise  Standing in parallel bars:  Standing on airex foam: -alternate toe taps to 4 inch step with 1-0 rail assist x15 reps bilaterally; -Standing one foot on airex, one foot on 4 inch step, BUE ball pass side/side x6 reps each  foot on step -Heel/toe raises x15 reps with rail assist for balance -modified tandem stance: head turns side/side x5 reps each foot in front; -Feet together, BUE wand flexion 2# x10 reps with min A for safety and cues to improve erect posture.  Patient required min VCs for balance stability, including to increase trunk control for less loss of balance with smaller base of support  Stepping over 1/2 bolser: Forward/backward with 0 rail assist x5 reps  Side step with 0 rail assist x5 reps each direction Required mod VCs to increase hip flexion and increase step length for better foot clearance   Instructed patient in dynamic balance exercise: Resisted walking, 7.5# forward/backward, side/side x2 way, x2 laps each direction; required min A for safety and cues to improve weight shift especially with eccentric control for better balance control.  Weaving around cones #5 x3 laps with min A for safety without AD; required min VCs to increase step length and increase turn for better cone negotiation Side stepping forward/backward negotiating cones in zig/zag pattern #5 x1 lap each direction with min A for safety and mod VCs to increase step length and increase hip flexion for better foot clearance  Exercise:  Leg press: BLE 60# 15 reps x1 sets with rest break between sets; VCs for slowing eccentric return and performing movements slowly; Leg press, RLE only plate 30# I78 with cues for positioning; patient instructed in single leg, leg press to improve RLE control;  Leg press; heel raises 60# x20 reps with BLE; VCs for proper foot positioning and technique for exercise                   PT Education - 05/05/18 1043    Education Details  exercise technique/form, balance    Person(s) Educated  Patient    Methods  Explanation;Demonstration;Verbal cues    Comprehension  Verbalized understanding;Returned demonstration;Verbal cues required;Need further instruction       PT Short Term  Goals - 04/25/18 1158      PT SHORT TERM GOAL #1   Title  Patient will reduce timed up and go to <11 seconds to reduce fall risk and demonstrate improved transfer/gait ability.    Baseline  22.07 sec; 03/24/18: 14.2sec; 04/25/18: 14.0 sec with RW    Time  2    Period  Weeks    Status  On-going    Target Date  06/06/18      PT SHORT TERM GOAL #2   Title  Patient will be independent with ascend/descend 2 steps using single UE in step over step pattern without LOB.    Baseline  03/24/18: CGA, single UE support, step-to pattern without LOB; 04/25/18: CGA, single UE support, step-over-step pattern without LOB    Time  2    Period  Weeks    Status  On-going    Target Date  06/06/18        PT Long Term Goals - 04/25/18 1204      PT LONG TERM GOAL #1   Title  Patient will be independent in home exercise program to improve strength/mobility for better functional independence with ADLs.    Baseline  04/25/18: unsure but does not remember doing HEP lately    Time  12    Period  Weeks    Status  On-going    Target Date  06/06/18      PT LONG TERM GOAL #2   Title  Patient (> 65 years old) will complete five times sit to stand test in < 15 seconds indicating an increased LE strength and improved balance    Baseline  34.21 sec; 03/24/2018: 22.9 sec; 04/25/18: 18.5 sec with BUE support    Time  12    Period  Weeks    Status  On-going    Target Date  06/06/18      PT LONG TERM GOAL #3   Title  Patient will increase six minute walk test distance to >1000 for progression to community ambulator and improve gait ability    Baseline  550 feet; 03/24/18: 795 ft.; 04/25/18: 975 ft with RW    Time  12    Period  Weeks    Status  On-going    Target Date  06/06/18      PT LONG TERM GOAL #4   Title  Patient will increase 10 meter walk test to >1.24m/s as to improve gait speed for better community ambulation and to reduce fall risk    Baseline  .49 m/sec; 03/24/18: 1.01 m/sec;    Time  12    Period  Weeks     Status  Achieved            Plan - 05/05/18 1129    Clinical Impression Statement  Patient instructed in advanced balance activities. She was instructed in balance tasks without rail assist to challenge stance control. Patient does require min VCs for  correct exercise activity. She does require min A for safety especially with less rail assist. Patient exhibits impaired stance when on right leg due to weakness. Instructed patient in single leg, leg press to challenge strength control in RLE. She would benefit from additional skilled PT intervention to improve strength, balance and gait safety;     Rehab Potential  Fair    PT Frequency  2x / week    PT Duration  6 weeks    PT Treatment/Interventions  Patient/family education;Neuromuscular re-education;Balance training;Stair training;Therapeutic activities;Therapeutic exercise;Gait training;Aquatic Therapy    PT Next Visit Plan  balance and strengthening    Consulted and Agree with Plan of Care  Patient;Family member/caregiver       Patient will benefit from skilled therapeutic intervention in order to improve the following deficits and impairments:  Abnormal gait, Decreased balance, Decreased endurance, Decreased mobility, Impaired flexibility, Decreased strength, Decreased knowledge of use of DME, Decreased activity tolerance, Difficulty walking  Visit Diagnosis: Muscle weakness (generalized)  Difficulty in walking, not elsewhere classified     Problem List Patient Active Problem List   Diagnosis Date Noted  . Liver lesion 04/22/2018  . Goals of care, counseling/discussion 04/22/2018  . Cognitive deficit, post-stroke 12/31/2017  . Ataxia, post-stroke 12/31/2017  . Dysarthria, post-stroke   . Gait disturbance, post-stroke   . H/O  cerebral parenchymal hemorrhage 11/04/2017  . Essential hypertension 11/04/2017  . Hyperlipidemia 11/04/2017  . Diabetes ([place]) 11/04/2017  . CAD (coronary artery disease) 11/04/2017  . Aortic  arch aneurysm ([place]) 11/04/2017  . Hypokalemia 11/04/2017  . Right-sided nontraumatic intracerebral hemorrhage of cerebellum ([place])   . History of cervical cancer   . History of TIA (transient ischemic attack)   . Acute systolic congestive heart failure ([place])   . Reactive hypertension   . Hypernatremia   . Leukocytosis   . Acute blood loss anemia   . Elevated serum creatinine   . Acute respiratory failure with hypoxia ([place])   . IVH (intraventricular hemorrhage) ([place]) 10/29/2017  . Hypoxia 10/28/2017  . Pancreatic lesion 05/24/2017  . 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 Nodule of upper lobe of left lung 06/04/2015  . 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 Cerebral thrombosis with cerebral infarction 04/03/2015  . Cancer of right colon ([place]) 03/28/2015  . CVA (cerebral infarction) 12/21/2014  . Polycythemia vera ([place]) 06/22/2006    Kaiyana Bedore PT, DPT 05/05/2018, 12:14 PM  [place] MAIN [facility] SERVICES [address] [place], [place], [address] Phone: [phone]   Fax:  [phone]  Name: [name] MRN: [id number] Date of Birth: Apr 21, 1932

## 2018-05-05 NOTE — Therapy (Addendum)
[place] MAIN [facility] SERVICES [address] [place], [place], [address] Phone: [phone]   Fax:  [phone]  Occupational Therapy Treatment  Patient Details  Name: [name] MRN: [id number] Date of Birth: [birth year] No data recorded  Encounter Date: 05/05/2018  OT End of Session - 05/05/18 1024    Visit Number  17    Number of Visits  24    Date for OT Re-Evaluation  06/20/18    Authorization Type  Medicare    Authorization Time Period  visit 7/10 for reporting period starting 04/07/2018    OT Start Time  1015    OT Stop Time  1100    OT Time Calculation (min)  45 min    Activity Tolerance  Patient tolerated treatment well    Behavior During Therapy  [facility] for tasks assessed/performed       Past Medical History:  Diagnosis Date  . Adenocarcinoma in situ of cervix   . Arthritis    hands  . Breast cancer ([place])   . Colon cancer ([place])   . Diabetes mellitus without complication ([place])   . Endometrial carcinoma (HCC)    s/p total abdominal hysterectomy  . H/O compression fracture of spine 2014   thoracic spine  . H/O polycythemia vera   . H/O TIA (transient ischemic attack) and stroke 09/2014, 03/2015   No deficits  . Hypertension   . Hyperuricemia   . Microalbuminuria   . Polycythemia vera ([place])   . Recurrent falls   . Skin cancer    face, legs  . Stroke [facility]) 2008   no deficits  . Trochanteric bursitis   . Varicose veins    treated    Past Surgical History:  Procedure Laterality Date  . ABDOMINAL HYSTERECTOMY    . BOWEL RESECTION N/A 03/28/2015   Procedure: SMALL BOWEL RESECTION;  Surgeon: Leonie Green, MD;  Location: [facility] ORS;  Service: General;  Laterality: N/A;  . CATARACT EXTRACTION W/ INTRAOCULAR LENS IMPLANT Right   . CATARACT EXTRACTION W/PHACO Left 10/07/2015   Procedure: CATARACT EXTRACTION PHACO AND INTRAOCULAR LENS PLACEMENT (IOC);  Surgeon: Ronnell Freshwater, MD;  Location: [place];  Service: Ophthalmology;  Laterality: Left;  DIABETIC - oral meds VISION BLUE  . CORONARY ANGIOGRAPHY N/A 10/29/2017   Procedure: CORONARY ANGIOGRAPHY;  Surgeon: Dionisio David, MD;  Location: [place] CV LAB;  Service: Cardiovascular;  Laterality: N/A;  . EXPLORATORY LAPAROTOMY     for fibroids  . LEFT HEART CATH Right 10/29/2017   Procedure: Left Heart Cath;  Surgeon: Dionisio David, MD;  Location: [place] CV LAB;  Service: Cardiovascular;  Laterality: Right;  . TONSILLECTOMY      There were no vitals filed for this visit.  Subjective Assessment - 05/05/18 1023    Subjective   Pt reports having a good day and feeling better since going to the beauty parlor yesterday.    Pertinent History  84 year old female with adenocarcinoma of the cervix as well as endometrial carcinoma and polycythemia vera as well as diabetes who had hypertensive right cerebellar hemorrhage onset 10/28/2017.  She was treated initially at [facility] and then transferred to [facility].  Neurosurgical evaluation per Dr. Ronnald Ramp concluded no surgery was needed.  The patient completed inpatient rehabilitation at [facility] (including SLP) 11/04/2017-11/18/2107 and was discharged at a 24-hour supervision level.  The patient has hired caregiver to provide 24-hour supervision. Patient has received home health  rehab services, including SLP.     Patient Stated Goals  Patient would like to be independent in all tasks and be able to live alone again.    Currently in Pain?  No/denies    Pain Score  0-No pain       OT TREATMENT  Neuromuscular re-education:   Pt. worked on grasping 1" resistive cubes alternating thumb opposition to the tip of the 2nd and 3rd digits while the board is placed at a vertical angle. Pt. worked on pressing the cubes back into place while alternating isolated 2nd and 3rd digit extension. Pt required increased time to complete the task and noted hand fatigue throughout. Pt  required verbal cuing to use thumb, 2nd, and 3rd digits to prepare hand for functional tasks including handwriting.  Self-care:   Pt. worked on Media planner and speed. Pt. was able to write name x5 in print form in 3 mins. and 14 secs. and sign name x5 in cursive form in 2 mins. and 47 secs. Pt. used a mature hand grasp on the pen and had to readjust pen using her left hand x1 during task. Pt used unlined paper for the task and did not significantly deviate from the line nor the margin. Pt maintained appropriate letter and line spacing. Pt's demonstrates decreased legibility when increasing writing speed.                   OT Education - 05/05/18 1024    Education Details  [facility] skills, pinch strengthening, hand writing    Person(s) Educated  Patient    Methods  Explanation;Demonstration;Verbal cues    Comprehension  Verbalized understanding;Returned demonstration;Verbal cues required          OT Long Term Goals - 03/28/18 1412      OT LONG TERM GOAL #1   Title  Patient will complete bathing with modified independence    Baseline  Pt. continues to require ModA    Time  12    Period  Weeks    Status  On-going    Target Date  06/20/18      OT LONG TERM GOAL #2   Title  Patient will complete dressing skills with modified independence    Baseline  moderate assist    Time  12    Period  Weeks    Status  On-going    Target Date  06/20/18      OT LONG TERM GOAL #3   Title  Patient will complete light meal prep with min assist    Baseline  max assist    Time  12    Period  Weeks    Status  On-going    Target Date  06/20/18      OT LONG TERM GOAL #4   Title  Patient will increase R grip strength by 5# to open jars and containers with modified independence    Baseline  Pt. conitnues to have difficulaty opening jars, and containers.    Time  12    Period  Weeks    Status  On-going    Target Date  06/20/18      OT LONG TERM GOAL #5   Title  Patient will  improve strength by 1 mm grade RUE to assist with obtaining items from the closet.     Baseline  4/5 overall RUE. Pt. conitnues to have difficulty obtaining items from the closet.    Time  8    Period  Weeks    Target Date  06/20/18      OT LONG TERM GOAL #6   Title  Pt will increase handwriting legibility and speed to 50% while writing a 3 sentence paragraph in order to write thank you notes and birthday cards for friends and family    Baseline  03/14/2018:  25% legibility to writie a sentence. 50% legibility for her name    Time  4    Period  Weeks    Status  On-going    Target Date  06/20/18            Plan - 05/05/18 1025    Clinical Impression Statement  Pt continues to work on right hand strengthening, [facility] skills, and handwriting legibility. Pt is making progress and increasing her hand writing legibility. Pt to continue to work to increase writing speed with increased legibility. Pt is working to make improvements to increase indepenedence during ADLs and IADLs and decrease caregiver burden.    Occupational Profile and client history currently impacting functional performance  family more than 30 mins away, has 24 hour caregiver assist, decreased awareness, memory and safety    Occupational performance deficits (Please refer to evaluation for details):  ADL's;IADL's;Social Participation    Rehab Potential  Good    Current Impairments/barriers affecting progress:  memory, requires 24 hour caregivers,     OT Frequency  2x / week    OT Duration  12 weeks    OT Treatment/Interventions  Self-care/ADL training;Cryotherapy;Therapeutic exercise;DME and/or AE instruction;Functional Mobility Training;Cognitive remediation/compensation;Balance training;Neuromuscular education;Manual Therapy;Moist Heat;Therapeutic activities;Patient/family education    Clinical Decision Making  Several treatment options, min-mod task modification necessary    Consulted and Agree with Plan of Care  Patient     Family Member Consulted  caregiver       Patient will benefit from skilled therapeutic intervention in order to improve the following deficits and impairments:  Decreased balance, Decreased mobility, Difficulty walking, Decreased cognition, Decreased activity tolerance, Decreased coordination, Decreased safety awareness, Decreased strength, Impaired UE functional use  Visit Diagnosis: Muscle weakness (generalized)    Problem List Patient Active Problem List   Diagnosis Date Noted  . Liver lesion 04/22/2018  . Goals of care, counseling/discussion 04/22/2018  . Cognitive deficit, post-stroke 12/31/2017  . Ataxia, post-stroke 12/31/2017  . Dysarthria, post-stroke   . Gait disturbance, post-stroke   . H/O cerebral parenchymal hemorrhage 11/04/2017  . Essential hypertension 11/04/2017  . Hyperlipidemia 11/04/2017  . Diabetes ([place]) 11/04/2017  . CAD (coronary artery disease) 11/04/2017  . 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 Hypokalemia 11/04/2017  . Right-sided nontraumatic intracerebral hemorrhage of cerebellum ([place])   . History of cervical cancer   . History of TIA (transient ischemic attack)   . Acute systolic congestive heart failure ([place])   . Reactive hypertension   . Hypernatremia   . Leukocytosis   . Acute blood loss anemia   . Elevated serum creatinine   . Acute respiratory failure with hypoxia ([place])   . IVH (intraventricular hemorrhage) ([place]) 10/29/2017  . Hypoxia 10/28/2017  . Pancreatic lesion 05/24/2017  . 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 Nodule of upper lobe of left lung 06/04/2015  . 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 Cerebral thrombosis with cerebral infarction 04/03/2015  . Cancer of right colon ([place]) 03/28/2015  . CVA (cerebral infarction) 12/21/2014  . Polycythemia vera ([place]) 06/22/2006    Oliver Hum, OTS 05/05/2018, 10:34 AM   Harrel Carina, MS,  OTR/L   [place] MAIN [facility] SERVICES [address] [place], [place], [address] Phone: [phone]   Fax:  [phone]  Name: [name] MRN: [id number] Date of Birth: [birth year]

## 2018-05-06 ENCOUNTER — Inpatient Hospital Stay (HOSPITAL_BASED_OUTPATIENT_CLINIC_OR_DEPARTMENT_OTHER): Payer: Medicare Other | Admitting: Hematology and Oncology

## 2018-05-06 ENCOUNTER — Other Ambulatory Visit: Payer: Self-pay | Admitting: *Deleted

## 2018-05-06 ENCOUNTER — Other Ambulatory Visit: Payer: Self-pay

## 2018-05-06 VITALS — BP 112/67 | HR 63 | Temp 96.8°F | Resp 18 | Wt 103.8 lb

## 2018-05-06 DIAGNOSIS — R911 Solitary pulmonary nodule: Secondary | ICD-10-CM

## 2018-05-06 DIAGNOSIS — N289 Disorder of kidney and ureter, unspecified: Secondary | ICD-10-CM

## 2018-05-06 DIAGNOSIS — Z85038 Personal history of other malignant neoplasm of large intestine: Secondary | ICD-10-CM | POA: Diagnosis not present

## 2018-05-06 DIAGNOSIS — Z79899 Other long term (current) drug therapy: Secondary | ICD-10-CM | POA: Diagnosis not present

## 2018-05-06 DIAGNOSIS — Z87891 Personal history of nicotine dependence: Secondary | ICD-10-CM | POA: Diagnosis not present

## 2018-05-06 DIAGNOSIS — K769 Liver disease, unspecified: Secondary | ICD-10-CM

## 2018-05-06 DIAGNOSIS — Z8673 Personal history of transient ischemic attack (TIA), and cerebral infarction without residual deficits: Secondary | ICD-10-CM | POA: Diagnosis not present

## 2018-05-06 DIAGNOSIS — K869 Disease of pancreas, unspecified: Secondary | ICD-10-CM | POA: Diagnosis not present

## 2018-05-06 DIAGNOSIS — Z90710 Acquired absence of both cervix and uterus: Secondary | ICD-10-CM | POA: Diagnosis not present

## 2018-05-06 DIAGNOSIS — Z794 Long term (current) use of insulin: Secondary | ICD-10-CM | POA: Diagnosis not present

## 2018-05-06 DIAGNOSIS — Z7982 Long term (current) use of aspirin: Secondary | ICD-10-CM | POA: Diagnosis not present

## 2018-05-06 DIAGNOSIS — D45 Polycythemia vera: Secondary | ICD-10-CM

## 2018-05-06 DIAGNOSIS — Z8542 Personal history of malignant neoplasm of other parts of uterus: Secondary | ICD-10-CM | POA: Diagnosis not present

## 2018-05-06 DIAGNOSIS — Z7189 Other specified counseling: Secondary | ICD-10-CM

## 2018-05-06 DIAGNOSIS — D509 Iron deficiency anemia, unspecified: Secondary | ICD-10-CM | POA: Diagnosis not present

## 2018-05-06 DIAGNOSIS — D3A029 Benign carcinoid tumor of the large intestine, unspecified portion: Secondary | ICD-10-CM

## 2018-05-06 DIAGNOSIS — I10 Essential (primary) hypertension: Secondary | ICD-10-CM | POA: Diagnosis not present

## 2018-05-06 DIAGNOSIS — Z85828 Personal history of other malignant neoplasm of skin: Secondary | ICD-10-CM | POA: Diagnosis not present

## 2018-05-06 DIAGNOSIS — E119 Type 2 diabetes mellitus without complications: Secondary | ICD-10-CM | POA: Diagnosis not present

## 2018-05-06 DIAGNOSIS — K862 Cyst of pancreas: Secondary | ICD-10-CM | POA: Diagnosis not present

## 2018-05-06 DIAGNOSIS — R590 Localized enlarged lymph nodes: Secondary | ICD-10-CM | POA: Diagnosis not present

## 2018-05-06 NOTE — Progress Notes (Signed)
[place] [facility] day:  05/06/2018   Chief Complaint: [name] is a 84 y.o. 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  HPI:  The patient was last seen in the medical oncology clinic on 04/22/2018.  At that time, she was doing well following her recent NSTEMI and cerebellar ICH. She had assistance with her ADLs. She denied any B symptoms or recent infections. Exam was stable.  Abdomen MRI revealed multiple indeterminate lesions within the liver. We discussed PET scan.  PET scan on 04/27/2018 revealed no focal hypermetabolic uptake in the region of the hepatic lesions seen on recent abdominal MRI. While lack of uptake was somewhat reassuring, continued close attention recommended as well differentiated or low-grade neoplasm can be poorly FDG avid.  There was focal hypermetabolic uptake in a nondilated small bowel loop of the right lower quadrant without underlying nodule or mass evident by CT. Finding were indeterminate, but patient has a reported history of carcinoid.  There was a 9 mm left upper lobe pulmonary nodule stable since at least 05/30/2015 and shows no hypermetabolism.  During the interim, patient has been doing well overall. She denies any acute complaints.    Past Medical History:  Diagnosis Date  . Adenocarcinoma in situ of cervix   . Arthritis    hands  . Breast cancer ([place])   . Colon cancer ([place])   . Diabetes mellitus without complication ([place])   . Endometrial carcinoma (HCC)    s/p total abdominal hysterectomy  . H/O compression fracture of spine 2014   thoracic spine  . H/O polycythemia vera   . H/O TIA (transient ischemic attack) and stroke 09/2014, 03/2015   No deficits  . Hypertension   . Hyperuricemia   . Microalbuminuria   . Polycythemia vera ([place])   . Recurrent  falls   . Skin cancer    face, legs  . Stroke [facility]) 2008   no deficits  . Trochanteric bursitis   . Varicose veins    treated    Past Surgical History:  Procedure Laterality Date  . ABDOMINAL HYSTERECTOMY    . BOWEL RESECTION N/A 03/28/2015   Procedure: SMALL BOWEL RESECTION;  Surgeon: Leonie Green, MD;  Location: [facility] ORS;  Service: General;  Laterality: N/A;  . CATARACT EXTRACTION W/ INTRAOCULAR LENS IMPLANT Right   . CATARACT EXTRACTION W/PHACO Left 10/07/2015   Procedure: CATARACT EXTRACTION PHACO AND INTRAOCULAR LENS PLACEMENT (IOC);  Surgeon: Ronnell Freshwater, MD;  Location: [place];  Service: Ophthalmology;  Laterality: Left;  DIABETIC - oral meds VISION BLUE  . CORONARY ANGIOGRAPHY N/A 10/29/2017   Procedure: CORONARY ANGIOGRAPHY;  Surgeon: Dionisio David, MD;  Location: [place] CV LAB;  Service: Cardiovascular;  Laterality: N/A;  . EXPLORATORY LAPAROTOMY     for fibroids  . LEFT HEART CATH Right 10/29/2017   Procedure: Left Heart Cath;  Surgeon: Dionisio David, MD;  Location: [place] CV LAB;  Service: Cardiovascular;  Laterality: Right;  . TONSILLECTOMY      Family History  Problem Relation Age of Onset  . Cancer Brother        AML  . Heart attack Mother   . Breast cancer Mother   . Heart attack Father   . Heart attack Sister   . Diabetes Sister   Her  brother died of AML.  Social History:  reports that she has quit smoking. She has never used smokeless tobacco. She reports that she does not drink alcohol or use drugs.  The patient's husband died of AML.  She has no family in [place]. 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 The patient is accompanied by her niece, [name], who has medical power of attorney, today.  Allergies:  Allergies  Allergen Reactions  . Simvastatin     Other reaction(s): Muscle Pain    Current Medications: Current Outpatient Medications   Medication Sig Dispense Refill  . amLODipine (NORVASC) 2.5 MG tablet Take 1 tablet (2.5 mg total) by mouth daily. 30 tablet 0  . blood glucose meter kit and supplies Dispense based on patient and insurance preference. Use up to four times daily as directed. (FOR ICD-10 E10.9, E11.9). 1 each 0  . calcium-vitamin D (OSCAL WITH D) 500-200 MG-UNIT per tablet Take 1 tablet by mouth daily.     . carvedilol (COREG) 25 MG tablet Take 1 tablet (25 mg total) by mouth 2 (two) times daily with a meal. 60 tablet 0  . clopidogrel (PLAVIX) 75 MG tablet Take 1 tablet (75 mg total) by mouth daily. 90 tablet 4  . docusate sodium (COLACE) 250 MG capsule Take 250 mg by mouth daily as needed for constipation.    . fexofenadine (ALLEGRA ALLERGY) 180 MG tablet Take 1 tablet (180 mg total) by mouth daily.    . hydroxyurea (HYDREA) 500 MG capsule Take 1 tablet daily except on Saturdays 30 capsule 0  . insulin detemir (LEVEMIR) 100 unit/ml SOLN Inject 0.08 mLs (8 Units total) into the skin at bedtime. (Patient taking differently: Inject 10 Units into the skin at bedtime. ) 8 mL 1  . nitroGLYCERIN (NITRODUR - DOSED IN MG/24 HR) 0.2 mg/hr patch Place 1 patch (0.2 mg total) onto the skin daily. (Patient taking differently: Place 0.2 mg onto the skin daily. ) 30 patch 12  . pantoprazole (PROTONIX) 40 MG tablet Take 1 tablet (40 mg total) by mouth daily. 30 tablet 0  . pravastatin (PRAVACHOL) 20 MG tablet Take 1 tablet (20 mg total) by mouth at bedtime. 30 tablet 0  . ramipril (ALTACE) 5 MG capsule Take 5 mg by mouth 2 (two) times daily.    . sertraline (ZOLOFT) 25 MG tablet Take 25 mg by mouth daily.    Marland Kitchen spironolactone (ALDACTONE) 25 MG tablet Take 1 tablet (25 mg total) by mouth daily. (Patient taking differently: Take 12.5 mg by mouth daily. ) 30 tablet 0  . Probiotic Product (PROBIOTIC DAILY PO) Take 1 tablet by mouth daily.     No current facility-administered medications for this visit.     Review of Systems   Constitutional: Positive for weight loss (4 pounds). Negative for chills, diaphoresis, fever and malaise/fatigue.       Doing "alright".  HENT: Negative.   Eyes: Negative.  Negative for blurred vision, double vision and photophobia.  Respiratory: Negative.  Negative for cough, hemoptysis, sputum production and shortness of breath.   Cardiovascular: Positive for leg swelling. Negative for chest pain, palpitations, orthopnea and PND.       Recent NSTEMI. Ischemic CHF. LVEF 25%.  Gastrointestinal: Positive for constipation. Negative for blood in stool, diarrhea and melena.  Genitourinary: Negative.  Negative for dysuria, frequency, hematuria and urgency.  Musculoskeletal: Negative.  Negative for back pain, falls, joint pain and myalgias.  Baker's cyst.  Skin: Negative for itching and rash.  Neurological: Positive for speech change (dysarthria). Negative for dizziness, tremors, weakness (generalized) and headaches.       Recent cerebellar ICH. (+) dysmetria. (+) coordination and gait abnormalities. Requires assistance with ADLs.   Endo/Heme/Allergies: Negative.  Does not bruise/bleed easily.  Psychiatric/Behavioral: Positive for memory loss (poor awareness of physical limitations). Negative for depression and suicidal ideas. The patient is nervous/anxious. The patient does not have insomnia.   All other systems reviewed and are negative.  Performance status (ECOG): 2-3  Vital Signs BP 112/67 (BP Location: Left Arm, Patient Position: Sitting)   Pulse 63   Temp (!) 96.8 F (36 C) (Tympanic)   Resp 18   Wt 103 lb 12.8 oz (47.1 kg)   BMI 18.39 kg/m   Physical Exam  Constitutional: She is oriented to person, place, and time and well-developed, well-nourished, and in no distress. No distress.  HENT:  Head: Normocephalic and atraumatic.  Eyes: Conjunctivae and EOM are normal. No scleral icterus.  Neurological: She is alert and oriented to person, place, and time. She displays weakness  and abnormal speech (dysarthria). Coordination abnormal.  Word finding difficulties.  Psychiatric: Mood and affect normal. She expresses impulsivity (poor awareness of physical limitations).  Nursing note and vitals reviewed.    No visits with results within 3 Day(s) from this visit.  Latest known visit with results is:  Hospital Outpatient Visit on 04/27/2018  Component Date Value Ref Range Status  . Glucose-Capillary 04/27/2018 105* 70 - 99 mg/dL Final     Assessment:  [name] is a 84 y.o. female with JAK2+ polycythemia rubra vera (PV) diagnosed in 2008. She has undergone low phlebotomies x 2-3 to maintain a target hematocrit is 40-42.5.   She is on hydroxyurea 500 mg (1 pill) on Mondays, Wednesdays, Fridays and Saturdays (5 pills/week). Screening for von Willebrand and a platelet function was normal. She is on a baby aspirin. She has been on Plavix since her TIA/CVA.   She has a history of iron deficiency anemia status post GI evaluation. Colonoscopy in 07/2007 was negative. For persistent recurrent iron deficiency anemia, she underwent EGD and colonoscopy in 12/2011 and 09/11/2013.  She has a history of carcinoid tumor after presenting with a partial small bowel obstruction.  Abdominal and pelvic CT scan on 03/22/2015 revealed a 3 x 4.9 cm apple core lesion involving a loop of small bowel in the right mid abdomen. There was associated 4 x 1.8 cm adjacent mesenteric implant. Ileocolectomy on 03/28/2015 revealed a grade I neuroendocrine tumor (carcinoid) involving the terminal ileum and right colon.  Largest tumor was 2.5 cm. There were multiple adjacent nodules of a neuroendocrine tumor. Metastasis was in 9 of 12 lymph nodes.  Pathologic stage was T3N1.  Chromogranin was 4 on 05/07/2015, 3 on 04/13/2016, 4 on 11/09/2016, and 10 on 01/24/2018.   24 hour urine for 5HIAA was 5.2 mg/24 hours (0-14.9).  Octreotide scan on 05/31/2015 revealed no evidence of metastatic disease.  There was  an indeterminate 9 mm nodule in the left apex.    Chest CT on 06/11/2015 revealed an 8 mm nodular density (spiculated on axial images) in the left apex. Chest CT on 10/10/2015 revealed a stable 8 mm pulmonary nodule in the anterior left lung apex.  Chest, abdomen and pelvic CT scan on 04/20/2016 revealed no definite evidence of recurrent or metastatic carcinoid tumor. There are no acute findings. There was a stable 8 mm pulmonary nodule  in the left apex recommendations. 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 Recommendation was continued attention on follow-up or follow-up with abdominal MRI with and without contrast in 2 years.  Chest CT angiogram on 10/28/2017 revealed a stable 8 mm irregular density at the LEFT lung apex, probable scar.  Abdomen MRI on 04/20/2018 revealed stable cystic lesion in the tail of the pancreas compared to CT 03/22/2015.  There were multiple indeterminate lesions within the liver which were not identified on comparison CT from 04/20/2016. Differential would include focal nodular hyperplasia versus metastatic disease.  PET scan on 04/27/2018 revealed no focal hypermetabolic uptake in the region of the hepatic lesions seen on recent abdominal MRI. While lack of uptake was somewhat reassuring, continued close attention recommended as well differentiated or low-grade neoplasm can be poorly FDG avid.  There was focal hypermetabolic uptake in a nondilated small bowel loop of the right lower quadrant without underlying nodule or mass evident by CT. Finding were indeterminate, but patient has a reported history of carcinoid.  There was a 9 mm left upper lobe pulmonary nodule stable since at least 05/30/2015 and shows no hypermetabolism.  She had shingles involving the right S1/S2 dermatome in 05/2017.    She was [facility] to [facility] from 10/29/2017 - 11/16/2017.  She had a NSTEMI and  underwent cardiac cath which showed three-vessel disease and severe LV dysfunction.  EF was 25%.  CABG was felt high risk.  Post cardiac cath, head CT showed a RIGHT cerebellar parenchymal hemorrhage felt to be secondary to hypertensive etiology on dual antiplatelet therapy. She was scheduled for PCI with stent placement on 01/13/2018, however after second option with Dr. Clayborn Bigness, PCI was deferred.  She is on aspirin and Plavix.  Symptomatically, she continues to do well s/p recent NSTEMI and cerebellar ICH. She requires assistance with her ADLs. Exam is stable.   Plan: 1. Labs today:  CBC with diff, CMP. 2. Acute renal insufficiency Labs on 04/21/2018 BUN and creatinine 1.11 mg/dL (CrCl 28.9 mL/min). CBC and urinalysis unremarkable. Renal ultrasound on 04/21/2018 No evidence of hydronephrosis or specific renal sonographic abnormality. Spleen mildly enlarged (646 cm). 3. Pancreatic cystic lesion   Abominal MRI on 04/20/2018 revealed a stable 16 x 11 mm cystic lesion noted in the tail of the pancreas. 4. Hepatic lesions on MRI Multiple indeterminate lesions within the liver on abdomen MRI.  Further evaluation by PET scan reviewed.   Images personally reviewed.  Agree with radiology interpretation.  PET scan on 04/27/2018 revealed no focal hypermetabolic uptake in the region of the hepatic lesions. Discuss ongoing surveillance with abdominal MRI. 5. JAK2+ MPN (PV) Labs from 04/21/2018 revealed a platelet count of 358,000. Goal is </= 400,000. Hematocrit was 42.2. Goal is </= 42. Continue on hydroxyurea 500 mg x 5 pills/week.  6. Carcinoid tumor of the terminal ileum Patient denies any diarrhea or flushing. Last chromogranin A level on 01/24/2018 was slightly elevated at 10. PET scan revealed a a lesion of unclear significance with a focal hypermetabolic uptake in a nondilated small bowel loop of the right lower quadrant  Consider 24-hour urine for 5-HIAA. Consider future octreotide scan  or 68-Ga DOTATATE PET scan. 7. Abdomen MRI on 07/21/2017. 8. RTC after abdomen MRI for MD assessment, labs (CBC with diff, CMP, chromogranin A, AFP), and review MRI results.    Lequita Asal, MD 05/06/2018, 4:35 PM

## 2018-05-06 NOTE — Progress Notes (Signed)
Here for follow up.  Here for results of PET scan.

## 2018-05-09 ENCOUNTER — Ambulatory Visit: Payer: Medicare Other | Admitting: Physical Therapy

## 2018-05-09 ENCOUNTER — Encounter: Payer: Self-pay | Admitting: Physical Therapy

## 2018-05-09 DIAGNOSIS — R262 Difficulty in walking, not elsewhere classified: Secondary | ICD-10-CM

## 2018-05-09 DIAGNOSIS — R278 Other lack of coordination: Secondary | ICD-10-CM

## 2018-05-09 DIAGNOSIS — M6281 Muscle weakness (generalized): Secondary | ICD-10-CM

## 2018-05-09 NOTE — Therapy (Signed)
[place] MAIN [facility] SERVICES [address] [place], [place], [address] Phone: [phone]   Fax:  [phone]  Physical Therapy Treatment  Patient Details  Name: [name] MRN: [id number] Date of Birth: 05-28-1932 Referring Provider (PT): Charlett Blake   Encounter Date: 05/09/2018  PT End of Session - 05/09/18 1108    Visit Number  16    Number of Visits  25    Date for PT Re-Evaluation  06/06/18    Authorization Type  3/10 last goals 04/25/18, start of care 01/31/18    PT Start Time  1058    PT Stop Time  1145    PT Time Calculation (min)  47 min    Equipment Utilized During Treatment  Gait belt    Activity Tolerance  Patient tolerated treatment well    Behavior During Therapy  WFL for tasks assessed/performed       Past Medical History:  Diagnosis Date  . Adenocarcinoma in situ of cervix   . Arthritis    hands  . Breast cancer ([place])   . Colon cancer ([place])   . Diabetes mellitus without complication ([place])   . Endometrial carcinoma (HCC)    s/p total abdominal hysterectomy  . H/O compression fracture of spine 2014   thoracic spine  . H/O polycythemia vera   . H/O TIA (transient ischemic attack) and stroke 09/2014, 03/2015   No deficits  . Hypertension   . Hyperuricemia   . Microalbuminuria   . Polycythemia vera ([place])   . Recurrent falls   . Skin cancer    face, legs  . Stroke [facility]) 2008   no deficits  . Trochanteric bursitis   . Varicose veins    treated    Past Surgical History:  Procedure Laterality Date  . ABDOMINAL HYSTERECTOMY    . BOWEL RESECTION N/A 03/28/2015   Procedure: SMALL BOWEL RESECTION;  Surgeon: Leonie Green, MD;  Location: [facility] ORS;  Service: General;  Laterality: N/A;  . CATARACT EXTRACTION W/ INTRAOCULAR LENS IMPLANT Right   . CATARACT EXTRACTION W/PHACO Left 10/07/2015   Procedure: CATARACT EXTRACTION PHACO AND INTRAOCULAR LENS PLACEMENT (IOC);  Surgeon: Ronnell Freshwater,  MD;  Location: [place];  Service: Ophthalmology;  Laterality: Left;  DIABETIC - oral meds VISION BLUE  . CORONARY ANGIOGRAPHY N/A 10/29/2017   Procedure: CORONARY ANGIOGRAPHY;  Surgeon: Dionisio David, MD;  Location: [place] CV LAB;  Service: Cardiovascular;  Laterality: N/A;  . EXPLORATORY LAPAROTOMY     for fibroids  . LEFT HEART CATH Right 10/29/2017   Procedure: Left Heart Cath;  Surgeon: Dionisio David, MD;  Location: [place] CV LAB;  Service: Cardiovascular;  Laterality: Right;  . TONSILLECTOMY      There were no vitals filed for this visit.  Subjective Assessment - 05/09/18 1103    Subjective  Patient reports doing well; reports being sore over the weekend after last session but states, "That means that we did something that we needed to do." She denies any pain currently;     Pertinent History    84 year old female with adenocarcinoma of the cervix as well as endometrial carcinoma and polycythemia vera as well as diabetes who had hypertensive right cerebellar hemorrhage onset 10/28/2017.  She was treated initially at [facility] and then transferred to [facility].  Neurosurgical evaluation per Dr. Ronnald Ramp concluded no surgery was needed.  The patient completed inpatient rehabilitation at [facility] (including SLP)  11/04/2017-11/18/2107 and was discharged at a 24-hour supervision level.  The patient has hired caregiver to provide 24-hour supervision. Patient has received home health rehab services, including SLP.    Limitations  Standing;Walking    How long can you walk comfortably?  a few minutes    Patient Stated Goals  Patient wants to walk better and not need the RW.     Currently in Pain?  No/denies    Multiple Pain Sites  No          Warm up on Nustep, BUE/BLE level 2 x5 min (unbilled);   Standing in parallel bars:  Standing on airex foam: -alternate toe taps to 4 inch step with 0 rail assist x15 reps bilaterally; -Standing one  foot on airex, one foot on 4 inch step, BUE ball pass side/side x5 reps each foot on step -modified tandem stance: head turns side/side x5 reps each foot in front; -modified tandem stance, eyes closed 10 sec hold, unsupported, CGA for safety x1 rep each foot in front; patient had a harder time with left foot ahead of right foot. -Feet together,head turns up/down x10 reps unsupported, CGA for safety;  Patient required min VCs for balance stability, including to increase trunk control for less loss of balance with smaller base of support   Standing on 1/2 foam: (Flat side up) -heel/toe rocks with feet apart heel/toe rocks x15 with rail assist for safety -feet apart, BUE wand flexion x10 reps with CGA for safety and cues to improve ankle strategies for better stance control -tandem stance with 2-0 rail assist 10 sec hold x2 reps each foot in front with CGA to min A for safety and cues to improve erect posture and increase weight shift for better stance control   Instructed patient in dynamic balance exercise: Resisted walking, 7.5# forward/backward, side/side x2 way, x2 laps each direction; required min A for safety and cues to improve weight shift especially with eccentric control for better balance control.  Exercise:  Leg press: BLE 60# 15 reps x1 sets; VCs for slowing eccentric return and performing movements slowly; Leg press, RLE only plate 35# Q00 with cues for positioning; patient instructed in single leg, leg press to improve RLE control;  Leg press; heel raises 65# x20 reps with BLE; VCs for proper foot positioning and technique for exercise  Seated: rolling stick to bilateral thigh to reduce tightness and reduce delayed onset muscle soreness x3 min                      PT Education - 05/09/18 1107    Education Details  exercise technique/form/balance    Person(s) Educated  Patient    Methods  Explanation;Demonstration;Verbal cues    Comprehension   Verbalized understanding;Returned demonstration;Verbal cues required;Need further instruction       PT Short Term Goals - 04/25/18 1158      PT SHORT TERM GOAL #1   Title  Patient will reduce timed up and go to <11 seconds to reduce fall risk and demonstrate improved transfer/gait ability.    Baseline  22.07 sec; 03/24/18: 14.2sec; 04/25/18: 14.0 sec with RW    Time  2    Period  Weeks    Status  On-going    Target Date  06/06/18      PT SHORT TERM GOAL #2   Title  Patient will be independent with ascend/descend 2 steps using single UE in step over step pattern without LOB.    Baseline  03/24/18: CGA, single UE support, step-to pattern without LOB; 04/25/18: CGA, single UE support, step-over-step pattern without LOB    Time  2    Period  Weeks    Status  On-going    Target Date  06/06/18        PT Long Term Goals - 04/25/18 1204      PT LONG TERM GOAL #1   Title  Patient will be independent in home exercise program to improve strength/mobility for better functional independence with ADLs.    Baseline  04/25/18: unsure but does not remember doing HEP lately    Time  12    Period  Weeks    Status  On-going    Target Date  06/06/18      PT LONG TERM GOAL #2   Title  Patient (> 18 years old) will complete five times sit to stand test in < 15 seconds indicating an increased LE strength and improved balance    Baseline  34.21 sec; 03/24/2018: 22.9 sec; 04/25/18: 18.5 sec with BUE support    Time  12    Period  Weeks    Status  On-going    Target Date  06/06/18      PT LONG TERM GOAL #3   Title  Patient will increase six minute walk test distance to >1000 for progression to community ambulator and improve gait ability    Baseline  550 feet; 03/24/18: 795 ft.; 04/25/18: 975 ft with RW    Time  12    Period  Weeks    Status  On-going    Target Date  06/06/18      PT LONG TERM GOAL #4   Title  Patient will increase 10 meter walk test to >1.70m/s as to improve gait speed for better  community ambulation and to reduce fall risk    Baseline  .49 m/sec; 03/24/18: 1.01 m/sec;    Time  12    Period  Weeks    Status  Achieved            Plan - 05/09/18 1156    Clinical Impression Statement  Patient tolerated session well. She reported soreness after last session but states that its better today. instructed patient in advanced balance activities. Progressed balance challenge with increased uneven surfaces utilizing dyna disc, 1/2 bolster etc. Patient able to do exercise without rail assist, requiring CGA to min A for safety; Patient does exhibit impaired ankle control with poor ankle strategies. Patient instructed in advanced LE strengthening exercise for better control. She would benefit from additional skilled PT intervention to improve strength, balance and gait safety;      Rehab Potential  Fair    PT Frequency  2x / week    PT Duration  6 weeks    PT Treatment/Interventions  Patient/family education;Neuromuscular re-education;Balance training;Stair training;Therapeutic activities;Therapeutic exercise;Gait training;Aquatic Therapy    PT Next Visit Plan  balance and strengthening    Consulted and Agree with Plan of Care  Patient;Family member/caregiver       Patient will benefit from skilled therapeutic intervention in order to improve the following deficits and impairments:  Abnormal gait, Decreased balance, Decreased endurance, Decreased mobility, Impaired flexibility, Decreased strength, Decreased knowledge of use of DME, Decreased activity tolerance, Difficulty walking  Visit Diagnosis: Muscle weakness (generalized)  Difficulty in walking, not elsewhere classified  Other lack of coordination     Problem List Patient Active Problem List   Diagnosis Date Noted  . Liver  lesion 04/22/2018  . Goals of care, counseling/discussion 04/22/2018  . Cognitive deficit, post-stroke 12/31/2017  . Ataxia, post-stroke 12/31/2017  . Dysarthria, post-stroke   . Gait  disturbance, post-stroke   . H/O cerebral parenchymal hemorrhage 11/04/2017  . Essential hypertension 11/04/2017  . Hyperlipidemia 11/04/2017  . Diabetes ([place]) 11/04/2017  . CAD (coronary artery disease) 11/04/2017  . 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 Hypokalemia 11/04/2017  . Right-sided nontraumatic intracerebral hemorrhage of cerebellum ([place])   . History of cervical cancer   . History of TIA (transient ischemic attack)   . Acute systolic congestive heart failure ([place])   . Reactive hypertension   . Hypernatremia   . Leukocytosis   . Acute blood loss anemia   . Elevated serum creatinine   . Acute respiratory failure with hypoxia ([place])   . IVH (intraventricular hemorrhage) ([place]) 10/29/2017  . Hypoxia 10/28/2017  . Pancreatic lesion 05/24/2017  . 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 Nodule of upper lobe of left lung 06/04/2015  . 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 Cerebral thrombosis with cerebral infarction 04/03/2015  . Cancer of right colon ([place]) 03/28/2015  . CVA (cerebral infarction) 12/21/2014  . Polycythemia vera ([place]) 06/22/2006    Trotter,Margaret PT, DPT 05/09/2018, 12:03 PM  [place] MAIN [facility] SERVICES [address] [place], [place], [address] Phone: [phone]   Fax:  [phone]  Name: [name] MRN: [id number] Date of Birth: [birth year]

## 2018-05-12 ENCOUNTER — Ambulatory Visit: Payer: Medicare Other | Admitting: Occupational Therapy

## 2018-05-12 ENCOUNTER — Ambulatory Visit: Payer: Medicare Other

## 2018-05-12 ENCOUNTER — Encounter: Payer: Self-pay | Admitting: Occupational Therapy

## 2018-05-12 DIAGNOSIS — R278 Other lack of coordination: Secondary | ICD-10-CM | POA: Diagnosis not present

## 2018-05-12 DIAGNOSIS — M6281 Muscle weakness (generalized): Secondary | ICD-10-CM

## 2018-05-12 DIAGNOSIS — R262 Difficulty in walking, not elsewhere classified: Secondary | ICD-10-CM

## 2018-05-12 NOTE — Therapy (Signed)
[place] MAIN [facility] SERVICES [address] [place], [place], [address] Phone: [phone]   Fax:  [phone]  Physical Therapy Treatment  Patient Details  Name: [name] MRN: [id number] Date of Birth: Jan 10, 1932 Referring Provider (PT): Charlett Blake   Encounter Date: 05/12/2018  PT End of Session - 05/12/18 1107    Visit Number  17    Number of Visits  25    Date for PT Re-Evaluation  06/06/18    Authorization Type  4/10 last goals 04/25/18, start of care 01/31/18    PT Start Time  1102    PT Stop Time  1147    PT Time Calculation (min)  45 min    Equipment Utilized During Treatment  Gait belt    Activity Tolerance  Patient tolerated treatment well    Behavior During Therapy  WFL for tasks assessed/performed       Past Medical History:  Diagnosis Date  . Adenocarcinoma in situ of cervix   . Arthritis    hands  . Breast cancer ([place])   . Colon cancer ([place])   . Diabetes mellitus without complication ([place])   . Endometrial carcinoma (HCC)    s/p total abdominal hysterectomy  . H/O compression fracture of spine 2014   thoracic spine  . H/O polycythemia vera   . H/O TIA (transient ischemic attack) and stroke 09/2014, 03/2015   No deficits  . Hypertension   . Hyperuricemia   . Microalbuminuria   . Polycythemia vera ([place])   . Recurrent falls   . Skin cancer    face, legs  . Stroke [facility]) 2008   no deficits  . Trochanteric bursitis   . Varicose veins    treated    Past Surgical History:  Procedure Laterality Date  . ABDOMINAL HYSTERECTOMY    . BOWEL RESECTION N/A 03/28/2015   Procedure: SMALL BOWEL RESECTION;  Surgeon: Leonie Green, MD;  Location: [facility] ORS;  Service: General;  Laterality: N/A;  . CATARACT EXTRACTION W/ INTRAOCULAR LENS IMPLANT Right   . CATARACT EXTRACTION W/PHACO Left 10/07/2015   Procedure: CATARACT EXTRACTION PHACO AND INTRAOCULAR LENS PLACEMENT (IOC);  Surgeon: Ronnell Freshwater,  MD;  Location: [place];  Service: Ophthalmology;  Laterality: Left;  DIABETIC - oral meds VISION BLUE  . CORONARY ANGIOGRAPHY N/A 10/29/2017   Procedure: CORONARY ANGIOGRAPHY;  Surgeon: Dionisio David, MD;  Location: [place] CV LAB;  Service: Cardiovascular;  Laterality: N/A;  . EXPLORATORY LAPAROTOMY     for fibroids  . LEFT HEART CATH Right 10/29/2017   Procedure: Left Heart Cath;  Surgeon: Dionisio David, MD;  Location: [place] CV LAB;  Service: Cardiovascular;  Laterality: Right;  . TONSILLECTOMY      There were no vitals filed for this visit.  Subjective Assessment - 05/12/18 1105    Subjective  Patient reported that her knee feels better compared to last session, but that her knee always hurts. reported no falls.     Pertinent History    84 year old female with adenocarcinoma of the cervix as well as endometrial carcinoma and polycythemia vera as well as diabetes who had hypertensive right cerebellar hemorrhage onset 10/28/2017.  She was treated initially at [facility] and then transferred to [facility].  Neurosurgical evaluation per Dr. Ronnald Ramp concluded no surgery was needed.  The patient completed inpatient rehabilitation at [facility] (including SLP) 11/04/2017-11/18/2107 and was discharged at a 24-hour supervision level.  The  patient has hired caregiver to provide 24-hour supervision. Patient has received home health rehab services, including SLP.    Limitations  Standing;Walking    How long can you walk comfortably?  a few minutes    Currently in Pain?  Other (Comment)   Pt reports that her knees hurt when she walks, that they creak   Pain Location  Knee    Pain Orientation  Right;Left    Pain Descriptors / Indicators  Sore    Pain Onset  More than a month ago       Treatment:  Warm up on Nustep, BUE/BLE level 2 x4 min (unbilled);    Standing in parallel bars:   Standing on airex foam: -alternate toe taps to 4 inch step with 0 rail  assist x20 reps bilaterally; -Standing one foot on airex, one foot on 4 inch step, BUE ball pass  to self x15 - ball hand off with PT random direction ( L, R , up, diagonals) x20 on airex foam and 1 foot on 4 in step  -modified tandem stance: head turns side/side x10 reps each foot in front; -modified tandem stance, eyes closed 3x 10 sec hold, unsupported, CGA for safety  -Feet together,head turns up/down x10 reps unsupported, CGA for safety;  -feet apart on blue foam BUE 2# weighted stick shoulder flexion x10 reps with CGA for safety and cues to improve ankle strategies for better stance control  Standing on 1/2 foam: (Flat side up) -heel/toe rocks with feet apart heel/toe rocks x15 with rail assist for safety -tandem stance with 2-0 rail assist 10 sec hold x2 reps each foot in front with CGA to min A for safety and cues to improve erect posture and increase weight shift for better stance control   Instructed patient in dynamic balance exercise: Resisted walking, 7.5# forward/backward, side/side x2 forward/backward with minA1 to assist eccentric control during backward walking, L side ambulation x1 rep, challenged by eccentric control and complained of knee pain, Unable to perform R side stepping due to pain.    Exercise:  Leg press: BLE 65# 15 reps x1 sets; VCs for slowing eccentric return and performing movements slowly; Leg press, RLE only plate 35# W11 with cues for positioning; patient instructed in single leg, leg press to improve RLE control;  Leg press; heel raises 65# x20 reps with BLE; VCs for proper foot positioning and technique for exercise        PT Education - 05/12/18 1107    Education Details  exercise technique/form/balance    Person(s) Educated  Patient    Methods  Explanation;Demonstration;Verbal cues    Comprehension  Verbalized understanding;Returned demonstration;Verbal cues required;Need further instruction       PT Short Term Goals - 04/25/18 1158      PT  SHORT TERM GOAL #1   Title  Patient will reduce timed up and go to <11 seconds to reduce fall risk and demonstrate improved transfer/gait ability.    Baseline  22.07 sec; 03/24/18: 14.2sec; 04/25/18: 14.0 sec with RW    Time  2    Period  Weeks    Status  On-going    Target Date  06/06/18      PT SHORT TERM GOAL #2   Title  Patient will be independent with ascend/descend 2 steps using single UE in step over step pattern without LOB.    Baseline  03/24/18: CGA, single UE support, step-to pattern without LOB; 04/25/18: CGA, single UE support, step-over-step pattern without LOB  Time  2    Period  Weeks    Status  On-going    Target Date  06/06/18        PT Long Term Goals - 04/25/18 1204      PT LONG TERM GOAL #1   Title  Patient will be independent in home exercise program to improve strength/mobility for better functional independence with ADLs.    Baseline  04/25/18: unsure but does not remember doing HEP lately    Time  12    Period  Weeks    Status  On-going    Target Date  06/06/18      PT LONG TERM GOAL #2   Title  Patient (> 78 years old) will complete five times sit to stand test in < 15 seconds indicating an increased LE strength and improved balance    Baseline  34.21 sec; 03/24/2018: 22.9 sec; 04/25/18: 18.5 sec with BUE support    Time  12    Period  Weeks    Status  On-going    Target Date  06/06/18      PT LONG TERM GOAL #3   Title  Patient will increase six minute walk test distance to >1000 for progression to community ambulator and improve gait ability    Baseline  550 feet; 03/24/18: 795 ft.; 04/25/18: 975 ft with RW    Time  12    Period  Weeks    Status  On-going    Target Date  06/06/18      PT LONG TERM GOAL #4   Title  Patient will increase 10 meter walk test to >1.97m/s as to improve gait speed for better community ambulation and to reduce fall risk    Baseline  .49 m/sec; 03/24/18: 1.01 m/sec;    Time  12    Period  Weeks    Status  Achieved             Plan - 05/12/18 1151    Clinical Impression Statement  Patient most challenged by resisted walking today with balance, eccentric control and complained of knee pain during activity, remaining reps deferred deu to pain. Patient challenged by decrease base of support activities  as well as dual tasking during balance. CGA-minAx1 for all balance activities.    Rehab Potential  Fair    PT Frequency  2x / week    PT Duration  6 weeks    PT Treatment/Interventions  Patient/family education;Neuromuscular re-education;Balance training;Stair training;Therapeutic activities;Therapeutic exercise;Gait training;Aquatic Therapy    PT Next Visit Plan  balance and strengthening    PT Home Exercise Plan  unable to provide due to short Evaluation session scheduled    Consulted and Agree with Plan of Care  Patient       Patient will benefit from skilled therapeutic intervention in order to improve the following deficits and impairments:  Abnormal gait, Decreased balance, Decreased endurance, Decreased mobility, Impaired flexibility, Decreased strength, Decreased knowledge of use of DME, Decreased activity tolerance, Difficulty walking  Visit Diagnosis: Muscle weakness (generalized)  Difficulty in walking, not elsewhere classified     Problem List Patient Active Problem List   Diagnosis Date Noted  . Liver lesion 04/22/2018  . Goals of care, counseling/discussion 04/22/2018  . Cognitive deficit, post-stroke 12/31/2017  . Ataxia, post-stroke 12/31/2017  . Dysarthria, post-stroke   . Gait disturbance, post-stroke   . H/O cerebral parenchymal hemorrhage 11/04/2017  . Essential hypertension 11/04/2017  . Hyperlipidemia 11/04/2017  . Diabetes ([place]) 11/04/2017  .  CAD (coronary artery disease) 11/04/2017  . 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 Hypokalemia 11/04/2017  . Right-sided nontraumatic intracerebral hemorrhage of cerebellum ([place])   . History of cervical cancer   . History of  TIA (transient ischemic attack)   . Acute systolic congestive heart failure ([place])   . Reactive hypertension   . Hypernatremia   . Leukocytosis   . Acute blood loss anemia   . Elevated serum creatinine   . Acute respiratory failure with hypoxia ([place])   . IVH (intraventricular hemorrhage) ([place]) 10/29/2017  . Hypoxia 10/28/2017  . Pancreatic lesion 05/24/2017  . 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 Nodule of upper lobe of left lung 06/04/2015  . 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 Cerebral thrombosis with cerebral infarction 04/03/2015  . Cancer of right colon ([place]) 03/28/2015  . CVA (cerebral infarction) 12/21/2014  . Polycythemia vera ([place]) 06/22/2006    Lieutenant Diego PT, DPT 11:58 AM,05/12/18 [place] MAIN [facility] SERVICES [address] [place], [place], [address] Phone: [phone]   Fax:  [phone]  Name: SADHANA FRATER MRN: [id number] Date of Birth: April 20, 1932

## 2018-05-12 NOTE — Therapy (Addendum)
[place] MAIN [facility] SERVICES [address] [place], [place], [address] Phone: [phone]   Fax:  [phone]  Occupational Therapy Treatment  Patient Details  Name: [name] MRN: [id number] Date of Birth: [birth year] No data recorded  Encounter Date: 05/12/2018  OT End of Session - 05/12/18 1152    Visit Number  18    Number of Visits  24    Date for OT Re-Evaluation  06/20/18    Authorization Type  Medicare    Authorization Time Period  visit 8/10 for reporting period starting 04/07/2018    OT Start Time  1145    OT Stop Time  1230    OT Time Calculation (min)  45 min    Activity Tolerance  Patient tolerated treatment well    Behavior During Therapy  [facility] for tasks assessed/performed       Past Medical History:  Diagnosis Date  . Adenocarcinoma in situ of cervix   . Arthritis    hands  . Breast cancer ([place])   . Colon cancer ([place])   . Diabetes mellitus without complication ([place])   . Endometrial carcinoma (HCC)    s/p total abdominal hysterectomy  . H/O compression fracture of spine 2014   thoracic spine  . H/O polycythemia vera   . H/O TIA (transient ischemic attack) and stroke 09/2014, 03/2015   No deficits  . Hypertension   . Hyperuricemia   . Microalbuminuria   . Polycythemia vera ([place])   . Recurrent falls   . Skin cancer    face, legs  . Stroke [facility]) 2008   no deficits  . Trochanteric bursitis   . Varicose veins    treated    Past Surgical History:  Procedure Laterality Date  . ABDOMINAL HYSTERECTOMY    . BOWEL RESECTION N/A 03/28/2015   Procedure: SMALL BOWEL RESECTION;  Surgeon: Leonie Green, MD;  Location: [facility] ORS;  Service: General;  Laterality: N/A;  . CATARACT EXTRACTION W/ INTRAOCULAR LENS IMPLANT Right   . CATARACT EXTRACTION W/PHACO Left 10/07/2015   Procedure: CATARACT EXTRACTION PHACO AND INTRAOCULAR LENS PLACEMENT (IOC);  Surgeon: Ronnell Freshwater, MD;  Location: [place];  Service: Ophthalmology;  Laterality: Left;  DIABETIC - oral meds VISION BLUE  . CORONARY ANGIOGRAPHY N/A 10/29/2017   Procedure: CORONARY ANGIOGRAPHY;  Surgeon: Dionisio David, MD;  Location: [place] CV LAB;  Service: Cardiovascular;  Laterality: N/A;  . EXPLORATORY LAPAROTOMY     for fibroids  . LEFT HEART CATH Right 10/29/2017   Procedure: Left Heart Cath;  Surgeon: Dionisio David, MD;  Location: [place] CV LAB;  Service: Cardiovascular;  Laterality: Right;  . TONSILLECTOMY      There were no vitals filed for this visit.  Subjective Assessment - 05/12/18 1150    Subjective   Pt reports she is having a good day despite feeling more fatigued that usual.    Pertinent History  84 year old female with adenocarcinoma of the cervix as well as endometrial carcinoma and polycythemia vera as well as diabetes who had hypertensive right cerebellar hemorrhage onset 10/28/2017.  She was treated initially at [facility] and then transferred to [facility].  Neurosurgical evaluation per Dr. Ronnald Ramp concluded no surgery was needed.  The patient completed inpatient rehabilitation at [facility] (including SLP) 11/04/2017-11/18/2107 and was discharged at a 24-hour supervision level.  The patient has hired caregiver to provide 24-hour supervision. Patient has received home health rehab services,  including SLP.     Patient Stated Goals  Patient would like to be independent in all tasks and be able to live alone again.    Currently in Pain?  No/denies    Pain Score  0-No pain    Multiple Pain Sites  No      OT TREATMENT  Self-care:   Pt completed LB dressing including doffing and donning socks, shoes, and dressing loop to simulate pants. Pt. is able to complete LB dressing with modified independence when seated in a chair with arms and alternating sitting and standing using her walker. Pt reports not completing dressing independently at home due to having an aide. Pt worked on  writing task that required her to write sentences from a printed writing sample. Pt had difficulty staying oriented on the writing sample today. Pt demonstrated increased focus when writing in cursive. Pt was educated on strategies to increase her orientation on the writing sample including using her finger as a guide and positioning her paper closer to the writing sample. Pt demonstrated increased writing speed once implementing these strategies. Pt's legibility was ~50% however she did not significantly deviate from the nice nor margins today.                  OT Education - 05/12/18 1151    Education Details  Dressing skills, hand writing    Person(s) Educated  Patient    Methods  Explanation;Demonstration;Verbal cues    Comprehension  Verbalized understanding;Returned demonstration;Verbal cues required          OT Long Term Goals - 03/28/18 1412      OT LONG TERM GOAL #1   Title  Patient will complete bathing with modified independence    Baseline  Pt. continues to require ModA    Time  12    Period  Weeks    Status  On-going    Target Date  06/20/18      OT LONG TERM GOAL #2   Title  Patient will complete dressing skills with modified independence    Baseline  moderate assist    Time  12    Period  Weeks    Status  On-going    Target Date  06/20/18      OT LONG TERM GOAL #3   Title  Patient will complete light meal prep with min assist    Baseline  max assist    Time  12    Period  Weeks    Status  On-going    Target Date  06/20/18      OT LONG TERM GOAL #4   Title  Patient will increase R grip strength by 5# to open jars and containers with modified independence    Baseline  Pt. conitnues to have difficulaty opening jars, and containers.    Time  12    Period  Weeks    Status  On-going    Target Date  06/20/18      OT LONG TERM GOAL #5   Title  Patient will improve strength by 1 mm grade RUE to assist with obtaining items from the closet.      Baseline  4/5 overall RUE. Pt. conitnues to have difficulty obtaining items from the closet.    Time  8    Period  Weeks    Target Date  06/20/18      OT LONG TERM GOAL #6   Title  Pt will increase handwriting legibility and  speed to 50% while writing a 3 sentence paragraph in order to write thank you notes and birthday cards for friends and family    Baseline  03/14/2018:  25% legibility to writie a sentence. 50% legibility for her name    Time  4    Period  Weeks    Status  On-going    Target Date  06/20/18            Plan - 05/12/18 1156    Clinical Impression Statement  Pt reports feeling more fatigued today than usual however reports no pain. Pt's vitals were taken and found to be 95/58 mmHg and 62 bpm. Pt worked on dressing skills today. Pt is able to don/doff shoes, and socks with modified independence while seated in a chair with arms. Pt does not complete dressing at home due to relying on her aid to do so. Pt was educated on the importance of completing dressing independently when possible. Pt verbalizes understanding and agreement. Pt continues to work to improve writing legibility and speed when writing in print and cursive. Pt has identified that handwriting is of greater importance and priority to her than dressing and bathing skills.     Occupational Profile and client history currently impacting functional performance  family more than 30 mins away, has 24 hour caregiver assist, decreased awareness, memory and safety    Occupational performance deficits (Please refer to evaluation for details):  ADL's;IADL's;Social Participation    Rehab Potential  Good    Current Impairments/barriers affecting progress:  memory, requires 24 hour caregivers,     OT Frequency  2x / week    OT Duration  12 weeks    OT Treatment/Interventions  Self-care/ADL training;Cryotherapy;Therapeutic exercise;DME and/or AE instruction;Functional Mobility Training;Cognitive  remediation/compensation;Balance training;Neuromuscular education;Manual Therapy;Moist Heat;Therapeutic activities;Patient/family education    Clinical Decision Making  Several treatment options, min-mod task modification necessary    Consulted and Agree with Plan of Care  Patient    Family Member Consulted  caregiver       Patient will benefit from skilled therapeutic intervention in order to improve the following deficits and impairments:  Decreased balance, Decreased mobility, Difficulty walking, Decreased cognition, Decreased activity tolerance, Decreased coordination, Decreased safety awareness, Decreased strength, Impaired UE functional use  Visit Diagnosis: Muscle weakness (generalized)  Other lack of coordination    Problem List Patient Active Problem List   Diagnosis Date Noted  . Liver lesion 04/22/2018  . Goals of care, counseling/discussion 04/22/2018  . Cognitive deficit, post-stroke 12/31/2017  . Ataxia, post-stroke 12/31/2017  . Dysarthria, post-stroke   . Gait disturbance, post-stroke   . H/O cerebral parenchymal hemorrhage 11/04/2017  . Essential hypertension 11/04/2017  . Hyperlipidemia 11/04/2017  . Diabetes ([place]) 11/04/2017  . CAD (coronary artery disease) 11/04/2017  . 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 Hypokalemia 11/04/2017  . Right-sided nontraumatic intracerebral hemorrhage of cerebellum ([place])   . History of cervical cancer   . History of TIA (transient ischemic attack)   . Acute systolic congestive heart failure ([place])   . Reactive hypertension   . Hypernatremia   . Leukocytosis   . Acute blood loss anemia   . Elevated serum creatinine   . Acute respiratory failure with hypoxia ([place])   . IVH (intraventricular hemorrhage) ([place]) 10/29/2017  . Hypoxia 10/28/2017  . Pancreatic lesion 05/24/2017  . 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 Nodule of upper lobe of left lung 06/04/2015  . 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 Cerebral thrombosis with cerebral infarction 04/03/2015  . Cancer of right colon ([place]) 03/28/2015  . CVA (cerebral infarction) 12/21/2014  . Polycythemia vera ([place]) 06/22/2006   Oliver Hum, OTS 05/12/2018, 5:41 PM   This entire session was performed under direct supervision and direction of a licensed therapist/therapist assistant . I have personally read, edited and approve of the note as written.  Harrel Carina, MS, OTR/L  [place] MAIN [facility] SERVICES [address] [place], [place], [address] Phone: [phone]   Fax:  [phone]  Name: [name] MRN: [id number] Date of Birth: February 19, 1932

## 2018-05-16 ENCOUNTER — Encounter: Payer: Self-pay | Admitting: Occupational Therapy

## 2018-05-16 ENCOUNTER — Ambulatory Visit: Payer: Medicare Other | Admitting: Occupational Therapy

## 2018-05-16 ENCOUNTER — Encounter: Payer: Self-pay | Admitting: Physical Therapy

## 2018-05-16 ENCOUNTER — Ambulatory Visit: Payer: Medicare Other | Admitting: Physical Therapy

## 2018-05-16 DIAGNOSIS — M6281 Muscle weakness (generalized): Secondary | ICD-10-CM

## 2018-05-16 DIAGNOSIS — R278 Other lack of coordination: Secondary | ICD-10-CM

## 2018-05-16 DIAGNOSIS — R262 Difficulty in walking, not elsewhere classified: Secondary | ICD-10-CM

## 2018-05-16 NOTE — Therapy (Addendum)
[place] MAIN [facility] SERVICES [address] [place], [place], [address] Phone: [phone]   Fax:  [phone]  Occupational Therapy Treatment  Patient Details  Name: [name] MRN: [id number] Date of Birth: 1932/04/07 No data recorded  Encounter Date: 05/16/2018  OT End of Session - 05/16/18 1105    Visit Number  19    Number of Visits  24    Date for OT Re-Evaluation  06/20/18    Authorization Type  Medicare    Authorization Time Period  visit 9/10 for reporting period starting 04/07/2018    OT Start Time  1100    OT Stop Time  1145    OT Time Calculation (min)  45 min    Activity Tolerance  Patient tolerated treatment well    Behavior During Therapy  [facility] for tasks assessed/performed       Past Medical History:  Diagnosis Date  . Adenocarcinoma in situ of cervix   . Arthritis    hands  . Breast cancer ([place])   . Colon cancer ([place])   . Diabetes mellitus without complication ([place])   . Endometrial carcinoma (HCC)    s/p total abdominal hysterectomy  . H/O compression fracture of spine 2014   thoracic spine  . H/O polycythemia vera   . H/O TIA (transient ischemic attack) and stroke 09/2014, 03/2015   No deficits  . Hypertension   . Hyperuricemia   . Microalbuminuria   . Polycythemia vera ([place])   . Recurrent falls   . Skin cancer    face, legs  . Stroke [facility]) 2008   no deficits  . Trochanteric bursitis   . Varicose veins    treated    Past Surgical History:  Procedure Laterality Date  . ABDOMINAL HYSTERECTOMY    . BOWEL RESECTION N/A 03/28/2015   Procedure: SMALL BOWEL RESECTION;  Surgeon: Leonie Green, MD;  Location: [facility] ORS;  Service: General;  Laterality: N/A;  . CATARACT EXTRACTION W/ INTRAOCULAR LENS IMPLANT Right   . CATARACT EXTRACTION W/PHACO Left 10/07/2015   Procedure: CATARACT EXTRACTION PHACO AND INTRAOCULAR LENS PLACEMENT (IOC);  Surgeon: Ronnell Freshwater, MD;  Location: [place];  Service: Ophthalmology;  Laterality: Left;  DIABETIC - oral meds VISION BLUE  . CORONARY ANGIOGRAPHY N/A 10/29/2017   Procedure: CORONARY ANGIOGRAPHY;  Surgeon: Dionisio David, MD;  Location: [place] CV LAB;  Service: Cardiovascular;  Laterality: N/A;  . EXPLORATORY LAPAROTOMY     for fibroids  . LEFT HEART CATH Right 10/29/2017   Procedure: Left Heart Cath;  Surgeon: Dionisio David, MD;  Location: [place] CV LAB;  Service: Cardiovascular;  Laterality: Right;  . TONSILLECTOMY      There were no vitals filed for this visit.  Subjective Assessment - 05/16/18 1104    Subjective   Pt reports having a good day and enjoying a quiet weekend at home.    Pertinent History  84 year old female with adenocarcinoma of the cervix as well as endometrial carcinoma and polycythemia vera as well as diabetes who had hypertensive right cerebellar hemorrhage onset 10/28/2017.  She was treated initially at [facility] and then transferred to [facility].  Neurosurgical evaluation per Dr. Ronnald Ramp concluded no surgery was needed.  The patient completed inpatient rehabilitation at [facility] (including SLP) 11/04/2017-11/18/2107 and was discharged at a 24-hour supervision level.  The patient has hired caregiver to provide 24-hour supervision. Patient has received home health rehab services, including  SLP.     Patient Stated Goals  Patient would like to be independent in all tasks and be able to live alone again.    Currently in Pain?  No/denies    Pain Score  0-No pain    Multiple Pain Sites  No      OT TREATMENT  Self-care:  Pt. completed check writing task with increased time. Pt. was able to complete 2 checks in cursive form with ~50% legibility. Pt used a standard pen with a large grip and was able to grip the pen the entire time without repositioning. Pt then completed a check register form. Pt required increased verbal cuing to complete form. Pt reports that she is not  familiar with a check register. Pt required increased time for writing tasks today.    Therapeutic exercise:  Pt. Performed right hand gross gripping with grip strengthener. Pt. worked on sustaining grip while grasping pegs and reaching at various heights. Gripper was placed in the 1st resistive slot with the Krisi Azua resistive spring. Pt dropped x3 pegs towards the end of the task and reported increase hand fatigue. Pt used left hand to readjust gripper as she was unable to sustain her grip with increased hand fatigue. Pt. worked on pinch strengthening in the right hand for lateral, and 3pt. pinch using yellow, red, and green resistive clips. Pt. worked on placing the clips at various horizontal angles. Tactile and verbal cues were required for eliciting the desired movement. Pt required increased time to pinch the green clips and was unable to use them secondary to weakness as the task progressed.             OT Education - 05/16/18 1104    Education Details  Hand writing, hand strengthening    Person(s) Educated  Patient    Methods  Explanation;Demonstration;Verbal cues    Comprehension  Verbalized understanding;Returned demonstration;Verbal cues required          OT Long Term Goals - 03/28/18 1412      OT LONG TERM GOAL #1   Title  Patient will complete bathing with modified independence    Baseline  Pt. continues to require ModA    Time  12    Period  Weeks    Status  On-going    Target Date  06/20/18      OT LONG TERM GOAL #2   Title  Patient will complete dressing skills with modified independence    Baseline  moderate assist    Time  12    Period  Weeks    Status  On-going    Target Date  06/20/18      OT LONG TERM GOAL #3   Title  Patient will complete light meal prep with min assist    Baseline  max assist    Time  12    Period  Weeks    Status  On-going    Target Date  06/20/18      OT LONG TERM GOAL #4   Title  Patient will increase R grip strength by  5# to open jars and containers with modified independence    Baseline  Pt. conitnues to have difficulaty opening jars, and containers.    Time  12    Period  Weeks    Status  On-going    Target Date  06/20/18      OT LONG TERM GOAL #5   Title  Patient will improve strength by 1 mm grade RUE  to assist with obtaining items from the closet.     Baseline  4/5 overall RUE. Pt. conitnues to have difficulty obtaining items from the closet.    Time  8    Period  Weeks    Target Date  06/20/18      OT LONG TERM GOAL #6   Title  Pt will increase handwriting legibility and speed to 50% while writing a 3 sentence paragraph in order to write thank you notes and birthday cards for friends and family    Baseline  03/14/2018:  25% legibility to writie a sentence. 50% legibility for her name    Time  4    Period  Weeks    Status  On-going    Target Date  06/20/18            Plan - 05/16/18 1105    Clinical Impression Statement  Pt continues to work on Naval architect and speed. Pt worked on Psychologist, forensic including hand strengthening and various hand movements to promote independence and efficiency when writing. Pt presents with decreased right hand strength and limited [facility] which limits her handwriting. Pt to continue to work to develop these skills to increase independence when writing checks, notes to loved ones, and grocery lists.     Occupational Profile and client history currently impacting functional performance  family more than 30 mins away, has 24 hour caregiver assist, decreased awareness, memory and safety    Occupational performance deficits (Please refer to evaluation for details):  ADL's;IADL's;Social Participation    Rehab Potential  Good    Current Impairments/barriers affecting progress:  memory, requires 24 hour caregivers,     OT Frequency  2x / week    OT Duration  12 weeks    OT Treatment/Interventions  Self-care/ADL training;Cryotherapy;Therapeutic exercise;DME  and/or AE instruction;Functional Mobility Training;Cognitive remediation/compensation;Balance training;Neuromuscular education;Manual Therapy;Moist Heat;Therapeutic activities;Patient/family education    Clinical Decision Making  Several treatment options, min-mod task modification necessary    Consulted and Agree with Plan of Care  Patient    Family Member Consulted  caregiver       Patient will benefit from skilled therapeutic intervention in order to improve the following deficits and impairments:  Decreased balance, Decreased mobility, Difficulty walking, Decreased cognition, Decreased activity tolerance, Decreased coordination, Decreased safety awareness, Decreased strength, Impaired UE functional use  Visit Diagnosis: Muscle weakness (generalized)  Other lack of coordination    Problem List Patient Active Problem List   Diagnosis Date Noted  . Liver lesion 04/22/2018  . Goals of care, counseling/discussion 04/22/2018  . Cognitive deficit, post-stroke 12/31/2017  . Ataxia, post-stroke 12/31/2017  . Dysarthria, post-stroke   . Gait disturbance, post-stroke   . H/O cerebral parenchymal hemorrhage 11/04/2017  . Essential hypertension 11/04/2017  . Hyperlipidemia 11/04/2017  . Diabetes ([place]) 11/04/2017  . CAD (coronary artery disease) 11/04/2017  . 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 Hypokalemia 11/04/2017  . Right-sided nontraumatic intracerebral hemorrhage of cerebellum ([place])   . History of cervical cancer   . History of TIA (transient ischemic attack)   . Acute systolic congestive heart failure ([place])   . Reactive hypertension   . Hypernatremia   . Leukocytosis   . Acute blood loss anemia   . Elevated serum creatinine   . Acute respiratory failure with hypoxia ([place])   . IVH (intraventricular hemorrhage) ([place]) 10/29/2017  . Hypoxia 10/28/2017  . Pancreatic lesion 05/24/2017  . 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 Nodule of upper lobe of  left lung 06/04/2015  .  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 Cerebral thrombosis with cerebral infarction 04/03/2015  . Cancer of right colon ([place]) 03/28/2015  . CVA (cerebral infarction) 12/21/2014  . Polycythemia vera ([place]) 06/22/2006    Oliver Hum, OTS 05/16/2018, 11:14 AM   This entire session was performed under direct supervision and direction of a licensed therapist/therapist assistant . I have personally read, edited and approve of the note as written.  Harrel Carina, MS, OTR/L   [place] MAIN [facility] SERVICES [address] [place], [place], [address] Phone: [phone]   Fax:  [phone]  Name: CLETA HEATLEY MRN: [id number] Date of Birth: August 29, 1931

## 2018-05-16 NOTE — Therapy (Signed)
[place] [facility] SERVICES [address] [place], [place], [address] Phone: [phone]   Fax:  [phone]  Physical Therapy Treatment  Patient Details  Name: [name] MRN: [id number] Date of Birth: [birth year] Referring Provider (PT): Charlett Blake   Encounter Date: 05/16/2018  PT End of Session - 05/16/18 1157    Visit Number  18    Number of Visits  25    Date for PT Re-Evaluation  06/06/18    Authorization Type  5/10 last goals 04/25/18, start of care 01/31/18    PT Start Time  1145    PT Stop Time  1230    PT Time Calculation (min)  45 min    Equipment Utilized During Treatment  Gait belt    Activity Tolerance  Patient tolerated treatment well    Behavior During Therapy  WFL for tasks assessed/performed       Past Medical History:  Diagnosis Date  . Adenocarcinoma in situ of cervix   . Arthritis    hands  . Breast cancer ([place])   . Colon cancer ([place])   . Diabetes mellitus without complication ([place])   . Endometrial carcinoma (HCC)    s/p total abdominal hysterectomy  . H/O compression fracture of spine 2014   thoracic spine  . H/O polycythemia vera   . H/O TIA (transient ischemic attack) and stroke 09/2014, 03/2015   No deficits  . Hypertension   . Hyperuricemia   . Microalbuminuria   . Polycythemia vera ([place])   . Recurrent falls   . Skin cancer    face, legs  . Stroke [facility]) 2008   no deficits  . Trochanteric bursitis   . Varicose veins    treated    Past Surgical History:  Procedure Laterality Date  . ABDOMINAL HYSTERECTOMY    . BOWEL RESECTION N/A 03/28/2015   Procedure: SMALL BOWEL RESECTION;  Surgeon: Leonie Green, MD;  Location: [facility] OR[facility];  Service: General;  Laterality: N/A;  . CATARACT EXTRACTION W/ INTRAOCULAR LENS IMPLANT Right   . CATARACT EXTRACTION W/PHACO Left 10/07/2015   Procedure: CATARACT EXTRACTION PHACO AND INTRAOCULAR LENS PLACEMENT (IOC);  Surgeon: Ronnell Freshwater,  MD;  Location: [place];  Service: Ophthalmology;  Laterality: Left;  DIABETIC - oral meds VISION BLUE  . CORONARY ANGIOGRAPHY N/A 10/29/2017   Procedure: CORONARY ANGIOGRAPHY;  Surgeon: Dionisio David, MD;  Location: [place] [facility];  Service: Cardiovascular;  Laterality: N/A;  . EXPLORATORY LAPAROTOMY     for fibroids  . LEFT HEART CATH Right 10/29/2017   Procedure: Left Heart Cath;  Surgeon: Dionisio David, MD;  Location: [place] [facility];  Service: Cardiovascular;  Laterality: Right;  . TONSILLECTOMY      There were no vitals filed for this visit.  Subjective Assessment - 05/16/18 1156    Subjective  Patient reports still having trouble with her balance. She reports working on her handwriting this weekend and getting more frustrated. She reports her knee pain is better today;     Pertinent History    84 year old female with adenocarcinoma of the cervix as well as endometrial carcinoma and polycythemia vera as well as diabetes who had hypertensive right cerebellar hemorrhage onset 10/28/2017.  She was treated initially at [facility] and then transferred to [facility].  Neurosurgical evaluation per Dr. Ronnald Ramp concluded no surgery was needed.  The patient completed inpatient rehabilitation at [facility] (including SLP) 11/04/2017-11/18/2107 and was  discharged at a 24-hour supervision level.  The patient has hired caregiver to provide 24-hour supervision. Patient has received home health rehab services, including SLP.    Limitations  Standing;Walking    How long can you walk comfortably?  a few minutes    Currently in Pain?  No/denies    Pain Onset  More than a month ago    Multiple Pain Sites  No          Warm up on Nustep, BUE/BLE level 2 x5 min (unbilled);     Patient instructed in advanced balance exercise  Weaving around cones #5 x4 laps with min A for safety; required min VCs to increase step length and increase turn for better cone  negotiation Side stepping over cones #5 x1 lap each direction with min A for safety and mod VCs to increase step length and increase hip flexion for better foot clearance Picking up cones #5 , min A for safety with cues to avoid quick movement;   Gait outside in hallway: Forward walking with head turns up/down, side/side x80 feet each Patient exhibits unsteadiness with loss of balance with lateral head turns and slows down significantly when walking with vertical head turns;  Instructed patient in dynamic balance exercise: Resisted walking, 7.5# forward/backward, side/side x4 way, x2 laps each direction; required min A for safety and cues to improve weight shift especially with eccentric control for better balance control.    Exercise:  Leg press: BLE 60# 15 reps x1 sets; VCs for slowing eccentric return and performing movements slowly; Leg press, RLE only plate 35# K93 with cues for positioning; patient instructed in single leg, leg press to improve RLE control;  Leg press; heel raises 65# x20 reps with BLE; VCs for proper foot positioning and technique for exercise                      PT Education - 05/16/18 1156    Education Details  exercise technique, balance/gait safety    Person(s) Educated  Patient    Methods  Explanation;Demonstration;Verbal cues    Comprehension  Verbalized understanding;Returned demonstration;Verbal cues required;Need further instruction       PT Short Term Goals - 04/25/18 1158      PT SHORT TERM GOAL #1   Title  Patient will reduce timed up and go to <11 seconds to reduce fall risk and demonstrate improved transfer/gait ability.    Baseline  22.07 sec; 03/24/18: 14.2sec; 04/25/18: 14.0 sec with RW    Time  2    Period  Weeks    Status  On-going    Target Date  06/06/18      PT SHORT TERM GOAL #2   Title  Patient will be independent with ascend/descend 2 steps using single UE in step over step pattern without LOB.    Baseline  03/24/18:  CGA, single UE support, step-to pattern without LOB; 04/25/18: CGA, single UE support, step-over-step pattern without LOB    Time  2    Period  Weeks    Status  On-going    Target Date  06/06/18        PT Long Term Goals - 04/25/18 1204      PT LONG TERM GOAL #1   Title  Patient will be independent in home exercise program to improve strength/mobility for better functional independence with ADLs.    Baseline  04/25/18: unsure but does not remember doing HEP lately    Time  12    Period  Weeks    Status  On-going    Target Date  06/06/18      PT LONG TERM GOAL #2   Title  Patient (> 25 years old) will complete five times sit to stand test in < 15 seconds indicating an increased LE strength and improved balance    Baseline  34.21 sec; 03/24/2018: 22.9 sec; 04/25/18: 18.5 sec with BUE support    Time  12    Period  Weeks    Status  On-going    Target Date  06/06/18      PT LONG TERM GOAL #3   Title  Patient will increase six minute walk test distance to >1000 for progression to community ambulator and improve gait ability    Baseline  550 feet; 03/24/18: 795 ft.; 04/25/18: 975 ft with RW    Time  12    Period  Weeks    Status  On-going    Target Date  06/06/18      PT LONG TERM GOAL #4   Title  Patient will increase 10 meter walk test to >1.73m/s as to improve gait speed for better community ambulation and to reduce fall risk    Baseline  .49 m/sec; 03/24/18: 1.01 m/sec;    Time  12    Period  Weeks    Status  Achieved            Plan - 05/16/18 1202    Clinical Impression Statement  Patient instructed in advanced strengthening and balance exercise. She does require min Vcs for correct exercise technique; instructed patient in RLE single leg exercise to challenge RLE weakness. Patient able to ambulate short distances without her RW exhibiting good safety awareness. Instructed patient in gait without AD to assess dynamic balance. She does have difficulty with head turns with  loss of balance. Spoke with patient and caregiver and recommended patient continue to use RW at this time due to unsteadiness with dynamic tasks. She would benefit from additional skilled PT intervention to improve balance and gait safety;     Rehab Potential  Fair    PT Frequency  2x / week    PT Duration  6 weeks    PT Treatment/Interventions  Patient/family education;Neuromuscular re-education;Balance training;Stair training;Therapeutic activities;Therapeutic exercise;Gait training;Aquatic Therapy    PT Next Visit Plan  balance and strengthening    PT Home Exercise Plan  unable to provide due to short Evaluation session scheduled    Consulted and Agree with Plan of Care  Patient       Patient will benefit from skilled therapeutic intervention in order to improve the following deficits and impairments:  Abnormal gait, Decreased balance, Decreased endurance, Decreased mobility, Impaired flexibility, Decreased strength, Decreased knowledge of use of DME, Decreased activity tolerance, Difficulty walking  Visit Diagnosis: Muscle weakness (generalized)  Other lack of coordination  Difficulty in walking, not elsewhere classified     Problem List Patient Active Problem List   Diagnosis Date Noted  . Liver lesion 04/22/2018  . Goals of care, counseling/discussion 04/22/2018  . Cognitive deficit, post-stroke 12/31/2017  . Ataxia, post-stroke 12/31/2017  . Dysarthria, post-stroke   . Gait disturbance, post-stroke   . H/O cerebral parenchymal hemorrhage 11/04/2017  . Essential hypertension 11/04/2017  . Hyperlipidemia 11/04/2017  . Diabetes ([place]) 11/04/2017  . CAD (coronary artery disease) 11/04/2017  . 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 Hypokalemia 11/04/2017  . Right-sided nontraumatic intracerebral hemorrhage of cerebellum ([place])   .  History of cervical cancer   . History of TIA (transient ischemic attack)   . Acute systolic congestive heart failure ([place])   . Reactive  hypertension   . Hypernatremia   . Leukocytosis   . Acute blood loss anemia   . Elevated serum creatinine   . Acute respiratory failure with hypoxia ([place])   . IVH (intraventricular hemorrhage) ([place]) 10/29/2017  . Hypoxia 10/28/2017  . Pancreatic lesion 05/24/2017  . 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 Nodule of upper lobe of left lung 06/04/2015  . 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 Cerebral thrombosis with cerebral infarction 04/03/2015  . Cancer of right colon ([place]) 03/28/2015  . CVA (cerebral infarction) 12/21/2014  . Polycythemia vera ([place]) 06/22/2006    Trotter,Margaret PT, DPT 05/16/2018, 12:30 PM  [place] [facility] SERVICES [address] [place], [place], [address] Phone: [phone]   Fax:  [phone]  Name: DOMIQUE REARDON MRN: [id number] Date of Birth: August 22, 1931

## 2018-05-24 ENCOUNTER — Ambulatory Visit: Payer: Medicare Other | Admitting: Physical Therapy

## 2018-05-24 ENCOUNTER — Encounter: Payer: Self-pay | Admitting: Occupational Therapy

## 2018-05-24 ENCOUNTER — Encounter: Payer: Self-pay | Admitting: Physical Therapy

## 2018-05-24 ENCOUNTER — Ambulatory Visit: Payer: Medicare Other | Attending: Physical Medicine & Rehabilitation | Admitting: Occupational Therapy

## 2018-05-24 DIAGNOSIS — R278 Other lack of coordination: Secondary | ICD-10-CM

## 2018-05-24 DIAGNOSIS — R262 Difficulty in walking, not elsewhere classified: Secondary | ICD-10-CM

## 2018-05-24 DIAGNOSIS — M6281 Muscle weakness (generalized): Secondary | ICD-10-CM | POA: Diagnosis not present

## 2018-05-24 NOTE — Therapy (Signed)
[place] [facility] SERVICES [address] [place], [place], [address] Phone: [phone]   Fax:  [phone]  Physical Therapy Treatment  Patient Details  Name: [name] MRN: [id number] Date of Birth: 03-22-1932 Referring Provider (PT): Charlett Blake   Encounter Date: 05/24/2018  PT End of Session - 05/24/18 1438    Visit Number  19    Number of Visits  25    Date for PT Re-Evaluation  06/06/18    Authorization Type  6/10 last goals 04/25/18, start of care 01/31/18    PT Start Time  1432    PT Stop Time  1515    PT Time Calculation (min)  43 min    Equipment Utilized During Treatment  Gait belt    Activity Tolerance  Patient tolerated treatment well    Behavior During Therapy  WFL for tasks assessed/performed       Past Medical History:  Diagnosis Date  . Adenocarcinoma in situ of cervix   . Arthritis    hands  . Breast cancer ([place])   . Colon cancer ([place])   . Diabetes mellitus without complication ([place])   . Endometrial carcinoma (HCC)    s/p total abdominal hysterectomy  . H/O compression fracture of spine 2014   thoracic spine  . H/O polycythemia vera   . H/O TIA (transient ischemic attack) and stroke 09/2014, 03/2015   No deficits  . Hypertension   . Hyperuricemia   . Microalbuminuria   . Polycythemia vera ([place])   . Recurrent falls   . Skin cancer    face, legs  . Stroke [facility]) 2008   no deficits  . Trochanteric bursitis   . Varicose veins    treated    Past Surgical History:  Procedure Laterality Date  . ABDOMINAL HYSTERECTOMY    . BOWEL RESECTION N/A 03/28/2015   Procedure: SMALL BOWEL RESECTION;  Surgeon: Leonie Green, MD;  Location: [facility] ORS;  Service: General;  Laterality: N/A;  . CATARACT EXTRACTION W/ INTRAOCULAR LENS IMPLANT Right   . CATARACT EXTRACTION W/PHACO Left 10/07/2015   Procedure: CATARACT EXTRACTION PHACO AND INTRAOCULAR LENS PLACEMENT (IOC);  Surgeon: Ronnell Freshwater, MD;   Location: [place];  Service: Ophthalmology;  Laterality: Left;  DIABETIC - oral meds VISION BLUE  . CORONARY ANGIOGRAPHY N/A 10/29/2017   Procedure: CORONARY ANGIOGRAPHY;  Surgeon: Dionisio David, MD;  Location: [place] [facility];  Service: Cardiovascular;  Laterality: N/A;  . EXPLORATORY LAPAROTOMY     for fibroids  . LEFT HEART CATH Right 10/29/2017   Procedure: Left Heart Cath;  Surgeon: Dionisio David, MD;  Location: [place] [facility];  Service: Cardiovascular;  Laterality: Right;  . TONSILLECTOMY      There were no vitals filed for this visit.  Subjective Assessment - 05/24/18 1437    Subjective  Pt reports some soreness in her knees after last session but reports its gone now with no pain currently; Reports adherence to HEP; reports still feeling unsteady and "not walking right."     Pertinent History    84 year old female with adenocarcinoma of the cervix as well as endometrial carcinoma and polycythemia vera as well as diabetes who had hypertensive right cerebellar hemorrhage onset 10/28/2017.  She was treated initially at [facility] and then transferred to [facility].  Neurosurgical evaluation per Dr. Ronnald Ramp concluded no surgery was needed.  The patient completed inpatient rehabilitation at [facility] (including SLP)  11/04/2017-11/18/2107 and was discharged at a 24-hour supervision level.  The patient has hired caregiver to provide 24-hour supervision. Patient has received home health rehab services, including SLP.    Limitations  Standing;Walking    How long can you walk comfortably?  a few minutes    Currently in Pain?  No/denies    Pain Onset  More than a month ago    Multiple Pain Sites  No          Warm up on Nustep, BUE/BLE level 2 x5 min (unbilled);              Patient instructed in advanced balance exercise  Weaving around cones #5 x4 laps with min A to close supervision for safety; required min VCs to increase step length and  increase turn for better cone negotiation Stepping over box x4 reps with cues to increase hip flexion for foot clearance and to try and reduce slowing down for better dynamic balance ability with less stopping prior to stepping over and rather stepping over as approaching box. Side stepping over cones #5 x1 lap each direction with min A for safety and mod VCs to increase step length and increase hip flexion for better foot clearance   Gait outside in hallway: Forward walking with head turns up/down, side/side x160 feet each Patient initially exhibits unsteadiness with increased foot drag and impaired gait mechanics with slower gait speed; however with instruction to improve gait mechanics and then introduce head turns, patient able to exhibit better safety with dual task;   Instructed patient in dynamic balance exercise: Resisted walking,7.5# forward/backward, side/side x4way, x2 laps each direction; required min A for safety and cues to improve weight shift especially with eccentric control for better balance control. Patient also required cues to increase step length, especially with forward/backward walking for reciprocal gait technique;    Ladder drills: Forward reciprocal gait x4 laps Side stepping x1 lap each direction Patient required CGA to close supervision with exercise; able to do ladder drills without AD; patient required mod VCs for foot placement and sequencing; She was able to exhibit improved foot clearance with erect head/posture following cues and repetition;                       PT Education - 05/24/18 1437    Education Details  balance/gait safety    Person(s) Educated  Patient    Methods  Explanation;Verbal cues    Comprehension  Verbalized understanding;Returned demonstration;Verbal cues required;Need further instruction       PT Short Term Goals - 04/25/18 1158      PT SHORT TERM GOAL #1   Title  Patient will reduce timed up and go to <11  seconds to reduce fall risk and demonstrate improved transfer/gait ability.    Baseline  22.07 sec; 03/24/18: 14.2sec; 04/25/18: 14.0 sec with RW    Time  2    Period  Weeks    Status  On-going    Target Date  06/06/18      PT SHORT TERM GOAL #2   Title  Patient will be independent with ascend/descend 2 steps using single UE in step over step pattern without LOB.    Baseline  03/24/18: CGA, single UE support, step-to pattern without LOB; 04/25/18: CGA, single UE support, step-over-step pattern without LOB    Time  2    Period  Weeks    Status  On-going    Target Date  06/06/18  PT Long Term Goals - 04/25/18 1204      PT LONG TERM GOAL #1   Title  Patient will be independent in home exercise program to improve strength/mobility for better functional independence with ADLs.    Baseline  04/25/18: unsure but does not remember doing HEP lately    Time  12    Period  Weeks    Status  On-going    Target Date  06/06/18      PT LONG TERM GOAL #2   Title  Patient (> 27 years old) will complete five times sit to stand test in < 15 seconds indicating an increased LE strength and improved balance    Baseline  34.21 sec; 03/24/2018: 22.9 sec; 04/25/18: 18.5 sec with BUE support    Time  12    Period  Weeks    Status  On-going    Target Date  06/06/18      PT LONG TERM GOAL #3   Title  Patient will increase six minute walk test distance to >1000 for progression to community ambulator and improve gait ability    Baseline  550 feet; 03/24/18: 795 ft.; 04/25/18: 975 ft with RW    Time  12    Period  Weeks    Status  On-going    Target Date  06/06/18      PT LONG TERM GOAL #4   Title  Patient will increase 10 meter walk test to >1.24m/s as to improve gait speed for better community ambulation and to reduce fall risk    Baseline  .49 m/sec; 03/24/18: 1.01 m/sec;    Time  12    Period  Weeks    Status  Achieved            Plan - 05/24/18 1458    Clinical Impression Statement   Patient instructed in advanced gait training working on dynamic balance with resisted walking and walking with head turns. Patient initially exhibits increased veering and impaired gait mechanics when walking with head turns. However with increased instruction and repetition she was able to exhibit better gait mechanics with dual tasks with less veering. Pt continues to fatigue with prolonged ambulation especially with less assistance. she would benefit from additional skilled PT intervention to improve strength, balance and gait safety;     Rehab Potential  Fair    PT Frequency  2x / week    PT Duration  6 weeks    PT Treatment/Interventions  Patient/family education;Neuromuscular re-education;Balance training;Stair training;Therapeutic activities;Therapeutic exercise;Gait training;Aquatic Therapy    PT Next Visit Plan  balance and strengthening    PT Home Exercise Plan  unable to provide due to short Evaluation session scheduled    Consulted and Agree with Plan of Care  Patient       Patient will benefit from skilled therapeutic intervention in order to improve the following deficits and impairments:  Abnormal gait, Decreased balance, Decreased endurance, Decreased mobility, Impaired flexibility, Decreased strength, Decreased knowledge of use of DME, Decreased activity tolerance, Difficulty walking  Visit Diagnosis: Muscle weakness (generalized)  Other lack of coordination  Difficulty in walking, not elsewhere classified     Problem List Patient Active Problem List   Diagnosis Date Noted  . Liver lesion 04/22/2018  . Goals of care, counseling/discussion 04/22/2018  . Cognitive deficit, post-stroke 12/31/2017  . Ataxia, post-stroke 12/31/2017  . Dysarthria, post-stroke   . Gait disturbance, post-stroke   . H/O cerebral parenchymal hemorrhage 11/04/2017  . Essential hypertension  11/04/2017  . Hyperlipidemia 11/04/2017  . Diabetes ([place]) 11/04/2017  . CAD (coronary artery disease)  11/04/2017  . 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 Hypokalemia 11/04/2017  . Right-sided nontraumatic intracerebral hemorrhage of cerebellum ([place])   . History of cervical cancer   . History of TIA (transient ischemic attack)   . Acute systolic congestive heart failure ([place])   . Reactive hypertension   . Hypernatremia   . Leukocytosis   . Acute blood loss anemia   . Elevated serum creatinine   . Acute respiratory failure with hypoxia ([place])   . IVH (intraventricular hemorrhage) ([place]) 10/29/2017  . Hypoxia 10/28/2017  . Pancreatic lesion 05/24/2017  . 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 Nodule of upper lobe of left lung 06/04/2015  . 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 Cerebral thrombosis with cerebral infarction 04/03/2015  . Cancer of right colon ([place]) 03/28/2015  . CVA (cerebral infarction) 12/21/2014  . Polycythemia vera ([place]) 06/22/2006    Andruw Battie PT, DPT 05/25/2018, 8:34 AM   [place] [facility] SERVICES [address] [place], [place], [address] Phone: [phone]   Fax:  [phone]  Name: [name] MRN: [id number] Date of Birth: 02-09-1932

## 2018-05-24 NOTE — Therapy (Signed)
[place] MAIN [facility] SERVICES [address] [place], [place], [address] Phone: [phone]   Fax:  [phone]  Occupational Therapy Treatment/10th Visit Note  Patient Details  Name: [name] MRN: [id number] Date of Birth: 1931/09/24 No data recorded  Encounter Date: 05/24/2018  OT End of Session - 05/24/18 1521    Visit Number  20    Number of Visits  24    Date for OT Re-Evaluation  06/20/18    Authorization Type  Medicare    Authorization Time Period  visit 10/10 for reporting period starting 04/07/2018    OT Start Time  1515    OT Stop Time  1600    OT Time Calculation (min)  45 min    Activity Tolerance  Patient tolerated treatment well    Behavior During Therapy  [facility] for tasks assessed/performed       Past Medical History:  Diagnosis Date  . Adenocarcinoma in situ of cervix   . Arthritis    hands  . Breast cancer ([place])   . Colon cancer ([place])   . Diabetes mellitus without complication ([place])   . Endometrial carcinoma (HCC)    s/p total abdominal hysterectomy  . H/O compression fracture of spine 2014   thoracic spine  . H/O polycythemia vera   . H/O TIA (transient ischemic attack) and stroke 09/2014, 03/2015   No deficits  . Hypertension   . Hyperuricemia   . Microalbuminuria   . Polycythemia vera ([place])   . Recurrent falls   . Skin cancer    face, legs  . Stroke [facility]) 2008   no deficits  . Trochanteric bursitis   . Varicose veins    treated    Past Surgical History:  Procedure Laterality Date  . ABDOMINAL HYSTERECTOMY    . BOWEL RESECTION N/A 03/28/2015   Procedure: SMALL BOWEL RESECTION;  Surgeon: Leonie Green, MD;  Location: [facility] ORS;  Service: General;  Laterality: N/A;  . CATARACT EXTRACTION W/ INTRAOCULAR LENS IMPLANT Right   . CATARACT EXTRACTION W/PHACO Left 10/07/2015   Procedure: CATARACT EXTRACTION PHACO AND INTRAOCULAR LENS PLACEMENT (IOC);  Surgeon: Ronnell Freshwater, MD;  Location:  [place];  Service: Ophthalmology;  Laterality: Left;  DIABETIC - oral meds VISION BLUE  . CORONARY ANGIOGRAPHY N/A 10/29/2017   Procedure: CORONARY ANGIOGRAPHY;  Surgeon: Dionisio David, MD;  Location: [place] CV LAB;  Service: Cardiovascular;  Laterality: N/A;  . EXPLORATORY LAPAROTOMY     for fibroids  . LEFT HEART CATH Right 10/29/2017   Procedure: Left Heart Cath;  Surgeon: Dionisio David, MD;  Location: [place] CV LAB;  Service: Cardiovascular;  Laterality: Right;  . TONSILLECTOMY      There were no vitals filed for this visit.  Subjective Assessment - 05/24/18 1520    Subjective   Pt. reports having had a good Thanksgiving.    Pertinent History  84 year old female with adenocarcinoma of the cervix as well as endometrial carcinoma and polycythemia vera as well as diabetes who had hypertensive right cerebellar hemorrhage onset 10/28/2017.  She was treated initially at [facility] and then transferred to [facility].  Neurosurgical evaluation per Dr. Ronnald Ramp concluded no surgery was needed.  The patient completed inpatient rehabilitation at [facility] (including SLP) 11/04/2017-11/18/2107 and was discharged at a 24-hour supervision level.  The patient has hired caregiver to provide 24-hour supervision. Patient has received home health rehab services, including SLP.  Patient Stated Goals  Patient would like to be independent in all tasks and be able to live alone again.    Currently in Pain?  No/denies         [facility] OT Assessment - 05/24/18 1524      Coordination   Right 9 Hole Peg Test  56   Dropped one peg   Left 9 Hole Peg Test  35    Box and Blocks  56      Strength   Overall Strength Comments  Bilateral shoulder abduction 4/5, shoulder flexion, elbow flexion, and extension 4+/5      Hand Function   Right Hand Grip (lbs)  19    Right Hand Lateral Pinch  8 lbs    Right Hand 3 Point Pinch  11 lbs    Left Hand Grip (lbs)  23    Left  Hand Lateral Pinch  8 lbs    Left 3 point pinch  10 lbs      Measurements were obtained, and goals were reviewed with the patient.  OT TREATMENT    Selfcare:  Pt. worked on Media planner filling in checks. Pt.'s writing was 50% legible.                    OT Education - 05/24/18 1520    Education Details  Hand writing, hand strengthening    Person(s) Educated  Patient    Methods  Explanation;Demonstration;Verbal cues    Comprehension  Verbalized understanding;Returned demonstration;Verbal cues required          OT Long Term Goals - 05/24/18 1537      OT LONG TERM GOAL #1   Title  Patient will complete bathing with modified independence    Baseline  05/24/2018: Pt. requires assist with her back only.    Time  12    Period  Weeks    Status  On-going    Target Date  06/20/18      OT LONG TERM GOAL #2   Title  Patient will complete dressing skills with modified independence    Baseline  05/24/2018: Pt. requires minA.    Time  12    Period  Weeks    Status  On-going    Target Date  06/20/18      OT LONG TERM GOAL #3   Title  Patient will complete light meal prep with min assist    Baseline  [id number]: Conitnues to require caregiver assist for meal prep.    Time  12    Period  Weeks    Status  On-going    Target Date  06/20/18      OT LONG TERM GOAL #4   Title  Patient will increase R grip strength by 5# to open jars and containers with modified independence    Baseline  Pt. continues to have difficulty opening jars, and containers.    Time  12    Period  Weeks    Status  On-going    Target Date  06/20/18      OT LONG TERM GOAL #5   Title  Patient will improve strength by 1 mm grade RUE to assist with obtaining items from the closet.     Baseline  4/5 overall RUE. Pt. continues to have difficulty obtaining items from the closet.    Time  8    Period  Weeks    Status  On-going    Target Date  06/20/18  OT LONG TERM GOAL #6   Title   Pt will increase handwriting legibility and speed to 50% while writing a 3 sentence paragraph in order to write thank you notes and birthday cards for friends and family    Baseline  03/14/2018:  25% legibility to writie a sentence. 50% legibility for her name    Time  4    Period  Weeks    Status  On-going    Target Date  06/20/18            Plan - 05/24/18 1522    Clinical Impression Statement  Pt. is making progress with basic ADL tasks. Pt. continues to have difficulty with meal preparation, writing legibility, and speed. Pt. has improved with left grip strength, and bilateral 2pt. pinch strength. Pt. continues to work on improving UE strength, and [facility] skills to improve functional use during ADL, and IADL tasks.     Occupational Profile and client history currently impacting functional performance  family more than 30 mins away, has 24 hour caregiver assist, decreased awareness, memory and safety    Occupational performance deficits (Please refer to evaluation for details):  ADL's;IADL's;Social Participation    Rehab Potential  Good    Current Impairments/barriers affecting progress:  memory, requires 24 hour caregivers,     OT Frequency  2x / week    OT Duration  12 weeks    OT Treatment/Interventions  Self-care/ADL training;Cryotherapy;Therapeutic exercise;DME and/or AE instruction;Functional Mobility Training;Cognitive remediation/compensation;Balance training;Neuromuscular education;Manual Therapy;Moist Heat;Therapeutic activities;Patient/family education    Clinical Decision Making  Several treatment options, min-mod task modification necessary    Consulted and Agree with Plan of Care  Patient    Family Member Consulted  caregiver       Patient will benefit from skilled therapeutic intervention in order to improve the following deficits and impairments:  Decreased balance, Decreased mobility, Difficulty walking, Decreased cognition, Decreased activity tolerance, Decreased  coordination, Decreased safety awareness, Decreased strength, Impaired UE functional use  Visit Diagnosis: Muscle weakness (generalized)    Problem List Patient Active Problem List   Diagnosis Date Noted  . Liver lesion 04/22/2018  . Goals of care, counseling/discussion 04/22/2018  . Cognitive deficit, post-stroke 12/31/2017  . Ataxia, post-stroke 12/31/2017  . Dysarthria, post-stroke   . Gait disturbance, post-stroke   . H/O cerebral parenchymal hemorrhage 11/04/2017  . Essential hypertension 11/04/2017  . Hyperlipidemia 11/04/2017  . Diabetes ([place]) 11/04/2017  . CAD (coronary artery disease) 11/04/2017  . 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 Hypokalemia 11/04/2017  . Right-sided nontraumatic intracerebral hemorrhage of cerebellum ([place])   . History of cervical cancer   . History of TIA (transient ischemic attack)   . Acute systolic congestive heart failure ([place])   . Reactive hypertension   . Hypernatremia   . Leukocytosis   . Acute blood loss anemia   . Elevated serum creatinine   . Acute respiratory failure with hypoxia ([place])   . IVH (intraventricular hemorrhage) ([place]) 10/29/2017  . Hypoxia 10/28/2017  . Pancreatic lesion 05/24/2017  . 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 Nodule of upper lobe of left lung 06/04/2015  . 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 Cerebral thrombosis with cerebral infarction 04/03/2015  . Cancer of right colon ([place]) 03/28/2015  . CVA (cerebral infarction) 12/21/2014  . Polycythemia vera ([place]) 06/22/2006    Harrel Carina, MS, OTR/L 05/24/2018, 3:55 PM  [place] MAIN [facility] SERVICES [place],  [place], [address] Phone: [phone]   Fax:  [phone]  Name: [name] MRN: [id number] Date of Birth: 01-Sep-1931

## 2018-05-26 ENCOUNTER — Ambulatory Visit: Payer: Medicare Other | Admitting: Occupational Therapy

## 2018-05-26 DIAGNOSIS — M6281 Muscle weakness (generalized): Secondary | ICD-10-CM

## 2018-05-26 DIAGNOSIS — R278 Other lack of coordination: Secondary | ICD-10-CM

## 2018-05-26 NOTE — Therapy (Addendum)
[place] MAIN [facility] SERVICES [address] [place], [place], [address] Phone: [phone]   Fax:  [phone]  Occupational Therapy Treatment  Patient Details  Name: [name] MRN: [id number] Date of Birth: Nov 28, 1931 No data recorded  Encounter Date: 05/26/2018  OT End of Session - 05/26/18 1152    Visit Number  21    Number of Visits  24    Date for OT Re-Evaluation  06/20/18    Authorization Type  Medicare    Authorization Time Period  visit 1/10 for reporting period starting 05/26/2018    OT Start Time  1147    OT Stop Time  1230    OT Time Calculation (min)  43 min    Activity Tolerance  Patient tolerated treatment well    Behavior During Therapy  [facility] for tasks assessed/performed       Past Medical History:  Diagnosis Date  . Adenocarcinoma in situ of cervix   . Arthritis    hands  . Breast cancer ([place])   . Colon cancer ([place])   . Diabetes mellitus without complication ([place])   . Endometrial carcinoma (HCC)    s/p total abdominal hysterectomy  . H/O compression fracture of spine 2014   thoracic spine  . H/O polycythemia vera   . H/O TIA (transient ischemic attack) and stroke 09/2014, 03/2015   No deficits  . Hypertension   . Hyperuricemia   . Microalbuminuria   . Polycythemia vera ([place])   . Recurrent falls   . Skin cancer    face, legs  . Stroke [facility]) 2008   no deficits  . Trochanteric bursitis   . Varicose veins    treated    Past Surgical History:  Procedure Laterality Date  . ABDOMINAL HYSTERECTOMY    . BOWEL RESECTION N/A 03/28/2015   Procedure: SMALL BOWEL RESECTION;  Surgeon: Leonie Green, MD;  Location: [facility] ORS;  Service: General;  Laterality: N/A;  . CATARACT EXTRACTION W/ INTRAOCULAR LENS IMPLANT Right   . CATARACT EXTRACTION W/PHACO Left 10/07/2015   Procedure: CATARACT EXTRACTION PHACO AND INTRAOCULAR LENS PLACEMENT (IOC);  Surgeon: Ronnell Freshwater, MD;  Location: [place];   Service: Ophthalmology;  Laterality: Left;  DIABETIC - oral meds VISION BLUE  . CORONARY ANGIOGRAPHY N/A 10/29/2017   Procedure: CORONARY ANGIOGRAPHY;  Surgeon: Dionisio David, MD;  Location: [place] CV LAB;  Service: Cardiovascular;  Laterality: N/A;  . EXPLORATORY LAPAROTOMY     for fibroids  . LEFT HEART CATH Right 10/29/2017   Procedure: Left Heart Cath;  Surgeon: Dionisio David, MD;  Location: [place] CV LAB;  Service: Cardiovascular;  Laterality: Right;  . TONSILLECTOMY      There were no vitals filed for this visit.  Subjective Assessment - 05/26/18 1151    Subjective   Pt. reports she is having difficulty signing her name.    Pertinent History  84 year old female with adenocarcinoma of the cervix as well as endometrial carcinoma and polycythemia vera as well as diabetes who had hypertensive right cerebellar hemorrhage onset 10/28/2017.  She was treated initially at [facility] and then transferred to [facility].  Neurosurgical evaluation per Dr. Ronnald Ramp concluded no surgery was needed.  The patient completed inpatient rehabilitation at [facility] (including SLP) 11/04/2017-11/18/2107 and was discharged at a 24-hour supervision level.  The patient has hired caregiver to provide 24-hour supervision. Patient has received home health rehab services, including SLP.  Patient Stated Goals  Patient would like to be independent in all tasks and be able to live alone again.    Currently in Pain?  No/denies      OT TREATMENT:  Selfcare:  Pt. worked on Building surveyor, writing speed, legibility, and spacing while copying recipes. Pt. required increased time to list the ingredients. Pt. had difficulty completing with written instructions.  Neuromuscular Re-education:   Pt. performed [facility] tasks using the grooved pegboard. Pt. worked on grasping the grooved pegs from a horizontal position, and moving the pegs to a vertical position in the hand to prepare for placing  them in the grooved slot. Pt. Worked on removing the pegs alternating with the tip of her 2nd through 5th digits.                        OT Education - 05/26/18 1151    Education Details  Hand writing, hand strengthening    Person(s) Educated  Patient    Methods  Explanation;Demonstration;Verbal cues    Comprehension  Verbalized understanding;Returned demonstration;Verbal cues required          OT Long Term Goals - 05/24/18 1537      OT LONG TERM GOAL #1   Title  Patient will complete bathing with modified independence    Baseline  05/24/2018: Pt. requires assist with her back only.    Time  12    Period  Weeks    Status  On-going    Target Date  06/20/18      OT LONG TERM GOAL #2   Title  Patient will complete dressing skills with modified independence    Baseline  05/24/2018: Pt. requires minA.    Time  12    Period  Weeks    Status  On-going    Target Date  06/20/18      OT LONG TERM GOAL #3   Title  Patient will complete light meal prep with min assist    Baseline  [id number]: Conitnues to require caregiver assist for meal prep.    Time  12    Period  Weeks    Status  On-going    Target Date  06/20/18      OT LONG TERM GOAL #4   Title  Patient will increase R grip strength by 5# to open jars and containers with modified independence    Baseline  Pt. continues to have difficulty opening jars, and containers.    Time  12    Period  Weeks    Status  On-going    Target Date  06/20/18      OT LONG TERM GOAL #5   Title  Patient will improve strength by 1 mm grade RUE to assist with obtaining items from the closet.     Baseline  4/5 overall RUE. Pt. continues to have difficulty obtaining items from the closet.    Time  8    Period  Weeks    Status  On-going    Target Date  06/20/18      OT LONG TERM GOAL #6   Title  Pt will increase handwriting legibility and speed to 50% while writing a 3 sentence paragraph in order to write thank you notes  and birthday cards for friends and family    Baseline  03/14/2018:  50% legibility to writie a sentence.     Time  4    Period  Weeks    Status  On-going    Target Date  06/20/18            Plan - 05/26/18 1152    Clinical Impression Statement  Pt. continues to have difficulty with right hand FMC, speed, and dexterity. Pt. has difficulty manipulating small objects, writing legibly, and efficiently, Pt. has difficulty writing out Christmas cards, and signing her name. Pt. continues to work on improving hand function skills for use during ADls, and IADLs.      Occupational Profile and client history currently impacting functional performance  family more than 30 mins away, has 24 hour caregiver assist, decreased awareness, memory and safety    Occupational performance deficits (Please refer to evaluation for details):  ADL's;IADL's;Social Participation    Rehab Potential  Good    OT Frequency  2x / week    OT Duration  12 weeks    OT Treatment/Interventions  Self-care/ADL training;Cryotherapy;Therapeutic exercise;DME and/or AE instruction;Functional Mobility Training;Cognitive remediation/compensation;Balance training;Neuromuscular education;Manual Therapy;Moist Heat;Therapeutic activities;Patient/family education    Clinical Decision Making  Several treatment options, min-mod task modification necessary    Consulted and Agree with Plan of Care  Patient    Family Member Consulted  Caregiver       Patient will benefit from skilled therapeutic intervention in order to improve the following deficits and impairments:  Decreased balance, Decreased mobility, Difficulty walking, Decreased cognition, Decreased activity tolerance, Decreased coordination, Decreased safety awareness, Decreased strength, Impaired UE functional use  Visit Diagnosis: Muscle weakness (generalized)  Other lack of coordination    Problem List Patient Active Problem List   Diagnosis Date Noted  . Liver lesion  04/22/2018  . Goals of care, counseling/discussion 04/22/2018  . Cognitive deficit, post-stroke 12/31/2017  . Ataxia, post-stroke 12/31/2017  . Dysarthria, post-stroke   . Gait disturbance, post-stroke   . H/O cerebral parenchymal hemorrhage 11/04/2017  . Essential hypertension 11/04/2017  . Hyperlipidemia 11/04/2017  . Diabetes ([place]) 11/04/2017  . CAD (coronary artery disease) 11/04/2017  . 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 Hypokalemia 11/04/2017  . Right-sided nontraumatic intracerebral hemorrhage of cerebellum ([place])   . History of cervical cancer   . History of TIA (transient ischemic attack)   . Acute systolic congestive heart failure ([place])   . Reactive hypertension   . Hypernatremia   . Leukocytosis   . Acute blood loss anemia   . Elevated serum creatinine   . Acute respiratory failure with hypoxia ([place])   . IVH (intraventricular hemorrhage) ([place]) 10/29/2017  . Hypoxia 10/28/2017  . Pancreatic lesion 05/24/2017  . 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 Nodule of upper lobe of left lung 06/04/2015  . 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 Cerebral thrombosis with cerebral infarction 04/03/2015  . Cancer of right colon ([place]) 03/28/2015  . CVA (cerebral infarction) 12/21/2014  . Polycythemia vera ([place]) 06/22/2006    Harrel Carina, MS, OTR/L 05/26/2018, 12:02 PM  [place] MAIN [facility] SERVICES [address] [place], [place], [address] Phone: [phone]   Fax:  [phone]  Name: LESHIA KOPE MRN: [id number] Date of Birth: 1932-06-02

## 2018-05-30 ENCOUNTER — Ambulatory Visit: Payer: Medicare Other | Admitting: Occupational Therapy

## 2018-05-30 ENCOUNTER — Encounter: Payer: Self-pay | Admitting: Occupational Therapy

## 2018-05-30 ENCOUNTER — Ambulatory Visit: Payer: Medicare Other

## 2018-05-30 DIAGNOSIS — M6281 Muscle weakness (generalized): Secondary | ICD-10-CM | POA: Diagnosis not present

## 2018-05-30 DIAGNOSIS — R278 Other lack of coordination: Secondary | ICD-10-CM

## 2018-05-30 DIAGNOSIS — R262 Difficulty in walking, not elsewhere classified: Secondary | ICD-10-CM

## 2018-05-30 NOTE — Therapy (Signed)
[place] MAIN [facility] SERVICES [address] [place], [place], [address] Phone: [phone]   Fax:  [phone]  Physical Therapy Treatment  Patient Details  Name: [name]  MRN: [id number] Date of Birth: [birth year] Referring Provider (PT): Charlett Blake   Encounter Date: 05/30/2018  PT End of Session - 05/30/18 1207    Visit Number  20    Number of Visits  25    Date for PT Re-Evaluation  06/06/18    Authorization Type  8/10 last goals 04/25/18, start of care 01/31/18    PT Start Time  1115    PT Stop Time  1200    PT Time Calculation (min)  45 min    Equipment Utilized During Treatment  Gait belt    Activity Tolerance  Patient tolerated treatment well    Behavior During Therapy  [facility] for tasks assessed/performed       Past Medical History:  Diagnosis Date  . Adenocarcinoma in situ of cervix   . Arthritis    hands  . Breast cancer ([place])   . Colon cancer ([place])   . Diabetes mellitus without complication ([place])   . Endometrial carcinoma (HCC)    s/p total abdominal hysterectomy  . H/O compression fracture of spine 2014   thoracic spine  . H/O polycythemia vera   . H/O TIA (transient ischemic attack) and stroke 09/2014, 03/2015   No deficits  . Hypertension   . Hyperuricemia   . Microalbuminuria   . Polycythemia vera ([place])   . Recurrent falls   . Skin cancer    face, legs  . Stroke [facility]) 2008   no deficits  . Trochanteric bursitis   . Varicose veins    treated    Past Surgical History:  Procedure Laterality Date  . ABDOMINAL HYSTERECTOMY    . BOWEL RESECTION N/A 03/28/2015   Procedure: SMALL BOWEL RESECTION;  Surgeon: Leonie Green, MD;  Location: [facility] ORS;  Service: General;  Laterality: N/A;  . CATARACT EXTRACTION W/ INTRAOCULAR LENS IMPLANT Right   . CATARACT EXTRACTION W/PHACO Left 10/07/2015   Procedure: CATARACT EXTRACTION PHACO AND INTRAOCULAR LENS PLACEMENT (IOC);  Surgeon: Ronnell Freshwater,  MD;  Location: [place];  Service: Ophthalmology;  Laterality: Left;  DIABETIC - oral meds VISION BLUE  . CORONARY ANGIOGRAPHY N/A 10/29/2017   Procedure: CORONARY ANGIOGRAPHY;  Surgeon: Dionisio David, MD;  Location: [place] CV LAB;  Service: Cardiovascular;  Laterality: N/A;  . EXPLORATORY LAPAROTOMY     for fibroids  . LEFT HEART CATH Right 10/29/2017   Procedure: Left Heart Cath;  Surgeon: Dionisio David, MD;  Location: [place] CV LAB;  Service: Cardiovascular;  Laterality: Right;  . TONSILLECTOMY      There were no vitals filed for this visit.  Subjective Assessment - 05/30/18 1119    Subjective  Pt reported some soreness in her knees, L > R. Stated it feels like muscle soreness, not pain. No falls/medication changes from prior session.    Pertinent History    84 year old female with adenocarcinoma of the cervix as well as endometrial carcinoma and polycythemia vera as well as diabetes who had hypertensive right cerebellar hemorrhage onset 10/28/2017.  She was treated initially at [facility] and then transferred to [facility].  Neurosurgical evaluation per Dr. Ronnald Ramp concluded no surgery was needed.  The patient completed inpatient rehabilitation at [facility] (including SLP) 11/04/2017-11/18/2107 and was discharged at a 24-hour  supervision level.  The patient has hired caregiver to provide 24-hour supervision. Patient has received home health rehab services, including SLP.    Limitations  Standing;Walking    How long can you walk comfortably?  a few minutes    Patient Stated Goals  Patient wants to walk better and not need the RW.     Currently in Pain?  No/denies       TREATMENT:  Warm up on Nustep, BUE/BLE level 2 x6 min (unbilled);     Weaving around cones #5 x3laps with min A/CGA for safety; required min VCs to increase step length, upright posture and to increase turn for better cone negotiation Weaving around cones #5 x 2 laps with  distance between cones decreased to facilitate increased turn   Gait outside in hallway: Forward walking with head turns up/down, side/side x160 feet each. Pt needed verbal cues to continue to turn head during gait, as well as cues to decrease gait speed for safety. Occasionally stopped activity to allow pt to regain balance. MinAx1-CGA for safety.    Resisted walking, 7.5# forward/backward, side/side x4 way, x 2 laps for forward/backward, x1 lap for side/side due to reported increased R knee pain.Rrequired min A for safety and cues to improve weight shift especially with eccentric control for better balance control. Verbal cues to increase step length.    Ladder drills: Forward reciprocal gait x6 laps Side stepping x2 lap each direction Patient required CGA to close supervision with exercise; improved quality of movement (step length, balance) with repetition.     PT Education - 05/30/18 1121    Education Details  balance, gait, and safety    Person(s) Educated  Patient    Methods  Explanation;Verbal cues    Comprehension  Verbalized understanding;Returned demonstration;Verbal cues required;Need further instruction       PT Short Term Goals - 04/25/18 1158      PT SHORT TERM GOAL #1   Title  Patient will reduce timed up and go to <11 seconds to reduce fall risk and demonstrate improved transfer/gait ability.    Baseline  22.07 sec; 03/24/18: 14.2sec; 04/25/18: 14.0 sec with RW    Time  2    Period  Weeks    Status  On-going    Target Date  06/06/18      PT SHORT TERM GOAL #2   Title  Patient will be independent with ascend/descend 2 steps using single UE in step over step pattern without LOB.    Baseline  03/24/18: CGA, single UE support, step-to pattern without LOB; 04/25/18: CGA, single UE support, step-over-step pattern without LOB    Time  2    Period  Weeks    Status  On-going    Target Date  06/06/18        PT Long Term Goals - 04/25/18 1204      PT LONG TERM GOAL #1    Title  Patient will be independent in home exercise program to improve strength/mobility for better functional independence with ADLs.    Baseline  04/25/18: unsure but does not remember doing HEP lately    Time  12    Period  Weeks    Status  On-going    Target Date  06/06/18      PT LONG TERM GOAL #2   Title  Patient (> 41 years old) will complete five times sit to stand test in < 15 seconds indicating an increased LE strength and improved balance  Baseline  34.21 sec; 03/24/2018: 22.9 sec; 04/25/18: 18.5 sec with BUE support    Time  12    Period  Weeks    Status  On-going    Target Date  06/06/18      PT LONG TERM GOAL #3   Title  Patient will increase six minute walk test distance to >1000 for progression to community ambulator and improve gait ability    Baseline  550 feet; 03/24/18: 795 ft.; 04/25/18: 975 ft with RW    Time  12    Period  Weeks    Status  On-going    Target Date  06/06/18      PT LONG TERM GOAL #4   Title  Patient will increase 10 meter walk test to >1.61m/s as to improve gait speed for better community ambulation and to reduce fall risk    Baseline  .49 m/sec; 03/24/18: 1.01 m/sec;    Time  12    Period  Weeks    Status  Achieved            Plan - 05/30/18 1205    Clinical Impression Statement  Pt had most difficulty as well as complaints of pain with resisted side stepping bilaterally, R > L. Further reps of activity deferred. Pt needed mod verbal/visual cues for gait mechanics (stride length, foot placement, upright posture) throughout session, most veering noted with gait and head turns. Pt fatigued at end of session. The patient would benefit from further skilled PT to continue to progress towards goals to maximize safety, mobility, and gait.     PT Frequency  2x / week    PT Duration  6 weeks    PT Treatment/Interventions  Patient/family education;Neuromuscular re-education;Balance training;Stair training;Therapeutic activities;Therapeutic  exercise;Gait training;Aquatic Therapy    PT Next Visit Plan  balance and strengthening    PT Home Exercise Plan  unable to provide due to short Evaluation session scheduled    Consulted and Agree with Plan of Care  Patient       Patient will benefit from skilled therapeutic intervention in order to improve the following deficits and impairments:  Abnormal gait, Decreased balance, Decreased endurance, Decreased mobility, Impaired flexibility, Decreased strength, Decreased knowledge of use of DME, Decreased activity tolerance, Difficulty walking  Visit Diagnosis: Muscle weakness (generalized)  Difficulty in walking, not elsewhere classified     Problem List Patient Active Problem List   Diagnosis Date Noted  . Liver lesion 04/22/2018  . Goals of care, counseling/discussion 04/22/2018  . Cognitive deficit, post-stroke 12/31/2017  . Ataxia, post-stroke 12/31/2017  . Dysarthria, post-stroke   . Gait disturbance, post-stroke   . H/O cerebral parenchymal hemorrhage 11/04/2017  . Essential hypertension 11/04/2017  . Hyperlipidemia 11/04/2017  . Diabetes ([place]) 11/04/2017  . CAD (coronary artery disease) 11/04/2017  . 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 Hypokalemia 11/04/2017  . Right-sided nontraumatic intracerebral hemorrhage of cerebellum ([place])   . History of cervical cancer   . History of TIA (transient ischemic attack)   . Acute systolic congestive heart failure ([place])   . Reactive hypertension   . Hypernatremia   . Leukocytosis   . Acute blood loss anemia   . Elevated serum creatinine   . Acute respiratory failure with hypoxia ([place])   . IVH (intraventricular hemorrhage) ([place]) 10/29/2017  . Hypoxia 10/28/2017  . Pancreatic lesion 05/24/2017  . 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 Nodule of upper lobe of left lung 06/04/2015  . 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 Cerebral thrombosis with cerebral infarction 04/03/2015  . Cancer of  right colon ([place]) 03/28/2015  . CVA (cerebral infarction) 12/21/2014  . Polycythemia vera ([place]) 06/22/2006   Lieutenant Diego PT, DPT 12:13 PM,05/30/18 [place] MAIN [facility] SERVICES [address] [place], [place], [address] Phone: [phone]   Fax:  [phone]  Name: MEGAHN KILLINGS MRN: [id number] Date of Birth: 1931/07/13

## 2018-05-30 NOTE — Therapy (Signed)
[place] MAIN [facility] SERVICES [address] [place], [place], [address] Phone: [phone]   Fax:  [phone]  Occupational Therapy Treatment  Patient Details  Name: [name] MRN: [id number] Date of Birth: 10/15/1931 No data recorded  Encounter Date: 05/30/2018  OT End of Session - 06/01/18 0856    Visit Number  22    Number of Visits  24    Date for OT Re-Evaluation  06/20/18    Authorization Type  Medicare    Authorization Time Period  visit 2/10 for reporting period starting 05/26/2018    OT Start Time  1019    OT Stop Time  1100    OT Time Calculation (min)  41 min    Activity Tolerance  Patient tolerated treatment well    Behavior During Therapy  [facility] for tasks assessed/performed       Past Medical History:  Diagnosis Date  . Adenocarcinoma in situ of cervix   . Arthritis    hands  . Breast cancer ([place])   . Colon cancer ([place])   . Diabetes mellitus without complication ([place])   . Endometrial carcinoma (HCC)    s/p total abdominal hysterectomy  . H/O compression fracture of spine 2014   thoracic spine  . H/O polycythemia vera   . H/O TIA (transient ischemic attack) and stroke 09/2014, 03/2015   No deficits  . Hypertension   . Hyperuricemia   . Microalbuminuria   . Polycythemia vera ([place])   . Recurrent falls   . Skin cancer    face, legs  . Stroke [facility]) 2008   no deficits  . Trochanteric bursitis   . Varicose veins    treated    Past Surgical History:  Procedure Laterality Date  . ABDOMINAL HYSTERECTOMY    . BOWEL RESECTION N/A 03/28/2015   Procedure: SMALL BOWEL RESECTION;  Surgeon: Leonie Green, MD;  Location: [facility] ORS;  Service: General;  Laterality: N/A;  . CATARACT EXTRACTION W/ INTRAOCULAR LENS IMPLANT Right   . CATARACT EXTRACTION W/PHACO Left 10/07/2015   Procedure: CATARACT EXTRACTION PHACO AND INTRAOCULAR LENS PLACEMENT (IOC);  Surgeon: Ronnell Freshwater, MD;  Location: [place];   Service: Ophthalmology;  Laterality: Left;  DIABETIC - oral meds VISION BLUE  . CORONARY ANGIOGRAPHY N/A 10/29/2017   Procedure: CORONARY ANGIOGRAPHY;  Surgeon: Dionisio David, MD;  Location: [place] CV LAB;  Service: Cardiovascular;  Laterality: N/A;  . EXPLORATORY LAPAROTOMY     for fibroids  . LEFT HEART CATH Right 10/29/2017   Procedure: Left Heart Cath;  Surgeon: Dionisio David, MD;  Location: [place] CV LAB;  Service: Cardiovascular;  Laterality: Right;  . TONSILLECTOMY      There were no vitals filed for this visit.  Subjective Assessment - 06/01/18 0856    Subjective   Patient states she needs to get some things typed since she can't really write.     Pertinent History  84 year old female with adenocarcinoma of the cervix as well as endometrial carcinoma and polycythemia vera as well as diabetes who had hypertensive right cerebellar hemorrhage onset 10/28/2017.  She was treated initially at [facility] and then transferred to [facility].  Neurosurgical evaluation per Dr. Ronnald Ramp concluded no surgery was needed.  The patient completed inpatient rehabilitation at [facility] (including SLP) 11/04/2017-11/18/2107 and was discharged at a 24-hour supervision level.  The patient has hired caregiver to provide 24-hour supervision. Patient has received home health rehab  services, including SLP.     Patient Stated Goals  Patient would like to be independent in all tasks and be able to live alone again.    Currently in Pain?  No/denies    Pain Score  0-No pain      Patient seen this date with emphasis on manipulation skills and completion of Judy board/100 pegboard designs, 2 designs completed,  First one more simplistic and 2nd one more complex.  She required occasional cues for following design, as well as occasional Cues for prehension patterns for turning and manipulating pieces to place into board.      Handwriting with 10 items from the grocery store with  use of right dominant hand, she continues to demonstrate   Poor legibility with handwriting, responds well to cues for taking her time, printing items and use of lined paper For guidance.                  OT Education - 06/01/18 0856    Education Details  Hand writing, hand strengthening    Person(s) Educated  Patient    Methods  Explanation;Demonstration;Verbal cues    Comprehension  Verbalized understanding;Returned demonstration;Verbal cues required          OT Long Term Goals - 05/24/18 1537      OT LONG TERM GOAL #1   Title  Patient will complete bathing with modified independence    Baseline  05/24/2018: Pt. requires assist with her back only.    Time  12    Period  Weeks    Status  On-going    Target Date  06/20/18      OT LONG TERM GOAL #2   Title  Patient will complete dressing skills with modified independence    Baseline  05/24/2018: Pt. requires minA.    Time  12    Period  Weeks    Status  On-going    Target Date  06/20/18      OT LONG TERM GOAL #3   Title  Patient will complete light meal prep with min assist    Baseline  [id number]: Conitnues to require caregiver assist for meal prep.    Time  12    Period  Weeks    Status  On-going    Target Date  06/20/18      OT LONG TERM GOAL #4   Title  Patient will increase R grip strength by 5# to open jars and containers with modified independence    Baseline  Pt. continues to have difficulty opening jars, and containers.    Time  12    Period  Weeks    Status  On-going    Target Date  06/20/18      OT LONG TERM GOAL #5   Title  Patient will improve strength by 1 mm grade RUE to assist with obtaining items from the closet.     Baseline  4/5 overall RUE. Pt. continues to have difficulty obtaining items from the closet.    Time  8    Period  Weeks    Status  On-going    Target Date  06/20/18      OT LONG TERM GOAL #6   Title  Pt will increase handwriting legibility and speed to 50% while  writing a 3 sentence paragraph in order to write thank you notes and birthday cards for friends and family    Baseline  03/14/2018:  50% legibility to writie a sentence.  Time  4    Period  Weeks    Status  On-going    Target Date  06/20/18            Plan - 06/01/18 0017    Clinical Impression Statement  Patient continues to struggle with legibility of handwriting skills however performs best with lined paper, verbal cues and printing of letters rather than script. Patient completed 2 designs with large Bethena Roys board with manipulation of pieces with cues for turning, slow to complete at times and will need to continue to work on speed and dexterity of movements.      Occupational Profile and client history currently impacting functional performance  family more than 30 mins away, has 24 hour caregiver assist, decreased awareness, memory and safety    Occupational performance deficits (Please refer to evaluation for details):  ADL's;IADL's;Social Participation    Rehab Potential  Good    Current Impairments/barriers affecting progress:  memory, requires 24 hour caregivers,     OT Frequency  2x / week    OT Duration  12 weeks    OT Treatment/Interventions  Self-care/ADL training;Cryotherapy;Therapeutic exercise;DME and/or AE instruction;Functional Mobility Training;Cognitive remediation/compensation;Balance training;Neuromuscular education;Manual Therapy;Moist Heat;Therapeutic activities;Patient/family education    Consulted and Agree with Plan of Care  Patient       Patient will benefit from skilled therapeutic intervention in order to improve the following deficits and impairments:  Decreased balance, Decreased mobility, Difficulty walking, Decreased cognition, Decreased activity tolerance, Decreased coordination, Decreased safety awareness, Decreased strength, Impaired UE functional use  Visit Diagnosis: Muscle weakness (generalized)  Other lack of coordination    Problem  List Patient Active Problem List   Diagnosis Date Noted  . Liver lesion 04/22/2018  . Goals of care, counseling/discussion 04/22/2018  . Cognitive deficit, post-stroke 12/31/2017  . Ataxia, post-stroke 12/31/2017  . Dysarthria, post-stroke   . Gait disturbance, post-stroke   . H/O cerebral parenchymal hemorrhage 11/04/2017  . Essential hypertension 11/04/2017  . Hyperlipidemia 11/04/2017  . Diabetes ([place]) 11/04/2017  . CAD (coronary artery disease) 11/04/2017  . 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 Hypokalemia 11/04/2017  . Right-sided nontraumatic intracerebral hemorrhage of cerebellum ([place])   . History of cervical cancer   . History of TIA (transient ischemic attack)   . Acute systolic congestive heart failure ([place])   . Reactive hypertension   . Hypernatremia   . Leukocytosis   . Acute blood loss anemia   . Elevated serum creatinine   . Acute respiratory failure with hypoxia ([place])   . IVH (intraventricular hemorrhage) ([place]) 10/29/2017  . Hypoxia 10/28/2017  . Pancreatic lesion 05/24/2017  . 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 Nodule of upper lobe of left lung 06/04/2015  . 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 Cerebral thrombosis with cerebral infarction 04/03/2015  . Cancer of right colon ([place]) 03/28/2015  . CVA (cerebral infarction) 12/21/2014  . Polycythemia vera ([place]) 06/22/2006   Angel Hobdy T Tomasita Morrow, OTR/L, CLT  Felcia Huebert 06/01/2018, 9:03 AM  [place] MAIN [facility] SERVICES [address] [place], [place], [address] Phone: [phone]   Fax:  [phone]  Name: [name] MRN: [id number] Date of Birth: 08/15/1931

## 2018-06-01 ENCOUNTER — Ambulatory Visit: Payer: Medicare Other

## 2018-06-01 ENCOUNTER — Encounter: Payer: Self-pay | Admitting: Occupational Therapy

## 2018-06-01 ENCOUNTER — Ambulatory Visit: Payer: Medicare Other | Admitting: Occupational Therapy

## 2018-06-01 VITALS — BP 95/49 | HR 68

## 2018-06-01 DIAGNOSIS — M6281 Muscle weakness (generalized): Secondary | ICD-10-CM

## 2018-06-01 DIAGNOSIS — R278 Other lack of coordination: Secondary | ICD-10-CM

## 2018-06-01 DIAGNOSIS — R262 Difficulty in walking, not elsewhere classified: Secondary | ICD-10-CM

## 2018-06-01 NOTE — Therapy (Signed)
[place] MAIN [facility] SERVICES [address] [place], [place], [address] Phone: [phone]   Fax:  [phone]  Occupational Therapy Treatment  Patient Details  Name: [name] MRN: [id number] Date of Birth: 07/31/1931 No data recorded  Encounter Date: 06/01/2018  OT End of Session - 06/01/18 1358    Visit Number  23    Number of Visits  24    Date for OT Re-Evaluation  06/20/18    Authorization Type  Medicare    Authorization Time Period  visit 3/10 for reporting period starting 05/26/2018    OT Start Time  1346    OT Stop Time  1430    OT Time Calculation (min)  44 min    Activity Tolerance  Patient tolerated treatment well    Behavior During Therapy  [facility] for tasks assessed/performed       Past Medical History:  Diagnosis Date  . Adenocarcinoma in situ of cervix   . Arthritis    hands  . Breast cancer ([place])   . Colon cancer ([place])   . Diabetes mellitus without complication ([place])   . Endometrial carcinoma (HCC)    s/p total abdominal hysterectomy  . H/O compression fracture of spine 2014   thoracic spine  . H/O polycythemia vera   . H/O TIA (transient ischemic attack) and stroke 09/2014, 03/2015   No deficits  . Hypertension   . Hyperuricemia   . Microalbuminuria   . Polycythemia vera ([place])   . Recurrent falls   . Skin cancer    face, legs  . Stroke [facility]) 2008   no deficits  . Trochanteric bursitis   . Varicose veins    treated    Past Surgical History:  Procedure Laterality Date  . ABDOMINAL HYSTERECTOMY    . BOWEL RESECTION N/A 03/28/2015   Procedure: SMALL BOWEL RESECTION;  Surgeon: Leonie Green, MD;  Location: [facility] ORS;  Service: General;  Laterality: N/A;  . CATARACT EXTRACTION W/ INTRAOCULAR LENS IMPLANT Right   . CATARACT EXTRACTION W/PHACO Left 10/07/2015   Procedure: CATARACT EXTRACTION PHACO AND INTRAOCULAR LENS PLACEMENT (IOC);  Surgeon: Ronnell Freshwater, MD;  Location: [place];  Service: Ophthalmology;  Laterality: Left;  DIABETIC - oral meds VISION BLUE  . CORONARY ANGIOGRAPHY N/A 10/29/2017   Procedure: CORONARY ANGIOGRAPHY;  Surgeon: Dionisio David, MD;  Location: [place] CV LAB;  Service: Cardiovascular;  Laterality: N/A;  . EXPLORATORY LAPAROTOMY     for fibroids  . LEFT HEART CATH Right 10/29/2017   Procedure: Left Heart Cath;  Surgeon: Dionisio David, MD;  Location: [place] CV LAB;  Service: Cardiovascular;  Laterality: Right;  . TONSILLECTOMY      There were no vitals filed for this visit.  Subjective Assessment - 06/01/18 1355    Subjective   Pt. repors having difficulty writing out her Christmas cards.    Pertinent History  84 year old female with adenocarcinoma of the cervix as well as endometrial carcinoma and polycythemia vera as well as diabetes who had hypertensive right cerebellar hemorrhage onset 10/28/2017.  She was treated initially at [facility] and then transferred to [facility].  Neurosurgical evaluation per Dr. Ronnald Ramp concluded no surgery was needed.  The patient completed inpatient rehabilitation at [facility] (including SLP) 11/04/2017-11/18/2107 and was discharged at a 24-hour supervision level.  The patient has hired caregiver to provide 24-hour supervision. Patient has received home health rehab services, including SLP.  Patient Stated Goals  Patient would like to be independent in all tasks and be able to live alone again.    Currently in Pain?  No/denies    Pain Score  0-No pain      OT TREATMENT    Selfcare:  Pt. worked on Media planner during check writing tasks. Pt. required verbal cues for the proper date. Legibility is improved today, although increased time was required to complete each check.  Neuromuscular re-education:  Pt. worked on bilateral [facility] skills needed to grasp small resistive beads. Pt. worked on connecting the beads using a 3pt. pinch, and pt. Pinch grasp.                              OT Education - 06/01/18 1358    Education Details  Hand writing, hand strengthening    Person(s) Educated  Patient    Methods  Explanation;Demonstration;Verbal cues    Comprehension  Verbalized understanding;Returned demonstration;Verbal cues required          OT Long Term Goals - 05/24/18 1537      OT LONG TERM GOAL #1   Title  Patient will complete bathing with modified independence    Baseline  05/24/2018: Pt. requires assist with her back only.    Time  12    Period  Weeks    Status  On-going    Target Date  06/20/18      OT LONG TERM GOAL #2   Title  Patient will complete dressing skills with modified independence    Baseline  05/24/2018: Pt. requires minA.    Time  12    Period  Weeks    Status  On-going    Target Date  06/20/18      OT LONG TERM GOAL #3   Title  Patient will complete light meal prep with min assist    Baseline  [id number]: Conitnues to require caregiver assist for meal prep.    Time  12    Period  Weeks    Status  On-going    Target Date  06/20/18      OT LONG TERM GOAL #4   Title  Patient will increase R grip strength by 5# to open jars and containers with modified independence    Baseline  Pt. continues to have difficulty opening jars, and containers.    Time  12    Period  Weeks    Status  On-going    Target Date  06/20/18      OT LONG TERM GOAL #5   Title  Patient will improve strength by 1 mm grade RUE to assist with obtaining items from the closet.     Baseline  4/5 overall RUE. Pt. continues to have difficulty obtaining items from the closet.    Time  8    Period  Weeks    Status  On-going    Target Date  06/20/18      OT LONG TERM GOAL #6   Title  Pt will increase handwriting legibility and speed to 50% while writing a 3 sentence paragraph in order to write thank you notes and birthday cards for friends and family    Baseline  03/14/2018:  50% legibility to writie a sentence.     Time   4    Period  Weeks    Status  On-going    Target Date  06/20/18  Plan - 06/01/18 1359    Clinical Impression Statement  Pt. continues to have difficulty with writing legibility, and speed. Pt. fluctuated between printed form, and handwriting when completing checks. Pt. continues to work on improving overall [facility] skills to improve ADLs, IADLs, writing, and typing.     Occupational Profile and client history currently impacting functional performance  family more than 30 mins away, has 24 hour caregiver assist, decreased awareness, memory and safety    Occupational performance deficits (Please refer to evaluation for details):  ADL's;IADL's;Social Participation    Rehab Potential  Good    Current Impairments/barriers affecting progress:  memory, requires 24 hour caregivers,     OT Frequency  2x / week    OT Duration  12 weeks    OT Treatment/Interventions  Self-care/ADL training;Cryotherapy;Therapeutic exercise;DME and/or AE instruction;Functional Mobility Training;Cognitive remediation/compensation;Balance training;Neuromuscular education;Manual Therapy;Moist Heat;Therapeutic activities;Patient/family education    Consulted and Agree with Plan of Care  Patient    Family Member Consulted  Caregiver       Patient will benefit from skilled therapeutic intervention in order to improve the following deficits and impairments:  Decreased balance, Decreased mobility, Difficulty walking, Decreased cognition, Decreased activity tolerance, Decreased coordination, Decreased safety awareness, Decreased strength, Impaired UE functional use  Visit Diagnosis: Muscle weakness (generalized)  Other lack of coordination    Problem List Patient Active Problem List   Diagnosis Date Noted  . Liver lesion 04/22/2018  . Goals of care, counseling/discussion 04/22/2018  . Cognitive deficit, post-stroke 12/31/2017  . Ataxia, post-stroke 12/31/2017  . Dysarthria, post-stroke   . Gait  disturbance, post-stroke   . H/O cerebral parenchymal hemorrhage 11/04/2017  . Essential hypertension 11/04/2017  . Hyperlipidemia 11/04/2017  . Diabetes ([place]) 11/04/2017  . CAD (coronary artery disease) 11/04/2017  . 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 Hypokalemia 11/04/2017  . Right-sided nontraumatic intracerebral hemorrhage of cerebellum ([place])   . History of cervical cancer   . History of TIA (transient ischemic attack)   . Acute systolic congestive heart failure ([place])   . Reactive hypertension   . Hypernatremia   . Leukocytosis   . Acute blood loss anemia   . Elevated serum creatinine   . Acute respiratory failure with hypoxia ([place])   . IVH (intraventricular hemorrhage) ([place]) 10/29/2017  . Hypoxia 10/28/2017  . Pancreatic lesion 05/24/2017  . 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 Nodule of upper lobe of left lung 06/04/2015  . 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 Cerebral thrombosis with cerebral infarction 04/03/2015  . Cancer of right colon ([place]) 03/28/2015  . CVA (cerebral infarction) 12/21/2014  . Polycythemia vera ([place]) 06/22/2006    Harrel Carina 06/01/2018, 2:09 PM  [place] MAIN [facility] SERVICES [address] [place], [place], [address] Phone: [phone]   Fax:  [phone]  Name: [name] MRN: [id number] Date of Birth: [birth year]

## 2018-06-01 NOTE — Therapy (Signed)
[place] MAIN [facility] SERVICES [address] [place], [place], [address] Phone: [phone]   Fax:  [phone]  Physical Therapy Treatment/Progress Note/Recertification  Dates of reporting period  04/25/18   to   06/01/18    Patient Details  Name: [name] MRN: [id number] Date of Birth: 03/05/1932 Referring Provider (PT): Charlett Blake   Encounter Date: 06/01/2018  PT End of Session - 06/01/18 1426    Visit Number  21    Number of Visits  25    Date for PT Re-Evaluation  07/27/18    Authorization Type  9/10 progress note, next visit is 1/10, start of care 01/31/18, last goals 26/33/35 (recert)    PT Start Time  1430    PT Stop Time  1515    PT Time Calculation (min)  45 min    Equipment Utilized During Treatment  --    Activity Tolerance  Patient tolerated treatment well    Behavior During Therapy  [facility] for tasks assessed/performed       Past Medical History:  Diagnosis Date  . Adenocarcinoma in situ of cervix   . Arthritis    hands  . Breast cancer ([place])   . Colon cancer ([place])   . Diabetes mellitus without complication ([place])   . Endometrial carcinoma (HCC)    s/p total abdominal hysterectomy  . H/O compression fracture of spine 2014   thoracic spine  . H/O polycythemia vera   . H/O TIA (transient ischemic attack) and stroke 09/2014, 03/2015   No deficits  . Hypertension   . Hyperuricemia   . Microalbuminuria   . Polycythemia vera ([place])   . Recurrent falls   . Skin cancer    face, legs  . Stroke [facility] 2008   no deficits  . Trochanteric bursitis   . Varicose veins    treated    Past Surgical History:  Procedure Laterality Date  . ABDOMINAL HYSTERECTOMY    . BOWEL RESECTION N/A 03/28/2015   Procedure: SMALL BOWEL RESECTION;  Surgeon: Leonie Green, MD;  Location: [facility] ORS;  Service: General;  Laterality: N/A;  . CATARACT EXTRACTION W/ INTRAOCULAR LENS IMPLANT Right   . CATARACT EXTRACTION W/PHACO Left  10/07/2015   Procedure: CATARACT EXTRACTION PHACO AND INTRAOCULAR LENS PLACEMENT (IOC);  Surgeon: Ronnell Freshwater, MD;  Location: [place];  Service: Ophthalmology;  Laterality: Left;  DIABETIC - oral meds VISION BLUE  . CORONARY ANGIOGRAPHY N/A 10/29/2017   Procedure: CORONARY ANGIOGRAPHY;  Surgeon: Dionisio David, MD;  Location: [place] CV LAB;  Service: Cardiovascular;  Laterality: N/A;  . EXPLORATORY LAPAROTOMY     for fibroids  . LEFT HEART CATH Right 10/29/2017   Procedure: Left Heart Cath;  Surgeon: Dionisio David, MD;  Location: [place] CV LAB;  Service: Cardiovascular;  Laterality: Right;  . TONSILLECTOMY      Vitals:   06/01/18 1440  BP: (!) 95/49  Pulse: 68  SpO2: 100%    Subjective Assessment - 06/01/18 1426    Subjective  No falls/medication changes from prior session. No health changes reported. No specific questions or concerns. Pt denies pain.    Pertinent History    84 year old female with adenocarcinoma of the cervix as well as endometrial carcinoma and polycythemia vera as well as diabetes who had hypertensive right cerebellar hemorrhage onset 10/28/2017.  She was treated initially at [facility] and then transferred to [facility].  Neurosurgical evaluation per Dr.  Jones concluded no surgery was needed.  The patient completed inpatient rehabilitation at [facility] (including SLP) 11/04/2017-11/18/2107 and was discharged at a 24-hour supervision level.  The patient has hired caregiver to provide 24-hour supervision. Patient has received home health rehab services, including SLP.    Limitations  Standing;Walking    How long can you walk comfortably?  a few minutes    Patient Stated Goals  Patient wants to walk better and not need the RW.     Currently in Pain?  No/denies         [facility] PT Assessment - 06/01/18 1459      6 Minute Walk- Baseline   6 Minute Walk- Baseline  yes      6 Minute walk- Post Test   6 Minute  Walk Post Test  yes    BP (mmHg)  148/68    HR (bpm)  68    02 Sat (%RA)  97 %    Modified Borg Scale for Dyspnea  5- Strong or hard breathing    Perceived Rate of Exertion (Borg)  --   Pt unable to understand scale     6 minute walk test results    Aerobic Endurance Distance Walked  1040    Endurance additional comments  Pt breathing heavy at end of walking distance      Standardized Balance Assessment   Standardized Balance Assessment  Berg Balance Test;Timed Up and Go Test;Five Times Sit to Stand;10 meter walk test    Five times sit to stand comments   15.5s    10 Meter Walk  self-selected: 9.7s = 1.03 m/s, fastest: 8.0s = 1.25 m/s      Berg Balance Test   Sit to Stand  Able to stand  independently using hands    Standing Unsupported  Able to stand safely 2 minutes    Sitting with Back Unsupported but Feet Supported on Floor or Stool  Able to sit safely and securely 2 minutes    Stand to Sit  Controls descent by using hands    Transfers  Able to transfer safely, definite need of hands    Standing Unsupported with Eyes Closed  Able to stand 10 seconds safely    Standing Ubsupported with Feet Together  Able to place feet together independently and stand 1 minute safely    From Standing, Reach Forward with Outstretched Arm  Can reach confidently >25 cm (10")    From Standing Position, Pick up Object from Floor  Able to pick up shoe safely and easily    From Standing Position, Turn to Look Behind Over each Shoulder  Looks behind from both sides and weight shifts well    Turn 360 Degrees  Able to turn 360 degrees safely but slowly    Standing Unsupported, Alternately Place Feet on Step/Stool  Able to complete >2 steps/needs minimal assist    Standing Unsupported, One Foot in Front  Able to plae foot ahead of the other independently and hold 30 seconds    Standing on One Leg  Tries to lift leg/unable to hold 3 seconds but remains standing independently    Total Score  44      Timed Up  and Go Test   TUG  Normal TUG    Normal TUG (seconds)  12.7              TREATMENT  Ther-ex  Performed outcome measures with patient including 93mgait speed (self-selected: 9.7s = 1.03 m/s,  fastest: 8.0s = 1.25 m/s), TUG (12.7s) , 5TSTS (15.5s with BUE support, still unable to perform STS without UE support), 6MWT = 1040', and BERG: 44/56    Today's session focused on performing outcome measures with patient and updating goals. She demonstrates continued improvement in 10 meter gait speed, Timed Up and Go, Five Time Sit to Stand, and Six Minute Walk Test distance. She is still unable to perform sit to stand without UE assistance. She continues to utilize a rolling walker for ambulation and she would like to be able to ambulate without an assistive device. Performed BERG with patient and she scored 44/56 indicating that she is at high risk for falls. Pt encouraged to continued HEP and follow-up as scheduled. She will benefit from further skilled PT to continue to progress towards goals to maximize safety, mobility, and gait.                    PT Short Term Goals - 06/01/18 1428      PT SHORT TERM GOAL #1   Title  Patient will reduce timed up and go to <11 seconds to reduce fall risk and demonstrate improved transfer/gait ability.    Baseline  22.07 sec; 03/24/18: 14.2sec; 04/25/18: 14.0 sec with RW, 06/01/18: 12.7s    Time  2    Period  Weeks    Status  Partially Met    Target Date  06/29/18      PT SHORT TERM GOAL #2   Title  Patient will be independent with ascend/descend 2 steps using single UE in step over step pattern without LOB.    Baseline  03/24/18: CGA, single UE support, step-to pattern without LOB; 04/25/18: CGA, single UE support, step-over-step pattern without LOB; 06/01/18: CGA, single UE support    Time  2    Period  Weeks    Status  On-going    Target Date  06/29/17        PT Long Term Goals - 06/01/18 1428      PT LONG TERM GOAL #1   Title   Patient will be independent in home exercise program to improve strength/mobility for better functional independence with ADLs.    Baseline  04/25/18: unsure but does not remember doing HEP lately; 06/01/18: Pt performs intermittently    Time  8    Period  Weeks    Status  On-going    Target Date  07/27/18      PT LONG TERM GOAL #2   Title  Patient (> 86 years old) will complete five times sit to stand test in < 15 seconds indicating an increased LE strength and improved balance    Baseline  34.21 sec; 03/24/2018: 22.9 sec; 04/25/18: 18.5 sec with BUE support; 06/01/18: 15.5s with BUE support, still unable to perform STS without UE support    Time  8    Period  Weeks    Status  Partially Met    Target Date  07/27/18      PT LONG TERM GOAL #3   Title  Patient will increase six minute walk test distance to >1000 for progression to community ambulator and improve gait ability    Baseline  550 feet; 03/24/18: 795 ft.; 04/25/18: 975 ft with RW; 06/01/18: 1040' with RW    Time  8    Period  Weeks    Status  Achieved      PT LONG TERM GOAL #4   Title  Patient will  increase 10 meter walk test to >1.87ms as to improve gait speed for better community ambulation and to reduce fall risk    Baseline  .49 m/sec; 03/24/18: 1.01 m/sec; 06/01/18: self-selected: 9.7s = 1.03 m/s, fastest: 8.0s = 1.25 m/s    Time  --    Period  Weeks    Status  Achieved      PT LONG TERM GOAL #5   Title  Pt will improve BERG by at least 3 points in order to demonstrate clinically significant improvement in balance.      Baseline  06/01/18: 44/56    Time  8    Period  Weeks    Status  New    Target Date  07/27/18            Plan - 06/01/18 1427    Clinical Impression Statement  Today's session focused on performing outcome measures with patient and updating goals. She demonstrates continued improvement in 10 meter gait speed, Timed Up and Go, Five Time Sit to Stand, and Six Minute Walk Test distance. She is still  unable to perform sit to stand without UE assistance. She continues to utilize a rolling walker for ambulation and she would like to be able to ambulate without an assistive device. Performed BERG with patient and she scored 44/56 indicating that she is at high risk for falls. Pt encouraged to continued HEP and follow-up as scheduled. She will benefit from further skilled PT to continue to progress towards goals to maximize safety, mobility, and gait.     PT Frequency  2x / week    PT Duration  8 weeks    PT Treatment/Interventions  Patient/family education;Neuromuscular re-education;Balance training;Stair training;Therapeutic activities;Therapeutic exercise;Gait training;Aquatic Therapy    PT Next Visit Plan  balance and strengthening    Consulted and Agree with Plan of Care  Patient       Patient will benefit from skilled therapeutic intervention in order to improve the following deficits and impairments:  Abnormal gait, Decreased balance, Decreased endurance, Decreased mobility, Impaired flexibility, Decreased strength, Decreased knowledge of use of DME, Decreased activity tolerance, Difficulty walking  Visit Diagnosis: Muscle weakness (generalized) - Plan: PT plan of care cert/re-cert  Other lack of coordination - Plan: PT plan of care cert/re-cert  Difficulty in walking, not elsewhere classified - Plan: PT plan of care cert/re-cert     Problem List Patient Active Problem List   Diagnosis Date Noted  . Liver lesion 04/22/2018  . Goals of care, counseling/discussion 04/22/2018  . Cognitive deficit, post-stroke 12/31/2017  . Ataxia, post-stroke 12/31/2017  . Dysarthria, post-stroke   . Gait disturbance, post-stroke   . H/O cerebral parenchymal hemorrhage 11/04/2017  . Essential hypertension 11/04/2017  . Hyperlipidemia 11/04/2017  . Diabetes ([place] 11/04/2017  . CAD (coronary artery disease) 11/04/2017  . Aortic arch aneurysm ([place] 11/04/2017  . Hypokalemia 11/04/2017  .  Right-sided nontraumatic intracerebral hemorrhage of cerebellum ([place]   . History of cervical cancer   . History of TIA (transient ischemic attack)   . Acute systolic congestive heart failure ([place]   . Reactive hypertension   . Hypernatremia   . Leukocytosis   . Acute blood loss anemia   . Elevated serum creatinine   . Acute respiratory failure with hypoxia ([place]   . IVH (intraventricular hemorrhage) ([place] 10/29/2017  . Hypoxia 10/28/2017  . Pancreatic lesion 05/24/2017  . 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 Nodule of upper lobe of left lung 06/04/2015  . Malignant  carcinoid tumor of unknown primary site ([place]) 04/11/2015  . Cerebral thrombosis with cerebral infarction 04/03/2015  . Cancer of right colon ([place]) 03/28/2015  . CVA (cerebral infarction) 12/21/2014  . Polycythemia vera ([place]) 06/22/2006   Phillips Grout PT, DPT, GCS  Huprich,Jason 06/02/2018, 10:14 AM  [place] MAIN [facility] SERVICES [address] [place], [place], [address] Phone: [phone]   Fax:  [phone]  Name: HENCHY MCCAULEY MRN: [id number] Date of Birth: 21-Apr-1932

## 2018-06-07 ENCOUNTER — Ambulatory Visit: Payer: Medicare Other | Admitting: Occupational Therapy

## 2018-06-07 ENCOUNTER — Encounter: Payer: Self-pay | Admitting: Occupational Therapy

## 2018-06-07 ENCOUNTER — Ambulatory Visit: Payer: Medicare Other

## 2018-06-07 DIAGNOSIS — R278 Other lack of coordination: Secondary | ICD-10-CM

## 2018-06-07 DIAGNOSIS — M6281 Muscle weakness (generalized): Secondary | ICD-10-CM | POA: Diagnosis not present

## 2018-06-07 DIAGNOSIS — R262 Difficulty in walking, not elsewhere classified: Secondary | ICD-10-CM

## 2018-06-07 NOTE — Therapy (Signed)
[place] MAIN [facility] SERVICES [address] [place], [place], [address] Phone: [phone]   Fax:  [phone]  Occupational Therapy Treatment  Patient Details  Name: [name] MRN: [id number] Date of Birth: 11/09/1931 No data recorded  Encounter Date: 06/07/2018  OT End of Session - 06/07/18 1216    Visit Number  24    Number of Visits  34    Date for OT Re-Evaluation  06/20/18    Authorization Type  Medicare    Authorization Time Period  visit 3/10 for reporting period starting 05/26/2018    OT Start Time  1150    OT Stop Time  1230    OT Time Calculation (min)  40 min    Activity Tolerance  Patient tolerated treatment well    Behavior During Therapy  [facility] for tasks assessed/performed       Past Medical History:  Diagnosis Date  . Adenocarcinoma in situ of cervix   . Arthritis    hands  . Breast cancer ([place])   . Colon cancer ([place])   . Diabetes mellitus without complication ([place])   . Endometrial carcinoma (HCC)    s/p total abdominal hysterectomy  . H/O compression fracture of spine 2014   thoracic spine  . H/O polycythemia vera   . H/O TIA (transient ischemic attack) and stroke 09/2014, 03/2015   No deficits  . Hypertension   . Hyperuricemia   . Microalbuminuria   . Polycythemia vera ([place])   . Recurrent falls   . Skin cancer    face, legs  . Stroke [facility]) 2008   no deficits  . Trochanteric bursitis   . Varicose veins    treated    Past Surgical History:  Procedure Laterality Date  . ABDOMINAL HYSTERECTOMY    . BOWEL RESECTION N/A 03/28/2015   Procedure: SMALL BOWEL RESECTION;  Surgeon: Leonie Green, MD;  Location: [facility] ORS;  Service: General;  Laterality: N/A;  . CATARACT EXTRACTION W/ INTRAOCULAR LENS IMPLANT Right   . CATARACT EXTRACTION W/PHACO Left 10/07/2015   Procedure: CATARACT EXTRACTION PHACO AND INTRAOCULAR LENS PLACEMENT (IOC);  Surgeon: Ronnell Freshwater, MD;  Location: [place];  Service: Ophthalmology;  Laterality: Left;  DIABETIC - oral meds VISION BLUE  . CORONARY ANGIOGRAPHY N/A 10/29/2017   Procedure: CORONARY ANGIOGRAPHY;  Surgeon: Dionisio David, MD;  Location: [place] CV LAB;  Service: Cardiovascular;  Laterality: N/A;  . EXPLORATORY LAPAROTOMY     for fibroids  . LEFT HEART CATH Right 10/29/2017   Procedure: Left Heart Cath;  Surgeon: Dionisio David, MD;  Location: [place] CV LAB;  Service: Cardiovascular;  Laterality: Right;  . TONSILLECTOMY      There were no vitals filed for this visit.  Subjective Assessment - 06/07/18 1215    Subjective   Pt. reports that she needed help with sending her Christmas cards this year.    Pertinent History  84 year old female with adenocarcinoma of the cervix as well as endometrial carcinoma and polycythemia vera as well as diabetes who had hypertensive right cerebellar hemorrhage onset 10/28/2017.  She was treated initially at [facility] and then transferred to [facility].  Neurosurgical evaluation per Dr. Ronnald Ramp concluded no surgery was needed.  The patient completed inpatient rehabilitation at [facility] (including SLP) 11/04/2017-11/18/2107 and was discharged at a 24-hour supervision level.  The patient has hired caregiver to provide 24-hour supervision. Patient has received home health rehab services, including  SLP.     Patient Stated Goals  Patient would like to be independent in all tasks and be able to live alone again.    Currently in Pain?  No/denies       OT TREATMENT     Neuro muscular re-education:  Pt. performed [facility] tasks using the Grooved pegboard. Pt. worked on grasping the grooved pegs from a horizontal position, and moving the pegs to a vertical position in the hand to prepare for placing them in the grooved slot.   Selfcare:  Pt. worked on Media planner for printing  A simple recipe onto a recipe card. Pt. required verbal cues, and visual cues to complete. Pt.  required increased time, and minimal deviation from the line.                        OT Education - 06/07/18 1216    Education Details  Hndwriting    Person(s) Educated  Patient    Methods  Explanation;Demonstration;Verbal cues    Comprehension  Verbalized understanding;Returned demonstration;Verbal cues required          OT Long Term Goals - 05/24/18 1537      OT LONG TERM GOAL #1   Title  Patient will complete bathing with modified independence    Baseline  05/24/2018: Pt. requires assist with her back only.    Time  12    Period  Weeks    Status  On-going    Target Date  06/20/18      OT LONG TERM GOAL #2   Title  Patient will complete dressing skills with modified independence    Baseline  05/24/2018: Pt. requires minA.    Time  12    Period  Weeks    Status  On-going    Target Date  06/20/18      OT LONG TERM GOAL #3   Title  Patient will complete light meal prep with min assist    Baseline  [id number]: Conitnues to require caregiver assist for meal prep.    Time  12    Period  Weeks    Status  On-going    Target Date  06/20/18      OT LONG TERM GOAL #4   Title  Patient will increase R grip strength by 5# to open jars and containers with modified independence    Baseline  Pt. continues to have difficulty opening jars, and containers.    Time  12    Period  Weeks    Status  On-going    Target Date  06/20/18      OT LONG TERM GOAL #5   Title  Patient will improve strength by 1 mm grade RUE to assist with obtaining items from the closet.     Baseline  4/5 overall RUE. Pt. continues to have difficulty obtaining items from the closet.    Time  8    Period  Weeks    Status  On-going    Target Date  06/20/18      OT LONG TERM GOAL #6   Title  Pt will increase handwriting legibility and speed to 50% while writing a 3 sentence paragraph in order to write thank you notes and birthday cards for friends and family    Baseline  03/14/2018:  50%  legibility to writie a sentence.     Time  4    Period  Weeks    Status  On-going  Target Date  06/20/18            Plan - 06/07/18 1217    Clinical Impression Statement  Pt. is making steady progress, however continues to have difficulty with writing legibility, and speed. Pt. requires increased time, and cues for copying a simple recipe onto a recipe card with slight deviation from the line. Pt. continues to work on improving writing skills to be able to write checks, and holiday cards legibly, and improve overall writing during ADLs, and IADLs.    Occupational Profile and client history currently impacting functional performance  family more than 30 mins away, has 24 hour caregiver assist, decreased awareness, memory and safety    Occupational performance deficits (Please refer to evaluation for details):  ADL's;IADL's;Social Participation    Rehab Potential  Good    Current Impairments/barriers affecting progress:  memory, requires 24 hour caregivers,     OT Frequency  2x / week    OT Duration  12 weeks    OT Treatment/Interventions  Self-care/ADL training;Cryotherapy;Therapeutic exercise;DME and/or AE instruction;Functional Mobility Training;Cognitive remediation/compensation;Balance training;Neuromuscular education;Manual Therapy;Moist Heat;Therapeutic activities;Patient/family education    Clinical Decision Making  Several treatment options, min-mod task modification necessary    Consulted and Agree with Plan of Care  Patient    Family Member Consulted  Caregiver       Patient will benefit from skilled therapeutic intervention in order to improve the following deficits and impairments:  Decreased balance, Decreased mobility, Difficulty walking, Decreased cognition, Decreased activity tolerance, Decreased coordination, Decreased safety awareness, Decreased strength, Impaired UE functional use  Visit Diagnosis: Muscle weakness (generalized)  Other lack of  coordination    Problem List Patient Active Problem List   Diagnosis Date Noted  . Liver lesion 04/22/2018  . Goals of care, counseling/discussion 04/22/2018  . Cognitive deficit, post-stroke 12/31/2017  . Ataxia, post-stroke 12/31/2017  . Dysarthria, post-stroke   . Gait disturbance, post-stroke   . H/O cerebral parenchymal hemorrhage 11/04/2017  . Essential hypertension 11/04/2017  . Hyperlipidemia 11/04/2017  . Diabetes ([place]) 11/04/2017  . CAD (coronary artery disease) 11/04/2017  . 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 Hypokalemia 11/04/2017  . Right-sided nontraumatic intracerebral hemorrhage of cerebellum ([place])   . History of cervical cancer   . History of TIA (transient ischemic attack)   . Acute systolic congestive heart failure ([place])   . Reactive hypertension   . Hypernatremia   . Leukocytosis   . Acute blood loss anemia   . Elevated serum creatinine   . Acute respiratory failure with hypoxia ([place])   . IVH (intraventricular hemorrhage) ([place]) 10/29/2017  . Hypoxia 10/28/2017  . Pancreatic lesion 05/24/2017  . 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 Nodule of upper lobe of left lung 06/04/2015  . 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 Cerebral thrombosis with cerebral infarction 04/03/2015  . Cancer of right colon ([place]) 03/28/2015  . CVA (cerebral infarction) 12/21/2014  . Polycythemia vera ([place]) 06/22/2006    Harrel Carina, MS, OTR/L 06/07/2018, 12:29 PM  [place] MAIN [facility] SERVICES [address] [place], [place], [address] Phone: [phone]   Fax:  [phone]  Name: [name] MRN: [id number] Date of Birth: 17-Jun-1932

## 2018-06-07 NOTE — Therapy (Signed)
[place] MAIN [facility] SERVICES [address] [place], [place], [address] Phone: [phone]   Fax:  [phone]  Physical Therapy Treatment  Patient Details  Name: [name] MRN: [id number] Date of Birth: March 08, 1932 Referring Provider (PT): Charlett Blake   Encounter Date: 06/07/2018  PT End of Session - 06/07/18 1052    Visit Number  22    Number of Visits  25    Date for PT Re-Evaluation  07/27/18    Authorization Type  1/10, start of care 01/31/18, last goals 43/15/40 (recert)    PT Start Time  1059    PT Stop Time  1144    PT Time Calculation (min)  45 min    Equipment Utilized During Treatment  Gait belt    Activity Tolerance  Patient tolerated treatment well    Behavior During Therapy  WFL for tasks assessed/performed       Past Medical History:  Diagnosis Date  . Adenocarcinoma in situ of cervix   . Arthritis    hands  . Breast cancer ([place])   . Colon cancer ([place])   . Diabetes mellitus without complication ([place])   . Endometrial carcinoma (HCC)    s/p total abdominal hysterectomy  . H/O compression fracture of spine 2014   thoracic spine  . H/O polycythemia vera   . H/O TIA (transient ischemic attack) and stroke 09/2014, 03/2015   No deficits  . Hypertension   . Hyperuricemia   . Microalbuminuria   . Polycythemia vera ([place])   . Recurrent falls   . Skin cancer    face, legs  . Stroke [facility]) 2008   no deficits  . Trochanteric bursitis   . Varicose veins    treated    Past Surgical History:  Procedure Laterality Date  . ABDOMINAL HYSTERECTOMY    . BOWEL RESECTION N/A 03/28/2015   Procedure: SMALL BOWEL RESECTION;  Surgeon: Leonie Green, MD;  Location: [facility] ORS;  Service: General;  Laterality: N/A;  . CATARACT EXTRACTION W/ INTRAOCULAR LENS IMPLANT Right   . CATARACT EXTRACTION W/PHACO Left 10/07/2015   Procedure: CATARACT EXTRACTION PHACO AND INTRAOCULAR LENS PLACEMENT (IOC);  Surgeon: Ronnell Freshwater, MD;  Location: [place];  Service: Ophthalmology;  Laterality: Left;  DIABETIC - oral meds VISION BLUE  . CORONARY ANGIOGRAPHY N/A 10/29/2017   Procedure: CORONARY ANGIOGRAPHY;  Surgeon: Dionisio David, MD;  Location: [place] CV LAB;  Service: Cardiovascular;  Laterality: N/A;  . EXPLORATORY LAPAROTOMY     for fibroids  . LEFT HEART CATH Right 10/29/2017   Procedure: Left Heart Cath;  Surgeon: Dionisio David, MD;  Location: [place] CV LAB;  Service: Cardiovascular;  Laterality: Right;  . TONSILLECTOMY      There were no vitals filed for this visit.  Subjective Assessment - 06/07/18 1100    Subjective  Patient reports no falls or LOB since last session. No pain. Reports feeling good today, has been "trying" to do her exercises, sometimes her knees hurt.     Pertinent History    84 year old female with adenocarcinoma of the cervix as well as endometrial carcinoma and polycythemia vera as well as diabetes who had hypertensive right cerebellar hemorrhage onset 10/28/2017.  She was treated initially at [facility] and then transferred to [facility].  Neurosurgical evaluation per Dr. Ronnald Ramp concluded no surgery was needed.  The patient completed inpatient rehabilitation at [facility] (including SLP) 11/04/2017-11/18/2107 and was discharged  at a 24-hour supervision level.  The patient has hired caregiver to provide 24-hour supervision. Patient has received home health rehab services, including SLP.    Limitations  Standing;Walking    How long can you walk comfortably?  a few minutes    Patient Stated Goals  Patient wants to walk better and not need the RW.     Currently in Pain?  No/denies        TREATMENT:   Warm up on Nustep, BUE/BLE level 2 x6 min (unbilled);      Weaving around cones #5 x4laps with min A/CGA for safety; required min VCs to increase step length, upright posture and to increase turn for better cone negotiation   Gait outside  in hallway: Forward walking with head turns up/down, side/side x160 feet each. Pt needed verbal cues to continue to turn head during gait, as well as cues to decrease gait speed for safety. Occasionally stopped activity to allow pt to regain balance. MinAx1-CGA for safety.     Standing on 1/2 foam: (Flat side up) -heel/toe rocks with feet apart heel/toe rocks x15 with rail assist for safety  Ladder drills: Forward reciprocal gait x6 laps Side stepping x2 lap each direction Patient required CGA to close supervision with exercise; improved quality of movement (step length, balance) with repetition.   Sit to stand 10x from plinth table with decreasing UE support    Pick up 5 cones from 6" step (on side) turning 180 degrees to place onto chair, turn opp direction to then return cones to step. No LOB no UE support  Airex pad: balance beam: side step without UE support 6x length of bars, CGA  Airex pad: balance beam: tandem walk, CGA 6x length of bars; SUE support required  Airex pad: balloon taps reaching inside and outside BOS, no UE support. 2 minutes.                         PT Education - 06/07/18 1052    Education Details  exercise technique, stability    Person(s) Educated  Patient    Methods  Explanation;Demonstration;Tactile cues;Verbal cues    Comprehension  Verbalized understanding;Returned demonstration;Tactile cues required;Verbal cues required;Need further instruction       PT Short Term Goals - 06/01/18 1428      PT SHORT TERM GOAL #1   Title  Patient will reduce timed up and go to <11 seconds to reduce fall risk and demonstrate improved transfer/gait ability.    Baseline  22.07 sec; 03/24/18: 14.2sec; 04/25/18: 14.0 sec with RW, 06/01/18: 12.7s    Time  2    Period  Weeks    Status  Partially Met    Target Date  06/29/18      PT SHORT TERM GOAL #2   Title  Patient will be independent with ascend/descend 2 steps using single UE in step over step  pattern without LOB.    Baseline  03/24/18: CGA, single UE support, step-to pattern without LOB; 04/25/18: CGA, single UE support, step-over-step pattern without LOB; 06/01/18: CGA, single UE support    Time  2    Period  Weeks    Status  On-going    Target Date  06/29/17        PT Long Term Goals - 06/01/18 1428      PT LONG TERM GOAL #1   Title  Patient will be independent in home exercise program to improve strength/mobility for better functional  independence with ADLs.    Baseline  04/25/18: unsure but does not remember doing HEP lately; 06/01/18: Pt performs intermittently    Time  8    Period  Weeks    Status  On-going    Target Date  07/27/18      PT LONG TERM GOAL #2   Title  Patient (> 67 years old) will complete five times sit to stand test in < 15 seconds indicating an increased LE strength and improved balance    Baseline  34.21 sec; 03/24/2018: 22.9 sec; 04/25/18: 18.5 sec with BUE support; 06/01/18: 15.5s with BUE support, still unable to perform STS without UE support    Time  8    Period  Weeks    Status  Partially Met    Target Date  07/27/18      PT LONG TERM GOAL #3   Title  Patient will increase six minute walk test distance to >1000 for progression to community ambulator and improve gait ability    Baseline  550 feet; 03/24/18: 795 ft.; 04/25/18: 975 ft with RW; 06/01/18: 1040' with RW    Time  8    Period  Weeks    Status  Achieved      PT LONG TERM GOAL #4   Title  Patient will increase 10 meter walk test to >1.55ms as to improve gait speed for better community ambulation and to reduce fall risk    Baseline  .49 m/sec; 03/24/18: 1.01 m/sec; 06/01/18: self-selected: 9.7s = 1.03 m/s, fastest: 8.0s = 1.25 m/s    Time  --    Period  Weeks    Status  Achieved      PT LONG TERM GOAL #5   Title  Pt will improve BERG by at least 3 points in order to demonstrate clinically significant improvement in balance.      Baseline  06/01/18: 44/56    Time  8    Period   Weeks    Status  New    Target Date  07/27/18            Plan - 06/07/18 1147    Clinical Impression Statement  Patient performed sit to stands from raised plinth table without UE assistance for ten consecutive reps. Patient challenged clearing RLE when fatigued resulting in occasional drag and dysmetria with placement of R foot. The patient would benefit from further skilled PT to continue to progress towards goals to maximize safety, mobility, and gait.     PT Frequency  2x / week    PT Duration  8 weeks    PT Treatment/Interventions  Patient/family education;Neuromuscular re-education;Balance training;Stair training;Therapeutic activities;Therapeutic exercise;Gait training;Aquatic Therapy    PT Next Visit Plan  balance and strengthening    Consulted and Agree with Plan of Care  Patient       Patient will benefit from skilled therapeutic intervention in order to improve the following deficits and impairments:  Abnormal gait, Decreased balance, Decreased endurance, Decreased mobility, Impaired flexibility, Decreased strength, Decreased knowledge of use of DME, Decreased activity tolerance, Difficulty walking  Visit Diagnosis: Muscle weakness (generalized)  Other lack of coordination  Difficulty in walking, not elsewhere classified     Problem List Patient Active Problem List   Diagnosis Date Noted  . Liver lesion 04/22/2018  . Goals of care, counseling/discussion 04/22/2018  . Cognitive deficit, post-stroke 12/31/2017  . Ataxia, post-stroke 12/31/2017  . Dysarthria, post-stroke   . Gait disturbance, post-stroke   . H/O  cerebral parenchymal hemorrhage 11/04/2017  . Essential hypertension 11/04/2017  . Hyperlipidemia 11/04/2017  . Diabetes ([place]) 11/04/2017  . CAD (coronary artery disease) 11/04/2017  . 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 Hypokalemia 11/04/2017  . Right-sided nontraumatic intracerebral hemorrhage of cerebellum ([place])   . History of cervical cancer    . History of TIA (transient ischemic attack)   . Acute systolic congestive heart failure ([place])   . Reactive hypertension   . Hypernatremia   . Leukocytosis   . Acute blood loss anemia   . Elevated serum creatinine   . Acute respiratory failure with hypoxia ([place])   . IVH (intraventricular hemorrhage) ([place]) 10/29/2017  . Hypoxia 10/28/2017  . Pancreatic lesion 05/24/2017  . 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 Nodule of upper lobe of left lung 06/04/2015  . 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 Cerebral thrombosis with cerebral infarction 04/03/2015  . Cancer of right colon ([place]) 03/28/2015  . CVA (cerebral infarction) 12/21/2014  . Polycythemia vera ([place]) 06/22/2006   Janna Arch, PT, DPT   06/07/2018, 11:48 AM  [place] MAIN [facility] SERVICES [address] [place], [place], [address] Phone: [phone]   Fax:  [phone]  Name: [name] MRN: [id number] Date of Birth: 1932-02-17

## 2018-06-09 ENCOUNTER — Ambulatory Visit: Payer: Medicare Other | Admitting: Physical Therapy

## 2018-06-09 ENCOUNTER — Ambulatory Visit: Payer: Medicare Other | Admitting: Occupational Therapy

## 2018-06-09 ENCOUNTER — Encounter: Payer: Self-pay | Admitting: Physical Therapy

## 2018-06-09 DIAGNOSIS — R278 Other lack of coordination: Secondary | ICD-10-CM

## 2018-06-09 DIAGNOSIS — M6281 Muscle weakness (generalized): Secondary | ICD-10-CM | POA: Diagnosis not present

## 2018-06-09 DIAGNOSIS — R262 Difficulty in walking, not elsewhere classified: Secondary | ICD-10-CM

## 2018-06-09 NOTE — Therapy (Signed)
[place] MAIN [facility] SERVICES [address] [place], [place], [address] Phone: [phone]   Fax:  [phone]  Occupational Therapy Treatment  Patient Details  Name: [name] MRN: [id number] Date of Birth: [birth year] No data recorded  Encounter Date: 06/09/2018  OT End of Session - 06/09/18 1025    Visit Number  25    Number of Visits  60    Date for OT Re-Evaluation  06/20/18    Authorization Type  Medicare    Authorization Time Period  visit 4/10 for reporting period starting 05/26/2018    OT Start Time  1015    OT Stop Time  1100    OT Time Calculation (min)  45 min    Activity Tolerance  Patient tolerated treatment well    Behavior During Therapy  [facility] for tasks assessed/performed       Past Medical History:  Diagnosis Date  . Adenocarcinoma in situ of cervix   . Arthritis    hands  . Breast cancer ([place])   . Colon cancer ([place])   . Diabetes mellitus without complication ([place])   . Endometrial carcinoma (HCC)    s/p total abdominal hysterectomy  . H/O compression fracture of spine 2014   thoracic spine  . H/O polycythemia vera   . H/O TIA (transient ischemic attack) and stroke 09/2014, 03/2015   No deficits  . Hypertension   . Hyperuricemia   . Microalbuminuria   . Polycythemia vera ([place])   . Recurrent falls   . Skin cancer    face, legs  . Stroke [facility]) 2008   no deficits  . Trochanteric bursitis   . Varicose veins    treated    Past Surgical History:  Procedure Laterality Date  . ABDOMINAL HYSTERECTOMY    . BOWEL RESECTION N/A 03/28/2015   Procedure: SMALL BOWEL RESECTION;  Surgeon: Leonie Green, MD;  Location: [facility] ORS;  Service: General;  Laterality: N/A;  . CATARACT EXTRACTION W/ INTRAOCULAR LENS IMPLANT Right   . CATARACT EXTRACTION W/PHACO Left 10/07/2015   Procedure: CATARACT EXTRACTION PHACO AND INTRAOCULAR LENS PLACEMENT (IOC);  Surgeon: Ronnell Freshwater, MD;  Location: [place];  Service: Ophthalmology;  Laterality: Left;  DIABETIC - oral meds VISION BLUE  . CORONARY ANGIOGRAPHY N/A 10/29/2017   Procedure: CORONARY ANGIOGRAPHY;  Surgeon: Dionisio David, MD;  Location: [place] CV LAB;  Service: Cardiovascular;  Laterality: N/A;  . EXPLORATORY LAPAROTOMY     for fibroids  . LEFT HEART CATH Right 10/29/2017   Procedure: Left Heart Cath;  Surgeon: Dionisio David, MD;  Location: [place] CV LAB;  Service: Cardiovascular;  Laterality: Right;  . TONSILLECTOMY      There were no vitals filed for this visit.  Subjective Assessment - 06/09/18 1021    Subjective   Pt. reports that she needed help with sending her Christmas cards this year.    Pertinent History  84 year old female with adenocarcinoma of the cervix as well as endometrial carcinoma and polycythemia vera as well as diabetes who had hypertensive right cerebellar hemorrhage onset 10/28/2017.  She was treated initially at [facility] and then transferred to [facility].  Neurosurgical evaluation per Dr. Ronnald Ramp concluded no surgery was needed.  The patient completed inpatient rehabilitation at [facility] (including SLP) 11/04/2017-11/18/2107 and was discharged at a 24-hour supervision level.  The patient has hired caregiver to provide 24-hour supervision. Patient has received home health rehab services, including  SLP.     Patient Stated Goals  Patient would like to be independent in all tasks and be able to live alone again.    Currently in Pain?  No/denies      OT TREATMENT    Selfcare:  Pt. worked on Designer, multimedia, and printed writing forms. Pt. worked on Civil engineer, contracting of the months, and flowers with 50% legibility, and increased time. No deviation from the line. Pt. worked on Field seismologist with 100% legibility, no deviation from the line. Pt. presented with 50% legibility in handwritten form with increased time to complete. Minimal deviation from the  line. Pt. reports that she does not have her glasses with her. Pt. reports that her glasses don't always work for her.                            OT Education - 06/09/18 1024    Education Details  HAndwriting    Person(s) Educated  Patient    Methods  Explanation;Demonstration;Verbal cues    Comprehension  Verbalized understanding;Returned demonstration;Verbal cues required          OT Long Term Goals - 05/24/18 1537      OT LONG TERM GOAL #1   Title  Patient will complete bathing with modified independence    Baseline  05/24/2018: Pt. requires assist with her back only.    Time  12    Period  Weeks    Status  On-going    Target Date  06/20/18      OT LONG TERM GOAL #2   Title  Patient will complete dressing skills with modified independence    Baseline  05/24/2018: Pt. requires minA.    Time  12    Period  Weeks    Status  On-going    Target Date  06/20/18      OT LONG TERM GOAL #3   Title  Patient will complete light meal prep with min assist    Baseline  [id number]: Conitnues to require caregiver assist for meal prep.    Time  12    Period  Weeks    Status  On-going    Target Date  06/20/18      OT LONG TERM GOAL #4   Title  Patient will increase R grip strength by 5# to open jars and containers with modified independence    Baseline  Pt. continues to have difficulty opening jars, and containers.    Time  12    Period  Weeks    Status  On-going    Target Date  06/20/18      OT LONG TERM GOAL #5   Title  Patient will improve strength by 1 mm grade RUE to assist with obtaining items from the closet.     Baseline  4/5 overall RUE. Pt. continues to have difficulty obtaining items from the closet.    Time  8    Period  Weeks    Status  On-going    Target Date  06/20/18      OT LONG TERM GOAL #6   Title  Pt will increase handwriting legibility and speed to 50% while writing a 3 sentence paragraph in order to write thank you notes and  birthday cards for friends and family    Baseline  03/14/2018:  50% legibility to writie a sentence.     Time  4    Period  Weeks  Status  On-going    Target Date  06/20/18            Plan - 06/09/18 1025    Clinical Impression Statement  Pt. continues to make steady progress. Pt. continues to present with limited RUE strength, and [facility] skills limiting her ability to write legibly, and peform ADL, and IADL tasks.  Pt. continues to have difficulty with writing checks, and holiday cards, and would like to improve with these skills. Pt. continues to work on improving overall ADL, and IADL independence.    Occupational Profile and client history currently impacting functional performance  family more than 30 mins away, has 24 hour caregiver assist, decreased awareness, memory and safety    Occupational performance deficits (Please refer to evaluation for details):  ADL's;IADL's;Social Participation    Rehab Potential  Good    Current Impairments/barriers affecting progress:  memory, requires 24 hour caregivers,     OT Frequency  2x / week    OT Duration  12 weeks    OT Treatment/Interventions  Self-care/ADL training;Cryotherapy;Therapeutic exercise;DME and/or AE instruction;Functional Mobility Training;Cognitive remediation/compensation;Balance training;Neuromuscular education;Manual Therapy;Moist Heat;Therapeutic activities;Patient/family education    Clinical Decision Making  Several treatment options, min-mod task modification necessary    Consulted and Agree with Plan of Care  Patient    Family Member Consulted  Caregiver       Patient will benefit from skilled therapeutic intervention in order to improve the following deficits and impairments:  Decreased balance, Decreased mobility, Difficulty walking, Decreased cognition, Decreased activity tolerance, Decreased coordination, Decreased safety awareness, Decreased strength, Impaired UE functional use  Visit Diagnosis: Muscle weakness  (generalized)  Other lack of coordination    Problem List Patient Active Problem List   Diagnosis Date Noted  . Liver lesion 04/22/2018  . Goals of care, counseling/discussion 04/22/2018  . Cognitive deficit, post-stroke 12/31/2017  . Ataxia, post-stroke 12/31/2017  . Dysarthria, post-stroke   . Gait disturbance, post-stroke   . H/O cerebral parenchymal hemorrhage 11/04/2017  . Essential hypertension 11/04/2017  . Hyperlipidemia 11/04/2017  . Diabetes ([place]) 11/04/2017  . CAD (coronary artery disease) 11/04/2017  . 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 Hypokalemia 11/04/2017  . Right-sided nontraumatic intracerebral hemorrhage of cerebellum ([place])   . History of cervical cancer   . History of TIA (transient ischemic attack)   . Acute systolic congestive heart failure ([place])   . Reactive hypertension   . Hypernatremia   . Leukocytosis   . Acute blood loss anemia   . Elevated serum creatinine   . Acute respiratory failure with hypoxia ([place])   . IVH (intraventricular hemorrhage) ([place]) 10/29/2017  . Hypoxia 10/28/2017  . Pancreatic lesion 05/24/2017  . 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 Nodule of upper lobe of left lung 06/04/2015  . 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 Cerebral thrombosis with cerebral infarction 04/03/2015  . Cancer of right colon ([place]) 03/28/2015  . CVA (cerebral infarction) 12/21/2014  . Polycythemia vera ([place]) 06/22/2006    Harrel Carina, MS, OTR/L 06/09/2018, 10:34 AM  [place] MAIN [facility] SERVICES [address] [place], [place], [address] Phone: [phone]   Fax:  [phone]  Name: [name] MRN: [id number] Date of Birth: [birth year]

## 2018-06-09 NOTE — Therapy (Signed)
[place] MAIN [facility] SERVICES [address] [place], [place], [address] Phone: [phone]   Fax:  [phone]  Physical Therapy Treatment  Patient Details  Name: [name] MRN: [id number] Date of Birth: 1931/07/12 Referring Provider (PT): Charlett Blake   Encounter Date: 06/09/2018  PT End of Session - 06/09/18 1113    Visit Number  23    Number of Visits  41    Date for PT Re-Evaluation  07/27/18    Authorization Type  2/10, start of care 01/31/18, last goals 44/31/54 (recert)    PT Start Time  1102    PT Stop Time  1145    PT Time Calculation (min)  43 min    Equipment Utilized During Treatment  Gait belt    Activity Tolerance  Patient tolerated treatment well    Behavior During Therapy  WFL for tasks assessed/performed       Past Medical History:  Diagnosis Date  . Adenocarcinoma in situ of cervix   . Arthritis    hands  . Breast cancer ([place])   . Colon cancer ([place])   . Diabetes mellitus without complication ([place])   . Endometrial carcinoma (HCC)    s/p total abdominal hysterectomy  . H/O compression fracture of spine 2014   thoracic spine  . H/O polycythemia vera   . H/O TIA (transient ischemic attack) and stroke 09/2014, 03/2015   No deficits  . Hypertension   . Hyperuricemia   . Microalbuminuria   . Polycythemia vera ([place])   . Recurrent falls   . Skin cancer    face, legs  . Stroke [facility]) 2008   no deficits  . Trochanteric bursitis   . Varicose veins    treated    Past Surgical History:  Procedure Laterality Date  . ABDOMINAL HYSTERECTOMY    . BOWEL RESECTION N/A 03/28/2015   Procedure: SMALL BOWEL RESECTION;  Surgeon: Leonie Green, MD;  Location: [facility] ORS;  Service: General;  Laterality: N/A;  . CATARACT EXTRACTION W/ INTRAOCULAR LENS IMPLANT Right   . CATARACT EXTRACTION W/PHACO Left 10/07/2015   Procedure: CATARACT EXTRACTION PHACO AND INTRAOCULAR LENS PLACEMENT (IOC);  Surgeon: Ronnell Freshwater, MD;  Location: [place];  Service: Ophthalmology;  Laterality: Left;  DIABETIC - oral meds VISION BLUE  . CORONARY ANGIOGRAPHY N/A 10/29/2017   Procedure: CORONARY ANGIOGRAPHY;  Surgeon: Dionisio David, MD;  Location: [place] CV LAB;  Service: Cardiovascular;  Laterality: N/A;  . EXPLORATORY LAPAROTOMY     for fibroids  . LEFT HEART CATH Right 10/29/2017   Procedure: Left Heart Cath;  Surgeon: Dionisio David, MD;  Location: [place] CV LAB;  Service: Cardiovascular;  Laterality: Right;  . TONSILLECTOMY      There were no vitals filed for this visit.  Subjective Assessment - 06/09/18 1112    Subjective  Patient reports stumbling some when walking without her walker; She denies any new falls; She reports adherence to HEP with caregiver assistace;     Pertinent History    84 year old female with adenocarcinoma of the cervix as well as endometrial carcinoma and polycythemia vera as well as diabetes who had hypertensive right cerebellar hemorrhage onset 10/28/2017.  She was treated initially at [facility] and then transferred to [facility].  Neurosurgical evaluation per Dr. Ronnald Ramp concluded no surgery was needed.  The patient completed inpatient rehabilitation at [facility] (including SLP) 11/04/2017-11/18/2107 and was discharged at a 24-hour supervision  level.  The patient has hired caregiver to provide 24-hour supervision. Patient has received home health rehab services, including SLP.    Limitations  Standing;Walking    How long can you walk comfortably?  a few minutes    Patient Stated Goals  Patient wants to walk better and not need the RW.     Currently in Pain?  Yes    Pain Score  4     Pain Location  Knee    Pain Orientation  Right    Pain Descriptors / Indicators  Aching;Sore    Pain Type  Chronic pain    Pain Onset  More than a month ago    Pain Frequency  Intermittent    Aggravating Factors   getting up/down    Pain Relieving  Factors  rest    Effect of Pain on Daily Activities  decreased standing/walking tolerance    Multiple Pain Sites  No             Warm up on Nust[place], BUE/BLE level 2 x5 min (unbilled);  Exercise: Seated with red tband around BLE: -ankle DF x15 bilaterally with mod VCs for positioning to improve ankle strengthening;  -hip flexion march x15 bilaterally;   Seated: Cervical AROM stretch rotation x10 bilaterally, up/down x10 bilaterally; Patient required min-moderate verbal/tactile cues for correct exercise technique including to avoid trunk rotation to isolate cervical movement;   Patient instructed in advanced balance exercise  Gait outside in hallway: Forward walking with head turns up/down, side/side x160 feet each Forward/backward walking directional change x50 feet Patient initially exhibits unsteadiness with increased foot drag and impaired gait mechanics with slower gait speed; however with instruction to improve gait mechanics and then introduce head turns, patient able to exhibit better safety with dual task; She exhibits less instability with vertical head turns as compared to lateral;   Instructed patient in dynamic balance exercise: Resisted walking,7.5# forward/backward, side/side x4way, x2 laps each direction; required min A for safety and cues to improve weight shift especially with eccentric control for better balance control. Patient also required cues to increase step length, especially with forward/backward walking for reciprocal gait technique;                      PT Education - 06/09/18 1113    Education Details  exercise technique, balance/gait safety    Person(s) Educated  Patient    Methods  Explanation;Verbal cues    Comprehension  Verbalized understanding;Returned demonstration;Verbal cues required;Need further instruction       PT Short Term Goals - 06/01/18 1428      PT SHORT TERM GOAL #1   Title  Patient will  reduce timed up and go to <11 seconds to reduce fall risk and demonstrate improved transfer/gait ability.    Baseline  22.07 sec; 03/24/18: 14.2sec; 04/25/18: 14.0 sec with RW, 06/01/18: 12.7s    Time  2    Period  Weeks    Status  Partially Met    Target Date  06/29/18      PT SHORT TERM GOAL #2   Title  Patient will be independent with ascend/descend 2 steps using single UE in step over step pattern without LOB.    Baseline  03/24/18: CGA, single UE support, step-to pattern without LOB; 04/25/18: CGA, single UE support, step-over-step pattern without LOB; 06/01/18: CGA, single UE support    Time  2    Period  Weeks    Status  On-going  Target Date  06/29/17        PT Long Term Goals - 06/01/18 1428      PT LONG TERM GOAL #1   Title  Patient will be independent in home exercise program to improve strength/mobility for better functional independence with ADLs.    Baseline  04/25/18: unsure but does not remember doing HEP lately; 06/01/18: Pt performs intermittently    Time  8    Period  Weeks    Status  On-going    Target Date  07/27/18      PT LONG TERM GOAL #2   Title  Patient (> 66 years old) will complete five times sit to stand test in < 15 seconds indicating an increased LE strength and improved balance    Baseline  34.21 sec; 03/24/2018: 22.9 sec; 04/25/18: 18.5 sec with BUE support; 06/01/18: 15.5s with BUE support, still unable to perform STS without UE support    Time  8    Period  Weeks    Status  Partially Met    Target Date  07/27/18      PT LONG TERM GOAL #3   Title  Patient will increase six minute walk test distance to >1000 for progression to community ambulator and improve gait ability    Baseline  550 feet; 03/24/18: 795 ft.; 04/25/18: 975 ft with RW; 06/01/18: 1040' with RW    Time  8    Period  Weeks    Status  Achieved      PT LONG TERM GOAL #4   Title  Patient will increase 10 meter walk test to >1.77ms as to improve gait speed for better community  ambulation and to reduce fall risk    Baseline  .49 m/sec; 03/24/18: 1.01 m/sec; 06/01/18: self-selected: 9.7s = 1.03 m/s, fastest: 8.0s = 1.25 m/s    Time  --    Period  Weeks    Status  Achieved      PT LONG TERM GOAL #5   Title  Pt will improve BERG by at least 3 points in order to demonstrate clinically significant improvement in balance.      Baseline  06/01/18: 44/56    Time  8    Period  Weeks    Status  New    Target Date  07/27/18            Plan - 06/09/18 1127    Clinical Impression Statement  Patient is responding well to treatment. She is exhibiting improved balance requiring less assistance with dynamic balance tasks. She does continues to have some unsteadiness when walking unsupported with head turns. She requires cues for gaze stabilization to improve gait safety; Instructed patient in cervical AROM to improve flexibility for less stiffness when turning head. Also instructed patient in resisted LE strengthening to improve foot clearance during ambulation for less foot drag. She would benefit from additional skilled PT Intervention to improve strength, balance and gait safety;     PT Frequency  2x / week    PT Duration  8 weeks    PT Treatment/Interventions  Patient/family education;Neuromuscular re-education;Balance training;Stair training;Therapeutic activities;Therapeutic exercise;Gait training;Aquatic Therapy    PT Next Visit Plan  balance and strengthening    Consulted and Agree with Plan of Care  Patient       Patient will benefit from skilled therapeutic intervention in order to improve the following deficits and impairments:  Abnormal gait, Decreased balance, Decreased endurance, Decreased mobility, Impaired flexibility, Decreased strength, Decreased  knowledge of use of DME, Decreased activity tolerance, Difficulty walking  Visit Diagnosis: Muscle weakness (generalized)  Other lack of coordination  Difficulty in walking, not elsewhere  classified     Problem List Patient Active Problem List   Diagnosis Date Noted  . Liver lesion 04/22/2018  . Goals of care, counseling/discussion 04/22/2018  . Cognitive deficit, post-stroke 12/31/2017  . Ataxia, post-stroke 12/31/2017  . Dysarthria, post-stroke   . Gait disturbance, post-stroke   . H/O cerebral parenchymal hemorrhage 11/04/2017  . Essential hypertension 11/04/2017  . Hyperlipidemia 11/04/2017  . Diabetes ([place]) 11/04/2017  . CAD (coronary artery disease) 11/04/2017  . 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 Hypokalemia 11/04/2017  . Right-sided nontraumatic intracerebral hemorrhage of cerebellum ([place])   . History of cervical cancer   . History of TIA (transient ischemic attack)   . Acute systolic congestive heart failure ([place])   . Reactive hypertension   . Hypernatremia   . Leukocytosis   . Acute blood loss anemia   . Elevated serum creatinine   . Acute respiratory failure with hypoxia ([place])   . IVH (intraventricular hemorrhage) ([place]) 10/29/2017  . Hypoxia 10/28/2017  . Pancreatic lesion 05/24/2017  . 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 Nodule of upper lobe of left lung 06/04/2015  . 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 Cerebral thrombosis with cerebral infarction 04/03/2015  . Cancer of right colon ([place]) 03/28/2015  . CVA (cerebral infarction) 12/21/2014  . Polycythemia vera ([place]) 06/22/2006    Trotter,Margaret PT, DPT 06/09/2018, 1:42 PM  [place] MAIN [facility] SERVICES [address] [place], [place], [address] Phone: [phone]   Fax:  [phone]  Name: [name] MRN: [id number] Date of Birth: [birth year]

## 2018-06-16 ENCOUNTER — Ambulatory Visit: Payer: Medicare Other | Admitting: Occupational Therapy

## 2018-06-16 ENCOUNTER — Ambulatory Visit: Payer: Medicare Other | Admitting: Physical Therapy

## 2018-06-20 ENCOUNTER — Encounter: Payer: Self-pay | Admitting: Physical Therapy

## 2018-06-20 ENCOUNTER — Ambulatory Visit: Payer: Medicare Other | Admitting: Physical Therapy

## 2018-06-20 ENCOUNTER — Ambulatory Visit: Payer: Medicare Other | Admitting: Occupational Therapy

## 2018-06-20 ENCOUNTER — Encounter: Payer: Self-pay | Admitting: Occupational Therapy

## 2018-06-20 DIAGNOSIS — M6281 Muscle weakness (generalized): Secondary | ICD-10-CM | POA: Diagnosis not present

## 2018-06-20 DIAGNOSIS — R278 Other lack of coordination: Secondary | ICD-10-CM

## 2018-06-20 DIAGNOSIS — R262 Difficulty in walking, not elsewhere classified: Secondary | ICD-10-CM

## 2018-06-20 NOTE — Therapy (Signed)
[place] MAIN [facility] SERVICES [address] [place], [place], [address] Phone: [phone]   Fax:  [phone]  Occupational Therapy Treatment  Patient Details  Name: [name] MRN: [id number] Date of Birth: 1931/07/20 No data recorded  Encounter Date: 06/20/2018  OT End of Session - 06/20/18 1442    Visit Number  26    Number of Visits  52    Date for OT Re-Evaluation  06/20/18    Authorization Type  Medicare    Authorization Time Period  visit 5/10 for reporting period starting 05/26/2018    OT Start Time  1434    OT Stop Time  1515    OT Time Calculation (min)  41 min    Activity Tolerance  Patient tolerated treatment well    Behavior During Therapy  [facility] for tasks assessed/performed       Past Medical History:  Diagnosis Date  . Adenocarcinoma in situ of cervix   . Arthritis    hands  . Breast cancer ([place])   . Colon cancer ([place])   . Diabetes mellitus without complication ([place])   . Endometrial carcinoma (HCC)    s/p total abdominal hysterectomy  . H/O compression fracture of spine 2014   thoracic spine  . H/O polycythemia vera   . H/O TIA (transient ischemic attack) and stroke 09/2014, 03/2015   No deficits  . Hypertension   . Hyperuricemia   . Microalbuminuria   . Polycythemia vera ([place])   . Recurrent falls   . Skin cancer    face, legs  . Stroke [facility]) 2008   no deficits  . Trochanteric bursitis   . Varicose veins    treated    Past Surgical History:  Procedure Laterality Date  . ABDOMINAL HYSTERECTOMY    . BOWEL RESECTION N/A 03/28/2015   Procedure: SMALL BOWEL RESECTION;  Surgeon: Leonie Green, MD;  Location: [facility] ORS;  Service: General;  Laterality: N/A;  . CATARACT EXTRACTION W/ INTRAOCULAR LENS IMPLANT Right   . CATARACT EXTRACTION W/PHACO Left 10/07/2015   Procedure: CATARACT EXTRACTION PHACO AND INTRAOCULAR LENS PLACEMENT (IOC);  Surgeon: Ronnell Freshwater, MD;  Location: [place];  Service: Ophthalmology;  Laterality: Left;  DIABETIC - oral meds VISION BLUE  . CORONARY ANGIOGRAPHY N/A 10/29/2017   Procedure: CORONARY ANGIOGRAPHY;  Surgeon: Dionisio David, MD;  Location: [place] CV LAB;  Service: Cardiovascular;  Laterality: N/A;  . EXPLORATORY LAPAROTOMY     for fibroids  . LEFT HEART CATH Right 10/29/2017   Procedure: Left Heart Cath;  Surgeon: Dionisio David, MD;  Location: [place] CV LAB;  Service: Cardiovascular;  Laterality: Right;  . TONSILLECTOMY      There were no vitals filed for this visit.  Subjective Assessment - 06/20/18 1440    Subjective   Pt. continues to work on improving UE strength, and [facility] skills.    Pertinent History  84 year old female with adenocarcinoma of the cervix as well as endometrial carcinoma and polycythemia vera as well as diabetes who had hypertensive right cerebellar hemorrhage onset 10/28/2017.  She was treated initially at [facility] and then transferred to [facility].  Neurosurgical evaluation per Dr. Ronnald Ramp concluded no surgery was needed.  The patient completed inpatient rehabilitation at [facility] (including SLP) 11/04/2017-11/18/2107 and was discharged at a 24-hour supervision level.  The patient has hired caregiver to provide 24-hour supervision. Patient has received home health rehab services, including SLP.  Patient Stated Goals  Patient would like to be independent in all tasks and be able to live alone again.    Currently in Pain?  No/denies      OT TREATMENT    Selfcare:  Pt. worked on Building surveyor, and Media planner. Pt. worked on writing 2 sentence responses each for situational safety awareness, and judgement.  Pt. with 25% legibility, decreased speed, intact spacing, and no deviation from the line. Pt. worked on Occupational psychologist with 50% legibility. Increased time was required.                         OT Education - 06/20/18 1441    Education  Details  Handwriting    Person(s) Educated  Patient    Methods  Explanation;Demonstration;Verbal cues    Comprehension  Verbalized understanding;Returned demonstration;Verbal cues required          OT Long Term Goals - 05/24/18 1537      OT LONG TERM GOAL #1   Title  Patient will complete bathing with modified independence    Baseline  05/24/2018: Pt. requires assist with her back only.    Time  12    Period  Weeks    Status  On-going    Target Date  06/20/18      OT LONG TERM GOAL #2   Title  Patient will complete dressing skills with modified independence    Baseline  05/24/2018: Pt. requires minA.    Time  12    Period  Weeks    Status  On-going    Target Date  06/20/18      OT LONG TERM GOAL #3   Title  Patient will complete light meal prep with min assist    Baseline  [id number]: Conitnues to require caregiver assist for meal prep.    Time  12    Period  Weeks    Status  On-going    Target Date  06/20/18      OT LONG TERM GOAL #4   Title  Patient will increase R grip strength by 5# to open jars and containers with modified independence    Baseline  Pt. continues to have difficulty opening jars, and containers.    Time  12    Period  Weeks    Status  On-going    Target Date  06/20/18      OT LONG TERM GOAL #5   Title  Patient will improve strength by 1 mm grade RUE to assist with obtaining items from the closet.     Baseline  4/5 overall RUE. Pt. continues to have difficulty obtaining items from the closet.    Time  8    Period  Weeks    Status  On-going    Target Date  06/20/18      OT LONG TERM GOAL #6   Title  Pt will increase handwriting legibility and speed to 50% while writing a 3 sentence paragraph in order to write thank you notes and birthday cards for friends and family    Baseline  03/14/2018:  50% legibility to writie a sentence.     Time  4    Period  Weeks    Status  On-going    Target Date  06/20/18            Plan - 06/20/18 1444     Clinical Impression Statement  Pt. is making steady progress overall. Pt.  presents with limited [facility] skills, limiting her ability to write legibly for checkwriting, and filling out holiday cards. Pt. requires increased time during writing tasks with 50% legibility. Pt. continues to work on improving UE functioning, [place], and writing for ADLs, and IADLs.     Occupational Profile and client history currently impacting functional performance  family more than 30 mins away, has 24 hour caregiver assist, decreased awareness, memory and safety    Occupational performance deficits (Please refer to evaluation for details):  ADL's;IADL's;Social Participation    Rehab Potential  Good    Current Impairments/barriers affecting progress:  memory, requires 24 hour caregivers,     OT Frequency  2x / week    OT Duration  12 weeks    OT Treatment/Interventions  Self-care/ADL training;Cryotherapy;Therapeutic exercise;DME and/or AE instruction;Functional Mobility Training;Cognitive remediation/compensation;Balance training;Neuromuscular education;Manual Therapy;Moist Heat;Therapeutic activities;Patient/family education    Clinical Decision Making  Several treatment options, min-mod task modification necessary    Consulted and Agree with Plan of Care  Patient    Family Member Consulted  Caregiver       Patient will benefit from skilled therapeutic intervention in order to improve the following deficits and impairments:  Decreased balance, Decreased mobility, Difficulty walking, Decreased cognition, Decreased activity tolerance, Decreased coordination, Decreased safety awareness, Decreased strength, Impaired UE functional use  Visit Diagnosis: Other lack of coordination    Problem List Patient Active Problem List   Diagnosis Date Noted  . Liver lesion 04/22/2018  . Goals of care, counseling/discussion 04/22/2018  . Cognitive deficit, post-stroke 12/31/2017  . Ataxia, post-stroke 12/31/2017  . Dysarthria,  post-stroke   . Gait disturbance, post-stroke   . H/O cerebral parenchymal hemorrhage 11/04/2017  . Essential hypertension 11/04/2017  . Hyperlipidemia 11/04/2017  . Diabetes ([place]) 11/04/2017  . CAD (coronary artery disease) 11/04/2017  . 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 Hypokalemia 11/04/2017  . Right-sided nontraumatic intracerebral hemorrhage of cerebellum ([place])   . History of cervical cancer   . History of TIA (transient ischemic attack)   . Acute systolic congestive heart failure ([place])   . Reactive hypertension   . Hypernatremia   . Leukocytosis   . Acute blood loss anemia   . Elevated serum creatinine   . Acute respiratory failure with hypoxia ([place])   . IVH (intraventricular hemorrhage) ([place]) 10/29/2017  . Hypoxia 10/28/2017  . Pancreatic lesion 05/24/2017  . 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 Nodule of upper lobe of left lung 06/04/2015  . 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 Cerebral thrombosis with cerebral infarction 04/03/2015  . Cancer of right colon ([place]) 03/28/2015  . CVA (cerebral infarction) 12/21/2014  . Polycythemia vera ([place]) 06/22/2006    Harrel Carina, MS, OTR/L 06/20/2018, 3:01 PM  [place] MAIN [facility] SERVICES [address] [place], [place], [address] Phone: [phone]   Fax:  [phone]  Name: [name] MRN: [id number] Date of Birth: 1931-07-21

## 2018-06-20 NOTE — Therapy (Signed)
[place] MAIN [facility] SERVICES [address] [place], [place], [address] Phone: [phone]   Fax:  [phone]  Physical Therapy Treatment  Patient Details  Name: [name] MRN: [id number] Date of Birth: [birth year] Referring Provider (PT): Charlett Blake   Encounter Date: 06/20/2018  PT End of Session - 06/20/18 1527    Visit Number  24    Number of Visits  41    Date for PT Re-Evaluation  07/27/18    Authorization Type  3/10, start of care 01/31/18, last goals 69/62/95 (recert)    PT Start Time  1517    PT Stop Time  1602    PT Time Calculation (min)  45 min    Equipment Utilized During Treatment  Gait belt    Activity Tolerance  Patient tolerated treatment well    Behavior During Therapy  WFL for tasks assessed/performed       Past Medical History:  Diagnosis Date  . Adenocarcinoma in situ of cervix   . Arthritis    hands  . Breast cancer ([place])   . Colon cancer ([place])   . Diabetes mellitus without complication ([place])   . Endometrial carcinoma (HCC)    s/p total abdominal hysterectomy  . H/O compression fracture of spine 2014   thoracic spine  . H/O polycythemia vera   . H/O TIA (transient ischemic attack) and stroke 09/2014, 03/2015   No deficits  . Hypertension   . Hyperuricemia   . Microalbuminuria   . Polycythemia vera ([place])   . Recurrent falls   . Skin cancer    face, legs  . Stroke [facility]) 2008   no deficits  . Trochanteric bursitis   . Varicose veins    treated    Past Surgical History:  Procedure Laterality Date  . ABDOMINAL HYSTERECTOMY    . BOWEL RESECTION N/A 03/28/2015   Procedure: SMALL BOWEL RESECTION;  Surgeon: Leonie Green, MD;  Location: [facility] ORS;  Service: General;  Laterality: N/A;  . CATARACT EXTRACTION W/ INTRAOCULAR LENS IMPLANT Right   . CATARACT EXTRACTION W/PHACO Left 10/07/2015   Procedure: CATARACT EXTRACTION PHACO AND INTRAOCULAR LENS PLACEMENT (IOC);  Surgeon: Ronnell Freshwater, MD;  Location: [place];  Service: Ophthalmology;  Laterality: Left;  DIABETIC - oral meds VISION BLUE  . CORONARY ANGIOGRAPHY N/A 10/29/2017   Procedure: CORONARY ANGIOGRAPHY;  Surgeon: Dionisio David, MD;  Location: [place] CV LAB;  Service: Cardiovascular;  Laterality: N/A;  . EXPLORATORY LAPAROTOMY     for fibroids  . LEFT HEART CATH Right 10/29/2017   Procedure: Left Heart Cath;  Surgeon: Dionisio David, MD;  Location: [place] CV LAB;  Service: Cardiovascular;  Laterality: Right;  . TONSILLECTOMY      There were no vitals filed for this visit.  Subjective Assessment - 06/20/18 1524    Subjective  Patient denies any falls since her last visit. She then states that she almost had a fall and fell into a chair when her R knee "gave way." Patient states that she knows she needs her walker. Patient denies any pain in her R knee today because she took some Tylenol.     Pertinent History    84 year old female with adenocarcinoma of the cervix as well as endometrial carcinoma and polycythemia vera as well as diabetes who had hypertensive right cerebellar hemorrhage onset 10/28/2017.  She was treated initially at [facility] and then transferred to [facility].  Neurosurgical evaluation per Dr. Ronnald Ramp concluded no surgery was needed.  The patient completed inpatient rehabilitation at [facility] (including SLP) 11/04/2017-11/18/2107 and was discharged at a 24-hour supervision level.  The patient has hired caregiver to provide 24-hour supervision. Patient has received home health rehab services, including SLP.    Limitations  Standing;Walking    How long can you walk comfortably?  a few minutes    Patient Stated Goals  Patient wants to walk better and not need the RW.     Currently in Pain?  No/denies    Pain Onset  More than a month ago       Warm up on N[place], BUE/BLE level 2 x5 min (during hx, 3 min billed);  Exercise: Seated with red tband  around BLE: -ankle DF x20 bilaterally with mod VCs for positioning to improve ankle strengthening;  -hip flexion march x15 bilaterally; -clamshell x15  Patient instructed in advanced balance exercise  Gait outside in hallway: Forward walking with head turns up/down, side/side x143fet each Forward/backward walking directional change x50 feet  Patient self-selects a speed of gait which is faster than she can control posturally; she requires verbal cues to prevent impulsive movement. Patient had 2 LOB immediately after turning, but was able to recover balance with PT support.  Instructed patient in dynamic balance exercise: Resisted walking,7.5# forward/backward, side/side, x2 laps each direction; required min A for safety and cues to improve weight shift especially with eccentric control for better balance control.Patient also required cues to increase step length, especially with forward/backward walking to promote step-through gait;   (CGA, 1 LOB when engaged in dual task)   PT Education - 06/20/18 1527    Education Details  exercise technique, balance/gait safety     Person(s) Educated  Patient    Methods  Explanation;Demonstration;Verbal cues    Comprehension  Verbalized understanding;Need further instruction;Returned demonstration       PT Short Term Goals - 06/01/18 1428      PT SHORT TERM GOAL #1   Title  Patient will reduce timed up and go to <11 seconds to reduce fall risk and demonstrate improved transfer/gait ability.    Baseline  22.07 sec; 03/24/18: 14.2sec; 04/25/18: 14.0 sec with RW, 06/01/18: 12.7s    Time  2    Period  Weeks    Status  Partially Met    Target Date  06/29/18      PT SHORT TERM GOAL #2   Title  Patient will be independent with ascend/descend 2 steps using single UE in step over step pattern without LOB.    Baseline  03/24/18: CGA, single UE support, step-to pattern without LOB; 04/25/18: CGA, single UE support, step-over-step pattern  without LOB; 06/01/18: CGA, single UE support    Time  2    Period  Weeks    Status  On-going    Target Date  06/29/17        PT Long Term Goals - 06/01/18 1428      PT LONG TERM GOAL #1   Title  Patient will be independent in home exercise program to improve strength/mobility for better functional independence with ADLs.    Baseline  04/25/18: unsure but does not remember doing HEP lately; 06/01/18: Pt performs intermittently    Time  8    Period  Weeks    Status  On-going    Target Date  07/27/18      PT LONG TERM GOAL #2   Title  Patient (>  64 years old) will complete five times sit to stand test in < 15 seconds indicating an increased LE strength and improved balance    Baseline  34.21 sec; 03/24/2018: 22.9 sec; 04/25/18: 18.5 sec with BUE support; 06/01/18: 15.5s with BUE support, still unable to perform STS without UE support    Time  8    Period  Weeks    Status  Partially Met    Target Date  07/27/18      PT LONG TERM GOAL #3   Title  Patient will increase six minute walk test distance to >1000 for progression to community ambulator and improve gait ability    Baseline  550 feet; 03/24/18: 795 ft.; 04/25/18: 975 ft with RW; 06/01/18: 1040' with RW    Time  8    Period  Weeks    Status  Achieved      PT LONG TERM GOAL #4   Title  Patient will increase 10 meter walk test to >1.10ms as to improve gait speed for better community ambulation and to reduce fall risk    Baseline  .49 m/sec; 03/24/18: 1.01 m/sec; 06/01/18: self-selected: 9.7s = 1.03 m/s, fastest: 8.0s = 1.25 m/s    Time  --    Period  Weeks    Status  Achieved      PT LONG TERM GOAL #5   Title  Pt will improve BERG by at least 3 points in order to demonstrate clinically significant improvement in balance.      Baseline  06/01/18: 44/56    Time  8    Period  Weeks    Status  New    Target Date  07/27/18            Plan - 06/21/18 1125    Clinical Impression Statement  Patient presents to clinic with  excellent motivation for therapy. Patient continues to demonstrate unsteadiness when walking without her RW with a self-selected increased speed and shortened stride length. Patient is able to correct with moderate verbal cues, but requires consistent cueing after turns to ensure safety. Patient will continue to benefit from skilled therapeutic intervention to address deficits in strength, balance, and mobility for increased safety and improved QOL.    PT Frequency  2x / week    PT Duration  8 weeks    PT Treatment/Interventions  Patient/family education;Neuromuscular re-education;Balance training;Stair training;Therapeutic activities;Therapeutic exercise;Gait training;Aquatic Therapy    PT Next Visit Plan  balance and strengthening    Consulted and Agree with Plan of Care  Patient       Patient will benefit from skilled therapeutic intervention in order to improve the following deficits and impairments:  Abnormal gait, Decreased balance, Decreased endurance, Decreased mobility, Impaired flexibility, Decreased strength, Decreased knowledge of use of DME, Decreased activity tolerance, Difficulty walking  Visit Diagnosis: Other lack of coordination  Muscle weakness (generalized)  Difficulty in walking, not elsewhere classified     Problem List Patient Active Problem List   Diagnosis Date Noted  . Liver lesion 04/22/2018  . Goals of care, counseling/discussion 04/22/2018  . Cognitive deficit, post-stroke 12/31/2017  . Ataxia, post-stroke 12/31/2017  . Dysarthria, post-stroke   . Gait disturbance, post-stroke   . H/O cerebral parenchymal hemorrhage 11/04/2017  . Essential hypertension 11/04/2017  . Hyperlipidemia 11/04/2017  . Diabetes ([place] 11/04/2017  . CAD (coronary artery disease) 11/04/2017  . Aortic arch aneurysm ([place] 11/04/2017  . Hypokalemia 11/04/2017  . Right-sided nontraumatic intracerebral hemorrhage of cerebellum ([place]   .  History of cervical cancer   . History of TIA  (transient ischemic attack)   . Acute systolic congestive heart failure ([place])   . Reactive hypertension   . Hypernatremia   . Leukocytosis   . Acute blood loss anemia   . Elevated serum creatinine   . Acute respiratory failure with hypoxia ([place])   . IVH (intraventricular hemorrhage) ([place]) 10/29/2017  . Hypoxia 10/28/2017  . Pancreatic lesion 05/24/2017  . 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 Nodule of upper lobe of left lung 06/04/2015  . 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 Cerebral thrombosis with cerebral infarction 04/03/2015  . Cancer of right colon ([place]) 03/28/2015  . CVA (cerebral infarction) 12/21/2014  . Polycythemia vera ([place]) 06/22/2006   Myles Gip PT, DPT [phone] 06/21/2018, 11:46 AM  [place] MAIN [facility] SERVICES [address] [place], [place], [address] Phone: [phone]   Fax:  [phone]  Name: [name] MRN: [id number] Date of Birth: [birth year]

## 2018-06-23 ENCOUNTER — Encounter: Payer: Self-pay | Admitting: Occupational Therapy

## 2018-06-23 ENCOUNTER — Encounter: Payer: Self-pay | Admitting: Physical Therapy

## 2018-06-23 ENCOUNTER — Ambulatory Visit: Payer: Medicare Other | Admitting: Physical Therapy

## 2018-06-23 ENCOUNTER — Ambulatory Visit: Payer: Medicare Other | Attending: Physical Medicine & Rehabilitation | Admitting: Occupational Therapy

## 2018-06-23 DIAGNOSIS — R278 Other lack of coordination: Secondary | ICD-10-CM | POA: Insufficient documentation

## 2018-06-23 DIAGNOSIS — R262 Difficulty in walking, not elsewhere classified: Secondary | ICD-10-CM | POA: Diagnosis present

## 2018-06-23 DIAGNOSIS — M6281 Muscle weakness (generalized): Secondary | ICD-10-CM

## 2018-06-23 NOTE — Patient Instructions (Signed)
Neck Rotation   reps: 10 sets: 3   daily: 1 weekly: 7  Seated Cervical Retraction and Extension   reps: 10 sets: 3   daily: 1 weekly: 7

## 2018-06-23 NOTE — Therapy (Signed)
[place] MAIN [facility] SERVICES [address] [place], [place], [address] Phone: [phone]   Fax:  [phone]  Physical Therapy Treatment  Patient Details  Name: [name] MRN: [id number] Date of Birth: 03-Dec-1931 Referring Provider (PT): Charlett Blake   Encounter Date: 06/23/2018  PT End of Session - 06/23/18 1159    Visit Number  25    Number of Visits  41    Date for PT Re-Evaluation  07/27/18    Authorization Type  5/10, start of care 01/31/18, last goals 88/89/16 (recert)    PT Start Time  1155    PT Stop Time  1230    PT Time Calculation (min)  35 min    Equipment Utilized During Treatment  Gait belt    Activity Tolerance  Patient tolerated treatment well    Behavior During Therapy  WFL for tasks assessed/performed       Past Medical History:  Diagnosis Date  . Adenocarcinoma in situ of cervix   . Arthritis    hands  . Breast cancer ([place])   . Colon cancer ([place])   . Diabetes mellitus without complication ([place])   . Endometrial carcinoma (HCC)    s/p total abdominal hysterectomy  . H/O compression fracture of spine 2014   thoracic spine  . H/O polycythemia vera   . H/O TIA (transient ischemic attack) and stroke 09/2014, 03/2015   No deficits  . Hypertension   . Hyperuricemia   . Microalbuminuria   . Polycythemia vera ([place])   . Recurrent falls   . Skin cancer    face, legs  . Stroke [facility]) 2008   no deficits  . Trochanteric bursitis   . Varicose veins    treated    Past Surgical History:  Procedure Laterality Date  . ABDOMINAL HYSTERECTOMY    . BOWEL RESECTION N/A 03/28/2015   Procedure: SMALL BOWEL RESECTION;  Surgeon: Leonie Green, MD;  Location: [facility] ORS;  Service: General;  Laterality: N/A;  . CATARACT EXTRACTION W/ INTRAOCULAR LENS IMPLANT Right   . CATARACT EXTRACTION W/PHACO Left 10/07/2015   Procedure: CATARACT EXTRACTION PHACO AND INTRAOCULAR LENS PLACEMENT (IOC);  Surgeon: Ronnell Freshwater, MD;  Location: [place];  Service: Ophthalmology;  Laterality: Left;  DIABETIC - oral meds VISION BLUE  . CORONARY ANGIOGRAPHY N/A 10/29/2017   Procedure: CORONARY ANGIOGRAPHY;  Surgeon: Dionisio David, MD;  Location: [place] CV LAB;  Service: Cardiovascular;  Laterality: N/A;  . EXPLORATORY LAPAROTOMY     for fibroids  . LEFT HEART CATH Right 10/29/2017   Procedure: Left Heart Cath;  Surgeon: Dionisio David, MD;  Location: [place] CV LAB;  Service: Cardiovascular;  Laterality: Right;  . TONSILLECTOMY      There were no vitals filed for this visit.  Subjective Assessment - 06/23/18 1158    Subjective  Patient reports no new falls; she reports walking some at home without her walker, but will use it when she leaves the house; She is having some right knee pain, "It creaks and cracks."    Pertinent History    85 year old female with adenocarcinoma of the cervix as well as endometrial carcinoma and polycythemia vera as well as diabetes who had hypertensive right cerebellar hemorrhage onset 10/28/2017.  She was treated initially at [facility] and then transferred to [facility].  Neurosurgical evaluation per Dr. Ronnald Ramp concluded no surgery was needed.  The patient completed inpatient rehabilitation at [facility] (including SLP) 11/04/2017-11/18/2107 and was discharged at a 24-hour supervision level.  The patient has hired caregiver to provide 24-hour supervision. Patient has received home health rehab services, including SLP.    Limitations  Standing;Walking    How long can you walk comfortably?  a few minutes    Patient Stated Goals  Patient wants to walk better and not need the RW.     Currently in Pain?  Yes    Pain Score  3     Pain Location  Knee    Pain Orientation  Right    Pain Descriptors / Indicators  Aching;Sore    Pain Type  Chronic pain    Pain Onset  More than a month ago    Pain Frequency  Intermittent    Aggravating Factors    getting up/down    Pain Relieving Factors  rest    Effect of Pain on Daily Activities  decreased standing/walking tolerance;     Multiple Pain Sites  No          Warm up on Nustep, BUE/BLE level 2 x5 min (unbilled);  Seated: Cervical AROM stretch rotation x10 bilaterally, up/down x10 bilaterally; Patient required min-moderate verbal/tactile cues for correct exercise technique including to avoid trunk rotation to isolate cervical movement;  Advanced HEP for better flexibility to improve balance control.  Patient instructed in advanced balance exercise  Gait outside in hallway:  Forward walking with head turns up/down, side/side x141fet each,  Gait in gym with lateral head turns with therapist walking beside patient as if having conversation to challenge dynamic balance with head turned. Patientinitially exhibits unsteadiness with increased foot drag and impaired gait mechanics with slower gait speed; Requires min A for safety, often exhibiting unsteadiness and mis-steps with increased distances. Patient reports increased dizziness and feeling "off balance."  She exhibited less unsteadiness with static head turns when walking when walking as if having a conversation. She was more unsteady when her head was turned to the left compared to the right.  Instructed patient in dynamic balance exercise: Resisted walking,7.5# forward/backward, side/side x4way, x2 laps each direction; required min A for safety and cues to improve weight shift especially with eccentric control for better balance control.Patient also required cues to increase step length, especially with forward/backward walking for reciprocal gait technique;   Weaving around cones #5 x2 sets Stepping over 1/2 and large bolster x2 sets each; Able to progress to close supervision without AD; does require cues to increase step length and improve ankle DF at heel strike for better foot clearance;  Side  stepping over #5 cones x1 set each direction;  Requires min A for safety and cues to increase hip flexion for better foot clearance;   Patient fatigued at end of session;                     PT Education - 06/23/18 1159    Education Details  exercise technique, balance/gait safety;     Person(s) Educated  Patient    Methods  Explanation;Demonstration;Verbal cues    Comprehension  Verbalized understanding;Returned demonstration;Verbal cues required;Need further instruction       PT Short Term Goals - 06/01/18 1428      PT SHORT TERM GOAL #1   Title  Patient will reduce timed up and go to <11 seconds to reduce fall risk and demonstrate improved transfer/gait ability.    Baseline  22.07 sec; 03/24/18: 14.2sec; 04/25/18: 14.0 sec with RW, 06/01/18: 12.7s  Time  2    Period  Weeks    Status  Partially Met    Target Date  06/29/18      PT SHORT TERM GOAL #2   Title  Patient will be independent with ascend/descend 2 steps using single UE in step over step pattern without LOB.    Baseline  03/24/18: CGA, single UE support, step-to pattern without LOB; 04/25/18: CGA, single UE support, step-over-step pattern without LOB; 06/01/18: CGA, single UE support    Time  2    Period  Weeks    Status  On-going    Target Date  06/29/17        PT Long Term Goals - 06/01/18 1428      PT LONG TERM GOAL #1   Title  Patient will be independent in home exercise program to improve strength/mobility for better functional independence with ADLs.    Baseline  04/25/18: unsure but does not remember doing HEP lately; 06/01/18: Pt performs intermittently    Time  8    Period  Weeks    Status  On-going    Target Date  07/27/18      PT LONG TERM GOAL #2   Title  Patient (> 75 years old) will complete five times sit to stand test in < 15 seconds indicating an increased LE strength and improved balance    Baseline  34.21 sec; 03/24/2018: 22.9 sec; 04/25/18: 18.5 sec with BUE support; 06/01/18:  15.5s with BUE support, still unable to perform STS without UE support    Time  8    Period  Weeks    Status  Partially Met    Target Date  07/27/18      PT LONG TERM GOAL #3   Title  Patient will increase six minute walk test distance to >1000 for progression to community ambulator and improve gait ability    Baseline  550 feet; 03/24/18: 795 ft.; 04/25/18: 975 ft with RW; 06/01/18: 1040' with RW    Time  8    Period  Weeks    Status  Achieved      PT LONG TERM GOAL #4   Title  Patient will increase 10 meter walk test to >1.2ms as to improve gait speed for better community ambulation and to reduce fall risk    Baseline  .49 m/sec; 03/24/18: 1.01 m/sec; 06/01/18: self-selected: 9.7s = 1.03 m/s, fastest: 8.0s = 1.25 m/s    Time  --    Period  Weeks    Status  Achieved      PT LONG TERM GOAL #5   Title  Pt will improve BERG by at least 3 points in order to demonstrate clinically significant improvement in balance.      Baseline  06/01/18: 44/56    Time  8    Period  Weeks    Status  New    Target Date  07/27/18            Plan - 06/23/18 1406    Clinical Impression Statement  Patient instructed in advanced balance tasks. Patient is able to walk short distances without AD. She was able to progress to close supervision when negotiating cones/small bolsters. However when walking with lateral and vertical head turns she exhibits immediate loss of balance with mis-steps. Patient instructed in seated cervical ROM exercise to improve cervical flexibility; Advanced HEP to improve flexibility and reduce stiffness. Concerned cervical stiffness could be contributing to some of her imbalance. Patient  would benefit from additional skilled PT Intervention to improve strength, balance and gait safety;     PT Frequency  2x / week    PT Duration  8 weeks    PT Treatment/Interventions  Patient/family education;Neuromuscular re-education;Balance training;Stair training;Therapeutic  activities;Therapeutic exercise;Gait training;Aquatic Therapy    PT Next Visit Plan  balance and strengthening    Consulted and Agree with Plan of Care  Patient       Patient will benefit from skilled therapeutic intervention in order to improve the following deficits and impairments:  Abnormal gait, Decreased balance, Decreased endurance, Decreased mobility, Impaired flexibility, Decreased strength, Decreased knowledge of use of DME, Decreased activity tolerance, Difficulty walking  Visit Diagnosis: Muscle weakness (generalized)  Other lack of coordination  Difficulty in walking, not elsewhere classified     Problem List Patient Active Problem List   Diagnosis Date Noted  . Liver lesion 04/22/2018  . Goals of care, counseling/discussion 04/22/2018  . Cognitive deficit, post-stroke 12/31/2017  . Ataxia, post-stroke 12/31/2017  . Dysarthria, post-stroke   . Gait disturbance, post-stroke   . H/O cerebral parenchymal hemorrhage 11/04/2017  . Essential hypertension 11/04/2017  . Hyperlipidemia 11/04/2017  . Diabetes ([place]) 11/04/2017  . CAD (coronary artery disease) 11/04/2017  . 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 Hypokalemia 11/04/2017  . Right-sided nontraumatic intracerebral hemorrhage of cerebellum ([place])   . History of cervical cancer   . History of TIA (transient ischemic attack)   . Acute systolic congestive heart failure ([place])   . Reactive hypertension   . Hypernatremia   . Leukocytosis   . Acute blood loss anemia   . Elevated serum creatinine   . Acute respiratory failure with hypoxia ([place])   . IVH (intraventricular hemorrhage) ([place]) 10/29/2017  . Hypoxia 10/28/2017  . Pancreatic lesion 05/24/2017  . 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 Nodule of upper lobe of left lung 06/04/2015  . 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 Cerebral thrombosis with cerebral infarction 04/03/2015  . Cancer of right colon ([place]) 03/28/2015  .  CVA (cerebral infarction) 12/21/2014  . Polycythemia vera ([place]) 06/22/2006    Hubbard Seldon PT, DPT 06/23/2018, 2:15 PM  [place] MAIN [facility] SERVICES [address] [place], [place], [address] Phone: [phone]   Fax:  [phone]  Name: [name] MRN: [id number] Date of Birth: [birth year]

## 2018-06-24 NOTE — Therapy (Signed)
[place] MAIN [facility] SERVICES [address] [place], [place], [address] Phone: [phone]   Fax:  [phone]  Occupational Therapy Treatment/Recertification Note  Patient Details  Name: [name] MRN: [id number] Date of Birth: 1931-08-01 No data recorded  Encounter Date: 06/23/2018  OT End of Session - 06/23/18 1113    Visit Number  27    Number of Visits  20    Date for OT Re-Evaluation  09/15/18    Authorization Type  Medicare    Authorization Time Period  visit 6/10 for reporting period starting 05/26/2018    OT Start Time  1100    OT Stop Time  1145    OT Time Calculation (min)  45 min    Activity Tolerance  Patient tolerated treatment well    Behavior During Therapy  [facility] for tasks assessed/performed       Past Medical History:  Diagnosis Date  . Adenocarcinoma in situ of cervix   . Arthritis    hands  . Breast cancer ([place])   . Colon cancer ([place])   . Diabetes mellitus without complication ([place])   . Endometrial carcinoma (HCC)    s/p total abdominal hysterectomy  . H/O compression fracture of spine 2014   thoracic spine  . H/O polycythemia vera   . H/O TIA (transient ischemic attack) and stroke 09/2014, 03/2015   No deficits  . Hypertension   . Hyperuricemia   . Microalbuminuria   . Polycythemia vera ([place])   . Recurrent falls   . Skin cancer    face, legs  . Stroke [facility]) 2008   no deficits  . Trochanteric bursitis   . Varicose veins    treated    Past Surgical History:  Procedure Laterality Date  . ABDOMINAL HYSTERECTOMY    . BOWEL RESECTION N/A 03/28/2015   Procedure: SMALL BOWEL RESECTION;  Surgeon: Leonie Green, MD;  Location: [facility] ORS;  Service: General;  Laterality: N/A;  . CATARACT EXTRACTION W/ INTRAOCULAR LENS IMPLANT Right   . CATARACT EXTRACTION W/PHACO Left 10/07/2015   Procedure: CATARACT EXTRACTION PHACO AND INTRAOCULAR LENS PLACEMENT (IOC);  Surgeon: Ronnell Freshwater, MD;  Location:  [place];  Service: Ophthalmology;  Laterality: Left;  DIABETIC - oral meds VISION BLUE  . CORONARY ANGIOGRAPHY N/A 10/29/2017   Procedure: CORONARY ANGIOGRAPHY;  Surgeon: Dionisio David, MD;  Location: [place] CV LAB;  Service: Cardiovascular;  Laterality: N/A;  . EXPLORATORY LAPAROTOMY     for fibroids  . LEFT HEART CATH Right 10/29/2017   Procedure: Left Heart Cath;  Surgeon: Dionisio David, MD;  Location: [place] CV LAB;  Service: Cardiovascular;  Laterality: Right;  . TONSILLECTOMY      There were no vitals filed for this visit.  Subjective Assessment - 06/23/18 1109    Subjective   Pt. reports having had a nice New Years Holiday.    Pertinent History  85 year old female with adenocarcinoma of the cervix as well as endometrial carcinoma and polycythemia vera as well as diabetes who had hypertensive right cerebellar hemorrhage onset 10/28/2017.  She was treated initially at [facility] and then transferred to [facility].  Neurosurgical evaluation per Dr. Ronnald Ramp concluded no surgery was needed.  The patient completed inpatient rehabilitation at [facility] (including SLP) 11/04/2017-11/18/2107 and was discharged at a 24-hour supervision level.  The patient has hired caregiver to provide 24-hour supervision. Patient has received home health rehab services, including SLP.  Patient Stated Goals  Patient would like to be independent in all tasks and be able to live alone again.    Currently in Pain?  No/denies         [facility] OT Assessment - 06/23/18 1124      Coordination   Right 9 Hole Peg Test  48    Left 9 Hole Peg Test  30      Strength   Overall Strength Comments  Bilateral shoulder flexion, abduction: 4/5, elbow flexion, extension: 5/5       Hand Function   Right Hand Grip (lbs)  21    Right Hand Lateral Pinch  10 lbs    Right Hand 3 Point Pinch  10 lbs    Left Hand Grip (lbs)  24    Left Hand Lateral Pinch  9 lbs    Left 3 point pinch   10 lbs      OT TREATMENT    Measurements were obtained, and goals were reviewed with the pt.  Selfcare:  Pt. Worked on Youth worker. Pt. Worked on legibility, speed, and spacing. Writing legibility was 25% for cursive, 50% for printing. Pt. Was able to maintain a mature grasp on the pen throughout the duration of the writing task. No deviation from the lines present.                    OT Education - 06/23/18 1112    Education Details  Writing legibility    Person(s) Educated  Patient    Methods  Explanation;Demonstration;Verbal cues    Comprehension  Verbalized understanding;Returned demonstration;Verbal cues required          OT Long Term Goals - 06/23/18 1116      OT LONG TERM GOAL #1   Title  Patient will complete bathing with modified independence    Baseline  1/02/20120: Pt. is independent with bathing. pt. requires assist with her back. Pt. has difficulty with washing the bottom of her feet, however is able to complete it.    Time  12    Period  Weeks    Status  Partially Met    Target Date  09/15/18      OT LONG TERM GOAL #2   Title  Patient will complete dressing skills with modified independence    Baseline  06/23/2018: Pt. requires assist with hooking her bra. Pt. requires care giver assist secondary to requiring increased time to complete.    Time  12    Period  Weeks    Status  On-going    Target Date  09/15/18      OT LONG TERM GOAL #3   Title  Patient will complete light meal prep with min assist    Baseline  06/23/2018: Pt. is able to fix herself something light to eat, However requires assist formeal preparation.    Time  12    Period  Weeks    Status  On-going    Target Date  09/15/18      OT LONG TERM GOAL #4   Title  Patient will increase R grip strength by 5# to open jars and containers with modified independence    Baseline  06/23/2018: Grip is improving, however requires assist to open containers.     Time  12    Period  Weeks    Status  On-going    Target Date  09/15/18      OT LONG TERM GOAL #5  Title  Patient will improve strength by 1 mm grade RUE to assist with obtaining items from the closet.     Baseline  06/23/2018: Pt. has made progress, and has difficulty reaching for items in the closet.    Time  8    Period  Weeks    Status  On-going    Target Date  09/15/18      OT LONG TERM GOAL #6   Title  Pt will increase handwriting legibility and speed to 50% while writing a 3 sentence paragraph in order to write thank you notes and birthday cards for friends and family    Baseline  06/23/2018: 50% writing lebility for one sentence with increased time.    Time  5    Period  Weeks    Status  On-going    Target Date  09/15/18            Plan - 06/23/18 1114    Clinical Impression Statement  Pt. is making steady progress overall. Pt. has improved with bathing, and dressing skills, however has difficulty fastening her bra. Pt. continues to have difficulty opening jars/containers. While writing skills have improved, writing legibility and efficiency conitnue to be limited. Pt. continues to require sklilled OT services to work on improving UE strength, grip strength, pinch strength, and [place] skills to be able to write legibly, and efficiently, fasten her bra, open containers, and reach into closets, and cabinetry to retrieve items for ADLs, and self-care.    Occupational Profile and client history currently impacting functional performance  family more than 30 mins away, has 24 hour caregiver assist, decreased awareness, memory and safety    Occupational performance deficits (Please refer to evaluation for details):  ADL's;IADL's;Social Participation    Rehab Potential  Good    Current Impairments/barriers affecting progress:  memory, requires 24 hour caregivers,     OT Frequency  2x / week    OT Duration  12 weeks    OT Treatment/Interventions  Self-care/ADL  training;Cryotherapy;Therapeutic exercise;DME and/or AE instruction;Functional Mobility Training;Cognitive remediation/compensation;Balance training;Neuromuscular education;Manual Therapy;Moist Heat;Therapeutic activities;Patient/family education    Clinical Decision Making  Several treatment options, min-mod task modification necessary    Consulted and Agree with Plan of Care  Patient    Family Member Consulted  Caregiver       Patient will benefit from skilled therapeutic intervention in order to improve the following deficits and impairments:  Decreased balance, Decreased mobility, Difficulty walking, Decreased cognition, Decreased activity tolerance, Decreased coordination, Decreased safety awareness, Decreased strength, Impaired UE functional use  Visit Diagnosis: Muscle weakness (generalized)  Other lack of coordination    Problem List Patient Active Problem List   Diagnosis Date Noted  . Liver lesion 04/22/2018  . Goals of care, counseling/discussion 04/22/2018  . Cognitive deficit, post-stroke 12/31/2017  . Ataxia, post-stroke 12/31/2017  . Dysarthria, post-stroke   . Gait disturbance, post-stroke   . H/O cerebral parenchymal hemorrhage 11/04/2017  . Essential hypertension 11/04/2017  . Hyperlipidemia 11/04/2017  . Diabetes ([place]) 11/04/2017  . CAD (coronary artery disease) 11/04/2017  . 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 Hypokalemia 11/04/2017  . Right-sided nontraumatic intracerebral hemorrhage of cerebellum ([place])   . History of cervical cancer   . History of TIA (transient ischemic attack)   . Acute systolic congestive heart failure ([place])   . Reactive hypertension   . Hypernatremia   . Leukocytosis   . Acute blood loss anemia   . Elevated serum creatinine   . Acute  respiratory failure with hypoxia ([place])   . IVH (intraventricular hemorrhage) ([place]) 10/29/2017  . Hypoxia 10/28/2017  . Pancreatic lesion 05/24/2017  . 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  Nodule of upper lobe of left lung 06/04/2015  . 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 Cerebral thrombosis with cerebral infarction 04/03/2015  . Cancer of right colon ([place]) 03/28/2015  . CVA (cerebral infarction) 12/21/2014  . Polycythemia vera ([place]) 06/22/2006    Harrel Carina, MS, OTR/L 06/24/2018, 12:27 PM  [place] MAIN [facility] SERVICES [address] [place], [place], [address] Phone: [phone]   Fax:  [phone]  Name: [name] MRN: [id number] Date of Birth: 01/18/1932

## 2018-06-25 ENCOUNTER — Encounter: Payer: Self-pay | Admitting: Hematology and Oncology

## 2018-06-27 ENCOUNTER — Ambulatory Visit: Admission: RE | Admit: 2018-06-27 | Payer: Medicare Other | Source: Ambulatory Visit

## 2018-06-28 ENCOUNTER — Ambulatory Visit: Payer: Medicare Other | Admitting: Occupational Therapy

## 2018-06-28 ENCOUNTER — Encounter: Payer: Self-pay | Admitting: Occupational Therapy

## 2018-06-28 DIAGNOSIS — M6281 Muscle weakness (generalized): Secondary | ICD-10-CM

## 2018-06-28 DIAGNOSIS — R278 Other lack of coordination: Secondary | ICD-10-CM

## 2018-06-28 NOTE — Therapy (Signed)
[place] MAIN [facility] SERVICES [address] [place], [place], [address] Phone: [phone]   Fax:  [phone]  Occupational Therapy Treatment  Patient Details  Name: [name] MRN: [id number] Date of Birth: August 24, 1931 No data recorded  Encounter Date: 06/28/2018  OT End of Session - 06/28/18 1110    Visit Number  28    Number of Visits  42    Date for OT Re-Evaluation  09/15/18    Authorization Type  Medicare    Authorization Time Period  visit 7/10 for reporting period starting 05/26/2018    OT Start Time  1100    OT Stop Time  1145    OT Time Calculation (min)  45 min    Activity Tolerance  Patient tolerated treatment well    Behavior During Therapy  [facility] for tasks assessed/performed       Past Medical History:  Diagnosis Date  . Adenocarcinoma in situ of cervix   . Arthritis    hands  . Breast cancer ([place])   . Colon cancer ([place])   . Diabetes mellitus without complication ([place])   . Endometrial carcinoma (HCC)    s/p total abdominal hysterectomy  . H/O compression fracture of spine 2014   thoracic spine  . H/O polycythemia vera   . H/O TIA (transient ischemic attack) and stroke 09/2014, 03/2015   No deficits  . Hypertension   . Hyperuricemia   . Microalbuminuria   . Polycythemia vera ([place])   . Recurrent falls   . Skin cancer    face, legs  . Stroke [facility]) 2008   no deficits  . Trochanteric bursitis   . Varicose veins    treated    Past Surgical History:  Procedure Laterality Date  . ABDOMINAL HYSTERECTOMY    . BOWEL RESECTION N/A 03/28/2015   Procedure: SMALL BOWEL RESECTION;  Surgeon: Leonie Green, MD;  Location: [facility] ORS;  Service: General;  Laterality: N/A;  . CATARACT EXTRACTION W/ INTRAOCULAR LENS IMPLANT Right   . CATARACT EXTRACTION W/PHACO Left 10/07/2015   Procedure: CATARACT EXTRACTION PHACO AND INTRAOCULAR LENS PLACEMENT (IOC);  Surgeon: Ronnell Freshwater, MD;  Location: [place];   Service: Ophthalmology;  Laterality: Left;  DIABETIC - oral meds VISION BLUE  . CORONARY ANGIOGRAPHY N/A 10/29/2017   Procedure: CORONARY ANGIOGRAPHY;  Surgeon: Dionisio David, MD;  Location: [place] CV LAB;  Service: Cardiovascular;  Laterality: N/A;  . EXPLORATORY LAPAROTOMY     for fibroids  . LEFT HEART CATH Right 10/29/2017   Procedure: Left Heart Cath;  Surgeon: Dionisio David, MD;  Location: [place] CV LAB;  Service: Cardiovascular;  Laterality: Right;  . TONSILLECTOMY      There were no vitals filed for this visit.  Subjective Assessment - 06/28/18 1107    Subjective   Pt. had an MD appointment with her primary care physician.    Pertinent History  85 year old female with adenocarcinoma of the cervix as well as endometrial carcinoma and polycythemia vera as well as diabetes who had hypertensive right cerebellar hemorrhage onset 10/28/2017.  She was treated initially at [facility] and then transferred to [facility].  Neurosurgical evaluation per Dr. Ronnald Ramp concluded no surgery was needed.  The patient completed inpatient rehabilitation at [facility] (including SLP) 11/04/2017-11/18/2107 and was discharged at a 24-hour supervision level.  The patient has hired caregiver to provide 24-hour supervision. Patient has received home health rehab services, including SLP.  Patient Stated Goals  Patient would like to be independent in all tasks and be able to live alone again.    Currently in Pain?  Yes      OT TREATMENT    Neuro muscular re-education:  Pt. worked on grasping, flipping and stacking 2" large pegs on the Instructo board placed at a tabletop surface.  Self-care:  Pt. worked on Building surveyor, and Media planner. Pt. presented with 50% writing legibility in printed form when writing out event schedule information.  Pt. was provided with reading glasses.                          OT Education - 06/28/18 1109    Education  Details  Therapist, sports) Educated  Patient    Methods  Explanation;Demonstration;Verbal cues    Comprehension  Verbalized understanding;Returned demonstration;Verbal cues required          OT Long Term Goals - 06/23/18 1116      OT LONG TERM GOAL #1   Title  Patient will complete bathing with modified independence    Baseline  1/02/20120: Pt. is independent with bathing. pt. requires assist with her back. Pt. has difficulty with washing the bottom of her feet, however is able to complete it.    Time  12    Period  Weeks    Status  Partially Met    Target Date  09/15/18      OT LONG TERM GOAL #2   Title  Patient will complete dressing skills with modified independence    Baseline  06/23/2018: Pt. requires assist with hooking her bra. Pt. requires care giver assist secondary to requiring increased time to complete.    Time  12    Period  Weeks    Status  On-going    Target Date  09/15/18      OT LONG TERM GOAL #3   Title  Patient will complete light meal prep with min assist    Baseline  06/23/2018: Pt. is able to fix herself something light to eat, However requires assist formeal preparation.    Time  12    Period  Weeks    Status  On-going    Target Date  09/15/18      OT LONG TERM GOAL #4   Title  Patient will increase R grip strength by 5# to open jars and containers with modified independence    Baseline  06/23/2018: Grip is improving, however requires assist to open containers.    Time  12    Period  Weeks    Status  On-going    Target Date  09/15/18      OT LONG TERM GOAL #5   Title  Patient will improve strength by 1 mm grade RUE to assist with obtaining items from the closet.     Baseline  06/23/2018: Pt. has made progress, and has difficulty reaching for items in the closet.    Time  8    Period  Weeks    Status  On-going    Target Date  09/15/18      OT LONG TERM GOAL #6   Title  Pt will increase handwriting legibility and speed to 50% while  writing a 3 sentence paragraph in order to write thank you notes and birthday cards for friends and family    Baseline  06/23/2018: 50% writing lebility for one sentence with increased time.  Time  5    Period  Weeks    Status  On-going    Target Date  09/15/18            Plan - 06/28/18 1111    Clinical Impression Statement Pt. continues to make steady progress overall. Pt. presents with 50% writing legibility skills, and limited [facility] skills. Pt. continues to work on improving overall UE hand function, and [facility] skills for in preparation for improved participation in ADLs, and IADL tasks, as well as to improve writing legibility skills for checkwriting, filling out cards, and writing stationary note correspondence.   Occupational Profile and client history currently impacting functional performance  family more than 30 mins away, has 24 hour caregiver assist, decreased awareness, memory and safety    Occupational performance deficits (Please refer to evaluation for details):  ADL's;IADL's;Social Participation    Rehab Potential  Good    Current Impairments/barriers affecting progress:  memory, requires 24 hour caregivers,     OT Frequency  2x / week    OT Duration  12 weeks    OT Treatment/Interventions  Self-care/ADL training;Cryotherapy;Therapeutic exercise;DME and/or AE instruction;Functional Mobility Training;Cognitive remediation/compensation;Balance training;Neuromuscular education;Manual Therapy;Moist Heat;Therapeutic activities;Patient/family education    Clinical Decision Making  Several treatment options, min-mod task modification necessary    Consulted and Agree with Plan of Care  Patient    Family Member Consulted  Caregiver       Patient will benefit from skilled therapeutic intervention in order to improve the following deficits and impairments:  Decreased balance, Decreased mobility, Difficulty walking, Decreased cognition, Decreased activity tolerance, Decreased  coordination, Decreased safety awareness, Decreased strength, Impaired UE functional use  Visit Diagnosis: Muscle weakness (generalized)  Other lack of coordination    Problem List Patient Active Problem List   Diagnosis Date Noted  . Liver lesion 04/22/2018  . Goals of care, counseling/discussion 04/22/2018  . Cognitive deficit, post-stroke 12/31/2017  . Ataxia, post-stroke 12/31/2017  . Dysarthria, post-stroke   . Gait disturbance, post-stroke   . H/O cerebral parenchymal hemorrhage 11/04/2017  . Essential hypertension 11/04/2017  . Hyperlipidemia 11/04/2017  . Diabetes ([place]) 11/04/2017  . CAD (coronary artery disease) 11/04/2017  . 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 Hypokalemia 11/04/2017  . Right-sided nontraumatic intracerebral hemorrhage of cerebellum ([place])   . History of cervical cancer   . History of TIA (transient ischemic attack)   . Acute systolic congestive heart failure ([place])   . Reactive hypertension   . Hypernatremia   . Leukocytosis   . Acute blood loss anemia   . Elevated serum creatinine   . Acute respiratory failure with hypoxia ([place])   . IVH (intraventricular hemorrhage) ([place]) 10/29/2017  . Hypoxia 10/28/2017  . Pancreatic lesion 05/24/2017  . 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 Nodule of upper lobe of left lung 06/04/2015  . 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 Cerebral thrombosis with cerebral infarction 04/03/2015  . Cancer of right colon ([place]) 03/28/2015  . CVA (cerebral infarction) 12/21/2014  . Polycythemia vera ([place]) 06/22/2006    Harrel Carina, MS, OTR/L 06/28/2018, 11:32 AM  [place] MAIN [facility] SERVICES [address] [place], [place], [address] Phone: [phone]   Fax:  [phone]  Name: [name] MRN: [id number] Date of Birth: 1932/03/21

## 2018-06-30 ENCOUNTER — Encounter: Payer: Self-pay | Admitting: Physical Therapy

## 2018-06-30 ENCOUNTER — Encounter: Payer: Self-pay | Admitting: Occupational Therapy

## 2018-06-30 ENCOUNTER — Ambulatory Visit: Payer: Medicare Other | Admitting: Physical Therapy

## 2018-06-30 ENCOUNTER — Ambulatory Visit: Payer: Medicare Other | Admitting: Occupational Therapy

## 2018-06-30 ENCOUNTER — Other Ambulatory Visit: Payer: Medicare Other

## 2018-06-30 ENCOUNTER — Ambulatory Visit: Payer: Medicare Other | Admitting: Hematology and Oncology

## 2018-06-30 DIAGNOSIS — R262 Difficulty in walking, not elsewhere classified: Secondary | ICD-10-CM

## 2018-06-30 DIAGNOSIS — M6281 Muscle weakness (generalized): Secondary | ICD-10-CM | POA: Diagnosis not present

## 2018-06-30 DIAGNOSIS — R278 Other lack of coordination: Secondary | ICD-10-CM

## 2018-06-30 NOTE — Therapy (Addendum)
[place] MAIN [facility] SERVICES [address] [place], [place], [address] Phone: [phone]   Fax:  [phone]  Occupational Therapy Treatment  Patient Details  Name: [name] MRN: [id number] Date of Birth: May 13, 1932 No data recorded  Encounter Date: 06/30/2018  OT End of Session - 06/30/18 1157    Visit Number  29    Number of Visits  74    Date for OT Re-Evaluation  09/15/18    Authorization Type  Medicare    Authorization Time Period  visit 8/10 for reporting period starting 05/26/2018    OT Start Time  1150    OT Stop Time  1230    OT Time Calculation (min)  40 min    Activity Tolerance  Patient tolerated treatment well    Behavior During Therapy  [facility] for tasks assessed/performed       Past Medical History:  Diagnosis Date  . Adenocarcinoma in situ of cervix   . Arthritis    hands  . Breast cancer ([place])   . Colon cancer ([place])   . Diabetes mellitus without complication ([place])   . Endometrial carcinoma (HCC)    s/p total abdominal hysterectomy  . H/O compression fracture of spine 2014   thoracic spine  . H/O polycythemia vera   . H/O TIA (transient ischemic attack) and stroke 09/2014, 03/2015   No deficits  . Hypertension   . Hyperuricemia   . Microalbuminuria   . Polycythemia vera ([place])   . Recurrent falls   . Skin cancer    face, legs  . Stroke [facility]) 2008   no deficits  . Trochanteric bursitis   . Varicose veins    treated    Past Surgical History:  Procedure Laterality Date  . ABDOMINAL HYSTERECTOMY    . BOWEL RESECTION N/A 03/28/2015   Procedure: SMALL BOWEL RESECTION;  Surgeon: Leonie Green, MD;  Location: [facility] ORS;  Service: General;  Laterality: N/A;  . CATARACT EXTRACTION W/ INTRAOCULAR LENS IMPLANT Right   . CATARACT EXTRACTION W/PHACO Left 10/07/2015   Procedure: CATARACT EXTRACTION PHACO AND INTRAOCULAR LENS PLACEMENT (IOC);  Surgeon: Ronnell Freshwater, MD;  Location: [place];   Service: Ophthalmology;  Laterality: Left;  DIABETIC - oral meds VISION BLUE  . CORONARY ANGIOGRAPHY N/A 10/29/2017   Procedure: CORONARY ANGIOGRAPHY;  Surgeon: Dionisio David, MD;  Location: [place] CV LAB;  Service: Cardiovascular;  Laterality: N/A;  . EXPLORATORY LAPAROTOMY     for fibroids  . LEFT HEART CATH Right 10/29/2017   Procedure: Left Heart Cath;  Surgeon: Dionisio David, MD;  Location: [place] CV LAB;  Service: Cardiovascular;  Laterality: Right;  . TONSILLECTOMY      There were no vitals filed for this visit.  Subjective Assessment - 06/30/18 1155    Subjective   Pt. reports she has a stiff neck today.    Pertinent History  85 year old female with adenocarcinoma of the cervix as well as endometrial carcinoma and polycythemia vera as well as diabetes who had hypertensive right cerebellar hemorrhage onset 10/28/2017.  She was treated initially at [facility] and then transferred to [facility].  Neurosurgical evaluation per Dr. Ronnald Ramp concluded no surgery was needed.  The patient completed inpatient rehabilitation at [facility] (including SLP) 11/04/2017-11/18/2107 and was discharged at a 24-hour supervision level.  The patient has hired caregiver to provide 24-hour supervision. Patient has received home health rehab services, including SLP.  Patient Stated Goals  Patient would like to be independent in all tasks and be able to live alone again.    Currently in Pain?  Yes    Pain Score  0-No pain      OT TREATMENT    Selfcare:  Pt. worked on Estate agent tasks, and Dietitian lists of words while focusing on writing legibility. Pt. demonstrated using a mature grasp on a standard pen. Pt. presented with 25% legibility for cursive form, and 50% legibility for printed form. Pt. worked on Estate agent tasks in preparation for [employer], and Psychologist, prison and probation services notes. Pt.has difficulty with motor control, and coordination  formulating letters especially "S".                         OT Education - 06/30/18 1157    Education Details  Writng legibility    Person(s) Educated  Patient    Methods  Explanation;Demonstration;Verbal cues    Comprehension  Verbalized understanding;Returned demonstration;Verbal cues required          OT Long Term Goals - 06/23/18 1116      OT LONG TERM GOAL #1   Title  Patient will complete bathing with modified independence    Baseline  1/02/20120: Pt. is independent with bathing. pt. requires assist with her back. Pt. has difficulty with washing the bottom of her feet, however is able to complete it.    Time  12    Period  Weeks    Status  Partially Met    Target Date  09/15/18      OT LONG TERM GOAL #2   Title  Patient will complete dressing skills with modified independence    Baseline  06/23/2018: Pt. requires assist with hooking her bra. Pt. requires care giver assist secondary to requiring increased time to complete.    Time  12    Period  Weeks    Status  On-going    Target Date  09/15/18      OT LONG TERM GOAL #3   Title  Patient will complete light meal prep with min assist    Baseline  06/23/2018: Pt. is able to fix herself something light to eat, However requires assist formeal preparation.    Time  12    Period  Weeks    Status  On-going    Target Date  09/15/18      OT LONG TERM GOAL #4   Title  Patient will increase R grip strength by 5# to open jars and containers with modified independence    Baseline  06/23/2018: Grip is improving, however requires assist to open containers.    Time  12    Period  Weeks    Status  On-going    Target Date  09/15/18      OT LONG TERM GOAL #5   Title  Patient will improve strength by 1 mm grade RUE to assist with obtaining items from the closet.     Baseline  06/23/2018: Pt. has made progress, and has difficulty reaching for items in the closet.    Time  8    Period  Weeks    Status  On-going     Target Date  09/15/18      OT LONG TERM GOAL #6   Title  Pt will increase handwriting legibility and speed to 50% while writing a 3 sentence paragraph in order to write thank you notes and birthday  cards for friends and family    Baseline  06/23/2018: 50% writing lebility for one sentence with increased time.    Time  5    Period  Weeks    Status  On-going    Target Date  09/15/18            Plan - 06/30/18 1158    Clinical Impression Statement  Pt. reports having a stiff neck today. Pt. presents with decreased RUE strength, and [facility] skills which limit her ability to complete daily ADL, and IADL tasks. Pt. continues to work on improving RUE strength, and [facility] skills in order to improve legibility inp preparation for writing checks, and cards, reaching into cabinetry, and fastening her bra.     Occupational Profile and client history currently impacting functional performance  family more than 30 mins away, has 24 hour caregiver assist, decreased awareness, memory and safety    Occupational performance deficits (Please refer to evaluation for details):  ADL's;IADL's;Social Participation    Current Impairments/barriers affecting progress:  memory, requires 24 hour caregivers,     OT Frequency  2x / week    OT Duration  12 weeks    OT Treatment/Interventions  Self-care/ADL training;Cryotherapy;Therapeutic exercise;DME and/or AE instruction;Functional Mobility Training;Cognitive remediation/compensation;Balance training;Neuromuscular education;Manual Therapy;Moist Heat;Therapeutic activities;Patient/family education    Clinical Decision Making  Several treatment options, min-mod task modification necessary    Consulted and Agree with Plan of Care  Patient    Family Member Consulted  Caregiver       Patient will benefit from skilled therapeutic intervention in order to improve the following deficits and impairments:  Decreased balance, Decreased mobility, Difficulty walking, Decreased  cognition, Decreased activity tolerance, Decreased coordination, Decreased safety awareness, Decreased strength, Impaired UE functional use  Visit Diagnosis: Muscle weakness (generalized)  Other lack of coordination    Problem List Patient Active Problem List   Diagnosis Date Noted  . Liver lesion 04/22/2018  . Goals of care, counseling/discussion 04/22/2018  . Cognitive deficit, post-stroke 12/31/2017  . Ataxia, post-stroke 12/31/2017  . Dysarthria, post-stroke   . Gait disturbance, post-stroke   . H/O cerebral parenchymal hemorrhage 11/04/2017  . Essential hypertension 11/04/2017  . Hyperlipidemia 11/04/2017  . Diabetes ([place]) 11/04/2017  . CAD (coronary artery disease) 11/04/2017  . 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 Hypokalemia 11/04/2017  . Right-sided nontraumatic intracerebral hemorrhage of cerebellum ([place])   . History of cervical cancer   . History of TIA (transient ischemic attack)   . Acute systolic congestive heart failure ([place])   . Reactive hypertension   . Hypernatremia   . Leukocytosis   . Acute blood loss anemia   . Elevated serum creatinine   . Acute respiratory failure with hypoxia ([place])   . IVH (intraventricular hemorrhage) ([place]) 10/29/2017  . Hypoxia 10/28/2017  . Pancreatic lesion 05/24/2017  . 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 Nodule of upper lobe of left lung 06/04/2015  . 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 Cerebral thrombosis with cerebral infarction 04/03/2015  . Cancer of right colon ([place]) 03/28/2015  . CVA (cerebral infarction) 12/21/2014  . Polycythemia vera ([place]) 06/22/2006    Harrel Carina, MS, OTR/L 06/30/2018, 12:19 PM  [facility] MAIN [facility] SERVICES [address] [place], [place], [address] Phone: [phone]   Fax:  [phone]  Name: HELLON VACCARELLA MRN: [id number] Date of Birth: 1932/04/19

## 2018-06-30 NOTE — Therapy (Signed)
[place] MAIN [facility] SERVICES [address] [place], [place], [address] Phone: [phone]   Fax:  [phone]  Physical Therapy Treatment  Patient Details  Name: [name] MRN: [id number] Date of Birth: May 07, 1932 Referring Provider (PT): Charlett Blake   Encounter Date: 06/30/2018  PT End of Session - 06/30/18 1146    Visit Number  26    Number of Visits  41    Date for PT Re-Evaluation  07/27/18    Authorization Type  6/20, start of care 01/31/18, last goals 41/96/22 (recert)    PT Start Time  1102    PT Stop Time  1145    PT Time Calculation (min)  43 min    Equipment Utilized During Treatment  Gait belt    Activity Tolerance  Patient tolerated treatment well    Behavior During Therapy  WFL for tasks assessed/performed       Past Medical History:  Diagnosis Date  . Adenocarcinoma in situ of cervix   . Arthritis    hands  . Breast cancer ([place])   . Colon cancer ([place])   . Diabetes mellitus without complication ([place])   . Endometrial carcinoma (HCC)    s/p total abdominal hysterectomy  . H/O compression fracture of spine 2014   thoracic spine  . H/O polycythemia vera   . H/O TIA (transient ischemic attack) and stroke 09/2014, 03/2015   No deficits  . Hypertension   . Hyperuricemia   . Microalbuminuria   . Polycythemia vera ([place])   . Recurrent falls   . Skin cancer    face, legs  . Stroke [facility]) 2008   no deficits  . Trochanteric bursitis   . Varicose veins    treated    Past Surgical History:  Procedure Laterality Date  . ABDOMINAL HYSTERECTOMY    . BOWEL RESECTION N/A 03/28/2015   Procedure: SMALL BOWEL RESECTION;  Surgeon: Leonie Green, MD;  Location: [facility] ORS;  Service: General;  Laterality: N/A;  . CATARACT EXTRACTION W/ INTRAOCULAR LENS IMPLANT Right   . CATARACT EXTRACTION W/PHACO Left 10/07/2015   Procedure: CATARACT EXTRACTION PHACO AND INTRAOCULAR LENS PLACEMENT (IOC);  Surgeon: Ronnell Freshwater, MD;  Location: [place];  Service: Ophthalmology;  Laterality: Left;  DIABETIC - oral meds VISION BLUE  . CORONARY ANGIOGRAPHY N/A 10/29/2017   Procedure: CORONARY ANGIOGRAPHY;  Surgeon: Dionisio David, MD;  Location: [place] CV LAB;  Service: Cardiovascular;  Laterality: N/A;  . EXPLORATORY LAPAROTOMY     for fibroids  . LEFT HEART CATH Right 10/29/2017   Procedure: Left Heart Cath;  Surgeon: Dionisio David, MD;  Location: [place] CV LAB;  Service: Cardiovascular;  Laterality: Right;  . TONSILLECTOMY      There were no vitals filed for this visit.  Subjective Assessment - 06/30/18 1109    Subjective  Patient reports doing well. She reports walking some at home without her walker. She denies any recent falls; She reports some soreness in right knee as a result of her stroke;     Pertinent History    85 year old female with adenocarcinoma of the cervix as well as endometrial carcinoma and polycythemia vera as well as diabetes who had hypertensive right cerebellar hemorrhage onset 10/28/2017.  She was treated initially at [facility] and then transferred to [facility].  Neurosurgical evaluation per Dr. Ronnald Ramp concluded no surgery was needed.  The patient completed inpatient rehabilitation at [facility] (including  SLP) 11/04/2017-11/18/2107 and was discharged at a 24-hour supervision level.  The patient has hired caregiver to provide 24-hour supervision. Patient has received home health rehab services, including SLP.    Limitations  Standing;Walking    How long can you walk comfortably?  a few minutes    Patient Stated Goals  Patient wants to walk better and not need the RW.     Currently in Pain?  Yes    Pain Score  5     Pain Location  Knee    Pain Orientation  Right    Pain Descriptors / Indicators  Aching;Sore    Pain Type  Chronic pain    Pain Onset  More than a month ago    Pain Frequency  Intermittent    Aggravating Factors   getting  up/down    Pain Relieving Factors  rest    Effect of Pain on Daily Activities  decreased standing/walking tolerance;     Multiple Pain Sites  No              Warm up on Nustep, BUE/BLE level 2 x5 min (unbilled);  Patient instructed in advanced balance exercise  Gait outside in hallway:  Forward walking with head turns up/down, side/side x136fet each,  Gait in hallway with lateral head turns side/side x160 feet; Required mod VCs to increase gait speed and increase ankle DF at heel strike especially on RLE for better foot clearance; Patient able to exhibit less instability with increased gait speed being able to progress from min A to close supervision;  Instructed patient in dynamic balance exercise: Resisted walking,12.5# forward/backward, side/side x4way, x2 laps each direction; required min A for safety and cues to improve weight shift especially with eccentric control for better balance control.Patient also required cues to increase step length, especially with forward/backward walking for reciprocal gait technique;   Weaving around cones #5 x4 sets Required supervision with cones wide apart, requiring mod VCs to improve erect posture and avoid looking down at floor; Required min A and exhibited loss of balance with cones closer together having difficulty with narrow base of support;  Instructed patient in figure 8 walking forward x5 laps intially starting with wide figure 8, progressing to narrow figure 8, requiring min A for safety; Backward walking figure 8 requiring min A x2 laps; exhibits decreased step length and instability when walking backwards during figure 8;   Standing on airex: Feet together eyes open/closed 10 sec hold x2 sets each, no loss of balance; Modified tandem stance, eyes open/closed 10 sec hold x2 sets each, no loss of balance; Modified tandem stance, eyes open, head turns side/side x5 reps each; Patient required min VCs for  correct exercise technique, exhibits no instability with advanced exercise;  Patient fatigued at end of session; Denies any increase in pain;                     PT Education - 06/30/18 1110    Education Details  exercise technique, balance/gait safety    Person(s) Educated  Patient    Methods  Explanation;Demonstration;Verbal cues    Comprehension  Verbalized understanding;Returned demonstration;Verbal cues required;Need further instruction       PT Short Term Goals - 06/01/18 1428      PT SHORT TERM GOAL #1   Title  Patient will reduce timed up and go to <11 seconds to reduce fall risk and demonstrate improved transfer/gait ability.    Baseline  22.07 sec; 03/24/18: 14.2sec; 04/25/18:  14.0 sec with RW, 06/01/18: 12.7s    Time  2    Period  Weeks    Status  Partially Met    Target Date  06/29/18      PT SHORT TERM GOAL #2   Title  Patient will be independent with ascend/descend 2 steps using single UE in step over step pattern without LOB.    Baseline  03/24/18: CGA, single UE support, step-to pattern without LOB; 04/25/18: CGA, single UE support, step-over-step pattern without LOB; 06/01/18: CGA, single UE support    Time  2    Period  Weeks    Status  On-going    Target Date  06/29/17        PT Long Term Goals - 06/01/18 1428      PT LONG TERM GOAL #1   Title  Patient will be independent in home exercise program to improve strength/mobility for better functional independence with ADLs.    Baseline  04/25/18: unsure but does not remember doing HEP lately; 06/01/18: Pt performs intermittently    Time  8    Period  Weeks    Status  On-going    Target Date  07/27/18      PT LONG TERM GOAL #2   Title  Patient (> 41 years old) will complete five times sit to stand test in < 15 seconds indicating an increased LE strength and improved balance    Baseline  34.21 sec; 03/24/2018: 22.9 sec; 04/25/18: 18.5 sec with BUE support; 06/01/18: 15.5s with BUE support, still  unable to perform STS without UE support    Time  8    Period  Weeks    Status  Partially Met    Target Date  07/27/18      PT LONG TERM GOAL #3   Title  Patient will increase six minute walk test distance to >1000 for progression to community ambulator and improve gait ability    Baseline  550 feet; 03/24/18: 795 ft.; 04/25/18: 975 ft with RW; 06/01/18: 1040' with RW    Time  8    Period  Weeks    Status  Achieved      PT LONG TERM GOAL #4   Title  Patient will increase 10 meter walk test to >1.41ms as to improve gait speed for better community ambulation and to reduce fall risk    Baseline  .49 m/sec; 03/24/18: 1.01 m/sec; 06/01/18: self-selected: 9.7s = 1.03 m/s, fastest: 8.0s = 1.25 m/s    Time  --    Period  Weeks    Status  Achieved      PT LONG TERM GOAL #5   Title  Pt will improve BERG by at least 3 points in order to demonstrate clinically significant improvement in balance.      Baseline  06/01/18: 44/56    Time  8    Period  Weeks    Status  New    Target Date  07/27/18            Plan - 06/30/18 1239    Clinical Impression Statement  Patient is progressing fair. Focused on static and dynamic balance activities. She exhibits no instability with static standing on airex pad with eyes open/closed; however during ambulation she will veer side/side especially with narrow base of support. She was able to progress to close supervision with walking with head turns; Patient would benefit from additional skilled PT intervention to improve dynamic balance with walking. Her goal  is to get back to walking without her walker.     PT Frequency  2x / week    PT Duration  8 weeks    PT Treatment/Interventions  Patient/family education;Neuromuscular re-education;Balance training;Stair training;Therapeutic activities;Therapeutic exercise;Gait training;Aquatic Therapy    PT Next Visit Plan  balance and strengthening    Consulted and Agree with Plan of Care  Patient       Patient  will benefit from skilled therapeutic intervention in order to improve the following deficits and impairments:  Abnormal gait, Decreased balance, Decreased endurance, Decreased mobility, Impaired flexibility, Decreased strength, Decreased knowledge of use of DME, Decreased activity tolerance, Difficulty walking  Visit Diagnosis: Muscle weakness (generalized)  Other lack of coordination  Difficulty in walking, not elsewhere classified     Problem List Patient Active Problem List   Diagnosis Date Noted  . Liver lesion 04/22/2018  . Goals of care, counseling/discussion 04/22/2018  . Cognitive deficit, post-stroke 12/31/2017  . Ataxia, post-stroke 12/31/2017  . Dysarthria, post-stroke   . Gait disturbance, post-stroke   . H/O cerebral parenchymal hemorrhage 11/04/2017  . Essential hypertension 11/04/2017  . Hyperlipidemia 11/04/2017  . Diabetes ([place]) 11/04/2017  . CAD (coronary artery disease) 11/04/2017  . 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 Hypokalemia 11/04/2017  . Right-sided nontraumatic intracerebral hemorrhage of cerebellum ([place])   . History of cervical cancer   . History of TIA (transient ischemic attack)   . Acute systolic congestive heart failure ([place])   . Reactive hypertension   . Hypernatremia   . Leukocytosis   . Acute blood loss anemia   . Elevated serum creatinine   . Acute respiratory failure with hypoxia ([place])   . IVH (intraventricular hemorrhage) ([place]) 10/29/2017  . Hypoxia 10/28/2017  . Pancreatic lesion 05/24/2017  . 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 Nodule of upper lobe of left lung 06/04/2015  . 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 Cerebral thrombosis with cerebral infarction 04/03/2015  . Cancer of right colon ([place]) 03/28/2015  . CVA (cerebral infarction) 12/21/2014  . Polycythemia vera ([place]) 06/22/2006    Mikaia Janvier PT, DPT 06/30/2018, 12:42 PM  [place] MAIN  [facility] SERVICES [address] [place], [place], [address] Phone: [phone]   Fax:  [phone]  Name: MARLY SCHULD MRN: [id number] Date of Birth: 1931/09/12

## 2018-07-01 ENCOUNTER — Ambulatory Visit: Payer: Medicare Other | Admitting: Hematology and Oncology

## 2018-07-01 ENCOUNTER — Other Ambulatory Visit: Payer: Medicare Other

## 2018-07-04 ENCOUNTER — Ambulatory Visit
Admission: RE | Admit: 2018-07-04 | Discharge: 2018-07-04 | Disposition: A | Discharge status: Home / Self Care | Payer: Medicare Other | Source: Ambulatory Visit | Attending: Urgent Care | Admitting: Urgent Care

## 2018-07-04 DIAGNOSIS — K869 Disease of pancreas, unspecified: Secondary | ICD-10-CM | POA: Diagnosis present

## 2018-07-04 DIAGNOSIS — K769 Liver disease, unspecified: Secondary | ICD-10-CM | POA: Diagnosis not present

## 2018-07-05 ENCOUNTER — Ambulatory Visit: Payer: Medicare Other | Admitting: Physical Therapy

## 2018-07-05 ENCOUNTER — Encounter: Payer: Self-pay | Admitting: Physical Therapy

## 2018-07-05 ENCOUNTER — Encounter: Payer: Self-pay | Admitting: Occupational Therapy

## 2018-07-05 ENCOUNTER — Ambulatory Visit: Payer: Medicare Other | Admitting: Occupational Therapy

## 2018-07-05 DIAGNOSIS — M6281 Muscle weakness (generalized): Secondary | ICD-10-CM

## 2018-07-05 DIAGNOSIS — R278 Other lack of coordination: Secondary | ICD-10-CM

## 2018-07-05 DIAGNOSIS — R262 Difficulty in walking, not elsewhere classified: Secondary | ICD-10-CM

## 2018-07-05 NOTE — Therapy (Signed)
[place] MAIN [facility] SERVICES [address] [place], [place], [address] Phone: [phone]   Fax:  [phone]  Physical Therapy Treatment  Patient Details  Name: [name] MRN: [id number] Date of Birth: [birth year] Referring Provider (PT): Charlett Blake   Encounter Date: 07/05/2018  PT End of Session - 07/05/18 1054    Visit Number  27    Number of Visits  41    Date for PT Re-Evaluation  07/27/18    Authorization Type  7/10, start of care 01/31/18, last goals 99/37/16 (recert)    PT Start Time  1050    PT Stop Time  1135    PT Time Calculation (min)  45 min    Equipment Utilized During Treatment  Gait belt    Activity Tolerance  Patient tolerated treatment well    Behavior During Therapy  WFL for tasks assessed/performed       Past Medical History:  Diagnosis Date  . Adenocarcinoma in situ of cervix   . Arthritis    hands  . Breast cancer ([place])   . Colon cancer ([place])   . Diabetes mellitus without complication ([place])   . Endometrial carcinoma (HCC)    s/p total abdominal hysterectomy  . H/O compression fracture of spine 2014   thoracic spine  . H/O polycythemia vera   . H/O TIA (transient ischemic attack) and stroke 09/2014, 03/2015   No deficits  . Hypertension   . Hyperuricemia   . Microalbuminuria   . Polycythemia vera ([place])   . Recurrent falls   . Skin cancer    face, legs  . Stroke [facility]) 2008   no deficits  . Trochanteric bursitis   . Varicose veins    treated    Past Surgical History:  Procedure Laterality Date  . ABDOMINAL HYSTERECTOMY    . BOWEL RESECTION N/A 03/28/2015   Procedure: SMALL BOWEL RESECTION;  Surgeon: Leonie Green, MD;  Location: [facility] ORS;  Service: General;  Laterality: N/A;  . CATARACT EXTRACTION W/ INTRAOCULAR LENS IMPLANT Right   . CATARACT EXTRACTION W/PHACO Left 10/07/2015   Procedure: CATARACT EXTRACTION PHACO AND INTRAOCULAR LENS PLACEMENT (IOC);  Surgeon: Ronnell Freshwater, MD;  Location: [place];  Service: Ophthalmology;  Laterality: Left;  DIABETIC - oral meds VISION BLUE  . CORONARY ANGIOGRAPHY N/A 10/29/2017   Procedure: CORONARY ANGIOGRAPHY;  Surgeon: Dionisio David, MD;  Location: [place] CV LAB;  Service: Cardiovascular;  Laterality: N/A;  . EXPLORATORY LAPAROTOMY     for fibroids  . LEFT HEART CATH Right 10/29/2017   Procedure: Left Heart Cath;  Surgeon: Dionisio David, MD;  Location: [place] CV LAB;  Service: Cardiovascular;  Laterality: Right;  . TONSILLECTOMY      There were no vitals filed for this visit.  Subjective Assessment - 07/05/18 1052    Subjective  Patient reports doing okay. "I cant write well today." She reports no new falls but has had near misses; She also has intermittent knee pain;     Pertinent History    85 year old female with adenocarcinoma of the cervix as well as endometrial carcinoma and polycythemia vera as well as diabetes who had hypertensive right cerebellar hemorrhage onset 10/28/2017.  She was treated initially at [facility] and then transferred to [facility].  Neurosurgical evaluation per Dr. Ronnald Ramp concluded no surgery was needed.  The patient completed inpatient rehabilitation at [facility] (including SLP) 11/04/2017-11/18/2107 and was discharged at  a 24-hour supervision level.  The patient has hired caregiver to provide 24-hour supervision. Patient has received home health rehab services, including SLP.    Limitations  Standing;Walking    How long can you walk comfortably?  a few minutes    Patient Stated Goals  Patient wants to walk better and not need the RW.     Currently in Pain?  Yes    Pain Score  5     Pain Location  Knee    Pain Orientation  Right    Pain Descriptors / Indicators  Aching;Sore    Pain Type  Chronic pain    Pain Onset  More than a month ago    Aggravating Factors   worse in the morning    Pain Relieving Factors  activity helps reduce the  discomfort    Effect of Pain on Daily Activities  decreased transfer/standing tolerance especially in the morning;            Warm up on Nustep, BUE/BLE level 2 x4 min (unbilled);  Patient instructed in advanced balance exercise  Gait outside in hallway:  Forward walking with head turns up/down, side/side x175fet each,  Forward/backward walking directional change x80 feet;  Required mod VCs to increase gait speed and increase ankle DF at heel strike especially on RLE for better foot clearance; Patient able to exhibit less instability with increased gait speed; in addition she was able to exhibit less instability with vertical head turns, being able to progress from min A to close supervision;Continues to require min A with lateral head turns;  Instructed patient in dynamic balance exercise: Resisted walking,12.5# forward/backward, side/side x4way, x3 laps each direction; required min A for safety and cues to improve weight shift especially with eccentric control for better balance control.Patient also required cues to increase step length, especially with forward/backward walking for reciprocal gait technique;   Weaving around cones #5 x4 sets Required supervision with cones wide apart, requiring mod VCs to improve erect posture and avoid looking down at floor; Required min A and exhibited loss of balance with cones closer together having difficulty with narrow base of support;  Stepping over small bolster #2 x4 sets with close supervision; Progressed to stepping over large bolster #2 x4 sets with min A for safety; patient exhibits less stability when stepping over larger bolster, often falling to right side;  Standing on airex: Feet together eyes open/closed 10 sec hold x2 sets each, no loss of balance; Alternate toe taps to 4inch step unsupported x15 reps bilaterally, min A; Standing one foot on airex, one foot on step with BUE ball pass side/side x5 reps each  foot on step, min A for safety;  Modified tandem stance, eyes open, head turns side/side, up/downx5 reps each; Patient required min VCs for correct exercise technique, exhibits no instability with advanced exercise;  Patient fatigued at end of session;Denies any increase in pain;                          PT Education - 07/05/18 1053    Education Details  exercise technique/balance/gait safety    Person(s) Educated  Patient    Methods  Explanation;Verbal cues    Comprehension  Verbalized understanding;Returned demonstration;Verbal cues required;Need further instruction       PT Short Term Goals - 06/01/18 1428      PT SHORT TERM GOAL #1   Title  Patient will reduce timed up and go to <11  seconds to reduce fall risk and demonstrate improved transfer/gait ability.    Baseline  22.07 sec; 03/24/18: 14.2sec; 04/25/18: 14.0 sec with RW, 06/01/18: 12.7s    Time  2    Period  Weeks    Status  Partially Met    Target Date  06/29/18      PT SHORT TERM GOAL #2   Title  Patient will be independent with ascend/descend 2 steps using single UE in step over step pattern without LOB.    Baseline  03/24/18: CGA, single UE support, step-to pattern without LOB; 04/25/18: CGA, single UE support, step-over-step pattern without LOB; 06/01/18: CGA, single UE support    Time  2    Period  Weeks    Status  On-going    Target Date  06/29/17        PT Long Term Goals - 06/01/18 1428      PT LONG TERM GOAL #1   Title  Patient will be independent in home exercise program to improve strength/mobility for better functional independence with ADLs.    Baseline  04/25/18: unsure but does not remember doing HEP lately; 06/01/18: Pt performs intermittently    Time  8    Period  Weeks    Status  On-going    Target Date  07/27/18      PT LONG TERM GOAL #2   Title  Patient (> 105 years old) will complete five times sit to stand test in < 15 seconds indicating an increased LE strength and  improved balance    Baseline  34.21 sec; 03/24/2018: 22.9 sec; 04/25/18: 18.5 sec with BUE support; 06/01/18: 15.5s with BUE support, still unable to perform STS without UE support    Time  8    Period  Weeks    Status  Partially Met    Target Date  07/27/18      PT LONG TERM GOAL #3   Title  Patient will increase six minute walk test distance to >1000 for progression to community ambulator and improve gait ability    Baseline  550 feet; 03/24/18: 795 ft.; 04/25/18: 975 ft with RW; 06/01/18: 1040' with RW    Time  8    Period  Weeks    Status  Achieved      PT LONG TERM GOAL #4   Title  Patient will increase 10 meter walk test to >1.54ms as to improve gait speed for better community ambulation and to reduce fall risk    Baseline  .49 m/sec; 03/24/18: 1.01 m/sec; 06/01/18: self-selected: 9.7s = 1.03 m/s, fastest: 8.0s = 1.25 m/s    Time  --    Period  Weeks    Status  Achieved      PT LONG TERM GOAL #5   Title  Pt will improve BERG by at least 3 points in order to demonstrate clinically significant improvement in balance.      Baseline  06/01/18: 44/56    Time  8    Period  Weeks    Status  New    Target Date  07/27/18            Plan - 07/05/18 1155    Clinical Impression Statement  Patient is progressing fair. She was able to exhibit less instability when walking with vertical head turns as compared to horizontal head turns. She does still get unsteady when turning or walking in narrow base of support. She would benefit from additional skilled PT Intervention to  improve strength, balance and gait safety;     PT Frequency  2x / week    PT Duration  8 weeks    PT Treatment/Interventions  Patient/family education;Neuromuscular re-education;Balance training;Stair training;Therapeutic activities;Therapeutic exercise;Gait training;Aquatic Therapy    PT Next Visit Plan  balance and strengthening    Consulted and Agree with Plan of Care  Patient       Patient will benefit from  skilled therapeutic intervention in order to improve the following deficits and impairments:  Abnormal gait, Decreased balance, Decreased endurance, Decreased mobility, Impaired flexibility, Decreased strength, Decreased knowledge of use of DME, Decreased activity tolerance, Difficulty walking  Visit Diagnosis: Muscle weakness (generalized)  Other lack of coordination  Difficulty in walking, not elsewhere classified     Problem List Patient Active Problem List   Diagnosis Date Noted  . Liver lesion 04/22/2018  . Goals of care, counseling/discussion 04/22/2018  . Cognitive deficit, post-stroke 12/31/2017  . Ataxia, post-stroke 12/31/2017  . Dysarthria, post-stroke   . Gait disturbance, post-stroke   . H/O cerebral parenchymal hemorrhage 11/04/2017  . Essential hypertension 11/04/2017  . Hyperlipidemia 11/04/2017  . Diabetes ([place]) 11/04/2017  . CAD (coronary artery disease) 11/04/2017  . 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 Hypokalemia 11/04/2017  . Right-sided nontraumatic intracerebral hemorrhage of cerebellum ([place])   . History of cervical cancer   . History of TIA (transient ischemic attack)   . Acute systolic congestive heart failure ([place])   . Reactive hypertension   . Hypernatremia   . Leukocytosis   . Acute blood loss anemia   . Elevated serum creatinine   . Acute respiratory failure with hypoxia ([place])   . IVH (intraventricular hemorrhage) ([place]) 10/29/2017  . Hypoxia 10/28/2017  . Pancreatic lesion 05/24/2017  . 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 Nodule of upper lobe of left lung 06/04/2015  . 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 Cerebral thrombosis with cerebral infarction 04/03/2015  . Cancer of right colon ([place]) 03/28/2015  . CVA (cerebral infarction) 12/21/2014  . Polycythemia vera ([place]) 06/22/2006    Trotter,Margaret PT, DPT 07/05/2018, 11:58 AM  [place] MAIN [facility]  SERVICES [address] [place], [place], [address] Phone: [phone]   Fax:  [phone]  Name: [name] MRN: [id number] Date of Birth: 1932/03/16

## 2018-07-05 NOTE — Therapy (Signed)
[place] MAIN [facility] SERVICES [address] [place], [place], [address] Phone: [phone]   Fax:  [phone]  Occupational Therapy Progress Note  Dates of reporting period  05/27/2019   to   07/05/2018  Patient Details  Name: [name] MRN: [id number] Date of Birth: 08-Oct-1931 No data recorded  Encounter Date: 07/05/2018  OT End of Session - 07/05/18 1011    Visit Number  30    Number of Visits  73    Date for OT Re-Evaluation  09/15/18    Authorization Type  Medicare    Authorization Time Period  Next progress report period starting 07/05/2018    OT Start Time  1003    OT Stop Time  1045    OT Time Calculation (min)  42 min    Activity Tolerance  Patient tolerated treatment well    Behavior During Therapy  [facility] for tasks assessed/performed       Past Medical History:  Diagnosis Date  . Adenocarcinoma in situ of cervix   . Arthritis    hands  . Breast cancer ([place])   . Colon cancer ([place])   . Diabetes mellitus without complication ([place])   . Endometrial carcinoma (HCC)    s/p total abdominal hysterectomy  . H/O compression fracture of spine 2014   thoracic spine  . H/O polycythemia vera   . H/O TIA (transient ischemic attack) and stroke 09/2014, 03/2015   No deficits  . Hypertension   . Hyperuricemia   . Microalbuminuria   . Polycythemia vera ([place])   . Recurrent falls   . Skin cancer    face, legs  . Stroke [facility]) 2008   no deficits  . Trochanteric bursitis   . Varicose veins    treated    Past Surgical History:  Procedure Laterality Date  . ABDOMINAL HYSTERECTOMY    . BOWEL RESECTION N/A 03/28/2015   Procedure: SMALL BOWEL RESECTION;  Surgeon: Leonie Green, MD;  Location: [facility] ORS;  Service: General;  Laterality: N/A;  . CATARACT EXTRACTION W/ INTRAOCULAR LENS IMPLANT Right   . CATARACT EXTRACTION W/PHACO Left 10/07/2015   Procedure: CATARACT EXTRACTION PHACO AND INTRAOCULAR LENS PLACEMENT (IOC);  Surgeon:  Ronnell Freshwater, MD;  Location: [place];  Service: Ophthalmology;  Laterality: Left;  DIABETIC - oral meds VISION BLUE  . CORONARY ANGIOGRAPHY N/A 10/29/2017   Procedure: CORONARY ANGIOGRAPHY;  Surgeon: Dionisio David, MD;  Location: [place] CV LAB;  Service: Cardiovascular;  Laterality: N/A;  . EXPLORATORY LAPAROTOMY     for fibroids  . LEFT HEART CATH Right 10/29/2017   Procedure: Left Heart Cath;  Surgeon: Dionisio David, MD;  Location: [place] CV LAB;  Service: Cardiovascular;  Laterality: Right;  . TONSILLECTOMY      There were no vitals filed for this visit.  Subjective Assessment - 07/05/18 1010    Subjective   Pt. reports that it is raining outside.    Pertinent History  85 year old female with adenocarcinoma of the cervix as well as endometrial carcinoma and polycythemia vera as well as diabetes who had hypertensive right cerebellar hemorrhage onset 10/28/2017.  She was treated initially at [facility] and then transferred to [facility].  Neurosurgical evaluation per Dr. Ronnald Ramp concluded no surgery was needed.  The patient completed inpatient rehabilitation at [facility] (including SLP) 11/04/2017-11/18/2107 and was discharged at a 24-hour supervision level.  The patient has hired caregiver to provide 24-hour supervision. Patient  has received home health rehab services, including SLP.     Patient Stated Goals  Patient would like to be independent in all tasks and be able to live alone again.    Currently in Pain?  No/denies      OT TREATMENT    Selfcare:  Pt. worked on Estate agent tasks, and Media planner listing 3 days of meals, and creating a shopping list based on the meals.  Pt. requires cues for spelling, spacing, and for memory. Pt. education was provided about memory strategies. Pt. wrote with 25% legibility for cursive form, and 50% legibility for printed form. Pt. Was able to maintain a mature grasp on the pen with a small  built up handle. Pt. required verbal cues, and assist for UE functioning.                         OT Education - 07/05/18 1011    Education Details  writing legibility    Person(s) Educated  Patient    Methods  Explanation;Demonstration;Verbal cues    Comprehension  Verbalized understanding;Returned demonstration;Verbal cues required          OT Long Term Goals - 07/05/18 1645      OT LONG TERM GOAL #1   Title  Patient will complete bathing with modified independence    Baseline  1/14/20120: Pt. continues to have difficulty with washing the bottom of her feet, however is able to complete it.    Time  12    Period  Weeks    Status  Partially Met    Target Date  09/15/18      OT LONG TERM GOAL #2   Title  Patient will complete dressing skills with modified independence    Baseline  07/05/2018: Pt. conitnues to require assist with hooking her bra. Pt. requires care giver assist secondary to requiring increased time to complete.    Time  12    Period  Weeks    Status  On-going    Target Date  09/15/18      OT LONG TERM GOAL #3   Title  Patient will complete light meal prep with min assist    Baseline  07/05/2018: Pt. is able to fix herself something light to eat, However requires assist formeal preparation.    Time  12    Period  Weeks    Status  On-going    Target Date  09/15/18      OT LONG TERM GOAL #4   Title  Patient will increase R grip strength by 5# to open jars and containers with modified independence    Baseline  07/05/2018: Grip is improving, however requires assist to open containers.    Time  12    Period  Weeks    Status  On-going    Target Date  09/15/18      OT LONG TERM GOAL #5   Title  Patient will improve strength by 1 mm grade RUE to assist with obtaining items from the closet.     Baseline  07/05/2018: Pt. has made progress, and has difficulty reaching for items in the closet.    Time  8    Period  Weeks    Status  On-going     Target Date  09/15/18      OT LONG TERM GOAL #6   Title  Pt will increase handwriting legibility and speed to 50% while writing a 3 sentence paragraph in  order to write thank you notes and birthday cards for friends and family    Baseline  07/05/2018: 50% writing lebility for one sentence with increased time.    Time  5    Period  Weeks    Status  On-going    Target Date  09/15/18            Plan - 07/05/18 1012    Clinical Impression Statement  Pt. reports having caregivers at home 24 hours a day, 7 days a week. Pt. in making steady progress. Pt. writing legibility is 25% for cursive writing, and 50% for print form. Pt. continues to work on improving [facility] skills, for improved writing tasks, ADLs, and IADL functioning. Goals were reviewed.    Occupational Profile and client history currently impacting functional performance  family more than 30 mins away, has 24 hour caregiver assist, decreased awareness, memory and safety    Occupational performance deficits (Please refer to evaluation for details):  ADL's;IADL's;Social Participation    Rehab Potential  Good    Current Impairments/barriers affecting progress:  memory, requires 24 hour caregivers,     OT Frequency  2x / week    OT Duration  12 weeks    OT Treatment/Interventions  Self-care/ADL training;Cryotherapy;Therapeutic exercise;DME and/or AE instruction;Functional Mobility Training;Cognitive remediation/compensation;Balance training;Neuromuscular education;Manual Therapy;Moist Heat;Therapeutic activities;Patient/family education    Clinical Decision Making  Several treatment options, min-mod task modification necessary    Consulted and Agree with Plan of Care  Patient    Family Member Consulted  Caregiver       Patient will benefit from skilled therapeutic intervention in order to improve the following deficits and impairments:  Decreased balance, Decreased mobility, Difficulty walking, Decreased cognition, Decreased activity  tolerance, Decreased coordination, Decreased strength, Impaired UE functional use  Visit Diagnosis: Muscle weakness (generalized)  Other lack of coordination    Problem List Patient Active Problem List   Diagnosis Date Noted  . Liver lesion 04/22/2018  . Goals of care, counseling/discussion 04/22/2018  . Cognitive deficit, post-stroke 12/31/2017  . Ataxia, post-stroke 12/31/2017  . Dysarthria, post-stroke   . Gait disturbance, post-stroke   . H/O cerebral parenchymal hemorrhage 11/04/2017  . Essential hypertension 11/04/2017  . Hyperlipidemia 11/04/2017  . Diabetes ([place]) 11/04/2017  . CAD (coronary artery disease) 11/04/2017  . 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 Hypokalemia 11/04/2017  . Right-sided nontraumatic intracerebral hemorrhage of cerebellum ([place])   . History of cervical cancer   . History of TIA (transient ischemic attack)   . Acute systolic congestive heart failure ([place])   . Reactive hypertension   . Hypernatremia   . Leukocytosis   . Acute blood loss anemia   . Elevated serum creatinine   . Acute respiratory failure with hypoxia ([place])   . IVH (intraventricular hemorrhage) ([place]) 10/29/2017  . Hypoxia 10/28/2017  . Pancreatic lesion 05/24/2017  . 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 Nodule of upper lobe of left lung 06/04/2015  . 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 Cerebral thrombosis with cerebral infarction 04/03/2015  . Cancer of right colon ([place]) 03/28/2015  . CVA (cerebral infarction) 12/21/2014  . Polycythemia vera ([place]) 06/22/2006    Harrel Carina, MS, OTR/L 07/05/2018, 4:49 PM  [place] MAIN [facility] SERVICES [address] [place], [place], [address] Phone: [phone]   Fax:  [phone]  Name: ZULEMA PULASKI MRN: [id number] Date of Birth: Jan 10, 1932

## 2018-07-07 ENCOUNTER — Ambulatory Visit: Payer: Medicare Other | Admitting: Occupational Therapy

## 2018-07-07 ENCOUNTER — Encounter: Payer: Self-pay | Admitting: Physical Therapy

## 2018-07-07 ENCOUNTER — Encounter: Payer: Self-pay | Admitting: Occupational Therapy

## 2018-07-07 ENCOUNTER — Ambulatory Visit: Payer: Medicare Other | Admitting: Physical Therapy

## 2018-07-07 DIAGNOSIS — R278 Other lack of coordination: Secondary | ICD-10-CM

## 2018-07-07 DIAGNOSIS — M6281 Muscle weakness (generalized): Secondary | ICD-10-CM

## 2018-07-07 DIAGNOSIS — R262 Difficulty in walking, not elsewhere classified: Secondary | ICD-10-CM

## 2018-07-07 NOTE — Therapy (Signed)
[place] MAIN [facility] SERVICES [address] [place], [place], [address] Phone: [phone]   Fax:  [phone]  Physical Therapy Treatment  Patient Details  Name: [name] MRN: [id number] Date of Birth: [birth year] Referring Provider (PT): Charlett Blake   Encounter Date: 07/07/2018  PT End of Session - 07/07/18 1300    Visit Number  28    Number of Visits  41    Date for PT Re-Evaluation  07/27/18    Authorization Type  8/10, start of care 01/31/18, last goals 62/70/35 (recert)    PT Start Time  1102    PT Stop Time  1145    PT Time Calculation (min)  43 min    Equipment Utilized During Treatment  Gait belt    Activity Tolerance  Patient tolerated treatment well    Behavior During Therapy  WFL for tasks assessed/performed       Past Medical History:  Diagnosis Date  . Adenocarcinoma in situ of cervix   . Arthritis    hands  . Breast cancer ([place])   . Colon cancer ([place])   . Diabetes mellitus without complication ([place])   . Endometrial carcinoma (HCC)    s/p total abdominal hysterectomy  . H/O compression fracture of spine 2014   thoracic spine  . H/O polycythemia vera   . H/O TIA (transient ischemic attack) and stroke 09/2014, 03/2015   No deficits  . Hypertension   . Hyperuricemia   . Microalbuminuria   . Polycythemia vera ([place])   . Recurrent falls   . Skin cancer    face, legs  . Stroke [facility]) 2008   no deficits  . Trochanteric bursitis   . Varicose veins    treated    Past Surgical History:  Procedure Laterality Date  . ABDOMINAL HYSTERECTOMY    . BOWEL RESECTION N/A 03/28/2015   Procedure: SMALL BOWEL RESECTION;  Surgeon: Leonie Green, MD;  Location: [facility] ORS;  Service: General;  Laterality: N/A;  . CATARACT EXTRACTION W/ INTRAOCULAR LENS IMPLANT Right   . CATARACT EXTRACTION W/PHACO Left 10/07/2015   Procedure: CATARACT EXTRACTION PHACO AND INTRAOCULAR LENS PLACEMENT (IOC);  Surgeon: Ronnell Freshwater, MD;  Location: [place];  Service: Ophthalmology;  Laterality: Left;  DIABETIC - oral meds VISION BLUE  . CORONARY ANGIOGRAPHY N/A 10/29/2017   Procedure: CORONARY ANGIOGRAPHY;  Surgeon: Dionisio David, MD;  Location: [place] CV LAB;  Service: Cardiovascular;  Laterality: N/A;  . EXPLORATORY LAPAROTOMY     for fibroids  . LEFT HEART CATH Right 10/29/2017   Procedure: Left Heart Cath;  Surgeon: Dionisio David, MD;  Location: [place] CV LAB;  Service: Cardiovascular;  Laterality: Right;  . TONSILLECTOMY      There were no vitals filed for this visit.  Subjective Assessment - 07/07/18 1259    Subjective  Patient reports doing well. She reports, "I feel that I am making good progress with PT but my handwriting is still so limited."     Pertinent History    85 year old female with adenocarcinoma of the cervix as well as endometrial carcinoma and polycythemia vera as well as diabetes who had hypertensive right cerebellar hemorrhage onset 10/28/2017.  She was treated initially at [facility] and then transferred to [facility].  Neurosurgical evaluation per Dr. Ronnald Ramp concluded no surgery was needed.  The patient completed inpatient rehabilitation at [facility] (including SLP) 11/04/2017-11/18/2107 and was discharged at a 24-hour  supervision level.  The patient has hired caregiver to provide 24-hour supervision. Patient has received home health rehab services, including SLP.    Limitations  Standing;Walking    How long can you walk comfortably?  a few minutes    Patient Stated Goals  Patient wants to walk better and not need the RW.     Currently in Pain?  No/denies    Multiple Pain Sites  No         Warm up on Nustep, BUE/BLE level 2 x4 min (unbilled);  Gait training: Patient ambulated in hallway and outside on level/unlevel surfaces on concrete x1000 feet; required supervision, ambulated without her AD with occasional right foot drag. However  with verbal cues she was able to pick her right foot up easier;  Patient negotiated a 4 inch curb, unsupported without AD, close supervision x3 sets with min VCs for safety when turning prior to stepping up for less instability; With increased repetition she was able to exhibit better safety  Awareness.  Patient able to walk longer distance and on level/unlevel surfaces without AD with supervision; educated patient and caregiver that she can start walking short distances without her AD at home.   Patient instructed in advanced balance exercise   Instructed patient in dynamic balance exercise: Resisted walking,12.5# forward/backward, side/side x4way, x3 laps each direction; required min A for safety and cues to improve weight shift especially with eccentric control for better balance control.Patient also required cues to increase step length, especially with forward/backward walking for reciprocal gait technique;   Weaving around cones #5 x4sets Required supervision with cones wide apart, requiring mod VCs to improve erect posture and avoid looking down at floor; Required min A and exhibited loss of balance with cones closer together having difficulty with narrow base of support;  Stepping over large bolster #2 x4 sets with min A for safety; patient exhibits less stability when stepping over larger bolster, often falling to right side;  Standing on airex: Feet together eyes open/closed 10 sec hold x3 sets each, no loss of balance; Modified tandem stance, eyes open, head turns side/side, up/downx10 reps each; Patient required min VCs for correct exercise technique, exhibits no instability with advanced exercise;  Standing on 1/2 bolster: Ankle DF/PF rocks x15 -feet apart, BUE wand flexion x10 reps -tandem stance 10 sec hold x3 sets each foot in front;  Patient required min VCs to improve ankle strategies and weight shift for improved stance control;   Patient  fatigued at end of session;Denies any increase in pain;                         PT Education - 07/07/18 1259    Education Details  exercise technique/balance and gait safety    Person(s) Educated  Patient    Methods  Explanation;Verbal cues    Comprehension  Verbalized understanding;Returned demonstration;Verbal cues required;Need further instruction       PT Short Term Goals - 06/01/18 1428      PT SHORT TERM GOAL #1   Title  Patient will reduce timed up and go to <11 seconds to reduce fall risk and demonstrate improved transfer/gait ability.    Baseline  22.07 sec; 03/24/18: 14.2sec; 04/25/18: 14.0 sec with RW, 06/01/18: 12.7s    Time  2    Period  Weeks    Status  Partially Met    Target Date  06/29/18      PT SHORT TERM GOAL #2  Title  Patient will be independent with ascend/descend 2 steps using single UE in step over step pattern without LOB.    Baseline  03/24/18: CGA, single UE support, step-to pattern without LOB; 04/25/18: CGA, single UE support, step-over-step pattern without LOB; 06/01/18: CGA, single UE support    Time  2    Period  Weeks    Status  On-going    Target Date  06/29/17        PT Long Term Goals - 06/01/18 1428      PT LONG TERM GOAL #1   Title  Patient will be independent in home exercise program to improve strength/mobility for better functional independence with ADLs.    Baseline  04/25/18: unsure but does not remember doing HEP lately; 06/01/18: Pt performs intermittently    Time  8    Period  Weeks    Status  On-going    Target Date  07/27/18      PT LONG TERM GOAL #2   Title  Patient (> 57 years old) will complete five times sit to stand test in < 15 seconds indicating an increased LE strength and improved balance    Baseline  34.21 sec; 03/24/2018: 22.9 sec; 04/25/18: 18.5 sec with BUE support; 06/01/18: 15.5s with BUE support, still unable to perform STS without UE support    Time  8    Period  Weeks    Status   Partially Met    Target Date  07/27/18      PT LONG TERM GOAL #3   Title  Patient will increase six minute walk test distance to >1000 for progression to community ambulator and improve gait ability    Baseline  550 feet; 03/24/18: 795 ft.; 04/25/18: 975 ft with RW; 06/01/18: 1040' with RW    Time  8    Period  Weeks    Status  Achieved      PT LONG TERM GOAL #4   Title  Patient will increase 10 meter walk test to >1.16ms as to improve gait speed for better community ambulation and to reduce fall risk    Baseline  .49 m/sec; 03/24/18: 1.01 m/sec; 06/01/18: self-selected: 9.7s = 1.03 m/s, fastest: 8.0s = 1.25 m/s    Time  --    Period  Weeks    Status  Achieved      PT LONG TERM GOAL #5   Title  Pt will improve BERG by at least 3 points in order to demonstrate clinically significant improvement in balance.      Baseline  06/01/18: 44/56    Time  8    Period  Weeks    Status  New    Target Date  07/27/18            Plan - 07/07/18 1300    Clinical Impression Statement  Patient is progressing well. She was able to walk outside on uneven surfaces without AD with supervision. Patient exhibited better safety awareness negotiating obstacles with less weaving and less loss of balance. Patient and caregiver educated that patient is safe to walk short distances without AD as long as she has someone nearby for supervision. Patient encouraged to continue using RW when walking alone or at night or when dizzy. She would benefit from additional skilled PT intervention to improve strength, balance and gait safety;     PT Frequency  2x / week    PT Duration  8 weeks    PT Treatment/Interventions  Patient/family  education;Neuromuscular re-education;Balance training;Stair training;Therapeutic activities;Therapeutic exercise;Gait training;Aquatic Therapy    PT Next Visit Plan  balance and strengthening    Consulted and Agree with Plan of Care  Patient       Patient will benefit from skilled  therapeutic intervention in order to improve the following deficits and impairments:  Abnormal gait, Decreased balance, Decreased endurance, Decreased mobility, Impaired flexibility, Decreased strength, Decreased knowledge of use of DME, Decreased activity tolerance, Difficulty walking  Visit Diagnosis: Muscle weakness (generalized)  Other lack of coordination  Difficulty in walking, not elsewhere classified     Problem List Patient Active Problem List   Diagnosis Date Noted  . Liver lesion 04/22/2018  . Goals of care, counseling/discussion 04/22/2018  . Cognitive deficit, post-stroke 12/31/2017  . Ataxia, post-stroke 12/31/2017  . Dysarthria, post-stroke   . Gait disturbance, post-stroke   . H/O cerebral parenchymal hemorrhage 11/04/2017  . Essential hypertension 11/04/2017  . Hyperlipidemia 11/04/2017  . Diabetes ([place]) 11/04/2017  . CAD (coronary artery disease) 11/04/2017  . 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 Hypokalemia 11/04/2017  . Right-sided nontraumatic intracerebral hemorrhage of cerebellum ([place])   . History of cervical cancer   . History of TIA (transient ischemic attack)   . Acute systolic congestive heart failure ([place])   . Reactive hypertension   . Hypernatremia   . Leukocytosis   . Acute blood loss anemia   . Elevated serum creatinine   . Acute respiratory failure with hypoxia ([place])   . IVH (intraventricular hemorrhage) ([place]) 10/29/2017  . Hypoxia 10/28/2017  . Pancreatic lesion 05/24/2017  . 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 Nodule of upper lobe of left lung 06/04/2015  . 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 Cerebral thrombosis with cerebral infarction 04/03/2015  . Cancer of right colon ([place]) 03/28/2015  . CVA (cerebral infarction) 12/21/2014  . Polycythemia vera ([place]) 06/22/2006    Adelei Scobey PT, DPT 07/07/2018, 1:06 PM  [place] MAIN [facility] SERVICES [address] [place], [place], [address] Phone: [phone]   Fax:  [phone]  Name: SCOTTI MOTTER MRN: [id number] Date of Birth: 1932/03/04

## 2018-07-07 NOTE — Therapy (Signed)
[place] MAIN [facility] SERVICES [address] [place], [place], [address] Phone: [phone]   Fax:  [phone]  Occupational Therapy Treatment  Patient Details  Name: [name] MRN: [id number] Date of Birth: 1931-09-08 No data recorded  Encounter Date: 07/07/2018  OT End of Session - 07/07/18 1156    Visit Number  31    Number of Visits  66    Date for OT Re-Evaluation  09/15/18    Authorization Type  Medicare    Authorization Time Period  Next progress report period starting 07/05/2018    OT Start Time  1145    OT Stop Time  1230    OT Time Calculation (min)  45 min    Activity Tolerance  Patient tolerated treatment well    Behavior During Therapy  [facility] for tasks assessed/performed       Past Medical History:  Diagnosis Date  . Adenocarcinoma in situ of cervix   . Arthritis    hands  . Breast cancer ([place])   . Colon cancer ([place])   . Diabetes mellitus without complication ([place])   . Endometrial carcinoma (HCC)    s/p total abdominal hysterectomy  . H/O compression fracture of spine 2014   thoracic spine  . H/O polycythemia vera   . H/O TIA (transient ischemic attack) and stroke 09/2014, 03/2015   No deficits  . Hypertension   . Hyperuricemia   . Microalbuminuria   . Polycythemia vera ([place])   . Recurrent falls   . Skin cancer    face, legs  . Stroke [facility]) 2008   no deficits  . Trochanteric bursitis   . Varicose veins    treated    Past Surgical History:  Procedure Laterality Date  . ABDOMINAL HYSTERECTOMY    . BOWEL RESECTION N/A 03/28/2015   Procedure: SMALL BOWEL RESECTION;  Surgeon: Leonie Green, MD;  Location: [facility] ORS;  Service: General;  Laterality: N/A;  . CATARACT EXTRACTION W/ INTRAOCULAR LENS IMPLANT Right   . CATARACT EXTRACTION W/PHACO Left 10/07/2015   Procedure: CATARACT EXTRACTION PHACO AND INTRAOCULAR LENS PLACEMENT (IOC);  Surgeon: Ronnell Freshwater, MD;  Location: [place];   Service: Ophthalmology;  Laterality: Left;  DIABETIC - oral meds VISION BLUE  . CORONARY ANGIOGRAPHY N/A 10/29/2017   Procedure: CORONARY ANGIOGRAPHY;  Surgeon: Dionisio David, MD;  Location: [place] CV LAB;  Service: Cardiovascular;  Laterality: N/A;  . EXPLORATORY LAPAROTOMY     for fibroids  . LEFT HEART CATH Right 10/29/2017   Procedure: Left Heart Cath;  Surgeon: Dionisio David, MD;  Location: [place] CV LAB;  Service: Cardiovascular;  Laterality: Right;  . TONSILLECTOMY      There were no vitals filed for this visit.  Subjective Assessment - 07/07/18 1154    Subjective   Pt. having an eye appointment next month.    Pertinent History  85 year old female with adenocarcinoma of the cervix as well as endometrial carcinoma and polycythemia vera as well as diabetes who had hypertensive right cerebellar hemorrhage onset 10/28/2017.  She was treated initially at [facility] and then transferred to [facility].  Neurosurgical evaluation per Dr. Ronnald Ramp concluded no surgery was needed.  The patient completed inpatient rehabilitation at [facility] (including SLP) 11/04/2017-11/18/2107 and was discharged at a 24-hour supervision level.  The patient has hired caregiver to provide 24-hour supervision. Patient has received home health rehab services, including SLP.     Patient Stated  Goals  Patient would like to be independent in all tasks and be able to live alone again.    Currently in Pain?  No/denies      OT TREATMENT    Selfcare:  Pt. worked on Theatre stage manager tasks. Pt. requires increased time to complete the bill management questions. Pt. Writing legibility was 50% in print form. Pt. required cues to navigate the more complex monthly bills.                         OT Education - 07/07/18 1156    Education Details  writing legibility    Person(s) Educated  Patient    Methods  Explanation;Demonstration;Verbal cues     Comprehension  Verbalized understanding;Returned demonstration;Verbal cues required          OT Long Term Goals - 07/05/18 1645      OT LONG TERM GOAL #1   Title  Patient will complete bathing with modified independence    Baseline  1/14/20120: Pt. continues to have difficulty with washing the bottom of her feet, however is able to complete it.    Time  12    Period  Weeks    Status  Partially Met    Target Date  09/15/18      OT LONG TERM GOAL #2   Title  Patient will complete dressing skills with modified independence    Baseline  07/05/2018: Pt. conitnues to require assist with hooking her bra. Pt. requires care giver assist secondary to requiring increased time to complete.    Time  12    Period  Weeks    Status  On-going    Target Date  09/15/18      OT LONG TERM GOAL #3   Title  Patient will complete light meal prep with min assist    Baseline  07/05/2018: Pt. is able to fix herself something light to eat, However requires assist formeal preparation.    Time  12    Period  Weeks    Status  On-going    Target Date  09/15/18      OT LONG TERM GOAL #4   Title  Patient will increase R grip strength by 5# to open jars and containers with modified independence    Baseline  07/05/2018: Grip is improving, however requires assist to open containers.    Time  12    Period  Weeks    Status  On-going    Target Date  09/15/18      OT LONG TERM GOAL #5   Title  Patient will improve strength by 1 mm grade RUE to assist with obtaining items from the closet.     Baseline  07/05/2018: Pt. has made progress, and has difficulty reaching for items in the closet.    Time  8    Period  Weeks    Status  On-going    Target Date  09/15/18      OT LONG TERM GOAL #6   Title  Pt will increase handwriting legibility and speed to 50% while writing a 3 sentence paragraph in order to write thank you notes and birthday cards for friends and family    Baseline  07/05/2018: 50% writing lebility for  one sentence with increased time.    Time  5    Period  Weeks    Status  On-going    Target Date  09/15/18  Plan - 07/07/18 1159    Clinical Impression Statement  Pt. is making progress overall. Pt. does have difficulty writing cards, and checks legibly, and efficiently, and reaching up to acess items in her closest, and cabinets. Pt. continues to work on improving RUE strength, and [facility] skills in order to improve ADL, and IADL performance skills.     Occupational Profile and client history currently impacting functional performance  family more than 30 mins away, has 24 hour caregiver assist, decreased awareness, memory and safety    Occupational performance deficits (Please refer to evaluation for details):  ADL's;IADL's;Social Participation    Rehab Potential  Good    Current Impairments/barriers affecting progress:  memory, requires 24 hour caregivers,     OT Frequency  2x / week    OT Duration  12 weeks    OT Treatment/Interventions  Self-care/ADL training;Cryotherapy;Therapeutic exercise;DME and/or AE instruction;Functional Mobility Training;Cognitive remediation/compensation;Balance training;Neuromuscular education;Manual Therapy;Moist Heat;Therapeutic activities;Patient/family education    Clinical Decision Making  Several treatment options, min-mod task modification necessary    Consulted and Agree with Plan of Care  Patient    Family Member Consulted  Caregiver       Patient will benefit from skilled therapeutic intervention in order to improve the following deficits and impairments:  Decreased balance, Decreased mobility, Difficulty walking, Decreased cognition, Decreased activity tolerance, Decreased coordination, Decreased strength, Impaired UE functional use  Visit Diagnosis: Muscle weakness (generalized)  Other lack of coordination    Problem List Patient Active Problem List   Diagnosis Date Noted  . Liver lesion 04/22/2018  . Goals of care,  counseling/discussion 04/22/2018  . Cognitive deficit, post-stroke 12/31/2017  . Ataxia, post-stroke 12/31/2017  . Dysarthria, post-stroke   . Gait disturbance, post-stroke   . H/O cerebral parenchymal hemorrhage 11/04/2017  . Essential hypertension 11/04/2017  . Hyperlipidemia 11/04/2017  . Diabetes ([place]) 11/04/2017  . CAD (coronary artery disease) 11/04/2017  . 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 Hypokalemia 11/04/2017  . Right-sided nontraumatic intracerebral hemorrhage of cerebellum ([place])   . History of cervical cancer   . History of TIA (transient ischemic attack)   . Acute systolic congestive heart failure ([place])   . Reactive hypertension   . Hypernatremia   . Leukocytosis   . Acute blood loss anemia   . Elevated serum creatinine   . Acute respiratory failure with hypoxia ([place])   . IVH (intraventricular hemorrhage) ([place]) 10/29/2017  . Hypoxia 10/28/2017  . Pancreatic lesion 05/24/2017  . 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 Nodule of upper lobe of left lung 06/04/2015  . 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 Cerebral thrombosis with cerebral infarction 04/03/2015  . Cancer of right colon ([place]) 03/28/2015  . CVA (cerebral infarction) 12/21/2014  . Polycythemia vera ([place]) 06/22/2006    Harrel Carina, MS,  OTR/L 07/07/2018, 12:14 PM  [place] MAIN [facility] SERVICES [address] [place], [place], [address] Phone: [phone]   Fax:  [phone]  Name: [name] MRN: [id number] Date of Birth: 26-Dec-1931

## 2018-07-08 ENCOUNTER — Encounter: Payer: Self-pay | Admitting: Hematology and Oncology

## 2018-07-08 ENCOUNTER — Inpatient Hospital Stay: Payer: Medicare Other | Attending: Hematology and Oncology

## 2018-07-08 ENCOUNTER — Other Ambulatory Visit: Payer: Self-pay | Admitting: Hematology and Oncology

## 2018-07-08 ENCOUNTER — Inpatient Hospital Stay: Payer: Medicare Other

## 2018-07-08 ENCOUNTER — Inpatient Hospital Stay (HOSPITAL_BASED_OUTPATIENT_CLINIC_OR_DEPARTMENT_OTHER): Payer: Medicare Other | Admitting: Hematology and Oncology

## 2018-07-08 VITALS — BP 142/84 | HR 72

## 2018-07-08 VITALS — BP 154/84 | HR 59 | Temp 97.2°F | Resp 18 | Ht 63.0 in | Wt 106.6 lb

## 2018-07-08 DIAGNOSIS — D45 Polycythemia vera: Secondary | ICD-10-CM

## 2018-07-08 DIAGNOSIS — D3A090 Benign carcinoid tumor of the bronchus and lung: Secondary | ICD-10-CM | POA: Insufficient documentation

## 2018-07-08 DIAGNOSIS — Z87891 Personal history of nicotine dependence: Secondary | ICD-10-CM

## 2018-07-08 DIAGNOSIS — D3A029 Benign carcinoid tumor of the large intestine, unspecified portion: Secondary | ICD-10-CM

## 2018-07-08 DIAGNOSIS — K769 Liver disease, unspecified: Secondary | ICD-10-CM | POA: Diagnosis not present

## 2018-07-08 DIAGNOSIS — Z79899 Other long term (current) drug therapy: Secondary | ICD-10-CM | POA: Insufficient documentation

## 2018-07-08 DIAGNOSIS — K869 Disease of pancreas, unspecified: Secondary | ICD-10-CM | POA: Insufficient documentation

## 2018-07-08 DIAGNOSIS — Z8673 Personal history of transient ischemic attack (TIA), and cerebral infarction without residual deficits: Secondary | ICD-10-CM | POA: Insufficient documentation

## 2018-07-08 DIAGNOSIS — Z7189 Other specified counseling: Secondary | ICD-10-CM

## 2018-07-08 LAB — CBC WITH DIFFERENTIAL/PLATELET
Abs Immature Granulocytes: 0.05 10*3/uL (ref 0.00–0.07)
Basophils Absolute: 0.1 10*3/uL (ref 0.0–0.1)
Basophils Relative: 2 %
Eosinophils Absolute: 0.2 10*3/uL (ref 0.0–0.5)
Eosinophils Relative: 3 %
HCT: 42.9 % (ref 36.0–46.0)
Hemoglobin: 13.2 g/dL (ref 12.0–15.0)
Immature Granulocytes: 1 %
Lymphocytes Relative: 12 %
Lymphs Abs: 1 10*3/uL (ref 0.7–4.0)
MCH: 30.4 pg (ref 26.0–34.0)
MCHC: 30.8 g/dL (ref 30.0–36.0)
MCV: 98.8 fL (ref 80.0–100.0)
Monocytes Absolute: 0.6 10*3/uL (ref 0.1–1.0)
Monocytes Relative: 7 %
Neutro Abs: 6.5 10*3/uL (ref 1.7–7.7)
Neutrophils Relative %: 75 %
Platelets: 391 10*3/uL (ref 150–400)
RBC: 4.34 MIL/uL (ref 3.87–5.11)
RDW: 15.8 % — ABNORMAL HIGH (ref 11.5–15.5)
WBC: 8.5 10*3/uL (ref 4.0–10.5)
nRBC: 0 % (ref 0.0–0.2)

## 2018-07-08 LAB — COMPREHENSIVE METABOLIC PANEL
ALT: 13 U/L (ref 0–44)
AST: 15 U/L (ref 15–41)
Albumin: 3.9 g/dL (ref 3.5–5.0)
Alkaline Phosphatase: 34 U/L — ABNORMAL LOW (ref 38–126)
Anion gap: 8 (ref 5–15)
BUN: 29 mg/dL — ABNORMAL HIGH (ref 8–23)
CO2: 27 mmol/L (ref 22–32)
Calcium: 9.2 mg/dL (ref 8.9–10.3)
Chloride: 106 mmol/L (ref 98–111)
Creatinine, Ser: 1.05 mg/dL — ABNORMAL HIGH (ref 0.44–1.00)
GFR calc Af Amer: 56 mL/min — ABNORMAL LOW (ref >60)
GFR calc non Af Amer: 49 mL/min — ABNORMAL LOW (ref >60)
Glucose, Bld: 120 mg/dL — ABNORMAL HIGH (ref 70–99)
Potassium: 4.4 mmol/L (ref 3.5–5.1)
Sodium: 141 mmol/L (ref 135–145)
Total Bilirubin: 0.8 mg/dL (ref 0.3–1.2)
Total Protein: 6.4 g/dL — ABNORMAL LOW (ref 6.5–8.1)

## 2018-07-08 NOTE — Progress Notes (Signed)
[facility] day:  07/08/2018  Chief Complaint: [name] is a 85 y.o. 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  HPI:  The patient was last seen in the medical oncology clinic on 05/06/2018.  At that time, she continued to do well s/p recent NSTEMI and cerebellar ICH. She required assistance with her ADLs. Exam is stable.  PET scan revealed no focal hypermetabolic uptake in the region of the hepatic lesions seen on recent abdominal MRI. The 9 mm left upper lobe pulmonary nodule was stable since at least 05/30/2015 and showed no hypermetabolism.  Follow-up imaging was discussed.  Abdominal MRI on 07/04/2018 revealed slight interval growth of several of the previously noted hepatic lesions, which all demonstrate diffusion restriction. The largest lesion in segment 5 was 2.2 x 1.6 cm (previously 1.9 x 1.3 cm), lesion in segment 4 B was 1.7 x 1.5 cm (previously 1.5 x 1.3 cm), lesion in segment 5 was 1.2 x 1.1 cm (previously 1.0 x 0.9 cm), and lesion in segment 8 was 1.3 x 0.9 cm (previously 1.0 x 0.8 cm).  These were concerning for neoplasm and warrant further evaluation. These could be definitively characterized with MRI of the abdomen with and without IV gadolinium. Based on the patient's remote lab from 04/21/2018 with creatinine of 1.11 and GFR of 44 ml/min, she would be a candidate for low-dose gadolinium, however, repeat metabolic panel would be recommended. Alternatively, biopsy of 1 of these lesions could be considered.  There was a 1.7 x 1.2 cm cystic appearing lesion in the tail of the pancreas, stable compared to the prior examinations dating back to 2016, considered benign.   During the interim, patient is doing well. She denies any acute symptoms. No chest pain or shortness of breath. Balance  and coordination has improved "a little bit". She has chronic speech deficits following a previous CVA. Patient denies that she has experienced any B symptoms. She denies any interval infections.   Patient advises that she maintains an adequate appetite. She is eating well. Weight today is 106 lb 9.6 oz (48.4 kg), which compared to her last visit to the clinic, represents a 3 pound increase.    Patient denies pain in the clinic today.   Past Medical History:  Diagnosis Date  . Adenocarcinoma in situ of cervix   . Arthritis    hands  . Breast cancer ([place])   . Colon cancer ([place])   . Diabetes mellitus without complication ([place])   . Endometrial carcinoma ([facility])    s/p total abdominal hysterectomy  . H/O compression fracture of spine 2014   thoracic spine  . H/O polycythemia vera   . H/O TIA (transient ischemic attack) and stroke 09/2014, 03/2015   No deficits  . Hypertension   . Hyperuricemia   . Microalbuminuria   . Polycythemia vera ([place])   . Recurrent falls   . Skin cancer    face, legs  . Stroke [facility]) 2008   no deficits  . Trochanteric bursitis   . Varicose veins    treated    Past Surgical History:  Procedure Laterality Date  . ABDOMINAL HYSTERECTOMY    . BOWEL RESECTION N/A 03/28/2015   Procedure: SMALL BOWEL RESECTION;  Surgeon: Leonie Green, MD;  Location: [facility] ORS;  Service: General;  Laterality: N/A;  .  CATARACT EXTRACTION W/ INTRAOCULAR LENS IMPLANT Right   . CATARACT EXTRACTION W/PHACO Left 10/07/2015   Procedure: CATARACT EXTRACTION PHACO AND INTRAOCULAR LENS PLACEMENT (IOC);  Surgeon: Ronnell Freshwater, MD;  Location: [facility];  Service: Ophthalmology;  Laterality: Left;  DIABETIC - oral meds VISION BLUE  . CORONARY ANGIOGRAPHY N/A 10/29/2017   Procedure: CORONARY ANGIOGRAPHY;  Surgeon: Dionisio David, MD;  Location: [facility];  Service: Cardiovascular;  Laterality: N/A;  . EXPLORATORY LAPAROTOMY     for fibroids  . LEFT HEART  CATH Right 10/29/2017   Procedure: Left Heart Cath;  Surgeon: Dionisio David, MD;  Location: [facility];  Service: Cardiovascular;  Laterality: Right;  . TONSILLECTOMY      Family History  Problem Relation Age of Onset  . Cancer Brother        AML  . Heart attack Mother   . Breast cancer Mother   . Heart attack Father   . Heart attack Sister   . Diabetes Sister   Her brother died of AML.  Social History:  reports that she has quit smoking. She has never used smokeless tobacco. She reports that she does not drink alcohol or use drugs.  The patient's husband died of AML.  She has no family in [place]. 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  Her niece is [name].  The patient is accompanied by her daughter today.  Allergies:  Allergies  Allergen Reactions  . Simvastatin     Other reaction(s): Muscle Pain    Current Medications: Current Outpatient Medications  Medication Sig Dispense Refill  . amLODipine (NORVASC) 2.5 MG tablet Take 1 tablet (2.5 mg total) by mouth daily. 30 tablet 0  . calcium-vitamin D (OSCAL WITH D) 500-200 MG-UNIT per tablet Take 1 tablet by mouth daily.     . carvedilol (COREG) 25 MG tablet Take 1 tablet (25 mg total) by mouth 2 (two) times daily with a meal. 60 tablet 0  . clopidogrel (PLAVIX) 75 MG tablet Take 1 tablet (75 mg total) by mouth daily. 90 tablet 4  . fexofenadine (ALLEGRA ALLERGY) 180 MG tablet Take 1 tablet (180 mg total) by mouth daily.    . hydroxyurea (HYDREA) 500 MG capsule Take 1 tablet daily except on Saturdays 30 capsule 0  . insulin detemir (LEVEMIR) 100 unit/ml SOLN Inject 0.08 mLs (8 Units total) into the skin at bedtime. (Patient taking differently: Inject 10 Units into the skin at bedtime. ) 8 mL 1  . nitroGLYCERIN (NITRODUR - DOSED IN MG/24 HR) 0.2 mg/hr patch Place 1 patch (0.2 mg total) onto the skin daily. 30 patch 12  . pantoprazole (PROTONIX) 40 MG tablet Take 1  tablet (40 mg total) by mouth daily. 30 tablet 0  . pravastatin (PRAVACHOL) 20 MG tablet Take 1 tablet (20 mg total) by mouth at bedtime. 30 tablet 0  . Probiotic Product (PROBIOTIC DAILY PO) Take 1 tablet by mouth daily.    . ramipril (ALTACE) 5 MG capsule Take 5 mg by mouth 2 (two) times daily.    . sertraline (ZOLOFT) 25 MG tablet Take 25 mg by mouth daily.    Marland Kitchen spironolactone (ALDACTONE) 25 MG tablet Take 1 tablet (25 mg total) by mouth daily. (Patient taking differently: Take 12.5 mg by mouth daily. ) 30 tablet 0  . blood glucose meter kit and supplies Dispense based on patient and insurance preference. Use up to  four times daily as directed. (FOR ICD-10 E10.9, E11.9). (Patient not taking: Reported on 07/08/2018) 1 each 0  . docusate sodium (COLACE) 250 MG capsule Take 250 mg by mouth daily as needed for constipation.     No current facility-administered medications for this visit.     Review of Systems  Constitutional: Negative.  Negative for chills, diaphoresis, fever, malaise/fatigue and weight loss (up 3 pounds).       Feels "pretty good".  HENT: Negative.  Negative for congestion, ear discharge, ear pain, nosebleeds and sore throat.   Eyes: Negative.  Negative for blurred vision, double vision and photophobia.  Respiratory: Negative.  Negative for cough, hemoptysis, sputum production and shortness of breath.   Cardiovascular: Negative for chest pain, palpitations, orthopnea and PND.       NSTEMI 10/2017. Ischemic CHF. LVEF 25%.  Gastrointestinal: Negative.  Negative for abdominal pain, blood in stool, constipation, diarrhea, heartburn, melena, nausea and vomiting.  Genitourinary: Negative.  Negative for dysuria, frequency, hematuria and urgency.  Musculoskeletal: Negative.  Negative for back pain, falls, joint pain, myalgias and neck pain.       Baker's cyst.  Skin: Negative.  Negative for itching and rash.  Neurological: Positive for speech change (dysarthria) and weakness  (generalized). Negative for dizziness, tremors, seizures and headaches.       S/pcerebellar ICH. (+) dysmetria. (+) coordination and gait abnormalities.  Balance has improved.  Requires assistance with ADLs.   Endo/Heme/Allergies: Negative.  Does not bruise/bleed easily.  Psychiatric/Behavioral: Positive for memory loss. Negative for depression. The patient is not nervous/anxious and does not have insomnia.   All other systems reviewed and are negative.  Performance status (ECOG): 3  Vital Signs BP (!) 154/84 (BP Location: Left Arm, Patient Position: Sitting)   Pulse (!) 59   Temp (!) 97.2 F (36.2 C) (Tympanic)   Resp 18   Ht 5' 3"  (1.6 m)   Wt 106 lb 9.6 oz (48.4 kg)   SpO2 98%   BMI 18.88 kg/m   Physical Exam  Constitutional: She is oriented to person, place, and time and well-developed, well-nourished, and in no distress. No distress.  She has a rolling walker by her side.  HENT:  Head: Normocephalic and atraumatic.  Mouth/Throat: Oropharynx is clear and moist. No oropharyngeal exudate.  Eyes: Pupils are equal, round, and reactive to light. Conjunctivae and EOM are normal. No scleral icterus.  Glasses.  Blue eyes.  Neck: Normal range of motion. Neck supple. No JVD present.  Cardiovascular: Normal rate, regular rhythm and normal heart sounds. Exam reveals no gallop and no friction rub.  No murmur heard. Pulmonary/Chest: Effort normal. No respiratory distress. She has no wheezes. She has no rales.  Abdominal: Soft. She exhibits no distension and no mass. There is no abdominal tenderness. There is no rebound and no guarding.  Musculoskeletal: Normal range of motion.        General: No edema.  Lymphadenopathy:    She has no cervical adenopathy.  Neurological: She is alert and oriented to person, place, and time. She displays weakness.  Word finding difficulties (stuttering speech).  Skin: No rash noted. She is not diaphoretic. No erythema. No pallor.  Psychiatric: Mood and  affect normal.  Nursing note and vitals reviewed.   Appointment on 07/08/2018  Component Date Value Ref Range Status  . Sodium 07/08/2018 141  135 - 145 mmol/L Final  . Potassium 07/08/2018 4.4  3.5 - 5.1 mmol/L Final  . Chloride 07/08/2018 106  98 -  111 mmol/L Final  . CO2 07/08/2018 27  22 - 32 mmol/L Final  . Glucose, Bld 07/08/2018 120* 70 - 99 mg/dL Final  . BUN 07/08/2018 29* 8 - 23 mg/dL Final  . Creatinine, Ser 07/08/2018 1.05* 0.44 - 1.00 mg/dL Final  . Calcium 07/08/2018 9.2  8.9 - 10.3 mg/dL Final  . Total Protein 07/08/2018 6.4* 6.5 - 8.1 g/dL Final  . Albumin 07/08/2018 3.9  3.5 - 5.0 g/dL Final  . AST 07/08/2018 15  15 - 41 U/L Final  . ALT 07/08/2018 13  0 - 44 U/L Final  . Alkaline Phosphatase 07/08/2018 34* 38 - 126 U/L Final  . Total Bilirubin 07/08/2018 0.8  0.3 - 1.2 mg/dL Final  . GFR calc non Af Amer 07/08/2018 49* >60 mL/min Final  . GFR calc Af Amer 07/08/2018 56* >60 mL/min Final  . Anion gap 07/08/2018 8  5 - 15 Final   Performed at [facility], [address] [place], [place] [address]  . WBC 07/08/2018 8.5  4.0 - 10.5 K/uL Final  . RBC 07/08/2018 4.34  3.87 - 5.11 MIL/uL Final  . Hemoglobin 07/08/2018 13.2  12.0 - 15.0 g/dL Final  . HCT 07/08/2018 42.9  36.0 - 46.0 % Final  . MCV 07/08/2018 98.8  80.0 - 100.0 fL Final  . MCH 07/08/2018 30.4  26.0 - 34.0 pg Final  . MCHC 07/08/2018 30.8  30.0 - 36.0 g/dL Final  . RDW 07/08/2018 15.8* 11.5 - 15.5 % Final  . Platelets 07/08/2018 391  150 - 400 K/uL Final  . nRBC 07/08/2018 0.0  0.0 - 0.2 % Final  . Neutrophils Relative % 07/08/2018 75  % Final  . Neutro Abs 07/08/2018 6.5  1.7 - 7.7 K/uL Final  . Lymphocytes Relative 07/08/2018 12  % Final  . Lymphs Abs 07/08/2018 1.0  0.7 - 4.0 K/uL Final  . Monocytes Relative 07/08/2018 7  % Final  . Monocytes Absolute 07/08/2018 0.6  0.1 - 1.0 K/uL Final  . Eosinophils Relative 07/08/2018 3  % Final  . Eosinophils Absolute 07/08/2018 0.2  0.0 - 0.5 K/uL  Final  . Basophils Relative 07/08/2018 2  % Final  . Basophils Absolute 07/08/2018 0.1  0.0 - 0.1 K/uL Final  . Immature Granulocytes 07/08/2018 1  % Final  . Abs Immature Granulocytes 07/08/2018 0.05  0.00 - 0.07 K/uL Final   Performed at [facility], [address] [place],  [address]     Assessment:  [name] is a 85 y.o. female with JAK2+ polycythemia rubra vera (PV) diagnosed in 2008. She has undergone low phlebotomies x 2-3 to maintain a target hematocrit is 40-42.5.   She is on hydroxyurea 500 mg (1 pill) on Mondays, Wednesdays, Fridays and Saturdays (5 pills/week). Screening for von Willebrand and a platelet function was normal. She is on a baby aspirin. She has been on Plavix since her TIA/CVA.   She has a history of iron deficiency anemia status post GI evaluation. Colonoscopy in 07/2007 was negative. For persistent recurrent iron deficiency anemia, she underwent EGD and colonoscopy in 12/2011 and 09/11/2013.  She has a history of carcinoid tumor after presenting with a partial small bowel obstruction.  Abdominal and pelvic CT scan on 03/22/2015 revealed a 3 x 4.9 cm apple core lesion involving a loop of small bowel in the right mid abdomen. There was associated 4 x 1.8 cm adjacent mesenteric implant. Ileocolectomy on 03/28/2015 revealed a grade I neuroendocrine tumor (  carcinoid) involving the terminal ileum and right colon.  Largest tumor was 2.5 cm. There were multiple adjacent nodules of a neuroendocrine tumor. Metastasis was in 9 of 12 lymph nodes.  Pathologic stage was T3N1.  Chromogranin was 4 on 05/07/2015, 3 on 04/13/2016, 4 on 11/09/2016, and 10 on 01/24/2018.   24 hour urine for 5HIAA was 5.2 mg/24 hours (0-14.9).  Octreotide scan on 05/31/2015 revealed no evidence of metastatic disease.  There was an indeterminate 9 mm nodule in the left apex.    Chest CT on 06/11/2015 revealed an 8 mm nodular density (spiculated on axial images) in the left apex. Chest  CT on 10/10/2015 revealed a stable 8 mm pulmonary nodule in the anterior left lung apex.  Chest, abdomen and pelvic CT scan on 04/20/2016 revealed no definite evidence of recurrent or metastatic carcinoid tumor. There are no acute findings. There was a stable 8 mm pulmonary nodule in the left apex recommendations. 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 Recommendation was continued attention on follow-up or follow-up with abdominal MRI with and without contrast in 2 years.  Chest CT angiogram on 10/28/2017 revealed a stable 8 mm irregular density at the LEFT lung apex, probable scar.  Abdomen MRI on 04/20/2018 revealed stable cystic lesion in the tail of the pancreas compared to CT 03/22/2015.  There were multiple indeterminate lesions within the liver which were not identified on comparison CT from 04/20/2016. Differential would include focal nodular hyperplasia versus metastatic disease.  PET scan on 04/27/2018 revealed no focal hypermetabolic uptake in the region of the hepatic lesions seen on recent abdominal MRI. While lack of uptake was somewhat reassuring, continued close attention recommended as well differentiated or low-grade neoplasm can be poorly FDG avid.  There was focal hypermetabolic uptake in a nondilated small bowel loop of the right lower quadrant without underlying nodule or mass evident by CT. Finding were indeterminate, but patient has a reported history of carcinoid.  There was a 9 mm left upper lobe pulmonary nodule stable since at least 05/30/2015 and shows no hypermetabolism.  Abdominal MRI on 07/04/2018 revealed slight interval growth of several of the previously noted hepaticlesions, which all demonstrate diffusion restriction. The largest lesion in segment 5 was 2.2 x 1.6 cm (previously 1.9 x 1.3 cm), lesion in segment 4 B was 1.7 x 1.5 cm (previously 1.5 x 1.3 cm), lesion in segment 5 was 1.2  x 1.1 cm (previously 1.0 x 0.9 cm), and lesion in segment 8 was 1.3 x 0.9 cm (previously 1.0 x 0.8 cm).  These were concerning for neoplasm and warrant further evaluation. These could be definitively characterized with MRI of the abdomen with and without IV gadolinium. Alternatively, biopsy of 1 of these lesions could be considered.  There was a 1.7 x 1.2 cm cystic appearing lesion in the tail of the pancreas, stable compared to the prior examinations dating back to 2016, considered benign.   She had shingles involving the right S1/S2 dermatome in 05/2017.    She was admitted to [facility] from 10/29/2017 - 11/16/2017.  She had a NSTEMI and underwent cardiac cath which showed three-vessel disease and severe LV dysfunction.  EF was 25%.  CABG was felt high risk.  Post cardiac cath, head CT showed a RIGHT cerebellar parenchymal hemorrhage felt to be secondary to hypertensive etiology on dual antiplatelet therapy. She was scheduled for PCI with stent placement on 01/13/2018, however  after second option with Dr. Clayborn Bigness, PCI was deferred.  She is on aspirin and Plavix.  Symptomatically, she denis any complaints.  She denies any flushing or diarrhea.  Plan: 1. Labs today:  CBC with diff, CMP, chromogranin, AFP. 2. JAK2+ MPN (PV) Hematocrit 42.9 and hemoglobin 13.2.  MCV 98.8. Phlebotomy 200 cc today.  Hematocrit goal <= 42. Platelet count 391,000.  Platelet goal is </= 400,000. Continue on hydroxyurea 500 mg x 5 pills/week.  3. Hepatic lesions on MRI Multiple indeterminate lesions within the liver on abdomen MRI.  Discuss recent abdominal MRI with comparison to abdominal MRI on 04/20/2018. PET scan on 04/27/2018 revealed no focal hypermetabolic uptake in the region of the liver. Review at tumor board. 4. Carcinoid tumor of the terminal ileum Patient denies any diarrhea or flushing. Chromogranin A level today. PET scan noted a lesion of unclear significance (focal hypermetabolic uptake in  a nondilated small bowel loop in the RLQ).  Consider 24-hour urine for 5-HIAA.  Consider future octreotide scan or 68-Ga DOTATATE PET scan. 5.  Call daughter after tumor board next week. 6.  RTC in 3 months in [place] for MD assessment, labs (CBC with diff, CMP), and +/- phlebotomy.   Honor Loh, NP 07/08/2018, 11:05 AM  I saw and evaluated the patient, participating in the key portions of the service and reviewing pertinent diagnostic studies and records.  I reviewed the nurse practitioner's note and agree with the findings and the plan.  The assessment and plan were discussed with the patient.  Several questions were asked by the patient and answered.   Nolon Stalls, MD 07/08/2018,11:05 AM

## 2018-07-08 NOTE — Progress Notes (Signed)
No new changes noted today 

## 2018-07-12 ENCOUNTER — Ambulatory Visit: Payer: Medicare Other | Admitting: Occupational Therapy

## 2018-07-12 ENCOUNTER — Encounter: Payer: Self-pay | Admitting: Physical Therapy

## 2018-07-12 ENCOUNTER — Encounter: Payer: Self-pay | Admitting: Occupational Therapy

## 2018-07-12 ENCOUNTER — Ambulatory Visit: Payer: Medicare Other | Admitting: Physical Therapy

## 2018-07-12 DIAGNOSIS — M6281 Muscle weakness (generalized): Secondary | ICD-10-CM

## 2018-07-12 DIAGNOSIS — R262 Difficulty in walking, not elsewhere classified: Secondary | ICD-10-CM

## 2018-07-12 DIAGNOSIS — R278 Other lack of coordination: Secondary | ICD-10-CM

## 2018-07-12 NOTE — Therapy (Signed)
[place] MAIN [facility] SERVICES [address] [place], [place], [address] Phone: [phone]   Fax:  [phone]  Physical Therapy Treatment  Patient Details  Name: [name] MRN: [id number] Date of Birth: 1932/01/18 Referring Provider (PT): Charlett Blake   Encounter Date: 07/12/2018  PT End of Session - 07/12/18 1110    Visit Number  29    Number of Visits  41    Date for PT Re-Evaluation  07/27/18    Authorization Type  9/10, start of care 01/31/18, last goals 95/63/87 (recert)    PT Start Time  1102    PT Stop Time  1145    PT Time Calculation (min)  43 min    Equipment Utilized During Treatment  Gait belt    Activity Tolerance  Patient tolerated treatment well    Behavior During Therapy  WFL for tasks assessed/performed       Past Medical History:  Diagnosis Date  . Adenocarcinoma in situ of cervix   . Arthritis    hands  . Breast cancer ([place])   . Colon cancer ([place])   . Diabetes mellitus without complication ([place])   . Endometrial carcinoma (HCC)    s/p total abdominal hysterectomy  . H/O compression fracture of spine 2014   thoracic spine  . H/O polycythemia vera   . H/O TIA (transient ischemic attack) and stroke 09/2014, 03/2015   No deficits  . Hypertension   . Hyperuricemia   . Microalbuminuria   . Polycythemia vera ([place])   . Recurrent falls   . Skin cancer    face, legs  . Stroke [facility]) 2008   no deficits  . Trochanteric bursitis   . Varicose veins    treated    Past Surgical History:  Procedure Laterality Date  . ABDOMINAL HYSTERECTOMY    . BOWEL RESECTION N/A 03/28/2015   Procedure: SMALL BOWEL RESECTION;  Surgeon: Leonie Green, MD;  Location: [facility] ORS;  Service: General;  Laterality: N/A;  . CATARACT EXTRACTION W/ INTRAOCULAR LENS IMPLANT Right   . CATARACT EXTRACTION W/PHACO Left 10/07/2015   Procedure: CATARACT EXTRACTION PHACO AND INTRAOCULAR LENS PLACEMENT (IOC);  Surgeon: Ronnell Freshwater, MD;  Location: [place];  Service: Ophthalmology;  Laterality: Left;  DIABETIC - oral meds VISION BLUE  . CORONARY ANGIOGRAPHY N/A 10/29/2017   Procedure: CORONARY ANGIOGRAPHY;  Surgeon: Dionisio David, MD;  Location: [place] CV LAB;  Service: Cardiovascular;  Laterality: N/A;  . EXPLORATORY LAPAROTOMY     for fibroids  . LEFT HEART CATH Right 10/29/2017   Procedure: Left Heart Cath;  Surgeon: Dionisio David, MD;  Location: [place] CV LAB;  Service: Cardiovascular;  Laterality: Right;  . TONSILLECTOMY      There were no vitals filed for this visit.  Subjective Assessment - 07/12/18 1108    Subjective  Patient reports increased knee soreness today as a result of cold weather. She reports that as she moves around it does feel better. She reports walking some at home without her walker and hasn't fallen. She states, "my caregivers would prefer I not walk at all it seems like."     Pertinent History    85 year old female with adenocarcinoma of the cervix as well as endometrial carcinoma and polycythemia vera as well as diabetes who had hypertensive right cerebellar hemorrhage onset 10/28/2017.  She was treated initially at [facility] and then transferred to [facility].  Neurosurgical evaluation  per Dr. Ronnald Ramp concluded no surgery was needed.  The patient completed inpatient rehabilitation at [facility] (including SLP) 11/04/2017-11/18/2107 and was discharged at a 24-hour supervision level.  The patient has hired caregiver to provide 24-hour supervision. Patient has received home health rehab services, including SLP.    Limitations  Standing;Walking    How long can you walk comfortably?  a few minutes    Patient Stated Goals  Patient wants to walk better and not need the RW.     Currently in Pain?  Yes    Pain Score  5     Pain Location  Knee    Pain Orientation  Right;Left    Pain Descriptors / Indicators  Sore    Pain Type  Chronic pain     Pain Onset  More than a month ago    Pain Frequency  Intermittent    Aggravating Factors   worse in the morning; worse with cold weather    Pain Relieving Factors  activity helps    Effect of Pain on Daily Activities  decreased transfer/walking tolerance;     Multiple Pain Sites  No            Warm up on Nustep, BUE/BLE level 2 x4 min (unbilled);  Patient instructed in advanced balance exercise  Gait outside in hallway:  Forward walking with head turns up/down, side/side x181fet each,  Gait in gym: Forward walking with vertical head turns Forward walking with ball up/down Forward walking with ball side/side When walking forward patient does exhibit good control with minimal sway; however when head turn initiated, she does exhibit increased lateral sway and often slows down gait with increased right foot drag; Able to complete head turns with supervision without AD; Instructed patient to increase gait speed and focus on picking up right foot to reduce sway; some improvement noted with verbal cues; She does exhibit less sway with vertical head turns as compared to horizontal. Does require min VCs for sequencing and gait speed for better dynamic balance;   Instructed patient in dynamic balance exercise: Resisted walking,12.5# forward/backward, side/side x4way, x3 laps each direction; required min A for safety and cues to improve weight shift especially with eccentric control for better balance control.Patient also required cues to increase step length, especially with forward/backward walking for reciprocal gait technique;   Weaving around cones #5 x4sets on carpet Required supervision with narrow cones with patient exhibiting less miss steps and less veering;   Side Stepping over cones #5 x2 sets with CGA to close supervision; Pt requires min VCs to increase step length for better foot clearance over cones;    Tolerated session well; does report mild  fatigue at end of session but no pain;                PT Education - 07/12/18 1110    Education Details  exercise technique, balance/gait safety    Person(s) Educated  Patient    Methods  Explanation;Verbal cues    Comprehension  Verbalized understanding;Returned demonstration;Verbal cues required;Need further instruction       PT Short Term Goals - 06/01/18 1428      PT SHORT TERM GOAL #1   Title  Patient will reduce timed up and go to <11 seconds to reduce fall risk and demonstrate improved transfer/gait ability.    Baseline  22.07 sec; 03/24/18: 14.2sec; 04/25/18: 14.0 sec with RW, 06/01/18: 12.7s    Time  2    Period  Weeks  Status  Partially Met    Target Date  06/29/18      PT SHORT TERM GOAL #2   Title  Patient will be independent with ascend/descend 2 steps using single UE in step over step pattern without LOB.    Baseline  03/24/18: CGA, single UE support, step-to pattern without LOB; 04/25/18: CGA, single UE support, step-over-step pattern without LOB; 06/01/18: CGA, single UE support    Time  2    Period  Weeks    Status  On-going    Target Date  06/29/17        PT Long Term Goals - 06/01/18 1428      PT LONG TERM GOAL #1   Title  Patient will be independent in home exercise program to improve strength/mobility for better functional independence with ADLs.    Baseline  04/25/18: unsure but does not remember doing HEP lately; 06/01/18: Pt performs intermittently    Time  8    Period  Weeks    Status  On-going    Target Date  07/27/18      PT LONG TERM GOAL #2   Title  Patient (> 35 years old) will complete five times sit to stand test in < 15 seconds indicating an increased LE strength and improved balance    Baseline  34.21 sec; 03/24/2018: 22.9 sec; 04/25/18: 18.5 sec with BUE support; 06/01/18: 15.5s with BUE support, still unable to perform STS without UE support    Time  8    Period  Weeks    Status  Partially Met    Target Date  07/27/18       PT LONG TERM GOAL #3   Title  Patient will increase six minute walk test distance to >1000 for progression to community ambulator and improve gait ability    Baseline  550 feet; 03/24/18: 795 ft.; 04/25/18: 975 ft with RW; 06/01/18: 1040' with RW    Time  8    Period  Weeks    Status  Achieved      PT LONG TERM GOAL #4   Title  Patient will increase 10 meter walk test to >1.55ms as to improve gait speed for better community ambulation and to reduce fall risk    Baseline  .49 m/sec; 03/24/18: 1.01 m/sec; 06/01/18: self-selected: 9.7s = 1.03 m/s, fastest: 8.0s = 1.25 m/s    Time  --    Period  Weeks    Status  Achieved      PT LONG TERM GOAL #5   Title  Pt will improve BERG by at least 3 points in order to demonstrate clinically significant improvement in balance.      Baseline  06/01/18: 44/56    Time  8    Period  Weeks    Status  New    Target Date  07/27/18            Plan - 07/12/18 1257    Clinical Impression Statement  Patient is progressing well. Patient does exhibit less instability when weaving around cones. advanced exercise with utilizing carpet surface for added challenge; Patient continues to have some unsteadiness with lateral head turns which is likely related to slowing down her gait with head turns. Patient would benefit from additional skilled PT Intervention to improve strength, balance and gait safety;     PT Frequency  2x / week    PT Duration  8 weeks    PT Treatment/Interventions  Patient/family education;Neuromuscular re-education;Balance training;Stair  training;Therapeutic activities;Therapeutic exercise;Gait training;Aquatic Therapy    PT Next Visit Plan  balance and strengthening    Consulted and Agree with Plan of Care  Patient       Patient will benefit from skilled therapeutic intervention in order to improve the following deficits and impairments:  Abnormal gait, Decreased balance, Decreased endurance, Decreased mobility, Impaired flexibility,  Decreased strength, Decreased knowledge of use of DME, Decreased activity tolerance, Difficulty walking  Visit Diagnosis: Muscle weakness (generalized)  Other lack of coordination  Difficulty in walking, not elsewhere classified     Problem List Patient Active Problem List   Diagnosis Date Noted  . Liver lesion 04/22/2018  . Goals of care, counseling/discussion 04/22/2018  . Cognitive deficit, post-stroke 12/31/2017  . Ataxia, post-stroke 12/31/2017  . Dysarthria, post-stroke   . Gait disturbance, post-stroke   . H/O cerebral parenchymal hemorrhage 11/04/2017  . Essential hypertension 11/04/2017  . Hyperlipidemia 11/04/2017  . Diabetes ([place]) 11/04/2017  . CAD (coronary artery disease) 11/04/2017  . 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 Hypokalemia 11/04/2017  . Right-sided nontraumatic intracerebral hemorrhage of cerebellum ([place])   . History of cervical cancer   . History of TIA (transient ischemic attack)   . Acute systolic congestive heart failure ([place])   . Reactive hypertension   . Hypernatremia   . Leukocytosis   . Acute blood loss anemia   . Elevated serum creatinine   . Acute respiratory failure with hypoxia ([place])   . IVH (intraventricular hemorrhage) ([place]) 10/29/2017  . Hypoxia 10/28/2017  . Pancreatic lesion 05/24/2017  . 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 Nodule of upper lobe of left lung 06/04/2015  . 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 Cerebral thrombosis with cerebral infarction 04/03/2015  . Cancer of right colon ([place]) 03/28/2015  . CVA (cerebral infarction) 12/21/2014  . Polycythemia vera ([place]) 06/22/2006    Nashae Maudlin PT, DPT 07/12/2018, 1:00 PM  [place] MAIN [facility] SERVICES [address] [place], [place], [address] Phone: [phone]   Fax:  [phone]  Name: [name] MRN: [id number] Date of Birth: [birth year]

## 2018-07-12 NOTE — Therapy (Signed)
[place] MAIN [facility] SERVICES [address] [place], [place], [address] Phone: [phone]   Fax:  [phone]  Occupational Therapy Treatment  Patient Details  Name: [name] MRN: [id number] Date of Birth: 1932/01/08 No data recorded  Encounter Date: 07/12/2018  OT End of Session - 07/12/18 1151    Visit Number  32    Number of Visits  88    Date for OT Re-Evaluation  09/15/18    OT Start Time  1145    OT Stop Time  1230    OT Time Calculation (min)  45 min    Activity Tolerance  Patient tolerated treatment well    Behavior During Therapy  [facility] for tasks assessed/performed       Past Medical History:  Diagnosis Date  . Adenocarcinoma in situ of cervix   . Arthritis    hands  . Breast cancer ([place])   . Colon cancer ([place])   . Diabetes mellitus without complication ([place])   . Endometrial carcinoma (HCC)    s/p total abdominal hysterectomy  . H/O compression fracture of spine 2014   thoracic spine  . H/O polycythemia vera   . H/O TIA (transient ischemic attack) and stroke 09/2014, 03/2015   No deficits  . Hypertension   . Hyperuricemia   . Microalbuminuria   . Polycythemia vera ([place])   . Recurrent falls   . Skin cancer    face, legs  . Stroke [facility]) 2008   no deficits  . Trochanteric bursitis   . Varicose veins    treated    Past Surgical History:  Procedure Laterality Date  . ABDOMINAL HYSTERECTOMY    . BOWEL RESECTION N/A 03/28/2015   Procedure: SMALL BOWEL RESECTION;  Surgeon: Leonie Green, MD;  Location: [facility] ORS;  Service: General;  Laterality: N/A;  . CATARACT EXTRACTION W/ INTRAOCULAR LENS IMPLANT Right   . CATARACT EXTRACTION W/PHACO Left 10/07/2015   Procedure: CATARACT EXTRACTION PHACO AND INTRAOCULAR LENS PLACEMENT (IOC);  Surgeon: Ronnell Freshwater, MD;  Location: [place];  Service: Ophthalmology;  Laterality: Left;  DIABETIC - oral meds VISION BLUE  . CORONARY ANGIOGRAPHY N/A  10/29/2017   Procedure: CORONARY ANGIOGRAPHY;  Surgeon: Dionisio David, MD;  Location: [place] CV LAB;  Service: Cardiovascular;  Laterality: N/A;  . EXPLORATORY LAPAROTOMY     for fibroids  . LEFT HEART CATH Right 10/29/2017   Procedure: Left Heart Cath;  Surgeon: Dionisio David, MD;  Location: [place] CV LAB;  Service: Cardiovascular;  Laterality: Right;  . TONSILLECTOMY      There were no vitals filed for this visit.  Subjective Assessment - 07/12/18 1151    Subjective   Pt. having an eye appointment next month.    Pertinent History  85 year old female with adenocarcinoma of the cervix as well as endometrial carcinoma and polycythemia vera as well as diabetes who had hypertensive right cerebellar hemorrhage onset 10/28/2017.  She was treated initially at [facility] and then transferred to [facility].  Neurosurgical evaluation per Dr. Ronnald Ramp concluded no surgery was needed.  The patient completed inpatient rehabilitation at [facility] (including SLP) 11/04/2017-11/18/2107 and was discharged at a 24-hour supervision level.  The patient has hired caregiver to provide 24-hour supervision. Patient has received home health rehab services, including SLP.     Patient Stated Goals  Patient would like to be independent in all tasks and be able to live alone again.  Currently in Pain?  No/denies      OT TREATMENT    Selfcare:  Pt. worked on Estate agent tasks, and Dietitian lists of words while focusing on writing legibility. Pt. demonstrated using a mature grasp on a standard pen. Pt. presented with 50% legibility for cursive form, and 50% in printed form. Pt. Presented with decreased picking up the pen from the paper when printing. Pt. Is improving with formulating numbers. Pt. required increased time to complete. Pt. worked on Estate agent tasks in preparation for [employer] tasks, and writing correspondence notecards.                       OT Education - 07/12/18 1151    Education Details  writing legibility    Person(s) Educated  Patient    Methods  Explanation;Demonstration;Verbal cues    Comprehension  Verbalized understanding;Returned demonstration;Verbal cues required          OT Long Term Goals - 07/05/18 1645      OT LONG TERM GOAL #1   Title  Patient will complete bathing with modified independence    Baseline  1/14/20120: Pt. continues to have difficulty with washing the bottom of her feet, however is able to complete it.    Time  12    Period  Weeks    Status  Partially Met    Target Date  09/15/18      OT LONG TERM GOAL #2   Title  Patient will complete dressing skills with modified independence    Baseline  07/05/2018: Pt. conitnues to require assist with hooking her bra. Pt. requires care giver assist secondary to requiring increased time to complete.    Time  12    Period  Weeks    Status  On-going    Target Date  09/15/18      OT LONG TERM GOAL #3   Title  Patient will complete light meal prep with min assist    Baseline  07/05/2018: Pt. is able to fix herself something light to eat, However requires assist formeal preparation.    Time  12    Period  Weeks    Status  On-going    Target Date  09/15/18      OT LONG TERM GOAL #4   Title  Patient will increase R grip strength by 5# to open jars and containers with modified independence    Baseline  07/05/2018: Grip is improving, however requires assist to open containers.    Time  12    Period  Weeks    Status  On-going    Target Date  09/15/18      OT LONG TERM GOAL #5   Title  Patient will improve strength by 1 mm grade RUE to assist with obtaining items from the closet.     Baseline  07/05/2018: Pt. has made progress, and has difficulty reaching for items in the closet.    Time  8    Period  Weeks    Status  On-going    Target Date  09/15/18      OT LONG TERM GOAL #6   Title  Pt will increase  handwriting legibility and speed to 50% while writing a 3 sentence paragraph in order to write thank you notes and birthday cards for friends and family    Baseline  07/05/2018: 50% writing lebility for one sentence with increased time.    Time  5  Period  Weeks    Status  On-going    Target Date  09/15/18            Plan - 07/12/18 1153    Clinical Impression Statement Pt. is making steady progress. Pt. continues to have difficulty writing cards, and checks legibly, and efficiently. Pt. is improving with reaching into closets, and cabinets at home. Pt. continues to work on improving UE functioning, and [facility] skills to be able to write, and send correspondence cards, and write checks legibly.    Occupational Profile and client history currently impacting functional performance  family more than 30 mins away, has 24 hour caregiver assist, decreased awareness, memory and safety    Occupational performance deficits (Please refer to evaluation for details):  ADL's;IADL's;Social Participation    Rehab Potential  Good    Current Impairments/barriers affecting progress:  memory, requires 24 hour caregivers,     OT Frequency  2x / week    OT Duration  12 weeks    OT Treatment/Interventions  Self-care/ADL training;Cryotherapy;Therapeutic exercise;DME and/or AE instruction;Functional Mobility Training;Cognitive remediation/compensation;Balance training;Neuromuscular education;Manual Therapy;Moist Heat;Therapeutic activities;Patient/family education    Clinical Decision Making  Several treatment options, min-mod task modification necessary    Consulted and Agree with Plan of Care  Patient    Family Member Consulted  Caregiver       Patient will benefit from skilled therapeutic intervention in order to improve the following deficits and impairments:  Decreased balance, Decreased mobility, Difficulty walking, Decreased cognition, Decreased activity tolerance, Decreased coordination, Decreased  strength, Impaired UE functional use  Visit Diagnosis: Muscle weakness (generalized)  Other lack of coordination    Problem List Patient Active Problem List   Diagnosis Date Noted  . Liver lesion 04/22/2018  . Goals of care, counseling/discussion 04/22/2018  . Cognitive deficit, post-stroke 12/31/2017  . Ataxia, post-stroke 12/31/2017  . Dysarthria, post-stroke   . Gait disturbance, post-stroke   . H/O cerebral parenchymal hemorrhage 11/04/2017  . Essential hypertension 11/04/2017  . Hyperlipidemia 11/04/2017  . Diabetes ([place]) 11/04/2017  . CAD (coronary artery disease) 11/04/2017  . 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 Hypokalemia 11/04/2017  . Right-sided nontraumatic intracerebral hemorrhage of cerebellum ([place])   . History of cervical cancer   . History of TIA (transient ischemic attack)   . Acute systolic congestive heart failure ([place])   . Reactive hypertension   . Hypernatremia   . Leukocytosis   . Acute blood loss anemia   . Elevated serum creatinine   . Acute respiratory failure with hypoxia ([place])   . IVH (intraventricular hemorrhage) ([place]) 10/29/2017  . Hypoxia 10/28/2017  . Pancreatic lesion 05/24/2017  . 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 Nodule of upper lobe of left lung 06/04/2015  . 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 Cerebral thrombosis with cerebral infarction 04/03/2015  . Cancer of right colon ([place]) 03/28/2015  . CVA (cerebral infarction) 12/21/2014  . Polycythemia vera ([place]) 06/22/2006    Harrel Carina, MS, OTR/L 07/12/2018, 12:03 PM  [place] MAIN [facility] SERVICES [address] [place], [place], [address] Phone: [phone]   Fax:  [phone]  Name: [name] MRN: [id number] Date of Birth: 1931-11-16

## 2018-07-13 LAB — CHROMOGRANIN A

## 2018-07-14 ENCOUNTER — Other Ambulatory Visit: Payer: Self-pay | Admitting: Hematology and Oncology

## 2018-07-14 ENCOUNTER — Other Ambulatory Visit: Payer: Medicare Other

## 2018-07-14 ENCOUNTER — Other Ambulatory Visit: Payer: Self-pay

## 2018-07-14 ENCOUNTER — Ambulatory Visit: Payer: Medicare Other | Admitting: Occupational Therapy

## 2018-07-14 ENCOUNTER — Encounter: Payer: Self-pay | Admitting: Occupational Therapy

## 2018-07-14 DIAGNOSIS — R278 Other lack of coordination: Secondary | ICD-10-CM

## 2018-07-14 DIAGNOSIS — D3A029 Benign carcinoid tumor of the large intestine, unspecified portion: Secondary | ICD-10-CM

## 2018-07-14 DIAGNOSIS — M6281 Muscle weakness (generalized): Secondary | ICD-10-CM

## 2018-07-14 LAB — AFP TUMOR MARKER: AFP, Serum, Tumor Marker: 1.2 ng/mL (ref 0.0–8.3)

## 2018-07-14 NOTE — Progress Notes (Signed)
Tumor Board Documentation  [name] was presented by Dr Mike Gip at our Tumor Board on 07/14/2018, which included representatives from medical oncology, radiation oncology, surgical, radiology, pathology, internal medicine, palliative care, research, nutrition, pulmonology.  [name] currently presents as a current patient, for [place], for discussion with history of the following treatments: active survellience.  Additionally, we reviewed previous medical and familial history, history of present illness, and recent lab results along with all available histopathologic and imaging studies. The tumor board considered available treatment options and made the following recommendations: Biopsy per Interventional Radiology    The following procedures/referrals were also placed: No orders of the defined types were placed in this encounter.   Clinical Trial Status: not discussed   Staging used:    National site-specific guidelines   were discussed with respect to the case.  Tumor board is a meeting of clinicians from various specialty areas who evaluate and discuss patients for whom a multidisciplinary approach is being considered. Final determinations in the plan of care are those of the provider(s). The responsibility for follow up of recommendations given during tumor board is that of the provider.   Today's extended care, comprehensive team conference, [name] was not present for the discussion and was not examined.   Multidisciplinary Tumor Board is a multidisciplinary case peer review process.  Decisions discussed in the Multidisciplinary Tumor Board reflect the opinions of the specialists present at the conference without having examined the patient.  Ultimately, treatment and diagnostic decisions rest with the primary provider(s) and the patient.

## 2018-07-14 NOTE — Therapy (Signed)
[place] MAIN [facility] SERVICES [address] [place], [place], [address] Phone: [phone]   Fax:  [phone]  Occupational Therapy Treatment  Patient Details  Name: [name] MRN: [id number] Date of Birth: [birth year] No data recorded  Encounter Date: 07/14/2018  OT End of Session - 07/14/18 1111    Visit Number  33    Number of Visits  55    Date for OT Re-Evaluation  09/15/18    Authorization Type  Medicare    Authorization Time Period  Next progress report period starting 07/05/2018    OT Start Time  1100    OT Stop Time  1145    OT Time Calculation (min)  45 min    Activity Tolerance  Patient tolerated treatment well    Behavior During Therapy  [facility] for tasks assessed/performed       Past Medical History:  Diagnosis Date  . Adenocarcinoma in situ of cervix   . Arthritis    hands  . Breast cancer ([place])   . Colon cancer ([place])   . Diabetes mellitus without complication ([place])   . Endometrial carcinoma (HCC)    s/p total abdominal hysterectomy  . H/O compression fracture of spine 2014   thoracic spine  . H/O polycythemia vera   . H/O TIA (transient ischemic attack) and stroke 09/2014, 03/2015   No deficits  . Hypertension   . Hyperuricemia   . Microalbuminuria   . Polycythemia vera ([place])   . Recurrent falls   . Skin cancer    face, legs  . Stroke [facility]) 2008   no deficits  . Trochanteric bursitis   . Varicose veins    treated    Past Surgical History:  Procedure Laterality Date  . ABDOMINAL HYSTERECTOMY    . BOWEL RESECTION N/A 03/28/2015   Procedure: SMALL BOWEL RESECTION;  Surgeon: Leonie Green, MD;  Location: [facility] ORS;  Service: General;  Laterality: N/A;  . CATARACT EXTRACTION W/ INTRAOCULAR LENS IMPLANT Right   . CATARACT EXTRACTION W/PHACO Left 10/07/2015   Procedure: CATARACT EXTRACTION PHACO AND INTRAOCULAR LENS PLACEMENT (IOC);  Surgeon: Ronnell Freshwater, MD;  Location: [place];   Service: Ophthalmology;  Laterality: Left;  DIABETIC - oral meds VISION BLUE  . CORONARY ANGIOGRAPHY N/A 10/29/2017   Procedure: CORONARY ANGIOGRAPHY;  Surgeon: Dionisio David, MD;  Location: [place] CV LAB;  Service: Cardiovascular;  Laterality: N/A;  . EXPLORATORY LAPAROTOMY     for fibroids  . LEFT HEART CATH Right 10/29/2017   Procedure: Left Heart Cath;  Surgeon: Dionisio David, MD;  Location: [place] CV LAB;  Service: Cardiovascular;  Laterality: Right;  . TONSILLECTOMY      There were no vitals filed for this visit.  Subjective Assessment - 07/14/18 1109    Subjective   Pt. reports she is doing well today. Pt. continues to have caregivers 24 hours a day.    Pertinent History  85 year old female with adenocarcinoma of the cervix as well as endometrial carcinoma and polycythemia vera as well as diabetes who had hypertensive right cerebellar hemorrhage onset 10/28/2017.  She was treated initially at [facility] and then transferred to [facility].  Neurosurgical evaluation per Dr. Ronnald Ramp concluded no surgery was needed.  The patient completed inpatient rehabilitation at [facility] (including SLP) 11/04/2017-11/18/2107 and was discharged at a 24-hour supervision level.  The patient has hired caregiver to provide 24-hour supervision. Patient has received home health rehab  services, including SLP.     Patient Stated Goals  Patient would like to be independent in all tasks and be able to live alone again.    Currently in Pain?  No/denies      OT TREATMENT    Selfcare:  Pt. worked on Herbalist with various types of curves, lines, and circles. Pt. deviated from the lines, curves, and the larger circles multiple times. Pt. worked on Systems developer in print form with 50% legibility. Pt. required increased cues for filling out a check register.                            OT Education - 07/14/18  1111    Education Details  writing legibility    Person(s) Educated  Patient    Methods  Explanation;Demonstration;Verbal cues    Comprehension  Verbalized understanding;Returned demonstration;Verbal cues required          OT Long Term Goals - 07/05/18 1645      OT LONG TERM GOAL #1   Title  Patient will complete bathing with modified independence    Baseline  1/14/20120: Pt. continues to have difficulty with washing the bottom of her feet, however is able to complete it.    Time  12    Period  Weeks    Status  Partially Met    Target Date  09/15/18      OT LONG TERM GOAL #2   Title  Patient will complete dressing skills with modified independence    Baseline  07/05/2018: Pt. conitnues to require assist with hooking her bra. Pt. requires care giver assist secondary to requiring increased time to complete.    Time  12    Period  Weeks    Status  On-going    Target Date  09/15/18      OT LONG TERM GOAL #3   Title  Patient will complete light meal prep with min assist    Baseline  07/05/2018: Pt. is able to fix herself something light to eat, However requires assist formeal preparation.    Time  12    Period  Weeks    Status  On-going    Target Date  09/15/18      OT LONG TERM GOAL #4   Title  Patient will increase R grip strength by 5# to open jars and containers with modified independence    Baseline  07/05/2018: Grip is improving, however requires assist to open containers.    Time  12    Period  Weeks    Status  On-going    Target Date  09/15/18      OT LONG TERM GOAL #5   Title  Patient will improve strength by 1 mm grade RUE to assist with obtaining items from the closet.     Baseline  07/05/2018: Pt. has made progress, and has difficulty reaching for items in the closet.    Time  8    Period  Weeks    Status  On-going    Target Date  09/15/18      OT LONG TERM GOAL #6   Title  Pt will increase handwriting legibility and speed to 50% while writing a 3 sentence  paragraph in order to write thank you notes and birthday cards for friends and family    Baseline  07/05/2018: 50% writing lebility for one sentence with increased time.  Time  5    Period  Weeks    Status  On-going    Target Date  09/15/18            Plan - 07/14/18 1112    Clinical Impression Statement  Pt. worked on improving RUE strength, and [facility] skills. Pt. continues to present with limited UE strength, and and [facility] skills which affects her ability to perform ADL, and IADL tasks. Pt. conitnues to work on these skills in order to improve writing skills legibly, and efficiently.    Occupational Profile and client history currently impacting functional performance  family more than 30 mins away, has 24 hour caregiver assist, decreased awareness, memory and safety    Occupational performance deficits (Please refer to evaluation for details):  ADL's;IADL's;Social Participation    Rehab Potential  Good    Current Impairments/barriers affecting progress:  memory, requires 24 hour caregivers,     OT Frequency  2x / week    OT Duration  12 weeks    OT Treatment/Interventions  Self-care/ADL training;Cryotherapy;Therapeutic exercise;DME and/or AE instruction;Functional Mobility Training;Cognitive remediation/compensation;Balance training;Neuromuscular education;Manual Therapy;Moist Heat;Therapeutic activities;Patient/family education    Clinical Decision Making  Several treatment options, min-mod task modification necessary    Consulted and Agree with Plan of Care  Patient    Family Member Consulted  Caregiver       Patient will benefit from skilled therapeutic intervention in order to improve the following deficits and impairments:  Decreased balance, Decreased mobility, Difficulty walking, Decreased cognition, Decreased activity tolerance, Decreased coordination, Decreased strength, Impaired UE functional use  Visit Diagnosis: Muscle weakness (generalized)  Other lack of  coordination    Problem List Patient Active Problem List   Diagnosis Date Noted  . Liver lesion 04/22/2018  . Goals of care, counseling/discussion 04/22/2018  . Cognitive deficit, post-stroke 12/31/2017  . Ataxia, post-stroke 12/31/2017  . Dysarthria, post-stroke   . Gait disturbance, post-stroke   . H/O cerebral parenchymal hemorrhage 11/04/2017  . Essential hypertension 11/04/2017  . Hyperlipidemia 11/04/2017  . Diabetes ([place]) 11/04/2017  . CAD (coronary artery disease) 11/04/2017  . 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 Hypokalemia 11/04/2017  . Right-sided nontraumatic intracerebral hemorrhage of cerebellum ([place])   . History of cervical cancer   . History of TIA (transient ischemic attack)   . Acute systolic congestive heart failure ([place])   . Reactive hypertension   . Hypernatremia   . Leukocytosis   . Acute blood loss anemia   . Elevated serum creatinine   . Acute respiratory failure with hypoxia ([place])   . IVH (intraventricular hemorrhage) ([place]) 10/29/2017  . Hypoxia 10/28/2017  . Pancreatic lesion 05/24/2017  . 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 Nodule of upper lobe of left lung 06/04/2015  . 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 Cerebral thrombosis with cerebral infarction 04/03/2015  . Cancer of right colon ([place]) 03/28/2015  . CVA (cerebral infarction) 12/21/2014  . Polycythemia vera ([place]) 06/22/2006    Harrel Carina, MS, OTR/L 07/14/2018, 11:24 AM  [place] MAIN [facility] SERVICES [address] [place], [place], [address] Phone: [phone]   Fax:  [phone]  Name: [name] MRN: [id number] Date of Birth: 03-25-1932

## 2018-07-15 LAB — CHROMOGRANIN A: Chromogranin A (ng/mL): 205.8 ng/mL — ABNORMAL HIGH (ref 0.0–101.8)

## 2018-07-15 LAB — AFP TUMOR MARKER

## 2018-07-18 ENCOUNTER — Inpatient Hospital Stay: Payer: Medicare Other

## 2018-07-18 DIAGNOSIS — D45 Polycythemia vera: Secondary | ICD-10-CM | POA: Diagnosis not present

## 2018-07-19 ENCOUNTER — Encounter: Payer: Self-pay | Admitting: Occupational Therapy

## 2018-07-19 ENCOUNTER — Encounter: Payer: Self-pay | Admitting: Physical Therapy

## 2018-07-19 ENCOUNTER — Ambulatory Visit: Payer: Medicare Other | Admitting: Occupational Therapy

## 2018-07-19 ENCOUNTER — Ambulatory Visit: Payer: Medicare Other | Admitting: Physical Therapy

## 2018-07-19 DIAGNOSIS — M6281 Muscle weakness (generalized): Secondary | ICD-10-CM

## 2018-07-19 DIAGNOSIS — R278 Other lack of coordination: Secondary | ICD-10-CM

## 2018-07-19 DIAGNOSIS — R262 Difficulty in walking, not elsewhere classified: Secondary | ICD-10-CM

## 2018-07-19 NOTE — Therapy (Signed)
[place] MAIN [facility] SERVICES [address] [place], [place], [address] Phone: [phone]   Fax:  [phone]  Physical Therapy Treatment Physical Therapy Progress Note   Dates of reporting period 06/01/18   to   07/19/18   Patient Details  Name: [name] MRN: [id number] Date of Birth: [birth year] Referring Provider (PT): Charlett Blake   Encounter Date: 07/19/2018  PT End of Session - 07/19/18 1108    Visit Number  30    Number of Visits  35    Date for PT Re-Evaluation  08/16/18    Authorization Type  10/10 start of care 01/31/18, last goals 07/19/18     PT Start Time  1102    PT Stop Time  1145    PT Time Calculation (min)  43 min    Equipment Utilized During Treatment  Gait belt    Activity Tolerance  Patient tolerated treatment well    Behavior During Therapy  WFL for tasks assessed/performed       Past Medical History:  Diagnosis Date  . Adenocarcinoma in situ of cervix   . Arthritis    hands  . Breast cancer ([place])   . Colon cancer ([place])   . Diabetes mellitus without complication ([place])   . Endometrial carcinoma (HCC)    s/p total abdominal hysterectomy  . H/O compression fracture of spine 2014   thoracic spine  . H/O polycythemia vera   . H/O TIA (transient ischemic attack) and stroke 09/2014, 03/2015   No deficits  . Hypertension   . Hyperuricemia   . Microalbuminuria   . Polycythemia vera ([place])   . Recurrent falls   . Skin cancer    face, legs  . Stroke [facility]) 2008   no deficits  . Trochanteric bursitis   . Varicose veins    treated    Past Surgical History:  Procedure Laterality Date  . ABDOMINAL HYSTERECTOMY    . BOWEL RESECTION N/A 03/28/2015   Procedure: SMALL BOWEL RESECTION;  Surgeon: Leonie Green, MD;  Location: [facility] ORS;  Service: General;  Laterality: N/A;  . CATARACT EXTRACTION W/ INTRAOCULAR LENS IMPLANT Right   . CATARACT EXTRACTION W/PHACO Left 10/07/2015   Procedure: CATARACT  EXTRACTION PHACO AND INTRAOCULAR LENS PLACEMENT (IOC);  Surgeon: Ronnell Freshwater, MD;  Location: [place];  Service: Ophthalmology;  Laterality: Left;  DIABETIC - oral meds VISION BLUE  . CORONARY ANGIOGRAPHY N/A 10/29/2017   Procedure: CORONARY ANGIOGRAPHY;  Surgeon: Dionisio David, MD;  Location: [place] CV LAB;  Service: Cardiovascular;  Laterality: N/A;  . EXPLORATORY LAPAROTOMY     for fibroids  . LEFT HEART CATH Right 10/29/2017   Procedure: Left Heart Cath;  Surgeon: Dionisio David, MD;  Location: [place] CV LAB;  Service: Cardiovascular;  Laterality: Right;  . TONSILLECTOMY      There were no vitals filed for this visit.  Subjective Assessment - 07/19/18 1105    Subjective  Patient reports doing okay. She reports her balance is about the same. She has not had any recent falls. She still uses her walker at home intermittently. Rports adherence to HEP; Reports intermittent right knee pain;     Pertinent History    85 year old female with adenocarcinoma of the cervix as well as endometrial carcinoma and polycythemia vera as well as diabetes who had hypertensive right cerebellar hemorrhage onset 10/28/2017.  She was treated initially at [facility] and then transferred to  [place].  Neurosurgical evaluation per Dr. Ronnald Ramp concluded no surgery was needed.  The patient completed inpatient rehabilitation at [facility] (including SLP) 11/04/2017-11/18/2107 and was discharged at a 24-hour supervision level.  The patient has hired caregiver to provide 24-hour supervision. Patient has received home health rehab services, including SLP.    Limitations  Standing;Walking    How long can you walk comfortably?  a few minutes    Patient Stated Goals  Patient wants to walk better and not need the RW.     Currently in Pain?  No/denies    Multiple Pain Sites  No         [facility] PT Assessment - 07/19/18 0001      6 Minute Walk- Baseline   6 Minute Walk-  Baseline  yes    BP (mmHg)  116/64    HR (bpm)  64    02 Sat (%RA)  100 %      6 Minute walk- Post Test   BP (mmHg)  136/64    HR (bpm)  65    02 Sat (%RA)  100 %    Modified Borg Scale for Dyspnea  3- Moderate shortness of breath or breathing difficulty      6 minute walk test results    Aerobic Endurance Distance Walked  1050    Endurance additional comments  no AD, supervision, community ambulator, slightly improved from 1040 feet on 06/01/18      Standardized Balance Assessment   Five times sit to stand comments   20 sec with 1 HHA on arm rest, >15 sec indicates high fall risk      Berg Balance Test   Sit to Stand  Able to stand  independently using hands    Standing Unsupported  Able to stand safely 2 minutes    Sitting with Back Unsupported but Feet Supported on Floor or Stool  Able to sit safely and securely 2 minutes    Stand to Sit  Controls descent by using hands    Transfers  Able to transfer safely, definite need of hands    Standing Unsupported with Eyes Closed  Able to stand 10 seconds safely    Standing Ubsupported with Feet Together  Able to place feet together independently and stand 1 minute safely    From Standing, Reach Forward with Outstretched Arm  Can reach confidently >25 cm (10")    From Standing Position, Pick up Object from Floor  Able to pick up shoe safely and easily    From Standing Position, Turn to Look Behind Over each Shoulder  Looks behind from both sides and weight shifts well    Turn 360 Degrees  Able to turn 360 degrees safely but slowly    Standing Unsupported, Alternately Place Feet on Step/Stool  Able to complete 4 steps without aid or supervision    Standing Unsupported, One Foot in Front  Able to plae foot ahead of the other independently and hold 30 seconds    Standing on One Leg  Able to lift leg independently and hold equal to or more than 3 seconds    Total Score  46    Berg comment:  >50% risk for falls; slight improved from 06/01/18  which was 44/56      Timed Up and Go Test   Normal TUG (seconds)  12.2    TUG Comments  without AD, good safety with turns; low fall risk  TREATMENT: Patient instructed in outcome measures to address goals, see above; She does require min VCs for correct exercise/activity technique; She does exhibit improved gait ability wih 6 min walk test completing without AD with better cardiovascular response and less jump in BP indicating improving cardiovascular conditioning;  Patient continues to be at a high risk for falls; She was able to progress to sit<>Stand with 1 HHA although is still slower due to weakness and knee discomfort;  Reinforced HEP; Finished with Nustep BUE/BLE level 2 x5 min (unbilled);                  PT Education - 07/19/18 1106    Education Details  exercise technique, balance/gait safety; progress towards goals;     Person(s) Educated  Patient    Methods  Explanation;Verbal cues    Comprehension  Verbalized understanding;Returned demonstration;Verbal cues required;Need further instruction       PT Short Term Goals - 07/19/18 1123      PT SHORT TERM GOAL #1   Title  Patient will reduce timed up and go to <11 seconds to reduce fall risk and demonstrate improved transfer/gait ability.    Baseline  22.07 sec; 03/24/18: 14.2sec; 04/25/18: 14.0 sec with RW, 06/01/18: 12.7s, 07/19/18: 11.12 sec    Time  2    Period  Weeks    Status  Achieved      PT SHORT TERM GOAL #2   Title  Patient will be independent with ascend/descend 2 steps using single UE in step over step pattern without LOB.    Baseline  03/24/18: CGA, single UE support, step-to pattern without LOB; 04/25/18: CGA, single UE support, step-over-step pattern without LOB; 06/01/18: CGA, single UE support; 07/19/18: supervision with 1 rail, but does step to pattern descending;     Time  2    Period  Weeks    Status  Partially Met    Target Date  08/02/18        PT Long Term Goals -  07/19/18 1124      PT LONG TERM GOAL #1   Title  Patient will be independent in home exercise program to improve strength/mobility for better functional independence with ADLs.    Baseline  04/25/18: unsure but does not remember doing HEP lately; 06/01/18: Pt performs intermittently    Time  8    Period  Weeks    Status  Achieved      PT LONG TERM GOAL #2   Title  Patient (> 60 years old) will complete five times sit to stand test in < 15 seconds indicating an increased LE strength and improved balance    Baseline  34.21 sec; 03/24/2018: 22.9 sec; 04/25/18: 18.5 sec with BUE support; 06/01/18: 15.5s with BUE support, still unable to perform STS without UE support; 07/19/18: 20 sec with 1 HHA    Time  4    Period  Weeks    Status  Not Met    Target Date  08/16/18      PT LONG TERM GOAL #3   Title  Patient will increase six minute walk test distance to >1000 for progression to community ambulator and improve gait ability    Baseline  550 feet; 03/24/18: 795 ft.; 04/25/18: 975 ft with RW; 06/01/18: 1040' with RW; 07/19/18: 1050 without AD    Time  8    Period  Weeks    Status  Achieved      PT LONG TERM GOAL #4  Title  Patient will increase 10 meter walk test to >1.77ms as to improve gait speed for better community ambulation and to reduce fall risk    Baseline  .49 m/sec; 03/24/18: 1.01 m/sec; 06/01/18: self-selected: 9.7s = 1.03 m/s, fastest: 8.0s = 1.25 m/s    Period  Weeks    Status  Achieved      PT LONG TERM GOAL #5   Title  Pt will improve BERG by at least 3 points in order to demonstrate clinically significant improvement in balance.      Baseline  06/01/18: 44/56    Time  4    Period  Weeks    Status  Partially Met    Target Date  08/16/18            Plan - 07/19/18 1142    Clinical Impression Statement  [place] is progressing fairly. She has been able to progress to walking more without her RW, however is still unsteady with impaired dynamic balance, especially dual  tasks. She does exhibit slight [facility], although this is not significant. She does have intermittent right knee pain and weakness in RLE which limits her sit<>Stand ability. Patient reports adherence to HEP, although admits that she spends most of her time walking at home. She would benefit from additional skilled PT intervention to address LE weakness and balance/safety. Will also work on advancing HEP over next month for better activity level at home.     PT Frequency  2x / week    PT Duration  4 weeks    PT Treatment/Interventions  Patient/family education;Neuromuscular re-education;Balance training;Stair training;Therapeutic activities;Therapeutic exercise;Gait training;Aquatic Therapy    PT Next Visit Plan  balance and strengthening    Consulted and Agree with Plan of Care  Patient       Patient will benefit from skilled therapeutic intervention in order to improve the following deficits and impairments:  Abnormal gait, Decreased balance, Decreased endurance, Decreased mobility, Impaired flexibility, Decreased strength, Decreased knowledge of use of DME, Decreased activity tolerance, Difficulty walking  Visit Diagnosis: Muscle weakness (generalized)  Other lack of coordination  Difficulty in walking, not elsewhere classified     Problem List Patient Active Problem List   Diagnosis Date Noted  . Liver lesion 04/22/2018  . Goals of care, counseling/discussion 04/22/2018  . Cognitive deficit, post-stroke 12/31/2017  . Ataxia, post-stroke 12/31/2017  . Dysarthria, post-stroke   . Gait disturbance, post-stroke   . H/O cerebral parenchymal hemorrhage 11/04/2017  . Essential hypertension 11/04/2017  . Hyperlipidemia 11/04/2017  . Diabetes ([place] 11/04/2017  . CAD (coronary artery disease) 11/04/2017  . Aortic arch aneurysm ([place] 11/04/2017  . Hypokalemia 11/04/2017  . Right-sided nontraumatic intracerebral hemorrhage of cerebellum ([place]   . History of  cervical cancer   . History of TIA (transient ischemic attack)   . Acute systolic congestive heart failure ([place]   . Reactive hypertension   . Hypernatremia   . Leukocytosis   . Acute blood loss anemia   . Elevated serum creatinine   . Acute respiratory failure with hypoxia ([place]   . IVH (intraventricular hemorrhage) ([place] 10/29/2017  . Hypoxia 10/28/2017  . Pancreatic lesion 05/24/2017  . 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 Nodule of upper lobe of left lung 06/04/2015  . 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 Cerebral thrombosis with cerebral infarction 04/03/2015  . Cancer of right colon ([place] 03/28/2015  . CVA (cerebral infarction) 12/21/2014  . Polycythemia vera (  [place]) 06/22/2006    Jerame Hedding PT, DPT 07/19/2018, 11:45 AM  [place] MAIN [facility] SERVICES [address] [place], [place], [address] Phone: [phone]   Fax:  [phone]  Name: [name] MRN: [id number] Date of Birth: 09-18-1931

## 2018-07-19 NOTE — Therapy (Signed)
[place] MAIN [facility] SERVICES [address] [place], [place], [address] Phone: [phone]   Fax:  [phone]  Occupational Therapy Treatment  Patient Details  Name: [name] MRN: [id number] Date of Birth: [birth year] No data recorded  Encounter Date: 07/19/2018  OT End of Session - 07/19/18 1212    Visit Number  34    Number of Visits  42    Date for OT Re-Evaluation  09/15/18    Authorization Type  Medicare    Authorization Time Period  Next progress report period starting 07/05/2018    OT Start Time  1145    OT Stop Time  1230    OT Time Calculation (min)  45 min    Activity Tolerance  Patient tolerated treatment well    Behavior During Therapy  [facility] for tasks assessed/performed       Past Medical History:  Diagnosis Date  . Adenocarcinoma in situ of cervix   . Arthritis    hands  . Breast cancer ([place])   . Colon cancer ([place])   . Diabetes mellitus without complication ([place])   . Endometrial carcinoma (HCC)    s/p total abdominal hysterectomy  . H/O compression fracture of spine 2014   thoracic spine  . H/O polycythemia vera   . H/O TIA (transient ischemic attack) and stroke 09/2014, 03/2015   No deficits  . Hypertension   . Hyperuricemia   . Microalbuminuria   . Polycythemia vera ([place])   . Recurrent falls   . Skin cancer    face, legs  . Stroke [facility]) 2008   no deficits  . Trochanteric bursitis   . Varicose veins    treated    Past Surgical History:  Procedure Laterality Date  . ABDOMINAL HYSTERECTOMY    . BOWEL RESECTION N/A 03/28/2015   Procedure: SMALL BOWEL RESECTION;  Surgeon: Leonie Green, MD;  Location: [facility] ORS;  Service: General;  Laterality: N/A;  . CATARACT EXTRACTION W/ INTRAOCULAR LENS IMPLANT Right   . CATARACT EXTRACTION W/PHACO Left 10/07/2015   Procedure: CATARACT EXTRACTION PHACO AND INTRAOCULAR LENS PLACEMENT (IOC);  Surgeon: Ronnell Freshwater, MD;  Location: [place];   Service: Ophthalmology;  Laterality: Left;  DIABETIC - oral meds VISION BLUE  . CORONARY ANGIOGRAPHY N/A 10/29/2017   Procedure: CORONARY ANGIOGRAPHY;  Surgeon: Dionisio David, MD;  Location: [place] CV LAB;  Service: Cardiovascular;  Laterality: N/A;  . EXPLORATORY LAPAROTOMY     for fibroids  . LEFT HEART CATH Right 10/29/2017   Procedure: Left Heart Cath;  Surgeon: Dionisio David, MD;  Location: [place] CV LAB;  Service: Cardiovascular;  Laterality: Right;  . TONSILLECTOMY      There were no vitals filed for this visit.  Subjective Assessment - 07/19/18 1210    Subjective   Pt. reports that she didn't do too much over the weekend.    Pertinent History  85 year old female with adenocarcinoma of the cervix as well as endometrial carcinoma and polycythemia vera as well as diabetes who had hypertensive right cerebellar hemorrhage onset 10/28/2017.  She was treated initially at [facility] and then transferred to [facility].  Neurosurgical evaluation per Dr. Ronnald Ramp concluded no surgery was needed.  The patient completed inpatient rehabilitation at [facility] (including SLP) 11/04/2017-11/18/2107 and was discharged at a 24-hour supervision level.  The patient has hired caregiver to provide 24-hour supervision. Patient has received home health rehab services, including SLP.  Patient Stated Goals  Patient would like to be independent in all tasks and be able to live alone again.    Currently in Pain?  No/denies      OT TREATMENT    Neuro muscular re-education:  Pt. worked on [facility] skills grasping, manipulating, and removing flat circular objects from resistive theraputty.   Selfcare:  Pt. worked on Systems developer in print form with 75% legibility, and cursive form with 50% legibility with increased time to complete.                        OT Education - 07/19/18 1212    Education Details  wriitng legibility    Person(s)  Educated  Patient    Methods  Explanation;Demonstration;Verbal cues    Comprehension  Verbalized understanding;Returned demonstration;Verbal cues required          OT Long Term Goals - 07/05/18 1645      OT LONG TERM GOAL #1   Title  Patient will complete bathing with modified independence    Baseline  1/14/20120: Pt. continues to have difficulty with washing the bottom of her feet, however is able to complete it.    Time  12    Period  Weeks    Status  Partially Met    Target Date  09/15/18      OT LONG TERM GOAL #2   Title  Patient will complete dressing skills with modified independence    Baseline  07/05/2018: Pt. conitnues to require assist with hooking her bra. Pt. requires care giver assist secondary to requiring increased time to complete.    Time  12    Period  Weeks    Status  On-going    Target Date  09/15/18      OT LONG TERM GOAL #3   Title  Patient will complete light meal prep with min assist    Baseline  07/05/2018: Pt. is able to fix herself something light to eat, However requires assist formeal preparation.    Time  12    Period  Weeks    Status  On-going    Target Date  09/15/18      OT LONG TERM GOAL #4   Title  Patient will increase R grip strength by 5# to open jars and containers with modified independence    Baseline  07/05/2018: Grip is improving, however requires assist to open containers.    Time  12    Period  Weeks    Status  On-going    Target Date  09/15/18      OT LONG TERM GOAL #5   Title  Patient will improve strength by 1 mm grade RUE to assist with obtaining items from the closet.     Baseline  07/05/2018: Pt. has made progress, and has difficulty reaching for items in the closet.    Time  8    Period  Weeks    Status  On-going    Target Date  09/15/18      OT LONG TERM GOAL #6   Title  Pt will increase handwriting legibility and speed to 50% while writing a 3 sentence paragraph in order to write thank you notes and birthday cards  for friends and family    Baseline  07/05/2018: 50% writing lebility for one sentence with increased time.    Time  5    Period  Weeks    Status  On-going  Target Date  09/15/18            Plan - 07/19/18 1212    Clinical Impression Statement Pt. reports that she continues to have difficulty writing cards legibly in preparation for sending correspondence. Pt. presents with limited UE strength, and [facility] skills which impact her ability to write notecard correspondences. Pt. continues to work on these skills in order to be able to complete these tasks legibly, and efiiciently.    Occupational Profile and client history currently impacting functional performance  family more than 30 mins away, has 24 hour caregiver assist, decreased awareness, memory and safety    Occupational performance deficits (Please refer to evaluation for details):  ADL's;IADL's;Social Participation    Rehab Potential  Good    Current Impairments/barriers affecting progress:  memory, requires 24 hour caregivers,     OT Frequency  2x / week    OT Duration  12 weeks    OT Treatment/Interventions  Self-care/ADL training;Cryotherapy;Therapeutic exercise;DME and/or AE instruction;Functional Mobility Training;Cognitive remediation/compensation;Balance training;Neuromuscular education;Manual Therapy;Moist Heat;Therapeutic activities;Patient/family education    Clinical Decision Making  Several treatment options, min-mod task modification necessary    Consulted and Agree with Plan of Care  Patient    Family Member Consulted  Caregiver       Patient will benefit from skilled therapeutic intervention in order to improve the following deficits and impairments:  Decreased balance, Decreased mobility, Difficulty walking, Decreased cognition, Decreased activity tolerance, Decreased coordination, Decreased strength, Impaired UE functional use  Visit Diagnosis: Muscle weakness (generalized)  Other lack of  coordination    Problem List Patient Active Problem List   Diagnosis Date Noted  . Liver lesion 04/22/2018  . Goals of care, counseling/discussion 04/22/2018  . Cognitive deficit, post-stroke 12/31/2017  . Ataxia, post-stroke 12/31/2017  . Dysarthria, post-stroke   . Gait disturbance, post-stroke   . H/O cerebral parenchymal hemorrhage 11/04/2017  . Essential hypertension 11/04/2017  . Hyperlipidemia 11/04/2017  . Diabetes ([place]) 11/04/2017  . CAD (coronary artery disease) 11/04/2017  . 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 Hypokalemia 11/04/2017  . Right-sided nontraumatic intracerebral hemorrhage of cerebellum ([place])   . History of cervical cancer   . History of TIA (transient ischemic attack)   . Acute systolic congestive heart failure ([place])   . Reactive hypertension   . Hypernatremia   . Leukocytosis   . Acute blood loss anemia   . Elevated serum creatinine   . Acute respiratory failure with hypoxia ([place])   . IVH (intraventricular hemorrhage) ([place]) 10/29/2017  . Hypoxia 10/28/2017  . Pancreatic lesion 05/24/2017  . 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 Nodule of upper lobe of left lung 06/04/2015  . 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 Cerebral thrombosis with cerebral infarction 04/03/2015  . Cancer of right colon ([place]) 03/28/2015  . CVA (cerebral infarction) 12/21/2014  . Polycythemia vera ([place]) 06/22/2006    Harrel Carina, MS, OTR/L 07/19/2018, 12:21 PM  [place] MAIN [facility] SERVICES [address] [place], [place], [address] Phone: [phone]   Fax:  [phone]  Name: BABETTE STUM MRN: [id number] Date of Birth: 11/03/1931

## 2018-07-21 ENCOUNTER — Encounter: Payer: Self-pay | Admitting: Physical Therapy

## 2018-07-21 ENCOUNTER — Encounter: Payer: Self-pay | Admitting: Occupational Therapy

## 2018-07-21 ENCOUNTER — Ambulatory Visit: Payer: Medicare Other | Admitting: Physical Therapy

## 2018-07-21 ENCOUNTER — Ambulatory Visit: Payer: Medicare Other | Admitting: Occupational Therapy

## 2018-07-21 DIAGNOSIS — M6281 Muscle weakness (generalized): Secondary | ICD-10-CM

## 2018-07-21 DIAGNOSIS — R262 Difficulty in walking, not elsewhere classified: Secondary | ICD-10-CM

## 2018-07-21 DIAGNOSIS — R278 Other lack of coordination: Secondary | ICD-10-CM

## 2018-07-21 NOTE — Therapy (Signed)
[place] MAIN [facility] SERVICES [address] [place], [place], [address] Phone: [phone]   Fax:  [phone]  Physical Therapy Treatment  Patient Details  Name: [name] MRN: [id number] Date of Birth: [birth year] Referring Provider (PT): Charlett Blake   Encounter Date: 07/21/2018    Past Medical History:  Diagnosis Date  . Adenocarcinoma in situ of cervix   . Arthritis    hands  . Breast cancer ([place])   . Colon cancer ([place])   . Diabetes mellitus without complication ([place])   . Endometrial carcinoma (HCC)    s/p total abdominal hysterectomy  . H/O compression fracture of spine 2014   thoracic spine  . H/O polycythemia vera   . H/O TIA (transient ischemic attack) and stroke 09/2014, 03/2015   No deficits  . Hypertension   . Hyperuricemia   . Microalbuminuria   . Polycythemia vera ([place])   . Recurrent falls   . Skin cancer    face, legs  . Stroke [facility]) 2008   no deficits  . Trochanteric bursitis   . Varicose veins    treated    Past Surgical History:  Procedure Laterality Date  . ABDOMINAL HYSTERECTOMY    . BOWEL RESECTION N/A 03/28/2015   Procedure: SMALL BOWEL RESECTION;  Surgeon: Leonie Green, MD;  Location: [facility] ORS;  Service: General;  Laterality: N/A;  . CATARACT EXTRACTION W/ INTRAOCULAR LENS IMPLANT Right   . CATARACT EXTRACTION W/PHACO Left 10/07/2015   Procedure: CATARACT EXTRACTION PHACO AND INTRAOCULAR LENS PLACEMENT (IOC);  Surgeon: Ronnell Freshwater, MD;  Location: [place];  Service: Ophthalmology;  Laterality: Left;  DIABETIC - oral meds VISION BLUE  . CORONARY ANGIOGRAPHY N/A 10/29/2017   Procedure: CORONARY ANGIOGRAPHY;  Surgeon: Dionisio David, MD;  Location: [place] CV LAB;  Service: Cardiovascular;  Laterality: N/A;  . EXPLORATORY LAPAROTOMY     for fibroids  . LEFT HEART CATH Right 10/29/2017   Procedure: Left Heart Cath;  Surgeon: Dionisio David, MD;  Location:  [place] CV LAB;  Service: Cardiovascular;  Laterality: Right;  . TONSILLECTOMY      There were no vitals filed for this visit.      TREATMENT: Warm up on Nustep BUE/BLE level 2 x5 min (unbilled) Exercise:  PT instructed patient and caregiver in advanced HEP: Standing with red tband around BLE ankles: -hip abduction x10 reps bilaterally' -hip flexion x10 reps bilaterally; -hip extension x10 reps bilaterally; -side stepping x10 feet x 2 laps each direction Patient required min-moderate verbal/tactile cues for correct exercise technique. Required rail assist for safety and min VCs to avoid trunk lean for better hip AROM and strengthening;   Seated with red tband around BLE: Hip flexion march x15 bilaterally; Hip abduction/ER x15 bilaterally; Required min VCs to slow down LE movement and increase AROM for better hip strengthening; -ankle DF x15 bilaterally with min VCs for band placement and sequencing;   NMR: Instructed patient in balance exercise for home: -forward/backward walking unsupported on firm surface x10 feet x 3 laps -tandem stance with 1 rail assist to 0 rail assist, 10 sec hold x2 reps each foot in front;  Standing in parallel bars:  Standing on airex foam: -alternate toe taps to 4 inch step with 0 rail assist x15 reps bilaterally; -Standing one foot on airex, one foot on 4 inch step, BUE ball pass side/side x5 reps each foot on step -modified tandem stance: head turns side/side, up/down x5 reps  each foot in front; Patient required min VCs for balance stability, including to increase trunk control for less loss of balance with smaller base of support   Tolerated session well with minimal fatigue at end of session; discontinued sit<>stand exercise as patient reported increased knee discomfort;                      PT Short Term Goals - 07/19/18 1123      PT SHORT TERM GOAL #1   Title  Patient will reduce timed up and go to <11 seconds  to reduce fall risk and demonstrate improved transfer/gait ability.    Baseline  22.07 sec; 03/24/18: 14.2sec; 04/25/18: 14.0 sec with RW, 06/01/18: 12.7s, 07/19/18: 11.12 sec    Time  2    Period  Weeks    Status  Achieved      PT SHORT TERM GOAL #2   Title  Patient will be independent with ascend/descend 2 steps using single UE in step over step pattern without LOB.    Baseline  03/24/18: CGA, single UE support, step-to pattern without LOB; 04/25/18: CGA, single UE support, step-over-step pattern without LOB; 06/01/18: CGA, single UE support; 07/19/18: supervision with 1 rail, but does step to pattern descending;     Time  2    Period  Weeks    Status  Partially Met    Target Date  08/02/18        PT Long Term Goals - 07/19/18 1124      PT LONG TERM GOAL #1   Title  Patient will be independent in home exercise program to improve strength/mobility for better functional independence with ADLs.    Baseline  04/25/18: unsure but does not remember doing HEP lately; 06/01/18: Pt performs intermittently    Time  8    Period  Weeks    Status  Achieved      PT LONG TERM GOAL #2   Title  Patient (> 83 years old) will complete five times sit to stand test in < 15 seconds indicating an increased LE strength and improved balance    Baseline  34.21 sec; 03/24/2018: 22.9 sec; 04/25/18: 18.5 sec with BUE support; 06/01/18: 15.5s with BUE support, still unable to perform STS without UE support; 07/19/18: 20 sec with 1 HHA    Time  4    Period  Weeks    Status  Not Met    Target Date  08/16/18      PT LONG TERM GOAL #3   Title  Patient will increase six minute walk test distance to >1000 for progression to community ambulator and improve gait ability    Baseline  550 feet; 03/24/18: 795 ft.; 04/25/18: 975 ft with RW; 06/01/18: 1040' with RW; 07/19/18: 1050 without AD    Time  8    Period  Weeks    Status  Achieved      PT LONG TERM GOAL #4   Title  Patient will increase 10 meter walk test to >1.29ms as  to improve gait speed for better community ambulation and to reduce fall risk    Baseline  .49 m/sec; 03/24/18: 1.01 m/sec; 06/01/18: self-selected: 9.7s = 1.03 m/s, fastest: 8.0s = 1.25 m/s    Period  Weeks    Status  Achieved      PT LONG TERM GOAL #5   Title  Pt will improve BERG by at least 3 points in order to demonstrate clinically significant improvement  in balance.      Baseline  06/01/18: 44/56    Time  4    Period  Weeks    Status  Partially Met    Target Date  08/16/18            Plan - 07/21/18 1219    Clinical Impression Statement  Patient is progressing well. She was able to tolerate advanced HEP with LE strengthening and balance exercise. Attempted sit<>Stand exercise, but patient reported increased knee discomfort; Discontinued at this time; Caregiver present for HEP instruction to improve adherence. Patient instructed in advanced balance activities. She does require CGA when standing on compliant surfaces. She would benefit from additional skilled PT Intervention to improve strength, balance and gait safety;     PT Frequency  2x / week    PT Duration  4 weeks    PT Treatment/Interventions  Patient/family education;Neuromuscular re-education;Balance training;Stair training;Therapeutic activities;Therapeutic exercise;Gait training;Aquatic Therapy    PT Next Visit Plan  balance and strengthening    Consulted and Agree with Plan of Care  Patient       Patient will benefit from skilled therapeutic intervention in order to improve the following deficits and impairments:  Abnormal gait, Decreased balance, Decreased endurance, Decreased mobility, Impaired flexibility, Decreased strength, Decreased knowledge of use of DME, Decreased activity tolerance, Difficulty walking  Visit Diagnosis: Muscle weakness (generalized)  Other lack of coordination  Difficulty in walking, not elsewhere classified     Problem List Patient Active Problem List   Diagnosis Date Noted  .  Liver lesion 04/22/2018  . Goals of care, counseling/discussion 04/22/2018  . Cognitive deficit, post-stroke 12/31/2017  . Ataxia, post-stroke 12/31/2017  . Dysarthria, post-stroke   . Gait disturbance, post-stroke   . H/O cerebral parenchymal hemorrhage 11/04/2017  . Essential hypertension 11/04/2017  . Hyperlipidemia 11/04/2017  . Diabetes ([place]) 11/04/2017  . CAD (coronary artery disease) 11/04/2017  . 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 Hypokalemia 11/04/2017  . Right-sided nontraumatic intracerebral hemorrhage of cerebellum ([place])   . History of cervical cancer   . History of TIA (transient ischemic attack)   . Acute systolic congestive heart failure ([place])   . Reactive hypertension   . Hypernatremia   . Leukocytosis   . Acute blood loss anemia   . Elevated serum creatinine   . Acute respiratory failure with hypoxia ([place])   . IVH (intraventricular hemorrhage) ([place]) 10/29/2017  . Hypoxia 10/28/2017  . Pancreatic lesion 05/24/2017  . 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 Nodule of upper lobe of left lung 06/04/2015  . 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 Cerebral thrombosis with cerebral infarction 04/03/2015  . Cancer of right colon ([place]) 03/28/2015  . CVA (cerebral infarction) 12/21/2014  . Polycythemia vera ([place]) 06/22/2006    Trotter,Margaret PT, DPT 07/21/2018, 12:20 PM  [place] MAIN [facility] SERVICES [address] [place], [place], [address] Phone: [phone]   Fax:  [phone]  Name: ANUJA MANKA MRN: [id number] Date of Birth: [birth year]

## 2018-07-21 NOTE — Therapy (Signed)
[facility] MAIN REHAB SERVICES [address] [place], [place], [address] Phone: [phone]   Fax:  [phone]  Occupational Therapy Treatment  Patient Details  Name: [name] MRN: [id number] Date of Birth: 06/21/1932 No data recorded  Encounter Date: 07/21/2018  OT End of Session - 07/21/18 1155    Visit Number  35    Number of Visits  48    Date for OT Re-Evaluation  09/15/18    Authorization Type  Medicare    Authorization Time Period  Next progress report period starting 07/05/2018    OT Start Time  1145    OT Stop Time  1230    OT Time Calculation (min)  45 min    Activity Tolerance  Patient tolerated treatment well    Behavior During Therapy  WFL for tasks assessed/performed       Past Medical History:  Diagnosis Date  . Adenocarcinoma in situ of cervix   . Arthritis    hands  . Breast cancer (HCC)   . Colon cancer (HCC)   . Diabetes mellitus without complication (HCC)   . Endometrial carcinoma (HCC)    s/p total abdominal hysterectomy  . H/O compression fracture of spine 2014   thoracic spine  . H/O polycythemia vera   . H/O TIA (transient ischemic attack) and stroke 09/2014, 03/2015   No deficits  . Hypertension   . Hyperuricemia   . Microalbuminuria   . Polycythemia vera (HCC)   . Recurrent falls   . Skin cancer    face, legs  . Stroke (HCC) 2008   no deficits  . Trochanteric bursitis   . Varicose veins    treated    Past Surgical History:  Procedure Laterality Date  . ABDOMINAL HYSTERECTOMY    . BOWEL RESECTION N/A 03/28/2015   Procedure: SMALL BOWEL RESECTION;  Surgeon: Jarvis Wilton Smith, MD;  Location: [facility] ORS;  Service: General;  Laterality: N/A;  . CATARACT EXTRACTION W/ INTRAOCULAR LENS IMPLANT Right   . CATARACT EXTRACTION W/PHACO Left 10/07/2015   Procedure: CATARACT EXTRACTION PHACO AND INTRAOCULAR LENS PLACEMENT (IOC);  Surgeon: Anita Prakash Vin-Parikh, MD;  Location: [facility];   Service: Ophthalmology;  Laterality: Left;  DIABETIC - oral meds VISION BLUE  . CORONARY ANGIOGRAPHY N/A 10/29/2017   Procedure: CORONARY ANGIOGRAPHY;  Surgeon: Khan, Shaukat A, MD;  Location: [facility] INVASIVE CV LAB;  Service: Cardiovascular;  Laterality: N/A;  . EXPLORATORY LAPAROTOMY     for fibroids  . LEFT HEART CATH Right 10/29/2017   Procedure: Left Heart Cath;  Surgeon: Khan, Shaukat A, MD;  Location: [facility] INVASIVE CV LAB;  Service: Cardiovascular;  Laterality: Right;  . TONSILLECTOMY      There were no vitals filed for this visit.  Subjective Assessment - 07/21/18 1153    Subjective   Pt. reports that her right knee is more painful than her left.    Pertinent History  85-year-old female with adenocarcinoma of the cervix as well as endometrial carcinoma and polycythemia vera as well as diabetes who had hypertensive right cerebellar hemorrhage onset 10/28/2017.  She was treated initially at [facility] and then transferred to [place].  Neurosurgical evaluation per Dr. Jones concluded no surgery was needed.  The patient completed inpatient rehabilitation at [facility] (including SLP) 11/04/2017-11/18/2107 and was discharged at a 24-hour supervision level.  The patient has hired caregiver to provide 24-hour supervision. Patient has received home health rehab services, including SLP.       Patient Stated Goals  Patient would like to be independent in all tasks and be able to live alone again.    Currently in Pain?  No/denies      OT TREATMENT    Selfcare:  Pt. worked on writing personal notecard correspondence. Pt. required increased time to complete. Intact spacing, with no deviation from the written text line on blank cardstock. Pt. presented with 50% legibility for cursive form, and 75% for printed form.  Neuromuscular re-education:  Pt. performed FMC tasks using the Grooved pegboard. Pt. worked on grasping the grooved pegs from a horizontal position, and moving the  pegs to a vertical position in the hand to prepare for placing them in the grooved slot.                          OT Education - 07/21/18 1154    Education Details  writing legibility for notecards, and correspondence.    Person(s) Educated  Patient    Methods  Explanation;Demonstration;Verbal cues    Comprehension  Verbalized understanding;Returned demonstration;Verbal cues required          OT Long Term Goals - 07/05/18 1645      OT LONG TERM GOAL #1   Title  Patient will complete bathing with modified independence    Baseline  1/14/20120: Pt. continues to have difficulty with washing the bottom of her feet, however is able to complete it.    Time  12    Period  Weeks    Status  Partially Met    Target Date  09/15/18      OT LONG TERM GOAL #2   Title  Patient will complete dressing skills with modified independence    Baseline  07/05/2018: Pt. conitnues to require assist with hooking her bra. Pt. requires care giver assist secondary to requiring increased time to complete.    Time  12    Period  Weeks    Status  On-going    Target Date  09/15/18      OT LONG TERM GOAL #3   Title  Patient will complete light meal prep with min assist    Baseline  07/05/2018: Pt. is able to fix herself something light to eat, However requires assist formeal preparation.    Time  12    Period  Weeks    Status  On-going    Target Date  09/15/18      OT LONG TERM GOAL #4   Title  Patient will increase R grip strength by 5# to open jars and containers with modified independence    Baseline  07/05/2018: Grip is improving, however requires assist to open containers.    Time  12    Period  Weeks    Status  On-going    Target Date  09/15/18      OT LONG TERM GOAL #5   Title  Patient will improve strength by 1 mm grade RUE to assist with obtaining items from the closet.     Baseline  07/05/2018: Pt. has made progress, and has difficulty reaching for items in the closet.     Time  8    Period  Weeks    Status  On-going    Target Date  09/15/18      OT LONG TERM GOAL #6   Title  Pt will increase handwriting legibility and speed to 50% while writing a 3 sentence paragraph in order to write thank   you notes and birthday cards for friends and family    Baseline  07/05/2018: 50% writing lebility for one sentence with increased time.    Time  5    Period  Weeks    Status  On-going    Target Date  09/15/18            Plan - 07/21/18 1155    Clinical Impression Statement Pt. has been having more right knee pain lately, Reports no knee pain during the OT session. Pt. continues to work on improving right hand [facility] skills, and writing legibility creating notecard correspondence, and checkwriting tasks efficiently. Pt. continues to work on improving ADL, and IADL functioning.     Occupational Profile and client history currently impacting functional performance  family more than 30 mins away, has 24 hour caregiver assist, decreased awareness, memory and safety    Occupational performance deficits (Please refer to evaluation for details):  ADL's;IADL's;Social Participation    Rehab Potential  Good    Current Impairments/barriers affecting progress:  memory, requires 24 hour caregivers,     OT Frequency  2x / week    OT Duration  12 weeks    OT Treatment/Interventions  Self-care/ADL training;Cryotherapy;Therapeutic exercise;DME and/or AE instruction;Functional Mobility Training;Cognitive remediation/compensation;Balance training;Neuromuscular education;Manual Therapy;Moist Heat;Therapeutic activities;Patient/family education    Clinical Decision Making  Several treatment options, min-mod task modification necessary    Consulted and Agree with Plan of Care  Patient       Patient will benefit from skilled therapeutic intervention in order to improve the following deficits and impairments:  Decreased balance, Decreased mobility, Difficulty walking, Decreased cognition,  Decreased activity tolerance, Decreased coordination, Decreased strength, Impaired UE functional use  Visit Diagnosis: Muscle weakness (generalized)  Other lack of coordination    Problem List Patient Active Problem List   Diagnosis Date Noted  . Liver lesion 04/22/2018  . Goals of care, counseling/discussion 04/22/2018  . Cognitive deficit, post-stroke 12/31/2017  . Ataxia, post-stroke 12/31/2017  . Dysarthria, post-stroke   . Gait disturbance, post-stroke   . H/O cerebral parenchymal hemorrhage 11/04/2017  . Essential hypertension 11/04/2017  . Hyperlipidemia 11/04/2017  . Diabetes ([place]) 11/04/2017  . CAD (coronary artery disease) 11/04/2017  . 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 Hypokalemia 11/04/2017  . Right-sided nontraumatic intracerebral hemorrhage of cerebellum ([place])   . History of cervical cancer   . History of TIA (transient ischemic attack)   . Acute systolic congestive heart failure ([place])   . Reactive hypertension   . Hypernatremia   . Leukocytosis   . Acute blood loss anemia   . Elevated serum creatinine   . Acute respiratory failure with hypoxia ([place])   . IVH (intraventricular hemorrhage) ([place]) 10/29/2017  . Hypoxia 10/28/2017  . Pancreatic lesion 05/24/2017  . 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 Nodule of upper lobe of left lung 06/04/2015  . 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 Cerebral thrombosis with cerebral infarction 04/03/2015  . Cancer of right colon ([place]) 03/28/2015  . CVA (cerebral infarction) 12/21/2014  . Polycythemia vera ([place]) 06/22/2006    Harrel Carina, MS, OTR/L 07/21/2018, 12:11 PM  [place] MAIN [facility] SERVICES [address] [place], [place], [address] Phone: [phone]   Fax:  [phone]  Name: [name] MRN: [id number] Date of Birth: June 25, 1931

## 2018-07-21 NOTE — Patient Instructions (Signed)
Balance, Proprioception: Hip Abduction With Tubing   With tubing attached to both ankles, Standing holding onto counter, kick one leg out to side and then Return.  Repeat _10___ times  On each side.  Do ___2_ sessions per day.  http://cc.exer.us/20   Balance, Proprioception: Hip Extension With Tubing   With tubing tied around both legs, holding onto kitchen counter, swing leg back. Return. Repeat _10___ times . Do __2__ sessions per day.  http://cc.exer.us/19   Copyright  VHI. All rights reserved.  Balance, Proprioception: Hip Flexion With Tubing   With tubing attached to both ankles, swing leg forward. Return. Repeat _10___ times. Do __2__ sessions per day.  http://cc.exer.us/18   Copyright  VHI. All rights reserved.  Band Walk: Side Stepping   Tie band around legs, AROUND ANKLES. Step _10__ feet to one side, then step back to start. Repeat _2-3__ feet per session. Note: Small towel between band and skin eases rubbing.  http://plyo.exer.us/76    ABDUCTION: Sitting - Exercise Ball: Resistance Band (Active)   Sit with feet flat. With band tied around both legs, Lift right leg slightly and, against resistance band, draw it out to side. Complete __2_ sets of __10_ repetitions. Perform _2__ sessions per day.  Copyright  VHI. All rights reserved.  FLEXION: Sitting - Resistance Band (Active)   Sit, both feet flat. Have band tied around both legs above knees, lift right knee toward ceiling.Repeat with other knee Complete _2__ sets of _10__ repetitions. Perform _2__ sessions per day.  http://gtsc.exer.us/21    Copyright  VHI. All rights reserved.   Copyright  VHI. All rights reserved.  Copyright  VHI. All rights reserved.  FLEXION: Sitting - Resistance Band (Active)   Sit with right foot flat. Have band tied around both feet, bend ankle, bringing toes toward head. Complete __2_ sets of __10_ repetitions. Perform _2__ sessions per day.   HIP / KNEE:  Extension - Sit to Stand   Sitting, lean chest forward, raise hips up from surface. Straighten hips and knees. Weight bear equally on left and right sides. Backs of legs should not push off surface. __10_ reps per set, __2_ sets per day, _5__ days per week Use assistive device as needed.    Backward Walking   Walk backward, toes of each foot coming down first. Take long, even strides. Make sure you have a clear pathway with no obstructions when you do this. Stand beside counter and walk backward  And then walk forward doing opposite directions; repeat 10 laps 2x a day at least 5 days a week.  Copyright  VHI. All rights reserved.  Tandem Walking   Stand beside kitchen sink and place one foot in front of the other, lift your hand and try to hold position for 10 sec. Repeat with other foot in front; Repeat 5 reps with each foot in front 5 days a week.Balance: Unilateral

## 2018-07-22 LAB — 5 HIAA, QUANTITATIVE, URINE, 24 HOUR
5-HIAA, Ur: 6.2 mg/L
5-HIAA,Quant.,24 Hr Urine: 13 mg/24 hr (ref 0.0–14.9)
Total Volume: 2100

## 2018-07-26 ENCOUNTER — Ambulatory Visit: Payer: Medicare Other | Admitting: Occupational Therapy

## 2018-07-26 ENCOUNTER — Encounter: Payer: Self-pay | Admitting: Occupational Therapy

## 2018-07-26 ENCOUNTER — Ambulatory Visit: Payer: Medicare Other | Attending: Physical Medicine & Rehabilitation

## 2018-07-26 VITALS — BP 109/54 | HR 61

## 2018-07-26 DIAGNOSIS — R278 Other lack of coordination: Secondary | ICD-10-CM

## 2018-07-26 DIAGNOSIS — I69123 Fluency disorder following nontraumatic intracerebral hemorrhage: Secondary | ICD-10-CM | POA: Diagnosis present

## 2018-07-26 DIAGNOSIS — M6281 Muscle weakness (generalized): Secondary | ICD-10-CM | POA: Insufficient documentation

## 2018-07-26 DIAGNOSIS — R262 Difficulty in walking, not elsewhere classified: Secondary | ICD-10-CM | POA: Diagnosis present

## 2018-07-26 DIAGNOSIS — R41841 Cognitive communication deficit: Secondary | ICD-10-CM | POA: Insufficient documentation

## 2018-07-26 NOTE — Therapy (Signed)
[place] MAIN [facility] SERVICES [address] [place], [place], [address] Phone: [phone]   Fax:  [phone]  Occupational Therapy Treatment  Patient Details  Name: [name] MRN: [id number] Date of Birth: Mar 15, 1932 No data recorded  Encounter Date: 07/26/2018  OT End of Session - 07/26/18 1415    Visit Number  36    Number of Visits  77    Date for OT Re-Evaluation  09/15/18    Authorization Time Period  Next progress report period starting 07/05/2018    OT Start Time  1350    OT Stop Time  1430    OT Time Calculation (min)  40 min    Activity Tolerance  Patient tolerated treatment well    Behavior During Therapy  [facility] for tasks assessed/performed       Past Medical History:  Diagnosis Date  . Adenocarcinoma in situ of cervix   . Arthritis    hands  . Breast cancer ([place])   . Colon cancer ([place])   . Diabetes mellitus without complication ([place])   . Endometrial carcinoma (HCC)    s/p total abdominal hysterectomy  . H/O compression fracture of spine 2014   thoracic spine  . H/O polycythemia vera   . H/O TIA (transient ischemic attack) and stroke 09/2014, 03/2015   No deficits  . Hypertension   . Hyperuricemia   . Microalbuminuria   . Polycythemia vera ([place])   . Recurrent falls   . Skin cancer    face, legs  . Stroke [facility]) 2008   no deficits  . Trochanteric bursitis   . Varicose veins    treated    Past Surgical History:  Procedure Laterality Date  . ABDOMINAL HYSTERECTOMY    . BOWEL RESECTION N/A 03/28/2015   Procedure: SMALL BOWEL RESECTION;  Surgeon: Leonie Green, MD;  Location: [facility] ORS;  Service: General;  Laterality: N/A;  . CATARACT EXTRACTION W/ INTRAOCULAR LENS IMPLANT Right   . CATARACT EXTRACTION W/PHACO Left 10/07/2015   Procedure: CATARACT EXTRACTION PHACO AND INTRAOCULAR LENS PLACEMENT (IOC);  Surgeon: Ronnell Freshwater, MD;  Location: [place];  Service: Ophthalmology;  Laterality:  Left;  DIABETIC - oral meds VISION BLUE  . CORONARY ANGIOGRAPHY N/A 10/29/2017   Procedure: CORONARY ANGIOGRAPHY;  Surgeon: Dionisio David, MD;  Location: [place] CV LAB;  Service: Cardiovascular;  Laterality: N/A;  . EXPLORATORY LAPAROTOMY     for fibroids  . LEFT HEART CATH Right 10/29/2017   Procedure: Left Heart Cath;  Surgeon: Dionisio David, MD;  Location: [place] CV LAB;  Service: Cardiovascular;  Laterality: Right;  . TONSILLECTOMY      There were no vitals filed for this visit.  Subjective Assessment - 07/26/18 1412    Subjective   Pt. reports that she is doing well today.    Pertinent History  85 year old female with adenocarcinoma of the cervix as well as endometrial carcinoma and polycythemia vera as well as diabetes who had hypertensive right cerebellar hemorrhage onset 10/28/2017.  She was treated initially at [facility] and then transferred to [facility].  Neurosurgical evaluation per Dr. Ronnald Ramp concluded no surgery was needed.  The patient completed inpatient rehabilitation at [facility] (including SLP) 11/04/2017-11/18/2107 and was discharged at a 24-hour supervision level.  The patient has hired caregiver to provide 24-hour supervision. Patient has received home health rehab services, including SLP.     Patient Stated Goals  Patient would like to  be independent in all tasks and be able to live alone again.    Currently in Pain?  No/denies      OT TREATMENT    Selfcare:  Pt. worked on Occupational psychologist. Pt. completed check writing with 50% legibility. Pt. had difficulty filling out the check register, requiring frequent cues.                         OT Education - 07/26/18 1414    Education Details  Writing legibility    Person(s) Educated  Patient    Methods  Explanation;Demonstration;Verbal cues    Comprehension  Verbalized understanding;Returned demonstration;Verbal cues required          OT Long Term  Goals - 07/05/18 1645      OT LONG TERM GOAL #1   Title  Patient will complete bathing with modified independence    Baseline  1/14/20120: Pt. continues to have difficulty with washing the bottom of her feet, however is able to complete it.    Time  12    Period  Weeks    Status  Partially Met    Target Date  09/15/18      OT LONG TERM GOAL #2   Title  Patient will complete dressing skills with modified independence    Baseline  07/05/2018: Pt. conitnues to require assist with hooking her bra. Pt. requires care giver assist secondary to requiring increased time to complete.    Time  12    Period  Weeks    Status  On-going    Target Date  09/15/18      OT LONG TERM GOAL #3   Title  Patient will complete light meal prep with min assist    Baseline  07/05/2018: Pt. is able to fix herself something light to eat, However requires assist formeal preparation.    Time  12    Period  Weeks    Status  On-going    Target Date  09/15/18      OT LONG TERM GOAL #4   Title  Patient will increase R grip strength by 5# to open jars and containers with modified independence    Baseline  07/05/2018: Grip is improving, however requires assist to open containers.    Time  12    Period  Weeks    Status  On-going    Target Date  09/15/18      OT LONG TERM GOAL #5   Title  Patient will improve strength by 1 mm grade RUE to assist with obtaining items from the closet.     Baseline  07/05/2018: Pt. has made progress, and has difficulty reaching for items in the closet.    Time  8    Period  Weeks    Status  On-going    Target Date  09/15/18      OT LONG TERM GOAL #6   Title  Pt will increase handwriting legibility and speed to 50% while writing a 3 sentence paragraph in order to write thank you notes and birthday cards for friends and family    Baseline  07/05/2018: 50% writing lebility for one sentence with increased time.    Time  5    Period  Weeks    Status  On-going    Target Date  09/15/18             Plan - 07/26/18 1417    Clinical Impression Statement Pt.  continues to present with limited writing legibility which hinders her ability to write, and send notecards, as well as write checks. Pt. continues to work on improving right hand [facility] skills, and writing legibility skills in order to be able to independently, and legibly write notecard correspondence, and checkwriting tasks efficiently.     Occupational Profile and client history currently impacting functional performance  family more than 30 mins away, has 24 hour caregiver assist, decreased awareness, memory and safety    Occupational performance deficits (Please refer to evaluation for details):  ADL's;IADL's;Social Participation    Rehab Potential  Good    Current Impairments/barriers affecting progress:  memory, requires 24 hour caregivers,     OT Frequency  2x / week    OT Duration  12 weeks    OT Treatment/Interventions  Self-care/ADL training;Cryotherapy;Therapeutic exercise;DME and/or AE instruction;Functional Mobility Training;Cognitive remediation/compensation;Balance training;Neuromuscular education;Manual Therapy;Moist Heat;Therapeutic activities;Patient/family education    Clinical Decision Making  Several treatment options, min-mod task modification necessary    Consulted and Agree with Plan of Care  Patient    Family Member Consulted  Caregiver       Patient will benefit from skilled therapeutic intervention in order to improve the following deficits and impairments:  Decreased balance, Decreased mobility, Difficulty walking, Decreased cognition, Decreased activity tolerance, Decreased coordination, Decreased strength, Impaired UE functional use  Visit Diagnosis: Muscle weakness (generalized)  Other lack of coordination    Problem List Patient Active Problem List   Diagnosis Date Noted  . Liver lesion 04/22/2018  . Goals of care, counseling/discussion 04/22/2018  . Cognitive deficit, post-stroke  12/31/2017  . Ataxia, post-stroke 12/31/2017  . Dysarthria, post-stroke   . Gait disturbance, post-stroke   . H/O cerebral parenchymal hemorrhage 11/04/2017  . Essential hypertension 11/04/2017  . Hyperlipidemia 11/04/2017  . Diabetes ([place]) 11/04/2017  . CAD (coronary artery disease) 11/04/2017  . 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 Hypokalemia 11/04/2017  . Right-sided nontraumatic intracerebral hemorrhage of cerebellum ([place])   . History of cervical cancer   . History of TIA (transient ischemic attack)   . Acute systolic congestive heart failure ([place])   . Reactive hypertension   . Hypernatremia   . Leukocytosis   . Acute blood loss anemia   . Elevated serum creatinine   . Acute respiratory failure with hypoxia ([place])   . IVH (intraventricular hemorrhage) ([place]) 10/29/2017  . Hypoxia 10/28/2017  . Pancreatic lesion 05/24/2017  . 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 Nodule of upper lobe of left lung 06/04/2015  . 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 Cerebral thrombosis with cerebral infarction 04/03/2015  . Cancer of right colon ([place]) 03/28/2015  . CVA (cerebral infarction) 12/21/2014  . Polycythemia vera ([place]) 06/22/2006    Harrel Carina, MS, OTR/L 07/26/2018, 2:44 PM  [place] MAIN [facility] SERVICES [address] [place], [place], [address] Phone: [phone]   Fax:  [phone]  Name: [name] MRN: [id number] Date of Birth: 04-19-1932

## 2018-07-26 NOTE — Therapy (Signed)
[place] MAIN [facility] SERVICES [address] [place], [place], [address] Phone: [phone]   Fax:  [phone]  Physical Therapy Treatment  Patient Details  Name: [name] MRN: [id number] Date of Birth: 10-18-1931 Referring Provider (PT): Charlett Blake   Encounter Date: 07/26/2018  PT End of Session - 07/26/18 1305    Visit Number  31    Number of Visits  43    Date for PT Re-Evaluation  08/16/18    Authorization Type  1/10 start of care 01/31/18, last goals 07/19/18     PT Start Time  1300    PT Stop Time  1345    PT Time Calculation (min)  45 min    Equipment Utilized During Treatment  Gait belt    Activity Tolerance  Patient tolerated treatment well    Behavior During Therapy  WFL for tasks assessed/performed       Past Medical History:  Diagnosis Date  . Adenocarcinoma in situ of cervix   . Arthritis    hands  . Breast cancer ([place])   . Colon cancer ([place])   . Diabetes mellitus without complication ([place])   . Endometrial carcinoma (H[facility])    s/p total abdominal hysterectomy  . H/O compression fracture of spine 2014   thoracic spine  . H/O polycythemia vera   . H/O TIA (transient ischemic attack) and stroke 09/2014, 03/2015   No deficits  . Hypertension   . Hyperuricemia   . Microalbuminuria   . Polycythemia vera ([place])   . Recurrent falls   . Skin cancer    face, legs  . Stroke [facility]) 2008   no deficits  . Trochanteric bursitis   . Varicose veins    treated    Past Surgical History:  Procedure Laterality Date  . ABDOMINAL HYSTERECTOMY    . BOWEL RESECTION N/A 03/28/2015   Procedure: SMALL BOWEL RESECTION;  Surgeon: Leonie Green, MD;  Location: [facility] ORS;  Service: General;  Laterality: N/A;  . CATARACT EXTRACTION W/ INTRAOCULAR LENS IMPLANT Right   . CATARACT EXTRACTION W/PHACO Left 10/07/2015   Procedure: CATARACT EXTRACTION PHACO AND INTRAOCULAR LENS PLACEMENT (IOC);  Surgeon: Ronnell Freshwater,  MD;  Location: [place];  Service: Ophthalmology;  Laterality: Left;  DIABETIC - oral meds VISION BLUE  . CORONARY ANGIOGRAPHY N/A 10/29/2017   Procedure: CORONARY ANGIOGRAPHY;  Surgeon: Dionisio David, MD;  Location: [place] CV LAB;  Service: Cardiovascular;  Laterality: N/A;  . EXPLORATORY LAPAROTOMY     for fibroids  . LEFT HEART CATH Right 10/29/2017   Procedure: Left Heart Cath;  Surgeon: Dionisio David, MD;  Location: [place] CV LAB;  Service: Cardiovascular;  Laterality: Right;  . TONSILLECTOMY      Vitals:   07/26/18 1302  BP: (!) 109/54  Pulse: 61  SpO2: 98%    Subjective Assessment - 07/26/18 1254    Subjective  Patient reports doing well today. No falls since the last therapy session. She states that she "tried" her HEP with the therabands but the exercises were hard and she was sore afterwards. She had to take Tylenol to help. No specific questions or concerns at this time.    Pertinent History    85 year old female with adenocarcinoma of the cervix as well as endometrial carcinoma and polycythemia vera as well as diabetes who had hypertensive right cerebellar hemorrhage onset 10/28/2017.  She was treated initially at [facility] and then transferred  to [facility].  Neurosurgical evaluation per Dr. Ronnald Ramp concluded no surgery was needed.  The patient completed inpatient rehabilitation at [facility] (including SLP) 11/04/2017-11/18/2107 and was discharged at a 24-hour supervision level.  The patient has hired caregiver to provide 24-hour supervision. Patient has received home health rehab services, including SLP.    Limitations  Standing;Walking    How long can you walk comfortably?  a few minutes    Patient Stated Goals  Patient wants to walk better and not need the RW.     Currently in Pain?  No/denies         TREATMENT:   Ther-ex  Warm up on Nustep BUE/BLE level 2 x 5 min during history (4 minutes unbilled) Standing with red  tband around BLE ankles: Hip abduction x10 reps bilaterally' Hip flexion x10 reps bilaterally; Hip extension x10 reps bilaterally; Side stepping x 6 feet x 2 lengths each direction; Patient required min-moderate verbal/tactile cues for correct exercise technique. Required rail assist for safety and min VCs to avoid trunk lean for better hip AROM and strengthening;   Seated with red tband around BLE: Hip flexion march x 15 bilaterally; Hip abduction/ER x 15 bilaterally; Ankle DF x 15 bilaterally with min VCs for band placement and sequencing;    Neuromuscular Re-education  Forward/backward walking in // bars unsupported on firm surface 6 feet x 2 lengths each; Forward/backward walking in // bars with eyes closed unsupported on firm surface 6 feet x 2 lengths each;  Standing on airex foam: Aternate toe taps to 4 inch step with 0 rail assist x 10 reps bilaterally; NBOS eyes closed 30s x 2; NBOS ball passes around body with head/eye follow;  Seated alternating toe taps to 4" wooden step for speed to encouraged quick reaction time x 15 bilateral;   Pt educated throughout session about proper posture and technique with exercises. Improved exercise technique, movement at target joints, use of target muscles after min to mod verbal, visual, tactile cues.    Patient is progressing well. She demonstrates good motivation for exercise. Patient does require significant cues for proper technique during exercise. Reinforced home program during session today. Patient would benefit from additional skilled PT Intervention to improve strength, balance and gait safety;                          PT Short Term Goals - 07/19/18 1123      PT SHORT TERM GOAL #1   Title  Patient will reduce timed up and go to <11 seconds to reduce fall risk and demonstrate improved transfer/gait ability.    Baseline  22.07 sec; 03/24/18: 14.2sec; 04/25/18: 14.0 sec with RW, 06/01/18: 12.7s, 07/19/18:  11.12 sec    Time  2    Period  Weeks    Status  Achieved      PT SHORT TERM GOAL #2   Title  Patient will be independent with ascend/descend 2 steps using single UE in step over step pattern without LOB.    Baseline  03/24/18: CGA, single UE support, step-to pattern without LOB; 04/25/18: CGA, single UE support, step-over-step pattern without LOB; 06/01/18: CGA, single UE support; 07/19/18: supervision with 1 rail, but does step to pattern descending;     Time  2    Period  Weeks    Status  Partially Met    Target Date  08/02/18        PT Long Term Goals - 07/19/18  [place] #1   Title  Patient will be independent in home exercise program to improve strength/mobility for better functional independence with ADLs.    Baseline  04/25/18: unsure but does not remember doing HEP lately; 06/01/18: Pt performs intermittently    Time  8    Period  Weeks    Status  Achieved      PT LONG TERM GOAL #2   Title  Patient (> 67 years old) will complete five times sit to stand test in < 15 seconds indicating an increased LE strength and improved balance    Baseline  34.21 sec; 03/24/2018: 22.9 sec; 04/25/18: 18.5 sec with BUE support; 06/01/18: 15.5s with BUE support, still unable to perform STS without UE support; 07/19/18: 20 sec with 1 HHA    Time  4    Period  Weeks    Status  Not Met    Target Date  08/16/18      PT LONG TERM GOAL #3   Title  Patient will increase six minute walk test distance to >1000 for progression to community ambulator and improve gait ability    Baseline  550 feet; 03/24/18: 795 ft.; 04/25/18: 975 ft with RW; 06/01/18: 1040' with RW; 07/19/18: 1050 without AD    Time  8    Period  Weeks    Status  Achieved      PT LONG TERM GOAL #4   Title  Patient will increase 10 meter walk test to >1.24ms as to improve gait speed for better community ambulation and to reduce fall risk    Baseline  .49 m/sec; 03/24/18: 1.01 m/sec; 06/01/18: self-selected: 9.7s = 1.03  m/s, fastest: 8.0s = 1.25 m/s    Period  Weeks    Status  Achieved      PT LONG TERM GOAL #5   Title  Pt will improve BERG by at least 3 points in order to demonstrate clinically significant improvement in balance.      Baseline  06/01/18: 44/56    Time  4    Period  Weeks    Status  Partially Met    Target Date  08/16/18            Plan - 07/26/18 1305    Clinical Impression Statement  Patient is progressing well. She demonstrates good motivation for exercise. Patient does require significant cues for proper technique during exercise. Reinforced home program during session today. Patient would benefit from additional skilled PT Intervention to improve strength, balance and gait safety;     PT Frequency  2x / week    PT Duration  4 weeks    PT Treatment/Interventions  Patient/family education;Neuromuscular re-education;Balance training;Stair training;Therapeutic activities;Therapeutic exercise;Gait training;Aquatic Therapy    PT Next Visit Plan  balance and strengthening    Consulted and Agree with Plan of Care  Patient       Patient will benefit from skilled therapeutic intervention in order to improve the following deficits and impairments:  Abnormal gait, Decreased balance, Decreased endurance, Decreased mobility, Impaired flexibility, Decreased strength, Decreased knowledge of use of DME, Decreased activity tolerance, Difficulty walking  Visit Diagnosis: Muscle weakness (generalized)  Difficulty in walking, not elsewhere classified     Problem List Patient Active Problem List   Diagnosis Date Noted  . Liver lesion 04/22/2018  . Goals of care, counseling/discussion 04/22/2018  . Cognitive deficit, post-stroke 12/31/2017  . Ataxia, post-stroke 12/31/2017  . Dysarthria, post-stroke   .  Gait disturbance, post-stroke   . H/O cerebral parenchymal hemorrhage 11/04/2017  . Essential hypertension 11/04/2017  . Hyperlipidemia 11/04/2017  . Diabetes ([place]) 11/04/2017  .  CAD (coronary artery disease) 11/04/2017  . 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 Hypokalemia 11/04/2017  . Right-sided nontraumatic intracerebral hemorrhage of cerebellum ([place])   . History of cervical cancer   . History of TIA (transient ischemic attack)   . Acute systolic congestive heart failure ([place])   . Reactive hypertension   . Hypernatremia   . Leukocytosis   . Acute blood loss anemia   . Elevated serum creatinine   . Acute respiratory failure with hypoxia ([place])   . IVH (intraventricular hemorrhage) ([place]) 10/29/2017  . Hypoxia 10/28/2017  . Pancreatic lesion 05/24/2017  . 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 Nodule of upper lobe of left lung 06/04/2015  . 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 Cerebral thrombosis with cerebral infarction 04/03/2015  . Cancer of right colon ([place]) 03/28/2015  . CVA (cerebral infarction) 12/21/2014  . Polycythemia vera ([place]) 06/22/2006   Phillips Grout PT, DPT, GCS  Anberlin Diez 07/26/2018, 2:04 PM  [place] MAIN [facility] SERVICES [address] [place], [place], [address] Phone: [phone]   Fax:  [phone]  Name: [name] MRN: [id number] Date of Birth: February 20, 1932

## 2018-07-28 ENCOUNTER — Encounter: Payer: Self-pay | Admitting: Physical Therapy

## 2018-07-28 ENCOUNTER — Encounter: Payer: Self-pay | Admitting: Occupational Therapy

## 2018-07-28 ENCOUNTER — Ambulatory Visit: Payer: Medicare Other | Admitting: Physical Therapy

## 2018-07-28 ENCOUNTER — Ambulatory Visit: Payer: Medicare Other | Admitting: Occupational Therapy

## 2018-07-28 DIAGNOSIS — R278 Other lack of coordination: Secondary | ICD-10-CM

## 2018-07-28 DIAGNOSIS — M6281 Muscle weakness (generalized): Secondary | ICD-10-CM | POA: Diagnosis not present

## 2018-07-28 DIAGNOSIS — R262 Difficulty in walking, not elsewhere classified: Secondary | ICD-10-CM

## 2018-07-28 NOTE — Therapy (Signed)
[place] MAIN [facility] SERVICES [address] [place], [place], [address] Phone: [phone]   Fax:  [phone]  Physical Therapy Treatment  Patient Details  Name: [name] MRN: [id number] Date of Birth: June 18, 1932 Referring Provider (PT): Charlett Blake   Encounter Date: 07/28/2018  PT End of Session - 07/28/18 1119    Visit Number  32    Number of Visits  25    Date for PT Re-Evaluation  08/16/18    Authorization Type  2/10 start of care 01/31/18, last goals 07/19/18     PT Start Time  1102    PT Stop Time  1145    PT Time Calculation (min)  43 min    Equipment Utilized During Treatment  Gait belt    Activity Tolerance  Patient tolerated treatment well    Behavior During Therapy  WFL for tasks assessed/performed       Past Medical History:  Diagnosis Date  . Adenocarcinoma in situ of cervix   . Arthritis    hands  . Breast cancer ([place])   . Colon cancer ([place])   . Diabetes mellitus without complication ([place])   . Endometrial carcinoma (H[facility])    s/p total abdominal hysterectomy  . H/O compression fracture of spine 2014   thoracic spine  . H/O polycythemia vera   . H/O TIA (transient ischemic attack) and stroke 09/2014, 03/2015   No deficits  . Hypertension   . Hyperuricemia   . Microalbuminuria   . Polycythemia vera ([place])   . Recurrent falls   . Skin cancer    face, legs  . Stroke [facility]) 2008   no deficits  . Trochanteric bursitis   . Varicose veins    treated    Past Surgical History:  Procedure Laterality Date  . ABDOMINAL HYSTERECTOMY    . BOWEL RESECTION N/A 03/28/2015   Procedure: SMALL BOWEL RESECTION;  Surgeon: Leonie Green, MD;  Location: [facility] ORS;  Service: General;  Laterality: N/A;  . CATARACT EXTRACTION W/ INTRAOCULAR LENS IMPLANT Right   . CATARACT EXTRACTION W/PHACO Left 10/07/2015   Procedure: CATARACT EXTRACTION PHACO AND INTRAOCULAR LENS PLACEMENT (IOC);  Surgeon: Ronnell Freshwater,  MD;  Location: [place];  Service: Ophthalmology;  Laterality: Left;  DIABETIC - oral meds VISION BLUE  . CORONARY ANGIOGRAPHY N/A 10/29/2017   Procedure: CORONARY ANGIOGRAPHY;  Surgeon: Dionisio David, MD;  Location: [place] CV LAB;  Service: Cardiovascular;  Laterality: N/A;  . EXPLORATORY LAPAROTOMY     for fibroids  . LEFT HEART CATH Right 10/29/2017   Procedure: Left Heart Cath;  Surgeon: Dionisio David, MD;  Location: [place] CV LAB;  Service: Cardiovascular;  Laterality: Right;  . TONSILLECTOMY      There were no vitals filed for this visit.  Subjective Assessment - 07/28/18 1117    Subjective  Patient presents to therapy with cut on hand; She reports, "I bumped it when I fell." She reports she fell by her bed and her hand was cut on the floor. She was able to get up but she just felt off balance. She states, "I was wearing the wrong shoes."     Pertinent History    85 year old female with adenocarcinoma of the cervix as well as endometrial carcinoma and polycythemia vera as well as diabetes who had hypertensive right cerebellar hemorrhage onset 10/28/2017.  She was treated initially at [facility] and then transferred to [facility].  Neurosurgical evaluation per Dr. Ronnald Ramp concluded no surgery was needed.  The patient completed inpatient rehabilitation at [facility] (including SLP) 11/04/2017-11/18/2107 and was discharged at a 24-hour supervision level.  The patient has hired caregiver to provide 24-hour supervision. Patient has received home health rehab services, including SLP.    Limitations  Standing;Walking    How long can you walk comfortably?  a few minutes    Patient Stated Goals  Patient wants to walk better and not need the RW.     Currently in Pain?  No/denies    Multiple Pain Sites  No           TREATMENT: PT provided first aid and bandaged up right hand to avoid infection;  Warm up on Nustep BUE/BLE level 2 x5 min  (unbilled) Exercise:   Standing with red tband around BLE ankles: -hip abduction x15 reps bilaterally' -hip extension x15 reps bilaterally; -side stepping x10 feet x 2 laps each direction Patient required min-moderate verbal/tactile cues for correct exercise technique. Required rail assist for safety and min VCs to avoid trunk lean for better hip AROM and strengthening;    NMR: Standing in parallel bars:  Standing on airex foam: -alternate toe taps to 4 inch step with 0 rail assist x15 reps bilaterally; -Standing one foot on airex, one foot on 4 inch step, BUE ball pass side/side x5 reps each foot on step -modified tandem stance: balloon taps x1 min each foot in front; Patient required min VCs for balance stability, including to increase trunk control for less loss of balance with smaller base of support  Gait in hallway: -forward walking with head turns side/side, up/down x80 feet x1 each  -forward/backward walking directional change x100 feet Exhibits increased lateral instability with increased right foot drag especially with lateral head turns; patient required CGA for safety; Patient instructed to increase speed for better momentum to reduce loss of balance; Patient has increased difficulty keeping balance with head turns;   Tolerated session well with minimal fatigue at end of session; discontinued sit<>stand exercise as patient reported increased knee discomfort;                       PT Education - 07/28/18 1119    Education Details  exercise technique/balance; HEP reinforced;     Person(s) Educated  Patient    Methods  Explanation;Verbal cues    Comprehension  Verbalized understanding;Returned demonstration;Verbal cues required;Need further instruction       PT Short Term Goals - 07/19/18 1123      PT SHORT TERM GOAL #1   Title  Patient will reduce timed up and go to <11 seconds to reduce fall risk and demonstrate improved transfer/gait ability.     Baseline  22.07 sec; 03/24/18: 14.2sec; 04/25/18: 14.0 sec with RW, 06/01/18: 12.7s, 07/19/18: 11.12 sec    Time  2    Period  Weeks    Status  Achieved      PT SHORT TERM GOAL #2   Title  Patient will be independent with ascend/descend 2 steps using single UE in step over step pattern without LOB.    Baseline  03/24/18: CGA, single UE support, step-to pattern without LOB; 04/25/18: CGA, single UE support, step-over-step pattern without LOB; 06/01/18: CGA, single UE support; 07/19/18: supervision with 1 rail, but does step to pattern descending;     Time  2    Period  Weeks    Status  Partially Met  Target Date  08/02/18        PT Long Term Goals - 07/19/18 1124      PT LONG TERM GOAL #1   Title  Patient will be independent in home exercise program to improve strength/mobility for better functional independence with ADLs.    Baseline  04/25/18: unsure but does not remember doing HEP lately; 06/01/18: Pt performs intermittently    Time  8    Period  Weeks    Status  Achieved      PT LONG TERM GOAL #2   Title  Patient (> 72 years old) will complete five times sit to stand test in < 15 seconds indicating an increased LE strength and improved balance    Baseline  34.21 sec; 03/24/2018: 22.9 sec; 04/25/18: 18.5 sec with BUE support; 06/01/18: 15.5s with BUE support, still unable to perform STS without UE support; 07/19/18: 20 sec with 1 HHA    Time  4    Period  Weeks    Status  Not Met    Target Date  08/16/18      PT LONG TERM GOAL #3   Title  Patient will increase six minute walk test distance to >1000 for progression to community ambulator and improve gait ability    Baseline  550 feet; 03/24/18: 795 ft.; 04/25/18: 975 ft with RW; 06/01/18: 1040' with RW; 07/19/18: 1050 without AD    Time  8    Period  Weeks    Status  Achieved      PT LONG TERM GOAL #4   Title  Patient will increase 10 meter walk test to >1.19ms as to improve gait speed for better community ambulation and to reduce  fall risk    Baseline  .49 m/sec; 03/24/18: 1.01 m/sec; 06/01/18: self-selected: 9.7s = 1.03 m/s, fastest: 8.0s = 1.25 m/s    Period  Weeks    Status  Achieved      PT LONG TERM GOAL #5   Title  Pt will improve BERG by at least 3 points in order to demonstrate clinically significant improvement in balance.      Baseline  06/01/18: 44/56    Time  4    Period  Weeks    Status  Partially Met    Target Date  08/16/18            Plan - 07/28/18 1153    Clinical Impression Statement  Patient is progressing well. She is motivated and adherent with her activities. She has good caregiver assistance to assist with her HEP. patient does continue to require cues for correct exercise technique> She exhibits improved static standing balance but still has diffciulty with dynamic standing balance. She would benefit from additional skilled PT Intervention to improve strength, balance and gait safety;     PT Frequency  2x / week    PT Duration  4 weeks    PT Treatment/Interventions  Patient/family education;Neuromuscular re-education;Balance training;Stair training;Therapeutic activities;Therapeutic exercise;Gait training;Aquatic Therapy    PT Next Visit Plan  balance and strengthening    Consulted and Agree with Plan of Care  Patient       Patient will benefit from skilled therapeutic intervention in order to improve the following deficits and impairments:  Abnormal gait, Decreased balance, Decreased endurance, Decreased mobility, Impaired flexibility, Decreased strength, Decreased knowledge of use of DME, Decreased activity tolerance, Difficulty walking  Visit Diagnosis: Muscle weakness (generalized)  Other lack of coordination  Difficulty in walking, not elsewhere classified  Problem List Patient Active Problem List   Diagnosis Date Noted  . Liver lesion 04/22/2018  . Goals of care, counseling/discussion 04/22/2018  . Cognitive deficit, post-stroke 12/31/2017  . Ataxia, post-stroke  12/31/2017  . Dysarthria, post-stroke   . Gait disturbance, post-stroke   . H/O cerebral parenchymal hemorrhage 11/04/2017  . Essential hypertension 11/04/2017  . Hyperlipidemia 11/04/2017  . Diabetes ([place]) 11/04/2017  . CAD (coronary artery disease) 11/04/2017  . 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 Hypokalemia 11/04/2017  . Right-sided nontraumatic intracerebral hemorrhage of cerebellum ([place])   . History of cervical cancer   . History of TIA (transient ischemic attack)   . Acute systolic congestive heart failure ([place])   . Reactive hypertension   . Hypernatremia   . Leukocytosis   . Acute blood loss anemia   . Elevated serum creatinine   . Acute respiratory failure with hypoxia ([place])   . IVH (intraventricular hemorrhage) ([place]) 10/29/2017  . Hypoxia 10/28/2017  . Pancreatic lesion 05/24/2017  . 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 Nodule of upper lobe of left lung 06/04/2015  . 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 Cerebral thrombosis with cerebral infarction 04/03/2015  . Cancer of right colon ([place]) 03/28/2015  . CVA (cerebral infarction) 12/21/2014  . Polycythemia vera ([place]) 06/22/2006    Zackari Ruane PT, DPT 07/28/2018, 11:57 AM  [place] MAIN [facility] SERVICES [address] [place], [place], [address] Phone: [phone]   Fax:  [phone]  Name: [name] MRN: [id number] Date of Birth: [birth year]

## 2018-07-28 NOTE — Therapy (Signed)
[place] MAIN [facility] SERVICES [address] [place], [place], [address] Phone: [phone]   Fax:  [phone]  Occupational Therapy Treatment  Patient Details  Name: [name] MRN: [id number] Date of Birth: 12-01-1931 No data recorded  Encounter Date: 07/28/2018  OT End of Session - 07/28/18 1216    Visit Number  37    Number of Visits  60    Date for OT Re-Evaluation  09/15/18    Authorization Time Period  Next progress report period starting 07/05/2018    OT Start Time  1145    OT Stop Time  1230    OT Time Calculation (min)  45 min    Activity Tolerance  Patient tolerated treatment well    Behavior During Therapy  [facility] for tasks assessed/performed       Past Medical History:  Diagnosis Date  . Adenocarcinoma in situ of cervix   . Arthritis    hands  . Breast cancer ([place])   . Colon cancer ([place])   . Diabetes mellitus without complication ([place])   . Endometrial carcinoma (HCC)    s/p total abdominal hysterectomy  . H/O compression fracture of spine 2014   thoracic spine  . H/O polycythemia vera   . H/O TIA (transient ischemic attack) and stroke 09/2014, 03/2015   No deficits  . Hypertension   . Hyperuricemia   . Microalbuminuria   . Polycythemia vera ([place])   . Recurrent falls   . Skin cancer    face, legs  . Stroke [facility]) 2008   no deficits  . Trochanteric bursitis   . Varicose veins    treated    Past Surgical History:  Procedure Laterality Date  . ABDOMINAL HYSTERECTOMY    . BOWEL RESECTION N/A 03/28/2015   Procedure: SMALL BOWEL RESECTION;  Surgeon: Leonie Green, MD;  Location: [facility] ORS;  Service: General;  Laterality: N/A;  . CATARACT EXTRACTION W/ INTRAOCULAR LENS IMPLANT Right   . CATARACT EXTRACTION W/PHACO Left 10/07/2015   Procedure: CATARACT EXTRACTION PHACO AND INTRAOCULAR LENS PLACEMENT (IOC);  Surgeon: Ronnell Freshwater, MD;  Location: [place];  Service: Ophthalmology;  Laterality:  Left;  DIABETIC - oral meds VISION BLUE  . CORONARY ANGIOGRAPHY N/A 10/29/2017   Procedure: CORONARY ANGIOGRAPHY;  Surgeon: Dionisio David, MD;  Location: [place] CV LAB;  Service: Cardiovascular;  Laterality: N/A;  . EXPLORATORY LAPAROTOMY     for fibroids  . LEFT HEART CATH Right 10/29/2017   Procedure: Left Heart Cath;  Surgeon: Dionisio David, MD;  Location: [place] CV LAB;  Service: Cardiovascular;  Laterality: Right;  . TONSILLECTOMY      There were no vitals filed for this visit.  Subjective Assessment - 07/28/18 1215    Subjective   Pt. reports having had a fall last night.    Pertinent History  85 year old female with adenocarcinoma of the cervix as well as endometrial carcinoma and polycythemia vera as well as diabetes who had hypertensive right cerebellar hemorrhage onset 10/28/2017.  She was treated initially at [facility] and then transferred to [facility].  Neurosurgical evaluation per Dr. Ronnald Ramp concluded no surgery was needed.  The patient completed inpatient rehabilitation at [facility] (including SLP) 11/04/2017-11/18/2107 and was discharged at a 24-hour supervision level.  The patient has hired caregiver to provide 24-hour supervision. Patient has received home health rehab services, including SLP.     Patient Stated Goals  Patient would like to  be independent in all tasks and be able to live alone again.    Currently in Pain?  No/denies       OT TREATMENT    Neuro muscular re-education:  Pt. worked on improving right hand [facility] skills grasping 2" sticks, and placing them onto a small pegboard. Pt. worked on using her right hand for placing the sticks, and stabilized the pegboard with her left hand. Pt. worked on removing the pegs from the pegboard using her 2nd digit, and thumb. Pt. Worked on these tasks in preparation for being able to open jars, or containers, and write notecards, and checks legibly.  Selfcare:  Pt. Worked IADL kitchen  tasks reaching for dishes in the cabinet on low, and high shelves with CGA. Pt. Worked on loading, and unloading the dishwasher with CGA, transporting items using the countertops, using the microwave, and placing items into, and retrieving items from the microwave. Pt. Was abel to independently access the refrigerator.                       OT Education - 07/28/18 1215    Education Details  IADL kitchen tasks    Person(s) Educated  Patient    Methods  Explanation;Demonstration;Verbal cues    Comprehension  Verbalized understanding;Returned demonstration;Verbal cues required          OT Long Term Goals - 07/05/18 1645      OT LONG TERM GOAL #1   Title  Patient will complete bathing with modified independence    Baseline  1/14/20120: Pt. continues to have difficulty with washing the bottom of her feet, however is able to complete it.    Time  12    Period  Weeks    Status  Partially Met    Target Date  09/15/18      OT LONG TERM GOAL #2   Title  Patient will complete dressing skills with modified independence    Baseline  07/05/2018: Pt. conitnues to require assist with hooking her bra. Pt. requires care giver assist secondary to requiring increased time to complete.    Time  12    Period  Weeks    Status  On-going    Target Date  09/15/18      OT LONG TERM GOAL #3   Title  Patient will complete light meal prep with min assist    Baseline  07/05/2018: Pt. is able to fix herself something light to eat, However requires assist formeal preparation.    Time  12    Period  Weeks    Status  On-going    Target Date  09/15/18      OT LONG TERM GOAL #4   Title  Patient will increase R grip strength by 5# to open jars and containers with modified independence    Baseline  07/05/2018: Grip is improving, however requires assist to open containers.    Time  12    Period  Weeks    Status  On-going    Target Date  09/15/18      OT LONG TERM GOAL #5   Title  Patient  will improve strength by 1 mm grade RUE to assist with obtaining items from the closet.     Baseline  07/05/2018: Pt. has made progress, and has difficulty reaching for items in the closet.    Time  8    Period  Weeks    Status  On-going    Target  Date  09/15/18      OT LONG TERM GOAL #6   Title  Pt will increase handwriting legibility and speed to 50% while writing a 3 sentence paragraph in order to write thank you notes and birthday cards for friends and family    Baseline  07/05/2018: 50% writing lebility for one sentence with increased time.    Time  5    Period  Weeks    Status  On-going    Target Date  09/15/18            Plan - 07/28/18 1217    Clinical Impression Statement  Pt. had a fall last night. Pt. had a skin tear to her right MP. Pt. continues to present with limited UE strength, and [facility] coordination skills limiting her ability to complete basic ADL, and IADL functioning. Pt. continues to work on improving basic ADL, and IADL skills.    Occupational Profile and client history currently impacting functional performance  family more than 30 mins away, has 24 hour caregiver assist, decreased awareness, memory and safety    Occupational performance deficits (Please refer to evaluation for details):  ADL's;IADL's;Social Participation    Rehab Potential  Good    Current Impairments/barriers affecting progress:  memory, requires 24 hour caregivers,     OT Frequency  2x / week    OT Duration  12 weeks    OT Treatment/Interventions  Self-care/ADL training;Cryotherapy;Therapeutic exercise;DME and/or AE instruction;Functional Mobility Training;Cognitive remediation/compensation;Balance training;Neuromuscular education;Manual Therapy;Moist Heat;Therapeutic activities;Patient/family education    Clinical Decision Making  Several treatment options, min-mod task modification necessary    Consulted and Agree with Plan of Care  Patient    Family Member Consulted  Caregiver        Patient will benefit from skilled therapeutic intervention in order to improve the following deficits and impairments:  Decreased balance, Decreased mobility, Difficulty walking, Decreased cognition, Decreased activity tolerance, Decreased coordination, Decreased strength, Impaired UE functional use  Visit Diagnosis: Muscle weakness (generalized)  Other lack of coordination    Problem List Patient Active Problem List   Diagnosis Date Noted  . Liver lesion 04/22/2018  . Goals of care, counseling/discussion 04/22/2018  . Cognitive deficit, post-stroke 12/31/2017  . Ataxia, post-stroke 12/31/2017  . Dysarthria, post-stroke   . Gait disturbance, post-stroke   . H/O cerebral parenchymal hemorrhage 11/04/2017  . Essential hypertension 11/04/2017  . Hyperlipidemia 11/04/2017  . Diabetes ([place]) 11/04/2017  . CAD (coronary artery disease) 11/04/2017  . 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 Hypokalemia 11/04/2017  . Right-sided nontraumatic intracerebral hemorrhage of cerebellum ([place])   . History of cervical cancer   . History of TIA (transient ischemic attack)   . Acute systolic congestive heart failure ([place])   . Reactive hypertension   . Hypernatremia   . Leukocytosis   . Acute blood loss anemia   . Elevated serum creatinine   . Acute respiratory failure with hypoxia ([place])   . IVH (intraventricular hemorrhage) ([place]) 10/29/2017  . Hypoxia 10/28/2017  . Pancreatic lesion 05/24/2017  . 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 Nodule of upper lobe of left lung 06/04/2015  . 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 Cerebral thrombosis with cerebral infarction 04/03/2015  . Cancer of right colon ([place]) 03/28/2015  . CVA (cerebral infarction) 12/21/2014  . Polycythemia vera ([place]) 06/22/2006    Harrel Carina, MS, OTR/L 07/28/2018, 3:45 PM  [place] MAIN [facility] SERVICES [place]  [place], [place],  [address] Phone: [phone]   Fax:  [phone]  Name: [name] MRN: [id number] Date of Birth: [birth year]

## 2018-08-02 ENCOUNTER — Encounter: Payer: Self-pay | Admitting: Physical Therapy

## 2018-08-02 ENCOUNTER — Ambulatory Visit: Payer: Medicare Other | Admitting: Physical Therapy

## 2018-08-02 ENCOUNTER — Encounter: Payer: Self-pay | Admitting: Occupational Therapy

## 2018-08-02 ENCOUNTER — Ambulatory Visit: Payer: Medicare Other | Admitting: Occupational Therapy

## 2018-08-02 DIAGNOSIS — M6281 Muscle weakness (generalized): Secondary | ICD-10-CM | POA: Diagnosis not present

## 2018-08-02 DIAGNOSIS — R278 Other lack of coordination: Secondary | ICD-10-CM

## 2018-08-02 DIAGNOSIS — R262 Difficulty in walking, not elsewhere classified: Secondary | ICD-10-CM

## 2018-08-02 NOTE — Therapy (Signed)
[facility] MAIN REHAB SERVICES [address] [place], [place], [address] Phone: [phone]   Fax:  [phone]  Occupational Therapy Treatment  Patient Details  Name: [name] MRN: [id number] Date of Birth: 04/12/1932 No data recorded  Encounter Date: 08/02/2018  OT End of Session - 08/02/18 1213    Visit Number  38    Number of Visits  48    Date for OT Re-Evaluation  09/15/18    Authorization Type  Medicare    Authorization Time Period  Next progress report period starting 07/05/2018    OT Start Time  1100    OT Stop Time  1145    OT Time Calculation (min)  45 min    Activity Tolerance  Patient tolerated treatment well    Behavior During Therapy  [facility] for tasks assessed/performed       Past Medical History:  Diagnosis Date  . Adenocarcinoma in situ of cervix   . Arthritis    hands  . Breast cancer (HCC)   . Colon cancer (HCC)   . Diabetes mellitus without complication (HCC)   . Endometrial carcinoma (HCC)    s/p total abdominal hysterectomy  . H/O compression fracture of spine 2014   thoracic spine  . H/O polycythemia vera   . H/O TIA (transient ischemic attack) and stroke 09/2014, 03/2015   No deficits  . Hypertension   . Hyperuricemia   . Microalbuminuria   . Polycythemia vera (HCC)   . Recurrent falls   . Skin cancer    face, legs  . Stroke (HCC) 2008   no deficits  . Trochanteric bursitis   . Varicose veins    treated    Past Surgical History:  Procedure Laterality Date  . ABDOMINAL HYSTERECTOMY    . BOWEL RESECTION N/A 03/28/2015   Procedure: SMALL BOWEL RESECTION;  Surgeon: Jarvis Wilton Smith, MD;  Location: [facility] ORS;  Service: General;  Laterality: N/A;  . CATARACT EXTRACTION W/ INTRAOCULAR LENS IMPLANT Right   . CATARACT EXTRACTION W/PHACO Left 10/07/2015   Procedure: CATARACT EXTRACTION PHACO AND INTRAOCULAR LENS PLACEMENT (IOC);  Surgeon: Anita Prakash Vin-Parikh, MD;  Location: [facility];   Service: Ophthalmology;  Laterality: Left;  DIABETIC - oral meds VISION BLUE  . CORONARY ANGIOGRAPHY N/A 10/29/2017   Procedure: CORONARY ANGIOGRAPHY;  Surgeon: Khan, Shaukat A, MD;  Location: [facility] INVASIVE CV LAB;  Service: Cardiovascular;  Laterality: N/A;  . EXPLORATORY LAPAROTOMY     for fibroids  . LEFT HEART CATH Right 10/29/2017   Procedure: Left Heart Cath;  Surgeon: Khan, Shaukat A, MD;  Location: [facility] INVASIVE CV LAB;  Service: Cardiovascular;  Laterality: Right;  . TONSILLECTOMY      There were no vitals filed for this visit.  Subjective Assessment - 08/02/18 1212    Subjective   Pt. reports she has not done any writing since she scraped her finger.    Pertinent History  85-year-old female with adenocarcinoma of the cervix as well as endometrial carcinoma and polycythemia vera as well as diabetes who had hypertensive right cerebellar hemorrhage onset 10/28/2017.  She was treated initially at [facility] and then transferred to [facility].  Neurosurgical evaluation per Dr. Jones concluded no surgery was needed.  The patient completed inpatient rehabilitation at [facility] (including SLP) 11/04/2017-11/18/2107 and was discharged at a 24-hour supervision level.  The patient has hired caregiver to provide 24-hour supervision. Patient has received home health rehab services, including SLP.       Patient Stated Goals  Patient would like to be independent in all tasks and be able to live alone again.    Currently in Pain?  No/denies      OT TREATMENT    Neuro muscular re-education:  Pt. worked on grasping 1" circular spheres from a container with her right hand, and placed them on a pegboard positioned at a vertical angle on the tabletop to encourage wrist extension.  Selfcare:  Pt. Worked on IADL kitchen tasks reaching for dishes in the cabinet on low, and high shelves with CGA. Pt. Worked on loading, and unloading the dishwasher with CGA, transporting items using the  countertops, using the microwave, and placing items into, and retrieving items from the microwave. Pt. Was able to independently access the refrigerator.                          OT Education - 08/02/18 1213    Education Details  IADLs kitchen tasks.    Person(s) Educated  Patient    Comprehension  Verbalized understanding;Returned demonstration;Verbal cues required          OT Long Term Goals - 07/05/18 1645      OT LONG TERM GOAL #1   Title  Patient will complete bathing with modified independence    Baseline  1/14/20120: Pt. continues to have difficulty with washing the bottom of her feet, however is able to complete it.    Time  12    Period  Weeks    Status  Partially Met    Target Date  09/15/18      OT LONG TERM GOAL #2   Title  Patient will complete dressing skills with modified independence    Baseline  07/05/2018: Pt. conitnues to require assist with hooking her bra. Pt. requires care giver assist secondary to requiring increased time to complete.    Time  12    Period  Weeks    Status  On-going    Target Date  09/15/18      OT LONG TERM GOAL #3   Title  Patient will complete light meal prep with min assist    Baseline  07/05/2018: Pt. is able to fix herself something light to eat, However requires assist formeal preparation.    Time  12    Period  Weeks    Status  On-going    Target Date  09/15/18      OT LONG TERM GOAL #4   Title  Patient will increase R grip strength by 5# to open jars and containers with modified independence    Baseline  07/05/2018: Grip is improving, however requires assist to open containers.    Time  12    Period  Weeks    Status  On-going    Target Date  09/15/18      OT LONG TERM GOAL #5   Title  Patient will improve strength by 1 mm grade RUE to assist with obtaining items from the closet.     Baseline  07/05/2018: Pt. has made progress, and has difficulty reaching for items in the closet.    Time  8     Period  Weeks    Status  On-going    Target Date  09/15/18      OT LONG TERM GOAL #6   Title  Pt will increase handwriting legibility and speed to 50% while writing a 3 sentence paragraph in order to write thank   you notes and birthday cards for friends and family    Baseline  07/05/2018: 50% writing lebility for one sentence with increased time.    Time  5    Period  Weeks    Status  On-going    Target Date  09/15/18            Plan - 08/02/18 1214    Clinical Impression Statement  Upon arrival noticed pt.'s coban wrap on her hand was too tight. Pt. reported that it is very constricting since her aid wrapped her hand. Pt. hand was rewrapped with a softer wrap-Hartmann co-wrap. Pt. reported more freedom of movement since, and was able to engage her right hand more during tasks. Pt. reports no pain. Pt. continues to work on improving functional reaching, and  FMC skills in order to be able to tolerate reaching for items in her closet and cabinets, and  opening jars, and containers, and writing legibly, and  performing UE, and LE dressing tasks.    Occupational Profile and client history currently impacting functional performance  family more than 30 mins away, has 24 hour caregiver assist, decreased awareness, memory and safety    Occupational performance deficits (Please refer to evaluation for details):  ADL's;IADL's;Social Participation    Rehab Potential  Good    Current Impairments/barriers affecting progress:  memory, requires 24 hour caregivers,     OT Frequency  2x / week    OT Duration  12 weeks    OT Treatment/Interventions  Self-care/ADL training;Cryotherapy;Therapeutic exercise;DME and/or AE instruction;Functional Mobility Training;Cognitive remediation/compensation;Balance training;Neuromuscular education;Manual Therapy;Moist Heat;Therapeutic activities;Patient/family education    Clinical Decision Making  Several treatment options, min-mod task modification necessary     Consulted and Agree with Plan of Care  Patient    Family Member Consulted  Caregiver       Patient will benefit from skilled therapeutic intervention in order to improve the following deficits and impairments:  Decreased balance, Decreased mobility, Difficulty walking, Decreased cognition, Decreased activity tolerance, Decreased coordination, Decreased strength, Impaired UE functional use  Visit Diagnosis: Muscle weakness (generalized)  Other lack of coordination    Problem List Patient Active Problem List   Diagnosis Date Noted  . Liver lesion 04/22/2018  . Goals of care, counseling/discussion 04/22/2018  . Cognitive deficit, post-stroke 12/31/2017  . Ataxia, post-stroke 12/31/2017  . Dysarthria, post-stroke   . Gait disturbance, post-stroke   . H/O cerebral parenchymal hemorrhage 11/04/2017  . Essential hypertension 11/04/2017  . Hyperlipidemia 11/04/2017  . Diabetes (HCC) 11/04/2017  . CAD (coronary artery disease) 11/04/2017  . Aortic arch aneurysm (HCC) 11/04/2017  . Hypokalemia 11/04/2017  . Right-sided nontraumatic intracerebral hemorrhage of cerebellum (HCC)   . History of cervical cancer   . History of TIA (transient ischemic attack)   . Acute systolic congestive heart failure (HCC)   . Reactive hypertension   . Hypernatremia   . Leukocytosis   . Acute blood loss anemia   . Elevated serum creatinine   . Acute respiratory failure with hypoxia (HCC)   . IVH (intraventricular hemorrhage) (HCC) 10/29/2017  . Hypoxia 10/28/2017  . Pancreatic lesion 05/24/2017  . 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 Nodule of upper lobe of left lung 06/04/2015  . 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 Cerebral thrombosis with cerebral infarction 04/03/2015  . Cancer of right colon (HCC) 03/28/2015  . CVA (cerebral infarction) 12/21/2014  . Polycythemia vera (HCC) 06/22/2006    Tria Noguera, MS, OTR/L 08/02/2018, 12:23   PM  [facility] MAIN REHAB SERVICES [address] [place], [place], [address] Phone: [phone]   Fax:  [phone]  Name: [name] MRN: [id number] Date of Birth: 03/19/1932 

## 2018-08-02 NOTE — Therapy (Signed)
[place] MAIN [facility] SERVICES [address] [place], [place], [address] Phone: [phone]   Fax:  [phone]  Physical Therapy Treatment  Patient Details  Name: [name] MRN: [id number] Date of Birth: [birth year] Referring Provider (PT): Charlett Blake   Encounter Date: 08/02/2018  PT End of Session - 08/02/18 1039    Visit Number  33    Number of Visits  23    Date for PT Re-Evaluation  08/16/18    Authorization Type  3/10 start of care 01/31/18, last goals 07/19/18     PT Start Time  1032    PT Stop Time  1115    PT Time Calculation (min)  43 min    Equipment Utilized During Treatment  Gait belt    Activity Tolerance  Patient tolerated treatment well    Behavior During Therapy  WFL for tasks assessed/performed       Past Medical History:  Diagnosis Date  . Adenocarcinoma in situ of cervix   . Arthritis    hands  . Breast cancer ([place])   . Colon cancer ([place])   . Diabetes mellitus without complication ([place])   . Endometrial carcinoma (HCC)    s/p total abdominal hysterectomy  . H/O compression fracture of spine 2014   thoracic spine  . H/O polycythemia vera   . H/O TIA (transient ischemic attack) and stroke 09/2014, 03/2015   No deficits  . Hypertension   . Hyperuricemia   . Microalbuminuria   . Polycythemia vera ([place])   . Recurrent falls   . Skin cancer    face, legs  . Stroke [facility]) 2008   no deficits  . Trochanteric bursitis   . Varicose veins    treated    Past Surgical History:  Procedure Laterality Date  . ABDOMINAL HYSTERECTOMY    . BOWEL RESECTION N/A 03/28/2015   Procedure: SMALL BOWEL RESECTION;  Surgeon: Leonie Green, MD;  Location: [facility] ORS;  Service: General;  Laterality: N/A;  . CATARACT EXTRACTION W/ INTRAOCULAR LENS IMPLANT Right   . CATARACT EXTRACTION W/PHACO Left 10/07/2015   Procedure: CATARACT EXTRACTION PHACO AND INTRAOCULAR LENS PLACEMENT (IOC);  Surgeon: Ronnell Freshwater,  MD;  Location: [place];  Service: Ophthalmology;  Laterality: Left;  DIABETIC - oral meds VISION BLUE  . CORONARY ANGIOGRAPHY N/A 10/29/2017   Procedure: CORONARY ANGIOGRAPHY;  Surgeon: Dionisio David, MD;  Location: [place] CV LAB;  Service: Cardiovascular;  Laterality: N/A;  . EXPLORATORY LAPAROTOMY     for fibroids  . LEFT HEART CATH Right 10/29/2017   Procedure: Left Heart Cath;  Surgeon: Dionisio David, MD;  Location: [place] CV LAB;  Service: Cardiovascular;  Laterality: Right;  . TONSILLECTOMY      There were no vitals filed for this visit.  Subjective Assessment - 08/02/18 1038    Subjective  Patient reports doing well today. Denies any pain currently. She reports adherence to HEP and believes that her legs are getting a little stronger.    Pertinent History    85 year old female with adenocarcinoma of the cervix as well as endometrial carcinoma and polycythemia vera as well as diabetes who had hypertensive right cerebellar hemorrhage onset 10/28/2017.  She was treated initially at [facility] and then transferred to [facility].  Neurosurgical evaluation per Dr. Ronnald Ramp concluded no surgery was needed.  The patient completed inpatient rehabilitation at [facility] (including SLP) 11/04/2017-11/18/2107 and was discharged at a 24-hour  supervision level.  The patient has hired caregiver to provide 24-hour supervision. Patient has received home health rehab services, including SLP.    Limitations  Standing;Walking    How long can you walk comfortably?  a few minutes    Patient Stated Goals  Patient wants to walk better and not need the RW.     Currently in Pain?  No/denies    Multiple Pain Sites  No           TREATMENT: Warm up on Nustep BUE/BLE level 2 x5 min (unbilled) Exercise:  Hamstring curl, BLE plate #4, 3A25 with min VCs for positioning for correct exercise technique and strengthening;   NMR: Standing in parallel bars:  Standing  on airex foam: -alternate toe taps to 4 inch step with0rail assist x15 reps bilaterally; -Standing one foot on airex, one foot on 4 inch step, BUE ball pass side/side x5 reps each foot on step -modified tandem stance: eyes open/closed 10 sec hold x2 reps each foot in front; -tandem stance on airex beam, 10 sec hold, unsupported x2 reps each foot in front;  Patient required min VCs for balance stability, including to increase trunk control for less loss of balance with smaller base of support  Resisted walking, 12.5# forward/ backward, side/side x2 laps each direction with min A for safety and cues to increase step length and slow down eccentric return for better stance control;   Gait in hallway: -forward walking with head turns side/side, up/down x80 feet x1 each  -forward/backward walking directional change x50 feet Exhibits increased lateral instability with increased right foot drag especially with lateral head turns; patient required CGA for safety; Patient instructed to increase speed for better momentum to reduce loss of balance; Patient has increased difficulty keeping balance with head turns;   Tolerated session well with minimal fatigue at end of session; discontinued sit<>stand exercise as patient reported increased knee discomfort;                        PT Education - 08/02/18 1038    Education Details  exercise technique/balance; HEP reinforced    Person(s) Educated  Patient    Methods  Explanation;Verbal cues    Comprehension  Verbalized understanding;Returned demonstration;Verbal cues required;Need further instruction       PT Short Term Goals - 07/19/18 1123      PT SHORT TERM GOAL #1   Title  Patient will reduce timed up and go to <11 seconds to reduce fall risk and demonstrate improved transfer/gait ability.    Baseline  22.07 sec; 03/24/18: 14.2sec; 04/25/18: 14.0 sec with RW, 06/01/18: 12.7s, 07/19/18: 11.12 sec    Time  2    Period  Weeks     Status  Achieved      PT SHORT TERM GOAL #2   Title  Patient will be independent with ascend/descend 2 steps using single UE in step over step pattern without LOB.    Baseline  03/24/18: CGA, single UE support, step-to pattern without LOB; 04/25/18: CGA, single UE support, step-over-step pattern without LOB; 06/01/18: CGA, single UE support; 07/19/18: supervision with 1 rail, but does step to pattern descending;     Time  2    Period  Weeks    Status  Partially Met    Target Date  08/02/18        PT Long Term Goals - 07/19/18 1124      PT LONG TERM GOAL #1  Title  Patient will be independent in home exercise program to improve strength/mobility for better functional independence with ADLs.    Baseline  04/25/18: unsure but does not remember doing HEP lately; 06/01/18: Pt performs intermittently    Time  8    Period  Weeks    Status  Achieved      PT LONG TERM GOAL #2   Title  Patient (> 73 years old) will complete five times sit to stand test in < 15 seconds indicating an increased LE strength and improved balance    Baseline  34.21 sec; 03/24/2018: 22.9 sec; 04/25/18: 18.5 sec with BUE support; 06/01/18: 15.5s with BUE support, still unable to perform STS without UE support; 07/19/18: 20 sec with 1 HHA    Time  4    Period  Weeks    Status  Not Met    Target Date  08/16/18      PT LONG TERM GOAL #3   Title  Patient will increase six minute walk test distance to >1000 for progression to community ambulator and improve gait ability    Baseline  550 feet; 03/24/18: 795 ft.; 04/25/18: 975 ft with RW; 06/01/18: 1040' with RW; 07/19/18: 1050 without AD    Time  8    Period  Weeks    Status  Achieved      PT LONG TERM GOAL #4   Title  Patient will increase 10 meter walk test to >1.33ms as to improve gait speed for better community ambulation and to reduce fall risk    Baseline  .49 m/sec; 03/24/18: 1.01 m/sec; 06/01/18: self-selected: 9.7s = 1.03 m/s, fastest: 8.0s = 1.25 m/s    Period   Weeks    Status  Achieved      PT LONG TERM GOAL #5   Title  Pt will improve BERG by at least 3 points in order to demonstrate clinically significant improvement in balance.      Baseline  06/01/18: 44/56    Time  4    Period  Weeks    Status  Partially Met    Target Date  08/16/18            Plan - 08/02/18 1120    Clinical Impression Statement  Patient is doing well; She continues to have right foot drag when walking with head turns. Patient instructed to improve gait speed to improve dynamic balance with better momentum however patient does have difficulty. She does exhibit improved stance control being able to hold narrow base of support with less rail assist. She would benefit from additional skilled PT Intervention to improve strength, balance and gait safety;     PT Frequency  2x / week    PT Duration  4 weeks    PT Treatment/Interventions  Patient/family education;Neuromuscular re-education;Balance training;Stair training;Therapeutic activities;Therapeutic exercise;Gait training;Aquatic Therapy    PT Next Visit Plan  balance and strengthening    Consulted and Agree with Plan of Care  Patient       Patient will benefit from skilled therapeutic intervention in order to improve the following deficits and impairments:  Abnormal gait, Decreased balance, Decreased endurance, Decreased mobility, Impaired flexibility, Decreased strength, Decreased knowledge of use of DME, Decreased activity tolerance, Difficulty walking  Visit Diagnosis: Muscle weakness (generalized)  Other lack of coordination  Difficulty in walking, not elsewhere classified     Problem List Patient Active Problem List   Diagnosis Date Noted  . Liver lesion 04/22/2018  . Goals of  care, counseling/discussion 04/22/2018  . Cognitive deficit, post-stroke 12/31/2017  . Ataxia, post-stroke 12/31/2017  . Dysarthria, post-stroke   . Gait disturbance, post-stroke   . H/O cerebral parenchymal hemorrhage  11/04/2017  . Essential hypertension 11/04/2017  . Hyperlipidemia 11/04/2017  . Diabetes ([place]) 11/04/2017  . CAD (coronary artery disease) 11/04/2017  . 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 Hypokalemia 11/04/2017  . Right-sided nontraumatic intracerebral hemorrhage of cerebellum ([place])   . History of cervical cancer   . History of TIA (transient ischemic attack)   . Acute systolic congestive heart failure ([place])   . Reactive hypertension   . Hypernatremia   . Leukocytosis   . Acute blood loss anemia   . Elevated serum creatinine   . Acute respiratory failure with hypoxia ([place])   . IVH (intraventricular hemorrhage) ([place]) 10/29/2017  . Hypoxia 10/28/2017  . Pancreatic lesion 05/24/2017  . 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 Nodule of upper lobe of left lung 06/04/2015  . 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 Cerebral thrombosis with cerebral infarction 04/03/2015  . Cancer of right colon ([place]) 03/28/2015  . CVA (cerebral infarction) 12/21/2014  . Polycythemia vera ([place]) 06/22/2006    Trotter,Margaret PT, DPT 08/02/2018, 11:22 AM  [place] MAIN [facility] SERVICES [address] [place], [place], [address] Phone: [phone]   Fax:  [phone]  Name: [name] MRN: [id number] Date of Birth: [birth year]

## 2018-08-04 ENCOUNTER — Encounter: Payer: Self-pay | Admitting: Physical Therapy

## 2018-08-04 ENCOUNTER — Ambulatory Visit: Payer: Medicare Other | Admitting: Physical Therapy

## 2018-08-04 ENCOUNTER — Ambulatory Visit: Payer: Medicare Other | Admitting: Occupational Therapy

## 2018-08-04 ENCOUNTER — Encounter: Payer: Self-pay | Admitting: Occupational Therapy

## 2018-08-04 DIAGNOSIS — R278 Other lack of coordination: Secondary | ICD-10-CM

## 2018-08-04 DIAGNOSIS — M6281 Muscle weakness (generalized): Secondary | ICD-10-CM

## 2018-08-04 DIAGNOSIS — R262 Difficulty in walking, not elsewhere classified: Secondary | ICD-10-CM

## 2018-08-04 NOTE — Therapy (Signed)
[place] MAIN [facility] SERVICES [address] [place], [place], [address] Phone: [phone]   Fax:  [phone]  Occupational Therapy Treatment  Patient Details  Name: [name] MRN: [id number] Date of Birth: 05-03-1932 No data recorded  Encounter Date: 08/04/2018  OT End of Session - 08/04/18 1156    Visit Number  39    Number of Visits  41    Date for OT Re-Evaluation  09/15/18    Authorization Type  Medicare    Authorization Time Period  Next progress report period starting 07/05/2018    OT Start Time  1145    OT Stop Time  1230    OT Time Calculation (min)  45 min    Activity Tolerance  Patient tolerated treatment well    Behavior During Therapy  [facility] for tasks assessed/performed       Past Medical History:  Diagnosis Date  . Adenocarcinoma in situ of cervix   . Arthritis    hands  . Breast cancer ([place])   . Colon cancer ([place])   . Diabetes mellitus without complication ([place])   . Endometrial carcinoma (HCC)    s/p total abdominal hysterectomy  . H/O compression fracture of spine 2014   thoracic spine  . H/O polycythemia vera   . H/O TIA (transient ischemic attack) and stroke 09/2014, 03/2015   No deficits  . Hypertension   . Hyperuricemia   . Microalbuminuria   . Polycythemia vera ([place])   . Recurrent falls   . Skin cancer    face, legs  . Stroke [facility]) 2008   no deficits  . Trochanteric bursitis   . Varicose veins    treated    Past Surgical History:  Procedure Laterality Date  . ABDOMINAL HYSTERECTOMY    . BOWEL RESECTION N/A 03/28/2015   Procedure: SMALL BOWEL RESECTION;  Surgeon: Leonie Green, MD;  Location: [facility] ORS;  Service: General;  Laterality: N/A;  . CATARACT EXTRACTION W/ INTRAOCULAR LENS IMPLANT Right   . CATARACT EXTRACTION W/PHACO Left 10/07/2015   Procedure: CATARACT EXTRACTION PHACO AND INTRAOCULAR LENS PLACEMENT (IOC);  Surgeon: Ronnell Freshwater, MD;  Location: [place];   Service: Ophthalmology;  Laterality: Left;  DIABETIC - oral meds VISION BLUE  . CORONARY ANGIOGRAPHY N/A 10/29/2017   Procedure: CORONARY ANGIOGRAPHY;  Surgeon: Dionisio David, MD;  Location: [place] CV LAB;  Service: Cardiovascular;  Laterality: N/A;  . EXPLORATORY LAPAROTOMY     for fibroids  . LEFT HEART CATH Right 10/29/2017   Procedure: Left Heart Cath;  Surgeon: Dionisio David, MD;  Location: [place] CV LAB;  Service: Cardiovascular;  Laterality: Right;  . TONSILLECTOMY      There were no vitals filed for this visit.  Subjective Assessment - 08/04/18 1149    Subjective   Pt.'s hand is feeling better.    Pertinent History  85 year old female with adenocarcinoma of the cervix as well as endometrial carcinoma and polycythemia vera as well as diabetes who had hypertensive right cerebellar hemorrhage onset 10/28/2017.  She was treated initially at [facility] and then transferred to [facility].  Neurosurgical evaluation per Dr. Ronnald Ramp concluded no surgery was needed.  The patient completed inpatient rehabilitation at [facility] (including SLP) 11/04/2017-11/18/2107 and was discharged at a 24-hour supervision level.  The patient has hired caregiver to provide 24-hour supervision. Patient has received home health rehab services, including SLP.     Currently in Pain?  No/denies      OT TREATMENT    Neuro muscular re-education:  Therapeutic Exercise:  Pt. worked on Media planner in printed form with great than 75% legibility, and cursive form with 50% legibility. Pt. requires increased time to complete the sentences formulating each letter slowly.  Pt. presented with no deviation from the line on blank paper keeping sentence lines straight. Pt. printed one sentence in 4 min. & 33 sec. Pt. worked on check writing tasks requiring increased time to complete with printed form greater then 75% legibility.                      OT Education -  08/04/18 1156    Education Details  writing, and checkwriting tasks.    Person(s) Educated  Patient    Methods  Explanation;Demonstration;Verbal cues    Comprehension  Verbalized understanding;Returned demonstration;Verbal cues required          OT Long Term Goals - 07/05/18 1645      OT LONG TERM GOAL #1   Title  Patient will complete bathing with modified independence    Baseline  1/14/20120: Pt. continues to have difficulty with washing the bottom of her feet, however is able to complete it.    Time  12    Period  Weeks    Status  Partially Met    Target Date  09/15/18      OT LONG TERM GOAL #2   Title  Patient will complete dressing skills with modified independence    Baseline  07/05/2018: Pt. conitnues to require assist with hooking her bra. Pt. requires care giver assist secondary to requiring increased time to complete.    Time  12    Period  Weeks    Status  On-going    Target Date  09/15/18      OT LONG TERM GOAL #3   Title  Patient will complete light meal prep with min assist    Baseline  07/05/2018: Pt. is able to fix herself something light to eat, However requires assist formeal preparation.    Time  12    Period  Weeks    Status  On-going    Target Date  09/15/18      OT LONG TERM GOAL #4   Title  Patient will increase R grip strength by 5# to open jars and containers with modified independence    Baseline  07/05/2018: Grip is improving, however requires assist to open containers.    Time  12    Period  Weeks    Status  On-going    Target Date  09/15/18      OT LONG TERM GOAL #5   Title  Patient will improve strength by 1 mm grade RUE to assist with obtaining items from the closet.     Baseline  07/05/2018: Pt. has made progress, and has difficulty reaching for items in the closet.    Time  8    Period  Weeks    Status  On-going    Target Date  09/15/18      OT LONG TERM GOAL #6   Title  Pt will increase handwriting legibility and speed to 50% while  writing a 3 sentence paragraph in order to write thank you notes and birthday cards for friends and family    Baseline  07/05/2018: 50% writing lebility for one sentence with increased time.    Time  5    Period  Weeks    Status  On-going    Target Date  09/15/18            Plan - 08/04/18 1157    Clinical Impression Statement  Pt.'s right hand is feeling better, is improving, and has healed. Pt.'s co-wrap has been removed. Pt. continues to work on improving right UE and hand function in order to independently be able to write checks. and notecard correspondence efficiently, as well as reaching for improved independence with ADLS, and IADL tasks.     Occupational Profile and client history currently impacting functional performance  family more than 30 mins away, has 24 hour caregiver assist, decreased awareness, memory and safety    Occupational performance deficits (Please refer to evaluation for details):  ADL's;IADL's;Social Participation    Rehab Potential  Good    Current Impairments/barriers affecting progress:  memory, requires 24 hour caregivers,     OT Frequency  2x / week    OT Duration  12 weeks    OT Treatment/Interventions  Self-care/ADL training;Cryotherapy;Therapeutic exercise;DME and/or AE instruction;Functional Mobility Training;Cognitive remediation/compensation;Balance training;Neuromuscular education;Manual Therapy;Moist Heat;Therapeutic activities;Patient/family education    Clinical Decision Making  Several treatment options, min-mod task modification necessary    Consulted and Agree with Plan of Care  Patient    Family Member Consulted  Caregiver       Patient will benefit from skilled therapeutic intervention in order to improve the following deficits and impairments:  Decreased balance, Decreased mobility, Difficulty walking, Decreased cognition, Decreased activity tolerance, Decreased coordination, Decreased strength, Impaired UE functional use  Visit  Diagnosis: Muscle weakness (generalized)  Other lack of coordination    Problem List Patient Active Problem List   Diagnosis Date Noted  . Liver lesion 04/22/2018  . Goals of care, counseling/discussion 04/22/2018  . Cognitive deficit, post-stroke 12/31/2017  . Ataxia, post-stroke 12/31/2017  . Dysarthria, post-stroke   . Gait disturbance, post-stroke   . H/O cerebral parenchymal hemorrhage 11/04/2017  . Essential hypertension 11/04/2017  . Hyperlipidemia 11/04/2017  . Diabetes ([place]) 11/04/2017  . CAD (coronary artery disease) 11/04/2017  . 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 Hypokalemia 11/04/2017  . Right-sided nontraumatic intracerebral hemorrhage of cerebellum ([place])   . History of cervical cancer   . History of TIA (transient ischemic attack)   . Acute systolic congestive heart failure ([place])   . Reactive hypertension   . Hypernatremia   . Leukocytosis   . Acute blood loss anemia   . Elevated serum creatinine   . Acute respiratory failure with hypoxia ([place])   . IVH (intraventricular hemorrhage) ([place]) 10/29/2017  . Hypoxia 10/28/2017  . Pancreatic lesion 05/24/2017  . 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 Nodule of upper lobe of left lung 06/04/2015  . 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 Cerebral thrombosis with cerebral infarction 04/03/2015  . Cancer of right colon ([place]) 03/28/2015  . CVA (cerebral infarction) 12/21/2014  . Polycythemia vera ([place]) 06/22/2006    Harrel Carina, MS, OTR/L 08/04/2018, 12:08 PM  [place] MAIN [facility] SERVICES [address] [place], [place], [address] Phone: [phone]   Fax:  [phone]  Name: [name] MRN: [id number] Date of Birth: [birth year]

## 2018-08-04 NOTE — Therapy (Signed)
[place] MAIN [facility] SERVICES [address] [place], [place], [address] Phone: [phone]   Fax:  [phone]  Physical Therapy Treatment  Patient Details  Name: [name] MRN: [id number] Date of Birth: 1932/05/27 Referring Provider (PT): Charlett Blake   Encounter Date: 08/04/2018  PT End of Session - 08/04/18 1057    Visit Number  34    Number of Visits  7    Date for PT Re-Evaluation  08/16/18    Authorization Type  4/10 start of care 01/31/18, last goals 07/19/18     PT Start Time  1101    PT Stop Time  1143    PT Time Calculation (min)  42 min    Equipment Utilized During Treatment  Gait belt    Activity Tolerance  Patient tolerated treatment well;No increased pain    Behavior During Therapy  WFL for tasks assessed/performed       Past Medical History:  Diagnosis Date  . Adenocarcinoma in situ of cervix   . Arthritis    hands  . Breast cancer ([place])   . Colon cancer ([place])   . Diabetes mellitus without complication ([place])   . Endometrial carcinoma (HCC)    s/p total abdominal hysterectomy  . H/O compression fracture of spine 2014   thoracic spine  . H/O polycythemia vera   . H/O TIA (transient ischemic attack) and stroke 09/2014, 03/2015   No deficits  . Hypertension   . Hyperuricemia   . Microalbuminuria   . Polycythemia vera ([place])   . Recurrent falls   . Skin cancer    face, legs  . Stroke [facility]) 2008   no deficits  . Trochanteric bursitis   . Varicose veins    treated    Past Surgical History:  Procedure Laterality Date  . ABDOMINAL HYSTERECTOMY    . BOWEL RESECTION N/A 03/28/2015   Procedure: SMALL BOWEL RESECTION;  Surgeon: Leonie Green, MD;  Location: [facility] ORS;  Service: General;  Laterality: N/A;  . CATARACT EXTRACTION W/ INTRAOCULAR LENS IMPLANT Right   . CATARACT EXTRACTION W/PHACO Left 10/07/2015   Procedure: CATARACT EXTRACTION PHACO AND INTRAOCULAR LENS PLACEMENT (IOC);  Surgeon: Ronnell Freshwater, MD;  Location: [place];  Service: Ophthalmology;  Laterality: Left;  DIABETIC - oral meds VISION BLUE  . CORONARY ANGIOGRAPHY N/A 10/29/2017   Procedure: CORONARY ANGIOGRAPHY;  Surgeon: Dionisio David, MD;  Location: [place] CV LAB;  Service: Cardiovascular;  Laterality: N/A;  . EXPLORATORY LAPAROTOMY     for fibroids  . LEFT HEART CATH Right 10/29/2017   Procedure: Left Heart Cath;  Surgeon: Dionisio David, MD;  Location: [place] CV LAB;  Service: Cardiovascular;  Laterality: Right;  . TONSILLECTOMY      There were no vitals filed for this visit.  Subjective Assessment - 08/04/18 1108    Subjective  Patient reports doing well; no pain; reports doing exercise at least 1x a day; no new falls;     Pertinent History    85 year old female with adenocarcinoma of the cervix as well as endometrial carcinoma and polycythemia vera as well as diabetes who had hypertensive right cerebellar hemorrhage onset 10/28/2017.  She was treated initially at [facility] and then transferred to [facility].  Neurosurgical evaluation per Dr. Ronnald Ramp concluded no surgery was needed.  The patient completed inpatient rehabilitation at [facility] (including SLP) 11/04/2017-11/18/2107 and was discharged at a 24-hour supervision level.  The  patient has hired caregiver to provide 24-hour supervision. Patient has received home health rehab services, including SLP.    Limitations  Standing;Walking    How long can you walk comfortably?  a few minutes    Patient Stated Goals  Patient wants to walk better and not need the RW.     Currently in Pain?  No/denies    Multiple Pain Sites  No           TREATMENT: Warm up on Nustep BUE/BLE level 2 x5 min (unbilled) Exercise:  Hamstring curl, BLE plate #4, 3Y86 with min VCs for positioning for correct exercise technique and strengthening;  Sit<>Stand with 4# yellow ball overhead lift x10 reps from lower gray mat table  x10 reps with CGA for safety and min VCs to increase forward weight shift for better transfer ability;   NMR:  Resisted walking, 12.5# forward/ backward, side/side (4 way)  x2 laps each direction with min A for safety and cues to increase step length and slow down eccentric return for better stance control;   Gait in hallway: -forward walking with head turns side/side, up/down x80 feet x2 each  Exhibits increased lateral instability with increased right foot drag especially with lateral head turns; patient required CGA for safety; Patient instructed to increase speed for better momentum to reduce loss of balance; Patient has increased difficulty keeping balance with head turns;  Weaving around cones #5 and stepping over orange hurdles #2 x2 sets with supervision and good gait safety with good foot clearance over hurdles; Side stepping over cones #5 and side stepping over orange hurdles #2 x1 set each direction with CGA for safety and cues to increase hip flexion for better foot clearance while also increasing step length for better space for 2nd leg stepping over.   Tandem gait on level surface x20 steps x2 sets with Min A for safety and cues for erect posture and to improve foot placement; she did have occasional mis steps with slight scissoring due to weakness and instabilty;  Gait crossovers on level surface x15 feet x1 lap each direction with min A for safety and cues for sequencing and foot placement;   Tolerated session well with minimal fatigue at end of session; Patient was able to tolerate sit<>Stand exercise better with less knee discomfort. She did have difficulty with dynamic balance activities especially tandem gait and crossover steps requiring min A for safety and balance;                      PT Education - 08/04/18 1057    Education Details  LE strengthening/balance; HEP reinforced;     Person(s) Educated  Patient    Methods  Explanation;Verbal cues     Comprehension  Verbalized understanding;Returned demonstration;Verbal cues required;Need further instruction       PT Short Term Goals - 07/19/18 1123      PT SHORT TERM GOAL #1   Title  Patient will reduce timed up and go to <11 seconds to reduce fall risk and demonstrate improved transfer/gait ability.    Baseline  22.07 sec; 03/24/18: 14.2sec; 04/25/18: 14.0 sec with RW, 06/01/18: 12.7s, 07/19/18: 11.12 sec    Time  2    Period  Weeks    Status  Achieved      PT SHORT TERM GOAL #2   Title  Patient will be independent with ascend/descend 2 steps using single UE in step over step pattern without LOB.    Baseline  03/24/18: CGA, single UE support, step-to pattern without LOB; 04/25/18: CGA, single UE support, step-over-step pattern without LOB; 06/01/18: CGA, single UE support; 07/19/18: supervision with 1 rail, but does step to pattern descending;     Time  2    Period  Weeks    Status  Partially Met    Target Date  08/02/18        PT Long Term Goals - 07/19/18 1124      PT LONG TERM GOAL #1   Title  Patient will be independent in home exercise program to improve strength/mobility for better functional independence with ADLs.    Baseline  04/25/18: unsure but does not remember doing HEP lately; 06/01/18: Pt performs intermittently    Time  8    Period  Weeks    Status  Achieved      PT LONG TERM GOAL #2   Title  Patient (> 63 years old) will complete five times sit to stand test in < 15 seconds indicating an increased LE strength and improved balance    Baseline  34.21 sec; 03/24/2018: 22.9 sec; 04/25/18: 18.5 sec with BUE support; 06/01/18: 15.5s with BUE support, still unable to perform STS without UE support; 07/19/18: 20 sec with 1 HHA    Time  4    Period  Weeks    Status  Not Met    Target Date  08/16/18      PT LONG TERM GOAL #3   Title  Patient will increase six minute walk test distance to >1000 for progression to community ambulator and improve gait ability    Baseline   550 feet; 03/24/18: 795 ft.; 04/25/18: 975 ft with RW; 06/01/18: 1040' with RW; 07/19/18: 1050 without AD    Time  8    Period  Weeks    Status  Achieved      PT LONG TERM GOAL #4   Title  Patient will increase 10 meter walk test to >1.74ms as to improve gait speed for better community ambulation and to reduce fall risk    Baseline  .49 m/sec; 03/24/18: 1.01 m/sec; 06/01/18: self-selected: 9.7s = 1.03 m/s, fastest: 8.0s = 1.25 m/s    Period  Weeks    Status  Achieved      PT LONG TERM GOAL #5   Title  Pt will improve BERG by at least 3 points in order to demonstrate clinically significant improvement in balance.      Baseline  06/01/18: 44/56    Time  4    Period  Weeks    Status  Partially Met    Target Date  08/16/18            Plan - 08/04/18 1148    Clinical Impression Statement  Patient tolerated session well. She was able to progress LE strengthening with sit<>Stand exercise today without increase in knee pain. Also advanced balance challenge with increased dynamic balance exercise including unsupported tandem walk and gait crossovers. She does require min A when unsupported due to occasional mis steps and instability; She would benefit from additional skilled PT Intervention to improve strength, balance and gait safety;     PT Frequency  2x / week    PT Duration  4 weeks    PT Treatment/Interventions  Patient/family education;Neuromuscular re-education;Balance training;Stair training;Therapeutic activities;Therapeutic exercise;Gait training;Aquatic Therapy    PT Next Visit Plan  balance and strengthening    Consulted and Agree with Plan of Care  Patient  Patient will benefit from skilled therapeutic intervention in order to improve the following deficits and impairments:  Abnormal gait, Decreased balance, Decreased endurance, Decreased mobility, Impaired flexibility, Decreased strength, Decreased knowledge of use of DME, Decreased activity tolerance, Difficulty  walking  Visit Diagnosis: Muscle weakness (generalized)  Other lack of coordination  Difficulty in walking, not elsewhere classified     Problem List Patient Active Problem List   Diagnosis Date Noted  . Liver lesion 04/22/2018  . Goals of care, counseling/discussion 04/22/2018  . Cognitive deficit, post-stroke 12/31/2017  . Ataxia, post-stroke 12/31/2017  . Dysarthria, post-stroke   . Gait disturbance, post-stroke   . H/O cerebral parenchymal hemorrhage 11/04/2017  . Essential hypertension 11/04/2017  . Hyperlipidemia 11/04/2017  . Diabetes ([place]) 11/04/2017  . CAD (coronary artery disease) 11/04/2017  . 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 Hypokalemia 11/04/2017  . Right-sided nontraumatic intracerebral hemorrhage of cerebellum ([place])   . History of cervical cancer   . History of TIA (transient ischemic attack)   . Acute systolic congestive heart failure ([place])   . Reactive hypertension   . Hypernatremia   . Leukocytosis   . Acute blood loss anemia   . Elevated serum creatinine   . Acute respiratory failure with hypoxia ([place])   . IVH (intraventricular hemorrhage) ([place]) 10/29/2017  . Hypoxia 10/28/2017  . Pancreatic lesion 05/24/2017  . 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 Nodule of upper lobe of left lung 06/04/2015  . 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 Cerebral thrombosis with cerebral infarction 04/03/2015  . Cancer of right colon ([place]) 03/28/2015  . CVA (cerebral infarction) 12/21/2014  . Polycythemia vera ([place]) 06/22/2006    Trotter,Margaret PT, DPT 08/04/2018, 11:49 AM  [place] MAIN [facility] SERVICES [address] [place], [place], [address] Phone: [phone]   Fax:  [phone]  Name: [name] MRN: [id number] Date of Birth: 10/29/1931

## 2018-08-09 ENCOUNTER — Ambulatory Visit: Payer: Medicare Other | Admitting: Occupational Therapy

## 2018-08-09 ENCOUNTER — Ambulatory Visit: Payer: Medicare Other

## 2018-08-09 DIAGNOSIS — M6281 Muscle weakness (generalized): Secondary | ICD-10-CM

## 2018-08-09 DIAGNOSIS — R262 Difficulty in walking, not elsewhere classified: Secondary | ICD-10-CM

## 2018-08-09 DIAGNOSIS — R278 Other lack of coordination: Secondary | ICD-10-CM

## 2018-08-09 NOTE — Therapy (Signed)
[place] MAIN [facility] SERVICES [address] [place], [place], [address] Phone: [phone]   Fax:  [phone]  Physical Therapy Treatment  Patient Details  Name: [name] MRN: [id number] Date of Birth: [birth year] Referring Provider (PT): Charlett Blake   Encounter Date: 08/09/2018  PT End of Session - 08/09/18 1118    Visit Number  35    Number of Visits  13    Date for PT Re-Evaluation  08/16/18    Authorization Type  5/10 start of care 01/31/18, last goals 07/19/18     PT Start Time  1114    PT Stop Time  1153    PT Time Calculation (min)  39 min    Equipment Utilized During Treatment  Gait belt    Activity Tolerance  Patient tolerated treatment well;No increased pain    Behavior During Therapy  WFL for tasks assessed/performed       Past Medical History:  Diagnosis Date  . Adenocarcinoma in situ of cervix   . Arthritis    hands  . Breast cancer ([place])   . Colon cancer ([place])   . Diabetes mellitus without complication ([place])   . Endometrial carcinoma (HCC)    s/p total abdominal hysterectomy  . H/O compression fracture of spine 2014   thoracic spine  . H/O polycythemia vera   . H/O TIA (transient ischemic attack) and stroke 09/2014, 03/2015   No deficits  . Hypertension   . Hyperuricemia   . Microalbuminuria   . Polycythemia vera ([place])   . Recurrent falls   . Skin cancer    face, legs  . Stroke [facility]) 2008   no deficits  . Trochanteric bursitis   . Varicose veins    treated    Past Surgical History:  Procedure Laterality Date  . ABDOMINAL HYSTERECTOMY    . BOWEL RESECTION N/A 03/28/2015   Procedure: SMALL BOWEL RESECTION;  Surgeon: Leonie Green, MD;  Location: [facility] ORS;  Service: General;  Laterality: N/A;  . CATARACT EXTRACTION W/ INTRAOCULAR LENS IMPLANT Right   . CATARACT EXTRACTION W/PHACO Left 10/07/2015   Procedure: CATARACT EXTRACTION PHACO AND INTRAOCULAR LENS PLACEMENT (IOC);  Surgeon: Ronnell Freshwater, MD;  Location: [place];  Service: Ophthalmology;  Laterality: Left;  DIABETIC - oral meds VISION BLUE  . CORONARY ANGIOGRAPHY N/A 10/29/2017   Procedure: CORONARY ANGIOGRAPHY;  Surgeon: Dionisio David, MD;  Location: [place] CV LAB;  Service: Cardiovascular;  Laterality: N/A;  . EXPLORATORY LAPAROTOMY     for fibroids  . LEFT HEART CATH Right 10/29/2017   Procedure: Left Heart Cath;  Surgeon: Dionisio David, MD;  Location: [place] CV LAB;  Service: Cardiovascular;  Laterality: Right;  . TONSILLECTOMY      There were no vitals filed for this visit.  Subjective Assessment - 08/09/18 1116    Subjective  Patient reports doing well; no pain; reports doing exercise at least 1x a day; no new falls;     Pertinent History    85 year old female with adenocarcinoma of the cervix as well as endometrial carcinoma and polycythemia vera as well as diabetes who had hypertensive right cerebellar hemorrhage onset 10/28/2017.  She was treated initially at [facility] and then transferred to [facility].  Neurosurgical evaluation per Dr. Ronnald Ramp concluded no surgery was needed.  The patient completed inpatient rehabilitation at [facility] (including SLP) 11/04/2017-11/18/2107 and was discharged at a 24-hour supervision level.  The  patient has hired caregiver to provide 24-hour supervision. Patient has received home health rehab services, including SLP.    Limitations  Standing;Walking    How long can you walk comfortably?  a few minutes    Patient Stated Goals  Patient wants to walk better and not need the RW.     Currently in Pain?  No/denies          TREATMENT:   Ther-ex  Seated marches x 15 bilateral; Seated clams with manual resistance x 15; Seated adductor squeeze with manual resistance x 15; Seated heel raises with manual resistance x 15; Seated LAQ with manual resistance x 15; Seated HS curls with green tband resistance x 15; Sit<>Stand  with 4# yellow ball overhead lift x 10 reps from regular height chair with Airex pad on seat CGA for safety and min VCs to increase forward weight shift for better transfer ability;    Neuromuscular Re-education  Forward/backward ambulation without UE support x 4 laps; Sidestepping without UE support x 4 laps; Airex balance WBOS/NBOS eyes open/closed x 30s in each condition; Airex balance with 6" step taps alternating LE x 10 each;   Pt educated throughout session about proper posture and technique with exercises. Improved exercise technique, movement at target joints, use of target muscles after min to mod verbal, visual, tactile cues.    Pt demonstrates good motivation during session with intermittent fatigue. Patient was able to tolerate sit<>Stand exercise without any reports of knee pain. She does have difficulty with balance on unstable surfaces. Pt encouraged to continue HEP. Pt will benefit from PT services to address deficits in strength, balance, and mobility in order to return to full function at home.                        PT Short Term Goals - 07/19/18 1123      PT SHORT TERM GOAL #1   Title  Patient will reduce timed up and go to <11 seconds to reduce fall risk and demonstrate improved transfer/gait ability.    Baseline  22.07 sec; 03/24/18: 14.2sec; 04/25/18: 14.0 sec with RW, 06/01/18: 12.7s, 07/19/18: 11.12 sec    Time  2    Period  Weeks    Status  Achieved      PT SHORT TERM GOAL #2   Title  Patient will be independent with ascend/descend 2 steps using single UE in step over step pattern without LOB.    Baseline  03/24/18: CGA, single UE support, step-to pattern without LOB; 04/25/18: CGA, single UE support, step-over-step pattern without LOB; 06/01/18: CGA, single UE support; 07/19/18: supervision with 1 rail, but does step to pattern descending;     Time  2    Period  Weeks    Status  Partially Met    Target Date  08/02/18        PT Long Term  Goals - 07/19/18 1124      PT LONG TERM GOAL #1   Title  Patient will be independent in home exercise program to improve strength/mobility for better functional independence with ADLs.    Baseline  04/25/18: unsure but does not remember doing HEP lately; 06/01/18: Pt performs intermittently    Time  8    Period  Weeks    Status  Achieved      PT LONG TERM GOAL #2   Title  Patient (> 49 years old) will complete five times sit to stand test in <  15 seconds indicating an increased LE strength and improved balance    Baseline  34.21 sec; 03/24/2018: 22.9 sec; 04/25/18: 18.5 sec with BUE support; 06/01/18: 15.5s with BUE support, still unable to perform STS without UE support; 07/19/18: 20 sec with 1 HHA    Time  4    Period  Weeks    Status  Not Met    Target Date  08/16/18      PT LONG TERM GOAL #3   Title  Patient will increase six minute walk test distance to >1000 for progression to community ambulator and improve gait ability    Baseline  550 feet; 03/24/18: 795 ft.; 04/25/18: 975 ft with RW; 06/01/18: 1040' with RW; 07/19/18: 1050 without AD    Time  8    Period  Weeks    Status  Achieved      PT LONG TERM GOAL #4   Title  Patient will increase 10 meter walk test to >1.79ms as to improve gait speed for better community ambulation and to reduce fall risk    Baseline  .49 m/sec; 03/24/18: 1.01 m/sec; 06/01/18: self-selected: 9.7s = 1.03 m/s, fastest: 8.0s = 1.25 m/s    Period  Weeks    Status  Achieved      PT LONG TERM GOAL #5   Title  Pt will improve BERG by at least 3 points in order to demonstrate clinically significant improvement in balance.      Baseline  06/01/18: 44/56    Time  4    Period  Weeks    Status  Partially Met    Target Date  08/16/18            Plan - 08/09/18 1119    Clinical Impression Statement  Pt demonstrates good motivation during session with intermittent fatigue. Patient was able to tolerate sit<>Stand exercise without any reports of knee pain. She  does have difficulty with balance on unstable surfaces. Pt encouraged to continue HEP. Pt will benefit from PT services to address deficits in strength, balance, and mobility in order to return to full function at home.     PT Frequency  2x / week    PT Duration  4 weeks    PT Treatment/Interventions  Patient/family education;Neuromuscular re-education;Balance training;Stair training;Therapeutic activities;Therapeutic exercise;Gait training;Aquatic Therapy    PT Next Visit Plan  balance and strengthening    Consulted and Agree with Plan of Care  Patient       Patient will benefit from skilled therapeutic intervention in order to improve the following deficits and impairments:  Abnormal gait, Decreased balance, Decreased endurance, Decreased mobility, Impaired flexibility, Decreased strength, Decreased knowledge of use of DME, Decreased activity tolerance, Difficulty walking  Visit Diagnosis: Muscle weakness (generalized)  Other lack of coordination  Difficulty in walking, not elsewhere classified     Problem List Patient Active Problem List   Diagnosis Date Noted  . Liver lesion 04/22/2018  . Goals of care, counseling/discussion 04/22/2018  . Cognitive deficit, post-stroke 12/31/2017  . Ataxia, post-stroke 12/31/2017  . Dysarthria, post-stroke   . Gait disturbance, post-stroke   . H/O cerebral parenchymal hemorrhage 11/04/2017  . Essential hypertension 11/04/2017  . Hyperlipidemia 11/04/2017  . Diabetes ([place] 11/04/2017  . CAD (coronary artery disease) 11/04/2017  . Aortic arch aneurysm ([place] 11/04/2017  . Hypokalemia 11/04/2017  . Right-sided nontraumatic intracerebral hemorrhage of cerebellum ([place]   . History of cervical cancer   . History of TIA (transient ischemic attack)   .  Acute systolic congestive heart failure ([place])   . Reactive hypertension   . Hypernatremia   . Leukocytosis   . Acute blood loss anemia   . Elevated serum creatinine   . Acute respiratory  failure with hypoxia ([place])   . IVH (intraventricular hemorrhage) ([place]) 10/29/2017  . Hypoxia 10/28/2017  . Pancreatic lesion 05/24/2017  . 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 Nodule of upper lobe of left lung 06/04/2015  . 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 Cerebral thrombosis with cerebral infarction 04/03/2015  . Cancer of right colon ([place]) 03/28/2015  . CVA (cerebral infarction) 12/21/2014  . Polycythemia vera ([place]) 06/22/2006   Phillips Grout PT, DPT, GCS  [name] 08/10/2018, 8:43 AM  [place] MAIN [facility] SERVICES [address] [place], [place], [address] Phone: [phone]   Fax:  [phone]  Name: [name] MRN: [id number] Date of Birth: 03/03/1932

## 2018-08-11 ENCOUNTER — Encounter: Payer: Self-pay | Admitting: Occupational Therapy

## 2018-08-11 ENCOUNTER — Ambulatory Visit: Payer: Medicare Other

## 2018-08-11 ENCOUNTER — Ambulatory Visit: Payer: Medicare Other | Admitting: Occupational Therapy

## 2018-08-11 ENCOUNTER — Encounter: Payer: Self-pay | Admitting: Physical Therapy

## 2018-08-11 DIAGNOSIS — R278 Other lack of coordination: Secondary | ICD-10-CM

## 2018-08-11 DIAGNOSIS — M6281 Muscle weakness (generalized): Secondary | ICD-10-CM

## 2018-08-11 DIAGNOSIS — R262 Difficulty in walking, not elsewhere classified: Secondary | ICD-10-CM

## 2018-08-11 NOTE — Therapy (Signed)
[place] MAIN [facility] SERVICES [address] [place], [place], [address] Phone: [phone]   Fax:  [phone]  Occupational Therapy Treatment/10th visit Reporting period from 07/05/2018  to 08/09/2018  Patient Details  Name: [name] MRN: [id number] Date of Birth: [birth year] No data recorded  Encounter Date: 08/09/2018  OT End of Session - 08/11/18 1024    Visit Number  40    Number of Visits  33    Date for OT Re-Evaluation  09/15/18    Authorization Type  Medicare    Authorization Time Period  Next progress report period starting 08/09/2018    OT Start Time  1015    OT Stop Time  1100    OT Time Calculation (min)  45 min    Activity Tolerance  Patient tolerated treatment well    Behavior During Therapy  [facility] for tasks assessed/performed       Past Medical History:  Diagnosis Date  . Adenocarcinoma in situ of cervix   . Arthritis    hands  . Breast cancer ([place])   . Colon cancer ([place])   . Diabetes mellitus without complication ([place])   . Endometrial carcinoma (HCC)    s/p total abdominal hysterectomy  . H/O compression fracture of spine 2014   thoracic spine  . H/O polycythemia vera   . H/O TIA (transient ischemic attack) and stroke 09/2014, 03/2015   No deficits  . Hypertension   . Hyperuricemia   . Microalbuminuria   . Polycythemia vera ([place])   . Recurrent falls   . Skin cancer    face, legs  . Stroke [facility]) 2008   no deficits  . Trochanteric bursitis   . Varicose veins    treated    Past Surgical History:  Procedure Laterality Date  . ABDOMINAL HYSTERECTOMY    . BOWEL RESECTION N/A 03/28/2015   Procedure: SMALL BOWEL RESECTION;  Surgeon: Leonie Green, MD;  Location: [facility] ORS;  Service: General;  Laterality: N/A;  . CATARACT EXTRACTION W/ INTRAOCULAR LENS IMPLANT Right   . CATARACT EXTRACTION W/PHACO Left 10/07/2015   Procedure: CATARACT EXTRACTION PHACO AND INTRAOCULAR LENS PLACEMENT (IOC);  Surgeon: Ronnell Freshwater, MD;  Location: [place];  Service: Ophthalmology;  Laterality: Left;  DIABETIC - oral meds VISION BLUE  . CORONARY ANGIOGRAPHY N/A 10/29/2017   Procedure: CORONARY ANGIOGRAPHY;  Surgeon: Dionisio David, MD;  Location: [place] CV LAB;  Service: Cardiovascular;  Laterality: N/A;  . EXPLORATORY LAPAROTOMY     for fibroids  . LEFT HEART CATH Right 10/29/2017   Procedure: Left Heart Cath;  Surgeon: Dionisio David, MD;  Location: [place] CV LAB;  Service: Cardiovascular;  Laterality: Right;  . TONSILLECTOMY      There were no vitals filed for this visit.  Subjective Assessment - 08/11/18 1023    Subjective   Patient reports she would like to work on her handwriting today.     Pertinent History  85 year old female with adenocarcinoma of the cervix as well as endometrial carcinoma and polycythemia vera as well as diabetes who had hypertensive right cerebellar hemorrhage onset 10/28/2017.  She was treated initially at [facility] and then transferred to [facility].  Neurosurgical evaluation per Dr. Ronnald Ramp concluded no surgery was needed.  The patient completed inpatient rehabilitation at [facility] (including SLP) 11/04/2017-11/18/2107 and was discharged at a 24-hour supervision level.  The patient has hired caregiver to provide 24-hour supervision. Patient has  received home health rehab services, including SLP.     Patient Stated Goals  Patient would like to be independent in all tasks and be able to live alone again.    Currently in Pain?  No/denies    Pain Score  0-No pain        ADL: Patient seen this date for handwriting skills with printing name, cursive name as well as address for multiple trials.  Patient given paper with large lines and use of regular pen.   Legibility fair this date, occasional cues for prehension patterns with grasp on pen.    Neuromuscular Reeducation:  Patient seen for manipulation of 100 pegboard pieces to  pick up and turn/flip from one side to the other end.  Focused on speed and dexterity of the task.    Therapeutic exercise:   Patient seen for use of push pins placing into minimal resistance board, unable to complete moderate resistance board.  Cues for prehension patterns to hold pins and push into board, no difficulty with removing pins.     Goals updated to reflect current progress.  Patient continues to make progress in all areas and continues to benefit from skilled OT services to maximize safety and independence in daily tasks.                      OT Education - 08/11/18 1024    Education Details  handwriting    Person(s) Educated  Patient    Methods  Explanation;Demonstration;Verbal cues    Comprehension  Verbalized understanding;Returned demonstration;Verbal cues required          OT Long Term Goals - 08/11/18 1025      OT LONG TERM GOAL #1   Title  Patient will complete bathing with modified independence    Baseline  1/14/20120: Pt. continues to have difficulty with washing the bottom of her feet, however is able to complete it.    Time  12    Period  Weeks    Status  Partially Met    Target Date  09/15/18      OT LONG TERM GOAL #2   Title  Patient will complete dressing skills with modified independence    Baseline  07/05/2018: Pt. conitnues to require assist with hooking her bra. Pt. requires care giver assist secondary to requiring increased time to complete.    Time  12    Period  Weeks    Status  On-going    Target Date  09/15/18      OT LONG TERM GOAL #3   Title  Patient will complete light meal prep with min assist    Baseline  07/05/2018: Pt. is able to fix herself something light to eat, However requires assist formeal preparation.    Time  12    Period  Weeks    Status  On-going    Target Date  09/15/18      OT LONG TERM GOAL #4   Title  Patient will increase R grip strength by 5# to open jars and containers with modified independence     Baseline  07/05/2018: Grip is improving, however requires assist to open containers.    Time  12    Period  Weeks    Status  On-going    Target Date  09/15/18      OT LONG TERM GOAL #5   Title  Patient will improve strength by 1 mm grade RUE to assist with obtaining items  from the closet.     Baseline  07/05/2018: Pt. has made progress, and has difficulty reaching for items in the closet.    Time  8    Period  Weeks    Status  On-going    Target Date  09/15/18      OT LONG TERM GOAL #6   Title  Pt will increase handwriting legibility and speed to 50% while writing a 3 sentence paragraph in order to write thank you notes and birthday cards for friends and family    Baseline  07/05/2018: 50% writing lebility for one sentence with increased time.    Time  5    Period  Weeks    Status  On-going    Target Date  09/15/18            Plan - 08/11/18 1025    Clinical Impression Statement  Patient improving with handwriting skills and improved legibilty.  Patient continues to make progress in all areas and continues to benefit from skilled OT services to maximize safety and independence in daily tasks.     Occupational Profile and client history currently impacting functional performance  family more than 30 mins away, has 24 hour caregiver assist, decreased awareness, memory and safety    Occupational performance deficits (Please refer to evaluation for details):  ADL's;IADL's;Social Participation    Rehab Potential  Good    Current Impairments/barriers affecting progress:  memory, requires 24 hour caregivers,     OT Frequency  2x / week    OT Duration  12 weeks    OT Treatment/Interventions  Self-care/ADL training;Cryotherapy;Therapeutic exercise;DME and/or AE instruction;Functional Mobility Training;Cognitive remediation/compensation;Balance training;Neuromuscular education;Manual Therapy;Moist Heat;Therapeutic activities;Patient/family education    Consulted and Agree with Plan of  Care  Patient       Patient will benefit from skilled therapeutic intervention in order to improve the following deficits and impairments:  Decreased balance, Decreased mobility, Difficulty walking, Decreased cognition, Decreased activity tolerance, Decreased coordination, Decreased strength, Impaired UE functional use  Visit Diagnosis: Muscle weakness (generalized)  Other lack of coordination    Problem List Patient Active Problem List   Diagnosis Date Noted  . Liver lesion 04/22/2018  . Goals of care, counseling/discussion 04/22/2018  . Cognitive deficit, post-stroke 12/31/2017  . Ataxia, post-stroke 12/31/2017  . Dysarthria, post-stroke   . Gait disturbance, post-stroke   . H/O cerebral parenchymal hemorrhage 11/04/2017  . Essential hypertension 11/04/2017  . Hyperlipidemia 11/04/2017  . Diabetes ([place]) 11/04/2017  . CAD (coronary artery disease) 11/04/2017  . 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 Hypokalemia 11/04/2017  . Right-sided nontraumatic intracerebral hemorrhage of cerebellum ([place])   . History of cervical cancer   . History of TIA (transient ischemic attack)   . Acute systolic congestive heart failure ([place])   . Reactive hypertension   . Hypernatremia   . Leukocytosis   . Acute blood loss anemia   . Elevated serum creatinine   . Acute respiratory failure with hypoxia ([place])   . IVH (intraventricular hemorrhage) ([place]) 10/29/2017  . Hypoxia 10/28/2017  . Pancreatic lesion 05/24/2017  . 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 Nodule of upper lobe of left lung 06/04/2015  . 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 Cerebral thrombosis with cerebral infarction 04/03/2015  . Cancer of right colon ([place]) 03/28/2015  . CVA (cerebral infarction) 12/21/2014  . Polycythemia vera ([place]) 06/22/2006   Tiffney Haughton T Tobie Hellen, OTR/L, CLT  Elesha Thedford 08/11/2018, 10:35 AM  [place]  [facility] MAIN [facility] SERVICES [address] [place], [place], [address] Phone: [phone]   Fax:  [phone]  Name: [name] MRN: [id number] Date of Birth: [birth year]

## 2018-08-11 NOTE — Therapy (Signed)
[place] MAIN [facility] SERVICES [address] [place], [place], [address] Phone: [phone]   Fax:  [phone]  Occupational Therapy Treatment  Patient Details  Name: [name] MRN: [id number] Date of Birth: 07-Jul-1931 No data recorded  Encounter Date: 08/11/2018  OT End of Session - 08/11/18 1157    Visit Number  41    Number of Visits  73    Date for OT Re-Evaluation  09/15/18    Authorization Type  Medicare    Authorization Time Period  Next progress report period starting 08/09/2018    OT Start Time  1145    OT Stop Time  1230    OT Time Calculation (min)  45 min    Activity Tolerance  Patient tolerated treatment well    Behavior During Therapy  [facility] for tasks assessed/performed       Past Medical History:  Diagnosis Date  . Adenocarcinoma in situ of cervix   . Arthritis    hands  . Breast cancer ([place])   . Colon cancer ([place])   . Diabetes mellitus without complication ([place])   . Endometrial carcinoma (HCC)    s/p total abdominal hysterectomy  . H/O compression fracture of spine 2014   thoracic spine  . H/O polycythemia vera   . H/O TIA (transient ischemic attack) and stroke 09/2014, 03/2015   No deficits  . Hypertension   . Hyperuricemia   . Microalbuminuria   . Polycythemia vera ([place])   . Recurrent falls   . Skin cancer    face, legs  . Stroke [facility]) 2008   no deficits  . Trochanteric bursitis   . Varicose veins    treated    Past Surgical History:  Procedure Laterality Date  . ABDOMINAL HYSTERECTOMY    . BOWEL RESECTION N/A 03/28/2015   Procedure: SMALL BOWEL RESECTION;  Surgeon: Leonie Green, MD;  Location: [facility] ORS;  Service: General;  Laterality: N/A;  . CATARACT EXTRACTION W/ INTRAOCULAR LENS IMPLANT Right   . CATARACT EXTRACTION W/PHACO Left 10/07/2015   Procedure: CATARACT EXTRACTION PHACO AND INTRAOCULAR LENS PLACEMENT (IOC);  Surgeon: Ronnell Freshwater, MD;  Location: [place];   Service: Ophthalmology;  Laterality: Left;  DIABETIC - oral meds VISION BLUE  . CORONARY ANGIOGRAPHY N/A 10/29/2017   Procedure: CORONARY ANGIOGRAPHY;  Surgeon: Dionisio David, MD;  Location: [place] CV LAB;  Service: Cardiovascular;  Laterality: N/A;  . EXPLORATORY LAPAROTOMY     for fibroids  . LEFT HEART CATH Right 10/29/2017   Procedure: Left Heart Cath;  Surgeon: Dionisio David, MD;  Location: [place] CV LAB;  Service: Cardiovascular;  Laterality: Right;  . TONSILLECTOMY      There were no vitals filed for this visit.  Subjective Assessment - 08/11/18 1153    Subjective   Pt. reports that he hand is feeling bettter.     Pertinent History  85 year old female with adenocarcinoma of the cervix as well as endometrial carcinoma and polycythemia vera as well as diabetes who had hypertensive right cerebellar hemorrhage onset 10/28/2017.  She was treated initially at [facility] and then transferred to [facility].  Neurosurgical evaluation per Dr. Ronnald Ramp concluded no surgery was needed.  The patient completed inpatient rehabilitation at [facility] (including SLP) 11/04/2017-11/18/2107 and was discharged at a 24-hour supervision level.  The patient has hired caregiver to provide 24-hour supervision. Patient has received home health rehab services, including SLP.  Patient Stated Goals  Patient would like to be independent in all tasks and be able to live alone again.    Currently in Pain?  No/denies    Pain Onset  More than a month ago      OT TREATMENT    Selfcare:  Pt. worked on Estate agent tasks, and Mudlogger using simulated personal checks, and filling out a Engineer, petroleum. Pt. required increased cues for filling out a check register. Pt. required cues, and assist to navigate, and complete the check register. Pt. required cues for the date on the checks. Pt. presented with 50% writing legibility in cursive form with increased time to complete.                        OT Education - 08/11/18 1157    Education Details  handwriting skills    Person(s) Educated  Patient    Methods  Explanation;Demonstration;Verbal cues    Comprehension  Verbalized understanding;Returned demonstration;Verbal cues required          OT Long Term Goals - 08/11/18 1025      OT LONG TERM GOAL #1   Title  Patient will complete bathing with modified independence    Baseline  1/14/20120: Pt. continues to have difficulty with washing the bottom of her feet, however is able to complete it.    Time  12    Period  Weeks    Status  Partially Met    Target Date  09/15/18      OT LONG TERM GOAL #2   Title  Patient will complete dressing skills with modified independence    Baseline  07/05/2018: Pt. conitnues to require assist with hooking her bra. Pt. requires care giver assist secondary to requiring increased time to complete.    Time  12    Period  Weeks    Status  On-going    Target Date  09/15/18      OT LONG TERM GOAL #3   Title  Patient will complete light meal prep with min assist    Baseline  07/05/2018: Pt. is able to fix herself something light to eat, However requires assist formeal preparation.    Time  12    Period  Weeks    Status  On-going    Target Date  09/15/18      OT LONG TERM GOAL #4   Title  Patient will increase R grip strength by 5# to open jars and containers with modified independence    Baseline  07/05/2018: Grip is improving, however requires assist to open containers.    Time  12    Period  Weeks    Status  On-going    Target Date  09/15/18      OT LONG TERM GOAL #5   Title  Patient will improve strength by 1 mm grade RUE to assist with obtaining items from the closet.     Baseline  07/05/2018: Pt. has made progress, and has difficulty reaching for items in the closet.    Time  8    Period  Weeks    Status  On-going    Target Date  09/15/18      OT LONG TERM GOAL #6   Title  Pt will increase  handwriting legibility and speed to 50% while writing a 3 sentence paragraph in order to write thank you notes and birthday cards for friends and family    Baseline  07/05/2018: 50% writing lebility for one sentence with increased time.    Time  5    Period  Weeks    Status  On-going    Target Date  09/15/18            Plan - 08/11/18 1159    Clinical Impression Statement  Pt. continues to improve with handwriting skills, and presents with improved writing legibility in printed form.  Pt. continues to work on improving writing legibility in cursive form. Pt. conitnue sto work on improving UE strength, hand function, and [place] skills for improvd  engagement in ADL, and IADL tasks    Occupational Profile and client history currently impacting functional performance  family more than 30 mins away, has 24 hour caregiver assist, decreased awareness, memory and safety    Occupational performance deficits (Please refer to evaluation for details):  ADL's;IADL's;Social Participation    Rehab Potential  Good    Current Impairments/barriers affecting progress:  memory, requires 24 hour caregivers,     OT Frequency  2x / week    OT Duration  12 weeks    OT Treatment/Interventions  Self-care/ADL training;Cryotherapy;Therapeutic exercise;DME and/or AE instruction;Functional Mobility Training;Cognitive remediation/compensation;Balance training;Neuromuscular education;Manual Therapy;Moist Heat;Therapeutic activities;Patient/family education    Clinical Decision Making  Several treatment options, min-mod task modification necessary    Consulted and Agree with Plan of Care  Patient    Family Member Consulted  Caregiver       Patient will benefit from skilled therapeutic intervention in order to improve the following deficits and impairments:  Decreased balance, Decreased mobility, Difficulty walking, Decreased cognition, Decreased activity tolerance, Decreased coordination, Decreased strength, Impaired UE  functional use  Visit Diagnosis: Muscle weakness (generalized)  Other lack of coordination    Problem List Patient Active Problem List   Diagnosis Date Noted  . Liver lesion 04/22/2018  . Goals of care, counseling/discussion 04/22/2018  . Cognitive deficit, post-stroke 12/31/2017  . Ataxia, post-stroke 12/31/2017  . Dysarthria, post-stroke   . Gait disturbance, post-stroke   . H/O cerebral parenchymal hemorrhage 11/04/2017  . Essential hypertension 11/04/2017  . Hyperlipidemia 11/04/2017  . Diabetes ([place]) 11/04/2017  . CAD (coronary artery disease) 11/04/2017  . 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 Hypokalemia 11/04/2017  . Right-sided nontraumatic intracerebral hemorrhage of cerebellum ([place])   . History of cervical cancer   . History of TIA (transient ischemic attack)   . Acute systolic congestive heart failure ([place])   . Reactive hypertension   . Hypernatremia   . Leukocytosis   . Acute blood loss anemia   . Elevated serum creatinine   . Acute respiratory failure with hypoxia ([place])   . IVH (intraventricular hemorrhage) ([place]) 10/29/2017  . Hypoxia 10/28/2017  . Pancreatic lesion 05/24/2017  . 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 Nodule of upper lobe of left lung 06/04/2015  . 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 Cerebral thrombosis with cerebral infarction 04/03/2015  . Cancer of right colon ([place]) 03/28/2015  . CVA (cerebral infarction) 12/21/2014  . Polycythemia vera ([place]) 06/22/2006    Harrel Carina, MS, OTR/L 08/11/2018, 12:13 PM  [place] MAIN [facility] SERVICES [address] [place], [place], [address] Phone: [phone]   Fax:  [phone]  Name: [name] MRN: [id number] Date of Birth: March 04, 1932

## 2018-08-11 NOTE — Therapy (Signed)
[place] MAIN [facility] SERVICES [address] [place], [place], [address] Phone: [phone]   Fax:  [phone]  Physical Therapy Treatment  Patient Details  Name: [name] MRN: [id number] Date of Birth: 01-May-1932 Referring Provider (PT): Charlett Blake   Encounter Date: 08/11/2018  PT End of Session - 08/11/18 1106    Visit Number  36    Number of Visits  28    Date for PT Re-Evaluation  08/16/18    Authorization Type  6/10 start of care 01/31/18, last goals 07/19/18     PT Start Time  1101    PT Stop Time  1142    PT Time Calculation (min)  41 min    Equipment Utilized During Treatment  Gait belt    Activity Tolerance  Patient tolerated treatment well;No increased pain    Behavior During Therapy  WFL for tasks assessed/performed       Past Medical History:  Diagnosis Date  . Adenocarcinoma in situ of cervix   . Arthritis    hands  . Breast cancer ([place])   . Colon cancer ([place])   . Diabetes mellitus without complication ([place])   . Endometrial carcinoma (HCC)    s/p total abdominal hysterectomy  . H/O compression fracture of spine 2014   thoracic spine  . H/O polycythemia vera   . H/O TIA (transient ischemic attack) and stroke 09/2014, 03/2015   No deficits  . Hypertension   . Hyperuricemia   . Microalbuminuria   . Polycythemia vera ([place])   . Recurrent falls   . Skin cancer    face, legs  . Stroke [facility]) 2008   no deficits  . Trochanteric bursitis   . Varicose veins    treated    Past Surgical History:  Procedure Laterality Date  . ABDOMINAL HYSTERECTOMY    . BOWEL RESECTION N/A 03/28/2015   Procedure: SMALL BOWEL RESECTION;  Surgeon: Leonie Green, MD;  Location: [facility] ORS;  Service: General;  Laterality: N/A;  . CATARACT EXTRACTION W/ INTRAOCULAR LENS IMPLANT Right   . CATARACT EXTRACTION W/PHACO Left 10/07/2015   Procedure: CATARACT EXTRACTION PHACO AND INTRAOCULAR LENS PLACEMENT (IOC);  Surgeon: Ronnell Freshwater, MD;  Location: [place];  Service: Ophthalmology;  Laterality: Left;  DIABETIC - oral meds VISION BLUE  . CORONARY ANGIOGRAPHY N/A 10/29/2017   Procedure: CORONARY ANGIOGRAPHY;  Surgeon: Dionisio David, MD;  Location: [place] CV LAB;  Service: Cardiovascular;  Laterality: N/A;  . EXPLORATORY LAPAROTOMY     for fibroids  . LEFT HEART CATH Right 10/29/2017   Procedure: Left Heart Cath;  Surgeon: Dionisio David, MD;  Location: [place] CV LAB;  Service: Cardiovascular;  Laterality: Right;  . TONSILLECTOMY      There were no vitals filed for this visit.  Subjective Assessment - 08/11/18 1104    Subjective  Patient denies any new falls, says she took her tylenol prior to session today.     Pertinent History    85 year old female with adenocarcinoma of the cervix as well as endometrial carcinoma and polycythemia vera as well as diabetes who had hypertensive right cerebellar hemorrhage onset 10/28/2017.  She was treated initially at [facility] and then transferred to [facility].  Neurosurgical evaluation per Dr. Ronnald Ramp concluded no surgery was needed.  The patient completed inpatient rehabilitation at [facility] (including SLP) 11/04/2017-11/18/2107 and was discharged at a 24-hour supervision level.  The patient has hired  caregiver to provide 24-hour supervision. Patient has received home health rehab services, including SLP.    Limitations  Standing;Walking    How long can you walk comfortably?  a few minutes    Patient Stated Goals  Patient wants to walk better and not need the RW.     Currently in Pain?  No/denies       TREATMENT:     Ther-ex  Seated marches x 15 bilateral; Seated clams with manual resistance x 20; Seated adductor squeeze with manual resistance x 20; Seated heel raises with manual resistance x 20; Seated LAQ with manual resistance x 20; Seated HS curls with green tband resistance x 20; Sit<>Stand with 4# yellow  ball overhead lift x 6 reps, and then reps from regular height chair with Airex pad on seat CGA for safety and min VCs to increase forward weight shift for better transfer ability;      Neuromuscular Re-education  Forward/backward ambulation without UE support x 4 laps; CGA-modAx1 Sidestepping without UE support x 4 laps; CGA Airex balance WBOS/NBOS eyes open/closed x 230s in each condition; CGA Airex balance with 6" step taps alternating LE x 10 each; CGA    Pt educated throughout session about proper posture and technique with exercises. Improved exercise technique, movement at target joints, use of target muscles after min to mod verbal, visual, tactile cues.      PT Education - 08/11/18 1105    Education Details  LE strengthening/balance    Person(s) Educated  Patient    Methods  Explanation;Verbal cues    Comprehension  Verbalized understanding;Returned demonstration;Verbal cues required       PT Short Term Goals - 07/19/18 1123      PT SHORT TERM GOAL #1   Title  Patient will reduce timed up and go to <11 seconds to reduce fall risk and demonstrate improved transfer/gait ability.    Baseline  22.07 sec; 03/24/18: 14.2sec; 04/25/18: 14.0 sec with RW, 06/01/18: 12.7s, 07/19/18: 11.12 sec    Time  2    Period  Weeks    Status  Achieved      PT SHORT TERM GOAL #2   Title  Patient will be independent with ascend/descend 2 steps using single UE in step over step pattern without LOB.    Baseline  03/24/18: CGA, single UE support, step-to pattern without LOB; 04/25/18: CGA, single UE support, step-over-step pattern without LOB; 06/01/18: CGA, single UE support; 07/19/18: supervision with 1 rail, but does step to pattern descending;     Time  2    Period  Weeks    Status  Partially Met    Target Date  08/02/18        PT Long Term Goals - 07/19/18 1124      PT LONG TERM GOAL #1   Title  Patient will be independent in home exercise program to improve strength/mobility for better  functional independence with ADLs.    Baseline  04/25/18: unsure but does not remember doing HEP lately; 06/01/18: Pt performs intermittently    Time  8    Period  Weeks    Status  Achieved      PT LONG TERM GOAL #2   Title  Patient (> 31 years old) will complete five times sit to stand test in < 15 seconds indicating an increased LE strength and improved balance    Baseline  34.21 sec; 03/24/2018: 22.9 sec; 04/25/18: 18.5 sec with BUE support; 06/01/18: 15.5s with BUE support,  still unable to perform STS without UE support; 07/19/18: 20 sec with 1 HHA    Time  4    Period  Weeks    Status  Not Met    Target Date  08/16/18      PT LONG TERM GOAL #3   Title  Patient will increase six minute walk test distance to >1000 for progression to community ambulator and improve gait ability    Baseline  550 feet; 03/24/18: 795 ft.; 04/25/18: 975 ft with RW; 06/01/18: 1040' with RW; 07/19/18: 1050 without AD    Time  8    Period  Weeks    Status  Achieved      PT LONG TERM GOAL #4   Title  Patient will increase 10 meter walk test to >1.58ms as to improve gait speed for better community ambulation and to reduce fall risk    Baseline  .49 m/sec; 03/24/18: 1.01 m/sec; 06/01/18: self-selected: 9.7s = 1.03 m/s, fastest: 8.0s = 1.25 m/s    Period  Weeks    Status  Achieved      PT LONG TERM GOAL #5   Title  Pt will improve BERG by at least 3 points in order to demonstrate clinically significant improvement in balance.      Baseline  06/01/18: 44/56    Time  4    Period  Weeks    Status  Partially Met    Target Date  08/16/18            Plan - 08/11/18 1247    Clinical Impression Statement  Patient with excellent motivation during session. Patient demonstrated fatigue after exercises and with ambulation activities. 1-2 instances of moderate LOB with backward ambulation, PT min-modAx1 to correct. Patient required verbal and visual cues to perform exercises. The patient would benefit from further  skilled PT to continue to progress towards goals.    Rehab Potential  Fair    PT Frequency  2x / week    PT Duration  4 weeks    PT Treatment/Interventions  Patient/family education;Neuromuscular re-education;Balance training;Stair training;Therapeutic activities;Therapeutic exercise;Gait training;Aquatic Therapy    PT Next Visit Plan  balance and strengthening    PT Home Exercise Plan  unable to provide due to short Evaluation session scheduled    Consulted and Agree with Plan of Care  Patient       Patient will benefit from skilled therapeutic intervention in order to improve the following deficits and impairments:  Abnormal gait, Decreased balance, Decreased endurance, Decreased mobility, Impaired flexibility, Decreased strength, Decreased knowledge of use of DME, Decreased activity tolerance, Difficulty walking  Visit Diagnosis: Muscle weakness (generalized)  Difficulty in walking, not elsewhere classified  Other lack of coordination     Problem List Patient Active Problem List   Diagnosis Date Noted  . Liver lesion 04/22/2018  . Goals of care, counseling/discussion 04/22/2018  . Cognitive deficit, post-stroke 12/31/2017  . Ataxia, post-stroke 12/31/2017  . Dysarthria, post-stroke   . Gait disturbance, post-stroke   . H/O cerebral parenchymal hemorrhage 11/04/2017  . Essential hypertension 11/04/2017  . Hyperlipidemia 11/04/2017  . Diabetes ([place] 11/04/2017  . CAD (coronary artery disease) 11/04/2017  . 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 Hypokalemia 11/04/2017  . Right-sided nontraumatic intracerebral hemorrhage of cerebellum ([place]   . History of cervical cancer   . History of TIA (transient ischemic attack)   . Acute systolic congestive heart failure ([place]   . Reactive hypertension   . Hypernatremia   .  Leukocytosis   . Acute blood loss anemia   . Elevated serum creatinine   . Acute respiratory failure with hypoxia ([place])   . IVH (intraventricular  hemorrhage) ([place]) 10/29/2017  . Hypoxia 10/28/2017  . Pancreatic lesion 05/24/2017  . 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 Nodule of upper lobe of left lung 06/04/2015  . 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 Cerebral thrombosis with cerebral infarction 04/03/2015  . Cancer of right colon ([place]) 03/28/2015  . CVA (cerebral infarction) 12/21/2014  . Polycythemia vera ([place]) 06/22/2006    Lieutenant Diego PT, DPT 12:51 PM,08/11/18 [phone]  [place] MAIN [facility] SERVICES [address] [place], [place], [address] Phone: [phone]   Fax:  [phone]  Name: [name] MRN: [id number] Date of Birth: [birth year]

## 2018-08-16 ENCOUNTER — Ambulatory Visit: Payer: Medicare Other | Admitting: Physical Therapy

## 2018-08-16 ENCOUNTER — Ambulatory Visit: Payer: Medicare Other | Admitting: Occupational Therapy

## 2018-08-16 ENCOUNTER — Encounter: Payer: Self-pay | Admitting: Physical Therapy

## 2018-08-16 DIAGNOSIS — M6281 Muscle weakness (generalized): Secondary | ICD-10-CM

## 2018-08-16 DIAGNOSIS — R41841 Cognitive communication deficit: Secondary | ICD-10-CM

## 2018-08-16 DIAGNOSIS — R262 Difficulty in walking, not elsewhere classified: Secondary | ICD-10-CM

## 2018-08-16 DIAGNOSIS — R278 Other lack of coordination: Secondary | ICD-10-CM

## 2018-08-16 DIAGNOSIS — I69123 Fluency disorder following nontraumatic intracerebral hemorrhage: Secondary | ICD-10-CM

## 2018-08-16 NOTE — Therapy (Addendum)
[place] MAIN [facility] SERVICES [address] [place], [place], [address] Phone: [phone]   Fax:  [phone]  Occupational Therapy Treatment  Patient Details  Name: [name] MRN: [id number] Date of Birth: [birth year] No data recorded  Encounter Date: 08/16/2018  OT End of Session - 08/16/18 1150    Visit Number  42    Number of Visits  62    Date for OT Re-Evaluation  09/15/18    Authorization Type  Medicare    Authorization Time Period  Next progress report period starting 08/09/2018    OT Start Time  1145    OT Stop Time  1230    OT Time Calculation (min)  45 min    Activity Tolerance  Patient tolerated treatment well    Behavior During Therapy  [facility] for tasks assessed/performed       Past Medical History:  Diagnosis Date  . Adenocarcinoma in situ of cervix   . Arthritis    hands  . Breast cancer ([place])   . Colon cancer ([place])   . Diabetes mellitus without complication ([place])   . Endometrial carcinoma (HCC)    s/p total abdominal hysterectomy  . H/O compression fracture of spine 2014   thoracic spine  . H/O polycythemia vera   . H/O TIA (transient ischemic attack) and stroke 09/2014, 03/2015   No deficits  . Hypertension   . Hyperuricemia   . Microalbuminuria   . Polycythemia vera ([place])   . Recurrent falls   . Skin cancer    face, legs  . Stroke [facility]) 2008   no deficits  . Trochanteric bursitis   . Varicose veins    treated    Past Surgical History:  Procedure Laterality Date  . ABDOMINAL HYSTERECTOMY    . BOWEL RESECTION N/A 03/28/2015   Procedure: SMALL BOWEL RESECTION;  Surgeon: Leonie Green, MD;  Location: [facility] ORS;  Service: General;  Laterality: N/A;  . CATARACT EXTRACTION W/ INTRAOCULAR LENS IMPLANT Right   . CATARACT EXTRACTION W/PHACO Left 10/07/2015   Procedure: CATARACT EXTRACTION PHACO AND INTRAOCULAR LENS PLACEMENT (IOC);  Surgeon: Ronnell Freshwater, MD;  Location: [place];   Service: Ophthalmology;  Laterality: Left;  DIABETIC - oral meds VISION BLUE  . CORONARY ANGIOGRAPHY N/A 10/29/2017   Procedure: CORONARY ANGIOGRAPHY;  Surgeon: Dionisio David, MD;  Location: [place] CV LAB;  Service: Cardiovascular;  Laterality: N/A;  . EXPLORATORY LAPAROTOMY     for fibroids  . LEFT HEART CATH Right 10/29/2017   Procedure: Left Heart Cath;  Surgeon: Dionisio David, MD;  Location: [place] CV LAB;  Service: Cardiovascular;  Laterality: Right;  . TONSILLECTOMY      There were no vitals filed for this visit.   OT TREATMENT    Selfcare:  Pt. worked on Acupuncturist items in response to questions about an menu. Pt. worked on Estate agent in printed form with 75% legibility, and cursive form with 50% legibility.                           OT Education - 08/16/18 1149    Education Details  handwriting    Person(s) Educated  Patient    Methods  Explanation;Demonstration;Verbal cues    Comprehension  Verbalized understanding;Returned demonstration;Verbal cues required          OT Long Term Goals - 08/11/18 1025  OT LONG TERM GOAL #1   Title  Patient will complete bathing with modified independence    Baseline  1/14/20120: Pt. continues to have difficulty with washing the bottom of her feet, however is able to complete it.    Time  12    Period  Weeks    Status  Partially Met    Target Date  09/15/18      OT LONG TERM GOAL #2   Title  Patient will complete dressing skills with modified independence    Baseline  07/05/2018: Pt. conitnues to require assist with hooking her bra. Pt. requires care giver assist secondary to requiring increased time to complete.    Time  12    Period  Weeks    Status  On-going    Target Date  09/15/18      OT LONG TERM GOAL #3   Title  Patient will complete light meal prep with min assist    Baseline  07/05/2018: Pt. is able to fix herself something light to eat, However  requires assist formeal preparation.    Time  12    Period  Weeks    Status  On-going    Target Date  09/15/18      OT LONG TERM GOAL #4   Title  Patient will increase R grip strength by 5# to open jars and containers with modified independence    Baseline  07/05/2018: Grip is improving, however requires assist to open containers.    Time  12    Period  Weeks    Status  On-going    Target Date  09/15/18      OT LONG TERM GOAL #5   Title  Patient will improve strength by 1 mm grade RUE to assist with obtaining items from the closet.     Baseline  07/05/2018: Pt. has made progress, and has difficulty reaching for items in the closet.    Time  8    Period  Weeks    Status  On-going    Target Date  09/15/18      OT LONG TERM GOAL #6   Title  Pt will increase handwriting legibility and speed to 50% while writing a 3 sentence paragraph in order to write thank you notes and birthday cards for friends and family    Baseline  07/05/2018: 50% writing lebility for one sentence with increased time.    Time  5    Period  Weeks    Status  On-going    Target Date  09/15/18            Plan - 08/16/18 1151    Clinical Impression:     Occupational Profile and client history currently impacting functional performance Pt. continues to make progress with handwriting skills. Pt. continues to present with improving writing legibility in printed form. Pt. continues to work on improving UE strength, hand function, and [facility] skills in order to improve writing, and writing legibility skills..    family more than 30 mins away, has 24 hour caregiver assist, decreased awareness, memory and safety    Occupational performance deficits (Please refer to evaluation for details):  ADL's;IADL's;Social Participation    Rehab Potential  Good    Current Impairments/barriers affecting progress:  memory, requires 24 hour caregivers,     OT Frequency  2x / week    OT Duration  12 weeks    OT  Treatment/Interventions  Self-care/ADL training;Cryotherapy;Therapeutic exercise;DME and/or AE instruction;Functional Mobility Training;Cognitive  remediation/compensation;Balance training;Neuromuscular education;Manual Therapy;Moist Heat;Therapeutic activities;Patient/family education    Clinical Decision Making  Several treatment options, min-mod task modification necessary    Consulted and Agree with Plan of Care  Patient    Family Member Consulted  Caregiver       Patient will benefit from skilled therapeutic intervention in order to improve the following deficits and impairments:  Decreased balance, Decreased mobility, Difficulty walking, Decreased cognition, Decreased activity tolerance, Decreased coordination, Decreased strength, Impaired UE functional use  Visit Diagnosis: Muscle weakness (generalized)  Other lack of coordination    Problem List Patient Active Problem List   Diagnosis Date Noted  . Liver lesion 04/22/2018  . Goals of care, counseling/discussion 04/22/2018  . Cognitive deficit, post-stroke 12/31/2017  . Ataxia, post-stroke 12/31/2017  . Dysarthria, post-stroke   . Gait disturbance, post-stroke   . H/O cerebral parenchymal hemorrhage 11/04/2017  . Essential hypertension 11/04/2017  . Hyperlipidemia 11/04/2017  . Diabetes ([place]) 11/04/2017  . CAD (coronary artery disease) 11/04/2017  . 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 Hypokalemia 11/04/2017  . Right-sided nontraumatic intracerebral hemorrhage of cerebellum ([place])   . History of cervical cancer   . History of TIA (transient ischemic attack)   . Acute systolic congestive heart failure ([place])   . Reactive hypertension   . Hypernatremia   . Leukocytosis   . Acute blood loss anemia   . Elevated serum creatinine   . Acute respiratory failure with hypoxia ([place])   . IVH (intraventricular hemorrhage) ([place]) 10/29/2017  . Hypoxia 10/28/2017  . Pancreatic lesion 05/24/2017  . Carcinoid tumor of colon  04/23/2016  . Nodule of upper lobe of left lung 06/04/2015  . 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 Cerebral thrombosis with cerebral infarction 04/03/2015  . Cancer of right colon ([place]) 03/28/2015  . CVA (cerebral infarction) 12/21/2014  . Polycythemia vera ([place]) 06/22/2006    Harrel Carina, MS, OTR/L 08/16/2018, 12:27 PM  [place] MAIN [facility] SERVICES [address] [place], [place], [address] Phone: [phone]   Fax:  [phone]  Name: [name] MRN: [id number] Date of Birth: 1932-05-24

## 2018-08-16 NOTE — Therapy (Signed)
[place] MAIN [facility] SERVICES [address] [place], [place], [address] Phone: [phone]   Fax:  [phone]  Physical Therapy Treatment/Re-certification  Patient Details  Name: BETHANI BRUGGER MRN: [id number] Date of Birth: 09-04-1931 Referring Provider (PT): Charlett Blake   Encounter Date: 08/16/2018  PT End of Session - 08/16/18 1106    Visit Number  37    Number of Visits  25    Date for PT Re-Evaluation  10/11/18    Authorization Type  7/10 start of care 01/31/18, last goals 07/19/18     PT Start Time  1100    PT Stop Time  1145    PT Time Calculation (min)  45 min    Equipment Utilized During Treatment  Gait belt    Activity Tolerance  Patient tolerated treatment well;No increased pain    Behavior During Therapy  WFL for tasks assessed/performed       Past Medical History:  Diagnosis Date  . Adenocarcinoma in situ of cervix   . Arthritis    hands  . Breast cancer ([place])   . Colon cancer ([place])   . Diabetes mellitus without complication ([place])   . Endometrial carcinoma (HCC)    s/p total abdominal hysterectomy  . H/O compression fracture of spine 2014   thoracic spine  . H/O polycythemia vera   . H/O TIA (transient ischemic attack) and stroke 09/2014, 03/2015   No deficits  . Hypertension   . Hyperuricemia   . Microalbuminuria   . Polycythemia vera ([place])   . Recurrent falls   . Skin cancer    face, legs  . Stroke [facility]) 2008   no deficits  . Trochanteric bursitis   . Varicose veins    treated    Past Surgical History:  Procedure Laterality Date  . ABDOMINAL HYSTERECTOMY    . BOWEL RESECTION N/A 03/28/2015   Procedure: SMALL BOWEL RESECTION;  Surgeon: Leonie Green, MD;  Location: [facility] ORS;  Service: General;  Laterality: N/A;  . CATARACT EXTRACTION W/ INTRAOCULAR LENS IMPLANT Right   . CATARACT EXTRACTION W/PHACO Left 10/07/2015   Procedure: CATARACT EXTRACTION PHACO AND INTRAOCULAR LENS PLACEMENT (IOC);   Surgeon: Ronnell Freshwater, MD;  Location: [place];  Service: Ophthalmology;  Laterality: Left;  DIABETIC - oral meds VISION BLUE  . CORONARY ANGIOGRAPHY N/A 10/29/2017   Procedure: CORONARY ANGIOGRAPHY;  Surgeon: Dionisio David, MD;  Location: [place] CV LAB;  Service: Cardiovascular;  Laterality: N/A;  . EXPLORATORY LAPAROTOMY     for fibroids  . LEFT HEART CATH Right 10/29/2017   Procedure: Left Heart Cath;  Surgeon: Dionisio David, MD;  Location: [place] CV LAB;  Service: Cardiovascular;  Laterality: Right;  . TONSILLECTOMY      There were no vitals filed for this visit.  Subjective Assessment - 08/16/18 1106    Subjective  Patient denies any new falls, says she took her tylenol prior to session today.     Pertinent History    85 year old female with adenocarcinoma of the cervix as well as endometrial carcinoma and polycythemia vera as well as diabetes who had hypertensive right cerebellar hemorrhage onset 10/28/2017.  She was treated initially at [facility] and then transferred to [facility].  Neurosurgical evaluation per Dr. Ronnald Ramp concluded no surgery was needed.  The patient completed inpatient rehabilitation at [facility] (including SLP) 11/04/2017-11/18/2107 and was discharged at a 24-hour supervision level.  The patient has hired  caregiver to provide 24-hour supervision. Patient has received home health rehab services, including SLP.    Limitations  Standing;Walking    How long can you walk comfortably?  a few minutes    Patient Stated Goals  Patient wants to walk better and not need the RW.     Currently in Pain?  No/denies    Pain Score  0-No pain    Pain Onset  More than a month ago       Treatment: Bridges x 10  hooklying abd/ER with BTB x 20  Hooklying marching with 2 lbs x 20  Leg press x 90 lbs x 20 x 2  Standing heel raises x 20  Therapeutic activities; Berg balance test 5 x sit to stand test  CGA and  mod verbal  cues used throughout with increased in postural sway and LOB most seen with narrow base of support and while on uneven surfaces. Continues to have balance deficits typical with diagnosis. Patient performs intermediate level exercises without pain behaviors and needs verbal cuing for postural alignment and head positioning                        PT Education - 08/16/18 1106    Education Details  HEP    Person(s) Educated  Patient    Methods  Explanation;Demonstration;Tactile cues;Verbal cues    Comprehension  Returned demonstration;Need further instruction       PT Short Term Goals - 07/19/18 1123      PT SHORT TERM GOAL #1   Title  Patient will reduce timed up and go to <11 seconds to reduce fall risk and demonstrate improved transfer/gait ability.    Baseline  22.07 sec; 03/24/18: 14.2sec; 04/25/18: 14.0 sec with RW, 06/01/18: 12.7s, 07/19/18: 11.12 sec    Time  2    Period  Weeks    Status  Achieved      PT SHORT TERM GOAL #2   Title  Patient will be independent with ascend/descend 2 steps using single UE in step over step pattern without LOB.    Baseline  03/24/18: CGA, single UE support, step-to pattern without LOB; 04/25/18: CGA, single UE support, step-over-step pattern without LOB; 06/01/18: CGA, single UE support; 07/19/18: supervision with 1 rail, but does step to pattern descending;     Time  2    Period  Weeks    Status  Partially Met    Target Date  08/02/18        PT Long Term Goals - 08/16/18 1108      PT LONG TERM GOAL #1   Title  Patient will be independent in home exercise program to improve strength/mobility for better functional independence with ADLs.    Baseline  04/25/18: unsure but does not remember doing HEP lately; 06/01/18: Pt performs intermittently    Time  8    Period  Weeks    Status  Achieved      PT LONG TERM GOAL #2   Title  Patient (> 99 years old) will complete five times sit to stand test in < 15 seconds indicating an increased  LE strength and improved balance    Baseline  34.21 sec; 03/24/2018: 22.9 sec; 04/25/18: 18.5 sec with BUE support; 06/01/18: 15.5s with BUE support, still unable to perform STS without UE support; 07/19/18: 20 sec with 1 HHA   08/16/18=16.64 with BUE support    Time  8    Period  Weeks  Status  Not Met    Target Date  10/11/18      PT LONG TERM GOAL #3   Title  Patient will increase six minute walk test distance to >1000 for progression to community ambulator and improve gait ability    Baseline  550 feet; 03/24/18: 795 ft.; 04/25/18: 975 ft with RW; 06/01/18: 5465' with RW; 07/19/18: 1050 without AD    Time  8    Period  Weeks    Status  Achieved      PT LONG TERM GOAL #4   Title  Patient will increase 10 meter walk test to >1.47ms as to improve gait speed for better community ambulation and to reduce fall risk    Baseline  .49 m/sec; 03/24/18: 1.01 m/sec; 06/01/18: self-selected: 9.7s = 1.03 m/s, fastest: 8.0s = 1.25 m/s    Period  Weeks    Status  Achieved      PT LONG TERM GOAL #5   Title  Pt will improve BERG by at least 3 points in order to demonstrate clinically significant improvement in balance.      Baseline  06/01/18: 44/56:    08/16/18=37/56    Time  8    Period  Weeks    Status  Partially Met    Target Date  10/11/18            Plan - 08/16/18 1126    Clinical Impression Statement  Patient's condition has the potential to improve in response to therapy. Maximum improvement is yet to be obtained. The anticipated improvement is attainable and reasonable in a generally predictable time.  Patient reports that she is able to stand up better.  Pt requires direction and verbal cues for correct performance of supine and standing  strengthening exercises. .Patient has fatigue with endurance and difficulty with transfers and standing tasks..  Pt encouraged continuing HEP   Patient will benefit from continued skilled PT to improve mobility and safety    Rehab Potential  Fair    PT  Frequency  2x / week    PT Duration  8 weeks    PT Treatment/Interventions  Patient/family education;Neuromuscular re-education;Balance training;Stair training;Therapeutic activities;Therapeutic exercise;Gait training;Aquatic Therapy    PT Next Visit Plan  balance and strengthening    PT Home Exercise Plan  unable to provide due to short Evaluation session scheduled    Consulted and Agree with Plan of Care  Patient       Patient will benefit from skilled therapeutic intervention in order to improve the following deficits and impairments:  Abnormal gait, Decreased balance, Decreased endurance, Decreased mobility, Impaired flexibility, Decreased strength, Decreased knowledge of use of DME, Decreased activity tolerance, Difficulty walking  Visit Diagnosis: Muscle weakness (generalized) - Plan: PT plan of care cert/re-cert  Other lack of coordination - Plan: PT plan of care cert/re-cert  Difficulty in walking, not elsewhere classified - Plan: PT plan of care cert/re-cert  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 Liver lesion 04/22/2018  . Goals of care, counseling/discussion 04/22/2018  . Cognitive deficit, post-stroke 12/31/2017  . Ataxia, post-stroke 12/31/2017  . Dysarthria, post-stroke   . Gait disturbance, post-stroke   . H/O cerebral parenchymal hemorrhage 11/04/2017  . Essential hypertension 11/04/2017  . Hyperlipidemia 11/04/2017  . Diabetes ([place] 11/04/2017  . CAD (  coronary artery disease) 11/04/2017  . 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 Hypokalemia 11/04/2017  . Right-sided nontraumatic intracerebral hemorrhage of cerebellum ([place])   . History of cervical cancer   . History of TIA (transient ischemic attack)   . Acute systolic congestive heart failure ([place])   .  Reactive hypertension   . Hypernatremia   . Leukocytosis   . Acute blood loss anemia   . Elevated serum creatinine   . Acute respiratory failure with hypoxia ([place])   . IVH (intraventricular hemorrhage) ([place]) 10/29/2017  . Hypoxia 10/28/2017  . Pancreatic lesion 05/24/2017  . 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 Nodule of upper lobe of left lung 06/04/2015  . 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 Cerebral thrombosis with cerebral infarction 04/03/2015  . Cancer of right colon ([place]) 03/28/2015  . CVA (cerebral infarction) 12/21/2014  . Polycythemia vera ([place]) 06/22/2006    [name], PT DPT 08/16/2018, 11:34 AM  [place] MAIN [facility] SERVICES [address] [place], [place], [address] Phone: [phone]   Fax:  [phone]  Name: JANNETTA MASSEY MRN: [id number] Date of Birth: January 06, 1932

## 2018-08-18 ENCOUNTER — Encounter: Payer: Self-pay | Admitting: Occupational Therapy

## 2018-08-18 ENCOUNTER — Encounter: Payer: Self-pay | Admitting: Physical Therapy

## 2018-08-18 ENCOUNTER — Ambulatory Visit: Payer: Medicare Other

## 2018-08-18 ENCOUNTER — Ambulatory Visit: Payer: Medicare Other | Admitting: Occupational Therapy

## 2018-08-18 DIAGNOSIS — M6281 Muscle weakness (generalized): Secondary | ICD-10-CM | POA: Diagnosis not present

## 2018-08-18 DIAGNOSIS — R278 Other lack of coordination: Secondary | ICD-10-CM

## 2018-08-18 DIAGNOSIS — R262 Difficulty in walking, not elsewhere classified: Secondary | ICD-10-CM

## 2018-08-18 NOTE — Therapy (Signed)
[place] MAIN [facility] SERVICES [address] [place], [place], [address] Phone: [phone]   Fax:  [phone]  Physical Therapy Treatment  Patient Details  Name: [name] MRN: [id number] Date of Birth: Sep 16, 1931 Referring Provider (PT): Charlett Blake   Encounter Date: 08/18/2018  PT End of Session - 08/18/18 1056    Visit Number  37    Number of Visits  31    Date for PT Re-Evaluation  10/11/18    Authorization Type  8/10 start of care 01/31/18, last goals 07/19/18     PT Start Time  1058    PT Stop Time  1141    PT Time Calculation (min)  43 min    Equipment Utilized During Treatment  Gait belt    Activity Tolerance  Patient tolerated treatment well;No increased pain    Behavior During Therapy  WFL for tasks assessed/performed       Past Medical History:  Diagnosis Date  . Adenocarcinoma in situ of cervix   . Arthritis    hands  . Breast cancer ([place])   . Colon cancer ([place])   . Diabetes mellitus without complication ([place])   . Endometrial carcinoma (HCC)    s/p total abdominal hysterectomy  . H/O compression fracture of spine 2014   thoracic spine  . H/O polycythemia vera   . H/O TIA (transient ischemic attack) and stroke 09/2014, 03/2015   No deficits  . Hypertension   . Hyperuricemia   . Microalbuminuria   . Polycythemia vera ([place])   . Recurrent falls   . Skin cancer    face, legs  . Stroke [facility]) 2008   no deficits  . Trochanteric bursitis   . Varicose veins    treated    Past Surgical History:  Procedure Laterality Date  . ABDOMINAL HYSTERECTOMY    . BOWEL RESECTION N/A 03/28/2015   Procedure: SMALL BOWEL RESECTION;  Surgeon: Leonie Green, MD;  Location: [facility] ORS;  Service: General;  Laterality: N/A;  . CATARACT EXTRACTION W/ INTRAOCULAR LENS IMPLANT Right   . CATARACT EXTRACTION W/PHACO Left 10/07/2015   Procedure: CATARACT EXTRACTION PHACO AND INTRAOCULAR LENS PLACEMENT (IOC);  Surgeon: Ronnell Freshwater, MD;  Location: [place];  Service: Ophthalmology;  Laterality: Left;  DIABETIC - oral meds VISION BLUE  . CORONARY ANGIOGRAPHY N/A 10/29/2017   Procedure: CORONARY ANGIOGRAPHY;  Surgeon: Dionisio David, MD;  Location: [place] CV LAB;  Service: Cardiovascular;  Laterality: N/A;  . EXPLORATORY LAPAROTOMY     for fibroids  . LEFT HEART CATH Right 10/29/2017   Procedure: Left Heart Cath;  Surgeon: Dionisio David, MD;  Location: [place] CV LAB;  Service: Cardiovascular;  Laterality: Right;  . TONSILLECTOMY      There were no vitals filed for this visit.   TREATMENT:   Neuromuscular Re-education  Navigating through agility ladder forward one foot ea hole x4 lengths, CGA-minAx1 Side stepping 2 laps ea direction, cues for step length In and out of agility ladder with L foot leading and then R foot leading on the way back x2 laps ea, CGa, mod verbal cues for sequencing of movements Stepping over orange hurdle laterally x10, no UE support, minAx1 Stepping up onto 4" step without UE support, mod verbal cues, CGA-minAx1 x10 ea Airex balance NBOS eyes open/closed 3x30s in each condition; CGA Airex balance step forward alternating foot placement 3x20sec; CGA     Pt educated throughout session about proper  posture and technique with exercises. Improved exercise technique, movement at target joints, use of target muscles after mod verbal, visual, tactile cues.    PT Education - 08/18/18 1058    Education Details  therapeutic exericse/technique    Person(s) Educated  Patient    Methods  Explanation;Demonstration;Tactile cues;Verbal cues    Comprehension  Verbalized understanding       PT Short Term Goals - 07/19/18 1123      PT SHORT TERM GOAL #1   Title  Patient will reduce timed up and go to <11 seconds to reduce fall risk and demonstrate improved transfer/gait ability.    Baseline  22.07 sec; 03/24/18: 14.2sec; 04/25/18: 14.0 sec with RW, 06/01/18:  12.7s, 07/19/18: 11.12 sec    Time  2    Period  Weeks    Status  Achieved      PT SHORT TERM GOAL #2   Title  Patient will be independent with ascend/descend 2 steps using single UE in step over step pattern without LOB.    Baseline  03/24/18: CGA, single UE support, step-to pattern without LOB; 04/25/18: CGA, single UE support, step-over-step pattern without LOB; 06/01/18: CGA, single UE support; 07/19/18: supervision with 1 rail, but does step to pattern descending;     Time  2    Period  Weeks    Status  Partially Met    Target Date  08/02/18        PT Long Term Goals - 08/16/18 1108      PT LONG TERM GOAL #1   Title  Patient will be independent in home exercise program to improve strength/mobility for better functional independence with ADLs.    Baseline  04/25/18: unsure but does not remember doing HEP lately; 06/01/18: Pt performs intermittently    Time  8    Period  Weeks    Status  Achieved      PT LONG TERM GOAL #2   Title  Patient (> 55 years old) will complete five times sit to stand test in < 15 seconds indicating an increased LE strength and improved balance    Baseline  34.21 sec; 03/24/2018: 22.9 sec; 04/25/18: 18.5 sec with BUE support; 06/01/18: 15.5s with BUE support, still unable to perform STS without UE support; 07/19/18: 20 sec with 1 HHA   08/16/18=16.64 with BUE support    Time  8    Period  Weeks    Status  Not Met    Target Date  10/11/18      PT LONG TERM GOAL #3   Title  Patient will increase six minute walk test distance to >1000 for progression to community ambulator and improve gait ability    Baseline  550 feet; 03/24/18: 795 ft.; 04/25/18: 975 ft with RW; 06/01/18: 1040' with RW; 07/19/18: 1050 without AD    Time  8    Period  Weeks    Status  Achieved      PT LONG TERM GOAL #4   Title  Patient will increase 10 meter walk test to >1.63ms as to improve gait speed for better community ambulation and to reduce fall risk    Baseline  .49 m/sec; 03/24/18:  1.01 m/sec; 06/01/18: self-selected: 9.7s = 1.03 m/s, fastest: 8.0s = 1.25 m/s    Period  Weeks    Status  Achieved      PT LONG TERM GOAL #5   Title  Pt will improve BERG by at least 3 points in order to  demonstrate clinically significant improvement in balance.      Baseline  06/01/18: 44/56:    08/16/18=37/56    Time  8    Period  Weeks    Status  Partially Met    Target Date  10/11/18            Plan - 08/18/18 1252    Clinical Impression Statement  Patient did very well with therapy this session. Able to progress program well without complaint, moderate fatigue at end of session. Pt needed CGA-minAx1 for higher level balance activities this session for safety, as well as moderate verbal cues for exercise technique and form. The patient would benefit from further skilled PT intervention to continue to progress towards goals.     Rehab Potential  Fair    PT Frequency  2x / week    PT Duration  8 weeks    PT Treatment/Interventions  Patient/family education;Neuromuscular re-education;Balance training;Stair training;Therapeutic activities;Therapeutic exercise;Gait training;Aquatic Therapy    PT Next Visit Plan  balance and strengthening    PT Home Exercise Plan  unable to provide due to short Evaluation session scheduled    Consulted and Agree with Plan of Care  Patient       Patient will benefit from skilled therapeutic intervention in order to improve the following deficits and impairments:  Abnormal gait, Decreased balance, Decreased endurance, Decreased mobility, Impaired flexibility, Decreased strength, Decreased knowledge of use of DME, Decreased activity tolerance, Difficulty walking  Visit Diagnosis: Muscle weakness (generalized)  Other lack of coordination  Difficulty in walking, not elsewhere classified     Problem List Patient Active Problem List   Diagnosis Date Noted  . Liver lesion 04/22/2018  . Goals of care, counseling/discussion 04/22/2018  . Cognitive  deficit, post-stroke 12/31/2017  . Ataxia, post-stroke 12/31/2017  . Dysarthria, post-stroke   . Gait disturbance, post-stroke   . H/O cerebral parenchymal hemorrhage 11/04/2017  . Essential hypertension 11/04/2017  . Hyperlipidemia 11/04/2017  . Diabetes ([place]) 11/04/2017  . CAD (coronary artery disease) 11/04/2017  . 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 Hypokalemia 11/04/2017  . Right-sided nontraumatic intracerebral hemorrhage of cerebellum ([place])   . History of cervical cancer   . History of TIA (transient ischemic attack)   . Acute systolic congestive heart failure ([place])   . Reactive hypertension   . Hypernatremia   . Leukocytosis   . Acute blood loss anemia   . Elevated serum creatinine   . Acute respiratory failure with hypoxia ([place])   . IVH (intraventricular hemorrhage) ([place]) 10/29/2017  . Hypoxia 10/28/2017  . Pancreatic lesion 05/24/2017  . 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 Nodule of upper lobe of left lung 06/04/2015  . 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 Cerebral thrombosis with cerebral infarction 04/03/2015  . Cancer of right colon ([place]) 03/28/2015  . CVA (cerebral infarction) 12/21/2014  . Polycythemia vera ([place]) 06/22/2006   Lieutenant Diego PT, DPT 12:56 PM,08/18/18 [place] MAIN [facility] SERVICES [address] [place], [place], [address] Phone: [phone]   Fax:  [phone]  Name: [name] MRN: [id number] Date of Birth: [birth year]

## 2018-08-18 NOTE — Therapy (Signed)
[place] MAIN [facility] SERVICES [address] [place], [place], [address] Phone: [phone]   Fax:  [phone]  Occupational Therapy Treatment  Patient Details  Name: [name] MRN: [id number] Date of Birth: November 27, 1931 No data recorded  Encounter Date: 08/18/2018  OT End of Session - 08/18/18 1201    Visit Number  43    Number of Visits  72    Date for OT Re-Evaluation  09/15/18    OT Start Time  1147    OT Stop Time  1230    OT Time Calculation (min)  43 min    Activity Tolerance  Patient tolerated treatment well    Behavior During Therapy  [facility] for tasks assessed/performed       Past Medical History:  Diagnosis Date  . Adenocarcinoma in situ of cervix   . Arthritis    hands  . Breast cancer ([place])   . Colon cancer ([place])   . Diabetes mellitus without complication ([place])   . Endometrial carcinoma (HCC)    s/p total abdominal hysterectomy  . H/O compression fracture of spine 2014   thoracic spine  . H/O polycythemia vera   . H/O TIA (transient ischemic attack) and stroke 09/2014, 03/2015   No deficits  . Hypertension   . Hyperuricemia   . Microalbuminuria   . Polycythemia vera ([place])   . Recurrent falls   . Skin cancer    face, legs  . Stroke [facility]) 2008   no deficits  . Trochanteric bursitis   . Varicose veins    treated    Past Surgical History:  Procedure Laterality Date  . ABDOMINAL HYSTERECTOMY    . BOWEL RESECTION N/A 03/28/2015   Procedure: SMALL BOWEL RESECTION;  Surgeon: Leonie Green, MD;  Location: [facility] ORS;  Service: General;  Laterality: N/A;  . CATARACT EXTRACTION W/ INTRAOCULAR LENS IMPLANT Right   . CATARACT EXTRACTION W/PHACO Left 10/07/2015   Procedure: CATARACT EXTRACTION PHACO AND INTRAOCULAR LENS PLACEMENT (IOC);  Surgeon: Ronnell Freshwater, MD;  Location: [place];  Service: Ophthalmology;  Laterality: Left;  DIABETIC - oral meds VISION BLUE  . CORONARY ANGIOGRAPHY N/A  10/29/2017   Procedure: CORONARY ANGIOGRAPHY;  Surgeon: Dionisio David, MD;  Location: [place] CV LAB;  Service: Cardiovascular;  Laterality: N/A;  . EXPLORATORY LAPAROTOMY     for fibroids  . LEFT HEART CATH Right 10/29/2017   Procedure: Left Heart Cath;  Surgeon: Dionisio David, MD;  Location: [place] CV LAB;  Service: Cardiovascular;  Laterality: Right;  . TONSILLECTOMY      There were no vitals filed for this visit.  Subjective Assessment - 08/18/18 1200    Subjective   Pt. reports that her caregivers stay around the clock.    Pertinent History  85 year old female with adenocarcinoma of the cervix as well as endometrial carcinoma and polycythemia vera as well as diabetes who had hypertensive right cerebellar hemorrhage onset 10/28/2017.  She was treated initially at [facility] and then transferred to [facility].  Neurosurgical evaluation per Dr. Ronnald Ramp concluded no surgery was needed.  The patient completed inpatient rehabilitation at [facility] (including SLP) 11/04/2017-11/18/2107 and was discharged at a 24-hour supervision level.  The patient has hired caregiver to provide 24-hour supervision. Patient has received home health rehab services, including SLP.     Patient Stated Goals  Patient would like to be independent in all tasks and be able to live alone again.  Currently in Pain?  No/denies       OT TREATMENT    Selfcare:  Pt. worked on Youth worker of companies. Pt. worked on Media planner when filling out simulated personal check writing. Pt. required increased time to complete, and wrote in cursive form with greater than 50% legibility today.                       OT Education - 08/18/18 1201    Education Details  handwriting    Person(s) Educated  Patient    Methods  Explanation;Demonstration;Verbal cues    Comprehension  Verbalized understanding;Returned demonstration;Verbal cues required           OT Long Term Goals - 08/11/18 1025      OT LONG TERM GOAL #1   Title  Patient will complete bathing with modified independence    Baseline  1/14/20120: Pt. continues to have difficulty with washing the bottom of her feet, however is able to complete it.    Time  12    Period  Weeks    Status  Partially Met    Target Date  09/15/18      OT LONG TERM GOAL #2   Title  Patient will complete dressing skills with modified independence    Baseline  07/05/2018: Pt. conitnues to require assist with hooking her bra. Pt. requires care giver assist secondary to requiring increased time to complete.    Time  12    Period  Weeks    Status  On-going    Target Date  09/15/18      OT LONG TERM GOAL #3   Title  Patient will complete light meal prep with min assist    Baseline  07/05/2018: Pt. is able to fix herself something light to eat, However requires assist formeal preparation.    Time  12    Period  Weeks    Status  On-going    Target Date  09/15/18      OT LONG TERM GOAL #4   Title  Patient will increase R grip strength by 5# to open jars and containers with modified independence    Baseline  07/05/2018: Grip is improving, however requires assist to open containers.    Time  12    Period  Weeks    Status  On-going    Target Date  09/15/18      OT LONG TERM GOAL #5   Title  Patient will improve strength by 1 mm grade RUE to assist with obtaining items from the closet.     Baseline  07/05/2018: Pt. has made progress, and has difficulty reaching for items in the closet.    Time  8    Period  Weeks    Status  On-going    Target Date  09/15/18      OT LONG TERM GOAL #6   Title  Pt will increase handwriting legibility and speed to 50% while writing a 3 sentence paragraph in order to write thank you notes and birthday cards for friends and family    Baseline  07/05/2018: 50% writing lebility for one sentence with increased time.    Time  5    Period  Weeks    Status  On-going     Target Date  09/15/18            Plan - 08/18/18 1202    Clinical Impression Statement  Pt. continues  to make progress with handwriting skills. Pt. continues continues to present with improvied writing legibility in printed form, however continues to present with limited legibility in cursive form. Pt. continues to work on improving  wriitng legibility in order to be able to write notecard correspondence, and checks legibly.    Occupational Profile and client history currently impacting functional performance  family more than 30 mins away, has 24 hour caregiver assist, decreased awareness, memory and safety    Occupational performance deficits (Please refer to evaluation for details):  ADL's;IADL's;Social Participation    Rehab Potential  Good    Current Impairments/barriers affecting progress:  memory, requires 24 hour caregivers,     OT Frequency  2x / week    OT Duration  12 weeks    OT Treatment/Interventions  Self-care/ADL training;Cryotherapy;Therapeutic exercise;DME and/or AE instruction;Functional Mobility Training;Cognitive remediation/compensation;Balance training;Neuromuscular education;Manual Therapy;Moist Heat;Therapeutic activities;Patient/family education    Clinical Decision Making  Several treatment options, min-mod task modification necessary    Consulted and Agree with Plan of Care  Patient    Family Member Consulted  Caregiver       Patient will benefit from skilled therapeutic intervention in order to improve the following deficits and impairments:  Decreased balance, Decreased mobility, Difficulty walking, Decreased cognition, Decreased activity tolerance, Decreased coordination, Decreased strength, Impaired UE functional use  Visit Diagnosis: Other lack of coordination    Problem List Patient Active Problem List   Diagnosis Date Noted  . Liver lesion 04/22/2018  . Goals of care, counseling/discussion 04/22/2018  . Cognitive deficit, post-stroke  12/31/2017  . Ataxia, post-stroke 12/31/2017  . Dysarthria, post-stroke   . Gait disturbance, post-stroke   . H/O cerebral parenchymal hemorrhage 11/04/2017  . Essential hypertension 11/04/2017  . Hyperlipidemia 11/04/2017  . Diabetes ([place]) 11/04/2017  . CAD (coronary artery disease) 11/04/2017  . 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 Hypokalemia 11/04/2017  . Right-sided nontraumatic intracerebral hemorrhage of cerebellum ([place])   . History of cervical cancer   . History of TIA (transient ischemic attack)   . Acute systolic congestive heart failure ([place])   . Reactive hypertension   . Hypernatremia   . Leukocytosis   . Acute blood loss anemia   . Elevated serum creatinine   . Acute respiratory failure with hypoxia ([place])   . IVH (intraventricular hemorrhage) ([place]) 10/29/2017  . Hypoxia 10/28/2017  . Pancreatic lesion 05/24/2017  . 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 Nodule of upper lobe of left lung 06/04/2015  . 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 Cerebral thrombosis with cerebral infarction 04/03/2015  . Cancer of right colon ([place]) 03/28/2015  . CVA (cerebral infarction) 12/21/2014  . Polycythemia vera ([place]) 06/22/2006    Harrel Carina, MS, OTR/L 08/18/2018, 12:21 PM  [place] MAIN [facility] SERVICES [address] [place], [place], [address] Phone: [phone]   Fax:  [phone]  Name: [name] MRN: [id number] Date of Birth: [birth year]

## 2018-08-23 ENCOUNTER — Ambulatory Visit: Payer: Medicare Other | Admitting: Occupational Therapy

## 2018-08-23 ENCOUNTER — Encounter: Payer: Self-pay | Admitting: Occupational Therapy

## 2018-08-23 ENCOUNTER — Ambulatory Visit: Payer: Medicare Other | Attending: Physical Medicine & Rehabilitation

## 2018-08-23 ENCOUNTER — Encounter: Payer: Self-pay | Admitting: Physical Therapy

## 2018-08-23 DIAGNOSIS — M6281 Muscle weakness (generalized): Secondary | ICD-10-CM | POA: Insufficient documentation

## 2018-08-23 DIAGNOSIS — R278 Other lack of coordination: Secondary | ICD-10-CM

## 2018-08-23 DIAGNOSIS — Z8679 Personal history of other diseases of the circulatory system: Secondary | ICD-10-CM | POA: Insufficient documentation

## 2018-08-23 DIAGNOSIS — R471 Dysarthria and anarthria: Secondary | ICD-10-CM | POA: Diagnosis present

## 2018-08-23 DIAGNOSIS — R262 Difficulty in walking, not elsewhere classified: Secondary | ICD-10-CM | POA: Insufficient documentation

## 2018-08-23 NOTE — Therapy (Addendum)
[place] MAIN [facility] SERVICES [address] [place], [place], [address] Phone: [phone]   Fax:  [phone]  Occupational Therapy Treatment  Patient Details  Name: [name] MRN: [id number] Date of Birth: Nov 20, 1931 No data recorded  Encounter Date: 08/23/2018  OT End of Session - 08/23/18 1153    Visit Number  74    Number of Visits  4    Date for OT Re-Evaluation  09/15/18    Authorization Type  Medicare    Authorization Time Period  Next progress report period starting 08/09/2018    OT Start Time  1145    OT Stop Time  1230    OT Time Calculation (min)  45 min    Activity Tolerance  Patient tolerated treatment well    Behavior During Therapy  [facility] for tasks assessed/performed       Past Medical History:  Diagnosis Date  . Adenocarcinoma in situ of cervix   . Arthritis    hands  . Breast cancer ([place])   . Colon cancer ([place])   . Diabetes mellitus without complication ([place])   . Endometrial carcinoma (HCC)    s/p total abdominal hysterectomy  . H/O compression fracture of spine 2014   thoracic spine  . H/O polycythemia vera   . H/O TIA (transient ischemic attack) and stroke 09/2014, 03/2015   No deficits  . Hypertension   . Hyperuricemia   . Microalbuminuria   . Polycythemia vera ([place])   . Recurrent falls   . Skin cancer    face, legs  . Stroke [facility]) 2008   no deficits  . Trochanteric bursitis   . Varicose veins    treated    Past Surgical History:  Procedure Laterality Date  . ABDOMINAL HYSTERECTOMY    . BOWEL RESECTION N/A 03/28/2015   Procedure: SMALL BOWEL RESECTION;  Surgeon: Leonie Green, MD;  Location: [facility] ORS;  Service: General;  Laterality: N/A;  . CATARACT EXTRACTION W/ INTRAOCULAR LENS IMPLANT Right   . CATARACT EXTRACTION W/PHACO Left 10/07/2015   Procedure: CATARACT EXTRACTION PHACO AND INTRAOCULAR LENS PLACEMENT (IOC);  Surgeon: Ronnell Freshwater, MD;  Location: [place];   Service: Ophthalmology;  Laterality: Left;  DIABETIC - oral meds VISION BLUE  . CORONARY ANGIOGRAPHY N/A 10/29/2017   Procedure: CORONARY ANGIOGRAPHY;  Surgeon: Dionisio David, MD;  Location: [place] CV LAB;  Service: Cardiovascular;  Laterality: N/A;  . EXPLORATORY LAPAROTOMY     for fibroids  . LEFT HEART CATH Right 10/29/2017   Procedure: Left Heart Cath;  Surgeon: Dionisio David, MD;  Location: [place] CV LAB;  Service: Cardiovascular;  Laterality: Right;  . TONSILLECTOMY      There were no vitals filed for this visit.  Subjective Assessment - 08/23/18 1110    Subjective   Pt. reports that her nose has been running lately.    Pertinent History  85 year old female with adenocarcinoma of the cervix as well as endometrial carcinoma and polycythemia vera as well as diabetes who had hypertensive right cerebellar hemorrhage onset 10/28/2017.  She was treated initially at [facility] and then transferred to [facility].  Neurosurgical evaluation per Dr. Ronnald Ramp concluded no surgery was needed.  The patient completed inpatient rehabilitation at [facility] (including SLP) 11/04/2017-11/18/2107 and was discharged at a 24-hour supervision level.  The patient has hired caregiver to provide 24-hour supervision. Patient has received home health rehab services, including SLP.  Patient Stated Goals  Patient would like to be independent in all tasks and be able to live alone again.    Currently in Pain?  No/denies      OT TREATMENT    Selfcare:  Pt. worked on Pension scheme manager of months. Pt. had difficulty writing, and connecting e, m, b, e in cursive writing on standard lined paper. Pt. Formulated words with her right UE as one unit. Pt. worked on prewriting exercises flexing, and extending her digits, and wlking her fingers up, and down a standard pen. A long pencil, and a long stick. to improve distal mobility with the pen during writing. Pt. worked on Artist postal envelopes. Pt. Education was provide about pen grips, and finger placement on the grip.                        OT Education - 08/23/18 1153    Education Details  Handwriting    Person(s) Educated  Patient    Methods  Explanation;Demonstration;Verbal cues    Comprehension  Verbalized understanding;Returned demonstration;Verbal cues required          OT Long Term Goals - 08/11/18 1025      OT LONG TERM GOAL #1   Title  Patient will complete bathing with modified independence    Baseline  1/14/20120: Pt. continues to have difficulty with washing the bottom of her feet, however is able to complete it.    Time  12    Period  Weeks    Status  Partially Met    Target Date  09/15/18      OT LONG TERM GOAL #2   Title  Patient will complete dressing skills with modified independence    Baseline  07/05/2018: Pt. conitnues to require assist with hooking her bra. Pt. requires care giver assist secondary to requiring increased time to complete.    Time  12    Period  Weeks    Status  On-going    Target Date  09/15/18      OT LONG TERM GOAL #3   Title  Patient will complete light meal prep with min assist    Baseline  07/05/2018: Pt. is able to fix herself something light to eat, However requires assist formeal preparation.    Time  12    Period  Weeks    Status  On-going    Target Date  09/15/18      OT LONG TERM GOAL #4   Title  Patient will increase R grip strength by 5# to open jars and containers with modified independence    Baseline  07/05/2018: Grip is improving, however requires assist to open containers.    Time  12    Period  Weeks    Status  On-going    Target Date  09/15/18      OT LONG TERM GOAL #5   Title  Patient will improve strength by 1 mm grade RUE to assist with obtaining items from the closet.     Baseline  07/05/2018: Pt. has made progress, and has difficulty reaching for items in the closet.    Time  8    Period   Weeks    Status  On-going    Target Date  09/15/18      OT LONG TERM GOAL #6   Title  Pt will increase handwriting legibility and speed to 50% while writing a 3 sentence paragraph in  order to write thank you notes and birthday cards for friends and family    Baseline  07/05/2018: 50% writing lebility for one sentence with increased time.    Time  5    Period  Weeks    Status  On-going    Target Date  09/15/18            Plan - 08/23/18 1258    Clinical Impression Statement  Pt. reports that she would like to continue working on writing  skills. Pt. reports concern about her taxes, and reports that her tax advisor is having her do alot more things for her taxes which has been taking up alot of her thought lately. Pt. reports that she may have to cancel, and reschedule a few appointments until her taxes are taken care of. Pt. continues to benefit from skilled OT services in order to be able to improve writing legibility for addressing postal envelopes, a writing notecard correspondence, reaching into cabilnetry/closets, as well as improving ADL, and IADL functioning.    Occupational Profile and client history currently impacting functional performance  family more than 30 mins away, has 24 hour caregiver assist, decreased awareness, memory and safety    Occupational performance deficits (Please refer to evaluation for details):  ADL's;IADL's;Social Participation    Body Structure / Function / Physical Skills  ADL;UE functional use;FMC;ROM;Strength;IADL;Pain    Rehab Potential  Good    Clinical Decision Making  Several treatment options, min-mod task modification necessary    OT Frequency  2x / week    OT Duration  12 weeks    OT Treatment/Interventions  Self-care/ADL training;Cryotherapy;Therapeutic exercise;DME and/or AE instruction;Functional Mobility Training;Cognitive remediation/compensation;Balance training;Neuromuscular education;Manual Therapy;Moist Heat;Therapeutic  activities;Patient/family education    Consulted and Agree with Plan of Care  Patient    Family Member Consulted  Caregiver       Patient will benefit from skilled therapeutic intervention in order to improve the following deficits and impairments:  Body Structure / Function / Physical Skills  Visit Diagnosis: Muscle weakness (generalized)  Other lack of coordination    Problem List Patient Active Problem List   Diagnosis Date Noted  . Liver lesion 04/22/2018  . Goals of care, counseling/discussion 04/22/2018  . Cognitive deficit, post-stroke 12/31/2017  . Ataxia, post-stroke 12/31/2017  . Dysarthria, post-stroke   . Gait disturbance, post-stroke   . H/O cerebral parenchymal hemorrhage 11/04/2017  . Essential hypertension 11/04/2017  . Hyperlipidemia 11/04/2017  . Diabetes ([place]) 11/04/2017  . CAD (coronary artery disease) 11/04/2017  . 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 Hypokalemia 11/04/2017  . Right-sided nontraumatic intracerebral hemorrhage of cerebellum ([place])   . History of cervical cancer   . History of TIA (transient ischemic attack)   . Acute systolic congestive heart failure ([place])   . Reactive hypertension   . Hypernatremia   . Leukocytosis   . Acute blood loss anemia   . Elevated serum creatinine   . Acute respiratory failure with hypoxia ([place])   . IVH (intraventricular hemorrhage) ([place]) 10/29/2017  . Hypoxia 10/28/2017  . Pancreatic lesion 05/24/2017  . 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 Nodule of upper lobe of left lung 06/04/2015  . 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 Cerebral thrombosis with cerebral infarction 04/03/2015  . Cancer of right colon ([place]) 03/28/2015  . CVA (cerebral infarction) 12/21/2014  . Polycythemia vera ([place]) 06/22/2006    Harrel Carina, MS, OTR/L 08/23/2018, 3:49 PM  [place]  [place], [place],  [address] Phone: [phone]   Fax:  [phone]  Name: [name] MRN: [id number] Date of Birth: 1931/06/29

## 2018-08-23 NOTE — Therapy (Signed)
[place] MAIN [facility] SERVICES [address] [place], [place], [address] Phone: [phone]   Fax:  [phone]  Physical Therapy Treatment  Patient Details  Name: [name] MRN: [id number] Date of Birth: Jul 07, 1931 Referring Provider (PT): Charlett Blake   Encounter Date: 08/23/2018  PT End of Session - 08/23/18 1112    Visit Number  38    Number of Visits  71    Date for PT Re-Evaluation  10/11/18    Authorization Type  9/10 start of care 01/31/18, last goals 07/19/18     PT Start Time  1017    PT Stop Time  1057    PT Time Calculation (min)  40 min    Equipment Utilized During Treatment  Gait belt    Activity Tolerance  Patient tolerated treatment well;Patient limited by fatigue    Behavior During Therapy  WFL for tasks assessed/performed       Past Medical History:  Diagnosis Date  . Adenocarcinoma in situ of cervix   . Arthritis    hands  . Breast cancer ([place])   . Colon cancer ([place])   . Diabetes mellitus without complication ([place])   . Endometrial carcinoma (HCC)    s/p total abdominal hysterectomy  . H/O compression fracture of spine 2014   thoracic spine  . H/O polycythemia vera   . H/O TIA (transient ischemic attack) and stroke 09/2014, 03/2015   No deficits  . Hypertension   . Hyperuricemia   . Microalbuminuria   . Polycythemia vera ([place])   . Recurrent falls   . Skin cancer    face, legs  . Stroke [facility]) 2008   no deficits  . Trochanteric bursitis   . Varicose veins    treated    Past Surgical History:  Procedure Laterality Date  . ABDOMINAL HYSTERECTOMY    . BOWEL RESECTION N/A 03/28/2015   Procedure: SMALL BOWEL RESECTION;  Surgeon: Leonie Green, MD;  Location: [facility] ORS;  Service: General;  Laterality: N/A;  . CATARACT EXTRACTION W/ INTRAOCULAR LENS IMPLANT Right   . CATARACT EXTRACTION W/PHACO Left 10/07/2015   Procedure: CATARACT EXTRACTION PHACO AND INTRAOCULAR LENS PLACEMENT (IOC);  Surgeon:  Ronnell Freshwater, MD;  Location: [place];  Service: Ophthalmology;  Laterality: Left;  DIABETIC - oral meds VISION BLUE  . CORONARY ANGIOGRAPHY N/A 10/29/2017   Procedure: CORONARY ANGIOGRAPHY;  Surgeon: Dionisio David, MD;  Location: [place] CV LAB;  Service: Cardiovascular;  Laterality: N/A;  . EXPLORATORY LAPAROTOMY     for fibroids  . LEFT HEART CATH Right 10/29/2017   Procedure: Left Heart Cath;  Surgeon: Dionisio David, MD;  Location: [place] CV LAB;  Service: Cardiovascular;  Laterality: Right;  . TONSILLECTOMY      There were no vitals filed for this visit.  Subjective Assessment - 08/23/18 1110    Subjective  Patient has no complaints at start of session, stated she is doing well    Pertinent History    85 year old female with adenocarcinoma of the cervix as well as endometrial carcinoma and polycythemia vera as well as diabetes who had hypertensive right cerebellar hemorrhage onset 10/28/2017.  She was treated initially at [facility] and then transferred to [facility].  Neurosurgical evaluation per Dr. Ronnald Ramp concluded no surgery was needed.  The patient completed inpatient rehabilitation at [facility] (including SLP) 11/04/2017-11/18/2107 and was discharged at a 24-hour supervision level.  The patient has hired caregiver  to provide 24-hour supervision. Patient has received home health rehab services, including SLP.    Limitations  Standing;Walking    How long can you walk comfortably?  a few minutes    Patient Stated Goals  Patient wants to walk better and not need the RW.     Currently in Pain?  No/denies       TREATMENT:?  Neuromuscular Re-education  Navigating through agility ladder forward?one foot ea hole x4 lengths, CGA-minAx1  Side stepping 2 laps ea direction, cues for step length  In and out?of agility ladder?with L foot leading and then R foot leading on the way back x2 laps ea, CGa, mod verbal cues for sequencing of  movements  Stepping over orange hurdle laterally?x10, no UE support, minAx1  Stepping up onto 4" step with UE support when stepping up onto R foot, no Ue for L foot, stepping up onto airex pad x10 ea side, mod verbal cues, CGA-minAx1 x10 ea  Airex balance?NBOS?eyes open/closed 3x30s in each condition; CGA  Airex balance?step forward alternating foot placement 3x20sec; CGA-minAx1 Sit to stands with blue foam in chair, feet on blue foam in // bars x3 with minAx1, without blue foam with CGA x7  Pt educated throughout session about proper posture and technique with exercises. Improved exercise technique, movement at target joints, use of target muscles after?mod verbal, visual, tactile cues.     PT Education - 08/23/18 1110    Education Details  balance strategies/technique    Person(s) Educated  Patient    Methods  Explanation;Demonstration;Tactile cues;Verbal cues    Comprehension  Verbalized understanding       PT Short Term Goals - 07/19/18 1123      PT SHORT TERM GOAL #1   Title  Patient will reduce timed up and go to <11 seconds to reduce fall risk and demonstrate improved transfer/gait ability.    Baseline  22.07 sec; 03/24/18: 14.2sec; 04/25/18: 14.0 sec with RW, 06/01/18: 12.7s, 07/19/18: 11.12 sec    Time  2    Period  Weeks    Status  Achieved      PT SHORT TERM GOAL #2   Title  Patient will be independent with ascend/descend 2 steps using single UE in step over step pattern without LOB.    Baseline  03/24/18: CGA, single UE support, step-to pattern without LOB; 04/25/18: CGA, single UE support, step-over-step pattern without LOB; 06/01/18: CGA, single UE support; 07/19/18: supervision with 1 rail, but does step to pattern descending;     Time  2    Period  Weeks    Status  Partially Met    Target Date  08/02/18        PT Long Term Goals - 08/16/18 1108      PT LONG TERM GOAL #1   Title  Patient will be independent in home exercise program to improve strength/mobility for  better functional independence with ADLs.    Baseline  04/25/18: unsure but does not remember doing HEP lately; 06/01/18: Pt performs intermittently    Time  8    Period  Weeks    Status  Achieved      PT LONG TERM GOAL #2   Title  Patient (> 9 years old) will complete five times sit to stand test in < 15 seconds indicating an increased LE strength and improved balance    Baseline  34.21 sec; 03/24/2018: 22.9 sec; 04/25/18: 18.5 sec with BUE support; 06/01/18: 15.5s with BUE support, still unable to  perform STS without UE support; 07/19/18: 20 sec with 1 HHA   08/16/18=16.64 with BUE support    Time  8    Period  Weeks    Status  Not Met    Target Date  10/11/18      PT LONG TERM GOAL #3   Title  Patient will increase six minute walk test distance to >1000 for progression to community ambulator and improve gait ability    Baseline  550 feet; 03/24/18: 795 ft.; 04/25/18: 975 ft with RW; 06/01/18: 1040' with RW; 07/19/18: 1050 without AD    Time  8    Period  Weeks    Status  Achieved      PT LONG TERM GOAL #4   Title  Patient will increase 10 meter walk test to >1.58ms as to improve gait speed for better community ambulation and to reduce fall risk    Baseline  .49 m/sec; 03/24/18: 1.01 m/sec; 06/01/18: self-selected: 9.7s = 1.03 m/s, fastest: 8.0s = 1.25 m/s    Period  Weeks    Status  Achieved      PT LONG TERM GOAL #5   Title  Pt will improve BERG by at least 3 points in order to demonstrate clinically significant improvement in balance.      Baseline  06/01/18: 44/56:    08/16/18=37/56    Time  8    Period  Weeks    Status  Partially Met    Target Date  10/11/18            Plan - 08/23/18 1111    Clinical Impression Statement  Patient with more difficulty with R foot clearance today during higher level balance activities today. Patient also with difficulty with eccentric control of RLE. The patient would benefit from further skilled PT to continue to progress towards goals,  improve strength, and functional activities.    Rehab Potential  Fair    PT Frequency  2x / week    PT Duration  8 weeks    PT Treatment/Interventions  Patient/family education;Neuromuscular re-education;Balance training;Stair training;Therapeutic activities;Therapeutic exercise;Gait training;Aquatic Therapy    PT Next Visit Plan  balance and strengthening    PT Home Exercise Plan  unable to provide due to short Evaluation session scheduled    Consulted and Agree with Plan of Care  Patient       Patient will benefit from skilled therapeutic intervention in order to improve the following deficits and impairments:  Abnormal gait, Decreased balance, Decreased endurance, Decreased mobility, Impaired flexibility, Decreased strength, Decreased knowledge of use of DME, Decreased activity tolerance, Difficulty walking  Visit Diagnosis: Other lack of coordination  Muscle weakness (generalized)  Difficulty in walking, not elsewhere classified     Problem List Patient Active Problem List   Diagnosis Date Noted  . Liver lesion 04/22/2018  . Goals of care, counseling/discussion 04/22/2018  . Cognitive deficit, post-stroke 12/31/2017  . Ataxia, post-stroke 12/31/2017  . Dysarthria, post-stroke   . Gait disturbance, post-stroke   . H/O cerebral parenchymal hemorrhage 11/04/2017  . Essential hypertension 11/04/2017  . Hyperlipidemia 11/04/2017  . Diabetes ([place] 11/04/2017  . CAD (coronary artery disease) 11/04/2017  . 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 Hypokalemia 11/04/2017  . Right-sided nontraumatic intracerebral hemorrhage of cerebellum ([place]   . History of cervical cancer   . History of TIA (transient ischemic attack)   . Acute systolic congestive heart failure ([place]   . Reactive hypertension   . Hypernatremia   .  Leukocytosis   . Acute blood loss anemia   . Elevated serum creatinine   . Acute respiratory failure with hypoxia ([place])   . IVH (intraventricular hemorrhage)  ([place]) 10/29/2017  . Hypoxia 10/28/2017  . Pancreatic lesion 05/24/2017  . 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 Nodule of upper lobe of left lung 06/04/2015  . 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 Cerebral thrombosis with cerebral infarction 04/03/2015  . Cancer of right colon ([place]) 03/28/2015  . CVA (cerebral infarction) 12/21/2014  . Polycythemia vera ([place]) 06/22/2006    Lieutenant Diego PT, DPT 11:15 AM,08/23/18 [place] MAIN [facility] SERVICES [address] [place], [place], [address] Phone: [phone]   Fax:  [phone]  Name: [name] MRN: [id number] Date of Birth: 1931/09/14

## 2018-08-25 ENCOUNTER — Encounter: Payer: Self-pay | Admitting: Occupational Therapy

## 2018-08-25 ENCOUNTER — Ambulatory Visit: Payer: Medicare Other

## 2018-08-25 ENCOUNTER — Ambulatory Visit: Payer: Medicare Other | Admitting: Occupational Therapy

## 2018-08-25 DIAGNOSIS — R278 Other lack of coordination: Secondary | ICD-10-CM

## 2018-08-25 DIAGNOSIS — Z8679 Personal history of other diseases of the circulatory system: Secondary | ICD-10-CM

## 2018-08-25 DIAGNOSIS — M6281 Muscle weakness (generalized): Secondary | ICD-10-CM

## 2018-08-25 DIAGNOSIS — R262 Difficulty in walking, not elsewhere classified: Secondary | ICD-10-CM

## 2018-08-25 DIAGNOSIS — R471 Dysarthria and anarthria: Secondary | ICD-10-CM

## 2018-08-25 NOTE — Therapy (Signed)
[place] MAIN [facility] SERVICES [address] [place], [place], [address] Phone: [phone]   Fax:  [phone]  Physical Therapy Treatment & Progress Note   Dates of reporting period  08/16/2018   to   08/25/2018    Patient Details  Name: [name] MRN: [id number] Date of Birth: [birth year] Referring Provider (PT): Charlett Blake   Encounter Date: 08/25/2018  PT End of Session - 08/25/18 1102    Visit Number  39    Number of Visits  3    Date for PT Re-Evaluation  10/11/18    Authorization Type  10/10 start of care 01/31/18, last goals 07/19/18     PT Start Time  1100    PT Stop Time  1143    PT Time Calculation (min)  43 min    Equipment Utilized During Treatment  Gait belt    Activity Tolerance  Patient tolerated treatment well;Patient limited by fatigue    Behavior During Therapy  WFL for tasks assessed/performed       Past Medical History:  Diagnosis Date  . Adenocarcinoma in situ of cervix   . Arthritis    hands  . Breast cancer ([place])   . Colon cancer ([place] )   . Diabetes mellitus without complication ([place])   . Endometrial carcinoma (HCC)    s/p total abdominal hysterectomy  . H/O compression fracture of spine 2014   thoracic spine  . H/O polycythemia vera   . H/O TIA (transient ischemic attack) and stroke 09/2014, 03/2015   No deficits  . Hypertension   . Hyperuricemia   . Microalbuminuria   . Polycythemia vera ([place])   . Recurrent falls   . Skin cancer    face, legs  . Stroke [facility]) 2008   no deficits  . Trochanteric bursitis   . Varicose veins    treated    Past Surgical History:  Procedure Laterality Date  . ABDOMINAL HYSTERECTOMY    . BOWEL RESECTION N/A 03/28/2015   Procedure: SMALL BOWEL RESECTION;  Surgeon: Leonie Green, MD;  Location: [facility] ORS;  Service: General;  Laterality: N/A;  . CATARACT EXTRACTION W/ INTRAOCULAR LENS IMPLANT Right   . CATARACT EXTRACTION W/PHACO Left 10/07/2015    Procedure: CATARACT EXTRACTION PHACO AND INTRAOCULAR LENS PLACEMENT (IOC);  Surgeon: Ronnell Freshwater, MD;  Location: [place];  Service: Ophthalmology;  Laterality: Left;  DIABETIC - oral meds VISION BLUE  . CORONARY ANGIOGRAPHY N/A 10/29/2017   Procedure: CORONARY ANGIOGRAPHY;  Surgeon: Dionisio David, MD;  Location: [place] CV LAB;  Service: Cardiovascular;  Laterality: N/A;  . EXPLORATORY LAPAROTOMY     for fibroids  . LEFT HEART CATH Right 10/29/2017   Procedure: Left Heart Cath;  Surgeon: Dionisio David, MD;  Location: [place] CV LAB;  Service: Cardiovascular;  Laterality: Right;  . TONSILLECTOMY      There were no vitals filed for this visit.  Subjective Assessment - 08/25/18 1104    Subjective  Patient has no complaints at teh start of session, stated she has not been doing her exercises because she is so busy with her taxes.    Pertinent History    85 year old female with adenocarcinoma of the cervix as well as endometrial carcinoma and polycythemia vera as well as diabetes who had hypertensive right cerebellar hemorrhage onset 10/28/2017.  She was treated initially at [facility] and then transferred to [facility].  Neurosurgical evaluation per Dr. Ronnald Ramp  concluded no surgery was needed.  The patient completed inpatient rehabilitation at [facility] (including SLP) 11/04/2017-11/18/2107 and was discharged at a 24-hour supervision level.  The patient has hired caregiver to provide 24-hour supervision. Patient has received home health rehab services, including SLP.    Limitations  Standing;Walking    How long can you walk comfortably?  able to walk as much as she wants at home, does not use her walker    Patient Stated Goals  Patient wants to walk better and not need the RW.     Currently in Pain?  No/denies         [facility] PT Assessment - 08/25/18 0001      Berg Balance Test   Sit to Stand  Able to stand  independently using hands     Standing Unsupported  Able to stand safely 2 minutes    Sitting with Back Unsupported but Feet Supported on Floor or Stool  Able to sit safely and securely 2 minutes    Stand to Sit  Controls descent by using hands    Transfers  Able to transfer safely, definite need of hands    Standing Unsupported with Eyes Closed  Able to stand 10 seconds safely    Standing Unsupported with Feet Together  Able to place feet together independently and stand 1 minute safely    From Standing, Reach Forward with Outstretched Arm  Can reach confidently >25 cm (10")    From Standing Position, Pick up Object from Floor  Able to pick up shoe safely and easily    From Standing Position, Turn to Look Behind Over each Shoulder  Looks behind from both sides and weight shifts well    Turn 360 Degrees  Able to turn 360 degrees safely one side only in 4 seconds or less    Standing Unsupported, Alternately Place Feet on Step/Stool  Able to complete 4 steps without aid or supervision    Standing Unsupported, One Foot in Front  Able to take small step independently and hold 30 seconds    Standing on One Leg  Able to lift leg independently and hold 5-10 seconds    Total Score  47       TREATMENT: Goals reassessed this session: BERG performed, 47/56 5 times sit to stand  10 MWT Navigated stairs with CGA-minAx1 without rail assist. Patient tends to perform step to pattern, unable to perform step over step without physical assist.   Therapeutic Exercises: Seated March with Resistance - 10 reps - 2 sets Seated Hip Abduction with Resistance - 10 reps - 2 sets  Seated Knee Extension AROM - 10 reps - 2 sets  Seated Hamstring Curls with Resistance - 10 reps - 2 sets  Sit to Stand without Arm Support - 10 reps - 1 sets Verbal cues for exercise technique/form, administered blue theraband for HEP.    PT Education - 08/25/18 1105    Education Details  Pt, POC, expectations, HEP    Person(s) Educated  Patient    Methods   Explanation;Tactile cues    Comprehension  Verbalized understanding;Returned demonstration;Verbal cues required       PT Short Term Goals - 08/25/18 1106      PT SHORT TERM GOAL #1   Title  Patient will reduce timed up and go to <11 seconds to reduce fall risk and demonstrate improved transfer/gait ability.    Baseline  22.07 sec; 03/24/18: 14.2sec; 04/25/18: 14.0 sec with RW, 06/01/18: 12.7s, 07/19/18: 11.12  sec    Time  2    Period  Weeks    Status  Achieved    Target Date  06/29/18      PT SHORT TERM GOAL #2   Title  Patient will be independent with ascend/descend 2 steps using single UE in step over step pattern without LOB.    Baseline  03/24/18: CGA, single UE support, step-to pattern without LOB; 04/25/18: CGA, single UE support, step-over-step pattern without LOB; 06/01/18: CGA, single UE support; 07/19/18: supervision with 1 rail, but does step to pattern descending;     Time  2    Period  Weeks    Status  Partially Met    Target Date  08/02/18        PT Long Term Goals - 08/25/18 1106      PT LONG TERM GOAL #1   Title  Patient will be independent in home exercise program to improve strength/mobility for better functional independence with ADLs.    Baseline  04/25/18: unsure but does not remember doing HEP lately; 06/01/18: Pt performs intermittently     Time  8    Period  Weeks    Status  Achieved      PT LONG TERM GOAL #2   Title  Patient (> 66 years old) will complete five times sit to stand test in < 15 seconds indicating an increased LE strength and improved balance    Baseline  34.21 sec; 03/24/2018: 22.9 sec; 04/25/18: 18.5 sec with BUE support; 06/01/18: 15.5s with BUE support, still unable to perform STS without UE support; 07/19/18: 20 sec with 1 HHA   08/16/18=16.64 with BUE support ; 13.55sec with BUE support, unable to perform wihtout UE support from standard chair height    Time  8    Period  Weeks    Status  On-going    Target Date  10/11/18      PT LONG TERM  GOAL #3   Title  Patient will increase six minute walk test distance to >1000 for progression to community ambulator and improve gait ability    Baseline  550 feet; 03/24/18: 795 ft.; 04/25/18: 975 ft with RW; 06/01/18: 1040' with RW; 07/19/18: 1050 without AD    Time  8    Period  Weeks    Status  Achieved      PT LONG TERM GOAL #4   Title  Patient will increase 10 meter walk test to >1.38ms as to improve gait speed for better community ambulation and to reduce fall risk    Baseline  .49 m/sec; 03/24/18: 1.01 m/sec; 06/01/18: self-selected: 9.7s = 1.03 m/s, fastest: 8.0s = 1.25 m/s 3/5 1.0 m/s without walker    Time  12    Period  Weeks    Status  Achieved      PT LONG TERM GOAL #5   Title  Pt will improve BERG by at least 3 points in order to demonstrate clinically significant improvement in balance.      Baseline  06/01/18: 44/56:    08/16/18=37/56; 08/25/2018 47/56    Time  8    Period  Weeks    Status  Partially Met    Target Date  10/11/18            Plan - 08/25/18 1232    Clinical Impression Statement  Patient goals reassessed this session, patient demonstrated some improvement in BERG outcome measure, 5 times sit to stand, and stair navigation. The patient  still has limitations in balance, gait, LE strength, and activty tolerance and would benefit from further skilled PT to continue to progress towards goals to maximize functional activities.     Rehab Potential  Fair    PT Frequency  2x / week    PT Duration  8 weeks    PT Treatment/Interventions  Patient/family education;Neuromuscular re-education;Balance training;Stair training;Therapeutic activities;Therapeutic exercise;Gait training;Aquatic Therapy    PT Next Visit Plan  balance and strengthening    PT Home Exercise Plan  see pt instruction section    Consulted and Agree with Plan of Care  Patient       Patient will benefit from skilled therapeutic intervention in order to improve the following deficits and  impairments:  Abnormal gait, Decreased balance, Decreased endurance, Decreased mobility, Impaired flexibility, Decreased strength, Decreased knowledge of use of DME, Decreased activity tolerance, Difficulty walking  Visit Diagnosis: Muscle weakness (generalized)  Difficulty in walking, not elsewhere classified  Other lack of coordination  Dysarthria  H/O cerebral parenchymal hemorrhage     Problem List Patient Active Problem List   Diagnosis Date Noted  . Liver lesion 04/22/2018  . Goals of care, counseling/discussion 04/22/2018  . Cognitive deficit, post-stroke 12/31/2017  . Ataxia, post-stroke 12/31/2017  . Dysarthria, post-stroke   . Gait disturbance, post-stroke   . H/O cerebral parenchymal hemorrhage 11/04/2017  . Essential hypertension 11/04/2017  . Hyperlipidemia 11/04/2017  . Diabetes ([place]) 11/04/2017  . CAD (coronary artery disease) 11/04/2017  . 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 Hypokalemia 11/04/2017  . Right-sided nontraumatic intracerebral hemorrhage of cerebellum ([place])   . History of cervical cancer   . History of TIA (transient ischemic attack)   . Acute systolic congestive heart failure ([place])   . Reactive hypertension   . Hypernatremia   . Leukocytosis   . Acute blood loss anemia   . Elevated serum creatinine   . Acute respiratory failure with hypoxia ([place])   . IVH (intraventricular hemorrhage) ([place]) 10/29/2017  . Hypoxia 10/28/2017  . Pancreatic lesion 05/24/2017  . 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 Nodule of upper lobe of left lung 06/04/2015  . 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 Cerebral thrombosis with cerebral infarction 04/03/2015  . Cancer of right colon ([place]) 03/28/2015  . CVA (cerebral infarction) 12/21/2014  . Polycythemia vera ([place]) 06/22/2006   Lieutenant Diego PT, DPT 12:39 PM,08/25/18 [phone]  [place] MAIN [facility] SERVICES [address]  [place], [place], [address] Phone: [phone]   Fax:  [phone]  Name: [name] MRN: [id number] Date of Birth: [birth year]

## 2018-08-25 NOTE — Therapy (Signed)
[place] MAIN [facility] SERVICES [address] [place], [place], [address] Phone: [phone]   Fax:  [phone]  Occupational Therapy Treatment  Patient Details  Name: [name] MRN: [id number] Date of Birth: Jun 01, 1932 No data recorded  Encounter Date: 08/25/2018  OT End of Session - 08/25/18 1159    Visit Number  45    Number of Visits  35    Date for OT Re-Evaluation  09/15/18    Authorization Time Period  Next progress report period starting 08/09/2018    OT Start Time  1145    OT Stop Time  1230    OT Time Calculation (min)  45 min    Activity Tolerance  Patient tolerated treatment well    Behavior During Therapy  [facility] for tasks assessed/performed       Past Medical History:  Diagnosis Date  . Adenocarcinoma in situ of cervix   . Arthritis    hands  . Breast cancer ([place])   . Colon cancer ([place])   . Diabetes mellitus without complication ([place])   . Endometrial carcinoma (H[facility])    s/p total abdominal hysterectomy  . H/O compression fracture of spine 2014   thoracic spine  . H/O polycythemia vera   . H/O TIA (transient ischemic attack) and stroke 09/2014, 03/2015   No deficits  . Hypertension   . Hyperuricemia   . Microalbuminuria   . Polycythemia vera ([place])   . Recurrent falls   . Skin cancer    face, legs  . Stroke [facility]) 2008   no deficits  . Trochanteric bursitis   . Varicose veins    treated    Past Surgical History:  Procedure Laterality Date  . ABDOMINAL HYSTERECTOMY    . BOWEL RESECTION N/A 03/28/2015   Procedure: SMALL BOWEL RESECTION;  Surgeon: Leonie Green, MD;  Location: [facility] ORS;  Service: General;  Laterality: N/A;  . CATARACT EXTRACTION W/ INTRAOCULAR LENS IMPLANT Right   . CATARACT EXTRACTION W/PHACO Left 10/07/2015   Procedure: CATARACT EXTRACTION PHACO AND INTRAOCULAR LENS PLACEMENT (IOC);  Surgeon: Ronnell Freshwater, MD;  Location: [place];  Service: Ophthalmology;  Laterality:  Left;  DIABETIC - oral meds VISION BLUE  . CORONARY ANGIOGRAPHY N/A 10/29/2017   Procedure: CORONARY ANGIOGRAPHY;  Surgeon: Dionisio David, MD;  Location: [place] CV LAB;  Service: Cardiovascular;  Laterality: N/A;  . EXPLORATORY LAPAROTOMY     for fibroids  . LEFT HEART CATH Right 10/29/2017   Procedure: Left Heart Cath;  Surgeon: Dionisio David, MD;  Location: [place] CV LAB;  Service: Cardiovascular;  Laterality: Right;  . TONSILLECTOMY      There were no vitals filed for this visit.  Subjective Assessment - 08/25/18 1158    Subjective   Pt. reports that she is doing okay today.    Pertinent History  85 year old female with adenocarcinoma of the cervix as well as endometrial carcinoma and polycythemia vera as well as diabetes who had hypertensive right cerebellar hemorrhage onset 10/28/2017.  She was treated initially at [facility] and then transferred to [facility].  Neurosurgical evaluation per Dr. Ronnald Ramp concluded no surgery was needed.  The patient completed inpatient rehabilitation at [facility] (including SLP) 11/04/2017-11/18/2107 and was discharged at a 24-hour supervision level.  The patient has hired caregiver to provide 24-hour supervision. Patient has received home health rehab services, including SLP.     Patient Stated Goals  Patient would like to  be independent in all tasks and be able to live alone again.    Currently in Pain?  No/denies      OT Treatment   Selfcare:  Pt. worked on Patent attorney or longer months ending in e, m, b, e, r, and automobile brands.  Pt. Continues to have difficulty writing, and connecting e, m, b, e in cursive writing on standard lined paper. Pt. worked on [employer] while moving the pen with more isolated digit movement, rather than writing with her right UE, forearm, wrist, and digits stiff as one unit. Pt. worked on prewriting exercises flexing, and extending her digits, and walking her  fingers up, and down a standard pen to improve distal mobility with the pen during writing. Pt. worked on Sales promotion account executive postal envelopes. Pt. education was provide The Pen Grip, and finger placement on the grip.                       OT Education - 08/25/18 1159    Education Details  Handwriting    Person(s) Educated  Patient    Methods  Explanation;Demonstration;Verbal cues    Comprehension  Verbalized understanding;Returned demonstration;Verbal cues required          OT Long Term Goals - 08/11/18 1025      OT LONG TERM GOAL #1   Title  Patient will complete bathing with modified independence    Baseline  1/14/20120: Pt. continues to have difficulty with washing the bottom of her feet, however is able to complete it.    Time  12    Period  Weeks    Status  Partially Met    Target Date  09/15/18      OT LONG TERM GOAL #2   Title  Patient will complete dressing skills with modified independence    Baseline  07/05/2018: Pt. conitnues to require assist with hooking her bra. Pt. requires care giver assist secondary to requiring increased time to complete.    Time  12    Period  Weeks    Status  On-going    Target Date  09/15/18      OT LONG TERM GOAL #3   Title  Patient will complete light meal prep with min assist    Baseline  07/05/2018: Pt. is able to fix herself something light to eat, However requires assist formeal preparation.    Time  12    Period  Weeks    Status  On-going    Target Date  09/15/18      OT LONG TERM GOAL #4   Title  Patient will increase R grip strength by 5# to open jars and containers with modified independence    Baseline  07/05/2018: Grip is improving, however requires assist to open containers.    Time  12    Period  Weeks    Status  On-going    Target Date  09/15/18      OT LONG TERM GOAL #5   Title  Patient will improve strength by 1 mm grade RUE to assist with obtaining items from the closet.     Baseline   07/05/2018: Pt. has made progress, and has difficulty reaching for items in the closet.    Time  8    Period  Weeks    Status  On-going    Target Date  09/15/18      OT LONG TERM GOAL #6   Title  Pt will increase handwriting legibility and speed to 50% while writing a 3 sentence paragraph in order to write thank you notes and birthday cards for friends and family    Baseline  07/05/2018: 50% writing lebility for one sentence with increased time.    Time  5    Period  Weeks    Status  On-going    Target Date  09/15/18            Plan - 08/25/18 1200    Clinical Impression Statement Pt. Reports having conflicts next week, and is unable to come to therapy, and reports that her schedule has been adjusted. Pt. reports that she was not able to practice anything at home because she has been busy dealing with her taxes.  Pt. conitnues to work on improving writing legibility in cursive form, and increasing distal mobility in her digits when formulating words, rather than writing with her forearm, wrist, and digits as one unit.    Occupational Profile and client history currently impacting functional performance  family more than 30 mins away, has 24 hour caregiver assist, decreased awareness, memory and safety    Occupational performance deficits (Please refer to evaluation for details):  ADL's;IADL's;Social Participation    Body Structure / Function / Physical Skills  ADL;UE functional use;FMC;ROM;Strength;IADL;Pain    OT Frequency  2x / week    OT Duration  12 weeks    OT Treatment/Interventions  Self-care/ADL training;Cryotherapy;Therapeutic exercise;DME and/or AE instruction;Functional Mobility Training;Cognitive remediation/compensation;Balance training;Neuromuscular education;Manual Therapy;Moist Heat;Therapeutic activities;Patient/family education    Consulted and Agree with Plan of Care  Patient    Family Member Consulted  Caregiver       Patient will benefit from skilled therapeutic  intervention in order to improve the following deficits and impairments:  Body Structure / Function / Physical Skills  Visit Diagnosis: Muscle weakness (generalized)  Other lack of coordination    Problem List Patient Active Problem List   Diagnosis Date Noted  . Liver lesion 04/22/2018  . Goals of care, counseling/discussion 04/22/2018  . Cognitive deficit, post-stroke 12/31/2017  . Ataxia, post-stroke 12/31/2017  . Dysarthria, post-stroke   . Gait disturbance, post-stroke   . H/O cerebral parenchymal hemorrhage 11/04/2017  . Essential hypertension 11/04/2017  . Hyperlipidemia 11/04/2017  . Diabetes ([place]) 11/04/2017  . CAD (coronary artery disease) 11/04/2017  . 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 Hypokalemia 11/04/2017  . Right-sided nontraumatic intracerebral hemorrhage of cerebellum ([place])   . History of cervical cancer   . History of TIA (transient ischemic attack)   . Acute systolic congestive heart failure ([place])   . Reactive hypertension   . Hypernatremia   . Leukocytosis   . Acute blood loss anemia   . Elevated serum creatinine   . Acute respiratory failure with hypoxia ([place])   . IVH (intraventricular hemorrhage) ([place]) 10/29/2017  . Hypoxia 10/28/2017  . Pancreatic lesion 05/24/2017  . 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 Nodule of upper lobe of left lung 06/04/2015  . 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 Cerebral thrombosis with cerebral infarction 04/03/2015  . Cancer of right colon ([place]) 03/28/2015  . CVA (cerebral infarction) 12/21/2014  . Polycythemia vera ([place]) 06/22/2006    Harrel Carina, MS, OTR/L 08/25/2018, 12:08 PM  [place] MAIN [facility] SERVICES [address] [place], [place], [address] Phone: [phone]   Fax:  [phone]  Name: [name] MRN: [id number] Date of Birth: 05/11/1932

## 2018-08-25 NOTE — Patient Instructions (Signed)
Access Code: [email or web address]  URL: [email or web address]  Date: 08/25/2018  Prepared by: Lieutenant Diego   Exercises  Seated March with Resistance - 10 reps - 2 sets - 1x daily - 5x weekly  Seated Hip Abduction with Resistance - 10 reps - 2 sets - 1x daily - 5x weekly  Seated Knee Extension AROM - 10 reps - 2 sets - 1x daily - 5x weekly  Seated Hamstring Curls with Resistance - 10 reps - 2 sets - 1x daily - 7x weekly  Sit to Stand without Arm Support - 10 reps - 1 sets - 1x daily - 7x weekly

## 2018-08-30 ENCOUNTER — Ambulatory Visit: Payer: Medicare Other | Admitting: Physical Therapy

## 2018-08-30 ENCOUNTER — Encounter: Payer: Medicare Other | Admitting: Occupational Therapy

## 2018-09-01 ENCOUNTER — Ambulatory Visit: Payer: Medicare Other

## 2018-09-01 ENCOUNTER — Encounter: Payer: Medicare Other | Admitting: Occupational Therapy

## 2018-09-05 ENCOUNTER — Ambulatory Visit: Payer: Medicare Other | Admitting: Occupational Therapy

## 2018-09-05 ENCOUNTER — Ambulatory Visit: Payer: Medicare Other | Admitting: Physical Therapy

## 2018-09-07 ENCOUNTER — Ambulatory Visit: Payer: Medicare Other | Admitting: Physical Therapy

## 2018-09-07 ENCOUNTER — Encounter: Payer: Medicare Other | Admitting: Occupational Therapy

## 2018-09-12 ENCOUNTER — Encounter: Payer: Self-pay | Admitting: Occupational Therapy

## 2018-09-12 NOTE — Therapy (Signed)
[place] MAIN [facility] SERVICES [address] [place], [place], [address] Phone: [phone]   Fax:  [phone]  Patient Details  Name: [name] MRN: [id number] Date of Birth: 1932-03-10 Referring Provider:  No ref. provider found  Encounter Date: 09/12/2018  Reached out to the pt. via the telephone to check in on the pt., inform them that the clinic is closed due to the pandemic, and answer questions about HEPs.  Harrel Carina, MS, OTR/L 09/12/2018, 2:01 PM  [place] MAIN [facility] SERVICES [address] [place], [place], [address] Phone: [phone]   Fax:  [phone]

## 2018-09-13 ENCOUNTER — Ambulatory Visit: Payer: Medicare Other | Admitting: Physical Therapy

## 2018-09-13 ENCOUNTER — Ambulatory Visit: Payer: Medicare Other | Admitting: Occupational Therapy

## 2018-09-13 NOTE — Therapy (Unsigned)
[place] MAIN [facility] SERVICES [address] [place], [place], [address] Phone: [phone]   Fax:  [phone]  Patient Details  Name: [name] MRN: [id number] Date of Birth: 10/12/1931 Referring Provider:  No ref. provider found  Encounter Date: 09/13/2018   DPT called patient to let patient know the status of the clinic and ensure that we will be reaching out to schedule when we re-open. PT spoke with caregiver, who reported they would pass the message onto the patient. No further questions or concerns at this time. DPT advised that should questions arise patient can reach out to the clinic for guidance via phone.   Lieutenant Diego PT, DPT 9:29 AM,09/13/18 [place] MAIN [facility] SERVICES [address] [place], [place], [address] Phone: [phone]   Fax:  [phone]

## 2018-09-15 ENCOUNTER — Ambulatory Visit: Payer: Medicare Other

## 2018-09-15 ENCOUNTER — Encounter: Payer: Medicare Other | Admitting: Occupational Therapy

## 2018-09-19 ENCOUNTER — Ambulatory Visit: Payer: Medicare Other | Admitting: Physical Therapy

## 2018-09-19 ENCOUNTER — Encounter: Payer: Medicare Other | Admitting: Occupational Therapy

## 2018-09-21 ENCOUNTER — Ambulatory Visit: Payer: Medicare Other | Admitting: Physical Therapy

## 2018-09-21 ENCOUNTER — Encounter: Payer: Medicare Other | Admitting: Occupational Therapy

## 2018-09-22 ENCOUNTER — Ambulatory Visit: Payer: Medicare Other | Admitting: Adult Health

## 2018-09-26 ENCOUNTER — Ambulatory Visit: Payer: Medicare Other | Admitting: Adult Health

## 2018-09-27 ENCOUNTER — Other Ambulatory Visit: Payer: Self-pay

## 2018-09-27 ENCOUNTER — Encounter: Payer: Medicare Other | Admitting: Occupational Therapy

## 2018-09-27 ENCOUNTER — Ambulatory Visit: Payer: Medicare Other

## 2018-09-27 ENCOUNTER — Emergency Department: Payer: Medicare Other

## 2018-09-27 ENCOUNTER — Emergency Department
Admission: EM | Admit: 2018-09-27 | Discharge: 2018-09-27 | Disposition: A | Discharge status: Home / Self Care | Payer: Medicare Other | Attending: Emergency Medicine | Admitting: Emergency Medicine

## 2018-09-27 ENCOUNTER — Encounter: Payer: Self-pay | Admitting: Emergency Medicine

## 2018-09-27 DIAGNOSIS — S0990XA Unspecified injury of head, initial encounter: Secondary | ICD-10-CM | POA: Insufficient documentation

## 2018-09-27 DIAGNOSIS — S4992XA Unspecified injury of left shoulder and upper arm, initial encounter: Secondary | ICD-10-CM | POA: Diagnosis present

## 2018-09-27 DIAGNOSIS — Z23 Encounter for immunization: Secondary | ICD-10-CM | POA: Diagnosis not present

## 2018-09-27 DIAGNOSIS — Y92008 Other place in unspecified non-institutional (private) residence as the place of occurrence of the external cause: Secondary | ICD-10-CM | POA: Insufficient documentation

## 2018-09-27 DIAGNOSIS — S4292XA Fracture of left shoulder girdle, part unspecified, initial encounter for closed fracture: Secondary | ICD-10-CM

## 2018-09-27 DIAGNOSIS — Y939 Activity, unspecified: Secondary | ICD-10-CM | POA: Insufficient documentation

## 2018-09-27 DIAGNOSIS — Y999 Unspecified external cause status: Secondary | ICD-10-CM | POA: Insufficient documentation

## 2018-09-27 DIAGNOSIS — Z7901 Long term (current) use of anticoagulants: Secondary | ICD-10-CM | POA: Diagnosis not present

## 2018-09-27 DIAGNOSIS — W109XXA Fall (on) (from) unspecified stairs and steps, initial encounter: Secondary | ICD-10-CM | POA: Insufficient documentation

## 2018-09-27 DIAGNOSIS — Z79899 Other long term (current) drug therapy: Secondary | ICD-10-CM | POA: Diagnosis not present

## 2018-09-27 DIAGNOSIS — S61212A Laceration without foreign body of right middle finger without damage to nail, initial encounter: Secondary | ICD-10-CM | POA: Diagnosis not present

## 2018-09-27 DIAGNOSIS — S81012A Laceration without foreign body, left knee, initial encounter: Secondary | ICD-10-CM | POA: Insufficient documentation

## 2018-09-27 DIAGNOSIS — Z87891 Personal history of nicotine dependence: Secondary | ICD-10-CM | POA: Diagnosis not present

## 2018-09-27 DIAGNOSIS — T148XXA Other injury of unspecified body region, initial encounter: Secondary | ICD-10-CM

## 2018-09-27 DIAGNOSIS — W19XXXA Unspecified fall, initial encounter: Secondary | ICD-10-CM

## 2018-09-27 DIAGNOSIS — S41111A Laceration without foreign body of right upper arm, initial encounter: Secondary | ICD-10-CM | POA: Insufficient documentation

## 2018-09-27 LAB — COMPREHENSIVE METABOLIC PANEL
ALT: 16 U/L (ref 0–44)
AST: 18 U/L (ref 15–41)
Albumin: 4.1 g/dL (ref 3.5–5.0)
Alkaline Phosphatase: 41 U/L (ref 38–126)
Anion gap: 8 (ref 5–15)
BUN: 37 mg/dL — ABNORMAL HIGH (ref 8–23)
CO2: 27 mmol/L (ref 22–32)
Calcium: 9.2 mg/dL (ref 8.9–10.3)
Chloride: 101 mmol/L (ref 98–111)
Creatinine, Ser: 1.32 mg/dL — ABNORMAL HIGH (ref 0.44–1.00)
GFR calc Af Amer: 43 mL/min — ABNORMAL LOW (ref >60)
GFR calc non Af Amer: 37 mL/min — ABNORMAL LOW (ref >60)
Glucose, Bld: 244 mg/dL — ABNORMAL HIGH (ref 70–99)
Potassium: 4.4 mmol/L (ref 3.5–5.1)
Sodium: 136 mmol/L (ref 135–145)
Total Bilirubin: 0.6 mg/dL (ref 0.3–1.2)
Total Protein: 7 g/dL (ref 6.5–8.1)

## 2018-09-27 LAB — CBC WITH DIFFERENTIAL/PLATELET
Abs Immature Granulocytes: 0.06 10*3/uL (ref 0.00–0.07)
Basophils Absolute: 0.1 10*3/uL (ref 0.0–0.1)
Basophils Relative: 2 %
Eosinophils Absolute: 0.2 10*3/uL (ref 0.0–0.5)
Eosinophils Relative: 2 %
HCT: 41.1 % (ref 36.0–46.0)
Hemoglobin: 13.1 g/dL (ref 12.0–15.0)
Immature Granulocytes: 1 %
Lymphocytes Relative: 14 %
Lymphs Abs: 1.1 10*3/uL (ref 0.7–4.0)
MCH: 31 pg (ref 26.0–34.0)
MCHC: 31.9 g/dL (ref 30.0–36.0)
MCV: 97.2 fL (ref 80.0–100.0)
Monocytes Absolute: 0.7 10*3/uL (ref 0.1–1.0)
Monocytes Relative: 9 %
Neutro Abs: 5.9 10*3/uL (ref 1.7–7.7)
Neutrophils Relative %: 72 %
Platelets: 463 10*3/uL — ABNORMAL HIGH (ref 150–400)
RBC: 4.23 MIL/uL (ref 3.87–5.11)
RDW: 14.7 % (ref 11.5–15.5)
WBC: 8.1 10*3/uL (ref 4.0–10.5)
nRBC: 0 % (ref 0.0–0.2)

## 2018-09-27 MED ORDER — ONDANSETRON HCL 4 MG/2ML IJ SOLN
INTRAMUSCULAR | Status: AC
  Administered 2018-09-27: 18:00:00 4 mg via INTRAVENOUS
  Filled 2018-09-27: qty 2

## 2018-09-27 MED ORDER — MORPHINE SULFATE (PF) 4 MG/ML IV SOLN
4.0000 mg | Freq: Once | INTRAVENOUS | Status: AC
  Administered 2018-09-27: 4 mg via INTRAVENOUS
  Filled 2018-09-27: qty 1

## 2018-09-27 MED ORDER — TETANUS-DIPHTH-ACELL PERTUSSIS 5-2.5-18.5 LF-MCG/0.5 IM SUSP
0.5000 mL | Freq: Once | INTRAMUSCULAR | Status: AC
  Administered 2018-09-27: 0.5 mL via INTRAMUSCULAR
  Filled 2018-09-27: qty 0.5

## 2018-09-27 MED ORDER — OXYCODONE-ACETAMINOPHEN 5-325 MG PO TABS: 1.0000 | ORAL_TABLET | ORAL | 0 refills | Status: DC | PRN

## 2018-09-27 MED ORDER — ONDANSETRON HCL 4 MG/2ML IJ SOLN
4.0000 mg | Freq: Once | INTRAMUSCULAR | Status: AC
  Administered 2018-09-27: 4 mg via INTRAVENOUS

## 2018-09-27 NOTE — ED Triage Notes (Signed)
Pt from home via EMS for mechanical fall. PT had head injury with fall, c/o shoulder pain. Pt is on blood thinners. MD at bedside . Pt A&OX4

## 2018-09-27 NOTE — ED Provider Notes (Signed)
[facility] Emergency Department Provider Note   ____________________________________________   First MD Initiated Contact with Patient 09/27/18 1623     (approximate)  I have reviewed the triage vital signs and the nursing notes.   HISTORY  Chief Complaint Fall    HPI [name] is a 85 y.o. female who was at home and tripped and fell on her back steps.  She fell down complains of pain in her left shoulder left knee and some skin tears on the right hand.  She does not think she passed out.  She is taking blood thinners.  He did hit her head.         Past Medical History:  Diagnosis Date  . Adenocarcinoma in situ of cervix   . Arthritis    hands  . Breast cancer ([place])   . Colon cancer ([place])   . Diabetes mellitus without complication ([place])   . Endometrial carcinoma (HCC)    s/p total abdominal hysterectomy  . H/O compression fracture of spine 2014   thoracic spine  . H/O polycythemia vera   . H/O TIA (transient ischemic attack) and stroke 09/2014, 03/2015   No deficits  . Hypertension   . Hyperuricemia   . Microalbuminuria   . Polycythemia vera ([place])   . Recurrent falls   . Skin cancer    face, legs  . Stroke [facility]) 2008   no deficits  . Trochanteric bursitis   . Varicose veins    treated    Patient Active Problem List   Diagnosis Date Noted  . Liver lesion 04/22/2018  . Goals of care, counseling/discussion 04/22/2018  . Cognitive deficit, post-stroke 12/31/2017  . Ataxia, post-stroke 12/31/2017  . Dysarthria, post-stroke   . Gait disturbance, post-stroke   . H/O cerebral parenchymal hemorrhage 11/04/2017  . Essential hypertension 11/04/2017  . Hyperlipidemia 11/04/2017  . Diabetes ([place]) 11/04/2017  . CAD (coronary artery disease) 11/04/2017  . 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 Hypokalemia 11/04/2017  . 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 History of cervical cancer   . History  of TIA (transient ischemic attack)   . Acute systolic congestive heart failure ([place])   . Reactive hypertension   . Hypernatremia   . Leukocytosis   . Acute blood loss anemia   . Elevated serum creatinine   . Acute respiratory failure with hypoxia ([place])   . IVH (intraventricular hemorrhage) ([place]) 10/29/2017  . Hypoxia 10/28/2017  . Pancreatic lesion 05/24/2017  . 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 Nodule of upper lobe of left lung 06/04/2015  . 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 Cerebral thrombosis with cerebral infarction 04/03/2015  . Cancer of right colon ([place]) 03/28/2015  . CVA (cerebral infarction) 12/21/2014  . Polycythemia vera ([place]) 06/22/2006    Past Surgical History:  Procedure Laterality Date  . ABDOMINAL HYSTERECTOMY    . BOWEL RESECTION N/A 03/28/2015   Procedure: SMALL BOWEL RESECTION;  Surgeon: Leonie Green, MD;  Location: [facility] ORS;  Service: General;  Laterality: N/A;  . CATARACT EXTRACTION W/ INTRAOCULAR LENS IMPLANT Right   . CATARACT EXTRACTION W/PHACO Left 10/07/2015   Procedure: CATARACT EXTRACTION PHACO AND INTRAOCULAR LENS PLACEMENT (IOC);  Surgeon: Ronnell Freshwater, MD;  Location: [facility];  Service: Ophthalmology;  Laterality: Left;  DIABETIC - oral meds VISION BLUE  . CORONARY ANGIOGRAPHY N/A 10/29/2017   Procedure: CORONARY ANGIOGRAPHY;  Surgeon: Dionisio David, MD;  Location: [place] [facility];  Service: Cardiovascular;  Laterality: N/A;  . EXPLORATORY LAPAROTOMY     for fibroids  . LEFT HEART CATH Right 10/29/2017   Procedure: Left Heart Cath;  Surgeon: Dionisio David, MD;  Location: [place] [facility];  Service: Cardiovascular;  Laterality: Right;  . TONSILLECTOMY      Prior to Admission medications   Medication Sig Start Date End Date Taking? Authorizing Provider  amLODipine (NORVASC) 2.5 MG tablet Take 1 tablet (2.5 mg total) by mouth daily. 11/17/17   Angiulli, Lavon Paganini, PA-C   blood glucose meter kit and supplies Dispense based on patient and insurance preference. Use up to four times daily as directed. (FOR ICD-10 E10.9, E11.9). Patient not taking: Reported on 07/08/2018 11/16/17   Angiulli, Lavon Paganini, PA-C  calcium-vitamin D (OSCAL WITH D) 500-200 MG-UNIT per tablet Take 1 tablet by mouth daily.     [provider]  carvedilol (COREG) 25 MG tablet Take 1 tablet (25 mg total) by mouth 2 (two) times daily with a meal. 11/16/17   Angiulli, Lavon Paganini, PA-C  clopidogrel (PLAVIX) 75 MG tablet Take 1 tablet (75 mg total) by mouth daily. 12/17/17   Venancio Poisson, NP  docusate sodium (COLACE) 250 MG capsule Take 250 mg by mouth daily as needed for constipation.    [provider]  fexofenadine (ALLEGRA ALLERGY) 180 MG tablet Take 1 tablet (180 mg total) by mouth daily. 12/17/17   Venancio Poisson, NP  hydroxyurea (HYDREA) 500 MG capsule Take 1 tablet daily except on Saturdays 11/16/17   Angiulli, Lavon Paganini, PA-C  insulin detemir (LEVEMIR) 100 unit/ml SOLN Inject 0.08 mLs (8 Units total) into the skin at bedtime. Patient taking differently: Inject 10 Units into the skin at bedtime.  11/17/17   Angiulli, Lavon Paganini, PA-C  nitroGLYCERIN (NITRODUR - DOSED IN MG/24 HR) 0.2 mg/hr patch Place 1 patch (0.2 mg total) onto the skin daily. 11/16/17   Angiulli, Lavon Paganini, PA-C  pantoprazole (PROTONIX) 40 MG tablet Take 1 tablet (40 mg total) by mouth daily. 11/17/17   Angiulli, Lavon Paganini, PA-C  pravastatin (PRAVACHOL) 20 MG tablet Take 1 tablet (20 mg total) by mouth at bedtime. 11/16/17   Angiulli, Lavon Paganini, PA-C  Probiotic Product (PROBIOTIC DAILY PO) Take 1 tablet by mouth daily.    [provider]  ramipril (ALTACE) 5 MG capsule Take 5 mg by mouth 2 (two) times daily.    [provider]  sertraline (ZOLOFT) 25 MG tablet Take 25 mg by mouth daily.    [provider]  spironolactone (ALDACTONE) 25 MG tablet Take 1 tablet (25 mg total) by mouth daily.  Patient taking differently: Take 12.5 mg by mouth daily.  11/16/17   Angiulli, Lavon Paganini, PA-C    Allergies Simvastatin  Family History  Problem Relation Age of Onset  . Cancer Brother        AML  . Heart attack Mother   . Breast cancer Mother   . Heart attack Father   . Heart attack Sister   . Diabetes Sister     Social History Social History   Tobacco Use  . Smoking status: Former Research scientist (life sciences)  . Smokeless tobacco: Never Used  . Tobacco comment: quit 30+ yrs ago  Substance Use Topics  . Alcohol use: No  . Drug use: No    Review of Systems  Constitutional: No fever/chills Eyes: No visual changes. ENT: No sore throat. Cardiovascular: Denies chest pain. Respiratory: Denies shortness of  breath. Gastrointestinal: No abdominal pain.  No nausea, no vomiting.  No diarrhea.  No constipation. Genitourinary: Negative for dysuria. Musculoskeletal: Negative for back pain. Skin: Negative for rash. Neurological: Negative for headaches, focal weakness   ____________________________________________   PHYSICAL EXAM:  VITAL SIGNS: ED Triage Vitals  Enc Vitals Group     BP 09/27/18 1626 (!) 185/93     Pulse Rate 09/27/18 1623 60     Resp 09/27/18 1623 16     Temp 09/27/18 1626 97.6 F (36.4 C)     Temp Source 09/27/18 1626 Oral     SpO2 09/27/18 1623 100 %     Weight --      Height --      Head Circumference --      Peak Flow --      Pain Score 09/27/18 1623 6     Pain Loc --      Pain Edu? --      Excl. in [place]? --     Constitutional: Alert and oriented. Well appearing and in no acute distress. Eyes: Conjunctivae are normal. PER. EOMI. Head: Bruises and superficial abrasions around the left side of the face especially I Nose: No congestion/rhinnorhea. Mouth/Throat: Mucous membranes are moist.  Oropharynx non-erythematous. Neck: No stridor.  Cardiovascular: Normal rate, regular rhythm. Grossly normal heart sounds.  Good peripheral circulation. Respiratory: Normal  respiratory effort.  No retractions. Lungs CTAB. Gastrointestinal: Soft and nontender. No distention. No abdominal bruits. No CVA tenderness. Musculoskeletal: Patient has pain and tenderness on palpation of the left shoulder and the left knee.  Left knee has a skin tear on it.  There are some small skin tears on the fingers the right hand as well.  No deep cuts that I can see. Neurologic:  Normal speech and language. No gross focal neurologic deficits are appreciated.  Skin:  Skin is warm, dry . No rash noted. Psychiatric: Mood and affect are normal. Speech and behavior are normal.  ____________________________________________   LABS (all labs ordered are listed, but only abnormal results are displayed)  Labs Reviewed  COMPREHENSIVE METABOLIC PANEL - Abnormal; Notable for the following components:      Result Value   Glucose, Bld 244 (*)    BUN 37 (*)    Creatinine, Ser 1.32 (*)    GFR calc non Af Amer 37 (*)    GFR calc Af Amer 43 (*)    All other components within normal limits  CBC WITH DIFFERENTIAL/PLATELET - Abnormal; Notable for the following components:   Platelets 463 (*)    All other components within normal limits   ____________________________________________  EKG   ____________________________________________  RADIOLOGY  ED MD interpretation: CT of the head neck read by radiology reviewed by me shows no acute disease.  X-rays of the hand and knee are negative for fracture x-ray of the shoulder shows an impacted fracture of the head of the humerus and  tuberosity.  Official radiology report(s): Ct Head Wo Contrast  Result Date: 09/27/2018 CLINICAL DATA:  Fall.  Injury. EXAM: CT HEAD WITHOUT CONTRAST CT CERVICAL SPINE WITHOUT CONTRAST TECHNIQUE: Multidetector CT imaging of the head and cervical spine was performed following the standard protocol without intravenous contrast. Multiplanar CT image reconstructions of the cervical spine were also generated. COMPARISON:  CT  head 10/31/2017 FINDINGS: CT HEAD FINDINGS Brain: Moderate atrophy and moderate microvascular ischemic changes in the white matter. Negative for acute hemorrhage or infarct. No mass or edema Vascular: Negative for hyperdense vessel. Skull:  Negative for fracture Sinuses/Orbits: Mucosal edema paranasal sinuses. Bilateral cataract surgery. No orbital mass. Other: None CT CERVICAL SPINE FINDINGS Alignment: Mild anterolisthesis C7-T1. Skull base and vertebrae: Negative for fracture Soft tissues and spinal canal: Negative for mass or soft tissue swelling Disc levels: Multilevel disc and facet degeneration throughout the cervical spine. Spurring most prominent at C5-6 causing spinal stenosis and foraminal stenosis bilaterally. Upper chest: 8 mm sub solid nodule left upper lobe unchanged from prior studies Other: None IMPRESSION: 1. No acute intracranial abnormality. Atrophy and chronic microvascular ischemia 2. Negative for cervical spine fracture. Moderate cervical spine degenerative changes. Electronically Signed   By: Franchot Gallo M.D.   On: 09/27/2018 17:25   Ct Cervical Spine Wo Contrast  Result Date: 09/27/2018 CLINICAL DATA:  Fall.  Injury. EXAM: CT HEAD WITHOUT CONTRAST CT CERVICAL SPINE WITHOUT CONTRAST TECHNIQUE: Multidetector CT imaging of the head and cervical spine was performed following the standard protocol without intravenous contrast. Multiplanar CT image reconstructions of the cervical spine were also generated. COMPARISON:  CT head 10/31/2017 FINDINGS: CT HEAD FINDINGS Brain: Moderate atrophy and moderate microvascular ischemic changes in the white matter. Negative for acute hemorrhage or infarct. No mass or edema Vascular: Negative for hyperdense vessel. Skull: Negative for fracture Sinuses/Orbits: Mucosal edema paranasal sinuses. Bilateral cataract surgery. No orbital mass. Other: None CT CERVICAL SPINE FINDINGS Alignment: Mild anterolisthesis C7-T1. Skull base and vertebrae: Negative for  fracture Soft tissues and spinal canal: Negative for mass or soft tissue swelling Disc levels: Multilevel disc and facet degeneration throughout the cervical spine. Spurring most prominent at C5-6 causing spinal stenosis and foraminal stenosis bilaterally. Upper chest: 8 mm sub solid nodule left upper lobe unchanged from prior studies Other: None IMPRESSION: 1. No acute intracranial abnormality. Atrophy and chronic microvascular ischemia 2. Negative for cervical spine fracture. Moderate cervical spine degenerative changes. Electronically Signed   By: Franchot Gallo M.D.   On: 09/27/2018 17:25   Dg Shoulder Left  Result Date: 09/27/2018 CLINICAL DATA:  Fall EXAM: LEFT SHOULDER - 2+ VIEW COMPARISON:  None. FINDINGS: Fracture of the left humeral neck with mild impaction and mild displacement. Fracture extends into the greater tuberosity. Mild degenerative change in the [facility] joint. No other fracture. No dislocation. IMPRESSION: Impacted fracture left humeral neck and greater tuberosity. Electronically Signed   By: Franchot Gallo M.D.   On: 09/27/2018 18:17   Dg Knee Complete 4 Views Left  Result Date: 09/27/2018 CLINICAL DATA:  Pain following fall EXAM: LEFT KNEE - COMPLETE 4+ VIEW COMPARISON:  None. FINDINGS: Frontal, lateral, and bilateral oblique views were obtained. There is no appreciable fracture or dislocation. No joint effusion. There is moderate generalized joint space narrowing. There is spurring in all compartments. No erosive change. There is calcification in the distal superficial femoral and popliteal artery regions. IMPRESSION: Moderate generalized osteoarthritic change. No fracture or dislocation. No joint effusion. Foci of atherosclerotic calcification noted. Electronically Signed   By: Lowella Grip III M.D.   On: 09/27/2018 18:17   Dg Hand Complete Right  Result Date: 09/27/2018 CLINICAL DATA:  Fall.  Pain EXAM: RIGHT HAND - COMPLETE 3+ VIEW COMPARISON:  None. FINDINGS: Negative for  fracture Advanced osteoarthritis throughout the DIP and PIP joints. Mild degenerative change base of thumb. IMPRESSION: Negative for fracture Electronically Signed   By: Franchot Gallo M.D.   On: 09/27/2018 18:18    ____________________________________________   PROCEDURES  Procedure(s) performed (including Critical Care):  Procedures   ____________________________________________   INITIAL IMPRESSION /  ASSESSMENT AND PLAN / ED COURSE  Discussed patient with Dr. Mack Guise.  He will follow-up in the office tomorrow.  We will put her in a shoulder immobilizer in the meantime.             ____________________________________________   FINAL CLINICAL IMPRESSION(S) / ED DIAGNOSES  Final diagnoses:  Shoulder fracture, left, closed, initial encounter  Multiple skin tears  Fall, initial encounter     ED Discharge Orders    None       Note:  This document was prepared using Dragon voice recognition software and may include unintentional dictation errors.    Nena Polio, MD 09/27/18 [place]

## 2018-09-27 NOTE — ED Notes (Signed)
Pt wounds on right middle and ring finger, right upper arm, and left knee were cleaned with NS and covered in xeroform and gauze.

## 2018-09-27 NOTE — Discharge Instructions (Addendum)
Wear the shoulder immobilizer.  Use the Percocet 1 pill 4 times a day as needed for pain.  Be careful the Percocet can make you woozy.  Do not fall on it.  Do not drive on it.  See Dr. Mack Guise tomorrow in the office.  He should call you with the appointment time.  He is the orthopedic surgeon will take care of your shoulder.  Return here for any increasing pain numbness or blueness of your arm.  Keep the dressings on your knee and hand.  Have your doctor check on them in the next couple days.  Return here for any signs of infection or if you have trouble getting follow-up.

## 2018-09-29 ENCOUNTER — Ambulatory Visit: Payer: Medicare Other

## 2018-09-29 ENCOUNTER — Encounter: Payer: Medicare Other | Admitting: Occupational Therapy

## 2018-09-30 ENCOUNTER — Encounter: Payer: Self-pay | Admitting: Occupational Therapy

## 2018-09-30 NOTE — Therapy (Signed)
[place] MAIN [facility] SERVICES [address] [place], [place], [address] Phone: [phone]   Fax:  [phone]  Patient Details  Name: [name] MRN: [id number] Date of Birth: 02-Jun-1932 Referring Provider:  No ref. provider found  Encounter Date: 09/30/2018   The [facility] outpatient clinics are closed at this time due to the COVID-19 epidemic. The patient was contacted in regards to their therapy services. The patient is in agreement that they are safe and consent to being on hold for therapy services until the [facility] outpatient facilities reopen. At which time, the patient will be contacted to schedule an appointment to resume therapy services.   Harrel Carina, MS, OTR/L 09/30/2018, 2:59 PM  [place] [facility]IN [facility] SERVICES [address] [place], [place], [address] Phone: [phone]   Fax:  [phone]

## 2018-10-04 ENCOUNTER — Ambulatory Visit: Payer: Medicare Other

## 2018-10-04 ENCOUNTER — Encounter: Payer: Medicare Other | Admitting: Occupational Therapy

## 2018-10-06 ENCOUNTER — Encounter: Payer: Medicare Other | Admitting: Occupational Therapy

## 2018-10-06 ENCOUNTER — Ambulatory Visit: Payer: Medicare Other

## 2018-10-10 ENCOUNTER — Ambulatory Visit: Payer: Medicare Other | Admitting: Hematology and Oncology

## 2018-10-10 ENCOUNTER — Other Ambulatory Visit: Payer: Medicare Other

## 2018-10-11 ENCOUNTER — Ambulatory Visit: Payer: Medicare Other

## 2018-10-11 ENCOUNTER — Encounter: Payer: Medicare Other | Admitting: Occupational Therapy

## 2018-10-13 ENCOUNTER — Ambulatory Visit: Payer: Medicare Other

## 2018-10-13 ENCOUNTER — Encounter: Payer: Medicare Other | Admitting: Occupational Therapy

## 2018-10-18 ENCOUNTER — Ambulatory Visit: Payer: Medicare Other | Admitting: Physical Therapy

## 2018-10-18 ENCOUNTER — Encounter: Payer: Medicare Other | Admitting: Occupational Therapy

## 2018-10-19 ENCOUNTER — Encounter: Payer: Self-pay | Admitting: Hematology and Oncology

## 2018-10-20 ENCOUNTER — Encounter: Payer: Medicare Other | Admitting: Occupational Therapy

## 2018-10-20 ENCOUNTER — Ambulatory Visit: Payer: Medicare Other | Admitting: Physical Therapy

## 2018-10-25 NOTE — Progress Notes (Signed)
[facility] [place] [facility]  [address], [address] [place], [place] [address] Phone: [phone]  Fax: [phone]   Clinic Office Visit:  10/26/2018    Referring physician: Johnston, John D, MD  Chief Complaint: [name] is a 85 y.o. 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  HPI: The patient was last seen in the hematology clinic on 07/08/2018.  At that time, she denied any complaints.  She denied any flushing or diarrhea.  Hematocrit was 42.9.  Platelet count was 391,000.  She underwent small volume phlebotomy.  She continued hydroxyurea 500 mg 5x/week.  She had multiple lesions on abdominal MRI.  Tumor board on 07/14/2018 suggested biopsy.  On 09/27/2018 she presented to [facility] ED following a fall. CT head and cervical spine on 09/27/2018 revealed no acute intracranial abnormality. There was atrophy and chronic microvascular ischemia. It was negative for cervical spine fracture. There was moderate cervical spine degenerative changes. Left shoulder x-ray on 09/27/2018 showed impacted fracture of the left humeral neck and greater tuberosity.  She was put in a shoulder immobilizer and scheduled to see Dr. Krasinski on 09/28/2018.  She continued PT and OT every other day until the outpatient clinic closed on 09/30/2018 due to the coronavirus pandemic.   During the interim, the patient is "doing fine."  Her niece, on the phone, is her primary historian. She has been on oxycodone and Tylenol and denies any pain. Blood pressure in the clinic today was 92/58, and she had one spell of dizziness and lightheadedness today. Her niece feels that she is dehydrated.  Appetite is normal and she has 1 bottle of Glucerna daily. She is not interested in meeting with a nutritionist at this time.    Past Medical History:  Diagnosis Date  . Adenocarcinoma in situ of cervix   . Arthritis     hands  . Breast cancer (HCC)   . Colon cancer (HCC)   . Diabetes mellitus without complication (HCC)   . Endometrial carcinoma (HCC)    s/p total abdominal hysterectomy  . H/O compression fracture of spine 2014   thoracic spine  . H/O polycythemia vera   . H/O TIA (transient ischemic attack) and stroke 09/2014, 03/2015   No deficits  . Hypertension   . Hyperuricemia   . Microalbuminuria   . Polycythemia vera (HCC)   . Recurrent falls   . Skin cancer    face, legs  . Stroke (HCC) 2008   no deficits  . Trochanteric bursitis   . Varicose veins    treated    Past Surgical History:  Procedure Laterality Date  . ABDOMINAL HYSTERECTOMY    . BOWEL RESECTION N/A 03/28/2015   Procedure: SMALL BOWEL RESECTION;  Surgeon: Jarvis Wilton Smith, MD;  Location: [facility] ORS;  Service: General;  Laterality: N/A;  . CATARACT EXTRACTION W/ INTRAOCULAR LENS IMPLANT Right   . CATARACT EXTRACTION W/PHACO Left 10/07/2015   Procedure: CATARACT EXTRACTION PHACO AND INTRAOCULAR LENS PLACEMENT (IOC);  Surgeon: Anita Prakash Vin-Parikh, MD;  Location: [facility];  Service: Ophthalmology;  Laterality: Left;  DIABETIC - oral meds VISION BLUE  . CORONARY ANGIOGRAPHY N/A 10/29/2017   Procedure: CORONARY ANGIOGRAPHY;  Surgeon: Khan, Shaukat A, MD;  Location: [facility] INV[facility]IVE [facility];  Service: Cardiovascular;  Laterality: N/A;  . EXPLORATORY LAPAROTOMY     for fibroids  . LEFT HEART CATH Right 10/29/2017   Procedure:   Left Heart Cath;  Surgeon: Dionisio David, MD;  Location: [facility];  Service: Cardiovascular;  Laterality: Right;  . TONSILLECTOMY      Family History  Problem Relation Age of Onset  . Cancer Brother        AML  . Heart attack Mother   . Breast cancer Mother   . Heart attack Father   . Heart attack Sister   . Diabetes Sister     Social History:  reports that she has quit smoking. She has never used smokeless tobacco. She reports that she does not drink alcohol or use  drugs. The patient's husband died of AML.  She has no family in [place]. 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  Her niece is [name]. The patient is alone today with her niece over the phone.   Allergies:  Allergies  Allergen Reactions  . Simvastatin     Other reaction(s): Muscle Pain    Current Medications: Current Outpatient Medications  Medication Sig Dispense Refill  . amLODipine (NORVASC) 2.5 MG tablet Take 1 tablet (2.5 mg total) by mouth daily. 30 tablet 0  . Biotin 10 MG CAPS Take 1 tablet by mouth daily.    . blood glucose meter kit and supplies Dispense based on patient and insurance preference. Use up to four times daily as directed. (FOR ICD-10 E10.9, E11.9). 1 each 0  . calcium-vitamin D (OSCAL WITH D) 500-200 MG-UNIT per tablet Take 1 tablet by mouth daily.     . carvedilol (COREG) 25 MG tablet Take 1 tablet (25 mg total) by mouth 2 (two) times daily with a meal. 60 tablet 0  . clopidogrel (PLAVIX) 75 MG tablet Take 1 tablet (75 mg total) by mouth daily. 90 tablet 4  . docusate sodium (COLACE) 250 MG capsule Take 250 mg by mouth daily as needed for constipation.    . fexofenadine (ALLEGRA ALLERGY) 180 MG tablet Take 1 tablet (180 mg total) by mouth daily.    . hydroxyurea (HYDREA) 500 MG capsule Take 1 tablet daily except on Saturdays 30 capsule 0  . insulin detemir (LEVEMIR) 100 unit/ml SOLN Inject 0.08 mLs (8 Units total) into the skin at bedtime. (Patient taking differently: Inject 10 Units into the skin at bedtime. ) 8 mL 1  . nitroGLYCERIN (NITRODUR - DOSED IN MG/24 HR) 0.2 mg/hr patch Place 1 patch (0.2 mg total) onto the skin daily. (Patient taking differently: Place 0.1 mg onto the skin daily. ) 30 patch 12  . pravastatin (PRAVACHOL) 20 MG tablet Take 1 tablet (20 mg total) by mouth at bedtime. 30 tablet 0  . ramipril (ALTACE) 5 MG capsule Take 5 mg by mouth 2 (two) times daily.    . sertraline (ZOLOFT) 25 MG  tablet Take 25 mg by mouth daily.    Marland Kitchen spironolactone (ALDACTONE) 25 MG tablet Take 1 tablet (25 mg total) by mouth daily. (Patient taking differently: Take 12.5 mg by mouth daily. ) 30 tablet 0  . pantoprazole (PROTONIX) 40 MG tablet Take 1 tablet (40 mg total) by mouth daily. (Patient not taking: Reported on 10/26/2018) 30 tablet 0  . Probiotic Product (PROBIOTIC DAILY PO) Take 1 tablet by mouth daily.     No current facility-administered medications for this visit.     Review of Systems  Constitutional: Positive for weight loss (6 pounds). Negative for chills, diaphoresis, fever and malaise/fatigue.       "Doing  fine."  HENT: Negative for congestion, ear pain, nosebleeds, sinus pain and sore throat.   Eyes: Negative.  Negative for blurred vision, double vision, photophobia and pain.  Respiratory: Negative.  Negative for cough, hemoptysis, sputum production and shortness of breath.   Cardiovascular: Negative for chest pain, palpitations, orthopnea and PND.       NSTEMI 10/2017. Ischemic CHF. LVEF 25%.  Gastrointestinal: Negative for abdominal pain, blood in stool, constipation, diarrhea, heartburn, melena, nausea and vomiting.       Picky eater.  Likes to eat [employer] hamburgers.  Genitourinary: Negative.  Negative for dysuria, frequency, hematuria and urgency.  Musculoskeletal: Positive for joint pain (left shoulder issue s/p humerus fracture). Negative for falls, myalgias and neck pain.       Baker's cyst.  Skin: Negative.  Negative for itching and rash.  Neurological: Positive for dizziness (BP 92/58 today), speech change (dysarthria) and weakness (generalized). Negative for tremors, seizures and headaches.       S/p cerebellar ICH. (+) dysmetria. (+) coordination and gait abnormalities.  Balance has improved.  Requires assistance with ADLs.   Endo/Heme/Allergies: Does not bruise/bleed easily.       Diabetes.  Psychiatric/Behavioral: Positive for memory loss. Negative for depression. The  patient is not nervous/anxious and does not have insomnia.   All other systems reviewed and are negative.  Performance status (ECOG):  3  Physical Exam  Constitutional: She is oriented to person, place, and time.  Frail elderly woman sitting comfortably in a wheelchair in no acute distress.  HENT:  Head: Normocephalic and atraumatic.  Mouth/Throat: Oropharynx is clear and moist. No oropharyngeal exudate.  Blonde hair. Wearing a mask.   Eyes: Pupils are equal, round, and reactive to light. Conjunctivae and EOM are normal. No scleral icterus.  Glasses.  Blue eyes.  Neck: Normal range of motion. Neck supple. No JVD present.  Cardiovascular: Normal rate, regular rhythm and normal heart sounds. Exam reveals no gallop and no friction rub.  No murmur heard. Pulmonary/Chest: Effort normal and breath sounds normal. No respiratory distress. She has no wheezes. She has no rales.  Abdominal: Soft. Bowel sounds are normal. She exhibits no distension and no mass. There is no abdominal tenderness. There is no rebound and no guarding.  Musculoskeletal:        General: No tenderness (no right shoulder tenderness on palpation) or edema.     Comments: Left arm in sling.  Lymphadenopathy:    She has no cervical adenopathy.    She has no axillary adenopathy.       Right: No supraclavicular adenopathy present.       Left: No supraclavicular adenopathy present.  Neurological: She is alert and oriented to person, place, and time.  Skin: Skin is warm and dry. No rash noted. She is not diaphoretic. No pallor.  Psychiatric: She has a normal mood and affect. Her behavior is normal. Judgment and thought content normal.  Difficulty finding words.  Nursing note and vitals reviewed.    Imaging studies: 06/11/2015: Chest CT revealed an 8 mm nodular density (spiculated on axial images) in the left apex.  10/10/2015: Chest CT revealed a stable 8 mm pulmonary nodule in the anterior left lung apex.   04/20/2016:  Chest, abdomen and pelvic CT revealed no definite evidence of recurrent or metastatic carcinoid tumor. There are no acute findings. There was a stable 8 mm pulmonary nodule in the left apex recommendations. There was a stable 1.3 cm benign-appearing cystic lesion in the pancreatic tail  likely representing an indolent cystic neoplasm. Recommendation was continued attention on follow-up or follow-up with abdominal MRI with and without contrast in 2 years.   10/28/2017: Chest CT angiogram revealed a stable 8 mm irregular density at the LEFT lung apex, probable scar. 04/20/2018: Abdomen MRI revealed stable cystic lesion in the tail of the pancreas compared to CT 03/22/2015.  There were multiple indeterminate lesions within the liver which were not identified on comparison CT from 04/20/2016. Differential would include focal nodular hyperplasia versus metastatic disease. 04/27/2018: PET scan revealed no focal hypermetabolic uptake in the region of the hepatic lesions seen on recent abdominal MRI. While lack of uptake was somewhat reassuring, continued close attention recommended as well differentiated or low-grade neoplasm can be poorly FDG avid.  There was focal hypermetabolic uptake in a nondilated small bowel loop of the right lower quadrant without underlying nodule or mass evident by CT. Finding were indeterminate, but patient has a reported history of carcinoid.  There was a 9 mm left upper lobe pulmonary nodule stable since at least 05/30/2015 and shows no hypermetabolism. 07/04/2018: Abdominal MRI revealed slight interval growth of several of the previously noted hepatic lesions, which all demonstrate diffusion restriction. The largest lesion in segment 5 was 2.2 x 1.6 cm (previously 1.9 x 1.3 cm), lesion in segment 4 B was 1.7 x 1.5 cm (previously 1.5 x 1.3 cm), lesion in segment 5 was 1.2 x 1.1 cm (previously 1.0 x 0.9 cm), and lesion in segment 8 was 1.3 x 0.9 cm (previously 1.0 x 0.8 cm).  These were  concerning for neoplasm and warrant further evaluation. These could be definitively characterized with MRI of the abdomen with and without IV gadolinium. Alternatively, biopsy of 1 of these lesions could be considered.  There was a 1.7 x 1.2 cm cystic appearing lesion in the tail of the pancreas, stable compared to the prior examinations dating back to 2016, considered benign.     Appointment on 10/26/2018  Component Date Value Ref Range Status  . Sodium 10/26/2018 134* 135 - 145 mmol/L Final  . Potassium 10/26/2018 4.1  3.5 - 5.1 mmol/L Final  . Chloride 10/26/2018 100  98 - 111 mmol/L Final  . CO2 10/26/2018 26  22 - 32 mmol/L Final  . Glucose, Bld 10/26/2018 179* 70 - 99 mg/dL Final  . BUN 10/26/2018 34* 8 - 23 mg/dL Final  . Creatinine, Ser 10/26/2018 1.49* 0.44 - 1.00 mg/dL Final  . Calcium 10/26/2018 9.0  8.9 - 10.3 mg/dL Final  . Total Protein 10/26/2018 6.7  6.5 - 8.1 g/dL Final  . Albumin 10/26/2018 3.9  3.5 - 5.0 g/dL Final  . AST 10/26/2018 19  15 - 41 U/L Final  . ALT 10/26/2018 15  0 - 44 U/L Final  . Alkaline Phosphatase 10/26/2018 52  38 - 126 U/L Final  . Total Bilirubin 10/26/2018 0.8  0.3 - 1.2 mg/dL Final  . GFR calc non Af Amer 10/26/2018 32* >60 mL/min Final  . GFR calc Af Amer 10/26/2018 37* >60 mL/min Final  . Anion gap 10/26/2018 8  5 - 15 Final   Performed at [facility], [address], [place], [place] [address]  . WBC 10/26/2018 7.8  4.0 - 10.5 K/uL Final  . RBC 10/26/2018 4.29  3.87 - 5.11 MIL/uL Final  . Hemoglobin 10/26/2018 13.5  12.0 - 15.0 g/dL Final  . HCT 10/26/2018 42.3  36.0 - 46.0 % Final  . MCV 10/26/2018 98.6  80.0 -  100.0 fL Final  . MCH 10/26/2018 31.5  26.0 - 34.0 pg Final  . MCHC 10/26/2018 31.9  30.0 - 36.0 g/dL Final  . RDW 10/26/2018 15.4  11.5 - 15.5 % Final  . Platelets 10/26/2018 392  150 - 400 K/uL Final  . nRBC 10/26/2018 0.0  0.0 - 0.2 % Final  . Neutrophils Relative % 10/26/2018 77  % Final  . Neutro Abs 10/26/2018  6.1  1.7 - 7.7 K/uL Final  . Lymphocytes Relative 10/26/2018 10  % Final  . Lymphs Abs 10/26/2018 0.8  0.7 - 4.0 K/uL Final  . Monocytes Relative 10/26/2018 8  % Final  . Monocytes Absolute 10/26/2018 0.6  0.1 - 1.0 K/uL Final  . Eosinophils Relative 10/26/2018 3  % Final  . Eosinophils Absolute 10/26/2018 0.2  0.0 - 0.5 K/uL Final  . Basophils Relative 10/26/2018 1  % Final  . Basophils Absolute 10/26/2018 0.1  0.0 - 0.1 K/uL Final  . Immature Granulocytes 10/26/2018 1  % Final  . Abs Immature Granulocytes 10/26/2018 0.04  0.00 - 0.07 K/uL Final   Performed at [facility], [address] [place], [place] [address]    Assessment:  [name] is a 86 y.o. female with JAK2+ polycythemia rubra vera (PV) diagnosed in 2008. She has undergone low phlebotomies x 2-3 to maintain a target hematocrit is 40-42.5.   She is on hydroxyurea 500 mg (1 pill) on Mondays, Wednesdays, Fridays and Saturdays (5 pills/week). Screening for von Willebrand and a platelet function was normal. She is on a baby aspirin. She has been on Plavix since her TIA/CVA.   She has a history of iron deficiency anemia status post GI evaluation. Colonoscopy in 07/2007 was negative. For persistent recurrent iron deficiency anemia, she underwent EGD and colonoscopy in 12/2011 and 09/11/2013.  She has a history of carcinoid tumor after presenting with a partial small bowel obstruction.  Abdominal and pelvic CT scan on 03/22/2015 revealed a 3 x 4.9 cm apple core lesion involving a loop of small bowel in the right mid abdomen. There was associated 4 x 1.8 cm adjacent mesenteric implant. Ileocolectomy on 03/28/2015 revealed a grade I neuroendocrine tumor (carcinoid) involving the terminal ileum and right colon.  Largest tumor was 2.5 cm. There were multiple adjacent nodules of a neuroendocrine tumor. Metastasis was in 9 of 12 lymph nodes.  Pathologic stage was T3N1.  Chromogranin was 4 on 05/07/2015, 3 on 04/13/2016,  4 on 11/09/2016, 10 on 01/24/2018, 205.8 on 07/08/2018, and 258.2 on 10/26/2018.   24 hour urine for 5HIAA was 5.2 mg/24 hours (0-14.9) and 6.2 on 07/18/2018.  Octreotide scan on 05/31/2015 revealed no evidence of metastatic disease.  There was an indeterminate 9 mm nodule in the left apex.    PET scan on 04/27/2018 revealed no focal hypermetabolic uptake in the region of the hepatic lesions seen on recent abdominal MRI. While lack of uptake was somewhat reassuring, continued close attention recommended as well differentiated or low-grade neoplasm can be poorly FDG avid.  There was focal hypermetabolic uptake in a nondilated small bowel loop of the right lower quadrant without underlying nodule or mass evident by CT. Finding were indeterminate, but patient has a reported history of carcinoid.  There was a 9 mm left upper lobe pulmonary nodule stable since at least 05/30/2015 and shows no hypermetabolism.  Abdominal MRI on 07/04/2018 revealed slight interval growth of several of the previously noted hepatic lesions, which all demonstrate diffusion restriction. The   largest lesion in segment 5 was 2.2 x 1.6 cm (previously 1.9 x 1.3 cm), lesion in segment 4 B was 1.7 x 1.5 cm (previously 1.5 x 1.3 cm), lesion in segment 5 was 1.2 x 1.1 cm (previously 1.0 x 0.9 cm), and lesion in segment 8 was 1.3 x 0.9 cm (previously 1.0 x 0.8 cm).  These were concerning for neoplasm and warrant further evaluation. These could be definitively characterized with MRI of the abdomen with and without IV gadolinium. Alternatively, biopsy of 1 of these lesions could be considered.  There was a 1.7 x 1.2 cm cystic appearing lesion in the tail of the pancreas, stable compared to the prior examinations dating back to 2016, considered benign.  AFP was 1.32 on 07/08/2018.   She had shingles involving the right S1/S2 dermatome in 05/2017.    She was admitted to [facility] then [facility] from 10/29/2017 - 11/16/2017.  She had a NSTEMI and  underwent cardiac cath which showed three-vessel disease and severe LV dysfunction.EF was 25%.  CABG was felt high risk. Post cardiac cath, head CT showed a RIGHT cerebellar parenchymal hemorrhage felt to be secondary to hypertensive etiology on dual antiplatelet therapy. She was scheduled for PCI with stent placement on 01/13/2018, however after second option with Dr. Callwood, PCI was deferred.  She is on aspirin and Plavix.  Symptomatically, she is recovering from a left humerus fracture.  Blood pressure is borderline today.  Hematocrit is 42.3.  Plan: 1. Labs today:  CBC with diff, CMP, chromogranin. 2. JAK2+ MPN (PV) Hematocrit 42.3 and hemoglobin 13.5.  MCV 98.6. Hematocrit goal <= 42. Hold phlebotomy today secondary to borderline blood pressure. Platelet count 392,000.  Platelet goal is </= 400,000. Continue hydroxyurea 500 mg 5x/week. 3. Hepatic lesions on MRI Multiple indeterminate lesions within the liver on abdomen MRI.  PET scan on 04/27/2018 revealed no focal hypermetabolic uptake in the region of the liver. Tumor board recommended biopsy. Suspect etiology may be secondary to carcinoid. Discuss 68-Ga DOTATATE PET scan. 4. Carcinoid tumor of the terminal ileum Patient denies any flushing or diarrhea. Chromogranin A level is 258.2 (high).             Schedule 68-Ga DOTATATE PET scan. 5.   Contact Dr Callwood regarding BP medication adjustment (clinic RN to talk to Dr Callwood's RN). 6.   RTC after DOTATATE PET scan for MD assessment and discussion regarding direction of therapy.  I discussed the assessment and treatment plan with the patient.  The patient was provided an opportunity to ask questions and all were answered.  The patient agreed with the plan and demonstrated an understanding of the instructions.  The patient was advised to call back or seek an in person evaluation if the symptoms worsen or if the condition fails to improve as anticipated.  I provided 25 minutes  (2:28 PM - 2:53 PM) of face-to-face video visit time during this this encounter and > 50% was spent counseling as documented under my assessment and plan.  I provided these services from the [place] office.  Melissa Corcoran, MD, PhD  10/26/2018, 2:16 PM  I, Molly Dorshimer, am acting as scribe for Melissa C. Corcoran, MD, PhD.  I, Melissa C. Corcoran, MD, have reviewed the above documentation for accuracy and completeness, and I agree with the above.   

## 2018-10-26 ENCOUNTER — Inpatient Hospital Stay (HOSPITAL_BASED_OUTPATIENT_CLINIC_OR_DEPARTMENT_OTHER): Payer: Medicare Other | Admitting: Hematology and Oncology

## 2018-10-26 ENCOUNTER — Other Ambulatory Visit: Payer: Self-pay | Admitting: Hematology and Oncology

## 2018-10-26 ENCOUNTER — Inpatient Hospital Stay: Payer: Medicare Other | Attending: Hematology and Oncology

## 2018-10-26 ENCOUNTER — Other Ambulatory Visit: Payer: Self-pay

## 2018-10-26 ENCOUNTER — Encounter: Payer: Self-pay | Admitting: Hematology and Oncology

## 2018-10-26 ENCOUNTER — Telehealth: Payer: Self-pay

## 2018-10-26 ENCOUNTER — Inpatient Hospital Stay: Payer: Medicare Other

## 2018-10-26 VITALS — BP 92/58 | HR 70 | Temp 98.0°F | Resp 18 | Wt 100.5 lb

## 2018-10-26 DIAGNOSIS — D45 Polycythemia vera: Secondary | ICD-10-CM

## 2018-10-26 DIAGNOSIS — D3A029 Benign carcinoid tumor of the large intestine, unspecified portion: Secondary | ICD-10-CM

## 2018-10-26 DIAGNOSIS — K769 Liver disease, unspecified: Secondary | ICD-10-CM | POA: Diagnosis not present

## 2018-10-26 DIAGNOSIS — C7A00 Malignant carcinoid tumor of unspecified site: Secondary | ICD-10-CM

## 2018-10-26 DIAGNOSIS — E119 Type 2 diabetes mellitus without complications: Secondary | ICD-10-CM | POA: Diagnosis not present

## 2018-10-26 DIAGNOSIS — Z87891 Personal history of nicotine dependence: Secondary | ICD-10-CM

## 2018-10-26 DIAGNOSIS — R42 Dizziness and giddiness: Secondary | ICD-10-CM

## 2018-10-26 DIAGNOSIS — Z8673 Personal history of transient ischemic attack (TIA), and cerebral infarction without residual deficits: Secondary | ICD-10-CM

## 2018-10-26 DIAGNOSIS — Z7982 Long term (current) use of aspirin: Secondary | ICD-10-CM

## 2018-10-26 LAB — CBC WITH DIFFERENTIAL/PLATELET
Abs Immature Granulocytes: 0.04 10*3/uL (ref 0.00–0.07)
Basophils Absolute: 0.1 10*3/uL (ref 0.0–0.1)
Basophils Relative: 1 %
Eosinophils Absolute: 0.2 10*3/uL (ref 0.0–0.5)
Eosinophils Relative: 3 %
HCT: 42.3 % (ref 36.0–46.0)
Hemoglobin: 13.5 g/dL (ref 12.0–15.0)
Immature Granulocytes: 1 %
Lymphocytes Relative: 10 %
Lymphs Abs: 0.8 10*3/uL (ref 0.7–4.0)
MCH: 31.5 pg (ref 26.0–34.0)
MCHC: 31.9 g/dL (ref 30.0–36.0)
MCV: 98.6 fL (ref 80.0–100.0)
Monocytes Absolute: 0.6 10*3/uL (ref 0.1–1.0)
Monocytes Relative: 8 %
Neutro Abs: 6.1 10*3/uL (ref 1.7–7.7)
Neutrophils Relative %: 77 %
Platelets: 392 10*3/uL (ref 150–400)
RBC: 4.29 MIL/uL (ref 3.87–5.11)
RDW: 15.4 % (ref 11.5–15.5)
WBC: 7.8 10*3/uL (ref 4.0–10.5)
nRBC: 0 % (ref 0.0–0.2)

## 2018-10-26 LAB — COMPREHENSIVE METABOLIC PANEL
ALT: 15 U/L (ref 0–44)
AST: 19 U/L (ref 15–41)
Albumin: 3.9 g/dL (ref 3.5–5.0)
Alkaline Phosphatase: 52 U/L (ref 38–126)
Anion gap: 8 (ref 5–15)
BUN: 34 mg/dL — ABNORMAL HIGH (ref 8–23)
CO2: 26 mmol/L (ref 22–32)
Calcium: 9 mg/dL (ref 8.9–10.3)
Chloride: 100 mmol/L (ref 98–111)
Creatinine, Ser: 1.49 mg/dL — ABNORMAL HIGH (ref 0.44–1.00)
GFR calc Af Amer: 37 mL/min — ABNORMAL LOW (ref >60)
GFR calc non Af Amer: 32 mL/min — ABNORMAL LOW (ref >60)
Glucose, Bld: 179 mg/dL — ABNORMAL HIGH (ref 70–99)
Potassium: 4.1 mmol/L (ref 3.5–5.1)
Sodium: 134 mmol/L — ABNORMAL LOW (ref 135–145)
Total Bilirubin: 0.8 mg/dL (ref 0.3–1.2)
Total Protein: 6.7 g/dL (ref 6.5–8.1)

## 2018-10-26 NOTE — Progress Notes (Signed)
Patient her for follow up. Denies any concerns at this time.

## 2018-10-26 NOTE — Telephone Encounter (Signed)
Spoke with Bo Merino, Nurse of Dr. Clayborn Bigness, and informed her of hypotensive episode in office. Advised of cardiac medications that niece/patient confirmed during visit. Nurse states patient is to be taking Carvedilol 12.5 MG BID, however patient reports 25 MG bid. Nurse is to make Dr. Clayborn Bigness aware and reach out to patient to make aware of changes.

## 2018-10-27 LAB — CHROMOGRANIN A: Chromogranin A (ng/mL): 258.2 ng/mL — ABNORMAL HIGH (ref 0.0–101.8)

## 2018-10-28 DIAGNOSIS — N182 Chronic kidney disease, stage 2 (mild): Secondary | ICD-10-CM | POA: Insufficient documentation

## 2018-11-12 ENCOUNTER — Other Ambulatory Visit: Payer: Self-pay | Admitting: Adult Health

## 2018-11-15 ENCOUNTER — Other Ambulatory Visit: Payer: Self-pay

## 2018-11-30 ENCOUNTER — Other Ambulatory Visit: Payer: Self-pay

## 2018-11-30 ENCOUNTER — Emergency Department: Payer: Medicare Other

## 2018-11-30 ENCOUNTER — Inpatient Hospital Stay
Admission: EM | Admit: 2018-11-30 | Discharge: 2018-12-04 | DRG: 470 | Disposition: A | Discharge status: Home Health | Payer: Medicare Other | Attending: Internal Medicine | Admitting: Internal Medicine

## 2018-11-30 DIAGNOSIS — I10 Essential (primary) hypertension: Secondary | ICD-10-CM | POA: Diagnosis present

## 2018-11-30 DIAGNOSIS — W19XXXA Unspecified fall, initial encounter: Secondary | ICD-10-CM | POA: Diagnosis present

## 2018-11-30 DIAGNOSIS — Z87891 Personal history of nicotine dependence: Secondary | ICD-10-CM

## 2018-11-30 DIAGNOSIS — F329 Major depressive disorder, single episode, unspecified: Secondary | ICD-10-CM | POA: Diagnosis present

## 2018-11-30 DIAGNOSIS — Z79899 Other long term (current) drug therapy: Secondary | ICD-10-CM

## 2018-11-30 DIAGNOSIS — Z66 Do not resuscitate: Secondary | ICD-10-CM | POA: Diagnosis present

## 2018-11-30 DIAGNOSIS — Z85038 Personal history of other malignant neoplasm of large intestine: Secondary | ICD-10-CM | POA: Diagnosis not present

## 2018-11-30 DIAGNOSIS — Z8673 Personal history of transient ischemic attack (TIA), and cerebral infarction without residual deficits: Secondary | ICD-10-CM

## 2018-11-30 DIAGNOSIS — S72012A Unspecified intracapsular fracture of left femur, initial encounter for closed fracture: Secondary | ICD-10-CM | POA: Diagnosis present

## 2018-11-30 DIAGNOSIS — Z8249 Family history of ischemic heart disease and other diseases of the circulatory system: Secondary | ICD-10-CM | POA: Diagnosis not present

## 2018-11-30 DIAGNOSIS — E119 Type 2 diabetes mellitus without complications: Secondary | ICD-10-CM | POA: Diagnosis present

## 2018-11-30 DIAGNOSIS — Z85828 Personal history of other malignant neoplasm of skin: Secondary | ICD-10-CM | POA: Diagnosis not present

## 2018-11-30 DIAGNOSIS — Z806 Family history of leukemia: Secondary | ICD-10-CM | POA: Diagnosis not present

## 2018-11-30 DIAGNOSIS — Z833 Family history of diabetes mellitus: Secondary | ICD-10-CM | POA: Diagnosis not present

## 2018-11-30 DIAGNOSIS — Z7902 Long term (current) use of antithrombotics/antiplatelets: Secondary | ICD-10-CM | POA: Diagnosis not present

## 2018-11-30 DIAGNOSIS — D45 Polycythemia vera: Secondary | ICD-10-CM | POA: Diagnosis present

## 2018-11-30 DIAGNOSIS — Y92009 Unspecified place in unspecified non-institutional (private) residence as the place of occurrence of the external cause: Secondary | ICD-10-CM | POA: Diagnosis not present

## 2018-11-30 DIAGNOSIS — E785 Hyperlipidemia, unspecified: Secondary | ICD-10-CM | POA: Diagnosis present

## 2018-11-30 DIAGNOSIS — Z1159 Encounter for screening for other viral diseases: Secondary | ICD-10-CM

## 2018-11-30 DIAGNOSIS — Z96649 Presence of unspecified artificial hip joint: Secondary | ICD-10-CM

## 2018-11-30 DIAGNOSIS — F05 Delirium due to known physiological condition: Secondary | ICD-10-CM | POA: Diagnosis not present

## 2018-11-30 DIAGNOSIS — Z803 Family history of malignant neoplasm of breast: Secondary | ICD-10-CM | POA: Diagnosis not present

## 2018-11-30 DIAGNOSIS — I2510 Atherosclerotic heart disease of native coronary artery without angina pectoris: Secondary | ICD-10-CM | POA: Diagnosis present

## 2018-11-30 DIAGNOSIS — I252 Old myocardial infarction: Secondary | ICD-10-CM

## 2018-11-30 DIAGNOSIS — Z853 Personal history of malignant neoplasm of breast: Secondary | ICD-10-CM

## 2018-11-30 DIAGNOSIS — S72002A Fracture of unspecified part of neck of left femur, initial encounter for closed fracture: Secondary | ICD-10-CM | POA: Diagnosis present

## 2018-11-30 HISTORY — DX: Acute myocardial infarction, unspecified: I21.9

## 2018-11-30 LAB — BASIC METABOLIC PANEL
Anion gap: 9 (ref 5–15)
BUN: 32 mg/dL — ABNORMAL HIGH (ref 8–23)
CO2: 25 mmol/L (ref 22–32)
Calcium: 8.9 mg/dL (ref 8.9–10.3)
Chloride: 103 mmol/L (ref 98–111)
Creatinine, Ser: 1.07 mg/dL — ABNORMAL HIGH (ref 0.44–1.00)
GFR calc Af Amer: 55 mL/min — ABNORMAL LOW (ref >60)
GFR calc non Af Amer: 48 mL/min — ABNORMAL LOW (ref >60)
Glucose, Bld: 224 mg/dL — ABNORMAL HIGH (ref 70–99)
Potassium: 4.3 mmol/L (ref 3.5–5.1)
Sodium: 137 mmol/L (ref 135–145)

## 2018-11-30 LAB — URINALYSIS, COMPLETE (UACMP) WITH MICROSCOPIC
Bacteria, UA: NONE SEEN
Bilirubin Urine: NEGATIVE
Glucose, UA: 50 mg/dL — AB
Hgb urine dipstick: NEGATIVE
Ketones, ur: 5 mg/dL — AB
Leukocytes,Ua: NEGATIVE
Nitrite: NEGATIVE
Protein, ur: NEGATIVE mg/dL
Specific Gravity, Urine: 1.014 (ref 1.005–1.030)
pH: 5 (ref 5.0–8.0)

## 2018-11-30 LAB — CBC WITH DIFFERENTIAL/PLATELET
Abs Immature Granulocytes: 0.17 10*3/uL — ABNORMAL HIGH (ref 0.00–0.07)
Basophils Absolute: 0.1 10*3/uL (ref 0.0–0.1)
Basophils Relative: 1 %
Eosinophils Absolute: 0.1 10*3/uL (ref 0.0–0.5)
Eosinophils Relative: 1 %
HCT: 44.5 % (ref 36.0–46.0)
Hemoglobin: 14.3 g/dL (ref 12.0–15.0)
Immature Granulocytes: 1 %
Lymphocytes Relative: 4 %
Lymphs Abs: 0.6 10*3/uL — ABNORMAL LOW (ref 0.7–4.0)
MCH: 30.4 pg (ref 26.0–34.0)
MCHC: 32.1 g/dL (ref 30.0–36.0)
MCV: 94.7 fL (ref 80.0–100.0)
Monocytes Absolute: 0.6 10*3/uL (ref 0.1–1.0)
Monocytes Relative: 4 %
Neutro Abs: 14.2 10*3/uL — ABNORMAL HIGH (ref 1.7–7.7)
Neutrophils Relative %: 89 %
Platelets: 445 10*3/uL — ABNORMAL HIGH (ref 150–400)
RBC: 4.7 MIL/uL (ref 3.87–5.11)
RDW: 15.7 % — ABNORMAL HIGH (ref 11.5–15.5)
WBC: 15.8 10*3/uL — ABNORMAL HIGH (ref 4.0–10.5)
nRBC: 0 % (ref 0.0–0.2)

## 2018-11-30 LAB — GLUCOSE, CAPILLARY
Glucose-Capillary: 210 mg/dL — ABNORMAL HIGH (ref 70–99)
Glucose-Capillary: 171 mg/dL — ABNORMAL HIGH (ref 70–99)

## 2018-11-30 LAB — SURGICAL PCR SCREEN
MRSA, PCR: NEGATIVE
Staphylococcus aureus: NEGATIVE

## 2018-11-30 LAB — PROTIME-INR
INR: 1.2 (ref 0.8–1.2)
Prothrombin Time: 14.8 seconds (ref 11.4–15.2)

## 2018-11-30 LAB — SARS CORONAVIRUS 2 BY RT PCR (HOSPITAL ORDER, PERFORMED IN ~~LOC~~ HOSPITAL LAB): SARS Coronavirus 2: NEGATIVE

## 2018-11-30 LAB — HEMOGLOBIN A1C
Hgb A1c MFr Bld: 7 % — ABNORMAL HIGH (ref 4.8–5.6)
Mean Plasma Glucose: 154.2 mg/dL

## 2018-11-30 LAB — ABO/RH: ABO/RH(D): A POS

## 2018-11-30 MED ORDER — CARVEDILOL 12.5 MG PO TABS
12.5000 mg | ORAL_TABLET | Freq: Two times a day (BID) | ORAL | Status: DC
  Administered 2018-11-30 – 2018-12-01 (×3): 12.5 mg via ORAL
  Filled 2018-11-30 (×4): qty 1

## 2018-11-30 MED ORDER — PANTOPRAZOLE SODIUM 40 MG PO TBEC
40.0000 mg | DELAYED_RELEASE_TABLET | Freq: Every day | ORAL | Status: DC
  Administered 2018-12-03 – 2018-12-04 (×2): 40 mg via ORAL
  Filled 2018-11-30 (×3): qty 1

## 2018-11-30 MED ORDER — ACETAMINOPHEN 325 MG PO TABS: 650.0000 mg | ORAL_TABLET | Freq: Four times a day (QID) | ORAL | Status: DC | PRN

## 2018-11-30 MED ORDER — RISAQUAD PO CAPS
1.0000 | ORAL_CAPSULE | Freq: Every day | ORAL | Status: DC
  Administered 2018-12-03 – 2018-12-04 (×2): 1 via ORAL
  Filled 2018-11-30 (×4): qty 1

## 2018-11-30 MED ORDER — OXYCODONE HCL 5 MG PO TABS: 5.0000 mg | ORAL_TABLET | ORAL | Status: DC | PRN

## 2018-11-30 MED ORDER — INSULIN ASPART 100 UNIT/ML ~~LOC~~ SOLN: 0.0000 [IU] | Freq: Every day | SUBCUTANEOUS | Status: DC

## 2018-11-30 MED ORDER — PRAVASTATIN SODIUM 20 MG PO TABS
20.0000 mg | ORAL_TABLET | Freq: Every day | ORAL | Status: DC
  Administered 2018-11-30 – 2018-12-02 (×2): 20 mg via ORAL
  Filled 2018-11-30 (×3): qty 1

## 2018-11-30 MED ORDER — SPIRONOLACTONE 25 MG PO TABS
12.5000 mg | ORAL_TABLET | Freq: Every day | ORAL | Status: DC
  Administered 2018-12-01 – 2018-12-04 (×3): 12.5 mg via ORAL
  Filled 2018-11-30: qty 0.5
  Filled 2018-11-30: qty 1
  Filled 2018-11-30 (×2): qty 0.5
  Filled 2018-11-30: qty 1
  Filled 2018-11-30: qty 0.5
  Filled 2018-11-30: qty 1

## 2018-11-30 MED ORDER — NITROGLYCERIN 0.1 MG/HR TD PT24
0.1000 mg | MEDICATED_PATCH | Freq: Every day | TRANSDERMAL | Status: DC
  Administered 2018-11-30 – 2018-12-04 (×5): 0.1 mg via TRANSDERMAL
  Filled 2018-11-30 (×5): qty 1

## 2018-11-30 MED ORDER — CEFAZOLIN SODIUM-DEXTROSE 2-4 GM/100ML-% IV SOLN
2.0000 g | Freq: Once | INTRAVENOUS | Status: DC
  Filled 2018-11-30: qty 100

## 2018-11-30 MED ORDER — RAMIPRIL 5 MG PO CAPS
5.0000 mg | ORAL_CAPSULE | Freq: Two times a day (BID) | ORAL | Status: DC
  Administered 2018-11-30 – 2018-12-01 (×2): 5 mg via ORAL
  Filled 2018-11-30 (×5): qty 1

## 2018-11-30 MED ORDER — SERTRALINE HCL 50 MG PO TABS
25.0000 mg | ORAL_TABLET | Freq: Every day | ORAL | Status: DC
  Administered 2018-12-03 – 2018-12-04 (×2): 25 mg via ORAL
  Filled 2018-11-30 (×3): qty 1

## 2018-11-30 MED ORDER — CALCIUM CARBONATE-VITAMIN D 500-200 MG-UNIT PO TABS
1.0000 | ORAL_TABLET | Freq: Every day | ORAL | Status: DC
  Administered 2018-12-03 – 2018-12-04 (×2): 1 via ORAL
  Filled 2018-11-30 (×3): qty 1

## 2018-11-30 MED ORDER — MORPHINE SULFATE (PF) 2 MG/ML IV SOLN
1.0000 mg | INTRAVENOUS | Status: DC | PRN
  Administered 2018-11-30: 1 mg via INTRAVENOUS
  Filled 2018-11-30: qty 1

## 2018-11-30 MED ORDER — MORPHINE SULFATE (PF) 4 MG/ML IV SOLN
4.0000 mg | Freq: Once | INTRAVENOUS | Status: AC
  Administered 2018-11-30: 4 mg via INTRAVENOUS
  Filled 2018-11-30: qty 1

## 2018-11-30 MED ORDER — MUPIROCIN 2 % EX OINT
1.0000 "application " | TOPICAL_OINTMENT | Freq: Two times a day (BID) | CUTANEOUS | Status: DC
  Administered 2018-12-02 – 2018-12-04 (×3): 1 via NASAL
  Filled 2018-11-30: qty 22

## 2018-11-30 MED ORDER — DOCUSATE SODIUM 100 MG PO CAPS: 200.0000 mg | ORAL_CAPSULE | Freq: Every day | ORAL | Status: DC | PRN

## 2018-11-30 MED ORDER — ONDANSETRON HCL 4 MG/2ML IJ SOLN
4.0000 mg | Freq: Once | INTRAMUSCULAR | Status: AC
  Administered 2018-11-30: 4 mg via INTRAVENOUS
  Filled 2018-11-30: qty 2

## 2018-11-30 MED ORDER — LORATADINE 10 MG PO TABS
10.0000 mg | ORAL_TABLET | Freq: Every day | ORAL | Status: DC
  Administered 2018-12-03 – 2018-12-04 (×2): 10 mg via ORAL
  Filled 2018-11-30 (×3): qty 1

## 2018-11-30 MED ORDER — HYDROXYUREA 500 MG PO CAPS
500.0000 mg | ORAL_CAPSULE | ORAL | Status: DC
  Administered 2018-12-01 – 2018-12-04 (×2): 500 mg via ORAL
  Filled 2018-11-30 (×3): qty 1

## 2018-11-30 MED ORDER — ACETAMINOPHEN 650 MG RE SUPP: 650.0000 mg | Freq: Four times a day (QID) | RECTAL | Status: DC | PRN

## 2018-11-30 MED ORDER — INSULIN DETEMIR 100 UNIT/ML ~~LOC~~ SOLN
8.0000 [IU] | Freq: Every day | SUBCUTANEOUS | Status: DC
  Administered 2018-11-30 – 2018-12-03 (×3): 8 [IU] via SUBCUTANEOUS
  Filled 2018-11-30 (×5): qty 0.08

## 2018-11-30 MED ORDER — CEFAZOLIN SODIUM-DEXTROSE 2-4 GM/100ML-% IV SOLN
2.0000 g | Freq: Once | INTRAVENOUS | Status: DC
  Administered 2018-12-01: 2 g via INTRAVENOUS
  Filled 2018-11-30: qty 100

## 2018-11-30 MED ORDER — INSULIN ASPART 100 UNIT/ML ~~LOC~~ SOLN
0.0000 [IU] | Freq: Three times a day (TID) | SUBCUTANEOUS | Status: DC
  Administered 2018-11-30: 3 [IU] via SUBCUTANEOUS
  Administered 2018-12-01: 1 [IU] via SUBCUTANEOUS
  Administered 2018-12-01 – 2018-12-04 (×7): 2 [IU] via SUBCUTANEOUS
  Filled 2018-11-30 (×9): qty 1

## 2018-11-30 MED ORDER — AMLODIPINE BESYLATE 5 MG PO TABS
2.5000 mg | ORAL_TABLET | Freq: Every day | ORAL | Status: DC
  Administered 2018-11-30: 2.5 mg via ORAL
  Filled 2018-11-30: qty 1

## 2018-11-30 NOTE — ED Notes (Signed)
After morphine given, Pt O2 saturation dropped to 86%. Pt placed on 4L O2 via nasal cannula. Saturation improved to 93%. Dr Cherylann Banas notified.

## 2018-11-30 NOTE — Progress Notes (Signed)
Patient ID: [name], female   DOB: [birth year], 85 y.o.   MRN: [id number]  ACP note  Patient present.  Tried to reach POA and left message on the phone.  Patient came in after a fall and found to have a left hip fracture.  Patient had quite a bit of pain in her left hip.  Left hip shortened and externally rotated.  Patient has a history of stroke and CAD hypertension and diabetes and hyperlipidemia and polycythemia vera.  Mortality is high within the first year of a hip fracture especially if they do not get up and walk again so that is why we recommend surgery regardless of medical issues. Patient is moderate risk for surgery.  CODE STATUS discussed and patient wishes to be a DO NOT RESUSCITATE.  Time spent on ACP discussion 17 minutes Dr Loletha Grayer

## 2018-11-30 NOTE — Consult Note (Signed)
ORTHOPAEDIC CONSULTATION  REQUESTING PHYSICIAN: Loletha Grayer, MD  Chief Complaint:   Left hip pain.  History of Present Illness: [name] is a 85 y.o. female with multiple medical problems including diabetes, coronary artery disease, history of TIA, hypertension, and several cancers who normally lives independently with 2 caretakers.  She was in her usual state of health this morning when she apparently turned to get something and felt that she could get to it without using her walker.  She lost her balance and fell, injuring her left hip.  She was brought to the emergency room where x-rays demonstrated a displaced left femoral neck fracture.  The patient denies any associated injury.  She did not strike her head or lose consciousness.  She denies any lightheadedness, dizziness, chest pain, shortness of breath, or other symptoms which may have precipitated her fall.  Past Medical History:  Diagnosis Date  . Adenocarcinoma in situ of cervix   . Arthritis    hands  . Breast cancer ([place])   . Colon cancer ([place])   . Diabetes mellitus without complication ([place])   . Endometrial carcinoma ([facility])    s/p total abdominal hysterectomy  . H/O compression fracture of spine 2014   thoracic spine  . H/O polycythemia vera   . H/O TIA (transient ischemic attack) and stroke 09/2014, 03/2015   No deficits  . Hypertension   . Hyperuricemia   . Microalbuminuria   . Myocardial infarction ([place])   . Polycythemia vera ([place])   . Recurrent falls   . Skin cancer    face, legs  . Stroke [facility]) 2008   no deficits  . Trochanteric bursitis   . Varicose veins    treated   Past Surgical History:  Procedure Laterality Date  . ABDOMINAL HYSTERECTOMY    . BOWEL RESECTION N/A 03/28/2015   Procedure: SMALL BOWEL RESECTION;  Surgeon: Leonie Green, MD;  Location: [facility] ORS;  Service: General;  Laterality: N/A;  . CATARACT EXTRACTION W/  INTRAOCULAR LENS IMPLANT Right   . CATARACT EXTRACTION W/PHACO Left 10/07/2015   Procedure: CATARACT EXTRACTION PHACO AND INTRAOCULAR LENS PLACEMENT (IOC);  Surgeon: Ronnell Freshwater, MD;  Location: [place];  Service: Ophthalmology;  Laterality: Left;  DIABETIC - oral meds VISION BLUE  . CORONARY ANGIOGRAPHY N/A 10/29/2017   Procedure: CORONARY ANGIOGRAPHY;  Surgeon: Dionisio David, MD;  Location: [place] [facility];  Service: Cardiovascular;  Laterality: N/A;  . EXPLORATORY LAPAROTOMY     for fibroids  . LEFT HEART CATH Right 10/29/2017   Procedure: Left Heart Cath;  Surgeon: Dionisio David, MD;  Location: [place] [facility];  Service: Cardiovascular;  Laterality: Right;  . TONSILLECTOMY     Social History   Socioeconomic History  . Marital status: Widowed    Spouse name: Not on file  . Number of children: Not on file  . Years of education: Not on file  . Highest education level: Not on file  Occupational History  . Not on file  Social Needs  . Financial resource strain: Not on file  . Food insecurity:    Worry: Not on file    Inability: Not on file  . Transportation needs:    Medical: Not on file    Non-medical: Not on file  Tobacco Use  . Smoking status: Former Research scientist (life sciences)  . Smokeless tobacco: Never Used  . Tobacco comment: quit 30+ yrs ago  Substance and Sexual Activity  . Alcohol use: No  .  Drug use: No  . Sexual activity: Not on file  Lifestyle  . Physical activity:    Days per week: Not on file    Minutes per session: Not on file  . Stress: Not on file  Relationships  . Social connections:    Talks on phone: Not on file    Gets together: Not on file    Attends religious service: Not on file    Active member of club or organization: Not on file    Attends meetings of clubs or organizations: Not on file    Relationship status: Not on file  Other Topics Concern  . Not on file  Social History Narrative  . Not on file   Family History   Problem Relation Age of Onset  . Cancer Brother        AML  . Heart attack Mother   . Breast cancer Mother   . Heart attack Father   . Diabetes Father   . Heart attack Sister   . Diabetes Sister    Allergies  Allergen Reactions  . Simvastatin     Other reaction(s): Muscle Pain   Prior to Admission medications   Medication Sig Start Date End Date Taking? Authorizing Provider  amLODipine (NORVASC) 2.5 MG tablet Take 1 tablet (2.5 mg total) by mouth daily. 11/17/17   Angiulli, Lavon Paganini, PA-C  Biotin 10 MG CAPS Take 1 tablet by mouth daily.    [provider]  blood glucose meter kit and supplies Dispense based on patient and insurance preference. Use up to four times daily as directed. (FOR ICD-10 E10.9, E11.9). 11/16/17   Angiulli, Lavon Paganini, PA-C  calcium-vitamin D (OSCAL WITH D) 500-200 MG-UNIT per tablet Take 1 tablet by mouth daily.     [provider]  carvedilol (COREG) 25 MG tablet Take 1 tablet (25 mg total) by mouth 2 (two) times daily with a meal. Patient taking differently: Take 12.5 mg by mouth 2 (two) times daily with a meal. Confirmed with Dr. Etta Quill nurse, patient is to only be taking 0.5 tab bid 11/16/17   Angiulli, Lavon Paganini, PA-C  clopidogrel (PLAVIX) 75 MG tablet Take 1 tablet (75 mg total) by mouth daily. 12/17/17   Venancio Poisson, NP  docusate sodium (COLACE) 250 MG capsule Take 250 mg by mouth daily as needed for constipation.    [provider]  fexofenadine (ALLEGRA ALLERGY) 180 MG tablet Take 1 tablet (180 mg total) by mouth daily. 12/17/17   Venancio Poisson, NP  hydroxyurea (HYDREA) 500 MG capsule Take 1 tablet daily except on Saturdays 11/16/17   Angiulli, Lavon Paganini, PA-C  insulin detemir (LEVEMIR) 100 unit/ml SOLN Inject 0.08 mLs (8 Units total) into the skin at bedtime. Patient taking differently: Inject 10 Units into the skin at bedtime.  11/17/17   Angiulli, Lavon Paganini, PA-C  nitroGLYCERIN (NITRODUR - DOSED IN MG/24 HR) 0.2 mg/hr  patch Place 1 patch (0.2 mg total) onto the skin daily. Patient taking differently: Place 0.1 mg onto the skin daily.  11/16/17   Angiulli, Lavon Paganini, PA-C  pantoprazole (PROTONIX) 40 MG tablet Take 1 tablet (40 mg total) by mouth daily. Patient not taking: Reported on 10/26/2018 11/17/17   Angiulli, Lavon Paganini, PA-C  pravastatin (PRAVACHOL) 20 MG tablet Take 1 tablet (20 mg total) by mouth at bedtime. 11/16/17   Angiulli, Lavon Paganini, PA-C  Probiotic Product (PROBIOTIC DAILY PO) Take 1 tablet by mouth daily.    [provider]  ramipril (ALTACE)  5 MG capsule Take 5 mg by mouth 2 (two) times daily.    [provider]  sertraline (ZOLOFT) 25 MG tablet Take 25 mg by mouth daily.    [provider]  spironolactone (ALDACTONE) 25 MG tablet Take 1 tablet (25 mg total) by mouth daily. Patient taking differently: Take 12.5 mg by mouth daily.  11/16/17   Angiulli, Lavon Paganini, PA-C   Ct Head Wo Contrast  Result Date: 11/30/2018 CLINICAL DATA:  Head trauma, mechanical fall, denies striking head, denies loss of consciousness, slurred speech, chronic, history stroke, hypertension, breast cancer, colon cancer, diabetes mellitus EXAM: CT HEAD WITHOUT CONTRAST CT CERVICAL SPINE WITHOUT CONTRAST TECHNIQUE: Multidetector CT imaging of the head and cervical spine was performed following the standard protocol without intravenous contrast. Multiplanar CT image reconstructions of the cervical spine were also generated. COMPARISON:  09/27/2018 Correlation: PET-CT 04/27/2018 FINDINGS: CT HEAD FINDINGS Brain: Generalized atrophy. Normal ventricular morphology. No midline shift or mass effect. Small vessel chronic ischemic changes of deep cerebral white matter. No intracranial hemorrhage, mass lesion, evidence of acute infarction, or extra-axial fluid collection. Vascular: No hyperdense vessels. Minimal atherosclerotic calcification of internal carotid arteries at skull base Skull: Intact Sinuses/Orbits: Partial  opacification of ethmoid air cells and LEFT mastoid air cells. Remaining visualized paranasal sinuses and RIGHT mastoid air cells clear Other: N/A CT CERVICAL SPINE FINDINGS Alignment: Normal Skull base and vertebrae: Osseous demineralization. Skull base intact. Multilevel facet degenerative changes. Degenerative disc disease changes of the caudal cervical spine. Significant endplate spur formation and uncovertebral spurring at C5-C6 and C6-C7 narrow the LEFT neural foramina. No fracture, subluxation or bone destruction. Soft tissues and spinal canal: Prevertebral soft tissues normal thickness Disc levels:  No significant abnormalities Upper chest: 9 mm focus of ground-glass opacity at LEFT apex unchanged since 04/27/2018. Remaining apices clear. Other: N/A IMPRESSION: Atrophy with small vessel chronic ischemic changes of deep cerebral white matter. No acute intracranial abnormalities. Degenerative disc and facet disease changes of the cervical spine. No acute cervical spine abnormalities. 9 mm focus of ground-glass opacity at LEFT apex, stable since 04/27/2018, when it demonstrated no worrisome FDG accumulation. Electronically Signed   By: Lavonia Dana M.D.   On: 11/30/2018 13:07   Ct Cervical Spine Wo Contrast  Result Date: 11/30/2018 CLINICAL DATA:  Head trauma, mechanical fall, denies striking head, denies loss of consciousness, slurred speech, chronic, history stroke, hypertension, breast cancer, colon cancer, diabetes mellitus EXAM: CT HEAD WITHOUT CONTRAST CT CERVICAL SPINE WITHOUT CONTRAST TECHNIQUE: Multidetector CT imaging of the head and cervical spine was performed following the standard protocol without intravenous contrast. Multiplanar CT image reconstructions of the cervical spine were also generated. COMPARISON:  09/27/2018 Correlation: PET-CT 04/27/2018 FINDINGS: CT HEAD FINDINGS Brain: Generalized atrophy. Normal ventricular morphology. No midline shift or mass effect. Small vessel chronic  ischemic changes of deep cerebral white matter. No intracranial hemorrhage, mass lesion, evidence of acute infarction, or extra-axial fluid collection. Vascular: No hyperdense vessels. Minimal atherosclerotic calcification of internal carotid arteries at skull base Skull: Intact Sinuses/Orbits: Partial opacification of ethmoid air cells and LEFT mastoid air cells. Remaining visualized paranasal sinuses and RIGHT mastoid air cells clear Other: N/A CT CERVICAL SPINE FINDINGS Alignment: Normal Skull base and vertebrae: Osseous demineralization. Skull base intact. Multilevel facet degenerative changes. Degenerative disc disease changes of the caudal cervical spine. Significant endplate spur formation and uncovertebral spurring at C5-C6 and C6-C7 narrow the LEFT neural foramina. No fracture, subluxation or bone destruction. Soft tissues and spinal canal: Prevertebral soft  tissues normal thickness Disc levels:  No significant abnormalities Upper chest: 9 mm focus of ground-glass opacity at LEFT apex unchanged since 04/27/2018. Remaining apices clear. Other: N/A IMPRESSION: Atrophy with small vessel chronic ischemic changes of deep cerebral white matter. No acute intracranial abnormalities. Degenerative disc and facet disease changes of the cervical spine. No acute cervical spine abnormalities. 9 mm focus of ground-glass opacity at LEFT apex, stable since 04/27/2018, when it demonstrated no worrisome FDG accumulation. Electronically Signed   By: Lavonia Dana M.D.   On: 11/30/2018 13:07   Dg Shoulder Left  Result Date: 11/30/2018 CLINICAL DATA:  Pain after fall EXAM: LEFT SHOULDER - 2+ VIEW COMPARISON:  September 27, 2018 FINDINGS: Again noted is an impacted fracture of the proximal left humerus through the neck and greater tuberosity. There is incomplete associated callus formation. No other fractures noted. No dislocation identified. IMPRESSION: Again noted is an impacted fracture through the left humeral neck and greater  tubercle. There is associated callus formation although incomplete. The displacement is mildly increased compared to the April 2020 study but there appears to be bridging callus formation which would argue against an acute on chronic fracture. Recommend clinical correlation. Electronically Signed   By: Dorise Bullion III M.D   On: 11/30/2018 12:37   Dg Hip Unilat With Pelvis 2-3 Views Left  Result Date: 11/30/2018 CLINICAL DATA:  Left hip pain after fall. EXAM: DG HIP (WITH OR WITHOUT PELVIS) 2-3V LEFT COMPARISON:  None. FINDINGS: There is a displaced subcapital fracture through the proximal left femur. The lateral view is limited but there is no convincing evidence of dislocation based on the frontal views. The right hip is intact. The pelvic bones are otherwise unremarkable. IMPRESSION: Displaced subcapital fracture in the proximal left femur/hip as above. Electronically Signed   By: Dorise Bullion III M.D   On: 11/30/2018 12:34    Positive ROS: All other systems have been reviewed and were otherwise negative with the exception of those mentioned in the HPI and as above.  Physical Exam: General:  Alert, no acute distress Psychiatric:  Patient is competent for consent with normal mood and affect   Cardiovascular:  No pedal edema Respiratory:  No wheezing, non-labored breathing GI:  Abdomen is soft and non-tender Skin:  No lesions in the area of chief complaint Neurologic:  Sensation intact distally Lymphatic:  No axillary or cervical lymphadenopathy  Orthopedic Exam:  Orthopedic examination is limited to the left hip and lower extremity.  The left lower extremity is somewhat shortened and externally rotated as compared to the right.  Skin inspection around the left hip is unremarkable.  No swelling, erythema, ecchymosis, abrasions, or other skin abnormalities are identified.  She has only mild tenderness to palpation over the lateral aspect of the left hip.  She has more severe pain with any  attempted active or passive motion of the left hip.  She is able to actively dorsiflex and plantarflex her toes and ankle on the left.  Sensation is intact light touch to all distributions.  She has good capillary refill to her left foot.  X-rays:  X-rays of the pelvis and left hip are available for review.  These films demonstrate a varus displaced subcapital femoral neck fracture.  No significant degenerative changes are noted.  No lytic lesions are identified.  Assessment: Displaced left femoral neck fracture.  Plan: The treatment options have been discussed with the patient, including both surgical and nonsurgical choices.  The patient like to proceed with  surgical intervention to include a left hip hemiarthroplasty.  This procedure has been discussed in detail, as have the potential risks (including bleeding, infection, nerve and/or blood vessel injury, persistent or recurrent pain, stiffness, leg length inequality, dislocation, loosening of and/or failure of the components, need for further surgery, blood clots, strokes, heart attacks and arrhythmias, etc.) and benefits.  The patient states her understanding and wishes to proceed.  A formal written consent will be obtained by the nursing staff.  Thank you for asking me to participate in the care of this most pleasant yet unfortunate woman.  We will be happy to follow her with you.   Pascal Lux, MD  Beeper #:  [phone]  11/30/2018 6:35 PM

## 2018-11-30 NOTE — Progress Notes (Signed)
Patient ID: [name], female   DOB: 12-Jan-1932, 85 y.o.   MRN: [id number]  Was message by orthopedic surgery that anesthesia wanted cardiology clearance prior to going to the operating room.  The patient has no contraindications to surgery at this time.  I would not do any further testing.  All hip fracture should go to the operating room within 12 to 24 hours of fracturing their hip regardless of their medical problems.  Because of anesthesia wanting cardiac clearance I spoke with Dr. Ubaldo Glassing cardiology to see the patient.  Dr Loletha Grayer

## 2018-11-30 NOTE — ED Triage Notes (Signed)
Pt arrives to ED from home via [facility] with c/c of mechanical fall. Pt complaining of pain to left shoulder and left hip. Denies hitting head or LoC. Pt has Hx of stroke, HTN. Pt speech slurred but caregiver stated this is baseline for her. Transport vitals reported as 212/93, p71, CBG 203, O2 sat 96% on room air. Pt given 50 mcg fentanyl, 20G placed in left AC. Upon arrival, pt in obvious distress. A&Ox4 but difficult to understand due to pain levels and slurred speech. No respiratory distress evident.

## 2018-11-30 NOTE — ED Provider Notes (Signed)
[facility] Emergency Department Provider Note ____________________________________________   First MD Initiated Contact with Patient 11/30/18 1403     (approximate)  I have reviewed the triage vital signs and the nursing notes.   HISTORY  Chief Complaint Fall; Shoulder Pain; and Hip Pain  Level 5 caveat: History of present illness limited due to dysarthria  HPI [name] is a 85 y.o. female with PMH as noted below who presents after mechanical fall from standing height with left hip and left shoulder pain.  The patient initially stated that she did not hit her head but then states that she was not sure.  She denies headache or neck pain acutely.  Past Medical History:  Diagnosis Date  . Adenocarcinoma in situ of cervix   . Arthritis    hands  . Breast cancer ([place])   . Colon cancer ([place])   . Diabetes mellitus without complication ([place])   . Endometrial carcinoma (HCC)    s/p total abdominal hysterectomy  . H/O compression fracture of spine 2014   thoracic spine  . H/O polycythemia vera   . H/O TIA (transient ischemic attack) and stroke 09/2014, 03/2015   No deficits  . Hypertension   . Hyperuricemia   . Microalbuminuria   . Myocardial infarction ([place])   . Polycythemia vera ([place])   . Recurrent falls   . Skin cancer    face, legs  . Stroke [facility]) 2008   no deficits  . Trochanteric bursitis   . Varicose veins    treated    Patient Active Problem List   Diagnosis Date Noted  . Closed left hip fracture ([place]) 11/30/2018  . Liver lesion 04/22/2018  . Goals of care, counseling/discussion 04/22/2018  . Cognitive deficit, post-stroke 12/31/2017  . Ataxia, post-stroke 12/31/2017  . Dysarthria, post-stroke   . Gait disturbance, post-stroke   . H/O cerebral parenchymal hemorrhage 11/04/2017  . Essential hypertension 11/04/2017  . Hyperlipidemia 11/04/2017  . Diabetes ([place]) 11/04/2017  . CAD (coronary artery disease) 11/04/2017  . Aortic  arch aneurysm ([place]) 11/04/2017  . Hypokalemia 11/04/2017  . 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 History of cervical cancer   . History of TIA (transient ischemic attack)   . Acute systolic congestive heart failure ([place])   . Reactive hypertension   . Hypernatremia   . Leukocytosis   . Acute blood loss anemia   . Elevated serum creatinine   . Acute respiratory failure with hypoxia ([place])   . IVH (intraventricular hemorrhage) ([place]) 10/29/2017  . Hypoxia 10/28/2017  . Pancreatic lesion 05/24/2017  . 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 Nodule of upper lobe of left lung 06/04/2015  . 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 Cerebral thrombosis with cerebral infarction 04/03/2015  . Cancer of right colon ([place]) 03/28/2015  . CVA (cerebral infarction) 12/21/2014  . Polycythemia vera ([place]) 06/22/2006    Past Surgical History:  Procedure Laterality Date  . ABDOMINAL HYSTERECTOMY    . BOWEL RESECTION N/A 03/28/2015   Procedure: SMALL BOWEL RESECTION;  Surgeon: Leonie Green, MD;  Location: [facility] ORS;  Service: General;  Laterality: N/A;  . CATARACT EXTRACTION W/ INTRAOCULAR LENS IMPLANT Right   . CATARACT EXTRACTION W/PHACO Left 10/07/2015   Procedure: CATARACT EXTRACTION PHACO AND INTRAOCULAR LENS PLACEMENT (IOC);  Surgeon: Ronnell Freshwater, MD;  Location: [place];  Service: Ophthalmology;  Laterality: Left;  DIABETIC - oral meds VISION BLUE  .  CORONARY ANGIOGRAPHY N/A 10/29/2017   Procedure: CORONARY ANGIOGRAPHY;  Surgeon: Dionisio David, MD;  Location: [place] [facility];  Service: Cardiovascular;  Laterality: N/A;  . EXPLORATORY LAPAROTOMY     for fibroids  . LEFT HEART CATH Right 10/29/2017   Procedure: Left Heart Cath;  Surgeon: Dionisio David, MD;  Location: [place] [facility];  Service: Cardiovascular;  Laterality: Right;  . TONSILLECTOMY      Prior to Admission medications    Medication Sig Start Date End Date Taking? Authorizing Provider  amLODipine (NORVASC) 2.5 MG tablet Take 1 tablet (2.5 mg total) by mouth daily. 11/17/17   Angiulli, Lavon Paganini, PA-C  Biotin 10 MG CAPS Take 1 tablet by mouth daily.    [provider]  blood glucose meter kit and supplies Dispense based on patient and insurance preference. Use up to four times daily as directed. (FOR ICD-10 E10.9, E11.9). 11/16/17   Angiulli, Lavon Paganini, PA-C  calcium-vitamin D (OSCAL WITH D) 500-200 MG-UNIT per tablet Take 1 tablet by mouth daily.     [provider]  carvedilol (COREG) 25 MG tablet Take 1 tablet (25 mg total) by mouth 2 (two) times daily with a meal. Patient taking differently: Take 12.5 mg by mouth 2 (two) times daily with a meal. Confirmed with Dr. Etta Quill nurse, patient is to only be taking 0.5 tab bid 11/16/17   Angiulli, Lavon Paganini, PA-C  clopidogrel (PLAVIX) 75 MG tablet Take 1 tablet (75 mg total) by mouth daily. 12/17/17   Venancio Poisson, NP  docusate sodium (COLACE) 250 MG capsule Take 250 mg by mouth daily as needed for constipation.    [provider]  fexofenadine (ALLEGRA ALLERGY) 180 MG tablet Take 1 tablet (180 mg total) by mouth daily. 12/17/17   Venancio Poisson, NP  hydroxyurea (HYDREA) 500 MG capsule Take 1 tablet daily except on Saturdays 11/16/17   Angiulli, Lavon Paganini, PA-C  insulin detemir (LEVEMIR) 100 unit/ml SOLN Inject 0.08 mLs (8 Units total) into the skin at bedtime. Patient taking differently: Inject 10 Units into the skin at bedtime.  11/17/17   Angiulli, Lavon Paganini, PA-C  nitroGLYCERIN (NITRODUR - DOSED IN MG/24 HR) 0.2 mg/hr patch Place 1 patch (0.2 mg total) onto the skin daily. Patient taking differently: Place 0.1 mg onto the skin daily.  11/16/17   Angiulli, Lavon Paganini, PA-C  pantoprazole (PROTONIX) 40 MG tablet Take 1 tablet (40 mg total) by mouth daily. Patient not taking: Reported on 10/26/2018 11/17/17   Angiulli, Lavon Paganini, PA-C  pravastatin  (PRAVACHOL) 20 MG tablet Take 1 tablet (20 mg total) by mouth at bedtime. 11/16/17   Angiulli, Lavon Paganini, PA-C  Probiotic Product (PROBIOTIC DAILY PO) Take 1 tablet by mouth daily.    [provider]  ramipril (ALTACE) 5 MG capsule Take 5 mg by mouth 2 (two) times daily.    [provider]  sertraline (ZOLOFT) 25 MG tablet Take 25 mg by mouth daily.    [provider]  spironolactone (ALDACTONE) 25 MG tablet Take 1 tablet (25 mg total) by mouth daily. Patient taking differently: Take 12.5 mg by mouth daily.  11/16/17   Angiulli, Lavon Paganini, PA-C    Allergies Simvastatin  Family History  Problem Relation Age of Onset  . Cancer Brother        AML  . Heart attack Mother   . Breast cancer Mother   . Heart attack Father   . Diabetes Father   . Heart attack  Sister   . Diabetes Sister     Social History Social History   Tobacco Use  . Smoking status: Former Research scientist (life sciences)  . Smokeless tobacco: Never Used  . Tobacco comment: quit 30+ yrs ago  Substance Use Topics  . Alcohol use: No  . Drug use: No    Review of Systems Level 5 caveat: Unable to obtain review of systems due to dysarthria   ____________________________________________   PHYSICAL EXAM:  VITAL SIGNS: ED Triage Vitals  Enc Vitals Group     BP 11/30/18 1110 (!) 214/92     Pulse Rate 11/30/18 1110 71     Resp 11/30/18 1110 (!) 21     Temp --      Temp src --      SpO2 11/30/18 1110 94 %     Weight 11/30/18 1111 106 lb (48.1 kg)     Height 11/30/18 1111 5' 5" (1.651 m)     Head Circumference --      Peak Flow --      Pain Score --      Pain Loc --      Pain Edu? --      Excl. in [place]? --     Constitutional: Alert and oriented.  Frail and uncomfortable appearing. Eyes: Conjunctivae are normal.  EOMI. Head: Atraumatic. Nose: No congestion/rhinnorhea. Mouth/Throat: Mucous membranes are moist.   Neck: Normal range of motion.  No midline cervical spinal tenderness. Cardiovascular: Normal  rate, regular rhythm. Grossly normal heart sounds.  Good peripheral circulation. Respiratory: Normal respiratory effort.  No retractions. Lungs CTAB. Gastrointestinal: Soft and nontender. No distention.  Genitourinary: No flank tenderness. Musculoskeletal: No lower extremity edema.  Extremities warm and well perfused.  Pain on range of motion of left hip, and anterior left hip tenderness with no deformity.  Left lower extremity is shortened.  Pain on range of motion of left shoulder.  No midline thoracic or lumbar spinal tenderness.  2+ pulses distally in all extremities. Neurologic: Dysarthric speech, baseline for patient.  Motor intact in all extremities. Skin:  Skin is warm and dry. No rash noted. Psychiatric: Anxious appearing.  ____________________________________________   LABS (all labs ordered are listed, but only abnormal results are displayed)  Labs Reviewed  BASIC METABOLIC PANEL - Abnormal; Notable for the following components:      Result Value   Glucose, Bld 224 (*)    BUN 32 (*)    Creatinine, Ser 1.07 (*)    GFR calc non Af Amer 48 (*)    GFR calc Af Amer 55 (*)    All other components within normal limits  CBC WITH DIFFERENTIAL/PLATELET - Abnormal; Notable for the following components:   WBC 15.8 (*)    RDW 15.7 (*)    Platelets 445 (*)    Neutro Abs 14.2 (*)    Lymphs Abs 0.6 (*)    Abs Immature Granulocytes 0.17 (*)    All other components within normal limits  SARS CORONAVIRUS 2 (HOSPITAL ORDER, [place] [facility])  PROTIME-INR  URINALYSIS, COMPLETE (UACMP) WITH MICROSCOPIC  HEMOGLOBIN A1C  ABO/RH   ____________________________________________  EKG  ED ECG REPORT I, Arta Silence, the attending physician, personally viewed and interpreted this ECG.  Date: 11/30/2018 EKG Time: 1112 Rate: 66 Rhythm: normal sinus rhythm QRS Axis: normal Intervals: LBBB ST/T Wave abnormalities: normal Narrative Interpretation: no evidence of  acute ischemia  ____________________________________________  RADIOLOGY  XR L hip: Subcapital femoral fracture XR L shoulder: Chronic  appearing humeral fracture CT head: No ICH CT cervical spine: No acute fracture  ____________________________________________   PROCEDURES  Procedure(s) performed: No  Procedures  Critical Care performed: No ____________________________________________   INITIAL IMPRESSION / ASSESSMENT AND PLAN / ED COURSE  Pertinent labs & imaging results that were available during my care of the patient were reviewed by me and considered in my medical decision making (see chart for details).  85 year old female with PMH as noted above presents with left hip and left shoulder pain after a mechanical fall from standing height.  She is unsure if she hit her head.  On exam the patient is frail appearing and she is in significant pain.  Her vital signs are normal except for hypertension.  The patient is likely baseline hypertensive and I suspect that this is primarily being aggravated by her pain.  The remainder of the exam is as described above with pain on any attempt at range of motion of the left hip.  Neuro exam demonstrates known dysarthria but no acute focal abnormalities.  We will obtain x-rays of the relevant areas as well as CT and cervical spine since the patient is not completely sure whether she hit her head.  I will obtain lab work-up as well.  If the blood pressure remains significantly elevated after adequate analgesia we will consider antihypertensive.  ----------------------------------------- 4:10 PM on 11/30/2018 -----------------------------------------  X-ray reveals subcapital femoral fracture.  I consulted Dr. Roland Rack from orthopedics who will evaluate the patient.  I admitted the patient to the hospitalist Dr. Leslye Peer.  ____________________________________________   FINAL CLINICAL IMPRESSION(S) / ED DIAGNOSES  Final diagnoses:  Closed  subcapital fracture of left femur, initial encounter ([place])      NEW MEDICATIONS STARTED DURING THIS VISIT:  Current Discharge Medication List       Note:  This document was prepared using Dragon voice recognition software and may include unintentional dictation errors.    Arta Silence, MD 11/30/18 [phone]

## 2018-11-30 NOTE — ED Notes (Signed)
ED TO INPATIENT HANDOFF REPORT  ED Nurse Name and Phone #:  [phone]  S Name/Age/Gender [name] 85 y.o. female Room/Bed: ED12A/ED12A  Code Status   Code Status: DNR  Home/SNF/Other Home Patient oriented to: self, place, time and situation Is this baseline? Yes   Triage Complete: Triage complete  Chief Complaint hip injury  Triage Note Pt arrives to ED from home via [facility] with c/c of mechanical fall. Pt complaining of pain to left shoulder and left hip. Denies hitting head or LoC. Pt has Hx of stroke, HTN. Pt speech slurred but caregiver stated this is baseline for her. Transport vitals reported as 212/93, p71, CBG 203, O2 sat 96% on room air. Pt given 50 mcg fentanyl, 20G placed in left AC. Upon arrival, pt in obvious distress. A&Ox4 but difficult to understand due to pain levels and slurred speech. No respiratory distress evident.   Allergies Allergies  Allergen Reactions  . Simvastatin     Other reaction(s): Muscle Pain    Level of Care/Admitting Diagnosis ED Disposition    ED Disposition Condition [facility]: [place] [[id number]]  Level of Care: Med-Surg [16]  Covid Evaluation: Screening Protocol (No Symptoms)  Diagnosis: Closed left hip fracture [facility]) [[id number]]  Admitting Physician: Loletha Grayer [[id number]]  Attending Physician: Loletha Grayer [phone]  Estimated length of stay: past midnight tomorrow  Certification:: I certify this patient will need inpatient services for at least 2 midnights  PT Class (Do Not Modify): Inpatient [101]  PT Acc Code (Do Not Modify): Private [1]       B Medical/Surgery History Past Medical History:  Diagnosis Date  . Adenocarcinoma in situ of cervix   . Arthritis    hands  . Breast cancer ([place])   . Colon cancer ([place])   . Diabetes mellitus without complication ([place])   . Endometrial carcinoma ([facility])    s/p total abdominal hysterectomy  . H/O compression fracture of spine 2014    thoracic spine  . H/O polycythemia vera   . H/O TIA (transient ischemic attack) and stroke 09/2014, 03/2015   No deficits  . Hypertension   . Hyperuricemia   . Microalbuminuria   . Myocardial infarction ([place])   . Polycythemia vera ([place])   . Recurrent falls   . Skin cancer    face, legs  . Stroke [facility]) 2008   no deficits  . Trochanteric bursitis   . Varicose veins    treated   Past Surgical History:  Procedure Laterality Date  . ABDOMINAL HYSTERECTOMY    . BOWEL RESECTION N/A 03/28/2015   Procedure: SMALL BOWEL RESECTION;  Surgeon: Leonie Green, MD;  Location: [facility] ORS;  Service: General;  Laterality: N/A;  . CATARACT EXTRACTION W/ INTRAOCULAR LENS IMPLANT Right   . CATARACT EXTRACTION W/PHACO Left 10/07/2015   Procedure: CATARACT EXTRACTION PHACO AND INTRAOCULAR LENS PLACEMENT (IOC);  Surgeon: Ronnell Freshwater, MD;  Location: [place];  Service: Ophthalmology;  Laterality: Left;  DIABETIC - oral meds VISION BLUE  . CORONARY ANGIOGRAPHY N/A 10/29/2017   Procedure: CORONARY ANGIOGRAPHY;  Surgeon: Dionisio David, MD;  Location: [place] CV LAB;  Service: Cardiovascular;  Laterality: N/A;  . EXPLORATORY LAPAROTOMY     for fibroids  . LEFT HEART CATH Right 10/29/2017   Procedure: Left Heart Cath;  Surgeon: Dionisio David, MD;  Location: [place] CV LAB;  Service: Cardiovascular;  Laterality: Right;  . TONSILLECTOMY  A IV Location/Drains/Wounds Patient Lines/Drains/Airways Status   Active Line/Drains/Airways    Name:   Placement date:   Placement time:   Site:   Days:   Incision (Closed) 10/07/15 Eye Left   10/07/15    0916     1150          Intake/Output Last 24 hours No intake or output data in the 24 hours ending 11/30/18 1536  Labs/Imaging Results for orders placed or performed during the hospital encounter of 11/30/18 (from the past 48 hour(s))  Basic metabolic panel     Status: Abnormal   Collection Time: 11/30/18  1:14 PM   Result Value Ref Range   Sodium 137 135 - 145 mmol/L   Potassium 4.3 3.5 - 5.1 mmol/L   Chloride 103 98 - 111 mmol/L   CO2 25 22 - 32 mmol/L   Glucose, Bld 224 (H) 70 - 99 mg/dL   BUN 32 (H) 8 - 23 mg/dL   Creatinine, Ser 1.07 (H) 0.44 - 1.00 mg/dL   Calcium 8.9 8.9 - 10.3 mg/dL   GFR calc non Af Amer 48 (L) >60 mL/min   GFR calc Af Amer 55 (L) >60 mL/min   Anion gap 9 5 - 15    Comment: Performed at [facility], [address] [place], [place] [address]  CBC with Differential     Status: Abnormal   Collection Time: 11/30/18  1:14 PM  Result Value Ref Range   WBC 15.8 (H) 4.0 - 10.5 K/uL   RBC 4.70 3.87 - 5.11 MIL/uL   Hemoglobin 14.3 12.0 - 15.0 g/dL   HCT 44.5 36.0 - 46.0 %   MCV 94.7 80.0 - 100.0 fL   MCH 30.4 26.0 - 34.0 pg   MCHC 32.1 30.0 - 36.0 g/dL   RDW 15.7 (H) 11.5 - 15.5 %   Platelets 445 (H) 150 - 400 K/uL   nRBC 0.0 0.0 - 0.2 %   Neutrophils Relative % 89 %   Neutro Abs 14.2 (H) 1.7 - 7.7 K/uL   Lymphocytes Relative 4 %   Lymphs Abs 0.6 (L) 0.7 - 4.0 K/uL   Monocytes Relative 4 %   Monocytes Absolute 0.6 0.1 - 1.0 K/uL   Eosinophils Relative 1 %   Eosinophils Absolute 0.1 0.0 - 0.5 K/uL   Basophils Relative 1 %   Basophils Absolute 0.1 0.0 - 0.1 K/uL   Immature Granulocytes 1 %   Abs Immature Granulocytes 0.17 (H) 0.00 - 0.07 K/uL    Comment: Performed at [facility], [address] [place], [place] [address]  Protime-INR     Status: None   Collection Time: 11/30/18  1:14 PM  Result Value Ref Range   Prothrombin Time 14.8 11.4 - 15.2 seconds   INR 1.2 0.8 - 1.2    Comment: (NOTE) INR goal varies based on device and disease states. Performed at [facility], [address] [place], [place] [address]    Ct Head Wo Contrast  Result Date: 11/30/2018 CLINICAL DATA:  Head trauma, mechanical fall, denies striking head, denies loss of consciousness, slurred speech, chronic, history stroke, hypertension, breast cancer,  colon cancer, diabetes mellitus EXAM: CT HEAD WITHOUT CONTRAST CT CERVICAL SPINE WITHOUT CONTRAST TECHNIQUE: Multidetector CT imaging of the head and cervical spine was performed following the standard protocol without intravenous contrast. Multiplanar CT image reconstructions of the cervical spine were also generated. COMPARISON:  09/27/2018 Correlation: PET-CT 04/27/2018 FINDINGS: CT HEAD FINDINGS Brain: Generalized atrophy.  Normal ventricular morphology. No midline shift or mass effect. Small vessel chronic ischemic changes of deep cerebral white matter. No intracranial hemorrhage, mass lesion, evidence of acute infarction, or extra-axial fluid collection. Vascular: No hyperdense vessels. Minimal atherosclerotic calcification of internal carotid arteries at skull base Skull: Intact Sinuses/Orbits: Partial opacification of ethmoid air cells and LEFT mastoid air cells. Remaining visualized paranasal sinuses and RIGHT mastoid air cells clear Other: N/A CT CERVICAL SPINE FINDINGS Alignment: Normal Skull base and vertebrae: Osseous demineralization. Skull base intact. Multilevel facet degenerative changes. Degenerative disc disease changes of the caudal cervical spine. Significant endplate spur formation and uncovertebral spurring at C5-C6 and C6-C7 narrow the LEFT neural foramina. No fracture, subluxation or bone destruction. Soft tissues and spinal canal: Prevertebral soft tissues normal thickness Disc levels:  No significant abnormalities Upper chest: 9 mm focus of ground-glass opacity at LEFT apex unchanged since 04/27/2018. Remaining apices clear. Other: N/A IMPRESSION: Atrophy with small vessel chronic ischemic changes of deep cerebral white matter. No acute intracranial abnormalities. Degenerative disc and facet disease changes of the cervical spine. No acute cervical spine abnormalities. 9 mm focus of ground-glass opacity at LEFT apex, stable since 04/27/2018, when it demonstrated no worrisome FDG  accumulation. Electronically Signed   By: Lavonia Dana M.D.   On: 11/30/2018 13:07   Ct Cervical Spine Wo Contrast  Result Date: 11/30/2018 CLINICAL DATA:  Head trauma, mechanical fall, denies striking head, denies loss of consciousness, slurred speech, chronic, history stroke, hypertension, breast cancer, colon cancer, diabetes mellitus EXAM: CT HEAD WITHOUT CONTRAST CT CERVICAL SPINE WITHOUT CONTRAST TECHNIQUE: Multidetector CT imaging of the head and cervical spine was performed following the standard protocol without intravenous contrast. Multiplanar CT image reconstructions of the cervical spine were also generated. COMPARISON:  09/27/2018 Correlation: PET-CT 04/27/2018 FINDINGS: CT HEAD FINDINGS Brain: Generalized atrophy. Normal ventricular morphology. No midline shift or mass effect. Small vessel chronic ischemic changes of deep cerebral white matter. No intracranial hemorrhage, mass lesion, evidence of acute infarction, or extra-axial fluid collection. Vascular: No hyperdense vessels. Minimal atherosclerotic calcification of internal carotid arteries at skull base Skull: Intact Sinuses/Orbits: Partial opacification of ethmoid air cells and LEFT mastoid air cells. Remaining visualized paranasal sinuses and RIGHT mastoid air cells clear Other: N/A CT CERVICAL SPINE FINDINGS Alignment: Normal Skull base and vertebrae: Osseous demineralization. Skull base intact. Multilevel facet degenerative changes. Degenerative disc disease changes of the caudal cervical spine. Significant endplate spur formation and uncovertebral spurring at C5-C6 and C6-C7 narrow the LEFT neural foramina. No fracture, subluxation or bone destruction. Soft tissues and spinal canal: Prevertebral soft tissues normal thickness Disc levels:  No significant abnormalities Upper chest: 9 mm focus of ground-glass opacity at LEFT apex unchanged since 04/27/2018. Remaining apices clear. Other: N/A IMPRESSION: Atrophy with small vessel chronic  ischemic changes of deep cerebral white matter. No acute intracranial abnormalities. Degenerative disc and facet disease changes of the cervical spine. No acute cervical spine abnormalities. 9 mm focus of ground-glass opacity at LEFT apex, stable since 04/27/2018, when it demonstrated no worrisome FDG accumulation. Electronically Signed   By: Lavonia Dana M.D.   On: 11/30/2018 13:07   Dg Shoulder Left  Result Date: 11/30/2018 CLINICAL DATA:  Pain after fall EXAM: LEFT SHOULDER - 2+ VIEW COMPARISON:  September 27, 2018 FINDINGS: Again noted is an impacted fracture of the proximal left humerus through the neck and greater tuberosity. There is incomplete associated callus formation. No other fractures noted. No dislocation identified. IMPRESSION: Again noted is an impacted fracture  through the left humeral neck and greater tubercle. There is associated callus formation although incomplete. The displacement is mildly increased compared to the April 2020 study but there appears to be bridging callus formation which would argue against an acute on chronic fracture. Recommend clinical correlation. Electronically Signed   By: Dorise Bullion III M.D   On: 11/30/2018 12:37   Dg Hip Unilat With Pelvis 2-3 Views Left  Result Date: 11/30/2018 CLINICAL DATA:  Left hip pain after fall. EXAM: DG HIP (WITH OR WITHOUT PELVIS) 2-3V LEFT COMPARISON:  None. FINDINGS: There is a displaced subcapital fracture through the proximal left femur. The lateral view is limited but there is no convincing evidence of dislocation based on the frontal views. The right hip is intact. The pelvic bones are otherwise unremarkable. IMPRESSION: Displaced subcapital fracture in the proximal left femur/hip as above. Electronically Signed   By: Dorise Bullion III M.D   On: 11/30/2018 12:34    Pending Labs Unresulted Labs (From admission, onward)    Start     Ordered   12/01/18 5974  Basic metabolic panel  Tomorrow morning,   STAT     11/30/18 1438    12/01/18 0500  CBC  Tomorrow morning,   STAT     11/30/18 1438   11/30/18 1439  Hemoglobin A1c  Add-on,   AD     11/30/18 1438   11/30/18 1407  SARS Coronavirus 2 [facility] order, Performed in [facility] hospital lab)  (Novel Coronavirus, [facility] Order))  ONCE - STAT,   STAT    Question:  Patient immune status  Answer:  Normal   11/30/18 1406   11/30/18 1407  ABO/Rh  Once,   STAT     11/30/18 1406   11/30/18 1227  Urinalysis, Complete w Microscopic  ONCE - STAT,   STAT     11/30/18 1226          Vitals/Pain Today's Vitals   11/30/18 1330 11/30/18 1400 11/30/18 1430 11/30/18 1500  BP: (!) 180/93 (!) 171/87 (!) 168/111 (!) 173/95  Pulse: 81 86 86 89  Resp: (!) 25 (!) 24 19 (!) 26  SpO2: 92% 94% 94% 100%  Weight:      Height:        Isolation Precautions Droplet and Contact precautions  Medications Medications  ceFAZolin (ANCEF) IVPB 2g/100 mL premix (has no administration in time range)  hydroxyurea (HYDREA) capsule 500 mg (has no administration in time range)  amLODipine (NORVASC) tablet 2.5 mg (has no administration in time range)  carvedilol (COREG) tablet 12.5 mg (has no administration in time range)  nitroGLYCERIN (NITRODUR - Dosed in mg/24 hr) patch 0.1 mg (has no administration in time range)  pravastatin (PRAVACHOL) tablet 20 mg (has no administration in time range)  ramipril (ALTACE) capsule 5 mg (has no administration in time range)  spironolactone (ALDACTONE) tablet 12.5 mg (has no administration in time range)  sertraline (ZOLOFT) tablet 25 mg (has no administration in time range)  insulin detemir (LEVEMIR) FlexPen 8 Units (has no administration in time range)  docusate sodium (COLACE) capsule 200 mg (has no administration in time range)  pantoprazole (PROTONIX) EC tablet 40 mg (has no administration in time range)  Probiotic Daily CAPS 1 capsule (has no administration in time range)  calcium-vitamin D (OSCAL WITH D) 500-200 MG-UNIT per tablet 1 tablet  (has no administration in time range)  loratadine (CLARITIN) tablet 10 mg (has no administration in time range)  acetaminophen (TYLENOL) tablet 650  mg (has no administration in time range)    Or  acetaminophen (TYLENOL) suppository 650 mg (has no administration in time range)  oxyCODONE (Oxy IR/ROXICODONE) immediate release tablet 5 mg (has no administration in time range)  morphine 2 MG/ML injection 1 mg (has no administration in time range)  insulin aspart (novoLOG) injection 0-9 Units (has no administration in time range)  insulin aspart (novoLOG) injection 0-5 Units (has no administration in time range)  morphine 4 MG/ML injection 4 mg (4 mg Intravenous Given 11/30/18 1303)  ondansetron (ZOFRAN) injection 4 mg (4 mg Intravenous Given 11/30/18 1302)    Mobility non-ambulatory High fall risk   Focused Assessments    R Recommendations: See Admitting Provider Note  Report given to:   Additional Notes:

## 2018-11-30 NOTE — H&P (Signed)
[name] at [place] NAME: [name]    MR#:  [id number]  DATE OF BIRTH:  [birth year]  DATE OF ADMISSION:  11/30/2018  PRIMARY CARE PHYSICIAN: Baxter Hire, MD   REQUESTING/REFERRING PHYSICIAN: Dr Cherylann Banas  CHIEF COMPLAINT:   Chief Complaint  Patient presents with  . Fall  . Shoulder Pain  . Hip Pain    HISTORY OF PRESENT ILLNESS:  [name]  is a 85 y.o. female with a known history of stroke, polycythemia vera presents after a fall.  Patient is not a great historian she states that she was reaching for her walker and it gave way and she fell over.  She lives alone but has 2 caretakers.  Patient having severe pain in the left hip.  Found to have a left hip fracture.  Hospitalist services were contacted for further evaluation.  No chest pain or shortness of breath.  PAST MEDICAL HISTORY:   Past Medical History:  Diagnosis Date  . Adenocarcinoma in situ of cervix   . Arthritis    hands  . Breast cancer ([place])   . Colon cancer ([place])   . Diabetes mellitus without complication ([place])   . Endometrial carcinoma ([facility])    s/p total abdominal hysterectomy  . H/O compression fracture of spine 2014   thoracic spine  . H/O polycythemia vera   . H/O TIA (transient ischemic attack) and stroke 09/2014, 03/2015   No deficits  . Hypertension   . Hyperuricemia   . Microalbuminuria   . Myocardial infarction ([place])   . Polycythemia vera ([place])   . Recurrent falls   . Skin cancer    face, legs  . Stroke [facility]) 2008   no deficits  . Trochanteric bursitis   . Varicose veins    treated    PAST SURGICAL HISTORY:   Past Surgical History:  Procedure Laterality Date  . ABDOMINAL HYSTERECTOMY    . BOWEL RESECTION N/A 03/28/2015   Procedure: SMALL BOWEL RESECTION;  Surgeon: Leonie Green, MD;  Location: [facility] ORS;  Service: General;  Laterality: N/A;  . CATARACT EXTRACTION W/ INTRAOCULAR LENS IMPLANT Right   . CATARACT  EXTRACTION W/PHACO Left 10/07/2015   Procedure: CATARACT EXTRACTION PHACO AND INTRAOCULAR LENS PLACEMENT (IOC);  Surgeon: Ronnell Freshwater, MD;  Location: [facility];  Service: Ophthalmology;  Laterality: Left;  DIABETIC - oral meds VISION BLUE  . CORONARY ANGIOGRAPHY N/A 10/29/2017   Procedure: CORONARY ANGIOGRAPHY;  Surgeon: Dionisio David, MD;  Location: [place] [facility];  Service: Cardiovascular;  Laterality: N/A;  . EXPLORATORY LAPAROTOMY     for fibroids  . LEFT HEART CATH Right 10/29/2017   Procedure: Left Heart Cath;  Surgeon: Dionisio David, MD;  Location: [place] [facility];  Service: Cardiovascular;  Laterality: Right;  . TONSILLECTOMY      SOCIAL HISTORY:   Social History   Tobacco Use  . Smoking status: Former Research scientist (life sciences)  . Smokeless tobacco: Never Used  . Tobacco comment: quit 30+ yrs ago  Substance Use Topics  . Alcohol use: No    FAMILY HISTORY:   Family History  Problem Relation Age of Onset  . Cancer Brother        AML  . Heart attack Mother   . Breast cancer Mother   . Heart attack Father   . Diabetes Father   . Heart attack Sister   . Diabetes Sister     DRUG ALLERGIES:  Allergies  Allergen Reactions  . Simvastatin     Other reaction(s): Muscle Pain    REVIEW OF SYSTEMS:  CONSTITUTIONAL: No fever, chills or sweats.  Had a 40 pound weight loss over the past 4 years.  Positive for fatigue. EYES: Positive for poor vision. EARS, NOSE, AND THROAT: No tinnitus or ear pain. No sore throat. RESPIRATORY: Some cough, and shortness of breath.  No wheezing or hemoptysis.  CARDIOVASCULAR: No chest pain, orthopnea, edema.  GASTROINTESTINAL: No nausea, vomiting, diarrhea or abdominal pain. No blood in bowel movements.  Positive for constipation GENITOURINARY: No dysuria, hematuria.  ENDOCRINE: No polyuria, nocturia,  HEMATOLOGY: No anemia.positive for easy bruising SKIN: No rash or lesion. MUSCULOSKELETAL: Left hip pain NEUROLOGIC: No  tingling, numbness, weakness.  PSYCHIATRY: No anxiety or depression.   MEDICATIONS AT HOME:   Prior to Admission medications   Medication Sig Start Date End Date Taking? Authorizing Provider  amLODipine (NORVASC) 2.5 MG tablet Take 1 tablet (2.5 mg total) by mouth daily. 11/17/17   Angiulli, Lavon Paganini, PA-C  Biotin 10 MG CAPS Take 1 tablet by mouth daily.    [provider]  blood glucose meter kit and supplies Dispense based on patient and insurance preference. Use up to four times daily as directed. (FOR ICD-10 E10.9, E11.9). 11/16/17   Angiulli, Lavon Paganini, PA-C  calcium-vitamin D (OSCAL WITH D) 500-200 MG-UNIT per tablet Take 1 tablet by mouth daily.     [provider]  carvedilol (COREG) 25 MG tablet Take 1 tablet (25 mg total) by mouth 2 (two) times daily with a meal. Patient taking differently: Take 12.5 mg by mouth 2 (two) times daily with a meal. Confirmed with Dr. Etta Quill nurse, patient is to only be taking 0.5 tab bid 11/16/17   Angiulli, Lavon Paganini, PA-C  clopidogrel (PLAVIX) 75 MG tablet Take 1 tablet (75 mg total) by mouth daily. 12/17/17   Venancio Poisson, NP  docusate sodium (COLACE) 250 MG capsule Take 250 mg by mouth daily as needed for constipation.    [provider]  fexofenadine (ALLEGRA ALLERGY) 180 MG tablet Take 1 tablet (180 mg total) by mouth daily. 12/17/17   Venancio Poisson, NP  hydroxyurea (HYDREA) 500 MG capsule Take 1 tablet daily except on Saturdays 11/16/17   Angiulli, Lavon Paganini, PA-C  insulin detemir (LEVEMIR) 100 unit/ml SOLN Inject 0.08 mLs (8 Units total) into the skin at bedtime. Patient taking differently: Inject 10 Units into the skin at bedtime.  11/17/17   Angiulli, Lavon Paganini, PA-C  nitroGLYCERIN (NITRODUR - DOSED IN MG/24 HR) 0.2 mg/hr patch Place 1 patch (0.2 mg total) onto the skin daily. Patient taking differently: Place 0.1 mg onto the skin daily.  11/16/17   Angiulli, Lavon Paganini, PA-C  pantoprazole (PROTONIX) 40 MG tablet  Take 1 tablet (40 mg total) by mouth daily. Patient not taking: Reported on 10/26/2018 11/17/17   Angiulli, Lavon Paganini, PA-C  pravastatin (PRAVACHOL) 20 MG tablet Take 1 tablet (20 mg total) by mouth at bedtime. 11/16/17   Angiulli, Lavon Paganini, PA-C  Probiotic Product (PROBIOTIC DAILY PO) Take 1 tablet by mouth daily.    [provider]  ramipril (ALTACE) 5 MG capsule Take 5 mg by mouth 2 (two) times daily.    [provider]  sertraline (ZOLOFT) 25 MG tablet Take 25 mg by mouth daily.    [provider]  spironolactone (ALDACTONE) 25 MG tablet Take 1 tablet (25 mg total) by mouth daily. Patient taking differently: Take 12.5  mg by mouth daily.  11/16/17   Angiulli, Lavon Paganini, PA-C      VITAL SIGNS:  Blood pressure (!) 177/92, pulse 78, resp. rate (!) 32, height _0  (1.651 m), weight 48.1 kg, SpO2 91 %.  PHYSICAL EXAMINATION:  GENERAL:  85 y.o.-year-old patient lying in the bed with no acute distress.  EYES: Pupils equal, round, reactive to light and accommodation. No scleral icterus. Extraocular muscles intact.  HEENT: Head atraumatic, normocephalic. Oropharynx and nasopharynx clear.  NECK:  Supple, no jugular venous distention. No thyroid enlargement, no tenderness.  LUNGS: Normal breath sounds bilaterally, no wheezing, rales,rhonchi or crepitation. No use of accessory muscles of respiration.  CARDIOVASCULAR: S1, S2 normal. No murmurs, rubs, or gallops.  ABDOMEN: Soft, nontender, nondistended. Bowel sounds present. No organomegaly or mass.  EXTREMITIES: Left leg shortened and externally rotated. NEUROLOGIC: Cranial nerves II through XII are intact.  Able to flex and extend bilateral ankles. Sensation intact. Gait not checked.  Patient with some stuttering PSYCHIATRIC: The patient is alert and answers questions.  SKIN: No rash, lesion, or ulcer.   LABORATORY PANEL:   CBC Recent Labs  Lab 11/30/18 1314  WBC 15.8*  HGB 14.3  HCT 44.5  PLT 445*    ------------------------------------------------------------------------------------------------------------------  Chemistries  Recent Labs  Lab 11/30/18 1314  NA 137  K 4.3  CL 103  CO2 25  GLUCOSE 224*  BUN 32*  CREATININE 1.07*  CALCIUM 8.9   ------------------------------------------------------------------------------------------------------------------   RADIOLOGY:  Ct Head Wo Contrast  Result Date: 11/30/2018 CLINICAL DATA:  Head trauma, mechanical fall, denies striking head, denies loss of consciousness, slurred speech, chronic, history stroke, hypertension, breast cancer, colon cancer, diabetes mellitus EXAM: CT HEAD WITHOUT CONTRAST CT CERVICAL SPINE WITHOUT CONTRAST TECHNIQUE: Multidetector CT imaging of the head and cervical spine was performed following the standard protocol without intravenous contrast. Multiplanar CT image reconstructions of the cervical spine were also generated. COMPARISON:  09/27/2018 Correlation: PET-CT 04/27/2018 FINDINGS: CT HEAD FINDINGS Brain: Generalized atrophy. Normal ventricular morphology. No midline shift or mass effect. Small vessel chronic ischemic changes of deep cerebral white matter. No intracranial hemorrhage, mass lesion, evidence of acute infarction, or extra-axial fluid collection. Vascular: No hyperdense vessels. Minimal atherosclerotic calcification of internal carotid arteries at skull base Skull: Intact Sinuses/Orbits: Partial opacification of ethmoid air cells and LEFT mastoid air cells. Remaining visualized paranasal sinuses and RIGHT mastoid air cells clear Other: N/A CT CERVICAL SPINE FINDINGS Alignment: Normal Skull base and vertebrae: Osseous demineralization. Skull base intact. Multilevel facet degenerative changes. Degenerative disc disease changes of the caudal cervical spine. Significant endplate spur formation and uncovertebral spurring at C5-C6 and C6-C7 narrow the LEFT neural foramina. No fracture, subluxation or bone  destruction. Soft tissues and spinal canal: Prevertebral soft tissues normal thickness Disc levels:  No significant abnormalities Upper chest: 9 mm focus of ground-glass opacity at LEFT apex unchanged since 04/27/2018. Remaining apices clear. Other: N/A IMPRESSION: Atrophy with small vessel chronic ischemic changes of deep cerebral white matter. No acute intracranial abnormalities. Degenerative disc and facet disease changes of the cervical spine. No acute cervical spine abnormalities. 9 mm focus of ground-glass opacity at LEFT apex, stable since 04/27/2018, when it demonstrated no worrisome FDG accumulation. Electronically Signed   By: Lavonia Dana M.D.   On: 11/30/2018 13:07   Ct Cervical Spine Wo Contrast  Result Date: 11/30/2018 CLINICAL DATA:  Head trauma, mechanical fall, denies striking head, denies loss of consciousness, slurred speech, chronic, history stroke, hypertension, breast cancer, colon cancer, diabetes  mellitus EXAM: CT HEAD WITHOUT CONTRAST CT CERVICAL SPINE WITHOUT CONTRAST TECHNIQUE: Multidetector CT imaging of the head and cervical spine was performed following the standard protocol without intravenous contrast. Multiplanar CT image reconstructions of the cervical spine were also generated. COMPARISON:  09/27/2018 Correlation: PET-CT 04/27/2018 FINDINGS: CT HEAD FINDINGS Brain: Generalized atrophy. Normal ventricular morphology. No midline shift or mass effect. Small vessel chronic ischemic changes of deep cerebral white matter. No intracranial hemorrhage, mass lesion, evidence of acute infarction, or extra-axial fluid collection. Vascular: No hyperdense vessels. Minimal atherosclerotic calcification of internal carotid arteries at skull base Skull: Intact Sinuses/Orbits: Partial opacification of ethmoid air cells and LEFT mastoid air cells. Remaining visualized paranasal sinuses and RIGHT mastoid air cells clear Other: N/A CT CERVICAL SPINE FINDINGS Alignment: Normal Skull base and  vertebrae: Osseous demineralization. Skull base intact. Multilevel facet degenerative changes. Degenerative disc disease changes of the caudal cervical spine. Significant endplate spur formation and uncovertebral spurring at C5-C6 and C6-C7 narrow the LEFT neural foramina. No fracture, subluxation or bone destruction. Soft tissues and spinal canal: Prevertebral soft tissues normal thickness Disc levels:  No significant abnormalities Upper chest: 9 mm focus of ground-glass opacity at LEFT apex unchanged since 04/27/2018. Remaining apices clear. Other: N/A IMPRESSION: Atrophy with small vessel chronic ischemic changes of deep cerebral white matter. No acute intracranial abnormalities. Degenerative disc and facet disease changes of the cervical spine. No acute cervical spine abnormalities. 9 mm focus of ground-glass opacity at LEFT apex, stable since 04/27/2018, when it demonstrated no worrisome FDG accumulation. Electronically Signed   By: Lavonia Dana M.D.   On: 11/30/2018 13:07   Dg Shoulder Left  Result Date: 11/30/2018 CLINICAL DATA:  Pain after fall EXAM: LEFT SHOULDER - 2+ VIEW COMPARISON:  September 27, 2018 FINDINGS: Again noted is an impacted fracture of the proximal left humerus through the neck and greater tuberosity. There is incomplete associated callus formation. No other fractures noted. No dislocation identified. IMPRESSION: Again noted is an impacted fracture through the left humeral neck and greater tubercle. There is associated callus formation although incomplete. The displacement is mildly increased compared to the April 2020 study but there appears to be bridging callus formation which would argue against an acute on chronic fracture. Recommend clinical correlation. Electronically Signed   By: Dorise Bullion III M.D   On: 11/30/2018 12:37   Dg Hip Unilat With Pelvis 2-3 Views Left  Result Date: 11/30/2018 CLINICAL DATA:  Left hip pain after fall. EXAM: DG HIP (WITH OR WITHOUT PELVIS) 2-3V LEFT  COMPARISON:  None. FINDINGS: There is a displaced subcapital fracture through the proximal left femur. The lateral view is limited but there is no convincing evidence of dislocation based on the frontal views. The right hip is intact. The pelvic bones are otherwise unremarkable. IMPRESSION: Displaced subcapital fracture in the proximal left femur/hip as above. Electronically Signed   By: Dorise Bullion III M.D   On: 11/30/2018 12:34    EKG:   Sinus rhythm 66 bpm left anterior fascicular block  IMPRESSION AND PLAN:   1.  Preoperative consultation for left hip fracture.  No contraindications to surgery at this time.  With history of stroke and CAD patient is a moderate risk for cardiovascular complications.  Surgery must be done to prevent major complications from not walking again including pneumonia, skin breakdown and DVT.  Mortality high within the first year of a hip fracture especially if they do not get up and walk again. 2.  History of stroke  and CAD.  Plavix on hold for procedure.  Patient on low-dose Coreg and statin. 3.  Accelerated hypertension secondary to pain.  Try to control pain better give Norvasc now and continue antihypertensive medications. 4.  Type 2 diabetes mellitus we will give Lantus later this evening and sliding scale.  Add on a hemoglobin A1c. 5.  Hyperlipidemia unspecified on Pravachol 6.  Polycythemia vera continue to watch blood counts and continue Hydrea 7.  Depression on Zoloft  All the records are reviewed and case discussed with ED provider. Management plans discussed with the patient,  and she is in agreement. I tried to reach the patient's niece who is the POA but left a message at this time.  CODE STATUS: DNR  TOTAL TIME TAKING CARE OF THIS PATIENT: 50 minutes, including acp time.    Loletha Grayer M.D on 11/30/2018 at 2:40 PM  Between 7am to 6pm - Pager - [phone]  After 6pm call admission pager [phone]  [facility]   [phone]  CC: Primary care physician; Baxter Hire, MD

## 2018-11-30 NOTE — TOC Benefit Eligibility Note (Signed)
Transition of Care [facility]) Benefit Eligibility Note    Patient Details  Name: [name] MRN: [id number] Date of Birth: Dec 06, 1931     Medication Benefit Check [place], [phone]  Enoxaparin 40 mg, daily x 14 days $10 copay Tier 3  Lovenox 40 mg., daily x 14 days $50 copay Tier 4  No prior approval needed Deductible = $435, Met Preferred Pharmacy:  [facility] [place], [place] Phone Number: 11/30/2018, 3:18 PM

## 2018-11-30 NOTE — ED Notes (Signed)
All extremities, distal pulses intact, cap refill <3 sec,

## 2018-11-30 NOTE — TOC Progression Note (Signed)
Transition of Care [facility]) - Progression Note    Patient Details  Name: [name] MRN: [id number] Date of Birth: [birth year]  [facility] of [facility]) CM/SW Contact  Randall Colden, Veronia Beets, [place] Phone Number: 11/30/2018, 2:37 PM  Clinical Narrative:   Requested price for Lovenox will notify patient once obtained.         Expected Discharge Plan and Services                                                 Social Determinants of Health (SDOH) Interventions    Readmission Risk Interventions No flowsheet data found.

## 2018-11-30 NOTE — Consult Note (Signed)
Cardiology Consultation Note    Patient ID: [name], MRN: [id number], DOB/AGE: [birth year] 85 y.o. Admit date: 11/30/2018   Date of Consult: 11/30/2018 Primary Physician: Baxter Hire, MD Primary Cardiologist: Dr. Clayborn Bigness  Chief Complaint: hip fraacture Reason for Consultation: preop evaluation Requesting MD: Dr. Leslye Peer  HPI: [name] is a 85 y.o. female with history of coronary artery disease treated medically, history of ischemic cardiomyopathy with an ejection fraction 25%, history of three-vessel coronary disease noted by cardiac cath 1 year ago felt to be too high risk for coronary artery bypass grafting as well as PCI and was treated medically, post cardiac cath suffered a right cerebellar hemorrhage.  Who is now presenting with a hip fracture.  Contemplation is to proceed with surgical intervention later on today.  EKG reveals sinus rhythm with no ischemic changes.  Laboratories show renal function with a GFR of 48.  White count is 15.8.  H&H is normal.  COVID is pending.  She has been treated with carvedilol 25 mg twice daily, amlodipine 2.5 mg daily, clopidogrel 75 mg daily, ramipril 5 mg twice daily, pravastatin 20 mg daily, spironolactone 25 mg daily.  She denies chest pain.  Past Medical History:  Diagnosis Date  . Adenocarcinoma in situ of cervix   . Arthritis    hands  . Breast cancer ([place])   . Colon cancer ([place])   . Diabetes mellitus without complication ([place])   . Endometrial carcinoma (H[facility])    s/p total abdominal hysterectomy  . H/O compression fracture of spine 2014   thoracic spine  . H/O polycythemia vera   . H/O TIA (transient ischemic attack) and stroke 09/2014, 03/2015   No deficits  . Hypertension   . Hyperuricemia   . Microalbuminuria   . Myocardial infarction ([place])   . Polycythemia vera ([place])   . Recurrent falls   . Skin cancer    face, legs  . Stroke [facility]) 2008   no deficits  . Trochanteric bursitis   . Varicose veins    treated       Surgical History:  Past Surgical History:  Procedure Laterality Date  . ABDOMINAL HYSTERECTOMY    . BOWEL RESECTION N/A 03/28/2015   Procedure: SMALL BOWEL RESECTION;  Surgeon: Leonie Green, MD;  Location: [facility] ORS;  Service: General;  Laterality: N/A;  . CATARACT EXTRACTION W/ INTRAOCULAR LENS IMPLANT Right   . CATARACT EXTRACTION W/PHACO Left 10/07/2015   Procedure: CATARACT EXTRACTION PHACO AND INTRAOCULAR LENS PLACEMENT (IOC);  Surgeon: Ronnell Freshwater, MD;  Location: [place];  Service: Ophthalmology;  Laterality: Left;  DIABETIC - oral meds VISION BLUE  . CORONARY ANGIOGRAPHY N/A 10/29/2017   Procedure: CORONARY ANGIOGRAPHY;  Surgeon: Dionisio David, MD;  Location: [place] [facility];  Service: Cardiovascular;  Laterality: N/A;  . EXPLORATORY LAPAROTOMY     for fibroids  . LEFT HEART CATH Right 10/29/2017   Procedure: Left Heart Cath;  Surgeon: Dionisio David, MD;  Location: [place] CV LAB;  Service: Cardiovascular;  Laterality: Right;  . TONSILLECTOMY       Home Meds: Prior to Admission medications   Medication Sig Start Date End Date Taking? Authorizing Provider  amLODipine (NORVASC) 2.5 MG tablet Take 1 tablet (2.5 mg total) by mouth daily. 11/17/17   Angiulli, Lavon Paganini, PA-C  Biotin 10 MG CAPS Take 1 tablet by mouth daily.    [provider]  blood glucose meter kit and supplies Dispense based  on patient and insurance preference. Use up to four times daily as directed. (FOR ICD-10 E10.9, E11.9). 11/16/17   Angiulli, Lavon Paganini, PA-C  calcium-vitamin D (OSCAL WITH D) 500-200 MG-UNIT per tablet Take 1 tablet by mouth daily.     [provider]  carvedilol (COREG) 25 MG tablet Take 1 tablet (25 mg total) by mouth 2 (two) times daily with a meal. Patient taking differently: Take 12.5 mg by mouth 2 (two) times daily with a meal. Confirmed with Dr. Etta Quill nurse, patient is to only be taking 0.5 tab bid 11/16/17   Angiulli, Lavon Paganini,  PA-C  clopidogrel (PLAVIX) 75 MG tablet Take 1 tablet (75 mg total) by mouth daily. 12/17/17   Venancio Poisson, NP  docusate sodium (COLACE) 250 MG capsule Take 250 mg by mouth daily as needed for constipation.    [provider]  fexofenadine (ALLEGRA ALLERGY) 180 MG tablet Take 1 tablet (180 mg total) by mouth daily. 12/17/17   Venancio Poisson, NP  hydroxyurea (HYDREA) 500 MG capsule Take 1 tablet daily except on Saturdays 11/16/17   Angiulli, Lavon Paganini, PA-C  insulin detemir (LEVEMIR) 100 unit/ml SOLN Inject 0.08 mLs (8 Units total) into the skin at bedtime. Patient taking differently: Inject 10 Units into the skin at bedtime.  11/17/17   Angiulli, Lavon Paganini, PA-C  nitroGLYCERIN (NITRODUR - DOSED IN MG/24 HR) 0.2 mg/hr patch Place 1 patch (0.2 mg total) onto the skin daily. Patient taking differently: Place 0.1 mg onto the skin daily.  11/16/17   Angiulli, Lavon Paganini, PA-C  pantoprazole (PROTONIX) 40 MG tablet Take 1 tablet (40 mg total) by mouth daily. Patient not taking: Reported on 10/26/2018 11/17/17   Angiulli, Lavon Paganini, PA-C  pravastatin (PRAVACHOL) 20 MG tablet Take 1 tablet (20 mg total) by mouth at bedtime. 11/16/17   Angiulli, Lavon Paganini, PA-C  Probiotic Product (PROBIOTIC DAILY PO) Take 1 tablet by mouth daily.    [provider]  ramipril (ALTACE) 5 MG capsule Take 5 mg by mouth 2 (two) times daily.    [provider]  sertraline (ZOLOFT) 25 MG tablet Take 25 mg by mouth daily.    [provider]  spironolactone (ALDACTONE) 25 MG tablet Take 1 tablet (25 mg total) by mouth daily. Patient taking differently: Take 12.5 mg by mouth daily.  11/16/17   Cathlyn Parsons, PA-C    Inpatient Medications:  . amLODipine  2.5 mg Oral Daily  . [START ON 12/01/2018] calcium-vitamin D  1 tablet Oral Daily  . carvedilol  12.5 mg Oral BID WC  . [START ON 12/01/2018] hydroxyurea  500 mg Oral Once per day on Sun Mon Tue Wed Thu Fri  . insulin aspart  0-5 Units  Subcutaneous QHS  . insulin aspart  0-9 Units Subcutaneous TID WC  . insulin detemir  8 Units Subcutaneous QHS  . [START ON 12/01/2018] loratadine  10 mg Oral Daily  . nitroGLYCERIN  0.1 mg Transdermal Daily  . [START ON 12/01/2018] pantoprazole  40 mg Oral Daily  . pravastatin  20 mg Oral QHS  . [START ON 12/01/2018] Probiotic Daily  1 capsule Oral Daily  . ramipril  5 mg Oral BID  . [START ON 12/01/2018] sertraline  25 mg Oral Daily  . [START ON 12/01/2018] spironolactone  12.5 mg Oral Daily   .  ceFAZolin (ANCEF) IV      Allergies:  Allergies  Allergen Reactions  . Simvastatin     Other reaction(s): Muscle Pain  Social History   Socioeconomic History  . Marital status: Widowed    Spouse name: Not on file  . Number of children: Not on file  . Years of education: Not on file  . Highest education level: Not on file  Occupational History  . Not on file  Social Needs  . Financial resource strain: Not on file  . Food insecurity:    Worry: Not on file    Inability: Not on file  . Transportation needs:    Medical: Not on file    Non-medical: Not on file  Tobacco Use  . Smoking status: Former Research scientist (life sciences)  . Smokeless tobacco: Never Used  . Tobacco comment: quit 30+ yrs ago  Substance and Sexual Activity  . Alcohol use: No  . Drug use: No  . Sexual activity: Not on file  Lifestyle  . Physical activity:    Days per week: Not on file    Minutes per session: Not on file  . Stress: Not on file  Relationships  . Social connections:    Talks on phone: Not on file    Gets together: Not on file    Attends religious service: Not on file    Active member of club or organization: Not on file    Attends meetings of clubs or organizations: Not on file    Relationship status: Not on file  . Intimate partner violence:    Fear of current or ex partner: Not on file    Emotionally abused: Not on file    Physically abused: Not on file    Forced sexual activity: Not on file  Other  Topics Concern  . Not on file  Social History Narrative  . Not on file     Family History  Problem Relation Age of Onset  . Cancer Brother        AML  . Heart attack Mother   . Breast cancer Mother   . Heart attack Father   . Diabetes Father   . Heart attack Sister   . Diabetes Sister      Review of Systems: A 12-system review of systems was performed and is negative except as noted in the HPI.  Labs: No results for input(s): CKTOTAL, CKMB, TROPONINI in the last 72 hours. Lab Results  Component Value Date   WBC 15.8 (H) 11/30/2018   HGB 14.3 11/30/2018   HCT 44.5 11/30/2018   MCV 94.7 11/30/2018   PLT 445 (H) 11/30/2018    Recent Labs  Lab 11/30/18 1314  NA 137  K 4.3  CL 103  CO2 25  BUN 32*  CREATININE 1.07*  CALCIUM 8.9  GLUCOSE 224*   Lab Results  Component Value Date   CHOL 148 12/20/2014   HDL 44 12/20/2014   LDLCALC 78 12/20/2014   TRIG 130 12/20/2014   No results found for: DDIMER  Radiology/Studies:  Ct Head Wo Contrast  Result Date: 11/30/2018 CLINICAL DATA:  Head trauma, mechanical fall, denies striking head, denies loss of consciousness, slurred speech, chronic, history stroke, hypertension, breast cancer, colon cancer, diabetes mellitus EXAM: CT HEAD WITHOUT CONTRAST CT CERVICAL SPINE WITHOUT CONTRAST TECHNIQUE: Multidetector CT imaging of the head and cervical spine was performed following the standard protocol without intravenous contrast. Multiplanar CT image reconstructions of the cervical spine were also generated. COMPARISON:  09/27/2018 Correlation: PET-CT 04/27/2018 FINDINGS: CT HEAD FINDINGS Brain: Generalized atrophy. Normal ventricular morphology. No midline shift or mass effect. Small vessel chronic ischemic changes of deep  cerebral white matter. No intracranial hemorrhage, mass lesion, evidence of acute infarction, or extra-axial fluid collection. Vascular: No hyperdense vessels. Minimal atherosclerotic calcification of internal carotid  arteries at skull base Skull: Intact Sinuses/Orbits: Partial opacification of ethmoid air cells and LEFT mastoid air cells. Remaining visualized paranasal sinuses and RIGHT mastoid air cells clear Other: N/A CT CERVICAL SPINE FINDINGS Alignment: Normal Skull base and vertebrae: Osseous demineralization. Skull base intact. Multilevel facet degenerative changes. Degenerative disc disease changes of the caudal cervical spine. Significant endplate spur formation and uncovertebral spurring at C5-C6 and C6-C7 narrow the LEFT neural foramina. No fracture, subluxation or bone destruction. Soft tissues and spinal canal: Prevertebral soft tissues normal thickness Disc levels:  No significant abnormalities Upper chest: 9 mm focus of ground-glass opacity at LEFT apex unchanged since 04/27/2018. Remaining apices clear. Other: N/A IMPRESSION: Atrophy with small vessel chronic ischemic changes of deep cerebral white matter. No acute intracranial abnormalities. Degenerative disc and facet disease changes of the cervical spine. No acute cervical spine abnormalities. 9 mm focus of ground-glass opacity at LEFT apex, stable since 04/27/2018, when it demonstrated no worrisome FDG accumulation. Electronically Signed   By: Lavonia Dana M.D.   On: 11/30/2018 13:07   Ct Cervical Spine Wo Contrast  Result Date: 11/30/2018 CLINICAL DATA:  Head trauma, mechanical fall, denies striking head, denies loss of consciousness, slurred speech, chronic, history stroke, hypertension, breast cancer, colon cancer, diabetes mellitus EXAM: CT HEAD WITHOUT CONTRAST CT CERVICAL SPINE WITHOUT CONTRAST TECHNIQUE: Multidetector CT imaging of the head and cervical spine was performed following the standard protocol without intravenous contrast. Multiplanar CT image reconstructions of the cervical spine were also generated. COMPARISON:  09/27/2018 Correlation: PET-CT 04/27/2018 FINDINGS: CT HEAD FINDINGS Brain: Generalized atrophy. Normal ventricular  morphology. No midline shift or mass effect. Small vessel chronic ischemic changes of deep cerebral white matter. No intracranial hemorrhage, mass lesion, evidence of acute infarction, or extra-axial fluid collection. Vascular: No hyperdense vessels. Minimal atherosclerotic calcification of internal carotid arteries at skull base Skull: Intact Sinuses/Orbits: Partial opacification of ethmoid air cells and LEFT mastoid air cells. Remaining visualized paranasal sinuses and RIGHT mastoid air cells clear Other: N/A CT CERVICAL SPINE FINDINGS Alignment: Normal Skull base and vertebrae: Osseous demineralization. Skull base intact. Multilevel facet degenerative changes. Degenerative disc disease changes of the caudal cervical spine. Significant endplate spur formation and uncovertebral spurring at C5-C6 and C6-C7 narrow the LEFT neural foramina. No fracture, subluxation or bone destruction. Soft tissues and spinal canal: Prevertebral soft tissues normal thickness Disc levels:  No significant abnormalities Upper chest: 9 mm focus of ground-glass opacity at LEFT apex unchanged since 04/27/2018. Remaining apices clear. Other: N/A IMPRESSION: Atrophy with small vessel chronic ischemic changes of deep cerebral white matter. No acute intracranial abnormalities. Degenerative disc and facet disease changes of the cervical spine. No acute cervical spine abnormalities. 9 mm focus of ground-glass opacity at LEFT apex, stable since 04/27/2018, when it demonstrated no worrisome FDG accumulation. Electronically Signed   By: Lavonia Dana M.D.   On: 11/30/2018 13:07   Dg Shoulder Left  Result Date: 11/30/2018 CLINICAL DATA:  Pain after fall EXAM: LEFT SHOULDER - 2+ VIEW COMPARISON:  September 27, 2018 FINDINGS: Again noted is an impacted fracture of the proximal left humerus through the neck and greater tuberosity. There is incomplete associated callus formation. No other fractures noted. No dislocation identified. IMPRESSION: Again noted  is an impacted fracture through the left humeral neck and greater tubercle. There is associated callus formation although incomplete.  The displacement is mildly increased compared to the April 2020 study but there appears to be bridging callus formation which would argue against an acute on chronic fracture. Recommend clinical correlation. Electronically Signed   By: Dorise Bullion III M.D   On: 11/30/2018 12:37   Dg Hip Unilat With Pelvis 2-3 Views Left  Result Date: 11/30/2018 CLINICAL DATA:  Left hip pain after fall. EXAM: DG HIP (WITH OR WITHOUT PELVIS) 2-3V LEFT COMPARISON:  None. FINDINGS: There is a displaced subcapital fracture through the proximal left femur. The lateral view is limited but there is no convincing evidence of dislocation based on the frontal views. The right hip is intact. The pelvic bones are otherwise unremarkable. IMPRESSION: Displaced subcapital fracture in the proximal left femur/hip as above. Electronically Signed   By: Dorise Bullion III M.D   On: 11/30/2018 12:34    Wt Readings from Last 3 Encounters:  11/30/18 48.1 kg  10/26/18 45.6 kg  07/08/18 48.4 kg    EKG: Normal sinus rhythm left anterior fascicular block  Physical Exam:  Blood pressure (!) 173/95, pulse 89, resp. rate (!) 26, height _0  (1.651 m), weight 48.1 kg, SpO2 100 %. Body mass index is 17.64 kg/m. General: Well developed, well nourished, in no acute distress. Head: Normocephalic, atraumatic, sclera non-icteric, no xanthomas, nares are without discharge.  Neck: Negative for carotid bruits. JVD not elevated. Lungs: Clear bilaterally to auscultation without wheezes, rales, or rhonchi. Breathing is unlabored. Heart: RRR with S1 S2. No murmurs, rubs, or gallops appreciated. Abdomen: Soft, non-tender, non-distended with normoactive bowel sounds. No hepatomegaly. No rebound/guarding. No obvious abdominal masses. Msk:  Strength and tone appear normal for age. Extremities: No clubbing or cyanosis.  No edema.  Distal pedal pulses are 2+ and equal bilaterally. Neuro: Alert and oriented X 3. No facial asymmetry. No focal deficit. Moves all extremities spontaneously. Psych:  Responds to questions appropriately with a normal affect.     Assessment and Plan  85 year old female with history of ischemic cardiomyopathy EF 25%, history of coronary artery disease, history of CVA who presented emergency room with hip fracture.  Patient will need urgent intervention.  Patient is at moderate to high risk from a cardiac standpoint for the procedure however is optimized as best possible.  Would proceed with surgery as planned.  No further cardiac work-up with lower risk.  Would continue with carvedilol throughout the perioperative period.  Patient may stop clopidogrel if needed from a surgical standpoint.  Signed, Teodoro Spray MD 11/30/2018, 3:38 PM Pager: [phone]

## 2018-12-01 ENCOUNTER — Encounter
Admission: EM | Disposition: A | Discharge status: Home Health | Payer: Self-pay | Source: Home / Self Care | Attending: Internal Medicine

## 2018-12-01 ENCOUNTER — Inpatient Hospital Stay: Payer: Medicare Other

## 2018-12-01 ENCOUNTER — Inpatient Hospital Stay: Payer: Medicare Other | Admitting: Anesthesiology

## 2018-12-01 HISTORY — PX: HIP ARTHROPLASTY: SHX981

## 2018-12-01 LAB — BASIC METABOLIC PANEL
Anion gap: 8 (ref 5–15)
BUN: 32 mg/dL — ABNORMAL HIGH (ref 8–23)
CO2: 27 mmol/L (ref 22–32)
Calcium: 8.9 mg/dL (ref 8.9–10.3)
Chloride: 102 mmol/L (ref 98–111)
Creatinine, Ser: 1.06 mg/dL — ABNORMAL HIGH (ref 0.44–1.00)
GFR calc Af Amer: 56 mL/min — ABNORMAL LOW (ref >60)
GFR calc non Af Amer: 48 mL/min — ABNORMAL LOW (ref >60)
Glucose, Bld: 205 mg/dL — ABNORMAL HIGH (ref 70–99)
Potassium: 4.2 mmol/L (ref 3.5–5.1)
Sodium: 137 mmol/L (ref 135–145)

## 2018-12-01 LAB — GLUCOSE, CAPILLARY
Glucose-Capillary: 135 mg/dL — ABNORMAL HIGH (ref 70–99)
Glucose-Capillary: 122 mg/dL — ABNORMAL HIGH (ref 70–99)
Glucose-Capillary: 164 mg/dL — ABNORMAL HIGH (ref 70–99)
Glucose-Capillary: 192 mg/dL — ABNORMAL HIGH (ref 70–99)
Glucose-Capillary: 200 mg/dL — ABNORMAL HIGH (ref 70–99)

## 2018-12-01 LAB — CBC
HCT: 42.5 % (ref 36.0–46.0)
Hemoglobin: 13.5 g/dL (ref 12.0–15.0)
MCH: 30.7 pg (ref 26.0–34.0)
MCHC: 31.8 g/dL (ref 30.0–36.0)
MCV: 96.6 fL (ref 80.0–100.0)
Platelets: 402 10*3/uL — ABNORMAL HIGH (ref 150–400)
RBC: 4.4 MIL/uL (ref 3.87–5.11)
RDW: 15.7 % — ABNORMAL HIGH (ref 11.5–15.5)
WBC: 12.7 10*3/uL — ABNORMAL HIGH (ref 4.0–10.5)
nRBC: 0 % (ref 0.0–0.2)

## 2018-12-01 SURGERY — HEMIARTHROPLASTY, HIP, DIRECT ANTERIOR APPROACH, FOR FRACTURE
Anesthesia: General | Laterality: Left

## 2018-12-01 MED ORDER — PHENYLEPHRINE HCL (PRESSORS) 10 MG/ML IV SOLN
INTRAVENOUS | Status: AC
  Filled 2018-12-01: qty 1

## 2018-12-01 MED ORDER — ROCURONIUM BROMIDE 50 MG/5ML IV SOLN
INTRAVENOUS | Status: AC
  Filled 2018-12-01: qty 1

## 2018-12-01 MED ORDER — LIDOCAINE HCL (PF) 2 % IJ SOLN
INTRAMUSCULAR | Status: AC
  Filled 2018-12-01: qty 10

## 2018-12-01 MED ORDER — SUGAMMADEX SODIUM 200 MG/2ML IV SOLN
INTRAVENOUS | Status: AC
  Filled 2018-12-01: qty 2

## 2018-12-01 MED ORDER — SUCCINYLCHOLINE CHLORIDE 20 MG/ML IJ SOLN
INTRAMUSCULAR | Status: AC
  Filled 2018-12-01: qty 1

## 2018-12-01 MED ORDER — LIDOCAINE HCL (CARDIAC) PF 100 MG/5ML IV SOSY
PREFILLED_SYRINGE | INTRAVENOUS | Status: DC | PRN
  Administered 2018-12-01: 60 mg via INTRAVENOUS

## 2018-12-01 MED ORDER — SODIUM CHLORIDE 0.9 % IV SOLN
INTRAVENOUS | Status: DC | PRN
  Administered 2018-12-01: 40 ug/min via INTRAVENOUS

## 2018-12-01 MED ORDER — ONDANSETRON HCL 4 MG/2ML IJ SOLN: 4.0000 mg | Freq: Four times a day (QID) | INTRAMUSCULAR | Status: DC | PRN

## 2018-12-01 MED ORDER — PROPOFOL 10 MG/ML IV BOLUS
INTRAVENOUS | Status: DC | PRN
  Administered 2018-12-01: 60 mg via INTRAVENOUS

## 2018-12-01 MED ORDER — CEFAZOLIN SODIUM-DEXTROSE 2-4 GM/100ML-% IV SOLN
2.0000 g | Freq: Four times a day (QID) | INTRAVENOUS | Status: DC
  Administered 2018-12-01 – 2018-12-02 (×2): 2 g via INTRAVENOUS
  Filled 2018-12-01 (×3): qty 100

## 2018-12-01 MED ORDER — CEFAZOLIN SODIUM-DEXTROSE 2-4 GM/100ML-% IV SOLN
2.0000 g | Freq: Once | INTRAVENOUS | Status: AC
  Administered 2018-12-01: 2 g via INTRAVENOUS
  Filled 2018-12-01: qty 100

## 2018-12-01 MED ORDER — GLUCERNA SHAKE PO LIQD
237.0000 mL | Freq: Three times a day (TID) | ORAL | Status: DC
  Administered 2018-12-02 – 2018-12-04 (×4): 237 mL via ORAL
  Filled 2018-12-01: qty 237

## 2018-12-01 MED ORDER — ENOXAPARIN SODIUM 40 MG/0.4ML ~~LOC~~ SOLN: 40.0000 mg | SUBCUTANEOUS | Status: DC

## 2018-12-01 MED ORDER — METOCLOPRAMIDE HCL 5 MG/ML IJ SOLN: 5.0000 mg | Freq: Three times a day (TID) | INTRAMUSCULAR | Status: DC | PRN

## 2018-12-01 MED ORDER — ADULT MULTIVITAMIN W/MINERALS CH
1.0000 | ORAL_TABLET | Freq: Every day | ORAL | Status: DC
  Administered 2018-12-03 – 2018-12-04 (×2): 1 via ORAL
  Filled 2018-12-01 (×4): qty 1

## 2018-12-01 MED ORDER — BUPIVACAINE-EPINEPHRINE (PF) 0.25% -1:200000 IJ SOLN
INTRAMUSCULAR | Status: DC | PRN
  Administered 2018-12-01: 30 mL

## 2018-12-01 MED ORDER — SUGAMMADEX SODIUM 200 MG/2ML IV SOLN
INTRAVENOUS | Status: DC | PRN
  Administered 2018-12-01: 100 mg via INTRAVENOUS

## 2018-12-01 MED ORDER — FENTANYL CITRATE (PF) 100 MCG/2ML IJ SOLN: 25.0000 ug | INTRAMUSCULAR | Status: DC | PRN

## 2018-12-01 MED ORDER — FLEET ENEMA 7-19 GM/118ML RE ENEM: 1.0000 | ENEMA | Freq: Once | RECTAL | Status: DC | PRN

## 2018-12-01 MED ORDER — BUPIVACAINE LIPOSOME 1.3 % IJ SUSP
INTRAMUSCULAR | Status: DC | PRN
  Administered 2018-12-01: 20 mL

## 2018-12-01 MED ORDER — LACTATED RINGERS IV SOLN
INTRAVENOUS | Status: DC | PRN
  Administered 2018-12-01: 09:00:00 via INTRAVENOUS

## 2018-12-01 MED ORDER — ONDANSETRON HCL 4 MG/2ML IJ SOLN
INTRAMUSCULAR | Status: AC
  Filled 2018-12-01: qty 2

## 2018-12-01 MED ORDER — BISACODYL 10 MG RE SUPP
10.0000 mg | Freq: Every day | RECTAL | Status: DC | PRN
  Administered 2018-12-04: 10 mg via RECTAL
  Filled 2018-12-01: qty 1

## 2018-12-01 MED ORDER — SODIUM CHLORIDE 0.9 % IV SOLN
INTRAVENOUS | Status: DC
  Administered 2018-12-01 – 2018-12-02 (×2): via INTRAVENOUS

## 2018-12-01 MED ORDER — ROCURONIUM BROMIDE 100 MG/10ML IV SOLN
INTRAVENOUS | Status: DC | PRN
  Administered 2018-12-01: 40 mg via INTRAVENOUS

## 2018-12-01 MED ORDER — MAGNESIUM HYDROXIDE 400 MG/5ML PO SUSP
30.0000 mL | Freq: Every day | ORAL | Status: DC | PRN
  Filled 2018-12-01: qty 30

## 2018-12-01 MED ORDER — ONDANSETRON HCL 4 MG/2ML IJ SOLN
INTRAMUSCULAR | Status: DC | PRN
  Administered 2018-12-01: 4 mg via INTRAVENOUS

## 2018-12-01 MED ORDER — ONDANSETRON HCL 4 MG PO TABS: 4.0000 mg | ORAL_TABLET | Freq: Four times a day (QID) | ORAL | Status: DC | PRN

## 2018-12-01 MED ORDER — TRAMADOL HCL 50 MG PO TABS
50.0000 mg | ORAL_TABLET | Freq: Four times a day (QID) | ORAL | Status: DC | PRN
  Administered 2018-12-02: 50 mg via ORAL
  Filled 2018-12-01 (×3): qty 1

## 2018-12-01 MED ORDER — TRANEXAMIC ACID 1000 MG/10ML IV SOLN
INTRAVENOUS | Status: DC | PRN
  Administered 2018-12-01: 1000 mg via TOPICAL

## 2018-12-01 MED ORDER — FENTANYL CITRATE (PF) 100 MCG/2ML IJ SOLN
INTRAMUSCULAR | Status: DC | PRN
  Administered 2018-12-01 (×2): 50 ug via INTRAVENOUS

## 2018-12-01 MED ORDER — ONDANSETRON HCL 4 MG/2ML IJ SOLN: 4.0000 mg | Freq: Once | INTRAMUSCULAR | Status: DC | PRN

## 2018-12-01 MED ORDER — OXYCODONE HCL 5 MG PO TABS
5.0000 mg | ORAL_TABLET | ORAL | Status: DC | PRN
  Filled 2018-12-01: qty 1

## 2018-12-01 MED ORDER — PHENYLEPHRINE HCL (PRESSORS) 10 MG/ML IV SOLN
INTRAVENOUS | Status: DC | PRN
  Administered 2018-12-01 (×4): 100 ug via INTRAVENOUS

## 2018-12-01 MED ORDER — DOCUSATE SODIUM 100 MG PO CAPS
100.0000 mg | ORAL_CAPSULE | Freq: Two times a day (BID) | ORAL | Status: DC
  Administered 2018-12-03 – 2018-12-04 (×2): 100 mg via ORAL
  Filled 2018-12-01 (×5): qty 1

## 2018-12-01 MED ORDER — FENTANYL CITRATE (PF) 100 MCG/2ML IJ SOLN
INTRAMUSCULAR | Status: AC
  Filled 2018-12-01: qty 2

## 2018-12-01 MED ORDER — DIPHENHYDRAMINE HCL 12.5 MG/5ML PO ELIX
12.5000 mg | ORAL_SOLUTION | ORAL | Status: DC | PRN
  Filled 2018-12-01: qty 10

## 2018-12-01 MED ORDER — METOCLOPRAMIDE HCL 10 MG PO TABS: 5.0000 mg | ORAL_TABLET | Freq: Three times a day (TID) | ORAL | Status: DC | PRN

## 2018-12-01 MED ORDER — ACETAMINOPHEN 500 MG PO TABS
1000.0000 mg | ORAL_TABLET | Freq: Four times a day (QID) | ORAL | Status: DC
  Administered 2018-12-01 (×2): 1000 mg via ORAL
  Filled 2018-12-01 (×3): qty 2

## 2018-12-01 MED ORDER — ACETAMINOPHEN 325 MG PO TABS
325.0000 mg | ORAL_TABLET | Freq: Four times a day (QID) | ORAL | Status: DC | PRN
  Administered 2018-12-02 – 2018-12-04 (×3): 650 mg via ORAL
  Filled 2018-12-01 (×2): qty 2

## 2018-12-01 SURGICAL SUPPLY — 62 items
BAG DECANTER FOR FLEXI CONT (MISCELLANEOUS) IMPLANT
BLADE SAGITTAL WIDE XTHICK NO (BLADE) ×2 IMPLANT
BLADE SURG SZ20 CARB STEEL (BLADE) ×2 IMPLANT
BNDG COHESIVE 6X5 TAN STRL LF (GAUZE/BANDAGES/DRESSINGS) ×2 IMPLANT
BOWL CEMENT MIXING ADV NOZZLE (MISCELLANEOUS) IMPLANT
CANISTER SUCT 1200ML W/VALVE (MISCELLANEOUS) ×2 IMPLANT
CANISTER SUCT 3000ML PPV (MISCELLANEOUS) ×4 IMPLANT
CHLORAPREP W/TINT 26 (MISCELLANEOUS) ×4 IMPLANT
COVER WAND RF STERILE (DRAPES) IMPLANT
DECANTER SPIKE VIAL GLASS SM (MISCELLANEOUS) ×4 IMPLANT
DRAPE IMP U-DRAPE 54X76 (DRAPES) ×4 IMPLANT
DRAPE INCISE IOBAN 66X60 STRL (DRAPES) ×2 IMPLANT
DRAPE SHEET LG 3/4 BI-LAMINATE (DRAPES) ×2 IMPLANT
DRAPE SURG 17X11 SM STRL (DRAPES) ×2 IMPLANT
DRAPE SURG 17X23 STRL (DRAPES) ×2 IMPLANT
DRSG OPSITE POSTOP 4X12 (GAUZE/BANDAGES/DRESSINGS) ×2 IMPLANT
DRSG OPSITE POSTOP 4X14 (GAUZE/BANDAGES/DRESSINGS) IMPLANT
DRSG OPSITE POSTOP 4X8 (GAUZE/BANDAGES/DRESSINGS) ×2 IMPLANT
ELECT BLADE 6.5 EXT (BLADE) ×2 IMPLANT
ELECT CAUTERY BLADE 6.4 (BLADE) ×2 IMPLANT
ELECT REM PT RETURN 9FT ADLT (ELECTROSURGICAL) ×2
ELECTRODE REM PT RTRN 9FT ADLT (ELECTROSURGICAL) ×1 IMPLANT
GAUZE PACK 2X3YD (GAUZE/BANDAGES/DRESSINGS) IMPLANT
GLOVE BIO SURGEON STRL SZ8 (GLOVE) ×4 IMPLANT
GLOVE INDICATOR 8.0 STRL GRN (GLOVE) ×2 IMPLANT
GOWN STRL REUS W/ TWL LRG LVL3 (GOWN DISPOSABLE) ×1 IMPLANT
GOWN STRL REUS W/ TWL XL LVL3 (GOWN DISPOSABLE) ×1 IMPLANT
GOWN STRL REUS W/TWL LRG LVL3 (GOWN DISPOSABLE) ×1
GOWN STRL REUS W/TWL XL LVL3 (GOWN DISPOSABLE) ×1
HEAD ENDO II MOD SZ 44 (Orthopedic Implant) ×2 IMPLANT
HOOD PEEL AWAY FLYTE STAYCOOL (MISCELLANEOUS) ×4 IMPLANT
INSERT TAPER ENDO II -6 (Orthopedic Implant) ×2 IMPLANT
IV NS 100ML SINGLE PACK (IV SOLUTION) IMPLANT
LABEL OR SOLS (LABEL) IMPLANT
MAT ABSORB  FLUID 56X50 GRAY (MISCELLANEOUS) ×1
MAT ABSORB FLUID 56X50 GRAY (MISCELLANEOUS) ×1 IMPLANT
NDL SAFETY ECLIPSE 18X1.5 (NEEDLE) ×1 IMPLANT
NEEDLE FILTER BLUNT 18X 1/2SAF (NEEDLE) ×1
NEEDLE FILTER BLUNT 18X1 1/2 (NEEDLE) ×1 IMPLANT
NEEDLE HYPO 18GX1.5 SHARP (NEEDLE) ×1
NEEDLE SPNL 20GX3.5 QUINCKE YW (NEEDLE) ×2 IMPLANT
NS IRRIG 1000ML POUR BTL (IV SOLUTION) ×2 IMPLANT
PACK HIP PROSTHESIS (MISCELLANEOUS) ×2 IMPLANT
PULSAVAC PLUS IRRIG FAN TIP (DISPOSABLE) ×2
SOL .9 NS 3000ML IRR  AL (IV SOLUTION) ×2
SOL .9 NS 3000ML IRR UROMATIC (IV SOLUTION) ×2 IMPLANT
STAPLER SKIN PROX 35W (STAPLE) ×2 IMPLANT
STEM FEMORAL 9X15MM HIP 130D (Stem) ×2 IMPLANT
STRAP SAFETY 5IN WIDE (MISCELLANEOUS) ×2 IMPLANT
SUT ETHIBOND 2 V 37 (SUTURE) ×6 IMPLANT
SUT VIC AB 1 CT1 36 (SUTURE) ×4 IMPLANT
SUT VIC AB 2-0 CT1 (SUTURE) ×6 IMPLANT
SUT VIC AB 2-0 CT1 27 (SUTURE) ×3
SUT VIC AB 2-0 CT1 TAPERPNT 27 (SUTURE) ×3 IMPLANT
SUT VICRYL 1-0 27IN ABS (SUTURE) ×4
SUTURE VICRYL 1-0 27IN ABS (SUTURE) ×2 IMPLANT
SYR 10ML LL (SYRINGE) ×2 IMPLANT
SYR 30ML LL (SYRINGE) ×6 IMPLANT
SYR TB 1ML 27GX1/2 LL (SYRINGE) IMPLANT
TAPE TRANSPORE STRL 2 31045 (GAUZE/BANDAGES/DRESSINGS) ×2 IMPLANT
TIP BRUSH PULSAVAC PLUS 24.33 (MISCELLANEOUS) ×2 IMPLANT
TIP FAN IRRIG PULSAVAC PLUS (DISPOSABLE) ×1 IMPLANT

## 2018-12-01 NOTE — Anesthesia Preprocedure Evaluation (Signed)
Anesthesia Evaluation  Patient identified by MRN, date of birth, ID band Patient awake    Reviewed: Allergy & Precautions, H&P , NPO status , Patient's Chart, lab work & pertinent test results, reviewed documented beta blocker date and time   Airway Mallampati: II   Neck ROM: full    Dental  (+) Poor Dentition   Pulmonary neg pulmonary ROS, former smoker,    Pulmonary exam normal        Cardiovascular Exercise Tolerance: Poor hypertension, On Medications + CAD, + Past MI and +CHF  negative cardio ROS Normal cardiovascular exam Rhythm:regular Rate:Normal     Neuro/Psych CVA negative neurological ROS  negative psych ROS   GI/Hepatic negative GI ROS, Neg liver ROS,   Endo/Other  negative endocrine ROSdiabetes, Well Controlled, Type 1, Insulin Dependent  Renal/GU negative Renal ROS  negative genitourinary   Musculoskeletal   Abdominal   Peds  Hematology negative hematology ROS (+)   Anesthesia Other Findings Past Medical History: No date: Adenocarcinoma in situ of cervix No date: Arthritis     Comment:  hands No date: Breast cancer ([facility]) No date: Colon cancer ([place]) No date: Diabetes mellitus without complication ([place]) No date: Endometrial carcinoma ([place])     Comment:  s/p total abdominal hysterectomy 2014: H/O compression fracture of spine     Comment:  thoracic spine No date: H/O polycythemia vera 09/2014, 03/2015: H/O TIA (transient ischemic attack) and stroke     Comment:  No deficits No date: Hypertension No date: Hyperuricemia No date: Microalbuminuria No date: Myocardial infarction ([place]) No date: Polycythemia vera ([place]) No date: Recurrent falls No date: Skin cancer     Comment:  face, legs 2008: Stroke ([place])     Comment:  no deficits No date: Trochanteric bursitis No date: Varicose veins     Comment:  treated Past Surgical History: No date: ABDOMINAL HYSTERECTOMY 03/28/2015: BOWEL RESECTION;  N/A     Comment:  Procedure: SMALL BOWEL RESECTION;  Surgeon: Leonie Green, MD;  Location: [facility] ORS;  Service: General;              Laterality: N/A; No date: CATARACT EXTRACTION W/ INTRAOCULAR LENS IMPLANT; Right 10/07/2015: CATARACT EXTRACTION W/PHACO; Left     Comment:  Procedure: CATARACT EXTRACTION PHACO AND INTRAOCULAR               LENS PLACEMENT (IOC);  Surgeon: Ronnell Freshwater,              MD;  Location: [place];  Service:               Ophthalmology;  Laterality: Left;  DIABETIC - oral               meds VISION BLUE 10/29/2017: CORONARY ANGIOGRAPHY; N/A     Comment:  Procedure: CORONARY ANGIOGRAPHY;  Surgeon: Dionisio David, MD;  Location: [place] [facility];  Service:               Cardiovascular;  Laterality: N/A; No date: EXPLORATORY LAPAROTOMY     Comment:  for fibroids 10/29/2017: LEFT HEART CATH; Right     Comment:  Procedure: Left Heart Cath;  Surgeon: Dionisio David,               MD;  Location: [place] [facility];  Service:  Cardiovascular;  Laterality: Right; No date: TONSILLECTOMY BMI    Body Mass Index: 17.64 kg/m     Reproductive/Obstetrics negative OB ROS                             Anesthesia Physical Anesthesia Plan  ASA: IV  Anesthesia Plan: General and General ETT   Post-op Pain Management:    Induction:   PONV Risk Score and Plan:   Airway Management Planned:   Additional Equipment:   Intra-op Plan:   Post-operative Plan:   Informed Consent: I have reviewed the patients History and Physical, chart, labs and discussed the procedure including the risks, benefits and alternatives for the proposed anesthesia with the patient or authorized representative who has indicated his/her understanding and acceptance.     Dental Advisory Given  Plan Discussed with: CRNA  Anesthesia Plan Comments: (Pt on plavix and appears optimized for above.  Will  proceed with GOT. JA)        Anesthesia Quick Evaluation

## 2018-12-01 NOTE — Progress Notes (Signed)
Initial Nutrition Assessment  RD working remotely.  DOCUMENTATION CODES:   Underweight  INTERVENTION:  Provide Glucerna Shake po TID with diet advancement, each supplement provides 220 kcal and 10 grams of protein.  Provide daily MVI.  NUTRITION DIAGNOSIS:   Increased nutrient needs related to post-op healing as evidenced by estimated needs.  GOAL:   Patient will meet greater than or equal to 90% of their needs  MONITOR:   PO intake, Supplement acceptance, Diet advancement, Labs, Weight trends, Skin, I & O's  REASON FOR ASSESSMENT:   Malnutrition Screening Tool    ASSESSMENT:   85 year old female with PMHx of HTN, DM, hx TIA, hx CVA, arthritis, endometrial carcinoma s/p total abdominal hysterectomy, hx MI, hx bowel resection who is now admitted with closed left hip fracture.   Patient in OR so unable to provide any nutrition/weight history at this time. Per chart patient has been weight-stable for the past year. She is underweight and is at risk for malnutrition. She would benefit from oral nutrition supplement to help meet calorie/protein needs and promote post-operative healing. When patient has been seen before by RD she reported she likes Glucerna.  Medications reviewed and include: acidophilus, Oscal with D 1 tablet daily, hydroxyurea, Novolog 0-9 units TID, Novolog 0-5 units QHS, Levemir 8 units QHS, pantoprazole, sertraline, spironolactone 12.5 mg daily.  Labs reviewed: CBG 135-210, BUN 32, Creatinine 1.06.  NUTRITION - FOCUSED PHYSICAL EXAM:  Unable to complete at this time.  Diet Order:   Diet Order            Diet NPO time specified Except for: Sips with Meds  Diet effective midnight             EDUCATION NEEDS:   No education needs have been identified at this time  Skin:  Skin Assessment: Reviewed RN Assessment  Last BM:  Unknown/PTA  Height:   Ht Readings from Last 1 Encounters:  11/30/18 5\' 5"  (1.651 m)   Weight:   Wt Readings from Last  1 Encounters:  11/30/18 48.1 kg   Ideal Body Weight:  56.8 kg  BMI:  Body mass index is 17.64 kg/m.  Estimated Nutritional Needs:   Kcal:  1400-1600  Protein:  65-75 grams  Fluid:  1.4-1.6 L/day  Willey Blade, MS, RD, LDN Office: [phone] Pager: [phone] After Hours/Weekend Pager: [phone]

## 2018-12-01 NOTE — Evaluation (Signed)
Physical Therapy Evaluation Patient Details Name: [name] MRN: [id number] DOB: 1932/04/15 Today's Date: 12/01/2018   History of Present Illness  presented to ER secondary to mechanical fall in home environment, acute onset of L hip pain; admitted with displaced L femoral neck fracture, s/p hemiarthroplasty (12/01/18), WBAT, posterior approach  Clinical Impression  Patient resting with eyes open upon entry to room; generally lethargic, but arousable with cuing and agreeable to session.  Speech generally dysarthric with limited articulation/intelligibility at times.  Intermittent confusion noted, but patient with good efforts to follow commands and participate with session.  L LE with significant guarding due to pain, tolerating approx 50% normal ROM.  Currently requiring mod/max assist +2 for bed mobility; min/mod assist +2 for sit/stand and lateral stepping (x3-4 steps) towards L with RW.  Heavy assist for WBing during L LE due to pain.  Additional gait distance deferred due to pain; will continue to assess/progress as appropriate. Would benefit from skilled PT to address above deficits and promote optimal return to PLOF; recommend transition to STR upon discharge from acute hospitalization.     Follow Up Recommendations SNF    Equipment Recommendations       Recommendations for Other Services       Precautions / Restrictions Precautions Precautions: Posterior Hip;Fall Restrictions Weight Bearing Restrictions: Yes LLE Weight Bearing: Weight bearing as tolerated      Mobility  Bed Mobility Overal bed mobility: Needs Assistance Bed Mobility: Supine to Sit;Sit to Supine     Supine to sit: Mod assist;Max assist;+2 for physical assistance Sit to supine: Mod assist;Max assist;+2 for physical assistance      Transfers Overall transfer level: Needs assistance Equipment used: Rolling walker (2 wheeled) Transfers: Sit to/from Stand Sit to Stand: Mod assist;+2 physical  assistance;Min assist         General transfer comment: assist for balance, weight shift  Ambulation/Gait Ambulation/Gait assistance: Mod assist;+2 physical assistance Gait Distance (Feet): 3 Feet Assistive device: Rolling walker (2 wheeled)       General Gait Details: lateral stepping towards L x3-4 steps with RW.  Fair/good abiity to advance/place L LE; hesitant for R LE advancement due to pain in L LE With [employer]            Wheelchair Mobility    Modified Rankin (Stroke Patients Only)       Balance Overall balance assessment: Needs assistance Sitting-balance support: No upper extremity supported;Feet supported Sitting balance-Leahy Scale: Fair     Standing balance support: Bilateral upper extremity supported Standing balance-Leahy Scale: Poor                               Pertinent Vitals/Pain Pain Assessment: Faces Faces Pain Scale: Hurts even more Pain Location: L hip Pain Descriptors / Indicators: Aching;Guarding;Grimacing Pain Intervention(s): Limited activity within patient's tolerance;Monitored during session;Repositioned    Home Living Family/patient expects to be discharged to:: Private residence Living Arrangements: Alone Available Help at Discharge: Personal care attendant(reports 2 caregivers that assist patient at [place]) Type of Home: House Home Access: Stairs to enter Entrance Stairs-Rails: Right Entrance Stairs-Number of Steps: 2 Home Layout: One level Home Equipment: Walker - 2 wheels      Prior Function Level of Independence: Needs assistance         Comments: Ambulatory for household distances with RW, caregiver for assist (24 hour?) as needed     Hand Dominance  Extremity/Trunk Assessment   Upper Extremity Assessment Upper Extremity Assessment: Generalized weakness    Lower Extremity Assessment Lower Extremity Assessment: (L LE grossly 3-/5, limited by pain, very guarded)        Communication   Communication: Expressive difficulties(moderately dysathric)  Cognition Arousal/Alertness: Awake/alert Behavior During Therapy: WFL for tasks assessed/performed Overall Cognitive Status: No family/caregiver present to determine baseline cognitive functioning                                 General Comments: intermittent confusion      General Comments      Exercises Other Exercises Other Exercises: Supine L LE therex, 1x10, act assist ROM: ankle pumps, quad sets, SAQs, heel slides, hip abduct/adduct.  Hand-over-hand assist to initiate and guide due to pain Other Exercises: Sit/stand x2 with RW, min/mod assist   Assessment/Plan    PT Assessment Patient needs continued PT services  PT Problem List Decreased strength;Decreased range of motion;Decreased activity tolerance;Decreased mobility;Decreased balance;Decreased cognition;Decreased safety awareness;Decreased knowledge of use of DME;Decreased knowledge of precautions;Pain;Decreased skin integrity       PT Treatment Interventions DME instruction;Gait training;Stair training;Functional mobility training;Therapeutic activities;Therapeutic exercise;Balance training;Patient/family education    PT Goals (Current goals can be found in the Care Plan section)  Acute Rehab PT Goals Patient Stated Goal: agreeable to session PT Goal Formulation: With patient Time For Goal Achievement: 12/15/18 Potential to Achieve Goals: Fair    Frequency BID   Barriers to discharge        Co-evaluation               AM-PAC PT "6 Clicks" Mobility  Outcome Measure Help needed turning from your back to your side while in a flat bed without using bedrails?: A Lot Help needed moving from lying on your back to sitting on the side of a flat bed without using bedrails?: A Lot Help needed moving to and from a bed to a chair (including a wheelchair)?: A Lot Help needed standing up from a chair using your arms (e.g.,  wheelchair or bedside chair)?: A Lot Help needed to walk in hospital room?: A Lot Help needed climbing 3-5 steps with a railing? : Total 6 Click Score: 11    End of Session Equipment Utilized During Treatment: Gait belt Activity Tolerance: Patient tolerated treatment well;Patient limited by pain Patient left: in bed;with call bell/phone within reach;with bed alarm set Nurse Communication: Mobility status PT Visit Diagnosis: Difficulty in walking, not elsewhere classified (R26.2);Muscle weakness (generalized) (M62.81);Pain Pain - Right/Left: Left Pain - part of body: Hip    Time: 4098-1191 PT Time Calculation (min) (ACUTE ONLY): 30 min   Charges:   PT Evaluation $PT Eval Moderate Complexity: 1 Mod PT Treatments $Therapeutic Exercise: 8-22 mins        Paisley Grajeda H. Owens Shark, PT, DPT, NCS 12/01/18, 7:11 PM [phone]

## 2018-12-01 NOTE — Anesthesia Post-op Follow-up Note (Signed)
Anesthesia QCDR form completed.        

## 2018-12-01 NOTE — Anesthesia Procedure Notes (Signed)
Procedure Name: Intubation Date/Time: 12/01/2018 9:04 AM Performed by: Chanetta Marshall, CRNA Pre-anesthesia Checklist: Patient identified, Emergency Drugs available, Suction available and Patient being monitored Patient Re-evaluated:Patient Re-evaluated prior to induction Oxygen Delivery Method: Circle system utilized Preoxygenation: Pre-oxygenation with 100% oxygen Induction Type: IV induction Ventilation: Mask ventilation without difficulty Laryngoscope Size: McGraph and 3 Tube type: Oral Number of attempts: 1 Airway Equipment and Method: Stylet and Oral airway Placement Confirmation: ETT inserted through vocal cords under direct vision,  positive ETCO2,  breath sounds checked- equal and bilateral and CO2 detector Secured at: 21 cm Tube secured with: Tape Dental Injury: Teeth and Oropharynx as per pre-operative assessment

## 2018-12-01 NOTE — Op Note (Signed)
12/01/2018  10:39 AM  Patient:   [name]  Pre-Op Diagnosis:   Displaced femoral neck fracture, left hip.  Post-Op Diagnosis:   Same.  Procedure:   Left hip unipolar hemiarthroplasty.  Surgeon:   Pascal Lux, MD  Assistant:   Cameron Proud, PA-C  Anesthesia:   GET  Findings:   As above.  There was extensive partial-thickness tearing of the gluteus medius tendon.  Complications:   None  EBL:   75 cc  Fluids:   400 cc crystalloid  UOP:   125 cc  TT:   None  Drains:   None  Closure:   Staples  Implants:   Biomet press-fit system with a #9 laterally offset reduced proximal profile Echo femoral stem, a 44 mm outer diameter shell, and a -6 mm neck adapter.  Brief Clinical Note:   The patient is an 85 year old female who sustained the above-noted injury yesterday when she apparently lost her balance and fell onto her left side at home. X-rays in the emergency room demonstrated a displaced femoral neck fracture. The patient has been cleared medically and presents at this time for definitive management of the injury.  Procedure:   The patient was brought into the operating room and lain in the supine position. After adequate general endotracheal intubation and anesthesia was obtained, the patient was repositioned in the right lateral decubitus position and secured using a lateral hip positioner. The left hip and lower extremity were prepped with ChloroPrep solution before being draped sterilely. Preoperative antibiotics were administered. A timeout was performed to verify the appropriate surgical site before a standard posterior approach to the hip was made through an approximately 4-5 inch incision. The incision was carried down through the subcutaneous tissues to expose the gluteal fascia and proximal end of the iliotibial band. These structures were split the length of the incision and the Charnley self-retaining hip retractor placed. The bursal tissues were swept posteriorly  to expose the short external rotators. The anterior border of the piriformis tendon was identified and this plane developed down through the capsule to enter the joint. Abundant fracture hematoma was suctioned. A flap of tissue was elevated off the posterior aspect of the femoral neck and greater trochanter and retracted posteriorly. This flap included the piriformis tendon, the short external rotators, and the posterior capsule. The femoral head was removed in its entirety, then taken to the back table where it was measured and found to be optimally replicated by a 44 mm head. The appropriate trial head was inserted and found to demonstrate an excellent suction fit.   Attention was directed to the femoral side. The femoral neck was recut 10-12 mm above the lesser trochanter using an oscillating saw. The piriformis fossa was debrided of soft tissues before the intramedullary canal was accessed through this point using a triple step reamer. The canal was reamed sequentially beginning with a #7 tapered reamer and progressing to a #10 tapered reamer. This provided excellent circumferential chatter. A box osteotome was used to establish version before the canal was broached sequentially beginning with a #8 broach and progressing to a #9 broach. This was left in place and several trial reductions performed. The permanent #9 reduced proximal profile femoral stem was impacted into place. A repeat trial reduction was performed using both the -6 mm and -3 mm neck lengths. The -6 mm neck length demonstrated excellent stability both in extension and external rotation as well as with flexion to 90 and internal rotation  beyond 73. It also was stable in the position of sleep. The 44 mm outer diameter shell with the -6 mm neck adapter construct was put together on the back table before being impacted onto the stem of the femoral component. The Morse taper locking mechanism was verified using manual distraction before the head  was relocated and the hip placed through a range of motion with the findings as described above.  The wound was copiously irrigated with bacitracin saline solution via the jet lavage system before the peri-incisional and pericapsular tissues were injected with 30 cc of 0.5% Sensorcaine with epinephrine and 20 cc of Exparel diluted out to 60 cc with normal saline to help with postoperative analgesia. The posterior flap was reapproximated to the posterior aspect of the greater trochanter using #2 Tycron interrupted sutures placed through drill holes. Several additional #2 Tycron interrupted sutures were used to reinforce this layer of closure. The iliotibial band was reapproximated using #1 Vicryl interrupted sutures before the gluteal fascia was closed using a running #1 Vicryl suture. At this point, 1 g of transexemic acid in 10 cc of normal saline was injected into the joint to help reduce postoperative bleeding. The subcutaneous tissues were closed in several layers using 2-0 Vicryl interrupted sutures before the skin was closed using staples. A sterile occlusive dressing was applied to the wound before the patient was placed into an abduction wedge pillow. The patient was then rolled back into the supine position on the hospital bed before being awakened, extubated, and returned to the recovery room in satisfactory condition after tolerating the procedure well.

## 2018-12-01 NOTE — NC FL2 (Signed)
[place] LEVEL OF CARE SCREENING TOOL     IDENTIFICATION  Patient Name: [name] Birthdate: [birth year] Sex: female Admission Date (Current Location): 11/30/2018  [facility] [place] Number:  Engineering geologist and Address:         Provider Number: [id number]  Attending Physician Name and Address:  Loletha Grayer, MD  Relative Name and Phone Number:       Current Level of Care: Hospital Recommended Level of Care: [place] Prior Approval Number:    Date Approved/Denied:   PASRR Number: [id number]  Discharge Plan: SNF    Current Diagnoses: Patient Active Problem List   Diagnosis Date Noted  . Closed left hip fracture ([place]) 11/30/2018  . Liver lesion 04/22/2018  . Goals of care, counseling/discussion 04/22/2018  . Cognitive deficit, post-stroke 12/31/2017  . Ataxia, post-stroke 12/31/2017  . Dysarthria, post-stroke   . Gait disturbance, post-stroke   . H/O cerebral parenchymal hemorrhage 11/04/2017  . Essential hypertension 11/04/2017  . Hyperlipidemia 11/04/2017  . Diabetes ([place]) 11/04/2017  . CAD (coronary artery disease) 11/04/2017  . 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 Hypokalemia 11/04/2017  . 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 History of cervical cancer   . History of TIA (transient ischemic attack)   . Acute systolic congestive heart failure ([place])   . Reactive hypertension   . Hypernatremia   . Leukocytosis   . Acute blood loss anemia   . Elevated serum creatinine   . Acute respiratory failure with hypoxia ([place])   . IVH (intraventricular hemorrhage) ([place]) 10/29/2017  . Hypoxia 10/28/2017  . Pancreatic lesion 05/24/2017  . 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 Nodule of upper lobe of left lung 06/04/2015  . 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 Cerebral thrombosis with cerebral infarction 04/03/2015  . Cancer of right colon ([place])  03/28/2015  . CVA (cerebral infarction) 12/21/2014  . Polycythemia vera ([place]) 06/22/2006    Orientation RESPIRATION BLADDER Height & Weight     Self, Time, Situation, Place  O2(2 Liters Oxygen.) Incontinent Weight: 106 lb (48.1 kg) Height:  5\' 5"  (165.1 cm)  BEHAVIORAL SYMPTOMS/MOOD NEUROLOGICAL BOWEL NUTRITION STATUS      Continent Diet(Diet: NPO for surgery.)  AMBULATORY STATUS COMMUNICATION OF NEEDS Skin   Extensive Assist Verbally Surgical wounds(Left Hip)                       Personal Care Assistance Level of Assistance  Bathing, Feeding, Dressing Bathing Assistance: Limited assistance Feeding assistance: Independent Dressing Assistance: Limited assistance     Functional Limitations Info  Sight, Hearing, Speech Sight Info: Impaired Hearing Info: Impaired Speech Info: Adequate    SPECIAL CARE FACTORS FREQUENCY  PT (By licensed PT), OT (By licensed OT)     PT Frequency: 5 OT Frequency: 5            Contractures      Additional Factors Info  Code Status, Allergies Code Status Info: DNR Allergies Info: Simvastatin           Current Medications (12/01/2018):  This is the current hospital active medication list Current Facility-Administered Medications  Medication Dose Route Frequency Provider Last Rate Last Dose  . [MAR Hold] acetaminophen (TYLENOL) tablet 650 mg  650 mg Oral Q6H PRN Loletha Grayer, MD       Or  . Doug Sou Hold] acetaminophen (TYLENOL) suppository 650 mg  650 mg Rectal Q6H  PRN Loletha Grayer, MD      . Doug Sou Hold] acidophilus (RISAQUAD) capsule 1 capsule  1 capsule Oral Daily Wieting, Richard, MD      . Doug Sou Hold] calcium-vitamin D (OSCAL WITH D) 500-200 MG-UNIT per tablet 1 tablet  1 tablet Oral Daily Wieting, Richard, MD      . Doug Sou Hold] carvedilol (COREG) tablet 12.5 mg  12.5 mg Oral BID WC Loletha Grayer, MD   12.5 mg at 12/01/18 0810  . [MAR Hold] docusate sodium (COLACE) capsule 200 mg  200 mg Oral Daily PRN Loletha Grayer,  MD      . Doug Sou Hold] hydroxyurea (HYDREA) capsule 500 mg  500 mg Oral Once per day on Sun Mon Tue Wed Thu Fri Loletha Grayer, MD      . Doug Sou Hold] insulin aspart (novoLOG) injection 0-5 Units  0-5 Units Subcutaneous QHS Wieting, Richard, MD      . Doug Sou Hold] insulin aspart (novoLOG) injection 0-9 Units  0-9 Units Subcutaneous TID WC Loletha Grayer, MD   1 Units at 12/01/18 0815  . [MAR Hold] insulin detemir (LEVEMIR) injection 8 Units  8 Units Subcutaneous QHS Loletha Grayer, MD   8 Units at 11/30/18 2243  . [MAR Hold] loratadine (CLARITIN) tablet 10 mg  10 mg Oral Daily Wieting, Delfino Lovett, MD      . Doug Sou Hold] morphine 2 MG/ML injection 1 mg  1 mg Intravenous Q4H PRN Loletha Grayer, MD   1 mg at 11/30/18 1722  . [MAR Hold] mupirocin ointment (BACTROBAN) 2 % 1 application  1 application Nasal BID Loletha Grayer, MD      . Doug Sou Hold] nitroGLYCERIN (NITRODUR - Dosed in mg/24 hr) patch 0.1 mg  0.1 mg Transdermal Daily Loletha Grayer, MD   0.1 mg at 11/30/18 1810  . [MAR Hold] oxyCODONE (Oxy IR/ROXICODONE) immediate release tablet 5 mg  5 mg Oral Q4H PRN Loletha Grayer, MD      . Doug Sou Hold] pantoprazole (PROTONIX) EC tablet 40 mg  40 mg Oral Daily Wieting, Richard, MD      . Doug Sou Hold] pravastatin (PRAVACHOL) tablet 20 mg  20 mg Oral QHS Loletha Grayer, MD   20 mg at 11/30/18 2242  . [MAR Hold] ramipril (ALTACE) capsule 5 mg  5 mg Oral BID Loletha Grayer, MD   5 mg at 11/30/18 2242  . [MAR Hold] sertraline (ZOLOFT) tablet 25 mg  25 mg Oral Daily Wieting, Richard, MD      . Doug Sou Hold] spironolactone (ALDACTONE) tablet 12.5 mg  12.5 mg Oral Daily Loletha Grayer, MD       Facility-Administered Medications Ordered in Other Encounters  Medication Dose Route Frequency Provider Last Rate Last Dose  . fentaNYL (SUBLIMAZE) injection    Anesthesia Intra-op Disser, Einar Grad, CRNA   50 mcg at 12/01/18 0901  . lactated ringers infusion    Continuous PRN Disser, Einar Grad, CRNA      . lidocaine  (cardiac) 100 mg/12mL (XYLOCAINE) injection 2%   Intravenous Anesthesia Intra-op Disser, Einar Grad, CRNA   60 mg at 12/01/18 0901  . phenylephrine (NEO-SYNEPHRINE) injection   Intravenous Anesthesia Intra-op Disser, Einar Grad, CRNA   100 mcg at 12/01/18 0926  . propofol (DIPRIVAN) 10 mg/mL bolus/IV push    Anesthesia Intra-op Disser, Einar Grad, CRNA   60 mg at 12/01/18 0901  . rocuronium [facility]) injection    Anesthesia Intra-op Disser, Einar Grad, CRNA   40 mg at 12/01/18 0901     Discharge Medications: Please  see discharge summary for a list of discharge medications.  Relevant Imaging Results:  Relevant Lab Results:   Additional Information SSN: [id number]  Kealie Barrie, Veronia Beets, LCSW

## 2018-12-01 NOTE — Progress Notes (Signed)
Patient ID: [name], female   DOB: 05/12/1932, 85 y.o.   MRN: [id number]  [facility] PROGRESS NOTE  [name] MHD:[id number] DOB: 1931-09-26 DOA: 11/30/2018 PCP: Baxter Hire, MD  HPI/Subjective: Patient feeling okay.  Still having some pain in the hip.  Going to the operating room today  Objective: Vitals:   11/30/18 2242 12/01/18 0012  BP: 138/82 130/78  Pulse: 84 85  Resp: 20 19  Temp: 98.2 F (36.8 C) 98.3 F (36.8 C)  SpO2: 96% 97%    Filed Weights   11/30/18 1111  Weight: 48.1 kg    ROS: Review of Systems  Constitutional: Negative for chills and fever.  Eyes: Negative for blurred vision.  Respiratory: Negative for cough and shortness of breath.   Cardiovascular: Negative for chest pain.  Gastrointestinal: Negative for abdominal pain, constipation, diarrhea, nausea and vomiting.  Genitourinary: Negative for dysuria.  Musculoskeletal: Positive for joint pain.  Neurological: Negative for dizziness and headaches.   Exam: Physical Exam  HENT:  Nose: No mucosal edema.  Mouth/Throat: No oropharyngeal exudate or posterior oropharyngeal edema.  Eyes: Pupils are equal, round, and reactive to light. Conjunctivae, EOM and lids are normal.  Neck: No JVD present. Carotid bruit is not present. No edema present. No thyroid mass and no thyromegaly present.  Cardiovascular: S1 normal and S2 normal. Exam reveals no gallop.  No murmur heard. Pulses:      Dorsalis pedis pulses are 2+ on the right side and 2+ on the left side.  Respiratory: No respiratory distress. She has decreased breath sounds in the right lower field and the left lower field. She has no wheezes. She has no rhonchi. She has no rales.  GI: Soft. Bowel sounds are normal. There is no abdominal tenderness.  Musculoskeletal:     Right ankle: She exhibits no swelling.     Left ankle: She exhibits no swelling.     Comments: Left leg shortened and externally rotated  Lymphadenopathy:    She has no  cervical adenopathy.  Neurological: She is alert. No cranial nerve deficit.  Skin: Skin is warm. Nails show no clubbing.  Bruising on arms  Psychiatric: She has a normal mood and affect.      Data Reviewed: Basic Metabolic Panel: Recent Labs  Lab 11/30/18 1314 12/01/18 0303  NA 137 137  K 4.3 4.2  CL 103 102  CO2 25 27  GLUCOSE 224* 205*  BUN 32* 32*  CREATININE 1.07* 1.06*  CALCIUM 8.9 8.9   CBC: Recent Labs  Lab 11/30/18 1314 12/01/18 0303  WBC 15.8* 12.7*  NEUTROABS 14.2*  --   HGB 14.3 13.5  HCT 44.5 42.5  MCV 94.7 96.6  PLT 445* 402*    CBG: Recent Labs  Lab 11/30/18 1713 11/30/18 2233  GLUCAP 210* 171*    Recent Results (from the past 240 hour(s))  SARS Coronavirus 2 [facility] order, Performed in [place] hospital lab)     Status: None   Collection Time: 11/30/18  3:14 PM   Specimen: Nasopharyngeal Swab  Result Value Ref Range Status   SARS Coronavirus 2 NEGATIVE NEGATIVE Final    Comment: (NOTE) If result is NEGATIVE SARS-CoV-2 target nucleic acids are NOT DETECTED. The SARS-CoV-2 RNA is generally detectable in upper and lower  respiratory specimens during the acute phase of infection. The lowest  concentration of SARS-CoV-2 viral copies this assay can detect is 250  copies / mL. A negative result does not preclude SARS-CoV-2 infection  and should not be used as the sole basis for treatment or other  patient management decisions.  A negative result may occur with  improper specimen collection / handling, submission of specimen other  than nasopharyngeal swab, presence of viral mutation(s) within the  areas targeted by this assay, and inadequate number of viral copies  (<250 copies / mL). A negative result must be combined with clinical  observations, patient history, and epidemiological information. If result is POSITIVE SARS-CoV-2 target nucleic acids are DETECTED. The SARS-CoV-2 RNA is generally detectable in upper and lower  respiratory  specimens dur ing the acute phase of infection.  Positive  results are indicative of active infection with SARS-CoV-2.  Clinical  correlation with patient history and other diagnostic information is  necessary to determine patient infection status.  Positive results do  not rule out bacterial infection or co-infection with other viruses. If result is PRESUMPTIVE POSTIVE SARS-CoV-2 nucleic acids MAY BE PRESENT.   A presumptive positive result was obtained on the submitted specimen  and confirmed on repeat testing.  While 2019 novel coronavirus  (SARS-CoV-2) nucleic acids may be present in the submitted sample  additional confirmatory testing may be necessary for epidemiological  and / or clinical management purposes  to differentiate between  SARS-CoV-2 and other Sarbecovirus currently known to infect humans.  If clinically indicated additional testing with an alternate test  methodology [phone]) is advised. The SARS-CoV-2 RNA is generally  detectable in upper and lower respiratory sp ecimens during the acute  phase of infection. The expected result is Negative. Fact Sheet for Patients:  StrictlyIdeas.no Fact Sheet for Healthcare Providers: BankingDealers.co.za This test is not yet approved or cleared by the [place] FDA and has been authorized for detection and/or diagnosis of SARS-CoV-2 by FDA under an Emergency Use Authorization (EUA).  This EUA will remain in effect (meaning this test can be used) for the duration of the COVID-19 declaration under Section 564(b)(1) of the Act, 21 U.S.C. section 360bbb-3(b)(1), unless the authorization is terminated or revoked sooner. Performed at [facility], [address] [place], [place] [address]   Surgical PCR screen     Status: None   Collection Time: 11/30/18  4:35 PM   Specimen: Urine, Catheterized; Nasal Swab  Result Value Ref Range Status   MRSA, PCR NEGATIVE NEGATIVE  Final   Staphylococcus aureus NEGATIVE NEGATIVE Final    Comment: (NOTE) The Xpert SA Assay (FDA approved for NASAL specimens in patients 44 years of age and older), is one component of a comprehensive surveillance program. It is not intended to diagnose infection nor to guide or monitor treatment. Performed at [facility], [address] [place], [place] [address]      Studies: Ct Head Wo Contrast  Result Date: 11/30/2018 CLINICAL DATA:  Head trauma, mechanical fall, denies striking head, denies loss of consciousness, slurred speech, chronic, history stroke, hypertension, breast cancer, colon cancer, diabetes mellitus EXAM: CT HEAD WITHOUT CONTRAST CT CERVICAL SPINE WITHOUT CONTRAST TECHNIQUE: Multidetector CT imaging of the head and cervical spine was performed following the standard protocol without intravenous contrast. Multiplanar CT image reconstructions of the cervical spine were also generated. COMPARISON:  09/27/2018 Correlation: PET-CT 04/27/2018 FINDINGS: CT HEAD FINDINGS Brain: Generalized atrophy. Normal ventricular morphology. No midline shift or mass effect. Small vessel chronic ischemic changes of deep cerebral white matter. No intracranial hemorrhage, mass lesion, evidence of acute infarction, or extra-axial fluid collection. Vascular: No hyperdense vessels. Minimal atherosclerotic calcification of internal carotid arteries at  skull base Skull: Intact Sinuses/Orbits: Partial opacification of ethmoid air cells and LEFT mastoid air cells. Remaining visualized paranasal sinuses and RIGHT mastoid air cells clear Other: N/A CT CERVICAL SPINE FINDINGS Alignment: Normal Skull base and vertebrae: Osseous demineralization. Skull base intact. Multilevel facet degenerative changes. Degenerative disc disease changes of the caudal cervical spine. Significant endplate spur formation and uncovertebral spurring at C5-C6 and C6-C7 narrow the LEFT neural foramina. No fracture, subluxation  or bone destruction. Soft tissues and spinal canal: Prevertebral soft tissues normal thickness Disc levels:  No significant abnormalities Upper chest: 9 mm focus of ground-glass opacity at LEFT apex unchanged since 04/27/2018. Remaining apices clear. Other: N/A IMPRESSION: Atrophy with small vessel chronic ischemic changes of deep cerebral white matter. No acute intracranial abnormalities. Degenerative disc and facet disease changes of the cervical spine. No acute cervical spine abnormalities. 9 mm focus of ground-glass opacity at LEFT apex, stable since 04/27/2018, when it demonstrated no worrisome FDG accumulation. Electronically Signed   By: Lavonia Dana M.D.   On: 11/30/2018 13:07   Ct Cervical Spine Wo Contrast  Result Date: 11/30/2018 CLINICAL DATA:  Head trauma, mechanical fall, denies striking head, denies loss of consciousness, slurred speech, chronic, history stroke, hypertension, breast cancer, colon cancer, diabetes mellitus EXAM: CT HEAD WITHOUT CONTRAST CT CERVICAL SPINE WITHOUT CONTRAST TECHNIQUE: Multidetector CT imaging of the head and cervical spine was performed following the standard protocol without intravenous contrast. Multiplanar CT image reconstructions of the cervical spine were also generated. COMPARISON:  09/27/2018 Correlation: PET-CT 04/27/2018 FINDINGS: CT HEAD FINDINGS Brain: Generalized atrophy. Normal ventricular morphology. No midline shift or mass effect. Small vessel chronic ischemic changes of deep cerebral white matter. No intracranial hemorrhage, mass lesion, evidence of acute infarction, or extra-axial fluid collection. Vascular: No hyperdense vessels. Minimal atherosclerotic calcification of internal carotid arteries at skull base Skull: Intact Sinuses/Orbits: Partial opacification of ethmoid air cells and LEFT mastoid air cells. Remaining visualized paranasal sinuses and RIGHT mastoid air cells clear Other: N/A CT CERVICAL SPINE FINDINGS Alignment: Normal Skull base and  vertebrae: Osseous demineralization. Skull base intact. Multilevel facet degenerative changes. Degenerative disc disease changes of the caudal cervical spine. Significant endplate spur formation and uncovertebral spurring at C5-C6 and C6-C7 narrow the LEFT neural foramina. No fracture, subluxation or bone destruction. Soft tissues and spinal canal: Prevertebral soft tissues normal thickness Disc levels:  No significant abnormalities Upper chest: 9 mm focus of ground-glass opacity at LEFT apex unchanged since 04/27/2018. Remaining apices clear. Other: N/A IMPRESSION: Atrophy with small vessel chronic ischemic changes of deep cerebral white matter. No acute intracranial abnormalities. Degenerative disc and facet disease changes of the cervical spine. No acute cervical spine abnormalities. 9 mm focus of ground-glass opacity at LEFT apex, stable since 04/27/2018, when it demonstrated no worrisome FDG accumulation. Electronically Signed   By: Lavonia Dana M.D.   On: 11/30/2018 13:07   Dg Shoulder Left  Result Date: 11/30/2018 CLINICAL DATA:  Pain after fall EXAM: LEFT SHOULDER - 2+ VIEW COMPARISON:  September 27, 2018 FINDINGS: Again noted is an impacted fracture of the proximal left humerus through the neck and greater tuberosity. There is incomplete associated callus formation. No other fractures noted. No dislocation identified. IMPRESSION: Again noted is an impacted fracture through the left humeral neck and greater tubercle. There is associated callus formation although incomplete. The displacement is mildly increased compared to the April 2020 study but there appears to be bridging callus formation which would argue against an acute on chronic fracture. Recommend  clinical correlation. Electronically Signed   By: Dorise Bullion III M.D   On: 11/30/2018 12:37   Dg Hip Unilat With Pelvis 2-3 Views Left  Result Date: 11/30/2018 CLINICAL DATA:  Left hip pain after fall. EXAM: DG HIP (WITH OR WITHOUT PELVIS) 2-3V LEFT  COMPARISON:  None. FINDINGS: There is a displaced subcapital fracture through the proximal left femur. The lateral view is limited but there is no convincing evidence of dislocation based on the frontal views. The right hip is intact. The pelvic bones are otherwise unremarkable. IMPRESSION: Displaced subcapital fracture in the proximal left femur/hip as above. Electronically Signed   By: Dorise Bullion III M.D   On: 11/30/2018 12:34    Scheduled Meds: . acidophilus  1 capsule Oral Daily  . calcium-vitamin D  1 tablet Oral Daily  . carvedilol  12.5 mg Oral BID WC  . hydroxyurea  500 mg Oral Once per day on Sun Mon Tue Wed Thu Fri  . insulin aspart  0-5 Units Subcutaneous QHS  . insulin aspart  0-9 Units Subcutaneous TID WC  . insulin detemir  8 Units Subcutaneous QHS  . loratadine  10 mg Oral Daily  . mupirocin ointment  1 application Nasal BID  . nitroGLYCERIN  0.1 mg Transdermal Daily  . pantoprazole  40 mg Oral Daily  . pravastatin  20 mg Oral QHS  . ramipril  5 mg Oral BID  . sertraline  25 mg Oral Daily  . spironolactone  12.5 mg Oral Daily   Continuous Infusions: .  ceFAZolin (ANCEF) IV      Assessment/Plan:  1. Closed left hip fracture requiring operative repair.  Still no contraindications to surgery at this time.  Patient was asking if she can go home after surgery because she has 24/7 care.  We will see how she does postoperatively before making that decision. 2.  History of stroke, CAD and ischemic cardiomyopathy.  Plavix on hold for procedure.  Continue low-dose Coreg and statin. 3.  Accelerated hypertension on presentation secondary to pain.  Will hold Norvasc at this time and continue other antihypertensive medications. 4.  Type 2 diabetes mellitus.  Since I thought surgery was going to be last night I ordered Lantus at her usual dose and sliding scale.  Need to watch sugars closely prior to operation today 5.  Hyperlipidemia unspecified on Pravachol 6.  Polycythemia  vera.  Continue Hydrea 7.  Depression on Zoloft     Code Status:     Code Status Orders  (From admission, onward)         Start     Ordered   11/30/18 1438  Do not attempt resuscitation (DNR)  Continuous    Question Answer Comment  In the event of cardiac or respiratory ARREST Do not call a "code blue"   In the event of cardiac or respiratory ARREST Do not perform Intubation, CPR, defibrillation or ACLS   In the event of cardiac or respiratory ARREST Use medication by any route, position, wound care, and other measures to relive pain and suffering. May use oxygen, suction and manual treatment of airway obstruction as needed for comfort.   Comments nurse may pronounce      11/30/18 1438        Code Status History    Date Active Date Inactive Code Status Order ID Comments User Context   11/04/2017 1556 11/17/2017 1805 Partial Code [id number]  Elizabeth Sauer Inpatient   11/04/2017 1556 11/04/2017 1556 Full Code  [id number]  Cathlyn Parsons, PA-C Inpatient   10/30/2017 0139 11/04/2017 1536 Partial Code [id number]  Aroor, Lanice Schwab, MD Inpatient   10/29/2017 2025 10/30/2017 0139 Full Code [id number]  Aroor, Lanice Schwab, MD Inpatient   10/28/2017 2153 10/29/2017 1906 Full Code [id number]  Saundra Shelling, MD Inpatient   03/28/2015 1930 04/03/2015 1642 Full Code [id number]  Leonie Green, MD Inpatient   12/20/2014 1853 12/23/2014 1752 Full Code [id number]  Vaughan Basta, MD Inpatient   Advance Care Planning Activity    Advance Directive Documentation     Most Recent Value  Type of Advance Directive  Healthcare Power of Attorney  Pre-existing out of facility DNR order (yellow form or pink MOST form)  -  "MOST" Form in Place?  -     Family Communication: Spoke with POA on the phone Disposition Plan: Patient interested in going home after the surgery.  We will see how things go.  Most patients require rehab after hip surgery.  Consultants:  Orthopedic surgery  Cardiology  consultation requested by anesthesia  Time spent: 27 minutes, case discussed with POA on the phone.  Jonel Sick [employer]

## 2018-12-01 NOTE — Transfer of Care (Signed)
Immediate Anesthesia Transfer of Care Note  Patient: [name]  Procedure(s) Performed: ARTHROPLASTY BIPOLAR HIP (HEMIARTHROPLASTY) (Left )  Patient Location: PACU  Anesthesia Type:General  Level of Consciousness: awake, alert  and oriented  Airway & Oxygen Therapy: Patient Spontanous Breathing and Patient connected to face mask oxygen  Post-op Assessment: Report given to RN and Post -op Vital signs reviewed and stable  Post vital signs: Reviewed and stable  Last Vitals:  Vitals Value Taken Time  BP    Temp    Pulse    Resp    SpO2      Last Pain:  Vitals:   12/01/18 0810  TempSrc:   PainSc: 0-No pain         Complications: No apparent anesthesia complications

## 2018-12-02 ENCOUNTER — Encounter: Payer: Self-pay | Admitting: Surgery

## 2018-12-02 ENCOUNTER — Inpatient Hospital Stay: Payer: Medicare Other

## 2018-12-02 DIAGNOSIS — Z96649 Presence of unspecified artificial hip joint: Secondary | ICD-10-CM | POA: Insufficient documentation

## 2018-12-02 LAB — BASIC METABOLIC PANEL
Anion gap: 5 (ref 5–15)
BUN: 33 mg/dL — ABNORMAL HIGH (ref 8–23)
CO2: 24 mmol/L (ref 22–32)
Calcium: 7.7 mg/dL — ABNORMAL LOW (ref 8.9–10.3)
Chloride: 104 mmol/L (ref 98–111)
Creatinine, Ser: 1.32 mg/dL — ABNORMAL HIGH (ref 0.44–1.00)
GFR calc Af Amer: 43 mL/min — ABNORMAL LOW (ref >60)
GFR calc non Af Amer: 37 mL/min — ABNORMAL LOW (ref >60)
Glucose, Bld: 222 mg/dL — ABNORMAL HIGH (ref 70–99)
Potassium: 3.6 mmol/L (ref 3.5–5.1)
Sodium: 133 mmol/L — ABNORMAL LOW (ref 135–145)

## 2018-12-02 LAB — CBC WITH DIFFERENTIAL/PLATELET
Abs Immature Granulocytes: 0.16 10*3/uL — ABNORMAL HIGH (ref 0.00–0.07)
Basophils Absolute: 0.1 10*3/uL (ref 0.0–0.1)
Basophils Relative: 0 %
Eosinophils Absolute: 0 10*3/uL (ref 0.0–0.5)
Eosinophils Relative: 0 %
HCT: 33 % — ABNORMAL LOW (ref 36.0–46.0)
Hemoglobin: 10.4 g/dL — ABNORMAL LOW (ref 12.0–15.0)
Immature Granulocytes: 1 %
Lymphocytes Relative: 4 %
Lymphs Abs: 0.5 10*3/uL — ABNORMAL LOW (ref 0.7–4.0)
MCH: 30.1 pg (ref 26.0–34.0)
MCHC: 31.5 g/dL (ref 30.0–36.0)
MCV: 95.7 fL (ref 80.0–100.0)
Monocytes Absolute: 1.4 10*3/uL — ABNORMAL HIGH (ref 0.1–1.0)
Monocytes Relative: 13 %
Neutro Abs: 9.1 10*3/uL — ABNORMAL HIGH (ref 1.7–7.7)
Neutrophils Relative %: 82 %
Platelets: 379 10*3/uL (ref 150–400)
RBC: 3.45 MIL/uL — ABNORMAL LOW (ref 3.87–5.11)
RDW: 15.8 % — ABNORMAL HIGH (ref 11.5–15.5)
WBC: 11.2 10*3/uL — ABNORMAL HIGH (ref 4.0–10.5)
nRBC: 0 % (ref 0.0–0.2)

## 2018-12-02 LAB — GLUCOSE, CAPILLARY
Glucose-Capillary: 190 mg/dL — ABNORMAL HIGH (ref 70–99)
Glucose-Capillary: 156 mg/dL — ABNORMAL HIGH (ref 70–99)
Glucose-Capillary: 156 mg/dL — ABNORMAL HIGH (ref 70–99)
Glucose-Capillary: 108 mg/dL — ABNORMAL HIGH (ref 70–99)

## 2018-12-02 LAB — SURGICAL PATHOLOGY

## 2018-12-02 MED ORDER — CARVEDILOL 3.125 MG PO TABS
6.2500 mg | ORAL_TABLET | Freq: Two times a day (BID) | ORAL | Status: DC
  Administered 2018-12-03 – 2018-12-04 (×3): 6.25 mg via ORAL
  Filled 2018-12-02 (×4): qty 2

## 2018-12-02 MED ORDER — HALOPERIDOL LACTATE 5 MG/ML IJ SOLN
1.0000 mg | Freq: Four times a day (QID) | INTRAMUSCULAR | Status: DC | PRN
  Administered 2018-12-02 – 2018-12-03 (×2): 1 mg via INTRAVENOUS
  Filled 2018-12-02 (×3): qty 1

## 2018-12-02 MED ORDER — CLOPIDOGREL BISULFATE 75 MG PO TABS
75.0000 mg | ORAL_TABLET | Freq: Every day | ORAL | Status: DC
  Administered 2018-12-03 – 2018-12-04 (×2): 75 mg via ORAL
  Filled 2018-12-02 (×4): qty 1

## 2018-12-02 MED ORDER — QUETIAPINE FUMARATE 25 MG PO TABS
12.5000 mg | ORAL_TABLET | Freq: Every day | ORAL | Status: DC
  Filled 2018-12-02: qty 1

## 2018-12-02 MED ORDER — HALOPERIDOL LACTATE 5 MG/ML IJ SOLN: 2.0000 mg | Freq: Once | INTRAMUSCULAR | Status: DC

## 2018-12-02 MED ORDER — ENOXAPARIN SODIUM 30 MG/0.3ML ~~LOC~~ SOLN
30.0000 mg | SUBCUTANEOUS | Status: DC
  Administered 2018-12-03 – 2018-12-04 (×2): 30 mg via SUBCUTANEOUS
  Filled 2018-12-02 (×3): qty 0.3

## 2018-12-02 NOTE — Progress Notes (Signed)
PT Cancellation Note  Patient Details Name: [name] MRN: [id number] DOB: September 19, 1931   Cancelled Treatment:    Reason Eval/Treat Not Completed: (Treatment session attempted.  Patient with increased agitation this date-grabbing, pulling, yelling at therapist despite attempts to calm and encourage repositioning/mobility. Unable to participate with session at this time.  Will continue efforts as appropriate.  Of note, patient repositioned in bed and abduct wedge placed for protection to L LE.)   Barbera Perritt H. Owens Shark, PT, DPT, NCS 12/02/18, 10:09 AM [phone]

## 2018-12-02 NOTE — Anesthesia Postprocedure Evaluation (Signed)
Anesthesia Post Note  Patient: [name]  Procedure(s) Performed: ARTHROPLASTY BIPOLAR HIP (HEMIARTHROPLASTY) (Left )  Patient location during evaluation: PACU Anesthesia Type: General Level of consciousness: awake and alert Pain management: pain level controlled Vital Signs Assessment: post-procedure vital signs reviewed and stable Respiratory status: spontaneous breathing, nonlabored ventilation, respiratory function stable and patient connected to nasal cannula oxygen Cardiovascular status: blood pressure returned to baseline and stable Postop Assessment: no apparent nausea or vomiting Anesthetic complications: no     Last Vitals:  Vitals:   12/02/18 0344 12/02/18 0821  BP: 115/67 118/70  Pulse: 79 85  Resp: 18   Temp: (!) 36.2 C 36.7 C  SpO2: 95% 94%    Last Pain:  Vitals:   12/02/18 0821  TempSrc: Oral  PainSc:                  Molli Barrows

## 2018-12-02 NOTE — Progress Notes (Signed)
PHARMACIST - PHYSICIAN COMMUNICATION  CONCERNING:  Enoxaparin (Lovenox) for DVT Prophylaxis    RECOMMENDATION: Patient was prescribed enoxaprin 40mg  q24 hours for VTE prophylaxis.   Filed Weights   11/30/18 1111  Weight: 106 lb (48.1 kg)    Body mass index is 17.64 kg/m.  Estimated Creatinine Clearance: 24.1 mL/min (A) (by C-G formula based on SCr of 1.32 mg/dL (H)).  Patient is candidate for enoxaparin 30mg  every 24 hours based on CrCl <15ml/min  DESCRIPTION: Pharmacy has adjusted enoxaparin dose per [facility] policy.  Patient is now receiving enoxaparin 30mg  every 24 hours.  Lu Duffel, PharmD, BCPS Clinical Pharmacist 12/02/2018 8:06 AM

## 2018-12-02 NOTE — Care Management Important Message (Signed)
Important Message  Patient Details  Name: [name] MRN: [id number] Date of Birth: [birth year]   Medicare Important Message Given:  Other (see comment) Patient upset and not able to sign today.   [name] 12/02/2018, 11:01 AM

## 2018-12-02 NOTE — Progress Notes (Signed)
Patient ID: [name], female   DOB: 1932/03/27, 85 y.o.   MRN: [id number]  [facility] PROGRESS NOTE  [name] WIO:[id number] DOB: [birth year] DOA: 11/30/2018 PCP: Baxter Hire, MD  HPI/Subjective: Patient woke up when I came into the room.  She was stuttering a little bit more than previous.  Difficult to understand but did answer some questions yes or no.  Little later in the morning with the nursing staff she was more agitated and did not take her medications.  Objective: Vitals:   12/02/18 0344 12/02/18 0821  BP: 115/67 118/70  Pulse: 79 85  Resp: 18   Temp: (!) 97.2 F (36.2 C) 98 F (36.7 C)  SpO2: 95% 94%    Filed Weights   11/30/18 1111  Weight: 48.1 kg    ROS: Review of Systems  Unable to perform ROS: Acuity of condition  Respiratory: Negative for cough and shortness of breath.   Cardiovascular: Negative for chest pain.  Gastrointestinal: Negative for abdominal pain.  Musculoskeletal: Positive for joint pain.   Exam: Physical Exam  HENT:  Nose: No mucosal edema.  Mouth/Throat: No oropharyngeal exudate or posterior oropharyngeal edema.  Eyes: Pupils are equal, round, and reactive to light. Conjunctivae, EOM and lids are normal.  Neck: No JVD present. Carotid bruit is not present. No edema present. No thyroid mass and no thyromegaly present.  Cardiovascular: S1 normal and S2 normal. Exam reveals no gallop.  No murmur heard. Pulses:      Dorsalis pedis pulses are 2+ on the right side and 2+ on the left side.  Respiratory: No respiratory distress. She has decreased breath sounds in the right lower field and the left lower field. She has no wheezes. She has no rhonchi. She has no rales.  GI: Soft. Bowel sounds are normal. There is no abdominal tenderness.  Musculoskeletal:     Right ankle: She exhibits no swelling.     Left ankle: She exhibits no swelling.     Comments: Left leg shortened and externally rotated  Lymphadenopathy:    She has no  cervical adenopathy.  Neurological: She is alert. No cranial nerve deficit.  Skin: Skin is warm. Nails show no clubbing.  Bruising on arms  Psychiatric: She has a normal mood and affect.      Data Reviewed: Basic Metabolic Panel: Recent Labs  Lab 11/30/18 1314 12/01/18 0303 12/02/18 0558  NA 137 137 133*  K 4.3 4.2 3.6  CL 103 102 104  CO2 25 27 24   GLUCOSE 224* 205* 222*  BUN 32* 32* 33*  CREATININE 1.07* 1.06* 1.32*  CALCIUM 8.9 8.9 7.7*   CBC: Recent Labs  Lab 11/30/18 1314 12/01/18 0303 12/02/18 0558  WBC 15.8* 12.7* 11.2*  NEUTROABS 14.2*  --  9.1*  HGB 14.3 13.5 10.4*  HCT 44.5 42.5 33.0*  MCV 94.7 96.6 95.7  PLT 445* 402* 379    CBG: Recent Labs  Lab 12/01/18 1045 12/01/18 1434 12/01/18 1643 12/01/18 2108 12/02/18 0820  GLUCAP 122* 164* 192* 200* 190*    Recent Results (from the past 240 hour(s))  SARS Coronavirus 2 [facility] order, Performed in [place] hospital lab)     Status: None   Collection Time: 11/30/18  3:14 PM   Specimen: Nasopharyngeal Swab  Result Value Ref Range Status   SARS Coronavirus 2 NEGATIVE NEGATIVE Final    Comment: (NOTE) If result is NEGATIVE SARS-CoV-2 target nucleic acids are NOT DETECTED. The SARS-CoV-2 RNA is generally detectable in  upper and lower  respiratory specimens during the acute phase of infection. The lowest  concentration of SARS-CoV-2 viral copies this assay can detect is 250  copies / mL. A negative result does not preclude SARS-CoV-2 infection  and should not be used as the sole basis for treatment or other  patient management decisions.  A negative result may occur with  improper specimen collection / handling, submission of specimen other  than nasopharyngeal swab, presence of viral mutation(s) within the  areas targeted by this assay, and inadequate number of viral copies  (<250 copies / mL). A negative result must be combined with clinical  observations, patient history, and epidemiological  information. If result is POSITIVE SARS-CoV-2 target nucleic acids are DETECTED. The SARS-CoV-2 RNA is generally detectable in upper and lower  respiratory specimens dur ing the acute phase of infection.  Positive  results are indicative of active infection with SARS-CoV-2.  Clinical  correlation with patient history and other diagnostic information is  necessary to determine patient infection status.  Positive results do  not rule out bacterial infection or co-infection with other viruses. If result is PRESUMPTIVE POSTIVE SARS-CoV-2 nucleic acids MAY BE PRESENT.   A presumptive positive result was obtained on the submitted specimen  and confirmed on repeat testing.  While 2019 novel coronavirus  (SARS-CoV-2) nucleic acids may be present in the submitted sample  additional confirmatory testing may be necessary for epidemiological  and / or clinical management purposes  to differentiate between  SARS-CoV-2 and other Sarbecovirus currently known to infect humans.  If clinically indicated additional testing with an alternate test  methodology [phone]) is advised. The SARS-CoV-2 RNA is generally  detectable in upper and lower respiratory sp ecimens during the acute  phase of infection. The expected result is Negative. Fact Sheet for Patients:  StrictlyIdeas.no Fact Sheet for Healthcare Providers: BankingDealers.co.za This test is not yet approved or cleared by the [place] FDA and has been authorized for detection and/or diagnosis of SARS-CoV-2 by FDA under an Emergency Use Authorization (EUA).  This EUA will remain in effect (meaning this test can be used) for the duration of the COVID-19 declaration under Section 564(b)(1) of the Act, 21 U.S.C. section 360bbb-3(b)(1), unless the authorization is terminated or revoked sooner. Performed at [facility], [address] [place], [place] [address]   Surgical PCR screen      Status: None   Collection Time: 11/30/18  4:35 PM   Specimen: Urine, Catheterized; Nasal Swab  Result Value Ref Range Status   MRSA, PCR NEGATIVE NEGATIVE Final   Staphylococcus aureus NEGATIVE NEGATIVE Final    Comment: (NOTE) The Xpert SA Assay (FDA approved for NASAL specimens in patients 56 years of age and older), is one component of a comprehensive surveillance program. It is not intended to diagnose infection nor to guide or monitor treatment. Performed at [facility], [address] [place], [place] [address]      Studies: Ct Head Wo Contrast  Result Date: 11/30/2018 CLINICAL DATA:  Head trauma, mechanical fall, denies striking head, denies loss of consciousness, slurred speech, chronic, history stroke, hypertension, breast cancer, colon cancer, diabetes mellitus EXAM: CT HEAD WITHOUT CONTRAST CT CERVICAL SPINE WITHOUT CONTRAST TECHNIQUE: Multidetector CT imaging of the head and cervical spine was performed following the standard protocol without intravenous contrast. Multiplanar CT image reconstructions of the cervical spine were also generated. COMPARISON:  09/27/2018 Correlation: PET-CT 04/27/2018 FINDINGS: CT HEAD FINDINGS Brain: Generalized atrophy. Normal ventricular morphology. No midline  shift or mass effect. Small vessel chronic ischemic changes of deep cerebral white matter. No intracranial hemorrhage, mass lesion, evidence of acute infarction, or extra-axial fluid collection. Vascular: No hyperdense vessels. Minimal atherosclerotic calcification of internal carotid arteries at skull base Skull: Intact Sinuses/Orbits: Partial opacification of ethmoid air cells and LEFT mastoid air cells. Remaining visualized paranasal sinuses and RIGHT mastoid air cells clear Other: N/A CT CERVICAL SPINE FINDINGS Alignment: Normal Skull base and vertebrae: Osseous demineralization. Skull base intact. Multilevel facet degenerative changes. Degenerative disc disease changes of the  caudal cervical spine. Significant endplate spur formation and uncovertebral spurring at C5-C6 and C6-C7 narrow the LEFT neural foramina. No fracture, subluxation or bone destruction. Soft tissues and spinal canal: Prevertebral soft tissues normal thickness Disc levels:  No significant abnormalities Upper chest: 9 mm focus of ground-glass opacity at LEFT apex unchanged since 04/27/2018. Remaining apices clear. Other: N/A IMPRESSION: Atrophy with small vessel chronic ischemic changes of deep cerebral white matter. No acute intracranial abnormalities. Degenerative disc and facet disease changes of the cervical spine. No acute cervical spine abnormalities. 9 mm focus of ground-glass opacity at LEFT apex, stable since 04/27/2018, when it demonstrated no worrisome FDG accumulation. Electronically Signed   By: Lavonia Dana M.D.   On: 11/30/2018 13:07   Ct Cervical Spine Wo Contrast  Result Date: 11/30/2018 CLINICAL DATA:  Head trauma, mechanical fall, denies striking head, denies loss of consciousness, slurred speech, chronic, history stroke, hypertension, breast cancer, colon cancer, diabetes mellitus EXAM: CT HEAD WITHOUT CONTRAST CT CERVICAL SPINE WITHOUT CONTRAST TECHNIQUE: Multidetector CT imaging of the head and cervical spine was performed following the standard protocol without intravenous contrast. Multiplanar CT image reconstructions of the cervical spine were also generated. COMPARISON:  09/27/2018 Correlation: PET-CT 04/27/2018 FINDINGS: CT HEAD FINDINGS Brain: Generalized atrophy. Normal ventricular morphology. No midline shift or mass effect. Small vessel chronic ischemic changes of deep cerebral white matter. No intracranial hemorrhage, mass lesion, evidence of acute infarction, or extra-axial fluid collection. Vascular: No hyperdense vessels. Minimal atherosclerotic calcification of internal carotid arteries at skull base Skull: Intact Sinuses/Orbits: Partial opacification of ethmoid air cells and LEFT  mastoid air cells. Remaining visualized paranasal sinuses and RIGHT mastoid air cells clear Other: N/A CT CERVICAL SPINE FINDINGS Alignment: Normal Skull base and vertebrae: Osseous demineralization. Skull base intact. Multilevel facet degenerative changes. Degenerative disc disease changes of the caudal cervical spine. Significant endplate spur formation and uncovertebral spurring at C5-C6 and C6-C7 narrow the LEFT neural foramina. No fracture, subluxation or bone destruction. Soft tissues and spinal canal: Prevertebral soft tissues normal thickness Disc levels:  No significant abnormalities Upper chest: 9 mm focus of ground-glass opacity at LEFT apex unchanged since 04/27/2018. Remaining apices clear. Other: N/A IMPRESSION: Atrophy with small vessel chronic ischemic changes of deep cerebral white matter. No acute intracranial abnormalities. Degenerative disc and facet disease changes of the cervical spine. No acute cervical spine abnormalities. 9 mm focus of ground-glass opacity at LEFT apex, stable since 04/27/2018, when it demonstrated no worrisome FDG accumulation. Electronically Signed   By: Lavonia Dana M.D.   On: 11/30/2018 13:07   Dg Shoulder Left  Result Date: 11/30/2018 CLINICAL DATA:  Pain after fall EXAM: LEFT SHOULDER - 2+ VIEW COMPARISON:  September 27, 2018 FINDINGS: Again noted is an impacted fracture of the proximal left humerus through the neck and greater tuberosity. There is incomplete associated callus formation. No other fractures noted. No dislocation identified. IMPRESSION: Again noted is an impacted fracture through the left humeral neck  and greater tubercle. There is associated callus formation although incomplete. The displacement is mildly increased compared to the April 2020 study but there appears to be bridging callus formation which would argue against an acute on chronic fracture. Recommend clinical correlation. Electronically Signed   By: Dorise Bullion III M.D   On: 11/30/2018  12:37   Dg Hip Unilat W Or W/o Pelvis 2-3 Views Left  Result Date: 12/01/2018 CLINICAL DATA:  Status post left total hip replacement. EXAM: DG HIP (WITH OR WITHOUT PELVIS) 2-3V LEFT COMPARISON:  Left hip radiographs 11/30/2018 FINDINGS: Left hip hemiarthroplasty is noted. Femoral component is well seated. The hip is located. No fracture is present. Vascular calcifications are noted. Gas is present within the joint. Skin staples are in place. IMPRESSION: 1. Left hip hemiarthroplasty without radiographic evidence for complication. 2. Atherosclerosis Electronically Signed   By: San Morelle M.D.   On: 12/01/2018 11:38   Dg Hip Unilat With Pelvis 2-3 Views Left  Result Date: 11/30/2018 CLINICAL DATA:  Left hip pain after fall. EXAM: DG HIP (WITH OR WITHOUT PELVIS) 2-3V LEFT COMPARISON:  None. FINDINGS: There is a displaced subcapital fracture through the proximal left femur. The lateral view is limited but there is no convincing evidence of dislocation based on the frontal views. The right hip is intact. The pelvic bones are otherwise unremarkable. IMPRESSION: Displaced subcapital fracture in the proximal left femur/hip as above. Electronically Signed   By: Dorise Bullion III M.D   On: 11/30/2018 12:34    Scheduled Meds: . acidophilus  1 capsule Oral Daily  . calcium-vitamin D  1 tablet Oral Daily  . carvedilol  12.5 mg Oral BID WC  . docusate sodium  100 mg Oral BID  . [START ON 12/03/2018] enoxaparin (LOVENOX) injection  30 mg Subcutaneous Q24H  . feeding supplement (GLUCERNA SHAKE)  237 mL Oral TID BM  . hydroxyurea  500 mg Oral Once per day on Sun Mon Tue Wed Thu Fri  . insulin aspart  0-5 Units Subcutaneous QHS  . insulin aspart  0-9 Units Subcutaneous TID WC  . insulin detemir  8 Units Subcutaneous QHS  . loratadine  10 mg Oral Daily  . multivitamin with minerals  1 tablet Oral Daily  . mupirocin ointment  1 application Nasal BID  . nitroGLYCERIN  0.1 mg Transdermal Daily  .  pantoprazole  40 mg Oral Daily  . pravastatin  20 mg Oral QHS  . QUEtiapine  12.5 mg Oral QHS  . ramipril  5 mg Oral BID  . sertraline  25 mg Oral Daily  . spironolactone  12.5 mg Oral Daily   Continuous Infusions: . sodium chloride 50 mL/hr at 12/02/18 0448    Assessment/Plan:  1. Acute delirium.  In speaking with the POA yesterday sometimes she has issues with anesthesia.  PRN Haldol during the day and Seroquel at night . 2. Closed left hip fracture requiring operative repair.  We will have to see how she does with physical therapy.  Insurance company will need to approve rehab. 3. History of stroke, CAD and ischemic cardiomyopathy.  Plavix restarted.  Continue low-dose Coreg and statin. 4. Accelerated hypertension on presentation secondary to pain.  Blood pressure on the lower side and patient refused to take meds this morning.  We will hold the ramipril.  Lower the dose of Coreg for this evening. 5. Type 2 diabetes mellitus.  Continue Lantus and sliding scale 6. Hyperlipidemia unspecified on Pravachol 7. Polycythemia vera.  Continue Hydrea  8. Depression on Zoloft     Code Status:     Code Status Orders  (From admission, onward)         Start     Ordered   11/30/18 1438  Do not attempt resuscitation (DNR)  Continuous    Question Answer Comment  In the event of cardiac or respiratory ARREST Do not call a "code blue"   In the event of cardiac or respiratory ARREST Do not perform Intubation, CPR, defibrillation or ACLS   In the event of cardiac or respiratory ARREST Use medication by any route, position, wound care, and other measures to relive pain and suffering. May use oxygen, suction and manual treatment of airway obstruction as needed for comfort.   Comments nurse may pronounce      11/30/18 1438        Code Status History    Date Active Date Inactive Code Status Order ID Comments User Context   11/04/2017 1556 11/17/2017 1805 Partial Code [id number]  Elizabeth Sauer Inpatient   11/04/2017 1556 11/04/2017 1556 Full Code [id number]  Cathlyn Parsons, PA-C Inpatient   10/30/2017 0139 11/04/2017 1536 Partial Code [id number]  Aroor, Lanice Schwab, MD Inpatient   10/29/2017 2025 10/30/2017 0139 Full Code [id number]  Aroor, Lanice Schwab, MD Inpatient   10/28/2017 2153 10/29/2017 1906 Full Code [id number]  Saundra Shelling, MD Inpatient   03/28/2015 1930 04/03/2015 1642 Full Code [id number]  Leonie Green, MD Inpatient   12/20/2014 1853 12/23/2014 1752 Full Code [id number]  Vaughan Basta, MD Inpatient   Advance Care Planning Activity    Advance Directive Documentation     Most Recent Value  Type of Advance Directive  Healthcare Power of Attorney  Pre-existing out of facility DNR order (yellow form or pink MOST form)  -  "MOST" Form in Place?  -     Family Communication: Tried to reach POA on the phone Disposition Plan: To be determined.  Consultants:  Orthopedic surgery  Cardiology consultation requested by anesthesia  Time spent: 26 minutes  Luis M. Cintron

## 2018-12-02 NOTE — Progress Notes (Signed)
  Subjective: 1 Day Post-Op Procedure(s) (LRB): ARTHROPLASTY BIPOLAR HIP (HEMIARTHROPLASTY) (Left) Patient reports pain as mild.   Patient is well, and has had no acute complaints or problems PT and care management to assist with discharge planning Negative for chest pain and shortness of breath Fever: no Gastrointestinal:Negative for nausea and vomiting  Objective: Vital signs in last 24 hours: Temp:  [96.5 F (35.8 C)-98.4 F (36.9 C)] 97.2 F (36.2 C) (06/12 0344) Pulse Rate:  [60-79] 79 (06/12 0344) Resp:  [15-18] 18 (06/12 0344) BP: (85-167)/(54-86) 115/67 (06/12 0344) SpO2:  [91 %-98 %] 95 % (06/12 0344)  Intake/Output from previous day:  Intake/Output Summary (Last 24 hours) at 12/02/2018 0756 Last data filed at 12/02/2018 0653 Gross per 24 hour  Intake 1066.36 ml  Output 850 ml  Net 216.36 ml    Intake/Output this shift: No intake/output data recorded.  Labs: Recent Labs    11/30/18 1314 12/01/18 0303 12/02/18 0558  HGB 14.3 13.5 10.4*   Recent Labs    12/01/18 0303 12/02/18 0558  WBC 12.7* 11.2*  RBC 4.40 3.45*  HCT 42.5 33.0*  PLT 402* 379   Recent Labs    12/01/18 0303 12/02/18 0558  NA 137 133*  K 4.2 3.6  CL 102 104  CO2 27 24  BUN 32* 33*  CREATININE 1.06* 1.32*  GLUCOSE 205* 222*  CALCIUM 8.9 7.7*   Recent Labs    11/30/18 1314  INR 1.2     EXAM General - Patient is Alert, Appropriate and Oriented Extremity - ABD soft Neurovascular intact Sensation intact distally Intact pulses distally Dorsiflexion/Plantar flexion intact Incision: dressing C/D/I No cellulitis present Dressing/Incision - clean, dry, no drainage Motor Function - intact, moving foot and toes well on exam.    Past Medical History:  Diagnosis Date  . Adenocarcinoma in situ of cervix   . Arthritis    hands  . Breast cancer ([place])   . Colon cancer ([place])   . Diabetes mellitus without complication ([place])   . Endometrial carcinoma ([facility])    s/p total  abdominal hysterectomy  . H/O compression fracture of spine 2014   thoracic spine  . H/O polycythemia vera   . H/O TIA (transient ischemic attack) and stroke 09/2014, 03/2015   No deficits  . Hypertension   . Hyperuricemia   . Microalbuminuria   . Myocardial infarction ([place])   . Polycythemia vera ([place])   . Recurrent falls   . Skin cancer    face, legs  . Stroke [facility]) 2008   no deficits  . Trochanteric bursitis   . Varicose veins    treated    Assessment/Plan: 1 Day Post-Op Procedure(s) (LRB): ARTHROPLASTY BIPOLAR HIP (HEMIARTHROPLASTY) (Left) Active Problems:   Closed left hip fracture (H[facility])  Estimated body mass index is 17.64 kg/m as calculated from the following:   Height as of this encounter: 5\' 5"  (1.651 m).   Weight as of this encounter: 48.1 kg. Advance diet Up with therapy D/C IV fluids when tolerating po intake.  Labs reviewed this AM, Hg 10.4 Up with therapy today. Begin working on BM at this time.  DVT Prophylaxis - Lovenox, Foot Pumps and TED hose Weight-Bearing as tolerated to left leg  J. Cameron Proud, PA-C [facility] Orthopaedic Surgery 12/02/2018, 7:56 AM

## 2018-12-02 NOTE — TOC Initial Note (Signed)
Transition of Care [facility]) - Initial/Assessment Note    Patient Details  Name: [name] MRN: [id number] Date of Birth: 01-13-1932  Transition of Care [facility]) CM/SW Contact:    Sadie Pickar, Lenice Llamas Phone Number: [phone]  12/02/2018, 2:16 PM  Clinical Narrative:  Clinical Social Worker ([place]) received consult to coordinate D/C plan. CSW attempted to meet with patient however she was confused and was not alert and oriented. CSW contacted patient's niece/ HPOA Collie Siad to discuss D/C plan. Per Collie Siad she is patient's HPOA and stated that patient lives in [place] and has 24/7 caregivers. CSW explained that patient is confused and PT is recommending SNF. Per Collie Siad patient is not confused at baseline. Per Collie Siad she prefers for patient to D/C home and wants [place]. Per [facility] representative she can accept patient. Per Collie Siad she is agreeable to SNF search in [facility] however she still prefers for patient to D/C home. Per Collie Siad patient has a rolling walker and bedside commode already at home. FL2 complete and faxed out. CSW will continue to follow and assist as needed.    Expected Discharge Plan: [place] Barriers to Discharge: Continued Medical Work up   Patient Goals and CMS Choice     Choice offered to / list presented to : [facility] POA / Guardian  Expected Discharge Plan and Services Expected Discharge Plan: [place] In-house Referral: Clinical Social Work Discharge Planning Services: CM Consult Post Acute Care Choice: [place] arrangements for the past 2 months: [place] Expected Discharge Date: 12/02/18                         HH Arranged: PT [place]: [facility] [employer] Date [place]: 12/02/18 Time [place]: 73 Representative spoke with at [place]: [place] Arrangements/Services Living arrangements  for the past 2 months: [place] with:: Other (Comment)(24/7 Caregivers) Patient language and need for interpreter reviewed:: No        Need for Family Participation in Patient Care: Yes (Comment) Care giver support system in place?: Yes (comment)   Criminal Activity/Legal Involvement Pertinent to Current Situation/Hospitalization: No - Comment as needed  Activities of Daily Living Home Assistive Devices/Equipment: Cane (specify quad or straight), Walker (specify type) ADL Screening (condition at time of admission) Patient's cognitive ability adequate to safely complete daily activities?: Yes Is the patient deaf or have difficulty hearing?: No Does the patient have difficulty seeing, even when wearing glasses/contacts?: No Does the patient have difficulty concentrating, remembering, or making decisions?: Yes Patient able to express need for assistance with ADLs?: Yes Does the patient have difficulty dressing or bathing?: Yes Independently performs ADLs?: No Does the patient have difficulty walking or climbing stairs?: Yes Weakness of Legs: Left Weakness of Arms/Hands: None  Permission Sought/Granted                  Emotional Assessment Appearance:: Appears stated age Attitude/Demeanor/Rapport: Other (comment)(Confused) Affect (typically observed): Afraid/Fearful Orientation: : Oriented to Self, Fluctuating Orientation (Suspected and/or reported Sundowners) Alcohol / Substance Use: Not Applicable Psych Involvement: No (comment)  Admission diagnosis:  Closed subcapital fracture of left femur, initial encounter ([place]) [S72.012A] Patient Active Problem List   Diagnosis Date Noted  . Closed left hip fracture ([place]) 11/30/2018  . Liver lesion 04/22/2018  . Goals of care, counseling/discussion  04/22/2018  . Cognitive deficit, post-stroke 12/31/2017  . Ataxia, post-stroke 12/31/2017  . Dysarthria, post-stroke   . Gait disturbance, post-stroke   . H/O cerebral  parenchymal hemorrhage 11/04/2017  . Essential hypertension 11/04/2017  . Hyperlipidemia 11/04/2017  . Diabetes ([place]) 11/04/2017  . CAD (coronary artery disease) 11/04/2017  . 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 Hypokalemia 11/04/2017  . Right-sided nontraumatic intracerebral hemorrhage of cerebellum ([place])   . History of cervical cancer   . History of TIA (transient ischemic attack)   . Acute systolic congestive heart failure ([place])   . Reactive hypertension   . Hypernatremia   . Leukocytosis   . Acute blood loss anemia   . Elevated serum creatinine   . Acute respiratory failure with hypoxia ([place])   . IVH (intraventricular hemorrhage) ([place]) 10/29/2017  . Hypoxia 10/28/2017  . Pancreatic lesion 05/24/2017  . 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 Nodule of upper lobe of left lung 06/04/2015  . 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 Cerebral thrombosis with cerebral infarction 04/03/2015  . Cancer of right colon ([place]) 03/28/2015  . CVA (cerebral infarction) 12/21/2014  . Polycythemia vera ([place]) 06/22/2006   PCP:  Baxter Hire, MD Pharmacy:   [place], [place] - [address] [place] [address] Phone: [phone] Fax: [phone]     Social Determinants of Health (SDOH) Interventions    Readmission Risk Interventions No flowsheet data found.

## 2018-12-02 NOTE — Progress Notes (Signed)
Yelling out that she is thirsty. RN enters room and offers water to pt who replies "Not from you." RN will have another staff member attempt. Pt does have NS running at 50. RN heard crash in room, ran in to find IV pole on floor and pt pulling at it. Pt is still in bed but reaching for tubing. Pt pulled off abductor pillow. When RN puts IV pole up right and inspects it pt attempts to swat at RN with pillow and repeats "you better run." RN will administer Haldol as needed.

## 2018-12-02 NOTE — Progress Notes (Signed)
Pt is yelling out in room. Swats at [employer] when RN comes near for any pt care. Refuses meds and eats very little. Haldol administered for agitation and pulling at IV line. RN attempted to administer medications twice, refused both times.

## 2018-12-02 NOTE — Progress Notes (Signed)
PT Cancellation Note  Patient Details Name: [name] MRN: [id number] DOB: [birth year]   Cancelled Treatment:    Reason Eval/Treat Not Completed: (Treatment session attempted.  Patient sleeping upon arrival to room, but awakens to voice, light touch. Therapist introduced self and oriented to patient to role of therapy.  Patient reaches up for therapist name tag asking "what's your name", then grabs forcefully at therapist's mask trying to pull from face/neck. Once released, continues to try to grab clothing, swat at therapist.  Remains agitated and unable to actively participation with session at this time.  Will continue efforts next date; however, given limited ability to participate, will modify frequency to QD. If confusion and agitation improves and patient able to actively participate/progress, will resume BID when appropriate.)   Takaya Hyslop H. Owens Shark, PT, DPT, NCS 12/02/18, 4:56 PM [phone]

## 2018-12-03 LAB — CBC
HCT: 29.6 % — ABNORMAL LOW (ref 36.0–46.0)
Hemoglobin: 9.4 g/dL — ABNORMAL LOW (ref 12.0–15.0)
MCH: 30.7 pg (ref 26.0–34.0)
MCHC: 31.8 g/dL (ref 30.0–36.0)
MCV: 96.7 fL (ref 80.0–100.0)
Platelets: 402 10*3/uL — ABNORMAL HIGH (ref 150–400)
RBC: 3.06 MIL/uL — ABNORMAL LOW (ref 3.87–5.11)
RDW: 16.2 % — ABNORMAL HIGH (ref 11.5–15.5)
WBC: 9.2 10*3/uL (ref 4.0–10.5)
nRBC: 0 % (ref 0.0–0.2)

## 2018-12-03 LAB — BASIC METABOLIC PANEL
Anion gap: 7 (ref 5–15)
BUN: 31 mg/dL — ABNORMAL HIGH (ref 8–23)
CO2: 25 mmol/L (ref 22–32)
Calcium: 8 mg/dL — ABNORMAL LOW (ref 8.9–10.3)
Chloride: 105 mmol/L (ref 98–111)
Creatinine, Ser: 1.08 mg/dL — ABNORMAL HIGH (ref 0.44–1.00)
GFR calc Af Amer: 55 mL/min — ABNORMAL LOW (ref >60)
GFR calc non Af Amer: 47 mL/min — ABNORMAL LOW (ref >60)
Glucose, Bld: 94 mg/dL (ref 70–99)
Potassium: 3.9 mmol/L (ref 3.5–5.1)
Sodium: 137 mmol/L (ref 135–145)

## 2018-12-03 LAB — GLUCOSE, CAPILLARY
Glucose-Capillary: 84 mg/dL (ref 70–99)
Glucose-Capillary: 170 mg/dL — ABNORMAL HIGH (ref 70–99)
Glucose-Capillary: 198 mg/dL — ABNORMAL HIGH (ref 70–99)
Glucose-Capillary: 148 mg/dL — ABNORMAL HIGH (ref 70–99)

## 2018-12-03 MED ORDER — POLYETHYLENE GLYCOL 3350 17 G PO PACK
17.0000 g | PACK | Freq: Every day | ORAL | Status: DC
  Administered 2018-12-03 – 2018-12-04 (×2): 17 g via ORAL
  Filled 2018-12-03 (×2): qty 1

## 2018-12-03 NOTE — Progress Notes (Signed)
Physical Therapy Treatment Patient Details Name: [name] MRN: [id number] DOB: 1932/03/30 Today's Date: 12/03/2018    History of Present Illness  L hip fx    PT Comments    Pt in recliner, ready for PM session.  Stood with mod a x 1 and was able to stand pivot transfer back to bed with min/mod a x 1 and increased time.  Sitting EOB with min guard.  Pt was able to stand x 2 at bedside with walker and min/mod a.  Stepping in place with dec step height and stance time on L.  Returned to supine with max x 1 and positioned to comfort.  Participated in exercises as described below.  Discussed discharge plan with pt and niece.  Pt has 24 hour a day caregivers (LPN and CNA)  They hope to discharge home with current staffing and it is a reasonable option.  They have concerns over visitor restrictions in SNF and the social/cognitive effects along with Covid risks. Pt is progressing with mobility at each session and staff should be able to care for her.  Pt will benefit from HHPT.  They have most of the equipment needed but will need a wheelchair and cushion. EMS transport home due to stairs at home and they were encouraged to consider building a ramp.   Patient suffers from a hip fracture which impairs his/her ability to perform daily activities like toileting, feeding, dressing, grooming, bathing in the home. A walker  will not resolve the patient's issue with performing activities of daily living. A wheelchair is required/recommended and will allow patient to safely perform daily activities.   Patient can safely propel the wheelchair in the home or has a caregiver who can provide assistance.     Follow Up Recommendations  Home health PT;Supervision for mobility/OOB;Supervision/Assistance - 24 hour     Equipment Recommendations  Wheelchair (measurements PT);Wheelchair cushion (measurements PT);Other (comment)    Recommendations for Other Services       Precautions / Restrictions  Precautions Precautions: None Restrictions Weight Bearing Restrictions: Yes LLE Weight Bearing: Weight bearing as tolerated    Mobility  Bed Mobility Overal bed mobility: Needs Assistance Bed Mobility: Sit to Supine Rolling: Max assist;Mod assist   Supine to sit: Mod assist;Max assist Sit to supine: Max assist   General bed mobility comments: tactile cues for hand placements, assist as she is able but limited.  Transfers Overall transfer level: Needs assistance Equipment used: None;Rolling walker (2 wheeled) Transfers: Sit to/from [facility] Sit to Stand: Min assist;Mod assist Stand pivot transfers: Min assist;Mod assist       General transfer comment: Pt able to move feet with small steps during transfer.  Overall does well.  Ambulation/Gait             General Gait Details: unable.  is able to march in place this pm with dec step height, cautious   Stairs             Wheelchair Mobility    Modified Rankin (Stroke Patients Only)       Balance Overall balance assessment: Needs assistance Sitting-balance support: Feet supported Sitting balance-Leahy Scale: Fair Sitting balance - Comments: able to hold upright with min guard.  unsafe to be left unattended.   Standing balance support: Bilateral upper extremity supported Standing balance-Leahy Scale: Poor Standing balance comment: requires assist at all times but improving with each session  Cognition Arousal/Alertness: Awake/alert Behavior During Therapy: WFL for tasks assessed/performed Overall Cognitive Status: Within Functional Limits for tasks assessed                                 General Comments: Clear today with no confusion noted or episodes of combativeness.  Pleasant and cooperative.      Exercises Other Exercises Other Exercises: Pt inc urine (purwic not seated right).  Rolling left and right for care.     General Comments        Pertinent Vitals/Pain Pain Assessment: Faces Pain Score: 3  Faces Pain Scale: Hurts little more Pain Location: L hip Pain Descriptors / Indicators: Sore;Operative site guarding Pain Intervention(s): Limited activity within patient's tolerance;Monitored during session    Home Living                      Prior Function            PT Goals (current goals can now be found in the care plan section) Progress towards PT goals: Progressing toward goals    Frequency    BID      PT Plan Frequency needs to be updated;Discharge plan needs to be updated    Co-evaluation              AM-PAC PT "6 Clicks" Mobility   Outcome Measure  Help needed turning from your back to your side while in a flat bed without using bedrails?: A Lot Help needed moving from lying on your back to sitting on the side of a flat bed without using bedrails?: A Lot Help needed moving to and from a bed to a chair (including a wheelchair)?: A Lot Help needed standing up from a chair using your arms (e.g., wheelchair or bedside chair)?: A Lot Help needed to walk in hospital room?: Total Help needed climbing 3-5 steps with a railing? : Total 6 Click Score: 10    End of Session Equipment Utilized During Treatment: Gait belt Activity Tolerance: Patient tolerated treatment well Patient left: in bed;with call bell/phone within reach;with bed alarm set   Pain - Right/Left: Left Pain - part of body: Hip     Time: 1245-1318 PT Time Calculation (min) (ACUTE ONLY): 33 min  Charges:  $Therapeutic Exercise: 8-22 mins $Therapeutic Activity: 8-22 mins                     Chesley Noon, PTA 12/03/18, 1:46 PM

## 2018-12-03 NOTE — Progress Notes (Signed)
[place] [facility] [place] NAME: [name]    MR#:  [id number]  DATE OF BIRTH:  1932-06-11  SUBJECTIVE:  CHIEF COMPLAINT:   Chief Complaint  Patient presents with  . Fall  . Shoulder Pain  . Hip Pain  Seen today Awake and alert and responds to all verbal commands Not confused Tolerating diet okay Hip pain better No complaints of any chest pain No fever  REVIEW OF SYSTEMS:    ROS  CONSTITUTIONAL: No documented fever. No fatigue, weakness. No weight gain, no weight loss.  EYES: No blurry or double vision.  ENT: No tinnitus. No postnasal drip. No redness of the oropharynx.  RESPIRATORY: No cough, no wheeze, no hemoptysis. No dyspnea.  CARDIOVASCULAR: No chest pain. No orthopnea. No palpitations. No syncope.  GASTROINTESTINAL: No nausea, no vomiting or diarrhea. No abdominal pain. No melena or hematochezia.  GENITOURINARY: No dysuria or hematuria.  ENDOCRINE: No polyuria or nocturia. No heat or cold intolerance.  HEMATOLOGY: No anemia. No bruising. No bleeding.  INTEGUMENTARY: No rashes. No lesions.  MUSCULOSKELETAL: No arthritis. No swelling. No gout. Decreased hip pain.  NEUROLOGIC: No numbness, tingling, or ataxia. No seizure-type activity.  PSYCHIATRIC: No anxiety. No insomnia. No ADD.   DRUG ALLERGIES:   Allergies  Allergen Reactions  . Simvastatin     Other reaction(s): Muscle Pain    VITALS:  Blood pressure 120/70, pulse 72, temperature 97.7 F (36.5 C), resp. rate 16, height 5\' 5"  (1.651 m), weight 48.1 kg, SpO2 94 %.  PHYSICAL EXAMINATION:   Physical Exam  GENERAL:  85 y.o.-year-old patient lying in the bed with no acute distress.  EYES: Pupils equal, round, reactive to light and accommodation. No scleral icterus. Extraocular muscles intact.  HEENT: Head atraumatic, normocephalic. Oropharynx and nasopharynx clear.  NECK:  Supple, no jugular venous distention. No thyroid enlargement, no tenderness.  LUNGS: Normal  breath sounds bilaterally, no wheezing, rales, rhonchi. No use of accessory muscles of respiration.  CARDIOVASCULAR: S1, S2 normal. No murmurs, rubs, or gallops.  ABDOMEN: Soft, nontender, nondistended. Bowel sounds present. No organomegaly or mass.  EXTREMITIES: No cyanosis, clubbing or edema b/l.    Left hip bandage noted. NEUROLOGIC: Cranial nerves II through XII are intact. No focal Motor or sensory deficits b/l.   PSYCHIATRIC: The patient is alert and oriented x 3.  SKIN: No obvious rash, lesion, or ulcer.   LABORATORY PANEL:   CBC Recent Labs  Lab 12/03/18 0549  WBC 9.2  HGB 9.4*  HCT 29.6*  PLT 402*   ------------------------------------------------------------------------------------------------------------------ Chemistries  Recent Labs  Lab 12/03/18 0548  NA 137  K 3.9  CL 105  CO2 25  GLUCOSE 94  BUN 31*  CREATININE 1.08*  CALCIUM 8.0*   ------------------------------------------------------------------------------------------------------------------  Cardiac Enzymes No results for input(s): TROPONINI in the last 168 hours. ------------------------------------------------------------------------------------------------------------------  RADIOLOGY:  Ct Head Wo Contrast  Result Date: 12/02/2018 CLINICAL DATA:  Altered mental status EXAM: CT HEAD WITHOUT CONTRAST TECHNIQUE: Contiguous axial images were obtained from the base of the skull through the vertex without intravenous contrast. COMPARISON:  11/30/2018 FINDINGS: Brain: There is atrophy and chronic small vessel disease changes. No acute intracranial abnormality. Specifically, no hemorrhage, hydrocephalus, mass lesion, acute infarction, or significant intracranial injury. Vascular: No hyperdense vessel or unexpected calcification. Skull: No acute calvarial abnormality. Sinuses/Orbits: Visualized paranasal sinuses and mastoids clear. Orbital soft tissues unremarkable. Other: None IMPRESSION: Atrophy, chronic  microvascular disease. No acute intracranial abnormality. Electronically Signed   By:  Rolm Baptise M.D.   On: 12/02/2018 21:55     ASSESSMENT AND PLAN:   85 year old elderly female patient with history of hypertension, type 2 diabetes mellitus, hyperlipidemia, polycythemia vera currently under hospitalist service for hip fracture  -Hip fracture left side Status post ORIF procedure POD 2 Physical therapy evaluation and assessment today  -Acute delirium resolved Probably secondary to anesthesia  -Hypertension Better controlled Continue beta-blocker  -Type 2 diabetes mellitus Diabetic diet with Lantus insulin sliding scale coverage with insulin  -Hyperlipidemia Continue Pravachol  -Polycythemia vera Currently on Hydrea  All the records are reviewed and case discussed with Care Management/Social Worker. Management plans discussed with the patient, family and they are in agreement.  CODE STATUS: DNR  DVT Prophylaxis: SCDs  TOTAL TIME TAKING CARE OF THIS PATIENT: 33 minutes.   POSSIBLE D/C IN 2 to 3 DAYS, DEPENDING ON CLINICAL CONDITION.  Saundra Shelling M.D on 12/03/2018 at 12:00 PM  Between 7am to 6pm - Pager - [phone]  After 6pm go to [email or web address] - password [email or web address] [facility]  Office  [phone]  CC: Primary care physician; Baxter Hire, MD  Note: This dictation was prepared with Dragon dictation along with smaller phrase technology. Any transcriptional errors that result from this process are unintentional.

## 2018-12-03 NOTE — Progress Notes (Signed)
  Subjective: 2 Days Post-Op Procedure(s) (LRB): ARTHROPLASTY BIPOLAR HIP (HEMIARTHROPLASTY) (Left) Patient reports pain as mild.  More alert this morning, at baseline.  CT of the head obtained yesterday, results negative. Patient is well, and has had no acute complaints or problems PT and care management to assist with discharge planning Negative for chest pain and shortness of breath Fever: no Gastrointestinal:Negative for nausea and vomiting  Objective: Vital signs in last 24 hours: Temp:  [97.7 F (36.5 C)-97.9 F (36.6 C)] 97.7 F (36.5 C) (06/13 0749) Pulse Rate:  [72-86] 72 (06/13 0749) Resp:  [14-16] 16 (06/13 0749) BP: (116-131)/(55-70) 120/70 (06/13 0749) SpO2:  [91 %-95 %] 94 % (06/13 0749)  Intake/Output from previous day: No intake or output data in the 24 hours ending 12/03/18 0824  Intake/Output this shift: No intake/output data recorded.  Labs: Recent Labs    11/30/18 1314 12/01/18 0303 12/02/18 0558  HGB 14.3 13.5 10.4*   Recent Labs    12/01/18 0303 12/02/18 0558  WBC 12.7* 11.2*  RBC 4.40 3.45*  HCT 42.5 33.0*  PLT 402* 379   Recent Labs    12/02/18 0558 12/03/18 0548  NA 133* 137  K 3.6 3.9  CL 104 105  CO2 24 25  BUN 33* 31*  CREATININE 1.32* 1.08*  GLUCOSE 222* 94  CALCIUM 7.7* 8.0*   Recent Labs    11/30/18 1314  INR 1.2     EXAM General - Patient is Alert, Appropriate and Oriented Extremity - ABD soft Neurovascular intact Sensation intact distally Intact pulses distally Dorsiflexion/Plantar flexion intact Incision: dressing C/D/I No cellulitis present Dressing/Incision - clean, dry, scant dried drainage Motor Function - intact, moving foot and toes well on exam.    Past Medical History:  Diagnosis Date  . Adenocarcinoma in situ of cervix   . Arthritis    hands  . Breast cancer ([place])   . Colon cancer ([place])   . Diabetes mellitus without complication ([place])   . Endometrial carcinoma ([facility])    s/p total abdominal  hysterectomy  . H/O compression fracture of spine 2014   thoracic spine  . H/O polycythemia vera   . H/O TIA (transient ischemic attack) and stroke 09/2014, 03/2015   No deficits  . Hypertension   . Hyperuricemia   . Microalbuminuria   . Myocardial infarction ([place])   . Polycythemia vera ([place])   . Recurrent falls   . Skin cancer    face, legs  . Stroke [facility]) 2008   no deficits  . Trochanteric bursitis   . Varicose veins    treated    Assessment/Plan: 2 Days Post-Op Procedure(s) (LRB): ARTHROPLASTY BIPOLAR HIP (HEMIARTHROPLASTY) (Left) Active Problems:   Closed left hip fracture ([facility])  Estimated body mass index is 17.64 kg/m as calculated from the following:   Height as of this encounter: 5\' 5"  (1.651 m).   Weight as of this encounter: 48.1 kg. Advance diet Up with therapy, weightbearing as tolerated left lower extremity Acute post op blood loss anemia -hemoglobin 10.4 yesterday, CBC pending this morning. Needs bowel movement   DVT Prophylaxis - Lovenox, Foot Pumps and TED hose Weight-Bearing as tolerated to left leg  Duanne Guess, PA-C [facility] 12/03/2018, 8:24 AM

## 2018-12-03 NOTE — Progress Notes (Addendum)
Physical Therapy Treatment Patient Details Name: [name] MRN: [id number] DOB: 1931-12-23 Today's Date: 12/03/2018       PT Comments    Pt in bed,  Confusion cleared this am.  Pt excited to get up today.  Pt noted to bed inc urine.  Pur-wic not seated right and ineffective.  Care provided.  Max a for rolling left and right with tactile cue for hand placements.  To edge of bed with mod a x 1.  She is able to sit with min guard but is unsafe to be left unattended in sitting.  She was able to stand pivot transfer to recliner with mod/max a x 1.  She is able to move her feet when given extra time to do so.  Remained in recliner for lunch with daughter.  Will increase PT to BID due to improvement in cognitive status and ability to participate in sessions.   Follow Up Recommendations  SNF     Equipment Recommendations  Rolling walker with 5" wheels    Recommendations for Other Services       Precautions / Restrictions Precautions Precautions: None Restrictions LLE Weight Bearing: Weight bearing as tolerated    Mobility  Bed Mobility Overal bed mobility: Needs Assistance Bed Mobility: Supine to Sit;Rolling Rolling: Max assist;Mod assist   Supine to sit: Mod assist;Max assist     General bed mobility comments: tactile cues for hand placements, assist as she is able but limited.  Transfers Overall transfer level: Needs assistance Equipment used: None Transfers: Sit to/from [facility] Sit to Stand: Mod assist Stand pivot transfers: Mod assist       General transfer comment: Pt able to move feet with small steps during transfer.  Overall does well.  Ambulation/Gait             General Gait Details: unable   Stairs             Wheelchair Mobility    Modified Rankin (Stroke Patients Only)       Balance Overall balance assessment: Needs assistance Sitting-balance support: Feet supported Sitting balance-Leahy Scale: Fair Sitting  balance - Comments: able to hold upright with min guard.  unsafe to be left unattended.   Standing balance support: Bilateral upper extremity supported Standing balance-Leahy Scale: Poor Standing balance comment: heavy external assist for standing and trasnfer                            Cognition Arousal/Alertness: Awake/alert   Overall Cognitive Status: Within Functional Limits for tasks assessed                                 General Comments: Clear today with no confusion noted or episodes of combativeness.  Pleasant and cooperative.      Exercises Other Exercises Other Exercises: Pt inc urine (purwic not seated right).  Rolling left and right for care.    General Comments        Pertinent Vitals/Pain Pain Assessment: 0-10 Pain Score: 3  Pain Location: L hip Pain Descriptors / Indicators: Sore;Operative site guarding Pain Intervention(s): Limited activity within patient's tolerance;Premedicated before session    Home Living                      Prior Function            PT Goals (  current goals can now be found in the care plan section) Progress towards PT goals: Progressing toward goals    Frequency    BID      PT Plan Current plan remains appropriate;Frequency needs to be updated    Co-evaluation              AM-PAC PT "6 Clicks" Mobility   Outcome Measure  Help needed turning from your back to your side while in a flat bed without using bedrails?: Total Help needed moving from lying on your back to sitting on the side of a flat bed without using bedrails?: Total Help needed moving to and from a bed to a chair (including a wheelchair)?: Total Help needed standing up from a chair using your arms (e.g., wheelchair or bedside chair)?: Total Help needed to walk in hospital room?: Total Help needed climbing 3-5 steps with a railing? : Total 6 Click Score: 6    End of Session Equipment Utilized During Treatment:  Gait belt Activity Tolerance: Patient tolerated treatment well Patient left: in chair;with call bell/phone within reach;with chair alarm set;with family/visitor present         Time: 6734-1937 PT Time Calculation (min) (ACUTE ONLY): 20 min  Charges:  $Therapeutic Activity: 8-22 mins                    Chesley Noon, PTA 12/03/18, 11:47 AM

## 2018-12-04 LAB — BASIC METABOLIC PANEL
Anion gap: 6 (ref 5–15)
BUN: 31 mg/dL — ABNORMAL HIGH (ref 8–23)
CO2: 27 mmol/L (ref 22–32)
Calcium: 8.2 mg/dL — ABNORMAL LOW (ref 8.9–10.3)
Chloride: 104 mmol/L (ref 98–111)
Creatinine, Ser: 0.98 mg/dL (ref 0.44–1.00)
GFR calc Af Amer: >60 mL/min (ref >60)
GFR calc non Af Amer: 53 mL/min — ABNORMAL LOW (ref >60)
Glucose, Bld: 144 mg/dL — ABNORMAL HIGH (ref 70–99)
Potassium: 3.9 mmol/L (ref 3.5–5.1)
Sodium: 137 mmol/L (ref 135–145)

## 2018-12-04 LAB — GLUCOSE, CAPILLARY
Glucose-Capillary: 115 mg/dL — ABNORMAL HIGH (ref 70–99)
Glucose-Capillary: 151 mg/dL — ABNORMAL HIGH (ref 70–99)

## 2018-12-04 LAB — HEMOGLOBIN AND HEMATOCRIT, BLOOD
HCT: 30.6 % — ABNORMAL LOW (ref 36.0–46.0)
Hemoglobin: 9.6 g/dL — ABNORMAL LOW (ref 12.0–15.0)

## 2018-12-04 MED ORDER — POLYETHYLENE GLYCOL 3350 17 G PO PACK: 17.0000 g | PACK | Freq: Every day | ORAL | 0 refills | Status: DC

## 2018-12-04 MED ORDER — TRAMADOL HCL 50 MG PO TABS: 50.0000 mg | ORAL_TABLET | Freq: Four times a day (QID) | ORAL | 0 refills | Status: DC | PRN

## 2018-12-04 MED ORDER — ENOXAPARIN SODIUM 30 MG/0.3ML ~~LOC~~ SOLN: 30.0000 mg | SUBCUTANEOUS | 0 refills | Status: DC

## 2018-12-04 MED ORDER — ACETAMINOPHEN 325 MG PO TABS: 325.0000 mg | ORAL_TABLET | Freq: Four times a day (QID) | ORAL | Status: AC | PRN

## 2018-12-04 MED ORDER — SPIRONOLACTONE 25 MG PO TABS: 12.5000 mg | ORAL_TABLET | ORAL | 0 refills | Status: AC

## 2018-12-04 MED ORDER — QUETIAPINE FUMARATE 25 MG PO TABS: 12.5000 mg | ORAL_TABLET | Freq: Every day | ORAL | 0 refills | Status: DC

## 2018-12-04 NOTE — Progress Notes (Signed)
EMS here to transport pt, niece, [name] notified.

## 2018-12-04 NOTE — Discharge Summary (Signed)
[facility] [place] NAME: [name]    MR#:  [id number]  DATE OF BIRTH:  1932-01-07  DATE OF ADMISSION:  11/30/2018 ADMITTING PHYSICIAN: Loletha Grayer, MD  DATE OF DISCHARGE: 12/04/2018  PRIMARY CARE PHYSICIAN: Baxter Hire, MD   ADMISSION DIAGNOSIS:  Closed subcapital fracture of left femur, initial encounter ([place]) [S72.012A]  DISCHARGE DIAGNOSIS:  Active Problems:   Closed left hip fracture (HCC) Postoperative delirium Type 2 diabetes mellitus History of colon and breast cancer  SECONDARY DIAGNOSIS:   Past Medical History:  Diagnosis Date  . Adenocarcinoma in situ of cervix   . Arthritis    hands  . Breast cancer ([place])   . Colon cancer ([place])   . Diabetes mellitus without complication ([place])   . Endometrial carcinoma (HCC)    s/p total abdominal hysterectomy  . H/O compression fracture of spine 2014   thoracic spine  . H/O polycythemia vera   . H/O TIA (transient ischemic attack) and stroke 09/2014, 03/2015   No deficits  . Hypertension   . Hyperuricemia   . Microalbuminuria   . Myocardial infarction ([place])   . Polycythemia vera ([place])   . Recurrent falls   . Skin cancer    face, legs  . Stroke [facility]) 2008   no deficits  . Trochanteric bursitis   . Varicose veins    treated     ADMITTING HISTORY [name]  is a 85 y.o. female with a known history of stroke, polycythemia vera presents after a fall.  Patient is not a great historian she states that she was reaching for her walker and it gave way and she fell over.  She lives alone but has 2 caretakers.  Patient having severe pain in the left hip.  Found to have a left hip fracture.  Hospitalist services were contacted for further evaluation.  No chest pain or shortness of breath  HOSPITAL COURSE:  Patient was admitted to medical floor and seen by orthopedic consultant.  Plavix was held for procedure.  Patient continued oral hydroxyurea for polycythemia vera.   Sliding scale insulin was given for diabetes mellitus.  Patient underwent open reduction internal fixation procedure and left hip fracture was fixed.  Patient had postoperative delirium.  Low-dose Seroquel was introduced.  Delirium resolved.  Patient tolerated diet well and received physical therapy.  She did well on physical therapy and disposition was recommended to home with home health services and physical therapy.  Patient's family wants patient to be discharged home.  Patient will be discharged home on DVT prophylaxis with subcu Lovenox daily.  Patient hemodynamically stable.  CONSULTS OBTAINED:  Treatment Team:  Corky Mull, MD Teodoro Spray, MD  DRUG ALLERGIES:   Allergies  Allergen Reactions  . Simvastatin     Other reaction(s): Muscle Pain    DISCHARGE MEDICATIONS:   Allergies as of 12/04/2018      Reactions   Simvastatin    Other reaction(s): Muscle Pain      Medication List    TAKE these medications   acetaminophen 325 MG tablet Commonly known as: TYLENOL Take 1-2 tablets (325-650 mg total) by mouth every 6 (six) hours as needed for mild pain (pain score 1-3 or temp > 100.5).   amLODipine 2.5 MG tablet Commonly known as: NORVASC Take 1 tablet (2.5 mg total) by mouth daily.   blood glucose meter kit and supplies Dispense based on patient and insurance preference. Use up to four  times daily as directed. (FOR ICD-10 E10.9, E11.9).   calcium-vitamin D 500-200 MG-UNIT tablet Commonly known as: OSCAL WITH D Take 1 tablet by mouth daily.   carvedilol 25 MG tablet Commonly known as: COREG Take 1 tablet (25 mg total) by mouth 2 (two) times daily with a meal. What changed:   how much to take  additional instructions   clopidogrel 75 MG tablet Commonly known as: PLAVIX Take 1 tablet (75 mg total) by mouth daily.   docusate sodium 250 MG capsule Commonly known as: COLACE Take 250 mg by mouth daily as needed for constipation.   enoxaparin 30 MG/0.3ML  injection Commonly known as: LOVENOX Inject 0.3 mLs (30 mg total) into the skin daily for 14 days. Start taking on: December 05, 2018   fexofenadine 180 MG tablet Commonly known as: Allegra Allergy Take 1 tablet (180 mg total) by mouth daily.   hydroxyurea 500 MG capsule Commonly known as: HYDREA Take 1 tablet daily except on Saturdays   insulin detemir 100 unit/ml Soln Commonly known as: LEVEMIR Inject 0.08 mLs (8 Units total) into the skin at bedtime. What changed: how much to take   pantoprazole 40 MG tablet Commonly known as: PROTONIX Take 1 tablet (40 mg total) by mouth daily.   polyethylene glycol 17 g packet Commonly known as: MIRALAX / GLYCOLAX Take 17 g by mouth daily. Start taking on: December 05, 2018   pravastatin 20 MG tablet Commonly known as: PRAVACHOL Take 1 tablet (20 mg total) by mouth at bedtime.   QUEtiapine 25 MG tablet Commonly known as: SEROQUEL Take 0.5 tablets (12.5 mg total) by mouth at bedtime for 30 days.   ramipril 5 MG capsule Commonly known as: ALTACE Take 5 mg by mouth 2 (two) times daily.   sertraline 25 MG tablet Commonly known as: ZOLOFT Take 25 mg by mouth daily.   spironolactone 25 MG tablet Commonly known as: ALDACTONE Take 0.5 tablets (12.5 mg total) by mouth every other day for 30 days.   traMADol 50 MG tablet Commonly known as: ULTRAM Take 1 tablet (50 mg total) by mouth every 6 (six) hours as needed for moderate pain.       Today  Patient seen today Tolerated physical therapy well Tolerating diet well Will be discharged home with home physical therapy Hemodynamically stable  VITAL SIGNS:  Blood pressure (!) 160/69, pulse 65, temperature (!) 97.2 F (36.2 C), temperature source Axillary, resp. rate 18, height _0  (1.651 m), weight 48.1 kg, SpO2 93 %.  I/O:    Intake/Output Summary (Last 24 hours) at 12/04/2018 1102 Last data filed at 12/04/2018 0907 Gross per 24 hour  Intake 240 ml  Output -  Net 240 ml     PHYSICAL EXAMINATION:  Physical Exam  GENERAL:  85 y.o.-year-old patient lying in the bed with no acute distress.  LUNGS: Normal breath sounds bilaterally, no wheezing, rales,rhonchi or crepitation. No use of accessory muscles of respiration.  CARDIOVASCULAR: S1, S2 normal. No murmurs, rubs, or gallops.  ABDOMEN: Soft, non-tender, non-distended. Bowel sounds present. No organomegaly or mass.  NEUROLOGIC: Moves all 4 extremities. PSYCHIATRIC: The patient is alert and oriented x 3.  SKIN: No obvious rash, lesion, or ulcer.   DATA REVIEW:   CBC Recent Labs  Lab 12/03/18 0549 12/04/18 0434  WBC 9.2  --   HGB 9.4* 9.6*  HCT 29.6* 30.6*  PLT 402*  --     Chemistries  Recent Labs  Lab 12/04/18 0434  NA  137  K 3.9  CL 104  CO2 27  GLUCOSE 144*  BUN 31*  CREATININE 0.98  CALCIUM 8.2*    Cardiac Enzymes No results for input(s): TROPONINI in the last 168 hours.  Microbiology Results  Results for orders placed or performed during the hospital encounter of 11/30/18  SARS Coronavirus 2 [facility] order, Performed in [place] hospital lab)     Status: None   Collection Time: 11/30/18  3:14 PM   Specimen: Nasopharyngeal Swab  Result Value Ref Range Status   SARS Coronavirus 2 NEGATIVE NEGATIVE Final    Comment: (NOTE) If result is NEGATIVE SARS-CoV-2 target nucleic acids are NOT DETECTED. The SARS-CoV-2 RNA is generally detectable in upper and lower  respiratory specimens during the acute phase of infection. The lowest  concentration of SARS-CoV-2 viral copies this assay can detect is 250  copies / mL. A negative result does not preclude SARS-CoV-2 infection  and should not be used as the sole basis for treatment or other  patient management decisions.  A negative result may occur with  improper specimen collection / handling, submission of specimen other  than nasopharyngeal swab, presence of viral mutation(s) within the  areas targeted by this assay, and  inadequate number of viral copies  (<250 copies / mL). A negative result must be combined with clinical  observations, patient history, and epidemiological information. If result is POSITIVE SARS-CoV-2 target nucleic acids are DETECTED. The SARS-CoV-2 RNA is generally detectable in upper and lower  respiratory specimens dur ing the acute phase of infection.  Positive  results are indicative of active infection with SARS-CoV-2.  Clinical  correlation with patient history and other diagnostic information is  necessary to determine patient infection status.  Positive results do  not rule out bacterial infection or co-infection with other viruses. If result is PRESUMPTIVE POSTIVE SARS-CoV-2 nucleic acids MAY BE PRESENT.   A presumptive positive result was obtained on the submitted specimen  and confirmed on repeat testing.  While 2019 novel coronavirus  (SARS-CoV-2) nucleic acids may be present in the submitted sample  additional confirmatory testing may be necessary for epidemiological  and / or clinical management purposes  to differentiate between  SARS-CoV-2 and other Sarbecovirus currently known to infect humans.  If clinically indicated additional testing with an alternate test  methodology [phone]) is advised. The SARS-CoV-2 RNA is generally  detectable in upper and lower respiratory sp ecimens during the acute  phase of infection. The expected result is Negative. Fact Sheet for Patients:  StrictlyIdeas.no Fact Sheet for Healthcare Providers: BankingDealers.co.za This test is not yet approved or cleared by the [place] FDA and has been authorized for detection and/or diagnosis of SARS-CoV-2 by FDA under an Emergency Use Authorization (EUA).  This EUA will remain in effect (meaning this test can be used) for the duration of the COVID-19 declaration under Section 564(b)(1) of the Act, 21 U.S.C. section 360bbb-3(b)(1), unless the  authorization is terminated or revoked sooner. Performed at [facility], [address], [place], [place] [address]   Surgical PCR screen     Status: None   Collection Time: 11/30/18  4:35 PM   Specimen: Urine, Catheterized; Nasal Swab  Result Value Ref Range Status   MRSA, PCR NEGATIVE NEGATIVE Final   Staphylococcus aureus NEGATIVE NEGATIVE Final    Comment: (NOTE) The Xpert SA Assay (FDA approved for NASAL specimens in patients 72 years of age and older), is one component of a comprehensive surveillance program. It is  not intended to diagnose infection nor to guide or monitor treatment. Performed at [facility], [address] [place], [place] [address]     RADIOLOGY:  Ct Head Wo Contrast  Result Date: 12/02/2018 CLINICAL DATA:  Altered mental status EXAM: CT HEAD WITHOUT CONTRAST TECHNIQUE: Contiguous axial images were obtained from the base of the skull through the vertex without intravenous contrast. COMPARISON:  11/30/2018 FINDINGS: Brain: There is atrophy and chronic small vessel disease changes. No acute intracranial abnormality. Specifically, no hemorrhage, hydrocephalus, mass lesion, acute infarction, or significant intracranial injury. Vascular: No hyperdense vessel or unexpected calcification. Skull: No acute calvarial abnormality. Sinuses/Orbits: Visualized paranasal sinuses and mastoids clear. Orbital soft tissues unremarkable. Other: None IMPRESSION: Atrophy, chronic microvascular disease. No acute intracranial abnormality. Electronically Signed   By: Rolm Baptise M.D.   On: 12/02/2018 21:55    Follow up with PCP in 1 week.  Management plans discussed with the patient, family and they are in agreement.  CODE STATUS: DNR    Code Status Orders  (From admission, onward)         Start     Ordered   11/30/18 1438  Do not attempt resuscitation (DNR)  Continuous    Question Answer Comment  In the event of cardiac or respiratory ARREST Do not  call a "code blue"   In the event of cardiac or respiratory ARREST Do not perform Intubation, CPR, defibrillation or ACLS   In the event of cardiac or respiratory ARREST Use medication by any route, position, wound care, and other measures to relive pain and suffering. May use oxygen, suction and manual treatment of airway obstruction as needed for comfort.   Comments nurse may pronounce      11/30/18 1438        Code Status History    Date Active Date Inactive Code Status Order ID Comments User Context   11/04/2017 1556 11/17/2017 1805 Partial Code [id number]  Elizabeth Sauer Inpatient   11/04/2017 1556 11/04/2017 1556 Full Code [id number]  Cathlyn Parsons, PA-C Inpatient   10/30/2017 0139 11/04/2017 1536 Partial Code [id number]  Aroor, Lanice Schwab, MD Inpatient   10/29/2017 2025 10/30/2017 0139 Full Code [id number]  Aroor, Lanice Schwab, MD Inpatient   10/28/2017 2153 10/29/2017 1906 Full Code [id number]  Saundra Shelling, MD Inpatient   03/28/2015 1930 04/03/2015 1642 Full Code [id number]  Leonie Green, MD Inpatient   12/20/2014 1853 12/23/2014 1752 Full Code [id number]  Vaughan Basta, MD Inpatient   Advance Care Planning Activity    Advance Directive Documentation     Most Recent Value  Type of Advance Directive  Healthcare Power of Attorney  Pre-existing out of facility DNR order (yellow form or pink MOST form)  -  "MOST" Form in Place?  -      TOTAL TIME TAKING CARE OF THIS PATIENT ON DAY OF DISCHARGE: more than 35 minutes.   Saundra Shelling M.D on 12/04/2018 at 11:02 AM  Between 7am to 6pm - Pager - [phone]  After 6pm go to [email or web address] - password [email or web address] [place] [facility]  Office  [phone]  CC: Primary care physician; Baxter Hire, MD  Note: This dictation was prepared with Dragon dictation along with smaller phrase technology. Any transcriptional errors that result from this process are unintentional.

## 2018-12-04 NOTE — Progress Notes (Signed)
Pts niece [name] given discharge instructions over the phone, all questions answered.  She verbalized understanding. Pt will transport by EMS, nurse to call EMS @ 2:30 per nieces request.

## 2018-12-04 NOTE — TOC Transition Note (Addendum)
Transition of Care [facility]) - CM/SW Discharge Note   Patient Details  Name: [name] MRN: [id number] Date of Birth: 1932-01-30  [facility]) CM/SW Contact:  Latanya Maudlin, RN Phone Number: 12/04/2018, 11:40 AM   Clinical Narrative:   Patient to be discharged per MD order. Orders in place for home health services. Previous TOC team member had set up home health via [facility]. Notified Helene Kelp of discharge. Patient has progressed to home health recommendation. DME already in the home.    Update 15;38- per RN niece would like a wheelchair. Unfortunately, there are no DME orders and the patient has been discharged. I will call Niece and provide her with options for private pay or perhaps she can obtain a script from MD.      Final next level of care: Home w Home Health Services Barriers to Discharge: No Barriers Identified   Patient Goals and CMS Choice   CMS Medicare.gov Compare Post Acute Care list provided to:: Patient Represenative (must comment)(POA) Choice offered to / list presented to : [place] / [place]  Discharge Placement                       Discharge Plan and Services In-house Referral: Clinical Social Work Discharge Planning Services: CM Consult Post Acute Care Choice: [place], Home Health                    HH Arranged: PT Study [place]: [facility] Date [place]: 12/04/18 Time [place]: [place] Representative spoke with at [place]: [place] ([place]) Interventions     Readmission Risk Interventions Readmission Risk Prevention Plan 12/04/2018  Transportation Screening Complete  Medication Review Press photographer) Complete  PCP or Specialist appointment within 3-5 days of discharge Complete  HRI or Home Care Consult Complete  SW Recovery Care/Counseling Consult Complete  Palliative Care Screening Complete  Skilled Nursing Facility Complete   Some recent data might be hidden

## 2018-12-04 NOTE — Progress Notes (Signed)
TED hose on both legs, surgical dressing clean, dry, intact, extra honeycomb dressing sent in packet. Pt waiting on EMS

## 2018-12-04 NOTE — Discharge Instructions (Signed)
Follow-up with [facility] orthopedics in 2 weeks for staple removal and Steri-Strip application Lovenox 30 mg subcu daily x14 days for DVT prophylaxis TED hose bilateral lower extremities x6 weeks Keep incision site clean and dry.

## 2018-12-04 NOTE — Progress Notes (Addendum)
Physical Therapy Treatment Patient Details Name: [name] MRN: [id number] DOB: 28-Jun-1931 Today's Date: 12/04/2018    History of Present Illness presented to ER secondary to mechanical fall in home environment, acute onset of L hip pain; admitted with displaced L femoral neck fracture, s/p hemiarthroplasty (12/01/18), WBAT, posterior approach    PT Comments    Pt awake, ready for session.  Stated she was hungry and did not get her breakfast.  Pt was sound asleep this am when earlier therapy session was attempted.  Confirmed with tech that pt did not awake for breakfast.  Dining called and they are to send an early lunch tray for pt.  To edge of bed with mod assist.  Stood with min a and was able to march in place before transfer to recliner at bedside.  She was then able to ambulate 110' with walker and min assist with wheelchair follow for safety.  Significant improvement in overall mobility this session.  Remained in recliner after session.  Pt very motivated to improve mobility.  Stair training not completed this session as discharge was anticipated today.  EMS transport home had been requested by niece due to steps and would still be appropriate at this time.  If niece changes her mind and wants to transport in her car, stair training could be done later today if PT is notified.     Follow Up Recommendations  Home health PT;Supervision for mobility/OOB;Supervision/Assistance - 24 hour     Equipment Recommendations  Wheelchair (measurements PT);Wheelchair cushion (measurements PT);Other (comment)    Recommendations for Other Services       Precautions / Restrictions Precautions Precautions: Fall Restrictions Weight Bearing Restrictions: Yes LLE Weight Bearing: Weight bearing as tolerated    Mobility  Bed Mobility Overal bed mobility: Needs Assistance Bed Mobility: Supine to Sit Rolling: Mod assist            Transfers Overall transfer level: Needs  assistance Equipment used: Rolling walker (2 wheeled) Transfers: Sit to/from [facility] Sit to Stand: Min assist            Ambulation/Gait Ambulation/Gait assistance: Min assist Gait Distance (Feet): 110 Feet Assistive device: Rolling walker (2 wheeled) Gait Pattern/deviations: Step-to pattern;Decreased step length - right;Decreased step length - left;Trunk flexed Gait velocity: decreased       Stairs             Wheelchair Mobility    Modified Rankin (Stroke Patients Only)       Balance Overall balance assessment: Needs assistance Sitting-balance support: Feet supported Sitting balance-Leahy Scale: Fair Sitting balance - Comments: able to hold upright with min guard.  unsafe to be left unattended.   Standing balance support: Bilateral upper extremity supported Standing balance-Leahy Scale: Poor Standing balance comment: requires assist at all times but improving with each session                            Cognition Arousal/Alertness: Awake/alert Behavior During Therapy: WFL for tasks assessed/performed Overall Cognitive Status: Within Functional Limits for tasks assessed                                        Exercises Other Exercises Other Exercises: BLE AAROm x 10 ankle pumps, heel slides, ab/add    General Comments        Pertinent Vitals/Pain Pain  Assessment: Faces Faces Pain Scale: Hurts little more Pain Descriptors / Indicators: Sore;Operative site guarding Pain Intervention(s): Limited activity within patient's tolerance;Monitored during session;Repositioned    Home Living                      Prior Function            PT Goals (current goals can now be found in the care plan section) Progress towards PT goals: Progressing toward goals    Frequency    BID      PT Plan Current plan remains appropriate    Co-evaluation              AM-PAC PT "6 Clicks" Mobility    Outcome Measure    Help needed moving from lying on your back to sitting on the side of a flat bed without using bedrails?: A Lot Help needed moving to and from a bed to a chair (including a wheelchair)?: A Little Help needed standing up from a chair using your arms (e.g., wheelchair or bedside chair)?: A Little Help needed to walk in hospital room?: A Little Help needed climbing 3-5 steps with a railing? : A Lot 6 Click Score: 13    End of Session Equipment Utilized During Treatment: Gait belt Activity Tolerance: Patient tolerated treatment well Patient left: in chair;with call bell/phone within reach;with chair alarm set Nurse Communication: Mobility status Pain - Right/Left: Left Pain - part of body: Hip     Time: 8916-9450 PT Time Calculation (min) (ACUTE ONLY): 28 min  Charges:  $Gait Training: 8-22 mins $Therapeutic Exercise: 8-22 mins                     Chesley Noon, PTA 12/04/18, 11:02 AM

## 2018-12-04 NOTE — Progress Notes (Signed)
EMS called for transportation.  

## 2018-12-04 NOTE — Progress Notes (Addendum)
Subjective: 3 Days Post-Op Procedure(s) (LRB): ARTHROPLASTY BIPOLAR HIP (HEMIARTHROPLASTY) (Left) Patient reports pain as mild.  More alert this morning, at baseline.  Good progress with PT yesterday. Patient is well, and has had no acute complaints or problems PT and care management to assist with discharge planning Negative for chest pain and shortness of breath Fever: no Gastrointestinal:Negative for nausea and vomiting  Objective: Vital signs in last 24 hours: Temp:  [97.2 F (36.2 C)-98 F (36.7 C)] 97.2 F (36.2 C) (06/14 0558) Pulse Rate:  [65-71] 65 (06/14 0558) Resp:  [16-18] 18 (06/14 0558) BP: (123-160)/(69) 160/69 (06/14 0558) SpO2:  [93 %] 93 % (06/13 1539)  Intake/Output from previous day:  Intake/Output Summary (Last 24 hours) at 12/04/2018 0915 Last data filed at 12/04/2018 0907 Gross per 24 hour  Intake 360 ml  Output -  Net 360 ml    Intake/Output this shift: No intake/output data recorded.  Labs: Recent Labs    12/02/18 0558 12/03/18 0549 12/04/18 0434  HGB 10.4* 9.4* 9.6*   Recent Labs    12/02/18 0558 12/03/18 0549 12/04/18 0434  WBC 11.2* 9.2  --   RBC 3.45* 3.06*  --   HCT 33.0* 29.6* 30.6*  PLT 379 402*  --    Recent Labs    12/03/18 0548 12/04/18 0434  NA 137 137  K 3.9 3.9  CL 105 104  CO2 25 27  BUN 31* 31*  CREATININE 1.08* 0.98  GLUCOSE 94 144*  CALCIUM 8.0* 8.2*   No results for input(s): LABPT, INR in the last 72 hours.   EXAM General - Patient is Alert, Appropriate and Oriented Extremity - Neurovascular intact Sensation intact distally Dorsiflexion/Plantar flexion intact Incision: dressing C/D/I and no drainage No cellulitis present Dressing/Incision - clean, dry, no drainage Motor Function - intact, moving foot and toes well on exam.    Past Medical History:  Diagnosis Date  . Adenocarcinoma in situ of cervix   . Arthritis    hands  . Breast cancer ([place])   . Colon cancer ([place])   . Diabetes mellitus  without complication ([place])   . Endometrial carcinoma ([facility])    s/p total abdominal hysterectomy  . H/O compression fracture of spine 2014   thoracic spine  . H/O polycythemia vera   . H/O TIA (transient ischemic attack) and stroke 09/2014, 03/2015   No deficits  . Hypertension   . Hyperuricemia   . Microalbuminuria   . Myocardial infarction ([place])   . Polycythemia vera ([place])   . Recurrent falls   . Skin cancer    face, legs  . Stroke [facility]) 2008   no deficits  . Trochanteric bursitis   . Varicose veins    treated    Assessment/Plan: 3 Days Post-Op Procedure(s) (LRB): ARTHROPLASTY BIPOLAR HIP (HEMIARTHROPLASTY) (Left) Active Problems:   Closed left hip fracture (H[facility])  Estimated body mass index is 17.64 kg/m as calculated from the following:   Height as of this encounter: 5\' 5"  (1.651 m).   Weight as of this encounter: 48.1 kg. Advance diet Up with therapy, weightbearing as tolerated left lower extremity Acute post op blood loss anemia -hemoglobin 9.6.  Stable, trending up Care management to assist with discharge to home with home health PT  Follow-up with [facility] orthopedics in 2 weeks for staple removal and Steri-Strip application Lovenox 30 mg subcu daily x14 days TED hose bilateral lower extremities x6 weeks   DVT Prophylaxis - Lovenox, Foot Pumps and TED hose Weight-Bearing as  tolerated to left leg  Duanne Guess, PA-C [facility] 12/04/2018, 9:15 AM

## 2018-12-05 NOTE — Clinical Social Work Note (Cosign Needed)
Patient suffers from closed subcapital fracture of the left femur. Patient is unable to use a walking device such as a walker to ambulate in the home. Use of a lightweight wheelchair will allow patient to perform adl's such as feeding and bathing in the home. Patient is unable to propel themselves in a standard wheelchair. Patient can safely propel themselves in a lightweight wheelchair.

## 2018-12-07 ENCOUNTER — Other Ambulatory Visit: Payer: Self-pay

## 2018-12-07 MED ORDER — CLOPIDOGREL BISULFATE 75 MG PO TABS: 75.0000 mg | ORAL_TABLET | Freq: Every day | ORAL | 0 refills | Status: DC

## 2018-12-07 NOTE — Telephone Encounter (Signed)
Refill done for pt for 3 months.

## 2019-01-02 ENCOUNTER — Ambulatory Visit: Payer: Self-pay | Admitting: Adult Health

## 2019-04-04 ENCOUNTER — Encounter

## 2019-04-04 ENCOUNTER — Ambulatory Visit: Payer: Self-pay | Admitting: Adult Health

## 2019-04-04 ENCOUNTER — Telehealth: Payer: Self-pay

## 2019-04-04 NOTE — Telephone Encounter (Signed)
Unable to get in contact with the patient's daughter Collie Siad. LVM letting her know that her mother's appt has been cancelled due to Janett Billow being out of the office. She will need to call back to r/s. Office number was provided. Mychart message has been sent as well.    If patient or her daughter calls back please reschedule her appt with Janett Billow.

## 2019-05-12 ENCOUNTER — Encounter: Payer: Self-pay | Admitting: Hematology and Oncology

## 2019-05-30 ENCOUNTER — Ambulatory Visit: Payer: Medicare Other | Admitting: Student in an Organized Health Care Education/Training Program

## 2019-06-01 ENCOUNTER — Other Ambulatory Visit: Payer: Self-pay | Admitting: *Deleted

## 2019-06-01 MED ORDER — CLOPIDOGREL BISULFATE 75 MG PO TABS: 75.0000 mg | ORAL_TABLET | Freq: Every day | ORAL | 0 refills | Status: DC

## 2019-07-04 ENCOUNTER — Telehealth: Payer: Self-pay

## 2019-07-04 NOTE — Telephone Encounter (Signed)
Unable to get in contact with the patient. LVM letting her know that her appt will need to be converted into mychart video visit due to Janett Billow being out of the office. Office number was provided.    If patient calls back please convert her office visit into mychart video visit with Janett Billow.

## 2019-07-05 ENCOUNTER — Telehealth (INDEPENDENT_AMBULATORY_CARE_PROVIDER_SITE_OTHER): Payer: Medicare PPO | Admitting: Adult Health

## 2019-07-05 ENCOUNTER — Encounter: Payer: Self-pay | Admitting: Adult Health

## 2019-07-05 DIAGNOSIS — I10 Essential (primary) hypertension: Secondary | ICD-10-CM | POA: Diagnosis not present

## 2019-07-05 DIAGNOSIS — I615 Nontraumatic intracerebral hemorrhage, intraventricular: Secondary | ICD-10-CM | POA: Diagnosis not present

## 2019-07-05 DIAGNOSIS — Z794 Long term (current) use of insulin: Secondary | ICD-10-CM

## 2019-07-05 DIAGNOSIS — E785 Hyperlipidemia, unspecified: Secondary | ICD-10-CM | POA: Diagnosis not present

## 2019-07-05 DIAGNOSIS — I69319 Unspecified symptoms and signs involving cognitive functions following cerebral infarction: Secondary | ICD-10-CM

## 2019-07-05 DIAGNOSIS — E118 Type 2 diabetes mellitus with unspecified complications: Secondary | ICD-10-CM | POA: Diagnosis not present

## 2019-07-05 DIAGNOSIS — R471 Dysarthria and anarthria: Secondary | ICD-10-CM

## 2019-07-05 DIAGNOSIS — F329 Major depressive disorder, single episode, unspecified: Secondary | ICD-10-CM

## 2019-07-05 MED ORDER — SERTRALINE HCL 50 MG PO TABS: 50.0000 mg | ORAL_TABLET | Freq: Every day | ORAL | 4 refills | Status: AC

## 2019-07-05 NOTE — Progress Notes (Signed)
[facility] [address] [place]. [place] [address] [phone]       STROKE FOLLOW UP NOTE  [name] Date of Birth:  1931-12-22 Medical Record Number:  [id number]   Reason for visit: stroke follow up  Virtual Visit via Video Note  I connected with [name] on 07/05/19 at  3:45 PM EST by a video enabled telemedicine application located remotely in my own home and verified that I am speaking with the correct person using two identifiers who was located at their own home accompanied by her niece.   Office staff schedule visit who discussed the limitations of evaluation and management by telemedicine and the availability of in person appointments. The patient expressed understanding and agreed to proceed.    HPI: Stroke admission 10/29/2017: [name] an 86 y.o.femalewith a PMH of Stroke, Recurrent Falls, Polycythemia Vera,Microalbuminuria, HTN, TIA, Endometrial Carcinoma, Diabetes Mellitus, Colon Cancer, Breast Cancer, Arthritis, and Adenocarcinoma in Situ of [facility] and unresponsive 2/2 hypoxia to [facility] on 10/28/17. CTA Chest revealed pericardial effusion, small pleural effusions, and pulmonary edema. She was treated for sepsis secondary to pneumonia. She is on ASA and Plavix. She was noted to have NSTEMI and underwent cardiac cath which showed 3 vessel disease and severe LV dysfunction. Following cath procedure patient had a head CT which showedright cerebellar hemorrhagewith IVH.She was supposed to receive heparin drip, but was held. Patient transferred to [facility] hospital for further management.Hemorrhage was felt to be secondary to hypertensive etiology on dual antiplatelet therapy.  Neuro wise, she was at risk for cerebral edema and treated with 3%.  There was no neurosurgery intervention that would benefit her.  She continued to progress though still had issues with pulmonary edema and CHF.  Developed  leukocytosis, etiology unknown, but resolved during admission.  Once stable, was transferred to the inpatient rehab for ongoing therapy.  Plans are for PCI with stent in the near future. Recommended cardiology to follow-up in 2 weeks. Recommended to resume aspirin at time of discharge from inpatient (7 days) and resume Plavix in 3 weeks.  12/17/17 visit: Patient is being seen today for hospital follow-up and is accompanied by her niece.  She continues to improve from a neurological standpoint but does have mild right-sided weakness along with dysarthria.  She continues to participate in home PT/OT/ST.  She does live independently but does have 24/7 care.  Continues to take aspirin 81 mg at this time without bleeding or bruising.  They had not start Plavix as they state they need clearance from [place] first.  Continues to take pravastatin without myalgias.  Per niece, cardiologist states she needs to be on Plavix for 2 weeks prior to being considered for stent placement.  Blood pressure today satisfactory 124/69.  Patient denies new or worsening stroke/TIA symptoms.  Interval history 03/23/18: Patient is being seen today for scheduled follow-up visit and is accompanied by her niece.  She continues to have some stuttering concerns and right hand weakness but has been improving.  She participates in PT/OT/ST at [facility] and continues to live independently with 24/7 care.  Aids to assist with ADLs and IADLs.  Patient is questioning whether therapy continued at this time.  She continues to take Plavix only without side effects of bleeding or bruising.  She was planning on undergoing stent procedure by cardiologist at [facility] but this ended up being canceled and scheduled appointment with cardiologist through [facility]  system who believes there is no benefit for stent placement and recommended continuation of medication management and monitoring.  Continues to take pravastatin without side effects  myalgias.  Blood pressure today satisfactory 117/67.  Niece does state that current cardiologist has been managing antihypertensives and continues to fluctuate with SBP ranging from 1 10-1 60.  Denies new or worsening stroke/TIA symptoms.  Virtual visit 07/05/2019: [name] is a 86 year old female who is being seen today via virtual visit for stroke follow-up accompanied by her niece.  She has not been seen in over 1 year due to COVID-19 safety precautions.  Residual deficits of dysarthria with hesitancy with wax/wane of symptoms as well as cognitive impairment.  Per daughter, she feels as though her speech has been gradually worsening as far as speaking slower or stuttering as well as worsening memory loss.  She does endorse increased stress and possible depression/anxiety with concerns of COVID-19 pandemic along with constantly being in pain with ongoing chronic knee pain.  She continues to receive 24-hour care for ongoing assistance.  She has continued on Plavix and pravastatin for secondary stroke prevention without side effects.  Prior A1c 7.0 on 11/30/2018.  Blood pressure 128-130/60-70. She unfortunately suffered a close subcapital fracture left femur after a fall on 11/30/2018 requiring open reduction internal fixation procedure. Healed well from hip stand point. Pain in her left knee possibly from arthritis per patient. Needs walker for ambulation. Has been folowing up with ortho with receiving injections without benefit.  Recently referred to pain management clinic for ongoing management.  Denies new or worsening stroke/TIA symptoms.    ROS:   14 system review of systems performed and negative with exception of memory loss, depression, anxiety and impaired speech  PMH:  Past Medical History:  Diagnosis Date  . Adenocarcinoma in situ of cervix   . Arthritis    hands  . Breast cancer ([place])   . Colon cancer ([place])   . Diabetes mellitus without complication ([place])   . Endometrial carcinoma ([facility])     s/p total abdominal hysterectomy  . H/O compression fracture of spine 2014   thoracic spine  . H/O polycythemia vera   . H/O TIA (transient ischemic attack) and stroke 09/2014, 03/2015   No deficits  . Hypertension   . Hyperuricemia   . Microalbuminuria   . Myocardial infarction ([place])   . Polycythemia vera ([place])   . Recurrent falls   . Skin cancer    face, legs  . Stroke [facility]) 2008   no deficits  . Trochanteric bursitis   . Varicose veins    treated    PSH:  Past Surgical History:  Procedure Laterality Date  . ABDOMINAL HYSTERECTOMY    . BOWEL RESECTION N/A 03/28/2015   Procedure: SMALL BOWEL RESECTION;  Surgeon: Leonie Green, MD;  Location: [facility] ORS;  Service: General;  Laterality: N/A;  . CATARACT EXTRACTION W/ INTRAOCULAR LENS IMPLANT Right   . CATARACT EXTRACTION W/PHACO Left 10/07/2015   Procedure: CATARACT EXTRACTION PHACO AND INTRAOCULAR LENS PLACEMENT (IOC);  Surgeon: Ronnell Freshwater, MD;  Location: [place];  Service: Ophthalmology;  Laterality: Left;  DIABETIC - oral meds VISION BLUE  . CORONARY ANGIOGRAPHY N/A 10/29/2017   Procedure: CORONARY ANGIOGRAPHY;  Surgeon: Dionisio David, MD;  Location: [place] [facility];  Service: Cardiovascular;  Laterality: N/A;  . EXPLORATORY LAPAROTOMY     for fibroids  . HIP ARTHROPLASTY Left 12/01/2018   Procedure: ARTHROPLASTY BIPOLAR HIP (HEMIARTHROPLASTY);  Surgeon: Roland Rack,  Marshall Cork, MD;  Location: [facility] ORS;  Service: Orthopedics;  Laterality: Left;  . LEFT HEART CATH Right 10/29/2017   Procedure: Left Heart Cath;  Surgeon: Dionisio David, MD;  Location: [place] [facility];  Service: Cardiovascular;  Laterality: Right;  . TONSILLECTOMY      Social History:  Social History   Socioeconomic History  . Marital status: Widowed    Spouse name: Not on file  . Number of children: Not on file  . Years of education: Not on file  . Highest education level: Not on file  Occupational History  . Not on  file  Tobacco Use  . Smoking status: Former Research scientist (life sciences)  . Smokeless tobacco: Never Used  . Tobacco comment: quit 30+ yrs ago  Substance and Sexual Activity  . Alcohol use: No  . Drug use: No  . Sexual activity: Not on file  Other Topics Concern  . Not on file  Social History Narrative  . Not on file   Social Determinants of Health   Financial Resource Strain:   . Difficulty of Paying Living Expenses: Not on file  Food Insecurity:   . Worried About Charity fundraiser in the Last Year: Not on file  . Ran Out of Food in the Last Year: Not on file  Transportation Needs:   . Lack of Transportation (Medical): Not on file  . Lack of Transportation (Non-Medical): Not on file  Physical Activity:   . Days of Exercise per Week: Not on file  . Minutes of Exercise per Session: Not on file  Stress:   . Feeling of Stress : Not on file  Social Connections:   . Frequency of Communication with Friends and Family: Not on file  . Frequency of Social Gatherings with Friends and Family: Not on file  . Attends Religious Services: Not on file  . Active Member of Clubs or Organizations: Not on file  . Attends Archivist Meetings: Not on file  . Marital Status: Not on file  Intimate Partner Violence:   . Fear of Current or Ex-Partner: Not on file  . Emotionally Abused: Not on file  . Physically Abused: Not on file  . Sexually Abused: Not on file    Family History:  Family History  Problem Relation Age of Onset  . Cancer Brother        AML  . Heart attack Mother   . Breast cancer Mother   . Heart attack Father   . Diabetes Father   . Heart attack Sister   . Diabetes Sister     Medications:   Current Outpatient Medications on File Prior to Visit  Medication Sig Dispense Refill  . acetaminophen (TYLENOL) 325 MG tablet Take 1-2 tablets (325-650 mg total) by mouth every 6 (six) hours as needed for mild pain (pain score 1-3 or temp > 100.5).    Marland Kitchen amLODipine (NORVASC) 2.5 MG tablet  Take 1 tablet (2.5 mg total) by mouth daily. 30 tablet 0  . blood glucose meter kit and supplies Dispense based on patient and insurance preference. Use up to four times daily as directed. (FOR ICD-10 E10.9, E11.9). 1 each 0  . calcium-vitamin D (OSCAL WITH D) 500-200 MG-UNIT per tablet Take 1 tablet by mouth daily.     . carvedilol (COREG) 25 MG tablet Take 1 tablet (25 mg total) by mouth 2 (two) times daily with a meal. (Patient taking differently: Take 12.5 mg by mouth 2 (two) times daily with  a meal. Confirmed with Dr. Etta Quill nurse, patient is to only be taking 0.5 tab bid) 60 tablet 0  . clopidogrel (PLAVIX) 75 MG tablet Take 1 tablet (75 mg total) by mouth daily. 90 tablet 0  . docusate sodium (COLACE) 250 MG capsule Take 250 mg by mouth daily as needed for constipation.    . enoxaparin (LOVENOX) 30 MG/0.3ML injection Inject 0.3 mLs (30 mg total) into the skin daily for 14 days. 14 Syringe 0  . fexofenadine (ALLEGRA ALLERGY) 180 MG tablet Take 1 tablet (180 mg total) by mouth daily.    . hydroxyurea (HYDREA) 500 MG capsule Take 1 tablet daily except on Saturdays 30 capsule 0  . insulin detemir (LEVEMIR) 100 unit/ml SOLN Inject 0.08 mLs (8 Units total) into the skin at bedtime. (Patient taking differently: Inject 10 Units into the skin at bedtime. ) 8 mL 1  . pantoprazole (PROTONIX) 40 MG tablet Take 1 tablet (40 mg total) by mouth daily. 30 tablet 0  . polyethylene glycol (MIRALAX / GLYCOLAX) 17 g packet Take 17 g by mouth daily. 14 each 0  . pravastatin (PRAVACHOL) 20 MG tablet Take 1 tablet (20 mg total) by mouth at bedtime. 30 tablet 0  . QUEtiapine (SEROQUEL) 25 MG tablet Take 0.5 tablets (12.5 mg total) by mouth at bedtime for 30 days. 15 tablet 0  . ramipril (ALTACE) 5 MG capsule Take 5 mg by mouth 2 (two) times daily.    . sertraline (ZOLOFT) 25 MG tablet Take 25 mg by mouth daily.    Marland Kitchen spironolactone (ALDACTONE) 25 MG tablet Take 0.5 tablets (12.5 mg total) by mouth every other day  for 30 days. 7.5 tablet 0  . traMADol (ULTRAM) 50 MG tablet Take 1 tablet (50 mg total) by mouth every 6 (six) hours as needed for moderate pain. 30 tablet 0   No current facility-administered medications on file prior to visit.    Allergies:   Allergies  Allergen Reactions  . Simvastatin     Other reaction(s): Muscle Pain     Physical Exam  General: Frail elderly pleasant Caucasian female, seated, in no evident distress Head: head normocephalic and atraumatic.     Neurologic Exam Mental Status: Awake and fully alert.  Mild to moderate dysarthria with speech hesitancy and stuttering.  Oriented to place and time. Recent and remote memory intact during visit. Attention span, concentration and fund of knowledge appropriate during visit. Mood and affect appropriate.  Cranial Nerves: Extraocular movements full without nystagmus. Hearing slightly diminished to voice.Face, tongue, palate moves normally and symmetrically.  Shoulder shrug symmetric. Motor: No evidence of weakness per drift assessment Sensory.:  Intact to light touch Coordination: Rapid alternating movements normal in all extremities.  Gait and Station: Deferred due to visit type Reflexes: UTA     Diagnostic Data (Labs, Imaging, Testing)  Ct Head Wo Contrast 10/29/2017 Stable RIGHT cerebellar hemorrhage, with extension into the IV ventricle without hydrocephalus.  Ct Head Wo Contrast 10/29/2017 RIGHT cerebellar parenchymal hemorrhage approximately 31 x 16 x 19 mm. There is extension of hemorrhage into the fourth ventricle, without hydrocephalus. A hypertensive bleed is most likely, but unrecognized trauma, anticoagulation, vascular malformation, hemorrhagic metastatic neoplasm, are all considerations.   Ct Head Wo Contrast 10/31/2017 Stable RIGHT cerebellar parenchymal hemorrhage, IVH with no hydrocephalus   Ct Head Wo Contrast 11/03/2017 1. Stable small hemorrhage within the right cerebellar hemisphere. Stable  mild associated edema and local mass effect. 2. No new acute intracranial abnormality. 3. Stable chronic  microvascular ischemic changes and parenchymal volume loss of the brain.  MRI head 11/01/2017 1. Unchanged right cerebellar hematoma with mild intraventricular extension. No hydrocephalus. 2. No acute infarct separate from the hematoma. 3. Moderate chronic small vessel ischemic disease.  Transthoracic Echocardiogram  10/29/2017 - Left ventricle: The cavity size was severely dilated. Systolicfunction was severely reduced. The estimated ejection fractionwas 25%.Akinesis of the anteroseptal myocardium. Akinesis of theanterior myocardium. - Aortic valve: There was trivial regurgitation. Valve area (VTI):1.68 cm^2. Valve area (Vmax): 1.9 cm^2. Valve area (Vmean): 1.81cm^2. - Mitral valve: There was mild regurgitation. Valve area bycontinuity equation (using LVOT flow): 2.19 cm^2. - Left atrium: The atrium was mildly dilated. - Right ventricle: The cavity size was mildly dilated. - Right atrium: The atrium was mildly dilated. - Pericardium, extracardiac: A trivial pericardial effusion wasidentified posterior to the heart. Features were not consistentwith tamponade physiology. There was a left pleural effusion. - 4 Chamber dilatation with severe LV systolic dysfunction withanteroapical akinesis sugestive ASWMI, and no thrombii present inLV. Fibrocalcified aortic valve without AS.  Dg Chest 1 View 10/28/2017 1. Cardiomegaly with new perihilar opacities, presumably pulmonary edema, suggesting CHF/volume overload. Pneumonia is considered less likely. 2. Aortic atherosclerosis.   Ct Angio Chest Pe W And/or Wo Contrast 10/28/2017 1. Pericardial effusion and cardiomegaly.Coronary artery disease.  2. Parenchymal changes consistent with pulmonary edema and small pleural effusions.  3. Atherosclerotic calcification of the thoracic aorta stable aneurysmal dilatationof the aorticarch.  Recommend annual imaging followup by CTA or MRA.This recommendation follows 2010 ACCF/AHA/AATS/ACR/ASA/SCA/SCAI/SIR/STS/SVM Guidelines for the Diagnosis and Management of Patients with Thoracic Aortic Disease. Circulation.2010; 121: F621-H086  4. Aortic aneurysm NOS (ICD10-I71.9). Aortic Atherosclerosis (ICD10-I70.0).  5. Stable probable scar at the LEFT lung apex. 6. Numerous thoracic compression fractures.   Dg Chest Port 1 View 10/30/2017 Cardiomegaly with findings of CHF and small bilateral pleural effusions. No pneumothorax.   Left Heart Cath- Neoma Laming MD [facility] 10/29/2017  Ost Cx to Prox Cx lesion is 80% stenosed with 90% stenosed side branch in Ost 1st Mrg to 1st Mrg.  Mid LAD lesion is 95% stenosed.  Mid RCA lesion is 75% stenosed.  Ost LAD to Prox LAD lesion is 70% stenosed. Severe 3 vessel disease with severe LV systolic dysfunction and anteroapical aneurysm. Advise CABG at [facility].     ASSESSMENT: STARIA BIRKHEAD is a 86 y.o. year old female here with right cerebellar parenchymal hemorrhage with IVH on 10/31/17 secondary to hypertensive plus DAPT. Vascular risk factors include HTN, DM, CAD, decreased EF and CHF.  Residual stroke deficits of dysarthria with slurred speech and stuttering as well as cognitive impairment.  Patient and niece believe this has been slowly worsening over the past 6 months.    PLAN: -Discussion regarding slowly worsening dysarthria and cognitive impairment which appears to be related to increased stress with underlying anxiety and depression.  She was recently restarted on Seroquel by PCP and continue Seroquel 25 mg nightly as well as Zoloft.  Will be recommended to increase Zoloft dose from 25 mg daily to 50 mg.  Discussion regarding possible adverse effects and patient needs verbalized understanding.  Patient is questioning possible benefit with use of speech therapy.  Referral placed for home health PT and speech therapy.  No indication  at this time for repeat imaging or any other testing.  Advised if any worsening or accompanied by weakness or acute mental status change to call 911 immediately for further evaluation -Continue Plavix 75 mg daily and pravastatin for  secondary stroke prevention -F/u with PCP regarding your HLD, HTN and DM management -f/u with cardiologist as scheduled -Advised to continue to stay active along with doing home exercises and maintain a healthy diet -continue to monitor BP at home -Maintain strict control of hypertension with blood pressure goal below 130/90, diabetes with hemoglobin A1c goal below 6.5% and cholesterol with LDL cholesterol (bad cholesterol) goal below 70 mg/dL. I also advised the patient to eat a healthy diet with plenty of whole grains, cereals, fruits and vegetables, exercise regularly and maintain ideal body weight.  Follow up in 3 months or call earlier if needed   Greater than 50% of time during this 30 minute nonface-to-face visit was spent on counseling, discussion regarding worsening symptoms likely due to increased stress, daily pain and depression/anxiety, explanation of diagnosis of ICH, reviewing risk factor management of HLD, HTN and DM, planning of further management, discussion with patient and family and coordination of care   Frann Rider, [facility]  [facility] [address] [place], [place] [address]  Phone [phone] Fax [phone]

## 2019-07-06 NOTE — Progress Notes (Signed)
I agree with the above plan 

## 2019-07-15 ENCOUNTER — Other Ambulatory Visit: Payer: Self-pay | Admitting: Adult Health

## 2019-07-18 ENCOUNTER — Encounter: Payer: Self-pay | Admitting: Emergency Medicine

## 2019-07-18 ENCOUNTER — Inpatient Hospital Stay
Admission: EM | Admit: 2019-07-18 | Discharge: 2019-07-20 | DRG: 809 | Disposition: A | Discharge status: Home Health | Payer: Medicare PPO | Attending: Internal Medicine | Admitting: Internal Medicine

## 2019-07-18 ENCOUNTER — Other Ambulatory Visit: Payer: Self-pay

## 2019-07-18 ENCOUNTER — Emergency Department: Payer: Medicare PPO

## 2019-07-18 DIAGNOSIS — E538 Deficiency of other specified B group vitamins: Secondary | ICD-10-CM | POA: Diagnosis present

## 2019-07-18 DIAGNOSIS — W1830XA Fall on same level, unspecified, initial encounter: Secondary | ICD-10-CM | POA: Diagnosis present

## 2019-07-18 DIAGNOSIS — I69398 Other sequelae of cerebral infarction: Secondary | ICD-10-CM | POA: Diagnosis not present

## 2019-07-18 DIAGNOSIS — K769 Liver disease, unspecified: Secondary | ICD-10-CM | POA: Diagnosis present

## 2019-07-18 DIAGNOSIS — F05 Delirium due to known physiological condition: Secondary | ICD-10-CM | POA: Diagnosis not present

## 2019-07-18 DIAGNOSIS — Z888 Allergy status to other drugs, medicaments and biological substances status: Secondary | ICD-10-CM

## 2019-07-18 DIAGNOSIS — R451 Restlessness and agitation: Secondary | ICD-10-CM | POA: Diagnosis present

## 2019-07-18 DIAGNOSIS — I69393 Ataxia following cerebral infarction: Secondary | ICD-10-CM

## 2019-07-18 DIAGNOSIS — E1151 Type 2 diabetes mellitus with diabetic peripheral angiopathy without gangrene: Secondary | ICD-10-CM | POA: Diagnosis present

## 2019-07-18 DIAGNOSIS — R2689 Other abnormalities of gait and mobility: Secondary | ICD-10-CM | POA: Diagnosis present

## 2019-07-18 DIAGNOSIS — I252 Old myocardial infarction: Secondary | ICD-10-CM

## 2019-07-18 DIAGNOSIS — Z794 Long term (current) use of insulin: Secondary | ICD-10-CM | POA: Diagnosis not present

## 2019-07-18 DIAGNOSIS — Z885 Allergy status to narcotic agent status: Secondary | ICD-10-CM

## 2019-07-18 DIAGNOSIS — Z20822 Contact with and (suspected) exposure to covid-19: Secondary | ICD-10-CM | POA: Diagnosis present

## 2019-07-18 DIAGNOSIS — D649 Anemia, unspecified: Secondary | ICD-10-CM | POA: Diagnosis not present

## 2019-07-18 DIAGNOSIS — Z515 Encounter for palliative care: Secondary | ICD-10-CM

## 2019-07-18 DIAGNOSIS — F0391 Unspecified dementia with behavioral disturbance: Secondary | ICD-10-CM | POA: Diagnosis present

## 2019-07-18 DIAGNOSIS — I2510 Atherosclerotic heart disease of native coronary artery without angina pectoris: Secondary | ICD-10-CM | POA: Diagnosis present

## 2019-07-18 DIAGNOSIS — Z66 Do not resuscitate: Secondary | ICD-10-CM | POA: Diagnosis present

## 2019-07-18 DIAGNOSIS — F419 Anxiety disorder, unspecified: Secondary | ICD-10-CM | POA: Diagnosis present

## 2019-07-18 DIAGNOSIS — R410 Disorientation, unspecified: Secondary | ICD-10-CM

## 2019-07-18 DIAGNOSIS — K219 Gastro-esophageal reflux disease without esophagitis: Secondary | ICD-10-CM | POA: Diagnosis present

## 2019-07-18 DIAGNOSIS — W19XXXA Unspecified fall, initial encounter: Secondary | ICD-10-CM

## 2019-07-18 DIAGNOSIS — D61818 Other pancytopenia: Secondary | ICD-10-CM | POA: Diagnosis present

## 2019-07-18 DIAGNOSIS — Z8542 Personal history of malignant neoplasm of other parts of uterus: Secondary | ICD-10-CM

## 2019-07-18 DIAGNOSIS — R443 Hallucinations, unspecified: Secondary | ICD-10-CM | POA: Diagnosis not present

## 2019-07-18 DIAGNOSIS — J302 Other seasonal allergic rhinitis: Secondary | ICD-10-CM | POA: Diagnosis present

## 2019-07-18 DIAGNOSIS — D45 Polycythemia vera: Secondary | ICD-10-CM | POA: Diagnosis present

## 2019-07-18 DIAGNOSIS — C182 Malignant neoplasm of ascending colon: Secondary | ICD-10-CM | POA: Diagnosis present

## 2019-07-18 DIAGNOSIS — F329 Major depressive disorder, single episode, unspecified: Secondary | ICD-10-CM | POA: Diagnosis present

## 2019-07-18 DIAGNOSIS — E119 Type 2 diabetes mellitus without complications: Secondary | ICD-10-CM

## 2019-07-18 DIAGNOSIS — Z7902 Long term (current) use of antithrombotics/antiplatelets: Secondary | ICD-10-CM

## 2019-07-18 DIAGNOSIS — I69319 Unspecified symptoms and signs involving cognitive functions following cerebral infarction: Secondary | ICD-10-CM | POA: Diagnosis not present

## 2019-07-18 DIAGNOSIS — Z90710 Acquired absence of both cervix and uterus: Secondary | ICD-10-CM

## 2019-07-18 DIAGNOSIS — Z853 Personal history of malignant neoplasm of breast: Secondary | ICD-10-CM

## 2019-07-18 DIAGNOSIS — I69322 Dysarthria following cerebral infarction: Secondary | ICD-10-CM | POA: Diagnosis not present

## 2019-07-18 DIAGNOSIS — Z833 Family history of diabetes mellitus: Secondary | ICD-10-CM

## 2019-07-18 DIAGNOSIS — Z87891 Personal history of nicotine dependence: Secondary | ICD-10-CM

## 2019-07-18 DIAGNOSIS — I10 Essential (primary) hypertension: Secondary | ICD-10-CM | POA: Diagnosis present

## 2019-07-18 DIAGNOSIS — R296 Repeated falls: Secondary | ICD-10-CM | POA: Diagnosis present

## 2019-07-18 DIAGNOSIS — Z8541 Personal history of malignant neoplasm of cervix uteri: Secondary | ICD-10-CM

## 2019-07-18 DIAGNOSIS — E785 Hyperlipidemia, unspecified: Secondary | ICD-10-CM | POA: Diagnosis present

## 2019-07-18 DIAGNOSIS — D72819 Decreased white blood cell count, unspecified: Secondary | ICD-10-CM | POA: Diagnosis not present

## 2019-07-18 DIAGNOSIS — Z85828 Personal history of other malignant neoplasm of skin: Secondary | ICD-10-CM

## 2019-07-18 DIAGNOSIS — Z79899 Other long term (current) drug therapy: Secondary | ICD-10-CM

## 2019-07-18 LAB — URINALYSIS, COMPLETE (UACMP) WITH MICROSCOPIC
Bacteria, UA: NONE SEEN
Bilirubin Urine: NEGATIVE
Glucose, UA: NEGATIVE mg/dL
Hgb urine dipstick: NEGATIVE
Ketones, ur: NEGATIVE mg/dL
Nitrite: NEGATIVE
Protein, ur: 30 mg/dL — AB
Specific Gravity, Urine: 1.019 (ref 1.005–1.030)
pH: 5 (ref 5.0–8.0)

## 2019-07-18 LAB — BASIC METABOLIC PANEL
Anion gap: 6 (ref 5–15)
BUN: 37 mg/dL — ABNORMAL HIGH (ref 8–23)
CO2: 25 mmol/L (ref 22–32)
Calcium: 8.8 mg/dL — ABNORMAL LOW (ref 8.9–10.3)
Chloride: 104 mmol/L (ref 98–111)
Creatinine, Ser: 1.19 mg/dL — ABNORMAL HIGH (ref 0.44–1.00)
GFR calc Af Amer: 48 mL/min — ABNORMAL LOW (ref >60)
GFR calc non Af Amer: 42 mL/min — ABNORMAL LOW (ref >60)
Glucose, Bld: 160 mg/dL — ABNORMAL HIGH (ref 70–99)
Potassium: 4.2 mmol/L (ref 3.5–5.1)
Sodium: 135 mmol/L (ref 135–145)

## 2019-07-18 LAB — CBC WITH DIFFERENTIAL/PLATELET
Abs Immature Granulocytes: 0 10*3/uL (ref 0.00–0.07)
Basophils Absolute: 0 10*3/uL (ref 0.0–0.1)
Basophils Relative: 0 %
Eosinophils Absolute: 0 10*3/uL (ref 0.0–0.5)
Eosinophils Relative: 1 %
HCT: 20.7 % — ABNORMAL LOW (ref 36.0–46.0)
Hemoglobin: 7 g/dL — ABNORMAL LOW (ref 12.0–15.0)
Immature Granulocytes: 0 %
Lymphocytes Relative: 38 %
Lymphs Abs: 0.4 10*3/uL — ABNORMAL LOW (ref 0.7–4.0)
MCH: 43.5 pg — ABNORMAL HIGH (ref 26.0–34.0)
MCHC: 33.8 g/dL (ref 30.0–36.0)
MCV: 128.6 fL — ABNORMAL HIGH (ref 80.0–100.0)
Monocytes Absolute: 0.1 10*3/uL (ref 0.1–1.0)
Monocytes Relative: 10 %
Neutro Abs: 0.6 10*3/uL — ABNORMAL LOW (ref 1.7–7.7)
Neutrophils Relative %: 51 %
Platelets: 162 10*3/uL (ref 150–400)
RBC: 1.61 MIL/uL — ABNORMAL LOW (ref 3.87–5.11)
RDW: 18.2 % — ABNORMAL HIGH (ref 11.5–15.5)
Smear Review: NORMAL
WBC: 1.1 10*3/uL — CL (ref 4.0–10.5)
nRBC: 0 % (ref 0.0–0.2)

## 2019-07-18 LAB — CBC
HCT: 21.5 % — ABNORMAL LOW (ref 36.0–46.0)
Hemoglobin: 7.1 g/dL — ABNORMAL LOW (ref 12.0–15.0)
MCH: 42 pg — ABNORMAL HIGH (ref 26.0–34.0)
MCHC: 33 g/dL (ref 30.0–36.0)
MCV: 127.2 fL — ABNORMAL HIGH (ref 80.0–100.0)
Platelets: 160 10*3/uL (ref 150–400)
RBC: 1.69 MIL/uL — ABNORMAL LOW (ref 3.87–5.11)
RDW: 17.9 % — ABNORMAL HIGH (ref 11.5–15.5)
WBC: 1.1 10*3/uL — CL (ref 4.0–10.5)
nRBC: 0 % (ref 0.0–0.2)

## 2019-07-18 LAB — GLUCOSE, CAPILLARY
Glucose-Capillary: 111 mg/dL — ABNORMAL HIGH (ref 70–99)
Glucose-Capillary: 174 mg/dL — ABNORMAL HIGH (ref 70–99)

## 2019-07-18 LAB — PROCALCITONIN: Procalcitonin: <0.1 ng/mL

## 2019-07-18 LAB — LACTIC ACID, PLASMA: Lactic Acid, Venous: 1.2 mmol/L (ref 0.5–1.9)

## 2019-07-18 LAB — RESPIRATORY PANEL BY RT PCR (FLU A&B, COVID)
Influenza A by PCR: NEGATIVE
Influenza B by PCR: NEGATIVE
SARS Coronavirus 2 by RT PCR: NEGATIVE

## 2019-07-18 MED ORDER — SODIUM CHLORIDE 0.45 % IV SOLN: INTRAVENOUS | Status: DC

## 2019-07-18 MED ORDER — SODIUM CHLORIDE 0.9% IV SOLUTION: Freq: Once | INTRAVENOUS | Status: DC

## 2019-07-18 MED ORDER — INSULIN ASPART 100 UNIT/ML ~~LOC~~ SOLN
0.0000 [IU] | Freq: Three times a day (TID) | SUBCUTANEOUS | Status: DC
  Administered 2019-07-19: 2 [IU] via SUBCUTANEOUS
  Administered 2019-07-19: 1 [IU] via SUBCUTANEOUS
  Filled 2019-07-18 (×2): qty 1

## 2019-07-18 MED ORDER — ONDANSETRON HCL 4 MG PO TABS: 4.0000 mg | ORAL_TABLET | Freq: Four times a day (QID) | ORAL | Status: DC | PRN

## 2019-07-18 MED ORDER — ONDANSETRON HCL 4 MG/2ML IJ SOLN: 4.0000 mg | Freq: Four times a day (QID) | INTRAMUSCULAR | Status: DC | PRN

## 2019-07-18 NOTE — ED Provider Notes (Signed)
[facility] Emergency Department Provider Note   ____________________________________________   First MD Initiated Contact with Patient 07/18/19 1656     (approximate)  I have reviewed the triage vital signs and the nursing notes.   HISTORY  Chief Complaint Fall    HPI [name] is a 86 y.o. female with past medical history of polycythemia vera, diabetes, stroke, CAD, CHF, and carcinoid tumor presents to the ED for fall.  Patient reports that just prior to arrival she was standing and brushing her teeth when she suddenly felt weak and fell to the ground.  She reports hitting her head on the wall, but does not think she lost consciousness.  She is unsure what caused her to fall, denies tripping on anything.  She has not had any chest pain or shortness of breath.  She denies any recent fevers, cough, abdominal pain, vomiting, diarrhea, dysuria, or hematuria.  She states she has not received phlebotomy for her polycythemia since the start of Covid, but she is not sure whether she is still taking the hydroxyurea.  Patient currently denies any pain following her fall.        Past Medical History:  Diagnosis Date  . Adenocarcinoma in situ of cervix   . Arthritis    hands  . Breast cancer ([place])   . Colon cancer ([place])   . Diabetes mellitus without complication ([place])   . Endometrial carcinoma (HCC)    s/p total abdominal hysterectomy  . H/O compression fracture of spine 2014   thoracic spine  . H/O polycythemia vera   . H/O TIA (transient ischemic attack) and stroke 09/2014, 03/2015   No deficits  . Hypertension   . Hyperuricemia   . Microalbuminuria   . Myocardial infarction ([place])   . Polycythemia vera ([place])   . Recurrent falls   . Skin cancer    face, legs  . Stroke [facility]) 2008   no deficits  . Trochanteric bursitis   . Varicose veins    treated    Patient Active Problem List   Diagnosis Date Noted  . Pancytopenia ([place]) 07/18/2019  .  Closed left hip fracture ([place]) 11/30/2018  . Liver lesion 04/22/2018  . Goals of care, counseling/discussion 04/22/2018  . Cognitive deficit, post-stroke 12/31/2017  . Ataxia, post-stroke 12/31/2017  . Dysarthria, post-stroke   . Gait disturbance, post-stroke   . H/O cerebral parenchymal hemorrhage 11/04/2017  . Essential hypertension 11/04/2017  . Hyperlipidemia 11/04/2017  . Diabetes ([place]) 11/04/2017  . CAD (coronary artery disease) 11/04/2017  . 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 Hypokalemia 11/04/2017  . Right-sided nontraumatic intracerebral hemorrhage of cerebellum ([place])   . History of cervical cancer   . History of TIA (transient ischemic attack)   . Acute systolic congestive heart failure ([place])   . Reactive hypertension   . Hypernatremia   . Leukocytosis   . Acute blood loss anemia   . Elevated serum creatinine   . Acute respiratory failure with hypoxia ([place])   . IVH (intraventricular hemorrhage) ([place]) 10/29/2017  . Hypoxia 10/28/2017  . Pancreatic lesion 05/24/2017  . 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 Nodule of upper lobe of left lung 06/04/2015  . 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 Cerebral thrombosis with cerebral infarction 04/03/2015  . Cancer of right colon ([place]) 03/28/2015  . CVA (cerebral infarction) 12/21/2014  . Polycythemia vera ([place]) 06/22/2006    Past Surgical History:  Procedure Laterality Date  .  ABDOMINAL HYSTERECTOMY    . BOWEL RESECTION N/A 03/28/2015   Procedure: SMALL BOWEL RESECTION;  Surgeon: Leonie Green, MD;  Location: [facility] ORS;  Service: General;  Laterality: N/A;  . CATARACT EXTRACTION W/ INTRAOCULAR LENS IMPLANT Right   . CATARACT EXTRACTION W/PHACO Left 10/07/2015   Procedure: CATARACT EXTRACTION PHACO AND INTRAOCULAR LENS PLACEMENT (IOC);  Surgeon: Ronnell Freshwater, MD;  Location: [facility];  Service: Ophthalmology;  Laterality: Left;  DIABETIC - oral meds VISION BLUE    . CORONARY ANGIOGRAPHY N/A 10/29/2017   Procedure: CORONARY ANGIOGRAPHY;  Surgeon: Dionisio David, MD;  Location: [place] [facility];  Service: Cardiovascular;  Laterality: N/A;  . EXPLORATORY LAPAROTOMY     for fibroids  . HIP ARTHROPLASTY Left 12/01/2018   Procedure: ARTHROPLASTY BIPOLAR HIP (HEMIARTHROPLASTY);  Surgeon: Corky Mull, MD;  Location: [facility] OR[facility];  Service: Orthopedics;  Laterality: Left;  . LEFT HEART CATH Right 10/29/2017   Procedure: Left Heart Cath;  Surgeon: Dionisio David, MD;  Location: [place] [facility];  Service: Cardiovascular;  Laterality: Right;  . TONSILLECTOMY      Prior to Admission medications   Medication Sig Start Date End Date Taking? Authorizing Provider  acetaminophen (TYLENOL) 325 MG tablet Take 1-2 tablets (325-650 mg total) by mouth every 6 (six) hours as needed for mild pain (pain score 1-3 or temp > 100.5). Patient taking differently: Take 650 mg by mouth 3 (three) times daily.  12/04/18  Yes Duanne Guess, PA-C  amLODipine (NORVASC) 2.5 MG tablet Take 1 tablet (2.5 mg total) by mouth daily. 11/17/17  Yes Angiulli, Lavon Paganini, PA-C  calcium-vitamin D (OSCAL WITH D) 500-200 MG-UNIT per tablet Take 1 tablet by mouth daily.    Yes [provider]  carvedilol (COREG) 25 MG tablet Take 1 tablet (25 mg total) by mouth 2 (two) times daily with a meal. Patient taking differently: Take 12.5 mg by mouth 2 (two) times daily with a meal.  11/16/17  Yes Angiulli, Lavon Paganini, PA-C  clopidogrel (PLAVIX) 75 MG tablet Take 1 tablet (75 mg total) by mouth daily. 06/01/19  Yes McCue, Janett Billow, NP  fexofenadine (ALLEGRA ALLERGY) 180 MG tablet Take 1 tablet (180 mg total) by mouth daily. 12/17/17  Yes Frann Rider, NP  hydroxyurea (HYDREA) 500 MG capsule Take 1 tablet daily except on Saturdays 11/16/17  Yes Angiulli, Lavon Paganini, PA-C  insulin detemir (LEVEMIR) 100 unit/ml SOLN Inject 0.08 mLs (8 Units total) into the skin at bedtime. Patient taking differently:  Inject 10 Units into the skin at bedtime.  11/17/17  Yes Angiulli, Lavon Paganini, PA-C  pantoprazole (PROTONIX) 40 MG tablet Take 1 tablet (40 mg total) by mouth daily. 11/17/17  Yes Angiulli, Lavon Paganini, PA-C  pravastatin (PRAVACHOL) 20 MG tablet Take 1 tablet (20 mg total) by mouth at bedtime. 11/16/17  Yes Angiulli, Lavon Paganini, PA-C  QUEtiapine (SEROQUEL) 50 MG tablet Take 50 mg by mouth at bedtime.    Yes [provider]  ramipril (ALTACE) 5 MG capsule Take 5 mg by mouth 2 (two) times daily.   Yes [provider]  sertraline (ZOLOFT) 50 MG tablet Take 1 tablet (50 mg total) by mouth daily. 07/05/19  Yes McCue, Janett Billow, NP  spironolactone (ALDACTONE) 25 MG tablet Take 0.5 tablets (12.5 mg total) by mouth every other day for 30 days. Patient taking differently: Take 12.5 mg by mouth See admin instructions. Take 12.5 mg by mouth twice daily every other day 12/04/18 07/18/19 Yes Pyreddy,  Reatha Harps, MD  nitroGLYCERIN (NITRODUR - DOSED IN MG/24 HR) 0.1 mg/hr patch Place 0.1 mg onto the skin daily.  06/26/19   [provider]    Allergies Other and Simvastatin  Family History  Problem Relation Age of Onset  . Cancer Brother        AML  . Heart attack Mother   . Breast cancer Mother   . Heart attack Father   . Diabetes Father   . Heart attack Sister   . Diabetes Sister     Social History Social History   Tobacco Use  . Smoking status: Former Research scientist (life sciences)  . Smokeless tobacco: Never Used  Substance Use Topics  . Alcohol use: No  . Drug use: No    Review of Systems  Constitutional: No fever/chills.  Positive for generalized weakness and fall. Eyes: No visual changes. ENT: No sore throat. Cardiovascular: Denies chest pain. Respiratory: Denies shortness of breath. Gastrointestinal: No abdominal pain.  No nausea, no vomiting.  No diarrhea.  No constipation. Genitourinary: Negative for dysuria. Musculoskeletal: Negative for back pain. Skin: Negative for rash. Neurological:  Negative for headaches, focal weakness or numbness.  ____________________________________________   PHYSICAL EXAM:  VITAL SIGNS: ED Triage Vitals  Enc Vitals Group     BP 07/18/19 1233 (!) 107/56     Pulse Rate 07/18/19 1233 63     Resp 07/18/19 1233 12     Temp 07/18/19 1233 (!) 95.1 F (35.1 C)     Temp Source 07/18/19 1233 Oral     SpO2 07/18/19 1233 99 %     Weight 07/18/19 1234 120 lb (54.4 kg)     Height 07/18/19 1234 5\' 3"  (1.6 m)     Head Circumference --      Peak Flow --      Pain Score 07/18/19 1233 0     Pain Loc --      Pain Edu? --      Excl. in [place]? --     Constitutional: Alert and oriented. Eyes: Conjunctivae are normal. Head: Atraumatic. Nose: No congestion/rhinnorhea. Mouth/Throat: Mucous membranes are moist. Neck: Normal ROM, no midline cervical spine tenderness. Cardiovascular: Normal rate, regular rhythm. Grossly normal heart sounds. Respiratory: Normal respiratory effort.  No retractions. Lungs CTAB. Gastrointestinal: Soft and nontender. No distention. Genitourinary: deferred Musculoskeletal: No lower extremity tenderness nor edema. Neurologic:  Normal speech and language. No gross focal neurologic deficits are appreciated. Skin:  Skin is warm, dry and intact. No rash noted. Psychiatric: Mood and affect are normal. Speech and behavior are normal.  ____________________________________________   LABS (all labs ordered are listed, but only abnormal results are displayed)  Labs Reviewed  BASIC METABOLIC PANEL - Abnormal; Notable for the following components:      Result Value   Glucose, Bld 160 (*)    BUN 37 (*)    Creatinine, Ser 1.19 (*)    Calcium 8.8 (*)    GFR calc non Af Amer 42 (*)    GFR calc Af Amer 48 (*)    All other components within normal limits  CBC - Abnormal; Notable for the following components:   WBC 1.1 (*)    RBC 1.69 (*)    Hemoglobin 7.1 (*)    HCT 21.5 (*)    MCV 127.2 (*)    MCH 42.0 (*)    RDW 17.9 (*)    All  other components within normal limits  URINALYSIS, COMPLETE (UACMP) WITH MICROSCOPIC - Abnormal; Notable for the following  components:   Color, Urine YELLOW (*)    APPearance CLOUDY (*)    Protein, ur 30 (*)    Leukocytes,Ua TRACE (*)    All other components within normal limits  GLUCOSE, CAPILLARY - Abnormal; Notable for the following components:   Glucose-Capillary 111 (*)    All other components within normal limits  CBC WITH DIFFERENTIAL/PLATELET - Abnormal; Notable for the following components:   WBC 1.1 (*)    RBC 1.61 (*)    Hemoglobin 7.0 (*)    HCT 20.7 (*)    MCV 128.6 (*)    MCH 43.5 (*)    RDW 18.2 (*)    Neutro Abs 0.6 (*)    Lymphs Abs 0.4 (*)    All other components within normal limits  CULTURE, BLOOD (ROUTINE X 2)  CULTURE, BLOOD (ROUTINE X 2)  RESPIRATORY PANEL BY RT PCR (FLU A&B, COVID)  LACTIC ACID, PLASMA  PROCALCITONIN  CBC WITH DIFFERENTIAL/PLATELET  PROCALCITONIN  PATHOLOGIST SMEAR REVIEW  CBG MONITORING, ED   ____________________________________________  EKG  ED ECG REPORT I, Blake Divine, the attending physician, personally viewed and interpreted this ECG.   Date: 07/18/2019  EKG Time: 12:33  Rate: 62  Rhythm: normal sinus rhythm  Axis: LAD  Intervals:left anterior fascicular block  ST&T Change: None   PROCEDURES  Procedure(s) performed (including Critical Care):  Procedures   ____________________________________________   INITIAL IMPRESSION / ASSESSMENT AND PLAN / ED COURSE       86 year old female with history of polycythemia vera, previously receiving therapeutic phlebotomy and taking hydroxyurea, presents to the ED following a fall where she states she suddenly felt very weak and struck her head.  She has no complaints currently and has a nonfocal neurologic exam, however given she takes Plavix and hit her head, will image head and C-spine.  Lab work obtained from triage shows new pancytopenia and patient noted to be  hypothermic.  Temperature improved once patient placed in a room and remainder of vitals are reassuring, however leukopenia and hypothermia raises concern for sepsis.  Will obtain blood cultures and lactate, but hold off on empiric antibiotics for now given no clear infectious process.  Will obtain chest x-ray and UA, discuss with heme-onc.  Patient now appearing more confused and upset, demanding to leave.  I spoke with her niece over the phone, who is her healthcare power of attorney and states patient has occasional bouts of confusion and has 24-hour care at home.  He states that patient has been continuing to take her hydroxyurea.  Niece was able to visit with patient here in the ED and patient subsequently agreeable to CT scans.  CT head and C-spine were negative for acute process.  Chest x-ray and UA both do not show any evidence of infection and pro-Cal is negative, will hold off on antibiotics for now.  Case was discussed with Dr. Grayland Ormond of heme-onc, who agrees with plan for admission, patient to be evaluated by heme-onc in the morning.  Case discussed with hospitalist, who accepts patient for admission.      ____________________________________________   FINAL CLINICAL IMPRESSION(S) / ED DIAGNOSES  Final diagnoses:  Fall, initial encounter  Pancytopenia ([place])  Confusion     ED Discharge Orders    None       Note:  This document was prepared using Dragon voice recognition software and may include unintentional dictation errors.   Blake Divine, MD 07/18/19 2047

## 2019-07-18 NOTE — ED Triage Notes (Signed)
Pt in via [facility] from home; reports fall while standing in bathroom washing her hands.  Pt unable to recall LOC status at time of fall, does report hitting head on wall.  States she is on blood thinners; unable to recall which medication.    Pt with speech deficits at baseline due to previous CVA.  Hypothermic upon arrival, 95.1 orally.

## 2019-07-18 NOTE — ED Notes (Signed)
Triage inserted via this RN, not EDT, Melanie.

## 2019-07-18 NOTE — ED Triage Notes (Signed)
Pt in via EMS from home with c/o mechanical fall and pain to righth leg. Pt also reports hx of pain to right leg as well

## 2019-07-18 NOTE — H&P (Signed)
History and Physical   [name] [id number] DOB: 12-28-1931 DOA: 07/18/2019  Referring MD/NP/PA: Dr. Charna Archer  PCP: Baxter Hire, MD   Outpatient Specialists: Dr. Mike Gip, oncology  Patient coming from: Home  Chief Complaint: Fall with hip pain  HPI: [name] is a 86 y.o. female with medical history significant of polycythemia vera, diabetes, endometrial carcinoma, degenerative disc disease, TIAs, previous CVAs who was brought in from home with complaint of fall.  Patient has had generalized weakness.  The fall was thought to be mechanical in nature.  She has been on hydroxyurea for her PCV.  Patient was noted to have white count of only 1 with hemoglobin of 7.  Platelets fairly preserved at 166.  Pancytopenia with suspected as a result of hydroxyurea intoxication.  She has not had any close follow-up recently.  Patient has noted some hallucinations lately.  Her niece is the primary history giver who is around.  She reported the decline with fatigue and weakness over the last few weeks.  Suspected to be due to symptomatic anemia.  Denied any melena or bright red blood per rectum.  Denied any nausea vomiting or diarrhea.  Patient is otherwise usually stable with her ADLs.  She is being admitted with suspicion of pancytopenia.  Oncology to see patient in the morning..  ED Course: Temperature is 95.1 blood pressure 177/66 pulse 74, respiratory of 18 and oxygen sat 97% on room air.  Her white count is 1.1 hemoglobin 7.1 platelets 162.  Chemistry appears to be reasonable except for BUN of 37, creatinine 1.19 calcium 8.8.  Glucose is 160 and lactic acid 1.2.  COVID-19 screen is negative.  CT cervical spine and CT head without contrast both negative and chest x-ray also showed no acute findings.  Patient is being admitted with concern for pancytopenia.  Review of Systems: As per HPI otherwise 10 point review of systems negative.    Past Medical History:  Diagnosis Date  .  Adenocarcinoma in situ of cervix   . Arthritis    hands  . Breast cancer ([place])   . Colon cancer ([place])   . Diabetes mellitus without complication ([place])   . Endometrial carcinoma ([facility])    s/p total abdominal hysterectomy  . H/O compression fracture of spine 2014   thoracic spine  . H/O polycythemia vera   . H/O TIA (transient ischemic attack) and stroke 09/2014, 03/2015   No deficits  . Hypertension   . Hyperuricemia   . Microalbuminuria   . Myocardial infarction ([place])   . Polycythemia vera ([place])   . Recurrent falls   . Skin cancer    face, legs  . Stroke [facility]) 2008   no deficits  . Trochanteric bursitis   . Varicose veins    treated    Past Surgical History:  Procedure Laterality Date  . ABDOMINAL HYSTERECTOMY    . BOWEL RESECTION N/A 03/28/2015   Procedure: SMALL BOWEL RESECTION;  Surgeon: Leonie Green, MD;  Location: [facility] ORS;  Service: General;  Laterality: N/A;  . CATARACT EXTRACTION W/ INTRAOCULAR LENS IMPLANT Right   . CATARACT EXTRACTION W/PHACO Left 10/07/2015   Procedure: CATARACT EXTRACTION PHACO AND INTRAOCULAR LENS PLACEMENT (IOC);  Surgeon: Ronnell Freshwater, MD;  Location: [facility];  Service: Ophthalmology;  Laterality: Left;  DIABETIC - oral meds VISION BLUE  . CORONARY ANGIOGRAPHY N/A 10/29/2017   Procedure: CORONARY ANGIOGRAPHY;  Surgeon: Dionisio David, MD;  Location: [place] [facility];  Service: Cardiovascular;  Laterality: N/A;  . EXPLORATORY LAPAROTOMY     for fibroids  . HIP ARTHROPLASTY Left 12/01/2018   Procedure: ARTHROPLASTY BIPOLAR HIP (HEMIARTHROPLASTY);  Surgeon: Corky Mull, MD;  Location: [facility] ORS;  Service: Orthopedics;  Laterality: Left;  . LEFT HEART CATH Right 10/29/2017   Procedure: Left Heart Cath;  Surgeon: Dionisio David, MD;  Location: [place] [facility];  Service: Cardiovascular;  Laterality: Right;  . TONSILLECTOMY       reports that she has quit smoking. She has never used smokeless tobacco. She  reports that she does not drink alcohol or use drugs.  Allergies  Allergen Reactions  . Other Other (See Comments)    ALL OPIOIDS   . Simvastatin Other (See Comments)    Myalgia    Family History  Problem Relation Age of Onset  . Cancer Brother        AML  . Heart attack Mother   . Breast cancer Mother   . Heart attack Father   . Diabetes Father   . Heart attack Sister   . Diabetes Sister      Prior to Admission medications   Medication Sig Start Date End Date Taking? Authorizing Provider  acetaminophen (TYLENOL) 325 MG tablet Take 1-2 tablets (325-650 mg total) by mouth every 6 (six) hours as needed for mild pain (pain score 1-3 or temp > 100.5). Patient taking differently: Take 650 mg by mouth 3 (three) times daily.  12/04/18  Yes Duanne Guess, PA-C  amLODipine (NORVASC) 2.5 MG tablet Take 1 tablet (2.5 mg total) by mouth daily. 11/17/17  Yes Angiulli, Lavon Paganini, PA-C  calcium-vitamin D (OSCAL WITH D) 500-200 MG-UNIT per tablet Take 1 tablet by mouth daily.    Yes [provider]  carvedilol (COREG) 25 MG tablet Take 1 tablet (25 mg total) by mouth 2 (two) times daily with a meal. Patient taking differently: Take 12.5 mg by mouth 2 (two) times daily with a meal.  11/16/17  Yes Angiulli, Lavon Paganini, PA-C  clopidogrel (PLAVIX) 75 MG tablet Take 1 tablet (75 mg total) by mouth daily. 06/01/19  Yes McCue, Janett Billow, NP  fexofenadine (ALLEGRA ALLERGY) 180 MG tablet Take 1 tablet (180 mg total) by mouth daily. 12/17/17  Yes Frann Rider, NP  hydroxyurea (HYDREA) 500 MG capsule Take 1 tablet daily except on Saturdays 11/16/17  Yes Angiulli, Lavon Paganini, PA-C  insulin detemir (LEVEMIR) 100 unit/ml SOLN Inject 0.08 mLs (8 Units total) into the skin at bedtime. Patient taking differently: Inject 10 Units into the skin at bedtime.  11/17/17  Yes Angiulli, Lavon Paganini, PA-C  pantoprazole (PROTONIX) 40 MG tablet Take 1 tablet (40 mg total) by mouth daily. 11/17/17  Yes Angiulli, Lavon Paganini, PA-C    pravastatin (PRAVACHOL) 20 MG tablet Take 1 tablet (20 mg total) by mouth at bedtime. 11/16/17  Yes Angiulli, Lavon Paganini, PA-C  QUEtiapine (SEROQUEL) 50 MG tablet Take 50 mg by mouth at bedtime.    Yes [provider]  ramipril (ALTACE) 5 MG capsule Take 5 mg by mouth 2 (two) times daily.   Yes [provider]  sertraline (ZOLOFT) 50 MG tablet Take 1 tablet (50 mg total) by mouth daily. 07/05/19  Yes McCue, Janett Billow, NP  spironolactone (ALDACTONE) 25 MG tablet Take 0.5 tablets (12.5 mg total) by mouth every other day for 30 days. Patient taking differently: Take 12.5 mg by mouth See admin instructions. Take 12.5 mg by mouth twice daily every other day 12/04/18 07/18/19 Yes  Saundra Shelling, MD  nitroGLYCERIN (NITRODUR - DOSED IN MG/24 HR) 0.1 mg/hr patch Place 0.1 mg onto the skin daily.  06/26/19   [provider]    Physical Exam: Vitals:   07/18/19 1653 07/18/19 1729 07/18/19 1730 07/18/19 1731  BP: (!) 177/66  (!) 168/90   Pulse: 65 (!) 56 (!) 59 63  Resp: 14  18   Temp: 97.7 F (36.5 C)     TempSrc: Oral     SpO2: 99% 99% 98% 98%  Weight:      Height:          Constitutional: Frail, weak, chronically ill looking Vitals:   07/18/19 1653 07/18/19 1729 07/18/19 1730 07/18/19 1731  BP: (!) 177/66  (!) 168/90   Pulse: 65 (!) 56 (!) 59 63  Resp: 14  18   Temp: 97.7 F (36.5 C)     TempSrc: Oral     SpO2: 99% 99% 98% 98%  Weight:      Height:       Eyes: PERRL, lids and conjunctivae normal ENMT: Mucous membranes are dry. Posterior pharynx clear of any exudate or lesions.Normal dentition.  Neck: normal, supple, no masses, no thyromegaly Respiratory: clear to auscultation bilaterally, no wheezing, no crackles. Normal respiratory effort. No accessory muscle use.  Cardiovascular: Regular rate and rhythm, no murmurs / rubs / gallops. No extremity edema. 2+ pedal pulses. No carotid bruits.  Abdomen: no tenderness, no masses palpated. No hepatosplenomegaly. Bowel  sounds positive.  Musculoskeletal: no clubbing / cyanosis. No joint deformity upper and lower extremities. Good ROM, no contractures. Normal muscle tone.  Skin: no rashes, lesions, ulcers. No induration Neurologic: CN 2-12 grossly intact. Sensation intact, DTR normal. Strength 5/5 in all 4.  Psychiatric: . Alert and oriented x 3. Normal mood.     Labs on Admission: I have personally reviewed following labs and imaging studies  CBC: Recent Labs  Lab 07/18/19 1239  WBC 1.1*  1.1*  NEUTROABS 0.6*  HGB 7.0*  7.1*  HCT 20.7*  21.5*  MCV 128.6*  127.2*  PLT 162  0000000   Basic Metabolic Panel: Recent Labs  Lab 07/18/19 1239  NA 135  K 4.2  CL 104  CO2 25  GLUCOSE 160*  BUN 37*  CREATININE 1.19*  CALCIUM 8.8*   GFR: Estimated Creatinine Clearance: 28.6 mL/min (A) (by C-G formula based on SCr of 1.19 mg/dL (H)). Liver Function Tests: No results for input(s): AST, ALT, ALKPHOS, BILITOT, PROT, ALBUMIN in the last 168 hours. No results for input(s): LIPASE, AMYLASE in the last 168 hours. No results for input(s): AMMONIA in the last 168 hours. Coagulation Profile: No results for input(s): INR, PROTIME in the last 168 hours. Cardiac Enzymes: No results for input(s): CKTOTAL, CKMB, CKMBINDEX, TROPONINI in the last 168 hours. BNP (last 3 results) No results for input(s): PROBNP in the last 8760 hours. HbA1C: No results for input(s): HGBA1C in the last 72 hours. CBG: Recent Labs  Lab 07/18/19 1243  GLUCAP 111*   Lipid Profile: No results for input(s): CHOL, HDL, LDLCALC, TRIG, CHOLHDL, LDLDIRECT in the last 72 hours. Thyroid Function Tests: No results for input(s): TSH, T4TOTAL, FREET4, T3FREE, THYROIDAB in the last 72 hours. Anemia Panel: No results for input(s): VITAMINB12, FOLATE, FERRITIN, TIBC, IRON, RETICCTPCT in the last 72 hours. Urine analysis:    Component Value Date/Time   COLORURINE YELLOW (A) 07/18/2019 1239   APPEARANCEUR CLOUDY (A) 07/18/2019 1239    APPEARANCEUR Clear 11/10/2012 1347  LABSPEC 1.019 07/18/2019 1239   LABSPEC 1.012 11/10/2012 1347   PHURINE 5.0 07/18/2019 1239   GLUCOSEU NEGATIVE 07/18/2019 1239   GLUCOSEU Negative 11/10/2012 1347   HGBUR NEGATIVE 07/18/2019 1239   BILIRUBINUR NEGATIVE 07/18/2019 1239   BILIRUBINUR Negative 11/10/2012 [place] 07/18/2019 1239   PROTEINUR 30 (A) 07/18/2019 1239   NITRITE NEGATIVE 07/18/2019 1239   LEUKOCYTESUR TRACE (A) 07/18/2019 1239   LEUKOCYTESUR 1+ 11/10/2012 1347   Sepsis Labs: @LABRCNTIP (procalcitonin:4,lacticidven:4) )No results found for this or any previous visit (from the past 240 hour(s)).   Radiological Exams on Admission: CT Head Wo Contrast  Result Date: 07/18/2019 CLINICAL DATA:  Fall EXAM: CT HEAD WITHOUT CONTRAST TECHNIQUE: Contiguous axial images were obtained from the base of the skull through the vertex without intravenous contrast. COMPARISON:  12/02/2018 FINDINGS: Brain: There is atrophy and chronic small vessel disease changes. No acute intracranial abnormality. Specifically, no hemorrhage, hydrocephalus, mass lesion, acute infarction, or significant intracranial injury. Vascular: No hyperdense vessel or unexpected calcification. Skull: No acute calvarial abnormality. Sinuses/Orbits: Visualized paranasal sinuses and mastoids clear. Orbital soft tissues unremarkable. Other: None IMPRESSION: Atrophy, chronic microvascular disease. No acute intracranial abnormality. Electronically Signed   By: Rolm Baptise M.D.   On: 07/18/2019 19:29   CT Cervical Spine Wo Contrast  Result Date: 07/18/2019 CLINICAL DATA:  Fall EXAM: CT CERVICAL SPINE WITHOUT CONTRAST TECHNIQUE: Multidetector CT imaging of the cervical spine was performed without intravenous contrast. Multiplanar CT image reconstructions were also generated. COMPARISON:  09/27/2018 FINDINGS: Alignment: No subluxation Skull base and vertebrae: No acute fracture. No primary bone lesion or focal  pathologic process. Soft tissues and spinal canal: No prevertebral fluid or swelling. No visible canal hematoma. Disc levels: Diffuse degenerative facet disease bilaterally. Moderate degenerative disc disease most pronounced at C5-6 and C6-7. Upper chest: Ground-glass nodular density in the anterior left upper lobe measures 7 mm and is stable since March of 2019 compatible with benign nodule. No acute findings. Other: None IMPRESSION: Degenerative disc and facet disease.  No acute bony abnormality. Electronically Signed   By: Rolm Baptise M.D.   On: 07/18/2019 19:34   DG Chest Portable 1 View  Result Date: 07/18/2019 CLINICAL DATA:  Hypothermia EXAM: PORTABLE CHEST 1 VIEW COMPARISON:  11/03/2017 FINDINGS: No focal airspace disease or effusion. Mild to moderate cardiomegaly without edema. Tortuous aorta with calcification. No pneumothorax. IMPRESSION: No active disease.  Cardiomegaly. Electronically Signed   By: Donavan Foil M.D.   On: 07/18/2019 18:11      Assessment/Plan Principal Problem:   Pancytopenia ([place]) Active Problems:   Cancer of right colon ([place])   Polycythemia vera ([place])   History of cervical cancer   Essential hypertension   Hyperlipidemia   Diabetes ([place])     #1 bicytopenia: Patient's platelets are reasonably stable.  White count 1.1 with neutropenia and hemoglobin 7.0.  She is symptomatic from the anemia with tiredness weakness and shortness of breath with exertion.  We will transfuse 2 units of packed red blood cells.  Patient will be on neutropenic precaution.  No fever at this point to suggest febrile neutropenia.  Oncology already consulted and will see patient in the morning.  We will continue supportive care overnight.  #2 essential hypertension: Confirm and resume home regimen.  #3 hyperlipidemia: Confirm and resume statin.  #4 diabetes: Initiate sliding scale insulin with home regimen.  #5 PCV: Hold hydroxyurea.  Defer to oncology for further evaluation.   DVT  prophylaxis: SCD Code Status: DNR  Family Communication: Niece who is power of attorney at bedside Disposition Plan: To be determined Consults called: Dr. Grayland Ormond, oncology Admission status: Inpatient  Severity of Illness: The appropriate patient status for this patient is INPATIENT. Inpatient status is judged to be reasonable and necessary in order to provide the required intensity of service to ensure the patient's safety. The patient's presenting symptoms, physical exam findings, and initial radiographic and laboratory data in the context of their chronic comorbidities is felt to place them at high risk for further clinical deterioration. Furthermore, it is not anticipated that the patient will be medically stable for discharge from the hospital within 2 midnights of admission. The following factors support the patient status of inpatient.   " The patient's presenting symptoms include fall. " The worrisome physical exam findings include frail with weakness. " The initial radiographic and laboratory data are worrisome because of bicytopenia. " The chronic co-morbidities include PCV.   * I certify that at the point of admission it is my clinical judgment that the patient will require inpatient hospital care spanning beyond 2 midnights from the point of admission due to high intensity of service, high risk for further deterioration and high frequency of surveillance required.Barbette Merino MD [facility] Pager [phone]  If 7PM-7AM, please contact night-coverage [email or web address] Password [facility]  07/18/2019, 8:31 PM

## 2019-07-18 NOTE — ED Notes (Signed)
Pt refusing covid swab at this time. Pt agitated about wait time and states she wants to go home. Pt adamantly refusing to go to CT. EDP at bedside, unable to convince pt to stay. Pt has some memory loss/mild confusion. Called pt's niece/POA [name] who states she lives in [place] but can come and stay with pt. Charge notified of need for sitter d/t confusion and high fall risk.

## 2019-07-18 NOTE — ED Notes (Signed)
Pt ambulated to bathroom with assistance.  Peri care provided.

## 2019-07-19 DIAGNOSIS — I10 Essential (primary) hypertension: Secondary | ICD-10-CM

## 2019-07-19 DIAGNOSIS — D649 Anemia, unspecified: Secondary | ICD-10-CM

## 2019-07-19 DIAGNOSIS — D72819 Decreased white blood cell count, unspecified: Secondary | ICD-10-CM

## 2019-07-19 DIAGNOSIS — Z794 Long term (current) use of insulin: Secondary | ICD-10-CM

## 2019-07-19 DIAGNOSIS — E119 Type 2 diabetes mellitus without complications: Secondary | ICD-10-CM

## 2019-07-19 DIAGNOSIS — E538 Deficiency of other specified B group vitamins: Secondary | ICD-10-CM

## 2019-07-19 DIAGNOSIS — D45 Polycythemia vera: Secondary | ICD-10-CM

## 2019-07-19 LAB — HEMOGLOBIN A1C
Hgb A1c MFr Bld: 5.3 % (ref 4.8–5.6)
Mean Plasma Glucose: 105.41 mg/dL

## 2019-07-19 LAB — RETICULOCYTES
Immature Retic Fract: 13.7 % (ref 2.3–15.9)
RBC.: 2.63 MIL/uL — ABNORMAL LOW (ref 3.87–5.11)
Retic Count, Absolute: 44.7 10*3/uL (ref 19.0–186.0)
Retic Ct Pct: 1.7 % (ref 0.4–3.1)

## 2019-07-19 LAB — COMPREHENSIVE METABOLIC PANEL
ALT: 20 U/L (ref 0–44)
AST: 15 U/L (ref 15–41)
Albumin: 3.4 g/dL — ABNORMAL LOW (ref 3.5–5.0)
Alkaline Phosphatase: 32 U/L — ABNORMAL LOW (ref 38–126)
Anion gap: 4 — ABNORMAL LOW (ref 5–15)
BUN: 35 mg/dL — ABNORMAL HIGH (ref 8–23)
CO2: 28 mmol/L (ref 22–32)
Calcium: 8.5 mg/dL — ABNORMAL LOW (ref 8.9–10.3)
Chloride: 105 mmol/L (ref 98–111)
Creatinine, Ser: 1.09 mg/dL — ABNORMAL HIGH (ref 0.44–1.00)
GFR calc Af Amer: 54 mL/min — ABNORMAL LOW (ref >60)
GFR calc non Af Amer: 46 mL/min — ABNORMAL LOW (ref >60)
Glucose, Bld: 162 mg/dL — ABNORMAL HIGH (ref 70–99)
Potassium: 4 mmol/L (ref 3.5–5.1)
Sodium: 137 mmol/L (ref 135–145)
Total Bilirubin: 0.7 mg/dL (ref 0.3–1.2)
Total Protein: 5.3 g/dL — ABNORMAL LOW (ref 6.5–8.1)

## 2019-07-19 LAB — CBC
HCT: 19.7 % — ABNORMAL LOW (ref 36.0–46.0)
HCT: 28 % — ABNORMAL LOW (ref 36.0–46.0)
Hemoglobin: 6.8 g/dL — ABNORMAL LOW (ref 12.0–15.0)
Hemoglobin: 9.9 g/dL — ABNORMAL LOW (ref 12.0–15.0)
MCH: 43.6 pg — ABNORMAL HIGH (ref 26.0–34.0)
MCH: 37.9 pg — ABNORMAL HIGH (ref 26.0–34.0)
MCHC: 34.5 g/dL (ref 30.0–36.0)
MCHC: 35.4 g/dL (ref 30.0–36.0)
MCV: 126.3 fL — ABNORMAL HIGH (ref 80.0–100.0)
MCV: 107.3 fL — ABNORMAL HIGH (ref 80.0–100.0)
Platelets: 150 10*3/uL (ref 150–400)
Platelets: 179 10*3/uL (ref 150–400)
RBC: 1.56 MIL/uL — ABNORMAL LOW (ref 3.87–5.11)
RBC: 2.61 MIL/uL — ABNORMAL LOW (ref 3.87–5.11)
RDW: 18 % — ABNORMAL HIGH (ref 11.5–15.5)
WBC: 1.2 10*3/uL — CL (ref 4.0–10.5)
WBC: 1.4 10*3/uL — CL (ref 4.0–10.5)
nRBC: 0 % (ref 0.0–0.2)
nRBC: 0 % (ref 0.0–0.2)

## 2019-07-19 LAB — FOLATE: Folate: 7.9 ng/mL (ref >5.9)

## 2019-07-19 LAB — GLUCOSE, CAPILLARY
Glucose-Capillary: 153 mg/dL — ABNORMAL HIGH (ref 70–99)
Glucose-Capillary: 90 mg/dL (ref 70–99)
Glucose-Capillary: 126 mg/dL — ABNORMAL HIGH (ref 70–99)
Glucose-Capillary: 129 mg/dL — ABNORMAL HIGH (ref 70–99)

## 2019-07-19 LAB — TSH: TSH: 2.314 u[IU]/mL (ref 0.350–4.500)

## 2019-07-19 LAB — PREPARE RBC (CROSSMATCH)

## 2019-07-19 LAB — IRON AND TIBC
Iron: 222 ug/dL — ABNORMAL HIGH (ref 28–170)
Saturation Ratios: 88 % — ABNORMAL HIGH (ref 10.4–31.8)
TIBC: 252 ug/dL (ref 250–450)
UIBC: 30 ug/dL

## 2019-07-19 LAB — VITAMIN B12: Vitamin B-12: 214 pg/mL (ref 180–914)

## 2019-07-19 LAB — PROCALCITONIN: Procalcitonin: <0.1 ng/mL

## 2019-07-19 LAB — PATHOLOGIST SMEAR REVIEW

## 2019-07-19 MED ORDER — CARVEDILOL 12.5 MG PO TABS
12.5000 mg | ORAL_TABLET | Freq: Two times a day (BID) | ORAL | Status: DC
  Administered 2019-07-19 (×2): 12.5 mg via ORAL
  Filled 2019-07-19 (×2): qty 1

## 2019-07-19 MED ORDER — CLOPIDOGREL BISULFATE 75 MG PO TABS
75.0000 mg | ORAL_TABLET | Freq: Every day | ORAL | Status: DC
  Administered 2019-07-19 – 2019-07-20 (×2): 75 mg via ORAL
  Filled 2019-07-19 (×2): qty 1

## 2019-07-19 MED ORDER — RAMIPRIL 5 MG PO CAPS
5.0000 mg | ORAL_CAPSULE | Freq: Two times a day (BID) | ORAL | Status: DC
  Administered 2019-07-19 (×2): 5 mg via ORAL
  Filled 2019-07-19 (×4): qty 1

## 2019-07-19 MED ORDER — AMLODIPINE BESYLATE 5 MG PO TABS
2.5000 mg | ORAL_TABLET | Freq: Every day | ORAL | Status: DC
  Administered 2019-07-19: 2.5 mg via ORAL
  Filled 2019-07-19: qty 1

## 2019-07-19 MED ORDER — CALCIUM CARBONATE-VITAMIN D 500-200 MG-UNIT PO TABS
1.0000 | ORAL_TABLET | Freq: Every day | ORAL | Status: DC
  Administered 2019-07-19 – 2019-07-20 (×2): 1 via ORAL
  Filled 2019-07-19 (×2): qty 1

## 2019-07-19 MED ORDER — PRAVASTATIN SODIUM 20 MG PO TABS
20.0000 mg | ORAL_TABLET | Freq: Every day | ORAL | Status: DC
  Administered 2019-07-19: 20 mg via ORAL
  Filled 2019-07-19: qty 1

## 2019-07-19 MED ORDER — LORATADINE 10 MG PO TABS
10.0000 mg | ORAL_TABLET | Freq: Every day | ORAL | Status: DC
  Administered 2019-07-19 – 2019-07-20 (×2): 10 mg via ORAL
  Filled 2019-07-19 (×2): qty 1

## 2019-07-19 MED ORDER — PANTOPRAZOLE SODIUM 40 MG PO TBEC
40.0000 mg | DELAYED_RELEASE_TABLET | Freq: Every day | ORAL | Status: DC
  Administered 2019-07-19 – 2019-07-20 (×2): 40 mg via ORAL
  Filled 2019-07-19 (×2): qty 1

## 2019-07-19 MED ORDER — VITAMIN B-12 1000 MCG PO TABS
1000.0000 ug | ORAL_TABLET | Freq: Every day | ORAL | Status: DC
  Administered 2019-07-20: 1000 ug via ORAL
  Filled 2019-07-19: qty 1

## 2019-07-19 MED ORDER — ACETAMINOPHEN 325 MG PO TABS
650.0000 mg | ORAL_TABLET | Freq: Four times a day (QID) | ORAL | Status: DC | PRN
  Administered 2019-07-19: 650 mg via ORAL
  Filled 2019-07-19: qty 2

## 2019-07-19 MED ORDER — INSULIN DETEMIR 100 UNIT/ML ~~LOC~~ SOLN
5.0000 [IU] | Freq: Every day | SUBCUTANEOUS | Status: DC
  Administered 2019-07-19: 5 [IU] via SUBCUTANEOUS
  Filled 2019-07-19 (×2): qty 0.05

## 2019-07-19 MED ORDER — SODIUM CHLORIDE 0.9% IV SOLUTION: Freq: Once | INTRAVENOUS | Status: AC

## 2019-07-19 MED ORDER — QUETIAPINE FUMARATE 25 MG PO TABS
50.0000 mg | ORAL_TABLET | Freq: Every day | ORAL | Status: DC
  Administered 2019-07-19: 50 mg via ORAL
  Filled 2019-07-19: qty 2

## 2019-07-19 MED ORDER — NITROGLYCERIN 0.1 MG/HR TD PT24
0.1000 mg | MEDICATED_PATCH | Freq: Every day | TRANSDERMAL | Status: DC
  Administered 2019-07-19 – 2019-07-20 (×2): 0.1 mg via TRANSDERMAL
  Filled 2019-07-19 (×2): qty 1

## 2019-07-19 NOTE — Evaluation (Signed)
Occupational Therapy Evaluation Patient Details Name: [name] MRN: [id number] DOB: 12-08-1931 Today's Date: 07/19/2019    History of Present Illness per MD note: 86 y.o. female with medical history significant of polycythemia vera on hydroxyurea, diabetes, endometrial carcinoma, degenerative disc disease, TIAs, previous CVAs who was brought in from home after a mechanical fall, due to persistent generalized weakness recently.  Labs showed leukopenia (WBC 1k) and anemia (Hbg 7.0) with relatively preserved platelet count of 166k.  Suspect these abnormalities could be secondary to hydroxyurea toxicity.   Clinical Impression   Pt was seen for OT evaluation this date. Prior to hospital admission, pt was living at home with in-home caregiver 24/7. Pt lives in [facility] with 2 STE. Currently pt demonstrates impairments as described below (See OT problem list) which functionally limit her ability to perform ADL/self-care tasks. Pt currently requires MOD A for seated LB ADLs, MIN A for ADL transfers, and MIN A for some aspects of UB self care in sitting.  Pt would benefit from skilled OT to address noted impairments and functional limitations (see below for any additional details) in order to maximize safety and independence while minimizing falls risk and caregiver burden. Upon hospital discharge, recommend HHOT and 24/7 supv from her in-home caregiver to improve pt fxl activity tolerance and for fall prevention.    Follow Up Recommendations  Home health OT;Supervision/Assistance - 24 hour    Equipment Recommendations  3 in 1 bedside commode    Recommendations for Other Services       Precautions / Restrictions Precautions Precautions: Fall Restrictions Weight Bearing Restrictions: No      Mobility Bed Mobility Overal bed mobility: Needs Assistance Bed Mobility: Supine to Sit;Sit to Supine     Supine to sit: Min guard Sit to supine: Min guard      Transfers Overall transfer level:  Needs assistance   Transfers: Sit to/from Stand Sit to Stand: Min assist         General transfer comment: MIN verbal cues for safe hand placement.    Balance Overall balance assessment: Needs assistance Sitting-balance support: Bilateral upper extremity supported Sitting balance-Leahy Scale: Good     Standing balance support: Bilateral upper extremity supported Standing balance-Leahy Scale: Fair                             ADL either performed or assessed with clinical judgement   ADL                                         General ADL Comments: Requires MOD A for LB ADLs sitting, MIN A for ADL transfers, MIN/MOD for most UB ADLs.     Vision Patient Visual Report: No change from baseline       Perception     Praxis      Pertinent Vitals/Pain Pain Assessment: No/denies pain     Hand Dominance Right   Extremity/Trunk Assessment Upper Extremity Assessment Upper Extremity Assessment: Generalized weakness(moves through full arc of motion, grossly 3+/5 shoulder, elbow, grip)   Lower Extremity Assessment Lower Extremity Assessment: Defer to PT evaluation;Generalized weakness       Communication Communication Communication: Expressive difficulties(moderate dysarthria.)   Cognition Arousal/Alertness: Awake/alert Behavior During Therapy: Restless Overall Cognitive Status: History of cognitive impairments - at baseline  General Comments: some increased processing time, difficulty word finding, follows one step commands appropriately, oriented to self and some aspects of situation. Perseverates on wanting to go home.   General Comments       Exercises Other Exercises Other Exercises: OT facilitates education re: role of OT in acute setting and potentially at home. Pt's neice present and verbalized understanding and is somewhat familiar with OT at baseline as pt has had at least once  before. Other Exercises: OT facilitates education re: safety considerations and fall prevention, use of call light, notifying pt and neice of bed alarm.   Shoulder Instructions      Home Living Family/patient expects to be discharged to:: Private residence Living Arrangements: Alone Available Help at Discharge: Personal care attendant;Available 24 hours/day Type of Home: House Home Access: Stairs to enter [employer] of Steps: 2 Entrance Stairs-Rails: Right Home Layout: One level     Bathroom Shower/Tub: Occupational psychologist: Handicapped height     Home Equipment: Environmental consultant - 2 wheels          Prior Functioning/Environment Level of Independence: Needs assistance  Gait / Transfers Assistance Needed: pt performs fxl mobility household distances with RW with caregiver nearby, can apparently typically perform transfers and fxl mobility Independently ADL's / Homemaking Assistance Needed: Caregiver assists with all aspects of self care including ADLs like bathing and dressing as well as IADLs such as cooking/cleaning. Pt's neice checks on her 1-2x/wk and often gets groceries.            OT Problem List: Decreased strength;Decreased activity tolerance;Impaired balance (sitting and/or standing);Decreased cognition;Decreased knowledge of use of DME or AE      OT Treatment/Interventions: Self-care/ADL training;Therapeutic exercise;Energy conservation;DME and/or AE instruction;Therapeutic activities;Patient/family education;Balance training    OT Goals(Current goals can be found in the care plan section) Acute Rehab OT Goals Patient Stated Goal: to go home OT Goal Formulation: With patient Time For Goal Achievement: 08/02/19 Potential to Achieve Goals: Good  OT Frequency: Min 1X/week   Barriers to D/C:            Co-evaluation              AM-PAC OT "6 Clicks" Daily Activity     Outcome Measure Help from another person eating meals?: None Help  from another person taking care of personal grooming?: A Little Help from another person toileting, which includes using toliet, bedpan, or urinal?: A Lot Help from another person bathing (including washing, rinsing, drying)?: A Lot Help from another person to put on and taking off regular upper body clothing?: A Little Help from another person to put on and taking off regular lower body clothing?: A Lot 6 Click Score: 16   End of Session Equipment Utilized During Treatment: Gait belt;Rolling walker  Activity Tolerance: Patient tolerated treatment well Patient left: in bed;with call bell/phone within reach;with bed alarm set;with family/visitor present  OT Visit Diagnosis: Unsteadiness on feet (R26.81);Muscle weakness (generalized) (M62.81)                Time: 1500-1540 OT Time Calculation (min): 40 min Charges:  OT General Charges $OT Visit: 1 Visit OT Evaluation $OT Eval Moderate Complexity: 1 Mod OT Treatments $Self Care/Home Management : 8-22 mins $Therapeutic Activity: 8-22 mins  Gerrianne Scale, MS, OTR/L ascom [phone] 07/19/19, 4:52 PM

## 2019-07-19 NOTE — Consult Note (Signed)
[facility]  Date of admission:  07/18/2019  Inpatient day:  07/19/2019  Consulting physician: Dr Nicole Kindred.   Reason for Consultation:  Leukopenia and anemia.  Chief Complaint: [name] is a 86 y.o. female with polycythemia rubra vera who was admitted through the emergency room with anemia and leukopenia.  HPI:  The patient was last seen in the medical oncology clinic on 10/26/2018.  At that time, she was recovering from a left humerus fracture.  She had multiple hepatic lesions on MRI (biopsy previously discussed).  CBC revealed a hematocrit of 42.3, hemoglobin 13.5, MCV 98.6, platelets 392,000, WBC  7800 (ANC 6100).  She was on hydroxyurea 500 mg 5 days/week. Plan was for 68-Ga DOTATATE PET scan to assess for carcinoid.  She was lost to follow-up.  Her niece notes that she never had her 68-Ga DOTATATE PET scan.  She has had alternating diarrhea and constipation.  She was planning to transition to one of the [facility] oncologist as I had moved to the [facility].  She has had her hydroxyurea filled by Dr Wynetta Emery.  [facility]2018 with a closed left hip fracture s/p a fall.  She underwent left hip unipolar hemiarthroplasty on 12/01/2018  by Dr Roland Rack.  Hydroxyurea continued.  CBCs followed: 12/03/2018:  Hematocrit 29.6, hemoglobin 9.4, MCV 96.7, platelets 402,000, WBC 9200. 02/03/2019:  Hematocrit 40.3, hemoglobin 12.9, MCV 102.3, platelets 316,000, WBC 4900 with an ANC of 3510.  The patient lives independently with the assistance of a caregiver.  For the past several weeks she has had increasing fatigue.  She denies any melena, hematochezia, hematuria or vaginal bleeding.  She has lost a non-specific amount of weight per her niece (weight actually up from 106 pounds in 11/2018).  Diet is poor.  She has had no fever.  She presented to the ER via EMS following a fall in the bathroom while washing her hands.  She is unable  to recollect any specifics.  She has chronic memory issues.  She has had some hallucinations in the recent past.  Head CT without contrast revealed atrophy and chronic microvascular disease.  There was no acute intracranial abnormality.  Cervical spine CT revealed degenerative disc and facet disease with no acute bony abnormality.    Labs revealed a hematocrit 20.7, hemoglobin 7.0, MCV 128.6, platelets 162,000, WBC 1100 with an ANC 600.  Peripheral smear revealed a macrocytic anemia with significant anisopoikilocytosis, including elliptocytes, occasional teardrop cells, and a few red cell fragments.  There were no increase in circulating blasts.  She has received 2 units of PRBCs since admission.  CBC reveals a hematocrit of 28.0, hemoglobin 9.9, MCV 107.3, platelets 179,000, and WBC 1400.   Past Medical History:  Diagnosis Date  . Adenocarcinoma in situ of cervix   . Arthritis    hands  . Breast cancer ([place])   . Colon cancer ([place])   . Diabetes mellitus without complication ([place])   . Endometrial carcinoma (H[facility])    s/p total abdominal hysterectomy  . H/O compression fracture of spine 2014   thoracic spine  . H/O polycythemia vera   . H/O TIA (transient ischemic attack) and stroke 09/2014, 03/2015   No deficits  . Hypertension   . Hyperuricemia   . Microalbuminuria   . Myocardial infarction ([place])   . Polycythemia vera ([place])   . Recurrent falls   . Skin cancer    face, legs  . Stroke [facility]) 2008  no deficits  . Trochanteric bursitis   . Varicose veins    treated    Past Surgical History:  Procedure Laterality Date  . ABDOMINAL HYSTERECTOMY    . BOWEL RESECTION N/A 03/28/2015   Procedure: SMALL BOWEL RESECTION;  Surgeon: Leonie Green, MD;  Location: [facility] ORS;  Service: General;  Laterality: N/A;  . CATARACT EXTRACTION W/ INTRAOCULAR LENS IMPLANT Right   . CATARACT EXTRACTION W/PHACO Left 10/07/2015   Procedure: CATARACT EXTRACTION PHACO AND INTRAOCULAR LENS PLACEMENT  (IOC);  Surgeon: Ronnell Freshwater, MD;  Location: [facility];  Service: Ophthalmology;  Laterality: Left;  DIABETIC - oral meds VISION BLUE  . CORONARY ANGIOGRAPHY N/A 10/29/2017   Procedure: CORONARY ANGIOGRAPHY;  Surgeon: Dionisio David, MD;  Location: [place] CV [facility];  Service: Cardiovascular;  Laterality: N/A;  . EXPLORATORY LAPAROTOMY     for fibroids  . HIP ARTHROPLASTY Left 12/01/2018   Procedure: ARTHROPLASTY BIPOLAR HIP (HEMIARTHROPLASTY);  Surgeon: Corky Mull, MD;  Location: [facility] ORS;  Service: Orthopedics;  Laterality: Left;  . LEFT HEART CATH Right 10/29/2017   Procedure: Left Heart Cath;  Surgeon: Dionisio David, MD;  Location: [place] CV LAB;  Service: Cardiovascular;  Laterality: Right;  . TONSILLECTOMY      Family History  Problem Relation Age of Onset  . Cancer Brother        AML  . Heart attack Mother   . Breast cancer Mother   . Heart attack Father   . Diabetes Father   . Heart attack Sister   . Diabetes Sister     Social History:  reports that she has quit smoking. She has never used smokeless tobacco. She reports that she does not drink alcohol or use drugs.  The patient's husband died of AML. She has no family in [place]. She was a principal (grades K through 5).  She is accompanied by her niece, [name],  today who has medical power of attorney with her sister.  Allergies:  Allergies  Allergen Reactions  . Other Other (See Comments)    ALL OPIOIDS   . Simvastatin Other (See Comments)    Myalgia    Medications Prior to Admission  Medication Sig Dispense Refill  . acetaminophen (TYLENOL) 325 MG tablet Take 1-2 tablets (325-650 mg total) by mouth every 6 (six) hours as needed for mild pain (pain score 1-3 or temp > 100.5). (Patient taking differently: Take 650 mg by mouth 3 (three) times daily. )    . amLODipine (NORVASC) 2.5 MG tablet Take 1 tablet (2.5 mg total) by mouth daily. 30 tablet 0  . calcium-vitamin D (OSCAL  WITH D) 500-200 MG-UNIT per tablet Take 1 tablet by mouth daily.     . carvedilol (COREG) 25 MG tablet Take 1 tablet (25 mg total) by mouth 2 (two) times daily with a meal. (Patient taking differently: Take 12.5 mg by mouth 2 (two) times daily with a meal. ) 60 tablet 0  . clopidogrel (PLAVIX) 75 MG tablet Take 1 tablet (75 mg total) by mouth daily. 90 tablet 0  . fexofenadine (ALLEGRA ALLERGY) 180 MG tablet Take 1 tablet (180 mg total) by mouth daily.    . hydroxyurea (HYDREA) 500 MG capsule Take 1 tablet daily except on Saturdays 30 capsule 0  . insulin detemir (LEVEMIR) 100 unit/ml SOLN Inject 0.08 mLs (8 Units total) into the skin at bedtime. (Patient taking differently: Inject 10 Units into the skin at bedtime. ) 8 mL 1  .  pantoprazole (PROTONIX) 40 MG tablet Take 1 tablet (40 mg total) by mouth daily. 30 tablet 0  . pravastatin (PRAVACHOL) 20 MG tablet Take 1 tablet (20 mg total) by mouth at bedtime. 30 tablet 0  . QUEtiapine (SEROQUEL) 50 MG tablet Take 50 mg by mouth at bedtime.     . ramipril (ALTACE) 5 MG capsule Take 5 mg by mouth 2 (two) times daily.    . sertraline (ZOLOFT) 50 MG tablet Take 1 tablet (50 mg total) by mouth daily. 30 tablet 4  . spironolactone (ALDACTONE) 25 MG tablet Take 0.5 tablets (12.5 mg total) by mouth every other day for 30 days. (Patient taking differently: Take 12.5 mg by mouth See admin instructions. Take 12.5 mg by mouth twice daily every other day) 7.5 tablet 0  . nitroGLYCERIN (NITRODUR - DOSED IN MG/24 HR) 0.1 mg/hr patch Place 0.1 mg onto the skin daily.       Review of Systems: GENERAL:  Feels tired.  No fevers, sweats. Possible weight loss (although today's weight 14 pounds up from 11/2018). PERFORMANCE STATUS (ECOG):  3 HEENT:  No visual changes, runny nose, sore throat, mouth sores or tenderness. Lungs: No shortness of breath or cough.  No hemoptysis. Cardiac:  NSTEMI 10/2017.  Ischemic CHF.  EF 25% on 10/29/2017.  No chest pain, palpitations,  orthopnea, or PND. GI:  Poor appetite.  Alternating diarrhea and constipation.  No nausea, vomiting, melena or hematochezia. GU:  No urgency, frequency, dysuria, or hematuria. Musculoskeletal:  S/p left hip fracture 11/2018.  Bilateral knee pain (right > left).  No back pain.  No muscle tenderness. Extremities:  No pain or swelling. Skin:  No rashes or skin changes. Neuro:  S/p cerebellar ICH.  Generalized weakness.  H/o falls.  No headache, focal numbness or weakness. Endocrine:  Diabetes.  No thyroid issues, hot flashes or night sweats. Psych:  Memory loss.  Hallucinations.  No mood changes, depression or anxiety. Pain:  Knee pain. Review of systems:  All other systems reviewed and found to be negative.  Physical Exam:  Blood pressure (!) 177/82, pulse 62, temperature (!) 97.5 F (36.4 C), temperature source Oral, resp. rate 14, height 5' 3"  (1.6 m), weight 120 lb (54.4 kg), SpO2 97 %.  GENERAL:  Thin frail elderly woman lying comfortably on the medical unit in no acute distress.  Her niece is at her side. MENTAL STATUS:  Alert and oriented to person and time (2021).  She is unsure where she is today. HEAD:  Blonde hair.  Normocephalic, atraumatic, face symmetric, no Cushingoid features. EYES:  Blue eyes.  Pupils equal round and reactive to light and accomodation.  No conjunctivitis or scleral icterus. ENT:  Oropharynx clear without lesion.  Tongue normal. Mucous membranes moist.  RESPIRATORY:  Clear to auscultation without rales, wheezes or rhonchi. CARDIOVASCULAR:  Regular rate and rhythm without murmur, rub or gallop. ABDOMEN:  Soft, non-tender, with active bowel sounds, and no hepatosplenomegaly.  No masses. SKIN:  Bruising on arms.  No rashes, ulcers or lesions. EXTREMITIES: Thin arms and legs.  No edema, no skin discoloration or tenderness.  No palpable cords. LYMPH NODES: No palpable cervical, supraclavicular, axillary or inguinal adenopathy  NEUROLOGICAL: Dysarthria (chronic).   Word finding issues (chronic). PSYCH:  Appropriate.   Results for orders placed or performed during the hospital encounter of 07/18/19 (from the past 48 hour(s))  Basic metabolic panel     Status: Abnormal   Collection Time: 07/18/19 12:39 PM  Result Value Ref  Range   Sodium 135 135 - 145 mmol/L   Potassium 4.2 3.5 - 5.1 mmol/L   Chloride 104 98 - 111 mmol/L   CO2 25 22 - 32 mmol/L   Glucose, Bld 160 (H) 70 - 99 mg/dL   BUN 37 (H) 8 - 23 mg/dL   Creatinine, Ser 1.19 (H) 0.44 - 1.00 mg/dL   Calcium 8.8 (L) 8.9 - 10.3 mg/dL   GFR calc non Af Amer 42 (L) >60 mL/min   GFR calc Af Amer 48 (L) >60 mL/min   Anion gap 6 5 - 15    Comment: Performed at [facility], [address] [place], [place] [address]  CBC     Status: Abnormal   Collection Time: 07/18/19 12:39 PM  Result Value Ref Range   WBC 1.1 (LL) 4.0 - 10.5 K/uL    Comment: This critical result has verified and been called to [facility] by Crystal Torain on 01 26 2021 at 1332, and has been read back.  This critical result has verified and been called to [facility] by Crystal Torain on 01 26 2021 at 1333, and has been read back.     RBC 1.69 (L) 3.87 - 5.11 MIL/uL   Hemoglobin 7.1 (L) 12.0 - 15.0 g/dL   HCT 21.5 (L) 36.0 - 46.0 %   MCV 127.2 (H) 80.0 - 100.0 fL   MCH 42.0 (H) 26.0 - 34.0 pg   MCHC 33.0 30.0 - 36.0 g/dL   RDW 17.9 (H) 11.5 - 15.5 %   Platelets 160 150 - 400 K/uL   nRBC 0.0 0.0 - 0.2 %    Comment: Performed at [facility], [address] [place], [place] [address]  Urinalysis, Complete w Microscopic     Status: Abnormal   Collection Time: 07/18/19 12:39 PM  Result Value Ref Range   Color, Urine YELLOW (A) YELLOW   APPearance CLOUDY (A) CLEAR   Specific Gravity, Urine 1.019 1.005 - 1.030   pH 5.0 5.0 - 8.0   Glucose, UA NEGATIVE NEGATIVE mg/dL   Hgb urine dipstick NEGATIVE NEGATIVE   Bilirubin Urine NEGATIVE NEGATIVE   Ketones, ur NEGATIVE NEGATIVE mg/dL   Protein, ur 30  (A) NEGATIVE mg/dL   Nitrite NEGATIVE NEGATIVE   Leukocytes,Ua TRACE (A) NEGATIVE   RBC / HPF 0-5 0 - 5 RBC/hpf   WBC, UA 0-5 0 - 5 WBC/hpf   Bacteria, UA NONE SEEN NONE SEEN   Squamous Epithelial / LPF 0-5 0 - 5   Mucus PRESENT     Comment: Performed at [facility], [address] [place],  [address]  CBC with Differential/Platelet     Status: Abnormal   Collection Time: 07/18/19 12:39 PM  Result Value Ref Range   WBC 1.1 (LL) 4.0 - 10.5 K/uL    Comment: CRITICAL VALUE NOTED.  VALUE IS CONSISTENT WITH PREVIOUSLY REPORTED AND CALLED VALUE.   RBC 1.61 (L) 3.87 - 5.11 MIL/uL   Hemoglobin 7.0 (L) 12.0 - 15.0 g/dL   HCT 20.7 (L) 36.0 - 46.0 %   MCV 128.6 (H) 80.0 - 100.0 fL   MCH 43.5 (H) 26.0 - 34.0 pg   MCHC 33.8 30.0 - 36.0 g/dL   RDW 18.2 (H) 11.5 - 15.5 %   Platelets 162 150 - 400 K/uL   nRBC 0.0 0.0 - 0.2 %   Neutrophils Relative % 51 %   Neutro Abs 0.6 (L) 1.7 - 7.7 K/uL   Lymphocytes Relative  38 %   Lymphs Abs 0.4 (L) 0.7 - 4.0 K/uL   Monocytes Relative 10 %   Monocytes Absolute 0.1 0.1 - 1.0 K/uL   Eosinophils Relative 1 %   Eosinophils Absolute 0.0 0.0 - 0.5 K/uL   Basophils Relative 0 %   Basophils Absolute 0.0 0.0 - 0.1 K/uL   WBC Morphology MORPHOLOGY UNREMARKABLE    Smear Review Normal platelet morphology    Immature Granulocytes 0 %   Abs Immature Granulocytes 0.00 0.00 - 0.07 K/uL   Schistocytes PRESENT     Comment: Performed at [facility], [address] [place], [place] [address]  Procalcitonin - Baseline     Status: None   Collection Time: 07/18/19 12:39 PM  Result Value Ref Range   Procalcitonin <0.10 ng/mL    Comment:        Interpretation: PCT (Procalcitonin) <= 0.5 ng/mL: Systemic infection (sepsis) is not likely. Local bacterial infection is possible. (NOTE)       Sepsis PCT Algorithm           Lower Respiratory Tract                                      Infection PCT Algorithm    ----------------------------      ----------------------------         PCT < 0.25 ng/mL                PCT < 0.10 ng/mL         Strongly encourage             Strongly discourage   discontinuation of antibiotics    initiation of antibiotics    ----------------------------     -----------------------------       PCT 0.25 - 0.50 ng/mL            PCT 0.10 - 0.25 ng/mL               OR       >80% decrease in PCT            Discourage initiation of                                            antibiotics      Encourage discontinuation           of antibiotics    ----------------------------     -----------------------------         PCT >= 0.50 ng/mL              PCT 0.26 - 0.50 ng/mL               AND        <80% decrease in PCT             Encourage initiation of                                             antibiotics       Encourage continuation           of antibiotics    ----------------------------     -----------------------------  PCT >= 0.50 ng/mL                  PCT > 0.50 ng/mL               AND         increase in PCT                  Strongly encourage                                      initiation of antibiotics    Strongly encourage escalation           of antibiotics                                     -----------------------------                                           PCT <= 0.25 ng/mL                                                 OR                                        > 80% decrease in PCT                                     Discontinue / Do not initiate                                             antibiotics Performed at [facility], [address] [place], [place] [address]   Glucose, capillary     Status: Abnormal   Collection Time: 07/18/19 12:43 PM  Result Value Ref Range   Glucose-Capillary 111 (H) 70 - 99 mg/dL  Culture, blood (routine x 2)     Status: None (Preliminary result)   Collection Time: 07/18/19  5:18 PM   Specimen: BLOOD  Result Value Ref Range    Specimen Description BLOOD BLOOD LEFT FOREARM    Special Requests      BOTTLES DRAWN AEROBIC AND ANAEROBIC Blood Culture results may not be optimal due to an excessive volume of blood received in culture bottles   Culture      NO GROWTH < 12 HOURS Performed at [facility], [address], [place], [place] [address]    Report Status PENDING   Culture, blood (routine x 2)     Status: None (Preliminary result)   Collection Time: 07/18/19  5:18 PM   Specimen: BLOOD  Result Value Ref Range   Specimen Description BLOOD BLOOD RIGHT FOREARM    Special Requests      BOTTLES DRAWN AEROBIC AND ANAEROBIC Blood Culture adequate volume   Culture  NO GROWTH < 12 HOURS Performed at [facility], [address] [place], [place] [address]    Report Status PENDING   Lactic acid, plasma     Status: None   Collection Time: 07/18/19  5:18 PM  Result Value Ref Range   Lactic Acid, Venous 1.2 0.5 - 1.9 mmol/L    Comment: Performed at [facility], [address] [place], [place] [address]  Respiratory Panel by RT PCR (Flu A&B, Covid) - Nasopharyngeal Swab     Status: None   Collection Time: 07/18/19  8:40 PM   Specimen: Nasopharyngeal Swab  Result Value Ref Range   SARS Coronavirus 2 by RT PCR NEGATIVE NEGATIVE    Comment: (NOTE) SARS-CoV-2 target nucleic acids are NOT DETECTED. The SARS-CoV-2 RNA is generally detectable in upper respiratoy specimens during the acute phase of infection. The lowest concentration of SARS-CoV-2 viral copies this assay can detect is 131 copies/mL. A negative result does not preclude SARS-Cov-2 infection and should not be used as the sole basis for treatment or other patient management decisions. A negative result may occur with  improper specimen collection/handling, submission of specimen other than nasopharyngeal swab, presence of viral mutation(s) within the areas targeted by this assay, and inadequate number of viral copies (<131  copies/mL). A negative result must be combined with clinical observations, patient history, and epidemiological information. The expected result is Negative. Fact Sheet for Patients:  PinkCheek.be Fact Sheet for Healthcare Providers:  GravelBags.it This test is not yet ap proved or cleared by the [place] FDA and  has been authorized for detection and/or diagnosis of SARS-CoV-2 by FDA under an Emergency Use Authorization (EUA). This EUA will remain  in effect (meaning this test can be used) for the duration of the COVID-19 declaration under Section 564(b)(1) of the Act, 21 U.S.C. section 360bbb-3(b)(1), unless the authorization is terminated or revoked sooner.    Influenza A by PCR NEGATIVE NEGATIVE   Influenza B by PCR NEGATIVE NEGATIVE    Comment: (NOTE) The Xpert Xpress SARS-CoV-2/FLU/RSV assay is intended as an aid in  the diagnosis of influenza from Nasopharyngeal swab specimens and  should not be used as a sole basis for treatment. Nasal washings and  aspirates are unacceptable for Xpert Xpress SARS-CoV-2/FLU/RSV  testing. Fact Sheet for Patients: PinkCheek.be Fact Sheet for Healthcare Providers: GravelBags.it This test is not yet approved or cleared by the [place] FDA and  has been authorized for detection and/or diagnosis of SARS-CoV-2 by  FDA under an Emergency Use Authorization (EUA). This EUA will remain  in effect (meaning this test can be used) for the duration of the  Covid-19 declaration under Section 564(b)(1) of the Act, 21  U.S.C. section 360bbb-3(b)(1), unless the authorization is  terminated or revoked. Performed at [facility], [address] [place], [place] [address]   Glucose, capillary     Status: Abnormal   Collection Time: 07/18/19  8:57 PM  Result Value Ref Range   Glucose-Capillary 174 (H) 70 - 99 mg/dL  Prepare  RBC     Status: None   Collection Time: 07/18/19 11:36 PM  Result Value Ref Range   Order Confirmation      ORDER PROCESSED BY BLOOD BANK Performed at [facility], [address] [place], [place] [address]   Procalcitonin     Status: None   Collection Time: 07/19/19 12:44 AM  Result Value Ref Range   Procalcitonin <0.10 ng/mL    Comment:  Interpretation: PCT (Procalcitonin) <= 0.5 ng/mL: Systemic infection (sepsis) is not likely. Local bacterial infection is possible. (NOTE)       Sepsis PCT Algorithm           Lower Respiratory Tract                                      Infection PCT Algorithm    ----------------------------     ----------------------------         PCT < 0.25 ng/mL                PCT < 0.10 ng/mL         Strongly encourage             Strongly discourage   discontinuation of antibiotics    initiation of antibiotics    ----------------------------     -----------------------------       PCT 0.25 - 0.50 ng/mL            PCT 0.10 - 0.25 ng/mL               OR       >80% decrease in PCT            Discourage initiation of                                            antibiotics      Encourage discontinuation           of antibiotics    ----------------------------     -----------------------------         PCT >= 0.50 ng/mL              PCT 0.26 - 0.50 ng/mL               AND        <80% decrease in PCT             Encourage initiation of                                             antibiotics       Encourage continuation           of antibiotics    ----------------------------     -----------------------------        PCT >= 0.50 ng/mL                  PCT > 0.50 ng/mL               AND         increase in PCT                  Strongly encourage                                      initiation of antibiotics    Strongly encourage escalation           of antibiotics                                     -----------------------------  PCT <= 0.25 ng/mL                                                 OR                                        > 80% decrease in PCT                                     Discontinue / Do not initiate                                             antibiotics Performed at [facility], [address] [place], [place] [address]   Comprehensive metabolic panel     Status: Abnormal   Collection Time: 07/19/19 12:44 AM  Result Value Ref Range   Sodium 137 135 - 145 mmol/L    Comment: LYTES REPEATED/RWW   Potassium 4.0 3.5 - 5.1 mmol/L   Chloride 105 98 - 111 mmol/L   CO2 28 22 - 32 mmol/L   Glucose, Bld 162 (H) 70 - 99 mg/dL   BUN 35 (H) 8 - 23 mg/dL   Creatinine, Ser 1.09 (H) 0.44 - 1.00 mg/dL   Calcium 8.5 (L) 8.9 - 10.3 mg/dL   Total Protein 5.3 (L) 6.5 - 8.1 g/dL   Albumin 3.4 (L) 3.5 - 5.0 g/dL   AST 15 15 - 41 U/L   ALT 20 0 - 44 U/L   Alkaline Phosphatase 32 (L) 38 - 126 U/L   Total Bilirubin 0.7 0.3 - 1.2 mg/dL   GFR calc non Af Amer 46 (L) >60 mL/min   GFR calc Af Amer 54 (L) >60 mL/min   Anion gap 4 (L) 5 - 15    Comment: Performed at [facility], [address] [place], [place] [address]  CBC     Status: Abnormal   Collection Time: 07/19/19 12:44 AM  Result Value Ref Range   WBC 1.2 (LL) 4.0 - 10.5 K/uL    Comment: CRITICAL VALUE NOTED.  VALUE IS CONSISTENT WITH PREVIOUSLY REPORTED AND CALLED VALUE.   RBC 1.56 (L) 3.87 - 5.11 MIL/uL   Hemoglobin 6.8 (L) 12.0 - 15.0 g/dL   HCT 19.7 (L) 36.0 - 46.0 %   MCV 126.3 (H) 80.0 - 100.0 fL   MCH 43.6 (H) 26.0 - 34.0 pg   MCHC 34.5 30.0 - 36.0 g/dL   RDW 18.0 (H) 11.5 - 15.5 %   Platelets 150 150 - 400 K/uL   nRBC 0.0 0.0 - 0.2 %    Comment: Performed at [facility], [address] [place], [place] [address]  Type and screen [place]     Status: None (Preliminary result)   Collection Time: 07/19/19 12:44 AM  Result Value Ref Range   ABO/RH(D) A  POS    Antibody Screen NEG    Sample Expiration 07/22/2019,2359    Unit Number [id number]    Blood Component Type RED CELLS,LR    Unit division 00  Status of Unit ISSUED    Transfusion Status OK TO TRANSFUSE    Crossmatch Result      Compatible Performed at [facility], [address] [place], [place] [address]    Unit Number [id number]    Blood Component Type RED CELLS,LR    Unit division 00    Status of Unit ISSUED    Transfusion Status OK TO TRANSFUSE    Crossmatch Result Compatible    CT Head Wo Contrast  Result Date: 07/18/2019 CLINICAL DATA:  Fall EXAM: CT HEAD WITHOUT CONTRAST TECHNIQUE: Contiguous axial images were obtained from the base of the skull through the vertex without intravenous contrast. COMPARISON:  12/02/2018 FINDINGS: Brain: There is atrophy and chronic small vessel disease changes. No acute intracranial abnormality. Specifically, no hemorrhage, hydrocephalus, mass lesion, acute infarction, or significant intracranial injury. Vascular: No hyperdense vessel or unexpected calcification. Skull: No acute calvarial abnormality. Sinuses/Orbits: Visualized paranasal sinuses and mastoids clear. Orbital soft tissues unremarkable. Other: None IMPRESSION: Atrophy, chronic microvascular disease. No acute intracranial abnormality. Electronically Signed   By: Rolm Baptise M.D.   On: 07/18/2019 19:29   CT Cervical Spine Wo Contrast  Result Date: 07/18/2019 CLINICAL DATA:  Fall EXAM: CT CERVICAL SPINE WITHOUT CONTRAST TECHNIQUE: Multidetector CT imaging of the cervical spine was performed without intravenous contrast. Multiplanar CT image reconstructions were also generated. COMPARISON:  09/27/2018 FINDINGS: Alignment: No subluxation Skull base and vertebrae: No acute fracture. No primary bone lesion or focal pathologic process. Soft tissues and spinal canal: No prevertebral fluid or swelling. No visible canal hematoma. Disc levels: Diffuse degenerative facet  disease bilaterally. Moderate degenerative disc disease most pronounced at C5-6 and C6-7. Upper chest: Ground-glass nodular density in the anterior left upper lobe measures 7 mm and is stable since March of 2019 compatible with benign nodule. No acute findings. Other: None IMPRESSION: Degenerative disc and facet disease.  No acute bony abnormality. Electronically Signed   By: Rolm Baptise M.D.   On: 07/18/2019 19:34   DG Chest Portable 1 View  Result Date: 07/18/2019 CLINICAL DATA:  Hypothermia EXAM: PORTABLE CHEST 1 VIEW COMPARISON:  11/03/2017 FINDINGS: No focal airspace disease or effusion. Mild to moderate cardiomegaly without edema. Tortuous aorta with calcification. No pneumothorax. IMPRESSION: No active disease.  Cardiomegaly. Electronically Signed   By: Donavan Foil M.D.   On: 07/18/2019 18:11    Assessment:  The patient is a 86 y.o. woman with JAK2+ polycythemia rubra vera(PV) diagnosed in 2008. She has been on hydroxyurea.  Last CBC on 12/03/2018 revealed a hematocrit 29.6, hemoglobin 9.4, MCV 96.7, platelets 402,000, WBC 9200.  Diet is poor.  CBC on 07/18/2019 revealed a hematocrit 20.7, hemoglobin 7.0, MCV 128.6, platelets 162,000, WBC 1100 with an ANC 600.  She is s/p 2 units of PRBCs with an appropriate increase in hemoglobin to 9.9.    Peripheral smear revealed a macrocytic anemia with significant anisopoikilocytosis, including elliptocytes, occasional teardrop cells, and a few red cell fragments.  There were no increase in circulating blasts.  She has a history of carcinoid tumor. 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 Largest tumor was 2.5 cm. There were multiple adjacent nodules of a neuroendocrine tumor. Metastasis was in 9 of 12 lymph nodes. Pathologic stage was T3N1.  Chromograninwas 4 on 05/07/2015, 10 on 01/24/2018, and 258.2 on 10/26/2018. 24 hour urine for 5HIAAwas 5.2 mg/24 hours  (0-14.9) and 6.2 on 07/18/2018. Octreotide scanon 05/31/2015 revealed no evidence of  metastatic disease. Abdominal MRI on 07/04/2018 revealed growth of several hepatic lesions concerning for neoplasm.   Symptomatically, she notes fatigue.  She denies any bleeding.  She has had no fevers.   Plan:   1.   Anemia and leukopenia  Etiology felt secondary to ongoing hydroxyurea.  Peripheral smear reveals no blasts.    Retic count is 1.7% (low) c/w marrow hypoproliferative state.  Patient has had no follow-up in the hematology office since 10/26/2018.  TSH normal.  B12 214 (low).  Folate normal.   Begin B12 1000 mcg po q day.   Plan to check levels as an outpatient in 1 month.  Iron studies affected by transfusion.  Follow-up CBC in AM to ensure hemoglobin stable.  Neutropenic precautions.    Continue to hold hydroxyurea.  If counts do not recover in outpatient department, would pursue bone marrow biopsy. 2.   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  Chomogranin had increased to 258 in 10/2018.  She was scheduled for  68-Ga DOTATATE PET scan last year (never performed).  Check chromogranin and 24 hour urine for 5HIAA.  Anticipate follow-up in the outpatient department. 3.   Disposition  Anticipate discharge to the outpatient department once hemoglobin stable.  Will schedule patient to be seen in the hematology clinic in [place] given the difficulty with transportation to [facility].  Thank you for allowing me to participate in [name] 's care.  I will follow her closely with you while hospitalized and after discharge in the outpatient department.   Lequita Asal, MD  07/19/2019, 8:29 AM

## 2019-07-19 NOTE — Progress Notes (Addendum)
Sent messaged via Secure chat to NP Rufina Falco patient blood pressure 167/82 prior to starting blood transfusion and blood pressure is still high 170/73, per patient family patient do take medication for her blood pressure at home. Orders were placed.

## 2019-07-19 NOTE — Progress Notes (Signed)
PT Cancellation Note  Patient Details Name: [name] MRN: [id number] DOB: 06/17/1932   Cancelled Treatment:    Reason Eval/Treat Not Completed: Medical issues which prohibited therapy, Hem. 6.8   [name], PT DPT 07/19/2019, 8:39 AM

## 2019-07-19 NOTE — Progress Notes (Signed)
PHARMACY - PHYSICIAN COMMUNICATION CRITICAL VALUE ALERT - BLOOD CULTURE IDENTIFICATION (BCID)  [name] is an 86 y.o. female who presented to [facility] on 07/18/2019 with a chief complaint of Fall with hip pain   Assessment: Admitted with pancytopenia.  Bcx 1 of 4 GPC. PCT <0.10, afebrile, WBC: 1.4.   Name of physician (or Provider) Contacted: Dr. Arbutus Ped  Current antibiotics: None  Changes to prescribed antibiotics: Will monitor patient off antibiotics for now. MD to reorder blood cultures.    No results found for this or any previous visit.  Pernell Dupre, PharmD, BCPS Clinical Pharmacist 07/19/2019 1:07 PM

## 2019-07-19 NOTE — Progress Notes (Addendum)
PROGRESS NOTE    [name]  [id number] DOB: [birth year] DOA: 07/18/2019  PCP: Baxter Hire, MD    LOS - 1   Brief Narrative:   86 y.o. female with medical history significant of polycythemia vera on hydroxyurea, diabetes, endometrial carcinoma, degenerative disc disease, TIAs, previous CVAs who was brought in from home after a mechanical fall, due to persistent generalized weakness recently.  Labs showed leukopenia (WBC 1k) and anemia (Hbg 7.0) with relatively preserved platelet count of 166k.  Suspect these abnormalities could be secondary to hydroxyurea toxicity.  Has not followed with hematology in quite some time.  Patient admitted to hospitalist service with oncology consulted.  Two units packed RBC's transfused overnight 1/26-27.   Subjective 1/27: Patient seen this AM, daughter at bedside.  Daughter reports patient hallucinates at night recently.  Last night thought someone was under the bed.  Had recently been started on Seroquel for this.  Patient denies complaints except for feeling tired.  Denies pain, fever/chills, N/V/D, CP or other complaints.  No acute events reported.   Assessment & Plan:   Principal Problem:   Pancytopenia ([place]) Active Problems:   Cancer of right colon ([place])   Polycythemia vera ([place])   History of cervical cancer   Essential hypertension   Hyperlipidemia   Diabetes ([place])   Leukopenia Anemia Etiology to be determined, possibly 2/2 hydroxyurea.  Status post 2 units RBC transfusion 1/26-27. --oncology consulted, appreciate recommendations  --monitor CBC closely --follow up PBS --no antibiotics for now, no source of infection and patient afebrile --neutropenic precautions --supportive care  Essential Hypertension -  --Continue home amlodipine, Coreg, ramipril  Hyperlipidemia --Continue home statin  Type 2 Diabetes --continue home Levemir at reduced dose 5 units (home dose 8 units) at bedtime --sliding scale  Novolog  Depression/Anxiety --continue home Prozac, Seroquel  History of TIA/CVA's --continue ASA and Plavix, statin  GERD --continue PPI  Seasonal Allergies --continue loratadine  DVT prophylaxis: SCD's   Code Status: DNR  Family Communication: daughter at bedside during encounter  Disposition Plan:  Pending improvement and clearance by hematology/oncology   Consultants:   Hematology, Dr. Mike Gip  Procedures:   RBC transfusion  Antimicrobials:   None    Objective: Vitals:   07/19/19 0621 07/19/19 0830 07/19/19 0844 07/19/19 0925  BP: (!) 177/82  (!) 154/61 (!) 163/69  Pulse: 62 63 65 69  Resp: 14 18    Temp: (!) 97.5 F (36.4 C) 97.7 F (36.5 C) 97.7 F (36.5 C) (!) 97.5 F (36.4 C)  TempSrc: Oral  Oral Oral  SpO2: 97%  98% 100%  Weight:      Height:        Intake/Output Summary (Last 24 hours) at 07/19/2019 1129 Last data filed at 07/19/2019 0606 Gross per 24 hour  Intake 1034 ml  Output --  Net 1034 ml   Filed Weights   07/18/19 1234  Weight: 54.4 kg    Examination:  General exam: awake, alert, no acute distress, frail HEENT: clear conjunctiva, anicteric sclera, moist mucus membranes, hearing grossly normal  Respiratory system: clear to auscultation bilaterally, no wheezes, rales or rhonchi, normal respiratory effort. Cardiovascular system: normal S1/S2, RRR, no pedal edema.   Gastrointestinal system: soft, non-tender, non-distended abdomen, normal bowel sounds. Central nervous system: alert and oriented x3. no gross focal neurologic deficits, normal speech Extremities: moves all, no cyanosis, normal tone Skin: dry, intact, normal temperature Psychiatry: normal mood, congruent affect, judgement and insight appear normal    Data  Reviewed: I have personally reviewed following labs and imaging studies  CBC: Recent Labs  Lab 07/18/19 1239 07/19/19 0044  WBC 1.1*  1.1* 1.2*  NEUTROABS 0.6*  --   HGB 7.0*  7.1* 6.8*  HCT 20.7*   21.5* 19.7*  MCV 128.6*  127.2* 126.3*  PLT 162  160 [id number]   Basic Metabolic Panel: Recent Labs  Lab 07/18/19 1239 07/19/19 0044  NA 135 137  K 4.2 4.0  CL 104 105  CO2 25 28  GLUCOSE 160* 162*  BUN 37* 35*  CREATININE 1.19* 1.09*  CALCIUM 8.8* 8.5*   GFR: Estimated Creatinine Clearance: 31.2 mL/min (A) (by C-G formula based on SCr of 1.09 mg/dL (H)). Liver Function Tests: Recent Labs  Lab 07/19/19 0044  AST 15  ALT 20  ALKPHOS 32*  BILITOT 0.7  PROT 5.3*  ALBUMIN 3.4*   No results for input(s): LIPASE, AMYLASE in the last 168 hours. No results for input(s): AMMONIA in the last 168 hours. Coagulation Profile: No results for input(s): INR, PROTIME in the last 168 hours. Cardiac Enzymes: No results for input(s): CKTOTAL, CKMB, CKMBINDEX, TROPONINI in the last 168 hours. BNP (last 3 results) No results for input(s): PROBNP in the last 8760 hours. HbA1C: Recent Labs    07/18/19 2154  HGBA1C 5.3   CBG: Recent Labs  Lab 07/18/19 1243 07/18/19 2057 07/19/19 0838  GLUCAP 111* 174* 153*   Lipid Profile: No results for input(s): CHOL, HDL, LDLCALC, TRIG, CHOLHDL, LDLDIRECT in the last 72 hours. Thyroid Function Tests: No results for input(s): TSH, T4TOTAL, FREET4, T3FREE, THYROIDAB in the last 72 hours. Anemia Panel: No results for input(s): VITAMINB12, FOLATE, FERRITIN, TIBC, IRON, RETICCTPCT in the last 72 hours. Sepsis Labs: Recent Labs  Lab 07/18/19 1239 07/18/19 1718 07/19/19 0044  PROCALCITON <0.10  --  <0.10  LATICACIDVEN  --  1.2  --     Recent Results (from the past 240 hour(s))  Culture, blood (routine x 2)     Status: None (Preliminary result)   Collection Time: 07/18/19  5:18 PM   Specimen: BLOOD  Result Value Ref Range Status   Specimen Description BLOOD BLOOD LEFT FOREARM  Final   Special Requests   Final    BOTTLES DRAWN AEROBIC AND ANAEROBIC Blood Culture results may not be optimal due to an excessive volume of blood received in  culture bottles   Culture   Final    NO GROWTH < 12 HOURS Performed at [facility], [address], [place], [place] [address]    Report Status PENDING  Incomplete  Culture, blood (routine x 2)     Status: None (Preliminary result)   Collection Time: 07/18/19  5:18 PM   Specimen: BLOOD  Result Value Ref Range Status   Specimen Description BLOOD BLOOD RIGHT FOREARM  Final   Special Requests   Final    BOTTLES DRAWN AEROBIC AND ANAEROBIC Blood Culture adequate volume   Culture   Final    NO GROWTH < 12 HOURS Performed at [facility], [address], [place], [place] [address]    Report Status PENDING  Incomplete  Respiratory Panel by RT PCR (Flu A&B, Covid) - Nasopharyngeal Swab     Status: None   Collection Time: 07/18/19  8:40 PM   Specimen: Nasopharyngeal Swab  Result Value Ref Range Status   SARS Coronavirus 2 by RT PCR NEGATIVE NEGATIVE Final    Comment: (NOTE) SARS-CoV-2 target nucleic acids are NOT DETECTED. The SARS-CoV-2  RNA is generally detectable in upper respiratoy specimens during the acute phase of infection. The lowest concentration of SARS-CoV-2 viral copies this assay can detect is 131 copies/mL. A negative result does not preclude SARS-Cov-2 infection and should not be used as the sole basis for treatment or other patient management decisions. A negative result may occur with  improper specimen collection/handling, submission of specimen other than nasopharyngeal swab, presence of viral mutation(s) within the areas targeted by this assay, and inadequate number of viral copies (<131 copies/mL). A negative result must be combined with clinical observations, patient history, and epidemiological information. The expected result is Negative. Fact Sheet for Patients:  PinkCheek.be Fact Sheet for Healthcare Providers:  GravelBags.it This test is not yet ap proved or cleared by the [place] FDA and  has been authorized for detection and/or diagnosis of SARS-CoV-2 by FDA under an Emergency Use Authorization (EUA). This EUA will remain  in effect (meaning this test can be used) for the duration of the COVID-19 declaration under Section 564(b)(1) of the Act, 21 U.S.C. section 360bbb-3(b)(1), unless the authorization is terminated or revoked sooner.    Influenza A by PCR NEGATIVE NEGATIVE Final   Influenza B by PCR NEGATIVE NEGATIVE Final    Comment: (NOTE) The Xpert Xpress SARS-CoV-2/FLU/RSV assay is intended as an aid in  the diagnosis of influenza from Nasopharyngeal swab specimens and  should not be used as a sole basis for treatment. Nasal washings and  aspirates are unacceptable for Xpert Xpress SARS-CoV-2/FLU/RSV  testing. Fact Sheet for Patients: PinkCheek.be Fact Sheet for Healthcare Providers: GravelBags.it This test is not yet approved or cleared by the [place] FDA and  has been authorized for detection and/or diagnosis of SARS-CoV-2 by  FDA under an Emergency Use Authorization (EUA). This EUA will remain  in effect (meaning this test can be used) for the duration of the  Covid-19 declaration under Section 564(b)(1) of the Act, 21  U.S.C. section 360bbb-3(b)(1), unless the authorization is  terminated or revoked. Performed at [facility], [address] [place], [place] [address]          Radiology Studies: CT Head Wo Contrast  Result Date: 07/18/2019 CLINICAL DATA:  Fall EXAM: CT HEAD WITHOUT CONTRAST TECHNIQUE: Contiguous axial images were obtained from the base of the skull through the vertex without intravenous contrast. COMPARISON:  12/02/2018 FINDINGS: Brain: There is atrophy and chronic small vessel disease changes. No acute intracranial abnormality. Specifically, no hemorrhage, hydrocephalus, mass lesion, acute infarction, or significant intracranial injury. Vascular:  No hyperdense vessel or unexpected calcification. Skull: No acute calvarial abnormality. Sinuses/Orbits: Visualized paranasal sinuses and mastoids clear. Orbital soft tissues unremarkable. Other: None IMPRESSION: Atrophy, chronic microvascular disease. No acute intracranial abnormality. Electronically Signed   By: Rolm Baptise M.D.   On: 07/18/2019 19:29   CT Cervical Spine Wo Contrast  Result Date: 07/18/2019 CLINICAL DATA:  Fall EXAM: CT CERVICAL SPINE WITHOUT CONTRAST TECHNIQUE: Multidetector CT imaging of the cervical spine was performed without intravenous contrast. Multiplanar CT image reconstructions were also generated. COMPARISON:  09/27/2018 FINDINGS: Alignment: No subluxation Skull base and vertebrae: No acute fracture. No primary bone lesion or focal pathologic process. Soft tissues and spinal canal: No prevertebral fluid or swelling. No visible canal hematoma. Disc levels: Diffuse degenerative facet disease bilaterally. Moderate degenerative disc disease most pronounced at C5-6 and C6-7. Upper chest: Ground-glass nodular density in the anterior left upper lobe measures 7 mm and is stable since March of 2019 compatible with benign  nodule. No acute findings. Other: None IMPRESSION: Degenerative disc and facet disease.  No acute bony abnormality. Electronically Signed   By: Rolm Baptise M.D.   On: 07/18/2019 19:34   DG Chest Portable 1 View  Result Date: 07/18/2019 CLINICAL DATA:  Hypothermia EXAM: PORTABLE CHEST 1 VIEW COMPARISON:  11/03/2017 FINDINGS: No focal airspace disease or effusion. Mild to moderate cardiomegaly without edema. Tortuous aorta with calcification. No pneumothorax. IMPRESSION: No active disease.  Cardiomegaly. Electronically Signed   By: Donavan Foil M.D.   On: 07/18/2019 18:11        Scheduled Meds: . sodium chloride   Intravenous Once  . amLODipine  2.5 mg Oral Daily  . calcium-vitamin D  1 tablet Oral Daily  . carvedilol  12.5 mg Oral BID WC  . clopidogrel  75  mg Oral Daily  . insulin aspart  0-9 Units Subcutaneous TID WC  . loratadine  10 mg Oral Daily  . nitroGLYCERIN  0.1 mg Transdermal Daily  . pantoprazole  40 mg Oral Daily  . pravastatin  20 mg Oral QHS  . QUEtiapine  50 mg Oral QHS   Continuous Infusions: . sodium chloride       LOS: 1 day    Time spent: 35 minutes    Ezekiel Slocumb, DO [facility]   If 7PM-7AM, please contact night-coverage [email or web address] 07/19/2019, 11:29 AM

## 2019-07-19 NOTE — Plan of Care (Signed)

## 2019-07-19 NOTE — Progress Notes (Signed)
Via Secure Chat notified NP Rufina Falco that attending MD placed order for blood transfusion but did not place orders for a Type and screen. Order were placed.

## 2019-07-20 ENCOUNTER — Other Ambulatory Visit: Payer: Self-pay | Admitting: Hospice and Palliative Medicine

## 2019-07-20 DIAGNOSIS — D45 Polycythemia vera: Secondary | ICD-10-CM

## 2019-07-20 DIAGNOSIS — D61818 Other pancytopenia: Principal | ICD-10-CM

## 2019-07-20 DIAGNOSIS — Z515 Encounter for palliative care: Secondary | ICD-10-CM

## 2019-07-20 LAB — CBC WITH DIFFERENTIAL/PLATELET
Abs Immature Granulocytes: 0.01 10*3/uL (ref 0.00–0.07)
Basophils Absolute: 0 10*3/uL (ref 0.0–0.1)
Basophils Relative: 1 %
Eosinophils Absolute: 0 10*3/uL (ref 0.0–0.5)
Eosinophils Relative: 1 %
HCT: 29.3 % — ABNORMAL LOW (ref 36.0–46.0)
Hemoglobin: 10.4 g/dL — ABNORMAL LOW (ref 12.0–15.0)
Immature Granulocytes: 1 %
Lymphocytes Relative: 41 %
Lymphs Abs: 0.7 10*3/uL (ref 0.7–4.0)
MCH: 38.8 pg — ABNORMAL HIGH (ref 26.0–34.0)
MCHC: 35.5 g/dL (ref 30.0–36.0)
MCV: 109.3 fL — ABNORMAL HIGH (ref 80.0–100.0)
Monocytes Absolute: 0.2 10*3/uL (ref 0.1–1.0)
Monocytes Relative: 14 %
Neutro Abs: 0.7 10*3/uL — ABNORMAL LOW (ref 1.7–7.7)
Neutrophils Relative %: 42 %
Platelets: 170 10*3/uL (ref 150–400)
RBC: 2.68 MIL/uL — ABNORMAL LOW (ref 3.87–5.11)
RDW: 26.5 % — ABNORMAL HIGH (ref 11.5–15.5)
Smear Review: NORMAL
WBC: 1.6 10*3/uL — ABNORMAL LOW (ref 4.0–10.5)
nRBC: 0 % (ref 0.0–0.2)

## 2019-07-20 LAB — BPAM RBC
Blood Product Expiration Date: 202101302359
Blood Product Expiration Date: 202102112359
Blood Product Expiration Date: 202102142359
ISSUE DATE / TIME: 202101270225
ISSUE DATE / TIME: 202101270556
ISSUE DATE / TIME: 202101271457
Unit Type and Rh: 6200
Unit Type and Rh: 6200
Unit Type and Rh: 9500

## 2019-07-20 LAB — TYPE AND SCREEN
ABO/RH(D): A POS
Antibody Screen: NEGATIVE
Unit division: 0
Unit division: 0
Unit division: 0

## 2019-07-20 LAB — CHROMOGRANIN A: Chromogranin A (ng/mL): 440.4 ng/mL — ABNORMAL HIGH (ref 0.0–101.8)

## 2019-07-20 LAB — GLUCOSE, CAPILLARY
Glucose-Capillary: 96 mg/dL (ref 70–99)
Glucose-Capillary: 97 mg/dL (ref 70–99)

## 2019-07-20 LAB — BASIC METABOLIC PANEL
Anion gap: 9 (ref 5–15)
BUN: 29 mg/dL — ABNORMAL HIGH (ref 8–23)
CO2: 26 mmol/L (ref 22–32)
Calcium: 8.9 mg/dL (ref 8.9–10.3)
Chloride: 105 mmol/L (ref 98–111)
Creatinine, Ser: 0.96 mg/dL (ref 0.44–1.00)
GFR calc Af Amer: >60 mL/min (ref >60)
GFR calc non Af Amer: 54 mL/min — ABNORMAL LOW (ref >60)
Glucose, Bld: 93 mg/dL (ref 70–99)
Potassium: 3.9 mmol/L (ref 3.5–5.1)
Sodium: 140 mmol/L (ref 135–145)

## 2019-07-20 LAB — MAGNESIUM: Magnesium: 1.8 mg/dL (ref 1.7–2.4)

## 2019-07-20 LAB — PROCALCITONIN: Procalcitonin: <0.1 ng/mL

## 2019-07-20 MED ORDER — AMLODIPINE BESYLATE 5 MG PO TABS
5.0000 mg | ORAL_TABLET | Freq: Every day | ORAL | Status: DC
  Administered 2019-07-20: 5 mg via ORAL
  Filled 2019-07-20: qty 1

## 2019-07-20 MED ORDER — SERTRALINE HCL 50 MG PO TABS
50.0000 mg | ORAL_TABLET | Freq: Every day | ORAL | Status: DC
  Administered 2019-07-20: 50 mg via ORAL
  Filled 2019-07-20: qty 1

## 2019-07-20 MED ORDER — HALOPERIDOL LACTATE 5 MG/ML IJ SOLN
2.0000 mg | Freq: Once | INTRAMUSCULAR | Status: AC
  Administered 2019-07-20: 2 mg via INTRAVENOUS
  Filled 2019-07-20: qty 1

## 2019-07-20 MED ORDER — AMLODIPINE BESYLATE 5 MG PO TABS: 5.0000 mg | ORAL_TABLET | Freq: Every day | ORAL | 1 refills | Status: DC

## 2019-07-20 MED ORDER — CYANOCOBALAMIN 1000 MCG PO TABS: 1000.0000 ug | ORAL_TABLET | Freq: Every day | ORAL | 1 refills | Status: DC

## 2019-07-20 MED ORDER — HYDRALAZINE HCL 25 MG PO TABS: 25.0000 mg | ORAL_TABLET | Freq: Four times a day (QID) | ORAL | Status: DC | PRN

## 2019-07-20 NOTE — Progress Notes (Signed)
Patient sleeping this RN attempts to awaken patient. Patient arousalable but then drifts back off. VSS. Discussed plan of care with family at bedside.

## 2019-07-20 NOTE — Progress Notes (Signed)
Patient sleeping at this current time. Hourly rounding performed.Family at bedside.

## 2019-07-20 NOTE — Evaluation (Signed)
Physical Therapy Evaluation Patient Details Name: [name] MRN: [id number] DOB: 02/01/1932 Today's Date: 07/20/2019   History of Present Illness  Per MD note: Pt is an 86 y.o. female with medical history significant of polycythemia vera on hydroxyurea, diabetes, endometrial carcinoma, degenerative disc disease, TIAs, previous CVAs who was brought in from home after a mechanical fall, due to persistent generalized weakness recently.  Labs showed leukopenia and anemia with relatively preserved platelet count of 166k.  Suspect these abnormalities could be secondary to hydroxyurea toxicity.  MD assessment includes: Leukopenia, anemia, Dementia with behavioral disturbances, HTN, HLD, and DM II.    Clinical Impression  Pt found sleeping soundly upon entering room but became alert quickly with gentle conversation.  Pt was able to perform all functional mobility tasks with min A or less and was pleasant and able to follow 1-step commands throughout the session. Pt did present with min instability during amb with niece who was present for the session reporting multiple falls in the last 6 months.  Pt will benefit from HHPT services upon discharge to safely address deficits listed in patient problem list for decreased caregiver assistance, decreased fall risk, and eventual return to PLOF.      Follow Up Recommendations Supervision/Assistance - 24 hour;Home health PT (HHPT set up just prior to this admission per pt's niece)    Equipment Recommendations  None recommended by PT    Recommendations for Other Services       Precautions / Restrictions Precautions Precautions: Fall Restrictions Weight Bearing Restrictions: No      Mobility  Bed Mobility Overal bed mobility: Needs Assistance Bed Mobility: Supine to Sit     Supine to sit: Min assist     General bed mobility comments: Min A for BLE and trunk control  Transfers Overall transfer level: Needs assistance Equipment used: Rolling  walker (2 wheeled) Transfers: Sit to/from Stand Sit to Stand: Min guard         General transfer comment: MIN verbal cues for hand placement  Ambulation/Gait Ambulation/Gait assistance: Min guard Gait Distance (Feet): 30 Feet x 2 Assistive device: Rolling walker (2 wheeled) Gait Pattern/deviations: Step-through pattern;Decreased step length - right;Decreased step length - left;Drifts right/left Gait velocity: decreased   General Gait Details: Slow cadence with min drifting left/right but no LOB  Stairs            Wheelchair Mobility    Modified Rankin (Stroke Patients Only)       Balance Overall balance assessment: Needs assistance Sitting-balance support: Bilateral upper extremity supported Sitting balance-Leahy Scale: Good     Standing balance support: Bilateral upper extremity supported;During functional activity Standing balance-Leahy Scale: Fair Standing balance comment: Mod lean on the RW for support                             Pertinent Vitals/Pain Pain Assessment: No/denies pain    Home Living Family/patient expects to be discharged to:: Private residence Living Arrangements: Alone Available Help at Discharge: Personal care attendant;Available 24 hours/day Type of Home: House Home Access: Stairs to enter Entrance Stairs-Rails: Left Entrance Stairs-Number of Steps: 2 Home Layout: One level Home Equipment: Walker - 2 wheels Additional Comments: History obtained with assistance from niece secondary to pt with cognitive deficits    Prior Function Level of Independence: Needs assistance   Gait / Transfers Assistance Needed: Pt able to amb limited community distances with RW and SBA from PCA, Ind with  bed mob and transfers, 2 falls in the last 6 months  ADL's / Homemaking Assistance Needed: Pt requires assist with all ADLs from PCA        Hand Dominance   Dominant Hand: Right    Extremity/Trunk Assessment   Upper Extremity  Assessment Upper Extremity Assessment: Generalized weakness;Defer to OT evaluation    Lower Extremity Assessment Lower Extremity Assessment: Generalized weakness       Communication   Communication: Expressive difficulties  Cognition Arousal/Alertness: Awake/alert Behavior During Therapy: Flat affect Overall Cognitive Status: History of cognitive impairments - at baseline                                        General Comments      Exercises Total Joint Exercises Ankle Circles/Pumps: Strengthening;AROM;Both;10 reps Quad Sets: Strengthening;Both;10 reps Gluteal Sets: Strengthening;Both;10 reps Long Arc Quad: AROM;Strengthening;Both;10 reps Knee Flexion: AROM;Strengthening;Both;10 reps Other Exercises Other Exercises: HEP education for BLE APs, QS, and GS x 10 each every 1-2 hours   Assessment/Plan    PT Assessment Patient needs continued PT services  PT Problem List Decreased strength;Decreased activity tolerance;Decreased balance;Decreased mobility       PT Treatment Interventions DME instruction;Gait training;Stair training;Functional mobility training;Therapeutic activities;Therapeutic exercise;Balance training;Patient/family education    PT Goals (Current goals can be found in the Care Plan section)  Acute Rehab PT Goals Patient Stated Goal: To go home PT Goal Formulation: With patient Time For Goal Achievement: 08/02/19 Potential to Achieve Goals: Good    Frequency Min 2X/week   Barriers to discharge        Co-evaluation               AM-PAC PT "6 Clicks" Mobility  Outcome Measure Help needed turning from your back to your side while in a flat bed without using bedrails?: A Little Help needed moving from lying on your back to sitting on the side of a flat bed without using bedrails?: A Little Help needed moving to and from a bed to a chair (including a wheelchair)?: A Little Help needed standing up from a chair using your arms  (e.g., wheelchair or bedside chair)?: A Little Help needed to walk in hospital room?: A Little Help needed climbing 3-5 steps with a railing? : A Lot 6 Click Score: 17    End of Session Equipment Utilized During Treatment: Gait belt Activity Tolerance: Patient tolerated treatment well Patient left: in chair;with call bell/phone within reach;with chair alarm set;with family/visitor present Nurse Communication: Mobility status PT Visit Diagnosis: History of falling (Z91.81);Muscle weakness (generalized) (M62.81);Difficulty in walking, not elsewhere classified (R26.2)    Time: [id number] PT Time Calculation (min) (ACUTE ONLY): 37 min   Charges:   PT Evaluation $PT Eval Moderate Complexity: 1 Mod PT Treatments $Therapeutic Exercise: 8-22 mins        D. Royetta Asal PT, DPT 07/20/19, 1:54 PM

## 2019-07-20 NOTE — TOC Progression Note (Signed)
Transition of Care [facility]) - Progression Note    Patient Details  Name: MARCELYN PULLAR MRN: [id number] Date of Birth: 1931/12/19  Transition of Care [facility]) CM/SW Contact  Katrell Milhorn, [name], LCSW Phone Number: 07/20/2019, 3:30 PM  Clinical Narrative:   Spoke with niece who did speak with Palliative-Josh and both pt and niece feel and want to go home today if possible. Pt is more lucid and wants to go home if MD will let her. Secure chat sent to MD awaiting response.    Expected Discharge Plan: [place] Barriers to Discharge: Continued Medical Work up  Expected Discharge Plan and Services Expected Discharge Plan: [place] In-house Referral: Clinical Social Work Discharge Planning Services: Other - See comment(Niece wants to ge ther home but be safe for all invoilved) Post Acute Care Choice: Home Health                                         Social Determinants of Health (SDOH) Interventions    Readmission Risk Interventions Readmission Risk Prevention Plan 12/04/2018  Transportation Screening Complete  Medication Review [employer]) Complete  PCP or Specialist appointment within 3-5 days of discharge Complete  HRI or Home Care Consult Complete  SW Recovery Care/Counseling Consult Complete  Palliative Care Screening Complete  Skilled Nursing Facility Complete  Some recent data might be hidden

## 2019-07-20 NOTE — TOC Transition Note (Signed)
Transition of Care [facility]) - CM/SW Discharge Note   Patient Details  Name: [name] MRN: [id number] Date of Birth: 24-Nov-1931  Transition of Care [facility]) CM/SW Contact:  Elease Hashimoto, LCSW Phone Number: 07/20/2019, 3:42 PM   Clinical Narrative: MD met with pt and niece and is in agreement with her discharging today. Will resume [facility] services-PT,SP, RN, pt has all needed equipment. Niece feels can transport via car as long as assisted into car since all equipment is at home. Pt will have 24 hr care at home via hired assist. Palliative to follow also as an OP. Pt doing much better at this time and converses with this worker and MD. No further needs.    Final next level of care: Home w Home Health Services Barriers to Discharge: Barriers Resolved   Patient Goals and CMS Choice Patient states their goals for this hospitalization and ongoing recovery are:: Niece here pt unable to participate due to sleeping. Niece wants to get her home with care and her caregivers since this is where she wants to be      Discharge Placement                       Discharge Plan and Services In-house Referral: Clinical Social Work Discharge Planning Services: Other - See comment(Niece wants to ge ther home but be safe for all invoilved) Post Acute Care Choice: Home Health                    HH Arranged: PT, OT, RN, Speech Therapy HH Agency: [facility] Date [place]: 07/20/19 Time HH Agency Contacted: 1400 Representative spoke with at [place]: [place] ([place]) Interventions     Readmission Risk Interventions Readmission Risk Prevention Plan 12/04/2018  Transportation Screening Complete  Medication Review Press photographer) Complete  PCP or Specialist appointment within 3-5 days of discharge Complete  HRI or Home Care Consult Complete  SW Recovery Care/Counseling Consult Complete  Palliative Care Screening Complete  Skilled  Nursing Facility Complete  Some recent data might be hidden

## 2019-07-20 NOTE — Progress Notes (Signed)
Patient alert to self OOB in chair. Patient more awake and  took oral medications without complications.

## 2019-07-20 NOTE — Progress Notes (Signed)
Patient alert to self and trying to get OOB. Family at bedside. Night shift RN notified MD new orders giving.

## 2019-07-20 NOTE — Progress Notes (Signed)
PROGRESS NOTE    [name]  [id number] DOB: Nov 21, 1931 DOA: 07/18/2019  PCP: Baxter Hire, MD    LOS - 2   Brief Narrative:  86 y.o.femalewith medical history significant ofpolycythemia vera on hydroxyurea, diabetes, endometrial carcinoma, degenerative disc disease, TIAs, previous CVAs who was brought in from home after a mechanical fall, due to persistent generalized weakness recently. Labs showed leukopenia (WBC 1k) and anemia (Hbg 7.0) with relatively preserved platelet count of 166k.  Suspect these abnormalities could be secondary to hydroxyurea toxicity.  Has not followed with hematology in quite some time.  Patient admitted to hospitalist service with oncology consulted.  Two units packed RBC's transfused overnight 1/26-27.  Subjective 1/28: Patient sleeping comfortably this AM.  Niece at bedside stayed overnight.  Patient was very agitated overnight, required haldol.  Confused when she is awake this AM, but mostly has been sleeping.  When awake, asks to go home.  Appear in no distress during encounter.  Discussed palliative consult with niece, agreeable.  Niece expressed concern about patient's agitation and worsening sundowning episodes.  Assessment & Plan:   Principal Problem:   Pancytopenia ([place]) Active Problems:   Cancer of right colon ([place])   Polycythemia vera ([place])   History of cervical cancer   Essential hypertension   Hyperlipidemia   Diabetes ([employer])   B12 deficiency   Leukopenia  Anemia - improved Etiology most likely 2/2 hydroxyurea.  Status post 2 units RBC transfusion 1/26-27. Hemoglobin stable and improved to 10.4 this AM from nadir 6.8 --oncology consulted, appreciate recommendations  --will have patient follow up with hematology here in [facility] clinic --monitor CBC closely --no antibiotics for now, no source of infection and patient afebrile --neutropenic precautions --supportive care  Dementia with behavioral disturbances Patient  having increasing episodes of sundowning with hallucinations and agitated behaviors at night.  This was starting at home and has continued and worsened somewhat during admission.   --Palliative consult --TOC following for discharge planning  --low dose haldol ONLY if needed for patient's safety  Essential Hypertension -  --Continue home amlodipine, Coreg, ramipril  Hyperlipidemia --Continue home statin  Type 2 Diabetes --continue home Levemir at reduced dose 5 units (home dose 8 units) at bedtime --sliding scale Novolog  Depression/Anxiety --continue home Prozac, Seroquel  History of TIA/CVA's --continue ASA and Plavix, statin  GERD --continue PPI  Seasonal Allergies --continue loratadine  DVT prophylaxis: SCD's   Code Status: DNR  Family Communication: daughter at bedside during encounter  Disposition Plan:  Pending discharge plans to return home and palliative consult.     Consultants:   Hematology, Dr. Mike Gip  Procedures:   RBC transfusion  Antimicrobials:   None    Objective: Vitals:   07/19/19 1545 07/20/19 0551 07/20/19 0748 07/20/19 1040  BP: (!) 154/93 (!) 193/86 (!) 189/74 (!) 178/95  Pulse: 61 75 (!) 57 71  Resp:  14 16   Temp:  97.6 F (36.4 C) (!) 97.5 F (36.4 C)   TempSrc: Oral Oral Axillary   SpO2: 97% 99% 96%   Weight:      Height:        Intake/Output Summary (Last 24 hours) at 07/20/2019 1303 Last data filed at 07/19/2019 1700 Gross per 24 hour  Intake 240 ml  Output --  Net 240 ml   Filed Weights   07/18/19 1234  Weight: 54.4 kg    Examination:  General exam: sleeping comfortably, arousable but falls back to sleep, no acute distress, frail Respiratory system:  clear to auscultation bilaterally, no wheezes, rales or rhonchi, normal respiratory effort. Cardiovascular system: normal [id number], RRR, systolic murmurs, no pedal edema.   Central nervous system: no gross focal neurologic deficits, follows  commands Extremities: no cyanosis, no edema, normal tone Skin: dry, intact, normal temperature Psychiatry: normal mood, congruent affect, judgement and insight appear normal    Data Reviewed: I have personally reviewed following labs and imaging studies  CBC: Recent Labs  Lab 07/18/19 1239 07/19/19 0044 07/19/19 1125 07/20/19 0650  WBC 1.1*  1.1* 1.2* 1.4* 1.6*  NEUTROABS 0.6*  --   --  0.7*  HGB 7.0*  7.1* 6.8* 9.9* 10.4*  HCT 20.7*  21.5* 19.7* 28.0* 29.3*  MCV 128.6*  127.2* 126.3* 107.3* 109.3*  PLT 162  160 150 179 [id number]   Basic Metabolic Panel: Recent Labs  Lab 07/18/19 1239 07/19/19 0044 07/20/19 0650  NA 135 137 140  K 4.2 4.0 3.9  CL 104 105 105  CO2 25 28 26   GLUCOSE 160* 162* 93  BUN 37* 35* 29*  CREATININE 1.19* 1.09* 0.96  CALCIUM 8.8* 8.5* 8.9  MG  --   --  1.8   GFR: Estimated Creatinine Clearance: 35.4 mL/min (by C-G formula based on SCr of 0.96 mg/dL). Liver Function Tests: Recent Labs  Lab 07/19/19 0044  AST 15  ALT 20  ALKPHOS 32*  BILITOT 0.7  PROT 5.3*  ALBUMIN 3.4*   No results for input(s): LIPASE, AMYLASE in the last 168 hours. No results for input(s): AMMONIA in the last 168 hours. Coagulation Profile: No results for input(s): INR, PROTIME in the last 168 hours. Cardiac Enzymes: No results for input(s): CKTOTAL, CKMB, CKMBINDEX, TROPONINI in the last 168 hours. BNP (last 3 results) No results for input(s): PROBNP in the last 8760 hours. HbA1C: Recent Labs    07/18/19 2154  HGBA1C 5.3   CBG: Recent Labs  Lab 07/19/19 1148 07/19/19 1640 07/19/19 2104 07/20/19 0747 07/20/19 1206  GLUCAP 90 126* 129* 96 97   Lipid Profile: No results for input(s): CHOL, HDL, LDLCALC, TRIG, CHOLHDL, LDLDIRECT in the last 72 hours. Thyroid Function Tests: Recent Labs    07/19/19 1125  TSH 2.314   Anemia Panel: Recent Labs    07/19/19 1125  VITAMINB12 214  FOLATE 7.9  TIBC 252  IRON 222*  RETICCTPCT 1.7   Sepsis  Labs: Recent Labs  Lab 07/18/19 1239 07/18/19 1718 07/19/19 0044 07/20/19 0650  PROCALCITON <0.10  --  <0.10 <0.10  LATICACIDVEN  --  1.2  --   --     Recent Results (from the past 240 hour(s))  Culture, blood (routine x 2)     Status: Abnormal   Collection Time: 07/18/19  5:18 PM   Specimen: BLOOD  Result Value Ref Range Status   Specimen Description   Final    BLOOD BLOOD LEFT FOREARM Performed at [facility], [address], [place], [place] [address]    Special Requests   Final    BOTTLES DRAWN AEROBIC AND ANAEROBIC Blood Culture results may not be optimal due to an excessive volume of blood received in culture bottles Performed at [facility], [address], [place], [place] [address]    Culture  Setup Time   Final    GRAM POSITIVE COCCI AEROBIC BOTTLE ONLY CRITICAL RESULT CALLED TO, READ BACK BY AND VERIFIED WITH: Eleonore Chiquito AT 1252 07/19/2019 [place] Performed at [facility], [address], [place], [place] [address]  Culture (A)  Final    STAPHYLOCOCCUS SPECIES (COAGULASE NEGATIVE) THE SIGNIFICANCE OF ISOLATING THIS ORGANISM FROM A SINGLE SET OF BLOOD CULTURES WHEN MULTIPLE SETS ARE DRAWN IS UNCERTAIN. PLEASE NOTIFY THE MICROBIOLOGY DEPARTMENT WITHIN ONE WEEK IF SPECIATION AND SENSITIVITIES ARE REQUIRED. Performed at [facility], [place] [address], [place], [place] [address]    Report Status 07/20/2019 FINAL  Final  Culture, blood (routine x 2)     Status: None (Preliminary result)   Collection Time: 07/18/19  5:18 PM   Specimen: BLOOD  Result Value Ref Range Status   Specimen Description BLOOD BLOOD RIGHT FOREARM  Final   Special Requests   Final    BOTTLES DRAWN AEROBIC AND ANAEROBIC Blood Culture adequate volume   Culture   Final    NO GROWTH 2 DAYS Performed at [facility], [address] [place], [place] [address]    Report Status PENDING  Incomplete  Respiratory Panel by RT PCR (Flu A&B, Covid) -  Nasopharyngeal Swab     Status: None   Collection Time: 07/18/19  8:40 PM   Specimen: Nasopharyngeal Swab  Result Value Ref Range Status   SARS Coronavirus 2 by RT PCR NEGATIVE NEGATIVE Final    Comment: (NOTE) SARS-CoV-2 target nucleic acids are NOT DETECTED. The SARS-CoV-2 RNA is generally detectable in upper respiratoy specimens during the acute phase of infection. The lowest concentration of SARS-CoV-2 viral copies this assay can detect is 131 copies/mL. A negative result does not preclude SARS-Cov-2 infection and should not be used as the sole basis for treatment or other patient management decisions. A negative result may occur with  improper specimen collection/handling, submission of specimen other than nasopharyngeal swab, presence of viral mutation(s) within the areas targeted by this assay, and inadequate number of viral copies (<131 copies/mL). A negative result must be combined with clinical observations, patient history, and epidemiological information. The expected result is Negative. Fact Sheet for Patients:  PinkCheek.be Fact Sheet for Healthcare Providers:  GravelBags.it This test is not yet ap proved or cleared by the [place] FDA and  has been authorized for detection and/or diagnosis of SARS-CoV-2 by FDA under an Emergency Use Authorization (EUA). This EUA will remain  in effect (meaning this test can be used) for the duration of the COVID-19 declaration under Section 564(b)(1) of the Act, 21 U.S.C. section 360bbb-3(b)(1), unless the authorization is terminated or revoked sooner.    Influenza A by PCR NEGATIVE NEGATIVE Final   Influenza B by PCR NEGATIVE NEGATIVE Final    Comment: (NOTE) The Xpert Xpress SARS-CoV-2/FLU/RSV assay is intended as an aid in  the diagnosis of influenza from Nasopharyngeal swab specimens and  should not be used as a sole basis for treatment. Nasal washings and  aspirates  are unacceptable for Xpert Xpress SARS-CoV-2/FLU/RSV  testing. Fact Sheet for Patients: PinkCheek.be Fact Sheet for Healthcare Providers: GravelBags.it This test is not yet approved or cleared by the [place] FDA and  has been authorized for detection and/or diagnosis of SARS-CoV-2 by  FDA under an Emergency Use Authorization (EUA). This EUA will remain  in effect (meaning this test can be used) for the duration of the  Covid-19 declaration under Section 564(b)(1) of the Act, 21  U.S.C. section 360bbb-3(b)(1), unless the authorization is  terminated or revoked. Performed at [facility], [address] [place], [place] [address]   Culture, blood (single) w Reflex to ID Panel     Status: None (Preliminary result)  Collection Time: 07/19/19  1:39 PM   Specimen: BLOOD  Result Value Ref Range Status   Specimen Description BLOOD BLOOD RIGHT WRIST  Final   Special Requests   Final    BOTTLES DRAWN AEROBIC AND ANAEROBIC Blood Culture adequate volume   Culture   Final    NO GROWTH < 24 HOURS Performed at [facility], [address] [place], [place] [address]    Report Status PENDING  Incomplete         Radiology Studies: CT Head Wo Contrast  Result Date: 07/18/2019 CLINICAL DATA:  Fall EXAM: CT HEAD WITHOUT CONTRAST TECHNIQUE: Contiguous axial images were obtained from the base of the skull through the vertex without intravenous contrast. COMPARISON:  12/02/2018 FINDINGS: Brain: There is atrophy and chronic small vessel disease changes. No acute intracranial abnormality. Specifically, no hemorrhage, hydrocephalus, mass lesion, acute infarction, or significant intracranial injury. Vascular: No hyperdense vessel or unexpected calcification. Skull: No acute calvarial abnormality. Sinuses/Orbits: Visualized paranasal sinuses and mastoids clear. Orbital soft tissues unremarkable. Other: None IMPRESSION:  Atrophy, chronic microvascular disease. No acute intracranial abnormality. Electronically Signed   By: Rolm Baptise M.D.   On: 07/18/2019 19:29   CT Cervical Spine Wo Contrast  Result Date: 07/18/2019 CLINICAL DATA:  Fall EXAM: CT CERVICAL SPINE WITHOUT CONTRAST TECHNIQUE: Multidetector CT imaging of the cervical spine was performed without intravenous contrast. Multiplanar CT image reconstructions were also generated. COMPARISON:  09/27/2018 FINDINGS: Alignment: No subluxation Skull base and vertebrae: No acute fracture. No primary bone lesion or focal pathologic process. Soft tissues and spinal canal: No prevertebral fluid or swelling. No visible canal hematoma. Disc levels: Diffuse degenerative facet disease bilaterally. Moderate degenerative disc disease most pronounced at C5-6 and C6-7. Upper chest: Ground-glass nodular density in the anterior left upper lobe measures 7 mm and is stable since March of 2019 compatible with benign nodule. No acute findings. Other: None IMPRESSION: Degenerative disc and facet disease.  No acute bony abnormality. Electronically Signed   By: Rolm Baptise M.D.   On: 07/18/2019 19:34   DG Chest Portable 1 View  Result Date: 07/18/2019 CLINICAL DATA:  Hypothermia EXAM: PORTABLE CHEST 1 VIEW COMPARISON:  11/03/2017 FINDINGS: No focal airspace disease or effusion. Mild to moderate cardiomegaly without edema. Tortuous aorta with calcification. No pneumothorax. IMPRESSION: No active disease.  Cardiomegaly. Electronically Signed   By: Donavan Foil M.D.   On: 07/18/2019 18:11        Scheduled Meds: . sodium chloride   Intravenous Once  . amLODipine  5 mg Oral Daily  . calcium-vitamin D  1 tablet Oral Daily  . carvedilol  12.5 mg Oral BID WC  . clopidogrel  75 mg Oral Daily  . insulin aspart  0-9 Units Subcutaneous TID WC  . insulin detemir  5 Units Subcutaneous QHS  . loratadine  10 mg Oral Daily  . nitroGLYCERIN  0.1 mg Transdermal Daily  . pantoprazole  40 mg Oral  Daily  . pravastatin  20 mg Oral QHS  . QUEtiapine  50 mg Oral QHS  . ramipril  5 mg Oral BID  . sertraline  50 mg Oral Daily  . vitamin B-12  1,000 mcg Oral Daily   Continuous Infusions: . sodium chloride       LOS: 2 days    Time spent: 35 minutes    Ezekiel Slocumb, DO [facility]   If 7PM-7AM, please contact night-coverage [email or web address] 07/20/2019, 1:03 PM

## 2019-07-20 NOTE — TOC Initial Note (Signed)
Transition of Care [facility]) - Initial/Assessment Note    Patient Details  Name: [name] MRN: [id number] Date of Birth: 27-Oct-1931  Transition [facility] Care [facility]) CM/SW Contact:    Elease Hashimoto, LCSW Phone Number: 07/20/2019, 10:08 AM  Clinical Narrative: Met with niece-Sue Ellen-POA to discuss discharge plans. Niece does have 24/7 caregivers in place since pt's CVA last year. She also had just started [facility] for PT and SPT services. Pt was starting to have delirum episodes at home also could become combative, even with niece which niece reports is not her baseline or her personality. Niece is aware of option for SNF but really wants to get pt home, this is also where pt wants to be and continues to ask to be there. Along with seeing deceased people and people not in the room with her. Discussed possible palliative consult if MD agrees and bed and chair alarms for home, in case pt tries to get up to alert the caregivers. Will ask MD for palliative consult and work with niece on best discharge plan for pt.               Expected Discharge Plan: [place] Barriers to Discharge: Continued Medical Work up   Patient Goals and CMS Choice Patient states their goals for this hospitalization and ongoing recovery are:: Niece here pt unable to participate due to sleeping. Niece wants to get her home with care and her caregivers since this is where she wants to be      Expected Discharge Plan and Services Expected Discharge Plan: [place] In-house Referral: Clinical Social Work Discharge Planning Services: Other - See comment(Niece wants to ge ther home but be safe for all invoilved) Post Acute Care Choice: Home Health                                        Prior Living Arrangements/Services   Lives with:: Other (Comment)(24/7 caregivers)              Current home services: Home PT, Other (comment)(Private duty caregivers also  had [facility] just started)    Activities of Daily Living Home Assistive Devices/Equipment: Walker (specify type), CBG Meter ADL Screening (condition at time of admission) Patient's cognitive ability adequate to safely complete daily activities?: No Is the patient deaf or have difficulty hearing?: No Does the patient have difficulty seeing, even when wearing glasses/contacts?: No Does the patient have difficulty concentrating, remembering, or making decisions?: Yes Patient able to express need for assistance with ADLs?: Yes Does the patient have difficulty dressing or bathing?: Yes Independently performs ADLs?: No Does the patient have difficulty walking or climbing stairs?: Yes Weakness of Legs: Both Weakness of Arms/Hands: Both  Permission Sought/Granted                  Emotional Assessment Appearance:: Appears older than stated age Attitude/Demeanor/Rapport: Sedated Affect (typically observed): Other (comment)(sleeping from medication given last night) Orientation: : Oriented to Self      Admission diagnosis:  Confusion [R41.0] Pancytopenia ([place]) [D61.818] Fall, initial encounter [W19.XXXA] Patient Active Problem List   Diagnosis Date Noted  . B12 deficiency   . Pancytopenia ([place]) 07/18/2019  . Closed left hip fracture ([place]) 11/30/2018  . Liver lesion 04/22/2018  . Goals of care, counseling/discussion 04/22/2018  . Cognitive deficit, post-stroke 12/31/2017  .  Ataxia, post-stroke 12/31/2017  . Dysarthria, post-stroke   . Gait disturbance, post-stroke   . H/O cerebral parenchymal hemorrhage 11/04/2017  . Essential hypertension 11/04/2017  . Hyperlipidemia 11/04/2017  . Diabetes ([place]) 11/04/2017  . CAD (coronary artery disease) 11/04/2017  . 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 Hypokalemia 11/04/2017  . 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 History of cervical cancer   . History of TIA (transient ischemic attack)   .  Acute systolic congestive heart failure ([place])   . Reactive hypertension   . Hypernatremia   . Leukocytosis   . Acute blood loss anemia   . Elevated serum creatinine   . Acute respiratory failure with hypoxia ([place])   . IVH (intraventricular hemorrhage) ([place]) 10/29/2017  . Hypoxia 10/28/2017  . Pancreatic lesion 05/24/2017  . 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 Nodule of upper lobe of left lung 06/04/2015  . 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 Cerebral thrombosis with cerebral infarction 04/03/2015  . Cancer of right colon ([place]) 03/28/2015  . CVA (cerebral infarction) 12/21/2014  . Polycythemia vera ([place]) 06/22/2006   PCP:  Baxter Hire, MD Pharmacy:   [place], [place] - [address] [place] [address] Phone: [phone] Fax: [phone]     Social Determinants of Health (SDOH) Interventions    Readmission Risk Interventions Readmission Risk Prevention Plan 12/04/2018  Transportation Screening Complete  Medication Review ([place]) Complete  PCP or Specialist appointment within 3-5 days of discharge Complete  HRI or Home Care Consult Complete  SW Recovery Care/Counseling Consult Complete  Palliative Care Screening Complete  Skilled Nursing Facility Complete  Some recent data might be hidden

## 2019-07-20 NOTE — Discharge Summary (Signed)
Physician Discharge Summary  SIREN SHAMPINE [id number] DOB: [birth year] DOA: 07/18/2019  PCP: Baxter Hire, MD  Admit date: 07/18/2019 Discharge date: 07/20/2019  Admitted From: Home with caregivers Disposition:  Home with caregivers  Recommendations for Outpatient Follow-up:  1. Follow up with PCP in 1-2 weeks 2. Please obtain BMP/CBC in one week 3. Please follow up with Dr. Dwyane Luo, hematologist, on Monday as scheduled.  Home Health: Yes, resume  Equipment/Devices: None   Discharge Condition: Stable  CODE STATUS: DNR  Diet recommendation: Heart healthy / Carb modified  Brief/Interim Summary:  86 y.o.femalewith medical history significant ofpolycythemia veraon hydroxyurea, diabetes, endometrial carcinoma, degenerative disc disease, TIAs, previous CVAs who was brought in from homeafter a mechanicalfall, due to persistentgeneralized weaknessrecently.Labs showed leukopenia (WBC 1k) and anemia (Hbg 7.0 >> 6.8) with relatively preserved platelet count of 166k. Suspect these abnormalities could be secondary to hydroxyurea toxicity. Had not followed with hematology in quite some time due to the pandemic. Patient admitted to hospitalist service with oncology consulted. Two units packed RBC's transfused overnight 1/26-27.  Patient's hemoglobin improved and remained stable.  Leukopenia slightly improved as well.  Per request of patient's niece/POA, palliative care consult was obtained during admission to discuss services available for patient.  Patient is clinically improved and stable for discharge home with 24-hr caregivers and to resume home health PT set up prior to this admission.   Discharge Diagnoses: Principal Problem:   Pancytopenia ([place]) Active Problems:   Cancer of right colon ([place])   Polycythemia vera ([place])   History of cervical cancer   Essential hypertension   Hyperlipidemia   Diabetes ([place])   B12 deficiency    Discharge Instructions   Discharge Instructions     Call MD for:  extreme fatigue   Complete by: As directed    Call MD for:  severe uncontrolled pain   Complete by: As directed    Call MD for:  temperature >100.4   Complete by: As directed    Diet - low sodium heart healthy   Complete by: As directed    Increase activity slowly   Complete by: As directed      Allergies as of 07/20/2019      Reactions   Other Other (See Comments)   ALL OPIOIDS    Simvastatin Other (See Comments)   Myalgia      Medication List    STOP taking these medications   hydroxyurea 500 MG capsule Commonly known as: HYDREA     TAKE these medications   acetaminophen 325 MG tablet Commonly known as: TYLENOL Take 1-2 tablets (325-650 mg total) by mouth every 6 (six) hours as needed for mild pain (pain score 1-3 or temp > 100.5). What changed:   how much to take  when to take this   amLODipine 5 MG tablet Commonly known as: NORVASC Take 1 tablet (5 mg total) by mouth daily. Start taking on: July 21, 2019 What changed:   medication strength  how much to take   calcium-vitamin D 500-200 MG-UNIT tablet Commonly known as: OSCAL WITH D Take 1 tablet by mouth daily.   carvedilol 25 MG tablet Commonly known as: COREG Take 1 tablet (25 mg total) by mouth 2 (two) times daily with a meal. What changed: how much to take   clopidogrel 75 MG tablet Commonly known as: PLAVIX Take 1 tablet (75 mg total) by mouth daily.   cyanocobalamin 1000 MCG tablet Take 1 tablet (1,000 mcg total) by mouth daily. Start  taking on: July 21, 2019   fexofenadine 180 MG tablet Commonly known as: Allegra Allergy Take 1 tablet (180 mg total) by mouth daily.   insulin detemir 100 unit/ml Soln Commonly known as: LEVEMIR Inject 0.08 mLs (8 Units total) into the skin at bedtime. What changed: how much to take   nitroGLYCERIN 0.1 mg/hr patch Commonly known as: NITRODUR - Dosed in mg/24 hr Place 0.1 mg onto the skin daily.   pantoprazole 40 MG  tablet Commonly known as: PROTONIX Take 1 tablet (40 mg total) by mouth daily.   pravastatin 20 MG tablet Commonly known as: PRAVACHOL Take 1 tablet (20 mg total) by mouth at bedtime.   QUEtiapine 50 MG tablet Commonly known as: SEROQUEL Take 50 mg by mouth at bedtime.   ramipril 5 MG capsule Commonly known as: ALTACE Take 5 mg by mouth 2 (two) times daily.   sertraline 50 MG tablet Commonly known as: ZOLOFT Take 1 tablet (50 mg total) by mouth daily.   spironolactone 25 MG tablet Commonly known as: ALDACTONE Take 0.5 tablets (12.5 mg total) by mouth every other day for 30 days. What changed:   when to take this  additional instructions       Allergies  Allergen Reactions  . Other Other (See Comments)    ALL OPIOIDS   . Simvastatin Other (See Comments)    Myalgia    Consultations:  Hematology    Procedures/Studies: CT Head Wo Contrast  Result Date: 07/18/2019 CLINICAL DATA:  Fall EXAM: CT HEAD WITHOUT CONTRAST TECHNIQUE: Contiguous axial images were obtained from the base of the skull through the vertex without intravenous contrast. COMPARISON:  12/02/2018 FINDINGS: Brain: There is atrophy and chronic small vessel disease changes. No acute intracranial abnormality. Specifically, no hemorrhage, hydrocephalus, mass lesion, acute infarction, or significant intracranial injury. Vascular: No hyperdense vessel or unexpected calcification. Skull: No acute calvarial abnormality. Sinuses/Orbits: Visualized paranasal sinuses and mastoids clear. Orbital soft tissues unremarkable. Other: None IMPRESSION: Atrophy, chronic microvascular disease. No acute intracranial abnormality. Electronically Signed   By: Rolm Baptise M.D.   On: 07/18/2019 19:29   CT Cervical Spine Wo Contrast  Result Date: 07/18/2019 CLINICAL DATA:  Fall EXAM: CT CERVICAL SPINE WITHOUT CONTRAST TECHNIQUE: Multidetector CT imaging of the cervical spine was performed without intravenous contrast. Multiplanar CT  image reconstructions were also generated. COMPARISON:  09/27/2018 FINDINGS: Alignment: No subluxation Skull base and vertebrae: No acute fracture. No primary bone lesion or focal pathologic process. Soft tissues and spinal canal: No prevertebral fluid or swelling. No visible canal hematoma. Disc levels: Diffuse degenerative facet disease bilaterally. Moderate degenerative disc disease most pronounced at C5-6 and C6-7. Upper chest: Ground-glass nodular density in the anterior left upper lobe measures 7 mm and is stable since March of 2019 compatible with benign nodule. No acute findings. Other: None IMPRESSION: Degenerative disc and facet disease.  No acute bony abnormality. Electronically Signed   By: Rolm Baptise M.D.   On: 07/18/2019 19:34   DG Chest Portable 1 View  Result Date: 07/18/2019 CLINICAL DATA:  Hypothermia EXAM: PORTABLE CHEST 1 VIEW COMPARISON:  11/03/2017 FINDINGS: No focal airspace disease or effusion. Mild to moderate cardiomegaly without edema. Tortuous aorta with calcification. No pneumothorax. IMPRESSION: No active disease.  Cardiomegaly. Electronically Signed   By: Donavan Foil M.D.   On: 07/18/2019 18:11      Transfusion of packed red blood cells, 2 units    Subjective: Patient sleeping comfortably this AM.  Niece at bedside stayed overnight.  Patient was very agitated overnight, required haldol.  Confused when she is awake this AM, but mostly has been sleeping.  When awake, asks to go home.  Appear in no distress during encounter.  Discussed palliative consult with niece, agreeable.     Discharge Exam: Vitals:   07/20/19 0748 07/20/19 1040  BP: (!) 189/74 (!) 178/95  Pulse: (!) 57 71  Resp: 16   Temp: (!) 97.5 F (36.4 C)   SpO2: 96%    Vitals:   07/19/19 1545 07/20/19 0551 07/20/19 0748 07/20/19 1040  BP: (!) 154/93 (!) 193/86 (!) 189/74 (!) 178/95  Pulse: 61 75 (!) 57 71  Resp:  14 16   Temp:  97.6 F (36.4 C) (!) 97.5 F (36.4 C)   TempSrc: Oral Oral  Axillary   SpO2: 97% 99% 96%   Weight:      Height:        General: Pt is alert, awake, not in acute distress Cardiovascular: RRR, S1/S2 +, no rubs, no gallops Respiratory: CTA bilaterally, no wheezing, no rhonchi Abdominal: Soft, NT, ND, bowel sounds + Extremities: no edema, no cyanosis    The results of significant diagnostics from this hospitalization (including imaging, microbiology, ancillary and laboratory) are listed below for reference.     Microbiology: Recent Results (from the past 240 hour(s))  Culture, blood (routine x 2)     Status: Abnormal   Collection Time: 07/18/19  5:18 PM   Specimen: BLOOD  Result Value Ref Range Status   Specimen Description   Final    BLOOD BLOOD LEFT FOREARM Performed at [facility], [address] [place], [place] [address]    Special Requests   Final    BOTTLES DRAWN AEROBIC AND ANAEROBIC Blood Culture results may not be optimal due to an excessive volume of blood received in culture bottles Performed at [facility], [address] [place], [place] [address]    Culture  Setup Time   Final    GRAM POSITIVE COCCI AEROBIC BOTTLE ONLY CRITICAL RESULT CALLED TO, READ BACK BY AND VERIFIED WITH: Winfield Rast PATEL AT 1252 07/19/2019 SDR Performed at [facility], [address] [place], [place] [address]    Culture (A)  Final    STAPHYLOCOCCUS SPECIES (COAGULASE NEGATIVE) THE SIGNIFICANCE OF ISOLATING THIS ORGANISM FROM A SINGLE SET OF BLOOD CULTURES WHEN MULTIPLE SETS ARE DRAWN IS UNCERTAIN. PLEASE NOTIFY THE MICROBIOLOGY DEPARTMENT WITHIN ONE WEEK IF SPECIATION AND SENSITIVITIES ARE REQUIRED. Performed at [facility], [place] [address] [place], [place] [address]    Report Status 07/20/2019 FINAL  Final  Culture, blood (routine x 2)     Status: None (Preliminary result)   Collection Time: 07/18/19  5:18 PM   Specimen: BLOOD  Result Value Ref Range Status   Specimen Description BLOOD BLOOD RIGHT FOREARM   Final   Special Requests   Final    BOTTLES DRAWN AEROBIC AND ANAEROBIC Blood Culture adequate volume   Culture   Final    NO GROWTH 2 DAYS Performed at [facility], [address], [place], [place] [address]    Report Status PENDING  Incomplete  Respiratory Panel by RT PCR (Flu A&B, Covid) - Nasopharyngeal Swab     Status: None   Collection Time: 07/18/19  8:40 PM   Specimen: Nasopharyngeal Swab  Result Value Ref Range Status   SARS Coronavirus 2 by RT PCR NEGATIVE NEGATIVE Final    Comment: (NOTE) SARS-CoV-2 target nucleic acids are  NOT DETECTED. The SARS-CoV-2 RNA is generally detectable in upper respiratoy specimens during the acute phase of infection. The lowest concentration of SARS-CoV-2 viral copies this assay can detect is 131 copies/mL. A negative result does not preclude SARS-Cov-2 infection and should not be used as the sole basis for treatment or other patient management decisions. A negative result may occur with  improper specimen collection/handling, submission of specimen other than nasopharyngeal swab, presence of viral mutation(s) within the areas targeted by this assay, and inadequate number of viral copies (<131 copies/mL). A negative result must be combined with clinical observations, patient history, and epidemiological information. The expected result is Negative. Fact Sheet for Patients:  PinkCheek.be Fact Sheet for Healthcare Providers:  GravelBags.it This test is not yet ap proved or cleared by the [place] FDA and  has been authorized for detection and/or diagnosis of SARS-CoV-2 by FDA under an Emergency Use Authorization (EUA). This EUA will remain  in effect (meaning this test can be used) for the duration of the COVID-19 declaration under Section 564(b)(1) of the Act, 21 U.S.C. section 360bbb-3(b)(1), unless the authorization is terminated or revoked sooner.    Influenza A  by PCR NEGATIVE NEGATIVE Final   Influenza B by PCR NEGATIVE NEGATIVE Final    Comment: (NOTE) The Xpert Xpress SARS-CoV-2/FLU/RSV assay is intended as an aid in  the diagnosis of influenza from Nasopharyngeal swab specimens and  should not be used as a sole basis for treatment. Nasal washings and  aspirates are unacceptable for Xpert Xpress SARS-CoV-2/FLU/RSV  testing. Fact Sheet for Patients: PinkCheek.be Fact Sheet for Healthcare Providers: GravelBags.it This test is not yet approved or cleared by the [place] FDA and  has been authorized for detection and/or diagnosis of SARS-CoV-2 by  FDA under an Emergency Use Authorization (EUA). This EUA will remain  in effect (meaning this test can be used) for the duration of the  Covid-19 declaration under Section 564(b)(1) of the Act, 21  U.S.C. section 360bbb-3(b)(1), unless the authorization is  terminated or revoked. Performed at [facility], [address] [place], [place] [address]   Culture, blood (single) w Reflex to ID Panel     Status: None (Preliminary result)   Collection Time: 07/19/19  1:39 PM   Specimen: BLOOD  Result Value Ref Range Status   Specimen Description BLOOD BLOOD RIGHT WRIST  Final   Special Requests   Final    BOTTLES DRAWN AEROBIC AND ANAEROBIC Blood Culture adequate volume   Culture   Final    NO GROWTH < 24 HOURS Performed at [facility], [address] [place], [place] [address]    Report Status PENDING  Incomplete     Labs: BNP (last 3 results) No results for input(s): BNP in the last 8760 hours. Basic Metabolic Panel: Recent Labs  Lab 07/18/19 1239 07/19/19 0044 07/20/19 0650  NA 135 137 140  K 4.2 4.0 3.9  CL 104 105 105  CO2 25 28 26   GLUCOSE 160* 162* 93  BUN 37* 35* 29*  CREATININE 1.19* 1.09* 0.96  CALCIUM 8.8* 8.5* 8.9  MG  --   --  1.8   Liver Function Tests: Recent Labs  Lab 07/19/19 0044   AST 15  ALT 20  ALKPHOS 32*  BILITOT 0.7  PROT 5.3*  ALBUMIN 3.4*   No results for input(s): LIPASE, AMYLASE in the last 168 hours. No results for input(s): AMMONIA in the last 168 hours. CBC: Recent Labs  Lab 07/18/19  1239 07/19/19 0044 07/19/19 1125 07/20/19 0650  WBC 1.1*  1.1* 1.2* 1.4* 1.6*  NEUTROABS 0.6*  --   --  0.7*  HGB 7.0*  7.1* 6.8* 9.9* 10.4*  HCT 20.7*  21.5* 19.7* 28.0* 29.3*  MCV 128.6*  127.2* 126.3* 107.3* 109.3*  PLT 162  160 150 179 170   Cardiac Enzymes: No results for input(s): CKTOTAL, CKMB, CKMBINDEX, TROPONINI in the last 168 hours. BNP: Invalid input(s): POCBNP CBG: Recent Labs  Lab 07/19/19 1148 07/19/19 1640 07/19/19 2104 07/20/19 0747 07/20/19 1206  GLUCAP 90 126* 129* 96 97   D-Dimer No results for input(s): DDIMER in the last 72 hours. Hgb A1c Recent Labs    07/18/19 2154  HGBA1C 5.3   Lipid Profile No results for input(s): CHOL, HDL, LDLCALC, TRIG, CHOLHDL, LDLDIRECT in the last 72 hours. Thyroid function studies Recent Labs    07/19/19 1125  TSH 2.314   Anemia work up Recent Labs    07/19/19 1125  VITAMINB12 214  FOLATE 7.9  TIBC 252  IRON 222*  RETICCTPCT 1.7   Urinalysis    Component Value Date/Time   COLORURINE YELLOW (A) 07/18/2019 1239   APPEARANCEUR CLOUDY (A) 07/18/2019 1239   APPEARANCEUR Clear 11/10/2012 1347   LABSPEC 1.019 07/18/2019 1239   LABSPEC 1.012 11/10/2012 1347   PHURINE 5.0 07/18/2019 1239   GLUCOSEU NEGATIVE 07/18/2019 1239   GLUCOSEU Negative 11/10/2012 1347   HGBUR NEGATIVE 07/18/2019 1239   BILIRUBINUR NEGATIVE 07/18/2019 1239   BILIRUBINUR Negative 11/10/2012 [place] 07/18/2019 1239   PROTEINUR 30 (A) 07/18/2019 1239   NITRITE NEGATIVE 07/18/2019 1239   LEUKOCYTESUR TRACE (A) 07/18/2019 1239   LEUKOCYTESUR 1+ 11/10/2012 1347   Sepsis Labs Invalid input(s): PROCALCITONIN,  WBC,  LACTICIDVEN Microbiology Recent Results (from the past 240 hour(s))   Culture, blood (routine x 2)     Status: Abnormal   Collection Time: 07/18/19  5:18 PM   Specimen: BLOOD  Result Value Ref Range Status   Specimen Description   Final    BLOOD BLOOD LEFT FOREARM Performed at [facility], [address] [place], [place] [address]    Special Requests   Final    BOTTLES DRAWN AEROBIC AND ANAEROBIC Blood Culture results may not be optimal due to an excessive volume of blood received in culture bottles Performed at [facility], [address] [place], [place] [address]    Culture  Setup Time   Final    GRAM POSITIVE COCCI AEROBIC BOTTLE ONLY CRITICAL RESULT CALLED TO, READ BACK BY AND VERIFIED WITH: Winfield Rast PATEL AT 1252 07/19/2019 SDR Performed at [facility], [address] [place], [place] [address]    Culture (A)  Final    STAPHYLOCOCCUS SPECIES (COAGULASE NEGATIVE) THE SIGNIFICANCE OF ISOLATING THIS ORGANISM FROM A SINGLE SET OF BLOOD CULTURES WHEN MULTIPLE SETS ARE DRAWN IS UNCERTAIN. PLEASE NOTIFY THE MICROBIOLOGY DEPARTMENT WITHIN ONE WEEK IF SPECIATION AND SENSITIVITIES ARE REQUIRED. Performed at [facility], [place] [address] [place],  [address]    Report Status 07/20/2019 FINAL  Final  Culture, blood (routine x 2)     Status: None (Preliminary result)   Collection Time: 07/18/19  5:18 PM   Specimen: BLOOD  Result Value Ref Range Status   Specimen Description BLOOD BLOOD RIGHT FOREARM  Final   Special Requests   Final    BOTTLES DRAWN AEROBIC AND ANAEROBIC Blood Culture adequate volume   Culture   Final  NO GROWTH 2 DAYS Performed at [facility], [address] [place], [place] [address]    Report Status PENDING  Incomplete  Respiratory Panel by RT PCR (Flu A&B, Covid) - Nasopharyngeal Swab     Status: None   Collection Time: 07/18/19  8:40 PM   Specimen: Nasopharyngeal Swab  Result Value Ref Range Status   SARS Coronavirus 2 by RT PCR NEGATIVE NEGATIVE Final    Comment:  (NOTE) SARS-CoV-2 target nucleic acids are NOT DETECTED. The SARS-CoV-2 RNA is generally detectable in upper respiratoy specimens during the acute phase of infection. The lowest concentration of SARS-CoV-2 viral copies this assay can detect is 131 copies/mL. A negative result does not preclude SARS-Cov-2 infection and should not be used as the sole basis for treatment or other patient management decisions. A negative result may occur with  improper specimen collection/handling, submission of specimen other than nasopharyngeal swab, presence of viral mutation(s) within the areas targeted by this assay, and inadequate number of viral copies (<131 copies/mL). A negative result must be combined with clinical observations, patient history, and epidemiological information. The expected result is Negative. Fact Sheet for Patients:  PinkCheek.be Fact Sheet for Healthcare Providers:  GravelBags.it This test is not yet ap proved or cleared by the [place] FDA and  has been authorized for detection and/or diagnosis of SARS-CoV-2 by FDA under an Emergency Use Authorization (EUA). This EUA will remain  in effect (meaning this test can be used) for the duration of the COVID-19 declaration under Section 564(b)(1) of the Act, 21 U.S.C. section 360bbb-3(b)(1), unless the authorization is terminated or revoked sooner.    Influenza A by PCR NEGATIVE NEGATIVE Final   Influenza B by PCR NEGATIVE NEGATIVE Final    Comment: (NOTE) The Xpert Xpress SARS-CoV-2/FLU/RSV assay is intended as an aid in  the diagnosis of influenza from Nasopharyngeal swab specimens and  should not be used as a sole basis for treatment. Nasal washings and  aspirates are unacceptable for Xpert Xpress SARS-CoV-2/FLU/RSV  testing. Fact Sheet for Patients: PinkCheek.be Fact Sheet for Healthcare  Providers: GravelBags.it This test is not yet approved or cleared by the [place] FDA and  has been authorized for detection and/or diagnosis of SARS-CoV-2 by  FDA under an Emergency Use Authorization (EUA). This EUA will remain  in effect (meaning this test can be used) for the duration of the  Covid-19 declaration under Section 564(b)(1) of the Act, 21  U.S.C. section 360bbb-3(b)(1), unless the authorization is  terminated or revoked. Performed at [facility], [address] [place], [place] [address]   Culture, blood (single) w Reflex to ID Panel     Status: None (Preliminary result)   Collection Time: 07/19/19  1:39 PM   Specimen: BLOOD  Result Value Ref Range Status   Specimen Description BLOOD BLOOD RIGHT WRIST  Final   Special Requests   Final    BOTTLES DRAWN AEROBIC AND ANAEROBIC Blood Culture adequate volume   Culture   Final    NO GROWTH < 24 HOURS Performed at [facility], [address], [place], [place] [address]    Report Status PENDING  Incomplete     Time coordinating discharge: Over 30 minutes  SIGNED:   Ezekiel Slocumb, DO [facility] 07/20/2019, 3:41 PM   If 7PM-7AM, please contact night-coverage [email or web address]

## 2019-07-20 NOTE — Consult Note (Signed)
[place]  [phone] [phone]   Name: [name] Date: 07/20/2019 MRN: [id number]  DOB: 1931/07/07  Patient Care Team: Baxter Hire, MD as PCP - General (Internal Medicine) Yolonda Kida, MD as Consulting Physician (Cardiology)    REASON FOR CONSULTATION: [name] is a 86 y.o. female with multiple medical problems including polycythemia vera on hydroxyurea, diabetes, TIA with previous CVA, and history of small bowel carcinoid status post resection who was lost to follow-up from medical oncology.  Patient was hospitalized 11/30/2018-12/04/2018 with a left femur fracture status post surgical repair.  She was hospitalized again 07/18/2019-07/20/2019 with progressive weakness after falling at home.  She was found to have leukopenia and anemia thought secondary to hydroxyurea toxicity.  Palliative care was consulted to help address goals.  SOCIAL HISTORY:     reports that she has quit smoking. She has never used smokeless tobacco. She reports that she does not drink alcohol or use drugs.   Patient is widowed.  She has no children.  She lives at home with 24/7 caregivers.  She has a niece who is involved in her care.  ADVANCE DIRECTIVES:  Not on file  CODE STATUS: DNR  PAST MEDICAL HISTORY: Past Medical History:  Diagnosis Date  . Adenocarcinoma in situ of cervix   . Arthritis    hands  . Breast cancer ([place])   . Colon cancer ([place])   . Diabetes mellitus without complication ([place])   . Endometrial carcinoma ([facility])    s/p total abdominal hysterectomy  . H/O compression fracture of spine 2014   thoracic spine  . H/O polycythemia vera   . H/O TIA (transient ischemic attack) and stroke 09/2014, 03/2015   No deficits  . Hypertension   . Hyperuricemia   . Microalbuminuria   . Myocardial infarction ([place])   . Polycythemia vera ([place])   . Recurrent falls   . Skin cancer    face, legs  . Stroke [facility])  2008   no deficits  . Trochanteric bursitis   . Varicose veins    treated    PAST SURGICAL HISTORY:  Past Surgical History:  Procedure Laterality Date  . ABDOMINAL HYSTERECTOMY    . BOWEL RESECTION N/A 03/28/2015   Procedure: SMALL BOWEL RESECTION;  Surgeon: Leonie Green, MD;  Location: [facility];  Service: General;  Laterality: N/A;  . CATARACT EXTRACTION W/ INTRAOCULAR LENS IMPLANT Right   . CATARACT EXTRACTION W/PHACO Left 10/07/2015   Procedure: CATARACT EXTRACTION PHACO AND INTRAOCULAR LENS PLACEMENT (IOC);  Surgeon: Ronnell Freshwater, MD;  Location: [facility];  Service: Ophthalmology;  Laterality: Left;  DIABETIC - oral meds VISION BLUE  . CORONARY ANGIOGRAPHY N/A 10/29/2017   Procedure: CORONARY ANGIOGRAPHY;  Surgeon: Dionisio David, MD;  Location: [place] [facility];  Service: Cardiovascular;  Laterality: N/A;  . EXPLORATORY LAPAROTOMY     for fibroids  . HIP ARTHROPLASTY Left 12/01/2018   Procedure: ARTHROPLASTY BIPOLAR HIP (HEMIARTHROPLASTY);  Surgeon: Corky Mull, MD;  Location: [facility];  Service: Orthopedics;  Laterality: Left;  . LEFT HEART CATH Right 10/29/2017   Procedure: Left Heart Cath;  Surgeon: Dionisio David, MD;  Location: [place] [facility];  Service: Cardiovascular;  Laterality: Right;  . TONSILLECTOMY      HEMATOLOGY/ONCOLOGY HISTORY:  Oncology History   No history exists.    ALLERGIES:  is allergic to other and simvastatin.  MEDICATIONS:  Current Facility-Administered Medications  Medication Dose Route Frequency Provider Last Rate Last Admin  . 0.45 % sodium chloride infusion   Intravenous Continuous Garba, Mohammad L, MD      . 0.9 %  sodium chloride infusion (Manually program via Guardrails IV Fluids)   Intravenous Once Elwyn Reach, MD      . acetaminophen (TYLENOL) tablet 650 mg  650 mg Oral Q6H PRN Nicole Kindred A, DO   650 mg at 07/19/19 1001  . amLODipine (NORVASC) tablet 5 mg  5 mg Oral Daily Nicole Kindred  A, DO   5 mg at 07/20/19 1245  . calcium-vitamin D (OSCAL WITH D) 500-200 MG-UNIT per tablet 1 tablet  1 tablet Oral Daily Lang Snow, NP   1 tablet at 07/20/19 1244  . carvedilol (COREG) tablet 12.5 mg  12.5 mg Oral BID WC Lang Snow, NP   12.5 mg at 07/19/19 1646  . clopidogrel (PLAVIX) tablet 75 mg  75 mg Oral Daily Lang Snow, NP   75 mg at 07/20/19 1245  . hydrALAZINE (APRESOLINE) tablet 25 mg  25 mg Oral Q6H PRN Nicole Kindred A, DO      . insulin aspart (novoLOG) injection 0-9 Units  0-9 Units Subcutaneous TID WC Elwyn Reach, MD   1 Units at 07/19/19 1646  . insulin detemir (LEVEMIR) injection 5 Units  5 Units Subcutaneous QHS Nicole Kindred A, DO   5 Units at 07/19/19 2102  . loratadine (CLARITIN) tablet 10 mg  10 mg Oral Daily Lang Snow, NP   10 mg at 07/20/19 1246  . nitroGLYCERIN (NITRODUR - Dosed in mg/24 hr) patch 0.1 mg  0.1 mg Transdermal Daily Lang Snow, NP   0.1 mg at 07/20/19 1246  . ondansetron (ZOFRAN) tablet 4 mg  4 mg Oral Q6H PRN Elwyn Reach, MD       Or  . ondansetron (ZOFRAN) injection 4 mg  4 mg Intravenous Q6H PRN Elwyn Reach, MD      . pantoprazole (PROTONIX) EC tablet 40 mg  40 mg Oral Daily Lang Snow, NP   40 mg at 07/20/19 1246  . pravastatin (PRAVACHOL) tablet 20 mg  20 mg Oral QHS Lang Snow, NP   20 mg at 07/19/19 2101  . QUEtiapine (SEROQUEL) tablet 50 mg  50 mg Oral QHS Lang Snow, NP   50 mg at 07/19/19 2101  . ramipril (ALTACE) capsule 5 mg  5 mg Oral BID Nicole Kindred A, DO   Stopped at 07/20/19 1032  . sertraline (ZOLOFT) tablet 50 mg  50 mg Oral Daily Nicole Kindred A, DO   50 mg at 07/20/19 1245  . vitamin B-12 (CYANOCOBALAMIN) tablet 1,000 mcg  1,000 mcg Oral Daily Nolon Stalls C, MD   1,000 mcg at 07/20/19 1245    VITAL SIGNS: BP (!) 178/95 (BP Location: Left Arm)   Pulse 71   Temp (!) 97.5 F (36.4 C) (Axillary)    Resp 16   Ht 5' 3"  (1.6 m)   Wt 120 lb (54.4 kg)   SpO2 96%   BMI 21.26 kg/m  Filed Weights   07/18/19 1234  Weight: 120 lb (54.4 kg)    Estimated body mass index is 21.26 kg/m as calculated from the following:   Height as of this encounter: 5' 3"  (1.6 m).   Weight as of this encounter: 120 lb (54.4 kg).  LABS: CBC:    Component Value Date/Time   WBC 1.6 (L)  07/20/2019 0650   HGB 10.4 (L) 07/20/2019 0650   HGB 12.2 10/15/2014 1250   HCT 29.3 (L) 07/20/2019 0650   HCT 39.4 10/15/2014 1250   PLT 170 07/20/2019 0650   PLT 476 (H) 10/15/2014 1250   MCV 109.3 (H) 07/20/2019 0650   MCV 76 (L) 10/15/2014 1250   NEUTROABS 0.7 (L) 07/20/2019 0650   NEUTROABS 7.8 (H) 10/15/2014 1250   LYMPHSABS 0.7 07/20/2019 0650   LYMPHSABS 1.5 10/15/2014 1250   MONOABS 0.2 07/20/2019 0650   MONOABS 0.7 10/15/2014 1250   EOSABS 0.0 07/20/2019 0650   EOSABS 0.4 10/15/2014 1250   BASOSABS 0.0 07/20/2019 0650   BASOSABS 0.3 (H) 10/15/2014 1250   Comprehensive Metabolic Panel:    Component Value Date/Time   NA 140 07/20/2019 0650   NA 138 02/14/2014 1409   K 3.9 07/20/2019 0650   K 4.3 01/17/2014 1514   CL 105 07/20/2019 0650   CL 99 01/17/2014 1514   CO2 26 07/20/2019 0650   CO2 27 01/17/2014 1514   BUN 29 (H) 07/20/2019 0650   BUN 25 (H) 01/17/2014 1514   CREATININE 0.96 07/20/2019 0650   CREATININE 1.26 01/17/2014 1514   GLUCOSE 93 07/20/2019 0650   GLUCOSE 164 (H) 01/17/2014 1514   CALCIUM 8.9 07/20/2019 0650   CALCIUM 9.5 01/17/2014 1514   AST 15 07/19/2019 0044   AST 19 11/10/2012 1221   ALT 20 07/19/2019 0044   ALT 17 11/10/2012 1221   ALKPHOS 32 (L) 07/19/2019 0044   ALKPHOS 54 11/10/2012 1221   BILITOT 0.7 07/19/2019 0044   BILITOT 0.4 11/10/2012 1221   PROT 5.3 (L) 07/19/2019 0044   PROT 6.8 11/10/2012 1221   ALBUMIN 3.4 (L) 07/19/2019 0044   ALBUMIN 3.5 11/10/2012 1221    RADIOGRAPHIC STUDIES: CT Head Wo Contrast  Result Date: 07/18/2019 CLINICAL DATA:  Fall  EXAM: CT HEAD WITHOUT CONTRAST TECHNIQUE: Contiguous axial images were obtained from the base of the skull through the vertex without intravenous contrast. COMPARISON:  12/02/2018 FINDINGS: Brain: There is atrophy and chronic small vessel disease changes. No acute intracranial abnormality. Specifically, no hemorrhage, hydrocephalus, mass lesion, acute infarction, or significant intracranial injury. Vascular: No hyperdense vessel or unexpected calcification. Skull: No acute calvarial abnormality. Sinuses/Orbits: Visualized paranasal sinuses and mastoids clear. Orbital soft tissues unremarkable. Other: None IMPRESSION: Atrophy, chronic microvascular disease. No acute intracranial abnormality. Electronically Signed   By: Rolm Baptise M.D.   On: 07/18/2019 19:29   CT Cervical Spine Wo Contrast  Result Date: 07/18/2019 CLINICAL DATA:  Fall EXAM: CT CERVICAL SPINE WITHOUT CONTRAST TECHNIQUE: Multidetector CT imaging of the cervical spine was performed without intravenous contrast. Multiplanar CT image reconstructions were also generated. COMPARISON:  09/27/2018 FINDINGS: Alignment: No subluxation Skull base and vertebrae: No acute fracture. No primary bone lesion or focal pathologic process. Soft tissues and spinal canal: No prevertebral fluid or swelling. No visible canal hematoma. Disc levels: Diffuse degenerative facet disease bilaterally. Moderate degenerative disc disease most pronounced at C5-6 and C6-7. Upper chest: Ground-glass nodular density in the anterior left upper lobe measures 7 mm and is stable since March of 2019 compatible with benign nodule. No acute findings. Other: None IMPRESSION: Degenerative disc and facet disease.  No acute bony abnormality. Electronically Signed   By: Rolm Baptise M.D.   On: 07/18/2019 19:34   DG Chest Portable 1 View  Result Date: 07/18/2019 CLINICAL DATA:  Hypothermia EXAM: PORTABLE CHEST 1 VIEW COMPARISON:  11/03/2017 FINDINGS: No focal airspace disease or  effusion.  Mild to moderate cardiomegaly without edema. Tortuous aorta with calcification. No pneumothorax. IMPRESSION: No active disease.  Cardiomegaly. Electronically Signed   By: Donavan Foil M.D.   On: 07/18/2019 18:11    PERFORMANCE STATUS (ECOG) : 2 - Symptomatic, <50% confined to bed  Review of Systems Deferred  Physical Exam General: NAD, frail appearing, thin Pulmonary: Unlabored, periods of sleep apnea Extremities: no edema, no joint deformities Skin: no rashes Neurological: Sleeping  IMPRESSION: Patient is currently sleeping.  Patient apparently required dose of Haldol last evening due to agitation.  I met with patient's niece, who is at bedside.  Together, we discussed patient's current medical problems.  She says at baseline, patient lives at home and has 24/7 caregivers.  She is dependent upon her caregivers for assistance with activities of daily living like bathing and dressing.  It sounds like her performance status has been mostly stable.  Patient has home health currently.  Niece is interested in home-based palliative care.  We also discussed the future option of hospice in the event of decline.  Patient had rising levels of chromogranin and was previously scheduled for a 68-Ga DOTATATE PET scan but was apparently lost to follow up. Family now intend to establish with hematology/med onc at the [facility]. I am also happy to follow her in the clinic.   PLAN: -Home with home health/home PC -DNR/DNI -I am happy to follow patient in the clinic following discharge from hospital   Time Total: 30 minutes  Visit consisted of counseling and education dealing with the complex and emotionally intense issues of symptom management and palliative care in the setting of serious and potentially life-threatening illness.Greater than 50%  of this time was spent counseling and coordinating care related to the above assessment and plan.  Signed by: Altha Harm, PhD,  NP-C

## 2019-07-21 ENCOUNTER — Telehealth: Payer: Self-pay | Admitting: Adult Health Nurse Practitioner

## 2019-07-21 LAB — CULTURE, BLOOD (ROUTINE X 2)

## 2019-07-21 NOTE — Telephone Encounter (Signed)
Spoke with patient's Niece Ree Edman regarding Palliative services and she was in agreement with this.  I have scheduled a Telephone Consult for 07/27/19 @ 1 PM.

## 2019-07-23 LAB — CULTURE, BLOOD (ROUTINE X 2)
Culture: NO GROWTH
Special Requests: ADEQUATE

## 2019-07-24 ENCOUNTER — Inpatient Hospital Stay (HOSPITAL_BASED_OUTPATIENT_CLINIC_OR_DEPARTMENT_OTHER): Payer: Medicare PPO | Admitting: Oncology

## 2019-07-24 ENCOUNTER — Encounter: Payer: Self-pay | Admitting: Oncology

## 2019-07-24 ENCOUNTER — Other Ambulatory Visit: Payer: Self-pay

## 2019-07-24 ENCOUNTER — Other Ambulatory Visit: Payer: Self-pay | Admitting: *Deleted

## 2019-07-24 ENCOUNTER — Inpatient Hospital Stay: Payer: Medicare PPO | Attending: Oncology

## 2019-07-24 VITALS — BP 126/71 | HR 53 | Temp 96.6°F | Resp 18 | Wt 95.5 lb

## 2019-07-24 DIAGNOSIS — K7689 Other specified diseases of liver: Secondary | ICD-10-CM | POA: Diagnosis not present

## 2019-07-24 DIAGNOSIS — D45 Polycythemia vera: Secondary | ICD-10-CM

## 2019-07-24 DIAGNOSIS — I252 Old myocardial infarction: Secondary | ICD-10-CM | POA: Insufficient documentation

## 2019-07-24 DIAGNOSIS — Z85060 Personal history of malignant carcinoid tumor of small intestine: Secondary | ICD-10-CM | POA: Insufficient documentation

## 2019-07-24 DIAGNOSIS — Z803 Family history of malignant neoplasm of breast: Secondary | ICD-10-CM | POA: Insufficient documentation

## 2019-07-24 DIAGNOSIS — E538 Deficiency of other specified B group vitamins: Secondary | ICD-10-CM | POA: Diagnosis not present

## 2019-07-24 DIAGNOSIS — Z90710 Acquired absence of both cervix and uterus: Secondary | ICD-10-CM | POA: Insufficient documentation

## 2019-07-24 DIAGNOSIS — Z794 Long term (current) use of insulin: Secondary | ICD-10-CM | POA: Insufficient documentation

## 2019-07-24 DIAGNOSIS — Z833 Family history of diabetes mellitus: Secondary | ICD-10-CM | POA: Insufficient documentation

## 2019-07-24 DIAGNOSIS — Z853 Personal history of malignant neoplasm of breast: Secondary | ICD-10-CM | POA: Insufficient documentation

## 2019-07-24 DIAGNOSIS — Z8542 Personal history of malignant neoplasm of other parts of uterus: Secondary | ICD-10-CM | POA: Diagnosis not present

## 2019-07-24 DIAGNOSIS — Z806 Family history of leukemia: Secondary | ICD-10-CM | POA: Insufficient documentation

## 2019-07-24 DIAGNOSIS — D3A029 Benign carcinoid tumor of the large intestine, unspecified portion: Secondary | ICD-10-CM | POA: Diagnosis not present

## 2019-07-24 DIAGNOSIS — Z8673 Personal history of transient ischemic attack (TIA), and cerebral infarction without residual deficits: Secondary | ICD-10-CM | POA: Insufficient documentation

## 2019-07-24 DIAGNOSIS — Z79899 Other long term (current) drug therapy: Secondary | ICD-10-CM | POA: Diagnosis not present

## 2019-07-24 DIAGNOSIS — Z8249 Family history of ischemic heart disease and other diseases of the circulatory system: Secondary | ICD-10-CM | POA: Diagnosis not present

## 2019-07-24 DIAGNOSIS — Z87891 Personal history of nicotine dependence: Secondary | ICD-10-CM | POA: Diagnosis not present

## 2019-07-24 DIAGNOSIS — I119 Hypertensive heart disease without heart failure: Secondary | ICD-10-CM | POA: Insufficient documentation

## 2019-07-24 DIAGNOSIS — D61818 Other pancytopenia: Secondary | ICD-10-CM | POA: Insufficient documentation

## 2019-07-24 DIAGNOSIS — K769 Liver disease, unspecified: Secondary | ICD-10-CM

## 2019-07-24 LAB — CBC WITH DIFFERENTIAL/PLATELET
Abs Immature Granulocytes: 0.01 10*3/uL (ref 0.00–0.07)
Basophils Absolute: 0 10*3/uL (ref 0.0–0.1)
Basophils Relative: 1 %
Eosinophils Absolute: 0 10*3/uL (ref 0.0–0.5)
Eosinophils Relative: 1 %
HCT: 32.5 % — ABNORMAL LOW (ref 36.0–46.0)
Hemoglobin: 10.5 g/dL — ABNORMAL LOW (ref 12.0–15.0)
Immature Granulocytes: 1 %
Lymphocytes Relative: 31 %
Lymphs Abs: 0.7 10*3/uL (ref 0.7–4.0)
MCH: 37.8 pg — ABNORMAL HIGH (ref 26.0–34.0)
MCHC: 32.3 g/dL (ref 30.0–36.0)
MCV: 116.9 fL — ABNORMAL HIGH (ref 80.0–100.0)
Monocytes Absolute: 0.2 10*3/uL (ref 0.1–1.0)
Monocytes Relative: 10 %
Neutro Abs: 1.2 10*3/uL — ABNORMAL LOW (ref 1.7–7.7)
Neutrophils Relative %: 56 %
Platelets: 230 10*3/uL (ref 150–400)
RBC: 2.78 MIL/uL — ABNORMAL LOW (ref 3.87–5.11)
RDW: 24.3 % — ABNORMAL HIGH (ref 11.5–15.5)
WBC: 2.1 10*3/uL — ABNORMAL LOW (ref 4.0–10.5)
nRBC: 0 % (ref 0.0–0.2)

## 2019-07-24 LAB — CULTURE, BLOOD (SINGLE)
Culture: NO GROWTH
Special Requests: ADEQUATE

## 2019-07-24 NOTE — Progress Notes (Signed)
She is weak in knees and sometimes does not get the walker when she should and falls. Has bruises to her knees that are healing.

## 2019-07-25 ENCOUNTER — Encounter: Payer: Self-pay | Admitting: Oncology

## 2019-07-25 NOTE — Progress Notes (Signed)
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  Her last visit with her as an outpatient was in May 2020.  She was taking hydroxyurea 500 mg 5 times a week.  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  Tumor was 2.5 cm with 9+ lymph nodes pathologic stage was T3N1.  Her chromogranin levels since then have been monitored regularly and they have been chronically elevated between 200-2 50.  Patient was noted to have liver lesions but did not have any significant metabolic uptake on PET CT scan in November 2019.  At her last visit with Dr. Mike Gip in May 2020 plan was to get dotatate PET scan followed by consideration for liver biopsy and at that time patient was lost to follow-up   More recently patient was admitted to the hospital last week for generalized weakness and following a fall and was noted to have significant pancytopenia her hemoglobin had dropped down to 6.8 requiring a blood transfusion.  Typically her platelet counts which are between 3 50-400 were down to 179.  Also she was noted to have significant neutropenia with an ANC of 0.6 and a white cell count of 1.1.  Hydroxyurea was kept on hold and patient had extensive work-up including TSH B12 folate.  B12 levels  were low at 214.  Reticulocyte count was low at 1.7% indicative of a hypoproliferative anemia.  Chromogranin A levels were elevated at 440.    Interval history-she has a 24-hour caregiver.  Her niece is mainly her healthcare power of attorney and I spoke with her over the phone today.  Patient does report chronic fatigue and has been having some on and off diarrhea.  ECOG PS- 2 Pain scale- 0 Opioid associated constipation- no  Review of systems- Review of Systems  Constitutional: Positive for malaise/fatigue. Negative for chills, fever and weight loss.  HENT: Negative for congestion, ear discharge and nosebleeds.   Eyes: Negative for blurred vision.  Respiratory: Negative for cough, hemoptysis, sputum production, shortness of breath and wheezing.   Cardiovascular: Negative for chest pain, palpitations, orthopnea and claudication.  Gastrointestinal: Positive for diarrhea. Negative for abdominal pain, blood in stool, constipation, heartburn, melena, nausea and vomiting.  Genitourinary: Negative for dysuria, flank pain, frequency, hematuria and urgency.  Musculoskeletal: Negative for back pain, joint pain and myalgias.  Skin: Negative for rash.  Neurological: Negative for dizziness, tingling, focal weakness, seizures, weakness and headaches.  Endo/Heme/Allergies: Does not bruise/bleed easily.  Psychiatric/Behavioral: Negative for depression and suicidal ideas. The patient does not have insomnia.      Allergies  Allergen Reactions  . Other Other (See Comments)    ALL OPIOIDS   . Simvastatin Other (See Comments)    Myalgia     Past Medical History:  Diagnosis Date  . Adenocarcinoma in situ of cervix   .  Arthritis    hands  . Breast cancer ([place])   . Colon cancer ([place])   . Diabetes mellitus without complication ([place])   . Endometrial carcinoma ([facility])    s/p total abdominal hysterectomy  . H/O compression fracture of spine 2014   thoracic spine  . H/O polycythemia vera   . H/O TIA  (transient ischemic attack) and stroke 09/2014, 03/2015   No deficits  . Hypertension   . Hyperuricemia   . Microalbuminuria   . Myocardial infarction ([place])   . Polycythemia vera ([place])   . Recurrent falls   . Skin cancer    face, legs  . Stroke [facility]) 2008   no deficits  . Trochanteric bursitis   . Varicose veins    treated     Past Surgical History:  Procedure Laterality Date  . ABDOMINAL HYSTERECTOMY    . BOWEL RESECTION N/A 03/28/2015   Procedure: SMALL BOWEL RESECTION;  Surgeon: Leonie Green, MD;  Location: [facility] ORS;  Service: General;  Laterality: N/A;  . CATARACT EXTRACTION W/ INTRAOCULAR LENS IMPLANT Right   . CATARACT EXTRACTION W/PHACO Left 10/07/2015   Procedure: CATARACT EXTRACTION PHACO AND INTRAOCULAR LENS PLACEMENT (IOC);  Surgeon: Ronnell Freshwater, MD;  Location: [place];  Service: Ophthalmology;  Laterality: Left;  DIABETIC - oral meds VISION BLUE  . CORONARY ANGIOGRAPHY N/A 10/29/2017   Procedure: CORONARY ANGIOGRAPHY;  Surgeon: Dionisio David, MD;  Location: [place] [facility];  Service: Cardiovascular;  Laterality: N/A;  . EXPLORATORY LAPAROTOMY     for fibroids  . HIP ARTHROPLASTY Left 12/01/2018   Procedure: ARTHROPLASTY BIPOLAR HIP (HEMIARTHROPLASTY);  Surgeon: Corky Mull, MD;  Location: [facility] ORS;  Service: Orthopedics;  Laterality: Left;  . LEFT HEART CATH Right 10/29/2017   Procedure: Left Heart Cath;  Surgeon: Dionisio David, MD;  Location: [place] [facility];  Service: Cardiovascular;  Laterality: Right;  . TONSILLECTOMY      Social History   Socioeconomic History  . Marital status: Widowed    Spouse name: Not on file  . Number of children: Not on file  . Years of education: Not on file  . Highest education level: Not on file  Occupational History  . Not on file  Tobacco Use  . Smoking status: Former Research scientist (life sciences)  . Smokeless tobacco: Never Used  Substance and Sexual Activity  . Alcohol use: No  . Drug use: No  .  Sexual activity: Not on file  Other Topics Concern  . Not on file  Social History Narrative  . Not on file   Social Determinants of Health   Financial Resource Strain:   . Difficulty of Paying Living Expenses: Not on file  Food Insecurity:   . Worried About Charity fundraiser in the Last Year: Not on file  . Ran Out of Food in the Last Year: Not on file  Transportation Needs:   . Lack of Transportation (Medical): Not on file  . Lack of Transportation (Non-Medical): Not on file  Physical Activity:   . Days of Exercise per Week: Not on file  . Minutes of Exercise per Session: Not on file  Stress:   . Feeling of Stress : Not on file  Social Connections:   . Frequency of Communication with Friends and Family: Not on file  . Frequency of Social Gatherings with Friends and Family: Not on file  . Attends Religious Services: Not on file  . Active Member of Clubs or Organizations: Not  on file  . Attends Archivist Meetings: Not on file  . Marital Status: Not on file  Intimate Partner Violence:   . Fear of Current or Ex-Partner: Not on file  . Emotionally Abused: Not on file  . Physically Abused: Not on file  . Sexually Abused: Not on file    Family History  Problem Relation Age of Onset  . Cancer Brother        AML  . Heart attack Mother   . Breast cancer Mother   . Heart attack Father   . Diabetes Father   . Heart attack Sister   . Diabetes Sister      Current Outpatient Medications:  .  acetaminophen (TYLENOL) 325 MG tablet, Take 1-2 tablets (325-650 mg total) by mouth every 6 (six) hours as needed for mild pain (pain score 1-3 or temp > 100.5). (Patient taking differently: Take 650 mg by mouth 3 (three) times daily. ), Disp:  , Rfl:  .  amLODipine (NORVASC) 5 MG tablet, Take 1 tablet (5 mg total) by mouth daily., Disp: 30 tablet, Rfl: 1 .  calcium-vitamin D (OSCAL WITH D) 500-200 MG-UNIT per tablet, Take 1 tablet by mouth daily. , Disp: , Rfl:  .  carvedilol  (COREG) 25 MG tablet, Take 1 tablet (25 mg total) by mouth 2 (two) times daily with a meal. (Patient taking differently: Take 12.5 mg by mouth 2 (two) times daily with a meal. ), Disp: 60 tablet, Rfl: 0 .  clopidogrel (PLAVIX) 75 MG tablet, Take 1 tablet (75 mg total) by mouth daily., Disp: 90 tablet, Rfl: 0 .  fexofenadine (ALLEGRA ALLERGY) 180 MG tablet, Take 1 tablet (180 mg total) by mouth daily., Disp: , Rfl:  .  insulin detemir (LEVEMIR) 100 unit/ml SOLN, Inject 0.08 mLs (8 Units total) into the skin at bedtime. (Patient taking differently: Inject 10 Units into the skin at bedtime. ), Disp: 8 mL, Rfl: 1 .  nitroGLYCERIN (NITRODUR - DOSED IN MG/24 HR) 0.1 mg/hr patch, Place 0.1 mg onto the skin daily. , Disp: , Rfl:  .  pantoprazole (PROTONIX) 40 MG tablet, Take 1 tablet (40 mg total) by mouth daily., Disp: 30 tablet, Rfl: 0 .  pravastatin (PRAVACHOL) 20 MG tablet, Take 1 tablet (20 mg total) by mouth at bedtime., Disp: 30 tablet, Rfl: 0 .  QUEtiapine (SEROQUEL) 50 MG tablet, Take 50 mg by mouth at bedtime. , Disp: , Rfl:  .  ramipril (ALTACE) 5 MG capsule, Take 5 mg by mouth 2 (two) times daily., Disp: , Rfl:  .  sertraline (ZOLOFT) 50 MG tablet, Take 1 tablet (50 mg total) by mouth daily., Disp: 30 tablet, Rfl: 4 .  spironolactone (ALDACTONE) 25 MG tablet, Take 0.5 tablets (12.5 mg total) by mouth every other day for 30 days. (Patient taking differently: Take 12.5 mg by mouth See admin instructions. Take 12.5 mg by mouth twice daily every other day), Disp: 7.5 tablet, Rfl: 0 .  vitamin B-12 1000 MCG tablet, Take 1 tablet (1,000 mcg total) by mouth daily., Disp: 30 tablet, Rfl: 1  Physical exam:  Vitals:   07/24/19 1412  BP: 126/71  Pulse: (!) 53  Resp: 18  Temp: (!) 96.6 F (35.9 C)  TempSrc: Tympanic  Weight: 95 lb 8 oz (43.3 kg)   Physical Exam Constitutional:      Comments: Thin frail elderly lady sitting in a wheelchair.  Appears in no acute distress  HENT:     Head:  Normocephalic  and atraumatic.  Eyes:     Pupils: Pupils are equal, round, and reactive to light.  Cardiovascular:     Rate and Rhythm: Normal rate and regular rhythm.     Heart sounds: Normal heart sounds.  Pulmonary:     Effort: Pulmonary effort is normal.     Breath sounds: Normal breath sounds.  Abdominal:     General: Bowel sounds are normal.     Palpations: Abdomen is soft.  Musculoskeletal:     Cervical back: Normal range of motion.  Skin:    General: Skin is warm and dry.  Neurological:     Mental Status: She is alert and oriented to person, place, and time.      CMP Latest Ref Rng & Units 07/20/2019  Glucose 70 - 99 mg/dL 93  BUN 8 - 23 mg/dL 29(H)  Creatinine 0.44 - 1.00 mg/dL 0.96  Sodium 135 - 145 mmol/L 140  Potassium 3.5 - 5.1 mmol/L 3.9  Chloride 98 - 111 mmol/L 105  CO2 22 - 32 mmol/L 26  Calcium 8.9 - 10.3 mg/dL 8.9  Total Protein 6.5 - 8.1 g/dL -  Total Bilirubin 0.3 - 1.2 mg/dL -  Alkaline Phos 38 - 126 U/L -  AST 15 - 41 U/L -  ALT 0 - 44 U/L -   CBC Latest Ref Rng & Units 07/24/2019  WBC 4.0 - 10.5 K/uL 2.1(L)  Hemoglobin 12.0 - 15.0 g/dL 10.5(L)  Hematocrit 36.0 - 46.0 % 32.5(L)  Platelets 150 - 400 K/uL 230    No images are attached to the encounter.  CT Head Wo Contrast  Result Date: 07/18/2019 CLINICAL DATA:  Fall EXAM: CT HEAD WITHOUT CONTRAST TECHNIQUE: Contiguous axial images were obtained from the base of the skull through the vertex without intravenous contrast. COMPARISON:  12/02/2018 FINDINGS: Brain: There is atrophy and chronic small vessel disease changes. No acute intracranial abnormality. Specifically, no hemorrhage, hydrocephalus, mass lesion, acute infarction, or significant intracranial injury. Vascular: No hyperdense vessel or unexpected calcification. Skull: No acute calvarial abnormality. Sinuses/Orbits: Visualized paranasal sinuses and mastoids clear. Orbital soft tissues unremarkable. Other: None IMPRESSION: Atrophy, chronic  microvascular disease. No acute intracranial abnormality. Electronically Signed   By: Rolm Baptise M.D.   On: 07/18/2019 19:29   CT Cervical Spine Wo Contrast  Result Date: 07/18/2019 CLINICAL DATA:  Fall EXAM: CT CERVICAL SPINE WITHOUT CONTRAST TECHNIQUE: Multidetector CT imaging of the cervical spine was performed without intravenous contrast. Multiplanar CT image reconstructions were also generated. COMPARISON:  09/27/2018 FINDINGS: Alignment: No subluxation Skull base and vertebrae: No acute fracture. No primary bone lesion or focal pathologic process. Soft tissues and spinal canal: No prevertebral fluid or swelling. No visible canal hematoma. Disc levels: Diffuse degenerative facet disease bilaterally. Moderate degenerative disc disease most pronounced at C5-6 and C6-7. Upper chest: Ground-glass nodular density in the anterior left upper lobe measures 7 mm and is stable since March of 2019 compatible with benign nodule. No acute findings. Other: None IMPRESSION: Degenerative disc and facet disease.  No acute bony abnormality. Electronically Signed   By: Rolm Baptise M.D.   On: 07/18/2019 19:34   DG Chest Portable 1 View  Result Date: 07/18/2019 CLINICAL DATA:  Hypothermia EXAM: PORTABLE CHEST 1 VIEW COMPARISON:  11/03/2017 FINDINGS: No focal airspace disease or effusion. Mild to moderate cardiomegaly without edema. Tortuous aorta with calcification. No pneumothorax. IMPRESSION: No active disease.  Cardiomegaly. Electronically Signed   By: Donavan Foil M.D.   On: 07/18/2019 18:11  Assessment and plan- Patient is a 86 y.o. female with following issues::  1.  Pancytopenia: Possibly secondary to Hydrea. White cell count has improved to 2.1 with an ANC of 1.2 today as compared to 0.6 last week.  Hemoglobin remains at 10.5 and platelet count has improved from 1 7230.  I will hold off on doing a bone marrow biopsy at this time and I will repeat her CBC with differential in 1 month.  2.  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  Patient has on and off diarrhea and also chromogranin A levels were elevated at 440 as compared to her baseline of 200-2 50.  Patient was supposed to get her dotatate scan last year which was not done and we will schedule that at this time  3.  B12 deficiency: Patient will continue 1000 mcg of B12 daily  4.  P vera: Hydrea on hold until counts improve.  I will consider restarting it down the line after pancytopenia improves  I will see her back in 1 month's time after the dotatate scan and repeat CBC with differential for a video visit   Visit Diagnosis 1. Polycythemia vera ([place])   2. Carcinoid tumor of colon   3. Liver lesion      Dr. Randa Evens, MD, MPH [facility] [phone] 07/25/2019 2:21 PM

## 2019-07-27 ENCOUNTER — Other Ambulatory Visit: Payer: Self-pay

## 2019-07-27 ENCOUNTER — Other Ambulatory Visit: Payer: Medicare PPO | Admitting: Adult Health Nurse Practitioner

## 2019-07-27 ENCOUNTER — Telehealth: Payer: Self-pay

## 2019-07-27 DIAGNOSIS — Z515 Encounter for palliative care: Secondary | ICD-10-CM

## 2019-07-27 DIAGNOSIS — D45 Polycythemia vera: Secondary | ICD-10-CM

## 2019-07-27 NOTE — Telephone Encounter (Signed)
At the direction of Amy NP, message sent to Billey Chang NP with update on Palliative care visit including recommendations noted from Speech Therapist for patient to begin glycopyrrolate for increased secretions that are negatively impacting patient's speech.

## 2019-07-27 NOTE — Progress Notes (Signed)
[place] Consult Note Telephone: [phone]  Fax: [phone]  PATIENT NAME: [name] DOB: January 07, 1932 MRN: [id number]  PRIMARY CARE PROVIDER:   Baxter Hire, MD  REFERRING PROVIDER:  Billey Chang NP  RESPONSIBLE PARTY:   Ree Edman, niece [phone] Patient's home phone [phone]  Due to the COVID-19 crisis, this visit was done via telemedicine and it was initiated and consent by this patient and or family. Video-audio (telehealth) contact was unable to be done due to technical barriers from the patients side.    RECOMMENDATIONS and PLAN:  1.  Advanced care planning.  Patient listed as DNR/DNI in Epic.  Did not go over today but will discuss further at next visit in 2 weeks.  2.  Polycythemia.  Patient had recent hospitalizaton 1/26-1/28/2021 after having fall related to weakness. Believed to have hydroxyurea toxicity.  Hydroxyurea was stopped.  Her hemoglobin was low at 6.8 requiring blood transfusions. She is being followed by Dr. Janese Banks, hematology/oncology.  Her blood counts still off at follow up on 2/1.  Continuing to hold hydroxyurea until blood counts improve.  Patient does have intermittent diarrhea that has been ongoing for a couple of years.  Caregiver states that she will have the diarrhea a couple days in a row when she does have it.  Has not tried anything for it. Have suggested Immodium. Continue follow up and recommendations by Dr. Janese Banks.  3.  Functional status.  Patient is weaker after her hospitalization but is still able to use her walker to ambulate short distances. Uses a wheelchair for longer distances.  Is able to assist with ADLs.  Is continent of B&B.  Is receiving PT/OT through [facility].  Continue working with [facility] PT/OT as ordered.  4.  Nutritional status.  Patient has been stable around 96-100 pounds over past 4 months or more.  She had MI and CVA in 2019 in which she lost weight from  140 to 120.  She had lost more weight after a fall with left femur fracture in June 2020 requiring surgical intervention.  Caregiver states that she eats well.  She is working with ST through [facility] and is noted to have increased saliva production that is believe to be giving her problems with her speech.  Niece and caregiver have noted hearing "fluid in her throat."  She has had excessive tearing and nasal drainage for years and uses fexofenadine.  Have suggested adding flonase or nasacort to help.  Have reached out to Dr. Tillman Sers office with recommendations for glycopyrolate  PRN for the excessive saliva production.    5.  Hallucinations.  Patient has hallucinations believed to be due to past CVA. Patient does not do well when out of her environment and while in the hospital was having hallucinations that people were out to harm her.  Niece states that she can have the hallucinations at home and is currently on seroquel 50 mg at bedtime.  Niece reports that this is helpful but the hallucinations have been worse since being in the hospital.  States that Dr. Edwina Barth did not want to increase the seroquel in case this is just a response to being out of her environment.  Will continue to monitor to see if the hallucinations will lessen now that she is back home.  Have phone appointment in 2 weeks to reassess.  6.  Pain.  Patient has pain in both knees related to arthritis.  Has been evaluated at  pain clinic and she has bone on bone to bilateral knees.  It was recommended that she have knee replacement surgery. Niece concerned with this option as taking her out of her environment causes increased confusion. Tramadol causes increased confusion and hallucinations.  Would avoid opioids altogether.  She does get relief with Voltaren gel and Tylenol.  Patches not as effective with decreased adipose tissue for absorption.  Have suggested could also use capsaicin cream.  Did go over that it is most effective used  daily and warned of initial burning to the skin.    Palliative will continue to monitor for symptom management/decline and make recommendations as needed.  I spent 60 minutes providing this consultation, including time with patient/family, chart review, provider coordination, and documentation . More than 50% of the time in this consultation was spent coordinating communication.   HISTORY OF PRESENT ILLNESS:  [name] is a 86 y.o. year old female with multiple medical problems including polycythemia, DMT2, DDD, h/o TIAs, h/o endometrial carcinoma. Palliative Care was asked to help address goals of care.   CODE STATUS: DNR/DNI  PPS: 50% HOSPICE ELIGIBILITY/DIAGNOSIS: TBD  PHYSICAL EXAM:   Deferred  PAST MEDICAL HISTORY:  Past Medical History:  Diagnosis Date   Adenocarcinoma in situ of cervix    Arthritis    hands   Breast cancer ([place])    Colon cancer ([place])    Diabetes mellitus without complication ([place])    Endometrial carcinoma ([place])    s/p total abdominal hysterectomy   H/O compression fracture of spine 2014   thoracic spine   H/O polycythemia vera    H/O TIA (transient ischemic attack) and stroke 09/2014, 03/2015   No deficits   Hypertension    Hyperuricemia    Microalbuminuria    Myocardial infarction ([place])    Polycythemia vera ([place])    Recurrent falls    Skin cancer    face, legs   Stroke ([place]) 2008   no deficits   Trochanteric bursitis    Varicose veins    treated    SOCIAL HX:  Social History   Tobacco Use   Smoking status: Former Smoker   Smokeless tobacco: Never Used  Substance Use Topics   Alcohol use: No    ALLERGIES:  Allergies  Allergen Reactions   Other Other (See Comments)    ALL OPIOIDS    Simvastatin Other (See Comments)    Myalgia     PERTINENT MEDICATIONS:  Outpatient Encounter Medications as of 07/27/2019  Medication Sig   acetaminophen (TYLENOL) 325 MG tablet Take 1-2 tablets (325-650 mg total) by mouth  every 6 (six) hours as needed for mild pain (pain score 1-3 or temp > 100.5). (Patient taking differently: Take 650 mg by mouth 3 (three) times daily. )   amLODipine (NORVASC) 5 MG tablet Take 1 tablet (5 mg total) by mouth daily.   calcium-vitamin D (OSCAL WITH D) 500-200 MG-UNIT per tablet Take 1 tablet by mouth daily.    carvedilol (COREG) 25 MG tablet Take 1 tablet (25 mg total) by mouth 2 (two) times daily with a meal. (Patient taking differently: Take 12.5 mg by mouth 2 (two) times daily with a meal. )   clopidogrel (PLAVIX) 75 MG tablet Take 1 tablet (75 mg total) by mouth daily.   fexofenadine (ALLEGRA ALLERGY) 180 MG tablet Take 1 tablet (180 mg total) by mouth daily.   insulin detemir (LEVEMIR) 100 unit/ml SOLN Inject 0.08 mLs (8 Units total) into the skin at  bedtime. (Patient taking differently: Inject 10 Units into the skin at bedtime. )   nitroGLYCERIN (NITRODUR - DOSED IN MG/24 HR) 0.1 mg/hr patch Place 0.1 mg onto the skin daily.    pantoprazole (PROTONIX) 40 MG tablet Take 1 tablet (40 mg total) by mouth daily.   pravastatin (PRAVACHOL) 20 MG tablet Take 1 tablet (20 mg total) by mouth at bedtime.   QUEtiapine (SEROQUEL) 50 MG tablet Take 50 mg by mouth at bedtime.    ramipril (ALTACE) 5 MG capsule Take 5 mg by mouth 2 (two) times daily.   sertraline (ZOLOFT) 50 MG tablet Take 1 tablet (50 mg total) by mouth daily.   spironolactone (ALDACTONE) 25 MG tablet Take 0.5 tablets (12.5 mg total) by mouth every other day for 30 days. (Patient taking differently: Take 12.5 mg by mouth See admin instructions. Take 12.5 mg by mouth twice daily every other day)   vitamin B-12 1000 MCG tablet Take 1 tablet (1,000 mcg total) by mouth daily.   No facility-administered encounter medications on file as of 07/27/2019.       Mavryk Pino Jenetta Downer, NP

## 2019-08-03 ENCOUNTER — Encounter
Admission: RE | Admit: 2019-08-03 | Discharge: 2019-08-03 | Disposition: A | Discharge status: Home / Self Care | Payer: Medicare PPO | Source: Ambulatory Visit | Attending: Oncology | Admitting: Oncology

## 2019-08-03 ENCOUNTER — Other Ambulatory Visit: Payer: Self-pay

## 2019-08-03 DIAGNOSIS — D45 Polycythemia vera: Secondary | ICD-10-CM | POA: Insufficient documentation

## 2019-08-03 DIAGNOSIS — K769 Liver disease, unspecified: Secondary | ICD-10-CM

## 2019-08-03 DIAGNOSIS — D3A029 Benign carcinoid tumor of the large intestine, unspecified portion: Secondary | ICD-10-CM | POA: Diagnosis present

## 2019-08-03 MED ORDER — GALLIUM GA 68 DOTATATE IV KIT
2.3300 | PACK | Freq: Once | INTRAVENOUS | Status: AC | PRN
  Administered 2019-08-03: 2.502 via INTRAVENOUS

## 2019-08-10 ENCOUNTER — Other Ambulatory Visit: Payer: Self-pay

## 2019-08-10 ENCOUNTER — Ambulatory Visit (HOSPITAL_COMMUNITY): Payer: Medicare PPO

## 2019-08-10 ENCOUNTER — Other Ambulatory Visit: Payer: Medicare PPO | Admitting: Adult Health Nurse Practitioner

## 2019-08-21 ENCOUNTER — Other Ambulatory Visit: Payer: Self-pay

## 2019-08-21 ENCOUNTER — Inpatient Hospital Stay: Payer: Medicare PPO | Attending: Oncology

## 2019-08-21 DIAGNOSIS — C7A012 Malignant carcinoid tumor of the ileum: Secondary | ICD-10-CM | POA: Diagnosis not present

## 2019-08-21 DIAGNOSIS — E119 Type 2 diabetes mellitus without complications: Secondary | ICD-10-CM | POA: Insufficient documentation

## 2019-08-21 DIAGNOSIS — Z87891 Personal history of nicotine dependence: Secondary | ICD-10-CM | POA: Insufficient documentation

## 2019-08-21 DIAGNOSIS — Z85828 Personal history of other malignant neoplasm of skin: Secondary | ICD-10-CM | POA: Diagnosis not present

## 2019-08-21 DIAGNOSIS — I252 Old myocardial infarction: Secondary | ICD-10-CM | POA: Diagnosis not present

## 2019-08-21 DIAGNOSIS — Z8673 Personal history of transient ischemic attack (TIA), and cerebral infarction without residual deficits: Secondary | ICD-10-CM | POA: Insufficient documentation

## 2019-08-21 DIAGNOSIS — I10 Essential (primary) hypertension: Secondary | ICD-10-CM | POA: Insufficient documentation

## 2019-08-21 DIAGNOSIS — Z794 Long term (current) use of insulin: Secondary | ICD-10-CM | POA: Diagnosis not present

## 2019-08-21 DIAGNOSIS — C7B02 Secondary carcinoid tumors of liver: Secondary | ICD-10-CM | POA: Insufficient documentation

## 2019-08-21 DIAGNOSIS — R197 Diarrhea, unspecified: Secondary | ICD-10-CM | POA: Insufficient documentation

## 2019-08-21 DIAGNOSIS — Z79899 Other long term (current) drug therapy: Secondary | ICD-10-CM | POA: Diagnosis not present

## 2019-08-21 DIAGNOSIS — D45 Polycythemia vera: Secondary | ICD-10-CM | POA: Diagnosis not present

## 2019-08-21 DIAGNOSIS — D61818 Other pancytopenia: Secondary | ICD-10-CM | POA: Insufficient documentation

## 2019-08-21 LAB — CBC WITH DIFFERENTIAL/PLATELET
Abs Immature Granulocytes: 0.11 10*3/uL — ABNORMAL HIGH (ref 0.00–0.07)
Basophils Absolute: 0.1 10*3/uL (ref 0.0–0.1)
Basophils Relative: 1 %
Eosinophils Absolute: 0.1 10*3/uL (ref 0.0–0.5)
Eosinophils Relative: 2 %
HCT: 37.7 % (ref 36.0–46.0)
Hemoglobin: 12 g/dL (ref 12.0–15.0)
Immature Granulocytes: 1 %
Lymphocytes Relative: 11 %
Lymphs Abs: 1 10*3/uL (ref 0.7–4.0)
MCH: 36.5 pg — ABNORMAL HIGH (ref 26.0–34.0)
MCHC: 31.8 g/dL (ref 30.0–36.0)
MCV: 114.6 fL — ABNORMAL HIGH (ref 80.0–100.0)
Monocytes Absolute: 0.6 10*3/uL (ref 0.1–1.0)
Monocytes Relative: 7 %
Neutro Abs: 7 10*3/uL (ref 1.7–7.7)
Neutrophils Relative %: 78 %
Platelets: 684 10*3/uL — ABNORMAL HIGH (ref 150–400)
RBC: 3.29 MIL/uL — ABNORMAL LOW (ref 3.87–5.11)
RDW: 18.2 % — ABNORMAL HIGH (ref 11.5–15.5)
WBC: 8.9 10*3/uL (ref 4.0–10.5)
nRBC: 0 % (ref 0.0–0.2)

## 2019-08-22 ENCOUNTER — Inpatient Hospital Stay (HOSPITAL_BASED_OUTPATIENT_CLINIC_OR_DEPARTMENT_OTHER): Payer: Medicare PPO | Admitting: Oncology

## 2019-08-22 ENCOUNTER — Encounter: Payer: Self-pay | Admitting: Oncology

## 2019-08-22 DIAGNOSIS — Z7189 Other specified counseling: Secondary | ICD-10-CM | POA: Diagnosis not present

## 2019-08-22 DIAGNOSIS — C7B8 Other secondary neuroendocrine tumors: Secondary | ICD-10-CM

## 2019-08-22 DIAGNOSIS — D45 Polycythemia vera: Secondary | ICD-10-CM | POA: Diagnosis not present

## 2019-08-22 DIAGNOSIS — D61818 Other pancytopenia: Secondary | ICD-10-CM

## 2019-08-22 MED ORDER — HYDROXYUREA 500 MG PO CAPS: 500.0000 mg | ORAL_CAPSULE | Freq: Every day | ORAL | 5 refills | Status: DC

## 2019-08-22 NOTE — Progress Notes (Signed)
Patient stated that she had been doing well with no complaints. 

## 2019-08-23 NOTE — Progress Notes (Signed)
I connected with [name] on 08/23/19 at  1:00 PM EST by video enabled telemedicine visit and verified that I am speaking with the correct person using two identifiers.   I discussed the limitations, risks, security and privacy concerns of performing an evaluation and management service by telemedicine and the availability of in-person appointments. I also discussed with the patient that there may be a patient responsible charge related to this service. The patient expressed understanding and agreed to proceed.  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 Mike Gip for polycythemia vera and she was on Hydrea for the same.  Her last visit with her as an outpatient was in May 2020.  She was taking hydroxyurea 500 mg 5 times a week.  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  Tumor was 2.5 cm with 9+ lymph nodes pathologic stage was T3N1.  Her chromogranin levels since then have been monitored regularly and they have been chronically elevated between 200-2 50.  Patient was noted to have liver lesions but did not have any significant metabolic uptake on PET CT scan in November 2019.  At her last visit with Dr. Mike Gip in May 2020 plan was to get dotatate PET scan followed by consideration for liver biopsy and at that time patient was lost to follow-up   More recently patient was admitted to the hospital last week for generalized weakness and following a fall and was noted to have significant pancytopenia her hemoglobin had  dropped down to 6.8 requiring a blood transfusion.  Typically her platelet counts which are between 3 50-400 were down to 179.  Also she was noted to have significant neutropenia with an ANC of 0.6 and a white cell count of 1.1.  Hydroxyurea was kept on hold and patient had extensive work-up including TSH B12 folate.  B12 levels were low at 214.  Reticulocyte count was low at 1.7% indicative of a hypoproliferative anemia.  Chromogranin A levels were elevated at 440.     Interval history: No changes since last visit.  Still reports ongoing fatigue and on and off diarrhea   Review of Systems  Constitutional: Positive for malaise/fatigue. Negative for chills, fever and weight loss.  HENT: Negative for congestion, ear discharge and nosebleeds.   Eyes: Negative for blurred vision.  Respiratory: Negative for cough, hemoptysis, sputum production, shortness of breath and wheezing.   Cardiovascular: Negative for chest pain, palpitations, orthopnea and claudication.  Gastrointestinal: Positive for diarrhea. Negative for abdominal pain, blood in stool, constipation, heartburn, melena, nausea and vomiting.  Genitourinary: Negative for dysuria, flank pain, frequency, hematuria and urgency.  Musculoskeletal: Negative for back pain, joint pain and myalgias.  Skin: Negative for rash.  Neurological: Negative for dizziness, tingling, focal weakness, seizures, weakness and headaches.  Endo/Heme/Allergies: Does not bruise/bleed easily.  Psychiatric/Behavioral: Negative for depression and suicidal ideas. The patient does not have insomnia.     Allergies  Allergen Reactions  . Other Other (See Comments)    ALL OPIOIDS   . Simvastatin Other (See Comments)  Myalgia    Past Medical History:  Diagnosis Date  . Adenocarcinoma in situ of cervix   . Arthritis    hands  . Breast cancer ([place])   . Colon cancer ([place])   . Diabetes mellitus without complication ([place])   . Endometrial carcinoma ([facility])    s/p  total abdominal hysterectomy  . H/O compression fracture of spine 2014   thoracic spine  . H/O polycythemia vera   . H/O TIA (transient ischemic attack) and stroke 09/2014, 03/2015   No deficits  . Hypertension   . Hyperuricemia   . Microalbuminuria   . Myocardial infarction ([place])   . Polycythemia vera ([place])   . Recurrent falls   . Skin cancer    face, legs  . Stroke [facility]) 2008   no deficits  . Trochanteric bursitis   . Varicose veins    treated    Past Surgical History:  Procedure Laterality Date  . ABDOMINAL HYSTERECTOMY    . BOWEL RESECTION N/A 03/28/2015   Procedure: SMALL BOWEL RESECTION;  Surgeon: Leonie Green, MD;  Location: [facility] ORS;  Service: General;  Laterality: N/A;  . CATARACT EXTRACTION W/ INTRAOCULAR LENS IMPLANT Right   . CATARACT EXTRACTION W/PHACO Left 10/07/2015   Procedure: CATARACT EXTRACTION PHACO AND INTRAOCULAR LENS PLACEMENT (IOC);  Surgeon: Ronnell Freshwater, MD;  Location: [facility];  Service: Ophthalmology;  Laterality: Left;  DIABETIC - oral meds VISION BLUE  . CORONARY ANGIOGRAPHY N/A 10/29/2017   Procedure: CORONARY ANGIOGRAPHY;  Surgeon: Dionisio David, MD;  Location: [place] [facility];  Service: Cardiovascular;  Laterality: N/A;  . EXPLORATORY LAPAROTOMY     for fibroids  . HIP ARTHROPLASTY Left 12/01/2018   Procedure: ARTHROPLASTY BIPOLAR HIP (HEMIARTHROPLASTY);  Surgeon: Corky Mull, MD;  Location: [facility] OR[facility];  Service: Orthopedics;  Laterality: Left;  . LEFT HEART CATH Right 10/29/2017   Procedure: Left Heart Cath;  Surgeon: Dionisio David, MD;  Location: [place] [facility];  Service: Cardiovascular;  Laterality: Right;  . TONSILLECTOMY      Social History   Socioeconomic History  . Marital status: Widowed    Spouse name: Not on file  . Number of children: Not on file  . Years of education: Not on file  . Highest education level: Not on file  Occupational History  . Not on file  Tobacco Use  . Smoking  status: Former Research scientist (life sciences)  . Smokeless tobacco: Never Used  Substance and Sexual Activity  . Alcohol use: No  . Drug use: No  . Sexual activity: Not on file  Other Topics Concern  . Not on file  Social History Narrative  . Not on file   Social Determinants of Health   Financial Resource Strain:   . Difficulty of Paying Living Expenses: Not on file  Food Insecurity:   . Worried About Charity fundraiser in the Last Year: Not on file  . Ran Out of Food in the Last Year: Not on file  Transportation Needs:   . Lack of Transportation (Medical): Not on file  . Lack of Transportation (Non-Medical): Not on file  Physical Activity:   . Days of Exercise per Week: Not on file  . Minutes of Exercise per Session: Not on file  Stress:   . Feeling of Stress : Not on file  Social Connections:   . Frequency of Communication with Friends and Family: Not on file  . Frequency of Social Gatherings with Friends and Family: Not  on file  . Attends Religious Services: Not on file  . Active Member of Clubs or Organizations: Not on file  . Attends Archivist Meetings: Not on file  . Marital Status: Not on file  Intimate Partner Violence:   . Fear of Current or Ex-Partner: Not on file  . Emotionally Abused: Not on file  . Physically Abused: Not on file  . Sexually Abused: Not on file    Family History  Problem Relation Age of Onset  . Cancer Brother        AML  . Heart attack Mother   . Breast cancer Mother   . Heart attack Father   . Diabetes Father   . Heart attack Sister   . Diabetes Sister      Current Outpatient Medications:  .  acetaminophen (TYLENOL) 325 MG tablet, Take 1-2 tablets (325-650 mg total) by mouth every 6 (six) hours as needed for mild pain (pain score 1-3 or temp > 100.5). (Patient taking differently: Take 650 mg by mouth 3 (three) times daily. ), Disp:  , Rfl:  .  amLODipine (NORVASC) 5 MG tablet, Take 1 tablet (5 mg total) by mouth daily., Disp: 30 tablet, Rfl:  1 .  calcium-vitamin D (OSCAL WITH D) 500-200 MG-UNIT per tablet, Take 1 tablet by mouth daily. , Disp: , Rfl:  .  carvedilol (COREG) 25 MG tablet, Take 1 tablet (25 mg total) by mouth 2 (two) times daily with a meal. (Patient taking differently: Take 12.5 mg by mouth 2 (two) times daily with a meal. ), Disp: 60 tablet, Rfl: 0 .  clopidogrel (PLAVIX) 75 MG tablet, Take 1 tablet (75 mg total) by mouth daily., Disp: 90 tablet, Rfl: 0 .  fexofenadine (ALLEGRA ALLERGY) 180 MG tablet, Take 1 tablet (180 mg total) by mouth daily., Disp: , Rfl:  .  fluticasone (FLONASE) 50 MCG/ACT nasal spray, Place 2 sprays into the nose daily., Disp: , Rfl:  .  insulin detemir (LEVEMIR) 100 unit/ml SOLN, Inject 0.08 mLs (8 Units total) into the skin at bedtime. (Patient taking differently: Inject 10 Units into the skin at bedtime. ), Disp: 8 mL, Rfl: 1 .  nitroGLYCERIN (NITRODUR - DOSED IN MG/24 HR) 0.1 mg/hr patch, Place 0.1 mg onto the skin daily. , Disp: , Rfl:  .  pantoprazole (PROTONIX) 40 MG tablet, Take 1 tablet (40 mg total) by mouth daily., Disp: 30 tablet, Rfl: 0 .  pravastatin (PRAVACHOL) 20 MG tablet, Take 1 tablet (20 mg total) by mouth at bedtime., Disp: 30 tablet, Rfl: 0 .  QUEtiapine (SEROQUEL) 50 MG tablet, Take 50 mg by mouth at bedtime. , Disp: , Rfl:  .  ramipril (ALTACE) 5 MG capsule, Take 5 mg by mouth 2 (two) times daily., Disp: , Rfl:  .  sertraline (ZOLOFT) 50 MG tablet, Take 1 tablet (50 mg total) by mouth daily., Disp: 30 tablet, Rfl: 4 .  spironolactone (ALDACTONE) 25 MG tablet, Take 0.5 tablets (12.5 mg total) by mouth every other day for 30 days. (Patient taking differently: Take 12.5 mg by mouth See admin instructions. Take 12.5 mg by mouth twice daily every other day), Disp: 7.5 tablet, Rfl: 0 .  vitamin B-12 1000 MCG tablet, Take 1 tablet (1,000 mcg total) by mouth daily., Disp: 30 tablet, Rfl: 1 .  hydroxyurea (HYDREA) 500 MG capsule, Take 1 capsule (500 mg total) by mouth daily. May take  with food to minimize GI side effects., Disp: 12 capsule, Rfl: 5  NM  PET (NETSPOT GA 87 DOTATATE) SKULL BASE TO MID THIGH  Result Date: 08/03/2019 CLINICAL DATA:  Carcinoid tumor of the colon.  Liver lesion. EXAM: NUCLEAR MEDICINE PET SKULL BASE TO THIGH TECHNIQUE: 2.1 mCi Ga 61 DOTATATE was injected intravenously. Full-ring PET imaging was performed from the skull base to thigh after the radiotracer. CT data was obtained and used for attenuation correction and anatomic localization. COMPARISON:  MR abdomen 07/04/2018, PET 04/27/2018 FINDINGS: NECK No radiotracer activity in neck lymph nodes. Incidental CT findings: None CHEST No radiotracer accumulation within mediastinal or hilar lymph nodes. No suspicious pulmonary nodules on the CT scan. Incidental CT finding:Pleuroparenchymal thickening at the LEFT lung apex measuring 6 mm on image 42/3 does not have radiotracer activity. Mild basilar atelectasis. ABDOMEN/PELVIS Five discrete foci of radiotracer accumulation within the liver parenchyma with activity significantly greater than background liver activity. Example subcapsular lesion anterior RIGHT hepatic lobe with SUV max equal 16.3 and measures 2.1 cm. Lesion is subtly evident as hypodensity on the CT exam. Lesions correspond to lesions described on comparison MRI (07/04/2018). Additional lesion inferior RIGHT hepatic lobe with SUV max equal 16.9 and measuring approximately 2.9 cm. More subtle lesion in the lateral LEFT hepatic lobe with SUV max equal 8.9. No accumulation of radiotracer within the mesentery. No accumulation within the bowel. Surgical change consistent with partial colon resection in the deep RIGHT pelvis. Small solitary peritoneal implant along the ventral peritoneal surface measuring 6 mm (image 131/3) with SUV max equal 6.8. Physiologic activity noted in the liver, spleen, adrenal glands and kidneys. Incidental CT findings:Atherosclerotic calcification of the aorta. SKELETON No focal  activity to suggest skeletal metastasis. Incidental CT findings:LEFT hip prosthetic IMPRESSION: 1. Intense radiotracer activity within the multiple liver lesions consistent with well differentiated metastatic neuroendocrine tumor. 2. Single small peritoneal implant along ventral peritoneal surface. 3. No evidence of recurrent tumor in the bowel. 4. No evidence of thoracic metastasis or skeletal metastasis. 5. Coronary artery calcification and Aortic Atherosclerosis (ICD10-I70.0). Electronically Signed   By: Suzy Bouchard M.D.   On: 08/03/2019 16:56    No images are attached to the encounter.   CMP Latest Ref Rng & Units 07/20/2019  Glucose 70 - 99 mg/dL 93  BUN 8 - 23 mg/dL 29(H)  Creatinine 0.44 - 1.00 mg/dL 0.96  Sodium 135 - 145 mmol/L 140  Potassium 3.5 - 5.1 mmol/L 3.9  Chloride 98 - 111 mmol/L 105  CO2 22 - 32 mmol/L 26  Calcium 8.9 - 10.3 mg/dL 8.9  Total Protein 6.5 - 8.1 g/dL -  Total Bilirubin 0.3 - 1.2 mg/dL -  Alkaline Phos 38 - 126 U/L -  AST 15 - 41 U/L -  ALT 0 - 44 U/L -   CBC Latest Ref Rng & Units 08/21/2019  WBC 4.0 - 10.5 K/uL 8.9  Hemoglobin 12.0 - 15.0 g/dL 12.0  Hematocrit 36.0 - 46.0 % 37.7  Platelets 150 - 400 K/uL 684(H)     Observation/objective: Elderly frail lady in no acute distress.  Breathing nonlabored  Assessment and plan: Patient is a 87 year old female with following issues:  1.  Pancytopenia: Secondary to acute hospitalization and Hydrea counts have improved after Hydrea was on hold.  2.  Polycythemia vera: Prior history of JAK2 positive polycythemia vera.  Have asked her to restart hydroxyurea 500 mg 3 times a week instead of 5 times a week that she was doing before and we will monitor her CBC closely  3. 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  Patient complains of on and off diarrhea and dotatate PET scan shows at least 5 discrete foci within the liver with  intense SUV uptake and a small peritoneal implant with an SUV of 6.8.  I therefore feel that it would be reasonable to offer Sandostatin to see if it would provide symptomatic relief from her diarrhea.  Patient's niece will discuss this with the patient as well and overall patient needs with her healthcare proxy and a dentist herself is agreeable with the plan.  We will work with her insurance approval I will tentatively see her in 2 to 3 weeks time for first dose of Sandostatin.  Treatment will be given with a palliative intent  Follow-up instructions: as above  I discussed the assessment and treatment plan with the patient. The patient was provided an opportunity to ask questions and all were answered. The patient agreed with the plan and demonstrated an understanding of the instructions.   The patient was advised to call back or seek an in-person evaluation if the symptoms worsen or if the condition fails to improve as anticipated.   Visit Diagnosis: 1. Polycythemia vera ([place])   2. Other pancytopenia ([place])   3. Secondary neuroendocrine tumor of liver ([place])     Dr. Randa Evens, MD, MPH [facility] Tel- [phone] 08/23/2019 5:29 PM

## 2019-08-23 NOTE — Addendum Note (Signed)
Addended by: Randa Evens C on: 08/23/2019 06:06 PM   Modules accepted: Orders

## 2019-08-24 ENCOUNTER — Other Ambulatory Visit: Payer: Self-pay

## 2019-08-24 ENCOUNTER — Other Ambulatory Visit: Payer: Medicare PPO | Admitting: Adult Health Nurse Practitioner

## 2019-08-24 DIAGNOSIS — Z515 Encounter for palliative care: Secondary | ICD-10-CM

## 2019-08-24 DIAGNOSIS — D45 Polycythemia vera: Secondary | ICD-10-CM

## 2019-08-24 NOTE — Progress Notes (Signed)
[place] Consult Note Telephone: [phone]  Fax: [phone]  PATIENT NAME: [name] DOB: 1931/10/30 MRN: [id number]  PRIMARY CARE PROVIDER:   Baxter Hire, MD  REFERRING PROVIDER: Billey Chang NP  RESPONSIBLE PARTY:   Ree Edman, niece [phone] Patient's home phone [phone]  Due to the COVID-19 crisis, this visit was done via telemedicine and it was initiated and consent by this patient and or family. Video-audio (telehealth) contact was unable to be done due to technical barriers from the patient's side.       RECOMMENDATIONS and PLAN:  1.  Advanced care planning.  Patient listed as DNR/DNI in Epic.    2.  Polycythemia.  Patient has increased platelet levels and has had increased weakness, confusion, and SOB.  She is being seen by Dr. Janese Banks with hematology/oncology.  Is to restart hydroxyurea 500 mg 3 days a week.  She is also going to be starting octreotide monthly injections for carcinoid tumors of the liver.  Patient also has intermittent diarrhea and the octreotide would help with this as well.  Discussed that this disease progression could be causing her symptoms and that may start seeing improvement once restarting therapy.  Has appointment with Dr. Janese Banks this coming Monday. Continue follow up and recommendations by Dr. Janese Banks.    3.  Secretions.  Patient has started using flonase daily.  Niece states that this has helped with her watery eyes and increased saliva.  Continue using the flonase daily.  4. Hallucinations.  Patient's seroquel has been increased to 50 mg QHS.  Caregiver states that she is sleeping better and not getting up as much at night.  Hallucinations have lessened.  Continue current dose of seroquel and monitoring for effectiveness.    5.  Pain.  Have started using lidocaine cream with voltaren gel on knees with some improvement in pain.  Have started Tramadol 50 mg for the knee pain as well.   Niece states that she has only given her half a tab once and she tolerated well but did not think it helped much with the pain.  Has appointment with pain clinic next week.  Continue follow up and recommendations by pain clinic  Palliative will continue to monitor for symptom management/decline and make recommendations as needed.  Have appointment in 4 weeks to monitor effectiveness of upcoming treatments for polycythemia    I spent 50 minutes providing this consultation,  from 12:30 to 1:20. More than 50% of the time in this consultation was spent coordinating communication.   HISTORY OF PRESENT ILLNESS:  [name] is a 86 y.o. year old female with multiple medical problems including polycythemia, DMT2, DDD, h/o TIAs, h/o endometrial carcinoma. Palliative Care was asked to help address goals of care.   CODE STATUS: DNR/DNI  PPS: 50% HOSPICE ELIGIBILITY/DIAGNOSIS: TBD  PHYSICAL EXAM:   Deferred   PAST MEDICAL HISTORY:  Past Medical History:  Diagnosis Date  . Adenocarcinoma in situ of cervix   . Arthritis    hands  . Breast cancer ([place])   . Colon cancer ([place])   . Diabetes mellitus without complication ([place])   . Endometrial carcinoma ([facility])    s/p total abdominal hysterectomy  . H/O compression fracture of spine 2014   thoracic spine  . H/O polycythemia vera   . H/O TIA (transient ischemic attack) and stroke 09/2014, 03/2015   No deficits  . Hypertension   . Hyperuricemia   . Microalbuminuria   .  Myocardial infarction ([place])   . Polycythemia vera ([place])   . Recurrent falls   . Skin cancer    face, legs  . Stroke [facility]) 2008   no deficits  . Trochanteric bursitis   . Varicose veins    treated    SOCIAL HX:  Social History   Tobacco Use  . Smoking status: Former Research scientist (life sciences)  . Smokeless tobacco: Never Used  Substance Use Topics  . Alcohol use: No    ALLERGIES:  Allergies  Allergen Reactions  . Other Other (See Comments)    ALL OPIOIDS   . Simvastatin Other (See  Comments)    Myalgia     PERTINENT MEDICATIONS:  Outpatient Encounter Medications as of 08/24/2019  Medication Sig  . acetaminophen (TYLENOL) 325 MG tablet Take 1-2 tablets (325-650 mg total) by mouth every 6 (six) hours as needed for mild pain (pain score 1-3 or temp > 100.5). (Patient taking differently: Take 650 mg by mouth 3 (three) times daily. )  . amLODipine (NORVASC) 5 MG tablet Take 1 tablet (5 mg total) by mouth daily.  . calcium-vitamin D (OSCAL WITH D) 500-200 MG-UNIT per tablet Take 1 tablet by mouth daily.   . carvedilol (COREG) 25 MG tablet Take 1 tablet (25 mg total) by mouth 2 (two) times daily with a meal. (Patient taking differently: Take 12.5 mg by mouth 2 (two) times daily with a meal. )  . clopidogrel (PLAVIX) 75 MG tablet Take 1 tablet (75 mg total) by mouth daily.  . fexofenadine (ALLEGRA ALLERGY) 180 MG tablet Take 1 tablet (180 mg total) by mouth daily.  . fluticasone (FLONASE) 50 MCG/ACT nasal spray Place 2 sprays into the nose daily.  . hydroxyurea (HYDREA) 500 MG capsule Take 1 capsule (500 mg total) by mouth daily. May take with food to minimize GI side effects.  . insulin detemir (LEVEMIR) 100 unit/ml SOLN Inject 0.08 mLs (8 Units total) into the skin at bedtime. (Patient taking differently: Inject 10 Units into the skin at bedtime. )  . nitroGLYCERIN (NITRODUR - DOSED IN MG/24 HR) 0.1 mg/hr patch Place 0.1 mg onto the skin daily.   . pantoprazole (PROTONIX) 40 MG tablet Take 1 tablet (40 mg total) by mouth daily.  . pravastatin (PRAVACHOL) 20 MG tablet Take 1 tablet (20 mg total) by mouth at bedtime.  Marland Kitchen QUEtiapine (SEROQUEL) 50 MG tablet Take 50 mg by mouth at bedtime.   . ramipril (ALTACE) 5 MG capsule Take 5 mg by mouth 2 (two) times daily.  . sertraline (ZOLOFT) 50 MG tablet Take 1 tablet (50 mg total) by mouth daily.  Marland Kitchen spironolactone (ALDACTONE) 25 MG tablet Take 0.5 tablets (12.5 mg total) by mouth every other day for 30 days. (Patient taking differently: Take  12.5 mg by mouth See admin instructions. Take 12.5 mg by mouth twice daily every other day)  . vitamin B-12 1000 MCG tablet Take 1 tablet (1,000 mcg total) by mouth daily.   No facility-administered encounter medications on file as of 08/24/2019.     Jazmynn Pho Jenetta Downer, NP

## 2019-08-27 ENCOUNTER — Other Ambulatory Visit: Payer: Self-pay | Admitting: *Deleted

## 2019-08-27 DIAGNOSIS — C7B8 Other secondary neuroendocrine tumors: Secondary | ICD-10-CM

## 2019-08-27 DIAGNOSIS — D45 Polycythemia vera: Secondary | ICD-10-CM

## 2019-08-28 ENCOUNTER — Other Ambulatory Visit: Payer: Self-pay

## 2019-08-28 ENCOUNTER — Inpatient Hospital Stay (HOSPITAL_BASED_OUTPATIENT_CLINIC_OR_DEPARTMENT_OTHER): Payer: Medicare PPO | Admitting: Oncology

## 2019-08-28 ENCOUNTER — Inpatient Hospital Stay: Payer: Medicare PPO

## 2019-08-28 ENCOUNTER — Encounter: Payer: Self-pay | Admitting: Oncology

## 2019-08-28 ENCOUNTER — Other Ambulatory Visit
Admission: RE | Admit: 2019-08-28 | Discharge: 2019-08-28 | Disposition: A | Discharge status: Home / Self Care | Payer: Medicare PPO | Source: Ambulatory Visit | Attending: Oncology | Admitting: Oncology

## 2019-08-28 VITALS — BP 134/74 | HR 57 | Temp 95.7°F | Wt 100.0 lb

## 2019-08-28 DIAGNOSIS — D45 Polycythemia vera: Secondary | ICD-10-CM | POA: Diagnosis present

## 2019-08-28 DIAGNOSIS — Z79899 Other long term (current) drug therapy: Secondary | ICD-10-CM

## 2019-08-28 DIAGNOSIS — D473 Essential (hemorrhagic) thrombocythemia: Secondary | ICD-10-CM | POA: Diagnosis not present

## 2019-08-28 DIAGNOSIS — C7B8 Other secondary neuroendocrine tumors: Secondary | ICD-10-CM

## 2019-08-28 DIAGNOSIS — C7A012 Malignant carcinoid tumor of the ileum: Secondary | ICD-10-CM | POA: Diagnosis not present

## 2019-08-28 DIAGNOSIS — C7B02 Secondary carcinoid tumors of liver: Secondary | ICD-10-CM

## 2019-08-28 LAB — CBC WITH DIFFERENTIAL/PLATELET
Abs Immature Granulocytes: 0.12 10*3/uL — ABNORMAL HIGH (ref 0.00–0.07)
Basophils Absolute: 0.1 10*3/uL (ref 0.0–0.1)
Basophils Relative: 1 %
Eosinophils Absolute: 0.2 10*3/uL (ref 0.0–0.5)
Eosinophils Relative: 2 %
HCT: 36.6 % (ref 36.0–46.0)
Hemoglobin: 11.6 g/dL — ABNORMAL LOW (ref 12.0–15.0)
Immature Granulocytes: 1 %
Lymphocytes Relative: 11 %
Lymphs Abs: 1.1 10*3/uL (ref 0.7–4.0)
MCH: 35.4 pg — ABNORMAL HIGH (ref 26.0–34.0)
MCHC: 31.7 g/dL (ref 30.0–36.0)
MCV: 111.6 fL — ABNORMAL HIGH (ref 80.0–100.0)
Monocytes Absolute: 0.6 10*3/uL (ref 0.1–1.0)
Monocytes Relative: 6 %
Neutro Abs: 8 10*3/uL — ABNORMAL HIGH (ref 1.7–7.7)
Neutrophils Relative %: 79 %
Platelets: 715 10*3/uL — ABNORMAL HIGH (ref 150–400)
RBC: 3.28 MIL/uL — ABNORMAL LOW (ref 3.87–5.11)
RDW: 17.2 % — ABNORMAL HIGH (ref 11.5–15.5)
WBC: 10.2 10*3/uL (ref 4.0–10.5)
nRBC: 0.2 % (ref 0.0–0.2)

## 2019-08-28 MED ORDER — HYDROXYUREA 500 MG PO CAPS: 500.0000 mg | ORAL_CAPSULE | Freq: Every day | ORAL | 5 refills | Status: DC

## 2019-08-28 MED ORDER — OCTREOTIDE ACETATE 30 MG IM KIT
30.0000 mg | PACK | INTRAMUSCULAR | Status: DC
  Administered 2019-08-28: 30 mg via INTRAMUSCULAR
  Filled 2019-08-28: qty 1

## 2019-08-29 LAB — CHROMOGRANIN A: Chromogranin A (ng/mL): 350 ng/mL — ABNORMAL HIGH (ref 0.0–101.8)

## 2019-09-03 NOTE — Progress Notes (Signed)
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 Mike Gip for polycythemia vera/ET and she was on Hydrea for the same. Her last visit with her as an outpatient was in May 2020. She was taking hydroxyurea 500 mg 5 times a week. 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 Tumor was 2.5 cm with 9+ lymph nodes pathologic stage was T3N1. Her chromogranin levels since then have been monitored regularly and they have been chronically elevated between 200-2 50. Patient was noted to have liver lesions but did not have any significant metabolic uptake on PET CT scan in November 2019. At her last visit with Dr. Mike Gip in May 2020 plan was to get dotatate PET scan followed by consideration for liver biopsy and at that time patient was lost to follow-up   More recently patient was admitted to the hospital last week for generalized weakness and following a fall and was noted to have significant pancytopenia her hemoglobin had dropped down to 6.8 requiring a blood transfusion. Typically her platelet counts which are between 3 50-400 were down to 179. Also she was noted to have significant neutropenia with an ANC of 0.6 and a white cell count of 1.1. Hydroxyurea was kept on hold and patient had extensive work-up including TSH B12  folate. B12 levels were low at 214. Reticulocyte count was low at 1.7% indicative of a hypoproliferative anemia. Chromogranin A levels were elevated at 440.    Interval history- no acute issues since last visit. Still has on and off diarrhea  ECOG PS- 2 Pain scale- 0  Review of systems- Review of Systems  Constitutional: Positive for malaise/fatigue. Negative for chills, fever and weight loss.  HENT: Negative for congestion, ear discharge and nosebleeds.   Eyes: Negative for blurred vision.  Respiratory: Negative for cough, hemoptysis, sputum production, shortness of breath and wheezing.   Cardiovascular: Negative for chest pain, palpitations, orthopnea and claudication.  Gastrointestinal: Positive for diarrhea. Negative for abdominal pain, blood in stool, constipation, heartburn, melena, nausea and vomiting.  Genitourinary: Negative for dysuria, flank pain, frequency, hematuria and urgency.  Musculoskeletal: Negative for back pain, joint pain and myalgias.  Skin: Negative for rash.  Neurological: Negative for dizziness, tingling, focal weakness, seizures, weakness and headaches.  Endo/Heme/Allergies: Does not bruise/bleed easily.  Psychiatric/Behavioral: Negative for depression and suicidal ideas. The patient does not have insomnia.       Allergies  Allergen Reactions  . Other Other (See Comments)    ALL OPIOIDS   . Simvastatin Other (See Comments)    Myalgia     Past Medical History:  Diagnosis Date  . Adenocarcinoma in situ of cervix   . Arthritis    hands  . Breast cancer ([place])   . Colon cancer ([place])   . Diabetes mellitus without complication ([place])   . Endometrial carcinoma (H[facility])    s/p total  abdominal hysterectomy  . H/O compression fracture of spine 2014   thoracic spine  . H/O polycythemia vera   . H/O TIA (transient ischemic attack) and stroke 09/2014, 03/2015   No deficits  . Hypertension   . Hyperuricemia   . Microalbuminuria   . Myocardial infarction  ([place])   . Polycythemia vera ([place])   . Recurrent falls   . Skin cancer    face, legs  . Stroke [facility]) 2008   no deficits  . Trochanteric bursitis   . Varicose veins    treated     Past Surgical History:  Procedure Laterality Date  . ABDOMINAL HYSTERECTOMY    . BOWEL RESECTION N/A 03/28/2015   Procedure: SMALL BOWEL RESECTION;  Surgeon: Leonie Green, MD;  Location: [facility] ORS;  Service: General;  Laterality: N/A;  . CATARACT EXTRACTION W/ INTRAOCULAR LENS IMPLANT Right   . CATARACT EXTRACTION W/PHACO Left 10/07/2015   Procedure: CATARACT EXTRACTION PHACO AND INTRAOCULAR LENS PLACEMENT (IOC);  Surgeon: Ronnell Freshwater, MD;  Location: [place];  Service: Ophthalmology;  Laterality: Left;  DIABETIC - oral meds VISION BLUE  . CORONARY ANGIOGRAPHY N/A 10/29/2017   Procedure: CORONARY ANGIOGRAPHY;  Surgeon: Dionisio David, MD;  Location: [place] [facility];  Service: Cardiovascular;  Laterality: N/A;  . EXPLORATORY LAPAROTOMY     for fibroids  . HIP ARTHROPLASTY Left 12/01/2018   Procedure: ARTHROPLASTY BIPOLAR HIP (HEMIARTHROPLASTY);  Surgeon: Corky Mull, MD;  Location: [facility] OR[facility];  Service: Orthopedics;  Laterality: Left;  . LEFT HEART CATH Right 10/29/2017   Procedure: Left Heart Cath;  Surgeon: Dionisio David, MD;  Location: [place] [facility];  Service: Cardiovascular;  Laterality: Right;  . TONSILLECTOMY      Social History   Socioeconomic History  . Marital status: Widowed    Spouse name: Not on file  . Number of children: Not on file  . Years of education: Not on file  . Highest education level: Not on file  Occupational History  . Not on file  Tobacco Use  . Smoking status: Former Research scientist (life sciences)  . Smokeless tobacco: Never Used  Substance and Sexual Activity  . Alcohol use: No  . Drug use: No  . Sexual activity: Not on file  Other Topics Concern  . Not on file  Social History Narrative  . Not on file   Social Determinants of Health    Financial Resource Strain:   . Difficulty of Paying Living Expenses:   Food Insecurity:   . Worried About Charity fundraiser in the Last Year:   . Arboriculturist in the Last Year:   Transportation Needs:   . Film/video editor (Medical):   Marland Kitchen Lack of Transportation (Non-Medical):   Physical Activity:   . Days of Exercise per Week:   . Minutes of Exercise per Session:   Stress:   . Feeling of Stress :   Social Connections:   . Frequency of Communication with Friends and Family:   . Frequency of Social Gatherings with Friends and Family:   . Attends Religious Services:   . Active Member of Clubs or Organizations:   . Attends Archivist Meetings:   Marland Kitchen Marital Status:   Intimate Partner Violence:   . Fear of Current or Ex-Partner:   . Emotionally Abused:   Marland Kitchen Physically Abused:   . Sexually Abused:     Family History  Problem Relation Age of Onset  . Cancer Brother  AML  . Heart attack Mother   . Breast cancer Mother   . Heart attack Father   . Diabetes Father   . Heart attack Sister   . Diabetes Sister      Current Outpatient Medications:  .  acetaminophen (TYLENOL) 325 MG tablet, Take 1-2 tablets (325-650 mg total) by mouth every 6 (six) hours as needed for mild pain (pain score 1-3 or temp > 100.5). (Patient taking differently: Take 650 mg by mouth 3 (three) times daily. ), Disp:  , Rfl:  .  amLODipine (NORVASC) 5 MG tablet, Take 1 tablet (5 mg total) by mouth daily., Disp: 30 tablet, Rfl: 1 .  calcium-vitamin D (OSCAL WITH D) 500-200 MG-UNIT per tablet, Take 1 tablet by mouth daily. , Disp: , Rfl:  .  carvedilol (COREG) 25 MG tablet, Take 1 tablet (25 mg total) by mouth 2 (two) times daily with a meal. (Patient taking differently: Take 12.5 mg by mouth 2 (two) times daily with a meal. ), Disp: 60 tablet, Rfl: 0 .  clopidogrel (PLAVIX) 75 MG tablet, Take 1 tablet (75 mg total) by mouth daily., Disp: 90 tablet, Rfl: 0 .  fexofenadine (ALLEGRA  ALLERGY) 180 MG tablet, Take 1 tablet (180 mg total) by mouth daily., Disp: , Rfl:  .  fluticasone (FLONASE) 50 MCG/ACT nasal spray, Place 2 sprays into the nose daily., Disp: , Rfl:  .  insulin detemir (LEVEMIR) 100 unit/ml SOLN, Inject 0.08 mLs (8 Units total) into the skin at bedtime. (Patient taking differently: Inject 10 Units into the skin at bedtime. ), Disp: 8 mL, Rfl: 1 .  nitroGLYCERIN (NITRODUR - DOSED IN MG/24 HR) 0.1 mg/hr patch, Place 0.1 mg onto the skin daily. , Disp: , Rfl:  .  pantoprazole (PROTONIX) 40 MG tablet, Take 1 tablet (40 mg total) by mouth daily., Disp: 30 tablet, Rfl: 0 .  pravastatin (PRAVACHOL) 20 MG tablet, Take 1 tablet (20 mg total) by mouth at bedtime., Disp: 30 tablet, Rfl: 0 .  QUEtiapine (SEROQUEL) 50 MG tablet, Take 50 mg by mouth at bedtime. , Disp: , Rfl:  .  ramipril (ALTACE) 5 MG capsule, Take 5 mg by mouth 2 (two) times daily., Disp: , Rfl:  .  sertraline (ZOLOFT) 50 MG tablet, Take 1 tablet (50 mg total) by mouth daily., Disp: 30 tablet, Rfl: 4 .  spironolactone (ALDACTONE) 25 MG tablet, Take 0.5 tablets (12.5 mg total) by mouth every other day for 30 days. (Patient taking differently: Take 12.5 mg by mouth See admin instructions. Take 12.5 mg by mouth twice daily every other day), Disp: 7.5 tablet, Rfl: 0 .  vitamin B-12 1000 MCG tablet, Take 1 tablet (1,000 mcg total) by mouth daily., Disp: 30 tablet, Rfl: 1 .  hydroxyurea (HYDREA) 500 MG capsule, Take 1 capsule (500 mg total) by mouth daily. Take daily for six days then off one day and repeat weekly. May take with food to minimize GI side effects., Disp: 26 capsule, Rfl: 5  Physical exam:  Vitals:   08/28/19 1514  BP: 134/74  Pulse: (!) 57  Temp: (!) 95.7 F (35.4 C)  TempSrc: Tympanic  Weight: 100 lb (45.4 kg)   Physical Exam Constitutional:      Comments: Thin elderly female sitting in a Wheelchair.appears in no acute distress  Cardiovascular:     Rate and Rhythm: Normal rate and regular  rhythm.     Heart sounds: Normal heart sounds.  Pulmonary:     Effort: Pulmonary effort  is normal.  Abdominal:     General: Bowel sounds are normal.     Palpations: Abdomen is soft.  Skin:    General: Skin is warm and dry.  Neurological:     Mental Status: She is alert.      CMP Latest Ref Rng & Units 07/20/2019  Glucose 70 - 99 mg/dL 93  BUN 8 - 23 mg/dL 29(H)  Creatinine 0.44 - 1.00 mg/dL 0.96  Sodium 135 - 145 mmol/L 140  Potassium 3.5 - 5.1 mmol/L 3.9  Chloride 98 - 111 mmol/L 105  CO2 22 - 32 mmol/L 26  Calcium 8.9 - 10.3 mg/dL 8.9  Total Protein 6.5 - 8.1 g/dL -  Total Bilirubin 0.3 - 1.2 mg/dL -  Alkaline Phos 38 - 126 U/L -  AST 15 - 41 U/L -  ALT 0 - 44 U/L -   CBC Latest Ref Rng & Units 08/28/2019  WBC 4.0 - 10.5 K/uL 10.2  Hemoglobin 12.0 - 15.0 g/dL 11.6(L)  Hematocrit 36.0 - 46.0 % 36.6  Platelets 150 - 400 K/uL 715(H)    Assessment and plan- Patient is a 86 y.o. female who is here for following issues:  1. JAK2 positive ET: I have reviewed patient's old records when patient was seeing Dr. Milana Obey.  She was diagnosed with JAK2 positive essential thrombocytosis and not polycythemia vera.  After her recent hospitalization for pancytopenia I have cut back on Hydrea dose to 500 mg 3 times a week.  However the platelets of 715 today and I would therefore like her to go back to her old dose of Hydrea 500 mg daily for 6 times a week.  I will repeat her CBC in 1 month's time and based on her counts decide about further titration.  I have spoken to her niece Dr. Amie Critchley about this in detail.  Patient is currently on Plavix and therefore not on low-dose aspirin given the risk of bleeding and frequent bruising.  2.  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  I will therefore start her on Sandostatin 30 mg IM which she will get on a monthly  basis.  I will see her back in 1 month's time with CBC with differential CMP and chromogranin A   Visit Diagnosis 1. Malignant carcinoid tumor of ileum ([place])   2. 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 Essential thrombocytosis ([place])      Dr. Randa Evens, MD, MPH [facility] [id number] 09/03/2019 7:09 PM

## 2019-09-21 ENCOUNTER — Other Ambulatory Visit: Payer: Medicare PPO | Admitting: Adult Health Nurse Practitioner

## 2019-09-21 ENCOUNTER — Other Ambulatory Visit: Payer: Self-pay

## 2019-09-21 DIAGNOSIS — Z515 Encounter for palliative care: Secondary | ICD-10-CM

## 2019-09-21 DIAGNOSIS — D45 Polycythemia vera: Secondary | ICD-10-CM

## 2019-09-22 NOTE — Progress Notes (Signed)
[place] Consult Note Telephone: [phone]  Fax: [phone]  PATIENT NAME: [name] DOB: [birth year] MRN: [id number]  PRIMARY CARE PROVIDER:   Baxter Hire, MD  REFERRING PROVIDER:  Billey Chang NP  RESPONSIBLE PARTY:   [name], niece [phone] Patient's home phone [phone]      RECOMMENDATIONS and PLAN:  1.  Advanced care planning. Patient listed as DNR/DNI.  Left VM with niece to update on visit.  Will call back in 4 weeks to set up next visit.  2.  Polycythemia.  She is being seen by Dr. Janese [name] with hematology/oncology.  She has restarted hydroxyurea 500 mg 3 days a week.  Patient and her caregiver deny increased SOB, N/V/D, constipation, chest pain, palpitations.  She has had a fall about 2 weeks ago.  She does have skin tears to right hand and left forearm from the fall.  Did not take off bandages for exam and caregiver stated that the skin tears are healing.  Patient is able to ambulate with a walker.  Requires assistance with ADLs.  Her appetite is the same and weight staying around 96 pounds.  Continue follow up and recommendations by Dr. Janese [name].    3.  Hallucinations.  Caregiver states that she is sleeping well and not getting up as much at night.  Hallucinations have lessened.  Continue current dose of seroquel   4.  Pain.  Lidocaine cream and voltaren are effective for her OA pain in knees.  She does use Tylenol and tramadol as needed with relief.  Is being followed by pain clinic.  Continue follow up and recommendations by pain clinic  Palliative will continue to monitor for symptom management/decline and make recommendations as needed.   I spent 50 minutes providing this consultation,  from 1:10 to 2:00 including time with patient/family, chart review, provider coordination, and documentation. More than 50% of the time in this consultation was spent coordinating communication.   HISTORY OF PRESENT  ILLNESS:  [name] is a 86 y.o. year old female with multiple medical problems including polycythemia, DMT2, DDD, h/o TIAs, h/o endometrial carcinoma. Palliative Care was asked to help address goals of care.   CODE STATUS: DNR/DNI  PPS: 50% HOSPICE ELIGIBILITY/DIAGNOSIS: TBD  PHYSICAL EXAM:  BP 118/62  HR 57  O2 95% on RA General: NAD, frail appearing, thin Cardiovascular: regular rate and rhythm Pulmonary: lung sounds clear; normal respiratory effort Abdomen: soft, nontender, + bowel sounds GU: no suprapubic tenderness Extremities: no edema, no joint deformities Neurological: Weakness;  A&O to  Person and place   PAST MEDICAL HISTORY:  Past Medical History:  Diagnosis Date  . Adenocarcinoma in situ of cervix   . Arthritis    hands  . Breast cancer ([place])   . Colon cancer ([place])   . Diabetes mellitus without complication ([place])   . Endometrial carcinoma ([facility])    s/p total abdominal hysterectomy  . H/O compression fracture of spine 2014   thoracic spine  . H/O polycythemia vera   . H/O TIA (transient ischemic attack) and stroke 09/2014, 03/2015   No deficits  . Hypertension   . Hyperuricemia   . Microalbuminuria   . Myocardial infarction ([place])   . Polycythemia vera ([place])   . Recurrent falls   . Skin cancer    face, legs  . Stroke [facility]) 2008   no deficits  . Trochanteric bursitis   . Varicose veins    treated  SOCIAL HX:  Social History   Tobacco Use  . Smoking status: Former Research scientist (life sciences)  . Smokeless tobacco: Never Used  Substance Use Topics  . Alcohol use: No    ALLERGIES:  Allergies  Allergen Reactions  . Other Other (See Comments)    ALL OPIOIDS   . Simvastatin Other (See Comments)    Myalgia     PERTINENT MEDICATIONS:  Outpatient Encounter Medications as of 09/21/2019  Medication Sig  . acetaminophen (TYLENOL) 325 MG tablet Take 1-2 tablets (325-650 mg total) by mouth every 6 (six) hours as needed for mild pain (pain score 1-3 or temp > 100.5).  (Patient taking differently: Take 650 mg by mouth 3 (three) times daily. )  . amLODipine (NORVASC) 5 MG tablet Take 1 tablet (5 mg total) by mouth daily.  . calcium-vitamin D (OSCAL WITH D) 500-200 MG-UNIT per tablet Take 1 tablet by mouth daily.   . carvedilol (COREG) 25 MG tablet Take 1 tablet (25 mg total) by mouth 2 (two) times daily with a meal. (Patient taking differently: Take 12.5 mg by mouth 2 (two) times daily with a meal. )  . clopidogrel (PLAVIX) 75 MG tablet Take 1 tablet (75 mg total) by mouth daily.  . fexofenadine (ALLEGRA ALLERGY) 180 MG tablet Take 1 tablet (180 mg total) by mouth daily.  . fluticasone (FLONASE) 50 MCG/ACT nasal spray Place 2 sprays into the nose daily.  . hydroxyurea (HYDREA) 500 MG capsule Take 1 capsule (500 mg total) by mouth daily. Take daily for six days then off one day and repeat weekly. May take with food to minimize GI side effects.  . insulin detemir (LEVEMIR) 100 unit/ml SOLN Inject 0.08 mLs (8 Units total) into the skin at bedtime. (Patient taking differently: Inject 10 Units into the skin at bedtime. )  . nitroGLYCERIN (NITRODUR - DOSED IN MG/24 HR) 0.1 mg/hr patch Place 0.1 mg onto the skin daily.   . pantoprazole (PROTONIX) 40 MG tablet Take 1 tablet (40 mg total) by mouth daily.  . pravastatin (PRAVACHOL) 20 MG tablet Take 1 tablet (20 mg total) by mouth at bedtime.  Marland Kitchen QUEtiapine (SEROQUEL) 50 MG tablet Take 50 mg by mouth at bedtime.   . ramipril (ALTACE) 5 MG capsule Take 5 mg by mouth 2 (two) times daily.  . sertraline (ZOLOFT) 50 MG tablet Take 1 tablet (50 mg total) by mouth daily.  . vitamin B-12 1000 MCG tablet Take 1 tablet (1,000 mcg total) by mouth daily.   No facility-administered encounter medications on file as of 09/21/2019.     [name] April Downer, NP

## 2019-09-28 ENCOUNTER — Inpatient Hospital Stay: Payer: Medicare PPO | Attending: Oncology

## 2019-09-28 ENCOUNTER — Other Ambulatory Visit: Payer: Self-pay

## 2019-09-28 ENCOUNTER — Inpatient Hospital Stay: Payer: Medicare PPO

## 2019-09-28 DIAGNOSIS — D45 Polycythemia vera: Secondary | ICD-10-CM

## 2019-09-28 DIAGNOSIS — R197 Diarrhea, unspecified: Secondary | ICD-10-CM | POA: Diagnosis not present

## 2019-09-28 DIAGNOSIS — C7B02 Secondary carcinoid tumors of liver: Secondary | ICD-10-CM | POA: Diagnosis present

## 2019-09-28 DIAGNOSIS — C7A012 Malignant carcinoid tumor of the ileum: Secondary | ICD-10-CM | POA: Diagnosis present

## 2019-09-28 DIAGNOSIS — Z79899 Other long term (current) drug therapy: Secondary | ICD-10-CM | POA: Insufficient documentation

## 2019-09-28 DIAGNOSIS — D473 Essential (hemorrhagic) thrombocythemia: Secondary | ICD-10-CM | POA: Insufficient documentation

## 2019-09-28 LAB — CBC WITH DIFFERENTIAL/PLATELET
Abs Immature Granulocytes: 0.04 10*3/uL (ref 0.00–0.07)
Basophils Absolute: 0.1 10*3/uL (ref 0.0–0.1)
Basophils Relative: 1 %
Eosinophils Absolute: 0.2 10*3/uL (ref 0.0–0.5)
Eosinophils Relative: 2 %
HCT: 39.1 % (ref 36.0–46.0)
Hemoglobin: 13 g/dL (ref 12.0–15.0)
Immature Granulocytes: 1 %
Lymphocytes Relative: 7 %
Lymphs Abs: 0.6 10*3/uL — ABNORMAL LOW (ref 0.7–4.0)
MCH: 35.3 pg — ABNORMAL HIGH (ref 26.0–34.0)
MCHC: 33.2 g/dL (ref 30.0–36.0)
MCV: 106.3 fL — ABNORMAL HIGH (ref 80.0–100.0)
Monocytes Absolute: 0.2 10*3/uL (ref 0.1–1.0)
Monocytes Relative: 2 %
Neutro Abs: 6.8 10*3/uL (ref 1.7–7.7)
Neutrophils Relative %: 87 %
Platelets: 251 10*3/uL (ref 150–400)
RBC: 3.68 MIL/uL — ABNORMAL LOW (ref 3.87–5.11)
RDW: 21 % — ABNORMAL HIGH (ref 11.5–15.5)
WBC: 7.8 10*3/uL (ref 4.0–10.5)
nRBC: 0 % (ref 0.0–0.2)

## 2019-09-28 LAB — COMPREHENSIVE METABOLIC PANEL
ALT: 17 U/L (ref 0–44)
AST: 18 U/L (ref 15–41)
Albumin: 4.5 g/dL (ref 3.5–5.0)
Alkaline Phosphatase: 48 U/L (ref 38–126)
Anion gap: 12 (ref 5–15)
BUN: 32 mg/dL — ABNORMAL HIGH (ref 8–23)
CO2: 30 mmol/L (ref 22–32)
Calcium: 9.1 mg/dL (ref 8.9–10.3)
Chloride: 96 mmol/L — ABNORMAL LOW (ref 98–111)
Creatinine, Ser: 1.28 mg/dL — ABNORMAL HIGH (ref 0.44–1.00)
GFR calc Af Amer: 44 mL/min — ABNORMAL LOW (ref >60)
GFR calc non Af Amer: 38 mL/min — ABNORMAL LOW (ref >60)
Glucose, Bld: 191 mg/dL — ABNORMAL HIGH (ref 70–99)
Potassium: 4 mmol/L (ref 3.5–5.1)
Sodium: 138 mmol/L (ref 135–145)
Total Bilirubin: 1 mg/dL (ref 0.3–1.2)
Total Protein: 7.2 g/dL (ref 6.5–8.1)

## 2019-09-28 MED ORDER — OCTREOTIDE ACETATE 30 MG IM KIT
30.0000 mg | PACK | INTRAMUSCULAR | Status: DC
  Administered 2019-09-28: 30 mg via INTRAMUSCULAR
  Filled 2019-09-28: qty 1

## 2019-09-29 LAB — CHROMOGRANIN A: Chromogranin A (ng/mL): 218.2 ng/mL — ABNORMAL HIGH (ref 0.0–101.8)

## 2019-10-04 ENCOUNTER — Other Ambulatory Visit: Payer: Self-pay

## 2019-10-04 ENCOUNTER — Encounter: Payer: Self-pay | Admitting: Adult Health

## 2019-10-04 ENCOUNTER — Ambulatory Visit: Payer: Medicare PPO | Admitting: Adult Health

## 2019-10-04 VITALS — BP 121/72 | HR 58 | Temp 98.7°F | Wt 100.0 lb

## 2019-10-04 DIAGNOSIS — I615 Nontraumatic intracerebral hemorrhage, intraventricular: Secondary | ICD-10-CM

## 2019-10-04 DIAGNOSIS — E118 Type 2 diabetes mellitus with unspecified complications: Secondary | ICD-10-CM | POA: Diagnosis not present

## 2019-10-04 DIAGNOSIS — I614 Nontraumatic intracerebral hemorrhage in cerebellum: Secondary | ICD-10-CM

## 2019-10-04 DIAGNOSIS — I69319 Unspecified symptoms and signs involving cognitive functions following cerebral infarction: Secondary | ICD-10-CM

## 2019-10-04 DIAGNOSIS — R471 Dysarthria and anarthria: Secondary | ICD-10-CM

## 2019-10-04 DIAGNOSIS — E785 Hyperlipidemia, unspecified: Secondary | ICD-10-CM

## 2019-10-04 DIAGNOSIS — Z794 Long term (current) use of insulin: Secondary | ICD-10-CM

## 2019-10-04 DIAGNOSIS — I10 Essential (primary) hypertension: Secondary | ICD-10-CM

## 2019-10-04 NOTE — Progress Notes (Signed)
[facility] [address] [place]. [place] [address] [phone]       STROKE FOLLOW UP NOTE  [name] Date of Birth:  [birth year] Medical Record Number:  [id number]   Reason for visit: stroke follow up  Chief complaint: Chief Complaint  Patient presents with  . Follow-up    rm 74, with niece, hx of stroke     HPI:  [name] is a 86 year old female who is being seen today, 10/04/2019, for stroke follow-up accompanied by her niece.  She has been stable from a stroke standpoint with residual stroke deficits of dysarthria and cognitive impairment.  Continues on sertraline 50 mg daily with stable mood as well as Seroquel 50 mg nightly which was recently increased by PCP due to sleeping difficulties with significant benefit.  Recently being followed by pain management due to bilateral knee pain R >L which limits ambulation and has been started on tramadol 25 mg every other day with benefit.  Shortly after prior visit, she presented to ED on 07/18/2019 after mechanical fall and generalized weakness and found to have leukopenia and anemia likely secondary to hydroxyurea toxicity requiring transfusion.  She continues to follow with oncology regularly for polycythemia and use of hydroxyurea.  She continues to live in her own home but does have 24-hour caregivers.  Continues on Plavix and pravastatin for stroke prevention and blood pressure today 121/72.  No further concerns at this time.     History provided for reference purposes only Virtual visit 07/05/2019 JM: [name] is a 86 year old female who is being seen today via virtual visit for stroke follow-up accompanied by her niece.  She has not been seen in over 1 year due to COVID-19 safety precautions.  Residual deficits of dysarthria with hesitancy with wax/wane of symptoms as well as cognitive impairment.  Per daughter, she feels as though her speech has been gradually worsening as far as speaking slower or  stuttering as well as worsening memory loss.  She does endorse increased stress and possible depression/anxiety with concerns of COVID-19 pandemic along with constantly being in pain with ongoing chronic knee pain.  She continues to receive 24-hour care for ongoing assistance.  She has continued on Plavix and pravastatin for secondary stroke prevention without side effects.  Prior A1c 7.0 on 11/30/2018.  Blood pressure 128-130/60-70. She unfortunately suffered a close subcapital fracture left femur after a fall on 11/30/2018 requiring open reduction internal fixation procedure. Healed well from hip stand point. Pain in her left knee possibly from arthritis per patient. Needs walker for ambulation. Has been folowing up with ortho with receiving injections without benefit.  Recently referred to pain management clinic for ongoing management.  Denies new or worsening stroke/TIA symptoms.  Interval history 03/23/18 JM: Patient is being seen today for scheduled follow-up visit and is accompanied by her niece.  She continues to have some stuttering concerns and right hand weakness but has been improving.  She participates in PT/OT/ST at [facility] and continues to live independently with 24/7 care.  Aids to assist with ADLs and IADLs.  Patient is questioning whether therapy continued at this time.  She continues to take Plavix only without side effects of bleeding or bruising.  She was planning on undergoing stent procedure by cardiologist at [facility] but this ended up being canceled and scheduled appointment with cardiologist through [facility] who believes there is no benefit for stent placement and recommended continuation of medication management and monitoring.  Continues to take pravastatin without side effects myalgias.  Blood pressure today satisfactory 117/67.  Niece does state that current cardiologist has been managing antihypertensives and continues to fluctuate with SBP ranging from  1 10-1 60.  Denies new or worsening stroke/TIA symptoms.   Stroke admission 10/29/2017: [name] an 86 y.o.femalewith a PMH of Stroke, Recurrent Falls, Polycythemia Vera,Microalbuminuria, HTN, TIA, Endometrial Carcinoma, Diabetes Mellitus, Colon Cancer, Breast Cancer, Arthritis, and Adenocarcinoma in Situ of Cervixpresented to [facility] with black emesis and unresponsive 2/2 hypoxia to [facility] on 10/28/17. CTA Chest revealed pericardial effusion, small pleural effusions, and pulmonary edema. She was treated for sepsis secondary to pneumonia. She is on ASA and Plavix. She was noted to have NSTEMI and underwent cardiac cath which showed 3 vessel disease and severe LV dysfunction. Following cath procedure patient had a head CT which showedright cerebellar hemorrhagewith IVH.She was supposed to receive heparin drip, but was held. Patient transferred to [facility] hospital for further management.Hemorrhage was felt to be secondary to hypertensive etiology on dual antiplatelet therapy.  Neuro wise, she was at risk for cerebral edema and treated with 3%.  There was no neurosurgery intervention that would benefit her.  She continued to progress though still had issues with pulmonary edema and CHF.  Developed leukocytosis, etiology unknown, but resolved during admission.  Once stable, was transferred to the inpatient rehab for ongoing therapy.  Plans are for PCI with stent in the near future. Recommended cardiology to follow-up in 2 weeks. Recommended to resume aspirin at time of discharge from inpatient (7 days) and resume Plavix in 3 weeks.  12/17/17 visit JM: Patient is being seen today for hospital follow-up and is accompanied by her niece.  She continues to improve from a neurological standpoint but does have mild right-sided weakness along with dysarthria.  She continues to participate in home PT/OT/ST.  She does live independently but does have 24/7 care.  Continues to take aspirin 81 mg at this time  without bleeding or bruising.  They had not start Plavix as they state they need clearance from [place] first.  Continues to take pravastatin without myalgias.  Per niece, cardiologist states she needs to be on Plavix for 2 weeks prior to being considered for stent placement.  Blood pressure today satisfactory 124/69.  Patient denies new or worsening stroke/TIA symptoms.       ROS:   14 system review of systems performed and negative with exception of memory loss, joint pain, speech impairment  PMH:  Past Medical History:  Diagnosis Date  . Adenocarcinoma in situ of cervix   . Arthritis    hands  . Breast cancer ([place])   . Colon cancer ([place])   . Diabetes mellitus without complication ([place])   . Endometrial carcinoma ([facility])    s/p total abdominal hysterectomy  . H/O compression fracture of spine 2014   thoracic spine  . H/O polycythemia vera   . H/O TIA (transient ischemic attack) and stroke 09/2014, 03/2015   No deficits  . Hypertension   . Hyperuricemia   . Microalbuminuria   . Myocardial infarction ([place])   . Polycythemia vera ([place])   . Recurrent falls   . Skin cancer    face, legs  . Stroke [facility]) 2008   no deficits  . Trochanteric bursitis   . Varicose veins    treated    PSH:  Past Surgical History:  Procedure Laterality Date  . ABDOMINAL HYSTERECTOMY    . BOWEL RESECTION N/A 03/28/2015  Procedure: SMALL BOWEL RESECTION;  Surgeon: Leonie Green, MD;  Location: [facility] ORS;  Service: General;  Laterality: N/A;  . CATARACT EXTRACTION W/ INTRAOCULAR LENS IMPLANT Right   . CATARACT EXTRACTION W/PHACO Left 10/07/2015   Procedure: CATARACT EXTRACTION PHACO AND INTRAOCULAR LENS PLACEMENT (IOC);  Surgeon: Ronnell Freshwater, MD;  Location: [facility];  Service: Ophthalmology;  Laterality: Left;  DIABETIC - oral meds VISION BLUE  . CORONARY ANGIOGRAPHY N/A 10/29/2017   Procedure: CORONARY ANGIOGRAPHY;  Surgeon: Dionisio David, MD;  Location: [facility];   Service: Cardiovascular;  Laterality: N/A;  . EXPLORATORY LAPAROTOMY     for fibroids  . HIP ARTHROPLASTY Left 12/01/2018   Procedure: ARTHROPLASTY BIPOLAR HIP (HEMIARTHROPLASTY);  Surgeon: Corky Mull, MD;  Location: [facility] ORS;  Service: Orthopedics;  Laterality: Left;  . LEFT HEART CATH Right 10/29/2017   Procedure: Left Heart Cath;  Surgeon: Dionisio David, MD;  Location: [facility];  Service: Cardiovascular;  Laterality: Right;  . TONSILLECTOMY      Social History:  Social History   Socioeconomic History  . Marital status: Widowed    Spouse name: Not on file  . Number of children: Not on file  . Years of education: Not on file  . Highest education level: Not on file  Occupational History  . Not on file  Tobacco Use  . Smoking status: Former Research scientist (life sciences)  . Smokeless tobacco: Never Used  Substance and Sexual Activity  . Alcohol use: No  . Drug use: No  . Sexual activity: Not on file  Other Topics Concern  . Not on file  Social History Narrative  . Not on file   Social Determinants of Health   Financial Resource Strain:   . Difficulty of Paying Living Expenses:   Food Insecurity:   . Worried About Charity fundraiser in the Last Year:   . Arboriculturist in the Last Year:   Transportation Needs:   . Film/video editor (Medical):   Marland Kitchen Lack of Transportation (Non-Medical):   Physical Activity:   . Days of Exercise per Week:   . Minutes of Exercise per Session:   Stress:   . Feeling of Stress :   Social Connections:   . Frequency of Communication with Friends and Family:   . Frequency of Social Gatherings with Friends and Family:   . Attends Religious Services:   . Active Member of Clubs or Organizations:   . Attends Archivist Meetings:   Marland Kitchen Marital Status:   Intimate Partner Violence:   . Fear of Current or Ex-Partner:   . Emotionally Abused:   Marland Kitchen Physically Abused:   . Sexually Abused:     Family History:  Family History  Problem  Relation Age of Onset  . Cancer Brother        AML  . Heart attack Mother   . Breast cancer Mother   . Heart attack Father   . Diabetes Father   . Heart attack Sister   . Diabetes Sister     Medications:   Current Outpatient Medications on File Prior to Visit  Medication Sig Dispense Refill  . acetaminophen (TYLENOL) 325 MG tablet Take 1-2 tablets (325-650 mg total) by mouth every 6 (six) hours as needed for mild pain (pain score 1-3 or temp > 100.5). (Patient taking differently: Take 650 mg by mouth 3 (three) times daily. )    . amLODipine (NORVASC) 5 MG tablet Take  1 tablet (5 mg total) by mouth daily. 30 tablet 1  . calcium-vitamin D (OSCAL WITH D) 500-200 MG-UNIT per tablet Take 1 tablet by mouth daily.     . carvedilol (COREG) 25 MG tablet Take 1 tablet (25 mg total) by mouth 2 (two) times daily with a meal. (Patient taking differently: Take 12.5 mg by mouth 2 (two) times daily with a meal. ) 60 tablet 0  . clopidogrel (PLAVIX) 75 MG tablet Take 1 tablet (75 mg total) by mouth daily. 90 tablet 0  . fexofenadine (ALLEGRA ALLERGY) 180 MG tablet Take 1 tablet (180 mg total) by mouth daily.    . fluticasone (FLONASE) 50 MCG/ACT nasal spray Place 2 sprays into the nose daily.    . hydroxyurea (HYDREA) 500 MG capsule Take 1 capsule (500 mg total) by mouth daily. Take daily for six days then off one day and repeat weekly. May take with food to minimize GI side effects. 26 capsule 5  . insulin detemir (LEVEMIR) 100 unit/ml SOLN Inject 0.08 mLs (8 Units total) into the skin at bedtime. (Patient taking differently: Inject 10 Units into the skin at bedtime. ) 8 mL 1  . nitroGLYCERIN (NITRODUR - DOSED IN MG/24 HR) 0.1 mg/hr patch Place 0.1 mg onto the skin daily.     . pantoprazole (PROTONIX) 40 MG tablet Take 1 tablet (40 mg total) by mouth daily. 30 tablet 0  . pravastatin (PRAVACHOL) 20 MG tablet Take 1 tablet (20 mg total) by mouth at bedtime. 30 tablet 0  . QUEtiapine (SEROQUEL) 50 MG tablet  Take 50 mg by mouth at bedtime.     . ramipril (ALTACE) 5 MG capsule Take 5 mg by mouth 2 (two) times daily.    . sertraline (ZOLOFT) 50 MG tablet Take 1 tablet (50 mg total) by mouth daily. 30 tablet 4  . vitamin B-12 1000 MCG tablet Take 1 tablet (1,000 mcg total) by mouth daily. 30 tablet 1  . spironolactone (ALDACTONE) 25 MG tablet Take 0.5 tablets (12.5 mg total) by mouth every other day for 30 days. (Patient taking differently: Take 12.5 mg by mouth See admin instructions. Take 12.5 mg by mouth twice daily every other day) 7.5 tablet 0   No current facility-administered medications on file prior to visit.    Allergies:   Allergies  Allergen Reactions  . Other Other (See Comments)    ALL OPIOIDS   . Simvastatin Other (See Comments)    Myalgia     Vitals: Today's Vitals   10/04/19 1556  BP: 121/72  Pulse: (!) 58  Temp: 98.7 F (37.1 C)  Weight: 100 lb (45.4 kg)   Body mass index is 17.71 kg/m.   Physical Exam  General: Frail pleasant elderly Caucasian female, seated, in no evident distress Head: head normocephalic and atraumatic.   Neck: supple with no carotid or supraclavicular bruits Cardiovascular: regular rate and rhythm, no murmurs Musculoskeletal: no deformity Skin:  no rash/petichiae; skin tears Vascular:  Normal pulses all extremities   Neurologic Exam Mental Status: Awake and fully alert.   Mild to moderate dysarthria.  Oriented to place and time. Recent and remote memory diminished. Attention span, concentration and fund of knowledge appropriate during visit. Mood and affect appropriate.  Cranial Nerves: Fundoscopic exam reveals sharp disc margins. Pupils equal, briskly reactive to light. Extraocular movements full without nystagmus. Visual fields full to confrontation. Hearing intact. Facial sensation intact. Face, tongue, palate moves normally and symmetrically.  Motor: Normal bulk and tone. Normal  strength in all tested extremity muscles. Sensory.:  intact to touch , pinprick , position and vibratory sensation.  Coordination: Rapid alternating movements normal in all extremities. Finger-to-nose performed accurately bilaterally and heel-to-shin difficulty performing due to bilateral knee pain Gait and Station: Arises from chair without difficulty. Stance is hunched. Gait demonstrates normal short cautious steps with use of Rollator walker Reflexes: 1+ and symmetric. Toes downgoing.          ASSESSMENT: [name] is a 86 y.o. year old female here with right cerebellar parenchymal hemorrhage with IVH on 10/31/17 secondary to hypertensive plus DAPT. Vascular risk factors include HTN, DM, CAD, decreased EF and CHF.  Residual stroke deficits of dysarthria and cognitive impairment.     PLAN: -Continue Plavix 75 mg daily and pravastatin for secondary stroke prevention -Continuation of sertraline 50 mg daily and Seroquel 50 mg daily for benefit of likely underlying depression/anxiety and insomnia with hallucinations -ongoing monitoring management by PCP -Continue to follow with pain management for ongoing bilateral knee pain interfering with daily ambulation -Daughter reports potentially restarting home health PT and ST as she was recently advised she has additional visits until May -F/u with PCP regarding your HLD, HTN and DM management -Continue to follow with oncology for polycythemia -f/u with cardiologist as scheduled -Advised to continue to stay active along with doing home exercises and maintain a healthy diet -continue to monitor BP at home -Maintain strict control of hypertension with blood pressure goal below 130/90, diabetes with hemoglobin A1c goal below 6.5% and cholesterol with LDL cholesterol (bad cholesterol) goal below 70 mg/dL. I also advised the patient to eat a healthy diet with plenty of whole grains, cereals, fruits and vegetables, exercise regularly and maintain ideal body weight.   Follow up in 3 months or call  earlier if needed   I spent 35 minutes of face-to-face and non-face-to-face time with patient and niece.  This included previsit chart review, lab review, study review, order entry, electronic health record documentation, patient education regarding prior stroke, managing stroke risk factors, residual deficits, ongoing joint pain and answering all questions to patient satisfaction   Frann Rider, [facility]  [facility] [address] [place], [place] [address]  Phone [phone] Fax [phone]

## 2019-10-04 NOTE — Patient Instructions (Signed)
Continue clopidogrel 75 mg daily  and pravastatin  for secondary stroke prevention  Continue to follow up with PCP regarding cholesterol and blood pressure management   Restart therapies and continue to follow with pain management provider  Continue zoloft 50mg  daily and seroquel 50mg  daily  Continue to monitor blood pressure at home  Maintain strict control of hypertension with blood pressure goal below 130/90, diabetes with hemoglobin A1c goal below 6.5% and cholesterol with LDL cholesterol (bad cholesterol) goal below 70 mg/dL. I also advised the patient to eat a healthy diet with plenty of whole grains, cereals, fruits and vegetables, exercise regularly and maintain ideal body weight.  Followup in the future with me in 3 months or call earlier if needed       Thank you for coming to see [place] at [facility]. I hope we have been able to provide you high quality care today.  You may receive a patient satisfaction survey over the next few weeks. We would appreciate your feedback and comments so that we may continue to improve ourselves and the health of our patients.

## 2019-10-04 NOTE — Progress Notes (Signed)
I agree with the above plan 

## 2019-10-29 ENCOUNTER — Other Ambulatory Visit: Payer: Self-pay | Admitting: *Deleted

## 2019-10-29 DIAGNOSIS — C7A012 Malignant carcinoid tumor of the ileum: Secondary | ICD-10-CM

## 2019-10-30 ENCOUNTER — Inpatient Hospital Stay: Payer: Medicare PPO | Attending: Oncology

## 2019-10-30 ENCOUNTER — Other Ambulatory Visit: Payer: Self-pay

## 2019-10-30 ENCOUNTER — Encounter: Payer: Self-pay | Admitting: Oncology

## 2019-10-30 ENCOUNTER — Inpatient Hospital Stay (HOSPITAL_BASED_OUTPATIENT_CLINIC_OR_DEPARTMENT_OTHER): Payer: Medicare PPO | Admitting: Oncology

## 2019-10-30 ENCOUNTER — Inpatient Hospital Stay: Payer: Medicare PPO

## 2019-10-30 VITALS — BP 156/77 | HR 51 | Temp 96.7°F | Resp 16 | Wt 92.9 lb

## 2019-10-30 DIAGNOSIS — I252 Old myocardial infarction: Secondary | ICD-10-CM | POA: Diagnosis not present

## 2019-10-30 DIAGNOSIS — I10 Essential (primary) hypertension: Secondary | ICD-10-CM | POA: Insufficient documentation

## 2019-10-30 DIAGNOSIS — Z853 Personal history of malignant neoplasm of breast: Secondary | ICD-10-CM | POA: Insufficient documentation

## 2019-10-30 DIAGNOSIS — D61811 Other drug-induced pancytopenia: Secondary | ICD-10-CM | POA: Diagnosis not present

## 2019-10-30 DIAGNOSIS — Z803 Family history of malignant neoplasm of breast: Secondary | ICD-10-CM | POA: Diagnosis not present

## 2019-10-30 DIAGNOSIS — Z90710 Acquired absence of both cervix and uterus: Secondary | ICD-10-CM | POA: Insufficient documentation

## 2019-10-30 DIAGNOSIS — Z8542 Personal history of malignant neoplasm of other parts of uterus: Secondary | ICD-10-CM | POA: Insufficient documentation

## 2019-10-30 DIAGNOSIS — Z85828 Personal history of other malignant neoplasm of skin: Secondary | ICD-10-CM | POA: Diagnosis not present

## 2019-10-30 DIAGNOSIS — E119 Type 2 diabetes mellitus without complications: Secondary | ICD-10-CM | POA: Insufficient documentation

## 2019-10-30 DIAGNOSIS — Z833 Family history of diabetes mellitus: Secondary | ICD-10-CM | POA: Diagnosis not present

## 2019-10-30 DIAGNOSIS — D45 Polycythemia vera: Secondary | ICD-10-CM

## 2019-10-30 DIAGNOSIS — C7A012 Malignant carcinoid tumor of the ileum: Secondary | ICD-10-CM

## 2019-10-30 DIAGNOSIS — Z794 Long term (current) use of insulin: Secondary | ICD-10-CM | POA: Insufficient documentation

## 2019-10-30 DIAGNOSIS — Z8673 Personal history of transient ischemic attack (TIA), and cerebral infarction without residual deficits: Secondary | ICD-10-CM | POA: Diagnosis not present

## 2019-10-30 DIAGNOSIS — D3A029 Benign carcinoid tumor of the large intestine, unspecified portion: Secondary | ICD-10-CM | POA: Diagnosis not present

## 2019-10-30 DIAGNOSIS — Z8249 Family history of ischemic heart disease and other diseases of the circulatory system: Secondary | ICD-10-CM | POA: Diagnosis not present

## 2019-10-30 DIAGNOSIS — Z87891 Personal history of nicotine dependence: Secondary | ICD-10-CM | POA: Insufficient documentation

## 2019-10-30 DIAGNOSIS — Z79899 Other long term (current) drug therapy: Secondary | ICD-10-CM

## 2019-10-30 DIAGNOSIS — D473 Essential (hemorrhagic) thrombocythemia: Secondary | ICD-10-CM

## 2019-10-30 LAB — CBC WITH DIFFERENTIAL/PLATELET
Abs Immature Granulocytes: 0.01 10*3/uL (ref 0.00–0.07)
Basophils Absolute: 0 10*3/uL (ref 0.0–0.1)
Basophils Relative: 1 %
Eosinophils Absolute: 0 10*3/uL (ref 0.0–0.5)
Eosinophils Relative: 1 %
HCT: 31.7 % — ABNORMAL LOW (ref 36.0–46.0)
Hemoglobin: 10.6 g/dL — ABNORMAL LOW (ref 12.0–15.0)
Immature Granulocytes: 0 %
Lymphocytes Relative: 18 %
Lymphs Abs: 0.7 10*3/uL (ref 0.7–4.0)
MCH: 36.7 pg — ABNORMAL HIGH (ref 26.0–34.0)
MCHC: 33.4 g/dL (ref 30.0–36.0)
MCV: 109.7 fL — ABNORMAL HIGH (ref 80.0–100.0)
Monocytes Absolute: 0.2 10*3/uL (ref 0.1–1.0)
Monocytes Relative: 5 %
Neutro Abs: 2.8 10*3/uL (ref 1.7–7.7)
Neutrophils Relative %: 75 %
Platelets: 273 10*3/uL (ref 150–400)
RBC: 2.89 MIL/uL — ABNORMAL LOW (ref 3.87–5.11)
RDW: 22.8 % — ABNORMAL HIGH (ref 11.5–15.5)
WBC: 3.7 10*3/uL — ABNORMAL LOW (ref 4.0–10.5)
nRBC: 0 % (ref 0.0–0.2)

## 2019-10-30 LAB — COMPREHENSIVE METABOLIC PANEL
ALT: 18 U/L (ref 0–44)
AST: 15 U/L (ref 15–41)
Albumin: 3.9 g/dL (ref 3.5–5.0)
Alkaline Phosphatase: 36 U/L — ABNORMAL LOW (ref 38–126)
Anion gap: 10 (ref 5–15)
BUN: 27 mg/dL — ABNORMAL HIGH (ref 8–23)
CO2: 28 mmol/L (ref 22–32)
Calcium: 8.8 mg/dL — ABNORMAL LOW (ref 8.9–10.3)
Chloride: 98 mmol/L (ref 98–111)
Creatinine, Ser: 1.08 mg/dL — ABNORMAL HIGH (ref 0.44–1.00)
GFR calc Af Amer: 54 mL/min — ABNORMAL LOW (ref >60)
GFR calc non Af Amer: 47 mL/min — ABNORMAL LOW (ref >60)
Glucose, Bld: 146 mg/dL — ABNORMAL HIGH (ref 70–99)
Potassium: 4.2 mmol/L (ref 3.5–5.1)
Sodium: 136 mmol/L (ref 135–145)
Total Bilirubin: 1.1 mg/dL (ref 0.3–1.2)
Total Protein: 6.4 g/dL — ABNORMAL LOW (ref 6.5–8.1)

## 2019-10-30 MED ORDER — OCTREOTIDE ACETATE 30 MG IM KIT
30.0000 mg | PACK | INTRAMUSCULAR | Status: AC
  Administered 2019-10-30: 30 mg via INTRAMUSCULAR
  Filled 2019-10-30: qty 1

## 2019-10-30 NOTE — Progress Notes (Signed)
Pt fell at home today- her knees are bad and her walker not working well for her. She has staff to stay with her 24 hours a day but she gets up on her own and then falls and the staff comes. I have told pt that she has to call the person with her to come into her room before she gets up at all so she can be safe. She says she will.

## 2019-10-31 LAB — CHROMOGRANIN A: Chromogranin A (ng/mL): 199.7 ng/mL — ABNORMAL HIGH (ref 0.0–101.8)

## 2019-11-01 ENCOUNTER — Encounter: Payer: Self-pay | Admitting: Oncology

## 2019-11-01 ENCOUNTER — Other Ambulatory Visit: Payer: Self-pay | Admitting: *Deleted

## 2019-11-01 MED ORDER — HYDROXYUREA 500 MG PO CAPS
500.0000 mg | ORAL_CAPSULE | ORAL | 5 refills | Status: DC
Start: 2019-11-01 — End: 2019-11-09

## 2019-11-01 NOTE — Progress Notes (Signed)
Hematology/Oncology Consult note 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 Mike Gip for polycythemia vera/ET and she was on Hydrea for the same. Her last visit with her as an outpatient was in May 2020. She was taking hydroxyurea 500 mg 5 times a week. 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 Tumor was 2.5 cm with 9+ lymph nodes pathologic stage was T3N1. Her chromogranin levels since then have been monitored regularly and they have been chronically elevated between 200-2 50. Patient was noted to have liver lesions but did not have any significant metabolic uptake on PET CT scan in November 2019. At her last visit with Dr. Mike Gip in May 2020 plan was to get dotatate PET scan followed by consideration for liver biopsy and at that time patient was lost to follow-up   More recently patient was admitted to the hospital last week for generalized weakness and following a fall and was noted to have significant pancytopenia her hemoglobin had dropped down to 6.8 requiring a blood transfusion. Typically her platelet counts which are between 3 50-400 were down to 179. Also she was noted to have significant neutropenia with an ANC of 0.6 and a white cell count of 1.1. Hydroxyurea was kept on hold and patient had  extensive work-up including TSH B12 folate. B12 levels were low at 214. Reticulocyte count was low at 1.7% indicative of a hypoproliferative anemia. Chromogranin A levels were elevated at 440.   Interval history- patient is here with her caregiver today. She had a fall a day ago and scraped her forearm. This happended when she got out of her bed and tripped and slid down to the ground.   ECOG PS- 2-3 Pain scale- 0   Review of systems- Review of Systems  Constitutional: Positive for malaise/fatigue. Negative for chills, fever and weight loss.  HENT: Negative for congestion, ear discharge and nosebleeds.   Eyes: Negative for blurred vision.  Respiratory: Negative for cough, hemoptysis, sputum production, shortness of breath and wheezing.   Cardiovascular: Negative for chest pain, palpitations, orthopnea and claudication.  Gastrointestinal: Negative for abdominal pain, blood in stool, constipation, diarrhea, heartburn, melena, nausea and vomiting.  Genitourinary: Negative for dysuria, flank pain, frequency, hematuria and urgency.  Musculoskeletal: Positive for falls. Negative for back pain, joint pain and myalgias.  Skin: Negative for rash.  Neurological: Negative for dizziness, tingling, focal weakness, seizures, weakness and headaches.  Endo/Heme/Allergies: Does not bruise/bleed easily.  Psychiatric/Behavioral: Negative for depression and suicidal ideas. The patient does not have insomnia.       Allergies  Allergen Reactions  . Other Other (See Comments)    ALL OPIOIDS   . Simvastatin Other (See Comments)    Myalgia     Past Medical History:  Diagnosis Date  . Adenocarcinoma in situ of cervix   . Arthritis    hands  . Breast cancer (  [place])   . Colon cancer ([place])   . Diabetes mellitus without complication ([place])   . Endometrial carcinoma (HCC)    s/p total abdominal hysterectomy  . H/O compression fracture of spine 2014   thoracic spine  . H/O polycythemia vera   . H/O  TIA (transient ischemic attack) and stroke 09/2014, 03/2015   No deficits  . Hypertension   . Hyperuricemia   . Microalbuminuria   . Myocardial infarction ([place])   . Polycythemia vera ([place])   . Recurrent falls   . Skin cancer    face, legs  . Stroke [facility]) 2008   no deficits  . Trochanteric bursitis   . Varicose veins    treated     Past Surgical History:  Procedure Laterality Date  . ABDOMINAL HYSTERECTOMY    . BOWEL RESECTION N/A 03/28/2015   Procedure: SMALL BOWEL RESECTION;  Surgeon: Leonie Green, MD;  Location: [facility];  Service: General;  Laterality: N/A;  . CATARACT EXTRACTION W/ INTRAOCULAR LENS IMPLANT Right   . CATARACT EXTRACTION W/PHACO Left 10/07/2015   Procedure: CATARACT EXTRACTION PHACO AND INTRAOCULAR LENS PLACEMENT (IOC);  Surgeon: Ronnell Freshwater, MD;  Location: [place];  Service: Ophthalmology;  Laterality: Left;  DIABETIC - oral meds VISION BLUE  . CORONARY ANGIOGRAPHY N/A 10/29/2017   Procedure: CORONARY ANGIOGRAPHY;  Surgeon: Dionisio David, MD;  Location: [place] [facility];  Service: Cardiovascular;  Laterality: N/A;  . EXPLORATORY LAPAROTOMY     for fibroids  . HIP ARTHROPLASTY Left 12/01/2018   Procedure: ARTHROPLASTY BIPOLAR HIP (HEMIARTHROPLASTY);  Surgeon: Corky Mull, MD;  Location: [facility];  Service: Orthopedics;  Laterality: Left;  . LEFT HEART CATH Right 10/29/2017   Procedure: Left Heart Cath;  Surgeon: Dionisio David, MD;  Location: [place] [facility];  Service: Cardiovascular;  Laterality: Right;  . TONSILLECTOMY      Social History   Socioeconomic History  . Marital status: Widowed    Spouse name: Not on file  . Number of children: Not on file  . Years of education: Not on file  . Highest education level: Not on file  Occupational History  . Not on file  Tobacco Use  . Smoking status: Former Research scientist (life sciences)  . Smokeless tobacco: Never Used  Substance and Sexual Activity  . Alcohol use: No  . Drug use: No   . Sexual activity: Not Currently  Other Topics Concern  . Not on file  Social History Narrative  . Not on file   Social Determinants of Health   Financial Resource Strain:   . Difficulty of Paying Living Expenses:   Food Insecurity:   . Worried About Charity fundraiser in the Last Year:   . Arboriculturist in the Last Year:   Transportation Needs:   . Film/video editor (Medical):   Marland Kitchen Lack of Transportation (Non-Medical):   Physical Activity:   . Days of Exercise per Week:   . Minutes of Exercise per Session:   Stress:   . Feeling of Stress :   Social Connections:   . Frequency of Communication with Friends and Family:   . Frequency of Social Gatherings with Friends and Family:   . Attends Religious Services:   . Active Member of Clubs or Organizations:   . Attends Archivist Meetings:   Marland Kitchen Marital Status:   Intimate Partner Violence:   . Fear of Current or Ex-Partner:   . Emotionally Abused:   .  Physically Abused:   . Sexually Abused:     Family History  Problem Relation Age of Onset  . Cancer Brother        AML  . Heart attack Mother   . Breast cancer Mother   . Heart attack Father   . Diabetes Father   . Heart attack Sister   . Diabetes Sister      Current Outpatient Medications:  .  acetaminophen (TYLENOL) 325 MG tablet, Take 1-2 tablets (325-650 mg total) by mouth every 6 (six) hours as needed for mild pain (pain score 1-3 or temp > 100.5). (Patient taking differently: Take 650 mg by mouth 3 (three) times daily. ), Disp:  , Rfl:  .  amLODipine (NORVASC) 5 MG tablet, Take 1 tablet (5 mg total) by mouth daily., Disp: 30 tablet, Rfl: 1 .  calcium-vitamin D (OSCAL WITH D) 500-200 MG-UNIT per tablet, Take 1 tablet by mouth daily. , Disp: , Rfl:  .  carvedilol (COREG) 25 MG tablet, Take 1 tablet (25 mg total) by mouth 2 (two) times daily with a meal. (Patient taking differently: Take 12.5 mg by mouth 2 (two) times daily with a meal. ), Disp: 60  tablet, Rfl: 0 .  clopidogrel (PLAVIX) 75 MG tablet, Take 1 tablet (75 mg total) by mouth daily., Disp: 90 tablet, Rfl: 0 .  fexofenadine (ALLEGRA ALLERGY) 180 MG tablet, Take 1 tablet (180 mg total) by mouth daily., Disp: , Rfl:  .  fluticasone (FLONASE) 50 MCG/ACT nasal spray, Place 2 sprays into the nose daily., Disp: , Rfl:  .  hydroxyurea (HYDREA) 500 MG capsule, Take 1 capsule (500 mg total) by mouth daily. Take daily for six days then off one day and repeat weekly. May take with food to minimize GI side effects., Disp: 26 capsule, Rfl: 5 .  insulin detemir (LEVEMIR) 100 unit/ml SOLN, Inject 0.08 mLs (8 Units total) into the skin at bedtime. (Patient taking differently: Inject 10 Units into the skin at bedtime. ), Disp: 8 mL, Rfl: 1 .  nitroGLYCERIN (NITRODUR - DOSED IN MG/24 HR) 0.1 mg/hr patch, Place 0.1 mg onto the skin daily. , Disp: , Rfl:  .  pantoprazole (PROTONIX) 40 MG tablet, Take 1 tablet (40 mg total) by mouth daily., Disp: 30 tablet, Rfl: 0 .  pravastatin (PRAVACHOL) 20 MG tablet, Take 1 tablet (20 mg total) by mouth at bedtime., Disp: 30 tablet, Rfl: 0 .  QUEtiapine (SEROQUEL) 50 MG tablet, Take 50 mg by mouth at bedtime. , Disp: , Rfl:  .  ramipril (ALTACE) 5 MG capsule, Take 5 mg by mouth 2 (two) times daily., Disp: , Rfl:  .  sertraline (ZOLOFT) 50 MG tablet, Take 1 tablet (50 mg total) by mouth daily., Disp: 30 tablet, Rfl: 4 .  spironolactone (ALDACTONE) 25 MG tablet, Take 0.5 tablets (12.5 mg total) by mouth every other day for 30 days. (Patient taking differently: Take 12.5 mg by mouth See admin instructions. Take 12.5 mg by mouth twice daily every other day), Disp: 7.5 tablet, Rfl: 0 .  vitamin B-12 1000 MCG tablet, Take 1 tablet (1,000 mcg total) by mouth daily., Disp: 30 tablet, Rfl: 1 No current facility-administered medications for this visit.  Facility-Administered Medications Ordered in Other Visits:  .  octreotide (SANDOSTATIN LAR) IM injection 30 mg, 30 mg,  Intramuscular, Q30 days, Sindy Guadeloupe, MD, 30 mg at 10/30/19 1134  Physical exam:  Vitals:   10/30/19 1005  BP: (!) 156/77  Pulse: (!) 51  Resp: 16  Temp: (!) 96.7 F (35.9 C)  TempSrc: Tympanic  Weight: 92 lb 14.4 oz (42.1 kg)   Physical Exam Constitutional:      Comments: Thin elderly frail woman sitting in a hweelchair.  Cardiovascular:     Rate and Rhythm: Normal rate and regular rhythm.     Heart sounds: Normal heart sounds.  Pulmonary:     Effort: Pulmonary effort is normal.     Breath sounds: Normal breath sounds.  Abdominal:     General: Bowel sounds are normal.     Palpations: Abdomen is soft.  Musculoskeletal:     Comments: Dressing in place over b/l forearms  Skin:    General: Skin is warm and dry.  Neurological:     Mental Status: She is oriented to person, place, and time.      CMP Latest Ref Rng & Units 10/30/2019  Glucose 70 - 99 mg/dL 146(H)  BUN 8 - 23 mg/dL 27(H)  Creatinine 0.44 - 1.00 mg/dL 1.08(H)  Sodium 135 - 145 mmol/L 136  Potassium 3.5 - 5.1 mmol/L 4.2  Chloride 98 - 111 mmol/L 98  CO2 22 - 32 mmol/L 28  Calcium 8.9 - 10.3 mg/dL 8.8(L)  Total Protein 6.5 - 8.1 g/dL 6.4(L)  Total Bilirubin 0.3 - 1.2 mg/dL 1.1  Alkaline Phos 38 - 126 U/L 36(L)  AST 15 - 41 U/L 15  ALT 0 - 44 U/L 18   CBC Latest Ref Rng & Units 10/30/2019  WBC 4.0 - 10.5 K/uL 3.7(L)  Hemoglobin 12.0 - 15.0 g/dL 10.6(L)  Hematocrit 36.0 - 46.0 % 31.7(L)  Platelets 150 - 400 K/uL 273     Assessment and plan- Patient is a 86 y.o. female who is here for following issues:  1. JAK2 positive ET-3 months CBC showed elevated platelets 750 on 08/28/2019.  At that time hemoglobin was 11.6 and white count of 12.1.  Today her white count is down to 3.7, hemoglobin 10.6 down to 273.  She is having myelosuppression from hydroxyurea 500 mg 6 times a week.  When she was on 3 times a week the dose is not enough to bring her platelet counts down.  I have therefore asked her to take Hydrea  5 times a week from Monday to Friday    2.  Metastatic carcinoid: Continue monthly Sandostatin.  Chromogranin A levels are coming down.  Will consider scans after 3 to 4 months  CBC with differential and CMP in 1 month and 2 months and I will see her back in 2 months.  She will Sandostatin each month. Visit Diagnosis 1. High risk medication use   2. Drug-induced pancytopenia (HCC)   3. Carcinoid tumor of colon   4. Essential thrombocytosis ([place])      Dr. Randa Evens, MD, MPH [facility] [id number] 11/01/2019 9:53 AM

## 2019-11-02 ENCOUNTER — Other Ambulatory Visit: Payer: Self-pay | Admitting: Adult Health

## 2019-11-08 ENCOUNTER — Other Ambulatory Visit: Payer: Self-pay | Admitting: Oncology

## 2019-11-08 NOTE — Telephone Encounter (Signed)
Contains abnormal data CBC with Differential/Platelet Order: [id number] Status:  Final result  Visible to patient:  Yes (MyChart)  Next appt:  11/30/2019 at 10:00 AM in Oncology ([facility])  Dx:  Essential thrombocytosis ([place]); Malig...  Ref Range & Units 9 d ago  WBC 4.0 - 10.5 K/uL 3.7Low    RBC 3.87 - 5.11 MIL/uL 2.89Low    Hemoglobin 12.0 - 15.0 g/dL 10.6Low    HCT 36.0 - 46.0 % 31.7Low    MCV 80.0 - 100.0 fL 109.7High    MCH 26.0 - 34.0 pg 36.7High    MCHC 30.0 - 36.0 g/dL 33.4   RDW 11.5 - 15.5 % 22.8High    Platelets 150 - 400 K/uL 273   nRBC 0.0 - 0.2 % 0.0   Neutrophils Relative % % 75   Neutro Abs 1.7 - 7.7 K/uL 2.8   Lymphocytes Relative % 18   Lymphs Abs 0.7 - 4.0 K/uL 0.7   Monocytes Relative % 5   Monocytes Absolute 0.1 - 1.0 K/uL 0.2   Eosinophils Relative % 1   Eosinophils Absolute 0.0 - 0.5 K/uL 0.0   Basophils Relative % 1   Basophils Absolute 0.0 - 0.1 K/uL 0.0   Immature Granulocytes % 0   Abs Immature Granulocytes 0.00 - 0.07 K/uL 0.01   Comment: Performed at [facility], [address] [place], [place] [address]  Resulting Agency  [facility] LAB      Specimen Collected: 10/30/19 08:58 Last Resulted: 10/30/19 09:26     Lab Flowsheet   Order Details   View Encounter   Lab and Collection Details   Routing   Result History         Other Results from 10/30/2019  Contains abnormal data Chromogranin A  Status:  Final result  Visible to patient:  Yes (MyChart)  Next appt:  11/30/2019 at 10:00 AM in Oncology ([facility])  Dx:  Malignant carcinoid tumor of ileum ([place]) Order: [id number]  Ref Range & Units 9 d ago  Chromogranin A (ng/mL) 0.0 - 101.8 ng/mL 199.7High    Comment: (NOTE)  Results of this test are labeled for research purposes only by the  assay's manufacturer. The performance characteristics of this assay  have not been established by the manufacturer. The result should  not be used for treatment or for diagnostic purposes  without  confirmation of the diagnosis by another medically established  diagnostic product or procedure. The performance characteristics were  determined by [facility].  Chromogranin A performed by Thermofisher/BRAHMS KRYPTOR methodology  Values obtained with different assay methods or kits cannot be used  interchangeably.  Performed At: [facility]  [place], [place] [id number]  Rush Farmer MD [id number]   Resulting Agency  [facility] LAB      Specimen Collected: 10/30/19 08:58 Last Resulted: 10/31/19 15:36     Lab Flowsheet   Order Details   View Encounter   Lab and Collection Details   Routing   Result History           Contains abnormal data Comprehensive metabolic panel  Status:  Final result  Visible to patient:  Yes (MyChart)  Next appt:  11/30/2019 at 10:00 AM in Oncology ([facility])  Dx:  Essential thrombocytosis ([place]); Malig... Order: [id number]  Ref Range & Units 9 d ago  Sodium 135 - 145 mmol/L 136   Potassium 3.5 - 5.1 mmol/L 4.2   Chloride 98 - 111 mmol/L 98   CO2 22 -  32 mmol/L 28   Glucose, Bld 70 - 99 mg/dL 146High    Comment: Glucose reference range applies only to samples taken after fasting for at least 8 hours.  BUN 8 - 23 mg/dL 27High    Creatinine, Ser 0.44 - 1.00 mg/dL 1.08High    Calcium 8.9 - 10.3 mg/dL 8.8Low    Total Protein 6.5 - 8.1 g/dL 6.4Low    Albumin 3.5 - 5.0 g/dL 3.9   AST 15 - 41 U/L 15   ALT 0 - 44 U/L 18   Alkaline Phosphatase 38 - 126 U/L 36Low    Total Bilirubin 0.3 - 1.2 mg/dL 1.1   GFR calc non Af Amer >60 mL/min 47Low    GFR calc Af Amer >60 mL/min 54Low    Anion gap 5 - 15 10   Comment: Performed at [facility], [address] [place], [place] [address]  Resulting Agency  [facility] CL[facility] LAB      Specimen Collected: 10/30/19 08:58 Last Resulted: 10/30/19 09:

## 2019-11-17 ENCOUNTER — Telehealth: Payer: Self-pay

## 2019-11-17 NOTE — Telephone Encounter (Signed)
Phone call placed to patient. Spoke with patient's niece offering to schedule a follow up visit with Hanley Ben NP. Scheduled for 11/24/2019 @ 1:30pm.

## 2019-11-20 IMAGING — CT CT CERVICAL SPINE WITHOUT CONTRAST
4 of 7 series · 13 of 33 positions shown, 15 images · non-contrast
Comparison: CT head 10/31/2017

CLINICAL DATA: Fall.  Injury.

EXAM:
CT HEAD WITHOUT CONTRAST
CT CERVICAL SPINE WITHOUT CONTRAST
TECHNIQUE: Multidetector CT imaging of the head and cervical spine was
performed following the standard protocol without intravenous
contrast. Multiplanar CT image reconstructions of the cervical spine
were also generated.

[Series 7: c spine soft · axial · 0.34mm/px · z∈[-282,-248]mm · 2 of 70 slices shown]
[im 18/70  soft-tissue]
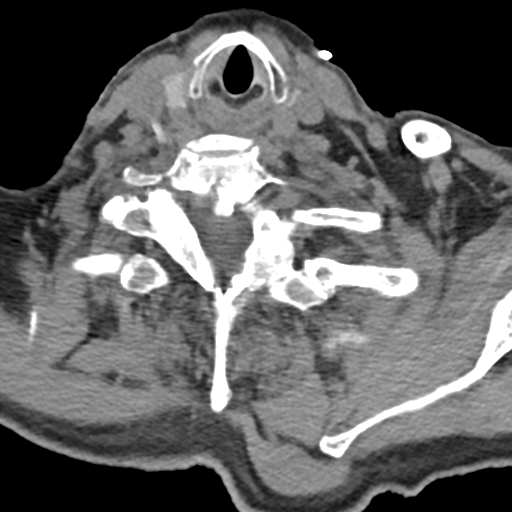
[im 35/70  soft-tissue]
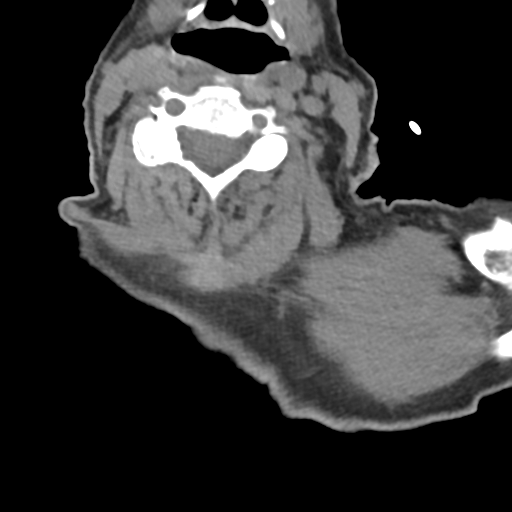

[Series 10: sagittal bone · sagittal · 0.26mm/px · 4 of 49 slices shown]
[im 10/49  bone]
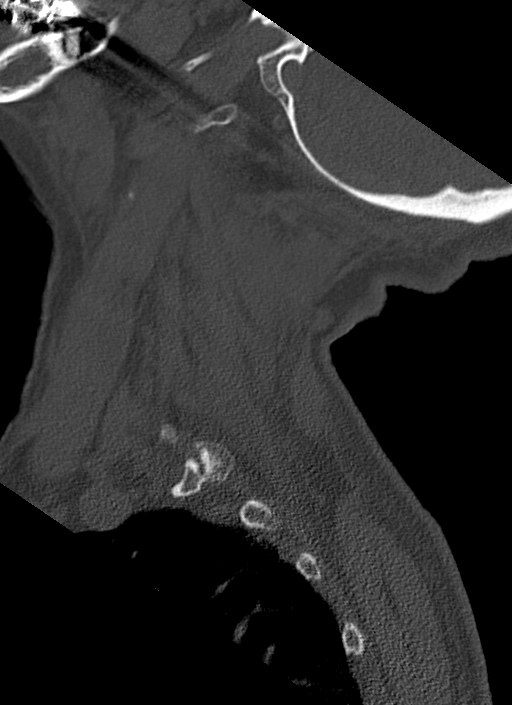
[im 20/49  bone]
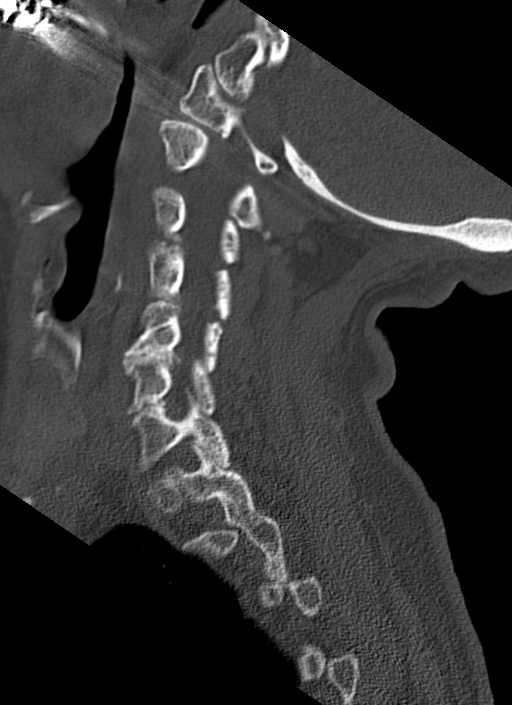
[im 29/49  bone]
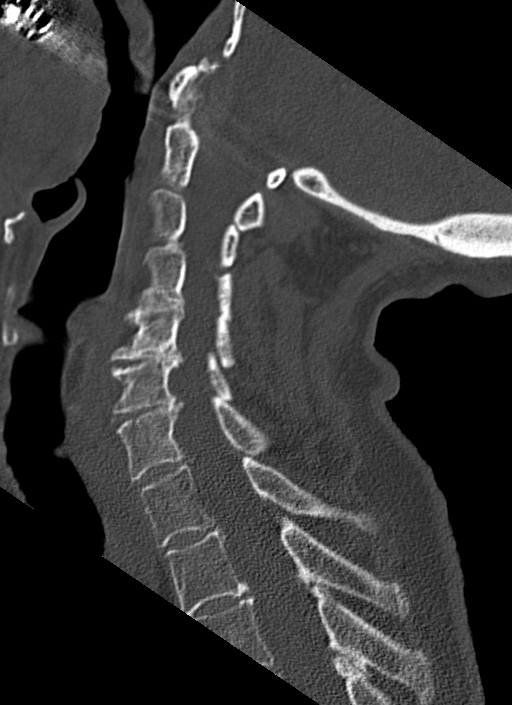
[im 39/49  bone]
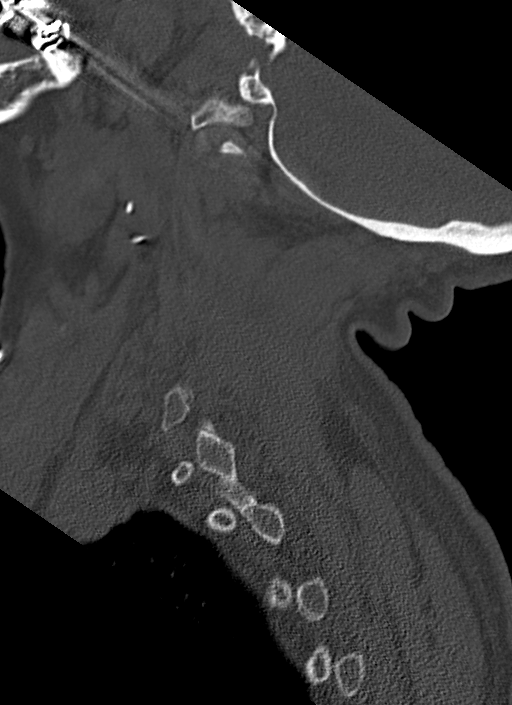

[Series 11: coronal bone · coronal · 0.25mm/px · 2 of 59 slices shown]
[im 20/59  bone]
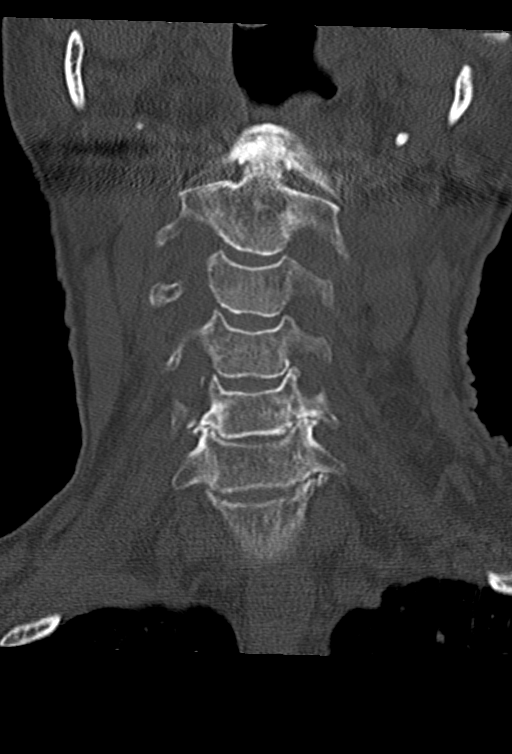
[im 39/59  bone]
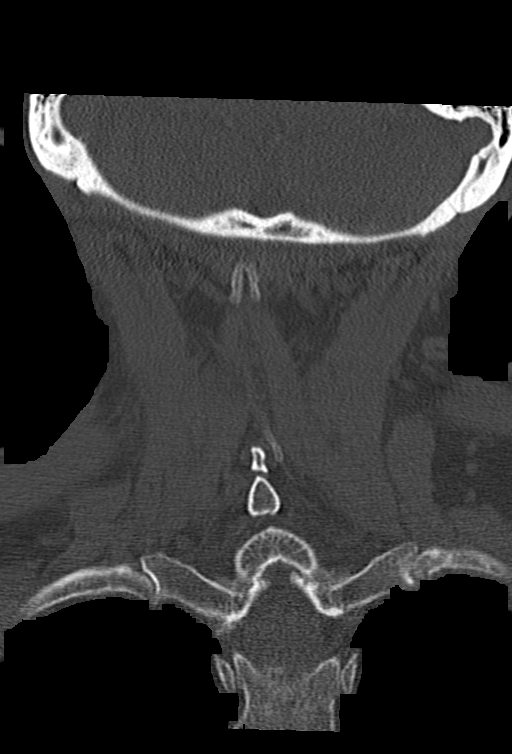

[Series 12: orthogonal bone · axial · 0.22mm/px · z∈[-331,-223]mm · 5 of 90 slices shown, 7 images]
[im 15/90  soft-tissue]
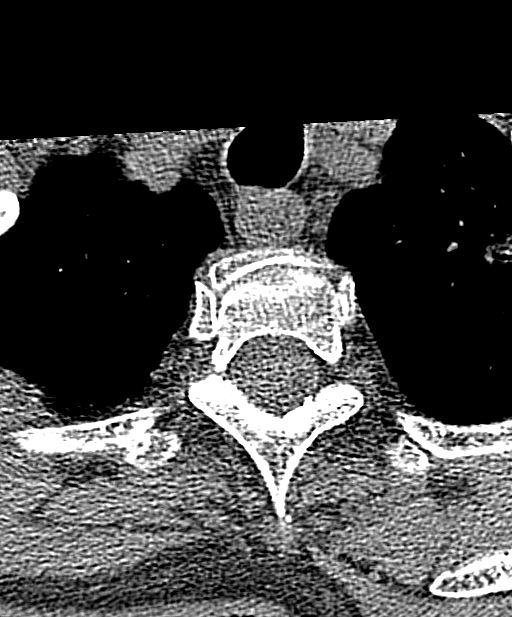
[im 15/90  bone]
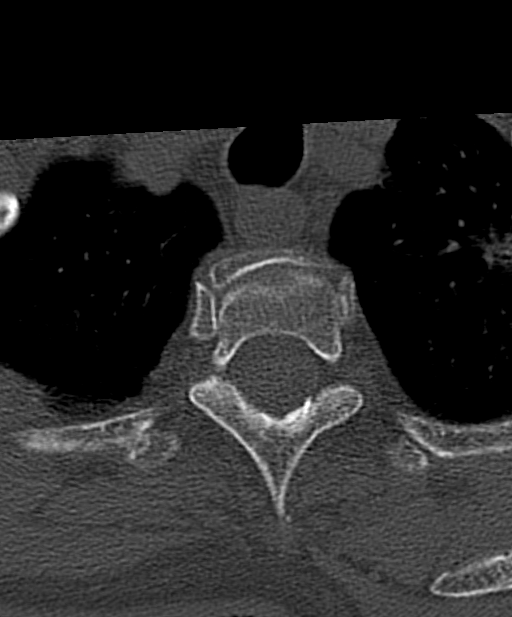
[im 30/90  bone]
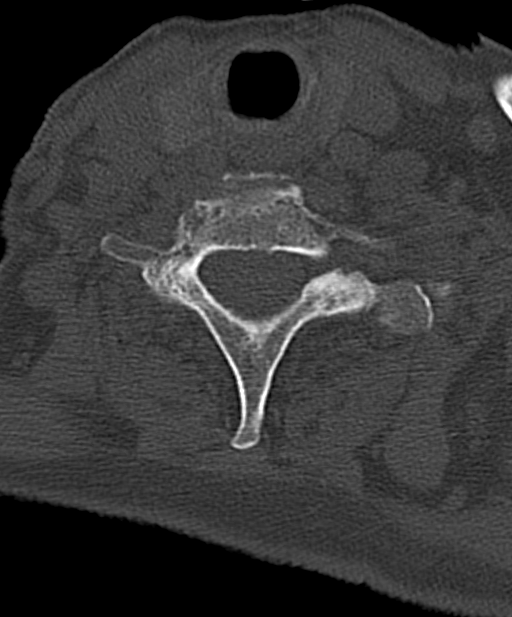
[im 45/90  bone]
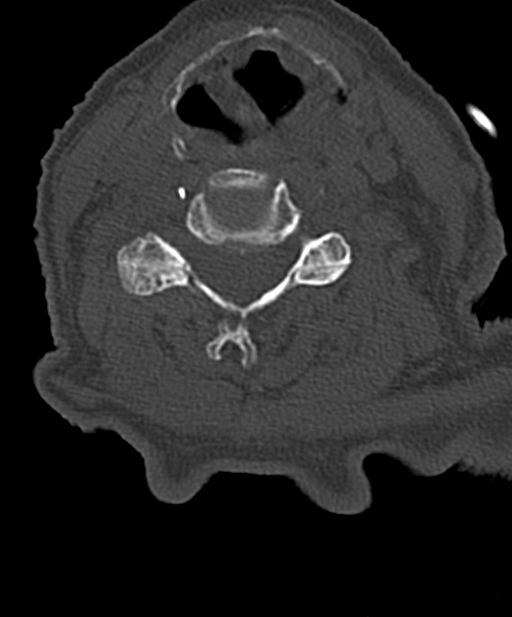
[im 60/90  bone]
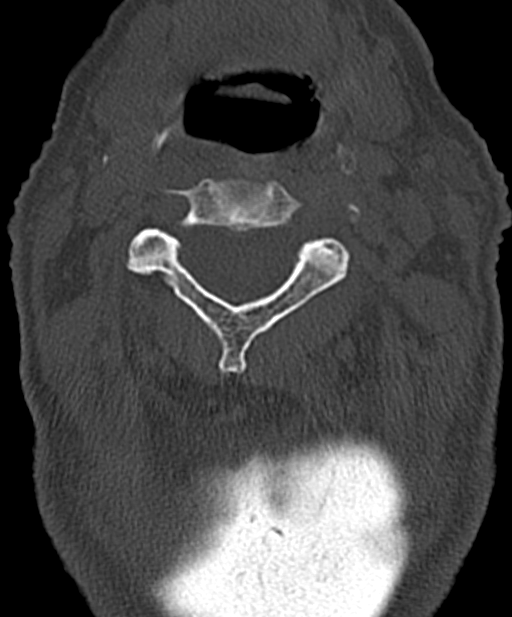
[im 75/90  soft-tissue]
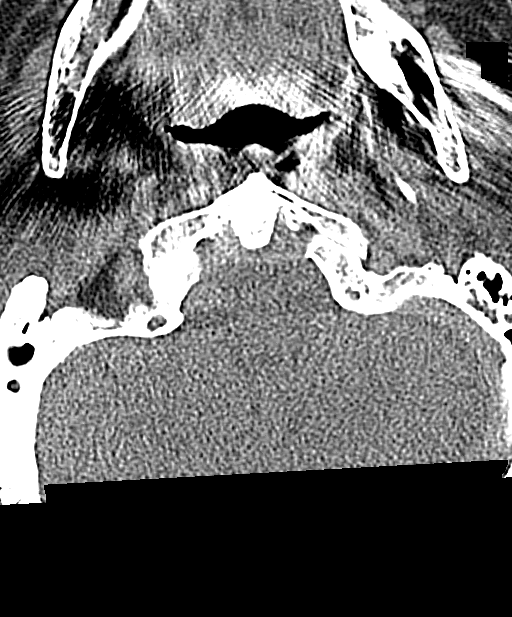
[im 75/90  bone]
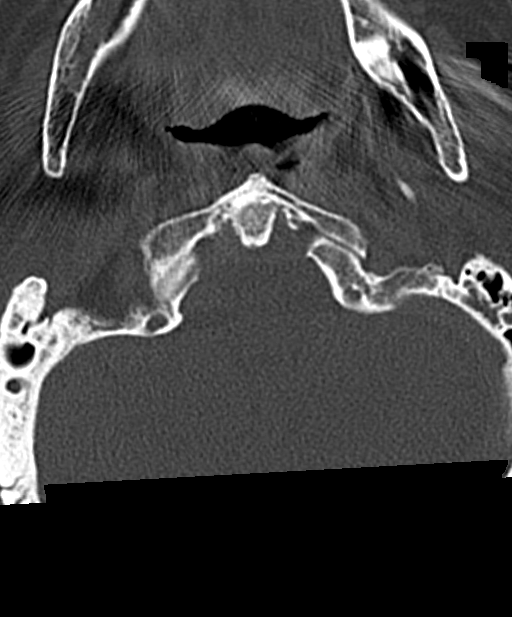

[13 of 33 positions shown; findings below may reference images not displayed]

FINDINGS: CT HEAD FINDINGS

Brain: Moderate atrophy and moderate microvascular ischemic changes
in the white matter. Negative for acute hemorrhage or infarct. No
mass or edema

Vascular: Negative for hyperdense vessel.

Skull: Negative for fracture

Sinuses/Orbits: Mucosal edema paranasal sinuses. Bilateral cataract
surgery. No orbital mass.

Other: None

CT CERVICAL SPINE FINDINGS

Alignment: Mild anterolisthesis C7-T1.

Skull base and vertebrae: Negative for fracture

Soft tissues and spinal canal: Negative for mass or soft tissue
swelling

Disc levels: Multilevel disc and facet degeneration throughout the
cervical spine. Spurring most prominent at C5-6 causing spinal
stenosis and foraminal stenosis bilaterally.

Upper chest: 8 mm sub solid nodule left upper lobe unchanged from
prior studies

Other: None
IMPRESSION: 1. No acute intracranial abnormality. Atrophy and chronic
microvascular ischemia
2. Negative for cervical spine fracture. Moderate cervical spine
degenerative changes.

## 2019-11-24 ENCOUNTER — Other Ambulatory Visit: Payer: Medicare PPO | Admitting: Adult Health Nurse Practitioner

## 2019-11-24 ENCOUNTER — Other Ambulatory Visit: Payer: Self-pay

## 2019-11-24 DIAGNOSIS — D45 Polycythemia vera: Secondary | ICD-10-CM

## 2019-11-24 DIAGNOSIS — Z515 Encounter for palliative care: Secondary | ICD-10-CM

## 2019-11-24 NOTE — Progress Notes (Signed)
[place] Consult Note Telephone: [phone]  Fax: [phone]  PATIENT NAME: [name] DOB: 1932/05/22 MRN: [id number]  PRIMARY CARE PROVIDER:   Baxter Hire, MD  REFERRING PROVIDER:  Billey Chang NP  RESPONSIBLE PARTY:     Ree Edman, niece [phone] Patient's home phone [phone]       RECOMMENDATIONS and PLAN:  1.  Advanced care planning. Patient listed as DNR/DNI.  Left VM with niece to update on visit.  Will call back in 4 weeks to set up next visit.  2.  Polycythemia.  She is being seen by Dr. Janese Banks with hematology/oncology. She had her hydroxyurea increased to 5 days a week.  3days a week was not enough to keep her platelet levels down and 6 days a week caused myelosuppression.  Patient and her caregiver deny increased SOB, N/V/D, constipation, chest pain, palpitations.   Patient is able to ambulate with a walker. She continues to get PT through home health through [facility]. Requires assistance with ADLs.  Her appetite is the same and weight staying around 96 pounds.  Continue follow up and recommendations by Dr. Janese Banks.   3.  Skin tear.  Skin tears from fall in April are healed except one on left forearm.  This is being cared for by [facility].  Continue wound care by [facility] RN.    4.  Hallucinations. Caregiver states that she is sleeping well and not getting up as much at night. Hallucinations have lessened. Continue current dose of seroquel   4.  Pain.  Lidocaine cream and voltaren are effective for her OA pain in knees.  She does use Tylenol and tramadol as needed with relief.  Is being followed by pain clinic.  Continue follow up and recommendations by pain clinic  Palliative will continue to monitor for symptom management/decline and make recommendations as needed.  I spent 60 minutes providing this consultation,  from 1:00 to 2:00 including time with patient/family, chart review, provider  coordination, and documentation. More than 50% of the time in this consultation was spent coordinating communication.   HISTORY OF PRESENT ILLNESS:  [name] is a 86 y.o. year old female with multiple medical problems including polycythemia, DMT2, DDD, h/o TIAs, h/o endometrial carcinoma. Palliative Care was asked to help address goals of care.   CODE STATUS: DNR/DNI  PPS: 50% HOSPICE ELIGIBILITY/DIAGNOSIS: TBD  PHYSICAL EXAM:  BP 110/62  HR  71  O2 96% on RA General: NAD, frail appearing, thin Cardiovascular: regular rate and rhythm Pulmonary: lung sounds clear; normal respiratory effort Abdomen: soft, nontender, + bowel sounds GU: no suprapubic tenderness Extremities: no edema, no joint deformities Skin: skin tear to left forearm being followed by Home Health RN Neurological: Weakness; A&O to  Person and place  PAST MEDICAL HISTORY:  Past Medical History:  Diagnosis Date  . Adenocarcinoma in situ of cervix   . Arthritis    hands  . Breast cancer ([place])   . Colon cancer ([place])   . Diabetes mellitus without complication ([place])   . Endometrial carcinoma ([facility])    s/p total abdominal hysterectomy  . H/O compression fracture of spine 2014   thoracic spine  . H/O polycythemia vera   . H/O TIA (transient ischemic attack) and stroke 09/2014, 03/2015   No deficits  . Hypertension   . Hyperuricemia   . Microalbuminuria   . Myocardial infarction ([place])   . Polycythemia vera ([place])   . Recurrent  falls   . Skin cancer    face, legs  . Stroke [facility]) 2008   no deficits  . Trochanteric bursitis   . Varicose veins    treated    SOCIAL HX:  Social History   Tobacco Use  . Smoking status: Former Research scientist (life sciences)  . Smokeless tobacco: Never Used  Substance Use Topics  . Alcohol use: No    ALLERGIES:  Allergies  Allergen Reactions  . Other Other (See Comments)    ALL OPIOIDS   . Simvastatin Other (See Comments)    Myalgia     PERTINENT MEDICATIONS:  Outpatient Encounter  Medications as of 11/24/2019  Medication Sig  . acetaminophen (TYLENOL) 325 MG tablet Take 1-2 tablets (325-650 mg total) by mouth every 6 (six) hours as needed for mild pain (pain score 1-3 or temp > 100.5). (Patient taking differently: Take 650 mg by mouth 3 (three) times daily. )  . amLODipine (NORVASC) 5 MG tablet Take 1 tablet (5 mg total) by mouth daily.  . calcium-vitamin D (OSCAL WITH D) 500-200 MG-UNIT per tablet Take 1 tablet by mouth daily.   . carvedilol (COREG) 25 MG tablet Take 1 tablet (25 mg total) by mouth 2 (two) times daily with a meal. (Patient taking differently: Take 12.5 mg by mouth 2 (two) times daily with a meal. )  . clopidogrel (PLAVIX) 75 MG tablet TAKE 1 TABLET BY MOUTH DAILY  . fexofenadine (ALLEGRA ALLERGY) 180 MG tablet Take 1 tablet (180 mg total) by mouth daily.  . fluticasone (FLONASE) 50 MCG/ACT nasal spray Place 2 sprays into the nose daily.  . hydroxyurea (HYDREA) 500 MG capsule Take 1 capsule (500 mg total) by mouth daily.  . insulin detemir (LEVEMIR) 100 unit/ml SOLN Inject 0.08 mLs (8 Units total) into the skin at bedtime. (Patient taking differently: Inject 10 Units into the skin at bedtime. )  . nitroGLYCERIN (NITRODUR - DOSED IN MG/24 HR) 0.1 mg/hr patch Place 0.1 mg onto the skin daily.   . pantoprazole (PROTONIX) 40 MG tablet Take 1 tablet (40 mg total) by mouth daily.  . pravastatin (PRAVACHOL) 20 MG tablet Take 1 tablet (20 mg total) by mouth at bedtime.  Marland Kitchen QUEtiapine (SEROQUEL) 50 MG tablet Take 50 mg by mouth at bedtime.   . ramipril (ALTACE) 5 MG capsule Take 5 mg by mouth 2 (two) times daily.  . sertraline (ZOLOFT) 50 MG tablet Take 1 tablet (50 mg total) by mouth daily.  Marland Kitchen spironolactone (ALDACTONE) 25 MG tablet Take 0.5 tablets (12.5 mg total) by mouth every other day for 30 days. (Patient taking differently: Take 12.5 mg by mouth See admin instructions. Take 12.5 mg by mouth twice daily every other day)  . vitamin B-12 1000 MCG tablet Take 1  tablet (1,000 mcg total) by mouth daily.   Facility-Administered Encounter Medications as of 11/24/2019  Medication  . octreotide (SANDOSTATIN LAR) IM injection 30 mg      April Sages, NP

## 2019-11-30 ENCOUNTER — Other Ambulatory Visit: Payer: Self-pay

## 2019-11-30 ENCOUNTER — Inpatient Hospital Stay: Payer: Medicare PPO

## 2019-11-30 ENCOUNTER — Inpatient Hospital Stay: Payer: Medicare PPO | Attending: Oncology

## 2019-11-30 DIAGNOSIS — C7A012 Malignant carcinoid tumor of the ileum: Secondary | ICD-10-CM | POA: Diagnosis not present

## 2019-11-30 DIAGNOSIS — Z79899 Other long term (current) drug therapy: Secondary | ICD-10-CM

## 2019-11-30 DIAGNOSIS — D473 Essential (hemorrhagic) thrombocythemia: Secondary | ICD-10-CM | POA: Diagnosis present

## 2019-11-30 DIAGNOSIS — D45 Polycythemia vera: Secondary | ICD-10-CM

## 2019-11-30 LAB — COMPREHENSIVE METABOLIC PANEL
ALT: 13 U/L (ref 0–44)
AST: 14 U/L — ABNORMAL LOW (ref 15–41)
Albumin: 3.7 g/dL (ref 3.5–5.0)
Alkaline Phosphatase: 35 U/L — ABNORMAL LOW (ref 38–126)
Anion gap: 9 (ref 5–15)
BUN: 27 mg/dL — ABNORMAL HIGH (ref 8–23)
CO2: 29 mmol/L (ref 22–32)
Calcium: 8.5 mg/dL — ABNORMAL LOW (ref 8.9–10.3)
Chloride: 103 mmol/L (ref 98–111)
Creatinine, Ser: 1.13 mg/dL — ABNORMAL HIGH (ref 0.44–1.00)
GFR calc Af Amer: 51 mL/min — ABNORMAL LOW (ref >60)
GFR calc non Af Amer: 44 mL/min — ABNORMAL LOW (ref >60)
Glucose, Bld: 203 mg/dL — ABNORMAL HIGH (ref 70–99)
Potassium: 3.7 mmol/L (ref 3.5–5.1)
Sodium: 141 mmol/L (ref 135–145)
Total Bilirubin: 0.8 mg/dL (ref 0.3–1.2)
Total Protein: 6.1 g/dL — ABNORMAL LOW (ref 6.5–8.1)

## 2019-11-30 LAB — CBC WITH DIFFERENTIAL/PLATELET
Abs Immature Granulocytes: 0.01 10*3/uL (ref 0.00–0.07)
Basophils Absolute: 0.1 10*3/uL (ref 0.0–0.1)
Basophils Relative: 1 %
Eosinophils Absolute: 0.1 10*3/uL (ref 0.0–0.5)
Eosinophils Relative: 2 %
HCT: 34.8 % — ABNORMAL LOW (ref 36.0–46.0)
Hemoglobin: 11.6 g/dL — ABNORMAL LOW (ref 12.0–15.0)
Immature Granulocytes: 0 %
Lymphocytes Relative: 19 %
Lymphs Abs: 0.9 10*3/uL (ref 0.7–4.0)
MCH: 38.7 pg — ABNORMAL HIGH (ref 26.0–34.0)
MCHC: 33.3 g/dL (ref 30.0–36.0)
MCV: 116 fL — ABNORMAL HIGH (ref 80.0–100.0)
Monocytes Absolute: 0.3 10*3/uL (ref 0.1–1.0)
Monocytes Relative: 6 %
Neutro Abs: 3.4 10*3/uL (ref 1.7–7.7)
Neutrophils Relative %: 72 %
Platelets: 322 10*3/uL (ref 150–400)
RBC: 3 MIL/uL — ABNORMAL LOW (ref 3.87–5.11)
RDW: 19.8 % — ABNORMAL HIGH (ref 11.5–15.5)
WBC: 4.8 10*3/uL (ref 4.0–10.5)
nRBC: 0 % (ref 0.0–0.2)

## 2019-11-30 MED ORDER — OCTREOTIDE ACETATE 30 MG IM KIT
30.0000 mg | PACK | INTRAMUSCULAR | Status: DC
  Administered 2019-11-30: 30 mg via INTRAMUSCULAR
  Filled 2019-11-30: qty 1

## 2019-12-29 ENCOUNTER — Other Ambulatory Visit: Payer: Self-pay

## 2019-12-29 ENCOUNTER — Inpatient Hospital Stay: Payer: Medicare PPO | Attending: Oncology

## 2019-12-29 ENCOUNTER — Encounter: Payer: Self-pay | Admitting: Oncology

## 2019-12-29 ENCOUNTER — Inpatient Hospital Stay (HOSPITAL_BASED_OUTPATIENT_CLINIC_OR_DEPARTMENT_OTHER): Payer: Medicare PPO | Admitting: Oncology

## 2019-12-29 ENCOUNTER — Inpatient Hospital Stay: Payer: Medicare PPO

## 2019-12-29 VITALS — BP 171/94 | HR 62 | Temp 94.9°F | Resp 16 | Wt 97.1 lb

## 2019-12-29 DIAGNOSIS — C7B02 Secondary carcinoid tumors of liver: Secondary | ICD-10-CM | POA: Diagnosis not present

## 2019-12-29 DIAGNOSIS — Z8249 Family history of ischemic heart disease and other diseases of the circulatory system: Secondary | ICD-10-CM | POA: Insufficient documentation

## 2019-12-29 DIAGNOSIS — I252 Old myocardial infarction: Secondary | ICD-10-CM | POA: Diagnosis not present

## 2019-12-29 DIAGNOSIS — Z803 Family history of malignant neoplasm of breast: Secondary | ICD-10-CM | POA: Insufficient documentation

## 2019-12-29 DIAGNOSIS — Z87891 Personal history of nicotine dependence: Secondary | ICD-10-CM | POA: Diagnosis not present

## 2019-12-29 DIAGNOSIS — I10 Essential (primary) hypertension: Secondary | ICD-10-CM | POA: Insufficient documentation

## 2019-12-29 DIAGNOSIS — D45 Polycythemia vera: Secondary | ICD-10-CM

## 2019-12-29 DIAGNOSIS — R197 Diarrhea, unspecified: Secondary | ICD-10-CM | POA: Diagnosis present

## 2019-12-29 DIAGNOSIS — D473 Essential (hemorrhagic) thrombocythemia: Secondary | ICD-10-CM | POA: Diagnosis not present

## 2019-12-29 DIAGNOSIS — C7A012 Malignant carcinoid tumor of the ileum: Secondary | ICD-10-CM | POA: Diagnosis present

## 2019-12-29 DIAGNOSIS — Z79899 Other long term (current) drug therapy: Secondary | ICD-10-CM | POA: Diagnosis not present

## 2019-12-29 DIAGNOSIS — Z5181 Encounter for therapeutic drug level monitoring: Secondary | ICD-10-CM

## 2019-12-29 DIAGNOSIS — Z8673 Personal history of transient ischemic attack (TIA), and cerebral infarction without residual deficits: Secondary | ICD-10-CM | POA: Diagnosis not present

## 2019-12-29 DIAGNOSIS — Z794 Long term (current) use of insulin: Secondary | ICD-10-CM | POA: Diagnosis not present

## 2019-12-29 DIAGNOSIS — Z853 Personal history of malignant neoplasm of breast: Secondary | ICD-10-CM | POA: Insufficient documentation

## 2019-12-29 DIAGNOSIS — E119 Type 2 diabetes mellitus without complications: Secondary | ICD-10-CM | POA: Diagnosis not present

## 2019-12-29 DIAGNOSIS — Z85828 Personal history of other malignant neoplasm of skin: Secondary | ICD-10-CM | POA: Diagnosis not present

## 2019-12-29 DIAGNOSIS — Z833 Family history of diabetes mellitus: Secondary | ICD-10-CM | POA: Insufficient documentation

## 2019-12-29 LAB — CBC WITH DIFFERENTIAL/PLATELET
Abs Immature Granulocytes: 0.05 10*3/uL (ref 0.00–0.07)
Basophils Absolute: 0.1 10*3/uL (ref 0.0–0.1)
Basophils Relative: 1 %
Eosinophils Absolute: 0.1 10*3/uL (ref 0.0–0.5)
Eosinophils Relative: 1 %
HCT: 37 % (ref 36.0–46.0)
Hemoglobin: 12.9 g/dL (ref 12.0–15.0)
Immature Granulocytes: 1 %
Lymphocytes Relative: 11 %
Lymphs Abs: 1.1 10*3/uL (ref 0.7–4.0)
MCH: 39.8 pg — ABNORMAL HIGH (ref 26.0–34.0)
MCHC: 34.9 g/dL (ref 30.0–36.0)
MCV: 114.2 fL — ABNORMAL HIGH (ref 80.0–100.0)
Monocytes Absolute: 0.4 10*3/uL (ref 0.1–1.0)
Monocytes Relative: 4 %
Neutro Abs: 8 10*3/uL — ABNORMAL HIGH (ref 1.7–7.7)
Neutrophils Relative %: 82 %
Platelets: 415 10*3/uL — ABNORMAL HIGH (ref 150–400)
RBC: 3.24 MIL/uL — ABNORMAL LOW (ref 3.87–5.11)
RDW: 14.6 % (ref 11.5–15.5)
WBC: 9.8 10*3/uL (ref 4.0–10.5)
nRBC: 0 % (ref 0.0–0.2)

## 2019-12-29 LAB — COMPREHENSIVE METABOLIC PANEL
ALT: 16 U/L (ref 0–44)
AST: 14 U/L — ABNORMAL LOW (ref 15–41)
Albumin: 4.4 g/dL (ref 3.5–5.0)
Alkaline Phosphatase: 50 U/L (ref 38–126)
Anion gap: 12 (ref 5–15)
BUN: 26 mg/dL — ABNORMAL HIGH (ref 8–23)
CO2: 28 mmol/L (ref 22–32)
Calcium: 9.2 mg/dL (ref 8.9–10.3)
Chloride: 100 mmol/L (ref 98–111)
Creatinine, Ser: 1.06 mg/dL — ABNORMAL HIGH (ref 0.44–1.00)
GFR calc Af Amer: 55 mL/min — ABNORMAL LOW (ref >60)
GFR calc non Af Amer: 48 mL/min — ABNORMAL LOW (ref >60)
Glucose, Bld: 209 mg/dL — ABNORMAL HIGH (ref 70–99)
Potassium: 4.2 mmol/L (ref 3.5–5.1)
Sodium: 140 mmol/L (ref 135–145)
Total Bilirubin: 1 mg/dL (ref 0.3–1.2)
Total Protein: 7.1 g/dL (ref 6.5–8.1)

## 2019-12-29 MED ORDER — OCTREOTIDE ACETATE 30 MG IM KIT
30.0000 mg | PACK | INTRAMUSCULAR | Status: AC
  Administered 2019-12-29: 30 mg via INTRAMUSCULAR
  Filled 2019-12-29: qty 1

## 2019-12-29 NOTE — Progress Notes (Addendum)
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 Mike Gip for polycythemia vera/ETand she was on Hydrea for the same. Her last visit with her as an outpatient was in May 2020. She was taking hydroxyurea 500 mg 5 times a week. 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 Tumor was 2.5 cm with 9+ lymph nodes pathologic stage was T3N1. Her chromogranin levels since then have been monitored regularly and they have been chronically elevated between 200-2 50. Patient was noted to have liver lesions but did not have any significant metabolic uptake on PET CT scan in November 2019. At her last visit with Dr. Mike Gip in May 2020 plan was to get dotatate PET scan followed by consideration for liver biopsy and at that time patient was lost to follow-up   Dotatate scan in February 2021 showed multiple areas of Liver metastases with intense radiotracer activity consistent with well-differentiated metastatic neuroendocrine tumor.  Single small peritoneal implant along ventral peritoneal surface.  No other evidence of malignancy.  Patient was started on octreotide in March 2021  Patient is also on Hydrea 5 times a week for her JAK2 positive ET.  She has had significant pancytopenia with higher  doses of Hydrea in the past  Interval history-patient is here with her caregiver today.  Caregiver reports overall Patient has been stable.  She gets occasional diarrhea.  Weight today is 97 pounds up from 92 pounds in May 2021.  No recent falls  ECOG PS- 3 Pain scale- 0   Review of systems- Review of Systems  Constitutional: Positive for malaise/fatigue. Negative for chills, fever and weight loss.  HENT: Negative for congestion, ear discharge and nosebleeds.   Eyes: Negative for blurred vision.  Respiratory: Negative for cough, hemoptysis, sputum production, shortness of breath and wheezing.   Cardiovascular: Negative for chest pain, palpitations, orthopnea and claudication.  Gastrointestinal: Positive for diarrhea. Negative for abdominal pain, blood in stool, constipation, heartburn, melena, nausea and vomiting.  Genitourinary: Negative for dysuria, flank pain, frequency, hematuria and urgency.  Musculoskeletal: Negative for back pain, joint pain and myalgias.  Skin: Negative for rash.  Neurological: Negative for dizziness, tingling, focal weakness, seizures, weakness and headaches.  Endo/Heme/Allergies: Does not bruise/bleed easily.  Psychiatric/Behavioral: Negative for depression and suicidal ideas. The patient does not have insomnia.       Allergies  Allergen Reactions  . Other Other (See Comments)    ALL OPIOIDS   . Simvastatin Other (See Comments)    Myalgia     Past Medical History:  Diagnosis Date  . Adenocarcinoma in situ of cervix   . Arthritis    hands  . Breast cancer ([place])   . Colon cancer ([place])   . Diabetes mellitus without complication ([place])   . Endometrial carcinoma ([facility])    s/p total abdominal hysterectomy  . H/O compression fracture of spine 2014  thoracic spine  . H/O polycythemia vera   . H/O TIA (transient ischemic attack) and stroke 09/2014, 03/2015   No deficits  . Hypertension   . Hyperuricemia   . Microalbuminuria   . Myocardial infarction  ([place])   . Polycythemia vera ([place])   . Recurrent falls   . Skin cancer    face, legs  . Stroke [facility]) 2008   no deficits  . Trochanteric bursitis   . Varicose veins    treated     Past Surgical History:  Procedure Laterality Date  . ABDOMINAL HYSTERECTOMY    . BOWEL RESECTION N/A 03/28/2015   Procedure: SMALL BOWEL RESECTION;  Surgeon: Leonie Green, MD;  Location: [facility] ORS;  Service: General;  Laterality: N/A;  . CATARACT EXTRACTION W/ INTRAOCULAR LENS IMPLANT Right   . CATARACT EXTRACTION W/PHACO Left 10/07/2015   Procedure: CATARACT EXTRACTION PHACO AND INTRAOCULAR LENS PLACEMENT (IOC);  Surgeon: Ronnell Freshwater, MD;  Location: [facility];  Service: Ophthalmology;  Laterality: Left;  DIABETIC - oral meds VISION BLUE  . CORONARY ANGIOGRAPHY N/A 10/29/2017   Procedure: CORONARY ANGIOGRAPHY;  Surgeon: Dionisio David, MD;  Location: [place] CV LAB;  Service: Cardiovascular;  Laterality: N/A;  . EXPLORATORY LAPAROTOMY     for fibroids  . HIP ARTHROPLASTY Left 12/01/2018   Procedure: ARTHROPLASTY BIPOLAR HIP (HEMIARTHROPLASTY);  Surgeon: Corky Mull, MD;  Location: [facility] ORS;  Service: Orthopedics;  Laterality: Left;  . LEFT HEART CATH Right 10/29/2017   Procedure: Left Heart Cath;  Surgeon: Dionisio David, MD;  Location: [place] CV LAB;  Service: Cardiovascular;  Laterality: Right;  . TONSILLECTOMY      Social History   Socioeconomic History  . Marital status: Widowed    Spouse name: Not on file  . Number of children: Not on file  . Years of education: Not on file  . Highest education level: Not on file  Occupational History  . Not on file  Tobacco Use  . Smoking status: Former Research scientist (life sciences)  . Smokeless tobacco: Never Used  Vaping Use  . Vaping Use: Never used  Substance and Sexual Activity  . Alcohol use: No  . Drug use: No  . Sexual activity: Not Currently  Other Topics Concern  . Not on file  Social History Narrative  . Not on file    Social Determinants of Health   Financial Resource Strain:   . Difficulty of Paying Living Expenses:   Food Insecurity:   . Worried About Charity fundraiser in the Last Year:   . Arboriculturist in the Last Year:   Transportation Needs:   . Film/video editor (Medical):   Marland Kitchen Lack of Transportation (Non-Medical):   Physical Activity:   . Days of Exercise per Week:   . Minutes of Exercise per Session:   Stress:   . Feeling of Stress :   Social Connections:   . Frequency of Communication with Friends and Family:   . Frequency of Social Gatherings with Friends and Family:   . Attends Religious Services:   . Active Member of Clubs or Organizations:   . Attends Archivist Meetings:   Marland Kitchen Marital Status:   Intimate Partner Violence:   . Fear of Current or Ex-Partner:   . Emotionally Abused:   Marland Kitchen Physically Abused:   . Sexually Abused:     Family History  Problem Relation Age of Onset  . Cancer Brother  AML  . Heart attack Mother   . Breast cancer Mother   . Heart attack Father   . Diabetes Father   . Heart attack Sister   . Diabetes Sister      Current Outpatient Medications:  .  amLODipine (NORVASC) 5 MG tablet, Take 1 tablet (5 mg total) by mouth daily., Disp: 30 tablet, Rfl: 1 .  calcium-vitamin D (OSCAL WITH D) 500-200 MG-UNIT per tablet, Take 1 tablet by mouth daily. , Disp: , Rfl:  .  clopidogrel (PLAVIX) 75 MG tablet, TAKE 1 TABLET BY MOUTH DAILY, Disp: 30 tablet, Rfl: 0 .  fexofenadine (ALLEGRA ALLERGY) 180 MG tablet, Take 1 tablet (180 mg total) by mouth daily., Disp: , Rfl:  .  fluticasone (FLONASE) 50 MCG/ACT nasal spray, Place 2 sprays into the nose daily., Disp: , Rfl:  .  hydroxyurea (HYDREA) 500 MG capsule, Take 1 capsule (500 mg total) by mouth daily., Disp: 30 capsule, Rfl: 3 .  nitroGLYCERIN (NITRODUR - DOSED IN MG/24 HR) 0.1 mg/hr patch, Place 0.1 mg onto the skin daily. , Disp: , Rfl:  .  pantoprazole (PROTONIX) 40 MG tablet, Take 1  tablet (40 mg total) by mouth daily., Disp: 30 tablet, Rfl: 0 .  pravastatin (PRAVACHOL) 20 MG tablet, Take 1 tablet (20 mg total) by mouth at bedtime., Disp: 30 tablet, Rfl: 0 .  QUEtiapine (SEROQUEL) 50 MG tablet, Take 50 mg by mouth at bedtime. , Disp: , Rfl:  .  ramipril (ALTACE) 5 MG capsule, Take 5 mg by mouth 2 (two) times daily., Disp: , Rfl:  .  sertraline (ZOLOFT) 50 MG tablet, Take 1 tablet (50 mg total) by mouth daily., Disp: 30 tablet, Rfl: 4 .  traMADol (ULTRAM) 50 MG tablet, , Disp: , Rfl:  .  vitamin B-12 1000 MCG tablet, Take 1 tablet (1,000 mcg total) by mouth daily., Disp: 30 tablet, Rfl: 1 .  acetaminophen (TYLENOL) 325 MG tablet, Take 1-2 tablets (325-650 mg total) by mouth every 6 (six) hours as needed for mild pain (pain score 1-3 or temp > 100.5). (Patient taking differently: Take 650 mg by mouth 3 (three) times daily. ), Disp:  , Rfl:  .  carvedilol (COREG) 25 MG tablet, Take 1 tablet (25 mg total) by mouth 2 (two) times daily with a meal. (Patient taking differently: Take 12.5 mg by mouth 2 (two) times daily with a meal. ), Disp: 60 tablet, Rfl: 0 .  insulin detemir (LEVEMIR) 100 unit/ml SOLN, Inject 0.08 mLs (8 Units total) into the skin at bedtime. (Patient taking differently: Inject 10 Units into the skin at bedtime. ), Disp: 8 mL, Rfl: 1 .  spironolactone (ALDACTONE) 25 MG tablet, Take 0.5 tablets (12.5 mg total) by mouth every other day for 30 days. (Patient taking differently: Take 12.5 mg by mouth See admin instructions. Take 12.5 mg by mouth twice daily every other day), Disp: 7.5 tablet, Rfl: 0 No current facility-administered medications for this visit.  Facility-Administered Medications Ordered in Other Visits:  .  octreotide (SANDOSTATIN LAR) IM injection 30 mg, 30 mg, Intramuscular, Q30 days, Sindy Guadeloupe, MD, 30 mg at 10/30/19 1134  Physical exam:  Vitals:   12/29/19 1006  BP: (!) 171/94  Pulse: 62  Resp: 16  Temp: (!) 94.9 F (34.9 C)  TempSrc:  Tympanic  SpO2: 96%  Weight: 97 lb 1.6 oz (44 kg)   Physical Exam Constitutional:      Comments: Thin elderly frail woman who ambulates with a walker.  Appears in no acute distress  Cardiovascular:     Rate and Rhythm: Normal rate and regular rhythm.     Heart sounds: Normal heart sounds.  Pulmonary:     Effort: Pulmonary effort is normal.     Breath sounds: Normal breath sounds.  Abdominal:     General: Bowel sounds are normal.     Palpations: Abdomen is soft.  Skin:    General: Skin is warm and dry.  Neurological:     Mental Status: She is alert and oriented to person, place, and time.      CMP Latest Ref Rng & Units 12/29/2019  Glucose 70 - 99 mg/dL 209(H)  BUN 8 - 23 mg/dL 26(H)  Creatinine 0.44 - 1.00 mg/dL 1.06(H)  Sodium 135 - 145 mmol/L 140  Potassium 3.5 - 5.1 mmol/L 4.2  Chloride 98 - 111 mmol/L 100  CO2 22 - 32 mmol/L 28  Calcium 8.9 - 10.3 mg/dL 9.2  Total Protein 6.5 - 8.1 g/dL 7.1  Total Bilirubin 0.3 - 1.2 mg/dL 1.0  Alkaline Phos 38 - 126 U/L 50  AST 15 - 41 U/L 14(L)  ALT 0 - 44 U/L 16   CBC Latest Ref Rng & Units 12/29/2019  WBC 4.0 - 10.5 K/uL 9.8  Hemoglobin 12.0 - 15.0 g/dL 12.9  Hematocrit 36 - 46 % 37.0  Platelets 150 - 400 K/uL 415(H)     Assessment and plan- Patient is a 86 y.o. female who is here for follow-up of following issues:  1.  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  I will see her on 02/02/2020 for her next octreotide shot with CBC with differential, CMP and chromogranin A.  Plan to get CT abdomen and pelvis with contrast prior to assess response to treatment so far  2.  JAK2 positive essential thrombocytosis: When patient was taking 6 to 7 days a week of Hydrea she had become pancytopenic.  Per caregiver she is currently taking 5 times a week.  Platelet counts are a little more than 400 but white count and hemoglobin are normal.  I will continue her at the same dose  and monitor her counts periodically.  She seems to be tolerating Hydrea well at this dose   Visit Diagnosis 1. 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 High risk medication use   3. Essential thrombocythemia ([place])   4. Encounter for monitoring octreotide therapy      Dr. Randa Evens, MD, MPH [facility] [phone] 12/29/2019 10:24 AM

## 2020-01-22 ENCOUNTER — Other Ambulatory Visit: Payer: Self-pay

## 2020-01-22 ENCOUNTER — Ambulatory Visit
Admission: RE | Admit: 2020-01-22 | Discharge: 2020-01-22 | Disposition: A | Discharge status: Home / Self Care | Payer: Medicare PPO | Source: Ambulatory Visit | Attending: Oncology | Admitting: Oncology

## 2020-01-22 DIAGNOSIS — R161 Splenomegaly, not elsewhere classified: Secondary | ICD-10-CM | POA: Insufficient documentation

## 2020-01-22 DIAGNOSIS — I700 Atherosclerosis of aorta: Secondary | ICD-10-CM | POA: Diagnosis not present

## 2020-01-22 DIAGNOSIS — C7B02 Secondary carcinoid tumors of liver: Secondary | ICD-10-CM | POA: Diagnosis not present

## 2020-01-22 MED ORDER — IOHEXOL 300 MG/ML  SOLN
75.0000 mL | Freq: Once | INTRAMUSCULAR | Status: AC | PRN
  Administered 2020-01-22: 75 mL via INTRAVENOUS

## 2020-01-31 ENCOUNTER — Encounter: Payer: Self-pay | Admitting: Adult Health

## 2020-01-31 ENCOUNTER — Ambulatory Visit: Payer: Medicare PPO | Admitting: Adult Health

## 2020-01-31 ENCOUNTER — Other Ambulatory Visit: Payer: Self-pay

## 2020-01-31 VITALS — BP 145/83 | HR 57 | Ht 63.0 in | Wt 92.6 lb

## 2020-01-31 DIAGNOSIS — E118 Type 2 diabetes mellitus with unspecified complications: Secondary | ICD-10-CM

## 2020-01-31 DIAGNOSIS — I10 Essential (primary) hypertension: Secondary | ICD-10-CM | POA: Diagnosis not present

## 2020-01-31 DIAGNOSIS — I69322 Dysarthria following cerebral infarction: Secondary | ICD-10-CM

## 2020-01-31 DIAGNOSIS — I69319 Unspecified symptoms and signs involving cognitive functions following cerebral infarction: Secondary | ICD-10-CM

## 2020-01-31 DIAGNOSIS — I614 Nontraumatic intracerebral hemorrhage in cerebellum: Secondary | ICD-10-CM

## 2020-01-31 DIAGNOSIS — Z794 Long term (current) use of insulin: Secondary | ICD-10-CM

## 2020-01-31 DIAGNOSIS — E785 Hyperlipidemia, unspecified: Secondary | ICD-10-CM

## 2020-01-31 NOTE — Patient Instructions (Signed)
Continue clopidogrel 75 mg daily  and pravastatin for secondary stroke prevention  Continue to follow up with PCP regarding cholesterol, blood pressure and diabetes management  Maintain strict control of hypertension with blood pressure goal below 130/90, diabetes with hemoglobin A1c goal below 6.5% and cholesterol with LDL cholesterol (bad cholesterol) goal below 70 mg/dL.       Followup in the future with me in 6 months or call earlier if needed       Thank you for coming to see [place] at [facility]. I hope we have been able to provide you high quality care today.  You may receive a patient satisfaction survey over the next few weeks. We would appreciate your feedback and comments so that we may continue to improve ourselves and the health of our patients.

## 2020-01-31 NOTE — Progress Notes (Signed)
[facility] [address] [place]. [place] [address] [phone]       STROKE FOLLOW UP NOTE  Ms. [name] Date of Birth:  Aug 02, 1931 Medical Record Number:  [id number]   Reason for visit: stroke follow up  Chief complaint: Chief Complaint  Patient presents with   Follow-up    3 month f/u. States she has had several falls since last visit.    room 9    with niece      HPI:  Today, 01/31/2020, Ms. Mathes returns for stroke follow-up accompanied by her niece.  Residual deficits of dysarthria and cognitive impairment.  Dysarthria has been stable.  Niece does believe that her cognition has been slowly declining with occasional confusional episodes, worsening short-term memory and occasional word finding difficulty.  Continues on Seroquel 50 mg nightly and sertraline managed by PCP.  No behavioral related concerns.  No additional hallucinations as initiating Seroquel.  MMSE today 21/30.  Denies new or worsening stroke/TIA symptoms  Continues on Plavix and pravastatin without side effects.  Blood pressure today 145/83.  Recent A1c 7.6.  HLD, HTN and DM managed by PCP.  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  Continues on octreotide  No further concerns     History provided for reference purposes only Update 10/04/2019 JM: Ms. Biehler is a 86 year old female who is being seen today, 10/04/2019, for stroke follow-up accompanied by her niece.  She has been stable from a stroke standpoint with residual stroke deficits of dysarthria and cognitive impairment.  Continues on sertraline 50 mg daily with stable mood as well as Seroquel 50 mg nightly which was recently increased by PCP due to sleeping difficulties with significant benefit.  Recently being followed by pain management due to bilateral knee pain R >L which limits ambulation and has been  started on tramadol 25 mg every other day with benefit.  Shortly after prior visit, she presented to ED on 07/18/2019 after mechanical fall and generalized weakness and found to have leukopenia and anemia likely secondary to hydroxyurea toxicity requiring transfusion.  She continues to follow with oncology regularly for polycythemia and use of hydroxyurea.  She continues to live in her own home but does have 24-hour caregivers.  Continues on Plavix and pravastatin for stroke prevention and blood pressure today 121/72.  No further concerns at this time.  Virtual visit 07/05/2019 JM: Ms. Bert is a 87 year old female who is being seen today via virtual visit for stroke follow-up accompanied by her niece.  She has not been seen in over 1 year due to COVID-19 safety precautions.  Residual deficits of dysarthria with hesitancy with wax/wane of symptoms as well as cognitive impairment.  Per daughter, she feels as though her speech has been gradually worsening as far as speaking slower or stuttering as well as worsening memory loss.  She does endorse increased stress and possible depression/anxiety with concerns of COVID-19 pandemic along with constantly being in pain with ongoing chronic knee pain.  She continues to receive 24-hour care for ongoing assistance.  She has continued on Plavix and pravastatin for secondary stroke prevention without side effects.  Prior A1c 7.0 on 11/30/2018.  Blood pressure 128-130/60-70. She unfortunately suffered a close subcapital fracture left femur after a fall on 11/30/2018 requiring open reduction internal fixation procedure. Healed well from hip stand point. Pain in her left knee possibly from arthritis per patient. Needs  walker for ambulation. Has been folowing up with ortho with receiving injections without benefit.  Recently referred to pain management clinic for ongoing management.  Denies new or worsening stroke/TIA symptoms.  Interval history 03/23/18 JM: Patient is being seen  today for scheduled follow-up visit and is accompanied by her niece.  She continues to have some stuttering concerns and right hand weakness but has been improving.  She participates in PT/OT/ST at [facility] and continues to live independently with 24/7 care.  Aids to assist with ADLs and IADLs.  Patient is questioning whether therapy continued at this time.  She continues to take Plavix only without side effects of bleeding or bruising.  She was planning on undergoing stent procedure by cardiologist at [facility] but this ended up being canceled and scheduled appointment with cardiologist through [facility] system who believes there is no benefit for stent placement and recommended continuation of medication management and monitoring.  Continues to take pravastatin without side effects myalgias.  Blood pressure today satisfactory 117/67.  Niece does state that current cardiologist has been managing antihypertensives and continues to fluctuate with SBP ranging from 1 10-1 60.  Denies new or worsening stroke/TIA symptoms.   Stroke admission 10/29/2017: GARNELL BEGEMAN an 86 y.o.femalewith a PMH of Stroke, Recurrent Falls, Polycythemia Vera,Microalbuminuria, HTN, TIA, Endometrial Carcinoma, Diabetes Mellitus, Colon Cancer, Breast Cancer, Arthritis, and Adenocarcinoma in Situ of Cervixpresented to [facility] hospital with black emesis and unresponsive 2/2 hypoxia to [facility] on 10/28/17. CTA Chest revealed pericardial effusion, small pleural effusions, and pulmonary edema. She was treated for sepsis secondary to pneumonia. She is on ASA and Plavix. She was noted to have NSTEMI and underwent cardiac cath which showed 3 vessel disease and severe LV dysfunction. Following cath procedure patient had a head CT which showedright cerebellar hemorrhagewith IVH.She was supposed to receive heparin drip, but was held. Patient transferred to [facility] hospital for further management.Hemorrhage was felt to be  secondary to hypertensive etiology on dual antiplatelet therapy.  Neuro wise, she was at risk for cerebral edema and treated with 3%.  There was no neurosurgery intervention that would benefit her.  She continued to progress though still had issues with pulmonary edema and CHF.  Developed leukocytosis, etiology unknown, but resolved during admission.  Once stable, was transferred to the inpatient rehab for ongoing therapy.  Plans are for PCI with stent in the near future. Recommended cardiology to follow-up in 2 weeks. Recommended to resume aspirin at time of discharge from inpatient (7 days) and resume Plavix in 3 weeks.  12/17/17 visit JM: Patient is being seen today for hospital follow-up and is accompanied by her niece.  She continues to improve from a neurological standpoint but does have mild right-sided weakness along with dysarthria.  She continues to participate in home PT/OT/ST.  She does live independently but does have 24/7 care.  Continues to take aspirin 81 mg at this time without bleeding or bruising.  They had not start Plavix as they state they need clearance from [place] first.  Continues to take pravastatin without myalgias.  Per niece, cardiologist states she needs to be on Plavix for 2 weeks prior to being considered for stent placement.  Blood pressure today satisfactory 124/69.  Patient denies new or worsening stroke/TIA symptoms.       ROS:   14 system review of systems performed and negative with exception of memory loss, joint pain, speech impairment  PMH:  Past Medical History:  Diagnosis Date  Adenocarcinoma in situ of cervix    Arthritis    hands   Breast cancer ([place])    Colon cancer ([place])    Diabetes mellitus without complication ([place])    Endometrial carcinoma ([place])    s/p total abdominal hysterectomy   H/O compression fracture of spine 2014   thoracic spine   H/O polycythemia vera    H/O TIA (transient ischemic attack) and stroke 09/2014, 03/2015   No  deficits   Hypertension    Hyperuricemia    Microalbuminuria    Myocardial infarction ([place])    Polycythemia vera ([place])    Recurrent falls    Skin cancer    face, legs   Stroke ([place]) 2008   no deficits   Trochanteric bursitis    Varicose veins    treated    PSH:  Past Surgical History:  Procedure Laterality Date   ABDOMINAL HYSTERECTOMY     BOWEL RESECTION N/A 03/28/2015   Procedure: SMALL BOWEL RESECTION;  Surgeon: Leonie Green, MD;  Location: [facility];  Service: General;  Laterality: N/A;   CATARACT EXTRACTION W/ INTRAOCULAR LENS IMPLANT Right    CATARACT EXTRACTION W/PHACO Left 10/07/2015   Procedure: CATARACT EXTRACTION PHACO AND INTRAOCULAR LENS PLACEMENT ([place]);  Surgeon: Ronnell Freshwater, MD;  Location: [facility];  Service: Ophthalmology;  Laterality: Left;  DIABETIC - oral meds VISION BLUE   CORONARY ANGIOGRAPHY N/A 10/29/2017   Procedure: CORONARY ANGIOGRAPHY;  Surgeon: Dionisio David, MD;  Location: [facility];  Service: Cardiovascular;  Laterality: N/A;   EXPLORATORY LAPAROTOMY     for fibroids   HIP ARTHROPLASTY Left 12/01/2018   Procedure: ARTHROPLASTY BIPOLAR HIP (HEMIARTHROPLASTY);  Surgeon: Corky Mull, MD;  Location: [facility];  Service: Orthopedics;  Laterality: Left;   LEFT HEART CATH Right 10/29/2017   Procedure: Left Heart Cath;  Surgeon: Dionisio David, MD;  Location: [facility];  Service: Cardiovascular;  Laterality: Right;   TONSILLECTOMY      Social History:  Social History   Socioeconomic History   Marital status: Widowed    Spouse name: Not on file   Number of children: Not on file   Years of education: Not on file   Highest education level: Not on file  Occupational History   Not on file  Tobacco Use   Smoking status: Former Smoker   Smokeless tobacco: Never Used  Scientific laboratory technician Use: Never used  Substance and Sexual Activity   Alcohol use: No   Drug use: No    Sexual activity: Not Currently  Other Topics Concern   Not on file  Social History Narrative   Not on file   Social Determinants of Health   Financial Resource Strain:    Difficulty of Paying Living Expenses:   Food Insecurity:    Worried About Charity fundraiser in the Last Year:    Arboriculturist in the Last Year:   Transportation Needs:    Film/video editor (Medical):    Lack of Transportation (Non-Medical):   Physical Activity:    Days of Exercise per Week:    Minutes of Exercise per Session:   Stress:    Feeling of Stress :   Social Connections:    Frequency of Communication with Friends and Family:    Frequency of Social Gatherings with Friends and Family:    Attends Religious Services:    Active Member of Clubs or Organizations:  Attends Music therapist:    Marital Status:   Intimate Partner Violence:    Fear of Current or Ex-Partner:    Emotionally Abused:    Physically Abused:    Sexually Abused:     Family History:  Family History  Problem Relation Age of Onset   Cancer Brother        AML   Heart attack Mother    Breast cancer Mother    Heart attack Father    Diabetes Father    Heart attack Sister    Diabetes Sister     Medications:   Current Outpatient Medications on File Prior to Visit  Medication Sig Dispense Refill   acetaminophen (TYLENOL) 325 MG tablet Take 1-2 tablets (325-650 mg total) by mouth every 6 (six) hours as needed for mild pain (pain score 1-3 or temp > 100.5). (Patient taking differently: Take 650 mg by mouth 3 (three) times daily. )     amLODipine (NORVASC) 5 MG tablet Take 1 tablet (5 mg total) by mouth daily. 30 tablet 1   calcium-vitamin D (OSCAL WITH D) 500-200 MG-UNIT per tablet Take 1 tablet by mouth daily.      carvedilol (COREG) 25 MG tablet Take 1 tablet (25 mg total) by mouth 2 (two) times daily with a meal. (Patient taking differently: Take 12.5 mg by mouth 2 (two)  times daily with a meal. ) 60 tablet 0   clopidogrel (PLAVIX) 75 MG tablet TAKE 1 TABLET BY MOUTH DAILY 30 tablet 0   fexofenadine (ALLEGRA ALLERGY) 180 MG tablet Take 1 tablet (180 mg total) by mouth daily.     fluticasone (FLONASE) 50 MCG/ACT nasal spray Place 2 sprays into the nose daily.     hydroxyurea (HYDREA) 500 MG capsule Take 1 capsule (500 mg total) by mouth daily. 30 capsule 3   insulin detemir (LEVEMIR) 100 unit/ml SOLN Inject 0.08 mLs (8 Units total) into the skin at bedtime. (Patient taking differently: Inject 12 Units into the skin at bedtime. ) 8 mL 1   nitroGLYCERIN (NITRODUR - DOSED IN MG/24 HR) 0.1 mg/hr patch Place 0.1 mg onto the skin daily.      pantoprazole (PROTONIX) 40 MG tablet Take 1 tablet (40 mg total) by mouth daily. 30 tablet 0   pravastatin (PRAVACHOL) 20 MG tablet Take 1 tablet (20 mg total) by mouth at bedtime. 30 tablet 0   QUEtiapine (SEROQUEL) 50 MG tablet Take 50 mg by mouth at bedtime.      ramipril (ALTACE) 5 MG capsule Take 5 mg by mouth 2 (two) times daily.     sertraline (ZOLOFT) 50 MG tablet Take 1 tablet (50 mg total) by mouth daily. 30 tablet 4   traMADol (ULTRAM) 50 MG tablet 25 mg every other day.      vitamin B-12 1000 MCG tablet Take 1 tablet (1,000 mcg total) by mouth daily. 30 tablet 1   spironolactone (ALDACTONE) 25 MG tablet Take 0.5 tablets (12.5 mg total) by mouth every other day for 30 days. (Patient taking differently: Take 12.5 mg by mouth See admin instructions. Take 12.5 mg by mouth twice daily every other day) 7.5 tablet 0   Current Facility-Administered Medications on File Prior to Visit  Medication Dose Route Frequency Provider Last Rate Last Admin   octreotide (SANDOSTATIN LAR) IM injection 30 mg  30 mg Intramuscular Q30 days Sindy Guadeloupe, MD   30 mg at 10/30/19 1134   octreotide (SANDOSTATIN LAR) IM injection 30 mg  30 mg Intramuscular Q30 days Sindy Guadeloupe, MD   30 mg at 12/29/19 1044    Allergies:    Allergies  Allergen Reactions   Other Other (See Comments)    ALL OPIOIDS    Simvastatin Other (See Comments)    Myalgia     Vitals: Today's Vitals   01/31/20 1340  BP: (!) 145/83  Pulse: (!) 57  Weight: 92 lb 9.6 oz (42 kg)  Height: 5\' 3"  (1.6 m)   Body mass index is 16.4 kg/m.   Physical Exam  General: Frail pleasant elderly Caucasian female, seated, in no evident distress Head: head normocephalic and atraumatic.   Neck: supple with no carotid or supraclavicular bruits Cardiovascular: regular rate and rhythm, no murmurs Musculoskeletal: no deformity Skin:  no rash/petichiae; skin tears Vascular:  Normal pulses all extremities   Neurologic Exam Mental Status: Awake and fully alert. Mild to moderate dysarthria. Recent and remote memory diminished. Mood and affect appropriate.   MMSE - Mini Mental State Exam 01/31/2020  Orientation to time 2  Orientation to Place 3  Registration 3  Attention/ Calculation 5  Recall 2  Language- name 2 objects 0  Language- repeat 1  Language- follow 3 step command 3  Language- read & follow direction 1  Write a sentence 1  Copy design 0  Total score 21   Cranial Nerves: Pupils equal, briskly reactive to light. Extraocular movements full without nystagmus. Visual fields full to confrontation. Hearing intact. Facial sensation intact. Face, tongue, palate moves normally and symmetrically.  Motor: Normal bulk and tone. Normal strength in all tested extremity muscles. Sensory.: intact to touch , pinprick , position and vibratory sensation.  Coordination: Rapid alternating movements normal in all extremities. Finger-to-nose performed accurately bilaterally and heel-to-shin difficulty performing due to bilateral knee pain Gait and Station: Arises from chair without difficulty. Stance is hunched. Gait demonstrates normal short cautious steps with use of Rollator walker Reflexes: 1+ and symmetric. Toes downgoing.           ASSESSMENT: ARIZA EVANS is a 86 y.o. year old female here with right cerebellar parenchymal hemorrhage with IVH on 10/31/17 secondary to hypertensive plus DAPT. 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  Residual stroke deficits of dysarthria and cognitive impairment.     PLAN: -Continue Plavix 75 mg daily and pravastatin for secondary stroke prevention -F/u with PCP regarding your HLD, HTN and DM management -We will repeat MMSE at follow-up visit for ongoing monitoring. 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 I also advised the patient to eat a healthy diet with plenty of whole grains, cereals, fruits and vegetables, exercise regularly and maintain ideal body weight.   Follow up in 6 months or call earlier if needed   I spent 30 minutes of face-to-face and non-face-to-face time with patient and niece.  This included previsit chart review, lab review, study review, order entry, electronic health record documentation, patient education regarding prior stroke, managing stroke risk factors, residual deficits including cognitive impairment with performing and reviewing MMSE, and answering  all questions to patient and family satisfaction   Frann Rider, [facility]  [facility] [address] [place], [place] [address]  Phone [phone] Fax [phone]

## 2020-02-01 ENCOUNTER — Other Ambulatory Visit: Payer: Self-pay | Admitting: *Deleted

## 2020-02-01 DIAGNOSIS — C7B02 Secondary carcinoid tumors of liver: Secondary | ICD-10-CM

## 2020-02-01 DIAGNOSIS — D473 Essential (hemorrhagic) thrombocythemia: Secondary | ICD-10-CM

## 2020-02-01 NOTE — Progress Notes (Signed)
I agree with the above plan 

## 2020-02-02 ENCOUNTER — Other Ambulatory Visit: Payer: Self-pay

## 2020-02-02 ENCOUNTER — Inpatient Hospital Stay: Payer: Medicare PPO

## 2020-02-02 ENCOUNTER — Inpatient Hospital Stay: Payer: Medicare PPO | Attending: Oncology

## 2020-02-02 ENCOUNTER — Inpatient Hospital Stay (HOSPITAL_BASED_OUTPATIENT_CLINIC_OR_DEPARTMENT_OTHER): Payer: Medicare PPO | Admitting: Oncology

## 2020-02-02 DIAGNOSIS — Z803 Family history of malignant neoplasm of breast: Secondary | ICD-10-CM | POA: Diagnosis not present

## 2020-02-02 DIAGNOSIS — Z806 Family history of leukemia: Secondary | ICD-10-CM | POA: Diagnosis not present

## 2020-02-02 DIAGNOSIS — Z8249 Family history of ischemic heart disease and other diseases of the circulatory system: Secondary | ICD-10-CM | POA: Diagnosis not present

## 2020-02-02 DIAGNOSIS — E119 Type 2 diabetes mellitus without complications: Secondary | ICD-10-CM | POA: Diagnosis not present

## 2020-02-02 DIAGNOSIS — Z833 Family history of diabetes mellitus: Secondary | ICD-10-CM | POA: Insufficient documentation

## 2020-02-02 DIAGNOSIS — D473 Essential (hemorrhagic) thrombocythemia: Secondary | ICD-10-CM

## 2020-02-02 DIAGNOSIS — R197 Diarrhea, unspecified: Secondary | ICD-10-CM | POA: Diagnosis not present

## 2020-02-02 DIAGNOSIS — C7A012 Malignant carcinoid tumor of the ileum: Secondary | ICD-10-CM | POA: Insufficient documentation

## 2020-02-02 DIAGNOSIS — I119 Hypertensive heart disease without heart failure: Secondary | ICD-10-CM | POA: Insufficient documentation

## 2020-02-02 DIAGNOSIS — Z79899 Other long term (current) drug therapy: Secondary | ICD-10-CM | POA: Diagnosis not present

## 2020-02-02 DIAGNOSIS — D3A029 Benign carcinoid tumor of the large intestine, unspecified portion: Secondary | ICD-10-CM

## 2020-02-02 DIAGNOSIS — Z794 Long term (current) use of insulin: Secondary | ICD-10-CM | POA: Diagnosis not present

## 2020-02-02 DIAGNOSIS — C7B02 Secondary carcinoid tumors of liver: Secondary | ICD-10-CM

## 2020-02-02 DIAGNOSIS — I252 Old myocardial infarction: Secondary | ICD-10-CM | POA: Diagnosis not present

## 2020-02-02 DIAGNOSIS — Z8673 Personal history of transient ischemic attack (TIA), and cerebral infarction without residual deficits: Secondary | ICD-10-CM | POA: Insufficient documentation

## 2020-02-02 DIAGNOSIS — Z90710 Acquired absence of both cervix and uterus: Secondary | ICD-10-CM | POA: Diagnosis not present

## 2020-02-02 DIAGNOSIS — Z87891 Personal history of nicotine dependence: Secondary | ICD-10-CM | POA: Insufficient documentation

## 2020-02-02 DIAGNOSIS — D45 Polycythemia vera: Secondary | ICD-10-CM

## 2020-02-02 LAB — CBC WITH DIFFERENTIAL/PLATELET
Abs Immature Granulocytes: 0.05 10*3/uL (ref 0.00–0.07)
Basophils Absolute: 0.1 10*3/uL (ref 0.0–0.1)
Basophils Relative: 1 %
Eosinophils Absolute: 0.2 10*3/uL (ref 0.0–0.5)
Eosinophils Relative: 2 %
HCT: 37.3 % (ref 36.0–46.0)
Hemoglobin: 12.5 g/dL (ref 12.0–15.0)
Immature Granulocytes: 1 %
Lymphocytes Relative: 10 %
Lymphs Abs: 0.9 10*3/uL (ref 0.7–4.0)
MCH: 38.6 pg — ABNORMAL HIGH (ref 26.0–34.0)
MCHC: 33.5 g/dL (ref 30.0–36.0)
MCV: 115.1 fL — ABNORMAL HIGH (ref 80.0–100.0)
Monocytes Absolute: 0.4 10*3/uL (ref 0.1–1.0)
Monocytes Relative: 4 %
Neutro Abs: 7.4 10*3/uL (ref 1.7–7.7)
Neutrophils Relative %: 82 %
Platelets: 488 10*3/uL — ABNORMAL HIGH (ref 150–400)
RBC: 3.24 MIL/uL — ABNORMAL LOW (ref 3.87–5.11)
RDW: 14.9 % (ref 11.5–15.5)
WBC: 9 10*3/uL (ref 4.0–10.5)
nRBC: 0 % (ref 0.0–0.2)

## 2020-02-02 LAB — COMPREHENSIVE METABOLIC PANEL
ALT: 15 U/L (ref 0–44)
AST: 17 U/L (ref 15–41)
Albumin: 3.8 g/dL (ref 3.5–5.0)
Alkaline Phosphatase: 48 U/L (ref 38–126)
Anion gap: 14 (ref 5–15)
BUN: 27 mg/dL — ABNORMAL HIGH (ref 8–23)
CO2: 27 mmol/L (ref 22–32)
Calcium: 8.6 mg/dL — ABNORMAL LOW (ref 8.9–10.3)
Chloride: 98 mmol/L (ref 98–111)
Creatinine, Ser: 1.1 mg/dL — ABNORMAL HIGH (ref 0.44–1.00)
GFR calc Af Amer: 53 mL/min — ABNORMAL LOW (ref >60)
GFR calc non Af Amer: 46 mL/min — ABNORMAL LOW (ref >60)
Glucose, Bld: 231 mg/dL — ABNORMAL HIGH (ref 70–99)
Potassium: 4.4 mmol/L (ref 3.5–5.1)
Sodium: 139 mmol/L (ref 135–145)
Total Bilirubin: 0.8 mg/dL (ref 0.3–1.2)
Total Protein: 6.1 g/dL — ABNORMAL LOW (ref 6.5–8.1)

## 2020-02-02 MED ORDER — OCTREOTIDE ACETATE 30 MG IM KIT
30.0000 mg | PACK | INTRAMUSCULAR | Status: AC
  Administered 2020-02-02: 30 mg via INTRAMUSCULAR
  Filled 2020-02-02: qty 1

## 2020-02-04 ENCOUNTER — Encounter: Payer: Self-pay | Admitting: Oncology

## 2020-02-04 NOTE — Progress Notes (Signed)
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 Mike Gip for polycythemia vera/ETand she was on Hydrea for the same. Her last visit with her as an outpatient was in May 2020. She was taking hydroxyurea 500 mg 5 times a week. 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 Tumor was 2.5 cm with 9+ lymph nodes pathologic stage was T3N1. Her chromogranin levels since then have been monitored regularly and they have been chronically elevated between 200-2 50. Patient was noted to have liver lesions but did not have any significant metabolic uptake on PET CT scan in November 2019. At her last visit with Dr. Mike Gip in May 2020 plan was to get dotatate PET scan followed by consideration for liver biopsy and at that time patient was lost to follow-up   Dotatate scan in February 2021 showed multiple areas of Liver metastases with intense radiotracer activity consistent with well-differentiated metastatic neuroendocrine tumor.  Single small peritoneal implant along ventral peritoneal surface.  No other evidence of malignancy.  Patient was started on octreotide in March 2021  Patient is also on Hydrea 5 times a week for her JAK2 positive ET.  She has had significant pancytopenia with higher doses  of Hydrea in the past   Interval history- she continues to be frail and had another fall 1 day back. Has occasional diarrhea for which she uses imodium. These are more frequent a few days before she is due for her next octreotide shot  ECOG PS- 2-3 Pain scale- 0   Review of systems- Review of Systems  Constitutional: Positive for malaise/fatigue. Negative for chills, fever and weight loss.  HENT: Negative for congestion, ear discharge and nosebleeds.   Eyes: Negative for blurred vision.  Respiratory: Negative for cough, hemoptysis, sputum production, shortness of breath and wheezing.   Cardiovascular: Negative for chest pain, palpitations, orthopnea and claudication.  Gastrointestinal: Negative for abdominal pain, blood in stool, constipation, diarrhea, heartburn, melena, nausea and vomiting.  Genitourinary: Negative for dysuria, flank pain, frequency, hematuria and urgency.  Musculoskeletal: Positive for falls. Negative for back pain, joint pain and myalgias.  Skin: Negative for rash.  Neurological: Negative for dizziness, tingling, focal weakness, seizures, weakness and headaches.  Endo/Heme/Allergies: Does not bruise/bleed easily.  Psychiatric/Behavioral: Negative for depression and suicidal ideas. The patient does not have insomnia.       Allergies  Allergen Reactions  . Other Other (See Comments)    ALL OPIOIDS   . Simvastatin Other (See Comments)    Myalgia     Past Medical History:  Diagnosis Date  . Adenocarcinoma in situ of cervix   . Arthritis    hands  . Breast cancer ([place])   . Colon cancer ([place])   . Diabetes mellitus without complication ([place])   . Endometrial carcinoma ([facility])    s/p total abdominal hysterectomy  . H/O compression fracture of  spine 2014   thoracic spine  . H/O polycythemia vera   . H/O TIA (transient ischemic attack) and stroke 09/2014, 03/2015   No deficits  . Hypertension   . Hyperuricemia   . Microalbuminuria   . Myocardial infarction  ([place])   . Polycythemia vera ([place])   . Recurrent falls   . Skin cancer    face, legs  . Stroke [facility]) 2008   no deficits  . Trochanteric bursitis   . Varicose veins    treated     Past Surgical History:  Procedure Laterality Date  . ABDOMINAL HYSTERECTOMY    . BOWEL RESECTION N/A 03/28/2015   Procedure: SMALL BOWEL RESECTION;  Surgeon: Leonie Green, MD;  Location: [facility] ORS;  Service: General;  Laterality: N/A;  . CATARACT EXTRACTION W/ INTRAOCULAR LENS IMPLANT Right   . CATARACT EXTRACTION W/PHACO Left 10/07/2015   Procedure: CATARACT EXTRACTION PHACO AND INTRAOCULAR LENS PLACEMENT (IOC);  Surgeon: Ronnell Freshwater, MD;  Location: [place];  Service: Ophthalmology;  Laterality: Left;  DIABETIC - oral meds VISION BLUE  . CORONARY ANGIOGRAPHY N/A 10/29/2017   Procedure: CORONARY ANGIOGRAPHY;  Surgeon: Dionisio David, MD;  Location: [place] [facility];  Service: Cardiovascular;  Laterality: N/A;  . EXPLORATORY LAPAROTOMY     for fibroids  . HIP ARTHROPLASTY Left 12/01/2018   Procedure: ARTHROPLASTY BIPOLAR HIP (HEMIARTHROPLASTY);  Surgeon: Corky Mull, MD;  Location: [facility] OR[facility];  Service: Orthopedics;  Laterality: Left;  . LEFT HEART CATH Right 10/29/2017   Procedure: Left Heart Cath;  Surgeon: Dionisio David, MD;  Location: [place] [facility];  Service: Cardiovascular;  Laterality: Right;  . TONSILLECTOMY      Social History   Socioeconomic History  . Marital status: Widowed    Spouse name: Not on file  . Number of children: Not on file  . Years of education: Not on file  . Highest education level: Not on file  Occupational History  . Not on file  Tobacco Use  . Smoking status: Former Research scientist (life sciences)  . Smokeless tobacco: Never Used  Vaping Use  . Vaping Use: Never used  Substance and Sexual Activity  . Alcohol use: No  . Drug use: No  . Sexual activity: Not Currently  Other Topics Concern  . Not on file  Social History Narrative  . Not on file    Social Determinants of Health   Financial Resource Strain:   . Difficulty of Paying Living Expenses:   Food Insecurity:   . Worried About Charity fundraiser in the Last Year:   . Arboriculturist in the Last Year:   Transportation Needs:   . Film/video editor (Medical):   Marland Kitchen Lack of Transportation (Non-Medical):   Physical Activity:   . Days of Exercise per Week:   . Minutes of Exercise per Session:   Stress:   . Feeling of Stress :   Social Connections:   . Frequency of Communication with Friends and Family:   . Frequency of Social Gatherings with Friends and Family:   . Attends Religious Services:   . Active Member of Clubs or Organizations:   . Attends Archivist Meetings:   Marland Kitchen Marital Status:   Intimate Partner Violence:   . Fear of Current or Ex-Partner:   . Emotionally Abused:   Marland Kitchen Physically Abused:   . Sexually Abused:     Family History  Problem Relation Age of Onset  . Cancer Brother  AML  . Heart attack Mother   . Breast cancer Mother   . Heart attack Father   . Diabetes Father   . Heart attack Sister   . Diabetes Sister      Current Outpatient Medications:  .  acetaminophen (TYLENOL) 325 MG tablet, Take 1-2 tablets (325-650 mg total) by mouth every 6 (six) hours as needed for mild pain (pain score 1-3 or temp > 100.5). (Patient taking differently: Take 650 mg by mouth 3 (three) times daily. ), Disp:  , Rfl:  .  amLODipine (NORVASC) 5 MG tablet, Take 1 tablet (5 mg total) by mouth daily., Disp: 30 tablet, Rfl: 1 .  calcium-vitamin D (OSCAL WITH D) 500-200 MG-UNIT per tablet, Take 1 tablet by mouth daily. , Disp: , Rfl:  .  carvedilol (COREG) 25 MG tablet, Take 1 tablet (25 mg total) by mouth 2 (two) times daily with a meal. (Patient taking differently: Take 12.5 mg by mouth 2 (two) times daily with a meal. ), Disp: 60 tablet, Rfl: 0 .  clopidogrel (PLAVIX) 75 MG tablet, TAKE 1 TABLET BY MOUTH DAILY, Disp: 30 tablet, Rfl: 0 .   fexofenadine (ALLEGRA ALLERGY) 180 MG tablet, Take 1 tablet (180 mg total) by mouth daily., Disp: , Rfl:  .  fluticasone (FLONASE) 50 MCG/ACT nasal spray, Place 2 sprays into the nose daily., Disp: , Rfl:  .  hydroxyurea (HYDREA) 500 MG capsule, Take 1 capsule (500 mg total) by mouth daily., Disp: 30 capsule, Rfl: 3 .  insulin detemir (LEVEMIR) 100 unit/ml SOLN, Inject 0.08 mLs (8 Units total) into the skin at bedtime. (Patient taking differently: Inject 12 Units into the skin at bedtime. ), Disp: 8 mL, Rfl: 1 .  nitroGLYCERIN (NITRODUR - DOSED IN MG/24 HR) 0.1 mg/hr patch, Place 0.1 mg onto the skin daily. , Disp: , Rfl:  .  pantoprazole (PROTONIX) 40 MG tablet, Take 1 tablet (40 mg total) by mouth daily., Disp: 30 tablet, Rfl: 0 .  pravastatin (PRAVACHOL) 20 MG tablet, Take 1 tablet (20 mg total) by mouth at bedtime., Disp: 30 tablet, Rfl: 0 .  QUEtiapine (SEROQUEL) 50 MG tablet, Take 50 mg by mouth at bedtime. , Disp: , Rfl:  .  ramipril (ALTACE) 5 MG capsule, Take 5 mg by mouth 2 (two) times daily., Disp: , Rfl:  .  sertraline (ZOLOFT) 50 MG tablet, Take 1 tablet (50 mg total) by mouth daily., Disp: 30 tablet, Rfl: 4 .  vitamin B-12 1000 MCG tablet, Take 1 tablet (1,000 mcg total) by mouth daily., Disp: 30 tablet, Rfl: 1 .  spironolactone (ALDACTONE) 25 MG tablet, Take 0.5 tablets (12.5 mg total) by mouth every other day for 30 days. (Patient taking differently: Take 12.5 mg by mouth See admin instructions. Take 12.5 mg by mouth twice daily every other day), Disp: 7.5 tablet, Rfl: 0 .  traMADol (ULTRAM) 50 MG tablet, 25 mg every other day. , Disp: , Rfl:  No current facility-administered medications for this visit.  Facility-Administered Medications Ordered in Other Visits:  .  octreotide (SANDOSTATIN LAR) IM injection 30 mg, 30 mg, Intramuscular, Q30 days, Sindy Guadeloupe, MD, 30 mg at 10/30/19 1134 .  octreotide (SANDOSTATIN LAR) IM injection 30 mg, 30 mg, Intramuscular, Q30 days, Sindy Guadeloupe, MD, 30 mg at 12/29/19 1044 .  octreotide (SANDOSTATIN LAR) IM injection 30 mg, 30 mg, Intramuscular, Q30 days, Sindy Guadeloupe, MD, 30 mg at 02/02/20 1512  Physical exam: There were no vitals filed for  this visit. Physical Exam Constitutional:      General: She is not in acute distress.    Comments: Thin elderly frail woman sitting in a wheelchair  Cardiovascular:     Rate and Rhythm: Normal rate and regular rhythm.     Heart sounds: Normal heart sounds.  Pulmonary:     Effort: Pulmonary effort is normal.     Breath sounds: Normal breath sounds.  Abdominal:     General: Bowel sounds are normal.     Palpations: Abdomen is soft.  Skin:    General: Skin is warm and dry.  Neurological:     Mental Status: She is alert.      CMP Latest Ref Rng & Units 02/02/2020  Glucose 70 - 99 mg/dL 231(H)  BUN 8 - 23 mg/dL 27(H)  Creatinine 0.44 - 1.00 mg/dL 1.10(H)  Sodium 135 - 145 mmol/L 139  Potassium 3.5 - 5.1 mmol/L 4.4  Chloride 98 - 111 mmol/L 98  CO2 22 - 32 mmol/L 27  Calcium 8.9 - 10.3 mg/dL 8.6(L)  Total Protein 6.5 - 8.1 g/dL 6.1(L)  Total Bilirubin 0.3 - 1.2 mg/dL 0.8  Alkaline Phos 38 - 126 U/L 48  AST 15 - 41 U/L 17  ALT 0 - 44 U/L 15   CBC Latest Ref Rng & Units 02/02/2020  WBC 4.0 - 10.5 K/uL 9.0  Hemoglobin 12.0 - 15.0 g/dL 12.5  Hematocrit 36 - 46 % 37.3  Platelets 150 - 400 K/uL 488(H)    No images are attached to the encounter.  CT Abdomen Pelvis W Contrast  Result Date: 01/22/2020 CLINICAL DATA:  Metastatic carcinoid tumor. EXAM: CT ABDOMEN AND PELVIS WITH CONTRAST TECHNIQUE: Multidetector CT imaging of the abdomen and pelvis was performed using the standard protocol following bolus administration of intravenous contrast. CONTRAST:  68mL OMNIPAQUE IOHEXOL 300 MG/ML  SOLN COMPARISON:  PET-CT 08/03/2019 and MRI abdomen 07/04/2018 FINDINGS: Lower chest: Bibasilar scarring changes and dependent subpleural atelectasis. No worrisome pulmonary lesions or pleural  effusions. The heart is enlarged but stable. Stable coronary artery, aortic and aortic valve calcifications. Hepatobiliary: There are several subtle difficult to measure liver lesions. Grossly is a appears stable when compared to the prior PET-CT from 08/03/2019. I do not see any new or progressive findings for certain. Segment 3 lesion on image 16/2 measures approximately 16 mm. Segment 6 lesion on image 30/2 measures a maximum of 15 mm. Pancreas: No mass, inflammation or ductal dilatation. Fairly advanced atrophy. Spleen: Splenomegaly. The spleen measures 12.5 x 12.0 x 9.0 cm. No splenic lesions. Adrenals/Urinary Tract: The adrenal glands and kidneys are unremarkable and stable. Moderate age related renal cortical thinning but no worrisome renal lesions or hydronephrosis. The bladder is grossly normal. Stomach/Bowel: The stomach, duodenum, small bowel and colon are grossly normal. No acute inflammatory changes, mass lesions or obstructive findings. Moderate to large amount of stool throughout the colon and down to the rectum suggesting constipation and mild fecal impaction. Vascular/Lymphatic: Tortuosity and calcification of the thoracic aorta but no dissection. Advanced iliac artery calcifications. No mesenteric or retroperitoneal mass or adenopathy. Small scattered mesenteric nodes but no mass or overt adenopathy. No retroperitoneal adenopathy. Stable small (4.25 mm) mesenteric nodule on image 37/2. Reproductive: Surgically absent. Other: No pelvic mass or pelvic adenopathy. No significant free pelvic fluid collections. Musculoskeletal: No significant bony findings. Moderate degenerative disc disease at L5-S1 with disc space narrowing, early spurring and subchondral cystic change. IMPRESSION: 1. Stable hepatic metastatic disease. No new or progressive findings.  2. No abdominal/pelvic lymphadenopathy. 3. Stable mild splenomegaly. 4. Moderate to large amount of stool throughout the colon and down to the rectum  suggesting constipation and mild fecal impaction. 5. Stable advanced atherosclerotic calcifications involving the aorta and branch vessels. 6. Aortic atherosclerosis. Aortic Atherosclerosis (ICD10-I70.0). Electronically Signed   By: Marijo Sanes M.D.   On: 01/22/2020 14:27     Assessment and plan- Patient is a 86 y.o. female who is here for following issues:  1. Metastatic well-differentiated carcinoid with liver mets: Patient will continue to get monthly octreotide shots and will get her next injection today.  CT abdomen and pelvis with contrast showed stable liver metastases.  Her chromogranin levels from today are pending but prior values show her decreasing levels after starting octreotide.  2. JAK2 positive essential thrombocytosis: Continue Hydrea 5 times a week.  Her counts are essentially stable at this time although her platelet count is a little more than 148 today.  Continue to monitor.   Repeat CBC with differential, CMP and chromogranin A levels in 3 months and I will see her back in 3 months.  She will continue to get monthly octreotide shots in the entire   Visit Diagnosis 1. Essential thrombocythemia ([place])   2. High risk medication use   3. Carcinoid tumor of colon      Dr. Randa Evens, MD, MPH [facility] at [facility] [phone] 02/04/2020 5:03 PM

## 2020-02-06 LAB — CHROMOGRANIN A: Chromogranin A (ng/mL): 211.1 ng/mL — ABNORMAL HIGH (ref 0.0–101.8)

## 2020-02-13 ENCOUNTER — Telehealth: Payer: Self-pay | Admitting: Adult Health Nurse Practitioner

## 2020-02-13 NOTE — Telephone Encounter (Signed)
Called to schedule appointment.  Left VM with reason for call and call back info Isolde Skaff K. Patrich Heinze NP 

## 2020-03-04 ENCOUNTER — Inpatient Hospital Stay: Payer: Medicare PPO | Attending: Oncology

## 2020-03-04 ENCOUNTER — Other Ambulatory Visit: Payer: Self-pay

## 2020-03-04 DIAGNOSIS — D473 Essential (hemorrhagic) thrombocythemia: Secondary | ICD-10-CM | POA: Diagnosis not present

## 2020-03-04 DIAGNOSIS — Z79899 Other long term (current) drug therapy: Secondary | ICD-10-CM | POA: Diagnosis not present

## 2020-03-04 DIAGNOSIS — C7A012 Malignant carcinoid tumor of the ileum: Secondary | ICD-10-CM | POA: Insufficient documentation

## 2020-03-04 DIAGNOSIS — C7B02 Secondary carcinoid tumors of liver: Secondary | ICD-10-CM | POA: Diagnosis not present

## 2020-03-04 DIAGNOSIS — D45 Polycythemia vera: Secondary | ICD-10-CM

## 2020-03-04 MED ORDER — OCTREOTIDE ACETATE 30 MG IM KIT
30.0000 mg | PACK | INTRAMUSCULAR | Status: DC
  Administered 2020-03-04: 30 mg via INTRAMUSCULAR
  Filled 2020-03-04: qty 1

## 2020-03-18 ENCOUNTER — Encounter (INDEPENDENT_AMBULATORY_CARE_PROVIDER_SITE_OTHER): Payer: Medicare PPO | Admitting: Vascular Surgery

## 2020-04-01 ENCOUNTER — Inpatient Hospital Stay: Payer: Medicare PPO | Attending: Oncology

## 2020-04-01 ENCOUNTER — Other Ambulatory Visit: Payer: Self-pay

## 2020-04-01 DIAGNOSIS — C7B02 Secondary carcinoid tumors of liver: Secondary | ICD-10-CM | POA: Diagnosis not present

## 2020-04-01 DIAGNOSIS — D473 Essential (hemorrhagic) thrombocythemia: Secondary | ICD-10-CM | POA: Diagnosis not present

## 2020-04-01 DIAGNOSIS — D45 Polycythemia vera: Secondary | ICD-10-CM

## 2020-04-01 DIAGNOSIS — C7A012 Malignant carcinoid tumor of the ileum: Secondary | ICD-10-CM | POA: Insufficient documentation

## 2020-04-01 MED ORDER — OCTREOTIDE ACETATE 30 MG IM KIT
30.0000 mg | PACK | INTRAMUSCULAR | Status: DC
  Administered 2020-04-01: 30 mg via INTRAMUSCULAR
  Filled 2020-04-01: qty 1

## 2020-04-04 ENCOUNTER — Other Ambulatory Visit (INDEPENDENT_AMBULATORY_CARE_PROVIDER_SITE_OTHER): Payer: Self-pay | Admitting: Vascular Surgery

## 2020-04-04 ENCOUNTER — Other Ambulatory Visit (INDEPENDENT_AMBULATORY_CARE_PROVIDER_SITE_OTHER): Payer: Medicare PPO

## 2020-04-04 ENCOUNTER — Other Ambulatory Visit: Payer: Self-pay

## 2020-04-04 ENCOUNTER — Telehealth: Payer: Self-pay

## 2020-04-04 ENCOUNTER — Ambulatory Visit (INDEPENDENT_AMBULATORY_CARE_PROVIDER_SITE_OTHER): Payer: Medicare PPO | Admitting: Vascular Surgery

## 2020-04-04 ENCOUNTER — Encounter (INDEPENDENT_AMBULATORY_CARE_PROVIDER_SITE_OTHER): Payer: Self-pay | Admitting: Vascular Surgery

## 2020-04-04 VITALS — BP 142/75 | HR 59 | Ht 63.0 in | Wt 94.0 lb

## 2020-04-04 DIAGNOSIS — R0989 Other specified symptoms and signs involving the circulatory and respiratory systems: Secondary | ICD-10-CM | POA: Diagnosis not present

## 2020-04-04 DIAGNOSIS — I10 Essential (primary) hypertension: Secondary | ICD-10-CM

## 2020-04-04 DIAGNOSIS — E7849 Other hyperlipidemia: Secondary | ICD-10-CM

## 2020-04-04 DIAGNOSIS — M79604 Pain in right leg: Secondary | ICD-10-CM

## 2020-04-04 DIAGNOSIS — M79605 Pain in left leg: Secondary | ICD-10-CM

## 2020-04-04 DIAGNOSIS — I25118 Atherosclerotic heart disease of native coronary artery with other forms of angina pectoris: Secondary | ICD-10-CM

## 2020-04-04 DIAGNOSIS — I739 Peripheral vascular disease, unspecified: Secondary | ICD-10-CM | POA: Diagnosis not present

## 2020-04-04 NOTE — Progress Notes (Signed)
MRN : [id number]  [name] is a 86 y.o. (02-25-1932) female who presents with chief complaint of  Chief Complaint  Patient presents with  . New Patient (Initial Visit)    White. PVD  .  History of Present Illness:   The patient is seen for evaluation of painful lower extremities. Patient notes the pain is variable and not always associated with activity.  The pain is somewhat consistent day to day occurring on most days. The patient notes the pain also occurs with standing and routinely seems worse as the day wears on. The pain has been progressive over the past several years. The patient states these symptoms are causing  a profound negative impact on quality of life and daily activities.  The patient denies rest pain or dangling of an extremity off the side of the bed during the night for relief. No open wounds or sores at this time. No history of DVT or phlebitis. No prior interventions or surgeries.  There is a  history of back problems and DJD of the lumbar and sacral spine as well as multiple joints.   ABI's are obtained STAT  They are normal bilaterally  Current Meds  Medication Sig  . acetaminophen (TYLENOL) 325 MG tablet Take 1-2 tablets (325-650 mg total) by mouth every 6 (six) hours as needed for mild pain (pain score 1-3 or temp > 100.5). (Patient taking differently: Take 650 mg by mouth 3 (three) times daily. )  . amLODipine (NORVASC) 5 MG tablet Take 1 tablet (5 mg total) by mouth daily.  . calcium-vitamin D (OSCAL WITH D) 500-200 MG-UNIT per tablet Take 1 tablet by mouth daily.   . carvedilol (COREG) 25 MG tablet Take 1 tablet (25 mg total) by mouth 2 (two) times daily with a meal. (Patient taking differently: Take 12.5 mg by mouth 2 (two) times daily with a meal. )  . clopidogrel (PLAVIX) 75 MG tablet TAKE 1 TABLET BY MOUTH DAILY  . fexofenadine (ALLEGRA ALLERGY) 180 MG tablet Take 1 tablet (180 mg total) by mouth daily.  . fluticasone (FLONASE) 50 MCG/ACT nasal  spray Place 2 sprays into the nose daily.  . hydroxyurea (HYDREA) 500 MG capsule Take 1 capsule (500 mg total) by mouth daily.  . insulin detemir (LEVEMIR) 100 unit/ml SOLN Inject 0.08 mLs (8 Units total) into the skin at bedtime. (Patient taking differently: Inject 12 Units into the skin at bedtime. )  . nitroGLYCERIN (NITRODUR - DOSED IN MG/24 HR) 0.1 mg/hr patch Place 0.1 mg onto the skin daily.   . pantoprazole (PROTONIX) 40 MG tablet Take 1 tablet (40 mg total) by mouth daily.  . pravastatin (PRAVACHOL) 20 MG tablet Take 1 tablet (20 mg total) by mouth at bedtime.  Marland Kitchen QUEtiapine (SEROQUEL) 50 MG tablet Take 50 mg by mouth at bedtime.   . ramipril (ALTACE) 5 MG capsule Take 5 mg by mouth 2 (two) times daily.  . sertraline (ZOLOFT) 50 MG tablet Take 1 tablet (50 mg total) by mouth daily.  . traMADol (ULTRAM) 50 MG tablet 25 mg every other day.   . vitamin B-12 1000 MCG tablet Take 1 tablet (1,000 mcg total) by mouth daily.    Past Medical History:  Diagnosis Date  . Adenocarcinoma in situ of cervix   . Arthritis    hands  . Breast cancer ([place])   . Colon cancer ([place])   . Diabetes mellitus without complication ([place])   . Endometrial carcinoma ([place])  s/p total abdominal hysterectomy  . H/O compression fracture of spine 2014   thoracic spine  . H/O polycythemia vera   . H/O TIA (transient ischemic attack) and stroke 09/2014, 03/2015   No deficits  . Hypertension   . Hyperuricemia   . Microalbuminuria   . Myocardial infarction ([place])   . Polycythemia vera ([place])   . Recurrent falls   . Skin cancer    face, legs  . Stroke [facility]) 2008   no deficits  . Trochanteric bursitis   . Varicose veins    treated    Past Surgical History:  Procedure Laterality Date  . ABDOMINAL HYSTERECTOMY    . BOWEL RESECTION N/A 03/28/2015   Procedure: SMALL BOWEL RESECTION;  Surgeon: Leonie Green, MD;  Location: [facility];  Service: General;  Laterality: N/A;  . CATARACT EXTRACTION W/  INTRAOCULAR LENS IMPLANT Right   . CATARACT EXTRACTION W/PHACO Left 10/07/2015   Procedure: CATARACT EXTRACTION PHACO AND INTRAOCULAR LENS PLACEMENT (IOC);  Surgeon: Ronnell Freshwater, MD;  Location: [place];  Service: Ophthalmology;  Laterality: Left;  DIABETIC - oral meds VISION BLUE  . CORONARY ANGIOGRAPHY N/A 10/29/2017   Procedure: CORONARY ANGIOGRAPHY;  Surgeon: Dionisio David, MD;  Location: [place] [facility];  Service: Cardiovascular;  Laterality: N/A;  . EXPLORATORY LAPAROTOMY     for fibroids  . HIP ARTHROPLASTY Left 12/01/2018   Procedure: ARTHROPLASTY BIPOLAR HIP (HEMIARTHROPLASTY);  Surgeon: Corky Mull, MD;  Location: [facility];  Service: Orthopedics;  Laterality: Left;  . LEFT HEART CATH Right 10/29/2017   Procedure: Left Heart Cath;  Surgeon: Dionisio David, MD;  Location: [place] [facility];  Service: Cardiovascular;  Laterality: Right;  . TONSILLECTOMY      Social History Social History   Tobacco Use  . Smoking status: Former Research scientist (life sciences)  . Smokeless tobacco: Never Used  Vaping Use  . Vaping Use: Never used  Substance Use Topics  . Alcohol use: No  . Drug use: No    Family History Family History  Problem Relation Age of Onset  . Cancer Brother        AML  . Heart attack Mother   . Breast cancer Mother   . Heart attack Father   . Diabetes Father   . Heart attack Sister   . Diabetes Sister   No family history of bleeding/clotting disorders, porphyria or autoimmune disease   Allergies  Allergen Reactions  . Iodine Other (See Comments)    ALL OPIOIDS   . Other Other (See Comments)    ALL OPIOIDS   . Simvastatin Other (See Comments)    Myalgia     REVIEW OF SYSTEMS (Negative unless checked)  Constitutional: [] Weight loss  [] Fever  [] Chills Cardiac: [] Chest pain   [] Chest pressure   [] Palpitations   [] Shortness of breath when laying flat   [] Shortness of breath with exertion. Vascular:  [] Pain in legs with walking   [] Pain in legs  at rest  [] History of DVT   [] Phlebitis   [] Swelling in legs   [] Varicose veins   [] Non-healing ulcers Pulmonary:   [] Uses home oxygen   [] Productive cough   [] Hemoptysis   [] Wheeze  [] COPD   [] Asthma Neurologic:  [] Dizziness   [] Seizures   [] History of stroke   [] History of TIA  [] Aphasia   [] Vissual changes   [] Weakness or numbness in arm   [] Weakness or numbness in leg Musculoskeletal:   [] Joint swelling   [x] Joint pain   [  x]Low back pain Hematologic:  [] Easy bruising  [] Easy bleeding   [] Hypercoagulable state   [] Anemic Gastrointestinal:  [] Diarrhea   [] Vomiting  [] Gastroesophageal reflux/heartburn   [] Difficulty swallowing. Genitourinary:  [] Chronic kidney disease   [] Difficult urination  [] Frequent urination   [] Blood in urine Skin:  [] Rashes   [] Ulcers  Psychological:  [] History of anxiety   []  History of major depression.  Physical Examination  Vitals:   04/04/20 1311  BP: (!) 142/75  Pulse: (!) 59  Weight: 94 lb (42.6 kg)  Height: 5\' 3"  (1.6 m)   Body mass index is 16.65 kg/m. Gen: WD/WN, NAD Frail using a walker Head: [place]/AT, No temporalis wasting.  Ear/Nose/Throat: Hearing grossly intact, nares w/o erythema or drainage, poor dentition Eyes: PER, EOMI, sclera nonicteric.  Neck: Supple, no masses.  No bruit or JVD.  Pulmonary:  Good air movement, clear to auscultation bilaterally, no use of accessory muscles.  Cardiac: RRR, normal S1, S2, no Murmurs. Vascular: scattered varicosities present bilaterally.  Mild venous stasis changes to the legs bilaterally.  Trace soft pitting edema Vessel Right Left  Radial Palpable Palpable  PT Not Palpable Not Palpable  DP Trace Palpable Trace Palpable  Gastrointestinal: soft, non-distended. No guarding/no peritoneal signs.  Musculoskeletal: M/S 5/5 throughout.  No deformity c/w diffuse DJD mild atrophy.  Neurologic: CN 2-12 intact. Pain and light touch intact in extremities.  Symmetrical.  Speech is fluent. Motor exam as listed  above. Psychiatric: Judgment intact, Mood & affect appropriate for pt's clinical situation. Dermatologic: No rashes or ulcers noted.  No changes consistent with cellulitis.   CBC Lab Results  Component Value Date   WBC 9.0 02/02/2020   HGB 12.5 02/02/2020   HCT 37.3 02/02/2020   MCV 115.1 (H) 02/02/2020   PLT 488 (H) 02/02/2020    BMET    Component Value Date/Time   NA 139 02/02/2020 1405   NA 138 02/14/2014 1409   K 4.4 02/02/2020 1405   K 4.3 01/17/2014 1514   CL 98 02/02/2020 1405   CL 99 01/17/2014 1514   CO2 27 02/02/2020 1405   CO2 27 01/17/2014 1514   GLUCOSE 231 (H) 02/02/2020 1405   GLUCOSE 164 (H) 01/17/2014 1514   BUN 27 (H) 02/02/2020 1405   BUN 25 (H) 01/17/2014 1514   CREATININE 1.10 (H) 02/02/2020 1405   CREATININE 1.26 01/17/2014 1514   CALCIUM 8.6 (L) 02/02/2020 1405   CALCIUM 9.5 01/17/2014 1514   GFRNONAA 46 (L) 02/02/2020 1405   GFRNONAA 40 (L) 01/17/2014 1514   GFRAA 53 (L) 02/02/2020 1405   GFRAA 47 (L) 01/17/2014 1514   CrCl cannot be calculated (Patient's most recent lab result is older than the maximum 21 days allowed.).  COAG Lab Results  Component Value Date   INR 1.2 11/30/2018   INR 1.06 10/29/2017   INR 1.06 12/20/2014    Radiology No results found.   Assessment/Plan 1. Pain in both lower extremities Recommend:  I do not find evidence of life style limiting vascular disease. The patient specifically denies life style limitation.  Previous noninvasive studies including ABI's of the legs do not identify critical vascular problems.  The patient should continue walking and begin a more formal exercise program. The patient should continue his antiplatelet therapy and aggressive treatment of the lipid abnormalities.  The patient should begin wearing graduated compression socks 15-20 mmHg strength to control her mild edema.  Patient will follow-up with me on a PRN basis   2. PAD (peripheral artery  disease)  ([place]) Recommend:  I do not find evidence of life style limiting vascular disease. The patient specifically denies life style limitation.  Previous noninvasive studies including ABI's of the legs do not identify critical vascular problems.  The patient should continue walking and begin a more formal exercise program. The patient should continue his antiplatelet therapy and aggressive treatment of the lipid abnormalities.  The patient should begin wearing graduated compression socks 15-20 mmHg strength to control her mild edema.  Patient will follow-up with me on a PRN basis   3. Coronary artery disease of native artery of native heart with stable angina pectoris (HCC) Continue cardiac and antihypertensive medications as already ordered and reviewed, no changes at this time.  Continue statin as ordered and reviewed, no changes at this time  Nitrates PRN for chest pain   4. Essential hypertension Continue antihypertensive medications as already ordered, these medications have been reviewed and there are no changes at this time.   5. Other hyperlipidemia Continue statin as ordered and reviewed, no changes at this time     Hortencia Pilar, MD  04/04/2020 1:17 PM

## 2020-04-04 NOTE — Telephone Encounter (Signed)
Volunteer check in call made for palliative care: No answer 

## 2020-04-05 ENCOUNTER — Encounter (INDEPENDENT_AMBULATORY_CARE_PROVIDER_SITE_OTHER): Payer: Self-pay | Admitting: Vascular Surgery

## 2020-04-05 DIAGNOSIS — M79606 Pain in leg, unspecified: Secondary | ICD-10-CM | POA: Insufficient documentation

## 2020-04-05 DIAGNOSIS — I739 Peripheral vascular disease, unspecified: Secondary | ICD-10-CM | POA: Insufficient documentation

## 2020-04-24 ENCOUNTER — Telehealth: Payer: Self-pay

## 2020-04-24 NOTE — Telephone Encounter (Signed)
Volunteer call to check in on patient. No answer

## 2020-05-06 ENCOUNTER — Inpatient Hospital Stay: Payer: Medicare PPO

## 2020-05-06 ENCOUNTER — Inpatient Hospital Stay: Payer: Medicare PPO | Attending: Oncology

## 2020-05-06 ENCOUNTER — Inpatient Hospital Stay (HOSPITAL_BASED_OUTPATIENT_CLINIC_OR_DEPARTMENT_OTHER): Payer: Medicare PPO | Admitting: Oncology

## 2020-05-06 ENCOUNTER — Encounter: Payer: Self-pay | Admitting: Oncology

## 2020-05-06 DIAGNOSIS — D473 Essential (hemorrhagic) thrombocythemia: Secondary | ICD-10-CM

## 2020-05-06 DIAGNOSIS — Z794 Long term (current) use of insulin: Secondary | ICD-10-CM | POA: Insufficient documentation

## 2020-05-06 DIAGNOSIS — Z803 Family history of malignant neoplasm of breast: Secondary | ICD-10-CM | POA: Insufficient documentation

## 2020-05-06 DIAGNOSIS — Z806 Family history of leukemia: Secondary | ICD-10-CM | POA: Diagnosis not present

## 2020-05-06 DIAGNOSIS — Z853 Personal history of malignant neoplasm of breast: Secondary | ICD-10-CM | POA: Insufficient documentation

## 2020-05-06 DIAGNOSIS — D3A029 Benign carcinoid tumor of the large intestine, unspecified portion: Secondary | ICD-10-CM

## 2020-05-06 DIAGNOSIS — Z90710 Acquired absence of both cervix and uterus: Secondary | ICD-10-CM | POA: Diagnosis not present

## 2020-05-06 DIAGNOSIS — Z85828 Personal history of other malignant neoplasm of skin: Secondary | ICD-10-CM | POA: Insufficient documentation

## 2020-05-06 DIAGNOSIS — E119 Type 2 diabetes mellitus without complications: Secondary | ICD-10-CM | POA: Insufficient documentation

## 2020-05-06 DIAGNOSIS — Z79899 Other long term (current) drug therapy: Secondary | ICD-10-CM | POA: Insufficient documentation

## 2020-05-06 DIAGNOSIS — C7A012 Malignant carcinoid tumor of the ileum: Secondary | ICD-10-CM | POA: Diagnosis not present

## 2020-05-06 DIAGNOSIS — Z833 Family history of diabetes mellitus: Secondary | ICD-10-CM | POA: Diagnosis not present

## 2020-05-06 DIAGNOSIS — Z8249 Family history of ischemic heart disease and other diseases of the circulatory system: Secondary | ICD-10-CM | POA: Diagnosis not present

## 2020-05-06 DIAGNOSIS — Z8542 Personal history of malignant neoplasm of other parts of uterus: Secondary | ICD-10-CM | POA: Insufficient documentation

## 2020-05-06 DIAGNOSIS — D45 Polycythemia vera: Secondary | ICD-10-CM

## 2020-05-06 DIAGNOSIS — Z8673 Personal history of transient ischemic attack (TIA), and cerebral infarction without residual deficits: Secondary | ICD-10-CM | POA: Insufficient documentation

## 2020-05-06 DIAGNOSIS — I252 Old myocardial infarction: Secondary | ICD-10-CM | POA: Diagnosis not present

## 2020-05-06 DIAGNOSIS — C7B02 Secondary carcinoid tumors of liver: Secondary | ICD-10-CM | POA: Insufficient documentation

## 2020-05-06 DIAGNOSIS — I10 Essential (primary) hypertension: Secondary | ICD-10-CM | POA: Diagnosis not present

## 2020-05-06 DIAGNOSIS — Z5181 Encounter for therapeutic drug level monitoring: Secondary | ICD-10-CM | POA: Diagnosis not present

## 2020-05-06 DIAGNOSIS — Z9049 Acquired absence of other specified parts of digestive tract: Secondary | ICD-10-CM | POA: Insufficient documentation

## 2020-05-06 DIAGNOSIS — Z87891 Personal history of nicotine dependence: Secondary | ICD-10-CM | POA: Insufficient documentation

## 2020-05-06 LAB — COMPREHENSIVE METABOLIC PANEL
ALT: 34 U/L (ref 0–44)
AST: 32 U/L (ref 15–41)
Albumin: 3.7 g/dL (ref 3.5–5.0)
Alkaline Phosphatase: 66 U/L (ref 38–126)
Anion gap: 6 (ref 5–15)
BUN: 33 mg/dL — ABNORMAL HIGH (ref 8–23)
CO2: 31 mmol/L (ref 22–32)
Calcium: 8.9 mg/dL (ref 8.9–10.3)
Chloride: 99 mmol/L (ref 98–111)
Creatinine, Ser: 1.23 mg/dL — ABNORMAL HIGH (ref 0.44–1.00)
GFR, Estimated: 43 mL/min — ABNORMAL LOW (ref >60)
Glucose, Bld: 246 mg/dL — ABNORMAL HIGH (ref 70–99)
Potassium: 4.4 mmol/L (ref 3.5–5.1)
Sodium: 136 mmol/L (ref 135–145)
Total Bilirubin: 0.7 mg/dL (ref 0.3–1.2)
Total Protein: 6.4 g/dL — ABNORMAL LOW (ref 6.5–8.1)

## 2020-05-06 LAB — CBC WITH DIFFERENTIAL/PLATELET
Abs Immature Granulocytes: 0.18 10*3/uL — ABNORMAL HIGH (ref 0.00–0.07)
Basophils Absolute: 0.2 10*3/uL — ABNORMAL HIGH (ref 0.0–0.1)
Basophils Relative: 1 %
Eosinophils Absolute: 0.2 10*3/uL (ref 0.0–0.5)
Eosinophils Relative: 2 %
HCT: 41.3 % (ref 36.0–46.0)
Hemoglobin: 13.7 g/dL (ref 12.0–15.0)
Immature Granulocytes: 1 %
Lymphocytes Relative: 5 %
Lymphs Abs: 0.9 10*3/uL (ref 0.7–4.0)
MCH: 35.7 pg — ABNORMAL HIGH (ref 26.0–34.0)
MCHC: 33.2 g/dL (ref 30.0–36.0)
MCV: 107.6 fL — ABNORMAL HIGH (ref 80.0–100.0)
Monocytes Absolute: 0.4 10*3/uL (ref 0.1–1.0)
Monocytes Relative: 3 %
Neutro Abs: 14.2 10*3/uL — ABNORMAL HIGH (ref 1.7–7.7)
Neutrophils Relative %: 88 %
Platelets: 631 10*3/uL — ABNORMAL HIGH (ref 150–400)
RBC: 3.84 MIL/uL — ABNORMAL LOW (ref 3.87–5.11)
RDW: 16.9 % — ABNORMAL HIGH (ref 11.5–15.5)
WBC: 16 10*3/uL — ABNORMAL HIGH (ref 4.0–10.5)
nRBC: 0 % (ref 0.0–0.2)

## 2020-05-06 MED ORDER — OCTREOTIDE ACETATE 30 MG IM KIT
30.0000 mg | PACK | INTRAMUSCULAR | Status: AC
  Administered 2020-05-06: 30 mg via INTRAMUSCULAR
  Filled 2020-05-06: qty 1

## 2020-05-06 NOTE — Progress Notes (Signed)
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 Mike Gip for polycythemia vera/ETand she was on Hydrea for the same. Her last visit with her as an outpatient was in May 2020. She was taking hydroxyurea 500 mg 5 times a week. 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 Tumor was 2.5 cm with 9+ lymph nodes pathologic stage was T3N1. Her chromogranin levels since then have been monitored regularly and they have been chronically elevated between 200-2 50. Patient was noted to have liver lesions but did not have any significant metabolic uptake on PET CT scan in November 2019. At her last visit with Dr. Mike Gip in May 2020 plan was to get dotatate PET scan followed by consideration for liver biopsy and at that time patient was lost to follow-up  Dotatate scan in February 2021 showed multiple areas ofLiver metastases with intense radiotracer activity consistent with well-differentiated metastatic neuroendocrine tumor. Single small peritoneal implant along ventral peritoneal surface. No other evidence of malignancy.Patient was started on octreotide in March 2021  Patient is also on Hydrea5times a week for her JAK2 positive ET. She has had significant pancytopenia with higher  doses of Hydrea in the past   Interval history-patient is here with her caregiver today.  She has 24-hour caregiver.  She had another fall about 2 weeks ago.  Caregiver reports that she eats well and has not had any recent diarrhea.  ECOG PS- 2 Pain scale- 0   Review of systems- Review of Systems  Constitutional: Positive for malaise/fatigue and weight loss. Negative for chills and fever.  HENT: Negative for congestion, ear discharge and nosebleeds.   Eyes: Negative for blurred vision.  Respiratory: Negative for cough, hemoptysis, sputum production, shortness of breath and wheezing.   Cardiovascular: Negative for chest pain, palpitations, orthopnea and claudication.  Gastrointestinal: Negative for abdominal pain, blood in stool, constipation, diarrhea, heartburn, melena, nausea and vomiting.  Genitourinary: Negative for dysuria, flank pain, frequency, hematuria and urgency.  Musculoskeletal: Negative for back pain, joint pain and myalgias.  Skin: Negative for rash.  Neurological: Negative for dizziness, tingling, focal weakness, seizures, weakness and headaches.  Endo/Heme/Allergies: Does not bruise/bleed easily.  Psychiatric/Behavioral: Negative for depression and suicidal ideas. The patient does not have insomnia.        Allergies  Allergen Reactions   Iodine Other (See Comments)    ALL OPIOIDS    Other Other (See Comments)    ALL OPIOIDS    Simvastatin Other (See Comments)    Myalgia     Past Medical History:  Diagnosis Date   Adenocarcinoma in situ of cervix    Arthritis    hands   Breast cancer ([place])    Colon cancer ([place])    Diabetes mellitus without complication ([place])    Endometrial carcinoma ([place])    s/p total abdominal hysterectomy   H/O compression fracture of spine 2014  thoracic spine   H/O polycythemia vera    H/O TIA (transient ischemic attack) and stroke 09/2014, 03/2015   No deficits   Hypertension    Hyperuricemia     Microalbuminuria    Myocardial infarction ([place])    Polycythemia vera ([place])    Recurrent falls    Skin cancer    face, legs   Stroke ([place]) 2008   no deficits   Trochanteric bursitis    Varicose veins    treated     Past Surgical History:  Procedure Laterality Date   ABDOMINAL HYSTERECTOMY     BOWEL RESECTION N/A 03/28/2015   Procedure: SMALL BOWEL RESECTION;  Surgeon: Leonie Green, MD;  Location: [facility];  Service: General;  Laterality: N/A;   CATARACT EXTRACTION W/ INTRAOCULAR LENS IMPLANT Right    CATARACT EXTRACTION W/PHACO Left 10/07/2015   Procedure: CATARACT EXTRACTION PHACO AND INTRAOCULAR LENS PLACEMENT ([place]);  Surgeon: Ronnell Freshwater, MD;  Location: [place];  Service: Ophthalmology;  Laterality: Left;  DIABETIC - oral meds VISION BLUE   CORONARY ANGIOGRAPHY N/A 10/29/2017   Procedure: CORONARY ANGIOGRAPHY;  Surgeon: Dionisio David, MD;  Location: [place] [facility];  Service: Cardiovascular;  Laterality: N/A;   EXPLORATORY LAPAROTOMY     for fibroids   HIP ARTHROPLASTY Left 12/01/2018   Procedure: ARTHROPLASTY BIPOLAR HIP (HEMIARTHROPLASTY);  Surgeon: Corky Mull, MD;  Location: [facility];  Service: Orthopedics;  Laterality: Left;   LEFT HEART CATH Right 10/29/2017   Procedure: Left Heart Cath;  Surgeon: Dionisio David, MD;  Location: [place] [facility];  Service: Cardiovascular;  Laterality: Right;   TONSILLECTOMY      Social History   Socioeconomic History   Marital status: Widowed    Spouse name: Not on file   Number of children: Not on file   Years of education: Not on file   Highest education level: Not on file  Occupational History   Not on file  Tobacco Use   Smoking status: Former Smoker   Smokeless tobacco: Never Used  Scientific laboratory technician Use: Never used  Substance and Sexual Activity   Alcohol use: No   Drug use: No   Sexual activity: Not Currently  Other Topics Concern   Not on file   Social History Narrative   Not on file   Social Determinants of Health   Financial Resource Strain:    Difficulty of Paying Living Expenses: Not on file  Food Insecurity:    Worried About [place] in the Last Year: Not on file   Ran Out of Food in the Last Year: Not on file  Transportation Needs:    Lack of Transportation (Medical): Not on file   Lack of Transportation (Non-Medical): Not on file  Physical Activity:    Days of Exercise per Week: Not on file   Minutes of Exercise per Session: Not on file  Stress:    Feeling of Stress : Not on file  Social Connections:    Frequency of Communication with Friends and Family: Not on file   Frequency of Social Gatherings with Friends and Family: Not on file   Attends Religious Services: Not on file   Active Member of Clubs or Organizations: Not on file   Attends Archivist Meetings: Not on file   Marital Status: Not on file  Intimate Partner Violence:    Fear of Current or Ex-Partner: Not on file   Emotionally Abused: Not  on file   Physically Abused: Not on file   Sexually Abused: Not on file    Family History  Problem Relation Age of Onset   Cancer Brother        AML   Heart attack Mother    Breast cancer Mother    Heart attack Father    Diabetes Father    Heart attack Sister    Diabetes Sister      Current Outpatient Medications:    acetaminophen (TYLENOL) 325 MG tablet, Take 1-2 tablets (325-650 mg total) by mouth every 6 (six) hours as needed for mild pain (pain score 1-3 or temp > 100.5). (Patient taking differently: Take 650 mg by mouth 3 (three) times daily. ), Disp:  , Rfl:    amLODipine (NORVASC) 5 MG tablet, Take 1 tablet (5 mg total) by mouth daily., Disp: 30 tablet, Rfl: 1   calcium-vitamin D (OSCAL WITH D) 500-200 MG-UNIT per tablet, Take 1 tablet by mouth daily. , Disp: , Rfl:    carvedilol (COREG) 25 MG tablet, Take 1 tablet (25 mg total) by mouth 2 (two)  times daily with a meal. (Patient taking differently: Take 12.5 mg by mouth 2 (two) times daily with a meal. ), Disp: 60 tablet, Rfl: 0   clopidogrel (PLAVIX) 75 MG tablet, TAKE 1 TABLET BY MOUTH DAILY, Disp: 30 tablet, Rfl: 0   fexofenadine (ALLEGRA ALLERGY) 180 MG tablet, Take 1 tablet (180 mg total) by mouth daily., Disp: , Rfl:    fluticasone (FLONASE) 50 MCG/ACT nasal spray, Place 2 sprays into the nose daily., Disp: , Rfl:    hydroxyurea (HYDREA) 500 MG capsule, Take 1 capsule (500 mg total) by mouth daily., Disp: 30 capsule, Rfl: 3   insulin detemir (LEVEMIR) 100 unit/ml SOLN, Inject 0.08 mLs (8 Units total) into the skin at bedtime. (Patient taking differently: Inject 12 Units into the skin at bedtime. ), Disp: 8 mL, Rfl: 1   nitroGLYCERIN (NITRODUR - DOSED IN MG/24 HR) 0.1 mg/hr patch, Place 0.1 mg onto the skin daily. , Disp: , Rfl:    pantoprazole (PROTONIX) 40 MG tablet, Take 1 tablet (40 mg total) by mouth daily., Disp: 30 tablet, Rfl: 0   pravastatin (PRAVACHOL) 20 MG tablet, Take 1 tablet (20 mg total) by mouth at bedtime., Disp: 30 tablet, Rfl: 0   QUEtiapine (SEROQUEL) 50 MG tablet, Take 50 mg by mouth at bedtime. , Disp: , Rfl:    ramipril (ALTACE) 5 MG capsule, Take 5 mg by mouth 2 (two) times daily., Disp: , Rfl:    sertraline (ZOLOFT) 50 MG tablet, Take 1 tablet (50 mg total) by mouth daily., Disp: 30 tablet, Rfl: 4   spironolactone (ALDACTONE) 25 MG tablet, Take 0.5 tablets (12.5 mg total) by mouth every other day for 30 days. (Patient taking differently: Take 12.5 mg by mouth See admin instructions. Take 12.5 mg by mouth twice daily every other day), Disp: 7.5 tablet, Rfl: 0   traMADol (ULTRAM) 50 MG tablet, 25 mg every other day. , Disp: , Rfl:    vitamin B-12 1000 MCG tablet, Take 1 tablet (1,000 mcg total) by mouth daily., Disp: 30 tablet, Rfl: 1 No current facility-administered medications for this visit.  Facility-Administered Medications Ordered in Other  Visits:    octreotide (SANDOSTATIN LAR) IM injection 30 mg, 30 mg, Intramuscular, Q30 days, Sindy Guadeloupe, MD, 30 mg at 10/30/19 1134   octreotide (SANDOSTATIN LAR) IM injection 30 mg, 30 mg, Intramuscular, Q30 days, Sindy Guadeloupe,  MD, 30 mg at 12/29/19 1044   octreotide (SANDOSTATIN LAR) IM injection 30 mg, 30 mg, Intramuscular, Q30 days, Sindy Guadeloupe, MD, 30 mg at 02/02/20 1512  Physical exam:  Vitals:   05/06/20 1519  BP: 117/70  Pulse: (!) 58  Resp: 16  Temp: (!) 97.2 F (36.2 C)  TempSrc: Tympanic  SpO2: 98%  Weight: 95 lb (43.1 kg)   Physical Exam Constitutional:      Comments: Thin elderly cachectic lady sitting in a wheelchair.  Appears in no acute distress  Cardiovascular:     Rate and Rhythm: Normal rate and regular rhythm.     Heart sounds: Normal heart sounds.  Pulmonary:     Effort: Pulmonary effort is normal.     Breath sounds: Normal breath sounds.  Abdominal:     General: Bowel sounds are normal.     Palpations: Abdomen is soft.  Skin:    General: Skin is warm and dry.  Neurological:     Mental Status: She is alert and oriented to person, place, and time.      CMP Latest Ref Rng & Units 02/02/2020  Glucose 70 - 99 mg/dL 231(H)  BUN 8 - 23 mg/dL 27(H)  Creatinine 0.44 - 1.00 mg/dL 1.10(H)  Sodium 135 - 145 mmol/L 139  Potassium 3.5 - 5.1 mmol/L 4.4  Chloride 98 - 111 mmol/L 98  CO2 22 - 32 mmol/L 27  Calcium 8.9 - 10.3 mg/dL 8.6(L)  Total Protein 6.5 - 8.1 g/dL 6.1(L)  Total Bilirubin 0.3 - 1.2 mg/dL 0.8  Alkaline Phos 38 - 126 U/L 48  AST 15 - 41 U/L 17  ALT 0 - 44 U/L 15   CBC Latest Ref Rng & Units 05/06/2020  WBC 4.0 - 10.5 K/uL 16.0(H)  Hemoglobin 12.0 - 15.0 g/dL 13.7  Hematocrit 36 - 46 % 41.3  Platelets 150 - 400 K/uL 631(H)      Assessment and plan- Patient is a 86 y.o. female who is here for follow-up of following issues:  1.  JAK2 positive essential thrombocytosis: Platelet count is higher today at 631 with a white count of  16.  They are not at goal less than 400.  Hemoglobin is normal at 13.7.  We will have her increase her Hydrea to 6 times a week.  2.  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  She will continue to take octreotide monthly.  I will repeat her next scan in 3 month and see her thereafter   Visit Diagnosis 1. Carcinoid tumor of colon   2. High risk medication use   3. Essential thrombocytosis ([place])   4. Encounter for monitoring octreotide therapy      Dr. Randa Evens, MD, MPH [facility] [phone] 05/06/2020 3:20 PM

## 2020-05-06 NOTE — Addendum Note (Signed)
Addended by: Kern Alberta on: 05/06/2020 04:14 PM   Modules accepted: Orders

## 2020-05-07 ENCOUNTER — Encounter: Payer: Self-pay | Admitting: Oncology

## 2020-05-07 MED ORDER — HYDROXYUREA 500 MG PO CAPS: 500.0000 mg | ORAL_CAPSULE | Freq: Every day | ORAL | 3 refills | Status: DC

## 2020-05-07 NOTE — Addendum Note (Signed)
Addended by: Kern Alberta on: 05/07/2020 01:51 PM   Modules accepted: Orders

## 2020-05-08 LAB — CHROMOGRANIN A: Chromogranin A (ng/mL): 266.3 ng/mL — ABNORMAL HIGH (ref 0.0–101.8)

## 2020-06-05 ENCOUNTER — Inpatient Hospital Stay: Payer: Medicare PPO | Attending: Oncology

## 2020-06-05 ENCOUNTER — Other Ambulatory Visit: Payer: Self-pay

## 2020-06-05 DIAGNOSIS — C7A012 Malignant carcinoid tumor of the ileum: Secondary | ICD-10-CM | POA: Insufficient documentation

## 2020-06-05 DIAGNOSIS — C7B02 Secondary carcinoid tumors of liver: Secondary | ICD-10-CM | POA: Insufficient documentation

## 2020-06-05 DIAGNOSIS — D45 Polycythemia vera: Secondary | ICD-10-CM

## 2020-06-05 DIAGNOSIS — D473 Essential (hemorrhagic) thrombocythemia: Secondary | ICD-10-CM | POA: Insufficient documentation

## 2020-06-05 MED ORDER — OCTREOTIDE ACETATE 30 MG IM KIT
30.0000 mg | PACK | INTRAMUSCULAR | Status: DC
  Administered 2020-06-05: 15:00:00 30 mg via INTRAMUSCULAR
  Filled 2020-06-05: qty 1

## 2020-06-07 ENCOUNTER — Telehealth: Payer: Self-pay

## 2020-06-07 NOTE — Telephone Encounter (Signed)
Volunteer called patient on behalf of Palliative Care and patient is doing well.  

## 2020-07-03 ENCOUNTER — Telehealth: Payer: Self-pay | Admitting: *Deleted

## 2020-07-03 ENCOUNTER — Other Ambulatory Visit: Payer: Self-pay | Admitting: *Deleted

## 2020-07-03 ENCOUNTER — Other Ambulatory Visit: Payer: Self-pay

## 2020-07-03 ENCOUNTER — Ambulatory Visit
Admission: RE | Admit: 2020-07-03 | Discharge: 2020-07-03 | Disposition: A | Discharge status: Home / Self Care | Payer: Medicare PPO | Source: Ambulatory Visit | Attending: Oncology | Admitting: Oncology

## 2020-07-03 ENCOUNTER — Other Ambulatory Visit: Payer: Self-pay | Admitting: Oncology

## 2020-07-03 DIAGNOSIS — D3A029 Benign carcinoid tumor of the large intestine, unspecified portion: Secondary | ICD-10-CM

## 2020-07-03 DIAGNOSIS — Z5181 Encounter for therapeutic drug level monitoring: Secondary | ICD-10-CM | POA: Diagnosis present

## 2020-07-03 DIAGNOSIS — Z79899 Other long term (current) drug therapy: Secondary | ICD-10-CM

## 2020-07-03 DIAGNOSIS — D473 Essential (hemorrhagic) thrombocythemia: Secondary | ICD-10-CM | POA: Diagnosis present

## 2020-07-03 DIAGNOSIS — R7989 Other specified abnormal findings of blood chemistry: Secondary | ICD-10-CM

## 2020-07-03 LAB — POCT I-STAT CREATININE: Creatinine, Ser: 2.6 mg/dL — ABNORMAL HIGH (ref 0.44–1.00)

## 2020-07-03 NOTE — Telephone Encounter (Signed)
Called the pt's home and the caregiver answered and she had spoke to [place] earlier today and she will bring pt to cancer center on Friday at 2:45 and the lab was entered for it and she can drink and eat for the lab. Caregiver agreeable. syv

## 2020-07-05 ENCOUNTER — Inpatient Hospital Stay: Payer: Medicare PPO | Attending: Oncology

## 2020-07-05 DIAGNOSIS — C7B02 Secondary carcinoid tumors of liver: Secondary | ICD-10-CM | POA: Insufficient documentation

## 2020-07-05 DIAGNOSIS — D473 Essential (hemorrhagic) thrombocythemia: Secondary | ICD-10-CM | POA: Diagnosis not present

## 2020-07-05 DIAGNOSIS — Z79899 Other long term (current) drug therapy: Secondary | ICD-10-CM

## 2020-07-05 DIAGNOSIS — C7A012 Malignant carcinoid tumor of the ileum: Secondary | ICD-10-CM | POA: Diagnosis present

## 2020-07-05 DIAGNOSIS — R7989 Other specified abnormal findings of blood chemistry: Secondary | ICD-10-CM

## 2020-07-05 LAB — CBC WITH DIFFERENTIAL/PLATELET
Abs Immature Granulocytes: 0.16 10*3/uL — ABNORMAL HIGH (ref 0.00–0.07)
Basophils Absolute: 0.2 10*3/uL — ABNORMAL HIGH (ref 0.0–0.1)
Basophils Relative: 1 %
Eosinophils Absolute: 0.3 10*3/uL (ref 0.0–0.5)
Eosinophils Relative: 2 %
HCT: 40.4 % (ref 36.0–46.0)
Hemoglobin: 13.5 g/dL (ref 12.0–15.0)
Immature Granulocytes: 1 %
Lymphocytes Relative: 6 %
Lymphs Abs: 1.1 10*3/uL (ref 0.7–4.0)
MCH: 36.8 pg — ABNORMAL HIGH (ref 26.0–34.0)
MCHC: 33.4 g/dL (ref 30.0–36.0)
MCV: 110.1 fL — ABNORMAL HIGH (ref 80.0–100.0)
Monocytes Absolute: 0.6 10*3/uL (ref 0.1–1.0)
Monocytes Relative: 3 %
Neutro Abs: 16.8 10*3/uL — ABNORMAL HIGH (ref 1.7–7.7)
Neutrophils Relative %: 87 %
Platelets: 515 10*3/uL — ABNORMAL HIGH (ref 150–400)
RBC: 3.67 MIL/uL — ABNORMAL LOW (ref 3.87–5.11)
RDW: 17.9 % — ABNORMAL HIGH (ref 11.5–15.5)
WBC: 19.2 10*3/uL — ABNORMAL HIGH (ref 4.0–10.5)
nRBC: 0 % (ref 0.0–0.2)

## 2020-07-05 LAB — BASIC METABOLIC PANEL
Anion gap: 11 (ref 5–15)
BUN: 27 mg/dL — ABNORMAL HIGH (ref 8–23)
CO2: 29 mmol/L (ref 22–32)
Calcium: 8.3 mg/dL — ABNORMAL LOW (ref 8.9–10.3)
Chloride: 100 mmol/L (ref 98–111)
Creatinine, Ser: 1.22 mg/dL — ABNORMAL HIGH (ref 0.44–1.00)
GFR, Estimated: 43 mL/min — ABNORMAL LOW (ref >60)
Glucose, Bld: 219 mg/dL — ABNORMAL HIGH (ref 70–99)
Potassium: 3.8 mmol/L (ref 3.5–5.1)
Sodium: 140 mmol/L (ref 135–145)

## 2020-07-10 ENCOUNTER — Inpatient Hospital Stay: Payer: Medicare PPO

## 2020-07-10 ENCOUNTER — Other Ambulatory Visit: Payer: Self-pay

## 2020-07-10 DIAGNOSIS — C7A012 Malignant carcinoid tumor of the ileum: Secondary | ICD-10-CM | POA: Diagnosis not present

## 2020-07-10 DIAGNOSIS — D45 Polycythemia vera: Secondary | ICD-10-CM

## 2020-07-10 DIAGNOSIS — Z79899 Other long term (current) drug therapy: Secondary | ICD-10-CM

## 2020-07-10 DIAGNOSIS — D473 Essential (hemorrhagic) thrombocythemia: Secondary | ICD-10-CM

## 2020-07-10 LAB — CBC WITH DIFFERENTIAL/PLATELET
Abs Immature Granulocytes: 0.35 10*3/uL — ABNORMAL HIGH (ref 0.00–0.07)
Basophils Absolute: 0.3 10*3/uL — ABNORMAL HIGH (ref 0.0–0.1)
Basophils Relative: 1 %
Eosinophils Absolute: 0.3 10*3/uL (ref 0.0–0.5)
Eosinophils Relative: 1 %
HCT: 42.2 % (ref 36.0–46.0)
Hemoglobin: 14 g/dL (ref 12.0–15.0)
Immature Granulocytes: 1 %
Lymphocytes Relative: 4 %
Lymphs Abs: 1 10*3/uL (ref 0.7–4.0)
MCH: 36.6 pg — ABNORMAL HIGH (ref 26.0–34.0)
MCHC: 33.2 g/dL (ref 30.0–36.0)
MCV: 110.5 fL — ABNORMAL HIGH (ref 80.0–100.0)
Monocytes Absolute: 0.9 10*3/uL (ref 0.1–1.0)
Monocytes Relative: 3 %
Neutro Abs: 23.3 10*3/uL — ABNORMAL HIGH (ref 1.7–7.7)
Neutrophils Relative %: 90 %
Platelets: 688 10*3/uL — ABNORMAL HIGH (ref 150–400)
RBC: 3.82 MIL/uL — ABNORMAL LOW (ref 3.87–5.11)
RDW: 17.6 % — ABNORMAL HIGH (ref 11.5–15.5)
WBC: 26.1 10*3/uL — ABNORMAL HIGH (ref 4.0–10.5)
nRBC: 0 % (ref 0.0–0.2)

## 2020-07-10 LAB — COMPREHENSIVE METABOLIC PANEL
ALT: 14 U/L (ref 0–44)
AST: 21 U/L (ref 15–41)
Albumin: 3.6 g/dL (ref 3.5–5.0)
Alkaline Phosphatase: 66 U/L (ref 38–126)
Anion gap: 7 (ref 5–15)
BUN: 26 mg/dL — ABNORMAL HIGH (ref 8–23)
CO2: 32 mmol/L (ref 22–32)
Calcium: 8.6 mg/dL — ABNORMAL LOW (ref 8.9–10.3)
Chloride: 101 mmol/L (ref 98–111)
Creatinine, Ser: 1.35 mg/dL — ABNORMAL HIGH (ref 0.44–1.00)
GFR, Estimated: 38 mL/min — ABNORMAL LOW (ref >60)
Glucose, Bld: 300 mg/dL — ABNORMAL HIGH (ref 70–99)
Potassium: 4.2 mmol/L (ref 3.5–5.1)
Sodium: 140 mmol/L (ref 135–145)
Total Bilirubin: 0.7 mg/dL (ref 0.3–1.2)
Total Protein: 6 g/dL — ABNORMAL LOW (ref 6.5–8.1)

## 2020-07-10 MED ORDER — OCTREOTIDE ACETATE 30 MG IM KIT
30.0000 mg | PACK | INTRAMUSCULAR | Status: DC
  Administered 2020-07-10: 30 mg via INTRAMUSCULAR
  Filled 2020-07-10: qty 1

## 2020-08-06 ENCOUNTER — Inpatient Hospital Stay: Payer: Medicare PPO | Attending: Oncology

## 2020-08-06 ENCOUNTER — Inpatient Hospital Stay: Payer: Medicare PPO

## 2020-08-06 ENCOUNTER — Inpatient Hospital Stay (HOSPITAL_BASED_OUTPATIENT_CLINIC_OR_DEPARTMENT_OTHER): Payer: Medicare PPO | Admitting: Oncology

## 2020-08-06 ENCOUNTER — Other Ambulatory Visit: Payer: Self-pay | Admitting: *Deleted

## 2020-08-06 VITALS — BP 103/62 | HR 62 | Temp 96.7°F | Resp 16 | Wt 95.6 lb

## 2020-08-06 DIAGNOSIS — Z806 Family history of leukemia: Secondary | ICD-10-CM | POA: Insufficient documentation

## 2020-08-06 DIAGNOSIS — Z833 Family history of diabetes mellitus: Secondary | ICD-10-CM | POA: Insufficient documentation

## 2020-08-06 DIAGNOSIS — D3A029 Benign carcinoid tumor of the large intestine, unspecified portion: Secondary | ICD-10-CM

## 2020-08-06 DIAGNOSIS — Z8249 Family history of ischemic heart disease and other diseases of the circulatory system: Secondary | ICD-10-CM | POA: Diagnosis not present

## 2020-08-06 DIAGNOSIS — I10 Essential (primary) hypertension: Secondary | ICD-10-CM | POA: Insufficient documentation

## 2020-08-06 DIAGNOSIS — Z8673 Personal history of transient ischemic attack (TIA), and cerebral infarction without residual deficits: Secondary | ICD-10-CM | POA: Insufficient documentation

## 2020-08-06 DIAGNOSIS — C787 Secondary malignant neoplasm of liver and intrahepatic bile duct: Secondary | ICD-10-CM

## 2020-08-06 DIAGNOSIS — Z794 Long term (current) use of insulin: Secondary | ICD-10-CM | POA: Diagnosis not present

## 2020-08-06 DIAGNOSIS — E119 Type 2 diabetes mellitus without complications: Secondary | ICD-10-CM | POA: Insufficient documentation

## 2020-08-06 DIAGNOSIS — C7A012 Malignant carcinoid tumor of the ileum: Secondary | ICD-10-CM | POA: Insufficient documentation

## 2020-08-06 DIAGNOSIS — Z853 Personal history of malignant neoplasm of breast: Secondary | ICD-10-CM | POA: Diagnosis not present

## 2020-08-06 DIAGNOSIS — Z9079 Acquired absence of other genital organ(s): Secondary | ICD-10-CM | POA: Insufficient documentation

## 2020-08-06 DIAGNOSIS — Z87891 Personal history of nicotine dependence: Secondary | ICD-10-CM | POA: Diagnosis not present

## 2020-08-06 DIAGNOSIS — D45 Polycythemia vera: Secondary | ICD-10-CM | POA: Insufficient documentation

## 2020-08-06 DIAGNOSIS — Z9049 Acquired absence of other specified parts of digestive tract: Secondary | ICD-10-CM | POA: Diagnosis not present

## 2020-08-06 DIAGNOSIS — Z8542 Personal history of malignant neoplasm of other parts of uterus: Secondary | ICD-10-CM | POA: Insufficient documentation

## 2020-08-06 DIAGNOSIS — C7B02 Secondary carcinoid tumors of liver: Secondary | ICD-10-CM | POA: Insufficient documentation

## 2020-08-06 DIAGNOSIS — Z803 Family history of malignant neoplasm of breast: Secondary | ICD-10-CM | POA: Diagnosis not present

## 2020-08-06 DIAGNOSIS — Z90722 Acquired absence of ovaries, bilateral: Secondary | ICD-10-CM | POA: Insufficient documentation

## 2020-08-06 DIAGNOSIS — Z5181 Encounter for therapeutic drug level monitoring: Secondary | ICD-10-CM

## 2020-08-06 DIAGNOSIS — I252 Old myocardial infarction: Secondary | ICD-10-CM | POA: Diagnosis not present

## 2020-08-06 DIAGNOSIS — Z79899 Other long term (current) drug therapy: Secondary | ICD-10-CM

## 2020-08-06 DIAGNOSIS — D473 Essential (hemorrhagic) thrombocythemia: Secondary | ICD-10-CM

## 2020-08-06 DIAGNOSIS — Z90710 Acquired absence of both cervix and uterus: Secondary | ICD-10-CM | POA: Insufficient documentation

## 2020-08-06 DIAGNOSIS — Z85828 Personal history of other malignant neoplasm of skin: Secondary | ICD-10-CM | POA: Insufficient documentation

## 2020-08-06 LAB — COMPREHENSIVE METABOLIC PANEL
ALT: 13 U/L (ref 0–44)
AST: 18 U/L (ref 15–41)
Albumin: 3.5 g/dL (ref 3.5–5.0)
Alkaline Phosphatase: 53 U/L (ref 38–126)
Anion gap: 13 (ref 5–15)
BUN: 32 mg/dL — ABNORMAL HIGH (ref 8–23)
CO2: 33 mmol/L — ABNORMAL HIGH (ref 22–32)
Calcium: 8.5 mg/dL — ABNORMAL LOW (ref 8.9–10.3)
Chloride: 98 mmol/L (ref 98–111)
Creatinine, Ser: 1.36 mg/dL — ABNORMAL HIGH (ref 0.44–1.00)
GFR, Estimated: 38 mL/min — ABNORMAL LOW (ref >60)
Glucose, Bld: 149 mg/dL — ABNORMAL HIGH (ref 70–99)
Potassium: 3.6 mmol/L (ref 3.5–5.1)
Sodium: 144 mmol/L (ref 135–145)
Total Bilirubin: 1.1 mg/dL (ref 0.3–1.2)
Total Protein: 6.1 g/dL — ABNORMAL LOW (ref 6.5–8.1)

## 2020-08-06 LAB — CBC WITH DIFFERENTIAL/PLATELET
Abs Immature Granulocytes: 0.11 10*3/uL — ABNORMAL HIGH (ref 0.00–0.07)
Basophils Absolute: 0.2 10*3/uL — ABNORMAL HIGH (ref 0.0–0.1)
Basophils Relative: 1 %
Eosinophils Absolute: 0.2 10*3/uL (ref 0.0–0.5)
Eosinophils Relative: 1 %
HCT: 42.2 % (ref 36.0–46.0)
Hemoglobin: 14 g/dL (ref 12.0–15.0)
Immature Granulocytes: 1 %
Lymphocytes Relative: 4 %
Lymphs Abs: 0.7 10*3/uL (ref 0.7–4.0)
MCH: 36.8 pg — ABNORMAL HIGH (ref 26.0–34.0)
MCHC: 33.2 g/dL (ref 30.0–36.0)
MCV: 111.1 fL — ABNORMAL HIGH (ref 80.0–100.0)
Monocytes Absolute: 0.4 10*3/uL (ref 0.1–1.0)
Monocytes Relative: 2 %
Neutro Abs: 15.9 10*3/uL — ABNORMAL HIGH (ref 1.7–7.7)
Neutrophils Relative %: 91 %
Platelets: 523 10*3/uL — ABNORMAL HIGH (ref 150–400)
RBC: 3.8 MIL/uL — ABNORMAL LOW (ref 3.87–5.11)
RDW: 17 % — ABNORMAL HIGH (ref 11.5–15.5)
WBC: 17.5 10*3/uL — ABNORMAL HIGH (ref 4.0–10.5)
nRBC: 0 % (ref 0.0–0.2)

## 2020-08-06 MED ORDER — XERMELO 250 MG PO TABS: 250.0000 mg | ORAL_TABLET | Freq: Three times a day (TID) | ORAL | 0 refills | Status: DC

## 2020-08-06 MED ORDER — OCTREOTIDE ACETATE 30 MG IM KIT
30.0000 mg | PACK | INTRAMUSCULAR | Status: AC
  Administered 2020-08-06: 30 mg via INTRAMUSCULAR

## 2020-08-07 ENCOUNTER — Encounter: Payer: Self-pay | Admitting: Adult Health

## 2020-08-07 ENCOUNTER — Telehealth: Payer: Self-pay | Admitting: Pharmacist

## 2020-08-07 ENCOUNTER — Ambulatory Visit: Payer: Medicare PPO | Admitting: Adult Health

## 2020-08-07 ENCOUNTER — Telehealth: Payer: Self-pay | Admitting: *Deleted

## 2020-08-07 VITALS — BP 90/55 | HR 65 | Ht 60.0 in | Wt 93.0 lb

## 2020-08-07 DIAGNOSIS — I614 Nontraumatic intracerebral hemorrhage in cerebellum: Secondary | ICD-10-CM

## 2020-08-07 DIAGNOSIS — F0150 Vascular dementia without behavioral disturbance: Secondary | ICD-10-CM | POA: Diagnosis not present

## 2020-08-07 DIAGNOSIS — D3A029 Benign carcinoid tumor of the large intestine, unspecified portion: Secondary | ICD-10-CM

## 2020-08-07 MED ORDER — XERMELO 250 MG PO TABS
250.0000 mg | ORAL_TABLET | Freq: Three times a day (TID) | ORAL | 0 refills | Status: DC
Start: 2020-08-07 — End: 2020-08-07

## 2020-08-07 MED ORDER — XERMELO 250 MG PO TABS: 250.0000 mg | ORAL_TABLET | Freq: Three times a day (TID) | ORAL | 0 refills | Status: AC

## 2020-08-07 NOTE — Telephone Encounter (Signed)
Fax received from pharmacy stating that the [place] has to come from a specialty pharmacy

## 2020-08-07 NOTE — Telephone Encounter (Signed)
Oral Oncology Pharmacist Encounter   Prior Authorization for [name] has been approved.     PA# [id number] Effective dates: 08/07/20 through 02/03/21  Copay: $100   Oral Oncology Clinic will continue to follow.   Darl Pikes, PharmD, BCPS. BCOP Hematology/Oncology Clinical Pharmacist [facility]/HP/AP Oral Chemotherapy Navigation Clinic [phone]  08/07/2020 9:43 AM

## 2020-08-07 NOTE — Progress Notes (Signed)
[facility] [address] [place]. [name] [address] [phone]       STROKE FOLLOW UP NOTE  [name] Date of Birth:  Mar 09, 1932 Medical Record Number:  [id number]   Reason for visit: stroke follow up  Chief complaint: Chief Complaint  Patient presents with  . Follow-up    TR with niece (suellen) Pt Is well, things are about the same      HPI:  Today, 08/07/2020, [name] returns for 73-month stroke follow-up accompanied by her niece.  Stable since prior visit without new stroke/TIA symptoms with residual dysarthria and cognitive impairment Per niece, cognition has gradually declined from prior visit MMSE today Denies any behavioral concerns  Continues on Plavix and pravastatin for secondary stroke prevention Blood pressure today 90/55 - monitors at home and typically 140s/70s Per niece, she was recently started on diuretics due to BLE swelling and having issues with diarrhea which is likely contributing to her low blood pressure this afternoon No further concerns at this time   History provided for reference purposes only Update 01/31/2020 JM: [name] returns for stroke follow-up accompanied by her niece. Residual deficits of dysarthria and cognitive impairment.  Dysarthria has been stable.  Niece does believe that her cognition has been slowly declining with occasional confusional episodes, worsening short-term memory and occasional word finding difficulty.  Continues on Seroquel 50 mg nightly and sertraline managed by PCP.  No behavioral related concerns.  No additional hallucinations as initiating Seroquel.  MMSE today 21/30.  Denies new or worsening stroke/TIA symptoms Continues on Plavix and pravastatin without side effects.  Blood pressure today 145/83.  Recent A1c 7.6.  HLD, HTN and DM managed by PCP. 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  Continues on octreotide No further concerns   Update 10/04/2019 JM: [name] is a 87 year old female who is being seen today, 10/04/2019, for stroke follow-up accompanied by her niece.  She has been stable from a stroke standpoint with residual stroke deficits of dysarthria and cognitive impairment.  Continues on sertraline 50 mg daily with stable mood as well as Seroquel 50 mg nightly which was recently increased by PCP due to sleeping difficulties with significant benefit.  Recently being followed by pain management due to bilateral knee pain R >L which limits ambulation and has been started on tramadol 25 mg every other day with benefit.  Shortly after prior visit, she presented to ED on 07/18/2019 after mechanical fall and generalized weakness and found to have leukopenia and anemia likely secondary to hydroxyurea toxicity requiring transfusion.  She continues to follow with oncology regularly for polycythemia and use of hydroxyurea.  She continues to live in her own home but does have 24-hour caregivers.  Continues on Plavix and pravastatin for stroke prevention and blood pressure today 121/72.  No further concerns at this time.  Virtual visit 07/05/2019 JM: [name] is a 87 year old female who is being seen today via virtual visit for stroke follow-up accompanied by her niece.  She has not been seen in over 1 year due to COVID-19 safety precautions.  Residual deficits of dysarthria with hesitancy with wax/wane of symptoms as well as cognitive impairment.  Per daughter, she feels as though her speech has been gradually worsening as far as speaking slower or stuttering as well as worsening memory loss.  She does endorse increased stress and possible depression/anxiety with concerns of  COVID-19 pandemic along with constantly being in pain with ongoing chronic knee pain.  She continues to receive 24-hour care for ongoing assistance.  She has continued on Plavix and pravastatin for  secondary stroke prevention without side effects.  Prior A1c 7.0 on 11/30/2018.  Blood pressure 128-130/60-70. She unfortunately suffered a close subcapital fracture left femur after a fall on 11/30/2018 requiring open reduction internal fixation procedure. April well from hip stand point. Pain in her left knee possibly from arthritis per patient. Needs walker for ambulation. Has been folowing up with ortho with receiving injections without benefit.  Recently referred to pain management clinic for ongoing management.  Denies new or worsening stroke/TIA symptoms.  Interval history 03/23/18 JM: Patient is being seen today for scheduled follow-up visit and is accompanied by her niece.  She continues to have some stuttering concerns and right hand weakness but has been improving.  She participates in PT/OT/ST at [facility] and continues to live independently with 24/7 care.  Aids to assist with ADLs and IADLs.  Patient is questioning whether therapy continued at this time.  She continues to take Plavix only without side effects of bleeding or bruising.  She was planning on undergoing stent procedure by cardiologist at [place] but this ended up being canceled and scheduled appointment with cardiologist through [place] system who believes there is no benefit for stent placement and recommended continuation of medication management and monitoring.  Continues to take pravastatin without side effects myalgias.  Blood pressure today satisfactory 117/67.  Niece does state that current cardiologist has been managing antihypertensives and continues to fluctuate with SBP ranging from 1 10-1 60.  Denies new or worsening stroke/TIA symptoms.   Stroke admission 10/29/2017: [name] an 87 y.o.femalewith a PMH of Stroke, Recurrent Falls, Polycythemia Vera,Microalbuminuria, HTN, TIA, Endometrial Carcinoma, Diabetes Mellitus, Colon Cancer, Breast Cancer, Arthritis, and Adenocarcinoma in Situ of  Cervixpresented to [facility] with black emesis and unresponsive 2/2 hypoxia to [facility] on 10/28/17. CTA Chest revealed pericardial effusion, small pleural effusions, and pulmonary edema. She was treated for sepsis secondary to pneumonia. She is on ASA and Plavix. She was noted to have NSTEMI and underwent cardiac cath which showed 3 vessel disease and severe LV dysfunction. Following cath procedure patient had a head CT which showedright cerebellar hemorrhagewith IVH.She was supposed to receive heparin drip, but was held. Patient transferred to [facility] hospital for further management.Hemorrhage was felt to be secondary to hypertensive etiology on dual antiplatelet therapy.  Neuro wise, she was at risk for cerebral edema and treated with 3%.  There was no neurosurgery intervention that would benefit her.  She continued to progress though still had issues with pulmonary edema and CHF.  Developed leukocytosis, etiology unknown, but resolved during admission.  Once stable, was transferred to the inpatient rehab for ongoing therapy.  Plans are for PCI with stent in the near future. Recommended cardiology to follow-up in 2 weeks. Recommended to resume aspirin at time of discharge from inpatient (7 days) and resume Plavix in 3 weeks.  12/17/17 visit JM: Patient is being seen today for hospital follow-up and is accompanied by her niece.  She continues to improve from a neurological standpoint but does have mild right-sided weakness along with dysarthria.  She continues to participate in home PT/OT/ST.  She does live independently but does have 24/7 care.  Continues to take aspirin 81 mg at this time without bleeding or bruising.  They had not start Plavix as they state  they need clearance from [place] first.  Continues to take pravastatin without myalgias.  Per niece, cardiologist states she needs to be on Plavix for 2 weeks prior to being considered for stent placement.  Blood pressure today satisfactory 124/69.  Patient  denies new or worsening stroke/TIA symptoms.       ROS:   14 system review of systems performed and negative with exception of those listed in HPI  PMH:  Past Medical History:  Diagnosis Date  . Adenocarcinoma in situ of cervix   . Arthritis    hands  . Breast cancer ([place])   . Colon cancer ([place])   . Diabetes mellitus without complication ([place])   . Endometrial carcinoma ([facility])    s/p total abdominal hysterectomy  . H/O compression fracture of spine 2014   thoracic spine  . H/O polycythemia vera   . H/O TIA (transient ischemic attack) and stroke 09/2014, 03/2015   No deficits  . Hypertension   . Hyperuricemia   . Microalbuminuria   . Myocardial infarction ([place])   . Polycythemia vera ([place])   . Recurrent falls   . Skin cancer    face, legs  . Stroke [facility]) 2008   no deficits  . Trochanteric bursitis   . Varicose veins    treated    PSH:  Past Surgical History:  Procedure Laterality Date  . ABDOMINAL HYSTERECTOMY    . BOWEL RESECTION N/A 03/28/2015   Procedure: SMALL BOWEL RESECTION;  Surgeon: Leonie Green, MD;  Location: [facility] ORS;  Service: General;  Laterality: N/A;  . CATARACT EXTRACTION W/ INTRAOCULAR LENS IMPLANT Right   . CATARACT EXTRACTION W/PHACO Left 10/07/2015   Procedure: CATARACT EXTRACTION PHACO AND INTRAOCULAR LENS PLACEMENT (IOC);  Surgeon: Ronnell Freshwater, MD;  Location: [place];  Service: Ophthalmology;  Laterality: Left;  DIABETIC - oral meds VISION BLUE  . CORONARY ANGIOGRAPHY N/A 10/29/2017   Procedure: CORONARY ANGIOGRAPHY;  Surgeon: Dionisio David, MD;  Location: [place] CV LAB;  Service: Cardiovascular;  Laterality: N/A;  . EXPLORATORY LAPAROTOMY     for fibroids  . HIP ARTHROPLASTY Left 12/01/2018   Procedure: ARTHROPLASTY BIPOLAR HIP (HEMIARTHROPLASTY);  Surgeon: Corky Mull, MD;  Location: [facility] ORS;  Service: Orthopedics;  Laterality: Left;  . LEFT HEART CATH Right 10/29/2017   Procedure: Left Heart Cath;   Surgeon: Dionisio David, MD;  Location: [place] CV LAB;  Service: Cardiovascular;  Laterality: Right;  . TONSILLECTOMY      Social History:  Social History   Socioeconomic History  . Marital status: Widowed    Spouse name: Not on file  . Number of children: Not on file  . Years of education: Not on file  . Highest education level: Not on file  Occupational History  . Not on file  Tobacco Use  . Smoking status: Former Research scientist (life sciences)  . Smokeless tobacco: Never Used  Vaping Use  . Vaping Use: Never used  Substance and Sexual Activity  . Alcohol use: No  . Drug use: No  . Sexual activity: Not Currently  Other Topics Concern  . Not on file  Social History Narrative  . Not on file   Social Determinants of Health   Financial Resource Strain: Not on file  Food Insecurity: Not on file  Transportation Needs: Not on file  Physical Activity: Not on file  Stress: Not on file  Social Connections: Not on file  Intimate Partner Violence: Not on file    Family History:  Family History  Problem Relation Age of Onset  . Cancer Brother        AML  . Heart attack Mother   . Breast cancer Mother   . Heart attack Father   . Diabetes Father   . Heart attack Sister   . Diabetes Sister     Medications:   Current Outpatient Medications on File Prior to Visit  Medication Sig Dispense Refill  . acetaminophen (TYLENOL) 325 MG tablet Take 1-2 tablets (325-650 mg total) by mouth every 6 (six) hours as needed for mild pain (pain score 1-3 or temp > 100.5). (Patient taking differently: Take 650 mg by mouth 3 (three) times daily.)    . amLODipine (NORVASC) 5 MG tablet Take 1 tablet (5 mg total) by mouth daily. 30 tablet 1  . calcium-vitamin D (OSCAL WITH D) 500-200 MG-UNIT per tablet Take 1 tablet by mouth daily.     . carvedilol (COREG) 25 MG tablet Take 1 tablet (25 mg total) by mouth 2 (two) times daily with a meal. (Patient taking differently: Take 12.5 mg by mouth 2 (two) times daily  with a meal.) 60 tablet 0  . clopidogrel (PLAVIX) 75 MG tablet TAKE 1 TABLET BY MOUTH DAILY 30 tablet 0  . fexofenadine (ALLEGRA ALLERGY) 180 MG tablet Take 1 tablet (180 mg total) by mouth daily.    . fluticasone (FLONASE) 50 MCG/ACT nasal spray Place 2 sprays into the nose daily.    . furosemide (LASIX) 20 MG tablet Take 20 mg by mouth daily.    . hydroxyurea (HYDREA) 500 MG capsule Take 1 capsule (500 mg total) by mouth daily. 30 capsule 3  . insulin detemir (LEVEMIR) 100 unit/ml SOLN Inject 0.08 mLs (8 Units total) into the skin at bedtime. (Patient taking differently: Inject 12 Units into the skin at bedtime.) 8 mL 1  . octreotide (SANDOSTATIN LAR) 10 MG injection Inject 10 mg into the muscle every 28 (twenty-eight) days.    . pantoprazole (PROTONIX) 40 MG tablet Take 1 tablet (40 mg total) by mouth daily. 30 tablet 0  . pravastatin (PRAVACHOL) 20 MG tablet Take 1 tablet (20 mg total) by mouth at bedtime. 30 tablet 0  . QUEtiapine (SEROQUEL) 50 MG tablet Take 50 mg by mouth at bedtime.     . ramipril (ALTACE) 5 MG capsule Take 5 mg by mouth 2 (two) times daily.    . sertraline (ZOLOFT) 50 MG tablet Take 1 tablet (50 mg total) by mouth daily. 30 tablet 4  . traMADol (ULTRAM) 50 MG tablet 25 mg every other day.     . vitamin B-12 1000 MCG tablet Take 1 tablet (1,000 mcg total) by mouth daily. 30 tablet 1  . spironolactone (ALDACTONE) 25 MG tablet Take 0.5 tablets (12.5 mg total) by mouth every other day for 30 days. (Patient taking differently: Take 12.5 mg by mouth See admin instructions. Take 12.5 mg by mouth twice daily every other day) 7.5 tablet 0   Current Facility-Administered Medications on File Prior to Visit  Medication Dose Route Frequency Provider Last Rate Last Admin  . octreotide (SANDOSTATIN LAR) IM injection 30 mg  30 mg Intramuscular Q30 days Sindy Guadeloupe, MD   30 mg at 10/30/19 1134  . octreotide (SANDOSTATIN LAR) IM injection 30 mg  30 mg Intramuscular Q30 days Sindy Guadeloupe, MD   30 mg at 12/29/19 1044  . octreotide (SANDOSTATIN LAR) IM injection 30 mg  30 mg Intramuscular Q30 days Sindy Guadeloupe,  MD   30 mg at 02/02/20 1512  . octreotide (SANDOSTATIN LAR) IM injection 30 mg  30 mg Intramuscular Q30 days Sindy Guadeloupe, MD   30 mg at 05/06/20 1550  . octreotide (SANDOSTATIN LAR) IM injection 30 mg  30 mg Intramuscular Q30 days Sindy Guadeloupe, MD   30 mg at 08/06/20 1518    Allergies:   Allergies  Allergen Reactions  . Other Other (See Comments)    ALL OPIOIDS   . Simvastatin Other (See Comments)    Myalgia     Vitals: Today's Vitals   08/07/20 1541  BP: (!) 90/55  Pulse: 65  Weight: 93 lb (42.2 kg)  Height: 5' (1.524 m)   Body mass index is 18.16 kg/m.   Physical Exam  General: Frail very pleasant elderly Caucasian female, seated, in no evident distress Head: head normocephalic and atraumatic.   Neck: supple with no carotid or supraclavicular bruits Cardiovascular: regular rate and rhythm, no murmurs Musculoskeletal: no deformity Skin:  no rash/petichiae; skin tears Vascular:  Normal pulses all extremities   Neurologic Exam Mental Status: Awake and fully alert. Mild to moderate dysarthria. Recent and remote memory diminished. Mood and affect appropriate.   MMSE - Mini Mental State Exam 08/07/2020 08/07/2020 01/31/2020  Orientation to time 1 1 2   Orientation to Place 2 - 3  Registration 3 - 3  Attention/ Calculation 0 - 5  Recall 0 - 2  Language- name 2 objects 1 - 0  Language- repeat 1 - 1  Language- follow 3 step command 3 - 3  Language- read & follow direction 1 - 1  Write a sentence 1 - 1  Copy design 0 - 0  Total score 13 - 21   Cranial Nerves: Pupils equal, briskly reactive to light. Extraocular movements full without nystagmus. Visual fields full to confrontation. Hearing intact. Facial sensation intact. Face, tongue, palate moves normally and symmetrically.  Motor: Normal bulk and tone. Normal strength in all tested  extremity muscles. Sensory.: intact to touch , pinprick , position and vibratory sensation.  Coordination: Rapid alternating movements normal in all extremities. Finger-to-nose performed accurately bilaterally and heel-to-shin difficulty performing due to bilateral knee pain Gait and Station: Arises from chair without difficulty. Stance is hunched. Gait demonstrates normal short cautious steps with use of Rollator walker Reflexes: 1+ and symmetric. Toes downgoing.          ASSESSMENT/PLAN: [name] is a 87 y.o. year old female here with right cerebellar parenchymal hemorrhage with IVH on 10/31/17 secondary to hypertensive plus DAPT. 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     R cerebellar IPH -Residual dysarthria and cognitive impairment -Continue Plavix 75 mg daily and pravastatin for secondary stroke prevention -F/u with PCP regarding your HLD, HTN and DM management  Cognitive impairment -MMSE today 13/30 (prior 19/30) -No behavioral concerns -Discussed importance of routine activity (as tolerated), adequate sleep and healthy diet as well as managing stroke risk factors and continuing brain exercises    Follow up in 6 months or call earlier if needed   CC:  [facility] provider: Dr. Andrey Farmer, Chrystie Nose, MD    I spent 30 minutes of face-to-face and non-face-to-face time with patient and niece.  This included previsit chart review, lab review, study review, order entry, electronic health record documentation, patient education regarding prior stroke, managing stroke risk factors, residual deficits including cognitive impairment with  performing and reviewing MMSE, and answering all questions to patient and family satisfaction   Frann Rider, [facility]  [facility] [address] [place], [place] [address]  Phone [phone] Fax  [phone]

## 2020-08-07 NOTE — Telephone Encounter (Signed)
I just spoke to [facility] and she said it is specialty pharmacy and she says she thinks only biologics takes care of this med. She will look into and let me know and what pharmacy and cost.

## 2020-08-07 NOTE — Telephone Encounter (Signed)
Oral Oncology Pharmacist Encounter   Received notification from [facility] that prior authorization for [name] is required.   PA submitted on CMM Key [id number]  Status is pending   Oral Oncology Clinic will continue to follow.   Darl Pikes, PharmD, BCPS, [facility] Hematology/Oncology Clinical Pharmacist [facility] Oral [place] Clinic [phone]  08/07/2020 9:37 AM

## 2020-08-07 NOTE — Telephone Encounter (Signed)
Oral Oncology Pharmacist Encounter  Received new prescription for Xermelo (telotristat ethyl) for the treatment of progressive carcinoid tumor with liver metastases, new carcinoid syndrome related diarrhea, planned duration until disease progression or unacceptable drug toxicity.  Labs from 08/06/2020 assessed, okay for initiating treatment. Prescription dose and frequency assessed.   Current medication list in Epic reviewed, no DDIs with Xermelo identified.  Evaluated chart and no patient barriers to medication adherence identified. Patient has a 24 hour caregiver.  Prescription has been e-scribed to the [facility] for benefits analysis and approval.  Oral Oncology Clinic will continue to follow for insurance authorization, copayment issues, initial counseling and start date.  Eddie Candle, PharmD, BCPS PGY2 Hematology/Oncology Pharmacy Resident Oral Chemotherapy Navigation Clinic 08/07/2020 9:22 AM

## 2020-08-07 NOTE — Progress Notes (Signed)
I agree with the above plan 

## 2020-08-08 DIAGNOSIS — C7A012 Malignant carcinoid tumor of the ileum: Secondary | ICD-10-CM | POA: Diagnosis not present

## 2020-08-08 LAB — CHROMOGRANIN A: Chromogranin A (ng/mL): 306.3 ng/mL — ABNORMAL HIGH (ref 0.0–101.8)

## 2020-08-08 MED FILL — XERMELO 250 MG TABS: 250 | 28 days supply | Qty: 84 | Fill #0

## 2020-08-09 ENCOUNTER — Other Ambulatory Visit: Payer: Self-pay

## 2020-08-09 DIAGNOSIS — C787 Secondary malignant neoplasm of liver and intrahepatic bile duct: Secondary | ICD-10-CM

## 2020-08-09 DIAGNOSIS — D3A029 Benign carcinoid tumor of the large intestine, unspecified portion: Secondary | ICD-10-CM

## 2020-08-13 ENCOUNTER — Telehealth: Payer: Self-pay

## 2020-08-13 NOTE — Telephone Encounter (Signed)
Volunteer called patient on behalf of Palliative Care and did not get a answer from patient/family. ° °

## 2020-08-14 ENCOUNTER — Telehealth: Payer: Self-pay | Admitting: *Deleted

## 2020-08-14 NOTE — Telephone Encounter (Signed)
I called the niece and left her a message that Dr. Janese Banks has asked me to call her and talk about possibly getting Lutathera and also to possibly add her on for her next visit to get octreotide injection. I left my direct telephone number to call me back.

## 2020-08-14 NOTE — Progress Notes (Signed)
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 Mike Gip for polycythemia vera/ETand she was on Hydrea for the same. Her last visit with her as an outpatient was in May 2020. She was taking hydroxyurea 500 mg 5 times a week. 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 Tumor was 2.5 cm with 9+ lymph nodes pathologic stage was T3N1. Her chromogranin levels since then have been monitored regularly and they have been chronically elevated between 200-2 50. Patient was noted to have liver lesions but did not have any significant metabolic uptake on PET CT scan in November 2019. At her last visit with Dr. Mike Gip in May 2020 plan was to get dotatate PET scan followed by consideration for liver biopsy and at that time patient was lost to follow-up  Dotatate scan in February 2021 showed multiple areas ofLiver metastases with intense radiotracer activity consistent with well-differentiated metastatic neuroendocrine tumor. Single small peritoneal implant along ventral peritoneal surface. No other evidence of malignancy.Patient was started on octreotide in March 2021  Patient is also on Hydrea5times a  week for her JAK2 positive ET. She has had significant pancytopenia with higher doses of Hydrea in the past    Interval history-patient is here with her caregiver today.  Caregiver reports repeated episodes of diarrhea which is not well controlled despite Imodium.  She has not had any recent falls.  ECOG PS- 3 Pain scale- 0   Review of systems- Review of Systems  Constitutional: Positive for malaise/fatigue. Negative for chills, fever and weight loss.  HENT: Negative for congestion, ear discharge and nosebleeds.   Eyes: Negative for blurred vision.  Respiratory: Negative for cough, hemoptysis, sputum production, shortness of breath and wheezing.   Cardiovascular: Negative for chest pain, palpitations, orthopnea and claudication.  Gastrointestinal: Positive for diarrhea. Negative for abdominal pain, blood in stool, constipation, heartburn, melena, nausea and vomiting.  Genitourinary: Negative for dysuria, flank pain, frequency, hematuria and urgency.  Musculoskeletal: Negative for back pain, joint pain and myalgias.  Skin: Negative for rash.  Neurological: Negative for dizziness, tingling, focal weakness, seizures, weakness and headaches.  Endo/Heme/Allergies: Does not bruise/bleed easily.  Psychiatric/Behavioral: Negative for depression and suicidal ideas. The patient does not have insomnia.     Allergies  Allergen Reactions  . Other Other (See Comments)    ALL OPIOIDS   . Simvastatin Other (See Comments)    Myalgia     Past Medical History:  Diagnosis Date  . Adenocarcinoma in situ of cervix   . Arthritis    hands  . Breast cancer ([place])   . Colon cancer ([place])   . Diabetes mellitus without complication ([place])   . Endometrial carcinoma ([facility])    s/p total abdominal hysterectomy  . H/O compression fracture of spine 2014   thoracic spine  . H/O polycythemia  vera   . H/O TIA (transient ischemic attack) and stroke 09/2014, 03/2015   No deficits  . Hypertension   .  Hyperuricemia   . Microalbuminuria   . Myocardial infarction ([place])   . Polycythemia vera ([place])   . Recurrent falls   . Skin cancer    face, legs  . Stroke [facility]) 2008   no deficits  . Trochanteric bursitis   . Varicose veins    treated     Past Surgical History:  Procedure Laterality Date  . ABDOMINAL HYSTERECTOMY    . BOWEL RESECTION N/A 03/28/2015   Procedure: SMALL BOWEL RESECTION;  Surgeon: Leonie Green, MD;  Location: [facility];  Service: General;  Laterality: N/A;  . CATARACT EXTRACTION W/ INTRAOCULAR LENS IMPLANT Right   . CATARACT EXTRACTION W/PHACO Left 10/07/2015   Procedure: CATARACT EXTRACTION PHACO AND INTRAOCULAR LENS PLACEMENT (IOC);  Surgeon: Ronnell Freshwater, MD;  Location: [place];  Service: Ophthalmology;  Laterality: Left;  DIABETIC - oral meds VISION BLUE  . CORONARY ANGIOGRAPHY N/A 10/29/2017   Procedure: CORONARY ANGIOGRAPHY;  Surgeon: Dionisio David, MD;  Location: [place] [facility];  Service: Cardiovascular;  Laterality: N/A;  . EXPLORATORY LAPAROTOMY     for fibroids  . HIP ARTHROPLASTY Left 12/01/2018   Procedure: ARTHROPLASTY BIPOLAR HIP (HEMIARTHROPLASTY);  Surgeon: Corky Mull, MD;  Location: [facility];  Service: Orthopedics;  Laterality: Left;  . LEFT HEART CATH Right 10/29/2017   Procedure: Left Heart Cath;  Surgeon: Dionisio David, MD;  Location: [place] [facility];  Service: Cardiovascular;  Laterality: Right;  . TONSILLECTOMY      Social History   Socioeconomic History  . Marital status: Widowed    Spouse name: Not on file  . Number of children: Not on file  . Years of education: Not on file  . Highest education level: Not on file  Occupational History  . Not on file  Tobacco Use  . Smoking status: Former Research scientist (life sciences)  . Smokeless tobacco: Never Used  Vaping Use  . Vaping Use: Never used  Substance and Sexual Activity  . Alcohol use: No  . Drug use: No  . Sexual activity: Not Currently  Other Topics Concern   . Not on file  Social History Narrative  . Not on file   Social Determinants of Health   Financial Resource Strain: Not on file  Food Insecurity: Not on file  Transportation Needs: Not on file  Physical Activity: Not on file  Stress: Not on file  Social Connections: Not on file  Intimate Partner Violence: Not on file    Family History  Problem Relation Age of Onset  . Cancer Brother        AML  . Heart attack Mother   . Breast cancer Mother   . Heart attack Father   . Diabetes Father   . Heart attack Sister   . Diabetes Sister      Current Outpatient Medications:  .  acetaminophen (TYLENOL) 325 MG tablet, Take 1-2 tablets (325-650 mg total) by mouth every 6 (six) hours as needed for mild pain (pain score 1-3 or temp > 100.5). (Patient taking differently: Take 650 mg by mouth 3 (three) times daily.), Disp:  , Rfl:  .  amLODipine (NORVASC) 5 MG tablet, Take 1 tablet (5 mg total) by mouth daily., Disp: 30 tablet, Rfl: 1 .  calcium-vitamin D (OSCAL WITH D) 500-200 MG-UNIT per tablet, Take 1 tablet by mouth daily. , Disp: ,  Rfl:  .  carvedilol (COREG) 25 MG tablet, Take 1 tablet (25 mg total) by mouth 2 (two) times daily with a meal. (Patient taking differently: Take 12.5 mg by mouth 2 (two) times daily with a meal.), Disp: 60 tablet, Rfl: 0 .  clopidogrel (PLAVIX) 75 MG tablet, TAKE 1 TABLET BY MOUTH DAILY, Disp: 30 tablet, Rfl: 0 .  fexofenadine (ALLEGRA ALLERGY) 180 MG tablet, Take 1 tablet (180 mg total) by mouth daily., Disp: , Rfl:  .  fluticasone (FLONASE) 50 MCG/ACT nasal spray, Place 2 sprays into the nose daily., Disp: , Rfl:  .  hydroxyurea (HYDREA) 500 MG capsule, Take 1 capsule (500 mg total) by mouth daily., Disp: 30 capsule, Rfl: 3 .  insulin detemir (LEVEMIR) 100 unit/ml SOLN, Inject 0.08 mLs (8 Units total) into the skin at bedtime. (Patient taking differently: Inject 12 Units into the skin at bedtime.), Disp: 8 mL, Rfl: 1 .  pantoprazole (PROTONIX) 40 MG tablet,  Take 1 tablet (40 mg total) by mouth daily., Disp: 30 tablet, Rfl: 0 .  pravastatin (PRAVACHOL) 20 MG tablet, Take 1 tablet (20 mg total) by mouth at bedtime., Disp: 30 tablet, Rfl: 0 .  QUEtiapine (SEROQUEL) 50 MG tablet, Take 50 mg by mouth at bedtime. , Disp: , Rfl:  .  ramipril (ALTACE) 5 MG capsule, Take 5 mg by mouth 2 (two) times daily., Disp: , Rfl:  .  sertraline (ZOLOFT) 50 MG tablet, Take 1 tablet (50 mg total) by mouth daily., Disp: 30 tablet, Rfl: 4 .  traMADol (ULTRAM) 50 MG tablet, 25 mg every other day. , Disp: , Rfl:  .  vitamin B-12 1000 MCG tablet, Take 1 tablet (1,000 mcg total) by mouth daily., Disp: 30 tablet, Rfl: 1 .  furosemide (LASIX) 20 MG tablet, Take 20 mg by mouth daily., Disp: , Rfl:  .  octreotide (SANDOSTATIN LAR) 10 MG injection, Inject 10 mg into the muscle every 28 (twenty-eight) days., Disp: , Rfl:  .  spironolactone (ALDACTONE) 25 MG tablet, Take 0.5 tablets (12.5 mg total) by mouth every other day for 30 days. (Patient taking differently: Take 12.5 mg by mouth See admin instructions. Take 12.5 mg by mouth twice daily every other day), Disp: 7.5 tablet, Rfl: 0 .  Telotristat Ethyl,as Etiprate, (XERMELO) 250 MG TABS, Take 250 mg by mouth 3 (three) times daily with meals., Disp: 90 tablet, Rfl: 0 No current facility-administered medications for this visit.  Facility-Administered Medications Ordered in Other Visits:  .  octreotide (SANDOSTATIN LAR) IM injection 30 mg, 30 mg, Intramuscular, Q30 days, Sindy Guadeloupe, MD, 30 mg at 10/30/19 1134 .  octreotide (SANDOSTATIN LAR) IM injection 30 mg, 30 mg, Intramuscular, Q30 days, Sindy Guadeloupe, MD, 30 mg at 12/29/19 1044 .  octreotide (SANDOSTATIN LAR) IM injection 30 mg, 30 mg, Intramuscular, Q30 days, Sindy Guadeloupe, MD, 30 mg at 02/02/20 1512 .  octreotide (SANDOSTATIN LAR) IM injection 30 mg, 30 mg, Intramuscular, Q30 days, Sindy Guadeloupe, MD, 30 mg at 05/06/20 1550 .  octreotide (SANDOSTATIN LAR) IM injection 30  mg, 30 mg, Intramuscular, Q30 days, Sindy Guadeloupe, MD, 30 mg at 08/06/20 1518  Physical exam:  Vitals:   08/06/20 1449  BP: 103/62  Pulse: 62  Resp: 16  Temp: (!) 96.7 F (35.9 C)  TempSrc: Tympanic  SpO2: 100%  Weight: 95 lb 9.6 oz (43.4 kg)   Physical Exam Constitutional:      Comments: Frail cachectic elderly female sitting in a wheelchair.  Appears fatigued  Eyes:     Extraocular Movements: EOM normal.     Pupils: Pupils are equal, round, and reactive to light.  Cardiovascular:     Rate and Rhythm: Normal rate and regular rhythm.     Heart sounds: Normal heart sounds.  Pulmonary:     Effort: Pulmonary effort is normal.     Breath sounds: Normal breath sounds.  Abdominal:     General: Bowel sounds are normal.     Palpations: Abdomen is soft.  Skin:    General: Skin is warm and dry.  Neurological:     Mental Status: She is alert and oriented to person, place, and time.      CMP Latest Ref Rng & Units 08/06/2020  Glucose 70 - 99 mg/dL 149(H)  BUN 8 - 23 mg/dL 32(H)  Creatinine 0.44 - 1.00 mg/dL 1.36(H)  Sodium 135 - 145 mmol/L 144  Potassium 3.5 - 5.1 mmol/L 3.6  Chloride 98 - 111 mmol/L 98  CO2 22 - 32 mmol/L 33(H)  Calcium 8.9 - 10.3 mg/dL 8.5(L)  Total Protein 6.5 - 8.1 g/dL 6.1(L)  Total Bilirubin 0.3 - 1.2 mg/dL 1.1  Alkaline Phos 38 - 126 U/L 53  AST 15 - 41 U/L 18  ALT 0 - 44 U/L 13   CBC Latest Ref Rng & Units 08/06/2020  WBC 4.0 - 10.5 K/uL 17.5(H)  Hemoglobin 12.0 - 15.0 g/dL 14.0  Hematocrit 36.0 - 46.0 % 42.2  Platelets 150 - 400 K/uL 523(H)      Assessment and plan- Patient is a 87 y.o. female who is here for follow-up of following issues:  1.  Essential thrombocytosis: Presently on Hydrea. We have been careful with her Hydrea dosing since she has had pancytopenia in the past.  Her most recent CBC shows white count of 17.5 hemoglobin of 14 and platelets of 523.  I plan to continue her on the same dose of Hydrea.  2.  Metastatic carcinoid  with liver metastases: Patient's chromogranin levels have been gradually going up From 199 9 months ago to 306 presently.  CT abdomen and pelvis without contrast shows increase in the number and the size of liver lesions as well.  Patient is having multiple episodes of diarrhea which may also be related to carcinoid.  Antidiarrheals have not helped.  I will plan to check 24-hour 5 HIAA urine today.  I will start her on Xermelo to 50 mg 3 times daily with meals to see if that would help her with diarrhea.  Overall patient has had progression on her recent octreotide.  Given her age and frailty I do not think that she would be a candidate for any oral agents such as everolimus or r.  She had a dotatate PET scan about a year ago which did show that her liver metastases were Dotatate avid.  She could be a potential candidate for Lutathera.  However this will need to be evaluated by interventional radiology at [facility] and I have explained all this to the patient's niece who is her healthcare power of attorney as well.  It would require the patient to travel to [facility] and get the Lutathera through nuclear medicine which can go for about 3 to 4 hours every 2 months for 4 patient's niece would like to think about these options and get back to [place] via phone call or email.    I will tentatively see her back in 1 month with CBC with differential CMP and chromogranin  A   Visit Diagnosis 1. Carcinoid tumor of colon   2. Liver metastases ([place])      Dr. Randa Evens, MD, MPH [facility] at [facility] [phone] 08/14/2020 10:18 AM

## 2020-08-20 LAB — 5 HIAA, QUANTITATIVE, URINE, 24 HOUR
5-HIAA, Ur: 11.4 mg/L
5-HIAA,Quant.,24 Hr Urine: 7.4 mg/24 hr (ref 0.0–14.9)
Total Volume: 650

## 2020-08-22 ENCOUNTER — Encounter: Payer: Self-pay | Admitting: Oncology

## 2020-09-05 ENCOUNTER — Other Ambulatory Visit: Payer: Self-pay

## 2020-09-05 ENCOUNTER — Inpatient Hospital Stay: Payer: Medicare PPO

## 2020-09-05 ENCOUNTER — Inpatient Hospital Stay (HOSPITAL_BASED_OUTPATIENT_CLINIC_OR_DEPARTMENT_OTHER): Payer: Medicare PPO | Admitting: Oncology

## 2020-09-05 ENCOUNTER — Inpatient Hospital Stay: Payer: Medicare PPO | Attending: Oncology

## 2020-09-05 VITALS — BP 103/63 | HR 61 | Temp 96.3°F | Resp 18 | Wt 93.2 lb

## 2020-09-05 DIAGNOSIS — Z8249 Family history of ischemic heart disease and other diseases of the circulatory system: Secondary | ICD-10-CM | POA: Diagnosis not present

## 2020-09-05 DIAGNOSIS — Z8673 Personal history of transient ischemic attack (TIA), and cerebral infarction without residual deficits: Secondary | ICD-10-CM | POA: Diagnosis not present

## 2020-09-05 DIAGNOSIS — Z5181 Encounter for therapeutic drug level monitoring: Secondary | ICD-10-CM | POA: Diagnosis not present

## 2020-09-05 DIAGNOSIS — Z79899 Other long term (current) drug therapy: Secondary | ICD-10-CM

## 2020-09-05 DIAGNOSIS — Z8542 Personal history of malignant neoplasm of other parts of uterus: Secondary | ICD-10-CM | POA: Insufficient documentation

## 2020-09-05 DIAGNOSIS — Z85828 Personal history of other malignant neoplasm of skin: Secondary | ICD-10-CM | POA: Diagnosis not present

## 2020-09-05 DIAGNOSIS — Z85038 Personal history of other malignant neoplasm of large intestine: Secondary | ICD-10-CM | POA: Diagnosis not present

## 2020-09-05 DIAGNOSIS — Z806 Family history of leukemia: Secondary | ICD-10-CM | POA: Diagnosis not present

## 2020-09-05 DIAGNOSIS — D473 Essential (hemorrhagic) thrombocythemia: Secondary | ICD-10-CM | POA: Diagnosis not present

## 2020-09-05 DIAGNOSIS — Z794 Long term (current) use of insulin: Secondary | ICD-10-CM | POA: Diagnosis not present

## 2020-09-05 DIAGNOSIS — D45 Polycythemia vera: Secondary | ICD-10-CM

## 2020-09-05 DIAGNOSIS — Z803 Family history of malignant neoplasm of breast: Secondary | ICD-10-CM | POA: Diagnosis not present

## 2020-09-05 DIAGNOSIS — D3A029 Benign carcinoid tumor of the large intestine, unspecified portion: Secondary | ICD-10-CM

## 2020-09-05 DIAGNOSIS — I10 Essential (primary) hypertension: Secondary | ICD-10-CM | POA: Insufficient documentation

## 2020-09-05 DIAGNOSIS — Z833 Family history of diabetes mellitus: Secondary | ICD-10-CM | POA: Diagnosis not present

## 2020-09-05 DIAGNOSIS — C7A012 Malignant carcinoid tumor of the ileum: Secondary | ICD-10-CM | POA: Insufficient documentation

## 2020-09-05 DIAGNOSIS — Z90710 Acquired absence of both cervix and uterus: Secondary | ICD-10-CM | POA: Insufficient documentation

## 2020-09-05 DIAGNOSIS — E119 Type 2 diabetes mellitus without complications: Secondary | ICD-10-CM | POA: Insufficient documentation

## 2020-09-05 DIAGNOSIS — I252 Old myocardial infarction: Secondary | ICD-10-CM | POA: Insufficient documentation

## 2020-09-05 DIAGNOSIS — Z853 Personal history of malignant neoplasm of breast: Secondary | ICD-10-CM | POA: Insufficient documentation

## 2020-09-05 DIAGNOSIS — C7B02 Secondary carcinoid tumors of liver: Secondary | ICD-10-CM | POA: Diagnosis present

## 2020-09-05 DIAGNOSIS — Z87891 Personal history of nicotine dependence: Secondary | ICD-10-CM | POA: Insufficient documentation

## 2020-09-05 LAB — CBC WITH DIFFERENTIAL/PLATELET
Abs Immature Granulocytes: 0.1 10*3/uL — ABNORMAL HIGH (ref 0.00–0.07)
Basophils Absolute: 0.2 10*3/uL — ABNORMAL HIGH (ref 0.0–0.1)
Basophils Relative: 1 %
Eosinophils Absolute: 0.2 10*3/uL (ref 0.0–0.5)
Eosinophils Relative: 1 %
HCT: 37.5 % (ref 36.0–46.0)
Hemoglobin: 12.4 g/dL (ref 12.0–15.0)
Immature Granulocytes: 1 %
Lymphocytes Relative: 6 %
Lymphs Abs: 1 10*3/uL (ref 0.7–4.0)
MCH: 37.9 pg — ABNORMAL HIGH (ref 26.0–34.0)
MCHC: 33.1 g/dL (ref 30.0–36.0)
MCV: 114.7 fL — ABNORMAL HIGH (ref 80.0–100.0)
Monocytes Absolute: 0.4 10*3/uL (ref 0.1–1.0)
Monocytes Relative: 3 %
Neutro Abs: 14.1 10*3/uL — ABNORMAL HIGH (ref 1.7–7.7)
Neutrophils Relative %: 88 %
Platelets: 562 10*3/uL — ABNORMAL HIGH (ref 150–400)
RBC: 3.27 MIL/uL — ABNORMAL LOW (ref 3.87–5.11)
RDW: 18.5 % — ABNORMAL HIGH (ref 11.5–15.5)
Smear Review: NORMAL
WBC: 16 10*3/uL — ABNORMAL HIGH (ref 4.0–10.5)
nRBC: 0 % (ref 0.0–0.2)

## 2020-09-05 LAB — COMPREHENSIVE METABOLIC PANEL
ALT: 34 U/L (ref 0–44)
AST: 31 U/L (ref 15–41)
Albumin: 3.4 g/dL — ABNORMAL LOW (ref 3.5–5.0)
Alkaline Phosphatase: 55 U/L (ref 38–126)
Anion gap: 13 (ref 5–15)
BUN: 32 mg/dL — ABNORMAL HIGH (ref 8–23)
CO2: 27 mmol/L (ref 22–32)
Calcium: 8.5 mg/dL — ABNORMAL LOW (ref 8.9–10.3)
Chloride: 101 mmol/L (ref 98–111)
Creatinine, Ser: 1.38 mg/dL — ABNORMAL HIGH (ref 0.44–1.00)
GFR, Estimated: 37 mL/min — ABNORMAL LOW (ref >60)
Glucose, Bld: 220 mg/dL — ABNORMAL HIGH (ref 70–99)
Potassium: 3.7 mmol/L (ref 3.5–5.1)
Sodium: 141 mmol/L (ref 135–145)
Total Bilirubin: 0.7 mg/dL (ref 0.3–1.2)
Total Protein: 5.7 g/dL — ABNORMAL LOW (ref 6.5–8.1)

## 2020-09-05 MED ORDER — OCTREOTIDE ACETATE 30 MG IM KIT
30.0000 mg | PACK | INTRAMUSCULAR | Status: AC
  Administered 2020-09-05: 30 mg via INTRAMUSCULAR
  Filled 2020-09-05: qty 1

## 2020-09-07 LAB — CHROMOGRANIN A: Chromogranin A (ng/mL): 279.1 ng/mL — ABNORMAL HIGH (ref 0.0–101.8)

## 2020-09-08 NOTE — Progress Notes (Signed)
Hematology/Oncology Consult note 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 Mike Gip for polycythemia vera/ETand she was on Hydrea for the same. Her last visit with her as an outpatient was in May 2020. She was taking hydroxyurea 500 mg 5 times a week. 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 Tumor was 2.5 cm with 9+ lymph nodes pathologic stage was T3N1. Her chromogranin levels since then have been monitored regularly and they have been chronically elevated between 200-2 50. Patient was noted to have liver lesions but did not have any significant metabolic uptake on PET CT scan in November 2019. At her last visit with Dr. Mike Gip in May 2020 plan was to get dotatate PET scan followed by consideration for liver biopsy and at that time patient was lost to follow-up  Dotatate scan in February 2021 showed multiple areas ofLiver metastases with intense radiotracer activity consistent with well-differentiated metastatic neuroendocrine tumor. Single small peritoneal implant along ventral peritoneal surface. No other evidence of malignancy.Patient was started on octreotide in March 2021  Patient is also on Hydrea5times a  week for her JAK2 positive ET. She has had significant pancytopenia with higher doses of Hydrea in the past  Interval history-patient is here with her caregiver today.  She has baseline frailty and has been having on and off falls.  Diarrhea has been better controlled with Xermelo.  ECOG PS- 3 Pain scale- 0   Review of systems- Review of Systems  Constitutional: Positive for malaise/fatigue. Negative for chills, fever and weight loss.  HENT: Negative for congestion, ear discharge and nosebleeds.   Eyes: Negative for blurred vision.  Respiratory: Negative for cough, hemoptysis, sputum production, shortness of breath and wheezing.   Cardiovascular: Negative for chest pain, palpitations, orthopnea and claudication.  Gastrointestinal: Negative for abdominal pain, blood in stool, constipation, diarrhea, heartburn, melena, nausea and vomiting.  Genitourinary: Negative for dysuria, flank pain, frequency, hematuria and urgency.  Musculoskeletal: Negative for back pain, joint pain and myalgias.  Skin: Negative for rash.  Neurological: Negative for dizziness, tingling, focal weakness, seizures, weakness and headaches.  Endo/Heme/Allergies: Does not bruise/bleed easily.  Psychiatric/Behavioral: Negative for depression and suicidal ideas. The patient does not have insomnia.        Allergies  Allergen Reactions  . Other Other (See Comments)    ALL OPIOIDS   . Simvastatin Other (See Comments)    Myalgia     Past Medical History:  Diagnosis Date  . Adenocarcinoma in situ of cervix   . Arthritis    hands  . Breast cancer ([place])   . Colon cancer ([place])   . Diabetes mellitus without complication ([place])   . Endometrial carcinoma ([facility])    s/p total abdominal hysterectomy  . H/O compression fracture of spine 2014   thoracic spine  . H/O polycythemia vera   .  H/O TIA (transient ischemic attack) and stroke 09/2014, 03/2015   No deficits  . Hypertension   . Hyperuricemia   . Microalbuminuria    . Myocardial infarction ([place])   . Polycythemia vera ([place])   . Recurrent falls   . Skin cancer    face, legs  . Stroke [facility]) 2008   no deficits  . Trochanteric bursitis   . Varicose veins    treated     Past Surgical History:  Procedure Laterality Date  . ABDOMINAL HYSTERECTOMY    . BOWEL RESECTION N/A 03/28/2015   Procedure: SMALL BOWEL RESECTION;  Surgeon: Leonie Green, MD;  Location: [facility];  Service: General;  Laterality: N/A;  . CATARACT EXTRACTION W/ INTRAOCULAR LENS IMPLANT Right   . CATARACT EXTRACTION W/PHACO Left 10/07/2015   Procedure: CATARACT EXTRACTION PHACO AND INTRAOCULAR LENS PLACEMENT (IOC);  Surgeon: Ronnell Freshwater, MD;  Location: [place];  Service: Ophthalmology;  Laterality: Left;  DIABETIC - oral meds VISION BLUE  . CORONARY ANGIOGRAPHY N/A 10/29/2017   Procedure: CORONARY ANGIOGRAPHY;  Surgeon: Dionisio David, MD;  Location: [place] [facility];  Service: Cardiovascular;  Laterality: N/A;  . EXPLORATORY LAPAROTOMY     for fibroids  . HIP ARTHROPLASTY Left 12/01/2018   Procedure: ARTHROPLASTY BIPOLAR HIP (HEMIARTHROPLASTY);  Surgeon: Corky Mull, MD;  Location: [facility];  Service: Orthopedics;  Laterality: Left;  . LEFT HEART CATH Right 10/29/2017   Procedure: Left Heart Cath;  Surgeon: Dionisio David, MD;  Location: [place] [facility];  Service: Cardiovascular;  Laterality: Right;  . TONSILLECTOMY      Social History   Socioeconomic History  . Marital status: Widowed    Spouse name: Not on file  . Number of children: Not on file  . Years of education: Not on file  . Highest education level: Not on file  Occupational History  . Not on file  Tobacco Use  . Smoking status: Former Research scientist (life sciences)  . Smokeless tobacco: Never Used  Vaping Use  . Vaping Use: Never used  Substance and Sexual Activity  . Alcohol use: No  . Drug use: No  . Sexual activity: Not Currently  Other Topics Concern  . Not on file  Social History  Narrative  . Not on file   Social Determinants of Health   Financial Resource Strain: Not on file  Food Insecurity: Not on file  Transportation Needs: Not on file  Physical Activity: Not on file  Stress: Not on file  Social Connections: Not on file  Intimate Partner Violence: Not on file    Family History  Problem Relation Age of Onset  . Cancer Brother        AML  . Heart attack Mother   . Breast cancer Mother   . Heart attack Father   . Diabetes Father   . Heart attack Sister   . Diabetes Sister      Current Outpatient Medications:  .  acetaminophen (TYLENOL) 325 MG tablet, Take 1-2 tablets (325-650 mg total) by mouth every 6 (six) hours as needed for mild pain (pain score 1-3 or temp > 100.5). (Patient taking differently: Take 650 mg by mouth 3 (three) times daily.), Disp:  , Rfl:  .  amLODipine (NORVASC) 5 MG tablet, Take 1 tablet (5 mg total) by mouth daily., Disp: 30 tablet, Rfl: 1 .  calcium-vitamin D (OSCAL WITH D) 500-200 MG-UNIT per tablet, Take 1 tablet by mouth daily. , Disp: , Rfl:  .  carvedilol (COREG) 25 MG tablet, Take 1 tablet (25 mg total) by mouth 2 (two) times daily with a meal. (Patient taking differently: Take 12.5 mg by mouth 2 (two) times daily with a meal.), Disp: 60 tablet, Rfl: 0 .  clopidogrel (PLAVIX) 75 MG tablet, TAKE 1 TABLET BY MOUTH DAILY, Disp: 30 tablet, Rfl: 0 .  fexofenadine (ALLEGRA ALLERGY) 180 MG tablet, Take 1 tablet (180 mg total) by mouth daily., Disp: , Rfl:  .  fluticasone (FLONASE) 50 MCG/ACT nasal spray, Place 2 sprays into the nose daily., Disp: , Rfl:  .  furosemide (LASIX) 20 MG tablet, Take 20 mg by mouth daily., Disp: , Rfl:  .  hydroxyurea (HYDREA) 500 MG capsule, Take 1 capsule (500 mg total) by mouth daily., Disp: 30 capsule, Rfl: 3 .  insulin detemir (LEVEMIR) 100 unit/ml SOLN, Inject 0.08 mLs (8 Units total) into the skin at bedtime. (Patient taking differently: Inject 12 Units into the skin at bedtime.), Disp: 8 mL, Rfl:  1 .  octreotide (SANDOSTATIN LAR) 10 MG injection, Inject 10 mg into the muscle every 28 (twenty-eight) days., Disp: , Rfl:  .  pantoprazole (PROTONIX) 40 MG tablet, Take 1 tablet (40 mg total) by mouth daily., Disp: 30 tablet, Rfl: 0 .  pravastatin (PRAVACHOL) 20 MG tablet, Take 1 tablet (20 mg total) by mouth at bedtime., Disp: 30 tablet, Rfl: 0 .  QUEtiapine (SEROQUEL) 50 MG tablet, Take 50 mg by mouth at bedtime. , Disp: , Rfl:  .  ramipril (ALTACE) 5 MG capsule, Take 5 mg by mouth 2 (two) times daily., Disp: , Rfl:  .  sertraline (ZOLOFT) 50 MG tablet, Take 1 tablet (50 mg total) by mouth daily., Disp: 30 tablet, Rfl: 4 .  traMADol (ULTRAM) 50 MG tablet, 25 mg every other day. , Disp: , Rfl:  .  vitamin B-12 1000 MCG tablet, Take 1 tablet (1,000 mcg total) by mouth daily., Disp: 30 tablet, Rfl: 1 .  spironolactone (ALDACTONE) 25 MG tablet, Take 0.5 tablets (12.5 mg total) by mouth every other day for 30 days. (Patient taking differently: Take 12.5 mg by mouth See admin instructions. Take 12.5 mg by mouth twice daily every other day), Disp: 7.5 tablet, Rfl: 0 No current facility-administered medications for this visit.  Facility-Administered Medications Ordered in Other Visits:  .  octreotide (SANDOSTATIN LAR) IM injection 30 mg, 30 mg, Intramuscular, Q30 days, Sindy Guadeloupe, MD, 30 mg at 10/30/19 1134 .  octreotide (SANDOSTATIN LAR) IM injection 30 mg, 30 mg, Intramuscular, Q30 days, Sindy Guadeloupe, MD, 30 mg at 12/29/19 1044 .  octreotide (SANDOSTATIN LAR) IM injection 30 mg, 30 mg, Intramuscular, Q30 days, Sindy Guadeloupe, MD, 30 mg at 02/02/20 1512 .  octreotide (SANDOSTATIN LAR) IM injection 30 mg, 30 mg, Intramuscular, Q30 days, Sindy Guadeloupe, MD, 30 mg at 05/06/20 1550 .  octreotide (SANDOSTATIN LAR) IM injection 30 mg, 30 mg, Intramuscular, Q30 days, Sindy Guadeloupe, MD, 30 mg at 08/06/20 1518 .  octreotide (SANDOSTATIN LAR) IM injection 30 mg, 30 mg, Intramuscular, Q30 days, Sindy Guadeloupe, MD, 30 mg at 09/05/20 1543  Physical exam:  Vitals:   09/05/20 1450  BP: 103/63  Pulse: 61  Resp: 18  Temp: (!) 96.3 F (35.7 C)  TempSrc: Tympanic  SpO2: 96%  Weight: 93 lb 3.2 oz (42.3 kg)   Physical Exam Constitutional:      Comments: Thin elderly frail woman sitting in a wheelchair.  Appears in no acute distress  Cardiovascular:     Rate and Rhythm: Normal rate and regular rhythm.     Heart sounds: Normal heart sounds.  Pulmonary:     Effort: Pulmonary effort is normal.     Breath sounds: Normal breath sounds.  Abdominal:     General: Bowel sounds are normal.     Palpations: Abdomen is soft.  Skin:    General: Skin is warm and dry.  Neurological:     Mental Status: She is alert.      CMP Latest Ref Rng & Units 09/05/2020  Glucose 70 - 99 mg/dL 220(H)  BUN 8 - 23 mg/dL 32(H)  Creatinine 0.44 - 1.00 mg/dL 1.38(H)  Sodium 135 - 145 mmol/L 141  Potassium 3.5 - 5.1 mmol/L 3.7  Chloride 98 - 111 mmol/L 101  CO2 22 - 32 mmol/L 27  Calcium 8.9 - 10.3 mg/dL 8.5(L)  Total Protein 6.5 - 8.1 g/dL 5.7(L)  Total Bilirubin 0.3 - 1.2 mg/dL 0.7  Alkaline Phos 38 - 126 U/L 55  AST 15 - 41 U/L 31  ALT 0 - 44 U/L 34   CBC Latest Ref Rng & Units 09/05/2020  WBC 4.0 - 10.5 K/uL 16.0(H)  Hemoglobin 12.0 - 15.0 g/dL 12.4  Hematocrit 36.0 - 46.0 % 37.5  Platelets 150 - 400 K/uL 562(H)    Assessment and plan- Patient is a 87 y.o. female who is here for follow-up of following issues:  1.  Essential thrombocytosis: Continue with present dose of Hydrea.  I am avoiding increasing the dose of Hydrea as it has led to cytopenias in the past.  Presently platelet count stable in the 500s  2.  Metastatic carcinoid with liver metastases: CT done in February 2022 showed progression of her liver metastases.  I have discussed options of Lutathera with patient's niece who is her healthcare proxy but we have not heard back from her if she wants to pursue this option.  We had started the  patient on Xermelo for carcinoid induced diarrhea which seems to have worked well for her and she will continue the same.  For now I will continue giving her octreotide injections once a month unless I hear otherwise from her niece  CBC with differential CMP and octreotide in 1 month 2 months in 3 months and I will see her back in 3 months with chromogranin A levels   Visit Diagnosis 1. Carcinoid tumor of colon   2. Encounter for monitoring octreotide therapy      Dr. Randa Evens, MD, MPH [facility] [phone] 09/08/2020 4:56 PM

## 2020-09-09 ENCOUNTER — Other Ambulatory Visit: Payer: Self-pay

## 2020-09-09 MED ORDER — XERMELO 250 MG PO TABS: 250.0000 mg | ORAL_TABLET | Freq: Three times a day (TID) | ORAL | 0 refills | Status: DC

## 2020-09-11 ENCOUNTER — Other Ambulatory Visit: Payer: Self-pay | Admitting: Oncology

## 2020-09-12 MED FILL — XERMELO 250 MG TABS: 250 | 28 days supply | Qty: 84 | Fill #0

## 2020-09-20 ENCOUNTER — Other Ambulatory Visit (HOSPITAL_COMMUNITY): Payer: Self-pay

## 2020-09-26 ENCOUNTER — Other Ambulatory Visit (HOSPITAL_COMMUNITY): Payer: Self-pay

## 2020-10-02 ENCOUNTER — Other Ambulatory Visit (HOSPITAL_COMMUNITY): Payer: Self-pay

## 2020-10-02 MED FILL — Telotristat Ethyl Tab 250 MG (as Telotristat Etiprate): ORAL | 28 days supply | Qty: 84 | Fill #0 | Status: AC

## 2020-10-03 ENCOUNTER — Inpatient Hospital Stay: Payer: Medicare PPO | Attending: Oncology

## 2020-10-08 ENCOUNTER — Other Ambulatory Visit (HOSPITAL_COMMUNITY): Payer: Self-pay

## 2020-10-18 ENCOUNTER — Other Ambulatory Visit: Payer: Self-pay | Admitting: Oncology

## 2020-10-31 ENCOUNTER — Other Ambulatory Visit (HOSPITAL_COMMUNITY): Payer: Self-pay

## 2020-11-05 ENCOUNTER — Telehealth: Payer: Self-pay

## 2020-11-05 NOTE — Telephone Encounter (Signed)
Volunteer called patient on behalf of [facility] and did not get a answer from patient/family.   

## 2020-11-07 ENCOUNTER — Inpatient Hospital Stay: Payer: Medicare PPO | Attending: Oncology

## 2020-11-13 ENCOUNTER — Encounter: Payer: Self-pay | Admitting: Oncology

## 2020-11-14 ENCOUNTER — Other Ambulatory Visit (HOSPITAL_COMMUNITY): Payer: Self-pay

## 2020-11-19 ENCOUNTER — Other Ambulatory Visit (HOSPITAL_COMMUNITY): Payer: Self-pay

## 2020-11-20 ENCOUNTER — Other Ambulatory Visit (HOSPITAL_COMMUNITY): Payer: Self-pay

## 2020-11-21 ENCOUNTER — Telehealth: Payer: Self-pay | Admitting: Oncology

## 2020-11-21 NOTE — Telephone Encounter (Signed)
Left VM with Collie Siad (patient's POA) with direct # to return call. She wanted to reschedule patient's missed injection appt.

## 2020-11-21 NOTE — Telephone Encounter (Signed)
Just called and left [name] a message (with my [place] ext) Would it be ok to get her in for her injection asap and then keep her appt for labs/MD on 6/17 if she is agreeable?

## 2020-11-22 ENCOUNTER — Other Ambulatory Visit (HOSPITAL_COMMUNITY): Payer: Self-pay

## 2020-11-25 ENCOUNTER — Telehealth: Payer: Self-pay | Admitting: Oncology

## 2020-11-25 NOTE — Telephone Encounter (Signed)
Left VM with patient's POA [name]) in regards to requested appointments (she sent mychart message to r/s missed injection).

## 2020-11-27 ENCOUNTER — Telehealth: Payer: Self-pay | Admitting: Oncology

## 2020-11-27 NOTE — Telephone Encounter (Signed)
Attempt made to contact patient's POA again in regards to missed appointments and possible rescheduling.

## 2020-12-06 ENCOUNTER — Inpatient Hospital Stay (HOSPITAL_BASED_OUTPATIENT_CLINIC_OR_DEPARTMENT_OTHER): Payer: Medicare PPO | Admitting: Oncology

## 2020-12-06 ENCOUNTER — Inpatient Hospital Stay: Payer: Medicare PPO

## 2020-12-06 ENCOUNTER — Other Ambulatory Visit: Payer: Self-pay

## 2020-12-06 ENCOUNTER — Other Ambulatory Visit (HOSPITAL_COMMUNITY): Payer: Self-pay

## 2020-12-06 ENCOUNTER — Inpatient Hospital Stay: Payer: Medicare PPO | Attending: Oncology

## 2020-12-06 ENCOUNTER — Other Ambulatory Visit: Payer: Self-pay | Admitting: Oncology

## 2020-12-06 ENCOUNTER — Encounter: Payer: Self-pay | Admitting: Oncology

## 2020-12-06 VITALS — BP 107/66 | HR 66 | Temp 96.3°F | Resp 17 | Ht 60.0 in | Wt 96.3 lb

## 2020-12-06 DIAGNOSIS — C7A012 Malignant carcinoid tumor of the ileum: Secondary | ICD-10-CM | POA: Insufficient documentation

## 2020-12-06 DIAGNOSIS — C7B02 Secondary carcinoid tumors of liver: Secondary | ICD-10-CM | POA: Diagnosis present

## 2020-12-06 DIAGNOSIS — D45 Polycythemia vera: Secondary | ICD-10-CM

## 2020-12-06 DIAGNOSIS — Z5181 Encounter for therapeutic drug level monitoring: Secondary | ICD-10-CM | POA: Diagnosis not present

## 2020-12-06 DIAGNOSIS — Z79899 Other long term (current) drug therapy: Secondary | ICD-10-CM

## 2020-12-06 DIAGNOSIS — D3A029 Benign carcinoid tumor of the large intestine, unspecified portion: Secondary | ICD-10-CM

## 2020-12-06 DIAGNOSIS — D473 Essential (hemorrhagic) thrombocythemia: Secondary | ICD-10-CM

## 2020-12-06 LAB — COMPREHENSIVE METABOLIC PANEL
ALT: 44 U/L (ref 0–44)
AST: 29 U/L (ref 15–41)
Albumin: 3.2 g/dL — ABNORMAL LOW (ref 3.5–5.0)
Alkaline Phosphatase: 92 U/L (ref 38–126)
Anion gap: 10 (ref 5–15)
BUN: 29 mg/dL — ABNORMAL HIGH (ref 8–23)
CO2: 26 mmol/L (ref 22–32)
Calcium: 8.4 mg/dL — ABNORMAL LOW (ref 8.9–10.3)
Chloride: 102 mmol/L (ref 98–111)
Creatinine, Ser: 1.08 mg/dL — ABNORMAL HIGH (ref 0.44–1.00)
GFR, Estimated: 50 mL/min — ABNORMAL LOW (ref >60)
Glucose, Bld: 202 mg/dL — ABNORMAL HIGH (ref 70–99)
Potassium: 4.2 mmol/L (ref 3.5–5.1)
Sodium: 138 mmol/L (ref 135–145)
Total Bilirubin: 0.6 mg/dL (ref 0.3–1.2)
Total Protein: 5.6 g/dL — ABNORMAL LOW (ref 6.5–8.1)

## 2020-12-06 LAB — CBC WITH DIFFERENTIAL/PLATELET
Abs Immature Granulocytes: 0.28 10*3/uL — ABNORMAL HIGH (ref 0.00–0.07)
Basophils Absolute: 0.2 10*3/uL — ABNORMAL HIGH (ref 0.0–0.1)
Basophils Relative: 1 %
Eosinophils Absolute: 0.2 10*3/uL (ref 0.0–0.5)
Eosinophils Relative: 1 %
HCT: 36.9 % (ref 36.0–46.0)
Hemoglobin: 12.2 g/dL (ref 12.0–15.0)
Immature Granulocytes: 1 %
Lymphocytes Relative: 4 %
Lymphs Abs: 1 10*3/uL (ref 0.7–4.0)
MCH: 39.9 pg — ABNORMAL HIGH (ref 26.0–34.0)
MCHC: 33.1 g/dL (ref 30.0–36.0)
MCV: 120.6 fL — ABNORMAL HIGH (ref 80.0–100.0)
Monocytes Absolute: 0.8 10*3/uL (ref 0.1–1.0)
Monocytes Relative: 3 %
Neutro Abs: 21.9 10*3/uL — ABNORMAL HIGH (ref 1.7–7.7)
Neutrophils Relative %: 90 %
Platelets: 595 10*3/uL — ABNORMAL HIGH (ref 150–400)
RBC: 3.06 MIL/uL — ABNORMAL LOW (ref 3.87–5.11)
RDW: 14.7 % (ref 11.5–15.5)
WBC: 24.4 10*3/uL — ABNORMAL HIGH (ref 4.0–10.5)
nRBC: 0 % (ref 0.0–0.2)

## 2020-12-06 MED ORDER — OCTREOTIDE ACETATE 30 MG IM KIT
30.0000 mg | PACK | INTRAMUSCULAR | Status: AC
Start: 2020-12-06 — End: 2022-07-29
  Administered 2020-12-06: 30 mg via INTRAMUSCULAR
  Filled 2020-12-06: qty 1

## 2020-12-06 MED ORDER — XERMELO 250 MG PO TABS
ORAL_TABLET | ORAL | 0 refills | Status: DC
  Filled 2020-12-06: qty 84, 28d supply, fill #0

## 2020-12-07 ENCOUNTER — Other Ambulatory Visit (HOSPITAL_COMMUNITY): Payer: Self-pay

## 2020-12-09 ENCOUNTER — Other Ambulatory Visit (HOSPITAL_COMMUNITY): Payer: Self-pay

## 2020-12-09 LAB — CHROMOGRANIN A: Chromogranin A (ng/mL): 479.4 ng/mL — ABNORMAL HIGH (ref 0.0–101.8)

## 2020-12-10 ENCOUNTER — Other Ambulatory Visit (HOSPITAL_COMMUNITY): Payer: Self-pay

## 2020-12-14 MED ORDER — HYDROXYUREA 500 MG PO CAPS: 500.0000 mg | ORAL_CAPSULE | Freq: Every day | ORAL | 3 refills | Status: DC

## 2020-12-14 NOTE — Progress Notes (Signed)
Hematology/Oncology Consult note 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 Mike Gip for polycythemia vera/ET and she was on Hydrea for the same.  Her last visit with her as an outpatient was in May 2020.  She was taking hydroxyurea 500 mg 5 times a week.  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  Tumor was 2.5 cm with 9+ lymph nodes pathologic stage was T3N1.  Her chromogranin levels since then have been monitored regularly and they have been chronically elevated between 200-2 50.  Patient was noted to have liver lesions but did not have any significant metabolic uptake on PET CT scan in November 2019.  At her last visit with Dr. Mike Gip in May 2020 plan was to get dotatate PET scan followed by consideration for liver biopsy and at that time patient was lost to follow-up    Dotatate scan in February 2021 showed multiple areas of Liver metastases with intense radiotracer activity consistent with well-differentiated metastatic neuroendocrine tumor.  Single small peritoneal implant along ventral peritoneal surface.  No other evidence of malignancy.  Patient was started on octreotide in March 2021   Patient is also on Hydrea 5 times a week for  her JAK2 positive ET.  She has had significant pancytopenia with higher doses of Hydrea in the past   Interval history-patient's niece is with her today.  Patient is at her baseline and diarrhea is presently better controlled with Xermelo.  She does have intermittent falls.  ECOG PS- 3 Pain scale- 0   Review of systems- Review of Systems  Constitutional:  Positive for malaise/fatigue. Negative for chills, fever and weight loss.  HENT:  Negative for congestion, ear discharge and nosebleeds.   Eyes:  Negative for blurred vision.  Respiratory:  Negative for cough, hemoptysis, sputum production, shortness of breath and wheezing.   Cardiovascular:  Negative for chest pain, palpitations, orthopnea and claudication.  Gastrointestinal:  Negative for abdominal pain, blood in stool, constipation, diarrhea, heartburn, melena, nausea and vomiting.  Genitourinary:  Negative for dysuria, flank pain, frequency, hematuria and urgency.  Musculoskeletal:  Positive for falls. Negative for back pain, joint pain and myalgias.  Skin:  Negative for rash.  Neurological:  Negative for dizziness, tingling, focal weakness, seizures, weakness and headaches.  Endo/Heme/Allergies:  Does not bruise/bleed easily.  Psychiatric/Behavioral:  Negative for depression and suicidal ideas. The patient does not have insomnia.      Allergies  Allergen Reactions   Other Other (See Comments)    ALL OPIOIDS    Simvastatin Other (See Comments)    Myalgia     Past Medical History:  Diagnosis Date   Adenocarcinoma in situ of cervix    Arthritis    hands   Breast cancer ([place])    Colon cancer ([place])    Diabetes mellitus without  complication [facility])    Endometrial carcinoma ([place])    s/p total abdominal hysterectomy   H/O compression fracture of spine 2014   thoracic spine   H/O polycythemia vera    H/O TIA (transient ischemic attack) and stroke 09/2014, 03/2015   No deficits   Hypertension    Hyperuricemia     Microalbuminuria    Myocardial infarction ([place])    Polycythemia vera ([place])    Recurrent falls    Skin cancer    face, legs   Stroke ([place]) 2008   no deficits   Trochanteric bursitis    Varicose veins    treated     Past Surgical History:  Procedure Laterality Date   ABDOMINAL HYSTERECTOMY     BOWEL RESECTION N/A 03/28/2015   Procedure: SMALL BOWEL RESECTION;  Surgeon: Leonie Green, MD;  Location: [facility];  Service: General;  Laterality: N/A;   CATARACT EXTRACTION W/ INTRAOCULAR LENS IMPLANT Right    CATARACT EXTRACTION W/PHACO Left 10/07/2015   Procedure: CATARACT EXTRACTION PHACO AND INTRAOCULAR LENS PLACEMENT ([place]);  Surgeon: Ronnell Freshwater, MD;  Location: [place];  Service: Ophthalmology;  Laterality: Left;  DIABETIC - oral meds VISION BLUE   CORONARY ANGIOGRAPHY N/A 10/29/2017   Procedure: CORONARY ANGIOGRAPHY;  Surgeon: Dionisio David, MD;  Location: [place] [facility];  Service: Cardiovascular;  Laterality: N/A;   EXPLORATORY LAPAROTOMY     for fibroids   HIP ARTHROPLASTY Left 12/01/2018   Procedure: ARTHROPLASTY BIPOLAR HIP (HEMIARTHROPLASTY);  Surgeon: Corky Mull, MD;  Location: [facility];  Service: Orthopedics;  Laterality: Left;   LEFT HEART CATH Right 10/29/2017   Procedure: Left Heart Cath;  Surgeon: Dionisio David, MD;  Location: [place] [facility];  Service: Cardiovascular;  Laterality: Right;   TONSILLECTOMY      Social History   Socioeconomic History   Marital status: Widowed    Spouse name: Not on file   Number of children: Not on file   Years of education: Not on file   Highest education level: Not on file  Occupational History   Not on file  Tobacco Use   Smoking status: Former    Pack years: 0.00   Smokeless tobacco: Never  Vaping Use   Vaping Use: Never used  Substance and Sexual Activity   Alcohol use: No   Drug use: No   Sexual activity: Not Currently  Other Topics Concern   Not on file  Social History  Narrative   Not on file   Social Determinants of Health   Financial Resource Strain: Not on file  Food Insecurity: Not on file  Transportation Needs: Not on file  Physical Activity: Not on file  Stress: Not on file  Social Connections: Not on file  Intimate Partner Violence: Not on file    Family History  Problem Relation Age of Onset   Cancer Brother        AML   Heart attack Mother    Breast cancer Mother    Heart attack Father    Diabetes Father    Heart attack Sister    Diabetes Sister      Current Outpatient Medications:    acetaminophen (TYLENOL) 325 MG tablet, Take 1-2 tablets (325-650 mg total) by mouth every 6 (six) hours as needed for mild pain (pain score 1-3 or temp > 100.5). (Patient taking differently: Take 650 mg by mouth 3 (three) times daily.), Disp:  , Rfl:    amLODipine (NORVASC) 5  MG tablet, Take 1 tablet (5 mg total) by mouth daily., Disp: 30 tablet, Rfl: 1   calcium-vitamin D (OSCAL WITH D) 500-200 MG-UNIT per tablet, Take 1 tablet by mouth daily. , Disp: , Rfl:    carvedilol (COREG) 25 MG tablet, Take 1 tablet (25 mg total) by mouth 2 (two) times daily with a meal. (Patient taking differently: Take 12.5 mg by mouth 2 (two) times daily with a meal.), Disp: 60 tablet, Rfl: 0   clopidogrel (PLAVIX) 75 MG tablet, TAKE 1 TABLET BY MOUTH DAILY, Disp: 30 tablet, Rfl: 0   fexofenadine (ALLEGRA ALLERGY) 180 MG tablet, Take 1 tablet (180 mg total) by mouth daily., Disp: , Rfl:    fluticasone (FLONASE) 50 MCG/ACT nasal spray, Place 2 sprays into the nose daily., Disp: , Rfl:    furosemide (LASIX) 20 MG tablet, Take 20 mg by mouth daily., Disp: , Rfl:    hydroxyurea (HYDREA) 500 MG capsule, TAKE 1 CAPSULE BY MOUTH 6 DAYS A WEEK MONDAY THROUGH SATURDAY. NOT ON SUNDAY, Disp: 30 capsule, Rfl: 3   insulin detemir (LEVEMIR) 100 unit/ml SOLN, Inject 0.08 mLs (8 Units total) into the skin at bedtime. (Patient taking differently: Inject 12 Units into the skin at bedtime.),  Disp: 8 mL, Rfl: 1   octreotide (SANDOSTATIN LAR) 10 MG injection, Inject 10 mg into the muscle every 28 (twenty-eight) days., Disp: , Rfl:    pantoprazole (PROTONIX) 40 MG tablet, Take 1 tablet (40 mg total) by mouth daily., Disp: 30 tablet, Rfl: 0   pravastatin (PRAVACHOL) 20 MG tablet, Take 1 tablet (20 mg total) by mouth at bedtime., Disp: 30 tablet, Rfl: 0   QUEtiapine (SEROQUEL) 50 MG tablet, Take 50 mg by mouth at bedtime. , Disp: , Rfl:    ramipril (ALTACE) 5 MG capsule, Take 5 mg by mouth 2 (two) times daily., Disp: , Rfl:    sertraline (ZOLOFT) 50 MG tablet, Take 1 tablet (50 mg total) by mouth daily., Disp: 30 tablet, Rfl: 4   traMADol (ULTRAM) 50 MG tablet, 25 mg every other day. , Disp: , Rfl:    vitamin B-12 1000 MCG tablet, Take 1 tablet (1,000 mcg total) by mouth daily., Disp: 30 tablet, Rfl: 1   spironolactone (ALDACTONE) 25 MG tablet, Take 0.5 tablets (12.5 mg total) by mouth every other day for 30 days. (Patient taking differently: Take 12.5 mg by mouth See admin instructions. Take 12.5 mg by mouth twice daily every other day), Disp: 7.5 tablet, Rfl: 0   Telotristat Ethyl,as Etiprate, (XERMELO) 250 MG TABS, TAKE 1 TABLET (250 MG) BY MOUTH 3 (THREE) TIMES DAILY WITH MEALS., Disp: 84 tablet, Rfl: 0 No current facility-administered medications for this visit.  Facility-Administered Medications Ordered in Other Visits:    octreotide (SANDOSTATIN LAR) IM injection 30 mg, 30 mg, Intramuscular, Q30 days, Sindy Guadeloupe, MD, 30 mg at 10/30/19 1134   octreotide (SANDOSTATIN LAR) IM injection 30 mg, 30 mg, Intramuscular, Q30 days, Sindy Guadeloupe, MD, 30 mg at 12/29/19 1044   octreotide (SANDOSTATIN LAR) IM injection 30 mg, 30 mg, Intramuscular, Q30 days, Sindy Guadeloupe, MD, 30 mg at 02/02/20 1512   octreotide (SANDOSTATIN LAR) IM injection 30 mg, 30 mg, Intramuscular, Q30 days, Sindy Guadeloupe, MD, 30 mg at 05/06/20 1550   octreotide (SANDOSTATIN LAR) IM injection 30 mg, 30 mg,  Intramuscular, Q30 days, Sindy Guadeloupe, MD, 30 mg at 08/06/20 1518   octreotide (SANDOSTATIN LAR) IM injection 30 mg, 30 mg, Intramuscular, Q30 days,  Sindy Guadeloupe, MD, 30 mg at 09/05/20 1543   octreotide (SANDOSTATIN LAR) IM injection 30 mg, 30 mg, Intramuscular, Q30 days, Sindy Guadeloupe, MD, 30 mg at 12/06/20 1546  Physical exam:  Vitals:   12/06/20 1412  BP: 107/66  Pulse: 66  Resp: 17  Temp: (!) 96.3 F (35.7 C)  SpO2: 97%  Weight: 96 lb 4.8 oz (43.7 kg)  Height: 5' (1.524 m)   Physical Exam Constitutional:      Comments: Thin elderly frail woman sitting in a wheelchair.  Appears in no acute distress  Cardiovascular:     Rate and Rhythm: Normal rate and regular rhythm.     Heart sounds: Normal heart sounds.  Pulmonary:     Effort: Pulmonary effort is normal.     Breath sounds: Normal breath sounds.  Abdominal:     General: Bowel sounds are normal.     Palpations: Abdomen is soft.  Musculoskeletal:     Comments: Scattered bruising noted over bilateral upper extremities and lower extremities  Skin:    General: Skin is warm and dry.  Neurological:     Mental Status: She is alert and oriented to person, place, and time.     CMP Latest Ref Rng & Units 12/06/2020  Glucose 70 - 99 mg/dL 202(H)  BUN 8 - 23 mg/dL 29(H)  Creatinine 0.44 - 1.00 mg/dL 1.08(H)  Sodium 135 - 145 mmol/L 138  Potassium 3.5 - 5.1 mmol/L 4.2  Chloride 98 - 111 mmol/L 102  CO2 22 - 32 mmol/L 26  Calcium 8.9 - 10.3 mg/dL 8.4(L)  Total Protein 6.5 - 8.1 g/dL 5.6(L)  Total Bilirubin 0.3 - 1.2 mg/dL 0.6  Alkaline Phos 38 - 126 U/L 92  AST 15 - 41 U/L 29  ALT 0 - 44 U/L 44   CBC Latest Ref Rng & Units 12/06/2020  WBC 4.0 - 10.5 K/uL 24.4(H)  Hemoglobin 12.0 - 15.0 g/dL 12.2  Hematocrit 36.0 - 46.0 % 36.9  Platelets 150 - 400 K/uL 595(H)     Assessment and plan- Patient is a 87 y.o. female here for follow-up of following issues:  Essential thrombocytosis: Patient's platelet count and white  cell count is up today and gradually increasing over the last 4 months.  She is presently on Hydrea 6 days a week.  We will send a new prescription to increase Hydrea 7 days a week.  2.  Carcinoid tumor: Patient is not interested in going to [facility] for [place] treatment.  She will therefore remain on monthly octreotide and as needed Xermelo for her diarrhea.  She is not a candidate for everolimus or capecitabine down the line.  I will see her back in 3 months with CBC with differential CMP and chromogranin A levels.  She will continue to get monthly octreotide.  CT scans prior to her visit   Visit Diagnosis 1. Carcinoid tumor of colon   2. Encounter for monitoring octreotide therapy   3. Polycythemia vera ([place])   4. High risk medication use      Dr. Randa Evens, MD, MPH [facility] [phone] 12/14/2020 3:32 PM

## 2020-12-16 ENCOUNTER — Telehealth: Payer: Self-pay

## 2020-12-16 ENCOUNTER — Other Ambulatory Visit: Payer: Self-pay

## 2020-12-16 DIAGNOSIS — Z79899 Other long term (current) drug therapy: Secondary | ICD-10-CM

## 2020-12-16 MED ORDER — HYDROXYUREA 500 MG PO CAPS: 500.0000 mg | ORAL_CAPSULE | Freq: Every day | ORAL | 0 refills | Status: DC

## 2021-01-03 ENCOUNTER — Other Ambulatory Visit (HOSPITAL_COMMUNITY): Payer: Self-pay

## 2021-01-07 ENCOUNTER — Telehealth: Payer: Self-pay | Admitting: Pharmacy Technician

## 2021-01-07 NOTE — Telephone Encounter (Signed)
Oral Oncology Patient Advocate Encounter   Received notification from [facility] that the existing prior authorization for [name] is due for renewal.   Renewal PA submitted on CoverMyMeds Key [id number] Status is pending   Oral Oncology Clinic will continue to follow.  [name] Patient [name] Phone [phone] Fax [phone] 01/07/2021 11:09 AM

## 2021-01-07 NOTE — Telephone Encounter (Signed)
Oral Oncology Patient Advocate Encounter  Prior Authorization for [name] has been approved.    PA# [id number] Effective dates: 01/07/21 through 07/06/21  Oral Oncology Clinic will continue to follow.   [name] Phone [phone] Fax [phone] 01/07/2021 11:16 AM

## 2021-01-10 ENCOUNTER — Other Ambulatory Visit: Payer: Self-pay

## 2021-01-10 ENCOUNTER — Inpatient Hospital Stay: Payer: Medicare PPO

## 2021-01-10 ENCOUNTER — Inpatient Hospital Stay: Payer: Medicare PPO | Attending: Oncology

## 2021-01-10 DIAGNOSIS — D473 Essential (hemorrhagic) thrombocythemia: Secondary | ICD-10-CM | POA: Insufficient documentation

## 2021-01-10 DIAGNOSIS — C7B02 Secondary carcinoid tumors of liver: Secondary | ICD-10-CM | POA: Diagnosis present

## 2021-01-10 DIAGNOSIS — C7A012 Malignant carcinoid tumor of the ileum: Secondary | ICD-10-CM | POA: Diagnosis present

## 2021-01-10 DIAGNOSIS — Z79899 Other long term (current) drug therapy: Secondary | ICD-10-CM

## 2021-01-10 DIAGNOSIS — Z5181 Encounter for therapeutic drug level monitoring: Secondary | ICD-10-CM

## 2021-01-10 DIAGNOSIS — D45 Polycythemia vera: Secondary | ICD-10-CM

## 2021-01-10 LAB — CBC WITH DIFFERENTIAL/PLATELET
Abs Immature Granulocytes: 0.28 10*3/uL — ABNORMAL HIGH (ref 0.00–0.07)
Basophils Absolute: 0.2 10*3/uL — ABNORMAL HIGH (ref 0.0–0.1)
Basophils Relative: 1 %
Eosinophils Absolute: 0.2 10*3/uL (ref 0.0–0.5)
Eosinophils Relative: 1 %
HCT: 39.5 % (ref 36.0–46.0)
Hemoglobin: 13.5 g/dL (ref 12.0–15.0)
Immature Granulocytes: 1 %
Lymphocytes Relative: 4 %
Lymphs Abs: 1.1 10*3/uL (ref 0.7–4.0)
MCH: 40.3 pg — ABNORMAL HIGH (ref 26.0–34.0)
MCHC: 34.2 g/dL (ref 30.0–36.0)
MCV: 117.9 fL — ABNORMAL HIGH (ref 80.0–100.0)
Monocytes Absolute: 1.1 10*3/uL — ABNORMAL HIGH (ref 0.1–1.0)
Monocytes Relative: 4 %
Neutro Abs: 26.4 10*3/uL — ABNORMAL HIGH (ref 1.7–7.7)
Neutrophils Relative %: 89 %
Platelets: 587 10*3/uL — ABNORMAL HIGH (ref 150–400)
RBC: 3.35 MIL/uL — ABNORMAL LOW (ref 3.87–5.11)
RDW: 15.9 % — ABNORMAL HIGH (ref 11.5–15.5)
WBC: 29.3 10*3/uL — ABNORMAL HIGH (ref 4.0–10.5)
nRBC: 0 % (ref 0.0–0.2)

## 2021-01-10 MED ORDER — OCTREOTIDE ACETATE 30 MG IM KIT
30.0000 mg | PACK | INTRAMUSCULAR | Status: DC
  Administered 2021-01-10: 30 mg via INTRAMUSCULAR
  Filled 2021-01-10: qty 1

## 2021-01-13 ENCOUNTER — Telehealth: Payer: Self-pay

## 2021-01-13 ENCOUNTER — Other Ambulatory Visit: Payer: Self-pay

## 2021-01-13 MED ORDER — HYDROXYUREA 500 MG PO CAPS: ORAL_CAPSULE | ORAL | 1 refills | Status: DC

## 2021-01-13 NOTE — Telephone Encounter (Signed)
Tried to contact pt to make aware of medication change for hydroxyrea per Dr. Janese Banks she will like for pt to take '500mg'$  and alternate '1000mg'$ . A new rx was sent to [facility].   Monday-'500mg'$ , Tuesday '1000mg'$ , Wednesday-'500mg'$ , Thursday-'1000mg'$ ,Friday '500mg'$ , Saturday '1000mg'$ , and Sunday '500mg'$ .  Left VM for pt to return call if not we will try again tomorrow to make sure she is aware of change.

## 2021-01-13 NOTE — Progress Notes (Signed)
Please have her take hydrea 500 mg alternating with 1000 mg and repeat cbc with diff in 1 month

## 2021-01-13 NOTE — Telephone Encounter (Signed)
Per [facility]; spoke with Amy/Tammy and it was verified that pt is now taking hydroxyurea everyday with food and no longer skipping on Sundays.

## 2021-02-03 ENCOUNTER — Other Ambulatory Visit (HOSPITAL_COMMUNITY): Payer: Self-pay

## 2021-02-03 ENCOUNTER — Other Ambulatory Visit: Payer: Self-pay | Admitting: Oncology

## 2021-02-03 MED ORDER — XERMELO 250 MG PO TABS
ORAL_TABLET | ORAL | 0 refills | Status: DC
  Filled 2021-02-03: qty 84, 28d supply, fill #0

## 2021-02-05 ENCOUNTER — Other Ambulatory Visit (HOSPITAL_COMMUNITY): Payer: Self-pay

## 2021-02-06 ENCOUNTER — Other Ambulatory Visit (HOSPITAL_COMMUNITY): Payer: Self-pay

## 2021-02-07 ENCOUNTER — Inpatient Hospital Stay: Payer: Medicare PPO | Attending: Oncology

## 2021-02-07 DIAGNOSIS — C7A012 Malignant carcinoid tumor of the ileum: Secondary | ICD-10-CM | POA: Insufficient documentation

## 2021-02-07 DIAGNOSIS — C7B02 Secondary carcinoid tumors of liver: Secondary | ICD-10-CM | POA: Insufficient documentation

## 2021-02-07 DIAGNOSIS — D45 Polycythemia vera: Secondary | ICD-10-CM

## 2021-02-07 MED ORDER — OCTREOTIDE ACETATE 30 MG IM KIT
30.0000 mg | PACK | INTRAMUSCULAR | Status: DC
  Administered 2021-02-07: 30 mg via INTRAMUSCULAR
  Filled 2021-02-07: qty 1

## 2021-02-12 ENCOUNTER — Other Ambulatory Visit: Payer: Self-pay

## 2021-02-12 ENCOUNTER — Encounter: Payer: Self-pay | Admitting: Adult Health

## 2021-02-12 ENCOUNTER — Ambulatory Visit: Payer: Medicare PPO | Admitting: Adult Health

## 2021-02-12 VITALS — BP 127/78 | HR 67 | Ht 60.0 in | Wt 85.0 lb

## 2021-02-12 DIAGNOSIS — F0150 Vascular dementia without behavioral disturbance: Secondary | ICD-10-CM | POA: Diagnosis not present

## 2021-02-12 DIAGNOSIS — R296 Repeated falls: Secondary | ICD-10-CM | POA: Diagnosis not present

## 2021-02-12 DIAGNOSIS — I614 Nontraumatic intracerebral hemorrhage in cerebellum: Secondary | ICD-10-CM | POA: Diagnosis not present

## 2021-02-12 NOTE — Patient Instructions (Addendum)
Continue to follow with pain management for continued knee pain which is likely largely contributing to gait difficulties and increased falls  Continue clopidogrel 75 mg daily  and pravastatin for secondary stroke prevention  Continue to follow up with PCP regarding cholesterol and blood pressure management  Maintain strict control of hypertension with blood pressure goal below 130/90 and cholesterol with LDL cholesterol (bad cholesterol) goal below 70 mg/dL.       Followup in the future with me in 6 months.  Or call earlier if needed       Thank you for coming to see [place] at [facility]. I hope we have been able to provide you high quality care today.  You may receive a patient satisfaction survey over the next few weeks. We would appreciate your feedback and comments so that we may continue to improve ourselves and the health of our patients.

## 2021-02-12 NOTE — Progress Notes (Signed)
[facility] [address] [place]. [place] [address] [phone]       STROKE FOLLOW UP NOTE  [name] Date of Birth:  November 29, 1931 Medical Record Number:  [id number]   Reason for visit: stroke follow up  Chief complaint: Chief Complaint  Patient presents with   Follow-up    RM 2 with Niece [name]). Pt reports her knee has been painful recently. 3-4 falls since last visit. Reports a decline in memory has been noted and not sleeping as well at night     HPI:  Today, 02/12/2021, [name] returns for 13-monthstroke follow-up accompanied by her niece, [name]  Overall stable from stroke standpoint.  Greatest concern today is in regards to R>L knee pain with increased falls.  She routinely follows with pain clinic and has follow-up visit coming up soon - possibly considering knee replacement per niece.  Memory continues to decline.  Denies behavioral concerns.  Occasional difficulty sleeping throughout the night.  Compliant on Plavix and pravastatin without side effects.  Blood pressure today 127/78.  Routinely monitors at home.  No further concerns at this time.    History provided for reference purposes only Update 08/07/2020 JM: [name] for 666-monthtroke follow-up accompanied by her niece.  Stable since prior visit without new stroke/TIA symptoms with residual dysarthria and cognitive impairment Per niece, cognition has gradually declined from prior visit MMSE today Denies any behavioral concerns  Continues on Plavix and pravastatin for secondary stroke prevention Blood pressure today 90/55 - monitors at home and typically 140s/70s Per niece, she was recently started on diuretics due to BLE swelling and having issues with diarrhea which is likely contributing to her low blood pressure this afternoon No further concerns at this time  Update 01/31/2020 JM: [name] for stroke follow-up accompanied by her niece. Residual deficits of  dysarthria and cognitive impairment.  Dysarthria has been stable.  Niece does believe that her cognition has been slowly declining with occasional confusional episodes, worsening short-term memory and occasional word finding difficulty.  Continues on Seroquel 50 mg nightly and sertraline managed by PCP.  No behavioral related concerns.  No additional hallucinations as initiating Seroquel.  MMSE today 21/30.  Denies new or worsening stroke/TIA symptoms Continues on Plavix and pravastatin without side effects.  Blood pressure today 145/83.  Recent A1c 7.6.  HLD, HTN and DM managed by PCP. 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  Continues on octreotide No further concerns   Update 10/04/2019 JM: [name] a [age]ear old female who is being seen today, 10/04/2019, for stroke follow-up accompanied by her niece.  She has been stable from a stroke standpoint with residual stroke deficits of dysarthria and cognitive impairment.  Continues on sertraline 50 mg daily with stable mood as well as Seroquel 50 mg nightly which was recently increased by PCP due to sleeping difficulties with significant benefit.  Recently being followed by pain management due to bilateral knee pain R >L which limits ambulation and has been started on tramadol 25 mg every other day with benefit.  Shortly after prior visit, she presented to ED on 07/18/2019 after mechanical fall and generalized weakness and found to have leukopenia and anemia likely secondary to hydroxyurea toxicity requiring transfusion.  She continues to follow with oncology regularly for polycythemia and use of hydroxyurea.  She continues to live in her own home  but does have 24-hour caregivers.  Continues on Plavix and pravastatin for stroke prevention and blood pressure today 121/72.  No further concerns at this time.  Virtual visit 07/05/2019 JM: Ms.  [name] is a 87 year old female who is being seen today via virtual visit for stroke follow-up accompanied by her niece.  She has not been seen in over 1 year due to COVID-19 safety precautions.  Residual deficits of dysarthria with hesitancy with wax/wane of symptoms as well as cognitive impairment.  Per daughter, she feels as though her speech has been gradually worsening as far as speaking slower or stuttering as well as worsening memory loss.  She does endorse increased stress and possible depression/anxiety with concerns of COVID-19 pandemic along with constantly being in pain with ongoing chronic knee pain.  She continues to receive 24-hour care for ongoing assistance.  She has continued on Plavix and pravastatin for secondary stroke prevention without side effects.  Prior A1c 7.0 on 11/30/2018.  Blood pressure 128-130/60-70. She unfortunately suffered a close subcapital fracture left femur after a fall on 11/30/2018 requiring open reduction internal fixation procedure. Healed well from hip stand point. Pain in her left knee possibly from arthritis per patient. Needs walker for ambulation. Has been folowing up with ortho with receiving injections without benefit.  Recently referred to pain management clinic for ongoing management.  Denies new or worsening stroke/TIA symptoms.  Interval history 03/23/18 JM: Patient is being seen today for scheduled follow-up visit and is accompanied by her niece.  She continues to have some stuttering concerns and right hand weakness but has been improving.  She participates in PT/OT/ST at [facility] and continues to live independently with 24/7 care.  Aids to assist with ADLs and IADLs.  Patient is questioning whether therapy continued at this time.  She continues to take Plavix only without side effects of bleeding or bruising.  She was planning on undergoing stent procedure by cardiologist at [facility] but this ended up being canceled and scheduled appointment with  cardiologist through [facility] system who believes there is no benefit for stent placement and recommended continuation of medication management and monitoring.  Continues to take pravastatin without side effects myalgias.  Blood pressure today satisfactory 117/67.  Niece does state that current cardiologist has been managing antihypertensives and continues to fluctuate with SBP ranging from 1 10-1 60.  Denies new or worsening stroke/TIA symptoms.  Stroke admission 10/29/2017: [name] is an 87 y.o. female with a PMH of Stroke, Recurrent Falls, Polycythemia Vera, Microalbuminuria, HTN, TIA, Endometrial Carcinoma, Diabetes Mellitus, Colon Cancer, Breast Cancer, Arthritis, and Adenocarcinoma in Situ of Cervix presented to [facility] hospital with black emesis and unresponsive 2/2 hypoxia to [facility] on 10/28/17. CTA Chest revealed pericardial effusion, small pleural effusions, and pulmonary edema. She was treated for sepsis secondary to pneumonia. She is on ASA and Plavix.  She was noted to have NSTEMI and underwent cardiac cath which showed 3 vessel disease and severe LV dysfunction. Following cath procedure patient had a head CT which showed right cerebellar hemorrhage with IVH. She was supposed to receive heparin drip, but was held. Patient transferred to [facility] hospital for further management. Hemorrhage was felt to be secondary to hypertensive etiology on dual antiplatelet therapy.  Neuro wise, she was at risk for cerebral edema and treated with 3%.  There was no neurosurgery intervention that would benefit her.  She continued to progress though still had issues with pulmonary edema and CHF.  Developed leukocytosis, etiology  unknown, but resolved during admission.  Once stable, was transferred to the inpatient rehab for ongoing therapy.  Plans are for PCI with stent in the near future. Recommended cardiology to follow-up in 2 weeks. Recommended to resume aspirin at time of discharge from inpatient (7 days) and  resume Plavix in 3 weeks.  12/17/17 visit JM: Patient is being seen today for hospital follow-up and is accompanied by her niece.  She continues to improve from a neurological standpoint but does have mild right-sided weakness along with dysarthria.  She continues to participate in home PT/OT/ST.  She does live independently but does have 24/7 care.  Continues to take aspirin 81 mg at this time without bleeding or bruising.  They had not start Plavix as they state they need clearance from [place] first.  Continues to take pravastatin without myalgias.  Per niece, cardiologist states she needs to be on Plavix for 2 weeks prior to being considered for stent placement.  Blood pressure today satisfactory 124/69.  Patient denies new or worsening stroke/TIA symptoms.       ROS:   14 system review of systems performed and negative with exception of those listed in HPI  PMH:  Past Medical History:  Diagnosis Date   Adenocarcinoma in situ of cervix    Arthritis    hands   Breast cancer ([place])    Colon cancer ([place])    Diabetes mellitus without complication ([place])    Endometrial carcinoma ([place])    s/p total abdominal hysterectomy   H/O compression fracture of spine 2014   thoracic spine   H/O polycythemia vera    H/O TIA (transient ischemic attack) and stroke 09/2014, 03/2015   No deficits   Hypertension    Hyperuricemia    Microalbuminuria    Myocardial infarction ([place])    Polycythemia vera ([place])    Recurrent falls    Skin cancer    face, legs   Stroke ([place]) 2008   no deficits   Trochanteric bursitis    Varicose veins    treated    PSH:  Past Surgical History:  Procedure Laterality Date   ABDOMINAL HYSTERECTOMY     BOWEL RESECTION N/A 03/28/2015   Procedure: SMALL BOWEL RESECTION;  Surgeon: Leonie Green, MD;  Location: [facility] ORS;  Service: General;  Laterality: N/A;   CATARACT EXTRACTION W/ INTRAOCULAR LENS IMPLANT Right    CATARACT EXTRACTION W/PHACO Left 10/07/2015   Procedure:  CATARACT EXTRACTION PHACO AND INTRAOCULAR LENS PLACEMENT ([place]);  Surgeon: Ronnell Freshwater, MD;  Location: [facility];  Service: Ophthalmology;  Laterality: Left;  DIABETIC - oral meds VISION BLUE   CORONARY ANGIOGRAPHY N/A 10/29/2017   Procedure: CORONARY ANGIOGRAPHY;  Surgeon: Dionisio David, MD;  Location: [place] [facility];  Service: Cardiovascular;  Laterality: N/A;   EXPLORATORY LAPAROTOMY     for fibroids   HIP ARTHROPLASTY Left 12/01/2018   Procedure: ARTHROPLASTY BIPOLAR HIP (HEMIARTHROPLASTY);  Surgeon: Corky Mull, MD;  Location: [facility];  Service: Orthopedics;  Laterality: Left;   LEFT HEART CATH Right 10/29/2017   Procedure: Left Heart Cath;  Surgeon: Dionisio David, MD;  Location: [facility];  Service: Cardiovascular;  Laterality: Right;   TONSILLECTOMY      Social History:  Social History   Socioeconomic History   Marital status: Widowed    Spouse name: Not on file   Number of children: Not on file   Years of education: Not on file   Highest education  level: Not on file  Occupational History   Not on file  Tobacco Use   Smoking status: Former   Smokeless tobacco: Never  Vaping Use   Vaping Use: Never used  Substance and Sexual Activity   Alcohol use: No   Drug use: No   Sexual activity: Not Currently  Other Topics Concern   Not on file  Social History Narrative   Not on file   Social Determinants of Health   Financial Resource Strain: Not on file  Food Insecurity: Not on file  Transportation Needs: Not on file  Physical Activity: Not on file  Stress: Not on file  Social Connections: Not on file  Intimate Partner Violence: Not on file    Family History:  Family History  Problem Relation Age of Onset   Cancer Brother        AML   Heart attack Mother    Breast cancer Mother    Heart attack Father    Diabetes Father    Heart attack Sister    Diabetes Sister     Medications:   Current Outpatient Medications on  File Prior to Visit  Medication Sig Dispense Refill   acetaminophen (TYLENOL) 325 MG tablet Take 1-2 tablets (325-650 mg total) by mouth every 6 (six) hours as needed for mild pain (pain score 1-3 or temp > 100.5). (Patient taking differently: Take 650 mg by mouth 3 (three) times daily.)     amLODipine (NORVASC) 5 MG tablet Take 1 tablet (5 mg total) by mouth daily. 30 tablet 1   calcium-vitamin D (OSCAL WITH D) 500-200 MG-UNIT per tablet Take 1 tablet by mouth daily.      carvedilol (COREG) 25 MG tablet Take 1 tablet (25 mg total) by mouth 2 (two) times daily with a meal. (Patient taking differently: Take 12.5 mg by mouth 2 (two) times daily with a meal.) 60 tablet 0   clopidogrel (PLAVIX) 75 MG tablet TAKE 1 TABLET BY MOUTH DAILY 30 tablet 0   Docusate Sodium (STOOL SOFTENER LAXATIVE PO) Take by mouth.     fexofenadine (ALLEGRA ALLERGY) 180 MG tablet Take 1 tablet (180 mg total) by mouth daily.     fluticasone (FLONASE) 50 MCG/ACT nasal spray Place 2 sprays into the nose daily.     furosemide (LASIX) 20 MG tablet Take 20 mg by mouth daily.     hydroxyurea (HYDREA) 500 MG capsule Take '500mg'$  on Monday, Wednesday,Friday and Sunday.Take '1000mg'$  on Tuesday, Thursday and Saturdays. May take with food to minimize GI side effects. 40 capsule 1   insulin detemir (LEVEMIR) 100 unit/ml SOLN Inject 0.08 mLs (8 Units total) into the skin at bedtime. (Patient taking differently: Inject 12 Units into the skin at bedtime.) 8 mL 1   octreotide (SANDOSTATIN LAR) 10 MG injection Inject 10 mg into the muscle every 28 (twenty-eight) days.     pantoprazole (PROTONIX) 40 MG tablet Take 1 tablet (40 mg total) by mouth daily. 30 tablet 0   pravastatin (PRAVACHOL) 20 MG tablet Take 1 tablet (20 mg total) by mouth at bedtime. 30 tablet 0   QUEtiapine (SEROQUEL) 50 MG tablet Take 50 mg by mouth at bedtime.      ramipril (ALTACE) 5 MG capsule Take 5 mg by mouth 2 (two) times daily.     sertraline (ZOLOFT) 50 MG tablet Take 1  tablet (50 mg total) by mouth daily. 30 tablet 4   Telotristat Ethyl,as Etiprate, (XERMELO) 250 MG TABS TAKE 1 TABLET (250 MG)  BY MOUTH 3 (THREE) TIMES DAILY WITH MEALS. 84 tablet 0   traMADol (ULTRAM) 50 MG tablet 25 mg every other day.      spironolactone (ALDACTONE) 25 MG tablet Take 0.5 tablets (12.5 mg total) by mouth every other day for 30 days. (Patient taking differently: Take 12.5 mg by mouth See admin instructions. Take 12.5 mg by mouth twice daily every other day) 7.5 tablet 0   Current Facility-Administered Medications on File Prior to Visit  Medication Dose Route Frequency Provider Last Rate Last Admin   octreotide (SANDOSTATIN LAR) IM injection 30 mg  30 mg Intramuscular Q30 days Sindy Guadeloupe, MD   30 mg at 10/30/19 1134   octreotide (SANDOSTATIN LAR) IM injection 30 mg  30 mg Intramuscular Q30 days Sindy Guadeloupe, MD   30 mg at 12/29/19 1044   octreotide (SANDOSTATIN LAR) IM injection 30 mg  30 mg Intramuscular Q30 days Sindy Guadeloupe, MD   30 mg at 02/02/20 1512   octreotide (SANDOSTATIN LAR) IM injection 30 mg  30 mg Intramuscular Q30 days Sindy Guadeloupe, MD   30 mg at 05/06/20 1550   octreotide (SANDOSTATIN LAR) IM injection 30 mg  30 mg Intramuscular Q30 days Sindy Guadeloupe, MD   30 mg at 08/06/20 1518   octreotide (SANDOSTATIN LAR) IM injection 30 mg  30 mg Intramuscular Q30 days Sindy Guadeloupe, MD   30 mg at 09/05/20 1543   octreotide (SANDOSTATIN LAR) IM injection 30 mg  30 mg Intramuscular Q30 days Sindy Guadeloupe, MD   30 mg at 12/06/20 1546    Allergies:   Allergies  Allergen Reactions   Other Other (See Comments)    ALL OPIOIDS    Simvastatin Other (See Comments)    Myalgia     Vitals: Today's Vitals   02/12/21 1544  BP: 127/78  Pulse: 67  Weight: 85 lb (38.6 kg)  Height: 5' (1.524 m)   Body mass index is 16.6 kg/m.   Physical Exam  General: Frail very pleasant elderly Caucasian female, seated, in no evident distress Head: head normocephalic and  atraumatic.   Neck: supple with no carotid or supraclavicular bruits Cardiovascular: regular rate and rhythm, no murmurs Musculoskeletal: limited b/l knee movement d/t pain Skin:  no rash/petichiae; skin tears Vascular:  Normal pulses all extremities   Neurologic Exam Mental Status: Awake and fully alert. Mild to moderate dysarthria with occasional stuttering. Recent memory impaired and remote memory intact. Mood and affect appropriate.   MMSE - Mini Mental State Exam 02/12/2021 08/07/2020 08/07/2020  Orientation to time '2 1 1  '$ Orientation to Place 0 2 -  Registration 3 3 -  Attention/ Calculation 0 0 -  Recall 1 0 -  Language- name 2 objects 0 1 -  Language- repeat 1 1 -  Language- follow 3 step command 3 3 -  Language- read & follow direction 1 1 -  Write a sentence 0 1 -  Copy design 0 0 -  Total score 11 13 -   Cranial Nerves: Pupils equal, briskly reactive to light. Extraocular movements full without nystagmus. Visual fields full to confrontation. Hearing intact. Facial sensation intact. Face, tongue, palate moves normally and symmetrically.  Motor: Normal bulk and tone. Normal strength in all tested extremity muscles. Sensory.: intact to touch , pinprick , position and vibratory sensation.  Coordination: Rapid alternating movements normal in all extremities. Finger-to-nose performed accurately bilaterally and heel-to-shin difficulty performing due to bilateral knee pain Gait and  Station: Arises from chair without difficulty. Stance is hunched. Gait demonstrates short cautious steps with use of Rollator walker.  Tandem walk and heel toe Reflexes: 1+ and symmetric. Toes downgoing.          ASSESSMENT/PLAN: [name] is a 87 y.o. year old female here with right cerebellar parenchymal hemorrhage with IVH on 10/31/17 secondary to hypertensive plus DAPT with residual dysarthria and cognitive impairment. Vascular risk factors include HTN, DM, CAD, decreased EF, CHF  thrombocythemia and metastatic malignant carcinoid tumor to liver currently on octreotide.     R cerebellar IPH -Continue Plavix 75 mg daily and pravastatin for secondary stroke prevention -F/u with PCP regarding your HLD, HTN and DM management  Vascular dementia -MMSE today 11/30 (prior 13/30) -gradual decline - discussed increased knee pain possibly contributing -No behavioral concerns -Discussed importance of routine activity (as tolerated), adequate sleep and healthy diet as well as managing stroke risk factors and continuing brain exercises  Increased falls -Likely in setting of bilateral knee pain -Continue to follow with pain management possibly considering knee replacement -Discussed use of rolling walker at all times and may benefit from w/c for more long distance ambulation due to high fall risk     Follow up in 6 months or call earlier if needed   CC:  [facility] provider: Dr. Andrey Farmer, Chrystie Nose, MD    I spent 32 minutes of face-to-face and non-face-to-face time with patient and niece.  This included previsit chart review, lab review, study review, electronic health record documentation, patient education regarding prior stroke, secondary stroke prevention measures and managing stroke risk factors, residual deficits including cognitive impairment with performing and reviewing MMSE, increased falls with knee pain, and answering all questions to patient and nieces satisfaction  Frann Rider, [facility]  [facility] [address] [place], [place] [address]  Phone [phone] Fax [phone]

## 2021-02-12 NOTE — Progress Notes (Signed)
I agree with the above plan 

## 2021-02-25 ENCOUNTER — Other Ambulatory Visit (HOSPITAL_COMMUNITY): Payer: Self-pay

## 2021-02-28 ENCOUNTER — Other Ambulatory Visit (HOSPITAL_COMMUNITY): Payer: Self-pay

## 2021-03-09 ENCOUNTER — Other Ambulatory Visit: Payer: Self-pay | Admitting: *Deleted

## 2021-03-09 DIAGNOSIS — C787 Secondary malignant neoplasm of liver and intrahepatic bile duct: Secondary | ICD-10-CM

## 2021-03-09 DIAGNOSIS — D3A029 Benign carcinoid tumor of the large intestine, unspecified portion: Secondary | ICD-10-CM

## 2021-03-11 ENCOUNTER — Encounter: Payer: Self-pay | Admitting: Oncology

## 2021-03-11 ENCOUNTER — Inpatient Hospital Stay: Payer: Medicare PPO

## 2021-03-11 ENCOUNTER — Inpatient Hospital Stay: Payer: Medicare PPO | Attending: Oncology

## 2021-03-11 ENCOUNTER — Inpatient Hospital Stay (HOSPITAL_BASED_OUTPATIENT_CLINIC_OR_DEPARTMENT_OTHER): Payer: Medicare PPO | Admitting: Oncology

## 2021-03-11 VITALS — BP 121/63 | HR 55 | Temp 97.4°F | Resp 16 | Wt 85.0 lb

## 2021-03-11 DIAGNOSIS — Z79899 Other long term (current) drug therapy: Secondary | ICD-10-CM

## 2021-03-11 DIAGNOSIS — Z794 Long term (current) use of insulin: Secondary | ICD-10-CM | POA: Insufficient documentation

## 2021-03-11 DIAGNOSIS — I252 Old myocardial infarction: Secondary | ICD-10-CM | POA: Insufficient documentation

## 2021-03-11 DIAGNOSIS — D3A029 Benign carcinoid tumor of the large intestine, unspecified portion: Secondary | ICD-10-CM

## 2021-03-11 DIAGNOSIS — Z5181 Encounter for therapeutic drug level monitoring: Secondary | ICD-10-CM | POA: Diagnosis not present

## 2021-03-11 DIAGNOSIS — I10 Essential (primary) hypertension: Secondary | ICD-10-CM | POA: Diagnosis not present

## 2021-03-11 DIAGNOSIS — D473 Essential (hemorrhagic) thrombocythemia: Secondary | ICD-10-CM | POA: Diagnosis present

## 2021-03-11 DIAGNOSIS — C7B02 Secondary carcinoid tumors of liver: Secondary | ICD-10-CM | POA: Insufficient documentation

## 2021-03-11 DIAGNOSIS — C7A012 Malignant carcinoid tumor of the ileum: Secondary | ICD-10-CM | POA: Insufficient documentation

## 2021-03-11 DIAGNOSIS — D45 Polycythemia vera: Secondary | ICD-10-CM | POA: Insufficient documentation

## 2021-03-11 DIAGNOSIS — E119 Type 2 diabetes mellitus without complications: Secondary | ICD-10-CM | POA: Diagnosis not present

## 2021-03-11 DIAGNOSIS — C787 Secondary malignant neoplasm of liver and intrahepatic bile duct: Secondary | ICD-10-CM

## 2021-03-11 LAB — CBC WITH DIFFERENTIAL/PLATELET
Abs Immature Granulocytes: 0.06 10*3/uL (ref 0.00–0.07)
Basophils Absolute: 0.1 10*3/uL (ref 0.0–0.1)
Basophils Relative: 1 %
Eosinophils Absolute: 0.1 10*3/uL (ref 0.0–0.5)
Eosinophils Relative: 1 %
HCT: 30.5 % — ABNORMAL LOW (ref 36.0–46.0)
Hemoglobin: 10.1 g/dL — ABNORMAL LOW (ref 12.0–15.0)
Immature Granulocytes: 1 %
Lymphocytes Relative: 10 %
Lymphs Abs: 0.6 10*3/uL — ABNORMAL LOW (ref 0.7–4.0)
MCH: 41.9 pg — ABNORMAL HIGH (ref 26.0–34.0)
MCHC: 33.1 g/dL (ref 30.0–36.0)
MCV: 126.6 fL — ABNORMAL HIGH (ref 80.0–100.0)
Monocytes Absolute: 0.2 10*3/uL (ref 0.1–1.0)
Monocytes Relative: 3 %
Neutro Abs: 5.6 10*3/uL (ref 1.7–7.7)
Neutrophils Relative %: 84 %
Platelets: 215 10*3/uL (ref 150–400)
RBC: 2.41 MIL/uL — ABNORMAL LOW (ref 3.87–5.11)
RDW: 20.7 % — ABNORMAL HIGH (ref 11.5–15.5)
Smear Review: NORMAL
WBC: 6.5 10*3/uL (ref 4.0–10.5)
nRBC: 0 % (ref 0.0–0.2)

## 2021-03-11 LAB — COMPREHENSIVE METABOLIC PANEL
ALT: 149 U/L — ABNORMAL HIGH (ref 0–44)
AST: 111 U/L — ABNORMAL HIGH (ref 15–41)
Albumin: 3.4 g/dL — ABNORMAL LOW (ref 3.5–5.0)
Alkaline Phosphatase: 68 U/L (ref 38–126)
Anion gap: 6 (ref 5–15)
BUN: 40 mg/dL — ABNORMAL HIGH (ref 8–23)
CO2: 31 mmol/L (ref 22–32)
Calcium: 8.5 mg/dL — ABNORMAL LOW (ref 8.9–10.3)
Chloride: 99 mmol/L (ref 98–111)
Creatinine, Ser: 1.33 mg/dL — ABNORMAL HIGH (ref 0.44–1.00)
GFR, Estimated: 39 mL/min — ABNORMAL LOW (ref >60)
Glucose, Bld: 274 mg/dL — ABNORMAL HIGH (ref 70–99)
Potassium: 3.7 mmol/L (ref 3.5–5.1)
Sodium: 136 mmol/L (ref 135–145)
Total Bilirubin: 0.7 mg/dL (ref 0.3–1.2)
Total Protein: 5.7 g/dL — ABNORMAL LOW (ref 6.5–8.1)

## 2021-03-11 MED ORDER — OCTREOTIDE ACETATE 30 MG IM KIT
30.0000 mg | PACK | INTRAMUSCULAR | Status: AC
  Administered 2021-03-11: 30 mg via INTRAMUSCULAR
  Filled 2021-03-11: qty 1

## 2021-03-11 NOTE — Progress Notes (Signed)
Pt and caregiver in for follow up.  [facility].  Caregiver did not have a list of meds available today.  Pt weight is down 11 lbs since last seen in June 2022. Caregiver reports pt sometimes drinks an ensure during the day but not on a regular basis.

## 2021-03-12 LAB — CHROMOGRANIN A: Chromogranin A (ng/mL): 378.3 ng/mL — ABNORMAL HIGH (ref 0.0–101.8)

## 2021-03-13 ENCOUNTER — Other Ambulatory Visit (HOSPITAL_COMMUNITY): Payer: Self-pay

## 2021-03-13 NOTE — Progress Notes (Signed)
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 Mike Gip for polycythemia vera/ET and she was on Hydrea for the same.  Her last visit with her as an outpatient was in May 2020.  She was taking hydroxyurea 500 mg 5 times a week.  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  Tumor was 2.5 cm with 9+ lymph nodes pathologic stage was T3N1.  Her chromogranin levels since then have been monitored regularly and they have been chronically elevated between 200-2 50.  Patient was noted to have liver lesions but did not have any significant metabolic uptake on PET CT scan in November 2019.  At her last visit with Dr. Mike Gip in May 2020 plan was to get dotatate PET scan followed by consideration for liver biopsy and at that time patient was lost to follow-up    Dotatate scan in February 2021 showed multiple areas of Liver metastases with intense radiotracer activity consistent with well-differentiated metastatic neuroendocrine tumor.  Single small peritoneal implant along ventral peritoneal surface.  No other evidence of malignancy.  Patient was started on octreotide in March 2021   Patient is also on Hydrea 5 times a week for her  JAK2 positive ET.  She has had significant pancytopenia with higher doses of Hydrea in the past  Interval history-patient is here with her caregiver today.  She continues to be quite frail.  Has not had any recent falls.  Diarrhea is well controlled with Xermelo  ECOG PS- 3 Pain scale- 3 Opioid associated constipation- no  Review of systems- Review of Systems  Constitutional:  Positive for malaise/fatigue. Negative for chills, fever and weight loss.  HENT:  Negative for congestion, ear discharge and nosebleeds.   Eyes:  Negative for blurred vision.  Respiratory:  Negative for cough, hemoptysis, sputum production, shortness of breath and wheezing.   Cardiovascular:  Negative for chest pain, palpitations, orthopnea and claudication.  Gastrointestinal:  Negative for abdominal pain, blood in stool, constipation, diarrhea, heartburn, melena, nausea and vomiting.  Genitourinary:  Negative for dysuria, flank pain, frequency, hematuria and urgency.  Musculoskeletal:  Negative for back pain, joint pain and myalgias.  Skin:  Negative for rash.  Neurological:  Negative for dizziness, tingling, focal weakness, seizures, weakness and headaches.  Endo/Heme/Allergies:  Does not bruise/bleed easily.  Psychiatric/Behavioral:  Negative for depression and suicidal ideas. The patient does not have insomnia.       Allergies  Allergen Reactions   Other Other (See Comments)    ALL OPIOIDS    Simvastatin Other (See Comments)    Myalgia     Past Medical History:  Diagnosis Date   Adenocarcinoma in situ of cervix    Arthritis    hands   Breast cancer ([place])    Colon cancer ([place])    Diabetes mellitus  without complication ([place])    Endometrial carcinoma ([place])    s/p total abdominal hysterectomy   H/O compression fracture of spine 2014   thoracic spine   H/O polycythemia vera    H/O TIA (transient ischemic attack) and stroke 09/2014, 03/2015   No deficits   Hypertension    Hyperuricemia     Microalbuminuria    Myocardial infarction ([place])    Polycythemia vera ([place])    Recurrent falls    Skin cancer    face, legs   Stroke ([place]) 2008   no deficits   Trochanteric bursitis    Varicose veins    treated     Past Surgical History:  Procedure Laterality Date   ABDOMINAL HYSTERECTOMY     BOWEL RESECTION N/A 03/28/2015   Procedure: SMALL BOWEL RESECTION;  Surgeon: Leonie Green, MD;  Location: [facility];  Service: General;  Laterality: N/A;   CATARACT EXTRACTION W/ INTRAOCULAR LENS IMPLANT Right    CATARACT EXTRACTION W/PHACO Left 10/07/2015   Procedure: CATARACT EXTRACTION PHACO AND INTRAOCULAR LENS PLACEMENT ([place]);  Surgeon: Ronnell Freshwater, MD;  Location: [facility];  Service: Ophthalmology;  Laterality: Left;  DIABETIC - oral meds VISION BLUE   CORONARY ANGIOGRAPHY N/A 10/29/2017   Procedure: CORONARY ANGIOGRAPHY;  Surgeon: Dionisio David, MD;  Location: [place] [facility];  Service: Cardiovascular;  Laterality: N/A;   EXPLORATORY LAPAROTOMY     for fibroids   HIP ARTHROPLASTY Left 12/01/2018   Procedure: ARTHROPLASTY BIPOLAR HIP (HEMIARTHROPLASTY);  Surgeon: Corky Mull, MD;  Location: [facility];  Service: Orthopedics;  Laterality: Left;   LEFT HEART CATH Right 10/29/2017   Procedure: Left Heart Cath;  Surgeon: Dionisio David, MD;  Location: [place] [facility];  Service: Cardiovascular;  Laterality: Right;   TONSILLECTOMY      Social History   Socioeconomic History   Marital status: Widowed    Spouse name: Not on file   Number of children: Not on file   Years of education: Not on file   Highest education level: Not on file  Occupational History   Not on file  Tobacco Use   Smoking status: Former   Smokeless tobacco: Never  Vaping Use   Vaping Use: Never used  Substance and Sexual Activity   Alcohol use: No   Drug use: No   Sexual activity: Not Currently  Other Topics Concern   Not on file  Social History Narrative   Not on file    Social Determinants of Health   Financial Resource Strain: Not on file  Food Insecurity: Not on file  Transportation Needs: Not on file  Physical Activity: Not on file  Stress: Not on file  Social Connections: Not on file  Intimate Partner Violence: Not on file    Family History  Problem Relation Age of Onset   Cancer Brother        AML   Heart attack Mother    Breast cancer Mother    Heart attack Father    Diabetes Father    Heart attack Sister    Diabetes Sister      Current Outpatient Medications:    octreotide (SANDOSTATIN LAR) 10 MG injection, Inject 10 mg into the muscle every 28 (twenty-eight) days., Disp: , Rfl:    acetaminophen (TYLENOL) 325 MG tablet, Take 1-2 tablets (325-650 mg total) by mouth every 6 (six) hours as needed for mild pain (pain score 1-3 or temp > 100.5). (Patient taking differently: Take 650  mg by mouth 3 (three) times daily.), Disp:  , Rfl:    amLODipine (NORVASC) 5 MG tablet, Take 1 tablet (5 mg total) by mouth daily., Disp: 30 tablet, Rfl: 1   calcium-vitamin D (OSCAL WITH D) 500-200 MG-UNIT per tablet, Take 1 tablet by mouth daily. , Disp: , Rfl:    carvedilol (COREG) 25 MG tablet, Take 1 tablet (25 mg total) by mouth 2 (two) times daily with a meal. (Patient taking differently: Take 12.5 mg by mouth 2 (two) times daily with a meal.), Disp: 60 tablet, Rfl: 0   clopidogrel (PLAVIX) 75 MG tablet, TAKE 1 TABLET BY MOUTH DAILY, Disp: 30 tablet, Rfl: 0   Docusate Sodium (STOOL SOFTENER LAXATIVE PO), Take by mouth., Disp: , Rfl:    fexofenadine (ALLEGRA ALLERGY) 180 MG tablet, Take 1 tablet (180 mg total) by mouth daily., Disp: , Rfl:    fluticasone (FLONASE) 50 MCG/ACT nasal spray, Place 2 sprays into the nose daily., Disp: , Rfl:    furosemide (LASIX) 20 MG tablet, Take 20 mg by mouth daily., Disp: , Rfl:    hydroxyurea (HYDREA) 500 MG capsule, Take 500mg  on Monday, Wednesday,Friday and Sunday.Take 1000mg  on Tuesday, Thursday and Saturdays. May take  with food to minimize GI side effects., Disp: 40 capsule, Rfl: 1   insulin detemir (LEVEMIR) 100 unit/ml SOLN, Inject 0.08 mLs (8 Units total) into the skin at bedtime. (Patient taking differently: Inject 12 Units into the skin at bedtime.), Disp: 8 mL, Rfl: 1   pantoprazole (PROTONIX) 40 MG tablet, Take 1 tablet (40 mg total) by mouth daily., Disp: 30 tablet, Rfl: 0   pravastatin (PRAVACHOL) 20 MG tablet, Take 1 tablet (20 mg total) by mouth at bedtime., Disp: 30 tablet, Rfl: 0   QUEtiapine (SEROQUEL) 50 MG tablet, Take 50 mg by mouth at bedtime. , Disp: , Rfl:    ramipril (ALTACE) 5 MG capsule, Take 5 mg by mouth 2 (two) times daily., Disp: , Rfl:    sertraline (ZOLOFT) 50 MG tablet, Take 1 tablet (50 mg total) by mouth daily., Disp: 30 tablet, Rfl: 4   spironolactone (ALDACTONE) 25 MG tablet, Take 0.5 tablets (12.5 mg total) by mouth every other day for 30 days. (Patient taking differently: Take 12.5 mg by mouth See admin instructions. Take 12.5 mg by mouth twice daily every other day), Disp: 7.5 tablet, Rfl: 0   Telotristat Ethyl,as Etiprate, (XERMELO) 250 MG TABS, TAKE 1 TABLET (250 MG) BY MOUTH 3 (THREE) TIMES DAILY WITH MEALS., Disp: 84 tablet, Rfl: 0   traMADol (ULTRAM) 50 MG tablet, 25 mg every other day. , Disp: , Rfl:  No current facility-administered medications for this visit.  Facility-Administered Medications Ordered in Other Visits:    octreotide (SANDOSTATIN LAR) IM injection 30 mg, 30 mg, Intramuscular, Q30 days, Sindy Guadeloupe, MD, 30 mg at 10/30/19 1134   octreotide (SANDOSTATIN LAR) IM injection 30 mg, 30 mg, Intramuscular, Q30 days, Sindy Guadeloupe, MD, 30 mg at 12/29/19 1044   octreotide (SANDOSTATIN LAR) IM injection 30 mg, 30 mg, Intramuscular, Q30 days, Sindy Guadeloupe, MD, 30 mg at 02/02/20 1512   octreotide (SANDOSTATIN LAR) IM injection 30 mg, 30 mg, Intramuscular, Q30 days, Sindy Guadeloupe, MD, 30 mg at 05/06/20 1550   octreotide (SANDOSTATIN LAR) IM injection 30 mg, 30  mg, Intramuscular, Q30 days, Sindy Guadeloupe, MD, 30 mg at 08/06/20 1518   octreotide (SANDOSTATIN LAR) IM injection 30 mg, 30 mg, Intramuscular, Q30 days, Sindy Guadeloupe, MD,  30 mg at 09/05/20 1543   octreotide (SANDOSTATIN LAR) IM injection 30 mg, 30 mg, Intramuscular, Q30 days, Sindy Guadeloupe, MD, 30 mg at 12/06/20 1546   octreotide (SANDOSTATIN LAR) IM injection 30 mg, 30 mg, Intramuscular, Q30 days, Sindy Guadeloupe, MD, 30 mg at 03/11/21 1552  Physical exam:  Vitals:   03/11/21 1505  BP: 121/63  Pulse: (!) 55  Resp: 16  Temp: (!) 97.4 F (36.3 C)  TempSrc: Tympanic  SpO2: 94%  Weight: 85 lb (38.6 kg)   Physical Exam Constitutional:      Comments: Thin elderly cachectic female sitting in a wheelchair and appears in no acute distress.  She is frail-appearing  Cardiovascular:     Rate and Rhythm: Normal rate and regular rhythm.     Heart sounds: Normal heart sounds.  Pulmonary:     Effort: Pulmonary effort is normal.     Breath sounds: Normal breath sounds.  Abdominal:     General: Bowel sounds are normal.     Palpations: Abdomen is soft.  Skin:    General: Skin is warm and dry.     Comments: Scattered bruising noted over bilateral forearms  Neurological:     Mental Status: She is alert and oriented to person, place, and time.     CMP Latest Ref Rng & Units 03/11/2021  Glucose 70 - 99 mg/dL 274(H)  BUN 8 - 23 mg/dL 40(H)  Creatinine 0.44 - 1.00 mg/dL 1.33(H)  Sodium 135 - 145 mmol/L 136  Potassium 3.5 - 5.1 mmol/L 3.7  Chloride 98 - 111 mmol/L 99  CO2 22 - 32 mmol/L 31  Calcium 8.9 - 10.3 mg/dL 8.5(L)  Total Protein 6.5 - 8.1 g/dL 5.7(L)  Total Bilirubin 0.3 - 1.2 mg/dL 0.7  Alkaline Phos 38 - 126 U/L 68  AST 15 - 41 U/L 111(H)  ALT 0 - 44 U/L 149(H)   CBC Latest Ref Rng & Units 03/11/2021  WBC 4.0 - 10.5 K/uL 6.5  Hemoglobin 12.0 - 15.0 g/dL 10.1(L)  Hematocrit 36.0 - 46.0 % 30.5(L)  Platelets 150 - 400 K/uL 215     Assessment and plan- Patient is a 87 y.o.  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  She is not a candidate for everolimus or capecitabine.  Continue octreotide and Xermelo more for symptomatic control of her diarrhea.  Overall patient is quite frail and it is unclear how long she would be able to come for her monthly octreotide injections.  I have scheduled a repeat scan for her in December 2022 and I will see her thereafter.  Chromogranin A levels are presently stable Essential thrombocytosis high risk on Hydrea:We did increase the dose of her Hydrea to 500 mg 4 days a week and 1000 mg 3 days a week.  Her white count is normal now and so is her platelet count.  However she is slightly more anemic and her hemoglobin is down from 13.5-10.1.  We will repeat CBC with differential next month when she comes for her octreotide injection and see if we need to make any changes to her Hydrea dosing. Patient will continue monthly octreotide injections and I will see her in December after her scans   Visit Diagnosis 1. Carcinoid tumor of colon   2. Encounter for monitoring octreotide therapy      Dr. Randa Evens, MD, MPH [place] at [facility]  [facility] [phone] 03/13/2021 9:47 AM

## 2021-03-19 ENCOUNTER — Other Ambulatory Visit (HOSPITAL_COMMUNITY): Payer: Self-pay

## 2021-03-25 ENCOUNTER — Other Ambulatory Visit (HOSPITAL_COMMUNITY): Payer: Self-pay

## 2021-03-28 ENCOUNTER — Other Ambulatory Visit (HOSPITAL_COMMUNITY): Payer: Self-pay

## 2021-03-28 ENCOUNTER — Other Ambulatory Visit: Payer: Self-pay | Admitting: Oncology

## 2021-03-28 ENCOUNTER — Other Ambulatory Visit: Payer: Self-pay | Admitting: *Deleted

## 2021-03-28 MED ORDER — XERMELO 250 MG PO TABS
ORAL_TABLET | ORAL | 0 refills | Status: DC
  Filled 2021-03-28: qty 84, fill #0

## 2021-03-28 MED ORDER — XERMELO 250 MG PO TABS
ORAL_TABLET | ORAL | 0 refills | Status: AC
  Filled 2021-03-28: qty 84, 28d supply, fill #0

## 2021-03-31 ENCOUNTER — Observation Stay
Admission: EM | Admit: 2021-03-31 | Discharge: 2021-04-01 | Disposition: A | Discharge status: Home / Self Care | Payer: Medicare PPO | Attending: Emergency Medicine | Admitting: Emergency Medicine

## 2021-03-31 ENCOUNTER — Other Ambulatory Visit: Payer: Self-pay

## 2021-03-31 ENCOUNTER — Emergency Department: Payer: Medicare PPO

## 2021-03-31 DIAGNOSIS — Z8673 Personal history of transient ischemic attack (TIA), and cerebral infarction without residual deficits: Secondary | ICD-10-CM | POA: Diagnosis not present

## 2021-03-31 DIAGNOSIS — E1122 Type 2 diabetes mellitus with diabetic chronic kidney disease: Secondary | ICD-10-CM | POA: Diagnosis not present

## 2021-03-31 DIAGNOSIS — Z85038 Personal history of other malignant neoplasm of large intestine: Secondary | ICD-10-CM | POA: Insufficient documentation

## 2021-03-31 DIAGNOSIS — I130 Hypertensive heart and chronic kidney disease with heart failure and stage 1 through stage 4 chronic kidney disease, or unspecified chronic kidney disease: Secondary | ICD-10-CM | POA: Diagnosis not present

## 2021-03-31 DIAGNOSIS — I5021 Acute systolic (congestive) heart failure: Secondary | ICD-10-CM | POA: Insufficient documentation

## 2021-03-31 DIAGNOSIS — D631 Anemia in chronic kidney disease: Secondary | ICD-10-CM | POA: Diagnosis not present

## 2021-03-31 DIAGNOSIS — Z87891 Personal history of nicotine dependence: Secondary | ICD-10-CM | POA: Diagnosis not present

## 2021-03-31 DIAGNOSIS — Z794 Long term (current) use of insulin: Secondary | ICD-10-CM | POA: Insufficient documentation

## 2021-03-31 DIAGNOSIS — Z96642 Presence of left artificial hip joint: Secondary | ICD-10-CM | POA: Diagnosis not present

## 2021-03-31 DIAGNOSIS — Z853 Personal history of malignant neoplasm of breast: Secondary | ICD-10-CM | POA: Insufficient documentation

## 2021-03-31 DIAGNOSIS — N1832 Chronic kidney disease, stage 3b: Secondary | ICD-10-CM | POA: Insufficient documentation

## 2021-03-31 DIAGNOSIS — Z20822 Contact with and (suspected) exposure to covid-19: Secondary | ICD-10-CM | POA: Diagnosis not present

## 2021-03-31 DIAGNOSIS — Z85828 Personal history of other malignant neoplasm of skin: Secondary | ICD-10-CM | POA: Insufficient documentation

## 2021-03-31 DIAGNOSIS — Z7902 Long term (current) use of antithrombotics/antiplatelets: Secondary | ICD-10-CM | POA: Diagnosis not present

## 2021-03-31 DIAGNOSIS — Z79899 Other long term (current) drug therapy: Secondary | ICD-10-CM | POA: Diagnosis not present

## 2021-03-31 DIAGNOSIS — R55 Syncope and collapse: Secondary | ICD-10-CM | POA: Diagnosis present

## 2021-03-31 DIAGNOSIS — I2510 Atherosclerotic heart disease of native coronary artery without angina pectoris: Secondary | ICD-10-CM | POA: Insufficient documentation

## 2021-03-31 DIAGNOSIS — D649 Anemia, unspecified: Secondary | ICD-10-CM

## 2021-03-31 LAB — BASIC METABOLIC PANEL
Anion gap: 9 (ref 5–15)
BUN: 39 mg/dL — ABNORMAL HIGH (ref 8–23)
CO2: 29 mmol/L (ref 22–32)
Calcium: 8.4 mg/dL — ABNORMAL LOW (ref 8.9–10.3)
Chloride: 99 mmol/L (ref 98–111)
Creatinine, Ser: 1.25 mg/dL — ABNORMAL HIGH (ref 0.44–1.00)
GFR, Estimated: 42 mL/min — ABNORMAL LOW (ref >60)
Glucose, Bld: 216 mg/dL — ABNORMAL HIGH (ref 70–99)
Potassium: 3.6 mmol/L (ref 3.5–5.1)
Sodium: 137 mmol/L (ref 135–145)

## 2021-03-31 LAB — URINALYSIS, COMPLETE (UACMP) WITH MICROSCOPIC
Bilirubin Urine: NEGATIVE
Glucose, UA: NEGATIVE mg/dL
Hgb urine dipstick: NEGATIVE
Ketones, ur: NEGATIVE mg/dL
Leukocytes,Ua: NEGATIVE
Nitrite: NEGATIVE
Protein, ur: 30 mg/dL — AB
Specific Gravity, Urine: 1.011 (ref 1.005–1.030)
pH: 5 (ref 5.0–8.0)

## 2021-03-31 LAB — CBC
HCT: 22.3 % — ABNORMAL LOW (ref 36.0–46.0)
Hemoglobin: 7.7 g/dL — ABNORMAL LOW (ref 12.0–15.0)
MCH: 43.8 pg — ABNORMAL HIGH (ref 26.0–34.0)
MCHC: 34.5 g/dL (ref 30.0–36.0)
MCV: 126.7 fL — ABNORMAL HIGH (ref 80.0–100.0)
Platelets: 165 10*3/uL (ref 150–400)
RBC: 1.76 MIL/uL — ABNORMAL LOW (ref 3.87–5.11)
RDW: 19.6 % — ABNORMAL HIGH (ref 11.5–15.5)
WBC: 2.4 10*3/uL — ABNORMAL LOW (ref 4.0–10.5)
nRBC: 0 % (ref 0.0–0.2)

## 2021-03-31 NOTE — ED Notes (Signed)
Pt brought in from home via EMs with c/o fall. Per caretaker pt was assisted to floor. VSS per EMs pt has hx of stroke in past.  Pt had to understand due to stroke per EMS

## 2021-03-31 NOTE — ED Notes (Signed)
Pt assisted to BR to void at request; pt able to stand and ambulate few steps to commode with stand-by assist only; assisted back into recliner and positioned for comfort

## 2021-03-31 NOTE — ED Provider Notes (Signed)
Emergency Medicine Provider Triage Evaluation Note  [name] , a 87 y.o. female  was evaluated in triage.  Pt complains of soft fall. Patient arrives via EMS with caregiver reporting that the patient was assisted to the floor. Patient has dysarthria due to previous stroke.  Review of Systems  Positive: Fall Negative: CP, SOB  Physical Exam  BP (!) 152/70 (BP Location: Right Arm)   Pulse 64   Temp 97.6 F (36.4 C) (Oral)   Resp 18   SpO2 96%  Gen:   Awake, no distress  NAD Resp:  Normal effort CTA MSK:   Moves extremities without difficulty  Other:  ABD: soft, nontender  Medical Decision Making  Medically screening exam initiated at 7:35 PM.  Appropriate orders placed.  [name] was informed that the remainder of the evaluation will be completed by another provider, this initial triage assessment does not replace that evaluation, and the importance of remaining in the ED until their evaluation is complete.  Geriatric patient with ED evaluation of a reported soft fall per caregiver.    April Needles, PA-C 03/31/21 1938    Naaman Plummer, MD 04/01/21 2104

## 2021-03-31 NOTE — ED Notes (Signed)
Pt not answering questions in triage but moaning with bloodraw. First nurse made aware.

## 2021-04-01 ENCOUNTER — Encounter: Payer: Self-pay | Admitting: Family Medicine

## 2021-04-01 ENCOUNTER — Observation Stay
Admit: 2021-04-01 | Discharge: 2021-04-01 | Disposition: A | Discharge status: Home / Self Care | Payer: Medicare PPO | Attending: Internal Medicine | Admitting: Internal Medicine

## 2021-04-01 ENCOUNTER — Other Ambulatory Visit (HOSPITAL_COMMUNITY): Payer: Self-pay

## 2021-04-01 DIAGNOSIS — N189 Chronic kidney disease, unspecified: Secondary | ICD-10-CM

## 2021-04-01 DIAGNOSIS — C7B02 Secondary carcinoid tumors of liver: Secondary | ICD-10-CM

## 2021-04-01 DIAGNOSIS — R55 Syncope and collapse: Secondary | ICD-10-CM | POA: Diagnosis not present

## 2021-04-01 DIAGNOSIS — N179 Acute kidney failure, unspecified: Secondary | ICD-10-CM

## 2021-04-01 DIAGNOSIS — I10 Essential (primary) hypertension: Secondary | ICD-10-CM

## 2021-04-01 DIAGNOSIS — C7A012 Malignant carcinoid tumor of the ileum: Secondary | ICD-10-CM | POA: Diagnosis not present

## 2021-04-01 DIAGNOSIS — D649 Anemia, unspecified: Secondary | ICD-10-CM | POA: Diagnosis not present

## 2021-04-01 LAB — ECHOCARDIOGRAM COMPLETE
AR max vel: 2.16 cm2
AV Area VTI: 2.46 cm2
AV Area mean vel: 2.44 cm2
AV Mean grad: 2 mmHg
AV Peak grad: 4.7 mmHg
Ao pk vel: 1.08 m/s
Area-P 1/2: 3.93 cm2
Height: 60 in
S' Lateral: 1.7 cm
Weight: 1322.76 oz

## 2021-04-01 LAB — CBC
HCT: 28.6 % — ABNORMAL LOW (ref 36.0–46.0)
Hemoglobin: 10.3 g/dL — ABNORMAL LOW (ref 12.0–15.0)
MCH: 41.4 pg — ABNORMAL HIGH (ref 26.0–34.0)
MCHC: 36 g/dL (ref 30.0–36.0)
MCV: 114.9 fL — ABNORMAL HIGH (ref 80.0–100.0)
Platelets: 173 10*3/uL (ref 150–400)
RBC: 2.49 MIL/uL — ABNORMAL LOW (ref 3.87–5.11)
RDW: 23 % — ABNORMAL HIGH (ref 11.5–15.5)
WBC: 3 10*3/uL — ABNORMAL LOW (ref 4.0–10.5)
nRBC: 0 % (ref 0.0–0.2)

## 2021-04-01 LAB — PROTIME-INR
INR: 1.3 — ABNORMAL HIGH (ref 0.8–1.2)
Prothrombin Time: 16.6 seconds — ABNORMAL HIGH (ref 11.4–15.2)

## 2021-04-01 LAB — RESP PANEL BY RT-PCR (FLU A&B, COVID) ARPGX2
Influenza A by PCR: NEGATIVE
Influenza B by PCR: NEGATIVE
SARS Coronavirus 2 by RT PCR: NEGATIVE

## 2021-04-01 LAB — BASIC METABOLIC PANEL
Anion gap: 10 (ref 5–15)
BUN: 35 mg/dL — ABNORMAL HIGH (ref 8–23)
CO2: 29 mmol/L (ref 22–32)
Calcium: 8.5 mg/dL — ABNORMAL LOW (ref 8.9–10.3)
Chloride: 98 mmol/L (ref 98–111)
Creatinine, Ser: 1.19 mg/dL — ABNORMAL HIGH (ref 0.44–1.00)
GFR, Estimated: 44 mL/min — ABNORMAL LOW (ref >60)
Glucose, Bld: 217 mg/dL — ABNORMAL HIGH (ref 70–99)
Potassium: 3.7 mmol/L (ref 3.5–5.1)
Sodium: 137 mmol/L (ref 135–145)

## 2021-04-01 LAB — GLUCOSE, CAPILLARY
Glucose-Capillary: 230 mg/dL — ABNORMAL HIGH (ref 70–99)
Glucose-Capillary: 190 mg/dL — ABNORMAL HIGH (ref 70–99)

## 2021-04-01 LAB — HEMOGLOBIN A1C
Hgb A1c MFr Bld: 6.9 % — ABNORMAL HIGH (ref 4.8–5.6)
Mean Plasma Glucose: 151 mg/dL

## 2021-04-01 LAB — TROPONIN I (HIGH SENSITIVITY)
Troponin I (High Sensitivity): 11 ng/L (ref <18)
Troponin I (High Sensitivity): 10 ng/L (ref <18)

## 2021-04-01 LAB — APTT: aPTT: 34 seconds (ref 24–36)

## 2021-04-01 LAB — CBG MONITORING, ED: Glucose-Capillary: 231 mg/dL — ABNORMAL HIGH (ref 70–99)

## 2021-04-01 MED ORDER — ONDANSETRON HCL 4 MG PO TABS: 4.0000 mg | ORAL_TABLET | Freq: Four times a day (QID) | ORAL | Status: DC | PRN

## 2021-04-01 MED ORDER — NALOXONE HCL 0.4 MG/ML IJ SOLN: 0.4000 mg | INTRAMUSCULAR | Status: DC | PRN

## 2021-04-01 MED ORDER — ENOXAPARIN SODIUM 30 MG/0.3ML IJ SOSY
30.0000 mg | PREFILLED_SYRINGE | INTRAMUSCULAR | Status: DC
  Administered 2021-04-01: 30 mg via SUBCUTANEOUS
  Filled 2021-04-01: qty 0.3

## 2021-04-01 MED ORDER — FLUTICASONE PROPIONATE 50 MCG/ACT NA SUSP
2.0000 | Freq: Every day | NASAL | Status: DC
  Administered 2021-04-01: 09:00:00 2 via NASAL
  Filled 2021-04-01: qty 16

## 2021-04-01 MED ORDER — SERTRALINE HCL 50 MG PO TABS
50.0000 mg | ORAL_TABLET | Freq: Every day | ORAL | Status: DC
  Administered 2021-04-01: 10:00:00 50 mg via ORAL
  Filled 2021-04-01: qty 1

## 2021-04-01 MED ORDER — INSULIN DETEMIR 100 UNIT/ML ~~LOC~~ SOLN: 6.0000 [IU] | Freq: Every day | SUBCUTANEOUS | 11 refills | Status: AC

## 2021-04-01 MED ORDER — INSULIN DETEMIR 100 UNIT/ML ~~LOC~~ SOLN
12.0000 [IU] | Freq: Every day | SUBCUTANEOUS | Status: DC
  Filled 2021-04-01: qty 0.12

## 2021-04-01 MED ORDER — TRAMADOL HCL 50 MG PO TABS: 25.0000 mg | ORAL_TABLET | Freq: Four times a day (QID) | ORAL | Status: DC | PRN

## 2021-04-01 MED ORDER — INSULIN DETEMIR 100 UNIT/ML ~~LOC~~ SOLN: 6.0000 [IU] | Freq: Every day | SUBCUTANEOUS | Status: DC

## 2021-04-01 MED ORDER — ACETAMINOPHEN 650 MG RE SUPP: 650.0000 mg | Freq: Four times a day (QID) | RECTAL | Status: DC | PRN

## 2021-04-01 MED ORDER — FUROSEMIDE 20 MG PO TABS: 20.0000 mg | ORAL_TABLET | Freq: Every day | ORAL | Status: DC

## 2021-04-01 MED ORDER — LORATADINE 10 MG PO TABS
10.0000 mg | ORAL_TABLET | Freq: Every day | ORAL | Status: DC
  Administered 2021-04-01: 09:00:00 10 mg via ORAL
  Filled 2021-04-01: qty 1

## 2021-04-01 MED ORDER — SPIRONOLACTONE 25 MG PO TABS: 12.5000 mg | ORAL_TABLET | ORAL | Status: DC

## 2021-04-01 MED ORDER — MAGNESIUM HYDROXIDE 400 MG/5ML PO SUSP
30.0000 mL | Freq: Every day | ORAL | Status: DC | PRN
  Filled 2021-04-01: qty 30

## 2021-04-01 MED ORDER — PRAVASTATIN SODIUM 20 MG PO TABS: 20.0000 mg | ORAL_TABLET | Freq: Every day | ORAL | Status: DC

## 2021-04-01 MED ORDER — SODIUM CHLORIDE 0.9 % IV SOLN: INTRAVENOUS | Status: DC

## 2021-04-01 MED ORDER — OCTREOTIDE ACETATE 10 MG IM KIT
10.0000 mg | PACK | INTRAMUSCULAR | Status: DC
  Filled 2021-04-01: qty 1

## 2021-04-01 MED ORDER — SPIRONOLACTONE 25 MG PO TABS
12.5000 mg | ORAL_TABLET | ORAL | Status: DC
  Filled 2021-04-01: qty 0.5

## 2021-04-01 MED ORDER — CLOPIDOGREL BISULFATE 75 MG PO TABS
75.0000 mg | ORAL_TABLET | Freq: Every day | ORAL | Status: DC
  Administered 2021-04-01: 75 mg via ORAL
  Filled 2021-04-01: qty 1

## 2021-04-01 MED ORDER — QUETIAPINE FUMARATE 25 MG PO TABS: 50.0000 mg | ORAL_TABLET | Freq: Every day | ORAL | Status: DC

## 2021-04-01 MED ORDER — TRAZODONE HCL 50 MG PO TABS: 25.0000 mg | ORAL_TABLET | Freq: Every evening | ORAL | Status: DC | PRN

## 2021-04-01 MED ORDER — CARVEDILOL 6.25 MG PO TABS
12.5000 mg | ORAL_TABLET | Freq: Two times a day (BID) | ORAL | Status: DC
  Administered 2021-04-01 (×2): 12.5 mg via ORAL
  Filled 2021-04-01 (×2): qty 2

## 2021-04-01 MED ORDER — SENNOSIDES-DOCUSATE SODIUM 8.6-50 MG PO TABS: 1.0000 | ORAL_TABLET | Freq: Every evening | ORAL | Status: DC | PRN

## 2021-04-01 MED ORDER — CALCIUM CARBONATE-VITAMIN D 500-200 MG-UNIT PO TABS
1.0000 | ORAL_TABLET | Freq: Every day | ORAL | Status: DC
  Administered 2021-04-01: 1 via ORAL
  Filled 2021-04-01: qty 1

## 2021-04-01 MED ORDER — SODIUM CHLORIDE 0.9 % IV SOLN
10.0000 mL/h | Freq: Once | INTRAVENOUS | Status: AC
  Administered 2021-04-01: 10 mL/h via INTRAVENOUS

## 2021-04-01 MED ORDER — INSULIN ASPART 100 UNIT/ML IJ SOLN: 0.0000 [IU] | Freq: Every day | INTRAMUSCULAR | Status: DC

## 2021-04-01 MED ORDER — ONDANSETRON HCL 4 MG/2ML IJ SOLN: 4.0000 mg | Freq: Four times a day (QID) | INTRAMUSCULAR | Status: DC | PRN

## 2021-04-01 MED ORDER — PANTOPRAZOLE SODIUM 40 MG PO TBEC
40.0000 mg | DELAYED_RELEASE_TABLET | Freq: Every day | ORAL | Status: DC
  Administered 2021-04-01: 09:00:00 40 mg via ORAL
  Filled 2021-04-01: qty 1

## 2021-04-01 MED ORDER — ACETAMINOPHEN 325 MG PO TABS: 650.0000 mg | ORAL_TABLET | Freq: Four times a day (QID) | ORAL | Status: DC | PRN

## 2021-04-01 MED ORDER — HYDROXYUREA 500 MG PO CAPS: ORAL_CAPSULE | ORAL | 1 refills | Status: AC

## 2021-04-01 MED ORDER — AMLODIPINE BESYLATE 5 MG PO TABS
5.0000 mg | ORAL_TABLET | Freq: Every day | ORAL | Status: DC
  Administered 2021-04-01: 09:00:00 5 mg via ORAL
  Filled 2021-04-01: qty 1

## 2021-04-01 MED ORDER — INSULIN ASPART 100 UNIT/ML IJ SOLN
0.0000 [IU] | Freq: Three times a day (TID) | INTRAMUSCULAR | Status: DC
  Administered 2021-04-01: 17:00:00 2 [IU] via SUBCUTANEOUS
  Administered 2021-04-01: 3 [IU] via SUBCUTANEOUS
  Filled 2021-04-01 (×2): qty 1

## 2021-04-01 MED ORDER — RAMIPRIL 5 MG PO CAPS
5.0000 mg | ORAL_CAPSULE | Freq: Two times a day (BID) | ORAL | Status: DC
  Filled 2021-04-01: qty 1

## 2021-04-01 NOTE — Consult Note (Signed)
Hematology/Oncology Consult note [facility] [phone] [phone]  Patient Care Team: Baxter Hire, MD as PCP - General (Internal Medicine) Yolonda Kida, MD as Consulting Physician (Cardiology) Sindy Guadeloupe, MD as Consulting Physician (Hematology and Oncology)   Name of the patient: [name]  [id number]  [birth year]    Reason for consult: Anemia   Requesting physician: Dr. 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  She is also on as needed Xermelo for her diarrhea.  Patient also has a history of essential thrombocytosis for which she was on Hydrea as an outpatient and recently her dose of Hydrea was increased from 5 days a week to 6 days a week.  She presentedTo the ER with symptoms of generalized weakness more than her baseline and there is possible concern for seizure.  At baseline patient is quite frail and cachectic.  She has 24-hour caregivers at home.  Patient's white cell count was 29 in July 2022 with a platelet count of 587.  After Hydrea dose was increased her white count went down to 2.4 with a hemoglobin of 7.7 and a platelet count of 165.  ECOG PS- 3  Pain scale- 0   Review of systems- Review of Systems  Constitutional:  Positive for malaise/fatigue.   Allergies  Allergen Reactions   Other Other (See Comments)    ALL OPIOIDS    Simvastatin Other (See Comments)    Myalgia    Patient Active Problem List   Diagnosis Date Noted   Near syncope 04/01/2021   Leg pain 04/05/2020   PAD (peripheral artery disease) ([place]) 04/05/2020   Palliative care encounter    B12 deficiency    Pancytopenia ([place]) 07/18/2019   Status post hip hemiarthroplasty 12/02/2018   Closed left hip fracture ([place]) 11/30/2018   CRF (chronic renal failure),  stage 2 (mild) 10/28/2018   Liver lesion 04/22/2018   Goals of care, counseling/discussion 04/22/2018   Cognitive deficit, post-stroke 12/31/2017   Ataxia, post-stroke 12/31/2017   Intracranial hemorrhage ([place]) 12/03/2017   Dysarthria, post-stroke    Gait disturbance, post-stroke    H/O cerebral parenchymal hemorrhage 11/04/2017   Essential hypertension 11/04/2017   Hyperlipidemia 11/04/2017   Diabetes ([place]) 11/04/2017   CAD (coronary artery disease) 11/04/2017   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   Hypokalemia 11/04/2017   Right-sided nontraumatic intracerebral hemorrhage of cerebellum ([place])    History of cervical cancer    History of TIA (transient ischemic attack)    Acute systolic congestive heart failure (HCC)    Reactive hypertension    Hypernatremia    Leukocytosis    Acute blood loss anemia    Elevated serum creatinine    Acute respiratory failure with hypoxia (HCC)    IVH (intraventricular hemorrhage) ([place]) 10/29/2017   Hypoxia 10/28/2017   Depression, prolonged 09/02/2017   Pancreatic lesion 05/24/2017   Osteoarthritis of both knees 08/31/2016   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   Nodule of upper lobe of left lung 06/04/2015   Dizziness 04/15/2015   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 [facility]) 04/11/2015   Cerebral thrombosis with cerebral infarction 04/03/2015   Cancer of right colon ([place]) 03/28/2015   CVA (cerebral infarction) 12/21/2014   Microalbuminuria 09/24/2014   Hyperuricemia 06/13/2014   Polycythemia 06/13/2014   Diabetes mellitus type 2, uncomplicated ([place]) 93/23/5573  Trochanteric bursitis 12/09/2013   Basal cell carcinoma of skin of lip 05/11/2013   Polycythemia vera ([place]) 06/22/2006     Past Medical History:  Diagnosis Date   Adenocarcinoma in situ of cervix    Arthritis    hands   Breast cancer ([place])    Colon cancer ([place])    Diabetes mellitus without complication ([place])    Endometrial carcinoma ([place])    s/p total abdominal  hysterectomy   H/O compression fracture of spine 2014   thoracic spine   H/O polycythemia vera    H/O TIA (transient ischemic attack) and stroke 09/2014, 03/2015   No deficits   Hypertension    Hyperuricemia    Microalbuminuria    Myocardial infarction ([place])    Polycythemia vera ([place])    Recurrent falls    Skin cancer    face, legs   Stroke ([place]) 2008   no deficits   Trochanteric bursitis    Varicose veins    treated     Past Surgical History:  Procedure Laterality Date   ABDOMINAL HYSTERECTOMY     BOWEL RESECTION N/A 03/28/2015   Procedure: SMALL BOWEL RESECTION;  Surgeon: Leonie Green, MD;  Location: [facility];  Service: General;  Laterality: N/A;   CATARACT EXTRACTION W/ INTRAOCULAR LENS IMPLANT Right    CATARACT EXTRACTION W/PHACO Left 10/07/2015   Procedure: CATARACT EXTRACTION PHACO AND INTRAOCULAR LENS PLACEMENT ([place]);  Surgeon: Ronnell Freshwater, MD;  Location: [place];  Service: Ophthalmology;  Laterality: Left;  DIABETIC - oral meds VISION BLUE   CORONARY ANGIOGRAPHY N/A 10/29/2017   Procedure: CORONARY ANGIOGRAPHY;  Surgeon: Dionisio David, MD;  Location: [place];  Service: Cardiovascular;  Laterality: N/A;   EXPLORATORY LAPAROTOMY     for fibroids   HIP ARTHROPLASTY Left 12/01/2018   Procedure: ARTHROPLASTY BIPOLAR HIP (HEMIARTHROPLASTY);  Surgeon: Corky Mull, MD;  Location: [facility];  Service: Orthopedics;  Laterality: Left;   LEFT HEART CATH Right 10/29/2017   Procedure: Left Heart Cath;  Surgeon: Dionisio David, MD;  Location: [place] [facility];  Service: Cardiovascular;  Laterality: Right;   TONSILLECTOMY      Social History   Socioeconomic History   Marital status: Widowed    Spouse name: Not on file   Number of children: Not on file   Years of education: Not on file   Highest education level: Not on file  Occupational History   Not on file  Tobacco Use   Smoking status: Former   Smokeless tobacco: Never   Vaping Use   Vaping Use: Never used  Substance and Sexual Activity   Alcohol use: No   Drug use: No   Sexual activity: Not Currently  Other Topics Concern   Not on file  Social History Narrative   Not on file   Social Determinants of Health   Financial Resource Strain: Not on file  Food Insecurity: Not on file  Transportation Needs: Not on file  Physical Activity: Not on file  Stress: Not on file  Social Connections: Not on file  Intimate Partner Violence: Not on file     Family History  Problem Relation Age of Onset   Cancer Brother        AML   Heart attack Mother    Breast cancer Mother    Heart attack Father    Diabetes Father    Heart attack Sister    Diabetes Sister      Current  Facility-Administered Medications:    0.9 %  sodium chloride infusion, , Intravenous, Continuous, Raiford Noble [place], [place], Last Rate: 75 mL/hr at 04/01/21 0959, Infusion Verify at 04/01/21 6767   acetaminophen (TYLENOL) tablet 650 mg, 650 mg, Oral, Q6H PRN **OR** acetaminophen (TYLENOL) suppository 650 mg, 650 mg, Rectal, Q6H PRN, Mansy, Jan A, MD   amLODipine (NORVASC) tablet 5 mg, 5 mg, Oral, Daily, Mansy, Jan A, MD, 5 mg at 04/01/21 0920   calcium-vitamin D (OSCAL WITH D) 500-200 MG-UNIT per tablet 1 tablet, 1 tablet, Oral, Daily, Mansy, Jan A, MD, 1 tablet at 04/01/21 0920   carvedilol (COREG) tablet 12.5 mg, 12.5 mg, Oral, BID WC, Mansy, Jan A, MD, 12.5 mg at 04/01/21 0920   clopidogrel (PLAVIX) tablet 75 mg, 75 mg, Oral, Daily, Mansy, Jan A, MD, 75 mg at 04/01/21 0920   enoxaparin (LOVENOX) injection 30 mg, 30 mg, Subcutaneous, Q24H, Mansy, Jan A, MD, 30 mg at 04/01/21 0921   fluticasone (FLONASE) 50 MCG/ACT nasal spray 2 spray, 2 spray, Each Nare, Daily, Mansy, Jan A, MD, 2 spray at 04/01/21 0921   insulin aspart (novoLOG) injection 0-5 Units, 0-5 Units, Subcutaneous, QHS, Sheikh, Omair Latif, DO   insulin aspart (novoLOG) injection 0-9 Units, 0-9 Units, Subcutaneous, TID WC,  Sheikh, Omair Wilton, DO, 3 Units at 04/01/21 1246   [START ON 04/02/2021] insulin detemir (LEVEMIR) injection 6 Units, 6 Units, Subcutaneous, QHS, Sheikh, Omair Latif, DO   loratadine (CLARITIN) tablet 10 mg, 10 mg, Oral, Daily, Mansy, Jan A, MD, 10 mg at 04/01/21 0920   magnesium hydroxide (MILK OF MAGNESIA) suspension 30 mL, 30 mL, Oral, Daily PRN, Mansy, Jan A, MD   [START ON 04/10/2021] octreotide (SANDOSTATIN LAR) IM injection 10 mg, 10 mg, Intramuscular, Q28 days, Mansy, Jan A, MD   ondansetron (ZOFRAN) tablet 4 mg, 4 mg, Oral, Q6H PRN **OR** ondansetron (ZOFRAN) injection 4 mg, 4 mg, Intravenous, Q6H PRN, Mansy, Jan A, MD   pantoprazole (PROTONIX) EC tablet 40 mg, 40 mg, Oral, Daily, Mansy, Jan A, MD, 40 mg at 04/01/21 0920   pravastatin (PRAVACHOL) tablet 20 mg, 20 mg, Oral, QHS, Mansy, Jan A, MD   QUEtiapine (SEROQUEL) tablet 50 mg, 50 mg, Oral, QHS, Mansy, Jan A, MD   senna-docusate (Senokot-S) tablet 1 tablet, 1 tablet, Oral, QHS PRN, Mansy, Jan A, MD   sertraline (ZOLOFT) tablet 50 mg, 50 mg, Oral, Daily, Mansy, Jan A, MD, 50 mg at 04/01/21 0934   traMADol (ULTRAM) tablet 25 mg, 25 mg, Oral, Q6H PRN, Mansy, Jan A, MD   traZODone (DESYREL) tablet 25 mg, 25 mg, Oral, QHS PRN, Mansy, Jan A, MD  Facility-Administered Medications Ordered in Other Encounters:    octreotide (SANDOSTATIN LAR) IM injection 30 mg, 30 mg, Intramuscular, Q30 days, Sindy Guadeloupe, MD, 30 mg at 10/30/19 1134   octreotide (SANDOSTATIN LAR) IM injection 30 mg, 30 mg, Intramuscular, Q30 days, Sindy Guadeloupe, MD, 30 mg at 12/29/19 1044   octreotide (SANDOSTATIN LAR) IM injection 30 mg, 30 mg, Intramuscular, Q30 days, Sindy Guadeloupe, MD, 30 mg at 02/02/20 1512   octreotide (SANDOSTATIN LAR) IM injection 30 mg, 30 mg, Intramuscular, Q30 days, Sindy Guadeloupe, MD, 30 mg at 05/06/20 1550   octreotide (SANDOSTATIN LAR) IM injection 30 mg, 30 mg, Intramuscular, Q30 days, Sindy Guadeloupe, MD, 30 mg at 08/06/20 1518   octreotide  (SANDOSTATIN LAR) IM injection 30 mg, 30 mg, Intramuscular, Q30 days, Sindy Guadeloupe, MD, 30 mg at 09/05/20 1543  octreotide (SANDOSTATIN LAR) IM injection 30 mg, 30 mg, Intramuscular, Q30 days, Sindy Guadeloupe, MD, 30 mg at 12/06/20 1546   octreotide (SANDOSTATIN LAR) IM injection 30 mg, 30 mg, Intramuscular, Q30 days, Sindy Guadeloupe, MD, 30 mg at 03/11/21 1552   Physical exam:  Vitals:   04/01/21 0756 04/01/21 1128 04/01/21 1200 04/01/21 1229  BP: (!) 179/102 (!) 109/59    Pulse: 69 71    Resp: 10 20    Temp: (!) 97.5 F (36.4 C) 98.1 F (36.7 C)    TempSrc: Oral Oral    SpO2: 99% 97% 98% 100%  Weight:      Height:       Physical Exam Constitutional:      Comments: Patient is elderly frail and cachectic and appears to be at her baseline.  Cardiovascular:     Rate and Rhythm: Normal rate and regular rhythm.     Heart sounds: Normal heart sounds.  Pulmonary:     Effort: Pulmonary effort is normal.     Breath sounds: Normal breath sounds.  Abdominal:     General: Bowel sounds are normal.     Palpations: Abdomen is soft.  Skin:    General: Skin is warm and dry.  Neurological:     Mental Status: She is alert and oriented to person, place, and time.       CMP Latest Ref Rng & Units 04/01/2021  Glucose 70 - 99 mg/dL 217(H)  BUN 8 - 23 mg/dL 35(H)  Creatinine 0.44 - 1.00 mg/dL 1.19(H)  Sodium 135 - 145 mmol/L 137  Potassium 3.5 - 5.1 mmol/L 3.7  Chloride 98 - 111 mmol/L 98  CO2 22 - 32 mmol/L 29  Calcium 8.9 - 10.3 mg/dL 8.5(L)  Total Protein 6.5 - 8.1 g/dL -  Total Bilirubin 0.3 - 1.2 mg/dL -  Alkaline Phos 38 - 126 U/L -  AST 15 - 41 U/L -  ALT 0 - 44 U/L -   CBC Latest Ref Rng & Units 04/01/2021  WBC 4.0 - 10.5 K/uL 3.0(L)  Hemoglobin 12.0 - 15.0 g/dL 10.3(L)  Hematocrit 36.0 - 46.0 % 28.6(L)  Platelets 150 - 400 K/uL 173    @IMAGES @  CT HEAD WO CONTRAST (5MM)  Result Date: 03/31/2021 CLINICAL DATA:  Head trauma EXAM: CT HEAD WITHOUT CONTRAST TECHNIQUE:  Contiguous axial images were obtained from the base of the skull through the vertex without intravenous contrast. COMPARISON:  Brain MRI 11/01/2017, CT head 07/18/2019 FINDINGS: Brain: There is no evidence of acute intracranial hemorrhage, extra-axial fluid collection, or acute infarct. Mild encephalomalacia in the right cerebellar hemisphere is consistent with the prior intraparenchymal hematoma in this location. There is mild parenchymal volume loss with commensurate enlargement of the ventricular system. Foci of hypodensity in the subcortical and periventricular white matter likely reflects sequela of advanced chronic white matter microangiopathy, unchanged. There is no mass lesion.  There is no midline shift. Vascular: There is calcification of the bilateral cavernous ICAs. Skull: Normal. Negative for fracture or focal lesion. Sinuses/Orbits: There is mild mucosal thickening in the ethmoid air cells. Bilateral lens implants are in place. The globes and orbits are otherwise unremarkable. Other: None. IMPRESSION: 1. No acute intracranial hemorrhage or calvarial fracture. 2. Unchanged global parenchymal volume loss and chronic white matter microangiopathy. Electronically Signed   By: Valetta Mole M.D.   On: 03/31/2021 16:31    Assessment and plan- Patient is a 87 y.o. female admitted for generalized weakness found to have  worsening anemia  Anemia: Possibly secondary to increase in Hydrea dose especially given that her leukopenia was also worse.  She did receive a unit of blood transfusion and her hemoglobin is improved to 10.  No clinical history of overt GI bleeding.  I did speak to the patient's niece as well and plans to hold Hydrea until her blood counts get checked on 04/10/2021  Macrocytosis secondary to Hydrea  Metastatic carcinoid: We will plan on giving octreotide as an outpatient on 04/10/2021.  From hematology standpoint patient can be discharged today and she will be coming for blood work next  week to the cancer center      Visit Diagnosis 1. Anemia, unspecified type   2. Near syncope     Dr. Randa Evens, MD, MPH [facility] [phone] 04/01/2021

## 2021-04-01 NOTE — Progress Notes (Signed)
Care started prior to midnight in the emergency room and patient was admitted early this morning after midnight by Dr. Eugenie Norrie and I am in current agreement with this assessment and plan.  Additional changes to plan of care been made accordingly.  The patient is an 87 year old extremely thin and frail cachectic female with a past medical history significant for but not admitted to hypertension, diabetes mellitus type 2, polycythemia vera as well as other comorbidities who presented to ED with generalized onset weakness and near syncope.  Her niece mentioned that she had been progressively weak over the last few days and she denies any cough or wheezing or dyspnea.  When she came to the ED her blood pressure was 150/69 with other normal vital signs.  COVID-19 PCR came back negative and stool Hemoccult on rectal exam was negative.  EKG showed normal sinus rhythm with a rate of 62 and a first-degree AV block.  Her hemoglobin on admission was 7.7 and hematocrit was 22.3 down from 10.1/3.5 back a few weeks ago.  She is typed and screened and transfused 1 unit PRBCs and was admitted as an observation medical monitored bed for further evaluation management for her acute generalized weakness and near syncope. She is being treated for the following but not limited too:  Symptomatic anemia with presyncope and history of polycythemia vera. - The patient will be admitted to observation medically monitored bed. - She was typed and crossmatched and will be transfused 1 unit of packed red blood cells. - We will follow posttransfusion H&H and it came back with 10.3/28.6 with an MCV of 114.9 - The patient's stool Hemoccult came back negative. - We will follow her orthostatics. Repeat this AM and q12h - She will be hydrated with IV normal saline and rate reduced to 75 mL/hr -Hematology consult will be obtained. - Dr. Sidney Ace has notified Dr. Tasia Catchings about the patient -She will need PT and OT to further evaluate and treat for  safe discharge disposition -We will check troponins and cardiac echogram given her near syncope  Essential Hypertension. - We will continue her Norvasc and hold off ramipril given mild acute kidney injury. -Will add Hydralzaine 10 mg PRN for SBP >160 or DBP>100 -Continue to monitor blood pressures per protocol -Last blood pressure reading was elevated to 179/102  Acute kidney injury. -Mild - We will hold off nephrotoxins including Spironolactone, Lasix and Rampipril and hydrate with IV normal saline following BMP as well as avoid contrast dyes, hypotension and will need to renally adjust medications -Patient's BUNs/creatinine went from 40/1.33 -> 39/1.25 -> 35/1.19 -Continue monitor and repeat CMP in the AM   GERD/GI Prophylaxis  - We will continue PPI therapy with Pantoprazole 40 mg po Daily.  Diabetes Mellitus Type 2 -C/w Levemir 12 units sq Daily and add Sensitive Novolog SSI AC -Continue to Monitor CBGs per protocol   HLD -C/w Pravastatin 20 mg po qHS  Depression and Anxiety  - We will continue Sertaline and Quetiapine .   We will continue to monitor the patient's clinical response to intervention and follow-up on therapy recommendations and check echocardiogram

## 2021-04-01 NOTE — H&P (Addendum)
[place]   PATIENT NAME: [name]    MR#:  [id number]  DATE OF BIRTH:  10/01/1931  DATE OF ADMISSION:  03/31/2021  PRIMARY CARE PHYSICIAN: Baxter Hire, MD   Patient is coming from: Home  REQUESTING/REFERRING PHYSICIAN: Rudene Re, MD  CHIEF COMPLAINT:   Chief Complaint  Patient presents with   Weakness    HISTORY OF PRESENT ILLNESS:  [name] is a 87 y.o. Caucasian female with medical history significant for multiple medical problems that are mentioned below including hypertension, type 2 diabetes mellitus, and polycythemia vera followed by Dr. Janese Banks, who presented to the ER with acute onset of generalized weakness and near syncope.  Her niece mentioned that she has been getting progressively weak over the last couple of days..  The patient denies any cough or wheezing or dyspnea.  No chest pain or palpitations.  No nausea or vomiting or abdominal pain.  No melena or bright red bleeding per rectum.  No hematemesis or jaundice.  She denies any fever or chills.  ED Course: When she came to the ER blood pressure was 151/69 with otherwise normal vital signs.  Labs revealed blood glucose of 216 and creatinine of 1.25 with a BUN of 39.  CBC showed WC of 2.4 and hemoglobin of 7.7 hematocrit 22.3 down from 10.1 and 30.5 on 9/20.  UA was unremarkable. Influenza antigens and COVID-19 PCR came back negative.  INR was 1.3 and PT 16.6 with PTT of 34.  Stool Hemoccult upon rectal exam was negative.  EKG as reviewed by me : Showed normal sinus rhythm with rate of 62 with first-degree AV block and low voltage QRS with left anterior fascicular block and Q waves anteroseptally. Imaging: Noncontrasted CT scan revealed no acute intracranial normalities.  The patient was typed and crossmatch and ordered 1 unit of packed red blood cells.  She will be admitted to an observation medical monitored bed for further evaluation and management. PAST MEDICAL HISTORY:   Past Medical  History:  Diagnosis Date   Adenocarcinoma in situ of cervix    Arthritis    hands   Breast cancer ([place])    Colon cancer ([place])    Diabetes mellitus without complication ([place])    Endometrial carcinoma ([place])    s/p total abdominal hysterectomy   H/O compression fracture of spine 2014   thoracic spine   H/O polycythemia vera    H/O TIA (transient ischemic attack) and stroke 09/2014, 03/2015   No deficits   Hypertension    Hyperuricemia    Microalbuminuria    Myocardial infarction ([place])    Polycythemia vera ([place])    Recurrent falls    Skin cancer    face, legs   Stroke ([place]) 2008   no deficits   Trochanteric bursitis    Varicose veins    treated    PAST SURGICAL HISTORY:   Past Surgical History:  Procedure Laterality Date   ABDOMINAL HYSTERECTOMY     BOWEL RESECTION N/A 03/28/2015   Procedure: SMALL BOWEL RESECTION;  Surgeon: Leonie Green, MD;  Location: [facility];  Service: General;  Laterality: N/A;   CATARACT EXTRACTION W/ INTRAOCULAR LENS IMPLANT Right    CATARACT EXTRACTION W/PHACO Left 10/07/2015   Procedure: CATARACT EXTRACTION PHACO AND INTRAOCULAR LENS PLACEMENT ([place]);  Surgeon: Ronnell Freshwater, MD;  Location: [facility];  Service: Ophthalmology;  Laterality: Left;  DIABETIC - oral meds VISION BLUE   CORONARY ANGIOGRAPHY N/A 10/29/2017  Procedure: CORONARY ANGIOGRAPHY;  Surgeon: Dionisio David, MD;  Location: [facility];  Service: Cardiovascular;  Laterality: N/A;   EXPLORATORY LAPAROTOMY     for fibroids   HIP ARTHROPLASTY Left 12/01/2018   Procedure: ARTHROPLASTY BIPOLAR HIP (HEMIARTHROPLASTY);  Surgeon: Corky Mull, MD;  Location: [facility];  Service: Orthopedics;  Laterality: Left;   LEFT HEART CATH Right 10/29/2017   Procedure: Left Heart Cath;  Surgeon: Dionisio David, MD;  Location: [place] [facility];  Service: Cardiovascular;  Laterality: Right;   TONSILLECTOMY      SOCIAL HISTORY:   Social History   Tobacco Use    Smoking status: Former   Smokeless tobacco: Never  Substance Use Topics   Alcohol use: No    FAMILY HISTORY:   Family History  Problem Relation Age of Onset   Cancer Brother        AML   Heart attack Mother    Breast cancer Mother    Heart attack Father    Diabetes Father    Heart attack Sister    Diabetes Sister     DRUG ALLERGIES:   Allergies  Allergen Reactions   Other Other (See Comments)    ALL OPIOIDS    Simvastatin Other (See Comments)    Myalgia    REVIEW OF SYSTEMS:   ROS As per history of present illness. All pertinent systems were reviewed above. Constitutional, HEENT, cardiovascular, respiratory, GI, GU, musculoskeletal, neuro, psychiatric, endocrine, integumentary and hematologic systems were reviewed and are otherwise negative/unremarkable except for positive findings mentioned above in the HPI.   MEDICATIONS AT HOME:   Prior to Admission medications   Medication Sig Start Date End Date Taking? Authorizing Provider  acetaminophen (TYLENOL) 325 MG tablet Take 1-2 tablets (325-650 mg total) by mouth every 6 (six) hours as needed for mild pain (pain score 1-3 or temp > 100.5). Patient taking differently: Take 650 mg by mouth 3 (three) times daily. 12/04/18  Yes Duanne Guess, PA-C  amLODipine (NORVASC) 2.5 MG tablet Take 5 mg by mouth daily. 02/03/21  Yes [provider]  calcium-vitamin D (OSCAL WITH D) 500-200 MG-UNIT per tablet Take 1 tablet by mouth daily.    Yes [provider]  carvedilol (COREG) 25 MG tablet Take 1 tablet (25 mg total) by mouth 2 (two) times daily with a meal. Patient taking differently: Take 12.5 mg by mouth 2 (two) times daily with a meal. 11/16/17  Yes Angiulli, Lavon Paganini, PA-C  clopidogrel (PLAVIX) 75 MG tablet TAKE 1 TABLET BY MOUTH DAILY 11/02/19  Yes McCue, Janett Billow, NP  Docusate Sodium (STOOL SOFTENER LAXATIVE PO) Take 1 capsule by mouth daily.   Yes [provider]  fexofenadine (ALLEGRA ALLERGY) 180  MG tablet Take 1 tablet (180 mg total) by mouth daily. 12/17/17  Yes McCue, Janett Billow, NP  fluticasone (FLONASE) 50 MCG/ACT nasal spray Place 2 sprays into the nose daily.   Yes [provider]  furosemide (LASIX) 20 MG tablet Take 20 mg by mouth daily. 07/21/20  Yes [provider]  hydroxyurea (HYDREA) 500 MG capsule Take 500mg  on Monday, Wednesday,Friday and Sunday.Take 1000mg  on Tuesday, Thursday and Saturdays. May take with food to minimize GI side effects. 01/13/21  Yes Sindy Guadeloupe, MD  insulin detemir (LEVEMIR) 100 unit/ml SOLN Inject 0.08 mLs (8 Units total) into the skin at bedtime. Patient taking differently: Inject 12 Units into the skin at bedtime. 11/17/17  Yes Angiulli, Lavon Paganini, PA-C  octreotide (SANDOSTATIN  LAR) 10 MG injection Inject 10 mg into the muscle every 28 (twenty-eight) days.   Yes [provider]  pantoprazole (PROTONIX) 40 MG tablet Take 1 tablet (40 mg total) by mouth daily. 11/17/17  Yes Angiulli, Lavon Paganini, PA-C  pravastatin (PRAVACHOL) 20 MG tablet Take 1 tablet (20 mg total) by mouth at bedtime. 11/16/17  Yes Angiulli, Lavon Paganini, PA-C  QUEtiapine (SEROQUEL) 50 MG tablet Take 50 mg by mouth at bedtime.    Yes [provider]  ramipril (ALTACE) 5 MG capsule Take 5 mg by mouth 2 (two) times daily.   Yes [provider]  sertraline (ZOLOFT) 50 MG tablet Take 1 tablet (50 mg total) by mouth daily. 07/05/19  Yes McCue, Janett Billow, NP  spironolactone (ALDACTONE) 25 MG tablet Take 0.5 tablets (12.5 mg total) by mouth every other day for 30 days. Patient taking differently: Take 12.5 mg by mouth See admin instructions. Take 12.5 mg by mouth twice daily every other day 12/04/18 04/01/21 Yes Pyreddy, Reatha Harps, MD  Telotristat Ethyl,as Etiprate, (XERMELO) 250 MG TABS TAKE 1 TABLET (250 MG) BY MOUTH 3 (THREE) TIMES DAILY WITH MEALS. 03/28/21  Yes Verlon Au, NP  traMADol (ULTRAM) 50 MG tablet 25 mg every other day.  08/09/19  Yes [provider]      VITAL SIGNS:  Blood pressure (!) 164/86, pulse 69, temperature 97.6 F (36.4 C), temperature source Oral, resp. rate 16, height 5' (1.524 m), weight 39 kg, SpO2 98 %.  PHYSICAL EXAMINATION:  Physical Exam  GENERAL:  87 y.o.-year-old Caucasian female patient lying in the bed with no acute distress.  EYES: Pupils equal, round, reactive to light and accommodation.  Positive pallor.  No scleral icterus. Extraocular muscles intact.  HEENT: Head atraumatic, normocephalic. Oropharynx and nasopharynx clear.  NECK:  Supple, no jugular venous distention. No thyroid enlargement, no tenderness.  LUNGS: Normal breath sounds bilaterally, no wheezing, rales,rhonchi or crepitation. No use of accessory muscles of respiration.  CARDIOVASCULAR: Regular rate and rhythm, S1, S2 normal. No murmurs, rubs, or gallops.  ABDOMEN: Soft, nondistended, nontender. Bowel sounds present. No organomegaly or mass.  EXTREMITIES: No pedal edema, cyanosis, or clubbing.  NEUROLOGIC: Cranial nerves II through XII are intact. Muscle strength 5/5 in all extremities. Sensation intact. Gait not checked.  PSYCHIATRIC: The patient is alert and oriented x 3.  Normal affect and good eye contact. SKIN: No obvious rash, lesion, or ulcer.   LABORATORY PANEL:   CBC Recent Labs  Lab 03/31/21 1457  WBC 2.4*  HGB 7.7*  HCT 22.3*  PLT 165   ------------------------------------------------------------------------------------------------------------------  Chemistries  Recent Labs  Lab 03/31/21 1457  NA 137  K 3.6  CL 99  CO2 29  GLUCOSE 216*  BUN 39*  CREATININE 1.25*  CALCIUM 8.4*   ------------------------------------------------------------------------------------------------------------------  Cardiac Enzymes No results for input(s): TROPONINI in the last 168 hours. ------------------------------------------------------------------------------------------------------------------  RADIOLOGY:   CT HEAD WO CONTRAST (5MM)  Result Date: 03/31/2021 CLINICAL DATA:  Head trauma EXAM: CT HEAD WITHOUT CONTRAST TECHNIQUE: Contiguous axial images were obtained from the base of the skull through the vertex without intravenous contrast. COMPARISON:  Brain MRI 11/01/2017, CT head 07/18/2019 FINDINGS: Brain: There is no evidence of acute intracranial hemorrhage, extra-axial fluid collection, or acute infarct. Mild encephalomalacia in the right cerebellar hemisphere is consistent with the prior intraparenchymal hematoma in this location. There is mild parenchymal volume loss with commensurate enlargement of the ventricular system. Foci of hypodensity in the subcortical and periventricular white matter likely  reflects sequela of advanced chronic white matter microangiopathy, unchanged. There is no mass lesion.  There is no midline shift. Vascular: There is calcification of the bilateral cavernous ICAs. Skull: Normal. Negative for fracture or focal lesion. Sinuses/Orbits: There is mild mucosal thickening in the ethmoid air cells. Bilateral lens implants are in place. The globes and orbits are otherwise unremarkable. Other: None. IMPRESSION: 1. No acute intracranial hemorrhage or calvarial fracture. 2. Unchanged global parenchymal volume loss and chronic white matter microangiopathy. Electronically Signed   By: Valetta Mole M.D.   On: 03/31/2021 16:31      IMPRESSION AND PLAN:  Active Problems:   Near syncope  1.  Symptomatic anemia with presyncope and history of polycythemia vera. - The patient will be admitted to observation medically monitored bed. - She was typed and crossmatched and will be transfused 1 unit of packed red blood cells. - We will follow posttransfusion H&H. - The patient's stool Hemoccult came back negative. - We will follow her orthostatics. - She will be hydrated with IV normal saline. -Hematology consult will be obtained. - I notified Dr. Tasia Catchings about the patient  2.  Essential  hypertension. - We will continue her Norvasc and hold off ramipril given mild acute kidney injury.  3.  Mild acute kidney injury. - We will hold off nephrotoxins and hydrate with IV normal saline following BMP..  4.  GERD. - We will continue PPI therapy.  5.  Depression. - We will continue Zoloft.   DVT prophylaxis: Lovenox.  Code Status: The patient is DNR/DNI. Family Communication:  The plan of care was discussed in details with the patient (and family). I answered all questions. The patient agreed to proceed with the above mentioned plan. Further management will depend upon hospital course. Disposition Plan: Back to previous home environment Consults called: none. All the records are reviewed and case discussed with ED provider.  Status is: Observation  The patient remains OBS appropriate and will d/c before 2 midnights.  Dispo: The patient is from: Home              Anticipated d/c is to: Home              Patient currently is not medically stable to d/c.   Difficult to place patient No      TOTAL TIME TAKING CARE OF THIS PATIENT: 55 minutes.    Christel Mormon M.D on 04/01/2021 at 1:25 AM  [facility]   From 7 PM-7 AM, contact night-coverage [email or web address]  CC: Primary care physician; Baxter Hire, MD

## 2021-04-01 NOTE — TOC Progression Note (Signed)
Transition of Care [facility]) - Progression Note    Patient Details  Name: [name] MRN: [id number] Date of Birth: 10/22/1931  Transition of Care [facility]) CM/SW [place], RN Phone Number: 04/01/2021, 2:30 PM  Clinical Narrative:   [facility] consult in, attempted to see pati ent, only oriented to self.  Messge left for niece, POA, awaiting return call.         Expected Discharge Plan and Services                                                 Social Determinants of Health (SDOH) Interventions    Readmission Risk Interventions Readmission Risk Prevention Plan 12/04/2018  Transportation Screening Complete  Medication Review [employer]) Complete  PCP or Specialist appointment within 3-5 days of discharge Complete  HRI or Home Care Consult Complete  SW Recovery Care/Counseling Consult Complete  Palliative Care Screening Complete  Skilled Nursing Facility Complete  Some recent data might be hidden

## 2021-04-01 NOTE — Progress Notes (Signed)
Anticoagulation monitoring(Lovenox):  87 yo  female ordered Lovenox 40 mg Q24h    Filed Weights   03/31/21 2349  Weight: 39 kg (86 lb)   BMI 16.8    Lab Results  Component Value Date   CREATININE 1.25 (H) 03/31/2021   CREATININE 1.33 (H) 03/11/2021   CREATININE 1.08 (H) 12/06/2020   Estimated Creatinine Clearance: 19.5 mL/min (A) (by C-G formula based on SCr of 1.25 mg/dL (H)). Hemoglobin & Hematocrit     Component Value Date/Time   HGB 7.7 (L) 03/31/2021 1457   HGB 12.2 10/15/2014 1250   HCT 22.3 (L) 03/31/2021 1457   HCT 39.4 10/15/2014 1250     Per Protocol for Patient with estCrcl < 30 ml/min and BMI < 40, will transition to Lovenox 30 mg Q24h.

## 2021-04-01 NOTE — Progress Notes (Signed)
Do not give sandostatin per Cristie Hem in pharmacy.

## 2021-04-01 NOTE — ED Provider Notes (Signed)
[facility] Emergency Department Provider Note  ____________________________________________  Time seen: Approximately 1:06 AM  I have reviewed the triage vital signs and the nursing notes.   HISTORY  Chief Complaint Weakness   HPI [name] is a 87 y.o. female with a history of polycythemia vera on hydroxyurea, diabetes, CVA on Plavix who presents for evaluation of near syncopal episode.  According to patient's niece who is at bedside, patient has been getting progressively weaker over the last couple of days.  Today had a near syncopal event.  Never fully lost consciousness or collapse.  No hematuria, hematemesis, melena.  No headache, no slurred speech, no facial droop, no unilateral weakness or numbness, no chest pain or shortness of breath.  No fever, no cough, no dysuria, no vomiting or diarrhea, no abdominal pain.   Past Medical History:  Diagnosis Date   Adenocarcinoma in situ of cervix    Arthritis    hands   Breast cancer ([place])    Colon cancer ([place])    Diabetes mellitus without complication ([place])    Endometrial carcinoma ([place])    s/p total abdominal hysterectomy   H/O compression fracture of spine 2014   thoracic spine   H/O polycythemia vera    H/O TIA (transient ischemic attack) and stroke 09/2014, 03/2015   No deficits   Hypertension    Hyperuricemia    Microalbuminuria    Myocardial infarction ([place])    Polycythemia vera ([place])    Recurrent falls    Skin cancer    face, legs   Stroke ([place]) 2008   no deficits   Trochanteric bursitis    Varicose veins    treated    Patient Active Problem List   Diagnosis Date Noted   Near syncope 04/01/2021   Leg pain 04/05/2020   PAD (peripheral artery disease) ([place]) 04/05/2020   Palliative care encounter    B12 deficiency    Pancytopenia ([place]) 07/18/2019   Status post hip hemiarthroplasty 12/02/2018   Closed left hip fracture ([place]) 11/30/2018   CRF (chronic renal failure), stage 2  (mild) 10/28/2018   Liver lesion 04/22/2018   Goals of care, counseling/discussion 04/22/2018   Cognitive deficit, post-stroke 12/31/2017   Ataxia, post-stroke 12/31/2017   Intracranial hemorrhage ([place]) 12/03/2017   Dysarthria, post-stroke    Gait disturbance, post-stroke    H/O cerebral parenchymal hemorrhage 11/04/2017   Essential hypertension 11/04/2017   Hyperlipidemia 11/04/2017   Diabetes ([place]) 11/04/2017   CAD (coronary artery disease) 11/04/2017   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   Hypokalemia 11/04/2017   Right-sided nontraumatic intracerebral hemorrhage of cerebellum ([place])    History of cervical cancer    History of TIA (transient ischemic attack)    Acute systolic congestive heart failure ([place])    Reactive hypertension    Hypernatremia    Leukocytosis    Acute blood loss anemia    Elevated serum creatinine    Acute respiratory failure with hypoxia (HCC)    IVH (intraventricular hemorrhage) ([place]) 10/29/2017   Hypoxia 10/28/2017   Depression, prolonged 09/02/2017   Pancreatic lesion 05/24/2017   Osteoarthritis of both knees 08/31/2016   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   Nodule of upper lobe of left lung 06/04/2015   Dizziness 04/15/2015   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 [facility]) 04/11/2015   Cerebral thrombosis with cerebral infarction 04/03/2015   Cancer of right colon ([place]) 03/28/2015   CVA (cerebral infarction) 12/21/2014   Microalbuminuria 09/24/2014   Hyperuricemia 06/13/2014  Polycythemia 06/13/2014   Diabetes mellitus type 2, uncomplicated ([place]) 16/03/9603   Trochanteric bursitis 12/09/2013   Basal cell carcinoma of skin of lip 05/11/2013   Polycythemia vera ([place]) 06/22/2006    Past Surgical History:  Procedure Laterality Date   ABDOMINAL HYSTERECTOMY     BOWEL RESECTION N/A 03/28/2015   Procedure: SMALL BOWEL RESECTION;  Surgeon: Leonie Green, MD;  Location: [facility] ORS;  Service: General;  Laterality: N/A;   CATARACT  EXTRACTION W/ INTRAOCULAR LENS IMPLANT Right    CATARACT EXTRACTION W/PHACO Left 10/07/2015   Procedure: CATARACT EXTRACTION PHACO AND INTRAOCULAR LENS PLACEMENT ([place]);  Surgeon: Ronnell Freshwater, MD;  Location: [facility];  Service: Ophthalmology;  Laterality: Left;  DIABETIC - oral meds VISION BLUE   CORONARY ANGIOGRAPHY N/A 10/29/2017   Procedure: CORONARY ANGIOGRAPHY;  Surgeon: Dionisio David, MD;  Location: [facility];  Service: Cardiovascular;  Laterality: N/A;   EXPLORATORY LAPAROTOMY     for fibroids   HIP ARTHROPLASTY Left 12/01/2018   Procedure: ARTHROPLASTY BIPOLAR HIP (HEMIARTHROPLASTY);  Surgeon: Corky Mull, MD;  Location: [facility] OR[facility];  Service: Orthopedics;  Laterality: Left;   LEFT HEART CATH Right 10/29/2017   Procedure: Left Heart Cath;  Surgeon: Dionisio David, MD;  Location: [facility];  Service: Cardiovascular;  Laterality: Right;   TONSILLECTOMY      Prior to Admission medications   Medication Sig Start Date End Date Taking? Authorizing Provider  acetaminophen (TYLENOL) 325 MG tablet Take 1-2 tablets (325-650 mg total) by mouth every 6 (six) hours as needed for mild pain (pain score 1-3 or temp > 100.5). Patient taking differently: Take 650 mg by mouth 3 (three) times daily. 12/04/18  Yes Duanne Guess, PA-C  amLODipine (NORVASC) 2.5 MG tablet Take 5 mg by mouth daily. 02/03/21  Yes [provider]  calcium-vitamin D (OSCAL WITH D) 500-200 MG-UNIT per tablet Take 1 tablet by mouth daily.    Yes [provider]  carvedilol (COREG) 25 MG tablet Take 1 tablet (25 mg total) by mouth 2 (two) times daily with a meal. Patient taking differently: Take 12.5 mg by mouth 2 (two) times daily with a meal. 11/16/17  Yes Angiulli, Lavon Paganini, PA-C  clopidogrel (PLAVIX) 75 MG tablet TAKE 1 TABLET BY MOUTH DAILY 11/02/19  Yes McCue, Janett Billow, NP  Docusate Sodium (STOOL SOFTENER LAXATIVE PO) Take 1 capsule by mouth daily.   Yes [provider]  fexofenadine (ALLEGRA ALLERGY) 180 MG tablet Take 1 tablet (180 mg total) by mouth daily. 12/17/17  Yes McCue, Janett Billow, NP  fluticasone (FLONASE) 50 MCG/ACT nasal spray Place 2 sprays into the nose daily.   Yes [provider]  furosemide (LASIX) 20 MG tablet Take 20 mg by mouth daily. 07/21/20  Yes [provider]  hydroxyurea (HYDREA) 500 MG capsule Take 500mg  on Monday, Wednesday,Friday and Sunday.Take 1000mg  on Tuesday, Thursday and Saturdays. May take with food to minimize GI side effects. 01/13/21  Yes Sindy Guadeloupe, MD  insulin detemir (LEVEMIR) 100 unit/ml SOLN Inject 0.08 mLs (8 Units total) into the skin at bedtime. Patient taking differently: Inject 12 Units into the skin at bedtime. 11/17/17  Yes Angiulli, Lavon Paganini, PA-C  octreotide (SANDOSTATIN LAR) 10 MG injection Inject 10 mg into the muscle every 28 (twenty-eight) days.   Yes [provider]  pantoprazole (PROTONIX) 40 MG tablet Take 1 tablet (40 mg total) by mouth daily. 11/17/17  Yes Angiulli, Lavon Paganini, PA-C  pravastatin (  PRAVACHOL) 20 MG tablet Take 1 tablet (20 mg total) by mouth at bedtime. 11/16/17  Yes Angiulli, Lavon Paganini, PA-C  QUEtiapine (SEROQUEL) 50 MG tablet Take 50 mg by mouth at bedtime.    Yes [provider]  ramipril (ALTACE) 5 MG capsule Take 5 mg by mouth 2 (two) times daily.   Yes [provider]  sertraline (ZOLOFT) 50 MG tablet Take 1 tablet (50 mg total) by mouth daily. 07/05/19  Yes McCue, Janett Billow, NP  spironolactone (ALDACTONE) 25 MG tablet Take 0.5 tablets (12.5 mg total) by mouth every other day for 30 days. Patient taking differently: Take 12.5 mg by mouth See admin instructions. Take 12.5 mg by mouth twice daily every other day 12/04/18 04/01/21 Yes Pyreddy, Reatha Harps, MD  Telotristat Ethyl,as Etiprate, (XERMELO) 250 MG TABS TAKE 1 TABLET (250 MG) BY MOUTH 3 (THREE) TIMES DAILY WITH MEALS. 03/28/21  Yes Verlon Au, NP  traMADol (ULTRAM) 50 MG tablet 25  mg every other day.  08/09/19  Yes [provider]    Allergies Other and Simvastatin  Family History  Problem Relation Age of Onset   Cancer Brother        AML   Heart attack Mother    Breast cancer Mother    Heart attack Father    Diabetes Father    Heart attack Sister    Diabetes Sister     Social History Social History   Tobacco Use   Smoking status: Former   Smokeless tobacco: Never  Scientific laboratory technician Use: Never used  Substance Use Topics   Alcohol use: No   Drug use: No    Review of Systems  Constitutional: Negative for fever. + Generalized weakness and near syncope Eyes: Negative for visual changes. ENT: Negative for sore throat. Neck: No neck pain  Cardiovascular: Negative for chest pain. Respiratory: Negative for shortness of breath. Gastrointestinal: Negative for abdominal pain, vomiting or diarrhea. Genitourinary: Negative for dysuria. Musculoskeletal: Negative for back pain. Skin: Negative for rash. Neurological: Negative for headaches, weakness or numbness. Psych: No SI or HI  ____________________________________________   PHYSICAL EXAM:  VITAL SIGNS: ED Triage Vitals  Enc Vitals Group     BP 03/31/21 1453 (!) 151/69     Pulse Rate 03/31/21 1453 61     Resp 03/31/21 1453 18     Temp 03/31/21 1454 97.6 F (36.4 C)     Temp Source 03/31/21 1454 Oral     SpO2 03/31/21 1453 97 %     Weight 03/31/21 2349 86 lb (39 kg)     Height 03/31/21 2349 5' (1.524 m)     Head Circumference --      Peak Flow --      Pain Score 03/31/21 1450 0     Pain Loc --      Pain Edu? --      Excl. in [place]? --     Constitutional: Alert and oriented. Well appearing and in no apparent distress. HEENT:      Head: Normocephalic and atraumatic.         Eyes: Conjunctivae are normal. Sclera is non-icteric.       Mouth/Throat: Mucous membranes are moist.       Neck: Supple with no signs of meningismus. Cardiovascular: Regular rate and rhythm. No murmurs,  gallops, or rubs. 2+ symmetrical distal pulses are present in all extremities. No JVD. Respiratory: Normal respiratory effort. Lungs are clear to auscultation bilaterally.  Gastrointestinal: Soft, non  tender, and non distended with positive bowel sounds. No rebound or guarding. Genitourinary: No CVA tenderness.  Rectal exam showing light brown stool guaiac negative Musculoskeletal:  No edema, cyanosis, or erythema of extremities. Neurologic: Normal speech and language. Face is symmetric. Moving all extremities. No gross focal neurologic deficits are appreciated. Skin: Skin is warm, dry and intact. No rash noted. Psychiatric: Mood and affect are normal. Speech and behavior are normal.  ____________________________________________   LABS (all labs ordered are listed, but only abnormal results are displayed)  Labs Reviewed  BASIC METABOLIC PANEL - Abnormal; Notable for the following components:      Result Value   Glucose, Bld 216 (*)    BUN 39 (*)    Creatinine, Ser 1.25 (*)    Calcium 8.4 (*)    GFR, Estimated 42 (*)    All other components within normal limits  CBC - Abnormal; Notable for the following components:   WBC 2.4 (*)    RBC 1.76 (*)    Hemoglobin 7.7 (*)    HCT 22.3 (*)    MCV 126.7 (*)    MCH 43.8 (*)    RDW 19.6 (*)    All other components within normal limits  URINALYSIS, COMPLETE (UACMP) WITH MICROSCOPIC - Abnormal; Notable for the following components:   Color, Urine YELLOW (*)    APPearance CLEAR (*)    Protein, ur 30 (*)    Bacteria, UA RARE (*)    All other components within normal limits  PROTIME-INR - Abnormal; Notable for the following components:   Prothrombin Time 16.6 (*)    INR 1.3 (*)    All other components within normal limits  CBG MONITORING, ED - Abnormal; Notable for the following components:   Glucose-Capillary 231 (*)    All other components within normal limits  RESP PANEL BY RT-PCR (FLU A&B, COVID) ARPGX2  APTT  TYPE AND SCREEN   PREPARE RBC (CROSSMATCH)   ____________________________________________  EKG  ED ECG REPORT I, Rudene Re, the attending physician, personally viewed and interpreted this ECG.  Sinus rhythm with first-degree AV block, rate of 62, low voltage QRS, diffuse T wave flattening with no ST elevations and anterior Q waves. ____________________________________________  RADIOLOGY  I have personally reviewed the images performed during this visit and I agree with the Radiologist's read.   Interpretation by Radiologist:  CT HEAD WO CONTRAST (5MM)  Result Date: 03/31/2021 CLINICAL DATA:  Head trauma EXAM: CT HEAD WITHOUT CONTRAST TECHNIQUE: Contiguous axial images were obtained from the base of the skull through the vertex without intravenous contrast. COMPARISON:  Brain MRI 11/01/2017, CT head 07/18/2019 FINDINGS: Brain: There is no evidence of acute intracranial hemorrhage, extra-axial fluid collection, or acute infarct. Mild encephalomalacia in the right cerebellar hemisphere is consistent with the prior intraparenchymal hematoma in this location. There is mild parenchymal volume loss with commensurate enlargement of the ventricular system. Foci of hypodensity in the subcortical and periventricular white matter likely reflects sequela of advanced chronic white matter microangiopathy, unchanged. There is no mass lesion.  There is no midline shift. Vascular: There is calcification of the bilateral cavernous ICAs. Skull: Normal. Negative for fracture or focal lesion. Sinuses/Orbits: There is mild mucosal thickening in the ethmoid air cells. Bilateral lens implants are in place. The globes and orbits are otherwise unremarkable. Other: None. IMPRESSION: 1. No acute intracranial hemorrhage or calvarial fracture. 2. Unchanged global parenchymal volume loss and chronic white matter microangiopathy. Electronically Signed   By: Court Joy.D.  On: 03/31/2021 16:31      ____________________________________________   PROCEDURES  Procedure(s) performed:yes .1-3 Lead EKG Interpretation Performed by: Rudene Re, MD Authorized by: Rudene Re, MD     Interpretation: abnormal     ECG rate assessment: normal     Rhythm: sinus rhythm     Ectopy: none     Conduction: abnormal     Critical Care performed:  yes  CRITICAL CARE Performed by: Rudene Re  ?  Total critical care time: 30 min  Critical care time was exclusive of separately billable procedures and treating other patients.  Critical care was necessary to treat or prevent imminent or life-threatening deterioration.  Critical care was time spent personally by me on the following activities: development of treatment plan with patient and/or surrogate as well as nursing, discussions with consultants, evaluation of patient's response to treatment, examination of patient, obtaining history from patient or surrogate, ordering and performing treatments and interventions, ordering and review of laboratory studies, ordering and review of radiographic studies, pulse oximetry and re-evaluation of patient's condition.  ____________________________________________   INITIAL IMPRESSION / ASSESSMENT AND PLAN / ED COURSE  87 y.o. female with a history of polycythemia vera on hydroxyurea, diabetes, CVA on Plavix who presents for evaluation of near syncopal episode in the setting of 2 days of progressively worsening generalized weakness.  Patient is neurologically intact other than mild dysarthria which is chronic per niece was at bedside.  Abdomen is soft and nontender, normal work of breathing and normal sats.  Rectal exam showing brown stool without any evidence of bleeding.  Blood work showing anemia with hemoglobin of 7.7, patient's baseline is usually 12.  No signs of active bleeding.  Also leukopenia with white count of 2.4.  No significant electrolyte derangements, no changes in  baseline creatinine, no signs of sepsis or UTI.  Head CT with no acute findings.  Patient consented for blood transfusion.  We will transfuse 1 unit and admit to the hospitalist service for evaluation by her oncologist in the morning.  Patient placed on telemetry for monitoring of cardiorespiratory status.  Old medical records reviewed.  History gathered from patient and her niece was at bedside.  Plan discussed with both of them.      _____________________________________________ Please note:  Patient was evaluated in Emergency Department today for the symptoms described in the history of present illness. Patient was evaluated in the context of the global COVID-19 pandemic, which necessitated consideration that the patient might be at risk for infection with the SARS-CoV-2 virus that causes COVID-19. Institutional protocols and algorithms that pertain to the evaluation of patients at risk for COVID-19 are in a state of rapid change based on information released by regulatory bodies including the CDC and federal and state organizations. These policies and algorithms were followed during the patient's care in the ED.  Some ED evaluations and interventions may be delayed as a result of limited staffing during the pandemic.   [place] Controlled Substance Database was reviewed by me. ____________________________________________   FINAL CLINICAL IMPRESSION(S) / ED DIAGNOSES   Final diagnoses:  Anemia, unspecified type  Near syncope      NEW MEDICATIONS STARTED DURING THIS VISIT:  ED Discharge Orders     None        Note:  This document was prepared using Dragon voice recognition software and may include unintentional dictation errors.    Rudene Re, MD 04/01/21 0110

## 2021-04-01 NOTE — Progress Notes (Signed)
*  PRELIMINARY RESULTS* Echocardiogram 2D Echocardiogram has been performed.  Sherrie Sport 04/01/2021, 1:59 PM

## 2021-04-01 NOTE — Progress Notes (Signed)
Inpatient Diabetes Program Recommendations  AACE/ADA: New Consensus Statement on Inpatient Glycemic Control (2015)  Target Ranges:  Prepandial:   less than 140 mg/dL      Peak postprandial:   less than 180 mg/dL (1-2 hours)      Critically ill patients:  140 - 180 mg/dL   Lab Results  Component Value Date   GLUCAP 230 (H) 04/01/2021   HGBA1C 5.3 07/18/2019    Review of Glycemic Control Results for [name], [name] (MRN [id number]) as of 04/01/2021 14:39  Ref. Range 04/01/2021 00:11 04/01/2021 12:31  Glucose-Capillary Latest Ref Range: 70 - 99 mg/dL 231 (H) 230 (H)   Diabetes history: DM 2 Outpatient Diabetes medications:  Levemir 12 units q HS Current orders for Inpatient glycemic control:  Novolog sensitive tid with meals and HS Levemir 12 units q HS  Inpatient Diabetes Program Recommendations:    Consider reducing Levemir to 6 units q HS.   Thanks,  Adah Perl, RN, BC-ADM Inpatient Diabetes Coordinator Pager [phone]  (8a-5p)

## 2021-04-01 NOTE — TOC Initial Note (Signed)
Transition of Care [facility]) - Initial/Assessment Note    Patient Details  Name: [name] MRN: [id number] Date of Birth: Dec 29, 1931  Transition of Care [facility]) CM/SW Contact:    Pete Pelt, RN Phone Number: 04/01/2021, 4:28 PM  Clinical Narrative:    Patient lives at home, has 24 hour caregiver as per niece.  Caregiver and nieces provide transportation and assistance with medication.  Niece declines DME, states patient has all needed devices at home.  Niece declines SNF placement, states they have been caring for patient at home for an extended period of time and patient does not consent to SNF.  No further toc needs from patient or niece.                 Expected Discharge Plan: Home/Self Care (Patient has a 24 hour care giver)     Patient Goals and CMS Choice     Choice offered to / list presented to : NA  Expected Discharge Plan and Services Expected Discharge Plan: Home/Self Care (Patient has a 24 hour care giver)     Post Acute Care Choice: NA Living arrangements for the past 2 months: Single Family Home Expected Discharge Date: 04/01/21                                    Prior Living Arrangements/Services Living arrangements for the past 2 months: Single Family Home Lives with:: Self (Caregiver with neice's support) Patient language and need for interpreter reviewed:: Yes (No interpreter needed) Do you feel safe going back to the place where you live?: Yes      Need for Family Participation in Patient Care: Yes (Comment) Care giver support system in place?: Yes (comment)   Criminal Activity/Legal Involvement Pertinent to Current Situation/Hospitalization: No - Comment as needed  Activities of Daily Living Home Assistive Devices/Equipment: None ADL Screening (condition at time of admission) Patient's cognitive ability adequate to safely complete daily activities?: No Is the patient deaf or have difficulty hearing?: Yes Does the patient have difficulty  seeing, even when wearing glasses/contacts?: Yes Does the patient have difficulty concentrating, remembering, or making decisions?: Yes Patient able to express need for assistance with ADLs?: Yes Does the patient have difficulty dressing or bathing?: Yes Independently performs ADLs?: No Communication: Independent Dressing (OT): Needs assistance Is this a change from baseline?: Pre-admission baseline Grooming: Dependent Is this a change from baseline?: Pre-admission baseline Feeding: Needs assistance Is this a change from baseline?: Pre-admission baseline Bathing: Needs assistance Is this a change from baseline?: Pre-admission baseline Toileting: Needs assistance Is this a change from baseline?: Pre-admission baseline In/Out Bed: Needs assistance Is this a change from baseline?: Pre-admission baseline Walks in Home: Needs assistance Is this a change from baseline?: Pre-admission baseline Does the patient have difficulty walking or climbing stairs?: Yes Weakness of Legs: Both Weakness of Arms/Hands: Both  Permission Sought/Granted Permission sought to share information with : Case Manager Permission granted to share information with : Yes, Verbal Permission Granted              Emotional Assessment Appearance:: Appears younger than stated age, Appears stated age Attitude/Demeanor/Rapport:  (Patient sleeping, spoke with niece) Affect (typically observed):  (Patient sleeping, spoke to niece) Orientation: : Oriented to Self Alcohol / Substance Use: Not Applicable Psych Involvement: No (comment)  Admission diagnosis:  Near syncope [R55] Anemia, unspecified type [D64.9] Patient Active Problem List   Diagnosis  Date Noted   Near syncope 04/01/2021   Leg pain 04/05/2020   PAD (peripheral artery disease) ([place]) 04/05/2020   Palliative care encounter    B12 deficiency    Pancytopenia ([place]) 07/18/2019   Status post hip hemiarthroplasty 12/02/2018   Closed left hip fracture ([place])  11/30/2018   CRF (chronic renal failure), stage 2 (mild) 10/28/2018   Liver lesion 04/22/2018   Goals of care, counseling/discussion 04/22/2018   Cognitive deficit, post-stroke 12/31/2017   Ataxia, post-stroke 12/31/2017   Intracranial hemorrhage ([place]) 12/03/2017   Dysarthria, post-stroke    Gait disturbance, post-stroke    H/O cerebral parenchymal hemorrhage 11/04/2017   Essential hypertension 11/04/2017   Hyperlipidemia 11/04/2017   Diabetes ([place]) 11/04/2017   CAD (coronary artery disease) 11/04/2017   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   Hypokalemia 11/04/2017   Right-sided nontraumatic intracerebral hemorrhage of cerebellum ([place])    History of cervical cancer    History of TIA (transient ischemic attack)    Acute systolic congestive heart failure (HCC)    Reactive hypertension    Hypernatremia    Leukocytosis    Acute blood loss anemia    Elevated serum creatinine    Acute respiratory failure with hypoxia (HCC)    IVH (intraventricular hemorrhage) ([place]) 10/29/2017   Hypoxia 10/28/2017   Depression, prolonged 09/02/2017   Pancreatic lesion 05/24/2017   Osteoarthritis of both knees 08/31/2016   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   Nodule of upper lobe of left lung 06/04/2015   Dizziness 04/15/2015   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[place]) 04/11/2015   Cerebral thrombosis with cerebral infarction 04/03/2015   Cancer of right colon ([place]) 03/28/2015   CVA (cerebral infarction) 12/21/2014   Microalbuminuria 09/24/2014   Hyperuricemia 06/13/2014   Polycythemia 06/13/2014   Diabetes mellitus type 2, uncomplicated ([place]) 76/73/4193   Trochanteric bursitis 12/09/2013   Basal cell carcinoma of skin of lip 05/11/2013   Polycythemia vera ([place]) 06/22/2006   PCP:  Baxter Hire, MD Pharmacy:   [place], [place] - [address] [place] [address] Phone: [phone] Fax: [phone]  [address] [place] [address] Phone: [phone] Fax: [phone]     Social Determinants of Health (SDOH) Interventions    Readmission Risk Interventions Readmission Risk Prevention Plan 12/04/2018  Transportation Screening Complete  Medication Review ([place]) Complete  PCP or Specialist appointment within 3-5 days of discharge Complete  HRI or Home Care Consult Complete  SW Recovery Care/Counseling Consult Complete  Palliative Care Screening Complete  Skilled Nursing Facility Complete  Some recent data might be hidden

## 2021-04-01 NOTE — Discharge Summary (Signed)
Physician Discharge Summary  [name] LKG:[id number] DOB: January 15, 1932 DOA: 03/31/2021  PCP: Baxter Hire, MD  Admit date: 03/31/2021 Discharge date: 04/01/2021  Admitted From: Home Disposition: Home  Recommendations for Outpatient Follow-up:  Follow up with PCP in 1-2 weeks Follow up with Dr. Clayborn Bigness at 2:00 pm on 04/02/21 and discuss ECHO Results Follow up with Dr. Janese Banks on 04/10/21 Please obtain CMP/CBC, Mag, Phos in one week Please follow up on the following pending results:  Home Health: No she has 24/7 Care Equipment/Devices: None    Discharge Condition: Stable  CODE STATUS: DO NOT RESUSCITATE Diet recommendation: Heart Healthy Carb Modified Diet   Brief/Interim Summary: The patient is an 87 year old extremely thin and frail cachectic female with a past medical history significant for but not admitted to hypertension, diabetes mellitus type 2, polycythemia vera as well as other comorbidities who presented to ED with generalized onset weakness and near syncope.  Her niece mentioned that she had been progressively weak over the last few days and she denies any cough or wheezing or dyspnea.  When she came to the ED her blood pressure was 150/69 with other normal vital signs.  COVID-19 PCR came back negative and stool Hemoccult on rectal exam was negative.  EKG showed normal sinus rhythm with a rate of 62 and a first-degree AV block.  Her hemoglobin on admission was 7.7 and hematocrit was 22.3 down from 10.1/3.5 back a few weeks ago.  She is typed and screened and transfused 1 unit PRBCs and was admitted as an observation medical monitored bed for further evaluation management for her acute generalized weakness and near syncope. She is being treated for the following but not limited too:  Discharge Diagnoses:  Active Problems:   Near syncope  Symptomatic anemia with presyncope and history of polycythemia vera. - The patient will be admitted to observation medically monitored  bed. - She was typed and crossmatched and will be transfused 1 unit of packed red blood cells. - We will follow posttransfusion H&H and it came back with 10.3/28.6 with an MCV of 114.9 - The patient's stool Hemoccult came back negative. - We will follow her orthostatics. Repeat this AM and q12h - She will be hydrated with IV normal saline and rate reduced to 75 mL/hr -Hematology consult will be obtained. - Dr. Sidney Ace has notified Dr. Tasia Catchings about the patient and Dr. Janese Banks saw the patient and feels she is at baseline and can be safely discharged home with outpatient follow up within 1-2 weeks (Appointment scheduled for 04/10/21 with outpatient labs and Dr. Janese Banks recommending holding Hydrea until then so she can monitor her counts) -U/A was unremarkable  -CT Head w/o Contrast showed "No acute intracranial hemorrhage or calvarial fracture. Unchanged global parenchymal volume loss and chronic white matter microangiopathy."  -Consulted PT and OT to further evaluate and treat for safe discharge disposition but she is will not change management given that she is very frail and has 24 hour Care -We will check troponins and  ECHOCardiogram and Troponins are flat. ECHO done and after discussion with Dr. Sharyne Peach he stated that the ECHO showed "Echo normal ef   No significant valve disease. Small effusion"; she is following up with Dr. Clayborn Bigness tomorrow  -Ok to D/C home and follow up with [facility]   Essential Hypertension. -We will continue her Norvasc and hold off ramipril given mild acute kidney injury while hospitalized. -Will add Hydralzaine 10 mg PRN for SBP >160 or DBP>100 -Continue to monitor  blood pressures per protocol -Last blood pressure reading was elevated to 155/71  Acute Kidney Injury vs CKD Stage 3b -Mild -We will hold off nephrotoxins including Spironolactone, Lasix and Rampipril while hospitalized and hydrate with IV normal saline following BMP as well as avoid contrast dyes,  hypotension and will need to renally adjust medications -Patient's BUNs/creatinine went from 40/1.33 -> 39/1.25 -> 35/1.19 -Continue monitor and repeat CMP in the AM at Dr. Marianna Payment office and resume home meds per his discretion   GERD/GI Prophylaxis  -We will continue PPI therapy with Pantoprazole 40 mg po Daily.   Diabetes Mellitus Type 2 -C/w Levemir 6  units sq Daily and add Sensitive Novolog SSI AC -Continue to Monitor CBGs per protocol  -D/C with 6 units -CBGs ranging from 190-231   HLD -C/w Pravastatin 20 mg po qHS   Depression and Anxiety  - We will continue Sertaline and Quetiapine at D/C    Hx of CVA -C/w Clopidogrel and Atorvastatin   Sleep Apnea -Has had periods of Apnea and Cheyene-Stokes breathing -Family stated she was at baseline -Had ordered CPAP and and Narcan but cancelled after Family states she has been doing that   Discharge Instructions  Discharge Instructions     Call MD for:  difficulty breathing, headache or visual disturbances   Complete by: As directed    Call MD for:  extreme fatigue   Complete by: As directed    Call MD for:  hives   Complete by: As directed    Call MD for:  persistant dizziness or light-headedness   Complete by: As directed    Call MD for:  persistant nausea and vomiting   Complete by: As directed    Call MD for:  redness, tenderness, or signs of infection (pain, swelling, redness, odor or green/yellow discharge around incision site)   Complete by: As directed    Call MD for:  severe uncontrolled pain   Complete by: As directed    Call MD for:  temperature >100.4   Complete by: As directed    Diet - low sodium heart healthy   Complete by: As directed    Discharge instructions   Complete by: As directed    You were cared for by a hospitalist during your hospital stay. If you have any questions about your discharge medications or the care you received while you were in the hospital after you are discharged, you can call  the unit and ask to speak with the hospitalist on call if the hospitalist that took care of you is not available. Once you are discharged, your primary care physician will handle any further medical issues. Please note that NO REFILLS for any discharge medications will be authorized once you are discharged, as it is imperative that you return to your primary care physician (or establish a relationship with a primary care physician if you do not have one) for your aftercare needs so that they can reassess your need for medications and monitor your lab values.  Follow up with PCP, Cardiology, and Hematology within 1-2 weeks. Take all medications as prescribed. If symptoms change or worsen please return to the ED for evaluation   Increase activity slowly   Complete by: As directed       Allergies as of 04/01/2021       Reactions   Other Other (See Comments)   ALL OPIOIDS    Simvastatin Other (See Comments)   Myalgia  Medication List     TAKE these medications    acetaminophen 325 MG tablet Commonly known as: TYLENOL Take 1-2 tablets (325-650 mg total) by mouth every 6 (six) hours as needed for mild pain (pain score 1-3 or temp > 100.5). What changed:  how much to take when to take this   amLODipine 2.5 MG tablet Commonly known as: NORVASC Take 5 mg by mouth daily.   calcium-vitamin D 500-200 MG-UNIT tablet Commonly known as: OSCAL WITH D Take 1 tablet by mouth daily.   carvedilol 25 MG tablet Commonly known as: COREG Take 1 tablet (25 mg total) by mouth 2 (two) times daily with a meal. What changed: how much to take   clopidogrel 75 MG tablet Commonly known as: PLAVIX TAKE 1 TABLET BY MOUTH DAILY   fexofenadine 180 MG tablet Commonly known as: Allegra Allergy Take 1 tablet (180 mg total) by mouth daily.   fluticasone 50 MCG/ACT nasal spray Commonly known as: FLONASE Place 2 sprays into the nose daily.   furosemide 20 MG tablet Commonly known as: LASIX Take  20 mg by mouth daily.   hydroxyurea 500 MG capsule Commonly known as: HYDREA Take 500mg  on Monday, Wednesday,Friday and Sunday.Take 1000mg  on Tuesday, Thursday and Saturdays. May take with food to minimize GI side effects. Hold until seen by Dr. Janese Banks on 04/10/21 Start taking on: April 10, 2021 What changed:  additional instructions These instructions start on April 10, 2021. If you are unsure what to do until then, ask your doctor or other care provider.   insulin detemir 100 UNIT/ML injection Commonly known as: LEVEMIR Inject 0.06 mLs (6 Units total) into the skin at bedtime. Start taking on: April 02, 2021 What changed:  medication strength how much to take   octreotide 10 MG injection Commonly known as: SANDOSTATIN LAR Inject 10 mg into the muscle every 28 (twenty-eight) days.   pantoprazole 40 MG tablet Commonly known as: PROTONIX Take 1 tablet (40 mg total) by mouth daily.   pravastatin 20 MG tablet Commonly known as: PRAVACHOL Take 1 tablet (20 mg total) by mouth at bedtime.   QUEtiapine 50 MG tablet Commonly known as: SEROQUEL Take 50 mg by mouth at bedtime.   ramipril 5 MG capsule Commonly known as: ALTACE Take 5 mg by mouth 2 (two) times daily.   sertraline 50 MG tablet Commonly known as: ZOLOFT Take 1 tablet (50 mg total) by mouth daily.   spironolactone 25 MG tablet Commonly known as: ALDACTONE Take 0.5 tablets (12.5 mg total) by mouth every other day for 30 days. What changed:  when to take this additional instructions   STOOL SOFTENER LAXATIVE PO Take 1 capsule by mouth daily.   traMADol 50 MG tablet Commonly known as: ULTRAM 25 mg every other day.   Xermelo 250 MG Tabs Generic drug: Telotristat Ethyl(as Etiprate) TAKE 1 TABLET (250 MG) BY MOUTH 3 (THREE) TIMES DAILY WITH MEALS.        Follow-up Information     Baxter Hire, MD. Call.   Specialty: Internal Medicine Why: Follow up within 1-2 weeks Contact information: [place] [address] [phone]         Yolonda Kida, MD. Call.   Specialties: Cardiology, Internal Medicine Why: Follow up with Dr. Clayborn Bigness 04/02/21 at 2:00 pm. Contact information: [place] [address] [phone]         Sindy Guadeloupe, MD. Go on 04/10/2021.   Specialty: Oncology  Contact information: [place] [address] [phone]                Allergies  Allergen Reactions   Other Other (See Comments)    ALL OPIOIDS    Simvastatin Other (See Comments)    Myalgia   Consultations: Hematology/Oncology Dr. Janese Banks  Procedures/Studies: CT HEAD WO CONTRAST (5MM)  Result Date: 03/31/2021 CLINICAL DATA:  Head trauma EXAM: CT HEAD WITHOUT CONTRAST TECHNIQUE: Contiguous axial images were obtained from the base of the skull through the vertex without intravenous contrast. COMPARISON:  Brain MRI 11/01/2017, CT head 07/18/2019 FINDINGS: Brain: There is no evidence of acute intracranial hemorrhage, extra-axial fluid collection, or acute infarct. Mild encephalomalacia in the right cerebellar hemisphere is consistent with the prior intraparenchymal hematoma in this location. There is mild parenchymal volume loss with commensurate enlargement of the ventricular system. Foci of hypodensity in the subcortical and periventricular white matter likely reflects sequela of advanced chronic white matter microangiopathy, unchanged. There is no mass lesion.  There is no midline shift. Vascular: There is calcification of the bilateral cavernous ICAs. Skull: Normal. Negative for fracture or focal lesion. Sinuses/Orbits: There is mild mucosal thickening in the ethmoid air cells. Bilateral lens implants are in place. The globes and orbits are otherwise unremarkable. Other: None. IMPRESSION: 1. No acute intracranial hemorrhage or calvarial fracture. 2. Unchanged global parenchymal volume loss and chronic white matter  microangiopathy. Electronically Signed   By: Valetta Mole M.D.   On: 03/31/2021 16:31    ECHOCARDIOGRAM IMPRESSIONS     1. Left ventricular ejection fraction, by estimation, is 65 to 70%. The  left ventricle has normal function. The left ventricle has no regional  wall motion abnormalities. Left ventricular diastolic parameters were  normal.   2. Right ventricular systolic function is normal. The right ventricular  size is normal.   3. A small pericardial effusion is present.   4. The mitral valve is normal in structure. Trivial mitral valve  regurgitation.   5. The aortic valve is normal in structure. Aortic valve regurgitation is  trivial.   FINDINGS   Left Ventricle: Left ventricular ejection fraction, by estimation, is 65  to 70%. The left ventricle has normal function. The left ventricle has no  regional wall motion abnormalities. The left ventricular internal cavity  size was small. There is no left  ventricular hypertrophy. Left ventricular diastolic parameters were  normal.   Right Ventricle: The right ventricular size is normal. No increase in  right ventricular wall thickness. Right ventricular systolic function is  normal.   Left Atrium: Left atrial size was normal in size.   Right Atrium: Right atrial size was normal in size.   Pericardium: A small pericardial effusion is present.   Mitral Valve: The mitral valve is normal in structure. Trivial mitral  valve regurgitation.   Tricuspid Valve: The tricuspid valve is normal in structure. Tricuspid  valve regurgitation is trivial.   Aortic Valve: The aortic valve is normal in structure. Aortic valve  regurgitation is trivial. Aortic valve mean gradient measures 2.0 mmHg.  Aortic valve peak gradient measures 4.7 mmHg. Aortic valve area, by VTI  measures 2.46 cm.   Pulmonic Valve: The pulmonic valve was normal in structure. Pulmonic valve  regurgitation is not visualized.   Aorta: The aortic root and ascending  aorta are structurally normal, with  no evidence of dilitation.   IAS/Shunts: No atrial level shunt detected by color flow Doppler.  LEFT VENTRICLE  PLAX 2D  LVIDd:         2.50 cm   Diastology  LVIDs:         1.70 cm   LV e' medial:    3.37 cm/s  LV PW:         1.00 cm   LV E/e' medial:  13.4  LV IVS:        1.80 cm   LV e' lateral:   5.00 cm/s  LVOT diam:     2.00 cm   LV E/e' lateral: 9.0  LV SV:         42  LV SV Index:   33  LVOT Area:     3.14 cm      RIGHT VENTRICLE  RV S prime:     20.00 cm/s  TAPSE (M-mode): 4.8 cm   LEFT ATRIUM             Index        RIGHT ATRIUM           Index  LA diam:        4.00 cm 3.12 cm/m   RA Area:     18.50 cm  LA Vol (A2C):   42.9 ml 33.46 ml/m  RA Volume:   52.10 ml  40.63 ml/m  LA Vol (A4C):   55.8 ml 43.52 ml/m  LA Biplane Vol: 49.1 ml 38.29 ml/m   AORTIC VALVE  AV Area (Vmax):    2.16 cm  AV Area (Vmean):   2.44 cm  AV Area (VTI):     2.46 cm  AV Vmax:           108.00 cm/s  AV Vmean:          68.700 cm/s  AV VTI:            0.170 m  AV Peak Grad:      4.7 mmHg  AV Mean Grad:      2.0 mmHg  LVOT Vmax:         74.40 cm/s  LVOT Vmean:        53.300 cm/s  LVOT VTI:          0.133 m  LVOT/AV VTI ratio: 0.78   MITRAL VALVE               TRICUSPID VALVE  MV Area (PHT): 3.93 cm    TR Peak grad:   11.8 mmHg  MV Decel Time: 193 msec    TR Vmax:        172.00 cm/s  MV E velocity: 45.00 cm/s  MV A velocity: 84.80 cm/s  SHUNTS  MV E/A ratio:  0.53        Systemic VTI:  0.13 m                             Systemic Diam: 2.00 cm   Subjective: Seen and examined at bedside and she is extremely hard of hearing.  Denied any complaints at this time and felt better after the blood.  Echocardiogram done and she is evaluated by oncology who felt that she is at her baseline.  Family wanted to take her home and she is deemed stable to be discharged and follow-up with PCP, medical oncology and hematology as well as cardiology in  outpatient setting.  She has an appointment with her cardiologist Dr. Georgia Dom within the day and is  going to be tomorrow afternoon.  Discharge Exam: Vitals:   04/01/21 1200 04/01/21 1229  BP:    Pulse:    Resp:    Temp:    SpO2: 98% 100%   Vitals:   04/01/21 0756 04/01/21 1128 04/01/21 1200 04/01/21 1229  BP: (!) 179/102 (!) 109/59    Pulse: 69 71    Resp: 10 20    Temp: (!) 97.5 F (36.4 C) 98.1 F (36.7 C)    TempSrc: Oral Oral    SpO2: 99% 97% 98% 100%  Weight:      Height:       General: Pt is an extremely thin and cachectic frail-appearing female who is awake, not in acute distress Cardiovascular: RRR, S1/S2 +, no rubs, no gallops Respiratory: Diminished bilaterally, no wheezing, no rhonchi; unlabored breathing Abdominal: Soft, NT, ND, bowel sounds + Extremities: no edema, no cyanosis; has some bruising noted  The results of significant diagnostics from this hospitalization (including imaging, microbiology, ancillary and laboratory) are listed below for reference.    Microbiology: Recent Results (from the past 240 hour(s))  Resp Panel by RT-PCR (Flu A&B, Covid) Nasopharyngeal Swab     Status: None   Collection Time: 04/01/21 12:28 AM   Specimen: Nasopharyngeal Swab; Nasopharyngeal(NP) swabs in vial transport medium  Result Value Ref Range Status   SARS Coronavirus 2 by RT PCR NEGATIVE NEGATIVE Final    Comment: (NOTE) SARS-CoV-2 target nucleic acids are NOT DETECTED.  The SARS-CoV-2 RNA is generally detectable in upper respiratory specimens during the acute phase of infection. The lowest concentration of SARS-CoV-2 viral copies this assay can detect is 138 copies/mL. A negative result does not preclude SARS-Cov-2 infection and should not be used as the sole basis for treatment or other patient management decisions. A negative result may occur with  improper specimen collection/handling, submission of specimen other than nasopharyngeal swab, presence of viral  mutation(s) within the areas targeted by this assay, and inadequate number of viral copies(<138 copies/mL). A negative result must be combined with clinical observations, patient history, and epidemiological information. The expected result is Negative.  Fact Sheet for Patients:  EntrepreneurPulse.com.au  Fact Sheet for Healthcare Providers:  IncredibleEmployment.be  This test is no t yet approved or cleared by the [place] FDA and  has been authorized for detection and/or diagnosis of SARS-CoV-2 by FDA under an Emergency Use Authorization (EUA). This EUA will remain  in effect (meaning this test can be used) for the duration of the COVID-19 declaration under Section 564(b)(1) of the Act, 21 U.S.C.section 360bbb-3(b)(1), unless the authorization is terminated  or revoked sooner.       Influenza A by PCR NEGATIVE NEGATIVE Final   Influenza B by PCR NEGATIVE NEGATIVE Final    Comment: (NOTE) The Xpert Xpress SARS-CoV-2/FLU/RSV plus assay is intended as an aid in the diagnosis of influenza from Nasopharyngeal swab specimens and should not be used as a sole basis for treatment. Nasal washings and aspirates are unacceptable for Xpert Xpress SARS-CoV-2/FLU/RSV testing.  Fact Sheet for Patients: EntrepreneurPulse.com.au  Fact Sheet for Healthcare Providers: IncredibleEmployment.be  This test is not yet approved or cleared by the [place] FDA and has been authorized for detection and/or diagnosis of SARS-CoV-2 by FDA under an Emergency Use Authorization (EUA). This EUA will remain in effect (meaning this test can be used) for the duration of the COVID-19 declaration under Section 564(b)(1) of the Act, 21 U.S.C. section 360bbb-3(b)(1), unless the authorization is terminated or revoked.  Performed  at [facility], [address] [place], [place] [address]    Labs: BNP (last 3  results) No results for input(s): BNP in the last 8760 hours. Basic Metabolic Panel: Recent Labs  Lab 03/31/21 1457 04/01/21 0551  NA 137 137  K 3.6 3.7  CL 99 98  CO2 29 29  GLUCOSE 216* 217*  BUN 39* 35*  CREATININE 1.25* 1.19*  CALCIUM 8.4* 8.5*   Liver Function Tests: No results for input(s): AST, ALT, ALKPHOS, BILITOT, PROT, ALBUMIN in the last 168 hours. No results for input(s): LIPASE, AMYLASE in the last 168 hours. No results for input(s): AMMONIA in the last 168 hours. CBC: Recent Labs  Lab 03/31/21 1457 04/01/21 0551  WBC 2.4* 3.0*  HGB 7.7* 10.3*  HCT 22.3* 28.6*  MCV 126.7* 114.9*  PLT 165 173   Cardiac Enzymes: No results for input(s): CKTOTAL, CKMB, CKMBINDEX, TROPONINI in the last 168 hours. BNP: Invalid input(s): POCBNP CBG: Recent Labs  Lab 04/01/21 0011 04/01/21 1231  GLUCAP 231* 230*   D-Dimer No results for input(s): DDIMER in the last 72 hours. Hgb A1c No results for input(s): HGBA1C in the last 72 hours. Lipid Profile No results for input(s): CHOL, HDL, LDLCALC, TRIG, CHOLHDL, LDLDIRECT in the last 72 hours. Thyroid function studies No results for input(s): TSH, T4TOTAL, T3FREE, THYROIDAB in the last 72 hours.  Invalid input(s): FREET3 Anemia work up No results for input(s): VITAMINB12, FOLATE, FERRITIN, TIBC, IRON, RETICCTPCT in the last 72 hours. Urinalysis    Component Value Date/Time   COLORURINE YELLOW (A) 03/31/2021 1635   APPEARANCEUR CLEAR (A) 03/31/2021 1635   APPEARANCEUR Clear 11/10/2012 1347   LABSPEC 1.011 03/31/2021 1635   LABSPEC 1.012 11/10/2012 1347   PHURINE 5.0 03/31/2021 1635   GLUCOSEU NEGATIVE 03/31/2021 1635   GLUCOSEU Negative 11/10/2012 1347   HGBUR NEGATIVE 03/31/2021 [place] 03/31/2021 1635   BILIRUBINUR Negative 11/10/2012 [place] 03/31/2021 1635   PROTEINUR 30 (A) 03/31/2021 1635   NITRITE NEGATIVE 03/31/2021 1635   LEUKOCYTESUR NEGATIVE 03/31/2021 1635    LEUKOCYTESUR 1+ 11/10/2012 1347   Sepsis Labs Invalid input(s): PROCALCITONIN,  WBC,  LACTICIDVEN Microbiology Recent Results (from the past 240 hour(s))  Resp Panel by RT-PCR (Flu A&B, Covid) Nasopharyngeal Swab     Status: None   Collection Time: 04/01/21 12:28 AM   Specimen: Nasopharyngeal Swab; Nasopharyngeal(NP) swabs in vial transport medium  Result Value Ref Range Status   SARS Coronavirus 2 by RT PCR NEGATIVE NEGATIVE Final    Comment: (NOTE) SARS-CoV-2 target nucleic acids are NOT DETECTED.  The SARS-CoV-2 RNA is generally detectable in upper respiratory specimens during the acute phase of infection. The lowest concentration of SARS-CoV-2 viral copies this assay can detect is 138 copies/mL. A negative result does not preclude SARS-Cov-2 infection and should not be used as the sole basis for treatment or other patient management decisions. A negative result may occur with  improper specimen collection/handling, submission of specimen other than nasopharyngeal swab, presence of viral mutation(s) within the areas targeted by this assay, and inadequate number of viral copies(<138 copies/mL). A negative result must be combined with clinical observations, patient history, and epidemiological information. The expected result is Negative.  Fact Sheet for Patients:  EntrepreneurPulse.com.au  Fact Sheet for Healthcare Providers:  IncredibleEmployment.be  This test is no t yet approved or cleared by the [place] FDA and  has been authorized for detection and/or diagnosis of SARS-CoV-2 by FDA under an  Emergency Use Authorization (EUA). This EUA will remain  in effect (meaning this test can be used) for the duration of the COVID-19 declaration under Section 564(b)(1) of the Act, 21 U.S.C.section 360bbb-3(b)(1), unless the authorization is terminated  or revoked sooner.       Influenza A by PCR NEGATIVE NEGATIVE Final   Influenza B by PCR  NEGATIVE NEGATIVE Final    Comment: (NOTE) The Xpert Xpress SARS-CoV-2/FLU/RSV plus assay is intended as an aid in the diagnosis of influenza from Nasopharyngeal swab specimens and should not be used as a sole basis for treatment. Nasal washings and aspirates are unacceptable for Xpert Xpress SARS-CoV-2/FLU/RSV testing.  Fact Sheet for Patients: EntrepreneurPulse.com.au  Fact Sheet for Healthcare Providers: IncredibleEmployment.be  This test is not yet approved or cleared by the [place] FDA and has been authorized for detection and/or diagnosis of SARS-CoV-2 by FDA under an Emergency Use Authorization (EUA). This EUA will remain in effect (meaning this test can be used) for the duration of the COVID-19 declaration under Section 564(b)(1) of the Act, 21 U.S.C. section 360bbb-3(b)(1), unless the authorization is terminated or revoked.  Performed at [facility], [address], [place], [place] [address]    Time coordinating discharge: 35 minutes  SIGNED:  Kerney Elbe, DO [facility] 04/01/2021, 4:21 PM Pager is on [place]  If 7PM-7AM, please contact night-coverage [email or web address]

## 2021-04-01 NOTE — Plan of Care (Addendum)
Handoff report received from ED. Patient A/O x 2-3 with even and unlabored breathing. 4Ps and plan of care discussed. Speech hard to decode , baseline prior to admission per ED nurse. Head to toe assessment. Skin intact. No pressure injury noted. Scattered ecchymosis on BLE and BUE. Skin tear on right middle finger . Mepilex applied on site and on coccyx area. Skin assessment done with Sharlene Dory, RN.  Call light within reach. Blood transfusion x 1 on going. Continue poc/ hourly rounding. Problem: Education: Goal: Knowledge of General Education information will improve Description: Including pain rating scale, medication(s)/side effects and non-pharmacologic comfort measures Outcome: Progressing   Problem: Health Behavior/Discharge Planning: Goal: Ability to manage health-related needs will improve Outcome: Progressing   Problem: Clinical Measurements: Goal: Ability to maintain clinical measurements within normal limits will improve Outcome: Progressing Goal: Will remain free from infection Outcome: Progressing Goal: Diagnostic test results will improve Outcome: Progressing Goal: Respiratory complications will improve Outcome: Progressing Goal: Cardiovascular complication will be avoided Outcome: Progressing   Problem: Activity: Goal: Risk for activity intolerance will decrease Outcome: Progressing   Problem: Nutrition: Goal: Adequate nutrition will be maintained Outcome: Progressing   Problem: Coping: Goal: Level of anxiety will decrease Outcome: Progressing   Problem: Elimination: Goal: Will not experience complications related to bowel motility Outcome: Progressing Goal: Will not experience complications related to urinary retention Outcome: Progressing   Problem: Pain Managment: Goal: General experience of comfort will improve Outcome: Progressing   Problem: Safety: Goal: Ability to remain free from injury will improve Outcome: Progressing   Problem: Skin  Integrity: Goal: Risk for impaired skin integrity will decrease Outcome: Progressing

## 2021-04-02 LAB — PREPARE RBC (CROSSMATCH)

## 2021-04-03 LAB — TYPE AND SCREEN
ABO/RH(D): A POS
Antibody Screen: NEGATIVE
Unit division: 0

## 2021-04-03 LAB — BPAM RBC
Blood Product Expiration Date: 202210182359
ISSUE DATE / TIME: 202210110143
Unit Type and Rh: 9500

## 2021-04-10 ENCOUNTER — Inpatient Hospital Stay: Payer: Medicare PPO

## 2021-04-10 ENCOUNTER — Telehealth: Payer: Self-pay

## 2021-04-10 ENCOUNTER — Inpatient Hospital Stay: Payer: Medicare PPO | Attending: Oncology | Admitting: Oncology

## 2021-04-10 ENCOUNTER — Other Ambulatory Visit: Payer: Self-pay

## 2021-04-10 DIAGNOSIS — Z79899 Other long term (current) drug therapy: Secondary | ICD-10-CM | POA: Insufficient documentation

## 2021-04-10 DIAGNOSIS — C7A00 Malignant carcinoid tumor of unspecified site: Secondary | ICD-10-CM | POA: Diagnosis not present

## 2021-04-10 DIAGNOSIS — D649 Anemia, unspecified: Secondary | ICD-10-CM | POA: Insufficient documentation

## 2021-04-10 DIAGNOSIS — D473 Essential (hemorrhagic) thrombocythemia: Secondary | ICD-10-CM

## 2021-04-10 DIAGNOSIS — C7B02 Secondary carcinoid tumors of liver: Secondary | ICD-10-CM | POA: Insufficient documentation

## 2021-04-10 DIAGNOSIS — D45 Polycythemia vera: Secondary | ICD-10-CM

## 2021-04-10 LAB — CBC WITH DIFFERENTIAL/PLATELET
Abs Immature Granulocytes: 0.01 10*3/uL (ref 0.00–0.07)
Basophils Absolute: 0 10*3/uL (ref 0.0–0.1)
Basophils Relative: 1 %
Eosinophils Absolute: 0.1 10*3/uL (ref 0.0–0.5)
Eosinophils Relative: 2 %
HCT: 29.5 % — ABNORMAL LOW (ref 36.0–46.0)
Hemoglobin: 9.8 g/dL — ABNORMAL LOW (ref 12.0–15.0)
Immature Granulocytes: 0 %
Lymphocytes Relative: 26 %
Lymphs Abs: 0.8 10*3/uL (ref 0.7–4.0)
MCH: 41.9 pg — ABNORMAL HIGH (ref 26.0–34.0)
MCHC: 33.2 g/dL (ref 30.0–36.0)
MCV: 126.1 fL — ABNORMAL HIGH (ref 80.0–100.0)
Monocytes Absolute: 0.2 10*3/uL (ref 0.1–1.0)
Monocytes Relative: 8 %
Neutro Abs: 1.8 10*3/uL (ref 1.7–7.7)
Neutrophils Relative %: 63 %
Platelets: 508 10*3/uL — ABNORMAL HIGH (ref 150–400)
RBC: 2.34 MIL/uL — ABNORMAL LOW (ref 3.87–5.11)
RDW: 22.7 % — ABNORMAL HIGH (ref 11.5–15.5)
WBC: 2.9 10*3/uL — ABNORMAL LOW (ref 4.0–10.5)
nRBC: 0 % (ref 0.0–0.2)

## 2021-04-10 LAB — COMPREHENSIVE METABOLIC PANEL
ALT: 47 U/L — ABNORMAL HIGH (ref 0–44)
AST: 32 U/L (ref 15–41)
Albumin: 3.4 g/dL — ABNORMAL LOW (ref 3.5–5.0)
Alkaline Phosphatase: 57 U/L (ref 38–126)
Anion gap: 8 (ref 5–15)
BUN: 32 mg/dL — ABNORMAL HIGH (ref 8–23)
CO2: 30 mmol/L (ref 22–32)
Calcium: 8.4 mg/dL — ABNORMAL LOW (ref 8.9–10.3)
Chloride: 101 mmol/L (ref 98–111)
Creatinine, Ser: 1.2 mg/dL — ABNORMAL HIGH (ref 0.44–1.00)
GFR, Estimated: 44 mL/min — ABNORMAL LOW (ref >60)
Glucose, Bld: 237 mg/dL — ABNORMAL HIGH (ref 70–99)
Potassium: 3.4 mmol/L — ABNORMAL LOW (ref 3.5–5.1)
Sodium: 139 mmol/L (ref 135–145)
Total Bilirubin: 0.7 mg/dL (ref 0.3–1.2)
Total Protein: 5.9 g/dL — ABNORMAL LOW (ref 6.5–8.1)

## 2021-04-10 MED ORDER — OCTREOTIDE ACETATE 30 MG IM KIT
30.0000 mg | PACK | INTRAMUSCULAR | Status: DC
  Administered 2021-04-10: 30 mg via INTRAMUSCULAR
  Filled 2021-04-10: qty 1

## 2021-04-10 NOTE — Progress Notes (Signed)
Called both contact numbers, no answer. Left message, will call again this evening/ tomorrow

## 2021-04-10 NOTE — Telephone Encounter (Signed)
Opened in error

## 2021-04-11 NOTE — Progress Notes (Signed)
Per caregiver Collie Siad, patient is not taking Hydrea in over a week.

## 2021-04-22 ENCOUNTER — Other Ambulatory Visit: Payer: Self-pay

## 2021-04-22 ENCOUNTER — Inpatient Hospital Stay
Admission: EM | Admit: 2021-04-22 | Discharge: 2021-05-22 | DRG: 871 | Disposition: E | Payer: Medicare PPO | Attending: Internal Medicine | Admitting: Internal Medicine

## 2021-04-22 ENCOUNTER — Encounter: Payer: Self-pay | Admitting: Emergency Medicine

## 2021-04-22 ENCOUNTER — Emergency Department: Payer: Medicare PPO

## 2021-04-22 DIAGNOSIS — G928 Other toxic encephalopathy: Secondary | ICD-10-CM | POA: Diagnosis present

## 2021-04-22 DIAGNOSIS — R64 Cachexia: Secondary | ICD-10-CM | POA: Diagnosis present

## 2021-04-22 DIAGNOSIS — Z515 Encounter for palliative care: Secondary | ICD-10-CM

## 2021-04-22 DIAGNOSIS — Z7902 Long term (current) use of antithrombotics/antiplatelets: Secondary | ICD-10-CM

## 2021-04-22 DIAGNOSIS — R54 Age-related physical debility: Secondary | ICD-10-CM | POA: Diagnosis present

## 2021-04-22 DIAGNOSIS — R5381 Other malaise: Secondary | ICD-10-CM | POA: Diagnosis present

## 2021-04-22 DIAGNOSIS — Z8673 Personal history of transient ischemic attack (TIA), and cerebral infarction without residual deficits: Secondary | ICD-10-CM

## 2021-04-22 DIAGNOSIS — Z85038 Personal history of other malignant neoplasm of large intestine: Secondary | ICD-10-CM

## 2021-04-22 DIAGNOSIS — E1151 Type 2 diabetes mellitus with diabetic peripheral angiopathy without gangrene: Secondary | ICD-10-CM | POA: Diagnosis present

## 2021-04-22 DIAGNOSIS — Z853 Personal history of malignant neoplasm of breast: Secondary | ICD-10-CM | POA: Diagnosis not present

## 2021-04-22 DIAGNOSIS — R4182 Altered mental status, unspecified: Secondary | ICD-10-CM | POA: Diagnosis present

## 2021-04-22 DIAGNOSIS — Z8542 Personal history of malignant neoplasm of other parts of uterus: Secondary | ICD-10-CM

## 2021-04-22 DIAGNOSIS — Z79899 Other long term (current) drug therapy: Secondary | ICD-10-CM

## 2021-04-22 DIAGNOSIS — Z8249 Family history of ischemic heart disease and other diseases of the circulatory system: Secondary | ICD-10-CM

## 2021-04-22 DIAGNOSIS — J9601 Acute respiratory failure with hypoxia: Secondary | ICD-10-CM | POA: Diagnosis present

## 2021-04-22 DIAGNOSIS — Z806 Family history of leukemia: Secondary | ICD-10-CM

## 2021-04-22 DIAGNOSIS — Z20822 Contact with and (suspected) exposure to covid-19: Secondary | ICD-10-CM | POA: Diagnosis present

## 2021-04-22 DIAGNOSIS — F0394 Unspecified dementia, unspecified severity, with anxiety: Secondary | ICD-10-CM | POA: Diagnosis present

## 2021-04-22 DIAGNOSIS — A419 Sepsis, unspecified organism: Secondary | ICD-10-CM | POA: Diagnosis not present

## 2021-04-22 DIAGNOSIS — E875 Hyperkalemia: Secondary | ICD-10-CM | POA: Diagnosis present

## 2021-04-22 DIAGNOSIS — I614 Nontraumatic intracerebral hemorrhage in cerebellum: Secondary | ICD-10-CM | POA: Diagnosis present

## 2021-04-22 DIAGNOSIS — Z66 Do not resuscitate: Secondary | ICD-10-CM | POA: Diagnosis present

## 2021-04-22 DIAGNOSIS — Z85828 Personal history of other malignant neoplasm of skin: Secondary | ICD-10-CM | POA: Diagnosis not present

## 2021-04-22 DIAGNOSIS — E872 Acidosis, unspecified: Secondary | ICD-10-CM | POA: Diagnosis present

## 2021-04-22 DIAGNOSIS — Z803 Family history of malignant neoplasm of breast: Secondary | ICD-10-CM

## 2021-04-22 DIAGNOSIS — Z90710 Acquired absence of both cervix and uterus: Secondary | ICD-10-CM

## 2021-04-22 DIAGNOSIS — E119 Type 2 diabetes mellitus without complications: Secondary | ICD-10-CM

## 2021-04-22 DIAGNOSIS — I252 Old myocardial infarction: Secondary | ICD-10-CM

## 2021-04-22 DIAGNOSIS — I25118 Atherosclerotic heart disease of native coronary artery with other forms of angina pectoris: Secondary | ICD-10-CM | POA: Diagnosis not present

## 2021-04-22 DIAGNOSIS — Z681 Body mass index (BMI) 19 or less, adult: Secondary | ICD-10-CM

## 2021-04-22 DIAGNOSIS — J690 Pneumonitis due to inhalation of food and vomit: Secondary | ICD-10-CM | POA: Diagnosis present

## 2021-04-22 DIAGNOSIS — R627 Adult failure to thrive: Secondary | ICD-10-CM | POA: Diagnosis present

## 2021-04-22 DIAGNOSIS — Z833 Family history of diabetes mellitus: Secondary | ICD-10-CM

## 2021-04-22 DIAGNOSIS — I10 Essential (primary) hypertension: Secondary | ICD-10-CM | POA: Diagnosis present

## 2021-04-22 DIAGNOSIS — Z87891 Personal history of nicotine dependence: Secondary | ICD-10-CM

## 2021-04-22 DIAGNOSIS — I2510 Atherosclerotic heart disease of native coronary artery without angina pectoris: Secondary | ICD-10-CM | POA: Diagnosis present

## 2021-04-22 DIAGNOSIS — Z794 Long term (current) use of insulin: Secondary | ICD-10-CM

## 2021-04-22 LAB — PROCALCITONIN: Procalcitonin: 1.21 ng/mL

## 2021-04-22 LAB — COMPREHENSIVE METABOLIC PANEL
ALT: 37 U/L (ref 0–44)
AST: 38 U/L (ref 15–41)
Albumin: 3.7 g/dL (ref 3.5–5.0)
Alkaline Phosphatase: 81 U/L (ref 38–126)
Anion gap: 12 (ref 5–15)
BUN: 33 mg/dL — ABNORMAL HIGH (ref 8–23)
CO2: 30 mmol/L (ref 22–32)
Calcium: 9.5 mg/dL (ref 8.9–10.3)
Chloride: 99 mmol/L (ref 98–111)
Creatinine, Ser: 1.23 mg/dL — ABNORMAL HIGH (ref 0.44–1.00)
GFR, Estimated: 43 mL/min — ABNORMAL LOW (ref >60)
Glucose, Bld: 75 mg/dL (ref 70–99)
Potassium: 4.5 mmol/L (ref 3.5–5.1)
Sodium: 141 mmol/L (ref 135–145)
Total Bilirubin: 1.5 mg/dL — ABNORMAL HIGH (ref 0.3–1.2)
Total Protein: 6.5 g/dL (ref 6.5–8.1)

## 2021-04-22 LAB — APTT: aPTT: 37 seconds — ABNORMAL HIGH (ref 24–36)

## 2021-04-22 LAB — PROTIME-INR
INR: 1.4 — ABNORMAL HIGH (ref 0.8–1.2)
Prothrombin Time: 17.1 seconds — ABNORMAL HIGH (ref 11.4–15.2)

## 2021-04-22 LAB — CBC WITH DIFFERENTIAL/PLATELET
Abs Immature Granulocytes: 0.34 10*3/uL — ABNORMAL HIGH (ref 0.00–0.07)
Basophils Absolute: 0.1 10*3/uL (ref 0.0–0.1)
Basophils Relative: 1 %
Eosinophils Absolute: 0 10*3/uL (ref 0.0–0.5)
Eosinophils Relative: 0 %
HCT: 38.9 % (ref 36.0–46.0)
Hemoglobin: 13 g/dL (ref 12.0–15.0)
Immature Granulocytes: 2 %
Lymphocytes Relative: 4 %
Lymphs Abs: 0.8 10*3/uL (ref 0.7–4.0)
MCH: 40.1 pg — ABNORMAL HIGH (ref 26.0–34.0)
MCHC: 33.4 g/dL (ref 30.0–36.0)
MCV: 120.1 fL — ABNORMAL HIGH (ref 80.0–100.0)
Monocytes Absolute: 1.7 10*3/uL — ABNORMAL HIGH (ref 0.1–1.0)
Monocytes Relative: 9 %
Neutro Abs: 16 10*3/uL — ABNORMAL HIGH (ref 1.7–7.7)
Neutrophils Relative %: 84 %
Platelets: 1154 10*3/uL (ref 150–400)
RBC: 3.24 MIL/uL — ABNORMAL LOW (ref 3.87–5.11)
RDW: 18.6 % — ABNORMAL HIGH (ref 11.5–15.5)
Smear Review: INCREASED
WBC: 18.9 10*3/uL — ABNORMAL HIGH (ref 4.0–10.5)
nRBC: 0.5 % — ABNORMAL HIGH (ref 0.0–0.2)

## 2021-04-22 LAB — BLOOD GAS, VENOUS
Acid-Base Excess: 3.3 mmol/L — ABNORMAL HIGH (ref 0.0–2.0)
Bicarbonate: 29.8 mmol/L — ABNORMAL HIGH (ref 20.0–28.0)
O2 Saturation: 82.1 %
Patient temperature: 37
pCO2, Ven: 54 mmHg (ref 44.0–60.0)
pH, Ven: 7.35 (ref 7.250–7.430)
pO2, Ven: 49 mmHg — ABNORMAL HIGH (ref 32.0–45.0)

## 2021-04-22 LAB — LACTIC ACID, PLASMA
Lactic Acid, Venous: 2.3 mmol/L (ref 0.5–1.9)
Lactic Acid, Venous: 1.7 mmol/L (ref 0.5–1.9)

## 2021-04-22 LAB — RESP PANEL BY RT-PCR (FLU A&B, COVID) ARPGX2
Influenza A by PCR: NEGATIVE
Influenza B by PCR: NEGATIVE
SARS Coronavirus 2 by RT PCR: NEGATIVE

## 2021-04-22 MED ORDER — SODIUM CHLORIDE 0.9 % IV SOLN: INTRAVENOUS | Status: DC

## 2021-04-22 MED ORDER — SODIUM CHLORIDE 0.9 % IV SOLN
500.0000 mg | Freq: Once | INTRAVENOUS | Status: AC
  Administered 2021-04-22: 500 mg via INTRAVENOUS
  Filled 2021-04-22: qty 500

## 2021-04-22 MED ORDER — HALOPERIDOL LACTATE 5 MG/ML IJ SOLN
1.0000 mg | Freq: Once | INTRAMUSCULAR | Status: AC
  Administered 2021-04-22: 1 mg via INTRAVENOUS
  Filled 2021-04-22: qty 1

## 2021-04-22 MED ORDER — SODIUM CHLORIDE 0.9 % IV SOLN
2.0000 g | Freq: Once | INTRAVENOUS | Status: AC
  Administered 2021-04-22: 2 g via INTRAVENOUS
  Filled 2021-04-22: qty 2

## 2021-04-22 MED ORDER — SODIUM CHLORIDE 0.9 % IV SOLN: 500.0000 mg | Freq: Once | INTRAVENOUS | Status: DC

## 2021-04-22 MED ORDER — LORAZEPAM 2 MG/ML PO CONC
1.0000 mg | ORAL | Status: DC | PRN
  Filled 2021-04-22: qty 0.5

## 2021-04-22 MED ORDER — SODIUM CHLORIDE 0.9 % IV BOLUS
500.0000 mL | Freq: Once | INTRAVENOUS | Status: AC
  Administered 2021-04-22: 500 mL via INTRAVENOUS

## 2021-04-22 MED ORDER — LACTATED RINGERS IV SOLN: INTRAVENOUS | Status: DC

## 2021-04-22 MED ORDER — HEPARIN SODIUM (PORCINE) 5000 UNIT/ML IJ SOLN: 5000.0000 [IU] | Freq: Three times a day (TID) | INTRAMUSCULAR | Status: DC

## 2021-04-22 MED ORDER — SODIUM CHLORIDE 0.9 % IV SOLN: 2.0000 g | Freq: Once | INTRAVENOUS | Status: DC

## 2021-04-22 MED ORDER — LORAZEPAM 2 MG/ML IJ SOLN
1.0000 mg | INTRAMUSCULAR | Status: DC | PRN
  Administered 2021-04-22 – 2021-04-23 (×3): 1 mg via INTRAVENOUS
  Filled 2021-04-22 (×3): qty 1

## 2021-04-22 MED ORDER — RISPERIDONE 0.5 MG PO TBDP
0.5000 mg | ORAL_TABLET | Freq: Two times a day (BID) | ORAL | Status: DC
  Administered 2021-04-22: 0.5 mg via ORAL
  Filled 2021-04-22: qty 1

## 2021-04-22 MED ORDER — VANCOMYCIN HCL IN DEXTROSE 1-5 GM/200ML-% IV SOLN
1000.0000 mg | Freq: Once | INTRAVENOUS | Status: AC
  Administered 2021-04-22: 1000 mg via INTRAVENOUS
  Filled 2021-04-22: qty 200

## 2021-04-22 MED ORDER — LORAZEPAM 1 MG PO TABS: 1.0000 mg | ORAL_TABLET | ORAL | Status: DC | PRN

## 2021-04-22 MED ORDER — HEPARIN SODIUM (PORCINE) 5000 UNIT/ML IJ SOLN
5000.0000 [IU] | Freq: Two times a day (BID) | INTRAMUSCULAR | Status: DC
  Administered 2021-04-23: 5000 [IU] via SUBCUTANEOUS
  Filled 2021-04-22: qty 1

## 2021-04-22 NOTE — ED Notes (Signed)
Pt continues to be very agitated despite IV haldol administration.  Requested EDP try something else for agitation.  Awaiting further orders.

## 2021-04-22 NOTE — ED Triage Notes (Signed)
Pt to ED via EMS from home c/o SOB.  Pt has full time care giver that stated to EMS pt went to bed at 9pm and had to be woken up at 1pm today and when she did was gasping for breath.  EMS arrived pt was 88% RA and placed on 15L [place].  Pt arrives responsive to pain, will moan but not answering questions.  Pt placed on 4L [place] and sats up to 99%.

## 2021-04-22 NOTE — H&P (Signed)
History and Physical    [name] [id number] DOB: 08-07-1931 DOA: 05/21/2021  PCP: Baxter Hire, MD    Patient coming from:  Home    Chief Complaint:  AMS.   HPI: [name] is a 87 y.o. female seen in ed with complaints of AMS and sob.  Patient was said to be awake from her shortness of breath and cough and when O2 sats were checked by EMS it was 88%.  Patient was initially on nonrebreather and was agitated and combative.     Pt has past medical history of      ED Course:  Vitals:   05/04/2021 1904 05/13/2021 2138 05/15/2021 2300 05/17/2021 2330  BP:  (!) 154/60 107/61 (!) 114/56  Pulse:  80 72 72  Resp:  (!) 24 20 20   Temp:      TempSrc:      SpO2: 99% 95% 92% 91%  Weight:          Review of Systems:  Review of Systems  Unable to perform ROS: Patient nonverbal    Past Medical History:  Diagnosis Date   Adenocarcinoma in situ of cervix    Arthritis    hands   Breast cancer ([place])    Colon cancer ([place])    Diabetes mellitus without complication ([place])    Endometrial carcinoma ([place])    s/p total abdominal hysterectomy   H/O compression fracture of spine 2014   thoracic spine   H/O polycythemia vera    H/O TIA (transient ischemic attack) and stroke 09/2014, 03/2015   No deficits   Hypertension    Hyperuricemia    Microalbuminuria    Myocardial infarction ([place])    Polycythemia vera ([place])    Recurrent falls    Skin cancer    face, legs   Stroke ([place]) 2008   no deficits   Trochanteric bursitis    Varicose veins    treated    Past Surgical History:  Procedure Laterality Date   ABDOMINAL HYSTERECTOMY     BOWEL RESECTION N/A 03/28/2015   Procedure: SMALL BOWEL RESECTION;  Surgeon: Leonie Green, MD;  Location: [facility];  Service: General;  Laterality: N/A;   CATARACT EXTRACTION W/ INTRAOCULAR LENS IMPLANT Right    CATARACT EXTRACTION W/PHACO Left 10/07/2015   Procedure: CATARACT EXTRACTION PHACO AND INTRAOCULAR LENS PLACEMENT ([place]);   Surgeon: Ronnell Freshwater, MD;  Location: [facility];  Service: Ophthalmology;  Laterality: Left;  DIABETIC - oral meds VISION BLUE   CORONARY ANGIOGRAPHY N/A 10/29/2017   Procedure: CORONARY ANGIOGRAPHY;  Surgeon: Dionisio David, MD;  Location: [facility];  Service: Cardiovascular;  Laterality: N/A;   EXPLORATORY LAPAROTOMY     for fibroids   HIP ARTHROPLASTY Left 12/01/2018   Procedure: ARTHROPLASTY BIPOLAR HIP (HEMIARTHROPLASTY);  Surgeon: Corky Mull, MD;  Location: [facility];  Service: Orthopedics;  Laterality: Left;   LEFT HEART CATH Right 10/29/2017   Procedure: Left Heart Cath;  Surgeon: Dionisio David, MD;  Location: [facility];  Service: Cardiovascular;  Laterality: Right;   TONSILLECTOMY       reports that she has quit smoking. She has never used smokeless tobacco. She reports that she does not drink alcohol and does not use drugs.  Allergies  Allergen Reactions   Other Other (See Comments)    ALL OPIOIDS    Simvastatin Other (See Comments)    Myalgia    Family History  Problem Relation Age of  Onset   Cancer Brother        AML   Heart attack Mother    Breast cancer Mother    Heart attack Father    Diabetes Father    Heart attack Sister    Diabetes Sister     Prior to Admission medications   Medication Sig Start Date End Date Taking? Authorizing Provider  acetaminophen (TYLENOL) 325 MG tablet Take 1-2 tablets (325-650 mg total) by mouth every 6 (six) hours as needed for mild pain (pain score 1-3 or temp > 100.5). Patient taking differently: Take 650 mg by mouth 3 (three) times daily. 12/04/18   Duanne Guess, PA-C  amLODipine (NORVASC) 2.5 MG tablet Take 5 mg by mouth daily. 02/03/21   [provider]  calcium-vitamin D (OSCAL WITH D) 500-200 MG-UNIT per tablet Take 1 tablet by mouth daily.     [provider]  carvedilol (COREG) 25 MG tablet Take 1 tablet (25 mg total) by mouth 2 (two) times daily with a  meal. Patient taking differently: Take 12.5 mg by mouth 2 (two) times daily with a meal. 11/16/17   Angiulli, Lavon Paganini, PA-C  clopidogrel (PLAVIX) 75 MG tablet TAKE 1 TABLET BY MOUTH DAILY 11/02/19   Frann Rider, NP  Docusate Sodium (STOOL SOFTENER LAXATIVE PO) Take 1 capsule by mouth daily.    [provider]  fexofenadine (ALLEGRA ALLERGY) 180 MG tablet Take 1 tablet (180 mg total) by mouth daily. 12/17/17   Frann Rider, NP  fluticasone (FLONASE) 50 MCG/ACT nasal spray Place 2 sprays into the nose daily.    [provider]  furosemide (LASIX) 20 MG tablet Take 20 mg by mouth daily. 07/21/20   [provider]  hydroxyurea (HYDREA) 500 MG capsule Take 500mg  on Monday, Wednesday,Friday and Sunday.Take 1000mg  on Tuesday, Thursday and Saturdays. May take with food to minimize GI side effects. Hold until seen by Dr. Janese Banks on 04/10/21 04/10/21   Raiford Noble Latif, DO  insulin detemir (LEVEMIR) 100 UNIT/ML injection Inject 0.06 mLs (6 Units total) into the skin at bedtime. 04/02/21   Raiford Noble Latif, DO  octreotide (SANDOSTATIN LAR) 10 MG injection Inject 10 mg into the muscle every 28 (twenty-eight) days.    [provider]  pantoprazole (PROTONIX) 40 MG tablet Take 1 tablet (40 mg total) by mouth daily. 11/17/17   Angiulli, Lavon Paganini, PA-C  pravastatin (PRAVACHOL) 20 MG tablet Take 1 tablet (20 mg total) by mouth at bedtime. 11/16/17   Angiulli, Lavon Paganini, PA-C  QUEtiapine (SEROQUEL) 50 MG tablet Take 50 mg by mouth at bedtime.     [provider]  ramipril (ALTACE) 5 MG capsule Take 5 mg by mouth 2 (two) times daily.    [provider]  sertraline (ZOLOFT) 50 MG tablet Take 1 tablet (50 mg total) by mouth daily. 07/05/19   Frann Rider, NP  spironolactone (ALDACTONE) 25 MG tablet Take 0.5 tablets (12.5 mg total) by mouth every other day for 30 days. Patient taking differently: Take 12.5 mg by mouth See admin instructions. Take 12.5 mg by mouth  twice daily every other day 12/04/18 04/01/21  Saundra Shelling, MD  Telotristat Ethyl,as Etiprate, (XERMELO) 250 MG TABS TAKE 1 TABLET (250 MG) BY MOUTH 3 (THREE) TIMES DAILY WITH MEALS. 03/28/21   Verlon Au, NP  traMADol (ULTRAM) 50 MG tablet 25 mg every other day.  08/09/19   [provider]    Physical Exam: Vitals:   04/24/2021 1904 05/05/2021 2138  05/10/2021 2300 05/07/2021 2330  BP:  (!) 154/60 107/61 (!) 114/56  Pulse:  80 72 72  Resp:  (!) 24 20 20   Temp:      TempSrc:      SpO2: 99% 95% 92% 91%  Weight:       Physical Exam Constitutional:      General: She is sleeping.     Appearance: She is ill-appearing.     Interventions: She is sedated. Face mask in place.  HENT:     Head: Normocephalic and atraumatic.     Right Ear: External ear normal.     Left Ear: External ear normal.     Mouth/Throat:     Mouth: Mucous membranes are moist.  Eyes:     Extraocular Movements: Extraocular movements intact.     Pupils: Pupils are equal, round, and reactive to light.  Cardiovascular:     Rate and Rhythm: Normal rate and regular rhythm.     Pulses: Normal pulses.     Heart sounds: Normal heart sounds.  Pulmonary:     Effort: Pulmonary effort is normal.     Breath sounds: Rhonchi present.  Abdominal:     General: Bowel sounds are normal. There is no distension.     Palpations: Abdomen is soft.     Tenderness: There is no abdominal tenderness. There is no guarding.  Musculoskeletal:     Right lower leg: No edema.     Left lower leg: No edema.  Skin:    General: Skin is warm.  Neurological:     General: No focal deficit present.     Mental Status: She is lethargic and disoriented.     Cranial Nerves: No cranial nerve deficit.     Motor: No weakness, atrophy or seizure activity.  Psychiatric:        Behavior: Behavior is uncooperative.    Labs on Admission: I have personally reviewed following labs and imaging studies  No results for input(s): CKTOTAL, CKMB,  TROPONINI in the last 72 hours. Lab Results  Component Value Date   WBC 18.9 (H) 04/29/2021   HGB 13.0 05/20/2021   HCT 38.9 05/17/2021   MCV 120.1 (H) 05/05/2021   PLT 1,154 (HH) 04/27/2021    Recent Labs  Lab 05/02/2021 1955  NA 141  K 4.5  CL 99  CO2 30  BUN 33*  CREATININE 1.23*  CALCIUM 9.5  PROT 6.5  BILITOT 1.5*  ALKPHOS 81  ALT 37  AST 38  GLUCOSE 75   Lab Results  Component Value Date   CHOL 148 12/20/2014   HDL 44 12/20/2014   LDLCALC 78 12/20/2014   TRIG 130 12/20/2014   No results found for: DDIMER Invalid input(s): POCBNP  Urinalysis    Component Value Date/Time   COLORURINE YELLOW (A) 03/31/2021 1635   APPEARANCEUR CLEAR (A) 03/31/2021 1635   APPEARANCEUR Clear 11/10/2012 1347   LABSPEC 1.011 03/31/2021 1635   LABSPEC 1.012 11/10/2012 1347   PHURINE 5.0 03/31/2021 [place] 03/31/2021 1635   GLUCOSEU Negative 11/10/2012 [place] 03/31/2021 [place] 03/31/2021 1635   BILIRUBINUR Negative 11/10/2012 [place] 03/31/2021 1635   PROTEINUR 30 (A) 03/31/2021 1635   NITRITE NEGATIVE 03/31/2021 1635   LEUKOCYTESUR NEGATIVE 03/31/2021 1635   LEUKOCYTESUR 1+ 11/10/2012 1347    COVID-19 Labs No results for input(s): DDIMER, FERRITIN, LDH, CRP in the last 72 hours. Lab Results  Component Value Date  [place] NEGATIVE 05/02/2021   [place] NEGATIVE 04/01/2021   [place] NEGATIVE 07/18/2019   [place] NEGATIVE 11/30/2018    Radiological Exams on Admission: DG Chest Port 1 View  Result Date: 04/28/2021 CLINICAL DATA:  Questionable sepsis. EXAM: PORTABLE CHEST 1 VIEW COMPARISON:  Chest radiograph dated 07/18/2019. FINDINGS: Cardiomegaly with vascular congestion and edema. Bibasilar atelectasis or infiltrate. No pleural effusion pneumothorax. Atherosclerotic calcification of the aorta. No acute osseous pathology. IMPRESSION: 1. Cardiomegaly with vascular congestion and edema.  2. Bibasilar atelectasis or infiltrate. Electronically Signed   By: Anner Crete M.D.   On: 05/09/2021 19:04    EKG: Independently reviewed.  Sr 84.   Echocardiogram 04/01/2021 : Left ventricular ejection fraction, by estimation, is 65 to 70%. The left ventricle has normal function. The left ventricle has no regional wall motion abnormalities. Left ventricular diastolic parameters were normal. 1. 2. Right ventricular systolic function is normal. The right ventricular size is normal. 3. A small pericardial effusion is present. 4. The mitral valve is normal in structure. Trivial mitral valve regurgitation. 5. The aortic valve is normal in structure. Aortic valve regurgitation is trivial.   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  And understands that if she were to make her comfort care that does not mean no treatment. Supportive care with supplemental oxygen as needed. Will obtain a CT angio of the chest as patient's likelihood of having a VTE is high and is in respiratory failure.  Discussed with niece that patient needs sepsis criteria has a score of 5 with a white count lactic acid respiratory failure requiring oxygen, however I am not 100 % sure also if she does have VTE she is poor candidate for anticoagulation due to her h/o ICH. This history needs to be verified if VTE is positive.   History of right-sided nontraumatic intracerebral hemorrhage of the cerebrum:  Spoke to niece at bedside that if indeed she did have a large  intracerebral hemorrhage in the past then I would not feel comfortable anticoagulating her however the for step is to identify any blood clots that she may have or if she would need treatment.  Hypertension: Blood pressure (!) 114/56, pulse 72, temperature 97.6 F (36.4 C), temperature source Oral, resp. rate 20, weight 42.8 kg, SpO2 91 %. Currently due to altered mental status I have held home medications.  His CT angio was negative then please resume all her home medications. .  Hydralazine.  As needed.  DVT prophylaxis:  SCDs  Code Status:  DNR  Family Communication:  Ree Edman (POA) (Niece)  [phone] (Mobile)   Disposition Plan:  To be determined  Consults called:  None  Admission status: Inpatient   Para Skeans MD [facility] [phone] How to contact the [facility] Attending or Consulting provider [place] or covering provider during after hours [place], for this patient.    Check the care team in [facility] and look for a) attending/consulting [facility] provider listed and b) the [facility] team listed Log into [email or web address] and use [place]'s universal password to access. If you do not have  the password, please contact the hospital operator. Locate the [facility] provider you are looking for under [facility] and page to a number that you can be directly reached. If you still have difficulty reaching the provider, please page the [facility] (Director on Call) for the Hospitalists listed on amion for assistance. [email or web address] Password [facility] 04/23/2021, 12:33 AM

## 2021-04-22 NOTE — Progress Notes (Signed)
Pharmacy Antibiotic Note  [name] is a 87 y.o. female admitted on 05/14/2021 with infection from unknown source.  Pharmacy has been consulted for Vancomycin dosing for broad spectrum IV abx.  Plan: Pt given initial dose of Vancomycin 1 gm. Vancomycin 750 mg IV Q 48 hrs.  Goal AUC 400-550. Expected AUC: 541.5 SCr used: 1.23, TBW 42.8 kg < IBW 45.5 kg  Pharmacy will continue to follow and adjust abx dosing when warranted.   Weight: 42.8 kg (94 lb 5.7 oz)  Temp (24hrs), Avg:97.6 F (36.4 C), Min:97.6 F (36.4 C), Max:97.6 F (36.4 C)  Recent Labs  Lab 05/17/2021 1955 04/23/2021 2249  WBC 18.9*  --   CREATININE 1.23*  --   LATICACIDVEN 2.3* 1.7    Estimated Creatinine Clearance: 21.8 mL/min (A) (by C-G formula based on SCr of 1.23 mg/dL (H)).    Allergies  Allergen Reactions   Other Other (See Comments)    ALL OPIOIDS    Simvastatin Other (See Comments)    Myalgia    Antimicrobials this admission: 11/1 Azithromycin >> x 1 11/1 Cefepime >> x 1 11/2 Vancomycin >>  Microbiology results: 11/01 BCx: Pending  Thank you for allowing pharmacy to be a part of this patient's care.  Renda Rolls, PharmD, [facility] 04/23/2021 12:52 AM

## 2021-04-22 NOTE — ED Provider Notes (Signed)
[facility] Emergency Department Provider Note    Event Date/Time   First MD Initiated Contact with Patient 05/13/2021 1837     (approximate)  I have reviewed the triage vital signs and the nursing notes.   HISTORY  Chief Complaint Shortness of Breath  Level v Caveat:  AMS  HPI [name] is a 87 y.o. female below listed past medical history presents to the ER for evaluation of altered mental status and shortness of breath.  Coming from home with full-time caregiver states that patient went to bed last night around 9 and had to be woken about 1 when she did was complaining shortness of breath productive cough poorly found by EMS with room air sats of 88% was placed on nonrebreather.  Patient was uncooperative with EMS.  No additional history provided.   Past Medical History:  Diagnosis Date   Adenocarcinoma in situ of cervix    Arthritis    hands   Breast cancer ([place])    Colon cancer ([place])    Diabetes mellitus without complication ([place])    Endometrial carcinoma ([place])    s/p total abdominal hysterectomy   H/O compression fracture of spine 2014   thoracic spine   H/O polycythemia vera    H/O TIA (transient ischemic attack) and stroke 09/2014, 03/2015   No deficits   Hypertension    Hyperuricemia    Microalbuminuria    Myocardial infarction ([place])    Polycythemia vera ([place])    Recurrent falls    Skin cancer    face, legs   Stroke ([place]) 2008   no deficits   Trochanteric bursitis    Varicose veins    treated   Family History  Problem Relation Age of Onset   Cancer Brother        AML   Heart attack Mother    Breast cancer Mother    Heart attack Father    Diabetes Father    Heart attack Sister    Diabetes Sister    Past Surgical History:  Procedure Laterality Date   ABDOMINAL HYSTERECTOMY     BOWEL RESECTION N/A 03/28/2015   Procedure: SMALL BOWEL RESECTION;  Surgeon: Leonie Green, MD;  Location: [facility] ORS;  Service: General;   Laterality: N/A;   CATARACT EXTRACTION W/ INTRAOCULAR LENS IMPLANT Right    CATARACT EXTRACTION W/PHACO Left 10/07/2015   Procedure: CATARACT EXTRACTION PHACO AND INTRAOCULAR LENS PLACEMENT ([place]);  Surgeon: Ronnell Freshwater, MD;  Location: [facility];  Service: Ophthalmology;  Laterality: Left;  DIABETIC - oral meds VISION BLUE   CORONARY ANGIOGRAPHY N/A 10/29/2017   Procedure: CORONARY ANGIOGRAPHY;  Surgeon: Dionisio David, MD;  Location: [facility];  Service: Cardiovascular;  Laterality: N/A;   EXPLORATORY LAPAROTOMY     for fibroids   HIP ARTHROPLASTY Left 12/01/2018   Procedure: ARTHROPLASTY BIPOLAR HIP (HEMIARTHROPLASTY);  Surgeon: Corky Mull, MD;  Location: [facility] OR[facility];  Service: Orthopedics;  Laterality: Left;   LEFT HEART CATH Right 10/29/2017   Procedure: Left Heart Cath;  Surgeon: Dionisio David, MD;  Location: [facility];  Service: Cardiovascular;  Laterality: Right;   TONSILLECTOMY     Patient Active Problem List   Diagnosis Date Noted   Near syncope 04/01/2021   Leg pain 04/05/2020   PAD (peripheral artery disease) ([place]) 04/05/2020   Palliative care encounter    B12 deficiency    Pancytopenia ([place]) 07/18/2019   Status post hip hemiarthroplasty 12/02/2018  Closed left hip fracture ([place]) 11/30/2018   CRF (chronic renal failure), stage 2 (mild) 10/28/2018   Liver lesion 04/22/2018   Goals of care, counseling/discussion 04/22/2018   Cognitive deficit, post-stroke 12/31/2017   Ataxia, post-stroke 12/31/2017   Intracranial hemorrhage ([employer]) 12/03/2017   Dysarthria, post-stroke    Gait disturbance, post-stroke    H/O cerebral parenchymal hemorrhage 11/04/2017   Essential hypertension 11/04/2017   Hyperlipidemia 11/04/2017   Diabetes ([place]) 11/04/2017   CAD (coronary artery disease) 11/04/2017   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   Hypokalemia 11/04/2017   Right-sided nontraumatic intracerebral hemorrhage of cerebellum ([place])    History of  cervical cancer    History of TIA (transient ischemic attack)    Acute systolic congestive heart failure (HCC)    Reactive hypertension    Hypernatremia    Leukocytosis    Acute blood loss anemia    Elevated serum creatinine    Acute respiratory failure with hypoxia (HCC)    IVH (intraventricular hemorrhage) ([place]) 10/29/2017   Hypoxia 10/28/2017   Depression, prolonged 09/02/2017   Pancreatic lesion 05/24/2017   Osteoarthritis of both knees 08/31/2016   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   Nodule of upper lobe of left lung 06/04/2015   Dizziness 04/15/2015   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 Authorizing Provider  acetaminophen (TYLENOL) 325 MG tablet Take 1-2 tablets (325-650 mg total) by mouth every 6 (six) hours as needed for mild pain (pain score 1-3 or temp > 100.5). Patient taking differently: Take 650 mg by mouth 3 (three) times daily. 12/04/18   Duanne Guess, PA-C  amLODipine (NORVASC) 2.5 MG tablet Take 5 mg by mouth daily. 02/03/21   [provider]  calcium-vitamin D (OSCAL WITH D) 500-200 MG-UNIT per tablet Take 1 tablet by mouth daily.     [provider]  carvedilol (COREG) 25 MG tablet Take 1 tablet (25 mg total) by mouth 2 (two) times daily with a meal. Patient taking differently: Take 12.5 mg by mouth 2 (two) times daily with a meal. 11/16/17   Angiulli, Lavon Paganini, PA-C  clopidogrel (PLAVIX) 75 MG tablet TAKE 1 TABLET BY MOUTH DAILY  11/02/19   Frann Rider, NP  Docusate Sodium (STOOL SOFTENER LAXATIVE PO) Take 1 capsule by mouth daily.    [provider]  fexofenadine (ALLEGRA ALLERGY) 180 MG tablet Take 1 tablet (180 mg total) by mouth daily. 12/17/17   Frann Rider, NP  fluticasone (FLONASE) 50 MCG/ACT nasal spray Place 2 sprays into the nose daily.    [provider]  furosemide (LASIX) 20 MG tablet Take 20 mg by mouth daily. 07/21/20   [provider]  hydroxyurea (HYDREA) 500 MG capsule Take 500mg  on Monday, Wednesday,Friday and Sunday.Take 1000mg  on Tuesday, Thursday and Saturdays. May take with food to minimize GI side effects. Hold until seen by Dr. Janese Banks on 04/10/21 04/10/21   Raiford Noble Latif, DO  insulin detemir (LEVEMIR) 100 UNIT/ML injection Inject 0.06 mLs (6 Units total) into the skin at  bedtime. 04/02/21   Raiford Noble Latif, DO  octreotide (SANDOSTATIN LAR) 10 MG injection Inject 10 mg into the muscle every 28 (twenty-eight) days.    [provider]  pantoprazole (PROTONIX) 40 MG tablet Take 1 tablet (40 mg total) by mouth daily. 11/17/17   Angiulli, Lavon Paganini, PA-C  pravastatin (PRAVACHOL) 20 MG tablet Take 1 tablet (20 mg total) by mouth at bedtime. 11/16/17   Angiulli, Lavon Paganini, PA-C  QUEtiapine (SEROQUEL) 50 MG tablet Take 50 mg by mouth at bedtime.     [provider]  ramipril (ALTACE) 5 MG capsule Take 5 mg by mouth 2 (two) times daily.    [provider]  sertraline (ZOLOFT) 50 MG tablet Take 1 tablet (50 mg total) by mouth daily. 07/05/19   Frann Rider, NP  spironolactone (ALDACTONE) 25 MG tablet Take 0.5 tablets (12.5 mg total) by mouth every other day for 30 days. Patient taking differently: Take 12.5 mg by mouth See admin instructions. Take 12.5 mg by mouth twice daily every other day 12/04/18 04/01/21  Saundra Shelling, MD  Telotristat Ethyl,as Etiprate, (XERMELO) 250 MG TABS TAKE 1 TABLET (250 MG) BY MOUTH 3 (THREE) TIMES DAILY WITH MEALS.  03/28/21   Verlon Au, NP  traMADol (ULTRAM) 50 MG tablet 25 mg every other day.  08/09/19   [provider]    Allergies Other and Simvastatin    Social History Social History   Tobacco Use   Smoking status: Former   Smokeless tobacco: Never  Scientific laboratory technician Use: Never used  Substance Use Topics   Alcohol use: No   Drug use: No    Review of Systems Patient denies headaches, rhinorrhea, blurry vision, numbness, shortness of breath, chest pain, edema, cough, abdominal pain, nausea, vomiting, diarrhea, dysuria, fevers, rashes or hallucinations unless otherwise stated above in HPI. ____________________________________________   PHYSICAL EXAM:  VITAL SIGNS: Vitals:   05/12/2021 1904 05/12/2021 2138  BP:  (!) 154/60  Pulse:  80  Resp:  (!) 24  Temp:    SpO2: 99% 95%    Constitutional: chronically ill appearing, cachectic Eyes: Conjunctivae are normal.  Head: Atraumatic. Nose: No congestion/rhinnorhea. Mouth/Throat: Mucous membranes are moist.   Neck: No stridor. Painless ROM.  Cardiovascular: Normal rate, regular rhythm. Grossly normal heart sounds.  Good peripheral circulation. Respiratory: Normal respiratory effort.  No retractions. Lungs with coarse bs throughout, no wheezing noted Gastrointestinal: Soft and nontender. No distention. No abdominal bruits. No CVA tenderness. Genitourinary:  Musculoskeletal: No lower extremity tenderness nor edema. Scattered age indeterminate bruising Neurologic:  unable to cooperate with neuro exam. No gross focal neurologic deficits are appreciated. No facial droop Skin:  Skin is warm, dry and intact. No rash noted. Psychiatric: agitated, trying to slide out of bed requiring frequent redirection  ____________________________________________   LABS (all labs ordered are listed, but only abnormal results are displayed)  Results for orders placed or performed during the hospital encounter of 05/07/2021 (from the past 24  hour(s))  Lactic acid, plasma     Status: Abnormal   Collection Time: 04/26/2021  7:55 PM  Result Value Ref Range   Lactic Acid, Venous 2.3 (HH) 0.5 - 1.9 mmol/L  Comprehensive metabolic panel     Status: Abnormal   Collection Time: 04/28/2021  7:55 PM  Result Value Ref Range   Sodium 141 135 - 145 mmol/L   Potassium 4.5 3.5 - 5.1 mmol/L   Chloride 99 98 - 111 mmol/L   CO2 30  22 - 32 mmol/L   Glucose, Bld 75 70 - 99 mg/dL   BUN 33 (H) 8 - 23 mg/dL   Creatinine, Ser 1.23 (H) 0.44 - 1.00 mg/dL   Calcium 9.5 8.9 - 10.3 mg/dL   Total Protein 6.5 6.5 - 8.1 g/dL   Albumin 3.7 3.5 - 5.0 g/dL   AST 38 15 - 41 U/L   ALT 37 0 - 44 U/L   Alkaline Phosphatase 81 38 - 126 U/L   Total Bilirubin 1.5 (H) 0.3 - 1.2 mg/dL   GFR, Estimated 43 (L) >60 mL/min   Anion gap 12 5 - 15  CBC WITH DIFFERENTIAL     Status: Abnormal   Collection Time: 04/26/2021  7:55 PM  Result Value Ref Range   WBC 18.9 (H) 4.0 - 10.5 K/uL   RBC 3.24 (L) 3.87 - 5.11 MIL/uL   Hemoglobin 13.0 12.0 - 15.0 g/dL   HCT 38.9 36.0 - 46.0 %   MCV 120.1 (H) 80.0 - 100.0 fL   MCH 40.1 (H) 26.0 - 34.0 pg   MCHC 33.4 30.0 - 36.0 g/dL   RDW 18.6 (H) 11.5 - 15.5 %   Platelets 1,154 (HH) 150 - 400 K/uL   nRBC 0.5 (H) 0.0 - 0.2 %   Neutrophils Relative % 84 %   Neutro Abs 16.0 (H) 1.7 - 7.7 K/uL   Lymphocytes Relative 4 %   Lymphs Abs 0.8 0.7 - 4.0 K/uL   Monocytes Relative 9 %   Monocytes Absolute 1.7 (H) 0.1 - 1.0 K/uL   Eosinophils Relative 0 %   Eosinophils Absolute 0.0 0.0 - 0.5 K/uL   Basophils Relative 1 %   Basophils Absolute 0.1 0.0 - 0.1 K/uL   WBC Morphology MORPHOLOGY UNREMARKABLE    RBC Morphology MIXED RBC MORPHOLOGY    Smear Review PLATELETS APPEAR INCREASED    Immature Granulocytes 2 %   Abs Immature Granulocytes 0.34 (H) 0.00 - 0.07 K/uL  Protime-INR     Status: Abnormal   Collection Time: 05/01/2021  7:55 PM  Result Value Ref Range   Prothrombin Time 17.1 (H) 11.4 - 15.2 seconds   INR 1.4 (H) 0.8 - 1.2  APTT      Status: Abnormal   Collection Time: 05/12/2021  7:55 PM  Result Value Ref Range   aPTT 37 (H) 24 - 36 seconds  Procalcitonin - Baseline     Status: None   Collection Time: 05/11/2021  7:55 PM  Result Value Ref Range   Procalcitonin 1.21 ng/mL  Resp Panel by RT-PCR (Flu A&B, Covid) Nasopharyngeal Swab     Status: None   Collection Time: 05/19/2021  9:04 PM   Specimen: Nasopharyngeal Swab; Nasopharyngeal(NP) swabs in vial transport medium  Result Value Ref Range   SARS Coronavirus 2 by RT PCR NEGATIVE NEGATIVE   Influenza A by PCR NEGATIVE NEGATIVE   Influenza B by PCR NEGATIVE NEGATIVE   ____________________________________________  EKG My review and personal interpretation at Time: 20:27   Indication: ams  Rate: 85  Rhythm: sinus Axis: left Other: normal intervals, low voltage, poor r wave progression, no stemi ____________________________________________  RADIOLOGY  I personally reviewed all radiographic images ordered to evaluate for the above acute complaints and reviewed radiology reports and findings.  These findings were personally discussed with the patient.  Please see medical record for radiology report.  ____________________________________________   PROCEDURES  Procedure(s) performed:  .Critical Care Performed by: Merlyn Lot, MD Authorized by: Merlyn Lot, MD  Critical care provider statement:    Critical care time (minutes):  45   Critical care was necessary to treat or prevent imminent or life-threatening deterioration of the following conditions:  Respiratory failure   Critical care was time spent personally by me on the following activities:  Ordering and performing treatments and interventions, ordering and review of laboratory studies, ordering and review of radiographic studies, pulse oximetry, re-evaluation of patient's condition, review of old charts, obtaining history from patient or surrogate, examination of patient, evaluation of patient's  response to treatment, discussions with primary provider, discussions with consultants and development of treatment plan with patient or surrogate    Critical Care performed: yes  ____________________________________________   INITIAL IMPRESSION / [place] / ED COURSE  Pertinent labs & imaging results that were available during my care of the patient were reviewed by me and considered in my medical decision making (see chart for details).   DDX: Asthma, copd, CHF, pna, ptx, malignancy, Pe, anemia   [name] is a 87 y.o. who presents to the ED with symptoms described above.  Patient hypoxic on room air.  She is on nonrebreather with O2 sats in the upper 90s.  She is DNR/DNI.  Presentation with broad differential.  Chest x-ray read as possible pulmonary edema and cardiomegaly no fever.  We will hold off on aggressive IV hydration.  Lactate mildly elevated but I think this may be more secondary to hypoxia.  But given the patient's altered mental status and hypoxia pneumonia also concerning a possible aspiration event.  Will order IV antibiotics.  Blood work does show hemoconcentration with leukocytosis.  Platelets significantly elevated.  We will give gentle IV hydration  Clinical Course as of 05/21/2021 2234  Tue Apr 22, 2021  2109 Given 3 cell line with significant elevation suspect component of dehydration probable sepsis more likely than CHF particular with procalcitonin being significantly elevated.  We will continue with IV hydration.  She is satting in the upper 90s at this time we will give additional IV fluids and reassess. [PR]  2233 Patient seems to be improving with some IV hydration.  Patient having increasing episodes of agitation or requiring some calling agents.  Appears more comfortable at this time.  Discussed case with hospitalist for admission. [PR]    Clinical Course User Index [PR] Merlyn Lot, MD    The patient was evaluated in Emergency Department  today for the symptoms described in the history of present illness. He/she was evaluated in the context of the global COVID-19 pandemic, which necessitated consideration that the patient might be at risk for infection with the SARS-CoV-2 virus that causes COVID-19. Institutional protocols and algorithms that pertain to the evaluation of patients at risk for COVID-19 are in a state of rapid change based on information released by regulatory bodies including the CDC and federal and state organizations. These policies and algorithms were followed during the patient's care in the ED.  As part of my medical decision making, I reviewed the following data within the [facility] notes reviewed and incorporated, Labs reviewed, notes from prior ED visits and  Controlled Substance Database   ____________________________________________   FINAL CLINICAL IMPRESSION(S) / ED DIAGNOSES  Final diagnoses:  Acute respiratory failure with hypoxia (HCC)  Altered mental status, unspecified altered mental status type      NEW MEDICATIONS STARTED DURING THIS VISIT:  New Prescriptions   No medications on file     Note:  This document  was prepared using Systems analyst and may include unintentional dictation errors.    Merlyn Lot, MD 05/02/2021 2234

## 2021-04-22 NOTE — Consult Note (Signed)
PHARMACY -  BRIEF ANTIBIOTIC NOTE   Pharmacy has received consult(s) for vancomycin and cefepime from an ED provider.  The patient's profile has been reviewed for ht/wt/allergies/indication/available labs.    One time order(s) placed for  --Cefepime 2 g IV --Vancomycin 1 g IV (~25 mg/kg)  Further antibiotics/pharmacy consults should be ordered by admitting physician if indicated.                       Thank you, Benita Gutter 04/27/2021  8:38 PM

## 2021-04-23 ENCOUNTER — Inpatient Hospital Stay: Payer: Medicare PPO

## 2021-04-23 DIAGNOSIS — J9601 Acute respiratory failure with hypoxia: Secondary | ICD-10-CM | POA: Diagnosis not present

## 2021-04-23 LAB — TROPONIN I (HIGH SENSITIVITY)
Troponin I (High Sensitivity): 13 ng/L (ref <18)
Troponin I (High Sensitivity): 25 ng/L — ABNORMAL HIGH (ref <18)

## 2021-04-23 LAB — PROCALCITONIN: Procalcitonin: 2.48 ng/mL

## 2021-04-23 LAB — URINALYSIS, COMPLETE (UACMP) WITH MICROSCOPIC
Bacteria, UA: NONE SEEN
Bilirubin Urine: NEGATIVE
Glucose, UA: NEGATIVE mg/dL
Hgb urine dipstick: NEGATIVE
Ketones, ur: NEGATIVE mg/dL
Leukocytes,Ua: NEGATIVE
Nitrite: NEGATIVE
Protein, ur: 30 mg/dL — AB
Specific Gravity, Urine: 1.014 (ref 1.005–1.030)
Squamous Epithelial / HPF: NONE SEEN (ref 0–5)
pH: 5 (ref 5.0–8.0)

## 2021-04-23 LAB — CK: Total CK: 40 U/L (ref 38–234)

## 2021-04-23 LAB — COMPREHENSIVE METABOLIC PANEL
ALT: 39 U/L (ref 0–44)
AST: 43 U/L — ABNORMAL HIGH (ref 15–41)
Albumin: 2.9 g/dL — ABNORMAL LOW (ref 3.5–5.0)
Alkaline Phosphatase: 65 U/L (ref 38–126)
Anion gap: 13 (ref 5–15)
BUN: 34 mg/dL — ABNORMAL HIGH (ref 8–23)
CO2: 24 mmol/L (ref 22–32)
Calcium: 8.2 mg/dL — ABNORMAL LOW (ref 8.9–10.3)
Chloride: 104 mmol/L (ref 98–111)
Creatinine, Ser: 1.37 mg/dL — ABNORMAL HIGH (ref 0.44–1.00)
GFR, Estimated: 37 mL/min — ABNORMAL LOW (ref >60)
Glucose, Bld: 137 mg/dL — ABNORMAL HIGH (ref 70–99)
Potassium: 4.3 mmol/L (ref 3.5–5.1)
Sodium: 141 mmol/L (ref 135–145)
Total Bilirubin: 1.1 mg/dL (ref 0.3–1.2)
Total Protein: 5.2 g/dL — ABNORMAL LOW (ref 6.5–8.1)

## 2021-04-23 LAB — CBC
HCT: 32.9 % — ABNORMAL LOW (ref 36.0–46.0)
Hemoglobin: 10.8 g/dL — ABNORMAL LOW (ref 12.0–15.0)
MCH: 40.6 pg — ABNORMAL HIGH (ref 26.0–34.0)
MCHC: 32.8 g/dL (ref 30.0–36.0)
MCV: 123.7 fL — ABNORMAL HIGH (ref 80.0–100.0)
Platelets: 816 10*3/uL — ABNORMAL HIGH (ref 150–400)
RBC: 2.66 MIL/uL — ABNORMAL LOW (ref 3.87–5.11)
RDW: 19.4 % — ABNORMAL HIGH (ref 11.5–15.5)
WBC: 19.3 10*3/uL — ABNORMAL HIGH (ref 4.0–10.5)
nRBC: 0.3 % — ABNORMAL HIGH (ref 0.0–0.2)

## 2021-04-23 LAB — T4, FREE: Free T4: 0.82 ng/dL (ref 0.61–1.12)

## 2021-04-23 LAB — GAMMA GT: GGT: 239 U/L — ABNORMAL HIGH (ref 7–50)

## 2021-04-23 LAB — TSH: TSH: 3.231 u[IU]/mL (ref 0.350–4.500)

## 2021-04-23 MED ORDER — HALOPERIDOL LACTATE 5 MG/ML IJ SOLN: 2.0000 mg | INTRAMUSCULAR | Status: DC | PRN

## 2021-04-23 MED ORDER — ONDANSETRON 4 MG PO TBDP
4.0000 mg | ORAL_TABLET | Freq: Four times a day (QID) | ORAL | Status: DC | PRN
  Filled 2021-04-23: qty 1

## 2021-04-23 MED ORDER — DIPHENHYDRAMINE HCL 50 MG/ML IJ SOLN: 12.5000 mg | INTRAMUSCULAR | Status: DC | PRN

## 2021-04-23 MED ORDER — LORAZEPAM 2 MG/ML PO CONC
1.0000 mg | ORAL | Status: DC | PRN
  Filled 2021-04-23: qty 0.5

## 2021-04-23 MED ORDER — GLYCOPYRROLATE 1 MG PO TABS
1.0000 mg | ORAL_TABLET | ORAL | Status: DC | PRN
  Filled 2021-04-23: qty 1

## 2021-04-23 MED ORDER — SODIUM CHLORIDE 0.9% FLUSH
3.0000 mL | Freq: Two times a day (BID) | INTRAVENOUS | Status: DC
  Administered 2021-04-23 (×2): 3 mL via INTRAVENOUS

## 2021-04-23 MED ORDER — ACETAMINOPHEN 325 MG RE SUPP: 650.0000 mg | RECTAL | Status: DC | PRN

## 2021-04-23 MED ORDER — ALBUTEROL SULFATE (2.5 MG/3ML) 0.083% IN NEBU: 2.5000 mg | INHALATION_SOLUTION | RESPIRATORY_TRACT | Status: DC | PRN

## 2021-04-23 MED ORDER — HALOPERIDOL LACTATE 2 MG/ML PO CONC
0.5000 mg | ORAL | Status: DC | PRN
  Filled 2021-04-23: qty 0.3

## 2021-04-23 MED ORDER — HALOPERIDOL 0.5 MG PO TABS
0.5000 mg | ORAL_TABLET | ORAL | Status: DC | PRN
  Filled 2021-04-23: qty 1

## 2021-04-23 MED ORDER — HALOPERIDOL LACTATE 5 MG/ML IJ SOLN
0.5000 mg | INTRAMUSCULAR | Status: DC | PRN
  Administered 2021-04-23: 0.5 mg via INTRAVENOUS
  Filled 2021-04-23: qty 1

## 2021-04-23 MED ORDER — GLYCOPYRROLATE 0.2 MG/ML IJ SOLN
0.2000 mg | INTRAMUSCULAR | Status: DC | PRN
  Filled 2021-04-23: qty 1

## 2021-04-23 MED ORDER — IOHEXOL 350 MG/ML SOLN
60.0000 mL | Freq: Once | INTRAVENOUS | Status: AC | PRN
  Administered 2021-04-23: 60 mL via INTRAVENOUS

## 2021-04-23 MED ORDER — HYDROMORPHONE HCL 1 MG/ML IJ SOLN
1.0000 mg | INTRAMUSCULAR | Status: DC | PRN
  Administered 2021-04-24 – 2021-04-25 (×3): 1 mg via INTRAVENOUS
  Filled 2021-04-23 (×3): qty 1

## 2021-04-23 MED ORDER — SODIUM CHLORIDE 0.9% FLUSH: 3.0000 mL | INTRAVENOUS | Status: DC | PRN

## 2021-04-23 MED ORDER — ACETAMINOPHEN 325 MG PO TABS: 650.0000 mg | ORAL_TABLET | Freq: Four times a day (QID) | ORAL | Status: DC | PRN

## 2021-04-23 MED ORDER — LORAZEPAM 2 MG/ML IJ SOLN
1.0000 mg | INTRAMUSCULAR | Status: DC | PRN
  Administered 2021-04-23 – 2021-04-25 (×8): 1 mg via INTRAVENOUS
  Filled 2021-04-23 (×8): qty 1

## 2021-04-23 MED ORDER — ONDANSETRON HCL 4 MG/2ML IJ SOLN: 4.0000 mg | Freq: Four times a day (QID) | INTRAMUSCULAR | Status: DC | PRN

## 2021-04-23 MED ORDER — VANCOMYCIN HCL 750 MG/150ML IV SOLN: 750.0000 mg | INTRAVENOUS | Status: DC

## 2021-04-23 MED ORDER — BISACODYL 10 MG RE SUPP
10.0000 mg | Freq: Every day | RECTAL | Status: DC | PRN
  Filled 2021-04-23: qty 1

## 2021-04-23 MED ORDER — SODIUM CHLORIDE 0.9 % IV SOLN: 250.0000 mL | INTRAVENOUS | Status: DC | PRN

## 2021-04-23 MED ORDER — ACETAMINOPHEN 650 MG RE SUPP: 650.0000 mg | Freq: Four times a day (QID) | RECTAL | Status: DC | PRN

## 2021-04-23 MED ORDER — LORAZEPAM 2 MG/ML IJ SOLN
1.0000 mg | INTRAMUSCULAR | Status: DC | PRN
  Administered 2021-04-23: 1 mg via INTRAVENOUS
  Filled 2021-04-23: qty 1

## 2021-04-23 MED ORDER — POLYVINYL ALCOHOL 1.4 % OP SOLN
1.0000 [drp] | Freq: Four times a day (QID) | OPHTHALMIC | Status: DC | PRN
  Filled 2021-04-23: qty 15

## 2021-04-23 MED ORDER — LORAZEPAM 1 MG PO TABS: 1.0000 mg | ORAL_TABLET | ORAL | Status: DC | PRN

## 2021-04-23 MED ORDER — HYDROMORPHONE HCL 1 MG/ML IJ SOLN: 0.5000 mg | INTRAMUSCULAR | Status: DC | PRN

## 2021-04-23 NOTE — ED Notes (Addendum)
Pt switched to comfort care. Family given a recliner for comfort and educated to let us know if anything is needed.   Chaplain also offered to pt and family,  "family states maybe when the rest of the family gets here"

## 2021-04-23 NOTE — ED Notes (Signed)
Per Ulyses Jarred, RN, okay to send pt to IP room.

## 2021-04-23 NOTE — Progress Notes (Signed)
Full note to follow  Comfort care and hospice pathway initiated . Unrestricted visitor policy. May [place] in hospital. Stay in hospital for today.

## 2021-04-23 NOTE — ED Notes (Signed)
Pt placed on a hospital bed, brief changed and pt cleaned up, new purewick placed.   Cardiac monitor turned on to comfort care.

## 2021-04-23 NOTE — ED Notes (Addendum)
Comfort care tray ordered for pt and family.  Will order hospital bed for pt to make pt more comfortable at this time.

## 2021-04-23 NOTE — ED Notes (Signed)
ED to IP SBAR report sent via secure msg to [facility]

## 2021-04-23 NOTE — ED Notes (Signed)
Pt becoming more agitated at this time, trying to take off O2 mask.  Will administer prn ativan and continue to monitor.

## 2021-04-23 NOTE — ED Notes (Signed)
Michelle RN aware of assigned bed 

## 2021-04-23 NOTE — ED Notes (Signed)
Pt medicated for comfort per MAR. Chaplain at [employer] [facility] [employer] No additional needs verbalized by family. Pt resting in bed now, NAD, chest rise & fall. Bed low & locked. Call light within reach of family.

## 2021-04-23 NOTE — ED Notes (Signed)
Pt medicated for agitation, per MAR. No additional needs verbalized by family at this time. Pt resting in bed. Family at [facility]. Bed low & locked. Call light within reach.

## 2021-04-23 NOTE — ED Notes (Signed)
Pt starting to become agitated and try to get out of bed, niece at bedside and requests something to relax her IVP PRN ativan given. No other needs at this time. Call bell in reach.

## 2021-04-23 NOTE — ED Notes (Signed)
Pt sleeping, resting comfortably in bed, NAD, chest rise & fall. No needs identified at this time. Family at [facility]. Bed low & locked. Call light within reach of family.

## 2021-04-23 NOTE — ED Notes (Signed)
Purewick replaced & connected to suction. Pt sleeping, resting comfortably in bed, NAD, chest rise & fall. No additional needs verbalized by family at this time. Bed low & locked. Call light within reach & family at [facility].

## 2021-04-23 NOTE — ED Notes (Signed)
Pt medicated for comfort per MAR. Mouth swabs provided per family request. No additional needs verbalized by family at this time. Bed low & locked. Family at [facility].

## 2021-04-23 NOTE — Progress Notes (Signed)
Chaplain Maggie visited at bedside with pt's niece present for entire visit. Compassionate presence, empathetic listening and prayer were central to this visit. Pt is sedated and surrounded by supportive family. Grief support is appreciated. Continued support available per on call chaplain.

## 2021-04-23 NOTE — Progress Notes (Signed)
PROGRESS NOTE    [name]  JKD:[id number] DOB: 26-Jun-1931 DOA: 05/12/2021 PCP: Baxter Hire, MD    Brief Narrative:  87 year old female with history of dementia, recent nontraumatic intracerebral hemorrhage, hypertension, coronary artery disease and recent worsening frailty and debility brought to the emergency room with shortness of breath and cough.  Found hypoxic 88% on room air.  Agitated and combative.  She was pointing to her right knee but unable to give any response. On arrival to emergency room, blood pressures were adequate.  She was tachypneic.  Initially on nonrebreather.  Found leukocytosis with WC count 18.9.  Lactic acid 2.3.  X-ray with cardiomegaly.  CT angiogram negative for pulmonary embolism, basal atelectasis.  Patient was found with acute respiratory failure with hypoxemia, acute metabolic encephalopathy and currently suffering from agitation and discomfort. 11/2, decided to enroll into comfort care and hospice pathway.   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  Possible aspiration pneumonitis. Advanced dementia. Failure to thrive. Multiple medical problems. Patient's goal of care to remain comfortable and peaceful. Sepsis present on admission, likely secondary to aspiration pneumonitis.  Plan: A careful discussion was done with patient's healthcare power of attorney her niece at bedside.  Goals of care and possible outcomes discussed.  Patient with advanced stage dementia and debility currently suffering from respiratory distress.  According to patient's goal of care,  she would not like to live with such uncomfortable situation.  Patient also getting agitated and uncomfortable intermittently.  Given overall poor prognosis and end-of-life, started on comfort care measures. All comfort care medications available. Unrestricted visitor policy. RN may pronounce death if happens in the hospital. Will monitor her outcome, anticipate hospital death.  If survives, will need hospice referral however this is less likely needed.   DVT prophylaxis:   Comfort care   Code Status: Comfort care Family Communication: Niece Ree Edman at the bedside. Disposition Plan: Status is: Inpatient  Remains inpatient appropriate because: End-of-life.  Critical transfer.         Consultants:  None  Procedures:  None  Antimicrobials:  None   Subjective: Patient seen and examined.  She was uncomfortable, was just given Ativan in the morning rounds.  Could not respond to any questions.  On nonrebreather.  Moans on movement of the legs.  Objective: Vitals:   04/23/21 0215 04/23/21 0600 04/23/21 0700 04/23/21 0800  BP: 124/60 117/60 134/71 135/77  Pulse: 81 75 79 80  Resp: (!) 23 19 19  (!) 26  Temp:      TempSrc:      SpO2: 95% 95% 95% 92%  Weight:        Intake/Output Summary (Last 24 hours) at 04/23/2021 1443 Last data filed at 04/23/2021 0003 Gross per 24 hour  Intake 1550 ml  Output --  Net 1550 ml   Filed Weights   04/24/2021 1855  Weight: 42.8 kg    Examination:  General: Ill-appearing.  Frail and debilitated.  Cachectic. In moderate respiratory distress.  She was on nonrebreather.  Medicated for comfort. Cardiovascular: S1-S2 normal.  Tachycardic. Respiratory: Bilateral poor air entry.  Shallow breathing.  Conducted  airway sounds.  Moderate distress. Gastrointestinal: Soft.  No palpable tenderness. Ext: No swelling or edema.  Deformities both knees.  She has ecchymosis and dryness of both legs. Neuro: Unresponsive.  Moans on  stimuli.      Data Reviewed: I have personally reviewed following labs and imaging studies  CBC: Recent Labs  Lab 05/21/2021 1955 04/23/21 0625  WBC 18.9* 19.3*  NEUTROABS 16.0*  --   HGB 13.0 10.8*  HCT 38.9 32.9*  MCV 120.1* 123.7*  PLT 1,154* 976*   Basic Metabolic Panel: Recent Labs  Lab 05/21/2021 1955 04/23/21 0625  NA 141 141  K 4.5 4.3  CL 99 104  CO2 30 24  GLUCOSE 75 137*  BUN 33* 34*  CREATININE 1.23* 1.37*  CALCIUM 9.5 8.2*   GFR: Estimated Creatinine Clearance: 19.5 mL/min (A) (by C-G formula based on SCr of 1.37 mg/dL (H)). Liver Function Tests: Recent Labs  Lab 05/04/2021 1955 04/23/21 0625  AST 38 43*  ALT 37 39  ALKPHOS 81 65  BILITOT 1.5* 1.1  PROT 6.5 5.2*  ALBUMIN 3.7 2.9*   No results for input(s): LIPASE, AMYLASE in the last 168 hours. No results for input(s): AMMONIA in the last 168 hours. Coagulation Profile: Recent Labs  Lab 04/30/2021 1955  INR 1.4*   Cardiac Enzymes: Recent Labs  Lab 04/23/21 0625  CKTOTAL 40   BNP (last 3 results) No results for input(s): PROBNP in the last 8760 hours. HbA1C: No results for input(s): HGBA1C in the last 72 hours. CBG: No results for input(s): GLUCAP in the last 168 hours. Lipid Profile: No results for input(s): CHOL, HDL, LDLCALC, TRIG, CHOLHDL, LDLDIRECT in the last 72 hours. Thyroid Function Tests: Recent Labs    05/13/2021 1955  TSH 3.231  FREET4 0.82   Anemia Panel: No results for input(s): VITAMINB12, FOLATE, FERRITIN, TIBC, IRON, RETICCTPCT in the last 72 hours. Sepsis Labs: Recent Labs  Lab 05/11/2021 1955 05/15/2021 2249 04/23/21 0625  PROCALCITON 1.21  --  2.48  LATICACIDVEN 2.3* 1.7  --     Recent Results (from the past 240 hour(s))  Blood Culture (routine x 2)     Status: None (Preliminary result)   Collection Time: 05/11/2021  7:55 PM   Specimen: BLOOD  Result Value Ref Range Status   Specimen Description BLOOD LEFT ANTECUBITAL  Final   Special Requests   Final     BOTTLES DRAWN AEROBIC AND ANAEROBIC Blood Culture adequate volume   Culture   Final    NO GROWTH < 12 HOURS Performed at [facility], [address], [place], [place] [address]    Report Status PENDING  Incomplete  Resp Panel by RT-PCR (Flu A&B, Covid) Nasopharyngeal Swab     Status: None   Collection Time: 05/09/2021  9:04 PM   Specimen: Nasopharyngeal Swab; Nasopharyngeal(NP) swabs in vial transport medium  Result Value Ref Range Status   SARS Coronavirus 2 by RT PCR NEGATIVE NEGATIVE Final    Comment: (NOTE) SARS-CoV-2 target nucleic acids are NOT DETECTED.  The SARS-CoV-2 RNA is generally detectable in upper respiratory specimens during the acute phase of infection. The lowest concentration of SARS-CoV-2 viral copies this assay can detect is 138 copies/mL. A negative result does not preclude SARS-Cov-2 infection and should not be used as the sole basis for treatment or other patient management decisions. A negative result may occur with  improper specimen collection/handling, submission of specimen other than nasopharyngeal swab, presence of viral mutation(s) within the areas targeted by  this assay, and inadequate number of viral copies(<138 copies/mL). A negative result must be combined with clinical observations, patient history, and epidemiological information. The expected result is Negative.  Fact Sheet for Patients:  EntrepreneurPulse.com.au  Fact Sheet for Healthcare Providers:  IncredibleEmployment.be  This test is no t yet approved or cleared by the [place] FDA and  has been authorized for detection and/or diagnosis of SARS-CoV-2 by FDA under an Emergency Use Authorization (EUA). This EUA will remain  in effect (meaning this test can be used) for the duration of the COVID-19 declaration under Section 564(b)(1) of the Act, 21 U.S.C.section 360bbb-3(b)(1), unless the authorization is terminated  or revoked sooner.        Influenza A by PCR NEGATIVE NEGATIVE Final   Influenza B by PCR NEGATIVE NEGATIVE Final    Comment: (NOTE) The Xpert Xpress SARS-CoV-2/FLU/RSV plus assay is intended as an aid in the diagnosis of influenza from Nasopharyngeal swab specimens and should not be used as a sole basis for treatment. Nasal washings and aspirates are unacceptable for Xpert Xpress SARS-CoV-2/FLU/RSV testing.  Fact Sheet for Patients: EntrepreneurPulse.com.au  Fact Sheet for Healthcare Providers: IncredibleEmployment.be  This test is not yet approved or cleared by the [place] FDA and has been authorized for detection and/or diagnosis of SARS-CoV-2 by FDA under an Emergency Use Authorization (EUA). This EUA will remain in effect (meaning this test can be used) for the duration of the COVID-19 declaration under Section 564(b)(1) of the Act, 21 U.S.C. section 360bbb-3(b)(1), unless the authorization is terminated or revoked.  Performed at [facility], [address] [place], [place] [address]          Radiology Studies: CT Angio Chest Pulmonary Embolism (PE) W or WO Contrast  Result Date: 04/23/2021 CLINICAL DATA:  Encephalopathy EXAM: CT ANGIOGRAPHY CHEST WITH CONTRAST TECHNIQUE: Multidetector CT imaging of the chest was performed using the standard protocol during bolus administration of intravenous contrast. Multiplanar CT image reconstructions and MIPs were obtained to evaluate the vascular anatomy. CONTRAST:  54mL OMNIPAQUE IOHEXOL 350 MG/ML SOLN COMPARISON:  None. FINDINGS: Cardiovascular: Contrast injection is sufficient to demonstrate satisfactory opacification of the pulmonary arteries to the segmental level. There is no pulmonary embolus or evidence of right heart strain. The size of the main pulmonary artery is normal. Heart size is normal, with no pericardial effusion. Aortic caliber measures 3.8 cm just above the diaphragmatic hiatus.  There is calcific aortic atherosclerosis. Mediastinum/Nodes: No mediastinal, hilar or axillary lymphadenopathy. Normal visualized thyroid. Thoracic esophageal course is normal. Lungs/Pleura: Bibasilar atelectasis.  Small pleural effusions. Upper Abdomen: Contrast bolus timing is not optimized for evaluation of the abdominal organs. The visualized portions of the organs of the upper abdomen are normal. Musculoskeletal: No chest wall abnormality. No bony spinal canal stenosis. Review of the MIP images confirms the above findings. IMPRESSION: 1. No pulmonary embolus or acute aortic syndrome. 2. Small pleural effusions and bibasilar atelectasis. Aortic Atherosclerosis (ICD10-I70.0). Electronically Signed   By: Ulyses Jarred M.D.   On: 04/23/2021 02:13   DG Chest Port 1 View  Result Date: 05/21/2021 CLINICAL DATA:  Questionable sepsis. EXAM: PORTABLE CHEST 1 VIEW COMPARISON:  Chest radiograph dated 07/18/2019. FINDINGS: Cardiomegaly with vascular congestion and edema. Bibasilar atelectasis or infiltrate. No pleural effusion pneumothorax. Atherosclerotic calcification of the aorta. No acute osseous pathology. IMPRESSION: 1. Cardiomegaly with vascular congestion and edema. 2. Bibasilar atelectasis or infiltrate. Electronically Signed   By: Anner Crete M.D.   On: 05/19/2021 19:04  Scheduled Meds:  sodium chloride flush  3 mL Intravenous Q12H   Continuous Infusions:  sodium chloride       LOS: 1 day    Time spent: 35 minutes    Barb Merino, MD [facility] Pager [phone]

## 2021-04-24 DIAGNOSIS — Z515 Encounter for palliative care: Secondary | ICD-10-CM | POA: Diagnosis not present

## 2021-04-24 DIAGNOSIS — I614 Nontraumatic intracerebral hemorrhage in cerebellum: Secondary | ICD-10-CM

## 2021-04-24 DIAGNOSIS — I25118 Atherosclerotic heart disease of native coronary artery with other forms of angina pectoris: Secondary | ICD-10-CM | POA: Diagnosis not present

## 2021-04-24 DIAGNOSIS — Z66 Do not resuscitate: Secondary | ICD-10-CM

## 2021-04-24 DIAGNOSIS — I10 Essential (primary) hypertension: Secondary | ICD-10-CM

## 2021-04-24 DIAGNOSIS — R4182 Altered mental status, unspecified: Secondary | ICD-10-CM | POA: Diagnosis not present

## 2021-04-24 DIAGNOSIS — J9601 Acute respiratory failure with hypoxia: Secondary | ICD-10-CM | POA: Diagnosis not present

## 2021-04-24 NOTE — Progress Notes (Signed)
PROGRESS NOTE    ELEISHA BRANSCOMB  LDJ:[id number] DOB: 02-20-1932 DOA: 04/28/2021 PCP: Baxter Hire, MD   Chief Complain: Shortness of breath, cough  Brief Narrative: Patient is 44 old female with history of dementia, recent nontraumatic intracerebral hemorrhage, hypertension, CAD who was brought to the emergency department with complaint of shortness of breath and cough.  She was hypoxic on room air on presentation.  She was agitated and combative.  She was put on under percent nonrebreather on presentation.  Lab work showed leukocytosis, lactic acidosis.  Patient was found to be in acute respiratory failure with hypoxia.  Patient's family decided to enroll into comfort care/hospice.  Currently she is on full comfort care..  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  Patient has history of advanced dementia.  Goals of care: Extensive discussion held with patient's family.  Palliative care also following.  Currently she is on full comfort care.  Anticipate hospital death.         DVT prophylaxis:None Code Status: Comfort care Family Communication: family present at bedside Status is: Inpatient  Remains inpatient appropriate because: Currently on comfort care      Consultants: Palliative care  Procedures: None  Antimicrobials:  Anti-infectives (From admission, onward)    Start     Dose/Rate Route Frequency Ordered Stop   04/24/21 2200  vancomycin (VANCOREADY) IVPB 750 mg/150 mL  Status:  Discontinued        750 mg 150 mL/hr over 60 Minutes Intravenous Every 48  hours 04/23/21 0054 04/23/21 0832   05/17/2021 2345  ceFEPIme (MAXIPIME) 2 g in sodium chloride 0.9 % 100 mL IVPB  Status:  Discontinued        2 g 200 mL/hr over 30 Minutes Intravenous  Once 04/28/2021 2335 05/08/2021 2350   05/09/2021 2345  azithromycin (ZITHROMAX) 500 mg in sodium chloride 0.9 % 250 mL IVPB  Status:  Discontinued        500 mg 250 mL/hr over 60 Minutes Intravenous  Once 04/28/2021 2335 05/07/2021 2349   05/20/2021 2045  vancomycin (VANCOCIN) IVPB 1000 mg/200 mL premix        1,000 mg 200 mL/hr over 60 Minutes Intravenous  Once 04/23/2021 2030 04/23/21 0003   05/07/2021 2045  ceFEPIme (MAXIPIME) 2 g in sodium chloride 0.9 % 100 mL IVPB        2 g 200 mL/hr over 30 Minutes Intravenous  Once 05/11/2021 2030 04/29/2021 2115   05/17/2021 2045  azithromycin (ZITHROMAX) 500 mg in sodium chloride 0.9 % 250 mL IVPB        500 mg 250 mL/hr over 60 Minutes Intravenous  Once 05/04/2021 2030 05/06/2021 2212       Subjective:  Patient seen and examined at the bedside.  She was in mild to moderate respiratory distress.  Looks uncomfortable, family at bedside. Objective: Vitals:   04/23/21 0800 04/23/21 0930 04/23/21 2138 04/23/21 2248  BP: 135/77 (!) 147/70 126/64   Pulse: 80 78 82   Resp: (!) 26 19    Temp:   (!) 97.1 F (36.2 C)   TempSrc:  Axillary   SpO2: 92% 95%  93%  Weight:       No intake or output data in the 24 hours ending 04/24/21 1129 Filed Weights   05/09/2021 1855  Weight: 42.8 kg    Examination:  General exam: Tachypneic, in moderate respiratory distress distress, unresponsive    Data Reviewed: I have personally reviewed following labs and imaging studies  CBC: Recent Labs  Lab 05/18/2021 1955 04/23/21 0625  WBC 18.9* 19.3*  NEUTROABS 16.0*  --   HGB 13.0 10.8*  HCT 38.9 32.9*  MCV 120.1* 123.7*  PLT 1,154* 300*   Basic Metabolic Panel: Recent Labs  Lab 05/17/2021 1955 04/23/21 0625  NA 141 141  K 4.5 4.3  CL 99 104  CO2 30 24  GLUCOSE 75 137*  BUN 33* 34*   CREATININE 1.23* 1.37*  CALCIUM 9.5 8.2*   GFR: Estimated Creatinine Clearance: 19.5 mL/min (A) (by C-G formula based on SCr of 1.37 mg/dL (H)). Liver Function Tests: Recent Labs  Lab 05/03/2021 1955 04/23/21 0625  AST 38 43*  ALT 37 39  ALKPHOS 81 65  BILITOT 1.5* 1.1  PROT 6.5 5.2*  ALBUMIN 3.7 2.9*   No results for input(s): LIPASE, AMYLASE in the last 168 hours. No results for input(s): AMMONIA in the last 168 hours. Coagulation Profile: Recent Labs  Lab 05/01/2021 1955  INR 1.4*   Cardiac Enzymes: Recent Labs  Lab 04/23/21 0625  CKTOTAL 40   BNP (last 3 results) No results for input(s): PROBNP in the last 8760 hours. HbA1C: No results for input(s): HGBA1C in the last 72 hours. CBG: No results for input(s): GLUCAP in the last 168 hours. Lipid Profile: No results for input(s): CHOL, HDL, LDLCALC, TRIG, CHOLHDL, LDLDIRECT in the last 72 hours. Thyroid Function Tests: Recent Labs    05/17/2021 1955  TSH 3.231  FREET4 0.82   Anemia Panel: No results for input(s): VITAMINB12, FOLATE, FERRITIN, TIBC, IRON, RETICCTPCT in the last 72 hours. Sepsis Labs: Recent Labs  Lab 05/06/2021 1955 04/27/2021 2249 04/23/21 0625  PROCALCITON 1.21  --  2.48  LATICACIDVEN 2.3* 1.7  --     Recent Results (from the past 240 hour(s))  Blood Culture (routine x 2)     Status: None (Preliminary result)   Collection Time: 04/28/2021  7:55 PM   Specimen: BLOOD  Result Value Ref Range Status   Specimen Description BLOOD LEFT ANTECUBITAL  Final   Special Requests   Final    BOTTLES DRAWN AEROBIC AND ANAEROBIC Blood Culture adequate volume   Culture   Final    NO GROWTH 2 DAYS Performed at [facility], [address], [place], [place] [address]    Report Status PENDING  Incomplete  Resp Panel by RT-PCR (Flu A&B, Covid) Nasopharyngeal Swab     Status: None   Collection Time: 05/20/2021  9:04 PM   Specimen: Nasopharyngeal Swab; Nasopharyngeal(NP) swabs in vial transport medium   Result Value Ref Range Status   SARS Coronavirus 2 by RT PCR NEGATIVE NEGATIVE Final    Comment: (NOTE) SARS-CoV-2 target nucleic acids are NOT DETECTED.  The SARS-CoV-2 RNA is generally detectable in upper respiratory specimens during the acute phase of infection. The lowest concentration of SARS-CoV-2 viral copies this assay can detect is 138 copies/mL. A negative result does not preclude SARS-Cov-2 infection and should not be used as the sole basis for treatment or other patient management decisions. A negative result may occur with  improper specimen collection/handling, submission of  specimen other than nasopharyngeal swab, presence of viral mutation(s) within the areas targeted by this assay, and inadequate number of viral copies(<138 copies/mL). A negative result must be combined with clinical observations, patient history, and epidemiological information. The expected result is Negative.  Fact Sheet for Patients:  EntrepreneurPulse.com.au  Fact Sheet for Healthcare Providers:  IncredibleEmployment.be  This test is no t yet approved or cleared by the [place] FDA and  has been authorized for detection and/or diagnosis of SARS-CoV-2 by FDA under an Emergency Use Authorization (EUA). This EUA will remain  in effect (meaning this test can be used) for the duration of the COVID-19 declaration under Section 564(b)(1) of the Act, 21 U.S.C.section 360bbb-3(b)(1), unless the authorization is terminated  or revoked sooner.       Influenza A by PCR NEGATIVE NEGATIVE Final   Influenza B by PCR NEGATIVE NEGATIVE Final    Comment: (NOTE) The Xpert Xpress SARS-CoV-2/FLU/RSV plus assay is intended as an aid in the diagnosis of influenza from Nasopharyngeal swab specimens and should not be used as a sole basis for treatment. Nasal washings and aspirates are unacceptable for Xpert Xpress SARS-CoV-2/FLU/RSV testing.  Fact Sheet for  Patients: EntrepreneurPulse.com.au  Fact Sheet for Healthcare Providers: IncredibleEmployment.be  This test is not yet approved or cleared by the [place] FDA and has been authorized for detection and/or diagnosis of SARS-CoV-2 by FDA under an Emergency Use Authorization (EUA). This EUA will remain in effect (meaning this test can be used) for the duration of the COVID-19 declaration under Section 564(b)(1) of the Act, 21 U.S.C. section 360bbb-3(b)(1), unless the authorization is terminated or revoked.  Performed at [facility], [address] [place], [place] [address]          Radiology Studies: CT Angio Chest Pulmonary Embolism (PE) W or WO Contrast  Result Date: 04/23/2021 CLINICAL DATA:  Encephalopathy EXAM: CT ANGIOGRAPHY CHEST WITH CONTRAST TECHNIQUE: Multidetector CT imaging of the chest was performed using the standard protocol during bolus administration of intravenous contrast. Multiplanar CT image reconstructions and MIPs were obtained to evaluate the vascular anatomy. CONTRAST:  35mL OMNIPAQUE IOHEXOL 350 MG/ML SOLN COMPARISON:  None. FINDINGS: Cardiovascular: Contrast injection is sufficient to demonstrate satisfactory opacification of the pulmonary arteries to the segmental level. There is no pulmonary embolus or evidence of right heart strain. The size of the main pulmonary artery is normal. Heart size is normal, with no pericardial effusion. Aortic caliber measures 3.8 cm just above the diaphragmatic hiatus. There is calcific aortic atherosclerosis. Mediastinum/Nodes: No mediastinal, hilar or axillary lymphadenopathy. Normal visualized thyroid. Thoracic esophageal course is normal. Lungs/Pleura: Bibasilar atelectasis.  Small pleural effusions. Upper Abdomen: Contrast bolus timing is not optimized for evaluation of the abdominal organs. The visualized portions of the organs of the upper abdomen are normal. Musculoskeletal:  No chest wall abnormality. No bony spinal canal stenosis. Review of the MIP images confirms the above findings. IMPRESSION: 1. No pulmonary embolus or acute aortic syndrome. 2. Small pleural effusions and bibasilar atelectasis. Aortic Atherosclerosis (ICD10-I70.0). Electronically Signed   By: Ulyses Jarred M.D.   On: 04/23/2021 02:13   DG Chest Port 1 View  Result Date: 04/24/2021 CLINICAL DATA:  Questionable sepsis. EXAM: PORTABLE CHEST 1 VIEW COMPARISON:  Chest radiograph dated 07/18/2019. FINDINGS: Cardiomegaly with vascular congestion and edema. Bibasilar atelectasis or infiltrate. No pleural effusion pneumothorax. Atherosclerotic calcification of the aorta. No acute osseous pathology. IMPRESSION: 1. Cardiomegaly with vascular congestion and edema. 2. Bibasilar atelectasis or infiltrate. Electronically Signed  By: Anner Crete M.D.   On: 05/09/2021 19:04        Scheduled Meds: Continuous Infusions:   LOS: 2 days    Time spent: 25 mins,More than 50% of that time was spent in counseling and/or coordination of care.      Shelly Coss, MD [facility] P11/08/2020, 11:29 AM

## 2021-04-24 NOTE — Consult Note (Signed)
Consultation Note Date: 04/24/2021   Patient Name: [name]  DOB: 15-Jul-1931  MRN: [id number]  Age / Sex: 87 y.o., female  PCP: Baxter Hire, MD Referring Physician: Shelly Coss, MD  Reason for Consultation: Assess and manage EOL comfort care  HPI/Patient Profile: 87 y.o. female  with past medical history of dementia, recent nontraumatic intracerebral hemorrhage, hypertension, CAD, and frailty admitted on 05/15/2021 with shortness of breath and cough.  Palliative medicine was consulted to assess and manage end-of-life comfort care.  Comfort care measures were put in place when patient presented in the ED on 11/1.  Clinical Assessment and Goals of Care: I have reviewed medical records including EPIC notes, labs and imaging, assessed the patient and then met with patient and her two nieces at bedside  to discuss diagnosis prognosis, GOC, EOL wishes, disposition and options.  I introduced Palliative Medicine as specialized medical care for people living with serious illness. It focuses on providing relief from the symptoms and stress of a serious illness. The goal is to improve quality of life for both the patient and the family.  We discussed a brief life review of the patient.  She was married and did not have any children.  She was in elementary school principal.  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  As far as functional and nutritional status the niece is endorsed that the patient had had a significant decline over the last week.  Nieces checked in on her and she was eating well and getting up with her walker with a caregiver by her side.  One knee shared that the patient was even trying foods that she would never have tried before, and enjoying them.  Both nieces share their surprised that the patient has reached end-of-life this  quickly since they knew her to be well just a few weeks ago.  We discussed patient's current illness and what it means in the larger context of patient's on-going co-morbidities.  I reiterated that with her history of a stroke and heart attack as well as her advanced age with dementia that there may not be one particular diagnosis that we could point to is the cause of her end-of-life. Natural disease trajectory and expectations at EOL were discussed.   Both nieces shared that they want make sure that she was out of pain.  I reviewed that and end-of-life we addressed symptoms such as pain, dyspnea, and agitation or anxiety.  The patient does not currently have any of these symptoms and in resting comfortably, mouthing breathing.   I reassured them by sharing that often as patient's transition to death we evaluate and address symptoms of shortness of breath, increased work of breathing, shallow respirations, gurgling sound, the furling of the brow.  I further educated the nieces that signs of impending death include mottling of the upper and lower extremities, hyperextension of the neck, drooping of the nasolabial folds, decreased reaction to verbal stimuli, inability to close the eyelids,  and grunting.  Patient is exhibiting hyperextension of the neck and drooping of the nasolabial folds as well as decreased reaction to verbal stimuli.   Nieces shared they would like to know what caused this.  I reviewed the medical record and evaluated that the patient has been diagnosed with acute respiratory failure.  They said they were aware of this diagnosis but that they just could not make sense of it since it all happened so quickly.  I educated them that dementia is a chronic progressive disease that sometimes has a quick trajectory at end-of-life.  Given she has a history of stroke and heart attack she has many factors contributing to her debility.  I shared that the patient no longer needs the nasal  cannula.  If she should become short of breath we can address her symptoms without the use of supplemental oxygen.  Research shows that supplemental oxygen prolongs life and does not actually help with shortness of breath.  I advised that if she become short of breath we will give medications as well as provide a fan to blow air on the patient's face.  Also advised that we no longer need to be checking the patient's oxygen saturation.  We know that her oxygen will drop as she is reaching end-of-life.  I assured the nieces that we will address pain, agitation, and shortness of breath with medications and other interventions but avoid prolonging her life with supplemental oxygen.  They were in agreement.  During our discussions the nurse entered the room.  I shared that we will titrate the patient off of the nasal cannula to room air and not apply supplemental oxygen to her again.  Nurse and family were in agreement.  Questions and concerns were addressed. The family was encouraged to call with questions or concerns.   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  HENT:     Head: Normocephalic and atraumatic.  Cardiovascular:     Rate and Rhythm: Normal rate.  Pulmonary:     Breath sounds: Examination of the right-lower field reveals decreased breath sounds. Examination of the left-lower field reveals decreased breath sounds. Decreased  breath sounds present.     Comments: Shallow respirations, mouth breathing, non gargling/secretions audible Skin:    General: Skin is warm and dry.  Neurological:     Comments: Non verbal  Psychiatric:        Behavior: Behavior is not agitated.    Vital Signs: BP 126/64   Pulse 82   Temp (!) 97.1 F (36.2 C) (Axillary)   Resp 19   Wt 42.8 kg   SpO2 93%   BMI 18.43 kg/m  Pain Scale: PAINAD     SpO2: SpO2: 93 % O2 Device:SpO2: 93 % O2 Flow Rate: .O2 Flow Rate (L/min): 10 L/min  Palliative Assessment/Data: 10%     I discussed this patient's plan of care with patient's HCPOA/niece, bedside RN, niece.  Thank you for this consult. Palliative medicine will continue to follow and assist  holistically.   Time Total: 70 minutes Greater than 50%  of this time was spent counseling and coordinating care related to the above assessment and plan.  Signed by: Jordan Hawks, DNP, FNP-BC Palliative Medicine    Please contact Palliative Medicine Team phone at [phone] for questions and concerns.  For individual provider: See Shea Evans

## 2021-04-24 NOTE — Plan of Care (Signed)
No acute events during the night. Nieces at bedside. Declined Q2 hour during the night. Comfort measure implemented. PRN ativan administered x 2. Purewick placed. O2 Via face mask. Dr. Sidney Ace messaged concerning order for face O2 via face mask. States that it is ok to place the order.

## 2021-04-25 ENCOUNTER — Other Ambulatory Visit (HOSPITAL_COMMUNITY): Payer: Self-pay

## 2021-04-27 LAB — CULTURE, BLOOD (ROUTINE X 2)
Culture: NO GROWTH
Special Requests: ADEQUATE

## 2021-05-12 ENCOUNTER — Ambulatory Visit: Payer: Medicare PPO

## 2021-05-22 NOTE — Death Summary Note (Addendum)
Death Summary  [name] CNO:[id number] DOB: 1931/08/12 DOA: 2021-05-19  PCP: April Hire, MD  Admit date: May 19, 2021 Date of Death: 22-May-2021 Time of Death: 0707    History of present illness:   [name] with history of dementia, recent nontraumatic intracerebral hemorrhage, hypertension, CAD who was brought to the emergency department with complaint of shortness of breath and cough.  She was hypoxic on room air on presentation.  She was agitated and combative.  She was put on under 100% nonrebreather on presentation.  Lab work showed leukocytosis, lactic acidosis.  Patient was found to be in acute respiratory failure with hypoxia.  Aspiration pneumonia suspected.  After extensive discussion on goals of care with family ,they decided to enroll into comfort care/hospice. She was started  on full comfort care. Palliative care was also following.  She expired in the early morning of 05-22-21  Final Diagnoses:  1.   Acute hypoxic respiratory failure secondary to suspected aspiration pneumonia   The results of significant diagnostics from this hospitalization (including imaging, microbiology, ancillary and laboratory) are listed below for reference.    Significant Diagnostic Studies: CT HEAD WO CONTRAST (5MM)  Result Date: 03/31/2021 CLINICAL DATA:  Head trauma EXAM: CT HEAD WITHOUT CONTRAST TECHNIQUE: Contiguous axial images were obtained from the base of the skull through the vertex without intravenous contrast. COMPARISON:  Brain MRI 11/01/2017, CT head 07/18/2019 FINDINGS: Brain: There is no evidence of acute intracranial hemorrhage, extra-axial fluid collection, or acute infarct. Mild encephalomalacia in the right cerebellar hemisphere is consistent with the prior intraparenchymal hematoma in this location. There is mild parenchymal volume loss with commensurate enlargement of the ventricular system. Foci of hypodensity in the subcortical and periventricular white  matter likely reflects sequela of advanced chronic white matter microangiopathy, unchanged. There is no mass lesion.  There is no midline shift. Vascular: There is calcification of the bilateral cavernous ICAs. Skull: Normal. Negative for fracture or focal lesion. Sinuses/Orbits: There is mild mucosal thickening in the ethmoid air cells. Bilateral lens implants are in place. The globes and orbits are otherwise unremarkable. Other: None. IMPRESSION: 1. No acute intracranial hemorrhage or calvarial fracture. 2. Unchanged global parenchymal volume loss and chronic white matter microangiopathy. Electronically Signed   By: Valetta Mole M.D.   On: 03/31/2021 16:31   CT Angio Chest Pulmonary Embolism (PE) W or WO Contrast  Result Date: 04/23/2021 CLINICAL DATA:  Encephalopathy EXAM: CT ANGIOGRAPHY CHEST WITH CONTRAST TECHNIQUE: Multidetector CT imaging of the chest was performed using the standard protocol during bolus administration of intravenous contrast. Multiplanar CT image reconstructions and MIPs were obtained to evaluate the vascular anatomy. CONTRAST:  48mL OMNIPAQUE IOHEXOL 350 MG/ML SOLN COMPARISON:  None. FINDINGS: Cardiovascular: Contrast injection is sufficient to demonstrate satisfactory opacification of the pulmonary arteries to the segmental level. There is no pulmonary embolus or evidence of right heart strain. The size of the main pulmonary artery is normal. Heart size is normal, with no pericardial effusion. Aortic caliber measures 3.8 cm just above the diaphragmatic hiatus. There is calcific aortic atherosclerosis. Mediastinum/Nodes: No mediastinal, hilar or axillary lymphadenopathy. Normal visualized thyroid. Thoracic esophageal course is normal. Lungs/Pleura: Bibasilar atelectasis.  Small pleural effusions. Upper Abdomen: Contrast bolus timing is not optimized for evaluation of the abdominal organs. The visualized portions of the organs of the upper abdomen are normal. Musculoskeletal: No chest  wall abnormality. No bony spinal canal stenosis. Review of the MIP images confirms the above findings. IMPRESSION: 1. No pulmonary  embolus or acute aortic syndrome. 2. Small pleural effusions and bibasilar atelectasis. Aortic Atherosclerosis (ICD10-I70.0). Electronically Signed   By: Ulyses Jarred M.D.   On: 04/23/2021 02:13   DG Chest Port 1 View  Result Date: 05/10/2021 CLINICAL DATA:  Questionable sepsis. EXAM: PORTABLE CHEST 1 VIEW COMPARISON:  Chest radiograph dated 07/18/2019. FINDINGS: Cardiomegaly with vascular congestion and edema. Bibasilar atelectasis or infiltrate. No pleural effusion pneumothorax. Atherosclerotic calcification of the aorta. No acute osseous pathology. IMPRESSION: 1. Cardiomegaly with vascular congestion and edema. 2. Bibasilar atelectasis or infiltrate. Electronically Signed   By: Anner Crete M.D.   On: 05/21/2021 19:04   ECHOCARDIOGRAM COMPLETE  Result Date: 04/01/2021    ECHOCARDIOGRAM REPORT   Patient Name:   [name] Date of Exam: 04/01/2021 Medical Rec #:  [id number]     Height:       60.0 in Accession #:    [id number]    Weight:       82.7 lb Date of Birth:  01-19-1932     BSA:          1.282 m Patient Age:    87 years      BP:           Not listed in chart/Not listed in                                             chart mmHg Patient Gender: F             HR:           Not listed in chart bpm. Exam Location:  [facility] Procedure: 2D Echo, Color Doppler and Cardiac Doppler Indications:     Syncope R55  History:         Patient has prior history of Echocardiogram examinations, most                  recent 10/29/2017. Previous Myocardial Infarction, TIA; Risk                  Factors:Hypertension.  Sonographer:     Sherrie Sport Referring Phys:  [id number] [place] [facility] Diagnosing Phys: Serafina Royals MD  Sonographer Comments: No parasternal window. Image acquisition challenging due to patient body habitus. IMPRESSIONS  1. Left ventricular ejection fraction, by estimation,  is 65 to 70%. The left ventricle has normal function. The left ventricle has no regional wall motion abnormalities. Left ventricular diastolic parameters were normal.  2. Right ventricular systolic function is normal. The right ventricular size is normal.  3. A small pericardial effusion is present.  4. The mitral valve is normal in structure. Trivial mitral valve regurgitation.  5. The aortic valve is normal in structure. Aortic valve regurgitation is trivial. FINDINGS  Left Ventricle: Left ventricular ejection fraction, by estimation, is 65 to 70%. The left ventricle has normal function. The left ventricle has no regional wall motion abnormalities. The left ventricular internal cavity size was small. There is no left ventricular hypertrophy. Left ventricular diastolic parameters were normal. Right Ventricle: The right ventricular size is normal. No increase in right ventricular wall thickness. Right ventricular systolic function is normal. Left Atrium: Left atrial size was normal in size. Right Atrium: Right atrial size was normal in size. Pericardium: A small pericardial effusion is present. Mitral Valve: The mitral valve is normal in structure. Trivial mitral valve  regurgitation. Tricuspid Valve: The tricuspid valve is normal in structure. Tricuspid valve regurgitation is trivial. Aortic Valve: The aortic valve is normal in structure. Aortic valve regurgitation is trivial. Aortic valve mean gradient measures 2.0 mmHg. Aortic valve peak gradient measures 4.7 mmHg. Aortic valve area, by VTI measures 2.46 cm. Pulmonic Valve: The pulmonic valve was normal in structure. Pulmonic valve regurgitation is not visualized. Aorta: The aortic root and ascending aorta are structurally normal, with no evidence of dilitation. IAS/Shunts: No atrial level shunt detected by color flow Doppler.  LEFT VENTRICLE PLAX 2D LVIDd:         2.50 cm   Diastology LVIDs:         1.70 cm   LV e' medial:    3.37 cm/s LV PW:         1.00 cm    LV E/e' medial:  13.4 LV IVS:        1.80 cm   LV e' lateral:   5.00 cm/s LVOT diam:     2.00 cm   LV E/e' lateral: 9.0 LV SV:         42 LV SV Index:   33 LVOT Area:     3.14 cm  RIGHT VENTRICLE RV S prime:     20.00 cm/s TAPSE (M-mode): 4.8 cm LEFT ATRIUM             Index        RIGHT ATRIUM           Index LA diam:        4.00 cm 3.12 cm/m   RA Area:     18.50 cm LA Vol (A2C):   42.9 ml 33.46 ml/m  RA Volume:   52.10 ml  40.63 ml/m LA Vol (A4C):   55.8 ml 43.52 ml/m LA Biplane Vol: 49.1 ml 38.29 ml/m  AORTIC VALVE AV Area (Vmax):    2.16 cm AV Area (Vmean):   2.44 cm AV Area (VTI):     2.46 cm AV Vmax:           108.00 cm/s AV Vmean:          68.700 cm/s AV VTI:            0.170 m AV Peak Grad:      4.7 mmHg AV Mean Grad:      2.0 mmHg LVOT Vmax:         74.40 cm/s LVOT Vmean:        53.300 cm/s LVOT VTI:          0.133 m LVOT/AV VTI ratio: 0.78 MITRAL VALVE               TRICUSPID VALVE MV Area (PHT): 3.93 cm    TR Peak grad:   11.8 mmHg MV Decel Time: 193 msec    TR Vmax:        172.00 cm/s MV E velocity: 45.00 cm/s MV A velocity: 84.80 cm/s  SHUNTS MV E/A ratio:  0.53        Systemic VTI:  0.13 m                            Systemic Diam: 2.00 cm Serafina Royals MD Electronically signed by Serafina Royals MD Signature Date/Time: 04/01/2021/4:38:19 PM    Final     Microbiology: Recent Results (from the past 240 hour(s))  Blood Culture (routine x 2)     Status:  None (Preliminary result)   Collection Time: 05/01/2021  7:55 PM   Specimen: BLOOD  Result Value Ref Range Status   Specimen Description BLOOD LEFT ANTECUBITAL  Final   Special Requests   Final    BOTTLES DRAWN AEROBIC AND ANAEROBIC Blood Culture adequate volume   Culture   Final    NO GROWTH 3 DAYS Performed at [facility], [address] [place], [place] [address]    Report Status PENDING  Incomplete  Resp Panel by RT-PCR (Flu A&B, Covid) Nasopharyngeal Swab     Status: None   Collection Time: 04/27/2021  9:04 PM    Specimen: Nasopharyngeal Swab; Nasopharyngeal(NP) swabs in vial transport medium  Result Value Ref Range Status   SARS Coronavirus 2 by RT PCR NEGATIVE NEGATIVE Final    Comment: (NOTE) SARS-CoV-2 target nucleic acids are NOT DETECTED.  The SARS-CoV-2 RNA is generally detectable in upper respiratory specimens during the acute phase of infection. The lowest concentration of SARS-CoV-2 viral copies this assay can detect is 138 copies/mL. A negative result does not preclude SARS-Cov-2 infection and should not be used as the sole basis for treatment or other patient management decisions. A negative result may occur with  improper specimen collection/handling, submission of specimen other than nasopharyngeal swab, presence of viral mutation(s) within the areas targeted by this assay, and inadequate number of viral copies(<138 copies/mL). A negative result must be combined with clinical observations, patient history, and epidemiological information. The expected result is Negative.  Fact Sheet for Patients:  EntrepreneurPulse.com.au  Fact Sheet for Healthcare Providers:  IncredibleEmployment.be  This test is no t yet approved or cleared by the [place] FDA and  has been authorized for detection and/or diagnosis of SARS-CoV-2 by FDA under an Emergency Use Authorization (EUA). This EUA will remain  in effect (meaning this test can be used) for the duration of the COVID-19 declaration under Section 564(b)(1) of the Act, 21 U.S.C.section 360bbb-3(b)(1), unless the authorization is terminated  or revoked sooner.       Influenza A by PCR NEGATIVE NEGATIVE Final   Influenza B by PCR NEGATIVE NEGATIVE Final    Comment: (NOTE) The Xpert Xpress SARS-CoV-2/FLU/RSV plus assay is intended as an aid in the diagnosis of influenza from Nasopharyngeal swab specimens and should not be used as a sole basis for treatment. Nasal washings and aspirates are  unacceptable for Xpert Xpress SARS-CoV-2/FLU/RSV testing.  Fact Sheet for Patients: EntrepreneurPulse.com.au  Fact Sheet for Healthcare Providers: IncredibleEmployment.be  This test is not yet approved or cleared by the [place] FDA and has been authorized for detection and/or diagnosis of SARS-CoV-2 by FDA under an Emergency Use Authorization (EUA). This EUA will remain in effect (meaning this test can be used) for the duration of the COVID-19 declaration under Section 564(b)(1) of the Act, 21 U.S.C. section 360bbb-3(b)(1), unless the authorization is terminated or revoked.  Performed at [facility], [address] [place],  [address]      Labs: Basic Metabolic Panel: Recent Labs  Lab 04/24/2021 1955 04/23/21 0625  NA 141 141  K 4.5 4.3  CL 99 104  CO2 30 24  GLUCOSE 75 137*  BUN 33* 34*  CREATININE 1.23* 1.37*  CALCIUM 9.5 8.2*   Liver Function Tests: Recent Labs  Lab 05/19/2021 1955 04/23/21 0625  AST 38 43*  ALT 37 39  ALKPHOS 81 65  BILITOT 1.5* 1.1  PROT 6.5 5.2*  ALBUMIN 3.7 2.9*   No results for input(s): LIPASE,  AMYLASE in the last 168 hours. No results for input(s): AMMONIA in the last 168 hours. CBC: Recent Labs  Lab 05/12/2021 1955 04/23/21 0625  WBC 18.9* 19.3*  NEUTROABS 16.0*  --   HGB 13.0 10.8*  HCT 38.9 32.9*  MCV 120.1* 123.7*  PLT 1,154* 816*   Cardiac Enzymes: Recent Labs  Lab 04/23/21 0625  CKTOTAL 40   D-Dimer No results for input(s): DDIMER in the last 72 hours. BNP: Invalid input(s): POCBNP CBG: No results for input(s): GLUCAP in the last 168 hours. Anemia work up No results for input(s): VITAMINB12, FOLATE, FERRITIN, TIBC, IRON, RETICCTPCT in the last 72 hours. Urinalysis    Component Value Date/Time   COLORURINE YELLOW (A) 04/23/2021 0026   APPEARANCEUR CLEAR (A) 04/23/2021 0026   APPEARANCEUR Clear 11/10/2012 1347   LABSPEC 1.014 04/23/2021 0026    LABSPEC 1.012 11/10/2012 1347   PHURINE 5.0 04/23/2021 0026   GLUCOSEU NEGATIVE 04/23/2021 0026   GLUCOSEU Negative 11/10/2012 1347   HGBUR NEGATIVE 04/23/2021 0026   BILIRUBINUR NEGATIVE 04/23/2021 0026   BILIRUBINUR Negative 11/10/2012 1347   KETONESUR NEGATIVE 04/23/2021 0026   PROTEINUR 30 (A) 04/23/2021 0026   NITRITE NEGATIVE 04/23/2021 0026   LEUKOCYTESUR NEGATIVE 04/23/2021 0026   LEUKOCYTESUR 1+ 11/10/2012 1347   Sepsis Labs Invalid input(s): PROCALCITONIN,  WBC,  LACTICIDVEN     SIGNED:  Shelly Coss, MD  [facility] 05-01-2021, 1:25 PM Pager [phone]  If 7PM-7AM, please contact night-coverage [email or web address] Password [email or web address]

## 2021-05-22 NOTE — Progress Notes (Signed)
This nurse along with Ginger Madison,RN walked into the patient's room during morning report. Upon walking into the room, family is at the bedside and tearful. The patient's niece inform us that the patient has passed. Ginger and I verified no signs of life as no rise and fall of the chest is noted and no pulse is found. This RN pronounces patient at [phone]. Family remains at bedside. Providers are notified of the occurrence by Ginger,RN.

## 2021-05-22 DEATH — deceased

## 2021-05-23 NOTE — Telephone Encounter (Signed)
See previous note from 12/16/20, signing note

## 2021-06-03 ENCOUNTER — Ambulatory Visit: Payer: Medicare PPO

## 2021-06-18 ENCOUNTER — Other Ambulatory Visit: Payer: Medicare PPO

## 2021-06-18 ENCOUNTER — Ambulatory Visit: Payer: Medicare PPO | Admitting: Oncology

## 2021-08-04 ENCOUNTER — Encounter: Payer: Self-pay | Admitting: Hematology and Oncology

## 2021-08-27 ENCOUNTER — Ambulatory Visit: Payer: Medicare PPO | Admitting: Adult Health
